# Patient Record
Sex: Female | Born: 1980 | Race: White | Hispanic: No | Marital: Single | Smoking: Current every day smoker
Health system: Southern US, Community
[De-identification: ages and names within clinical notes are randomized; demographics above are authoritative.]

## PROBLEM LIST (undated history)

## (undated) ENCOUNTER — Ambulatory Visit

## (undated) ENCOUNTER — Encounter
Attending: Student in an Organized Health Care Education/Training Program | Primary: Student in an Organized Health Care Education/Training Program

## (undated) ENCOUNTER — Encounter

## (undated) ENCOUNTER — Encounter: Attending: Internal Medicine | Primary: Internal Medicine

## (undated) ENCOUNTER — Telehealth

## (undated) ENCOUNTER — Telehealth
Attending: Student in an Organized Health Care Education/Training Program | Primary: Student in an Organized Health Care Education/Training Program

## (undated) ENCOUNTER — Encounter: Attending: School | Primary: School

## (undated) ENCOUNTER — Encounter: Attending: Hematology & Oncology | Primary: Hematology & Oncology

## (undated) ENCOUNTER — Ambulatory Visit: Payer: BLUE CROSS/BLUE SHIELD

## (undated) ENCOUNTER — Ambulatory Visit: Payer: Medicaid (Managed Care) | Attending: Hematology & Oncology | Primary: Hematology & Oncology

## (undated) ENCOUNTER — Ambulatory Visit: Payer: MEDICAID | Attending: Clinical | Primary: Clinical

## (undated) ENCOUNTER — Telehealth: Attending: Hematology & Oncology | Primary: Hematology & Oncology

## (undated) ENCOUNTER — Ambulatory Visit: Payer: MEDICAID

## (undated) ENCOUNTER — Telehealth: Attending: Internal Medicine | Primary: Internal Medicine

## (undated) ENCOUNTER — Ambulatory Visit
Payer: Medicaid (Managed Care) | Attending: Student in an Organized Health Care Education/Training Program | Primary: Student in an Organized Health Care Education/Training Program

## (undated) ENCOUNTER — Ambulatory Visit: Payer: BLUE CROSS/BLUE SHIELD | Attending: School | Primary: School

## (undated) ENCOUNTER — Telehealth: Attending: Clinical | Primary: Clinical

## (undated) ENCOUNTER — Ambulatory Visit: Attending: Psychiatry | Primary: Psychiatry

## (undated) ENCOUNTER — Telehealth: Attending: Social Worker | Primary: Social Worker

## (undated) ENCOUNTER — Encounter: Attending: Gastroenterology | Primary: Gastroenterology

## (undated) ENCOUNTER — Ambulatory Visit: Payer: Worker's Compensation

## (undated) ENCOUNTER — Encounter: Payer: Medicaid (Managed Care) | Attending: School | Primary: School

## (undated) ENCOUNTER — Encounter: Attending: Family | Primary: Family

## (undated) ENCOUNTER — Inpatient Hospital Stay: Payer: Medicaid (Managed Care)

## (undated) ENCOUNTER — Encounter: Attending: Physician Assistant | Primary: Physician Assistant

## (undated) ENCOUNTER — Ambulatory Visit: Attending: Pharmacist | Primary: Pharmacist

## (undated) ENCOUNTER — Encounter: Attending: Dermatology | Primary: Dermatology

## (undated) ENCOUNTER — Encounter: Attending: Registered" | Primary: Registered"

## (undated) ENCOUNTER — Ambulatory Visit: Payer: Medicaid (Managed Care) | Attending: Internal Medicine | Primary: Internal Medicine

## (undated) ENCOUNTER — Encounter: Attending: Surgery | Primary: Surgery

## (undated) ENCOUNTER — Ambulatory Visit: Payer: Medicaid (Managed Care) | Attending: School | Primary: School

## (undated) ENCOUNTER — Encounter: Attending: Registered Nurse | Primary: Registered Nurse

## (undated) ENCOUNTER — Ambulatory Visit: Payer: MEDICAID | Attending: Internal Medicine | Primary: Internal Medicine

## (undated) ENCOUNTER — Ambulatory Visit: Payer: Medicaid (Managed Care)

## (undated) ENCOUNTER — Ambulatory Visit: Payer: Medicaid (Managed Care) | Attending: Nurse Practitioner | Primary: Nurse Practitioner

## (undated) ENCOUNTER — Ambulatory Visit: Attending: Internal Medicine | Primary: Internal Medicine

## (undated) ENCOUNTER — Telehealth: Attending: Mental Health | Primary: Mental Health

## (undated) ENCOUNTER — Encounter: Attending: Orthopaedic Trauma | Primary: Orthopaedic Trauma

## (undated) ENCOUNTER — Ambulatory Visit
Payer: MEDICAID | Attending: Student in an Organized Health Care Education/Training Program | Primary: Student in an Organized Health Care Education/Training Program

## (undated) ENCOUNTER — Telehealth: Attending: Family | Primary: Family

## (undated) ENCOUNTER — Encounter: Attending: Clinical | Primary: Clinical

## (undated) ENCOUNTER — Encounter: Attending: Anesthesiology | Primary: Anesthesiology

## (undated) ENCOUNTER — Encounter: Attending: Nurse Practitioner | Primary: Nurse Practitioner

## (undated) ENCOUNTER — Telehealth: Attending: School | Primary: School

## (undated) ENCOUNTER — Telehealth: Attending: Nurse Practitioner | Primary: Nurse Practitioner

## (undated) ENCOUNTER — Telehealth: Attending: Marriage & Family Therapist | Primary: Marriage & Family Therapist

## (undated) ENCOUNTER — Ambulatory Visit
Payer: BLUE CROSS/BLUE SHIELD | Attending: Student in an Organized Health Care Education/Training Program | Primary: Student in an Organized Health Care Education/Training Program

## (undated) ENCOUNTER — Ambulatory Visit: Payer: BLUE CROSS/BLUE SHIELD | Attending: Clinical | Primary: Clinical

## (undated) ENCOUNTER — Encounter: Attending: Professional | Primary: Professional

## (undated) ENCOUNTER — Encounter: Attending: Rheumatology | Primary: Rheumatology

## (undated) ENCOUNTER — Ambulatory Visit: Payer: BLUE CROSS/BLUE SHIELD | Attending: Hematology & Oncology | Primary: Hematology & Oncology

## (undated) ENCOUNTER — Encounter: Attending: Marriage & Family Therapist | Primary: Marriage & Family Therapist

## (undated) ENCOUNTER — Inpatient Hospital Stay

## (undated) ENCOUNTER — Telehealth
Payer: BLUE CROSS/BLUE SHIELD | Attending: Student in an Organized Health Care Education/Training Program | Primary: Student in an Organized Health Care Education/Training Program

## (undated) ENCOUNTER — Ambulatory Visit: Payer: BLUE CROSS/BLUE SHIELD | Attending: Psychologist | Primary: Psychologist

## (undated) ENCOUNTER — Ambulatory Visit: Payer: BLUE CROSS/BLUE SHIELD | Attending: Professional | Primary: Professional

## (undated) ENCOUNTER — Ambulatory Visit: Payer: BLUE CROSS/BLUE SHIELD | Attending: Physician Assistant | Primary: Physician Assistant

## (undated) ENCOUNTER — Ambulatory Visit
Attending: Student in an Organized Health Care Education/Training Program | Primary: Student in an Organized Health Care Education/Training Program

## (undated) ENCOUNTER — Ambulatory Visit: Attending: School | Primary: School

## (undated) ENCOUNTER — Telehealth: Attending: Physician Assistant | Primary: Physician Assistant

## (undated) ENCOUNTER — Inpatient Hospital Stay: Payer: Worker's Compensation

## (undated) ENCOUNTER — Ambulatory Visit: Payer: MEDICAID | Attending: Surgery | Primary: Surgery

## (undated) DIAGNOSIS — L259 Unspecified contact dermatitis, unspecified cause: Secondary | ICD-10-CM

## (undated) DIAGNOSIS — F988 Other specified behavioral and emotional disorders with onset usually occurring in childhood and adolescence: Secondary | ICD-10-CM

## (undated) DIAGNOSIS — J189 Pneumonia, unspecified organism: Secondary | ICD-10-CM

## (undated) DIAGNOSIS — M797 Fibromyalgia: Secondary | ICD-10-CM

## (undated) DIAGNOSIS — K5190 Ulcerative colitis, unspecified, without complications: Secondary | ICD-10-CM

## (undated) DIAGNOSIS — F411 Generalized anxiety disorder: Secondary | ICD-10-CM

## (undated) DIAGNOSIS — G8929 Other chronic pain: Secondary | ICD-10-CM

## (undated) DIAGNOSIS — F909 Attention-deficit hyperactivity disorder, unspecified type: Secondary | ICD-10-CM

## (undated) DIAGNOSIS — I10 Essential (primary) hypertension: Secondary | ICD-10-CM

## (undated) DIAGNOSIS — M76899 Other specified enthesopathies of unspecified lower limb, excluding foot: Secondary | ICD-10-CM

## (undated) DIAGNOSIS — E519 Thiamine deficiency, unspecified: Secondary | ICD-10-CM

## (undated) DIAGNOSIS — D649 Anemia, unspecified: Secondary | ICD-10-CM

## (undated) DIAGNOSIS — G43009 Migraine without aura, not intractable, without status migrainosus: Secondary | ICD-10-CM

## (undated) DIAGNOSIS — M542 Cervicalgia: Secondary | ICD-10-CM

## (undated) DIAGNOSIS — H6690 Otitis media, unspecified, unspecified ear: Secondary | ICD-10-CM

## (undated) DIAGNOSIS — M1990 Unspecified osteoarthritis, unspecified site: Secondary | ICD-10-CM

## (undated) DIAGNOSIS — F17200 Nicotine dependence, unspecified, uncomplicated: Secondary | ICD-10-CM

## (undated) DIAGNOSIS — Z8719 Personal history of other diseases of the digestive system: Secondary | ICD-10-CM

## (undated) DIAGNOSIS — R109 Unspecified abdominal pain: Secondary | ICD-10-CM

## (undated) DIAGNOSIS — R51 Headache: Secondary | ICD-10-CM

## (undated) DIAGNOSIS — J40 Bronchitis, not specified as acute or chronic: Secondary | ICD-10-CM

## (undated) DIAGNOSIS — L719 Rosacea, unspecified: Secondary | ICD-10-CM

## (undated) DIAGNOSIS — E538 Deficiency of other specified B group vitamins: Secondary | ICD-10-CM

## (undated) DIAGNOSIS — L03119 Cellulitis of unspecified part of limb: Secondary | ICD-10-CM

## (undated) DIAGNOSIS — K219 Gastro-esophageal reflux disease without esophagitis: Secondary | ICD-10-CM

## (undated) DIAGNOSIS — L02419 Cutaneous abscess of limb, unspecified: Secondary | ICD-10-CM

## (undated) DIAGNOSIS — R062 Wheezing: Secondary | ICD-10-CM

## (undated) DIAGNOSIS — J309 Allergic rhinitis, unspecified: Secondary | ICD-10-CM

## (undated) DIAGNOSIS — R5383 Other fatigue: Secondary | ICD-10-CM

## (undated) DIAGNOSIS — M549 Dorsalgia, unspecified: Secondary | ICD-10-CM

## (undated) DIAGNOSIS — K5000 Crohn's disease of small intestine without complications: Secondary | ICD-10-CM

## (undated) HISTORY — DX: Ulcerative colitis, unspecified, without complications: K51.90

## (undated) HISTORY — DX: Other specified enthesopathies of unspecified lower limb, excluding foot: M76.899

## (undated) HISTORY — DX: Generalized anxiety disorder: F41.1

## (undated) HISTORY — DX: Deficiency of other specified B group vitamins: E53.8

## (undated) HISTORY — DX: Other fatigue: R53.83

## (undated) HISTORY — DX: Cutaneous abscess of limb, unspecified: L02.419

## (undated) HISTORY — DX: Unspecified contact dermatitis, unspecified cause: L25.9

## (undated) HISTORY — DX: Gastro-esophageal reflux disease without esophagitis: K21.9

## (undated) HISTORY — DX: Nicotine dependence, unspecified, uncomplicated: F17.200

## (undated) HISTORY — DX: Cervicalgia: M54.2

## (undated) HISTORY — DX: Unspecified abdominal pain: R10.9

## (undated) HISTORY — DX: Rosacea, unspecified: L71.9

## (undated) HISTORY — DX: Cellulitis of unspecified part of limb: L03.119

## (undated) HISTORY — DX: Dorsalgia, unspecified: M54.9

## (undated) HISTORY — DX: Wheezing: R06.2

## (undated) HISTORY — DX: Thiamine deficiency, unspecified: E51.9

## (undated) HISTORY — DX: Other chronic pain: G89.29

## (undated) HISTORY — DX: Essential (primary) hypertension: I10

## (undated) HISTORY — DX: Other specified behavioral and emotional disorders with onset usually occurring in childhood and adolescence: F98.8

## (undated) HISTORY — DX: Otitis media, unspecified, unspecified ear: H66.90

## (undated) HISTORY — DX: Headache: R51

## (undated) HISTORY — DX: Migraine without aura, not intractable, without status migrainosus: G43.009

## (undated) HISTORY — DX: Attention-deficit hyperactivity disorder, unspecified type: F90.9

## (undated) HISTORY — DX: Allergic rhinitis, unspecified: J30.9

## (undated) HISTORY — DX: Crohn's disease of small intestine without complications: K50.00

## (undated) MED ORDER — ASPIRIN-ACETAMINOPHEN-CAFFEINE 250 MG-250 MG-65 MG TABLET: Freq: Four times a day (QID) | ORAL | 0 days | PRN

## (undated) MED ORDER — MAGNESIUM OXIDE 400 MG (241.3 MG MAGNESIUM) TABLET: ORAL | 0.00000 days

## (undated) MED ORDER — BACLOFEN 20 MG TABLET: Freq: Three times a day (TID) | ORAL | 0.00000 days

## (undated) MED ORDER — TETRAHYDROZOLINE 0.05 %-ZINC 0.25 % EYE DROPS: Freq: Once | 0.00000 days | PRN

## (undated) MED ORDER — PRAMIPEXOLE 1 MG TABLET: Freq: Every evening | ORAL | 0 days

## (undated) MED ORDER — LEVETIRACETAM 500 MG TABLET: Freq: Two times a day (BID) | ORAL | 0 days

## (undated) MED ORDER — DEXTROAMPHETAMINE-AMPHETAMINE 20 MG TABLET: 20 mg | tablet | Freq: Two times a day (BID) | 0 refills | 30 days | Status: CN

## (undated) MED ORDER — CYCLOBENZAPRINE 10 MG TABLET: Freq: Three times a day (TID) | ORAL | 0.00000 days | PRN

## (undated) MED ORDER — OXYCODONE 5 MG CAPSULE: Freq: Three times a day (TID) | ORAL | 0 days | PRN

---
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
## 2000-11-21 ENCOUNTER — Encounter: Admission: RE | Admit: 2000-11-21 | Discharge: 2000-11-21 | Payer: Self-pay | Admitting: Specialist

## 2000-11-21 ENCOUNTER — Encounter: Payer: Self-pay | Admitting: Specialist

## 2000-11-23 ENCOUNTER — Encounter: Admission: RE | Admit: 2000-11-23 | Discharge: 2000-11-23 | Payer: Self-pay | Admitting: Specialist

## 2000-11-23 ENCOUNTER — Encounter: Payer: Self-pay | Admitting: Specialist

## 2001-03-02 ENCOUNTER — Encounter: Admission: RE | Admit: 2001-03-02 | Discharge: 2001-05-31 | Payer: Self-pay | Admitting: Family Medicine

## 2001-08-22 ENCOUNTER — Encounter (INDEPENDENT_AMBULATORY_CARE_PROVIDER_SITE_OTHER): Payer: Self-pay | Admitting: Specialist

## 2001-08-22 ENCOUNTER — Encounter: Payer: Self-pay | Admitting: Gastroenterology

## 2001-08-22 ENCOUNTER — Ambulatory Visit (HOSPITAL_COMMUNITY): Admission: RE | Admit: 2001-08-22 | Discharge: 2001-08-22 | Payer: Self-pay | Admitting: Gastroenterology

## 2002-08-08 HISTORY — PX: AUGMENTATION MAMMAPLASTY: SUR837

## 2003-06-09 ENCOUNTER — Other Ambulatory Visit: Admission: RE | Admit: 2003-06-09 | Discharge: 2003-06-09 | Payer: Self-pay | Admitting: Obstetrics and Gynecology

## 2005-07-18 ENCOUNTER — Emergency Department (HOSPITAL_COMMUNITY): Admission: EM | Admit: 2005-07-18 | Discharge: 2005-07-18 | Payer: Self-pay | Admitting: Emergency Medicine

## 2008-01-30 ENCOUNTER — Emergency Department (HOSPITAL_COMMUNITY): Admission: EM | Admit: 2008-01-30 | Discharge: 2008-01-31 | Payer: Self-pay | Admitting: Emergency Medicine

## 2008-01-31 ENCOUNTER — Emergency Department (HOSPITAL_COMMUNITY): Admission: EM | Admit: 2008-01-31 | Discharge: 2008-01-31 | Payer: Self-pay | Admitting: Emergency Medicine

## 2008-01-31 IMAGING — CT CT HEAD W/O CM
1 series · 16 of 30 positions shown, 20 images · non-contrast
Comparison: None

CLINICAL DATA: Left facial numbness

CT HEAD WITHOUT CONTRAST
TECHNIQUE: Contiguous axial images were obtained from the base of
the skull through the vertex without contrast.

[Series 2: headseq 4.8 h37s · axial · 0.45mm/px · z∈[+140,+300]mm · 16 of 36 slices shown, 20 images]
[im 2/36  brain]
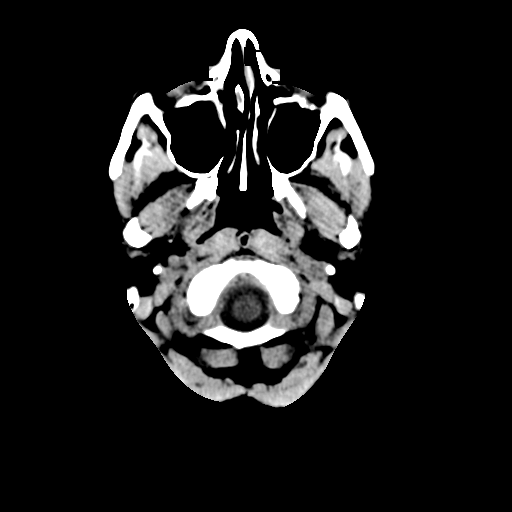
[im 2/36  bone]
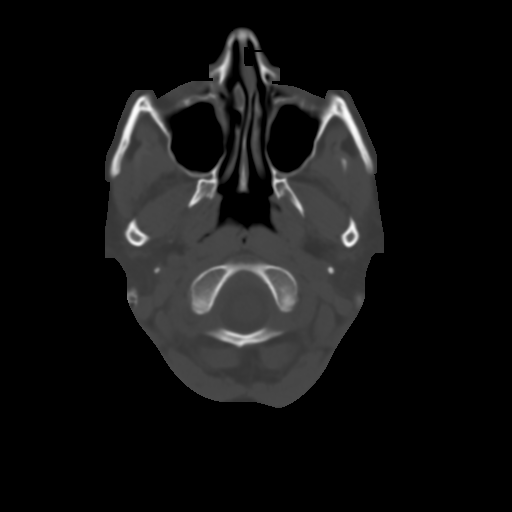
[im 4/36  brain]
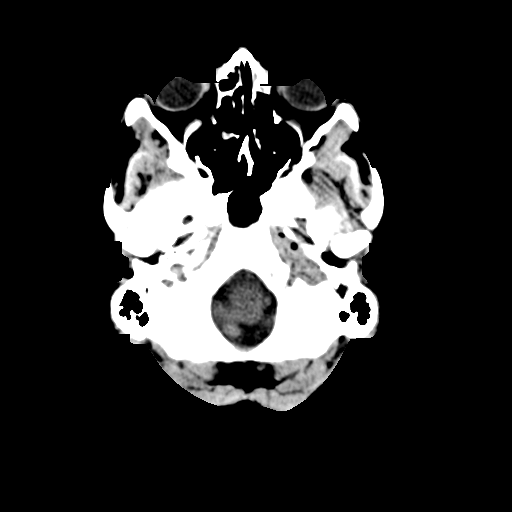
[im 7/36  brain]
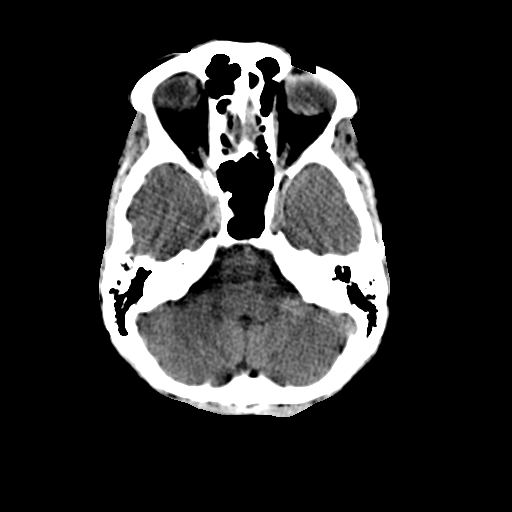
[im 9/36  brain]
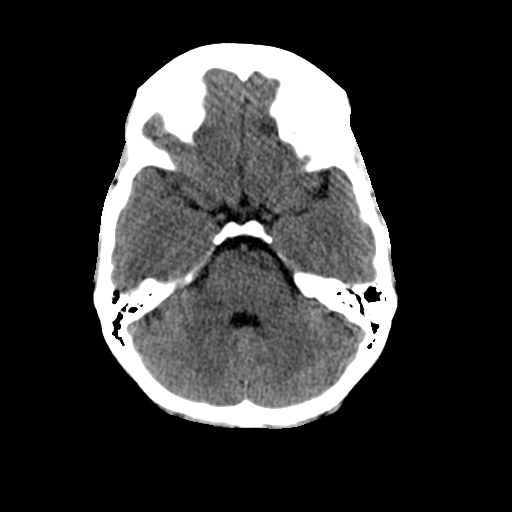
[im 10/36  brain]
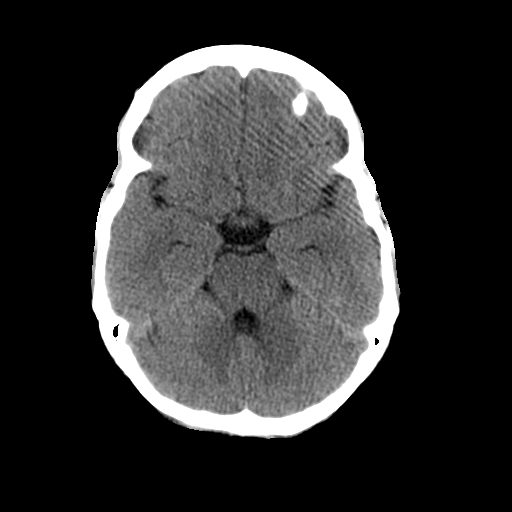
[im 10/36  bone]
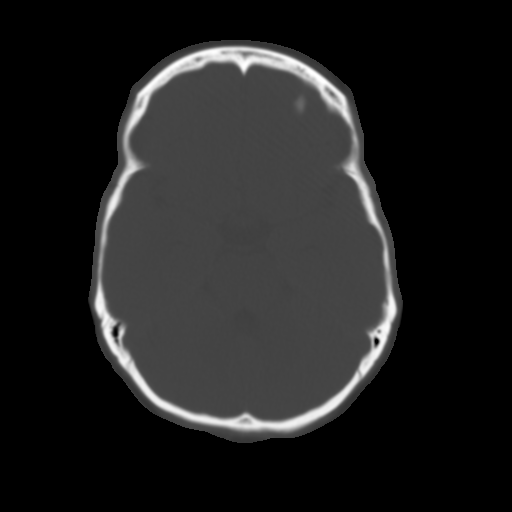
[im 13/36  brain]
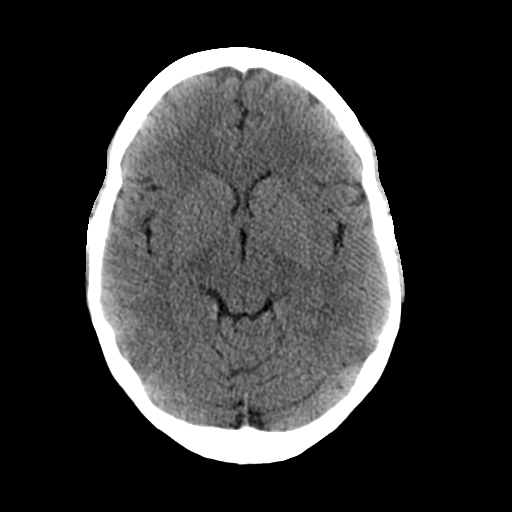
[im 15/36  brain]
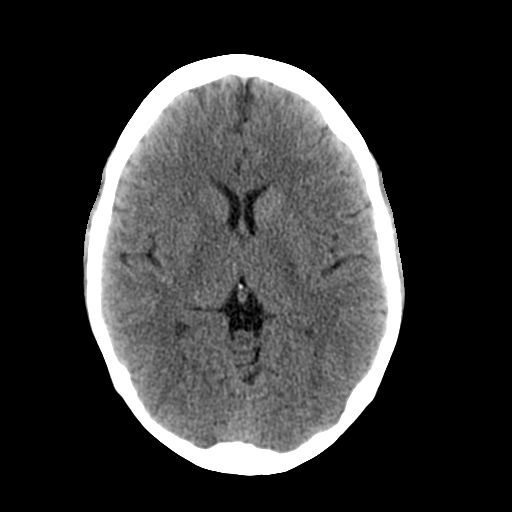
[im 17/36  brain]
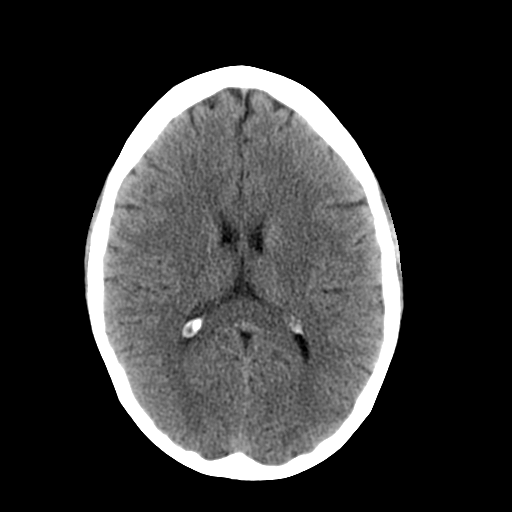
[im 19/36  brain]
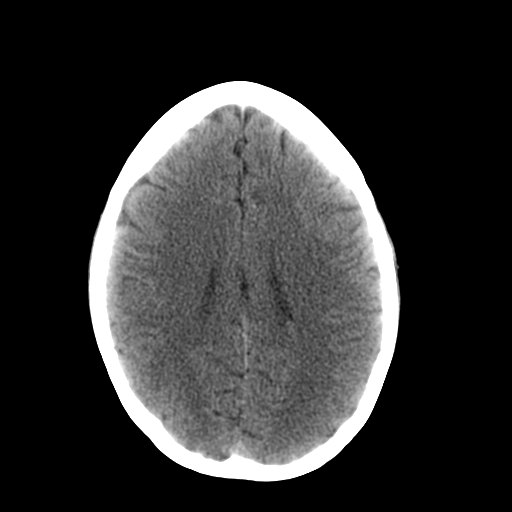
[im 19/36  bone]
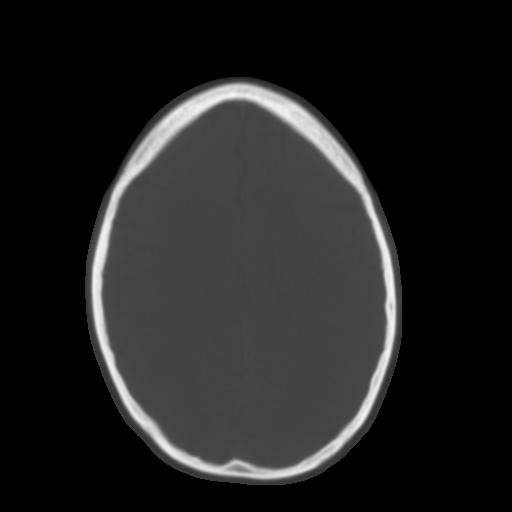
[im 21/36  brain]
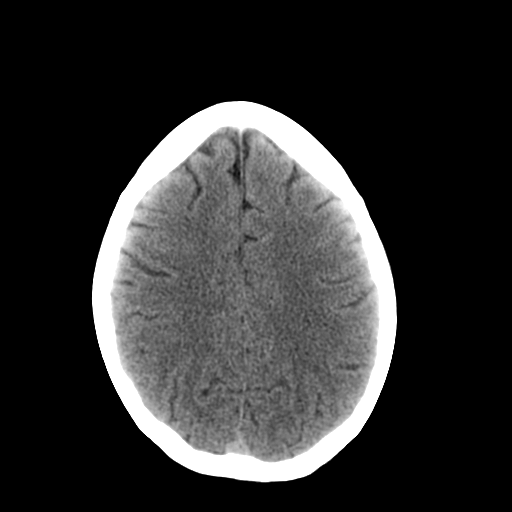
[im 23/36  brain]
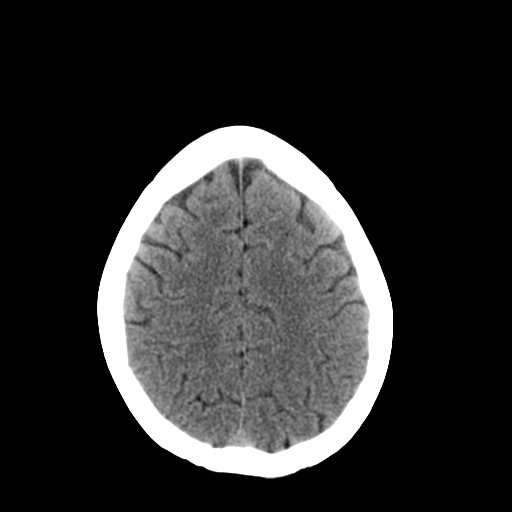
[im 26/36  brain]
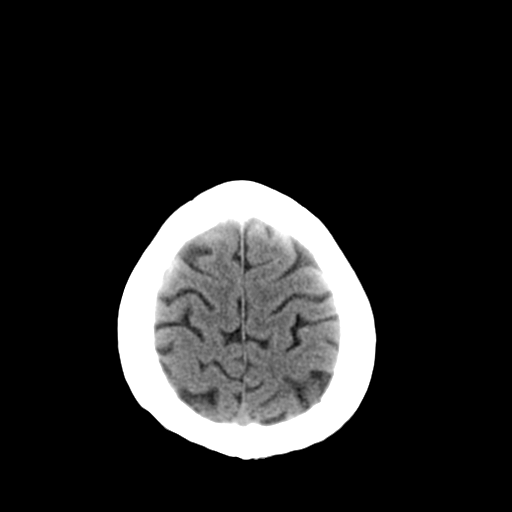
[im 27/36  brain]
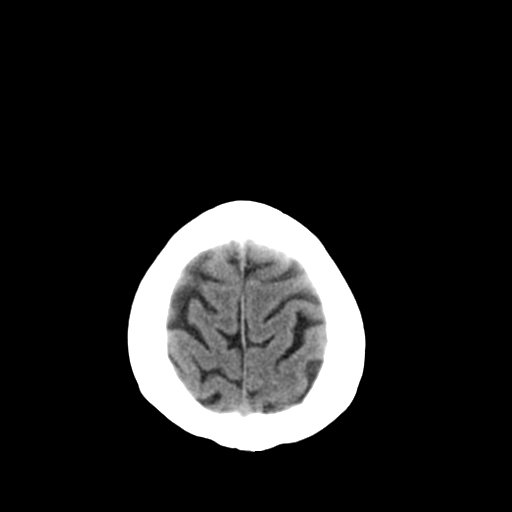
[im 27/36  bone]
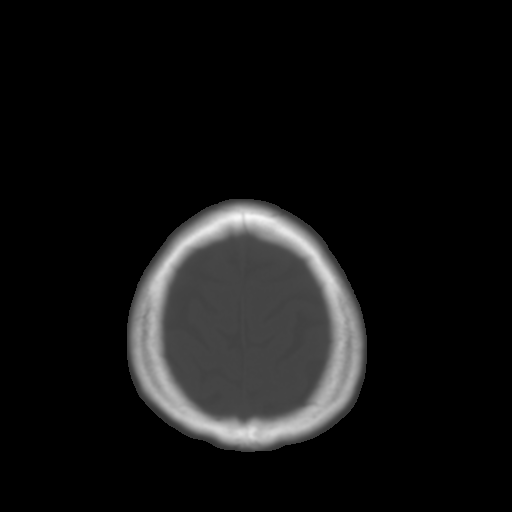
[im 29/36  brain]
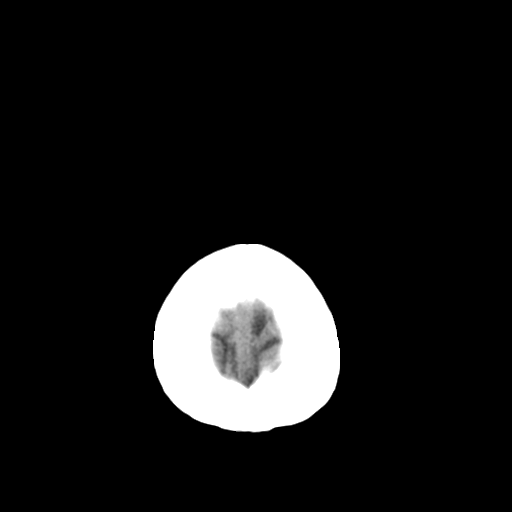
[im 32/36  brain]
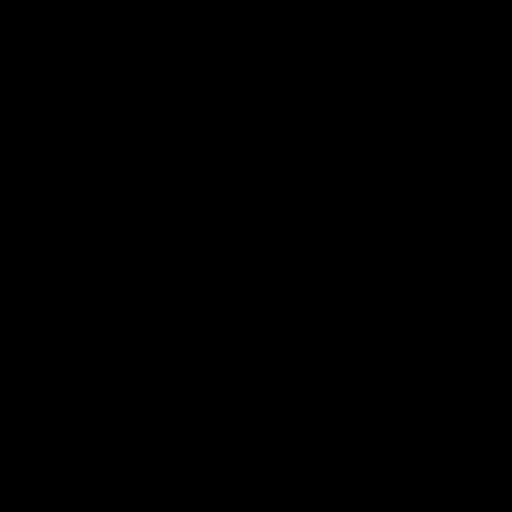
[im 34/36  brain]
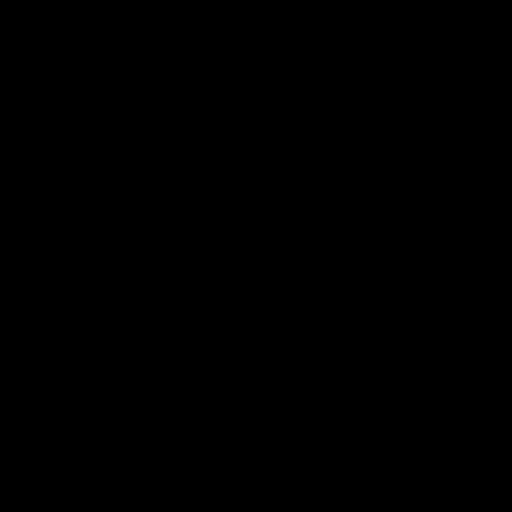

[16 of 30 positions shown; findings below may reference images not displayed]

FINDINGS: The ventricles are in the midline without mass effect or
shift.  They are normal in size and configuration.  No extra-axial
fluid collections are seen.  No acute intracranial findings and no
mass lesions.  The brainstem and cerebellum are unremarkable.

The bony calvarium is intact.  The visualized paranasal sinuses and
mastoid air cells are grossly clear.  Minimal ethmoid disease.  The
globes are intact.
IMPRESSION: 1.  No acute intracranial findings and no mass lesions.
2.  Intact bony calvarium.

## 2008-11-07 ENCOUNTER — Inpatient Hospital Stay (HOSPITAL_COMMUNITY): Admission: AD | Admit: 2008-11-07 | Discharge: 2008-11-09 | Payer: Self-pay | Admitting: Obstetrics & Gynecology

## 2009-01-06 HISTORY — PX: ENDOMETRIAL ABLATION: SHX621

## 2009-01-18 ENCOUNTER — Emergency Department (HOSPITAL_COMMUNITY): Admission: EM | Admit: 2009-01-18 | Discharge: 2009-01-18 | Payer: Self-pay | Admitting: Family Medicine

## 2009-02-03 ENCOUNTER — Ambulatory Visit (HOSPITAL_COMMUNITY): Admission: RE | Admit: 2009-02-03 | Discharge: 2009-02-03 | Payer: Self-pay | Admitting: Obstetrics and Gynecology

## 2009-03-19 ENCOUNTER — Emergency Department (HOSPITAL_COMMUNITY): Admission: EM | Admit: 2009-03-19 | Discharge: 2009-03-19 | Payer: Self-pay | Admitting: Emergency Medicine

## 2009-03-19 IMAGING — CT CT ABDOMEN W/ CM
2 of 4 series · 17 of 46 positions shown, 19 images · IV contrast (water/omni  & 80 ml omni 300)
Comparison: None

CT ABDOMEN

CLINICAL DATA: Mid abdominal pain, nausea, vomiting, diarrhea for
approximately 4 months

CT ABDOMEN AND PELVIS WITH CONTRAST
TECHNIQUE: Multidetector CT imaging of the abdomen and pelvis was
performed using the standard protocol following bolus
administration of intravenous contrast.
Contrast: 80 ml [73]

[Series 2: routine abdomen · axial · 0.71mm/px · z∈[-445,-15]mm · 14 of 96 slices shown, 16 images]
[im 5/96  soft-tissue]
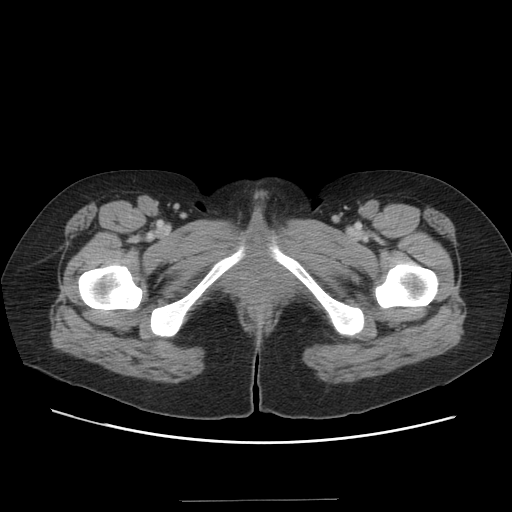
[im 5/96  bone]
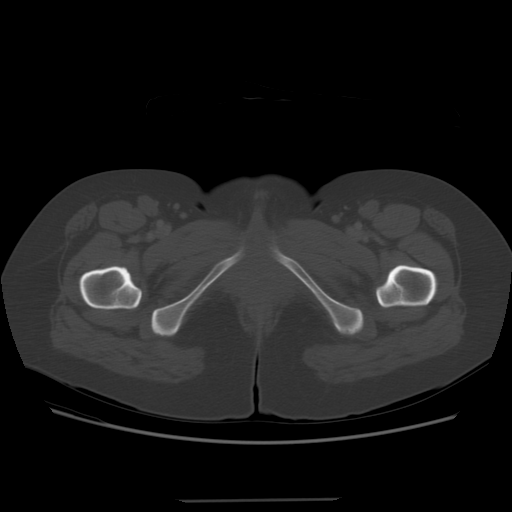
[im 13/96  soft-tissue]
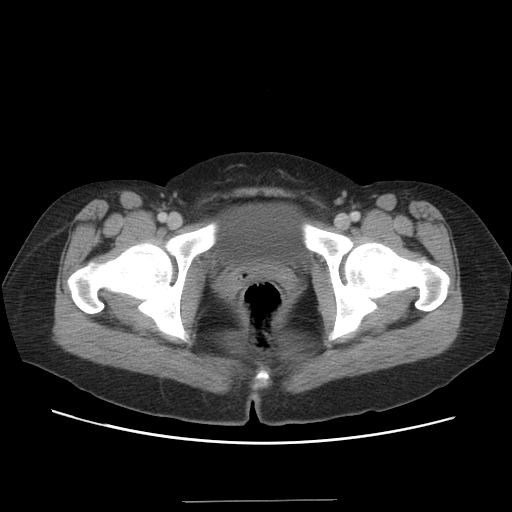
[im 18/96  soft-tissue]
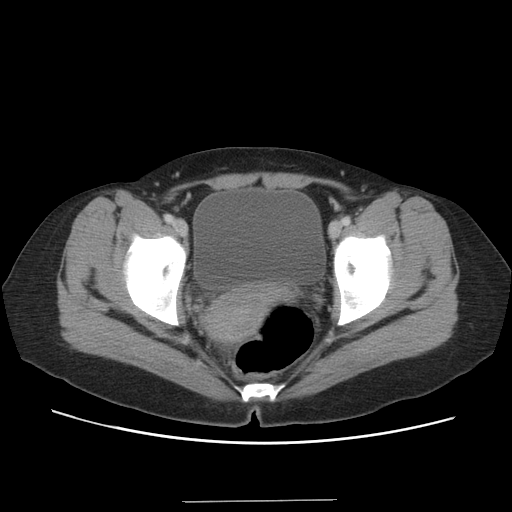
[im 26/96  soft-tissue]
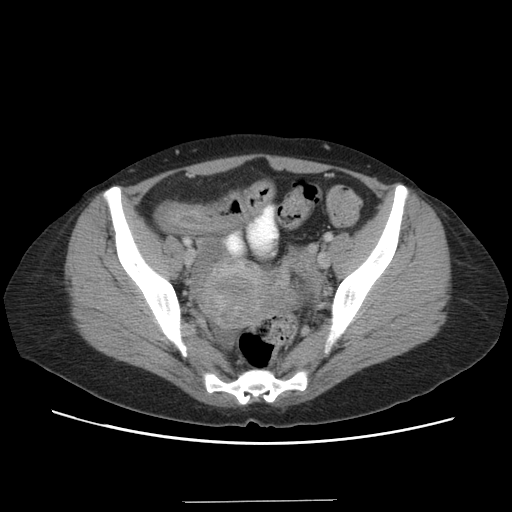
[im 31/96  soft-tissue]
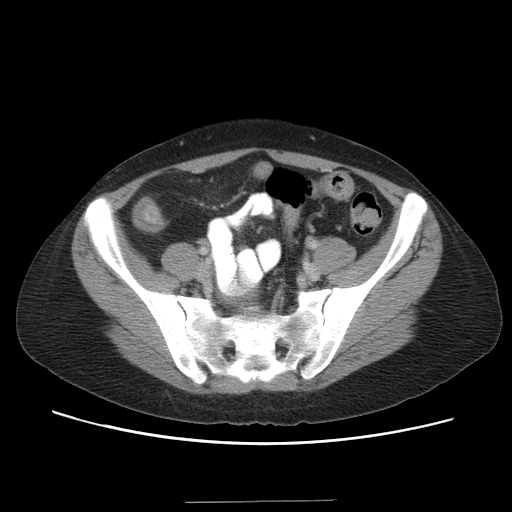
[im 39/96  soft-tissue]
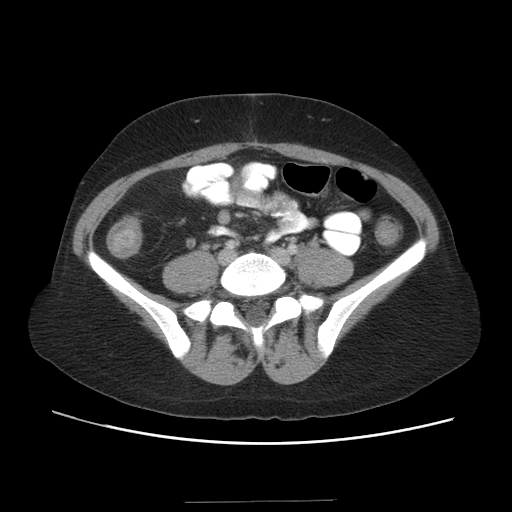
[im 44/96  soft-tissue]
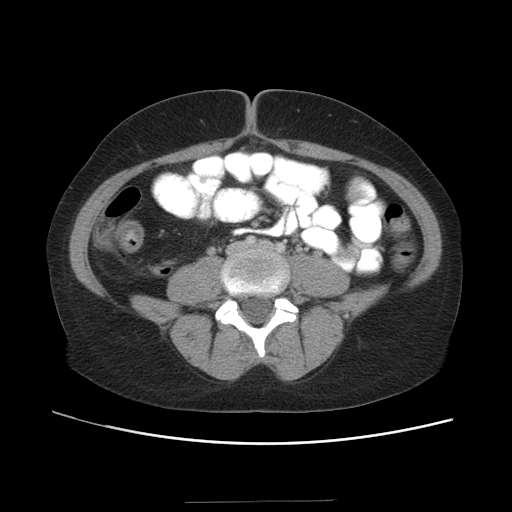
[im 52/96  soft-tissue]
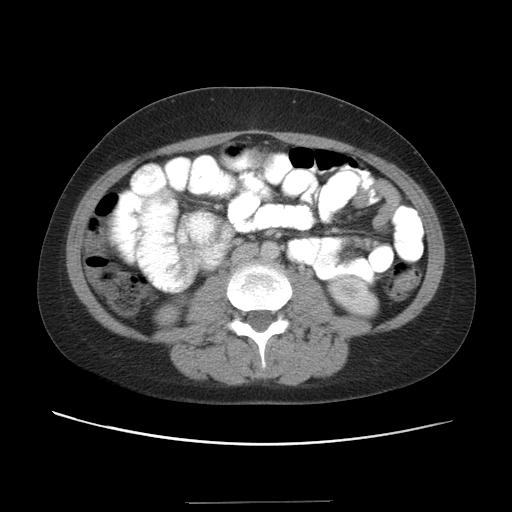
[im 57/96  soft-tissue]
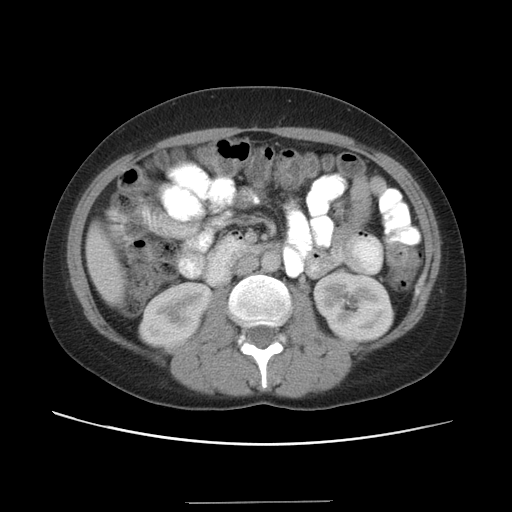
[im 57/96  bone]
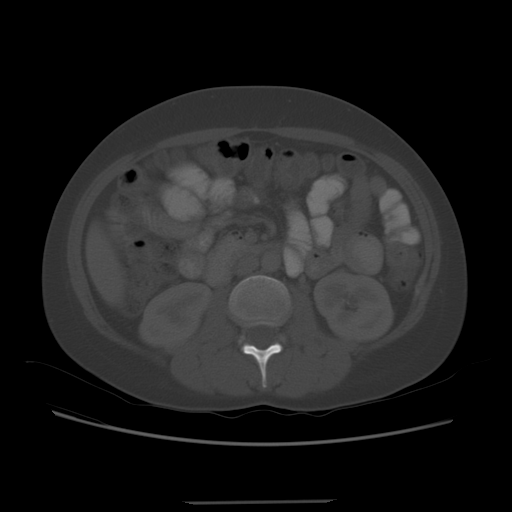
[im 65/96  soft-tissue]
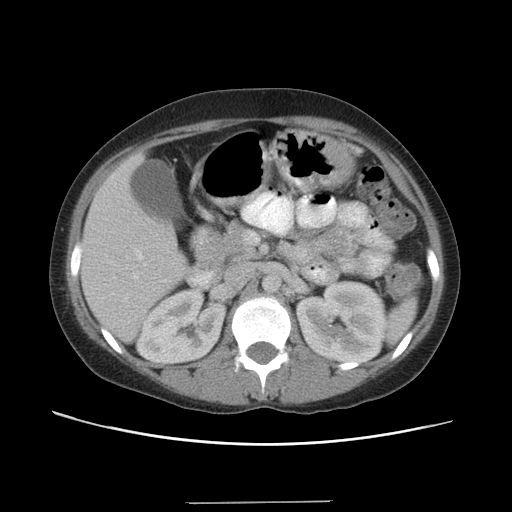
[im 70/96  soft-tissue]
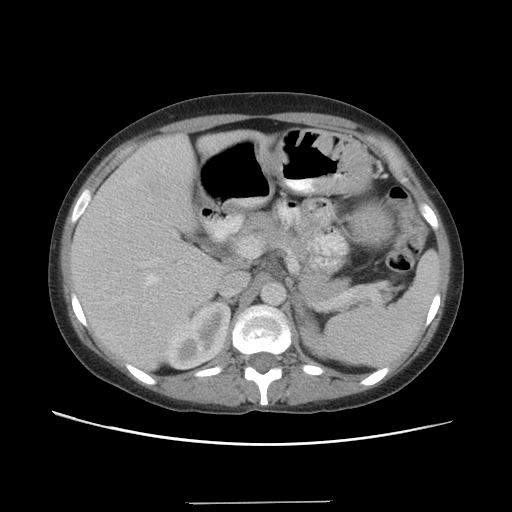
[im 78/96  soft-tissue]
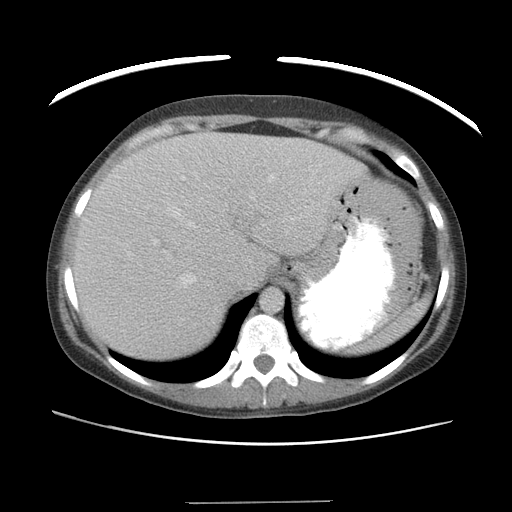
[im 83/96  soft-tissue]
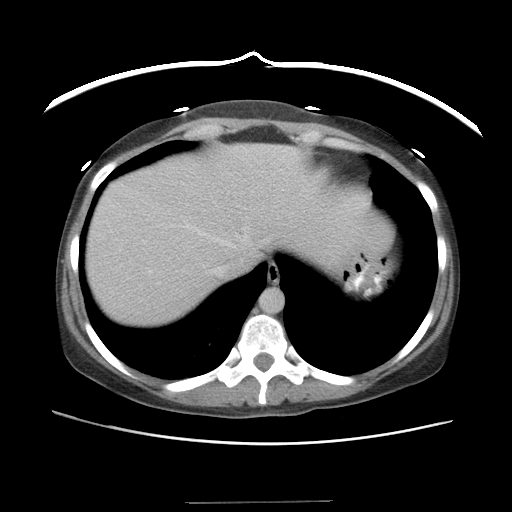
[im 91/96  soft-tissue]
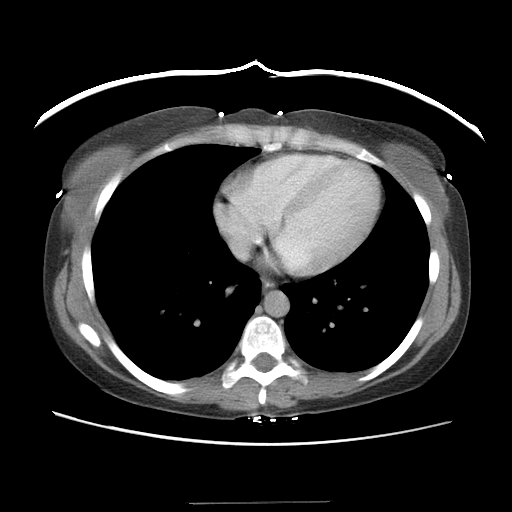

[Series 401: cor · coronal · 0.95mm/px · 3 of 81 slices shown]
[im 27/81  soft-tissue]
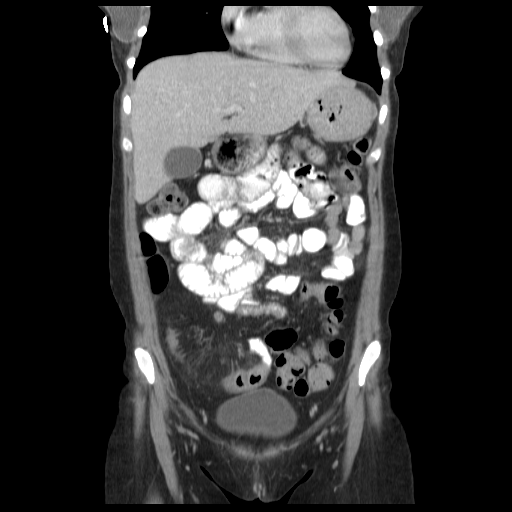
[im 36/81  soft-tissue]
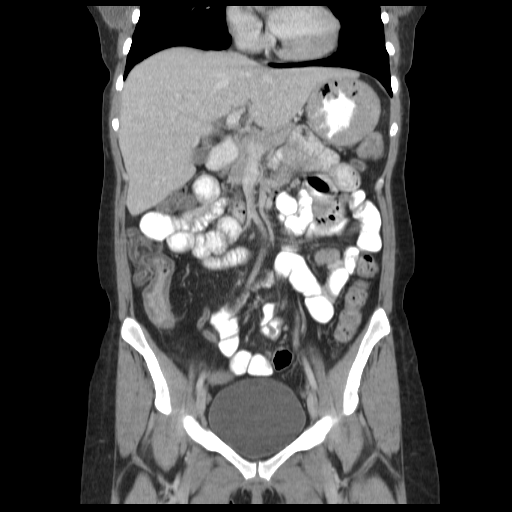
[im 45/81  soft-tissue]
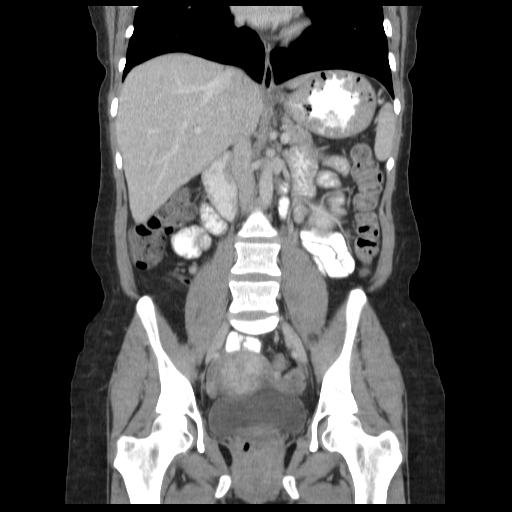

[17 of 46 positions shown; findings below may reference images not displayed]

FINDINGS: The lung bases are clear.  Bilateral breast implants are
noted.  The liver enhances with no focal abnormality and no ductal
dilatation is seen.  No calcified gallstones are noted.  The
pancreas is normal in size and the pancreatic duct is not dilated.
The adrenal glands and spleen appear normal.  The stomach is not
well distended.  There are small bilateral renal calculi present
but no hydronephrosis is seen.  The proximal ureters are normal in
caliber.  The abdominal aorta is normal in caliber.  No adenopathy
is seen.
IMPRESSION: Small bilateral nonobstructing renal calculi.  No hydronephrosis.

CT PELVIS
FINDINGS: There is mucosal thickening of the distal and terminal
ileum most consistent with inflammatory bowel disease such as
Crohn's disease.  No abscess is seen.  There are a few prominent
nodes adjacent to the distal and terminal ileum.  The appendix is
well seen and appears normal.  The urinary bladder is unremarkable.
There are bilateral ovarian cysts left greater than right with a
small amount of free fluid in the cul-de-sac.  The uterus is
somewhat inhomogeneous in enhancement which may indicate the
presence of small fibroids.
IMPRESSION: 1.  Mucosal edema of the distal and terminal ileum most consistent
with changes of inflammatory bowel disease, such as Crohn's
disease.  No abscess is seen.
2.  Ovarian cysts, left larger than right with a small amount of
free fluid in cul-de-sac.
3.  Inhomogeneous enhancement of the uterus may indicate small
fibroids.

## 2009-03-20 ENCOUNTER — Telehealth: Payer: Self-pay | Admitting: Gastroenterology

## 2009-03-24 ENCOUNTER — Ambulatory Visit: Payer: Self-pay | Admitting: Gastroenterology

## 2009-03-24 ENCOUNTER — Telehealth (INDEPENDENT_AMBULATORY_CARE_PROVIDER_SITE_OTHER): Payer: Self-pay | Admitting: *Deleted

## 2009-03-24 DIAGNOSIS — I10 Essential (primary) hypertension: Secondary | ICD-10-CM | POA: Insufficient documentation

## 2009-03-24 DIAGNOSIS — F411 Generalized anxiety disorder: Secondary | ICD-10-CM | POA: Insufficient documentation

## 2009-03-24 DIAGNOSIS — R51 Headache: Secondary | ICD-10-CM | POA: Insufficient documentation

## 2009-03-24 DIAGNOSIS — R109 Unspecified abdominal pain: Secondary | ICD-10-CM

## 2009-03-24 DIAGNOSIS — R519 Headache, unspecified: Secondary | ICD-10-CM | POA: Insufficient documentation

## 2009-03-24 DIAGNOSIS — K219 Gastro-esophageal reflux disease without esophagitis: Secondary | ICD-10-CM

## 2009-03-24 HISTORY — DX: Generalized anxiety disorder: F41.1

## 2009-03-24 HISTORY — DX: Essential (primary) hypertension: I10

## 2009-03-24 HISTORY — DX: Unspecified abdominal pain: R10.9

## 2009-03-24 HISTORY — DX: Headache: R51

## 2009-03-24 HISTORY — DX: Gastro-esophageal reflux disease without esophagitis: K21.9

## 2009-04-01 ENCOUNTER — Encounter: Payer: Self-pay | Admitting: Gastroenterology

## 2009-04-01 ENCOUNTER — Ambulatory Visit: Payer: Self-pay | Admitting: Gastroenterology

## 2009-04-01 LAB — HM COLONOSCOPY

## 2009-04-06 ENCOUNTER — Encounter: Payer: Self-pay | Admitting: Gastroenterology

## 2009-04-07 ENCOUNTER — Ambulatory Visit: Payer: Self-pay | Admitting: Gastroenterology

## 2009-04-07 ENCOUNTER — Telehealth: Payer: Self-pay | Admitting: Gastroenterology

## 2009-04-07 LAB — CONVERTED CEMR LAB
ALT: 13 units/L (ref 0–35)
AST: 15 units/L (ref 0–37)
Albumin: 4.3 g/dL (ref 3.5–5.2)
Alkaline Phosphatase: 59 units/L (ref 39–117)
BUN: 6 mg/dL (ref 6–23)
Basophils Absolute: 0 10*3/uL (ref 0.0–0.1)
Basophils Relative: 0.1 % (ref 0.0–3.0)
Bilirubin, Direct: 0.2 mg/dL (ref 0.0–0.3)
CO2: 30 meq/L (ref 19–32)
Calcium: 9.4 mg/dL (ref 8.4–10.5)
Chloride: 105 meq/L (ref 96–112)
Creatinine, Ser: 0.5 mg/dL (ref 0.4–1.2)
Eosinophils Absolute: 0.3 10*3/uL (ref 0.0–0.7)
Eosinophils Relative: 1.7 % (ref 0.0–5.0)
Ferritin: 34.7 ng/mL (ref 10.0–291.0)
Folate: 10.9 ng/mL
GFR calc non Af Amer: 156.51 mL/min (ref >60)
Glucose, Bld: 107 mg/dL — ABNORMAL HIGH (ref 70–99)
HCT: 43.6 % (ref 36.0–46.0)
Hemoglobin: 15.5 g/dL — ABNORMAL HIGH (ref 12.0–15.0)
Iron: 59 ug/dL (ref 42–145)
Lymphocytes Relative: 13.1 % (ref 12.0–46.0)
Lymphs Abs: 2 10*3/uL (ref 0.7–4.0)
MCHC: 35.5 g/dL (ref 30.0–36.0)
MCV: 94.1 fL (ref 78.0–100.0)
Monocytes Absolute: 0.4 10*3/uL (ref 0.1–1.0)
Monocytes Relative: 2.3 % — ABNORMAL LOW (ref 3.0–12.0)
Neutro Abs: 12.9 10*3/uL — ABNORMAL HIGH (ref 1.4–7.7)
Neutrophils Relative %: 82.8 % — ABNORMAL HIGH (ref 43.0–77.0)
Platelets: 306 10*3/uL (ref 150.0–400.0)
Potassium: 2.8 meq/L — CL (ref 3.5–5.1)
RBC: 4.64 M/uL (ref 3.87–5.11)
RDW: 12.2 % (ref 11.5–14.6)
Saturation Ratios: 17.6 % — ABNORMAL LOW (ref 20.0–50.0)
Sed Rate: 9 mm/hr (ref 0–22)
Sodium: 143 meq/L (ref 135–145)
TSH: 1.97 microintl units/mL (ref 0.35–5.50)
Total Bilirubin: 1.2 mg/dL (ref 0.3–1.2)
Total Protein: 6.9 g/dL (ref 6.0–8.3)
Transferrin: 239.8 mg/dL (ref 212.0–360.0)
Vitamin B-12: 264 pg/mL (ref 211–911)
WBC: 15.6 10*3/uL — ABNORMAL HIGH (ref 4.5–10.5)

## 2009-04-07 IMAGING — CR DG ABDOMEN ACUTE W/ 1V CHEST
3 series · 3 of 3 positions shown · non-contrast
Comparison: none

CLINICAL DATA: Rule out TB and Crohn's disease.  Cough.  Smoker.
Diarrhea.

ACUTE ABDOMEN SERIES (ABDOMEN 2 VIEW & CHEST 1 VIEW)

[view not recorded (1 of 3)]
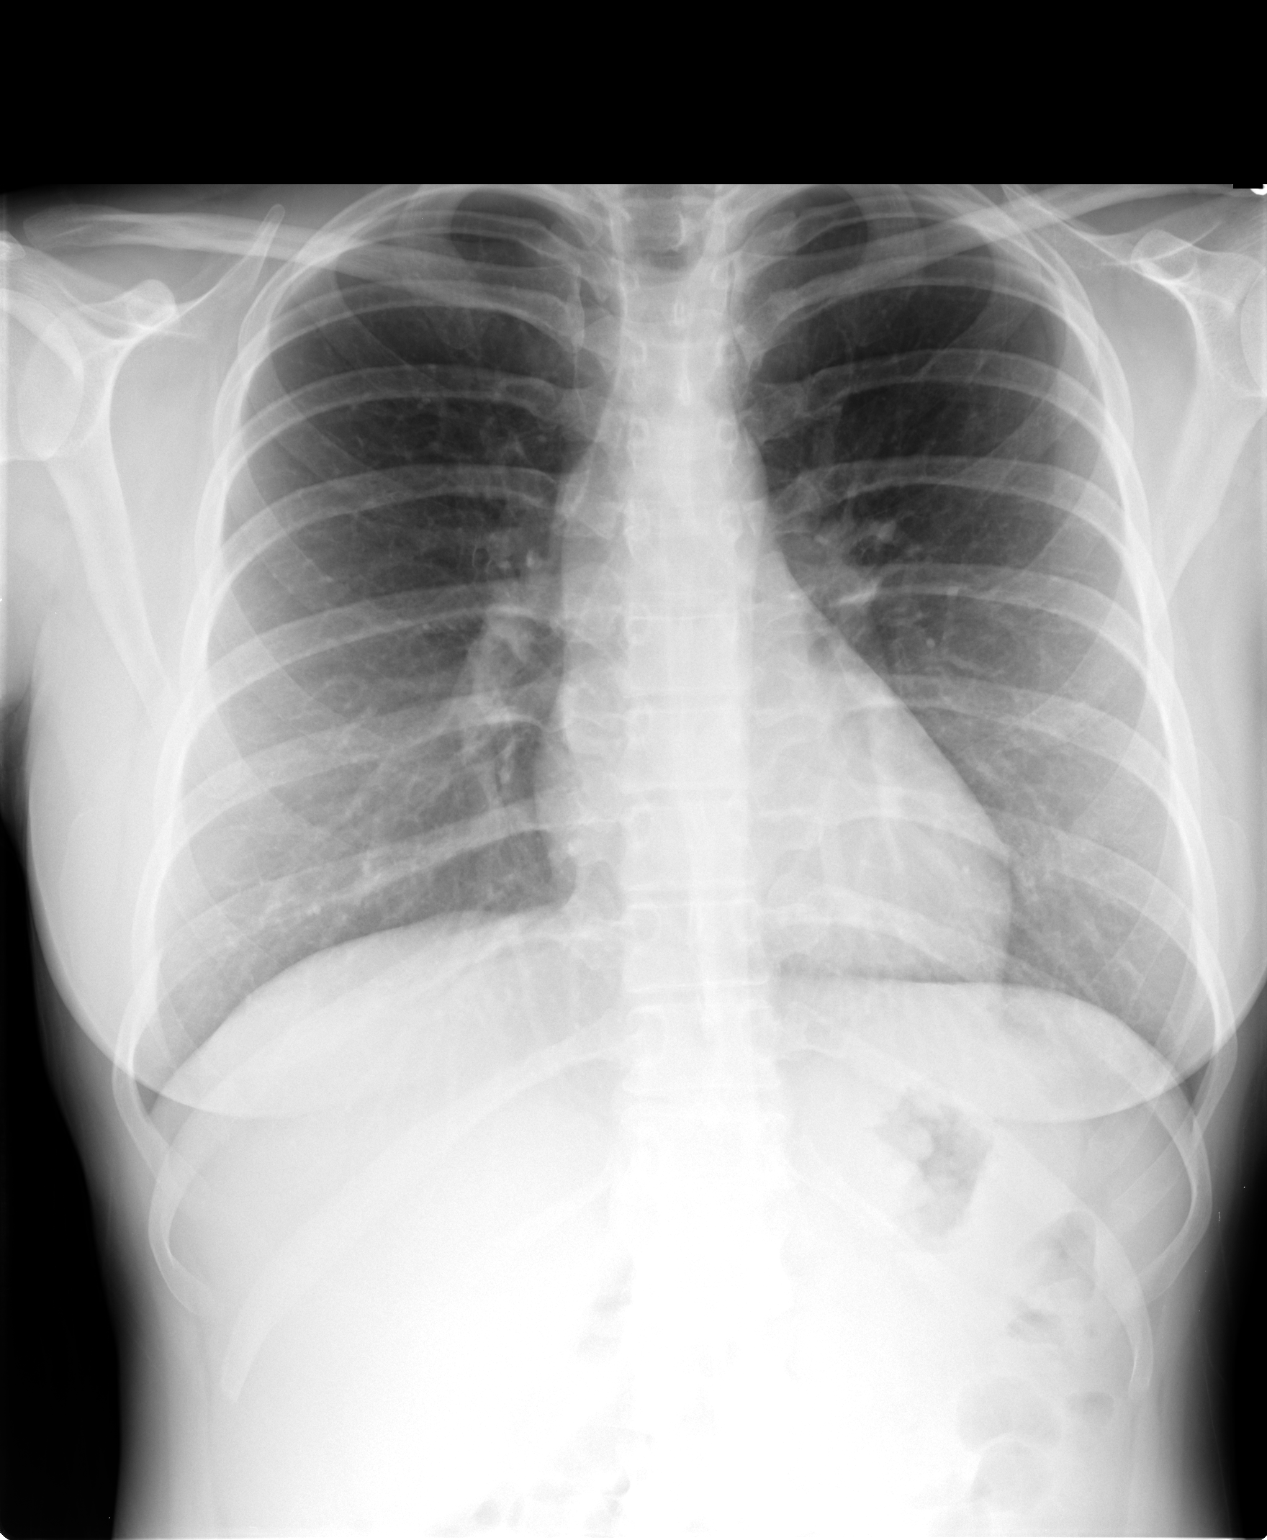

[view not recorded (2 of 3)]
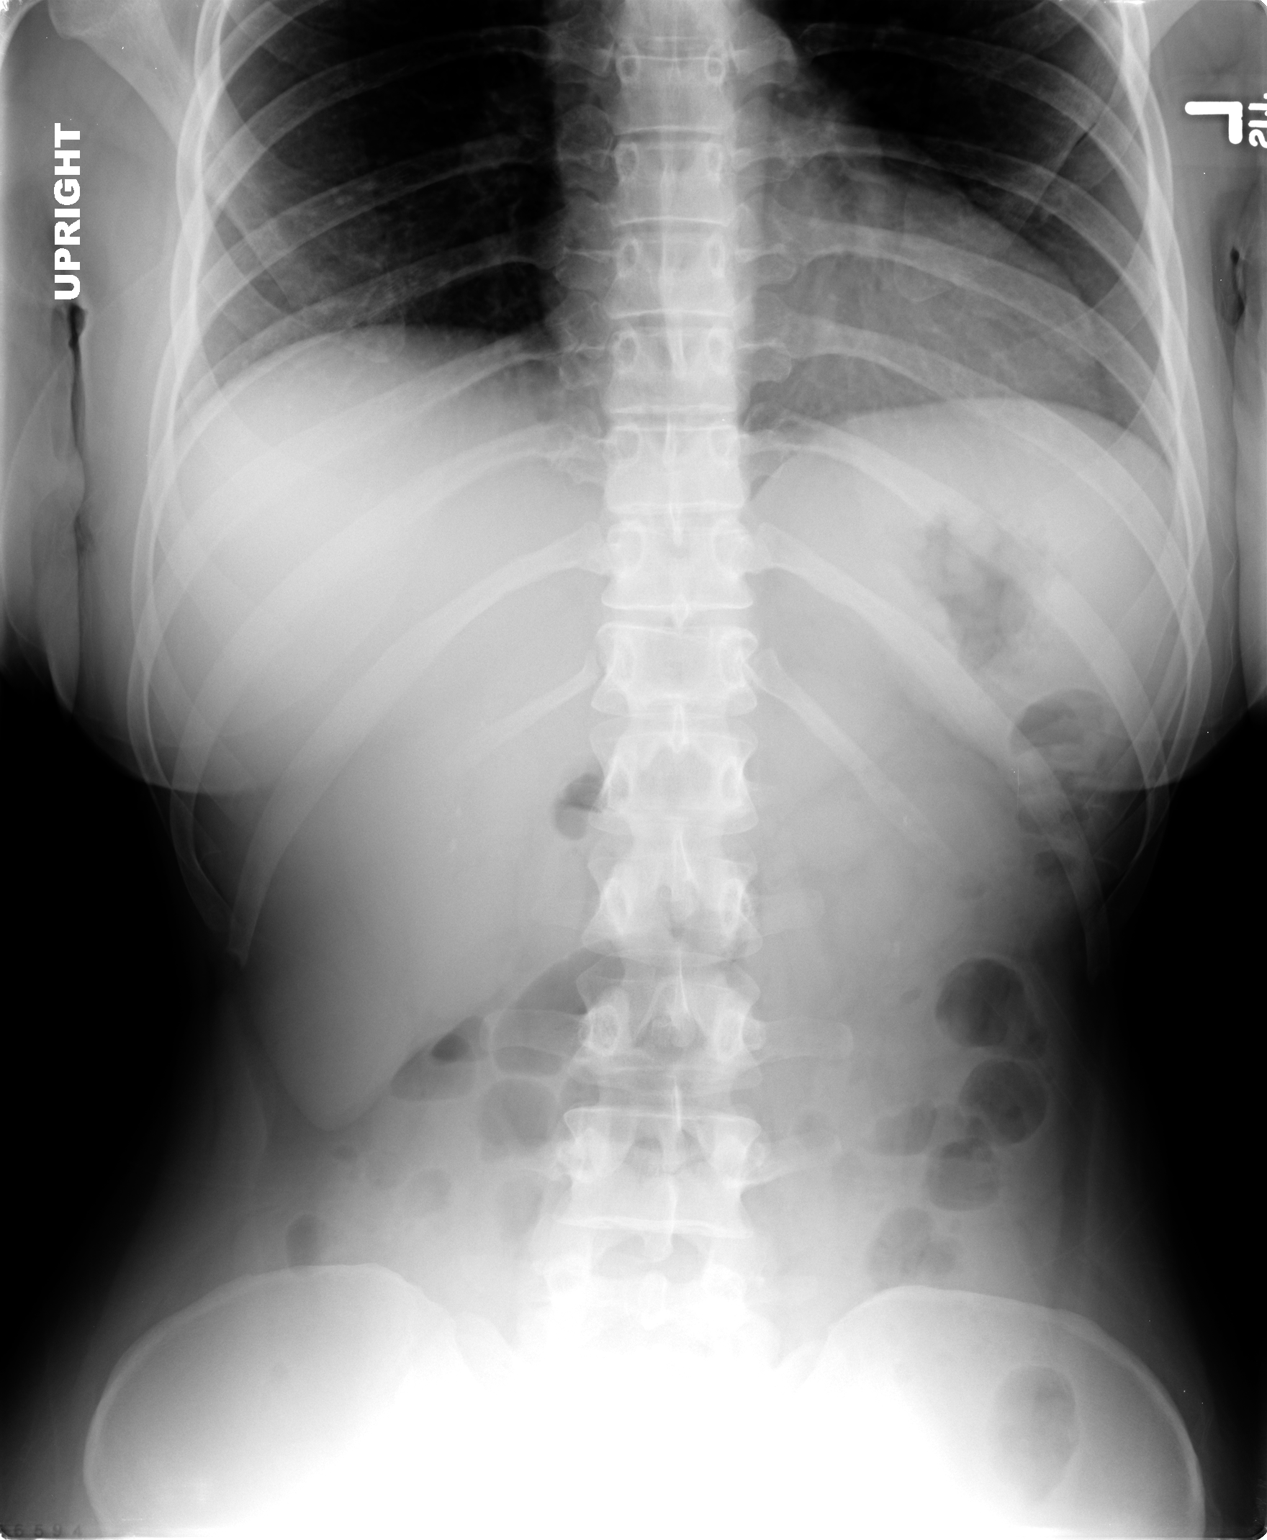

[view not recorded (3 of 3)]
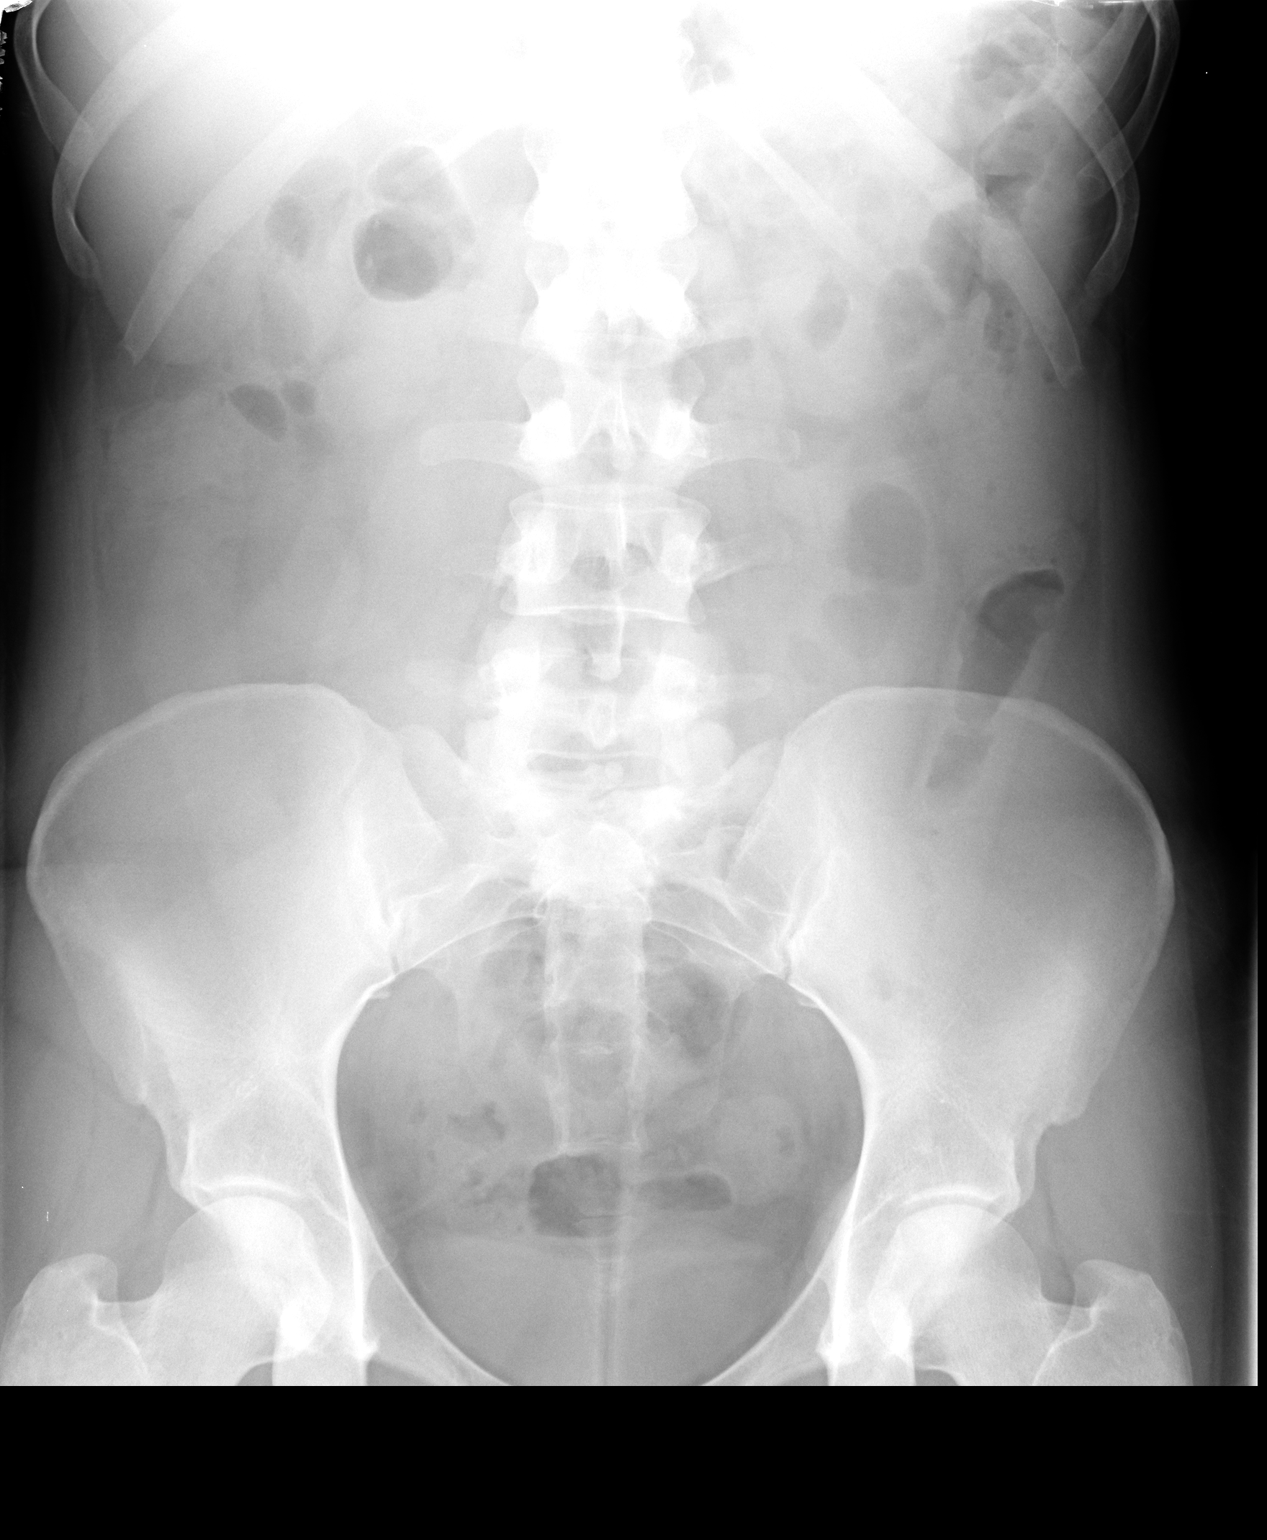

[3 of 3 positions shown; findings below may reference images not displayed]

FINDINGS: No evidence of active chest disease.  Normal
cardiomediastinal silhouette.  No hilar enlargement.  Two very
small renal calcifications bilaterally. Bowel gas pattern
unremarkable.  No free air.
IMPRESSION: No acute chest or abdominal findings.

## 2009-04-15 LAB — CONVERTED CEMR LAB: Magnesium: 1.3 mg/dL — ABNORMAL LOW (ref 1.5–2.5)

## 2009-04-28 ENCOUNTER — Ambulatory Visit: Payer: Self-pay | Admitting: Gastroenterology

## 2009-04-28 DIAGNOSIS — E538 Deficiency of other specified B group vitamins: Secondary | ICD-10-CM

## 2009-04-28 HISTORY — DX: Deficiency of other specified B group vitamins: E53.8

## 2009-04-28 LAB — CONVERTED CEMR LAB
Magnesium: 0.5 mg/dL — CL (ref 1.5–2.5)
Potassium: 2.3 meq/L — CL (ref 3.5–5.1)

## 2009-05-05 ENCOUNTER — Ambulatory Visit: Payer: Self-pay | Admitting: Gastroenterology

## 2009-05-05 LAB — CONVERTED CEMR LAB
Magnesium: 0.6 mg/dL — CL (ref 1.5–2.5)
Potassium: 2.3 meq/L — CL (ref 3.5–5.1)

## 2009-05-13 ENCOUNTER — Ambulatory Visit: Payer: Self-pay | Admitting: Gastroenterology

## 2009-05-13 ENCOUNTER — Telehealth: Payer: Self-pay | Admitting: Gastroenterology

## 2009-05-13 ENCOUNTER — Encounter: Payer: Self-pay | Admitting: Gastroenterology

## 2009-05-13 LAB — CONVERTED CEMR LAB
Magnesium: 1.2 mg/dL — ABNORMAL LOW (ref 1.5–2.5)
Potassium: 2.9 meq/L — ABNORMAL LOW (ref 3.5–5.1)

## 2009-05-15 ENCOUNTER — Telehealth: Payer: Self-pay | Admitting: Gastroenterology

## 2009-05-18 ENCOUNTER — Telehealth: Payer: Self-pay | Admitting: Gastroenterology

## 2009-05-19 ENCOUNTER — Ambulatory Visit: Payer: Self-pay | Admitting: Gastroenterology

## 2009-05-19 DIAGNOSIS — K5000 Crohn's disease of small intestine without complications: Secondary | ICD-10-CM | POA: Insufficient documentation

## 2009-05-19 HISTORY — DX: Crohn's disease of small intestine without complications: K50.00

## 2009-05-20 ENCOUNTER — Encounter: Payer: Self-pay | Admitting: Physician Assistant

## 2009-05-20 LAB — CONVERTED CEMR LAB
Basophils Absolute: 0 10*3/uL (ref 0.0–0.1)
Basophils Relative: 0.2 % (ref 0.0–3.0)
CRP, High Sensitivity: 5.3 — ABNORMAL HIGH (ref 0.00–5.00)
Eosinophils Absolute: 0 10*3/uL (ref 0.0–0.7)
Eosinophils Relative: 0.2 % (ref 0.0–5.0)
HCT: 46.8 % — ABNORMAL HIGH (ref 36.0–46.0)
Hemoglobin: 15.9 g/dL — ABNORMAL HIGH (ref 12.0–15.0)
Lymphocytes Relative: 16.2 % (ref 12.0–46.0)
Lymphs Abs: 3 10*3/uL (ref 0.7–4.0)
MCHC: 33.9 g/dL (ref 30.0–36.0)
MCV: 99 fL (ref 78.0–100.0)
Magnesium: 1.4 mg/dL — ABNORMAL LOW (ref 1.5–2.5)
Monocytes Absolute: 1 10*3/uL (ref 0.1–1.0)
Monocytes Relative: 5.3 % (ref 3.0–12.0)
Neutro Abs: 14.3 10*3/uL — ABNORMAL HIGH (ref 1.4–7.7)
Neutrophils Relative %: 78.1 % — ABNORMAL HIGH (ref 43.0–77.0)
Platelets: 391 10*3/uL (ref 150.0–400.0)
Potassium: 3.1 meq/L — ABNORMAL LOW (ref 3.5–5.1)
RBC: 4.73 M/uL (ref 3.87–5.11)
RDW: 14.8 % — ABNORMAL HIGH (ref 11.5–14.6)
Sed Rate: 6 mm/hr (ref 0–22)
WBC: 18.3 10*3/uL (ref 4.5–10.5)

## 2009-05-22 ENCOUNTER — Telehealth: Payer: Self-pay | Admitting: Gastroenterology

## 2009-05-22 ENCOUNTER — Telehealth (INDEPENDENT_AMBULATORY_CARE_PROVIDER_SITE_OTHER): Payer: Self-pay | Admitting: *Deleted

## 2009-05-28 ENCOUNTER — Ambulatory Visit: Payer: Self-pay | Admitting: Gastroenterology

## 2009-06-02 ENCOUNTER — Telehealth (INDEPENDENT_AMBULATORY_CARE_PROVIDER_SITE_OTHER): Payer: Self-pay | Admitting: *Deleted

## 2009-06-10 ENCOUNTER — Telehealth: Payer: Self-pay | Admitting: Gastroenterology

## 2009-06-15 ENCOUNTER — Telehealth: Payer: Self-pay | Admitting: Physician Assistant

## 2009-06-17 ENCOUNTER — Telehealth (INDEPENDENT_AMBULATORY_CARE_PROVIDER_SITE_OTHER): Payer: Self-pay | Admitting: *Deleted

## 2009-06-18 ENCOUNTER — Ambulatory Visit: Payer: Self-pay | Admitting: Gastroenterology

## 2009-06-18 ENCOUNTER — Telehealth: Payer: Self-pay | Admitting: Physician Assistant

## 2009-06-18 LAB — CONVERTED CEMR LAB
BUN: 8 mg/dL (ref 6–23)
Basophils Absolute: 0 10*3/uL (ref 0.0–0.1)
Basophils Relative: 0.2 % (ref 0.0–3.0)
CO2: 24 meq/L (ref 19–32)
Calcium: 9.2 mg/dL (ref 8.4–10.5)
Chloride: 104 meq/L (ref 96–112)
Creatinine, Ser: 0.6 mg/dL (ref 0.4–1.2)
Eosinophils Absolute: 0 10*3/uL (ref 0.0–0.7)
Eosinophils Relative: 0.2 % (ref 0.0–5.0)
GFR calc non Af Amer: 126.64 mL/min (ref >60)
Glucose, Bld: 84 mg/dL (ref 70–99)
HCT: 44.2 % (ref 36.0–46.0)
Hemoglobin: 15.3 g/dL — ABNORMAL HIGH (ref 12.0–15.0)
Lymphocytes Relative: 11.1 % — ABNORMAL LOW (ref 12.0–46.0)
Lymphs Abs: 1.9 10*3/uL (ref 0.7–4.0)
MCHC: 34.6 g/dL (ref 30.0–36.0)
MCV: 101 fL — ABNORMAL HIGH (ref 78.0–100.0)
Monocytes Absolute: 0.1 10*3/uL (ref 0.1–1.0)
Monocytes Relative: 0.8 % — ABNORMAL LOW (ref 3.0–12.0)
Neutro Abs: 14.7 10*3/uL — ABNORMAL HIGH (ref 1.4–7.7)
Neutrophils Relative %: 87.7 % — ABNORMAL HIGH (ref 43.0–77.0)
Platelets: 325 10*3/uL (ref 150.0–400.0)
Potassium: 3.7 meq/L (ref 3.5–5.1)
RBC: 4.38 M/uL (ref 3.87–5.11)
RDW: 13.8 % (ref 11.5–14.6)
Sodium: 138 meq/L (ref 135–145)
WBC: 16.7 10*3/uL — ABNORMAL HIGH (ref 4.5–10.5)

## 2009-06-24 ENCOUNTER — Telehealth: Payer: Self-pay | Admitting: Gastroenterology

## 2009-07-03 ENCOUNTER — Telehealth: Payer: Self-pay | Admitting: Gastroenterology

## 2009-07-08 ENCOUNTER — Telehealth: Payer: Self-pay | Admitting: Gastroenterology

## 2009-07-16 ENCOUNTER — Telehealth (INDEPENDENT_AMBULATORY_CARE_PROVIDER_SITE_OTHER): Payer: Self-pay | Admitting: *Deleted

## 2009-07-22 ENCOUNTER — Telehealth: Payer: Self-pay | Admitting: Gastroenterology

## 2009-07-24 ENCOUNTER — Telehealth: Payer: Self-pay | Admitting: Gastroenterology

## 2009-07-28 ENCOUNTER — Ambulatory Visit: Payer: Self-pay | Admitting: Gastroenterology

## 2009-08-20 ENCOUNTER — Telehealth: Payer: Self-pay | Admitting: Gastroenterology

## 2009-08-24 ENCOUNTER — Ambulatory Visit: Payer: Self-pay | Admitting: Internal Medicine

## 2009-08-24 ENCOUNTER — Telehealth: Payer: Self-pay | Admitting: Physician Assistant

## 2009-08-26 ENCOUNTER — Telehealth: Payer: Self-pay | Admitting: Gastroenterology

## 2009-08-26 ENCOUNTER — Emergency Department (HOSPITAL_COMMUNITY): Admission: EM | Admit: 2009-08-26 | Discharge: 2009-08-26 | Payer: Self-pay | Admitting: Emergency Medicine

## 2009-08-26 LAB — CONVERTED CEMR LAB
BUN: 6 mg/dL (ref 6–23)
Basophils Absolute: 0 10*3/uL (ref 0.0–0.1)
Basophils Relative: 0.4 % (ref 0.0–3.0)
CO2: 22 meq/L (ref 19–32)
CRP, High Sensitivity: 0.2 (ref 0.00–5.00)
Calcium: 8.8 mg/dL (ref 8.4–10.5)
Chloride: 107 meq/L (ref 96–112)
Creatinine, Ser: 0.7 mg/dL (ref 0.4–1.2)
Eosinophils Absolute: 0.2 10*3/uL (ref 0.0–0.7)
Eosinophils Relative: 2.2 % (ref 0.0–5.0)
GFR calc non Af Amer: 105.86 mL/min (ref >60)
Glucose, Bld: 75 mg/dL (ref 70–99)
HCT: 43.1 % (ref 36.0–46.0)
Hemoglobin: 14.7 g/dL (ref 12.0–15.0)
Lymphocytes Relative: 36.8 % (ref 12.0–46.0)
Lymphs Abs: 3.3 10*3/uL (ref 0.7–4.0)
MCHC: 34.2 g/dL (ref 30.0–36.0)
MCV: 102.6 fL — ABNORMAL HIGH (ref 78.0–100.0)
Magnesium: 1.7 mg/dL (ref 1.5–2.5)
Monocytes Absolute: 0.6 10*3/uL (ref 0.1–1.0)
Monocytes Relative: 6.5 % (ref 3.0–12.0)
Neutro Abs: 4.8 10*3/uL (ref 1.4–7.7)
Neutrophils Relative %: 54.1 % (ref 43.0–77.0)
Platelets: 279 10*3/uL (ref 150.0–400.0)
Potassium: 4.3 meq/L (ref 3.5–5.1)
RBC: 4.2 M/uL (ref 3.87–5.11)
RDW: 12.6 % (ref 11.5–14.6)
Sodium: 140 meq/L (ref 135–145)
WBC: 8.9 10*3/uL (ref 4.5–10.5)

## 2009-09-11 ENCOUNTER — Ambulatory Visit: Payer: Self-pay | Admitting: Gastroenterology

## 2009-09-11 LAB — CONVERTED CEMR LAB
Ferritin: 17.8 ng/mL (ref 10.0–291.0)
Folate: 8.9 ng/mL
Iron: 114 ug/dL (ref 42–145)
Saturation Ratios: 28.5 % (ref 20.0–50.0)
T3, Free: 2.1 pg/mL — ABNORMAL LOW (ref 2.3–4.2)
T4, Total: 6.1 ug/dL (ref 5.0–12.5)
TSH: 2.2 microintl units/mL (ref 0.35–5.50)
Transferrin: 285.7 mg/dL (ref 212.0–360.0)
Vitamin B-12: >1500 pg/mL — ABNORMAL HIGH (ref 211–911)

## 2009-09-21 ENCOUNTER — Ambulatory Visit: Payer: Self-pay | Admitting: Gastroenterology

## 2009-09-21 DIAGNOSIS — E519 Thiamine deficiency, unspecified: Secondary | ICD-10-CM | POA: Insufficient documentation

## 2009-09-21 HISTORY — DX: Thiamine deficiency, unspecified: E51.9

## 2009-09-21 LAB — CONVERTED CEMR LAB: Vit D, 25-Hydroxy: 16 ng/mL — ABNORMAL LOW (ref 30–89)

## 2009-09-22 ENCOUNTER — Ambulatory Visit: Payer: Self-pay | Admitting: Gastroenterology

## 2009-09-23 ENCOUNTER — Ambulatory Visit: Payer: Self-pay | Admitting: Gastroenterology

## 2009-09-24 ENCOUNTER — Telehealth: Payer: Self-pay | Admitting: Gastroenterology

## 2009-09-24 ENCOUNTER — Ambulatory Visit: Payer: Self-pay | Admitting: Gastroenterology

## 2009-09-25 ENCOUNTER — Ambulatory Visit: Payer: Self-pay | Admitting: Gastroenterology

## 2009-09-28 ENCOUNTER — Telehealth: Payer: Self-pay | Admitting: Gastroenterology

## 2009-10-05 ENCOUNTER — Telehealth: Payer: Self-pay | Admitting: Gastroenterology

## 2009-10-08 ENCOUNTER — Ambulatory Visit: Payer: Self-pay | Admitting: Cardiology

## 2009-10-08 IMAGING — CT CT ABD-PELV W/ CM
2 of 4 series · 17 of 46 positions shown, 19 images · IV contrast (Omnipaque 300)
Comparison: CT [DATE].

CLINICAL DATA: Abdominal pain.  History of Crohn disease.

CT ABDOMEN AND PELVIS WITH CONTRAST
TECHNIQUE: Multidetector CT imaging of the abdomen and pelvis was
performed following the standard protocol during bolus
administration of intravenous contrast.
Contrast: 100 ml [I6] IV.  Oral contrast media.

[Series 2: abd/ pel · axial · 0.76mm/px · z∈[-412,-2]mm · 14 of 90 slices shown, 16 images]
[im 4/90  soft-tissue]
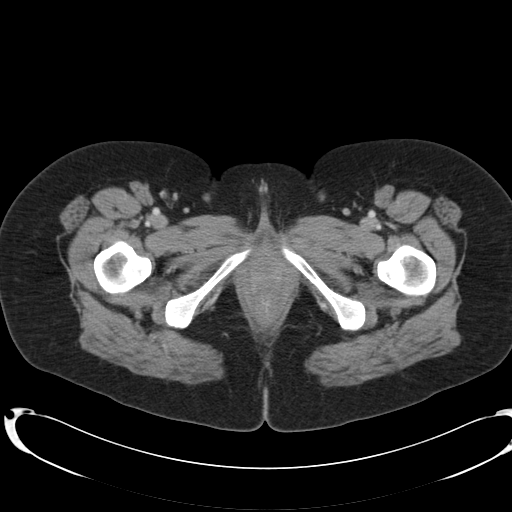
[im 4/90  bone]
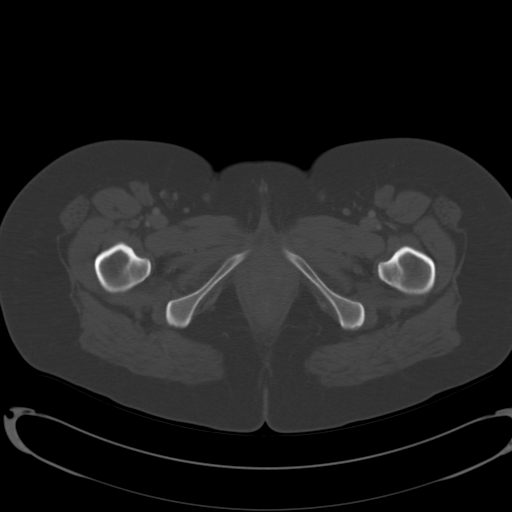
[im 12/90  soft-tissue]
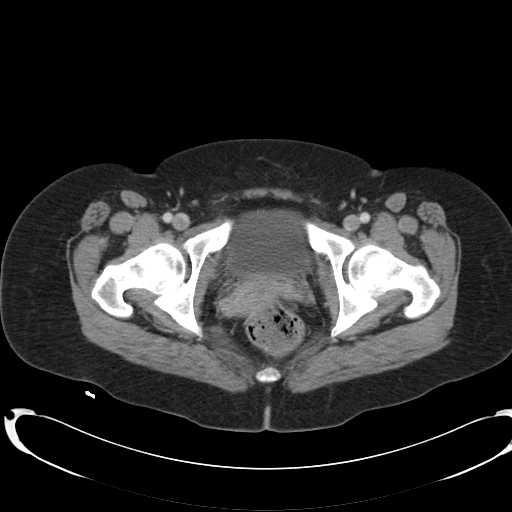
[im 19/90  soft-tissue]
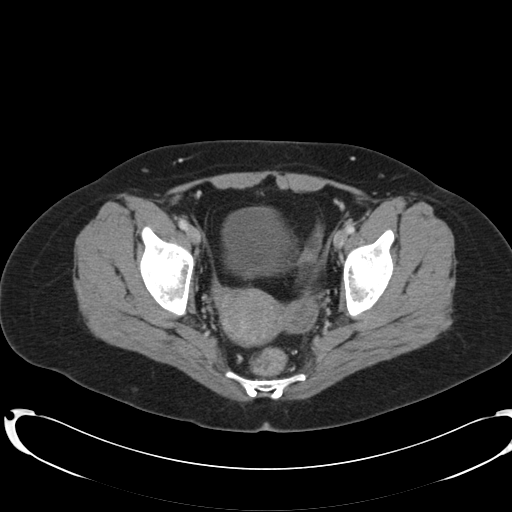
[im 23/90  soft-tissue]
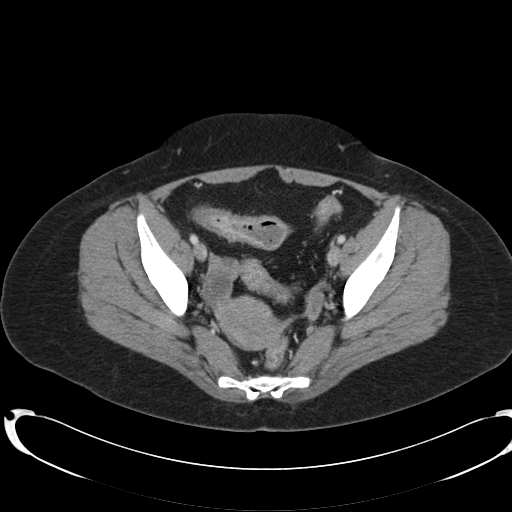
[im 30/90  soft-tissue]
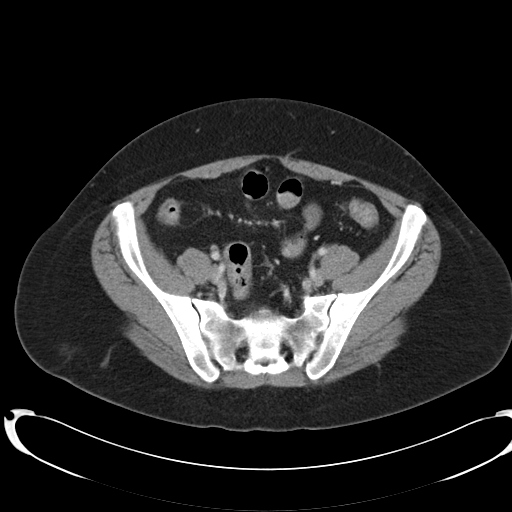
[im 38/90  soft-tissue]
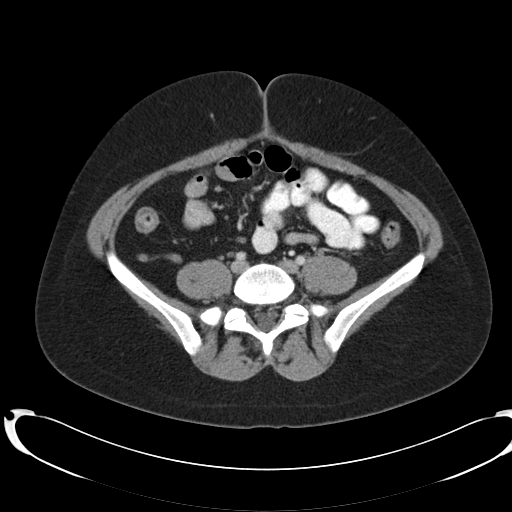
[im 41/90  soft-tissue]
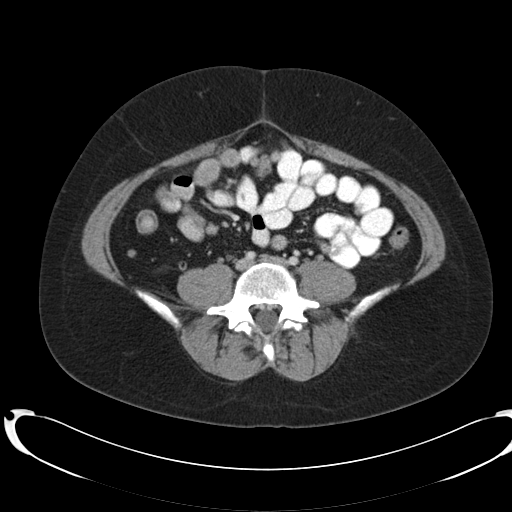
[im 49/90  soft-tissue]
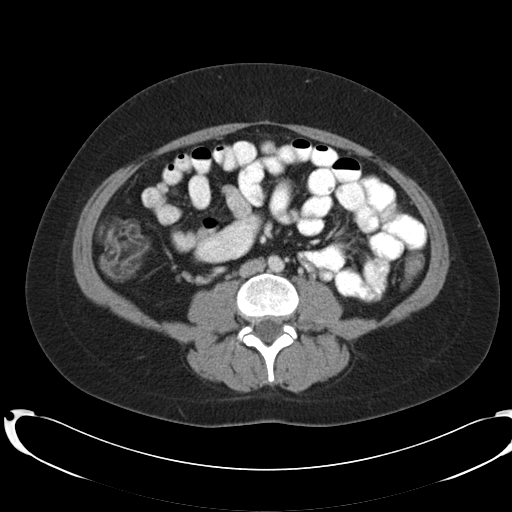
[im 52/90  soft-tissue]
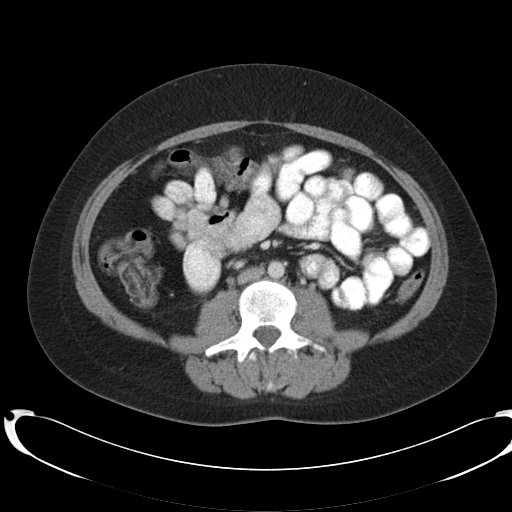
[im 52/90  bone]
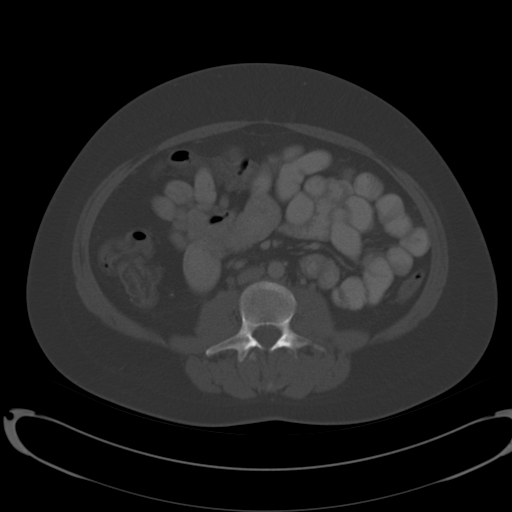
[im 60/90  soft-tissue]
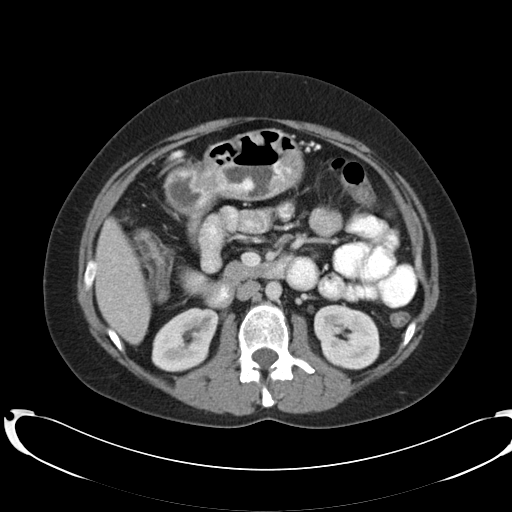
[im 67/90  soft-tissue]
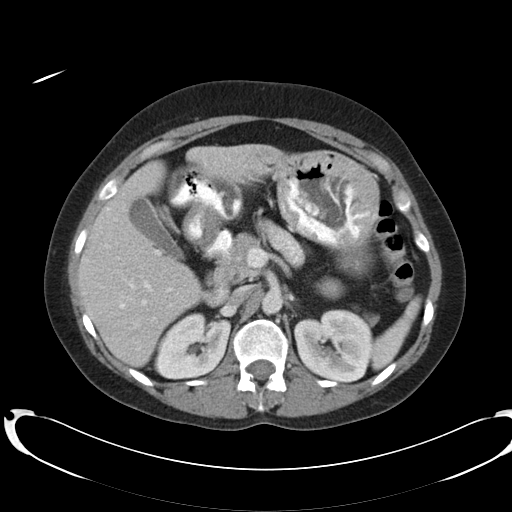
[im 71/90  soft-tissue]
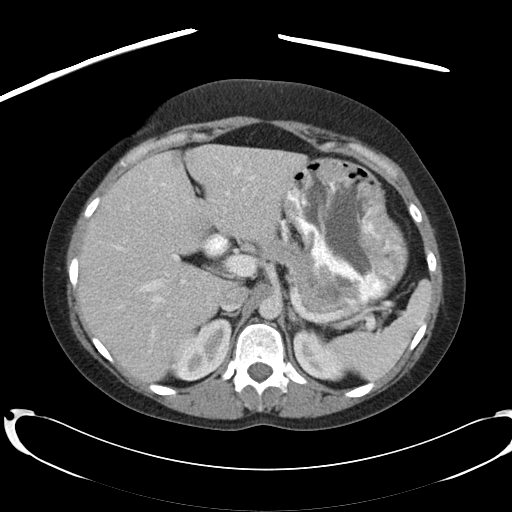
[im 78/90  soft-tissue]
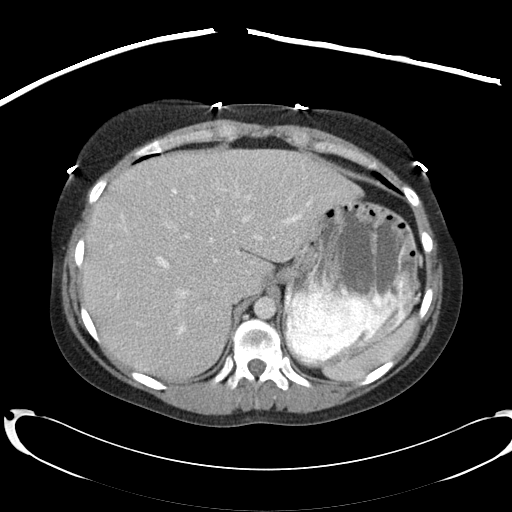
[im 86/90  soft-tissue]
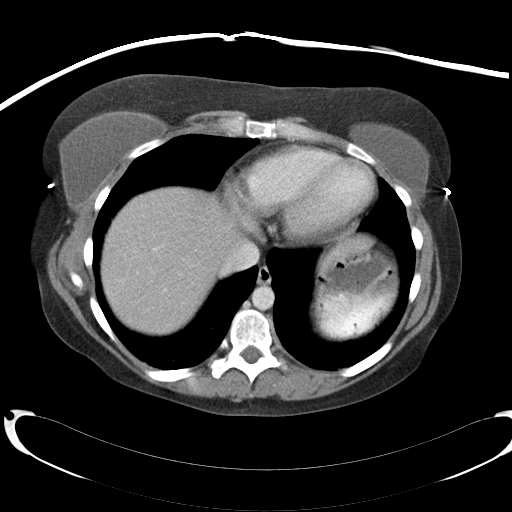

[Series 602: <mpr range> · coronal · 0.89mm/px · 3 of 130 slices shown]
[im 44/130  soft-tissue]
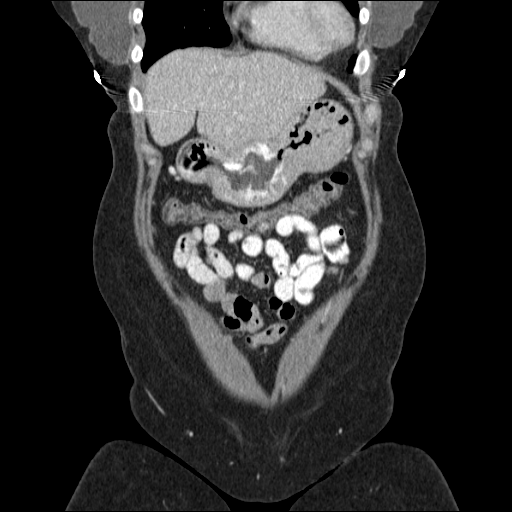
[im 58/130  soft-tissue]
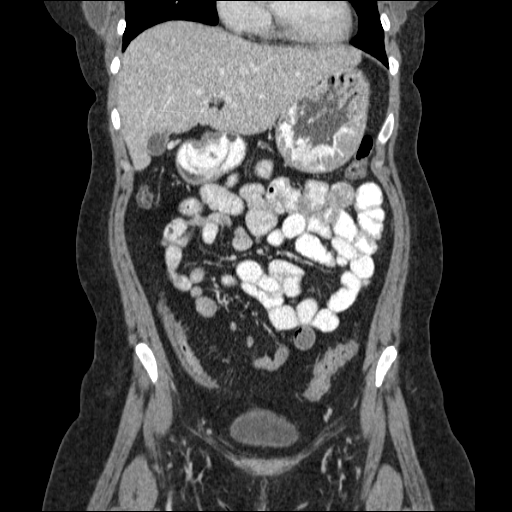
[im 72/130  soft-tissue]
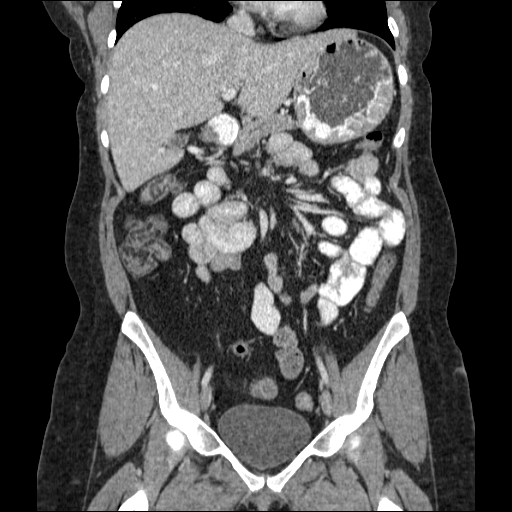

[17 of 46 positions shown; findings below may reference images not displayed]

FINDINGS: There are bilateral single 3 mm renal calculi.  No
hydronephrosis.  Bilateral breast prostheses.  The liver, spleen,
pancreas, and adrenal glands appear unremarkable.  No free fluid.
No abscess.  Bilateral ovarian follicles.  Uterus appears
unremarkable.

There is fatty infiltration of the wall of the colon mainly the
cecum and ascending colon, most likely a result of previous
colitis.  No findings to suggest acute colitis.  Wall thickening of
the distal ileum and terminal ileum with prominent surrounding
vascular pattern/vasa recta/CT comb sign of the distal ileum
suggesting active inflammation. Moderate suspicion for a stricture
of the distal ileum (image 68). No bowel obstruction.
IMPRESSION: Fatty infiltration of the wall of the colon consistent with remote
colitis.  Negative for active colitis.  Distal ileitis with
findings worrisome for active disease involving the distal ileum
just proximal to the terminal ileum.

## 2009-10-20 ENCOUNTER — Telehealth: Payer: Self-pay | Admitting: Gastroenterology

## 2009-10-23 ENCOUNTER — Emergency Department (HOSPITAL_COMMUNITY): Admission: EM | Admit: 2009-10-23 | Discharge: 2009-10-23 | Payer: Self-pay | Admitting: Family Medicine

## 2009-10-28 ENCOUNTER — Telehealth: Payer: Self-pay | Admitting: Gastroenterology

## 2009-10-29 ENCOUNTER — Ambulatory Visit: Payer: Self-pay | Admitting: Gastroenterology

## 2009-10-29 ENCOUNTER — Ambulatory Visit: Payer: Self-pay | Admitting: Internal Medicine

## 2009-11-03 ENCOUNTER — Telehealth: Payer: Self-pay | Admitting: Gastroenterology

## 2009-11-04 LAB — CONVERTED CEMR LAB
Basophils Absolute: 0 10*3/uL (ref 0.0–0.1)
Basophils Relative: 0.3 % (ref 0.0–3.0)
CRP, High Sensitivity: 0.5 (ref 0.00–5.00)
Eosinophils Absolute: 0.2 10*3/uL (ref 0.0–0.7)
Eosinophils Relative: 2.2 % (ref 0.0–5.0)
HCT: 40.7 % (ref 36.0–46.0)
Hemoglobin: 13.6 g/dL (ref 12.0–15.0)
Lymphocytes Relative: 33.3 % (ref 12.0–46.0)
Lymphs Abs: 3.2 10*3/uL (ref 0.7–4.0)
MCHC: 33.4 g/dL (ref 30.0–36.0)
MCV: 99.3 fL (ref 78.0–100.0)
Monocytes Absolute: 0.6 10*3/uL (ref 0.1–1.0)
Monocytes Relative: 6.3 % (ref 3.0–12.0)
Neutro Abs: 5.7 10*3/uL (ref 1.4–7.7)
Neutrophils Relative %: 57.9 % (ref 43.0–77.0)
Platelets: 266 10*3/uL (ref 150.0–400.0)
RBC: 4.1 M/uL (ref 3.87–5.11)
RDW: 12.3 % (ref 11.5–14.6)
WBC: 9.7 10*3/uL (ref 4.5–10.5)

## 2009-11-11 ENCOUNTER — Telehealth: Payer: Self-pay | Admitting: Physician Assistant

## 2009-11-13 ENCOUNTER — Telehealth: Payer: Self-pay | Admitting: Physician Assistant

## 2009-11-20 ENCOUNTER — Telehealth: Payer: Self-pay | Admitting: Physician Assistant

## 2009-11-23 ENCOUNTER — Telehealth: Payer: Self-pay | Admitting: Gastroenterology

## 2009-12-02 ENCOUNTER — Ambulatory Visit: Payer: Self-pay | Admitting: Internal Medicine

## 2009-12-02 ENCOUNTER — Ambulatory Visit: Payer: Self-pay | Admitting: Gastroenterology

## 2009-12-02 DIAGNOSIS — M542 Cervicalgia: Secondary | ICD-10-CM | POA: Insufficient documentation

## 2009-12-02 DIAGNOSIS — F909 Attention-deficit hyperactivity disorder, unspecified type: Secondary | ICD-10-CM

## 2009-12-02 DIAGNOSIS — F17200 Nicotine dependence, unspecified, uncomplicated: Secondary | ICD-10-CM | POA: Insufficient documentation

## 2009-12-02 DIAGNOSIS — L719 Rosacea, unspecified: Secondary | ICD-10-CM | POA: Insufficient documentation

## 2009-12-02 DIAGNOSIS — M549 Dorsalgia, unspecified: Secondary | ICD-10-CM | POA: Insufficient documentation

## 2009-12-02 HISTORY — DX: Attention-deficit hyperactivity disorder, unspecified type: F90.9

## 2009-12-02 HISTORY — DX: Nicotine dependence, unspecified, uncomplicated: F17.200

## 2009-12-02 HISTORY — DX: Rosacea, unspecified: L71.9

## 2009-12-02 HISTORY — DX: Cervicalgia: M54.2

## 2009-12-17 ENCOUNTER — Telehealth: Payer: Self-pay | Admitting: Gastroenterology

## 2009-12-17 ENCOUNTER — Telehealth: Payer: Self-pay | Admitting: Internal Medicine

## 2009-12-17 ENCOUNTER — Emergency Department (HOSPITAL_COMMUNITY): Admission: EM | Admit: 2009-12-17 | Discharge: 2009-12-17 | Payer: Self-pay | Admitting: Emergency Medicine

## 2009-12-17 IMAGING — CT CT HEAD W/O CM
1 series · 16 of 30 positions shown, 20 images · non-contrast
Comparison: [DATE]

CLINICAL DATA: Left facial numbness.  Slurred speech.  Left arm
weakness.  Blurred vision.

CT HEAD WITHOUT CONTRAST
TECHNIQUE: Contiguous axial images were obtained from the base of
the skull through the vertex without contrast.

[Series 2: headseq 4.8 h45s · axial · 0.43mm/px · z∈[+1109,+1238]mm · 16 of 30 slices shown, 20 images]
[im 2/30  brain]
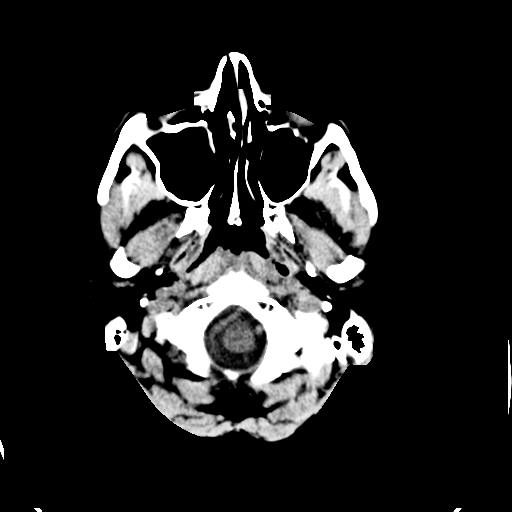
[im 2/30  bone]
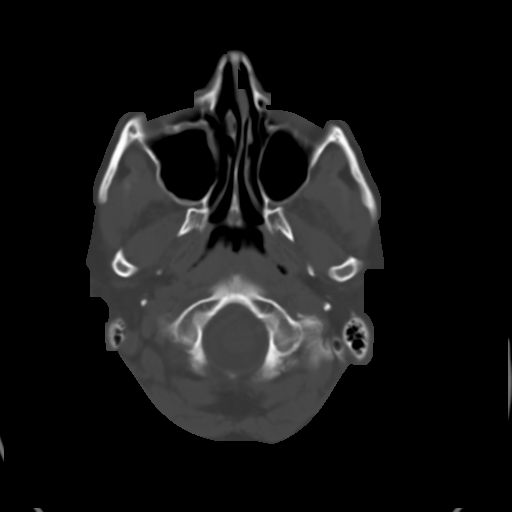
[im 4/30  brain]
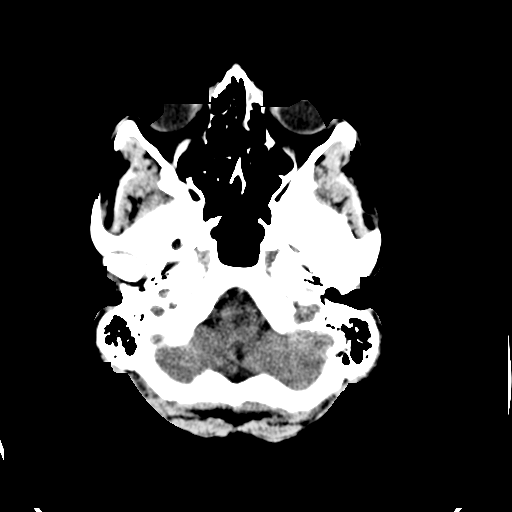
[im 6/30  brain]
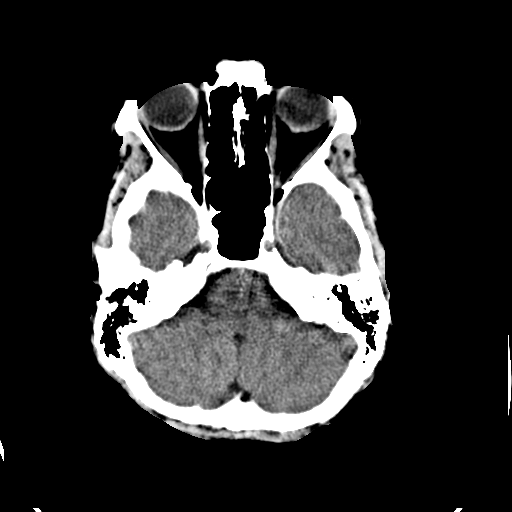
[im 8/30  brain]
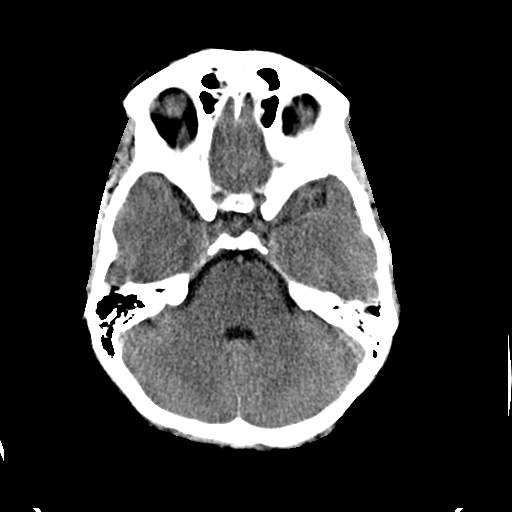
[im 9/30  brain]
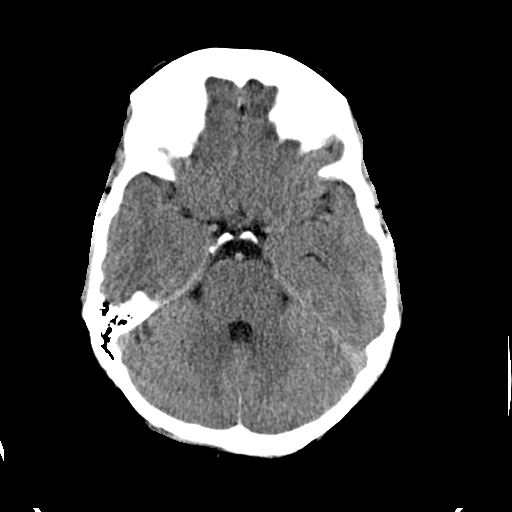
[im 9/30  bone]
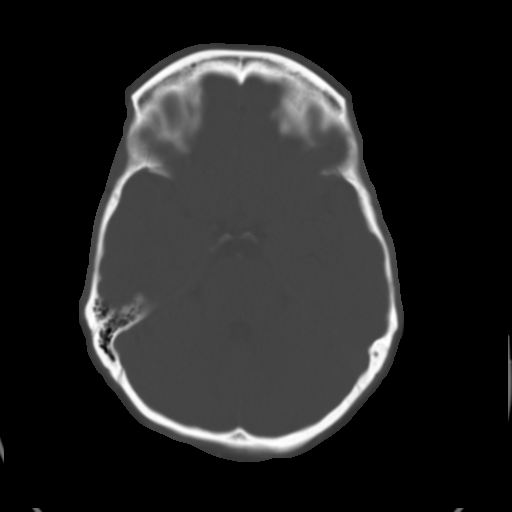
[im 11/30  brain]
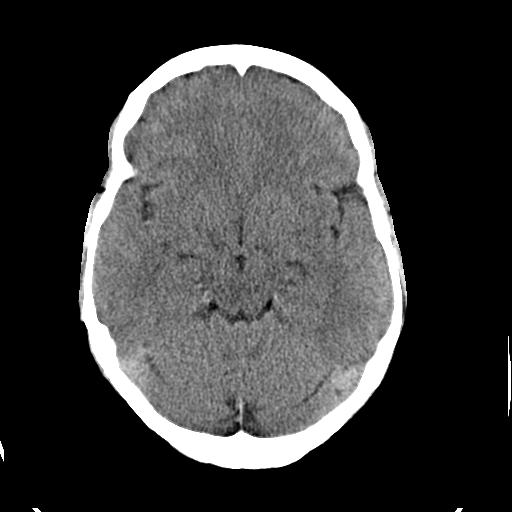
[im 13/30  brain]
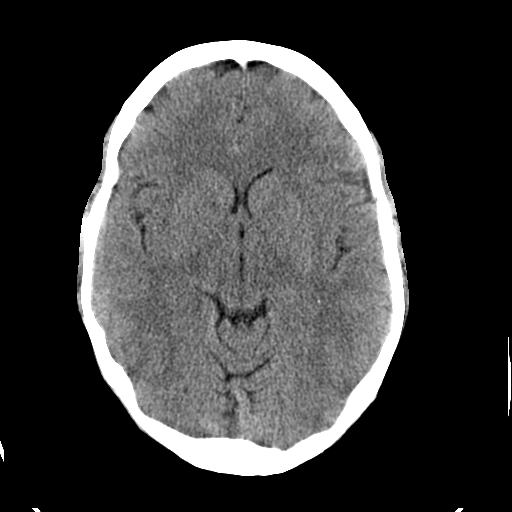
[im 15/30  brain]
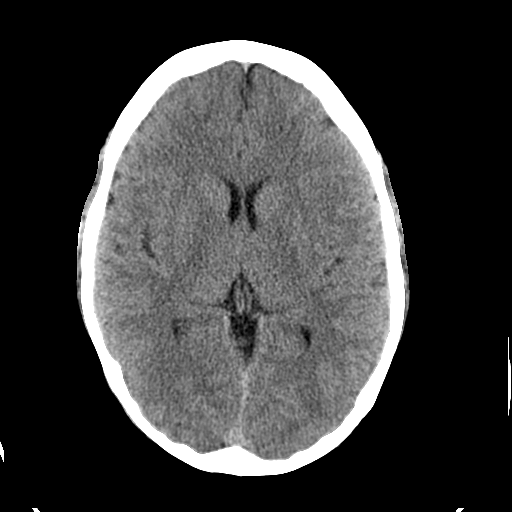
[im 16/30  brain]
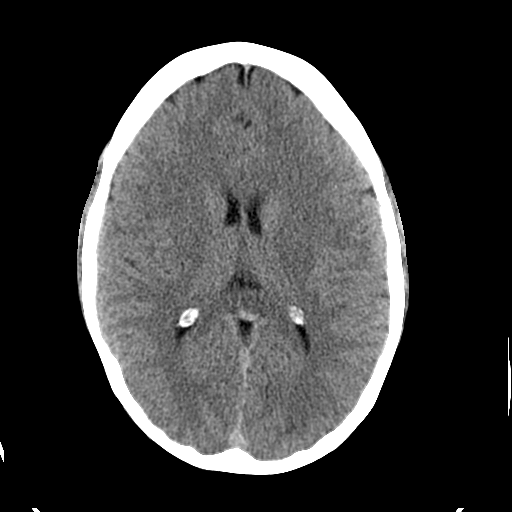
[im 16/30  bone]
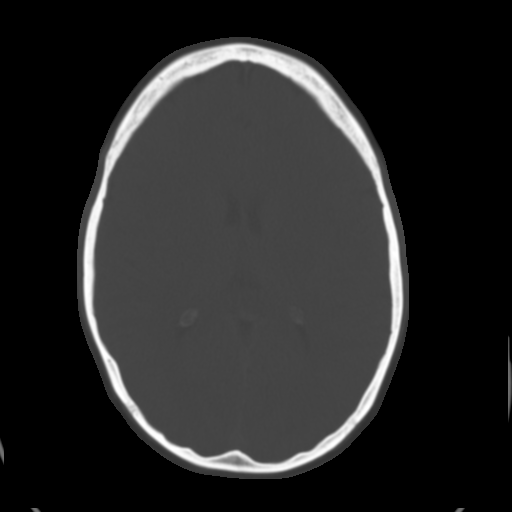
[im 18/30  brain]
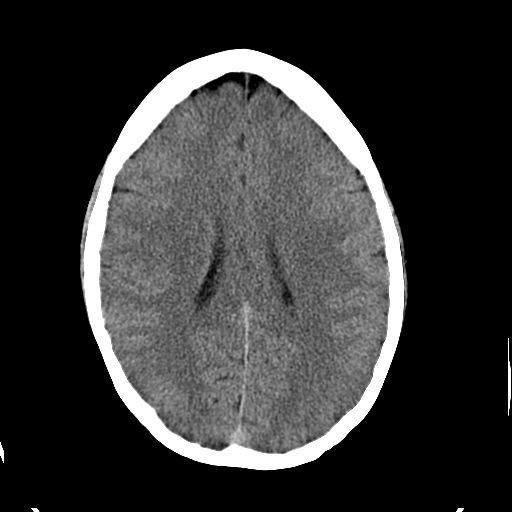
[im 20/30  brain]
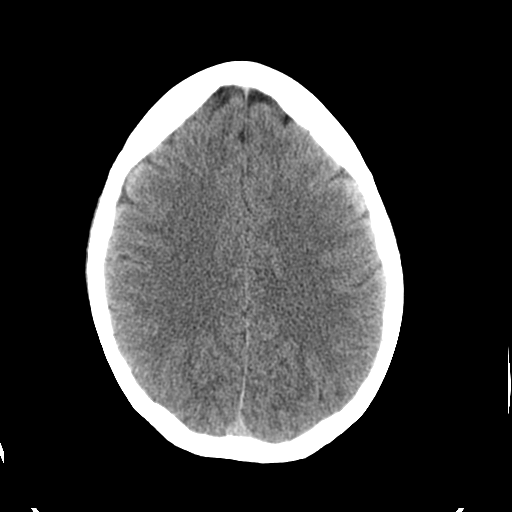
[im 22/30  brain]
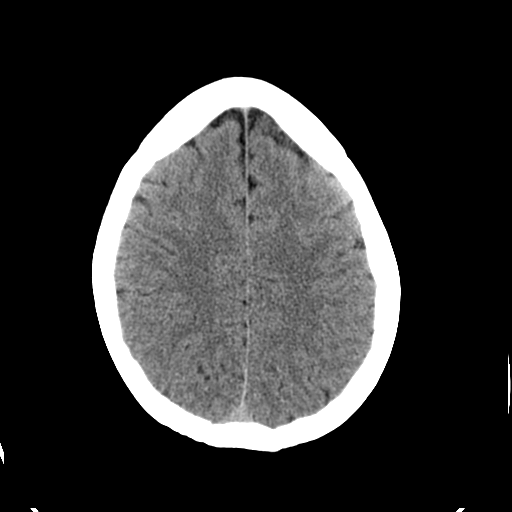
[im 23/30  brain]
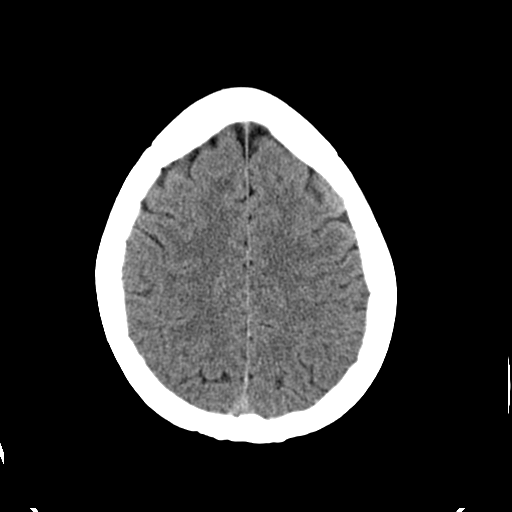
[im 23/30  bone]
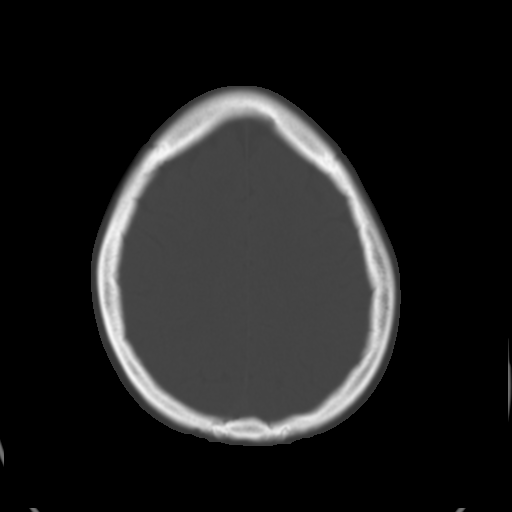
[im 25/30  brain]
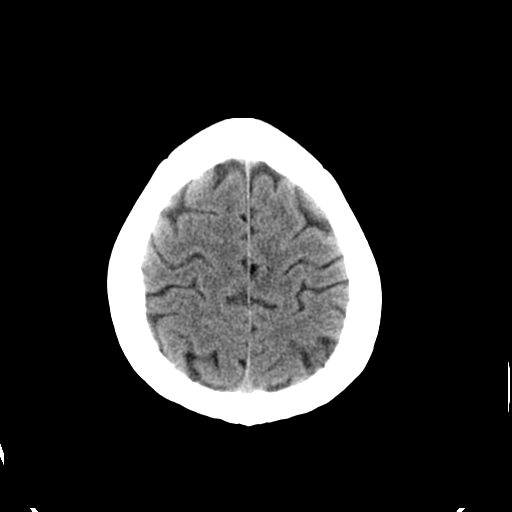
[im 27/30  brain]
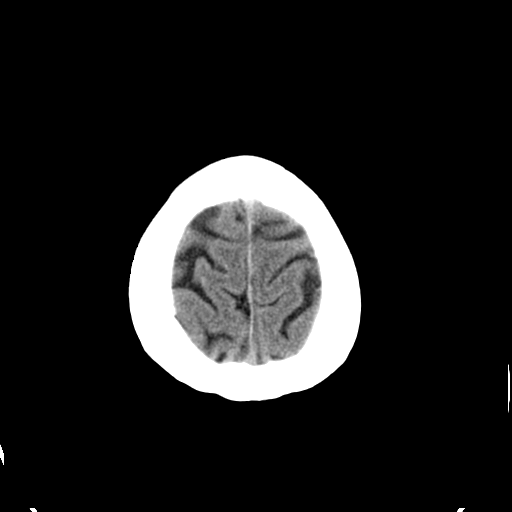
[im 29/30  brain]
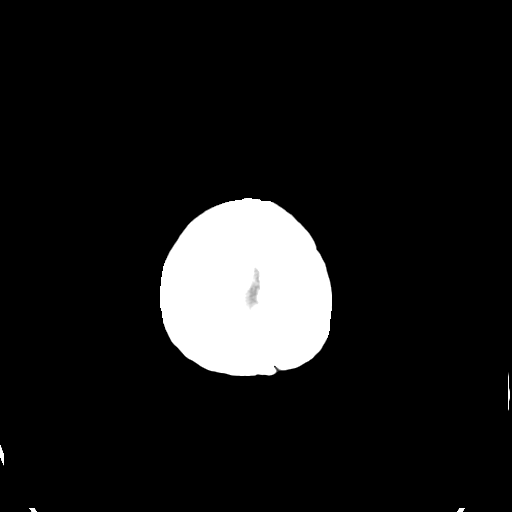

[16 of 30 positions shown; findings below may reference images not displayed]

FINDINGS: The brain has a normal appearance without evidence of
malformation, atrophy, old or acute infarction, mass lesion,
hemorrhage, hydrocephalus or extra-axial collection.  The calvarium
is unremarkable.  No significant sinus disease.
IMPRESSION: Normal head CT

## 2009-12-23 ENCOUNTER — Telehealth (INDEPENDENT_AMBULATORY_CARE_PROVIDER_SITE_OTHER): Payer: Self-pay | Admitting: *Deleted

## 2009-12-29 ENCOUNTER — Telehealth: Payer: Self-pay | Admitting: Gastroenterology

## 2009-12-30 ENCOUNTER — Telehealth: Payer: Self-pay | Admitting: Gastroenterology

## 2010-01-07 ENCOUNTER — Telehealth: Payer: Self-pay | Admitting: Internal Medicine

## 2010-01-08 ENCOUNTER — Ambulatory Visit: Payer: Self-pay | Admitting: Gastroenterology

## 2010-01-11 ENCOUNTER — Telehealth: Payer: Self-pay | Admitting: Gastroenterology

## 2010-01-19 ENCOUNTER — Telehealth: Payer: Self-pay | Admitting: Gastroenterology

## 2010-01-22 ENCOUNTER — Telehealth: Payer: Self-pay | Admitting: Gastroenterology

## 2010-02-05 ENCOUNTER — Ambulatory Visit: Payer: Self-pay | Admitting: Internal Medicine

## 2010-02-05 ENCOUNTER — Telehealth: Payer: Self-pay | Admitting: Gastroenterology

## 2010-02-05 ENCOUNTER — Ambulatory Visit: Payer: Self-pay | Admitting: Gastroenterology

## 2010-02-05 DIAGNOSIS — M76899 Other specified enthesopathies of unspecified lower limb, excluding foot: Secondary | ICD-10-CM

## 2010-02-05 DIAGNOSIS — G43009 Migraine without aura, not intractable, without status migrainosus: Secondary | ICD-10-CM

## 2010-02-05 DIAGNOSIS — L02419 Cutaneous abscess of limb, unspecified: Secondary | ICD-10-CM

## 2010-02-05 HISTORY — DX: Cellulitis of unspecified part of limb: L02.419

## 2010-02-05 HISTORY — DX: Migraine without aura, not intractable, without status migrainosus: G43.009

## 2010-02-05 HISTORY — DX: Other specified enthesopathies of unspecified lower limb, excluding foot: M76.899

## 2010-02-08 ENCOUNTER — Ambulatory Visit: Payer: Self-pay | Admitting: Surgery

## 2010-02-08 ENCOUNTER — Encounter (INDEPENDENT_AMBULATORY_CARE_PROVIDER_SITE_OTHER): Payer: Self-pay | Admitting: Internal Medicine

## 2010-02-08 ENCOUNTER — Inpatient Hospital Stay (HOSPITAL_COMMUNITY): Admission: EM | Admit: 2010-02-08 | Discharge: 2010-02-10 | Payer: Self-pay | Admitting: Emergency Medicine

## 2010-02-08 IMAGING — CR DG ABDOMEN ACUTE W/ 1V CHEST
3 series · 3 of 3 positions shown · non-contrast
Comparison: [DATE]

CLINICAL DATA: Vomiting.  Crohn's disease.

ACUTE ABDOMEN SERIES (ABDOMEN 2 VIEW & CHEST 1 VIEW)

[w abdomen decub]
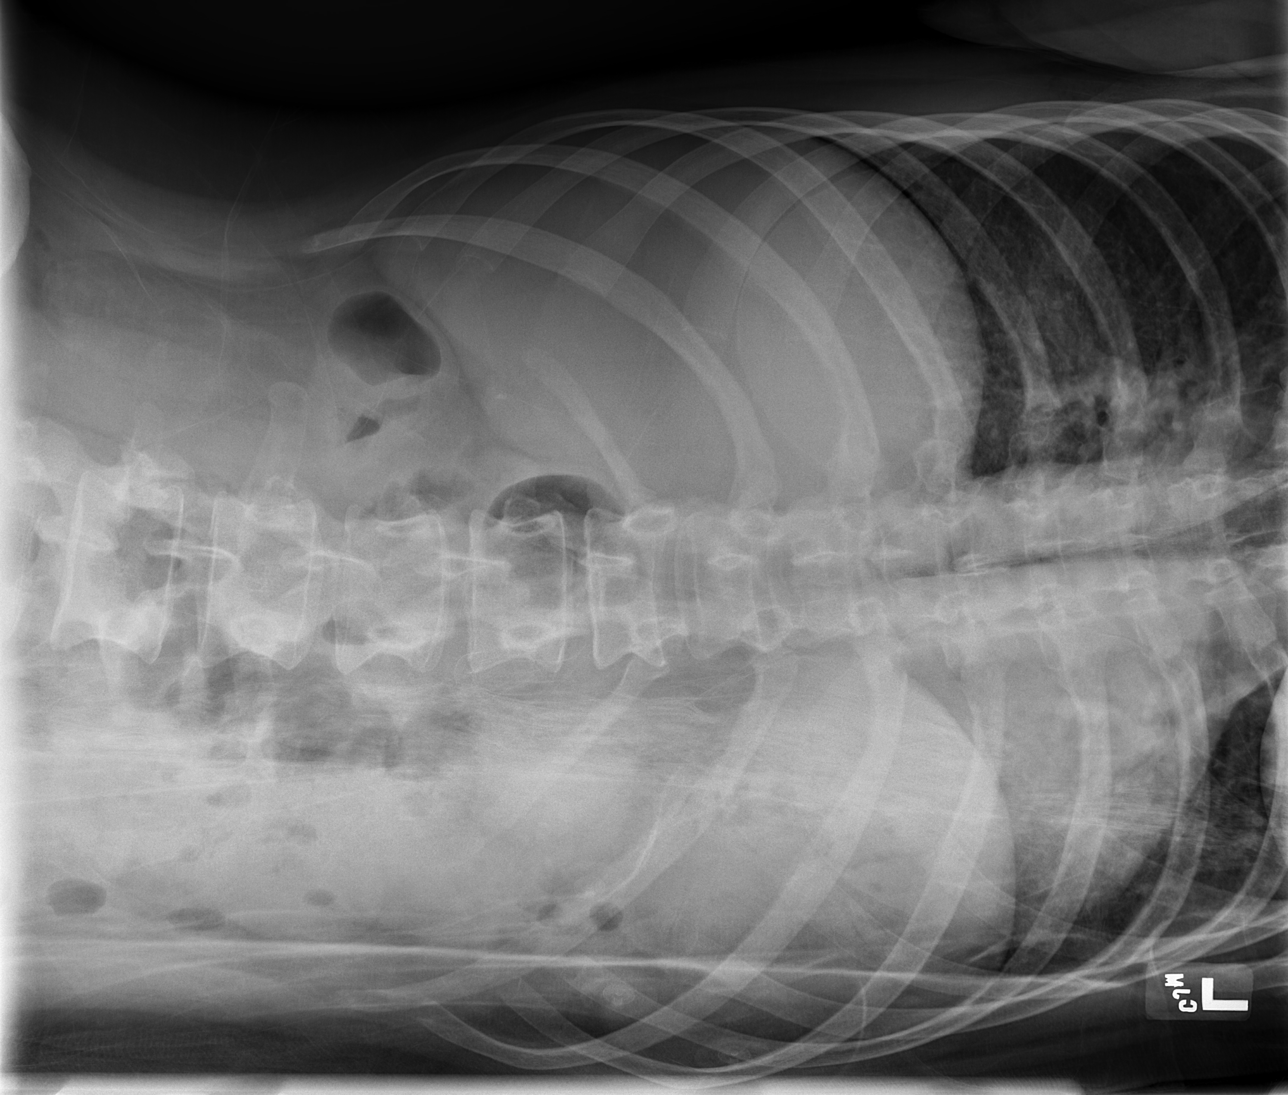

[t abdomen supine]
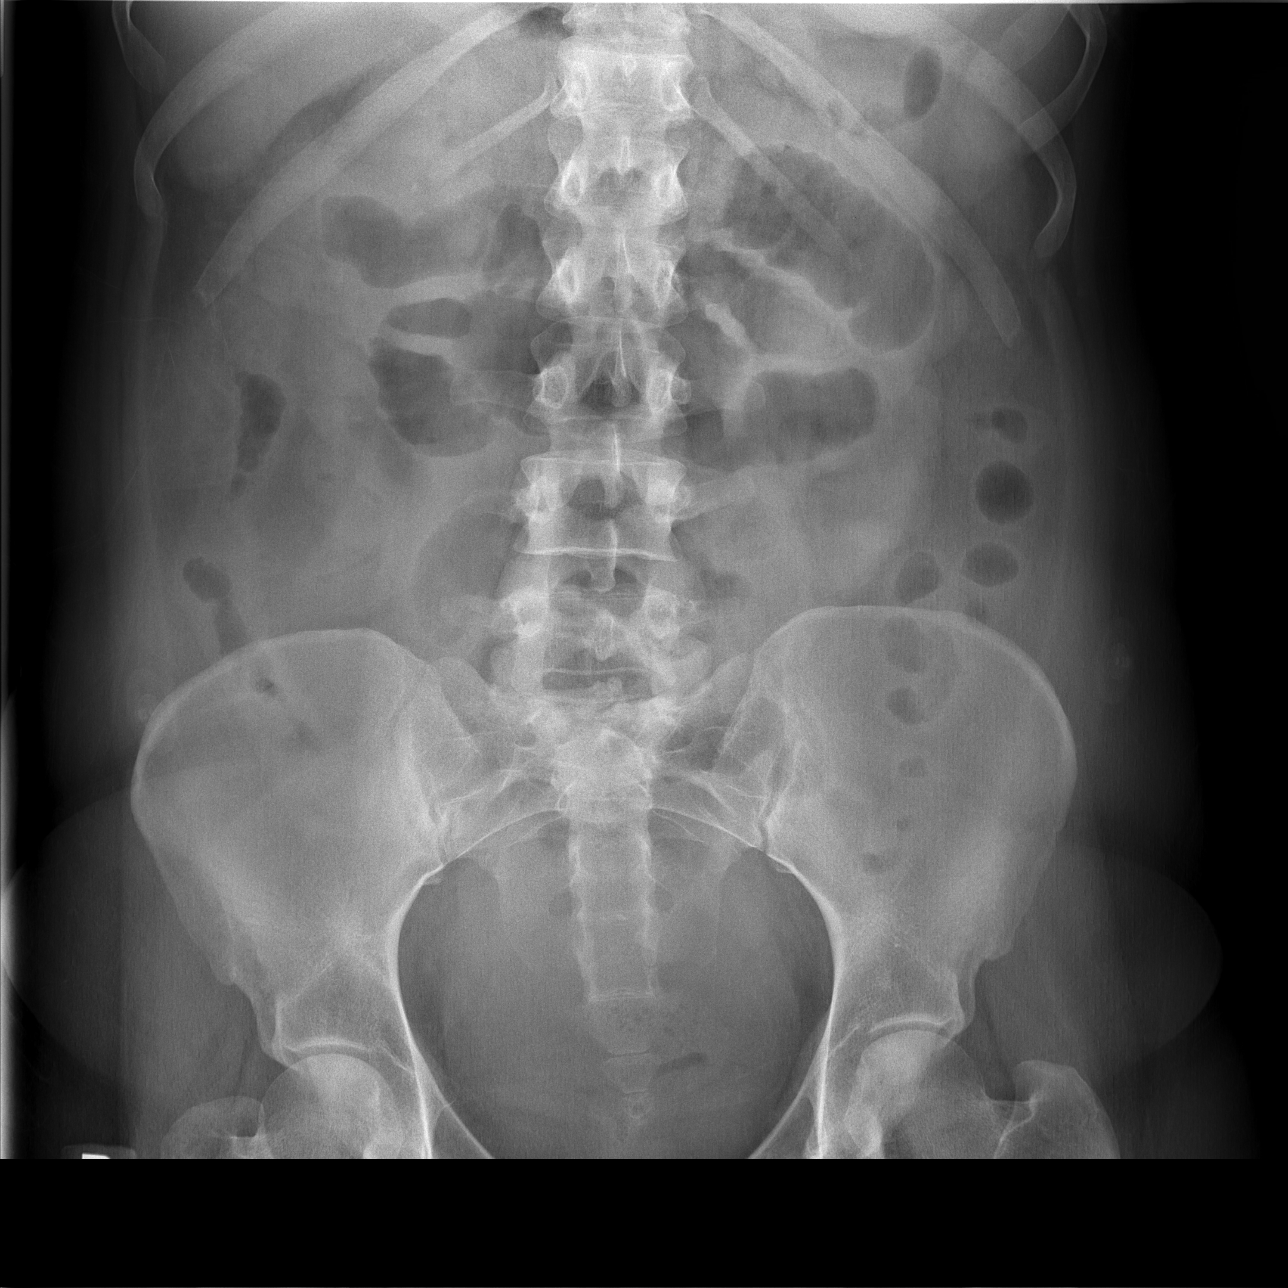

[view not recorded]
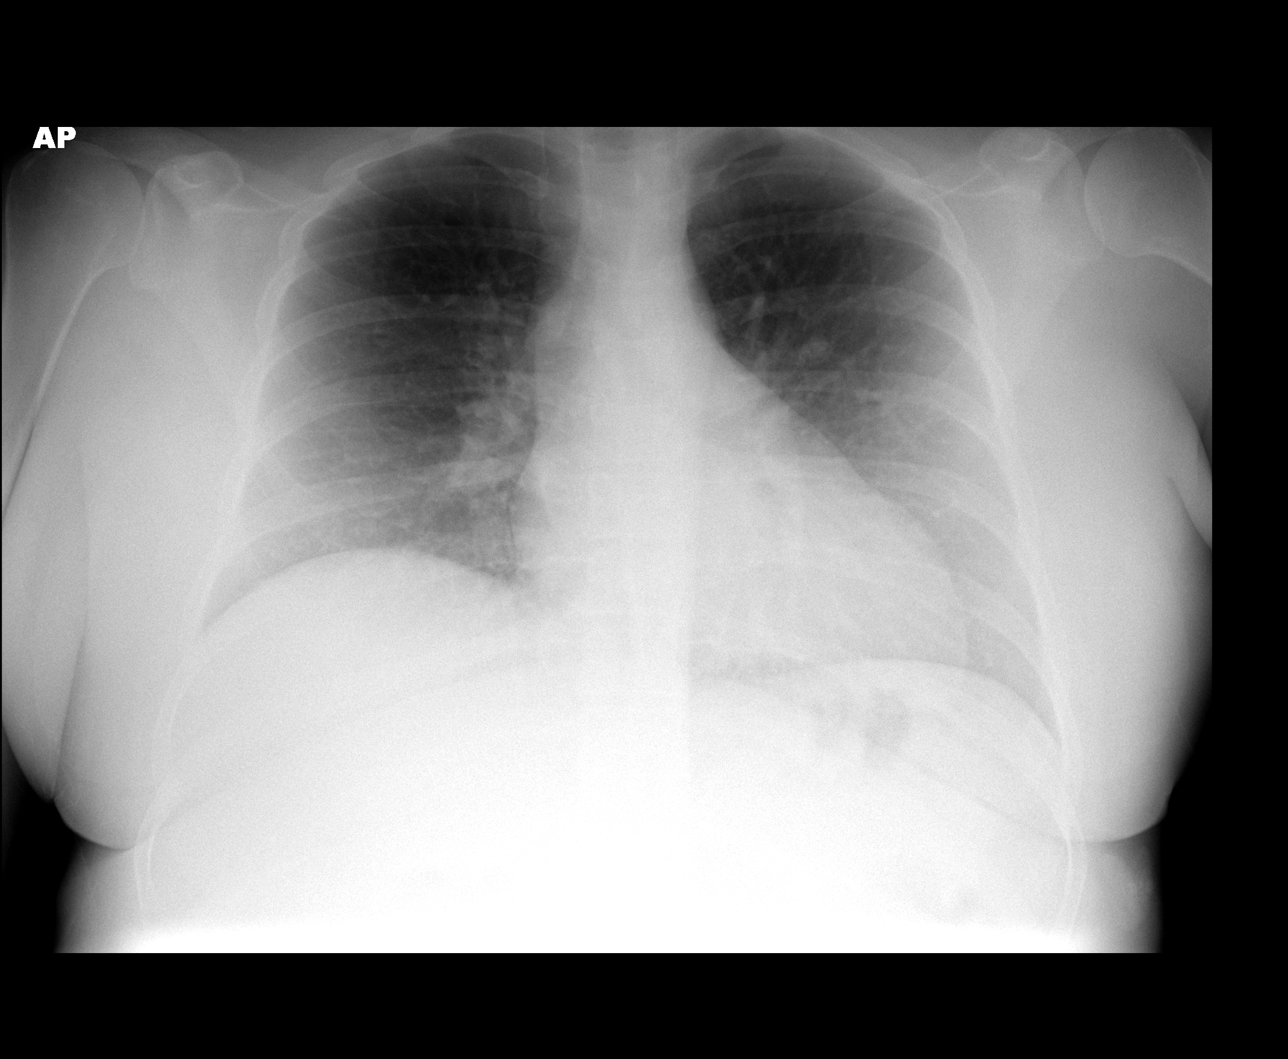

[3 of 3 positions shown; findings below may reference images not displayed]

FINDINGS: One-view chest shows normal heart and mediastinal
shadows.  Lungs are clear.  No effusions.  No free air seen under
the diaphragm.

Two-view abdominal films do not show any free air.  There is no
evidence of ileus or obstruction.  Small bowel gas pattern is very
slightly prominent, but not definitely pathologic.  No abnormal
calcifications or bony findings.
IMPRESSION: Slightly prominent small intestine bowel gas, but not definitely
pathologic.  No free air.

## 2010-02-08 IMAGING — CR DG TIBIA/FIBULA 2V*R*
4 series · 4 of 4 positions shown · non-contrast
Comparison: None.

CLINICAL DATA: Right ankle redness and swelling

RIGHT TIBIA AND FIBULA - 2 VIEW

[t tib/fib ap right (1 of 2)]
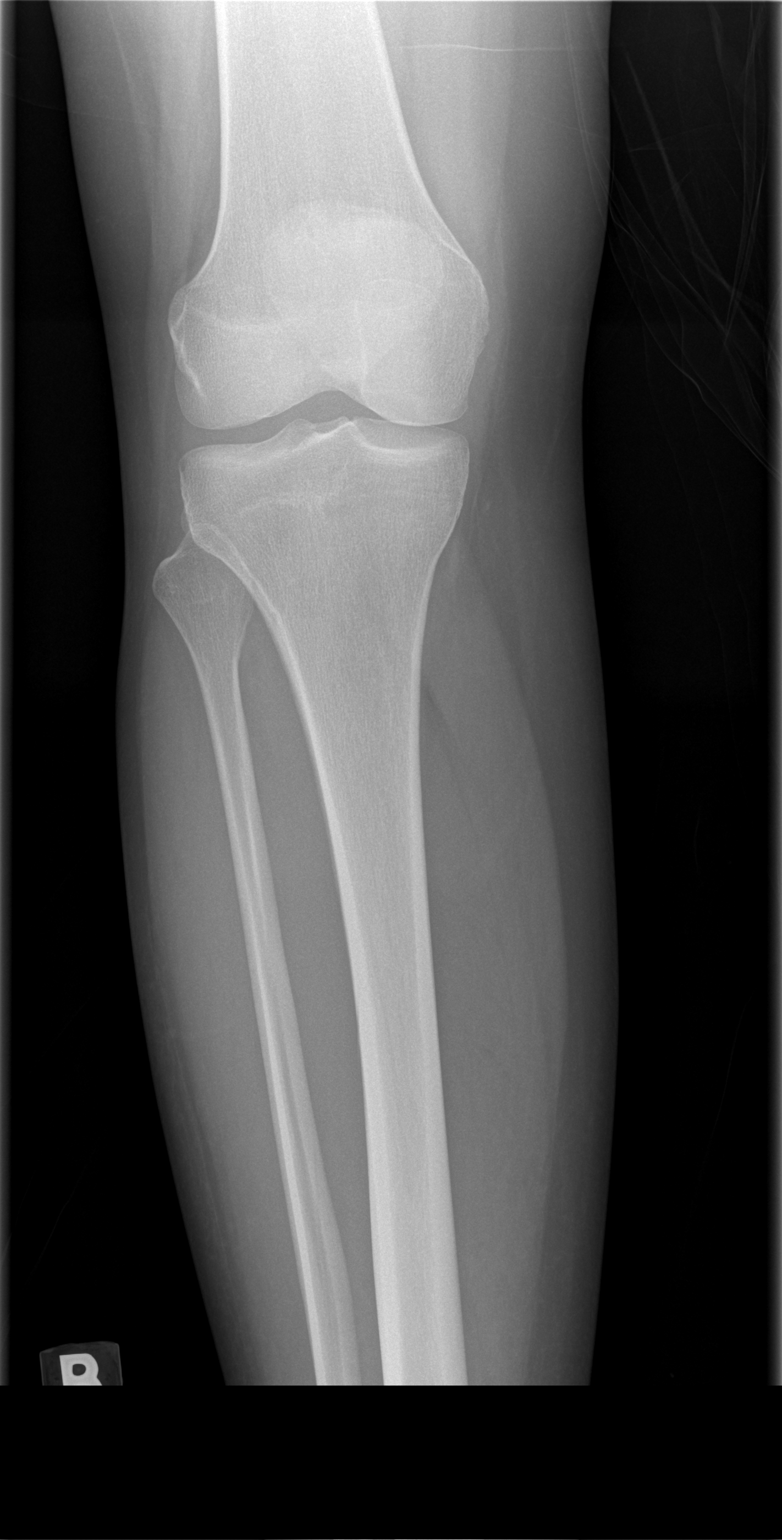

[t tib/fib ap right (2 of 2)]
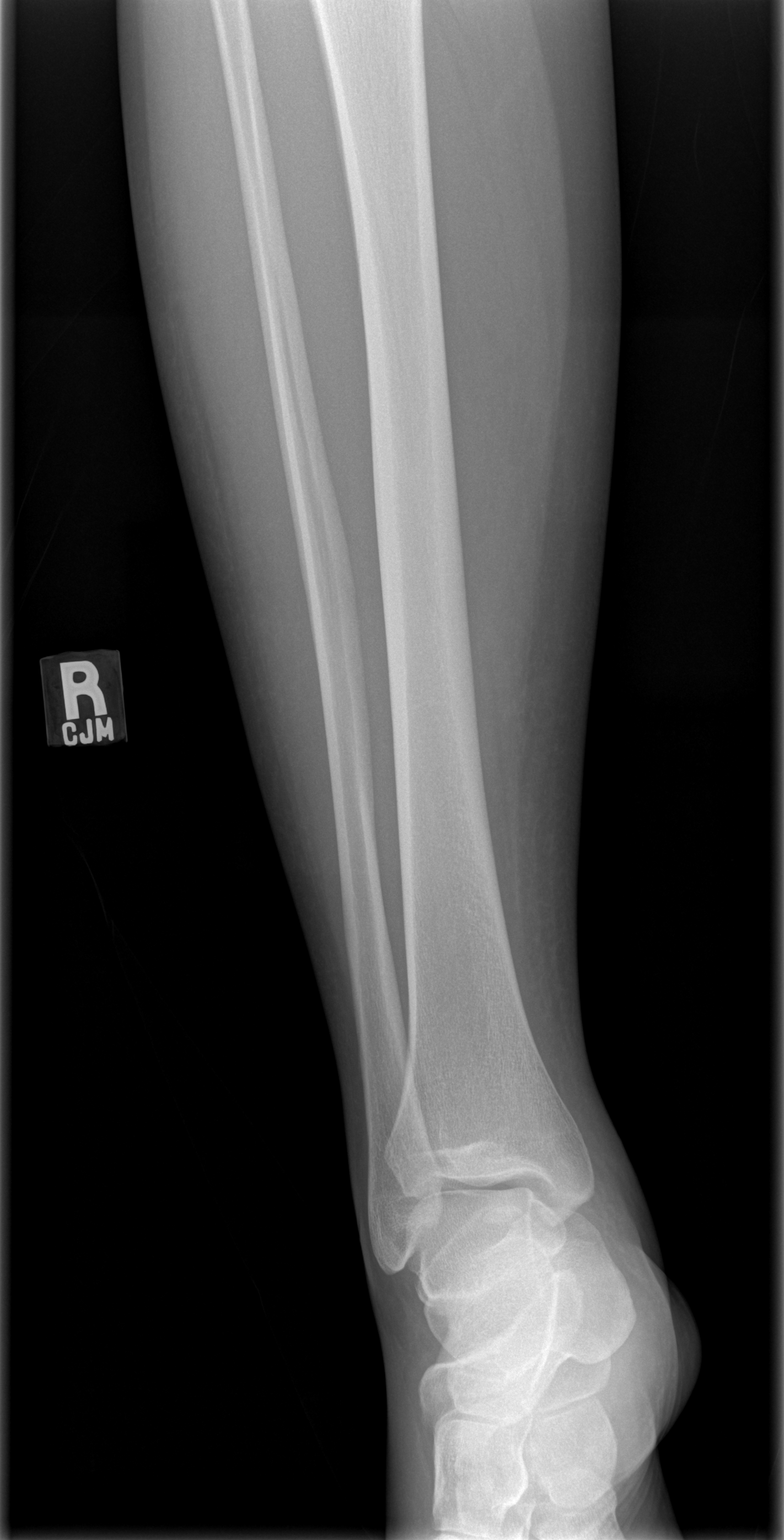

[t tib/fib lat right (1 of 2)]
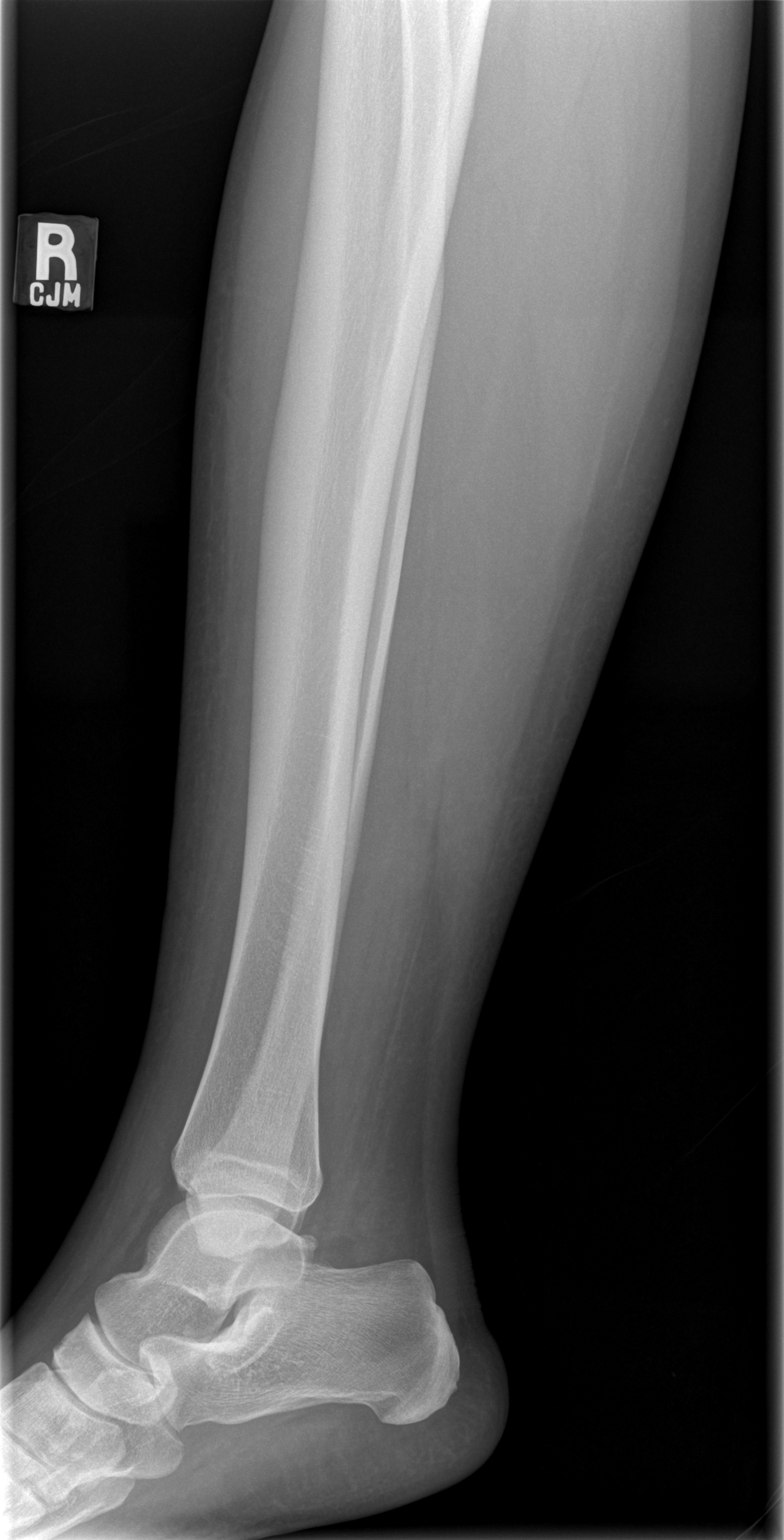

[t tib/fib lat right (2 of 2)]
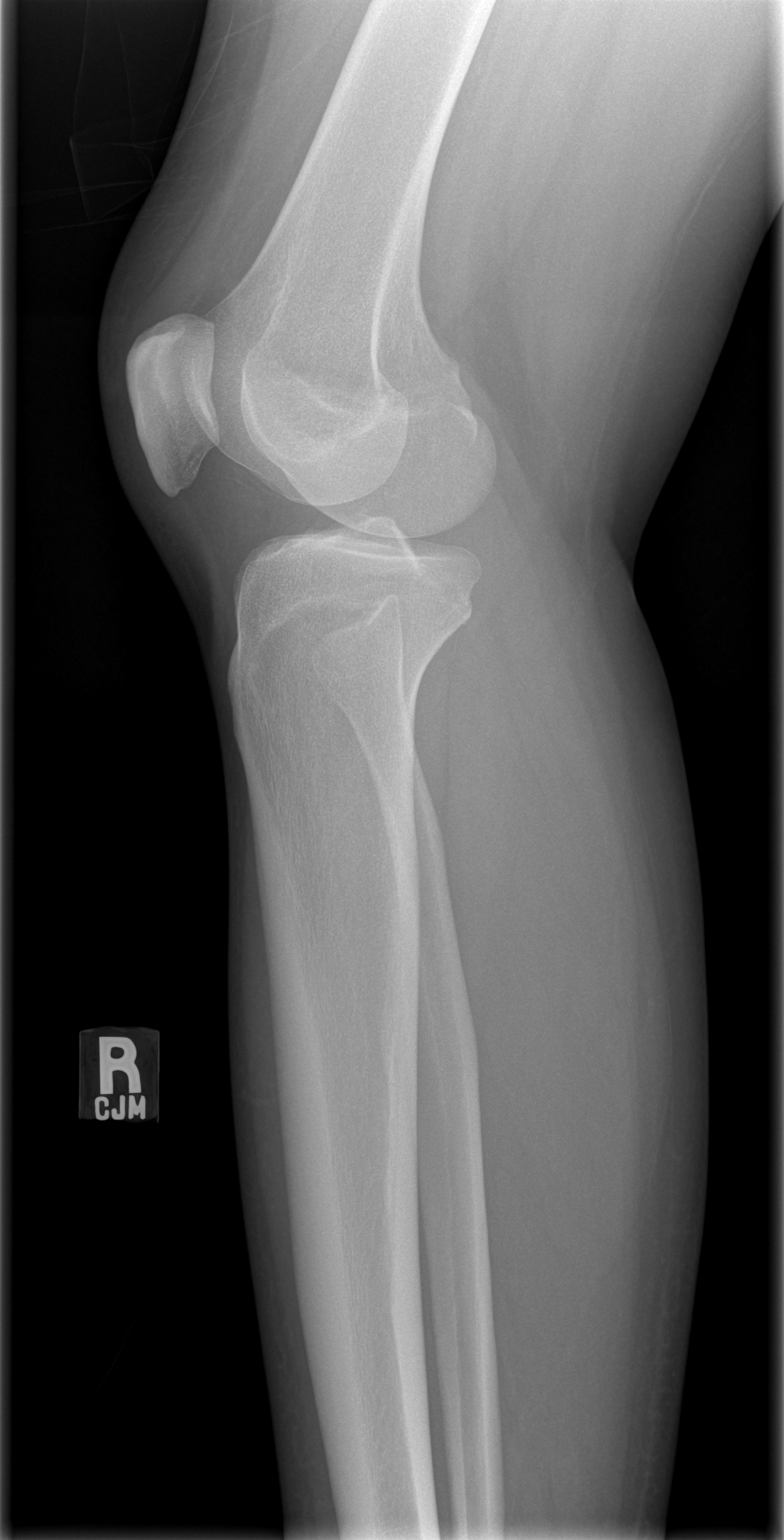

[4 of 4 positions shown; findings below may reference images not displayed]

FINDINGS: No fracture identified.  No radiopaque foreign body.  No
soft tissue abnormality.
IMPRESSION: Normal exam.

## 2010-02-09 IMAGING — CR DG CHEST 1V PORT
1 series · 1 of 1 positions shown · non-contrast
Comparison: None

CLINICAL DATA: PICC line placement

PORTABLE CHEST - 1 VIEW

[view not recorded]
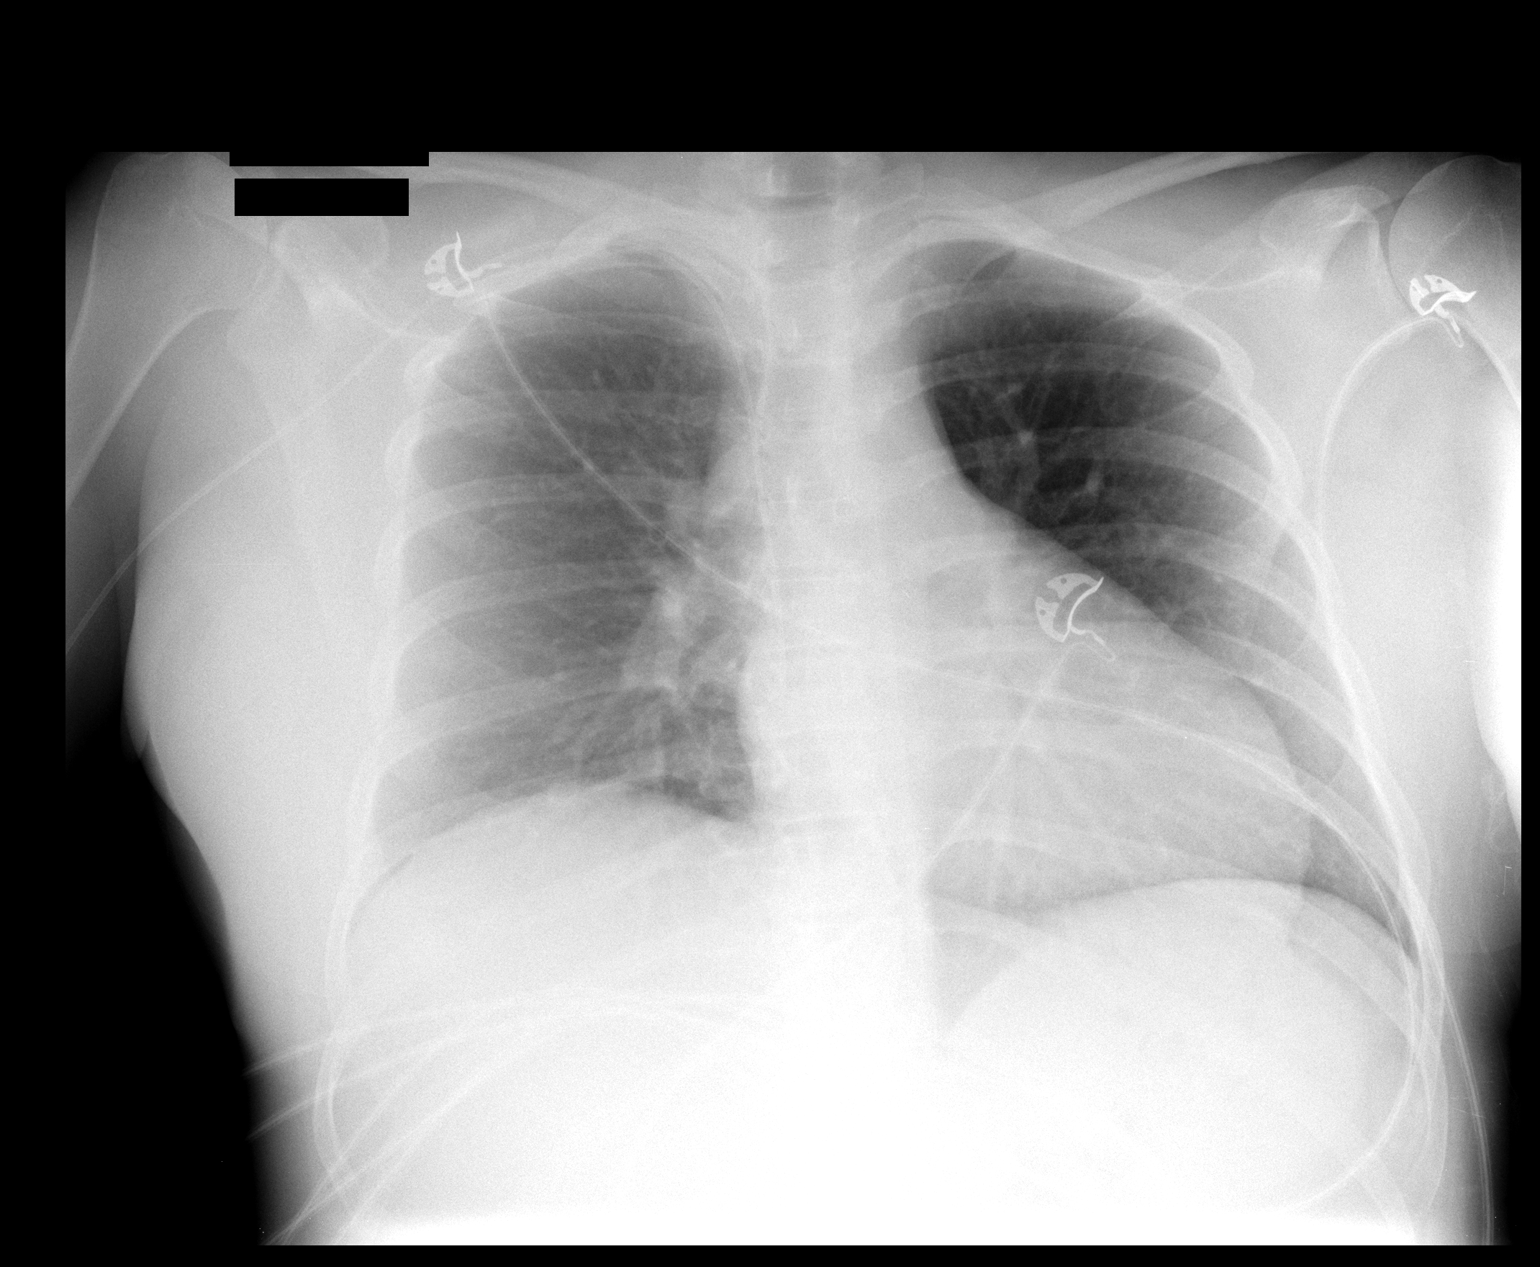

[1 of 1 positions shown; findings below may reference images not displayed]

FINDINGS: Heart size is normal.

No pleural effusions or pulmonary edema.

There is no airspace consolidation identified.

There is a right arm PICC line with tip in the cavoatrial junction.
IMPRESSION: 1.  The right arm PICC line is in satisfactory position with tip in
the cavoatrial junction.

## 2010-02-10 ENCOUNTER — Telehealth: Payer: Self-pay | Admitting: Gastroenterology

## 2010-02-10 ENCOUNTER — Telehealth: Payer: Self-pay | Admitting: Internal Medicine

## 2010-02-11 ENCOUNTER — Telehealth: Payer: Self-pay | Admitting: Internal Medicine

## 2010-02-11 ENCOUNTER — Encounter: Payer: Self-pay | Admitting: Internal Medicine

## 2010-02-12 ENCOUNTER — Emergency Department (HOSPITAL_COMMUNITY)
Admission: EM | Admit: 2010-02-12 | Discharge: 2010-02-12 | Payer: Self-pay | Source: Home / Self Care | Admitting: Family Medicine

## 2010-02-12 ENCOUNTER — Telehealth: Payer: Self-pay | Admitting: Internal Medicine

## 2010-02-12 ENCOUNTER — Encounter: Payer: Self-pay | Admitting: Internal Medicine

## 2010-02-14 ENCOUNTER — Emergency Department (HOSPITAL_COMMUNITY)
Admission: EM | Admit: 2010-02-14 | Discharge: 2010-02-14 | Payer: Self-pay | Source: Home / Self Care | Admitting: Family Medicine

## 2010-02-15 ENCOUNTER — Telehealth: Payer: Self-pay | Admitting: Internal Medicine

## 2010-02-16 ENCOUNTER — Ambulatory Visit: Payer: Self-pay | Admitting: Internal Medicine

## 2010-02-16 ENCOUNTER — Ambulatory Visit: Payer: Self-pay | Admitting: Gastroenterology

## 2010-02-23 ENCOUNTER — Telehealth: Payer: Self-pay | Admitting: Gastroenterology

## 2010-02-24 ENCOUNTER — Encounter (HOSPITAL_BASED_OUTPATIENT_CLINIC_OR_DEPARTMENT_OTHER): Admission: RE | Admit: 2010-02-24 | Discharge: 2010-04-30 | Payer: Self-pay | Admitting: General Surgery

## 2010-02-24 ENCOUNTER — Telehealth (INDEPENDENT_AMBULATORY_CARE_PROVIDER_SITE_OTHER): Payer: Self-pay | Admitting: *Deleted

## 2010-02-25 ENCOUNTER — Telehealth: Payer: Self-pay | Admitting: Gastroenterology

## 2010-03-08 ENCOUNTER — Encounter: Payer: Self-pay | Admitting: Gastroenterology

## 2010-03-15 ENCOUNTER — Ambulatory Visit: Payer: Self-pay | Admitting: Gastroenterology

## 2010-03-22 ENCOUNTER — Telehealth: Payer: Self-pay | Admitting: Internal Medicine

## 2010-03-25 ENCOUNTER — Telehealth: Payer: Self-pay | Admitting: Gastroenterology

## 2010-03-30 ENCOUNTER — Telehealth: Payer: Self-pay | Admitting: Gastroenterology

## 2010-04-08 ENCOUNTER — Telehealth: Payer: Self-pay | Admitting: Gastroenterology

## 2010-04-13 ENCOUNTER — Ambulatory Visit: Payer: Self-pay | Admitting: Internal Medicine

## 2010-04-20 ENCOUNTER — Telehealth: Payer: Self-pay | Admitting: Gastroenterology

## 2010-04-22 ENCOUNTER — Telehealth: Payer: Self-pay | Admitting: Gastroenterology

## 2010-04-23 ENCOUNTER — Telehealth: Payer: Self-pay | Admitting: Internal Medicine

## 2010-04-27 ENCOUNTER — Telehealth: Payer: Self-pay | Admitting: Gastroenterology

## 2010-04-27 ENCOUNTER — Telehealth: Payer: Self-pay | Admitting: Internal Medicine

## 2010-04-29 ENCOUNTER — Telehealth: Payer: Self-pay | Admitting: Gastroenterology

## 2010-04-29 ENCOUNTER — Encounter: Payer: Self-pay | Admitting: Gastroenterology

## 2010-05-03 ENCOUNTER — Telehealth: Payer: Self-pay | Admitting: Gastroenterology

## 2010-05-20 ENCOUNTER — Ambulatory Visit: Payer: Self-pay | Admitting: Internal Medicine

## 2010-05-20 DIAGNOSIS — L259 Unspecified contact dermatitis, unspecified cause: Secondary | ICD-10-CM | POA: Insufficient documentation

## 2010-05-20 HISTORY — DX: Unspecified contact dermatitis, unspecified cause: L25.9

## 2010-05-24 ENCOUNTER — Telehealth: Payer: Self-pay | Admitting: Gastroenterology

## 2010-05-28 ENCOUNTER — Telehealth: Payer: Self-pay | Admitting: Internal Medicine

## 2010-06-04 ENCOUNTER — Telehealth: Payer: Self-pay | Admitting: Gastroenterology

## 2010-06-18 ENCOUNTER — Telehealth: Payer: Self-pay | Admitting: Internal Medicine

## 2010-06-24 ENCOUNTER — Telehealth: Payer: Self-pay | Admitting: Internal Medicine

## 2010-06-25 ENCOUNTER — Telehealth: Payer: Self-pay | Admitting: Internal Medicine

## 2010-06-29 ENCOUNTER — Telehealth: Payer: Self-pay | Admitting: Gastroenterology

## 2010-07-20 ENCOUNTER — Ambulatory Visit: Payer: Self-pay | Admitting: Gastroenterology

## 2010-07-23 ENCOUNTER — Telehealth: Payer: Self-pay | Admitting: Internal Medicine

## 2010-07-30 ENCOUNTER — Ambulatory Visit: Payer: Self-pay | Admitting: Internal Medicine

## 2010-08-12 ENCOUNTER — Encounter (INDEPENDENT_AMBULATORY_CARE_PROVIDER_SITE_OTHER): Payer: Self-pay | Admitting: *Deleted

## 2010-08-12 DIAGNOSIS — H6690 Otitis media, unspecified, unspecified ear: Secondary | ICD-10-CM

## 2010-08-12 DIAGNOSIS — R062 Wheezing: Secondary | ICD-10-CM | POA: Insufficient documentation

## 2010-08-12 HISTORY — DX: Wheezing: R06.2

## 2010-08-12 HISTORY — DX: Otitis media, unspecified, unspecified ear: H66.90

## 2010-08-13 ENCOUNTER — Telehealth: Payer: Self-pay | Admitting: Internal Medicine

## 2010-08-23 ENCOUNTER — Telehealth: Payer: Self-pay | Admitting: Internal Medicine

## 2010-08-24 ENCOUNTER — Telehealth: Payer: Self-pay | Admitting: Internal Medicine

## 2010-08-30 ENCOUNTER — Telehealth: Payer: Self-pay | Admitting: Internal Medicine

## 2010-09-02 ENCOUNTER — Telehealth (INDEPENDENT_AMBULATORY_CARE_PROVIDER_SITE_OTHER): Payer: Self-pay | Admitting: *Deleted

## 2010-09-07 NOTE — Assessment & Plan Note (Signed)
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  1 mL given. Manufactured by [employer]. Pt will return on 09/24/09 for next injection.      Patient tolerated injection without complications    Given by: Francee Piccolo CMA [name]) (September 23, 2009 3:45 PM)  Orders Added: 1)  EMR miscellaneous medications [EMRORAL]

## 2010-09-07 NOTE — Progress Notes (Signed)
Summary: ABX?   Phone Note Call from Patient Call back at [facility] Phone [phone] Call back at Work Phone [phone]   Summary of Call: Pt continues to have pain and swelling in ankle and wants to know if she needs antibiotics? Pt recently seen by Dr Ronnald Ramp and was hospitalized.  Initial call taken by: Charlsie Quest, [place],  February 11, 2010 2:29 PM  Follow-up for Phone Call        I cannot tell just from this;  if having fever, especially with increased pain , swelling and redness she should consider OV to evaluate Follow-up by: Biagio Borg MD,  February 11, 2010 2:43 PM  Additional Follow-up for Phone Call Additional follow up Details #1::        Per pt, it is impossible to miss work. No fever. Left hospital AMA and was on IV antibiotics at that time. Advised UC for eval if she could not get here for eval. Pt says she will consider and call office back.  Additional Follow-up by: Charlsie Quest, CMA,  February 11, 2010 3:20 PM

## 2010-09-07 NOTE — Progress Notes (Signed)
Summary: Refill Clonazepam  Phone Note From Pharmacy   Summary of Call: Refill requested for Clonazepam 0.5 mg #60 from [facility] [address] Initial call taken by: Ashok Cordia RN,  August 26, 2009 12:08 PM    Prescriptions: KLONOPIN 0.5 MG TABS (CLONAZEPAM) 1 by mouth q 12 hours as needed anxiety/panic attack  #60 x 0   Entered by:   Ashok Cordia RN   Authorized by:   Mardella Layman MD [facility]   Signed by:   Ashok Cordia RN on 08/26/2009   Method used:   Printed then faxed to ...       [facility]  [address] [id number]       [address]       [place], [place]  [address]       Ph: [phone]       Fax: [phone]   RxID:   [id number]

## 2010-09-07 NOTE — Progress Notes (Signed)
Summary: ABX  Phone Note Call from Patient Call back at Home Phone [phone]   Caller: Patient Summary of Call: Pt called stating that her and her child has colds and she missed work today because of that. Pt cannot take anymore time off work to make appt and providers are fulltoday. Pt is requesting Rx for ABX to pharmacy, please advise Initial call taken by: Crissie Sickles, [place],  June 24, 2010 2:03 PM  Follow-up for Phone Call        ok this time only   done per emr Follow-up by: Biagio Borg MD,  June 24, 2010 4:52 PM  Additional Follow-up for Phone Call Additional follow up Details #1::        Pt informed  Additional Follow-up by: Charlsie Quest, CMA,  June 24, 2010 6:28 PM    New/Updated Medications: AZITHROMYCIN 250 MG TABS (AZITHROMYCIN) 2po qd for 1 day, then 1po qd for 4days, then stop Prescriptions: AZITHROMYCIN 250 MG TABS (AZITHROMYCIN) 2po qd for 1 day, then 1po qd for 4days, then stop  #6 x 1   Entered and Authorized by:   Biagio Borg MD   Signed by:   Biagio Borg MD on 06/24/2010   Method used:   [facility]ron[facility]        [facility] [place] [phone]retail[facility]       [address]       [place], [place]  [address]       Ph: [phone]       Fax: [phone]   RxID:   [id number]

## 2010-09-07 NOTE — Progress Notes (Signed)
Summary: refill request  Phone Note Call from Patient Call back at [phone]   Caller: Patient Call For: Dr. Sharlett Iles Reason for Call: Refill Medication Summary of Call: pt would like a refill of Percocet...  NOTE new address to mail the rx to:  [place]  [id number] Initial call taken by: Lucien Mons,  April 27, 2010 11:16 AM  Follow-up for Phone Call        patietn got Percocet #30 on 03/31/2010, do you want to refill rx? Follow-up by: Bernita Buffy CMA Deborra Medina),  April 27, 2010 11:31 AM  Additional Follow-up for Phone Call Additional follow up Details #1::        yes Additional Follow-up by: Sable Feil MD Trula Slade 12:18 PM    Additional Follow-up for Phone Call Additional follow up Details #2::    rx mailed to patients address in phone note Follow-up by: Bernita Buffy CMA Deborra Medina),  April 27, 2010 12:38 PM  Prescriptions: PERCOCET 5-325 MG TABS (OXYCODONE-ACETAMINOPHEN) Take 1 tab evey 6-8 hours as needed  #30 x 0   Entered by:   Bernita Buffy CMA ([place])   Authorized by:   Sable Feil MD [facility]   Signed by:   Bernita Buffy CMA ([place]) on 04/27/2010   Method used:   Print then Mail to Patient   RxID:   [id number]

## 2010-09-07 NOTE — Progress Notes (Signed)
Summary: Oxycode refill  Phone Note Call from Patient Call back at [facility] Phone [phone]   Call For: Dr Jarold Motto Reason for Call: Refill Medication Summary of Call: Oxycode refill Initial call taken by: Leanor Kail [facility],  Dec 17, 2009 12:43 PM  Follow-up for Phone Call        Pt notified.  Rx left at front desk for pt to pick up. Follow-up by: Ashok Cordia RN,  Dec 17, 2009 1:07 PM    Prescriptions: PERCOCET 5-325 MG TABS (OXYCODONE-ACETAMINOPHEN) Take 1 tab evey 6-8 hours as needed  #20 x 0   Entered by:   Ashok Cordia RN   Authorized by:   Mardella Layman MD [facility]   Signed by:   Ashok Cordia RN on 12/17/2009   Method used:   Print then Give to Patient   RxID:   [id number]

## 2010-09-07 NOTE — Progress Notes (Signed)
Summary: Med Question  Phone Note Call from Patient Call back at [phone] ext [phone]   Caller: Patient Call For: Dr Sharlett Iles Reason for Call: Talk to Nurse Details for Reason: Prescription Summary of Call: Pt wants to know if she can be prescribed Librax or Amitiza; If either medication would be beneficial for her symptoms Initial call taken by: Cora Daniels,  November 03, 2009 12:39 PM  Follow-up for Phone Call        LM for pt to call. Butch Penny Surface RN  November 04, 2009 8:54 AM  Pt asking if Librax would be helpful for her to take to cut down on some of the diarrhea she is having after meals.  (Co worker told her that she  takes this and it is helpful)  If librax is not appropriate for pt is there something else she can use before she eats to help woth diarrhea? Follow-up by: Alberteen Spindle RN,  November 04, 2009 9:02 AM  Additional Follow-up for Phone Call Additional follow up Details #1::        lomotil  q 8h as needed is .Marland Kitchenok Additional Follow-up by: Sable Feil MD Marval Regal,  November 04, 2009 9:08 AM    Additional Follow-up for Phone Call Additional follow up Details #2::    Lm for pt that Rx will be sent in and to call if has questions. Alberteen Spindle RN  November 04, 2009 9:29 AM  Rs faxed to pharmacy. Follow-up by: Alberteen Spindle RN,  November 04, 2009 12:02 PM  New/Updated Medications: LOMOTIL 2.5-0.025 MG  TABS (DIPHENOXYLATE-ATROPINE) 1 q 8 hrs as needed dairrhea Prescriptions: LOMOTIL 2.5-0.025 MG  TABS (DIPHENOXYLATE-ATROPINE) 1 q 8 hrs as needed dairrhea  #60 x 3   Entered by:   Alberteen Spindle RN   Authorized by:   Sable Feil MD [facility]   Signed by:   Alberteen Spindle RN on 11/04/2009   Method used:   Printed then faxed to ...       [facility] [place] [id number]       [address]       [place], [place]  [address]       Ph: [phone]       Fax: [phone]   RxID:   [id number]

## 2010-09-07 NOTE — Progress Notes (Signed)
Summary: Face is swollen  Phone Note Call from Patient Call back at Home Phone [phone]   Call For: Dr Jarold Motto Reason for Call: Talk to Nurse Summary of Call: Can she do for swelling in her face? She is taking steroids. Initial call taken by: Leanor Kail [facility],  May 22, 2009 10:37 AM  Follow-up for Phone Call        Talked with pt.  currently taking 30 mg prednisone. Instructed pt to limit salt intake.  Symptoms should lessen as dose of prednisone is decreased.  Will soon be starting on Humira. (trying to get pt assistance) Follow-up by: Ashok Cordia RN,  May 22, 2009 11:41 AM

## 2010-09-07 NOTE — Assessment & Plan Note (Signed)
Summary: 3-WEEK F/U APPT...LSW.   History of Present Illness Visit Type: Follow-up Visit Primary GI MD: Sheryn Bison MD FACP FAGA Primary Provider: Oliver Barre, MD  Requesting Provider: n/a Chief Complaint: F/u for Crohn's, and B12 Def. Pt denies any GI complaints  History of Present Illness:   Adraine denies gastrointestinal complaints at this time and has not been on Humira in 8 weeks. She Was Seen at the [facility] wound clinic by Dr. Cheryll Cockayne and had some debridement of her right anterior tibial wound with appropriate dressing. She has kept her wound covered with a bandage, and denies pain in her leg or drainage or fever, chills et Karie Soda. She did not keep her appointment for followup today.  She is on 15 medications listed and reviewed her record at this time. She is having regular bowel movements without abdominal pain or rectal bleeding at this time.   GI Review of Systems      Denies abdominal pain, acid reflux, belching, bloating, chest pain, dysphagia with liquids, dysphagia with solids, heartburn, loss of appetite, nausea, vomiting, vomiting blood, weight loss, and  weight gain.        Denies anal fissure, black tarry stools, change in bowel habit, constipation, diarrhea, diverticulosis, fecal incontinence, heme positive stool, hemorrhoids, irritable bowel syndrome, jaundice, light color stool, liver problems, rectal bleeding, and  rectal pain.    Current Medications (verified): 1)  Nexium 40 Mg Cpdr (Esomeprazole Magnesium) .... Take One By Mouth Two Times A Day 2)  Lisinopril 10 Mg Tabs (Lisinopril) .Marland Kitchen.. 1 By Mouth Qd 3)  Klonopin 0.5 Mg Tabs (Clonazepam) .Marland Kitchen.. 1 By Mouth Q 12 Hours As Needed Anxiety/panic Attack 4)  Humira 40 Mg/0.8ml Kit (Adalimumab) .... Every 2 Weeks(On Hold) 5)  Thiamine Hcl 100 Mg/ml Soln (Thiamine Hcl) .Marland Kitchen.. 100 Mg Im Daily X 5 Days, Then 100 Mg Monthly 6)  Cyanocobalamin 1000 Mcg/ml Soln (Cyanocobalamin) .... As Directed 7)  Caltrate 600 1500 Mg  Tabs (Calcium Carbonate) .... One Tablet By Mouth Two Times A Day 8)  Percocet 5-325 Mg Tabs (Oxycodone-Acetaminophen) .... Take 1 Tab Evey 6-8 Hours As Needed 9)  Lomotil 2.5-0.025 Mg  Tabs (Diphenoxylate-Atropine) .Marland Kitchen.. 1 Q 8 Hrs As Needed Dairrhea 10)  Flexeril 10 Mg Tabs (Cyclobenzaprine Hcl) .... Take 1 Three Times A Day As Needed 11)  Promethazine Hcl 12.5 Mg Tabs (Promethazine Hcl) .... Take 1 Q 4 Hours As Needed 12)  Celebrex 200 Mg Caps (Celecoxib) .Marland Kitchen.. 1po Two Times A Day As Needed 13)  Adderall 10 Mg Tabs (Amphetamine-Dextroamphetamine) .Marland Kitchen.. 1 By Mouth Two Times A Day - To Fill February 15, 2010 14)  Klor-Con M10 10 Meq Cr-Tabs (Potassium Chloride Crys Cr) .Marland Kitchen.. 1 Tab By Mouth Four Times A Day 15)  Sumatriptan Succinate 100 Mg Tabs (Sumatriptan Succinate) .Marland Kitchen.. 1 By Mouth Every Other Day As Needed 16)  Slow-Mag 71.5-119 Mg Tbec (Magnesium Cl-Calcium Carbonate) .... 2 Tabs Daily  Allergies (verified): 1)  ! Penicillin 2)  ! Doxycycline  Past History:  Past medical, surgical, family and social histories (including risk factors) reviewed for relevance to current acute and chronic problems.  Past Medical History: Reviewed history from 12/02/2009 and no changes required. B12 DEFICIENCY  CROHN'S DISEASE (ICD-555.9)/SMALL BOWEL GERD ANXIETY CHRONIC HEADACHES ADD hypertension    MD rooster: gi - [facility] - [facility], prev [facility]  Past Surgical History: Reviewed history from 12/02/2009 and no changes required. Endometrial Ablation (01/2009) Breast Implants-2004  Family History: Reviewed history  from 04/28/2009 and no changes required. Family History of Breast Cancer: Mother Family History of Esophageal Cancer: Maternal Grandfather Family History of Stomach Cancer: Maternal Grandfather Family History of Clotting disorder: Mother No Family History of IBD No FH of Colon Cancer:  Social History: Reviewed history from 12/02/2009 and no changes  required. Occupation: Research scientist (physical sciences) at [employer] Patient currently smokes. -5 cig. daily Alcohol Use - yes-rare Daily Caffeine Use-1 cups daily Illicit Drug Use - no Patient does not get regular exercise.  lives with financee and baby son (born 11/2008)  Review of Systems       The patient complains of allergy/sinus, anxiety-new, fatigue, headaches-new, and sleeping problems.  The patient denies anemia, arthritis/joint pain, back pain, blood in urine, breast changes/lumps, change in vision, confusion, cough, coughing up blood, depression-new, fainting, fever, hearing problems, heart murmur, heart rhythm changes, itching, menstrual pain, muscle pains/cramps, night sweats, nosebleeds, pregnancy symptoms, shortness of breath, skin rash, sore throat, swelling of feet/legs, swollen lymph glands, thirst - excessive , urination - excessive , urination changes/pain, urine leakage, vision changes, and voice change.    Vital Signs:  Patient profile:   30 year old female Height:      69 inches Weight:      150 pounds BMI:     22.23 BSA:     1.83 Pulse rate:   98 / minute Pulse rhythm:   regular BP sitting:   128 / 76  (left arm) Cuff size:   regular  Vitals Entered By: Ok Anis CMA (March 15, 2010 4:23 PM)  Physical Exam  General:  Well developed, well nourished, no acute distress.healthy appearing.   Head:  Normocephalic and atraumatic. Eyes:  PERRLA, no icterus.exam deferred to patient's ophthalmologist.   Abdomen:  Soft, nontender and nondistended. No masses, hepatosplenomegaly or hernias noted. Normal bowel sounds. Msk:  The wound on her right leg appears smaller than previous exam but has no elliptical shape and approximately 1-1/2 to 2 centimeters in length an approximately 3/4 of a centimeter wide. There is no drainage or purulence, evidence of cellulitis or thrombophlebitis. Neurologic:  Alert and  oriented x4;  grossly normal neurologically. Psych:  Alert and cooperative.  Normal mood and affect.   Impression & Recommendations:  Problem # 1:  CELLULITIS AND ABSCESS OF LEG EXCEPT FOOT (ICD-682.6) Assessment Improved Followup at wound clinic suggested. Her wound appears to be healing appropriately and does not appear to be infected. She needs to restart her Humira every 2 weeks before she has a flare of her Crohn's disease, and we will go ahead and proceed as tolerated. She is on multiple other medications that have been reviewed and will be continued also. I have urged followup with the wound clinic if possible. She is hampered in her medical care with job restrictions per her employer.  Problem # 2:  CROHN'S DISEASE-SMALL INTESTINE (ICD-555.0) Assessment: Improved resume Humira 40 mg every other week as tolerated.  Problem # 3:  HYPERTENSION (ICD-401.9) Assessment: Improved blood pressure today Norma 128/76 and she is to continue lisinopril 10 mg a day with potassium replacement and magnesium replacement.  Other Orders: Vit B12 1000 mcg (Z6109)  Patient Instructions: 1)  Restart Humira every 2 weeks. 2)  Follow up with the Wound Clinic. 3)  The medication list was reviewed and reconciled.  All changed / newly prescribed medications were explained.  A complete medication list was provided to the patient / caregiver. 4)  Copy sent to : Dr. Molly Maduro  Sevier at the wound clinic and Dr. Oliver Barre. 5)  Please schedule a follow-up appointment in 2 months.  6)  Please continue current medications.    Medication Administration  Injection # 1:    Medication: Vit B12 1000 mcg    Diagnosis: B12 DEFICIENCY (ICD-266.2)    Route: IM    Site: L deltoid    Exp Date: 04/13    Lot #: [id number]    Mfr: AAp Pharm    Patient tolerated injection without complications    Given by: Ashok Cordia RN (March 15, 2010 5:05 PM)  Injection # 2:    Medication: Thiamine    Diagnosis: VITAMIN B1 DEFICIENCY (ICD-265.1)    Route: IM    Site: RUOQ gluteus    Exp Date: 11/12     Lot #: [id number]    Mfr: AAP pharm    Comments: 100 mg    Patient tolerated injection without complications    Given by: Ashok Cordia RN (March 15, 2010 5:07 PM)  Orders Added: 1)  Vit B12 1000 mcg [J3420]

## 2010-09-07 NOTE — Assessment & Plan Note (Signed)
Summary: #2 thiamine injection.  (daily inj x 5)  See Lupita Leash  Nurse Visit   Medication Administration  Injection # 1:    Medication: EMR miscellaneous medications    Diagnosis: VITAMIN B1 DEFICIENCY (ICD-265.1)    Route: IM    Site: R thigh    Exp Date: 10/12    Lot #: [id number]    Comments: Thiamine 200mg /mL-1.0 ml given.  Pt states injection burns less in thigh.  Pt will request injection in thigh from now on.  Pt will return on 2/16 for next injection.    Patient tolerated injection without complications    Given by: Francee Piccolo CMA [name]) (September 22, 2009 2:52 PM)  Orders Added: 1)  EMR miscellaneous medications [EMRORAL]

## 2010-09-07 NOTE — Procedures (Signed)
Summary: Egd and Pathology   EGD  Procedure date:  08/22/2001  Findings:      Location: [facility]   Patient Name: [name], [name]. MRN: [id number] Procedure Procedures: Panendoscopy (EGD) CPT: 43235.    with biopsy(s)/brushing(s). CPT: D1846139.  Personnel: Endoscopist: Vania Rea. Jarold Motto, MD.  Exam Location: Exam performed in Endoscopy Suite.  Patient Consent: Procedure, Alternatives, Risks and Benefits discussed, consent obtained,  Indications Symptoms: Early satiety. Nausea. Vomiting. Reflux symptoms  History  Pre-Exam Physical: Performed Aug 22, 2001  Cardio-pulmonary exam, Abdominal exam, Extremity exam, Mental status exam WNL.  Exam Exam Info: Maximum depth of insertion Duodenum, intended Duodenum. Patient position: on left side. Duration of exam: 15 minutes. Vocal cords visualized. Gastric retroflexion performed. Images taken. ASA Classification: I. Tolerance: excellent.  Sedation Meds: Cetacaine Spray 2 sprays Fentanyl 150 mcg. Versed 10 mg.  Monitoring: BP and pulse monitoring done. Oximetry used. Supplemental O2 given  Fluoroscopy: Fluoroscopy was not used.  Instrument(s): GIF 160. Serial [id number].   Findings - ESOPHAGEAL INFLAMMATION: as a result of reflux. Severity is moderate, erosions present.  Length of inflammation: 3 cm. [place] Classification: Grade B. ICD9: Esophagitis, Reflux: 530.11.  - DIAGNOSTIC TEST: from Body. RUT done, results pending  - DIAGNOSTIC TEST: Biopsies taken. from Duodenal Apex to Duodenal 2nd Portion. Reason: R/O sprue.   Assessment  Diagnoses: 530.11: Esophagitis, Reflux.   Comments: Possible eating disorder should be considered here. Events  Unplanned Intervention: No unplanned interventions were required.  Plans Medication(s): PPI: Pantoprazole/Protonix 40 mg BID, starting Aug 22, 2001 for indefinitely.   Patient Education: Patient given standard instructions for: Reflux.   Disposition: After procedure patient sent to recovery.  Comments: F/U Dr. Rondel Jumbo  This report was created from the original endoscopy report, which was reviewed and signed by the above listed endoscopist.   Appended Document: [facility] EGD 08/22/2001 SP Surgical Pathology - STATUS: Final             By: Threasa Beards  ,        Perform Date: 15Jan03 09:33  Ordered By: Juanetta Beets        Ordered Date:  Facility: [facility]                              Department: CPATH  Service Report Text  [facility]   [address] [place], [place] [address]   [phone]    REPORT OF SURGICAL PATHOLOGY    Case #: [id number]   Patient Name: [name], [name]   PID: [id number]   Pathologist: Havery Moros, MD   DOB/Age August 28, 1980 (Age: 30) Gender: F   Date Taken: 08/22/2001   Date Received: 08/22/2001    FINAL DIAGNOSIS    ***MICROSCOPIC EXAMINATION AND DIAGNOSIS***    SMALL BOWEL BIOPSY: BENIGN SMALL BOWEL MUCOSA. NO VILLOUS   ATROPHY, INFLAMMATION OR OTHER ABNORMALITIES PRESENT.    jn   Date Reported: 08/23/2001 Havery Moros, MD   *** Electronically Signed Out By [facility] ***    Clinical information   R/O sprue. (tmc)    specimen(s) obtained   Small bowel, biopsy    Gross Description   Received in formalin are tan, soft tissue fragments that are   submitted in toto. Number: 2   Size: 0.2 and 0.3 cm. One cassette. (JBM:caf 08/22/01)    cf/

## 2010-09-07 NOTE — Progress Notes (Signed)
Summary: change vx appt  Phone Note Call from Patient Call back at Home Phone [phone]   Caller: Patient Call For: Dr. Jarold Motto Reason for Call: Talk to Nurse Summary of Call: would like to know if she can change her B12 and B1 appt without altering her vx schedule Initial call taken by: Vallarie Mare,  October 20, 2009 9:44 AM  Follow-up for Phone Call        Pt would like to change appt for injections from Friday to Thursday. this has been done. Follow-up by: Ashok Cordia RN,  October 20, 2009 10:34 AM

## 2010-09-07 NOTE — Procedures (Signed)
Summary: Colonoscopy   Colonoscopy  Procedure date:  04/01/2009  Findings:      Location:  [facility].   COLONOSCOPY PROCEDURE REPORT  PATIENT:  [name], [name]  MR#:  [id number] BIRTHDATE:   April 08, 1981, 27 yrs. old   GENDER:   female  ENDOSCOPIST:   Vania Rea. Jarold Motto, MD, [facility] Referred by:   PROCEDURE DATE:  04/01/2009 PROCEDURE:  Colonoscopy with biopsy ASA CLASS:   Class II INDICATIONS: abnormal CT of abdomen, abdominal pain, unexplained diarrhea   MEDICATIONS:    Fentanyl 125 mg IV, Versed 14 mg IV, Benadryl 50 mg IV  DESCRIPTION OF PROCEDURE:   After the risks benefits and alternatives of the procedure were thoroughly explained, informed consent was obtained.  Digital rectal exam was performed and revealed no abnormalities.   The LB [id number] endoscope was introduced through the anus and advanced to the terminal ileum which was intubated for a short distance, VERY,VERY HIGH DRUG TOLERANCE.LIMITED SEDATION POSSIBLE WITHOUT PROFOPOL.  The quality of the prep was excellent, using MoviPrep.  The instrument was then slowly withdrawn as the colon was fully examined. <<PROCEDUREIMAGES>>      <<OLD IMAGES>>  FINDINGS:  There were inflammatory changes in the ileum consistent wih Crohn's disease. EROSIONS AND SOME STRICTURING NOTED.BX. DONE.   Retroflexed views in the rectum revealed no abnormalities.    The scope was then withdrawn from the patient and the procedure completed.  COMPLICATIONS:   None  ENDOSCOPIC IMPRESSION:  1) Ileitis - Crohn's RECOMMENDATIONS:  1) await biopsy results  TREATMENT HERE COMPOUNDED BY HIGH DRUG TOLERANCE AND POSSIBLE UNDERLYING PSYCH PROBLEMS,I DOUBT SHE WILL TOLERATE PREDNSONE PER SIDE EFFECTS.WILL HAVE HER RETURN TO DISCUSS REMICADE RX.WILL NEED CXR AND PPD FIFST.  REPEAT EXAM:   No   _______________________________ Vania Rea. Jarold Motto, MD, [facility]  CC:       REPORT OF SURGICAL PATHOLOGY   Case #:  [id number] Patient Name: [name]. Office Chart Number:  N/A [id number] MRN: [id number] Pathologist: H. 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  Sections show chronic minimally active ileitis characterized by significant crypt architectural distortion, focal expansion of the lamina propria by lymphocytes, plasma cells, and pyloric gland metaplasia.  No granuloma is seen.  The overall findings are most consistent with Crohn' s disease.  No dysplasia is seen.  Clinical and endoscopic correlation is highly recommended.  (HC:kv 04-03-09)   kv Date Reported:  04/03/2009     H. Hollice Espy, MD *** Electronically Signed Out By [facility] ***   Clinical information Watery diarrhea, weight loss, rule out Crohn' s (mj)   specimen(s) obtained Ileum, biopsy   Gross Description Received in formalin are tan, soft tissue fragments that are submitted in toto.   Number:  four. Size:0.3 cm  (SP:mw 04/02/09)    mw/     Signed by Mardella Layman MD FACG,FAGA on 04/06/2009 at 12:48 PM  ________________________________________________________________________ NOTED   Signed by Mardella Layman MD FACG,FAGA on 04/06/2009 at 12:48 PM  ________________________________________________________________________  April 06, 2009 MRN: [id number]    [facility] [address] [place], [place]  [address]    Dear Ms. [name],  I am pleased to inform you that the biopsies taken during your recent colonoscopy did not show any evidence of cancer upon pathologic examination.  Additional information/recommendations:  __No further  action is needed at this time.  Please follow-up with      your primary care physician for your other healthcare needs.  __Please call [phone] to schedule a return visit to review      your condition.  _XX_Continue with the treatment plan as outlined on the day of your      exam.  __You should have a repeat colonoscopy examination for this problem           in _ years.  Please call us if you are having persistent problems or have questions about your condition that have not been fully answered at this time.  Sincerely,  Mardella Layman MD [facility]   This letter has been electronically signed by your physician.   This report was created from the original endoscopy report, which was reviewed and signed by the above listed endoscopist.

## 2010-09-07 NOTE — Assessment & Plan Note (Signed)
Summary: Thiamine 100 mg daily , #1 of 5/dfs    265.1  Nurse Visit   Allergies: 1)  ! 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  Pt states that the injection burned when administered.    Patient tolerated injection without complications    Given by: PChristian MateCMA (Deborra Medina (September 21, 2009 3:58 PM)  Orders Added: 1)  EMR miscellaneous medications [EMRORAL]

## 2010-09-07 NOTE — Assessment & Plan Note (Signed)
Summary: F/U  Crohn's flare, saw Amy Esterwood PA-C   History of Present Illness Visit Type: follow up  Primary GI MD: Sheryn Bison MD FACP FAGA Primary Provider: n/a Requesting Provider: n/a Chief Complaint: F/u for crohn's flare.  Pt c/o fatigue  History of Present Illness:   [name] continues on Humira 40 mg every 2 weeks and is doing much better symptomatically. She had a recent flare which may be stress related, and she was seen by a physician's assistant and had normal labs including CRP. She has chronic pain syndrome and is taking Percocet And Tramadol along with Klonopin 0.5 mg twice a day for panic attacks. She recently placed on Bentyl 10 mg t.i.d. for presumed bowel spasms.  She continues to do well and denies diarrhea or systemic complaints. Repeat labs showed correction of her previous severe hypokalemia associated with her low magnesium levels associated with her chronic diarrhea of unknown inflammatory bowel disease. Her main complaint today is of chronic fatigue despite B12 replacement and multivitamins. She does have a history of penicillin allergy. She denies current skin rashes, joint pains, or mouth sores, upper GI complaints as long as she takes daily Nexium, and no hepatobiliary complaints, or systemic complaints such as fever or chills. She is on birth control injections and does not apparently have menstrual periods.   GI Review of Systems      Denies abdominal pain, acid reflux, belching, bloating, chest pain, dysphagia with liquids, dysphagia with solids, heartburn, loss of appetite, nausea, vomiting, vomiting blood, weight loss, and  weight gain.        Denies anal fissure, black tarry stools, change in bowel habit, constipation, diarrhea, diverticulosis, fecal incontinence, heme positive stool, hemorrhoids, irritable bowel syndrome, jaundice, light color stool, liver problems, rectal bleeding, and  rectal pain.    Current Medications (verified): 1)  Nexium 40  Mg Cpdr (Esomeprazole Magnesium) .... Take One By Mouth Two Times A Day 2)  Slow-Mag 535 (64 Mg) Mg Cr-Tabs (Magnesium Chloride) .... Take Two Tabs By Mouth Once Daily 3)  Tramadol Hcl 50 Mg Tabs (Tramadol Hcl) .Marland Kitchen.. 1 Q 6 Hrs As Needed Pain 4)  Potassium Chloride 40 Meq/67ml (20%) Liqd (Potassium Chloride) .... Take 40 Meq Daily in [place] Juice 5)  Lisinopril 10 Mg Tabs (Lisinopril) .Marland Kitchen.. 1 By Mouth Qd 6)  Klonopin 0.5 Mg Tabs (Clonazepam) .Marland Kitchen.. 1 By Mouth Q 12 Hours As Needed Anxiety/panic Attack 7)  Humira 40 Mg/0.82ml Kit (Adalimumab) .... Every 2 Weeks 8)  Bentyl 10 Mg Caps (Dicyclomine Hcl) .... Take 1 Tab 3 Times Daily Before Meals As Needed For Cramping 9)  Percocet 5-325 Mg Tabs (Oxycodone-Acetaminophen) .... Take 1 Tab Every 4-6 Hours As Needed For Pain  Allergies (verified): 1)  ! Penicillin  Past History:  Past medical, surgical, family and social histories (including risk factors) reviewed for relevance to current acute and chronic problems.  Past Medical History: Reviewed history from 08/24/2009 and no changes required. B12 DEFICIENCY (ICD-266.2) CROHN'S DISEASE (ICD-555.9)/SMALL BOWEL GERD ANXIETY CHRONIC HEADACHES Hx of HYPOKALEMIA, HX OF (ICD-V12.2)  Past Surgical History: Reviewed history from 05/28/2009 and no changes required. Endometrial Ablation Breast Implants-2004  Family History: Reviewed history from 04/28/2009 and no changes required. Family History of Breast Cancer: Mother Family History of Esophageal Cancer: Maternal Grandfather Family History of Stomach Cancer: Maternal Grandfather Family History of Clotting disorder: Mother No Family History of IBD No FH of Colon Cancer:  Social History: Reviewed history from 03/24/2009 and no changes required. Occupation: Unemployed  Patient currently smokes. -5 cig. daily Alcohol Use - yes-rare Daily Caffeine Use-1 cups daily Illicit Drug Use - no Patient does not get regular exercise.   Review of  Systems       The patient complains of fatigue.  The patient denies allergy/sinus, anemia, anxiety-new, arthritis/joint pain, back pain, blood in urine, breast changes/lumps, change in vision, confusion, cough, coughing up blood, depression-new, fainting, fever, headaches-new, hearing problems, heart murmur, heart rhythm changes, itching, menstrual pain, muscle pains/cramps, night sweats, nosebleeds, pregnancy symptoms, shortness of breath, skin rash, sleeping problems, sore throat, swelling of feet/legs, swollen lymph glands, thirst - excessive , urination - excessive , urination changes/pain, urine leakage, vision changes, and voice change.    Vital Signs:  Patient profile:   30 year old female Height:      69 inches Weight:      157 pounds BMI:     23.27 BSA:     1.87 Pulse rate:   72 / minute Pulse rhythm:   regular BP sitting:   120 / 76  (left arm) Cuff size:   regular  Vitals Entered By: Ok Anis CMA (September 11, 2009 10:40 AM) 11  Physical Exam  General:  Well developed, well nourished, no acute distress.She has gained a large amount of weight and has mild obesity and her facial area. Head:  Normocephalic and atraumatic. Eyes:  PERRLA, no icterus. Lungs:  Clear throughout to auscultation. Heart:  Regular rate and rhythm; no murmurs, rubs,  or bruits. Abdomen:  Soft, nontender and nondistended. No masses, hepatosplenomegaly or hernias noted. Normal bowel sounds. Extremities:  No clubbing, cyanosis, edema or deformities noted. Neurologic:  Alert and  oriented x4;  grossly normal neurologically. Inguinal Nodes:  No significant inguinal adenopathy. Psych:  Alert and cooperative. Normal mood and affect.   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 It is unclear to me why she continues with such pain that she  needs narcotics, she probably will need referral to a pain clinic for control. I'm also concerned about her chronic use of benzodiazepines although she does seem to have legitimate panic attacks. In any case, she seems to be doing well clinically and his not having diarrhea, malabsorption, electrolyte problems. We will repeat laboratory investigation of her chronic fatigue and increase her vitamin B12 injections. She had been advised to continue multivitamin oral supplementation. I discontinued her Bentyl we'll continue other meds. Followup in 6 weeks has been scheduled.  Problem # 2:  ABDOMINAL PAIN-MULTIPLE SITES (ICD-789.09) Assessment: Comment Only Consider CT enteroscopy.  Problem # 3:  B12 DEFICIENCY (ICD-266.2) Assessment: Improved  Repeat labs.  Orders: Vit B12 1000 mcg (J3420)  Problem # 4:  HYPERTENSION (ICD-401.9) Assessment: Improved Blood Pressure and we will continue the senna pill 10 mg a day.  Problem # 5:  ANXIETY (ICD-300.00) Assessment: Improved Consider psychological counseling with Dr. Dellia Cloud.  Other Orders: TLB-B12, Serum-Total ONLY (57846-N62) TLB-Ferritin (82728-FER) TLB-Folic Acid (Folate) (82746-FOL) TLB-IBC Pnl (Iron/FE;Transferrin) (83550-IBC) TLB-TSH (Thyroid Stimulating Hormone) (84443-TSH) TLB-T3, Free (Triiodothyronine) (84481-T3FREE) TLB-T4 (Thyrox), Total [phone]) T-Vitamin D (25-Hydroxy) [phone]) T- * Misc. Laboratory test [phone]) T- * Misc. Laboratory test [phone])  Patient Instructions: 1)  Please schedule a follow-up appointment in 4 to 6 weeks.  2)  Labs Pending 3)  Please continue current medications.Stop dicyclomine and decrease narcotic use to p.r.n.  4)  The medication list was  reviewed and reconciled.  All changed / newly prescribed medications were explained.  A complete medication list was provided to the patient / caregiver.   Medication Administration  Injection # 1:    Medication: Vit B12 1000 mcg    Diagnosis: B12  DEFICIENCY (ICD-266.2)    Route: IM    Site: L deltoid    Exp Date: 112012    Lot #: [id number]    Mfr: American Regent    Comments: Monthly B12 injection     Patient tolerated injection without complications    Given by: Ok Anis CMA (September 11, 2009 11:42 AM)  Orders Added: 1)  TLB-B12, Serum-Total ONLY [16109-U04] 2)  TLB-Ferritin [54098-JXB] 3)  TLB-Folic Acid (Folate) [82746-FOL] 4)  TLB-IBC Pnl (Iron/FE;Transferrin) [83550-IBC] 5)  TLB-TSH (Thyroid Stimulating Hormone) [84443-TSH] 6)  TLB-T3, Free (Triiodothyronine) [14782-N5AOZH] 7)  TLB-T4 (Thyrox), Total [08657-Q4] 8)  T-Vitamin D (25-Hydroxy) [69629-52841] 9)  T- * Misc. Laboratory test [phone] 10)  T- * Misc. Laboratory test [99999] 11)  Vit B12 1000 mcg [J3420]

## 2010-09-07 NOTE — Progress Notes (Signed)
Summary: Refill  Phone Note From Pharmacy   Caller: [facility]  [address] [phone]* Summary of Call: Refill requested on Clonazepam 0.5 mg #60  one 1 12 hrs as needed. Initial call taken by: Alberteen Spindle RN,  November 23, 2009 2:33 PM    Prescriptions: KLONOPIN 0.5 MG TABS (CLONAZEPAM) 1 by mouth q 12 hours as needed anxiety/panic attack  #60 x 0   Entered by:   Alberteen Spindle RN   Authorized by:   Sable Feil MD [facility]   Signed by:   Alberteen Spindle RN on 11/23/2009   Method used:   Printed then faxed to ...       [id number]       [address]       [place], [place]  [address]       Ph: [phone]       Fax: [phone]   RxID:   [id number]

## 2010-09-07 NOTE — Progress Notes (Signed)
Summary: ? about magesium  Phone Note Call from Patient Call back at Home Phone [phone]   Caller: Patient Call For: Sharlett Iles Summary of Call: pt needs to know if she needs to restart magnesium. labs are in echart she had them drawn while inpt. she is getting ready to leave the hospital against medical advice Initial call taken by: Bernita Buffy CMA Deborra Medina),  February 10, 2010 3:10 PM  Follow-up for Phone Call        Pt states she left hospital AMA yesterday.  She had to return to work today or she would be fired from her job.  Her ankle is still red, swollen and painful.  She asks if she needs to be on antiobiotic.  She does not know what she was taking.  Pt states K+ was very low.  She is taking K+ tabs two times a day.  Pt was tokd that her MG+ was normal so she asks if she should cont taking MG+ tabs?  Will get records from hospital when they are available. Follow-up by: Alberteen Spindle RN,  February 11, 2010 8:35 AM  Additional Follow-up for Phone Call Additional follow up Details #1::        1 care.... Additional Follow-up by: Sable Feil MD [facility]  February 11, 2010 10:13 AM    Additional Follow-up for Phone Call Additional follow up Details #2::    Pt informed.  Pt encouraged to keep appt with Dr. Sharlett Iles. Follow-up by: Alberteen Spindle RN,  February 11, 2010 11:47 AM

## 2010-09-07 NOTE — Assessment & Plan Note (Signed)
Summary: Recheck, dfs   History of Present Illness Visit Type: Follow-up Visit Primary GI MD: Sheryn Bison MD FACP FAGA Primary Provider: Newt Lukes MD Requesting Provider: n/a Chief Complaint: Recheck; b12 anemia & Humira History of Present Illness:   30 year old Caucasian female with ileocecal Crohn's disease who has had recent cellulitis of her right leg requiring hospitalization and also probable reaction to doxycycline. She subsequently has had drainage of an abscess in her right anterior shin and is on Septra medication. Time of her hospitalization she had severe hypokalemia and low magnesium levels. She is chronically on Humira injections every 2 weeks but held her injection this past week. Other problems have included migraine headaches, hypertension, chronic pain syndrome, attentional deficit disorder, and endometriosis.  She currently denies GI complaints but is using regular Imodium for diarrhea. She's had no rectal bleeding, upper abdominal pain, or hepatobiliary complaints. She feels that her most recent episode of nausea and vomiting was a reaction to doxycycline. She had CT scan of the abdomen in March which showed ileocolonic Crohn's disease confirmed by previous colonoscopy. Attempts to refer this patient to [facility] for tertiary care consult had been unsuccessful because of her lack of insurance.  Her leg pain is improved but she still has drainage from her wound right leg. She has no other skin lesions or arthritis complaints, fever, chills, or systemic problems at this time. In fact, she asked to begin doing fairly well before her recent fall and leg trauma.   GI Review of Systems      Denies abdominal pain, acid reflux, belching, bloating, chest pain, dysphagia with liquids, dysphagia with solids, heartburn, loss of appetite, nausea, vomiting, vomiting blood, weight loss, and  weight gain.        Denies anal fissure, black tarry stools, change in bowel  habit, constipation, diarrhea, diverticulosis, fecal incontinence, heme positive stool, hemorrhoids, irritable bowel syndrome, jaundice, light color stool, liver problems, rectal bleeding, and  rectal pain.    Current Medications (verified): 1)  Nexium 40 Mg Cpdr (Esomeprazole Magnesium) .... Take One By Mouth Two Times A Day 2)  Lisinopril 10 Mg Tabs (Lisinopril) .Marland Kitchen.. 1 By Mouth Qd 3)  Klonopin 0.5 Mg Tabs (Clonazepam) .Marland Kitchen.. 1 By Mouth Q 12 Hours As Needed Anxiety/panic Attack 4)  Humira 40 Mg/0.4ml Kit (Adalimumab) .... Every 2 Weeks 5)  Thiamine Hcl 100 Mg/ml Soln (Thiamine Hcl) .Marland Kitchen.. 100 Mg Im Daily X 5 Days, Then 100 Mg Monthly 6)  Cyanocobalamin 1000 Mcg/ml Soln (Cyanocobalamin) .... As Directed 7)  Caltrate 600 1500 Mg Tabs (Calcium Carbonate) .... One Tablet By Mouth Two Times A Day 8)  Percocet 5-325 Mg Tabs (Oxycodone-Acetaminophen) .... Take 1 Tab Evey 6-8 Hours As Needed 9)  Lomotil 2.5-0.025 Mg  Tabs (Diphenoxylate-Atropine) .Marland Kitchen.. 1 Q 8 Hrs As Needed Dairrhea 10)  Flexeril 10 Mg Tabs (Cyclobenzaprine Hcl) .... Take 1 Three Times A Day As Needed 11)  Promethazine Hcl 12.5 Mg Tabs (Promethazine Hcl) .... Take 1 Q 4 Hours As Needed 12)  Celebrex 200 Mg Caps (Celecoxib) .Marland Kitchen.. 1po Two Times A Day As Needed 13)  Adderall 10 Mg Tabs (Amphetamine-Dextroamphetamine) .Marland Kitchen.. 1 By Mouth Two Times A Day - To Fill February 15, 2010 14)  Klor-Con M10 10 Meq Cr-Tabs (Potassium Chloride Crys Cr) .... 2 Tabs By Mouth Once Daily 15)  Septra Ds 800-160 Mg Tabs (Sulfamethoxazole-Trimethoprim) .Marland Kitchen.. 1po Two Times A Day 16)  Sumatriptan Succinate 100 Mg Tabs (Sumatriptan Succinate) .Marland Kitchen.. 1 By Mouth Every Other  Day As Needed  Allergies: 1)  ! Penicillin 2)  ! Doxycycline  Past History:  Past medical, surgical, family and social histories (including risk factors) reviewed for relevance to current acute and chronic problems.  Past Medical History: Reviewed history from 12/02/2009 and no changes required. B12  DEFICIENCY  CROHN'S DISEASE (ICD-555.9)/SMALL BOWEL GERD ANXIETY CHRONIC HEADACHES ADD hypertension    MD rooster: gi - [facility], prev [facility]  Past Surgical History: Reviewed history from 12/02/2009 and no changes required. Endometrial Ablation (01/2009) Breast Implants-2004  Family History: Reviewed history from 04/28/2009 and no changes required. Family History of Breast Cancer: Mother Family History of Esophageal Cancer: Maternal Grandfather Family History of Stomach Cancer: Maternal Grandfather Family History of Clotting disorder: Mother No Family History of IBD No FH of Colon Cancer:  Social History: Reviewed history from 12/02/2009 and no changes required. Occupation: Research scientist (physical sciences) at [employer] Patient currently smokes. -5 cig. daily Alcohol Use - yes-rare Daily Caffeine Use-1 cups daily Illicit Drug Use - no Patient does not get regular exercise.  lives with financee and baby son (born 11/2008)  Review of Systems       The patient complains of allergy/sinus, anemia, anxiety-new, arthritis/joint pain, back pain, headaches-new, muscle pains/cramps, night sweats, sleeping problems, and swelling of feet/legs.  The patient denies blood in urine, breast changes/lumps, change in vision, confusion, cough, coughing up blood, depression-new, fainting, fatigue, fever, hearing problems, heart murmur, heart rhythm changes, itching, menstrual pain, nosebleeds, pregnancy symptoms, shortness of breath, skin rash, sore throat, swollen lymph glands, thirst - excessive , urination - excessive , urination changes/pain, urine leakage, vision changes, and voice change.    Vital Signs:  Patient profile:   30 year old female Height:      69 inches Weight:      147.38 pounds BMI:     21.84 Pulse rate:   104 / minute Pulse rhythm:   regular BP sitting:   128 / 90  (left arm) Cuff size:   regular  Vitals Entered By: June McMurray CMA  [name]) (February 16, 2010 3:52 PM)  Physical Exam  General:  Well developed, well nourished, no acute distress.healthy appearing.   Head:  Normocephalic and atraumatic. Eyes:  PERRLA, no icterus.exam deferred to patient's ophthalmologist.   Lungs:  Clear throughout to auscultation. Heart:  Regular rate and rhythm; no murmurs, rubs,  or bruits. Abdomen:  Soft, nontender and nondistended. No masses, hepatosplenomegaly or hernias noted. Normal bowel sounds. Msk:  Her Right anterior lower leg area has some erythema, edema, and a rather deep incision with some purulence. I cannot appreciate a definite thrombophlebitis, no evidence of arthritis in other joints. Pulses:  Normal pulses noted. Neurologic:  Alert and  oriented x4;  grossly normal neurologically. Psych:  Alert and cooperative. Normal mood and affect.   Impression & Recommendations:  Problem # 1:  CELLULITIS AND ABSCESS OF LEG EXCEPT FOOT (ICD-682.6) Assessment Improved I have referred her to [facility] [place] [facility] for further evaluation and treatment. I'm concerned that she may have an opportunistic infection in her right leg related to her chronic Humira injections. For now she is to continue her Septra and warm compresses locally.Review of hospital discharge as the patient was treated with a brief course of Avelox and clindamycin for 2 days from July 4 and 5 before she left AMA.  Problem # 2:  CROHN'S DISEASE-SMALL INTESTINE (ICD-555.0) Assessment: Improved Hold Humira injections for  another 2 weeks pending resolution hopefully of her cellulitis.Actually her Crohn disease is been doing extremely well my clinical standpoint. Her problems with continued electrolyte abnormalities is somewhat disturbing however.I have increased her potassium to 40 mg a day and restarted Slow-Mag capsules twice a day. Unfortunately, her [employer] plan apparently will not pay any amount for care concerning her previously  diagnosed Crohn's disease. This makes management of her care very difficult. I will see her back in several weeks' time and re\re advise her as to whether resume her Humira. We'll repeat her electrolytes and magnesium level before Visit.  Problem # 3:  VITAMIN B1 DEFICIENCY (ICD-265.1) Assessment: Improved Continue B12 replacement and other vitamins which have been found to be deficient on previous exams.  Problem # 4:  SMOKER (ICD-305.1) Assessment: Unchanged  Problem # 5:  ADHD (ICD-314.01) Assessment: Comment Only apparently she is on Ritalin therapy.  Problem # 6:  BACK PAIN, CHRONIC (ICD-724.5) Assessment: Improved p.r.n. analgesia as needed.Reviewing her record, I do not think she is abusing narcotics or other analgesics.  Patient Instructions: 1)  Increase Potassium to 4 tabs daily. 2)  Increase Magnesium to 2 tabs daily. 3)  You are scheduled for an [facility] on July 20 at 2:00. 4)  Please schedule a follow-up appointment in 3 weeks.  5)  The medication list was reviewed and reconciled.  All changed / newly prescribed medications were explained.  A complete medication list was provided to the patient / caregiver. Prescriptions: KLOR-CON M10 10 MEQ CR-TABS (POTASSIUM CHLORIDE CRYS CR) 2 tabs by mouth bid  #120 x 6   Entered by:   Ashok Cordia RN   Authorized by:   Mardella Layman MD [facility]   Signed by:   Ashok Cordia RN on 02/16/2010   Method used:   [facility]  [address] [phone]* (retail)       [address]       [place], [place]  [address]       Ph: [phone]       Fax: [phone]   RxID:   [id number] SLOW-MAG 71.5-119 MG TBEC (MAGNESIUM CL-CALCIUM CARBONATE) 2 tabs daily  #60 x 6   Entered by:   Ashok Cordia RN   Authorized by:   Mardella Layman MD [facility]   Signed by:   Ashok Cordia RN on 02/16/2010   Method used:   [facility]        [facility]  [address] [phone]* (retail)       [address]       [place], [place]  [address]       Ph: [phone]       Fax: [phone]   RxID:   [id number] KLOR-CON M10 10 MEQ CR-TABS (POTASSIUM CHLORIDE CRYS CR) 2 tabs by mouth bid  #120 x 6   Entered by:   Ashok Cordia RN   Authorized by:   Mardella Layman MD [facility]   Signed by:   Ashok Cordia RN on 02/16/2010   Method used:   [facility]        [facility]  [address] [id number]       [address] [facility]       [place], [place]  [id number]       Ph: [phone]       Fax: [phone]   RxID:   [id number]   Appended Document: Recheck, dfs    Clinical Lists Changes  Orders: Added new Referral order of Wound Care Center Referral (Wound Care) - Signed

## 2010-09-07 NOTE — Progress Notes (Signed)
Summary: Triage  Phone Note Call from Patient Call back at Home Phone [phone]   Caller: Patient Call For: Dr. Sharlett Iles Reason for Call: Talk to Nurse Summary of Call: wants to know if she can take her Humira this week Initial call taken by: Webb Laws,  February 25, 2010 11:11 AM  Follow-up for Phone Call        depends on leg wound ststus Follow-up by: Sable Feil MD Marval Regal,  February 25, 2010 11:29 AM     Appended Document: Triage Talked with pt.  States wound looks about the same.  Pt will hold humira and reoprt back in one week.  Pt has appt at wound clinic 03/08/10.

## 2010-09-07 NOTE — Progress Notes (Signed)
Summary: Prednisone  Phone Note Outgoing Call   Summary of Call: Pt has taken second dose of humira.  Doing well. She has been on 15 mg of prednisone for 2 weeks now.  Asking if she can decrease this again.  has OV in 2 weeks with Dr. Jarold Motto. Initial call taken by: Ashok Cordia RN,  July 03, 2009 11:35 AM    Additional Follow-up for Phone Call Additional follow up Details #2::    TAPER  5MG /DAY EVERY 2 WEEKS... Follow-up by: Mardella Layman MD FACG,  July 06, 2009 9:25 AM  Additional Follow-up for Phone Call Additional follow up Details #3:: Details for Additional Follow-up Action Taken: Pt notified.   Additional Follow-up by: Ashok Cordia RN,  July 06, 2009 9:28 AM

## 2010-09-07 NOTE — Progress Notes (Signed)
Summary: ? re meds  Phone Note Call from Patient Call back at Home Phone [phone]   Caller: Patient Call For: Dr Bertram Gala Reason for Call: Talk to Nurse Summary of Call: Patient has questions regarding meds. Initial call taken by: Tawni Levy,  April 08, 2010 3:25 PM  Follow-up for Phone Call        Pt stumped her little toe on her right foot. Skin was torn.  Now toe is red swollen and draining "pus".  Pt is due to take Humira tomorrow but she is not sure she should take it.  Also asks if we can call in Rx for an antioboitic. Follow-up by: Ashok Cordia RN,  April 08, 2010 4:17 PM  Additional Follow-up for Phone Call Additional follow up Details #1::        Tell her we need primary care helping with her care.Marland KitchenMarland KitchenI cannot do it ALL.hold humira and see i care for ongoing leg infections... Additional Follow-up by: Mardella Layman MD [facility],  April 09, 2010 9:54 AM    Additional Follow-up for Phone Call Additional follow up Details #2::    LM for pt to call.  Ashok Cordia RN  April 09, 2010 12:35 PM  Pt notified.   Follow-up by: Ashok Cordia RN,  April 09, 2010 3:51 PM

## 2010-09-07 NOTE — Progress Notes (Signed)
Summary: Triage  Phone Note Call from Patient   Caller: Patient Call For: Dr. Jarold Motto Reason for Call: Talk to Nurse Summary of Call: Pt. had an appt. scheduled for this morning and has a flare up and had to reschedule until 09-03-09. Pt. wants you to return her call.  Initial call taken by: Karna Christmas,  August 20, 2009 9:26 AM  Follow-up for Phone Call        Pt states she started yesterday with lower abd pain and cramping. States feels like crohns pain.  Last dose of Humira was 08/14/09.  Pt called for refill of Darvocet and was told it is no longer available.  Req replacement.  Pt rescheduled OV for 09/03/09.   Will send Rx for Ultram per Dr. Jarold Motto.  Pt will needsOV to be evaluated. Follow-up by: Ashok Cordia RN,  August 20, 2009 12:52 PM  Additional Follow-up for Phone Call Additional follow up Details #1::        OV sch with Amy Esterwood.  Pt req appt next week. Additional Follow-up by: Ashok Cordia RN,  August 20, 2009 1:54 PM    New/Updated Medications: TRAMADOL HCL 50 MG TABS (TRAMADOL HCL) 1 q 6 hrs as needed pain Prescriptions: TRAMADOL HCL 50 MG TABS (TRAMADOL HCL) 1 q 6 hrs as needed pain  #40 x 2   Entered by:   Ashok Cordia RN   Authorized by:   Mardella Layman MD [facility]   Signed by:   Ashok Cordia RN on 08/20/2009   Method used:   [facility]  [address] [phone][facility] (retail[facility]       [address]       [place], [place]  [address]       Ph: [phone]       Fax: [phone]   RxID:   [id number]

## 2010-09-07 NOTE — Progress Notes (Signed)
Summary: Adderall/VAL pt  Phone Note Call from Patient Call back at Work Phone [phone]   Caller: Patient Summary of Call: pt called requesting refill of Adderall Initial call taken by: Crissie Sickles, [place],  January 07, 2010 9:02 AM  Follow-up for Phone Call        Pt informed, Rx in cabinet for pt pick up Follow-up by: Crissie Sickles, CMA,  January 07, 2010 1:16 PM    New/Updated Medications: ADDERALL 10 MG TABS (AMPHETAMINE-DEXTROAMPHETAMINE) 1 by mouth two times a day - to fill January 07, 2010 Prescriptions: ADDERALL 10 MG TABS (AMPHETAMINE-DEXTROAMPHETAMINE) 1 by mouth two times a day - to fill January 07, 2010  #60 x 0   Entered and Authorized by:   Biagio Borg MD   Signed by:   Biagio Borg MD on 01/07/2010   Method used:   Print then Give to Patient   RxID:   [id number]  done hardcopy to LIM side B - dahlia Biagio Borg MD  January 07, 2010 1:07 PM

## 2010-09-07 NOTE — Progress Notes (Signed)
Summary: Medication  Phone Note Call from Patient Call back at Home Phone [phone]   Caller: Patient Call For: Dr. Sharlett Iles Reason for Call: Talk to Nurse Summary of Call: Asking to speak directly to [place]- Her therapist wants her to switch from her Klonopin and would like to discuss Initial call taken by: Webb Laws,  April 22, 2010 2:42 PM  Follow-up for Phone Call        Dr. Tanda Rockers phone number is [phone] ext [phone] her therapist, would like to know since Dr. Sharlett Iles rxs her Klonopin will he change her to something eles. The patient and Dr. Jerline Pain do not think that the Klonopin is working she is having alot of anxiety still. Dr. Jerline Pain can not rx drugs.  Follow-up by: Bernita Buffy CMA Deborra Medina),  April 22, 2010 2:49 PM  Additional Follow-up for Phone Call Additional follow up Details #1::        chage to effexor xr 75 mg day and taper off klonopin uver 2 weeks... Additional Follow-up by: Sable Feil MD Marval Regal,  April 23, 2010 8:26 AM     Appended Document: Medication Pt aware to taper off Klonopin, take one a day for one week then one every other day the second week. I have sent Effexor.    Clinical Lists Changes  Medications: Changed medication from KLONOPIN 0.5 MG TABS (CLONAZEPAM) 1 by mouth q 12 hours as needed anxiety/panic attack to KLONOPIN 0.5 MG TABS (CLONAZEPAM) 1 by mouth q 12 hours as needed anxiety/panic attack (Taper off) Added new medication of EFFEXOR XR 75 MG XR24H-CAP (VENLAFAXINE HCL) take one by mouth once daily - Signed Rx of EFFEXOR XR 75 MG XR24H-CAP (VENLAFAXINE HCL) take one by mouth once daily;  #30 x 3;  Signed;  Entered by: Bernita Buffy CMA (AAMA);  Authorized by: Sable Feil MD [facility];  Method used: [facility]  [address] [id number]*, [address], [place], [place], [place]  [address], Ph: [phone], Fax: [phone]    Prescriptions: EFFEXOR XR 75 MG XR24H-CAP (VENLAFAXINE HCL) take one by  mouth once daily  #30 x 3   Entered by:   Bernita Buffy CMA ([place])   Authorized by:   Sable Feil MD [facility]   Signed by:   Bernita Buffy CMA ([place]) on 04/23/2010   Method used:   [facility]        [facility] [place] [phone][facility] (retail)       [address]       [place], [place]  [address]       Ph: [phone]       Fax: [phone]   RxID:   [id number]

## 2010-09-07 NOTE — Progress Notes (Signed)
Summary: needs rx  Phone Note Call from Patient Call back at Home Phone [phone]   Caller: Patient Call For: Bennie Chirico Reason for Call: Talk to Nurse Summary of Call: Patient wants to let Pam know that she needs more antibiotic Initial call taken by: Tawni Levy,  November 13, 2009 10:24 AM  Follow-up for Phone Call        I spoke to the pt and tried to get her in with Dr. Jarold Motto tomorrow but she can't miss work.  I also tried to get her in on 4-7.  She is still having very  loose stools 4-5 daily.  She is taking the Lomotil daily. She is done with the Flagyl that Kaho Selle PA-c had prescribed.  Some umbilical area and below the navel discomfort. The pt also has a cold at this time, upper respiratory congestion.  Follow-up by: Joselyn Glassman,  November 16, 2009 9:28 AM  Additional Follow-up for Phone Call Additional follow up Details #1::        Pt c/o head and chest congestion.  Cough.  On Humira.  Asks if she needs antiobiotic? Additional Follow-up by: Ashok Cordia RN,  November 16, 2009 3:02 PM    Additional Follow-up for Phone Call Additional follow up Details #2::    NOT INDICATED Follow-up by: Mardella Layman MD [facility],  November 16, 2009 3:11 PM   Appended Document: needs rx Talked with pt.  States she is feeling some better.  Advised pt to report back if fever develops or symptoms worsen.

## 2010-09-07 NOTE — Progress Notes (Signed)
  Phone Note From Pharmacy   [facility] [id number] Summary of Call: Refill requested on Clonazepam. Initial call taken by: Alberteen Spindle RN,  March 25, 2010 1:11 PM  Follow-up for Phone Call        Refill faxed. Follow-up by: Alberteen Spindle RN,  March 25, 2010 1:12 PM    Prescriptions: KLONOPIN 0.5 MG TABS (CLONAZEPAM) 1 by mouth q 12 hours as needed anxiety/panic attack  #60 x 0   Entered by:   Alberteen Spindle RN   Authorized by:   Sable Feil MD [facility]   Signed by:   Alberteen Spindle RN on 03/25/2010   Method used:   Printed then faxed to ...       [facility] [place] [id number] (retail)       [address]       [place], [place]  [address]       Ph: [phone]       Fax: [phone]   RxID:   [id number]

## 2010-09-07 NOTE — Progress Notes (Signed)
Summary: Refill  Phone Note From Pharmacy   [facility] [place] [id number] Summary of Call: Refill requested on Promethazine Initial call taken by: Alberteen Spindle RN,  Dec 29, 2009 3:40 PM    Prescriptions: PROMETHAZINE HCL 12.5 MG TABS (PROMETHAZINE HCL) take 1 q 4 hours as needed  #30 x 0   Entered by:   Alberteen Spindle RN   Authorized by:   Sable Feil MD [facility]   Signed by:   Alberteen Spindle RN on 12/29/2009   Method used:   [facility] to        [facility] [place] [phone][facility] (retail)       [address]       [place], [place]  [address]       Ph: [phone]       Fax: [phone]   RxID:   [id number]

## 2010-09-07 NOTE — Progress Notes (Signed)
Summary: R/S appt. and discuss Humira  Phone Note Call from Patient Call back at [facility] Phone [phone]   Caller: Patient Call For: Dr. Jarold Motto Summary of Call: pt. had to r/s her appt. for today until 07-20-10 and has stopped taking her Humira...would like to discuss Initial call taken by: Karna Christmas,  June 29, 2010 11:41 AM  Follow-up for Phone Call        ok..Dr Jarold Motto this is the second same day resch she also resch her last office visit last week. She stopped her Humira due to getting continued cold when she is taking. She is having some abdominal pain and diarrhea the first appt that she thinks she can come to is 07/20/2010 Follow-up by: Harlow Mares CMA [name]),  June 29, 2010 12:04 PM  Additional Follow-up for Phone Call Additional follow up Details #1::        ok Additional Follow-up by: Mardella Layman MD FACG,  June 29, 2010 12:21 PM    Additional Follow-up for Phone Call Additional follow up Details #2::    pt aware keep her appt on 07/20/2010 Follow-up by: Harlow Mares CMA (AAMA),  June 29, 2010 3:15 PM

## 2010-09-07 NOTE — Progress Notes (Signed)
Summary: Humira ?'s  Phone Note Call from Patient Call back at Home Phone [phone]   Caller: Patient Call For: Dr. Jarold Motto Reason for Call: Talk to Nurse Summary of Call: would like to confirm Humira instructions Initial call taken by: Vallarie Mare,  June 04, 2010 12:54 PM  Follow-up for Phone Call        Patient called and wants to know if it is ok to resume her Humira.  She no longer is having any problems with her feet.  She is having some abdominal pain and wanted to know if she can start it back.  She does have a follow up appointment with Dr Jarold Motto on 06/22/10.  Dr Jarold Motto please advise if ok to resume Humira and at what dose. Follow-up by: Darcey Nora RN, CGRN,  June 04, 2010 1:19 PM  Additional Follow-up for Phone Call Additional follow up Details #1::        yes Additional Follow-up by: Mardella Layman MD Clementeen Graham,  June 04, 2010 1:35 PM    Additional Follow-up for Phone Call Additional follow up Details #2::    Patient advised to resume Humira at 40 mg q 2 weeks.  She is reminded to keep her appointment with Dr Jarold Motto for 06/22/10 Follow-up by: Darcey Nora RN, CGRN,  June 04, 2010 1:41 PM  New/Updated Medications: HUMIRA 40 MG/0.8ML KIT (ADALIMUMAB) SQ EVERY 2 WEEKS

## 2010-09-07 NOTE — Progress Notes (Signed)
Summary: Needs clarifications  Phone Note From Pharmacy   Caller: [phone] option 1 Patient assistance program Call For: DR Jarold Motto  Summary of Call: Needs some clarifications on order. Initial call taken by: Leanor Kail [facility],  June 10, 2009 12:41 PM  Follow-up for Phone Call        Called [employer] patient assistance, talked with Jesusita Oka.   Follow-up by: Ashok Cordia RN,  June 11, 2009 10:38 AM

## 2010-09-07 NOTE — Progress Notes (Signed)
Summary: sooner appt demand  Phone Note Call from Patient Call back at Home Phone [phone]   Caller: Patient Call For: Dr. Jarold Motto Reason for Call: Talk to Nurse Summary of Call: pt wants to move her appt with Dr. Jarold Motto up... told pt nothing is available, pt asked for [facility] or Amy... told pt these are reserved for emergency cases... pt said she wanted to speak to Lupita Leash b/c she would want pt to come in sooner Initial call taken by: Vallarie Mare,  July 24, 2009 10:00 AM  Follow-up for Phone Call        Left message for pt to call.   Lupita Leash Surface RN  July 24, 2009 2:59 PM  Left message for pt to call.  Ashok Cordia RN  July 27, 2009 9:49 AM  Additional Follow-up for Phone Call Additional follow up Details #1::        talked with pt.  SHe wants to resch appt for B12 inj and for OV.  Both appts rescheduled per pt's request. Additional Follow-up by: Ashok Cordia RN,  July 27, 2009 4:46 PM

## 2010-09-07 NOTE — Progress Notes (Signed)
Summary: bloated abd  Phone Note Call from Patient Call back at Home Phone [phone]   Caller: Patient Call For: Dr. Jarold Motto Reason for Call: Talk to Nurse Summary of Call: pt says she is experiencing a severely bloated abd for past month and wants to know what she can do Initial call taken by: Vallarie Mare,  October 28, 2009 8:03 AM  Follow-up for Phone Call        Pt states she is having alot of pain and bloating after eating.  Vomits at times.  States she is miserable some days.  Diarrhea is better.  Dr. Jarold Motto had wanted pt to go th [facility] for 2nd opinion but pt has just started a new job and had hoped to wait a while before taking off. Pt asks if  there is some type of medication that would help symptoms?  is OV needed? Follow-up by: Ashok Cordia RN,  October 28, 2009 12:59 PM  Additional Follow-up for Phone Call Additional follow up Details #1::        see amy please Additional Follow-up by: Mardella Layman MD FACG,  October 28, 2009 2:11 PM    Additional Follow-up for Phone Call Additional follow up Details #2::    Pt will see Gasper Sells PA tomorrow pm.  Follow-up by: Ashok Cordia RN,  October 28, 2009 2:29 PM

## 2010-09-07 NOTE — Progress Notes (Signed)
Summary: BP med  Phone Note Outgoing Call   Summary of Call: Pt here today for Humira instruction.  Pt started on BP med at last OV.  BP today 150/108.  Pt states she has not missed any of the BP pills.  Pt has BP cuff at home but she has not taken BP since starting on med.  Pt instructed to decrease prednisone to 15 mg once daily.  Retrun appt sch with Dr. Jarold Motto on 07/16/09.  Labs are drawn today.  Any change needed in BP med?   Initial call taken by: Ashok Cordia RN,  June 18, 2009 11:47 AM  Follow-up for Phone Call        YES,INCREASE LISINOPRIL TO 10MG  ONCE DAILY,ALSO MAKE SURE PT AWARE LISINOPRIL CONTRA INDICATED IN PREGNANCY.SHE SHOULD BE MAKING SERIOUS EFFORT NOT TO GET PREGNANT WITH THIS AND HER CROHNS BEING POORLY CONTROLLED Follow-up by: Peterson Ao,  June 18, 2009 3:19 PM     Appended Document: BP med Pt notified.   Clinical Lists Changes  Medications: Changed medication from LISINOPRIL 5 MG TABS (LISINOPRIL) 1 by mouth qd to LISINOPRIL 10 MG TABS (LISINOPRIL) 1 by mouth qd - Signed Rx of LISINOPRIL 10 MG TABS (LISINOPRIL) 1 by mouth qd;  #30 x 3;  Signed;  Entered by: Ashok Cordia RN;  Authorized by: Mardella Layman MD [facility];  Method used: [facility] [address] [phone]*, [address] [place], [place], [place]  [address], Ph: [phone], Fax: [phone]    Prescriptions: LISINOPRIL 10 MG TABS (LISINOPRIL) 1 by mouth qd  #30 x 3   Entered by:   Ashok Cordia RN   Authorized by:   Mardella Layman MD [facility]   Signed by:   Ashok Cordia RN on 06/18/2009   Method used:   Electronically to        [facility]  [address] [phone]* (retail)       [address]       [place], [place]  [address]       Ph: [phone]       Fax: [phone]   RxID:   [id number]    Appended Document: BP med Pt notified.  Discussed  birth control with pt.  She is currently taking depo-provera injections.

## 2010-09-07 NOTE — Progress Notes (Signed)
Summary: Thinks she had a mini stroke  Phone Note Call from Patient Call back at Home Phone [phone]   Call For: DR Jarold Motto Reason for Call: Talk to Nurse Summary of Call: Issues with her crohns and believes maybe she had a mini stroke Initial call taken by: Leanor Kail [facility],  Dec 17, 2009 8:52 AM  Follow-up for Phone Call        Pt states she has not been able to eat.  Vomiting. Dairrhea.   Under alot of stress.  Pt has appt at [facility] in June,  Found out that she may qualify for medicaid and if so she would like to move appt up at [facility] if possible.  On Humira, nexium, lomotil.  Any other suggestions?   Pt also states thas she has a spell last pm where she could not speak for a few minutes and her hands were drawing.  Insstructed pt to call PCP re these symptoms.  Follow-up by: Ashok Cordia RN,  Dec 17, 2009 9:21 AM  Additional Follow-up for Phone Call Additional follow up Details #1::        i agree..Need chart ASAP/STAT to move up her appt.if possible... Additional Follow-up by: Mardella Layman MD FACG,  Dec 18, 2009 8:31 AM    Additional Follow-up for Phone Call Additional follow up Details #2::    Talked with pt.  She was seen in ER as suggested by PCP yesterday.  Exam negative.  She has been referred to a neurologist.  Pt found out that she does not have any medicaid coverage.  Will not have insurance until June.  wants to try to wait until then of appt at [facility].  Pt will call back and report symptoms next week. Follow-up by: Ashok Cordia RN,  Dec 18, 2009 12:10 PM

## 2010-09-07 NOTE — Progress Notes (Signed)
Summary: Med. refill  Phone Note Call from Patient Call back at Home Phone [phone]   Caller: Patient Call For: Dr. Sharlett Iles Reason for Call: Talk to Nurse Summary of Call: Refill on phenergan....she lost the last prescription Initial call taken by: Webb Laws,  January 19, 2010 10:00 AM  Follow-up for Phone Call        Rx sent.  Follow up appt made.   Pt notified. Follow-up by: Alberteen Spindle RN,  January 19, 2010 10:41 AM    Prescriptions: PROMETHAZINE HCL 12.5 MG TABS (PROMETHAZINE HCL) take 1 q 4 hours as needed  #30 x 1   Entered by:   Alberteen Spindle RN   Authorized by:   Sable Feil MD [facility]   Signed by:   Alberteen Spindle RN on 01/19/2010   Method used:   [facility]ronically to        [facility]  [address] [place] [phone]* (retail)       [address]       [place], [place]  [address]       Ph: [phone]       Fax: [phone]   RxID:   [id number]

## 2010-09-07 NOTE — Assessment & Plan Note (Signed)
Summary: Monthly B12/dfs  Nurse Visit   Allergies: 1)  ! Penicillin  Medication Administration  Injection # 1:    Medication: Vit B12 1000 mcg    Diagnosis: B12 DEFICIENCY (ICD-266.2)    Route: IM    Site: R deltoid    Exp Date: 05/09/2011    Lot #: [id number]    Mfr: [employer] Regent    Patient tolerated injection without complications    Given by: Harlow Mares CMA (AAMA) (July 28, 2009 1:39 PM)

## 2010-09-07 NOTE — Assessment & Plan Note (Signed)
Summary: weekley b12/...em  Nurse Visit   Allergies: 1)  ! Penicillin  Medication Administration  Injection # 1:    Medication: Vit B12 1000 mcg    Diagnosis: B12 DEFICIENCY (ICD-266.2)    Route: IM    Site: R deltoid    Exp Date: 01/2011    Lot #: [id number]    Mfr: [employer] Regent    Comments: pt wants Nascobal sent to pharmacy.  Gave pt information on the Nascobal    Patient tolerated injection without complications    Given by: Merri Ray CMA (AAMA) (May 13, 2009 1:54 PM)  Orders Added: 1)  Vit B12 1000 mcg [J3420]

## 2010-09-07 NOTE — Assessment & Plan Note (Signed)
Summary: B12-B1 per donna--ch.  Nurse Visit   Allergies: 1)  ! Penicillin  Medication Administration  Injection # 1:    Medication: Vit B12 1000 mcg    Diagnosis: VITAMIN B1 DEFICIENCY (ICD-265.1)    Route: IM    Site: L deltoid    Exp Date: 11/2011    Lot #: [id number]    Mfr: APP Pharm    Patient tolerated injection without complications    Given by: Harlow Mares CMA (AAMA) (January 08, 2010 4:10 PM)  Injection # 2:    Medication: Thiamine    Diagnosis: VITAMIN B1 DEFICIENCY (ICD-265.1)    Route: IM    Site: L thigh    Exp Date: 06/2011    Lot #: [id number]    Mfr: APP Pharm    Comments: Gave 100MG     Patient tolerated injection without complications    Given by: Harlow Mares CMA (AAMA) (January 08, 2010 4:11 PM)  Orders Added: 1)  Vit B12 1000 mcg [J3420]

## 2010-09-07 NOTE — Progress Notes (Signed)
Summary: Adderall  Phone Note Call from Patient Call back at Home Phone [phone]   Caller: Patient Summary of Call: pt left vm stating all pharmacies are out of Adderall.  She is requesting Rx for Adderall XR because she is out. Initial call taken by: Jonathon Resides, [facility]),  May 28, 2010 3:33 PM  Follow-up for Phone Call        done hardcopy to LIM side B - dahlia  Follow-up by: Biagio Borg MD,  May 28, 2010 4:21 PM  Additional Follow-up for Phone Call Additional follow up Details #1::        rx upfront in cabinet for pt to pickup Additional Follow-up by: Rebeca Alert MA,  May 28, 2010 4:31 PM    New/Updated Medications: ADDERALL XR 20 MG XR24H-CAP (AMPHETAMINE-DEXTROAMPHETAMINE) 1po once daily Prescriptions: ADDERALL XR 20 MG XR24H-CAP (AMPHETAMINE-DEXTROAMPHETAMINE) 1po once daily  #30 x 0   Entered and Authorized by:   Biagio Borg MD   Signed by:   Biagio Borg MD on 05/28/2010   Method used:   Print then Give to Patient   RxID:   [id number]

## 2010-09-07 NOTE — Progress Notes (Signed)
Summary: Medication  Phone Note Call from Patient   Caller: Patient Call For: Dr. Sharlett Iles Reason for Call: Talk to Nurse Summary of Call: Needs a script for slow mag mailed to her Initial call taken by: Webb Laws,  May 24, 2010 8:23 AM  Follow-up for Phone Call        patient scheduled for office visit on 06/22/2010, refill given until then.  Follow-up by: Bernita Buffy CMA Deborra Medina),  May 24, 2010 8:32 AM    Prescriptions: SLOW-MAG 71.5-119 MG TBEC (MAGNESIUM CL-CALCIUM CARBONATE) 2 tabs daily  #60 x 3   Entered by:   Bernita Buffy CMA ([place])   Authorized by:   Sable Feil MD [facility]   Signed by:   Bernita Buffy CMA ([place]) on 05/24/2010   Method used:   Print then Mail to Patient   RxID:   [id number]

## 2010-09-07 NOTE — Progress Notes (Signed)
Summary: Refill  Phone Note From Pharmacy   [facility]  [address] [id number] Summary of Call: Refill requested on Clonazepam 0.5 mg.  Last filled 11-23-09. Initial call taken by: Ashok Cordia RN,  Dec 23, 2009 11:43 AM  Follow-up for Phone Call        Rx faxed to pharmacy. Follow-up by: Ashok Cordia RN,  Dec 23, 2009 11:46 AM    Prescriptions: KLONOPIN 0.5 MG TABS (CLONAZEPAM) 1 by mouth q 12 hours as needed anxiety/panic attack  #60 x 0   Entered by:   Ashok Cordia RN   Authorized by:   Mardella Layman MD [facility]   Signed by:   Ashok Cordia RN on 12/23/2009   Method used:   Printed then faxed to ...       [facility]  [address] [id number] (retail)       [address]       [place], [place]  [address]       Ph: [phone]       Fax: [phone]   RxID:   [id number]

## 2010-09-07 NOTE — Progress Notes (Signed)
Summary: pt requesting refill  Phone Note Call from Patient Call back at Home Phone [phone]   Caller: Patient Summary of Call: pt requests refill for adderall Initial call taken by: Crissie Sickles, [place],  April 27, 2010 10:20 AM  Follow-up for Phone Call        spoke to pt.  she wiill come by the office and pick up prescription/ or her mother will pick up with proper id.  RX in cabinet up front. Follow-up by: Crissie Sickles, [place],  April 27, 2010 11:23 AM    New/Updated Medications: ADDERALL 10 MG TABS (AMPHETAMINE-DEXTROAMPHETAMINE) 1 by mouth two times a day - to fill sept 20, 2011 Prescriptions: ADDERALL 10 MG TABS (AMPHETAMINE-DEXTROAMPHETAMINE) 1 by mouth two times a day - to fill sept 20, 2011  #60 x 0   Entered and Authorized by:   Biagio Borg MD   Signed by:   Biagio Borg MD on 04/27/2010   Method used:   Print then Give to Patient   RxID:   [id number]  done hardcopy to LIM side B - dahlia Biagio Borg MD  April 27, 2010 11:17 AM

## 2010-09-07 NOTE — Progress Notes (Signed)
Summary: rx refill  Phone Note Call from Patient Call back at Home Phone [phone]   Caller: Patient Call For: Dr. Jarold Motto Reason for Call: Talk to Nurse Summary of Call: would like a rx for Percocett... would like it mailed to her Initial call taken by: Vallarie Mare,  March 30, 2010 4:51 PM  Follow-up for Phone Call        OK to refill?  She has not had rx since February 05, 2010. Follow-up by: Ashok Cordia RN,  March 31, 2010 9:55 AM  Additional Follow-up for Phone Call Additional follow up Details #1::        tok Additional Follow-up by: Mardella Layman MD Clementeen Graham,  March 31, 2010 10:21 AM    Additional Follow-up for Phone Call Additional follow up Details #2::    Rx printed.  Mailed to pt.  Pt notified. Follow-up by: Ashok Cordia RN,  March 31, 2010 11:40 AM  Prescriptions: PERCOCET 5-325 MG TABS (OXYCODONE-ACETAMINOPHEN) Take 1 tab evey 6-8 hours as needed  #30 x 0   Entered by:   Ashok Cordia RN   Authorized by:   Mardella Layman MD [facility]   Signed by:   Ashok Cordia RN on 03/31/2010   Method used:   Print then Mail to Patient   RxID:   [id number]

## 2010-09-07 NOTE — Progress Notes (Signed)
Summary: refill  Phone Note Refill Request Message from:  Patient  Refills Requested: Medication #1:  PERCOCET 5-325 MG TABS Take 1 tab two times a day as needed   Dosage confirmed as above?Dosage Confirmed pt would like to pick up refill when she comes on 11/15 to visit GI. please advise.  Initial call taken by: Alysia Penna,  June 18, 2010 11:35 AM  Follow-up for Phone Call        done hardcopy to LIM side B - dahlia  Follow-up by: Corwin Levins MD,  June 18, 2010 11:48 AM  Additional Follow-up for Phone Call Additional follow up Details #1::        Pt informed, Rx in cabinet for pt pick up Additional Follow-up by: Alysia Penna,  June 18, 2010 12:00 PM    New/Updated Medications: PERCOCET 5-325 MG TABS (OXYCODONE-ACETAMINOPHEN) Take 1 tab two times a day as needed - to fill Jun 22, 2010 Prescriptions: PERCOCET 5-325 MG TABS (OXYCODONE-ACETAMINOPHEN) Take 1 tab two times a day as needed - to fill Jun 22, 2010  #60 x 0   Entered and Authorized by:   Corwin Levins MD   Signed by:   Corwin Levins MD on 06/18/2010   Method used:   Print then Give to Patient   RxID:   [id number]

## 2010-09-07 NOTE — Progress Notes (Signed)
Summary: Humira Injection  Phone Note Call from Patient Call back at Home Phone [phone]   Caller: Patient Reason for Call: Talk to Nurse Summary of Call: Pt is in hospital ?MRSA/Staph or allergic reaction to antibiotics. Pt wants to know if she should have Humira injection this Friday. Initial call taken by: Abelino Derrick CMA Deborra Medina),  February 10, 2010 9:40 AM  Follow-up for Phone Call        Pt twisted ankle last week and it swelled.  Went to PCP and was admitted to hosp with possible cellulitis.  Pt is to be discharged today.  Pt states Dr. Judi Cong instructed her to hold her humira this week.  Wanted to make sure this was OK to do.  Pt has OV with Dr. Sharlett Iles on 02/16/10. Follow-up by: Alberteen Spindle RN,  February 10, 2010 10:06 AM  Additional Follow-up for Phone Call Additional follow up Details #1::        HOLD TILL SHE SEES ME OR GI AT [facility]... Additional Follow-up by: Sable Feil MD Marval Regal,  February 10, 2010 10:19 AM    Additional Follow-up for Phone Call Additional follow up Details #2::    Pt notified. Follow-up by: Alberteen Spindle RN,  February 10, 2010 10:42 AM

## 2010-09-07 NOTE — Assessment & Plan Note (Signed)
Summary: See phone note/dfs   History of Present Illness Visit Type: Follow-up Visit Primary GI MD: Verl Blalock MD FACP FAGA Primary Provider: n/a Requesting Provider: n/a Chief Complaint: generalized abd pain, and severe bloating  History of Present Illness:   30 YO FEMALE KNOWN TO DR. PATTERSON WITH CROHNS ILEITIS. SHE HAS BEEN ON HUMIRA  SINCE DECEMBER 2010, AND HAD BEEN DOING WELL. SHE HAS DEVELOPED ABDOMINAL DISCOMFORT, BLOATING,, AND DISTENTION .PROGRESSIVE SXS POST PRANDIALLY OVER THE PAST MONTH OR SO. STOOLS ABOUT THE SAME WITH 4-5 LOOSE STOOLS PER DAY.  SHE SAYS SHE USUALLY FEELS BEST IN THE MORNING AND GETS MORE UNCOMFORTABLE AND BLOATED DURING THE DAY.SHE IS ADAMANTLY AGAINST PREDNISONE. RECENT CT ABDOMEN ON 10/08/09 SHOWS ACTIVE ILEITIS -DISTAL,NO OBSTRUCTION,MODERATE SUSPICION FOR A STRICTURE OF DISTAL ILEUM.   GI Review of Systems    Reports abdominal pain, bloating, and  nausea.     Location of  Abdominal pain: lower abdomen.    Denies acid reflux, belching, chest pain, dysphagia with liquids, dysphagia with solids, heartburn, loss of appetite, vomiting blood, and  weight loss.      Reports diarrhea.     Denies anal fissure, black tarry stools, change in bowel habit, constipation, diverticulosis, fecal incontinence, heme positive stool, hemorrhoids, irritable bowel syndrome, jaundice, light color stool, liver problems, rectal bleeding, and  rectal pain.    Current Medications (verified): 1)  Nexium 40 Mg Cpdr (Esomeprazole Magnesium) .... Take One By Mouth Two Times A Day 2)  Lisinopril 10 Mg Tabs (Lisinopril) .Marland Kitchen.. 1 By Mouth Qd 3)  Klonopin 0.5 Mg Tabs (Clonazepam) .Marland Kitchen.. 1 By Mouth Q 12 Hours As Needed Anxiety/panic Attack 4)  Humira 40 Mg/0.42m Kit (Adalimumab) .... Every 2 Weeks 5)  Thiamine Hcl 100 Mg/ml Soln (Thiamine Hcl) ..Marland Kitchen. 100 Mg Im Daily X 5 Days, Then 100 Mg Monthly 6)  Nucynta 50 Mg Tabs (Tapentadol Hcl) ..Marland Kitchen. 1 Q 4-6 Hrs As Needed Pain 7)  Cyanocobalamin 1000  Mcg/ml Soln (Cyanocobalamin) .... As Directed 8)  Caltrate 600 1500 Mg Tabs (Calcium Carbonate) .... One Tablet By Mouth Two Times A Day  Allergies (verified): 1)  ! 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 Endometrial Ablation Breast Implants-2004  Family History: Reviewed history from 04/28/2009 and no changes required. Family History of Breast Cancer: Mother Family History of Esophageal Cancer: Maternal Grandfather Family History of Stomach Cancer: Maternal Grandfather Family History of Clotting disorder: Mother No Family History of IBD No FH of Colon Cancer:  Social History: Reviewed history from 03/24/2009 and no changes required. Occupation: JUST STARTED A NEW JOB Patient currently smokes. -5 cig. daily Alcohol Use - yes-rare Daily Caffeine Use-1 cups daily Illicit Drug Use - no Patient does not get regular exercise.   Review of Systems  The patient denies allergy/sinus, anemia, anxiety-new, arthritis/joint pain, back pain, blood in urine, breast changes/lumps, change in vision, confusion, cough, coughing up blood, depression-new, fainting, fatigue, fever, headaches-new, hearing problems, heart murmur, heart rhythm changes, itching, menstrual pain, muscle pains/cramps, night sweats, nosebleeds, pregnancy symptoms, shortness of breath, skin rash, sleeping problems, sore throat, swelling of feet/legs, swollen lymph glands, thirst - excessive , urination - excessive , urination changes/pain, urine leakage, vision changes, and voice change.         ROS OTHERWISE AS IN HPI  Vital Signs:  Patient  profile:   30 year old female Height:      69 inches Weight:      162 pounds BMI:     24.01 BSA:     1.89 Pulse rate:   76 / minute Pulse  rhythm:   regular BP sitting:   122 / 80  (left arm) Cuff size:   regular  Vitals Entered By: Hope Pigeon CMA (October 29, 2009 2:19 PM)  Physical Exam  General:  Well developed, well nourished, no acute distress. Head:  Normocephalic and atraumatic. Eyes:  PERRLA, no icterus. Lungs:  Clear throughout to auscultation. Heart:  Regular rate and rhythm; no murmurs, rubs,  or bruits. Abdomen:  SOFT, OBESE, TENDER, RMQ/RLQ, NO GUARDING, NO REBOUND, BS+ Rectal:  NOT DONE Extremities:  No clubbing, cyanosis, edema or deformities noted. Neurologic:  Alert and  oriented x4;  grossly normal neurologically. Psych:  Alert and cooperative. Normal mood and affect.   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  CONTINUE HUMIRA 40 MG Q 2 WEEKS ADD TRIAL OF LOW RESIDUE,LOW GAS DIET FLAGYL 250 3 X DAILY X 10 DAYS -?BACT OVERGROWTH ADD ALIGN ONE DAILY PROCEED WITH APPT AT [facility] FOR SECOND OPINION REGARDING MANAGEMENT Orders: TLB-CBC Platelet - w/Differential ([id number])  Problem # 2:  VITAMIN B1 DEFICIENCY (ICD-265.1) Assessment: Comment Only ON REPLACEMENT-INJECTION TODAY  Problem # 3:  HYPERTENSION (ICD-401.9) Assessment: Comment Only  Problem # 4:  GERD (ICD-530.81) Assessment: Unchanged  Other Orders: TLB-CRP-High Sensitivity (C-Reactive Protein) ([id number])  Patient Instructions: 1)  The PA wants you to take a probiotic Align. You can get this at [employer], [place], [place] or any pharmacy. Coupon provided. 2)  We sent a perscription for Flagyl to your pharmacy. 3)  We have a perscription for Percocet we faxed to the pharmacy. 4)  Low Gas Diet and Low Fiber diet brochure given. 5)  Made appt for you with Dr. Sherrye Payor, MD for 11-25-09. Appt card given. Prescriptions: PERCOCET 5-325 MG TABS (OXYCODONE-ACETAMINOPHEN)  Take 1 tab evey 6-8 hours as needed  #20 x 0   Entered by:   Marisue Humble NCMA   Authorized by:   Alfredia Ferguson PA-c   Signed by:   Marisue Humble NCMA on 10/29/2009   Method used:   Printed then faxed [facility] ...       [id number]       [address]       [place], [place]  [address]       Ph: [phone]       Fax: [phone]   RxID:   [id number] METRONIDAZOLE 250 MG TABS (METRONIDAZOLE) Take 1 tab 3 times x 10 days  #30 x 0   Entered by:   Marisue Humble NCMA   Authorized by:   Alfredia Ferguson PA-c   Signed by:   Marisue Humble NCMA on 10/29/2009   Method used:   Electronically to        [facility] [phone]* (retail)       [address]       [place], [place]  [address]       Ph: [phone]       Fax: [phone]   RxID:   [id number]   Appended Document: See  phone note/dfs    Clinical Lists Changes  Orders: Added new Test order of [facility]) - Signed

## 2010-09-07 NOTE — Assessment & Plan Note (Signed)
Summary: 2nd of 3, Please schedule next week/dfs  Nurse Visit  Medication Administration  Injection # 1:    Medication: Vit B12 1000 mcg    Diagnosis: B12 DEFICIENCY (ICD-266.2)    Route: IM    Site: L deltoid    Exp Date: 12/2010    Lot #: [id number]    Mfr: [employer]    Comments: Pt will return on 10/5 for next injection.    Patient tolerated injection without complications    Given by: Francee Piccolo CMA (AAMA)  Orders Added: 1)  Vit B12 1000 mcg [J3420]

## 2010-09-07 NOTE — Assessment & Plan Note (Signed)
Summary: follow up from ER..? DX crohns/lk   History of Present Illness Visit Type: Initial Visit Primary GI MD: Sheryn Bison MD FACP FAGA Primary Provider: N/A Chief Complaint: Patient here for f/u after ER visit. She complains of 4 months constant generalized abdominal pain as well as diarrhea. She has notices some blood when she uses the bathroom but states that it is a small amount. Patient also c/o nausea, vomiting and loss of appetitie. She denies any fever or upper GI symptoms. History of Present Illness:   [name] was last seen in 2003 when she had an EGD for nausea and vomiting. She was found to have reflux esophagitis. For last two years she has been having intermittent episodes diffuse abdominal pain, nausea, vomiting and diarrhea. Aug. 12th visit to ER - WBC 12.3, Hgb 14.7, normal MCV. K+ 2.7, urine pregnancy test negative. Normal renal and liver function studies. CTscan abdomen and pelvis showed mucosal thickening of distal and terminal ileum suggesting IBD.  Patient was given Rx. for Cipro and Flagyl and is on day #5 of treatment. Her symptoms usually flare every couple of months but they have become more constant since birth of son five months ago.  Has up to 10 loose BMs a day, more if she eats. No significant hematochiezia but sometimes sees bright red blood with wiping. Meals exacerbate her diffuse abdominal pain. Lying down helps the pain. No significant weight loss. No recent fevers. Gives history of arthritis of back. No other joint pain but complains of recent bilateral lower extremity leg pain. No skin rashes. In between these all these episodes the patient feels relatively well with only mild abdominal pain, mild nausea and firm stools.   Hx. of GERD since 2003. Nexium once daily helps. Complains of solid food dysphagia, mailnly breads and meats, doesn't know about rice because she doesn't eat it.    GI Review of Systems    Reports abdominal pain, acid reflux, dysphagia with  solids, heartburn, nausea, and  vomiting.     Location of  Abdominal pain: diffuse.    Denies belching, bloating, chest pain, dysphagia with liquids, loss of appetite, vomiting blood, weight loss, and  weight gain.      Reports diarrhea and  rectal bleeding.      Current Medications (verified): 1)  Nexium 40 Mg Cpdr (Esomeprazole Magnesium) .... Take 1 Tablet By Mouth Once A Day 2)  Klor-Con M20 20 Meq Cr-Tabs (Potassium Chloride Crys Cr) .... Take 1 Tablet By Mouth Once A Day 3)  Cipro 500 Mg Tabs (Ciprofloxacin Hcl) .... Take 1 Tablet Two Times A Day X 10 Days 4)  Flagyl 500 Mg Tabs (Metronidazole) .... Takes 1 Tablet By Mouth Once Daily X 10 Days. 5)  Vicodin 5-500 Mg Tabs (Hydrocodone-Acetaminophen) .... Take 1-2 Tablets By Mouth Every 6 Hours As Needed For Pain  Allergies (verified): 1)  ! Penicillin  Past History:  Past Medical History: Current Problems:  HYPERTENSION (ICD-401.9) GERD (ICD-530.81) HEADACHE, CHRONIC (ICD-784.0) ANXIETY (ICD-300.00) HYPOKALEMIA  Past Surgical History: Endometrial Ablation Breast Implants-2004  Family History: Family History of Breast Cancer: Mother Family History of Esophageal Cancer: Maternal Grandfather Family History of Stomach Cancer: Maternal Grandfather Family History of Clotting disorder: Mother No Family History of IBD  Social History: Occupation: Unemployed Patient currently smokes. -5 cig. daily Alcohol Use - yes-rare Daily Caffeine Use-1 cups daily Illicit Drug Use - no Patient does not get regular exercise.  Smoking Status:  current Drug Use:  no Does Patient Exercise:  no  Review of Systems       The patient complains of arthritis/joint pain, back pain, fatigue, muscle pains/cramps, sleeping problems, and thirst - excessive.         Complains of fatigue, chronic back pain, chronic anxiety, migraines headaches. All other ROS reviewed and negative other than where noted in HPI.  Vital Signs:  Patient profile:    30 year old female Height:      63 inches Weight:      130.25 pounds BMI:     23.16 BSA:     1.61 Pulse rate:   84 / minute Pulse rhythm:   regular BP sitting:   132 / 86  (left arm)  Vitals Entered By: Lowry Ram NCMA (March 24, 2009 2:11 PM)  Physical Exam  General:  Well developed, well nourished, no acute distress. Head:  Normocephalic and atraumatic. Eyes:  Conjunctiva pink, no icterus.  Mouth:  No oral lesions. Tongue moist.  Neck:  Supple; no masses. Lungs:  Clear throughout to auscultation. Heart:  Regular rate and rhythm; no murmurs, rubs,  or bruits. Abdomen:  Soft, nondistended. Diffusely tender on exam but more so in the epigastrium and RLQ. No peritoneal signs. No masses felt.  Msk:  Symmetrical with no gross deformities. Normal posture. Extremities:  No palmar erythema, no edema.  Neurologic:  Alert and  oriented x4;  grossly normal neurologically. Skin:  Intact without significant lesions or rashes. Cervical Nodes:  No significant cervical adenopathy. Psych:  Alert and cooperative. Normal mood and affect.   Impression & Recommendations:  Problem # 1:  ABDOMINAL PAIN, UNSPECIFIED SITE (ICD-789.00) Assessment Deteriorated  Two year history of intermittent diffuse abdominal pain, nausea, vomiting and diarrhea in setting of recent CT scan of abd / pelvis showing mucosal thickening of the distal and terminal ileum (see report below). Doubt infectious given chronicity of symptoms. Rule out Crohn's. The patient will be scheduled for a colonoscopy with biopsies (if indicated).  The risks and benefits of the procedure, as well as alternatives were discussed with the patient and she agrees to proceed. In meantime, low-residue diet, Phenergan for nausea and Percocet for pain. Note, patient five months post-partum. She is NOT breastfeeding. Discussed need to stop smoking, especially if diagnosed with Crohn's.  Orders: Colon/Endo (Colon/Endo)  Problem # 2:  Hx of  HYPOKALEMIA, HX OF (ICD-V12.2) Assessment: Comment Only See #1.  Hgb. 2.7 in ER 03/19/09. Now on potassium supplements.   Problem # 3:  NONSPECIFIC ABN FINDING RAD & OTH EXAM GI TRACT (ICD-793.4) Assessment: Comment Only See #1.   Problem # 4:  DYSPHAGIA (LKG-401.02) Assessment: Deteriorated Solid food dysphagia. Frequently occurs with bread, meats. History of reflux esophagitis, on daily Nexium with good control of reflux symptoms. Rule out stricture. The patient will be scheduled for an EGD with biopsies/ esophageal dilation ( if indicated).  The risks and benefits of the procedure, as well as alternatives were discussed with the patient and she agrees to proceed. EGD will be done same day as colonoscopy. Continue PPI.  Small bites of food with lots of liquids to avoid impaction.   Patient Instructions: 1)  We have given you a signed prescription for Percocet to take to your pharmacy. 2)  We sent the prescription for the colonoscopy prep and Phenergan to your pharmacy [facility]. 3)  We scheduled the Colonoscopy and Endoscopy with Dr. Jarold Motto for 04-01-09 at 3:30 PM.  4)  Colonoscopy, Endoscopy and conscous sedation brochures provided. 5)  The  medication list was reviewed and reconciled.  All changed / newly prescribed medications were explained.  A complete medication list was provided to the patient / caregiver. Prescriptions: PROMETHAZINE HCL 12.5 MG TABS (PROMETHAZINE HCL) Take 1 tqab every 4 hours as needed for nausea  #30 x 1   Entered by:   Lowry Ram NCMA   Authorized by:   Willette Cluster NP   Signed by:   Lowry Ram NCMA on 03/24/2009   Method used:   [facility]        [facility] [phone][facility] ([facility]ail)       [address]       [place], [place]  [address]       Ph: [phone]       Fax: [phone]   RxID:   [id number] PERCOCET 5-325 MG TABS (OXYCODONE-ACETAMINOPHEN) Take 1 tab every 6 hours as needed for pain  #30 x 0   Entered  by:   Lowry Ram NCMA   Authorized by:   Willette Cluster NP   Signed by:   Lowry Ram NCMA on 03/24/2009   Method used:   Printed then faxed to ...       [id number]       [address]       [place], [place]  [address]       Ph: [phone]       Fax: [phone]   RxID:   [id number] MOVIPREP 100 GM  SOLR (PEG-KCL-NACL-NASULF-NA ASC-C) As per prep instructions.  #1 x 0   Entered by:   Lowry Ram NCMA   Authorized by:   Willette Cluster NP   Signed by:   Lowry Ram NCMA on 03/24/2009   Method used:   [facility]        [facility] [address] [phone]* (retail)       [address]       [place], [place]  [address]       Ph: [phone]       Fax: [phone]   RxID:   [id number]    CT Abdomen/Pelvis  Procedure date:  03/19/2009  Findings:      CT ABDOMEN    Findings:  The lung bases are clear.  Bilateral breast implants are   noted.  The liver enhances with no focal abnormality and no ductal   dilatation is seen.  No calcified gallstones are noted.  The   pancreas is normal in size and the pancreatic duct is not dilated.   The adrenal glands and spleen appear normal.  The stomach is not   well distended.  There are small bilateral renal calculi present   but no hydronephrosis is seen.  The proximal ureters are normal in   caliber.  The abdominal aorta is normal in caliber.  No adenopathy   is seen.    IMPRESSION:    Small bilateral nonobstructing renal calculi.  No hydronephrosis.    CT PELVIS    Findings:  There is mucosal thickening of the distal and terminal   ileum most consistent with inflammatory bowel disease such as   Crohn's disease.  No abscess is seen.  There are a few prominent   nodes adjacent to the distal and terminal ileum.  The appendix is   well seen  and appears normal.  The urinary bladder is unremarkable.   There are bilateral ovarian cysts left greater than right  with a   small amount of free fluid in the cul-de-sac.  The uterus is   somewhat inhomogeneous in enhancement which may indicate the   presence of small fibroids.    IMPRESSION:    1.  Mucosal edema of the distal and terminal ileum most consistent   with changes of inflammatory bowel disease, such as Crohn's   disease.  No abscess is seen.   2.  Ovarian cysts, left larger than right with a small amount of   free fluid in cul-de-sac.   3.  Inhomogeneous enhancement of the uterus may indicate small   fibroids.    Read By:  Juline Patch,  M.D.

## 2010-09-07 NOTE — Progress Notes (Signed)
Summary: Medication refill  Phone Note Call from Patient Call back at Home Phone [phone]   Caller: Patient Call For: Dr. Sharlett Iles Reason for Call: Refill Medication Summary of Call: Pt needs a refill on her Percocet. The Tramadol is "not working" Initial call taken by: Webb Laws,  September 24, 2009 12:15 PM  Follow-up for Phone Call        Try new pain rx .Marland Kitchen Follow-up by: Sable Feil MD Ethel Rana 8:52 AM  Additional Follow-up for Phone Call Additional follow up Details #1::        talked with pt.  She states she is having abd pain.  No certain area.  Hurts all over abd.  Some days rt side hurts, and some days it's the left side.  Should she go ahead with CT scan or OV? Additional Follow-up by: Alberteen Spindle RN,  September 25, 2009 2:15 PM    Additional Follow-up for Phone Call Additional follow up Details #2::    CT SCAN Follow-up by: Sable Feil MD Marval Regal,  September 25, 2009 3:00 PM  Additional Follow-up for Phone Call Additional follow up Details #3:: Details for Additional Follow-up Action Taken: CT scan ordered.  Nucynta 50 mg sent to pharmacy.  Pt notified. Additional Follow-up by: Alberteen Spindle RN,  September 25, 2009 3:38 PM  New/Updated Medications: NUCYNTA 50 MG TABS (TAPENTADOL HCL) 1 q 4-6 hrs as needed pain Prescriptions: NUCYNTA 50 MG TABS (TAPENTADOL HCL) 1 q 4-6 hrs as needed pain  #20 x 0   Entered by:   Alberteen Spindle RN   Authorized by:   Sable Feil MD [facility]   Signed by:   Alberteen Spindle RN on 09/25/2009   Method used:   Printed then faxed to ...       [id number]       [address]       [place], [place]  [address]       Ph: [phone]       Fax: [phone]   RxID:   [id number]

## 2010-09-07 NOTE — Progress Notes (Signed)
Summary: Potassium  Phone Note Call from Patient Call back at [facility] Phone [phone]   Caller: Patient Call For: Dr. Sharlett Iles Reason for Call: Refill Medication Summary of Call: Potassium [facility]ill needed.... [facility] [address] Initial call taken by: Lucien Mons,  Dec 30, 2009 8:52 AM  Follow-up for Phone Call        Talked with pt, needs refill on Klor-conm 10 meq, two times a day.    New/Updated Medications: KLOR-CON M10 10 MEQ CR-TABS (POTASSIUM CHLORIDE CRYS CR) 2 tabs by mouth once daily Prescriptions: KLOR-CON M10 10 MEQ CR-TABS (POTASSIUM CHLORIDE CRYS CR) 2 tabs by mouth once daily  #60 x 3   Entered by:   Alberteen Spindle RN   Authorized by:   Sable Feil MD [facility]   Signed by:   Alberteen Spindle RN on 12/30/2009   Method used:   [facility]        [facility] [phone]* (retail)       [address]       [place], [place]  [address]       Ph: [phone]       Fax: [phone]   RxID:   [id number]

## 2010-09-07 NOTE — Assessment & Plan Note (Signed)
Summary: ? crohns flare up/dfs   History of Present Illness Visit Type: Follow-up Visit Primary GI MD: Verl Blalock MD FACP [place] Primary Provider: N/A Requesting Provider: n/a Chief Complaint: CROHNS FLARE History of Present Illness:   30 YO FEMALE WITH CROHNS  ILEITIS WHO  WAS INITIATED ON HUMIRA IN NOVEMBER 2010. SHE HAS DONE WELL WITH MINIMAL SXS UNTIL ABOUT 4 DAYS AGO. SHE DEVELOPED CRAMPY ABDOMINAL PAIN,MID ABDOMEN AND DIARRHEA WITH 4-5 BMS/DAY. NO FEVER,CHILLS. NO MELENA OR HEME. NO APPETITE FOR 2 DAYS BUT BETTER NOW. SHE WAS CALLED IN ULTRAM FOR PAIN WHICH SHE SAYS DOES NOT HELP. NO RECENT ABX, NO FAMILY ILL ETC. SHE DOES ADMIT TO BEING UNDER INCREASED STRESS OVER THE PAST FEW WEEKS(FAMILY/FINANCIAL). HAS DIFFICULTY SLEEPING CHRONICALLY. C/O ONGOING FATIGUE.   GI Review of Systems    Reports abdominal pain.     Location of  Abdominal pain: generalized.    Denies acid reflux, belching, bloating, chest pain, dysphagia with liquids, dysphagia with solids, heartburn, loss of appetite, nausea, vomiting, vomiting blood, and  weight loss.      Reports change in bowel habits and  diarrhea.     Denies anal fissure, black tarry stools, diverticulosis, fecal incontinence, heme positive stool, hemorrhoids, irritable bowel syndrome, jaundice, light color stool, liver problems, rectal bleeding, and  rectal pain.    Current Medications (verified): 1)  Nexium 40 Mg Cpdr (Esomeprazole Magnesium) .... Take One By Mouth Two Times A Day 2)  Slow-Mag 535 (64 Mg) Mg Cr-Tabs (Magnesium Chloride) .... Take Two Tabs By Mouth Once Daily 3)  Tramadol Hcl 50 Mg Tabs (Tramadol Hcl) .Marland Kitchen.. 1 Q 6 Hrs As Needed Pain 4)  Potassium Chloride 40 Meq/47m (20%) Liqd (Potassium Chloride) .... Take 40 Meq Daily in [employer]ia[employer]ice 5)  Lisinopril 10 Mg Tabs (Lisinopril) ..Marland Kitchen. 1 By Mouth Qd 6)  Klonopin 0.5 Mg Tabs (Clonazepam) ..Marland Kitchen. 1 By Mouth Q 12 Hours As Needed Anxiety/panic Attack 7)  Humira 40 Mg/0.866mKit (Adalimumab)  .... Every 2 Weeks  Allergies (verified): 1)  ! Penicillin  Past History:  Past Medical History: B12 DEFICIENCY (ICD-266.2) CROHN'S DISEASE (ICD-555.9)/SMALL BOWEL GERD ANXIETY CHRONIC HEADACHES Hx of HYPOKALEMIA, HX OF (ICD-V12.2)  Past Surgical History: Reviewed history from 05/28/2009 and no changes required. Endometrial Ablation Breast Implants-2004  Family History: Reviewed history from 04/28/2009 and no changes required. Family History of Breast Cancer: Mother Family History of Esophageal Cancer: Maternal Grandfather Family History of Stomach Cancer: Maternal Grandfather Family History of Clotting disorder: Mother No Family History of IBD No FH of Colon Cancer:  Social History: Reviewed history from 03/24/2009 and no changes required. Occupation: Unemployed Patient currently smokes. -5 cig. daily Alcohol Use - yes-rare Daily Caffeine Use-1 cups daily Illicit Drug Use - no Patient does not get regular exercise.   Review of Systems       The patient complains of fatigue and sleeping problems.  The patient denies allergy/sinus, anemia, anxiety-new, arthritis/joint pain, back pain, blood in urine, breast changes/lumps, change in vision, confusion, cough, coughing up blood, depression-new, fainting, fever, headaches-new, hearing problems, heart murmur, heart rhythm changes, itching, menstrual pain, muscle pains/cramps, night sweats, nosebleeds, pregnancy symptoms, shortness of breath, skin rash, sore throat, swelling of feet/legs, swollen lymph glands, thirst - excessive, urination - excessive, urination changes/pain, urine leakage, vision changes, and voice change.         ROS OTHERWISE AS IN HPI  Vital Signs:  Patient profile:   [age]ear old female Height:  69 inches Weight:      156 pounds BMI:     23.12 Pulse rate:   72 / minute Pulse rhythm:   regular BP sitting:   118 / 70  (left arm)  Vitals Entered By: Christian Mate CMA Deborra Medina) (August 24, 2009 11:03  AM)  Physical Exam  General:  Well developed, well nourished, no acute distress. Head:  Normocephalic and atraumatic. Eyes:  PERRLA, no icterus. Lungs:  Clear throughout to auscultation. Heart:  Regular rate and rhythm; no murmurs, rubs,  or bruits. Abdomen:  SOFT, MILD TENDERNESS RLQ, NO MASS OR HSM,BS+ Rectal:  NOT DONE Extremities:  No clubbing, cyanosis, edema or deformities noted. Neurologic:  Alert and  oriented x4;  grossly normal neurologically. Psych:  Alert and cooperative. Normal mood and affect.   Impression & Recommendations:  Problem # 1:  CROHN'S DISEASE-SMALL INTESTINE (ICD-555.0) Assessment Deteriorated 30 YO FEMALE WITH CROHNS ILEITIS.ON HUMIRA WITH EXACERBATION OF GENERAILZED  CRAMPY ABDOMINAL PAIN,LOOSES STOOLS X 4 DAYS. R/O CROHNS EXACERBATION,R/O VIRAL GASTROENTERITIS SUPERIMPOSED.  LABS AS BELOW-HAS HX OF HYPOKALEMIA, HYPOMAGNESEMIA CONTINUE HUMIRA,NEXT DOSE 1/21 D/C ULTRAM PERCOCET 5/325 Q 6 HOURS AS NEEDED FOR PAIN TRIAL OF ROBINUL FORTE 2 MG  TWICE DAILY AS NEEDED FOR CRAMPING PT TO CALL BACK IN ONE WEEK IF NO IMPROVEMENT MAY NEED STEROID COURSE FOLLOW UP DR PATTERSON IN 2 WEEKS. Orders: TLB-BMP (Basic Metabolic Panel-BMET) (37628-BTDVVOH) TLB-CBC Platelet - w/Differential (85025-CBCD) TLB-Magnesium (Mg) (83735-MG) TLB-CRP-High Sensitivity (C-Reactive Protein) (86140-FCRP)  Problem # 2:  B12 DEFICIENCY (ICD-266.2) Assessment: Comment Only ON REPLACEMENT  Problem # 3:  GERD (ICD-530.81) Assessment: Unchanged  Patient Instructions: 1)  Please go to the lab, basement level. 2)  We sent a perscription to your pharmacy for Robinul Foret for cramping. 3)  We have given you a perscription to take to your pharmacy for Percocet. 4)  Stop the Tramadol if using Percocet. 5)  We made you an appointment to followup with Dr. Sharlett Iles for 09-11-09 at 10:30Am. 6)  The medication list was reviewed and reconciled.  All changed / newly prescribed medications were  explained.  A complete medication list was provided to the patient / caregiver. Prescriptions: PERCOCET 5-325 MG TABS (OXYCODONE-ACETAMINOPHEN) Take 1 tab every 4-6 hours as needed for pain  #40 x 0   Entered by:   Marisue Humble NCMA   Authorized by:   Alfredia Ferguson PA-c   Signed by:   Marisue Humble NCMA on 08/24/2009   Method used:   Printed then faxed to ...       [facility]  [address] [id number]       [address]       [place], [place]  [address]       Ph: [phone]       Fax: [phone]   RxID:   [id number] ROBINUL-FORTE 2 MG TABS (GLYCOPYRROLATE) Take 1 tab twice daily as needed for cramping  #60 x 1   Entered by:   Marisue Humble NCMA   Authorized by:   Alfredia Ferguson PA-c   Signed by:   Marisue Humble NCMA on 08/24/2009   Method used:   [facility]        [phone][facility] (retail)       [address]       [place], [place]  [address]       Ph:  [phone]       Fax: [phone]   [place]:   [phone]
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
## 2010-09-07 NOTE — Progress Notes (Signed)
Summary: blood pressure meds  Phone Note Call from Patient Call back at Home Phone [phone]   Caller: Patient Call For: Dr. Jarold Motto Reason for Call: Talk to Nurse Summary of Call: pt says her blood pressure meds were never called into her pharmacy... [facility] in [place] off [address] Initial call taken by: Vallarie Mare,  June 24, 2009 2:58 PM  Follow-up for Phone Call        Will send Rx now.  [facility]  Pt notified. Follow-up by: Ashok Cordia RN,  June 24, 2009 3:08 PM    Prescriptions: LISINOPRIL 10 MG TABS (LISINOPRIL) 1 by mouth qd  #30 x 6   Entered by:   Ashok Cordia RN   Authorized by:   Mardella Layman MD [facility]   Signed by:   Ashok Cordia RN on 06/24/2009   Method used:   Electronically to        [facility] [phone]* (retail)       [address]       [place], [place]  [address]       Ph: [phone]       Fax: [phone]   RxID:   [id number]

## 2010-09-07 NOTE — Medication Information (Signed)
Summary: Prior [employer] for Celebrex[employer]  Prior [employer] for Celebrex[employer]   Imported By: Sherian Rein 02/15/2010 13:46:40  _____________________________________________________________________  External Attachment:    Type:   Image     Comment:   External Document

## 2010-09-07 NOTE — Progress Notes (Signed)
Summary: refills  Phone Note Call from Patient Call back at Home Phone [phone]   Caller: Patient Call For: Sharlett Iles Reason for Call: Refill Medication, Talk to Nurse Summary of Call: Patient wants refills for her Percocet to pick up today. Initial call taken by: Ronalee Red,  February 05, 2010 11:43 AM  Follow-up for Phone Call        Dr Sharlett Iles are you willing to fill Percocet? Follow-up by: Barb Merino RN, CGRN,  February 05, 2010 11:56 AM  Additional Follow-up for Phone Call Additional follow up Details #1::        yes Additional Follow-up by: Sable Feil MD [facility],  February 05, 2010 12:54 PM    Additional Follow-up for Phone Call Additional follow up Details #2::    patient aware that rx is at the front desk Barb Merino RN, CGRN  February 05, 2010 1:35 PM  She did not see [facility], she has a follow up with you on 02/16/10 Barb Merino RN, [facility]  February 05, 2010 1:40 PM Follow-up by: Barb Merino RN, CGRN,  February 05, 2010 1:40 PM  Prescriptions: PERCOCET 5-325 MG TABS (OXYCODONE-ACETAMINOPHEN) Take 1 tab evey 6-8 hours as needed  #20 x 0   Entered by:   Barb Merino RN, CGRN   Authorized by:   Sable Feil MD [facility]   Signed by:   Barb Merino RN, CGRN on 02/05/2010   Method used:   Print then Give to Patient   RxID:   [id number]

## 2010-09-07 NOTE — Progress Notes (Signed)
Summary: Klonopin  Phone Note Call from Patient Call back at [facility] Phone [phone]   Caller: Patient Call For: Dr. Jarold Motto Reason for Call: Talk to Nurse Summary of Call: has run out of Klonipin and so doesnt have enough to tapper off dosage... wants to know if she has misunderstood her directions Initial call taken by: Vallarie Mare,  April 29, 2010 10:30 AM  Follow-up for Phone Call        went over with the patietn again how to taper she was advised to start taper for 2 weeks on 9/15 and she never tapered. She is not taking the effexor either. I have sent 10 tabs for her to do the taper with and advised her again to start the efferox. Follow-up by: Harlow Mares CMA [name]),  April 29, 2010 10:41 AM    New/Updated Medications: KLONOPIN 0.5 MG TABS (CLONAZEPAM) Decrease to one a day for one week and then one tablet every other day for a week. Then STOP and take effexor. Prescriptions: KLONOPIN 0.5 MG TABS (CLONAZEPAM) Decrease to one a day for one week and then one tablet every other day for a week. Then STOP and take effexor.  #10 x 0   Entered by:   Harlow Mares CMA (AAMA)   Authorized by:   Mardella Layman MD [facility]   Signed by:   Harlow Mares CMA (AAMA) on 04/29/2010   Method used:   Printed then faxed to ...       [facility]  [address] [id number]       [address]       [place], [place]  [address]       Ph: [phone]       Fax: [phone]   RxID:   [id number]

## 2010-09-07 NOTE — Progress Notes (Signed)
Summary: adderal  Phone Note From Pharmacy   [facility]er: [phone] Call For: Dr. Jenny Reichmann  Request: Speak with Provider Summary of Call: Pharmacy call stating have rx for adderral. On rx md put not to fill until 06/26/10. Pharmacist states according to there computer rx is due 06/25/10. Want to know if they can fill today. Pt is there. Pls advise Initial call taken by: Tomma Lightning RMA,  June 25, 2010 2:09 PM  Follow-up for Phone Call        ok to fill today Follow-up by: Biagio Borg MD,  June 25, 2010 4:23 PM  Additional Follow-up for Phone Call Additional follow up Details #1::        [facility] with Threasa Beards ok to fill per md Additional Follow-up by: Tomma Lightning RMA,  June 25, 2010 4:32 PM

## 2010-09-07 NOTE — Progress Notes (Signed)
Summary: Humira refill  Phone Note Call from Patient Call back at Home Phone [phone]   Call For: DR Glorya Bartley Reason for Call: Talk to Nurse Summary of Call: [phone] need Humira refill. Was supposed to take one Friday but is completely out. Initial call taken by: Leanor Kail [facility],  September 28, 2009 12:08 PM  Follow-up for Phone Call        Attempted to call number given. "System is experiencing technical difficulties"   Ashok Cordia RN  September 28, 2009 12:13 PM  Spoke with [facility] Pt asisstance.  Pt must call for refills since med is shipped to her home.  They have talked with pt and med is being shipped.  Pt should recieve it on Friday 10/01/09.  Follow-up by: Ashok Cordia RN,  September 28, 2009 4:31 PM

## 2010-09-07 NOTE — Progress Notes (Signed)
Summary: Med refill  Phone Note Call from Patient Call back at Home Phone [phone]   Caller: Patient Call For: Mike Gip Reason for Call: Refill Medication Summary of Call: needs refill of Percocet Initial call taken by: Karna Christmas,  November 20, 2009 3:41 PM    Prescriptions: PERCOCET 5-325 MG TABS (OXYCODONE-ACETAMINOPHEN) Take 1 tab evey 6-8 hours as needed  #20 x 0   Entered by:   Lowry Ram NCMA   Authorized by:   Sammuel Cooper PA-c   Signed by:   Lowry Ram NCMA on 11/20/2009   Method used:   Print then Give to Patient   RxID:   [id number]

## 2010-09-07 NOTE — Progress Notes (Signed)
Summary: REfill  Phone Note From Pharmacy   [facility]  [address] [facility] Summary of Call: Refill req on Clonazepam. Initial call taken by: Ashok Cordia RN,  February 24, 2010 11:45 AM    Prescriptions: KLONOPIN 0.5 MG TABS (CLONAZEPAM) 1 by mouth q 12 hours as needed anxiety/panic attack  #60 x 0   Entered by:   Ashok Cordia RN   Authorized by:   Mardella Layman MD [facility]   Signed by:   Ashok Cordia RN on 02/24/2010   Method used:   Printed then faxed to ...       [facility]  [address] [id number]       [address]       [place], [place]  [address]       Ph: [phone]       Fax: [phone]   RxID:   [id number]

## 2010-09-07 NOTE — Progress Notes (Signed)
Summary: Adderall  Phone Note Call from Patient Call back at Home Phone [phone] Call back at Work Phone [phone]   Caller: Patient Summary of Call: Pt called requesting refill of Adderall Initial call taken by: Margaret Pyle, CMA,  March 22, 2010 1:38 PM  Follow-up for Phone Call        done hardcopy to LIM side B - dahlia  Follow-up by: Corwin Levins MD,  March 22, 2010 5:00 PM  Additional Follow-up for Phone Call Additional follow up Details #1::        Pt informed Rx in cabinet for pt pick up Additional Follow-up by: Margaret Pyle, CMA,  March 23, 2010 8:07 AM    New/Updated Medications: ADDERALL 10 MG TABS (AMPHETAMINE-DEXTROAMPHETAMINE) 1 by mouth two times a day - to fill Mar 22, 2010 Prescriptions: ADDERALL 10 MG TABS (AMPHETAMINE-DEXTROAMPHETAMINE) 1 by mouth two times a day - to fill Mar 22, 2010  #60 x 0   Entered and Authorized by:   Corwin Levins MD   Signed by:   Corwin Levins MD on 03/22/2010   Method used:   Print then Give to Patient   RxID:   [id number]

## 2010-09-07 NOTE — Assessment & Plan Note (Signed)
Summary: B1 INJECTION..#4 OR 5/265.1/SP  Nurse Visit   Allergies: 1)  ! Penicillin  Medication Administration  Injection # 1:    Medication: EMR miscellaneous medications    Diagnosis: VITAMIN B1 DEFICIENCY (ICD-265.1)    Route: IM    Site: R thigh    Exp Date: 05/09/2011    Lot #: [id number]    Mfr: APP PHARM    Patient tolerated injection without complications    Given by: Harlow Mares CMA (AAMA) (September 24, 2009 3:14 PM)  Orders Added: 1)  EMR miscellaneous medications [EMRORAL]

## 2010-09-07 NOTE — Assessment & Plan Note (Signed)
Summary: MONTHLY B12...AS.  Nurse Visit   Allergies: 1)  ! Penicillin  Medication Administration  Injection # 1:    Medication: Vit B12 1000 mcg    Diagnosis: B12 DEFICIENCY (ICD-266.2)    Route: IM    Site: R deltoid    Exp Date: 06/2011    Lot #: [id number]    Mfr: [employer]    Patient tolerated injection without complications    Given by: Hope Pigeon CMA (October 29, 2009 3:23 PM)  Injection # 2:    Medication: EMR miscellaneous medications/ Thiamine HCL 265m/ 253mVit B1    Diagnosis: VITAMIN B1 DEFICIENCY (ICD-265.1)    Route: IM    Site: R thigh    Exp Date: 05/2011    Lot #: [id number]  Mfr: APP Pharm    Comments: Gave 46m28m   Patient tolerated injection without complications    Given by: KelHope PigeonA (October 29, 2009 3:27 PM)  Orders Added: 1)  Vit B12 1000 mcg [[id number]]

## 2010-09-07 NOTE — Progress Notes (Signed)
Summary: Stroke?  Phone Note Call from Patient Call back at Home Phone [phone]   Caller: Patient Summary of Call: pt called stating that she believes she had a mini-stroke (per pt she was told that she has had these before). Pt says her arms were drawn up, LT-side faced droop and could not speak for 2-3 hrs. I asked pt if anyone was home and if so were EMS called. pt said that her husband did not call because this is not unusual for her. She said that after episode she took ASA and went to sleep. OV or ER? please advise Initial call taken by: Crissie Sickles, [place],  Dec 17, 2009 9:38 AM  Follow-up for Phone Call        mini stroke can be sign of bigger stroke tocome so she should go to [facility] ER for eval (has stroke team at [facility]) - these episodes may also be complicated migraines but she should still be seen at hosp where she can have an MRI and other approp w/u done quickly - thanks Follow-up by: Rowe Clack MD,  Dec 17, 2009 9:54 AM  Additional Follow-up for Phone Call Additional follow up Details #1::        pt informed and will go to [facility] ER Additional Follow-up by: Crissie Sickles, [place],  Dec 17, 2009 9:58 AM

## 2010-09-07 NOTE — Progress Notes (Signed)
Summary: Inf Ankle  Phone Note Call from Patient Call back at Work Phone [phone]   Caller: Patient Summary of Call: Pt called requesting appt with JWJ. Pt was advised that there were no appt available today with any MD. Pt states that after OV 07/01 she took ABX and pain meds as advised but it caused diarrhea and vomiting. Pt was eventually admitted to hospital for dehydration and infection (ankle). Pt states she was on IV ABX for three days but had to leave AMA because she had to go to work. Pt was not told what ABX she was one b/c she left AMA. Pt now states that ankle is worse now  than it was initially and this is why she needed appt. I advised pt to go to ER not UC because she may have a serious infection. I advised pt that it would be better for her to possibly miss a few days this weekend while in hospital rather than missed several day over a longer period of time. Pt expresses understanding and agreed to go to ER. Initial call taken by: Margaret Pyle, CMA,  February 12, 2010 4:04 PM  Follow-up for Phone Call        noted Follow-up by: Corwin Levins MD,  February 12, 2010 4:17 PM

## 2010-09-07 NOTE — Progress Notes (Signed)
Summary: meds too expensive  Phone Note Call from Patient Call back at Home Phone [phone]   Caller: Patient Call For: Vista Sawatzky Reason for Call: Talk to Nurse Summary of Call: patient states that the cramping meds (ROBINUL-FORTE 2 MG TABS ) given to her today is too expensive wants something cheaper called in  Initial call taken by: Tawni Levy,  August 24, 2009 1:59 PM    New/Updated Medications: BENTYL 10 MG CAPS (DICYCLOMINE HCL) Take 1 tab 3 times daily before meals as needed for cramping Prescriptions: BENTYL 10 MG CAPS (DICYCLOMINE HCL) Take 1 tab 3 times daily before meals as needed for cramping  #90 x 1   Entered by:   Lowry Ram NCMA   Authorized by:   Sammuel Cooper PA-c   Signed by:   Lowry Ram NCMA on 08/25/2009   Method used:   [facility]        [facility]  [address] [phone][facility] ([facility]ail)       [address]       [place], [place]  [address]       Ph: [phone]       Fax: [phone]   RxID:   [id number]

## 2010-09-07 NOTE — Progress Notes (Signed)
Summary: PA CELEBREX  Phone Note Call from Patient Call back at [facility] Phone [phone]   Summary of Call: Patient left vm that celebrex needs PA and she would like a call back w/status of this.  Initial call taken by: Lamar Sprinkles, CMA,  February 10, 2010 12:43 PM  Follow-up for Phone Call        Spoke with patient and confirmed that she has BCBS not Medicaid. Patient is aware that I will complete BCBS form.   Has the patient tried and failed 2 prescription strendth NSAIDS? And is common migraine the only dx to be used? Please advise. Follow-up by: Lucious Groves,  February 10, 2010 4:07 PM  Additional Follow-up for Phone Call Additional follow up Details #1::        pt has tried OTC alleve - 2 once daily , as well as indocin rx strength, both unable to tolerate due to risk of bleeding with crohn's, as well as prednisone use  Additional Follow-up by: Corwin Levins MD,  February 10, 2010 8:00 PM    Additional Follow-up for Phone Call Additional follow up Details #2::    Paperwork signed by MD and faxed to PA dept. Margaret Pyle, CMA  February 12, 2010 10:23 AM

## 2010-09-07 NOTE — Miscellaneous (Signed)
Summary: reprint klonopin  Clinical Lists Changes  Medications: Rx of KLONOPIN 0.5 MG TABS (CLONAZEPAM) Decrease to one a day for one week and then one tablet every other day for a week. Then STOP and take effexor.;  #10 x 0;  Signed;  Entered by: Harlow Mares CMA (AAMA);  Authorized by: Mardella Layman MD [facility];  Method used: Reprint    Prescriptions: KLONOPIN 0.5 MG TABS (CLONAZEPAM) Decrease to one a day for one week and then one tablet every other day for a week. Then STOP and take effexor.  #10 x 0   Entered by:   Harlow Mares CMA (AAMA)   Authorized by:   Mardella Layman MD [facility]   Signed by:   Harlow Mares CMA (AAMA) on 04/29/2010   Method used:   Reprint   RxID:   [id number]

## 2010-09-07 NOTE — Progress Notes (Signed)
Summary: result  Phone Note Call from Patient Call back at Home Phone [phone]   Caller: Patient Call For: [name] Reason for Call: Talk to Nurse, Lab or Test Results Summary of Call: lab result Initial call taken by: Tawni Levy,  May 15, 2009 3:10 PM  Follow-up for Phone Call        Patient  notified of lab resulst.  She reports she is unable to afford nascobal, she will call back in 1 month to schedule monthly b12.  Patient  also reports that she is still having pain, no improvemnt since increasing entocort back to 3/d .  Patient  is offered an appointment to come in and see Dr Jarold Motto to discuss possible Humira per last phone note.  She is not able to come she will come in and see Mike Gip PA tomorrow at 10:00 Follow-up by: Darcey Nora RN, CGRN,  May 18, 2009 8:48 AM

## 2010-09-07 NOTE — Letter (Signed)
Summary: [place] By: Rise Patience 03/19/2010 15:26:28  _____________________________________________________________________  External Attachment:    Type:   Image     Comment:   External Document

## 2010-09-07 NOTE — Progress Notes (Signed)
Summary: Refill  Phone Note From Pharmacy   [facility]  [address] [facility] Summary of Call: Refill requested for Clonazepam 0.5mg  Initial call taken by: Ashok Cordia RN,  January 22, 2010 2:57 PM    Prescriptions: KLONOPIN 0.5 MG TABS (CLONAZEPAM) 1 by mouth q 12 hours as needed anxiety/panic attack  #60 x 0   Entered by:   Ashok Cordia RN   Authorized by:   Mardella Layman MD [facility]   Signed by:   Ashok Cordia RN on 01/22/2010   Method used:   Printed then faxed to ...       [facility]  [address] [facility]       [address]       [place], [place]  [address]       Ph: [phone]       Fax: [phone]   RxID:   [id number]

## 2010-09-07 NOTE — Progress Notes (Signed)
Summary: Adderall  Phone Note Call from Patient Call back at Home Phone [phone]   Caller: Patient .[phone] Summary of Call: Pt called requesting refills of Adderall Initial call taken by: Margaret Pyle, CMA,  February 15, 2010 11:08 AM  Follow-up for Phone Call        this is now dr. Raphael Gibney pt - thanks Follow-up by: Newt Lukes MD,  February 15, 2010 1:23 PM  Additional Follow-up for Phone Call Additional follow up Details #1::        Pt informed Rx with JWJ CMA for appt tomorrow Additional Follow-up by: Margaret Pyle, CMA,  February 15, 2010 1:49 PM    New/Updated Medications: ADDERALL 10 MG TABS (AMPHETAMINE-DEXTROAMPHETAMINE) 1 by mouth two times a day - to fill February 15, 2010 Prescriptions: ADDERALL 10 MG TABS (AMPHETAMINE-DEXTROAMPHETAMINE) 1 by mouth two times a day - to fill February 15, 2010  #60 x 0   Entered by:   Corwin Levins MD   Authorized by:   Newt Lukes MD   Signed by:   Corwin Levins MD on 02/15/2010   Method used:   Print then Give to Patient   RxID:   [id number]  done hardcopy to LIM side B - dahlia Corwin Levins MD  February 15, 2010 1:31 PM

## 2010-09-07 NOTE — Progress Notes (Signed)
Summary: Triage  Phone Note Call from Patient Call back at Home Phone [phone]   Caller: Patient Call For: Dr. Sharlett Iles Reason for Call: Talk to Nurse Summary of Call: Pt wants to know if she can take her Humira shot? Initial call taken by: Webb Laws,  October 05, 2009 10:48 AM  Follow-up for Phone Call        Talkked with pt.  She is over 2 weeks since having Humira inj.  Pt instructed OK to take med now as long as no signs of infection.  Follow-up by: Alberteen Spindle RN,  October 05, 2009 11:51 AM

## 2010-09-07 NOTE — Progress Notes (Signed)
Summary: Surgeon in [facility]  Phone Note Call from Patient Call back at [facility] Phone [phone]   Call For: Dr Sharlett Iles Summary of Call: Can you find her a surgeon in [facility]. Her boss is giving her a hard time to be off work. Initial call taken by: Irwin Brakeman [facility],  February 23, 2010 8:40 AM  Follow-up for Phone Call        Pt asks if she can be referred to a wound care ctr in [facility].  She works in [employer] and it would be more conveinent for her to go for appt closer to her office.   Follow-up by: Alberteen Spindle RN,  February 23, 2010 8:47 AM  Additional Follow-up for Phone Call Additional follow up Details #1::        LM at [facility] to call re a referral.  [phone].  [place]. Butch Penny Surface RN  February 23, 2010 8:54 AM  Referral forms from [employer] to be faxed.  After completed and sent back,  pt will be contacted with appt.  Butch Penny Surface RN  February 23, 2010 9:15 AM  Forms completed and faxed.   Insurance information provided by patient. Additional Follow-up by: Alberteen Spindle RN,  February 23, 2010 1:43 PM    Additional Follow-up for Phone Call Additional follow up Details #2::    Pt states she has been contacted by the [facility] and will be sch an appt. Follow-up by: Alberteen Spindle RN,  February 24, 2010 2:49 PM

## 2010-09-07 NOTE — Progress Notes (Signed)
Summary: refill request  Phone Note Call from Patient Call back at Home Phone [phone]   Caller: Patient Call For: Dr. Jarold Motto Reason for Call: Refill Medication Summary of Call: would like a [facility] SlowMag [facility] [address] Initial call taken by: Vallarie Mare,  April 20, 2010 4:45 PM  Follow-up for Phone Call        rx sent, patient aware Follow-up by: Harlow Mares CMA [name]),  April 20, 2010 5:02 PM    Prescriptions: SLOW-MAG 71.5-119 MG TBEC (MAGNESIUM CL-CALCIUM CARBONATE) 2 tabs daily  #60 x 6   Entered by:   Harlow Mares CMA (AAMA)   Authorized by:   Mardella Layman MD [facility]   Signed by:   Harlow Mares CMA (AAMA) on 04/20/2010   Method used:   [facility]ron[facility]        [facility]  [address] [phone]* (retail)       [address]       [place], [place]  [address]       Ph: [phone]       Fax: [phone]   RxID:   [id number]

## 2010-09-07 NOTE — Progress Notes (Signed)
Summary: TRIAGE  Phone Note Call from Patient Call back at Home Phone [phone]   Caller: Patient Call For: Maximilien Hayashi  Reason for Call: Talk to Nurse Summary of Call: was seen in the hosp and was told she must see a GI Dr within a week due to Crohns (gi hx with Dr Jarold Motto on 2003) Initial call taken by: Guadlupe Spanish [facility],  March 20, 2009 11:25 AM  Follow-up for Phone Call        sever abd pain  seen in ER on 03-19-2009 and needs seen by GI within a week. ? crohns DX. Patient has no insurance and was given the number to patient assistance so she can get help but needs to be seen before she gets an answer if she is approved or not. I advised her I will call her back when I hear from [employer] [place], site supervisor about doing a one time bill.  Follow-up by: Harlow Mares CMA [name]),  March 20, 2009 12:11 PM  Additional Follow-up for Phone Call Additional follow up Details #1::        ok per shell. Appt made for 03-25-2009 chart req to pam. notes from ER on pams desk. patient aware of appt.  Additional Follow-up by: Harlow Mares CMA (AAMA),  March 20, 2009 1:40 PM

## 2010-09-07 NOTE — Progress Notes (Signed)
Summary: Stil not feeling better  Phone Note Call from Patient Call back at Home Phone [phone] Call back at try work# first [phone]   Call For: Nicoletta Ba, PA Reason for Call: Talk to Nurse Summary of Call: Still not feeling better. Was told to call back with update. Initial call taken by: Irwin Brakeman [facility],  November 11, 2009 2:23 PM  Follow-up for Phone Call        Pt has finished the Flagyl. She said she is still have 2-3 BM daily that is diarrhea.  She is taking the Lomotil for the diarrhea.  She hasn't seen much improvement since she saw Amy.  I offered her an appt with Dr. Sharlett Iles for tomorrow 11-12-09 at 8:45 AM but she declined. She said she has already miss some work and she has a new job.  I urged her to continue the Align and the Lomotil and to call me Friday. Follow-up by: Sharol Roussel,  November 11, 2009 4:22 PM

## 2010-09-07 NOTE — Progress Notes (Signed)
Summary: Darvocet refill  Phone Note From Pharmacy   Summary of Call: Refill requested for Darvocet.  #20 one q 4-6 hrs as needed.  Last filled on 05/22/09. Initial call taken by: Ashok Cordia RN,  June 02, 2009 11:17 AM    Prescriptions: DARVOCET-N 100 100-650 MG TABS (PROPOXYPHENE N-APAP) 1 by mouth q 4-6 hours as needed pain  #30 x 0   Entered by:   Ashok Cordia RN   Authorized by:   Mardella Layman MD [facility]   Signed by:   Ashok Cordia RN on 06/02/2009   Method used:   Printed then faxed to ...       [facility]  [address] [facility]       [address]       [place], [place]  [address]       Ph: [phone]       Fax: [phone]   RxID:   [id number]

## 2010-09-07 NOTE — Assessment & Plan Note (Signed)
Summary: Begin Humira/see Donna/555.9/dfs  Nurse Visit   Allergies: 1)  ! Penicillin  Medication Administration  Injection # 1:    Medication: Vit B12 1000 mcg    Diagnosis: B12 DEFICIENCY (ICD-266.2)    Route: IM    Site: R deltoid    Exp Date: 8/12    Lot #: [id number]    Mfr: American Regent    Patient tolerated injection without complications    Given by: Ashok Cordia RN (June 18, 2009 11:36 AM)  Orders Added: 1)  TLB-CBC Platelet - w/Differential [85025-CBCD] 2)  TLB-BMP (Basic Metabolic Panel-BMET) [80048-METABOL] 3)  Vit B12 1000 mcg [J3420]  Pt watched and correctly demonstrated Humira administration.  Tolerated injections without any problems. Pt instructed to notify md of any signs of infection.  Pt to report back in 2 weeks.     Appended Document: Begin Humira/see Donna/555.9/dfs    Clinical Lists Changes  Medications: Added new medication of PREDNISONE 5 MG TABS (PREDNISONE) Take as directed. - Signed Rx of PREDNISONE 5 MG TABS (PREDNISONE) Take as directed.;  #100 x 1;  Signed;  Entered by: Ashok Cordia RN;  Authorized by: Mardella Layman MD [facility];  Method used: Electronically to [facility] [phone]*, [address] [place], [place]  [address], Ph: [phone], Fax: [phone]    Prescriptions: PREDNISONE 5 MG TABS (PREDNISONE) Take as directed.  #100 x 1   Entered by:   Ashok Cordia RN   Authorized by:   Mardella Layman MD [facility]   Signed by:   Ashok Cordia RN on 06/18/2009   Method used:   Electronically to        [facility] [phone]* (retail)       [address]       [place], [place]  [address]       Ph: [phone]       Fax: [phone]   RxID:   [id number]

## 2010-09-07 NOTE — Progress Notes (Signed)
Summary: Needs Percoet prescription  Phone Note Call from Patient Call back at Home Phone [phone]   Call For: Dr Jarold Motto Summary of Call: Needs a new percoset prescription mailed to her-she will not be in town this week.  Initial call taken by: Leanor Kail [facility],  January 11, 2010 8:17 AM  Follow-up for Phone Call        Rx printed and sent to Dr. Jarold Motto to sign Ashok Cordia RN  January 11, 2010 11:43 AM  rx signed and mailed.  Pt notified.   Pt has appt at [facility] 01/21/10. Follow-up by: Ashok Cordia RN,  January 11, 2010 1:40 PM    Prescriptions: PERCOCET 5-325 MG TABS (OXYCODONE-ACETAMINOPHEN) Take 1 tab evey 6-8 hours as needed  #20 x 0   Entered by:   Ashok Cordia RN   Authorized by:   Mardella Layman MD [facility]   Signed by:   Ashok Cordia RN on 01/11/2010   Method used:   Print then Mail to Patient   RxID:   [id number]

## 2010-09-07 NOTE — Progress Notes (Signed)
Summary: Humira injections  Phone Note Outgoing Call Call back at [facility] Phone [phone]   Call placed by: Harlow Mares CMA [name]),  May 03, 2010 4:03 PM Call placed to: Patient Summary of Call: patient stopped her Humira on 03/29/2010 due to a broken toe she says that her toe is healing but very slow. We need to fill out her patient assistance forms again. so she questions if she should order more and restart or stay off the Humira still. Initial call taken by: Harlow Mares CMA [name]),  May 03, 2010 4:08 PM  Follow-up for Phone Call        SHE NEEDS TO HAVE REGULAR APPTS. WITH PA.Marland KitchenMarland KitchenWE CANNOT CARE FOR OVER THE PHONE....DRP Follow-up by: Mardella Layman MD [facility],  May 03, 2010 4:21 PM  Additional Follow-up for Phone Call Additional follow up Details #1::        Left a message on patients machine to call back, and I will schedule her to see the PA, since Dr. Jarold Motto is off for 2 weeks.  Additional Follow-up by: Harlow Mares CMA [name]),  May 04, 2010 3:08 PM

## 2010-09-07 NOTE — Progress Notes (Signed)
Summary: [name] ?'s  Phone Note Call from Patient Call back at [facility] Phone [phone]   Caller: Patient Call For: Dr. Jarold Motto Reason for Call: Talk to Nurse Summary of Call: pt has questions about [facility] Initial call taken by: Vallarie Mare,  July 08, 2009 1:34 PM  Follow-up for Phone Call        Pt states that for the past 2 weeks she has had a burning in her throat.  Feel like there is a knot in her throat.  Having difficulty swallowing meats.  has to be careful with what she eats.  Milk  causes burning. Pt has decreased prednisone  to 10 mg per day.  She has finished Flagyl.  She is on Nexium two times a day.  Denies heartburn. She has also noticed that finger nails and toenails are breaking.   Follow-up by: Ashok Cordia RN,  July 08, 2009 2:27 PM  Additional Follow-up for Phone Call Additional follow up Details #1::        MYCOSTATIN MW  2TSP by mouth QID FOR 2 WEEKS..IF NOT BETTER WILL NEED OV... Additional Follow-up by: Mardella Layman MD [facility],  July 08, 2009 2:32 PM    Additional Follow-up for Phone Call Additional follow up Details #2::    Pt notiified.   Follow-up by: Ashok Cordia RN,  July 08, 2009 2:54 PM  New/Updated Medications: NYSTATIN 100000 UNIT/ML SUSP (NYSTATIN) 2 tsp qid x 2 weeks Prescriptions: NYSTATIN 100000 UNIT/ML SUSP (NYSTATIN) 2 tsp qid x 2 weeks  #1 pt x 1   Entered by:   Ashok Cordia RN   Authorized by:   Mardella Layman MD [facility]   Signed by:   Ashok Cordia RN on 07/08/2009   Method used:   Electronically to        [facility] [phone]* (retail)       [address]       [place], [place]  [address]       Ph: [phone]       Fax: [phone]   RxID:   [id number]

## 2010-09-07 NOTE — Assessment & Plan Note (Signed)
Summary: MONTHLY B12 SHOT & B1 SHOT...LSW.  Nurse Visit   Allergies: 1)  ! Penicillin  Medication Administration  Injection # 1:    Medication: EMR miscellaneous medications    Diagnosis: VITAMIN B1 DEFICIENCY (ICD-265.1)    Route: IM    Site: LUOQ gluteus    Exp Date: 05/2011    Lot #: [id number]    Comments: pt to schedule next monthly B1 and B12 at the front desk.  Pt states the Hip area was much less painflu than the thigh or the deltoid    Patient tolerated injection without complications    Given by: Christian Mate CMA Deborra Medina) (December 02, 2009 4:35 PM)  Injection # 3:    Medication: Vit B12 1000 mcg    Diagnosis: B12 DEFICIENCY (ICD-266.2)    Route: IM    Site: L deltoid    Exp Date: 09/2011    Lot #: [id number]    Mfr: American Regent    Comments: pt to schedule next monthly B1 and B12 at front desk    Patient tolerated injection without complications    Given by: Christian Mate CMA Deborra Medina) (December 02, 2009 4:36 PM)  Orders Added: 1)  Vit B12 1000 mcg [J3420] 2)  Vit B12 1000 mcg [L9767]
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
## 2010-09-07 NOTE — Assessment & Plan Note (Signed)
Summary: B1 AND B12 SHOTS...LSW.  Nurse Visit   Allergies: 1)  ! Penicillin  Medication Administration  Injection # 1:    Medication: Vit B12 1000 mcg    Diagnosis: B12 DEFICIENCY (ICD-266.2)    Route: IM    Site: L deltoid    Exp Date: 09/2011    Lot #: [id number]    Mfr: American Regent    Patient tolerated injection without complications    Given by: Genella Mech CMA ([place]) (February 05, 2010 4:26 PM)  Injection # 2:    Medication: thiamine    Diagnosis: VITAMIN B1 DEFICIENCY (ICD-265.1)    Route: IM    Site: RUOQ gluteus    Exp Date: 06/2011    Lot #: [id number]    Mfr: [place]    Patient tolerated injection without complications    Given by: Genella Mech CMA ([place]) (February 05, 2010 4:30 PM)  Orders Added: 1)  Vit B12 1000 mcg [Z5015]

## 2010-09-07 NOTE — Assessment & Plan Note (Signed)
Summary: B1 injection 5/5...265.1//lk  Nurse Visit   Medication Administration  Injection # 1:    Medication: EMR miscellaneous medications    Diagnosis: VITAMIN B1 DEFICIENCY (ICD-265.1)    Route: IM    Site: L thigh    Exp Date: 10/12    Lot #: [id number]    Comments: Thiamine (B1 injection) 2mg /51mL--1 mL given.  Manufactured by [employer].  Pt will return on 3/18 for next injection.  Pt to receive B12 at the same time.    Patient tolerated injection without complications    Given by: Francee Piccolo CMA [name]) (September 25, 2009 3:53 PM)  Orders Added: 1)  EMR miscellaneous medications [EMRORAL]

## 2010-09-07 NOTE — Progress Notes (Signed)
Summary: follow up from labs  Phone Note Outgoing Call   Call placed by: Harlow Mares CMA [name]),  April 07, 2009 3:05 PM Summary of Call: called patient to advise her to increase her K+ to two times a day... she was wondering if Dr. Jarold Motto had a chance to look at the chest xray and consider giving her antibiotics? Initial call taken by: Harlow Mares CMA [name]),  April 07, 2009 3:06 PM  Follow-up for Phone Call        CXR is normal Follow-up by: Mardella Layman MD [facility],  April 07, 2009 4:41 PM  Additional Follow-up for Phone Call Additional follow up Details #1::        see labs. Additional Follow-up by: Harlow Mares CMA ([facility]),  April 07, 2009 5:00 PM     Appended Document: follow up from labs she was placed on Slow-Mag tabs two times a day and  Z-pac per her symptoms.on8/31/10   drp

## 2010-09-07 NOTE — Progress Notes (Signed)
Summary: med change  Phone Note Call from Patient Call back at Home Phone [phone]   Caller: Patient Reason for Call: Talk to Doctor Summary of Call: Pt left vm requesting md to change klonopin to xanax. Klonopin is not helping with her sxs. Initial call taken by: Orlan Leavens RMA,  April 23, 2010 9:58 AM  Follow-up for Phone Call        Called pt back no ansew [facility] will need f/u appt with md before he came change rx.  Follow-up by: Orlan Leavens RMA,  April 23, 2010 11:39 AM

## 2010-09-07 NOTE — Assessment & Plan Note (Signed)
Summary: ANKLE INJURY/NWS   Vital Signs:  Patient profile:   30 year old female Height:      69 inches (175.26 cm) Weight:      150.25 pounds (68.30 kg) BMI:     22.27 O2 Sat:      97 % on Room air Temp:     97.7 degrees F (36.50 degrees C) oral Pulse rate:   104 / minute BP sitting:   132 / 78  Vitals Entered By: Rebeca Alert MA (February 05, 2010 3:36 PM)  O2 Flow:  Room air CC: pt c/o right ankle pain after fall/aj Comments pt states she is no longer taking robaxin indomethacin orusing  metronidazole gel   Primary Care Provider:  Rowe Clack MD  CC:  pt c/o right ankle pain after fall/aj.  History of Present Illness: here with several issues after an incident 4 dyas ago where she was trying to ascend outside wooden porch stairs and the stair broke stepping with the right leg;  Suffered marked abrasion to the mid ant shin with immediate pain that improved by the next day, but in the last 2 days unfortunately has developed increased low grade fever, pain, redness, swelling from the abrasion distally to involve the leg/ankle and partial dorsal foot  anteriorly only.  No ankle or other foot pain or swelling , but also has a marked localized separate pain, redness, swelling to the right ant knee at the distal patella level.  Also the jarring of the accident seems to have cause incresaed lower back pain acute on recurrent chronic; with pain across the lower back, worse on the right, mild to mod, onset at time of accident, persistent but not getting worse; not assoc with LE pain/weak/numb, no bowel or bladder changes, falls or other injury.  Worse to get up from sitting.  Current meds not adequate for symptoms.  Overall good compliance with meds.  Due to get Humira this friday.  Imitrex used to work ok for migraine -  no headache today, but has had increased freq headaches in the past month, typical for her migraine without assoc aura or known trigger.  No recent abd pain or increased GI  symptoms or bowel freq or blood .  She currently is not concerned about cost of meds, as she is part of a [place] assist program covering all cost, but this will only last until sept 2011  Problems Prior to Update: 1)  Back Pain  (ICD-724.5) 2)  Bursitis, Right Knee  (ICD-726.60) 3)  Cellulitis, Leg, Right  (ICD-682.6) 4)  Back Pain, Chronic  (ICD-724.5) 5)  Cervicalgia  (ICD-723.1) 6)  Adhd  (ICD-314.01) 7)  Acne Rosacea  (ICD-695.3) 8)  Crohn's Disease-small Intestine  (ICD-555.0) 9)  Hypertension  (ICD-401.9) 10)  Anxiety  (ICD-300.00) 11)  Gerd  (ICD-530.81) 12)  Headache, Chronic  (ICD-784.0) 13)  Smoker  (ICD-305.1) 14)  Vitamin B1 Deficiency  (ICD-265.1) 15)  B12 Deficiency  (ICD-266.2) 16)  Abdominal Pain, Unspecified Site  (ICD-789.00)  Medications Prior to Update: 1)  Nexium 40 Mg Cpdr (Esomeprazole Magnesium) .... Take One By Mouth Two Times A Day 2)  Lisinopril 10 Mg Tabs (Lisinopril) .Marland Kitchen.. 1 By Mouth Qd 3)  Klonopin 0.5 Mg Tabs (Clonazepam) .Marland Kitchen.. 1 By Mouth Q 12 Hours As Needed Anxiety/panic Attack 4)  Humira 40 Mg/0.29m Kit (Adalimumab) .... Every 2 Weeks 5)  Thiamine Hcl 100 Mg/ml Soln (Thiamine Hcl) ..Marland Kitchen. 100 Mg Im Daily X 5 Days, Then  100 Mg Monthly 6)  Cyanocobalamin 1000 Mcg/ml Soln (Cyanocobalamin) .... As Directed 7)  Caltrate 600 1500 Mg Tabs (Calcium Carbonate) .... One Tablet By Mouth Two Times A Day 8)  Percocet 5-325 Mg Tabs (Oxycodone-Acetaminophen) .... Take 1 Tab Evey 6-8 Hours As Needed 9)  Lomotil 2.5-0.025 Mg  Tabs (Diphenoxylate-Atropine) .Marland Kitchen.. 1 Q 8 Hrs As Needed Dairrhea 10)  Flexeril 10 Mg Tabs (Cyclobenzaprine Hcl) .... Take 1 Three Times A Day As Needed 11)  Promethazine Hcl 12.5 Mg Tabs (Promethazine Hcl) .... Take 1 Q 4 Hours As Needed 12)  Robaxin-750 750 Mg Tabs (Methocarbamol) .Marland Kitchen.. 1 By Mouth Three Times A Day As Needed For Muscle Spasm Pain 13)  Indomethacin 25 Mg Caps (Indomethacin) .Marland Kitchen.. 1 By Mouth Two Times A Day As Needed For Pain 14)   Metronidazole 0.75 % Gel (Metronidazole) .... Apply At Bedtime 15)  Adderall 10 Mg Tabs (Amphetamine-Dextroamphetamine) .Marland Kitchen.. 1 By Mouth Two Times A Day - To Fill January 07, 2010 16)  Klor-Con M10 10 Meq Cr-Tabs (Potassium Chloride Crys Cr) .... 2 Tabs By Mouth Once Daily  Current Medications (verified): 1)  Nexium 40 Mg Cpdr (Esomeprazole Magnesium) .... Take One By Mouth Two Times A Day 2)  Lisinopril 10 Mg Tabs (Lisinopril) .Marland Kitchen.. 1 By Mouth Qd 3)  Klonopin 0.5 Mg Tabs (Clonazepam) .Marland Kitchen.. 1 By Mouth Q 12 Hours As Needed Anxiety/panic Attack 4)  Humira 40 Mg/0.37m Kit (Adalimumab) .... Every 2 Weeks 5)  Thiamine Hcl 100 Mg/ml Soln (Thiamine Hcl) ..Marland Kitchen. 100 Mg Im Daily X 5 Days, Then 100 Mg Monthly 6)  Cyanocobalamin 1000 Mcg/ml Soln (Cyanocobalamin) .... As Directed 7)  Caltrate 600 1500 Mg Tabs (Calcium Carbonate) .... One Tablet By Mouth Two Times A Day 8)  Percocet 5-325 Mg Tabs (Oxycodone-Acetaminophen) .... Take 1 Tab Evey 6-8 Hours As Needed 9)  Lomotil 2.5-0.025 Mg  Tabs (Diphenoxylate-Atropine) ..Marland Kitchen. 1 Q 8 Hrs As Needed Dairrhea 10)  Flexeril 10 Mg Tabs (Cyclobenzaprine Hcl) .... Take 1 Three Times A Day As Needed 11)  Promethazine Hcl 12.5 Mg Tabs (Promethazine Hcl) .... Take 1 Q 4 Hours As Needed 12)  Celebrex 200 Mg Caps (Celecoxib) ..Marland Kitchen. 1po Two Times A Day As Needed 13)  Adderall 10 Mg Tabs (Amphetamine-Dextroamphetamine) ..Marland Kitchen. 1 By Mouth Two Times A Day - To Fill January 07, 2010 14)  Klor-Con M10 10 Meq Cr-Tabs (Potassium Chloride Crys Cr) .... 2 Tabs By Mouth Once Daily 15)  Doxycycline Hyclate 100 Mg Caps (Doxycycline Hyclate) ..Marland Kitchen. 1po Two Times A Day 16)  Sumatriptan Succinate 100 Mg Tabs (Sumatriptan Succinate) ..Marland Kitchen. 1 By Mouth Every Other Day As Needed  Allergies (verified): 1)  ! 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 -5 cig. daily Alcohol Use - yes-rare Daily Caffeine Use-1 cups daily Illicit Drug Use - no Patient does not get regular exercise.  lives with financee and baby son (born 11/2008)  Risk Factors: Exercise: no (12/02/2009)  Risk Factors: Smoking Status: current (12/02/2009)  Review of Systems       all otherwise negative per pt -    Physical Exam  General:  alert and well-developed  to mild overwt; mild ill  Head:  normocephalic and atraumatic.   Eyes:  vision grossly intact, pupils equal, and pupils round.   Ears:  R ear normal and L ear normal.   Nose:  no external deformity and no nasal discharge.   Mouth:  no gingival abnormalities and pharynx pink and moist.   Neck:  supple and no masses.   Lungs:  normal respiratory effort and normal breath sounds.   Heart:  normal rate and regular rhythm.   Abdomen:  soft, non-tender, and normal bowel sounds.   Msk:  no joint tenderness and no joint swelling.  , spine nontender though there is mild tender without swelling or rash to the right paravertebral and upper right buttock area approx l5 level;  also noted right ant knee with mild bursa type swelling and tender at the distal patella area without drainage or fluctuance as well -  knee o/w FROM, non tender and no effusion Extremities:  no edema, no erythema except as below Neurologic:  strength normal in all extremities and gait normal.   Skin:  right LE with 1.5 cm superfical ant mid tibial abrasion;  with mild to mod erythema, tender and swelling from the abrasion to the mid dorsal foot involving the ant aspect of leg only;  no abscess , fluctuance or drainage noted.;  foot o/w neurovasc intact;  no red  streaks or right groin LA Psych:  moderately anxious.     Impression & Recommendations:  Problem # 1:  CELLULITIS, LEG, RIGHT (ICD-682.6)  Her updated medication list for this problem includes:    Doxycycline Hyclate 100 Mg Caps (Doxycycline hyclate) .Marland Kitchen... 1po two times a day treat as above, f/u any worsening signs or symptoms or increased , or increased red/swelling/tender past the "line" I drew on the leg to outline the area of most significant infection  - will need to go to ER if worsens on outpt by mouth antibx  Problem # 2:  BURSITIS, RIGHT KNEE (ICD-726.60) d/w pt;  tx for pain, should improve without specific tx  Problem # 3:  BACK PAIN (ICD-724.5)  The following medications were removed from the medication list:    Robaxin-750 750 Mg Tabs (Methocarbamol) .Marland Kitchen... 1 by mouth three times a day as needed for muscle spasm pain Her updated medication list for this problem includes:    Percocet 5-325 Mg Tabs (Oxycodone-acetaminophen) .Marland Kitchen... Take 1 tab evey 6-8 hours as needed    Flexeril 10 Mg Tabs (Cyclobenzaprine hcl) .Marland Kitchen... Take 1 three times a day as needed    Celebrex 200 Mg Caps (Celecoxib) .Marland Kitchen... 1po two times a day as needed treat as above, f/u any worsening signs or symptoms   Problem # 4:  CROHN'S DISEASE-SMALL INTESTINE (ICD-555.0) advised pt to hold humira dose this wk, and resume next wk (friday) as long as cellulitis successfully treated  Problem # 5:  COMMON MIGRAINE (ICD-346.10)  Her updated medication list for this problem includes:    Celebrex 200 Mg Caps (Celecoxib) .Marland Kitchen... 1po two times a day as needed    Sumatriptan Succinate 100 Mg Tabs (Sumatriptan succinate) .Marland Kitchen... 1 by mouth every other day as needed treat as above, f/u any worsening signs or symptoms   Complete Medication List: 1)  Nexium 40 Mg Cpdr (Esomeprazole magnesium) .... Take one by mouth two times a day 2)  Lisinopril 10 Mg Tabs (Lisinopril) .Marland Kitchen.. 1 by mouth qd 3)  Klonopin 0.5 Mg Tabs (Clonazepam)  .Marland Kitchen.. 1 by mouth q  12 hours as needed anxiety/panic attack 4)  Humira 40 Mg/0.79m Kit (Adalimumab) .... Every 2 weeks 5)  Thiamine Hcl 100 Mg/ml Soln (Thiamine hcl) ..Marland Kitchen. 100 mg im daily x 5 days, then 100 mg monthly 6)  Cyanocobalamin 1000 Mcg/ml Soln (Cyanocobalamin) .... As directed 7)  Caltrate 600 1500 Mg Tabs (Calcium carbonate) .... One tablet by mouth two times a day 8)  Percocet 5-325 Mg Tabs (Oxycodone-acetaminophen) .... Take 1 tab evey 6-8 hours as needed 9)  Lomotil 2.5-0.025 Mg Tabs (Diphenoxylate-atropine) ..Marland Kitchen. 1 q 8 hrs as needed dairrhea 10)  Flexeril 10 Mg Tabs (Cyclobenzaprine hcl) .... Take 1 three times a day as needed 11)  Promethazine Hcl 12.5 Mg Tabs (Promethazine hcl) .... Take 1 q 4 hours as needed 12)  Celebrex 200 Mg Caps (Celecoxib) ..Marland Kitchen. 1po two times a day as needed 13)  Adderall 10 Mg Tabs (Amphetamine-dextroamphetamine) ..Marland Kitchen. 1 by mouth two times a day - to fill January 07, 2010 14)  Klor-con M10 10 Meq Cr-tabs (Potassium chloride crys cr) .... 2 tabs by mouth once daily 15)  Doxycycline Hyclate 100 Mg Caps (Doxycycline hyclate) ..Marland Kitchen. 1po two times a day 16)  Sumatriptan Succinate 100 Mg Tabs (Sumatriptan succinate) ..Marland Kitchen. 1 by mouth every other day as needed  Patient Instructions: 1)  Please take all new medications as prescribed - the antibiotic, celebrex for pain,  and generic imitrex as well for migraine 2)  Continue all previous medications as before this visit, except stop the robaxin, and indomethacin  3)  Please hold on taking the Humira this wk - ok to take next friday instead 4)  Please schedule an appointment with your primary doctor as needed; and go to ER if the right leg does not appear to be improving in the next 1-2 days Prescriptions: FLEXERIL 10 MG TABS (CYCLOBENZAPRINE HCL) take 1 three times a day as needed  #60 x 1   Entered and Authorized by:   JBiagio BorgMD   Signed by:   JBiagio BorgMD on 02/05/2010   Method used:   Print then Give to Patient    RxID:   [id number] SUCCINATE 100 MG TABS (SUMATRIPTAN SUCCINATE) 1 by mouth every other day as needed  #9 x 2   Entered and Authorized by:   JBiagio BorgMD   Signed by:   JBiagio BorgMD on 02/05/2010   Method used:   Print then Give to Patient   RxID:   [id number] 200 MG CAPS (CELECOXIB) 1po two times a day as needed  #60 x 2   Entered and Authorized by:   JBiagio BorgMD   Signed by:   JBiagio BorgMD on 02/05/2010   Method used:   Print then Give to Patient   RxID:   [id number] 5-325 MG TABS (OXYCODONE-ACETAMINOPHEN) Take 1 tab evey 6-8 hours as needed  #30 x 0   Entered and Authorized by:   JBiagio BorgMD   Signed by:   JBiagio BorgMD on 02/05/2010   Method used:   Print then Give to Patient   RxID::   [id number] HYCLATE 100 MG CAPS (DOXYCYCLINE HYCLATE) 1po two times a day  #20 x 0   Entered and Authorized by:   JBiagio BorgMD   Signed by:   JBiagio BorgMD on 02/05/2010   Method used:   Print then Give to Patient   RxID:  [id number]  

## 2010-09-07 NOTE — Assessment & Plan Note (Signed)
Summary: f/u leg per pt/transfer from [facility] to [name]/ok'd/cd   Vital Signs:  Patient profile:   30 year old female Height:      64 inches Weight:      147.50 pounds BMI:     25.41 O2 Sat:      99 % on Room air Temp:     98.1 degrees F oral Pulse rate:   106 / minute BP sitting:   132 / 100  (left arm) Cuff size:   regular  Vitals Entered By: [name] CMA [name]) (February 16, 2010 3:10 PM)  O2 Flow:  Room air CC: Right leg absess/RE   Primary Care Provider:  Newt Lukes MD  CC:  Right leg absess/RE.  History of Present Illness: here after seen july 1 , tx with doxycycline but unfort could not take due to n/v;  was hospd july 4 for 2 days with IV antibx x 2;  left AMA;  went to urgent care july 8  after became worse with abscess - did I&D of small abscess  - tx with septra.   woulnd Culture neg per pt report.   Overall much improved today with only small skin defect, minimal swelling and erythema surrounding; still holding on taking the humira for now. No fever, red streaks, worsening red, swelling/tender and no significant worsening drainage.  Pt denies CP, sob, doe, wheezing, orthopnea, pnd, worsening LE edema, palps, dizziness or syncope   Problems Prior to Update: 1)  Cellulitis and Abscess of Leg Except Foot  (ICD-682.6) 2)  Common Migraine  (ICD-346.10) 3)  Back Pain  (ICD-724.5) 4)  Bursitis, Right Knee  (ICD-726.60) 5)  Cellulitis, Leg, Right  (ICD-682.6) 6)  Back Pain, Chronic  (ICD-724.5) 7)  Cervicalgia  (ICD-723.1) 8)  Adhd  (ICD-314.01) 9)  Acne Rosacea  (ICD-695.3) 10)  Crohn's Disease-small Intestine  (ICD-555.0) 11)  Hypertension  (ICD-401.9) 12)  Anxiety  (ICD-300.00) 13)  Gerd  (ICD-530.81) 14)  Headache, Chronic  (ICD-784.0) 15)  Smoker  (ICD-305.1) 16)  Vitamin B1 Deficiency  (ICD-265.1) 17)  B12 Deficiency  (ICD-266.2) 18)  Abdominal Pain, Unspecified Site  (ICD-789.00)  Medications Prior to Update: 1)  Nexium 40 Mg Cpdr (Esomeprazole  Magnesium) .... Take One By Mouth Two Times A Day 2)  Lisinopril 10 Mg Tabs (Lisinopril) .Marland Kitchen.. 1 By Mouth Qd 3)  Klonopin 0.5 Mg Tabs (Clonazepam) .Marland Kitchen.. 1 By Mouth Q 12 Hours As Needed Anxiety/panic Attack 4)  Humira 40 Mg/0.96ml Kit (Adalimumab) .... Every 2 Weeks 5)  Thiamine Hcl 100 Mg/ml Soln (Thiamine Hcl) .Marland Kitchen.. 100 Mg Im Daily X 5 Days, Then 100 Mg Monthly 6)  Cyanocobalamin 1000 Mcg/ml Soln (Cyanocobalamin) .... As Directed 7)  Caltrate 600 1500 Mg Tabs (Calcium Carbonate) .... One Tablet By Mouth Two Times A Day 8)  Percocet 5-325 Mg Tabs (Oxycodone-Acetaminophen) .... Take 1 Tab Evey 6-8 Hours As Needed 9)  Lomotil 2.5-0.025 Mg  Tabs (Diphenoxylate-Atropine) .Marland Kitchen.. 1 Q 8 Hrs As Needed Dairrhea 10)  Flexeril 10 Mg Tabs (Cyclobenzaprine Hcl) .... Take 1 Three Times A Day As Needed 11)  Promethazine Hcl 12.5 Mg Tabs (Promethazine Hcl) .... Take 1 Q 4 Hours As Needed 12)  Celebrex 200 Mg Caps (Celecoxib) .Marland Kitchen.. 1po Two Times A Day As Needed 13)  Adderall 10 Mg Tabs (Amphetamine-Dextroamphetamine) .Marland Kitchen.. 1 By Mouth Two Times A Day - To Fill February 15, 2010 14)  Klor-Con M10 10 Meq Cr-Tabs (Potassium Chloride Crys Cr) .... 2 Tabs By Mouth Once  Daily 15)  Doxycycline Hyclate 100 Mg Caps (Doxycycline Hyclate) .Marland Kitchen.. 1po Two Times A Day 16)  Sumatriptan Succinate 100 Mg Tabs (Sumatriptan Succinate) .Marland Kitchen.. 1 By Mouth Every Other Day As Needed  Current Medications (verified): 1)  Nexium 40 Mg Cpdr (Esomeprazole Magnesium) .... Take One By Mouth Two Times A Day 2)  Lisinopril 10 Mg Tabs (Lisinopril) .Marland Kitchen.. 1 By Mouth Qd 3)  Klonopin 0.5 Mg Tabs (Clonazepam) .Marland Kitchen.. 1 By Mouth Q 12 Hours As Needed Anxiety/panic Attack 4)  Humira 40 Mg/0.2ml Kit (Adalimumab) .... Every 2 Weeks 5)  Thiamine Hcl 100 Mg/ml Soln (Thiamine Hcl) .Marland Kitchen.. 100 Mg Im Daily X 5 Days, Then 100 Mg Monthly 6)  Cyanocobalamin 1000 Mcg/ml Soln (Cyanocobalamin) .... As Directed 7)  Caltrate 600 1500 Mg Tabs (Calcium Carbonate) .... One Tablet By Mouth  Two Times A Day 8)  Percocet 5-325 Mg Tabs (Oxycodone-Acetaminophen) .... Take 1 Tab Evey 6-8 Hours As Needed 9)  Lomotil 2.5-0.025 Mg  Tabs (Diphenoxylate-Atropine) .Marland Kitchen.. 1 Q 8 Hrs As Needed Dairrhea 10)  Flexeril 10 Mg Tabs (Cyclobenzaprine Hcl) .... Take 1 Three Times A Day As Needed 11)  Promethazine Hcl 12.5 Mg Tabs (Promethazine Hcl) .... Take 1 Q 4 Hours As Needed 12)  Celebrex 200 Mg Caps (Celecoxib) .Marland Kitchen.. 1po Two Times A Day As Needed 13)  Adderall 10 Mg Tabs (Amphetamine-Dextroamphetamine) .Marland Kitchen.. 1 By Mouth Two Times A Day - To Fill February 15, 2010 14)  Klor-Con M10 10 Meq Cr-Tabs (Potassium Chloride Crys Cr) .... 2 Tabs By Mouth Once Daily 15)  Septra Ds 800-160 Mg Tabs (Sulfamethoxazole-Trimethoprim) .Marland Kitchen.. 1po Two Times A Day 16)  Sumatriptan Succinate 100 Mg Tabs (Sumatriptan Succinate) .Marland Kitchen.. 1 By Mouth Every Other Day As Needed  Allergies (verified): 1)  ! Penicillin 2)  ! 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 -5 cig. daily Alcohol Use - yes-rare Daily Caffeine Use-1 cups daily Illicit Drug Use - no Patient does not get regular exercise.  lives with financee and baby son (born 11/2008)  Risk Factors: Exercise: no (12/02/2009)  Risk Factors: Smoking Status: current (12/02/2009)  Review of Systems       all otherwise negative per pt -    Physical Exam  General:  alert and overweight-appearing.  , not ill appearing Head:  normocephalic and atraumatic.   Eyes:  vision grossly intact, pupils equal, and pupils round.     Ears:  R ear normal and L ear normal.   Nose:  no external deformity and no nasal discharge.   Mouth:  no gingival abnormalities and pharynx pink and moist.   Neck:  supple and no masses.   Lungs:  normal respiratory effort and normal breath sounds.   Heart:  normal rate and regular rhythm.   Extremities:  no edema, no erythema  Skin:  mid ant RLE pretibial  with 1/2 cm oval shallow 5 mm wound with no signficant drainage, and 1 cm surrounding mild erythema, tender, swelling without fluctuance  right knee no redness, swelling, tender Psych:  not anxious appearing and not depressed appearing.     Impression & Recommendations:  Problem # 1:  CELLULITIS AND ABSCESS OF LEG EXCEPT FOOT (ICD-682.6)  Her updated medication list for this problem includes:    Septra Ds 800-160 Mg Tabs (Sulfamethoxazole-trimethoprim) .Marland Kitchen... 1po two times a day stable to improved; Continue all previous medications as before this visit , ok for Humira later this wk as long as cont's to improve  Problem # 2:  CROHN'S DISEASE-SMALL INTESTINE (ICD-555.0) ok for humira this wk as above - cont same meds  Problem # 3:  BURSITIS, RIGHT KNEE (ICD-726.60) resolved - ok to follow  Problem # 4:  ADHD (ICD-314.01) stable overall by hx and exam, ok to continue meds/tx as is   Complete Medication List: 1)  Nexium 40 Mg Cpdr (Esomeprazole magnesium) .... Take one by mouth two times a day 2)  Lisinopril 10 Mg Tabs (Lisinopril) .Marland Kitchen.. 1 by mouth qd 3)  Klonopin 0.5 Mg Tabs (Clonazepam) .Marland Kitchen.. 1 by mouth q 12 hours as needed anxiety/panic attack 4)  Humira 40 Mg/0.56ml Kit (Adalimumab) .... Every 2 weeks 5)  Thiamine Hcl 100 Mg/ml Soln (Thiamine hcl) .Marland Kitchen.. 100 mg im daily x 5 days, then 100 mg monthly 6)  Cyanocobalamin 1000 Mcg/ml Soln (Cyanocobalamin) .... As directed 7)  Caltrate 600 1500 Mg Tabs (Calcium carbonate) .... One tablet by mouth two times a day 8)  Percocet 5-325 Mg Tabs (Oxycodone-acetaminophen) .... Take 1 tab  evey 6-8 hours as needed 9)  Lomotil 2.5-0.025 Mg Tabs (Diphenoxylate-atropine) .Marland Kitchen.. 1 q 8 hrs as needed dairrhea 10)  Flexeril 10 Mg Tabs (Cyclobenzaprine hcl) .... Take 1 three times a day as needed 11)  Promethazine Hcl 12.5 Mg Tabs (Promethazine hcl) .... Take 1 q 4 hours as needed 12)  Celebrex 200 Mg Caps (Celecoxib) .Marland Kitchen.. 1po two times a day as needed 13)  Adderall 10 Mg Tabs (Amphetamine-dextroamphetamine) .Marland Kitchen.. 1 by mouth two times a day - to fill February 15, 2010 14)  Klor-con M10 10 Meq Cr-tabs (Potassium chloride crys cr) .... 2 tabs by mouth bid 15)  Septra Ds 800-160 Mg Tabs (Sulfamethoxazole-trimethoprim) .Marland Kitchen.. 1po two times a day 16)  Sumatriptan Succinate 100 Mg Tabs (Sumatriptan succinate) .Marland Kitchen.. 1 by mouth every other day as needed 17)  Slow-mag 71.5-119 Mg Tbec (Magnesium cl-calcium carbonate) .... 2 tabs daily  Patient Instructions: 1)  Continue all previous medications as before this visit 2)  Please schedule a follow-up appointment as needed.
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
## 2010-09-09 NOTE — Progress Notes (Signed)
Summary: Percocet  Phone Note Call from Patient   Caller: Patient [phone] Summary of Call: Pt called requesting refills of Percocet Initial call taken by: Margaret Pyle, CMA,  July 23, 2010 10:19 AM  Follow-up for Phone Call        done hardcopy to LIM side B - dahlia  Follow-up by: Corwin Levins MD,  July 23, 2010 12:06 PM  Additional Follow-up for Phone Call Additional follow up Details #1::        Pt advised that req Rx is available for pick up. Rx in cabinet for pick up Additional Follow-up by: Margaret Pyle, CMA,  July 23, 2010 1:21 PM    New/Updated Medications: PERCOCET 5-325 MG TABS (OXYCODONE-ACETAMINOPHEN) Take 1 tab two times a day as needed - to fill Jul 23, 2010 Prescriptions: PERCOCET 5-325 MG TABS (OXYCODONE-ACETAMINOPHEN) Take 1 tab two times a day as needed - to fill Jul 23, 2010  #60 x 0   Entered and Authorized by:   Corwin Levins MD   Signed by:   Corwin Levins MD on 07/23/2010   Method used:   Print then Give to Patient   RxID:   [id number]

## 2010-09-09 NOTE — Letter (Signed)
Summary: [facility]  [address] [place], [place] [address]   Phone: [phone]  Fax: [phone]    Today's Date: August 12, 2010  Name of Patient: [name]  The above named patient had a medical visit today at: 8 am.  Please take this into consideration when reviewing the time away from work/school.    Special Instructions:  [ X ] None  [  ] To be off the remainder of today, returning to the normal work / school schedule tomorrow.  [  ] To be off until the next scheduled appointment on ______________________.  [  ] Other ________________________________________________________________ ________________________________________________________________________   Sincerely yours,   Margaret Pyle, CMA

## 2010-09-09 NOTE — Progress Notes (Signed)
Summary: medication refill  Phone Note Refill Request Message from:  Fax from Pharmacy on August 30, 2010 11:26 AM  Refills Requested: Medication #1:  ALPRAZOLAM 0.5 MG TABS 1po two times a day as needed   Dosage confirmed as above?Dosage Confirmed   Last Refilled: 05/20/2010   Notes: [facility] [address] Initial call taken by: Zella Ball Ewing CMA [name]),  August 30, 2010 11:26 AM    New/Updated Medications: ALPRAZOLAM 0.5 MG TABS (ALPRAZOLAM) 1po two times a day as needed Prescriptions: ALPRAZOLAM 0.5 MG TABS (ALPRAZOLAM) 1po two times a day as needed  #60 x 2   Entered and Authorized by:   Corwin Levins MD   Signed by:   Corwin Levins MD on 08/30/2010   Method used:   Print then Give to Patient   RxID:   [id number]  Rx faxed to pharmacy Margaret Pyle, CMA  August 30, 2010 1:20 PM

## 2010-09-09 NOTE — Progress Notes (Signed)
  Phone Note Refill Request Message from:  Fax from Pharmacy on July 23, 2010 10:34 AM  Refills Requested: Medication #1:  FLEXERIL 10 MG TABS take 1 three times a day as needed   Dosage confirmed as above?Dosage Confirmed   Last Refilled: 02/05/2010   [facility]  Medication #2:  SUMATRIPTAN SUCCINATE 100 MG TABS 1 by mouth every other day as needed   Dosage confirmed as above?Dosage Confirmed   Last Refilled: 02/05/2010   [facility] Initial call taken by: Robin Ewing CMA (AAMA),  July 23, 2010 10:35 AM    Prescriptions: SUMATRIPTAN SUCCINATE 100 MG TABS (SUMATRIPTAN SUCCINATE) 1 by mouth every other day as needed  #9 x 2   Entered by:   Sharon Seller CMA ([place])   Authorized by:   Biagio Borg MD   Signed by:   Sharon Seller CMA ([place]) on 07/23/2010   Method used:   [facility]ed [facility] ...       [facility] [address] [id number]       [address], [place]  [address]       Ph: [phone]       Fax: [phone]   RxID:   [id number] FLEXERIL 10 MG TABS (CYCLOBENZAPRINE HCL) take 1 three times a day as needed  #60 x 1   Entered by:   Sharon Seller CMA ([place])   Authorized by:   Biagio Borg MD   Signed by:   Sharon Seller CMA ([place]) on 07/23/2010   Method used:   F[facility]ed to ...       [facility] [id number] ([facility]       [address]       [place], [place]  [address]       Ph: [phone]       Fax: [phone]   RxID:   [id number]
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
## 2010-09-09 NOTE — Progress Notes (Signed)
Summary: Percocet/JWJ pt  Phone Note Call from Patient   Caller: Patient [phone] Summary of Call: Pt called requesting refill of Percocet Initial call taken by: Margaret Pyle, CMA,  August 23, 2010 12:51 PM  Follow-up for Phone Call        ok for 30d supply for JWJ's pt Follow-up by: Newt Lukes MD,  August 23, 2010 2:17 PM  Additional Follow-up for Phone Call Additional follow up Details #1::        Pt informed of Rx, pt is also requesting ear drops for OM she saw JWJ for 01/05. Pt says she is not having much pain but her hearing has decreased.  Please advise.  Additional Follow-up by: Margaret Pyle, CMA,  August 23, 2010 3:02 PM    Additional Follow-up for Phone Call Additional follow up Details #2::    i do not see any ear drop rx - ok to try cipro hc - need ROV with jwj if not improved, sooner if worse Newt Lukes MD  August 23, 2010 3:16 PM   Pt informed. Margaret Pyle, CMA  August 23, 2010 3:23 PM   New/Updated Medications: PERCOCET 5-325 MG TABS (OXYCODONE-ACETAMINOPHEN) Take 1 tab two times a day as needed CIPRO HC 0.2-1 % SUSP (CIPROFLOXACIN-HYDROCORTISONE) 3 drops to affected ear two times a day x 5 days Prescriptions: CIPRO HC 0.2-1 % SUSP (CIPROFLOXACIN-HYDROCORTISONE) 3 drops to affected ear two times a day x 5 days  #1 x 0   Entered and Authorized by:   Newt Lukes MD   Signed by:   Newt Lukes MD on 08/23/2010   Method used:   [facility]        [facility] [address] [phone]       [address]       [place], [place]  [address]       Ph: [phone]       Fax: [phone]   RxID:   [id number] PERCOCET 5-325 MG TABS (OXYCODONE-ACETAMINOPHEN) Take 1 tab two times a day as needed  #60 x 0   Entered and Authorized by:   Newt Lukes MD   Signed by:   Newt Lukes MD on 08/23/2010   Method used:   Print then Give to Patient   RxID:    [id number]

## 2010-09-09 NOTE — Progress Notes (Signed)
  Phone Note Call from Patient Call back at Home Phone [phone]   Caller: [phone] Call For: Biagio Borg MD Summary of Call: Pt seen yesteday and prescribed antibiotic, she does not have insurance and the cost is over $300. Can a cheaper one or sample be substitued? Please advise. Please let pt know. Initial call taken by: Denice Paradise,  August 13, 2010 12:19 PM  Follow-up for Phone Call        left message on machine for pt to return my call. Crissie Sickles, CMA  August 13, 2010 1:38 PM   Additional Follow-up for Phone Call Additional follow up Details #1::        Called pt. informed [facility]. Additional Follow-up by: Robin Ewing CMA ([place]),  August 13, 2010 1:44 PM    New/Updated Medications: CEPHALEXIN 500 MG CAPS (CEPHALEXIN) 1 by mouth three times a day Prescriptions: CEPHALEXIN 500 MG CAPS (CEPHALEXIN) 1 by mouth three times a day  #30 x 0   Entered and Authorized by:   Biagio Borg MD   Signed by:   Biagio Borg MD on 08/13/2010   Method used:   Print then Give to Patient   RxID:   [id number]  done hardcopy to LIM side B - dahlia  Biagio Borg MD  August 13, 2010 12:56 PM

## 2010-09-10 NOTE — Medication Information (Signed)
Summary: Approved PPA Celebrex/[employer]  Approved PPA Celebrex/[employer]   Imported By: Lennie Odor 02/17/2010 17:14:15  _____________________________________________________________________  External Attachment:    Type:   Image     Comment:   External Document

## 2010-09-15 NOTE — Progress Notes (Signed)
Summary: PA-Cortisporin  Phone Note From Pharmacy   Summary of Call: PA-Cortisporin, called [facility] @ [phone], awaiting form. [name]  September 02, 2010 11:42 AM Form to Dr Felicity Coyer to complete. [name]  September 02, 2010 3:11 PM   Follow-up for Phone Call        per last phone note, i thought this med was covered - cancel PA and change to oflox drops Follow-up by: Newt Lukes MD,  September 06, 2010 8:27 AM    New/Updated Medications: OCUFLOX 0.3 % SOLN (OFLOXACIN) 10 drops to affecetd ear once daily x 7 days Prescriptions: OCUFLOX 0.3 % SOLN (OFLOXACIN) 10 drops to affecetd ear once daily x 7 days  #1 x 0   Entered and Authorized by:   Newt Lukes MD   Signed by:   Newt Lukes MD on 09/06/2010   Method used:   [facility]        [facility] [address] [phone]* (retail)       [address]       [place], [place]  [address]       Ph: [phone]       Fax: [phone]   RxID:   [id number]

## 2010-09-28 ENCOUNTER — Telehealth: Payer: Self-pay | Admitting: Internal Medicine

## 2010-10-05 ENCOUNTER — Telehealth: Payer: Self-pay | Admitting: Internal Medicine

## 2010-10-05 NOTE — Progress Notes (Signed)
Summary: Percocet  Phone Note Call from Patient   Caller: Patient [phone] Summary of Call: Pt called requesting refill of Percocet Initial call taken by: Margaret Pyle, CMA,  September 28, 2010 1:48 PM  Follow-up for Phone Call        done hardcopy to LIM side B - dahlia  \par Follow-up by: Corwin Levins MD,  September 28, 2010 2:17 PM  Additional Follow-up for Phone Call Additional follow up Details #1::        Pt advised via VM, Rx in cabinet for pt pick up Additional Follow-up by: Margaret Pyle, CMA,  September 28, 2010 3:26 PM    New/Updated Medications: PERCOCET 5-325 MG TABS (OXYCODONE-ACETAMINOPHEN) Take 1 tab two times a day as needed - to fill Sep 27, 2010 Prescriptions: PERCOCET 5-325 MG TABS (OXYCODONE-ACETAMINOPHEN) Take 1 tab two times a day as needed - to fill Sep 27, 2010  #60 x 0   Entered and Authorized by:   Corwin Levins MD   Signed by:   Corwin Levins MD on 09/28/2010   Method used:   Print then Give to Patient   RxID:   [id number]

## 2010-10-14 NOTE — Progress Notes (Signed)
  Phone Note Refill Request Message from:  Fax from Pharmacy on October 05, 2010 9:37 AM  Refills Requested: Medication #1:  FLEXERIL 10 MG TABS take 1 three times a day as needed   Dosage confirmed as above?Dosage Confirmed   Last Refilled: 07/23/2010   Notes: [facility] [phone] Initial call taken by: Shirlean Mylar Ewing CMA Deborra Medina),  October 05, 2010 9:38 AM    Prescriptions: FLEXERIL 10 MG TABS (CYCLOBENZAPRINE HCL) take 1 three times a day as needed  #90 x 1   Entered by:   Sharon Seller CMA ([place])   Authorized by:   Biagio Borg MD   Signed by:   Sharon Seller CMA ([place]) on 10/05/2010   Method used:   Faxed to ...       [id number]       [address]       [place], [place]  [address]       Ph: [phone]       Fax: [phone]   RxID:   [id number]

## 2010-10-21 ENCOUNTER — Inpatient Hospital Stay (HOSPITAL_COMMUNITY)
Admission: EM | Admit: 2010-10-21 | Discharge: 2010-10-26 | DRG: 386 | Disposition: A | Discharge status: Home / Self Care | Payer: Medicaid Other | Attending: Internal Medicine | Admitting: Internal Medicine

## 2010-10-21 ENCOUNTER — Emergency Department (HOSPITAL_COMMUNITY): Payer: Medicaid Other

## 2010-10-21 DIAGNOSIS — K5000 Crohn's disease of small intestine without complications: Principal | ICD-10-CM | POA: Diagnosis present

## 2010-10-21 DIAGNOSIS — T380X5A Adverse effect of glucocorticoids and synthetic analogues, initial encounter: Secondary | ICD-10-CM | POA: Diagnosis present

## 2010-10-21 DIAGNOSIS — F17200 Nicotine dependence, unspecified, uncomplicated: Secondary | ICD-10-CM | POA: Diagnosis present

## 2010-10-21 DIAGNOSIS — F411 Generalized anxiety disorder: Secondary | ICD-10-CM | POA: Diagnosis present

## 2010-10-21 DIAGNOSIS — E876 Hypokalemia: Secondary | ICD-10-CM | POA: Diagnosis present

## 2010-10-21 DIAGNOSIS — F988 Other specified behavioral and emotional disorders with onset usually occurring in childhood and adolescence: Secondary | ICD-10-CM | POA: Diagnosis present

## 2010-10-21 DIAGNOSIS — R112 Nausea with vomiting, unspecified: Secondary | ICD-10-CM | POA: Diagnosis present

## 2010-10-21 DIAGNOSIS — K909 Intestinal malabsorption, unspecified: Secondary | ICD-10-CM | POA: Diagnosis present

## 2010-10-21 DIAGNOSIS — R7309 Other abnormal glucose: Secondary | ICD-10-CM | POA: Diagnosis present

## 2010-10-21 DIAGNOSIS — R197 Diarrhea, unspecified: Secondary | ICD-10-CM | POA: Diagnosis present

## 2010-10-21 LAB — DIFFERENTIAL
Basophils Absolute: 0 10*3/uL (ref 0.0–0.1)
Basophils Relative: 0 % (ref 0–1)
Eosinophils Absolute: 0 10*3/uL (ref 0.0–0.7)
Eosinophils Relative: 0 % (ref 0–5)
Lymphocytes Relative: 5 % — ABNORMAL LOW (ref 12–46)
Lymphs Abs: 0.7 10*3/uL (ref 0.7–4.0)
Monocytes Absolute: 0.6 10*3/uL (ref 0.1–1.0)
Monocytes Relative: 5 % (ref 3–12)
Neutro Abs: 11.8 10*3/uL — ABNORMAL HIGH (ref 1.7–7.7)
Neutrophils Relative %: 90 % — ABNORMAL HIGH (ref 43–77)

## 2010-10-21 LAB — CBC
HCT: 40.4 % (ref 36.0–46.0)
Hemoglobin: 14.7 g/dL (ref 12.0–15.0)
MCH: 35.2 pg — ABNORMAL HIGH (ref 26.0–34.0)
MCHC: 36.4 g/dL — ABNORMAL HIGH (ref 30.0–36.0)
MCV: 96.7 fL (ref 78.0–100.0)
Platelets: 217 10*3/uL (ref 150–400)
RBC: 4.18 MIL/uL (ref 3.87–5.11)
RDW: 13.2 % (ref 11.5–15.5)
WBC: 13.1 10*3/uL — ABNORMAL HIGH (ref 4.0–10.5)

## 2010-10-22 ENCOUNTER — Encounter (HOSPITAL_COMMUNITY): Payer: Self-pay

## 2010-10-22 ENCOUNTER — Emergency Department (HOSPITAL_COMMUNITY): Payer: Medicaid Other

## 2010-10-22 LAB — COMPREHENSIVE METABOLIC PANEL
ALT: 13 U/L (ref 0–35)
ALT: 16 U/L (ref 0–35)
ALT: 17 U/L (ref 0–35)
AST: 26 U/L (ref 0–37)
AST: 31 U/L (ref 0–37)
AST: 46 U/L — ABNORMAL HIGH (ref 0–37)
Albumin: 2.7 g/dL — ABNORMAL LOW (ref 3.5–5.2)
Albumin: 2.8 g/dL — ABNORMAL LOW (ref 3.5–5.2)
Albumin: 2.9 g/dL — ABNORMAL LOW (ref 3.5–5.2)
Alkaline Phosphatase: 71 U/L (ref 39–117)
Alkaline Phosphatase: 73 U/L (ref 39–117)
Alkaline Phosphatase: 76 U/L (ref 39–117)
BUN: 2 mg/dL — ABNORMAL LOW (ref 6–23)
BUN: 4 mg/dL — ABNORMAL LOW (ref 6–23)
BUN: 8 mg/dL (ref 6–23)
CO2: 20 mEq/L (ref 19–32)
CO2: 21 mEq/L (ref 19–32)
CO2: 22 mEq/L (ref 19–32)
Calcium: 5.4 mg/dL — CL (ref 8.4–10.5)
Calcium: 5.8 mg/dL — CL (ref 8.4–10.5)
Calcium: 6.6 mg/dL — ABNORMAL LOW (ref 8.4–10.5)
Chloride: 107 mEq/L (ref 96–112)
Chloride: 108 mEq/L (ref 96–112)
Chloride: 109 mEq/L (ref 96–112)
Creatinine, Ser: 0.6 mg/dL (ref 0.4–1.2)
Creatinine, Ser: 0.68 mg/dL (ref 0.4–1.2)
Creatinine, Ser: 0.81 mg/dL (ref 0.4–1.2)
GFR calc Af Amer: >60 mL/min (ref >60)
GFR calc Af Amer: >60 mL/min (ref >60)
GFR calc Af Amer: >60 mL/min (ref >60)
GFR calc non Af Amer: >60 mL/min (ref >60)
GFR calc non Af Amer: >60 mL/min (ref >60)
GFR calc non Af Amer: >60 mL/min (ref >60)
Glucose, Bld: 110 mg/dL — ABNORMAL HIGH (ref 70–99)
Glucose, Bld: 122 mg/dL — ABNORMAL HIGH (ref 70–99)
Glucose, Bld: 131 mg/dL — ABNORMAL HIGH (ref 70–99)
Potassium: 2.2 mEq/L — CL (ref 3.5–5.1)
Potassium: 2.3 mEq/L — CL (ref 3.5–5.1)
Potassium: 2.5 mEq/L — CL (ref 3.5–5.1)
Sodium: 138 mEq/L (ref 135–145)
Sodium: 139 mEq/L (ref 135–145)
Sodium: 140 mEq/L (ref 135–145)
Total Bilirubin: 0.5 mg/dL (ref 0.3–1.2)
Total Bilirubin: 0.6 mg/dL (ref 0.3–1.2)
Total Bilirubin: 0.9 mg/dL (ref 0.3–1.2)
Total Protein: 4.9 g/dL — ABNORMAL LOW (ref 6.0–8.3)
Total Protein: 5.5 g/dL — ABNORMAL LOW (ref 6.0–8.3)
Total Protein: 5.5 g/dL — ABNORMAL LOW (ref 6.0–8.3)

## 2010-10-22 LAB — BASIC METABOLIC PANEL
BUN: 1 mg/dL — ABNORMAL LOW (ref 6–23)
CO2: 25 mEq/L (ref 19–32)
Calcium: 6.6 mg/dL — ABNORMAL LOW (ref 8.4–10.5)
Chloride: 109 mEq/L (ref 96–112)
Creatinine, Ser: 0.65 mg/dL (ref 0.4–1.2)
GFR calc Af Amer: >60 mL/min (ref >60)
GFR calc non Af Amer: >60 mL/min (ref >60)
Glucose, Bld: 112 mg/dL — ABNORMAL HIGH (ref 70–99)
Potassium: 3.6 mEq/L (ref 3.5–5.1)
Sodium: 139 mEq/L (ref 135–145)

## 2010-10-22 LAB — CBC
HCT: 39 % (ref 36.0–46.0)
Hemoglobin: 13.5 g/dL (ref 12.0–15.0)
MCH: 33.8 pg (ref 26.0–34.0)
MCHC: 34.6 g/dL (ref 30.0–36.0)
MCV: 97.7 fL (ref 78.0–100.0)
Platelets: 212 10*3/uL (ref 150–400)
RBC: 3.99 MIL/uL (ref 3.87–5.11)
RDW: 13.4 % (ref 11.5–15.5)
WBC: 11.4 10*3/uL — ABNORMAL HIGH (ref 4.0–10.5)

## 2010-10-22 LAB — URINALYSIS, ROUTINE W REFLEX MICROSCOPIC
Glucose, UA: NEGATIVE mg/dL
Leukocytes, UA: NEGATIVE
Nitrite: NEGATIVE
Protein, ur: 30 mg/dL — AB
Specific Gravity, Urine: 1.018 (ref 1.005–1.030)
Urobilinogen, UA: 0.2 mg/dL (ref 0.0–1.0)
pH: 6.5 (ref 5.0–8.0)

## 2010-10-22 LAB — TSH: TSH: 1.476 u[IU]/mL (ref 0.350–4.500)

## 2010-10-22 LAB — SEDIMENTATION RATE: Sed Rate: 0 mm/hr (ref 0–22)

## 2010-10-22 LAB — URINE MICROSCOPIC-ADD ON

## 2010-10-22 LAB — MAGNESIUM
Magnesium: 0.1 mg/dL — CL (ref 1.5–2.5)
Magnesium: 2.5 mg/dL (ref 1.5–2.5)

## 2010-10-22 LAB — LIPASE, BLOOD: Lipase: 17 U/L (ref 11–59)

## 2010-10-22 LAB — CLOSTRIDIUM DIFFICILE BY PCR: Toxigenic C. Difficile by PCR: NEGATIVE

## 2010-10-22 LAB — PHOSPHORUS: Phosphorus: 4.2 mg/dL (ref 2.3–4.6)

## 2010-10-22 LAB — VITAMIN D 25 HYDROXY (VIT D DEFICIENCY, FRACTURES): Vit D, 25-Hydroxy: 26 ng/mL — ABNORMAL LOW (ref 30–89)

## 2010-10-22 LAB — LACTIC ACID, PLASMA: Lactic Acid, Venous: 1.1 mmol/L (ref 0.5–2.2)

## 2010-10-22 LAB — POCT PREGNANCY, URINE: Preg Test, Ur: NEGATIVE

## 2010-10-22 IMAGING — CT CT ABD-PELV W/ CM
2 of 4 series · 16 of 46 positions shown, 18 images · IV contrast (omnipaque)
Comparison: CT of the abdomen and pelvis performed [DATE]

CLINICAL DATA: Abdominal pain and cramping; nausea, vomiting and
diarrhea.  History of Crohn's disease.  Leukocytosis.

CT ABDOMEN AND PELVIS WITH CONTRAST
TECHNIQUE: Multidetector CT imaging of the abdomen and pelvis was
performed following the standard protocol during bolus
administration of intravenous contrast.
Contrast: 100 mL of Omnipaque 300 IV contrast

[Series 2: rtn a/p with · axial · 0.67mm/px · z∈[+869,+1294]mm · 13 of 93 slices shown, 15 images]
[im 4/93  soft-tissue]
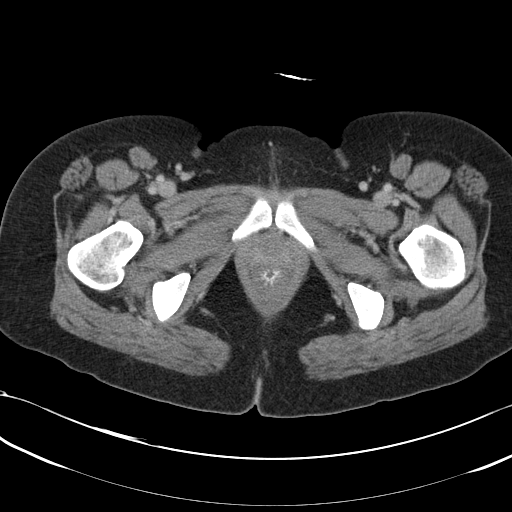
[im 4/93  bone]
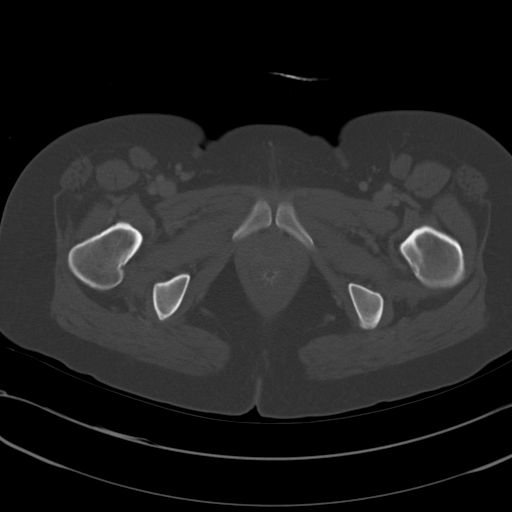
[im 11/93  soft-tissue]
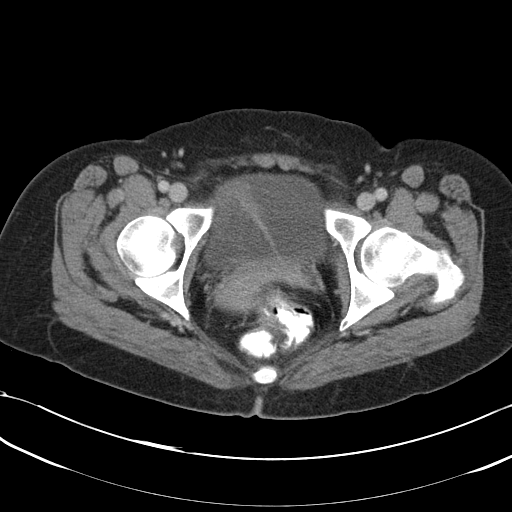
[im 18/93  soft-tissue]
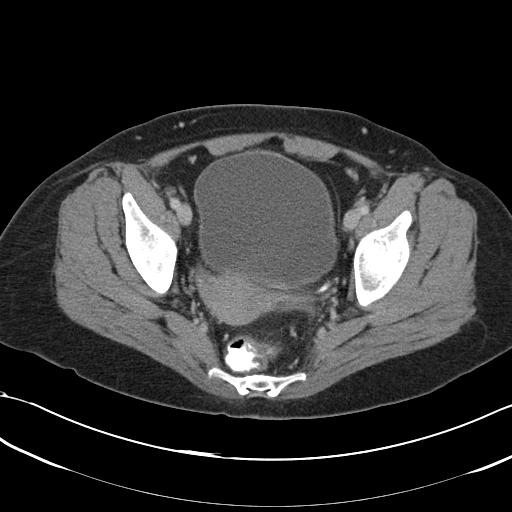
[im 25/93  soft-tissue]
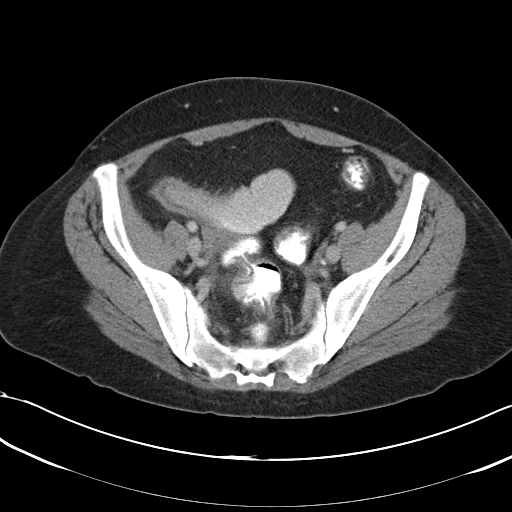
[im 32/93  soft-tissue]
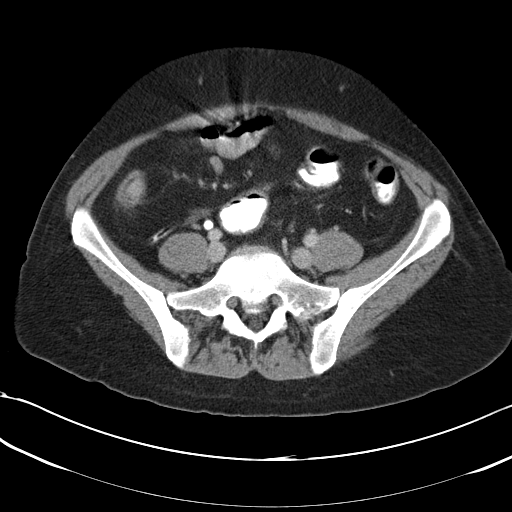
[im 39/93  soft-tissue]
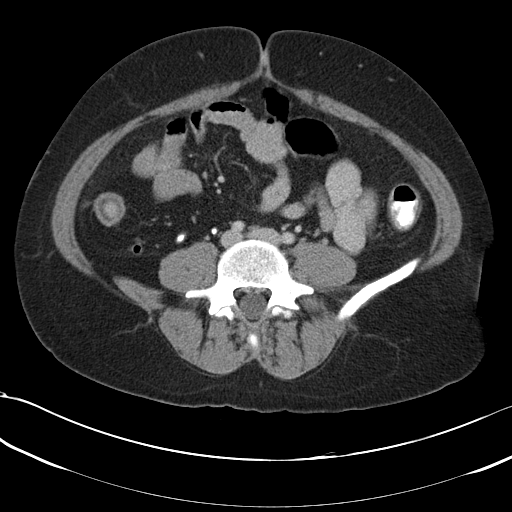
[im 47/93  soft-tissue]
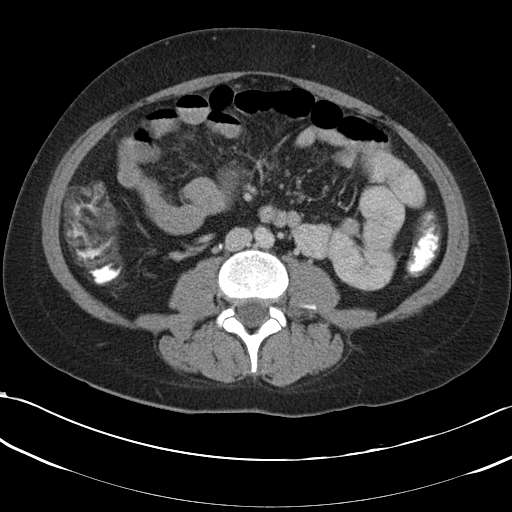
[im 54/93  soft-tissue]
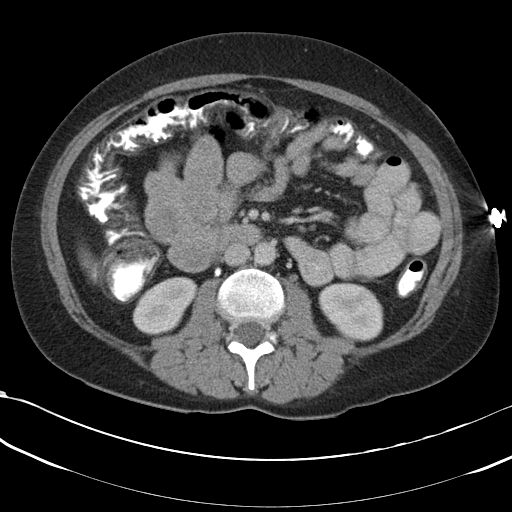
[im 61/93  soft-tissue]
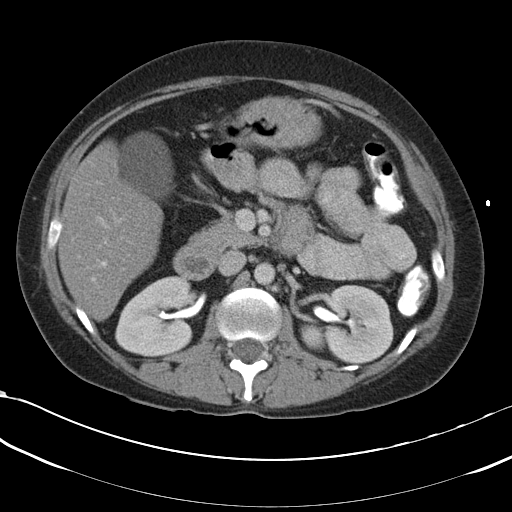
[im 61/93  bone]
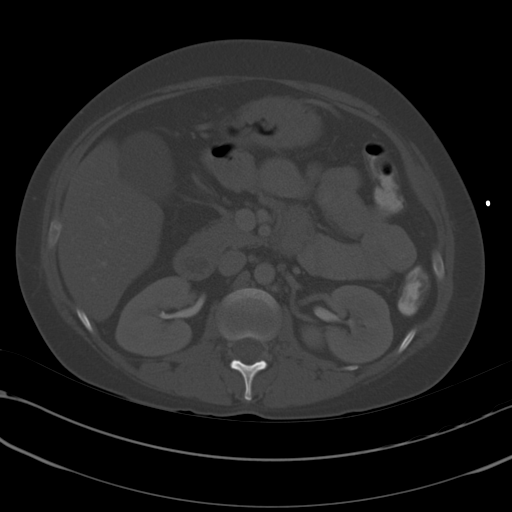
[im 68/93  soft-tissue]
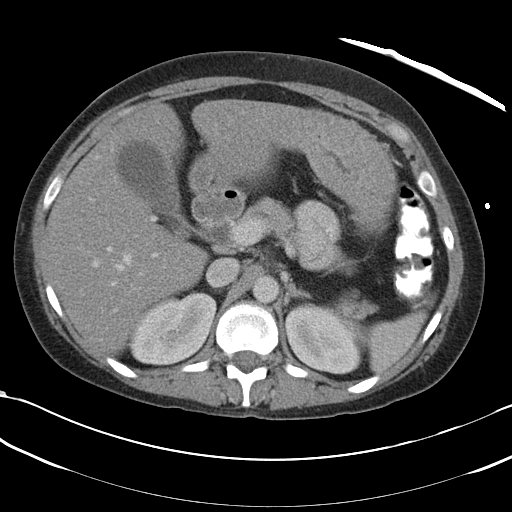
[im 75/93  soft-tissue]
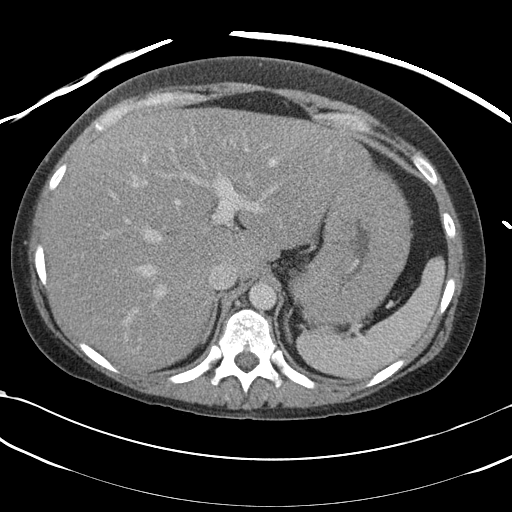
[im 82/93  soft-tissue]
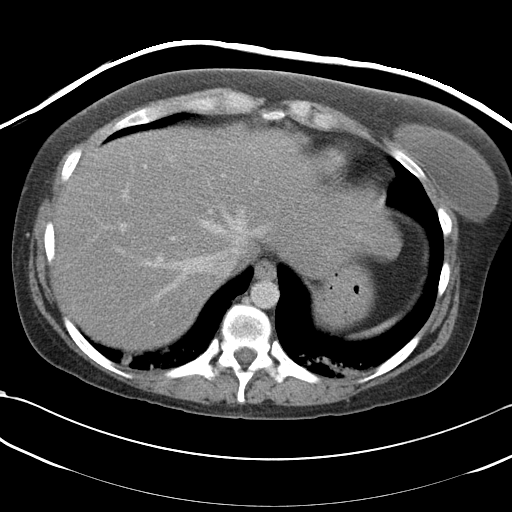
[im 89/93  soft-tissue]
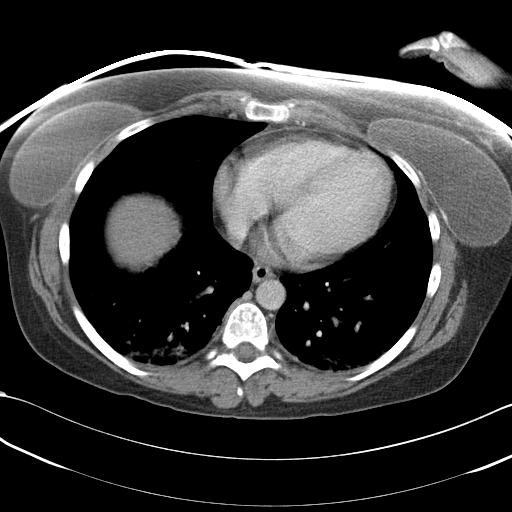

[Series 602: <mpr thick range> · coronal · 0.91mm/px · 3 of 72 slices shown]
[im 24/72  soft-tissue]
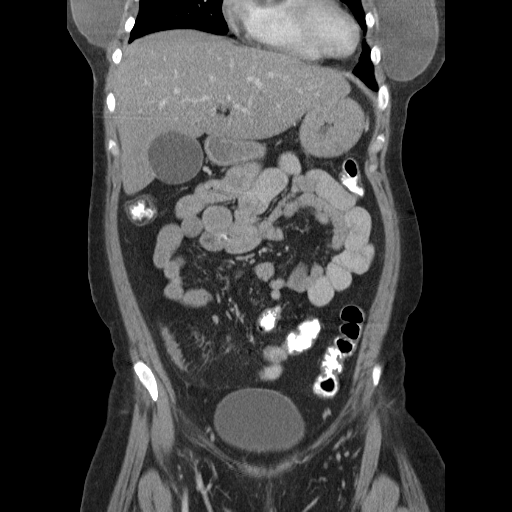
[im 32/72  soft-tissue]
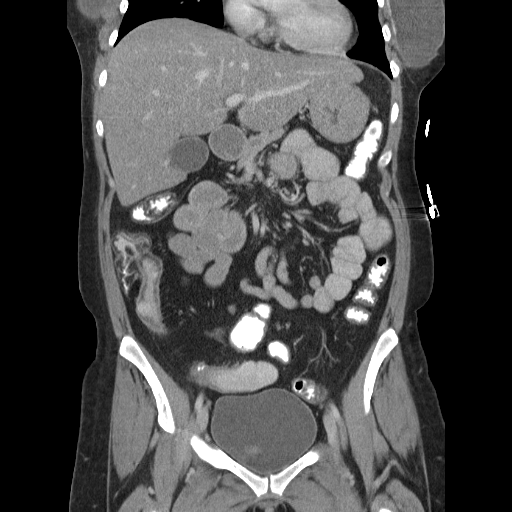
[im 40/72  soft-tissue]
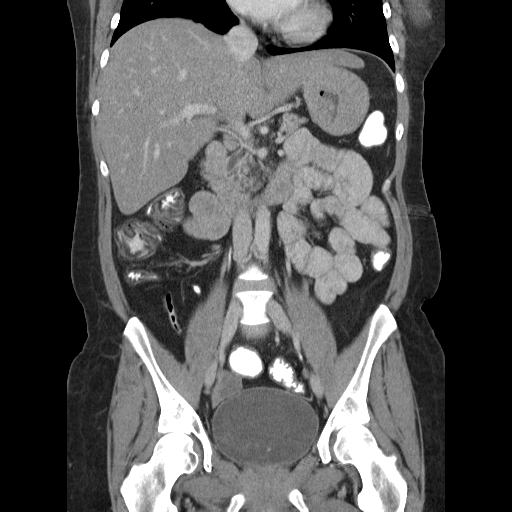

[16 of 46 positions shown; findings below may reference images not displayed]

FINDINGS: Bibasilar atelectasis is noted.  Bilateral saline breast
implants are grossly unremarkable in appearance.

The liver and spleen are unremarkable in appearance.  A small
amount of fatty infiltration is noted adjacent to the falciform
ligament.  The gallbladder is within normal limits.  The pancreas
and adrenal glands are unremarkable.

Contrast is noted within the renal calyces, reflecting an earlier
contrast bolus. Minimal scarring is noted at the lower poles of
both kidneys; the kidneys are otherwise unremarkable in appearance.
Evaluation for stones is suboptimal given contrast within the renal
calyces.  There is no evidence of hydronephrosis.  No significant
perinephric stranding is seen.

There is thickening of the wall of the terminal ileum, involving a
16 cm segment of terminal ileum, with surrounding soft tissue
stranding and mildly decreased luminal diameter.  This is
compatible with the patient's history of Crohn's disease.  No
significant free fluid is seen. The remainder of the small bowel is
unremarkable in appearance.  The stomach is grossly unremarkable.
No acute vascular abnormalities are seen.

The appendix is normal in caliber and contains air and contrast,
without evidence for appendicitis.  There is diffuse fatty
infiltration within the wall of the colon, particularly prominent
at the cecum, likely reflecting underlying chronic inflammation.

The bladder is grossly unremarkable in appearance; a left-sided
ureteral jet is incidentally noted.  The uterus is unremarkable in
appearance; the ovaries are relatively symmetric.  No suspicious
adnexal masses are seen.  No inguinal lymphadenopathy is seen.

No acute osseous abnormalities are identified.  There is mild
chronic loss of height at vertebral body T12.
IMPRESSION: 1.  Wall thickening involving a 16 cm segment of terminal ileum,
with surrounding soft tissue stranding and mildly decreased luminal
diameter, compatible with the patient's history of Crohn's disease.
No significant free fluid seen.
2.  Diffuse fatty infiltration within the wall of the colon,
particularly at the cecum, likely reflects chronic underlying
inflammation.
3.  Bibasilar atelectasis noted.
4.  Minimal scarring at the lower poles of both kidneys.
5.  Mild chronic loss of height of vertebral body T12.

## 2010-10-22 IMAGING — CR DG ABDOMEN ACUTE W/ 1V CHEST
1 series · 1 of 1 positions shown · non-contrast
Comparison: [DATE]

CLINICAL DATA: Abdominal pain vomiting and diarrhea.  History of
Crohn disease.

ACUTE ABDOMEN SERIES (ABDOMEN 2 VIEW & CHEST 1 VIEW)

[t chest supine]
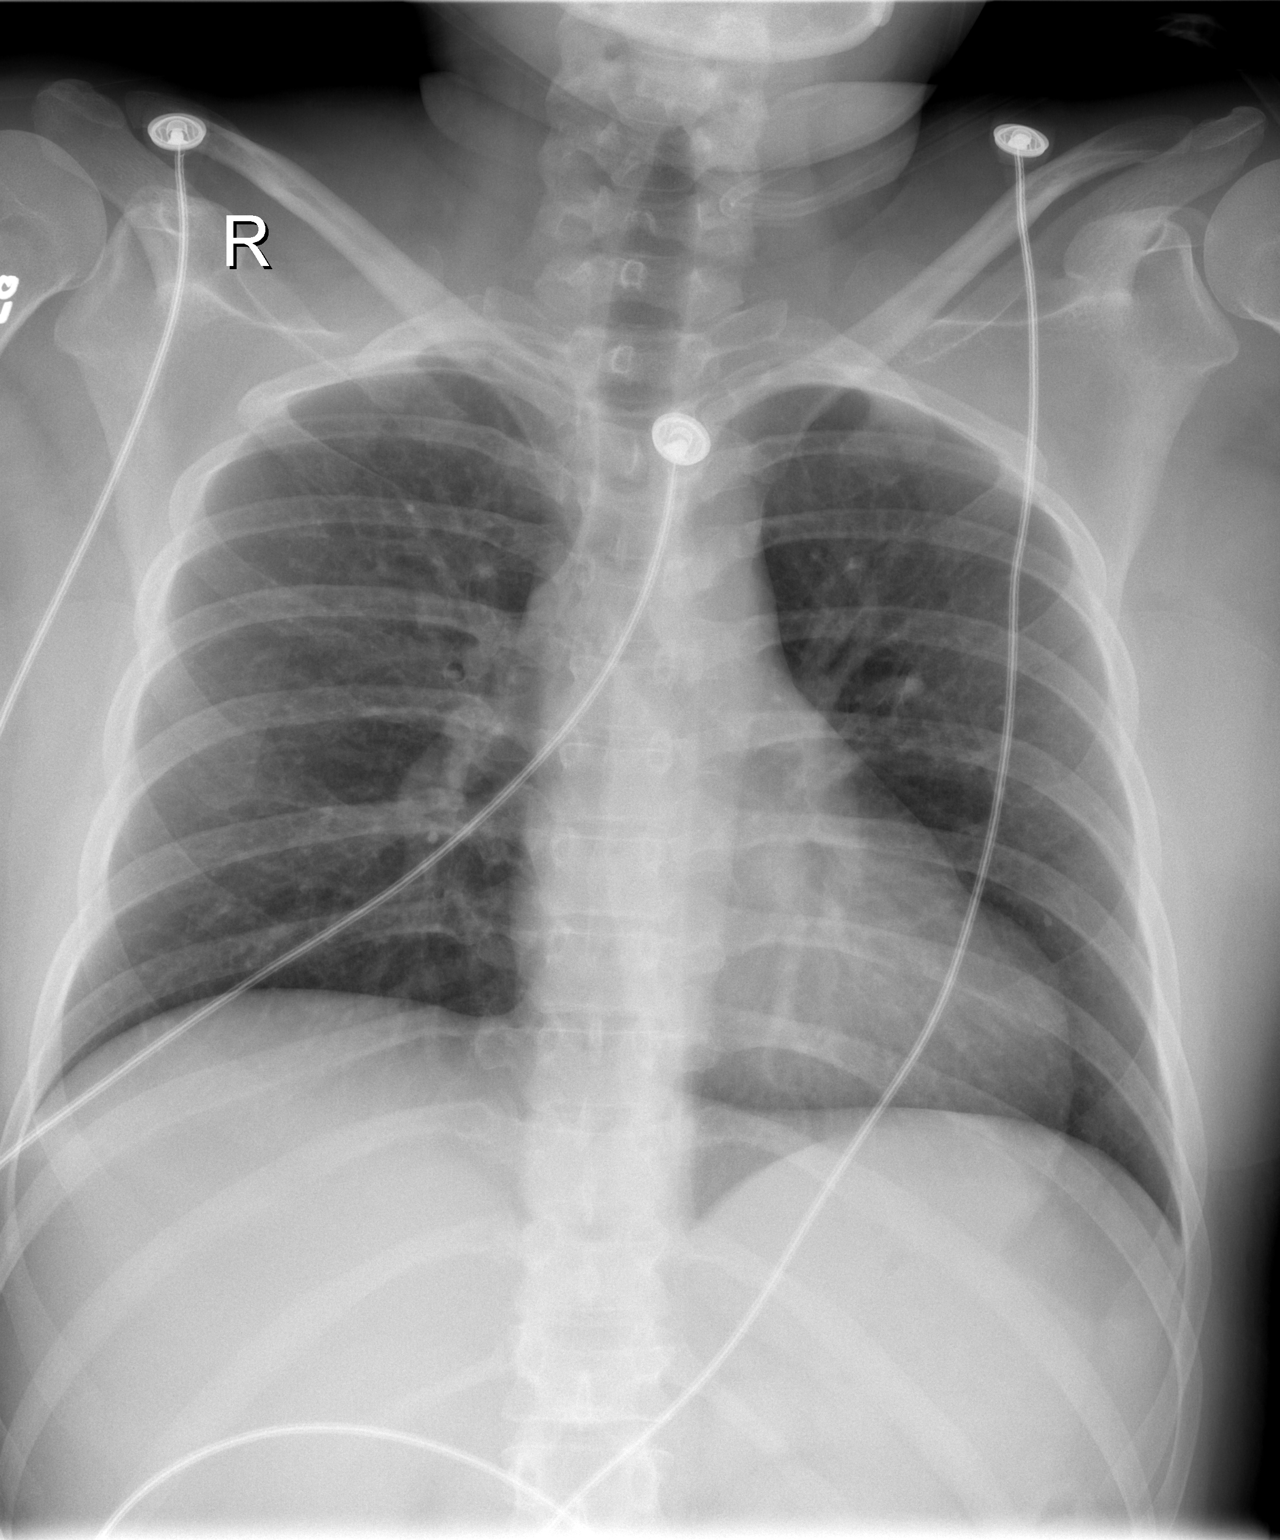

[1 of 1 positions shown; findings below may reference images not displayed]

FINDINGS: Chest view demonstrates normal heart size and pulmonary
vascularity.  No focal airspace disease or consolidation.  No
blunting of costophrenic angles.

Abdominal views demonstrate no small or large bowel distension.
Curvilinear lucency in the right lower quadrant appears stable
since prior study.  Small right upper quadrant calcification could
represent a gallstone.  No free air.
IMPRESSION: No evidence of active pulmonary disease.  Nonobstructive bowel gas
pattern.  No change since previous study.

## 2010-10-22 IMAGING — CR DG ABDOMEN ACUTE W/ 1V CHEST
3 series · 3 of 3 positions shown · non-contrast
Comparison: [DATE]

CLINICAL DATA: Abdominal pain vomiting and diarrhea.  History of
Crohn disease.

ACUTE ABDOMEN SERIES (ABDOMEN 2 VIEW & CHEST 1 VIEW)

[w abdomen decub * (1 of 2)]
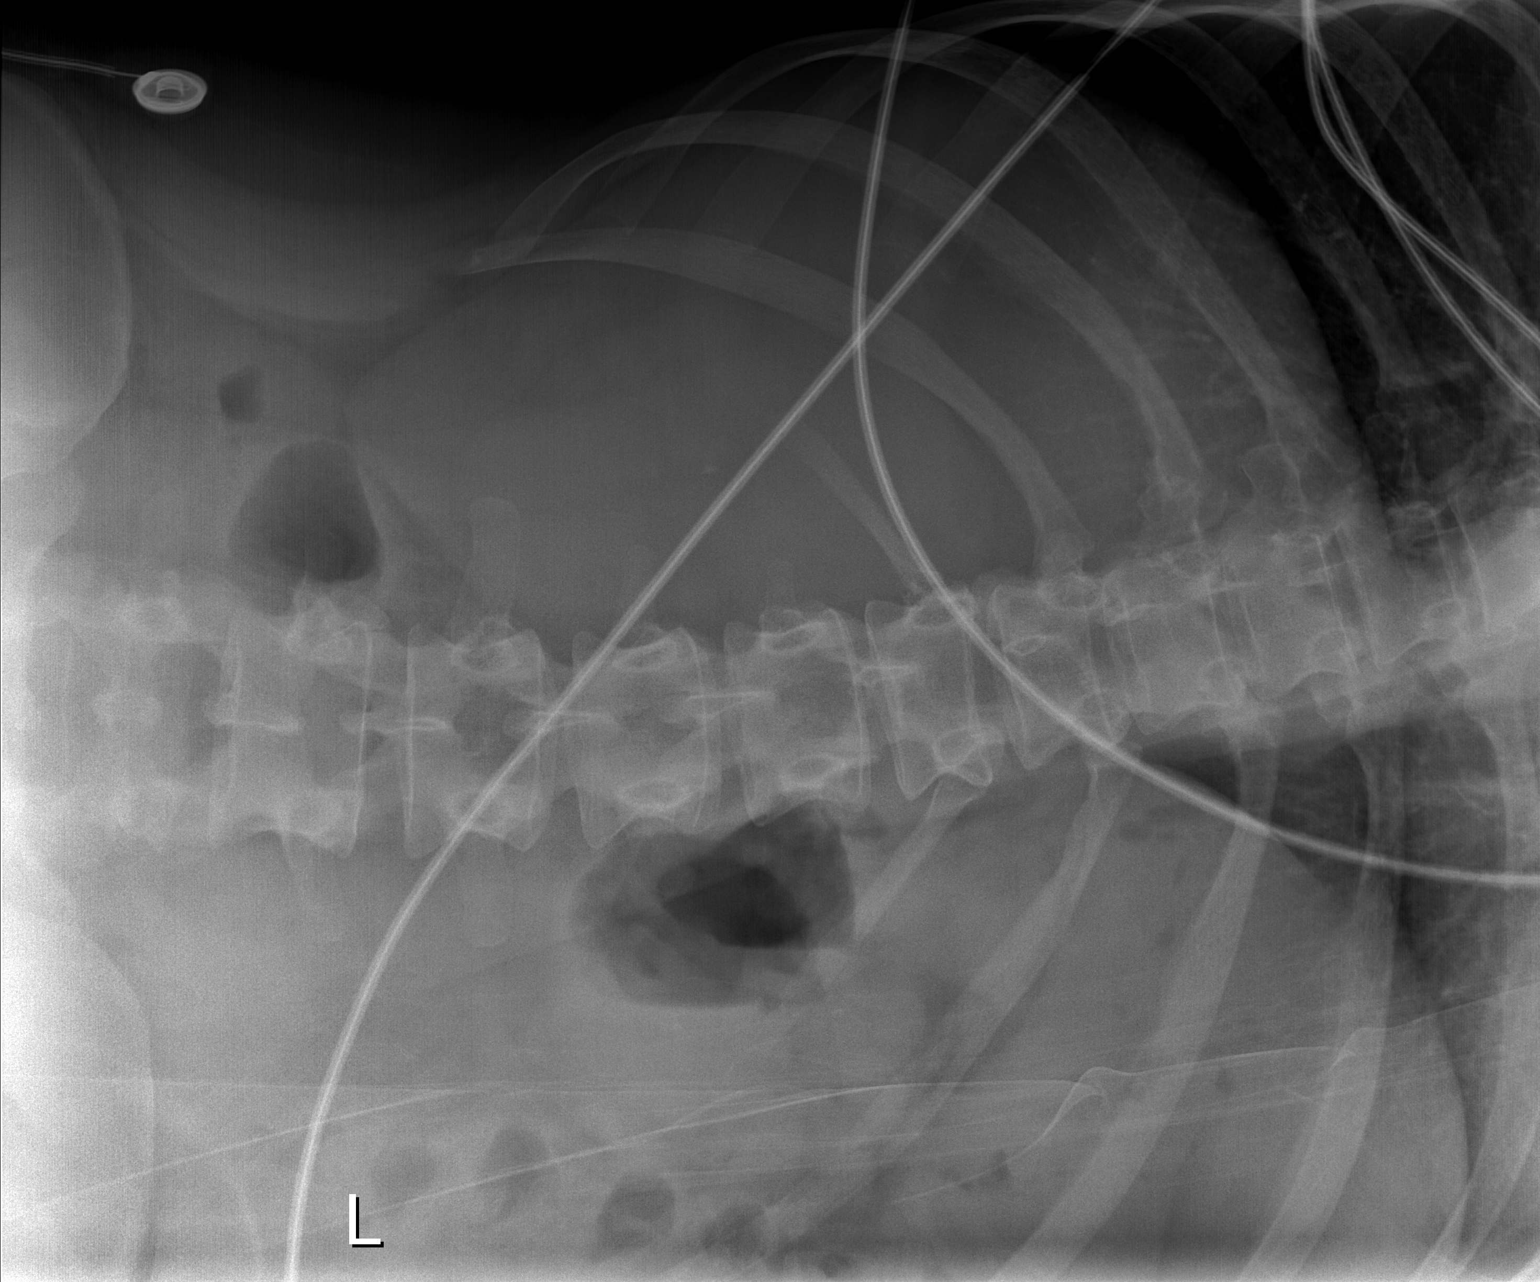

[w abdomen decub * (2 of 2)]
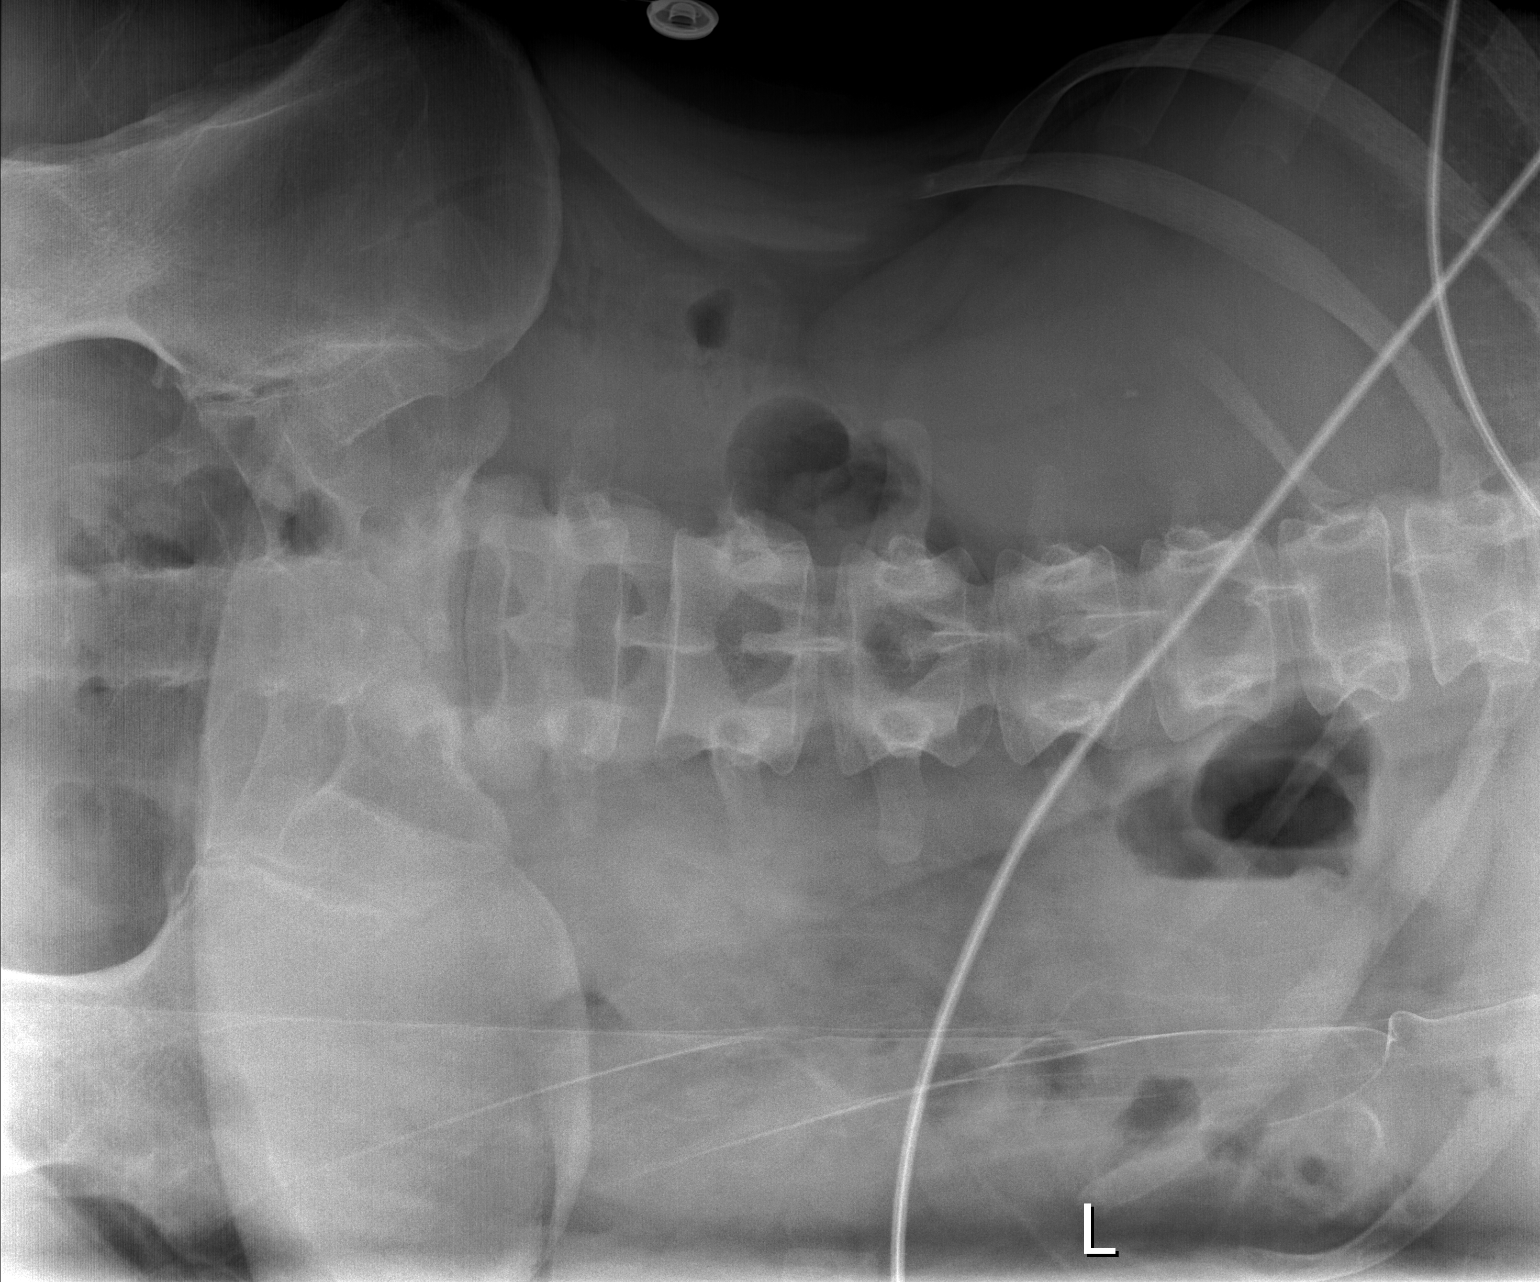

[t abdomen supine]
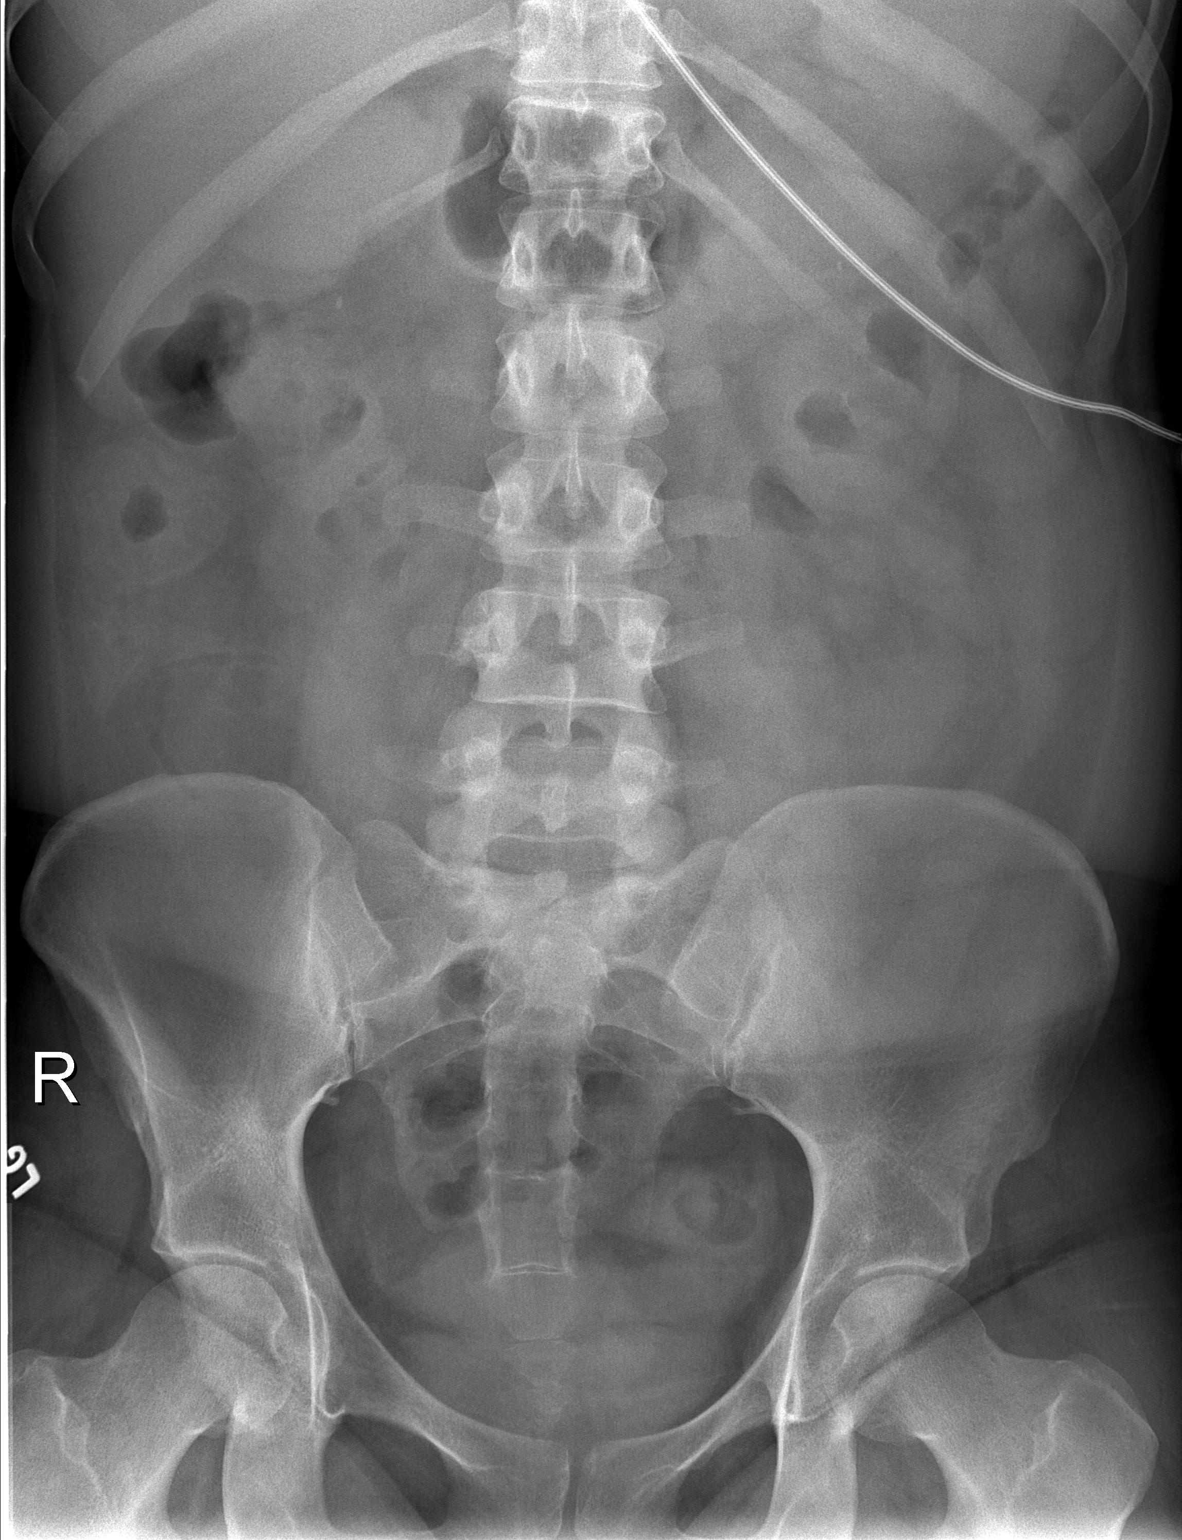

[3 of 3 positions shown; findings below may reference images not displayed]

FINDINGS: Chest view demonstrates normal heart size and pulmonary
vascularity.  No focal airspace disease or consolidation.  No
blunting of costophrenic angles.

Abdominal views demonstrate no small or large bowel distension.
Curvilinear lucency in the right lower quadrant appears stable
since prior study.  Small right upper quadrant calcification could
represent a gallstone.  No free air.
IMPRESSION: No evidence of active pulmonary disease.  Nonobstructive bowel gas
pattern.  No change since previous study.

## 2010-10-22 MED ORDER — IOHEXOL 300 MG/ML  SOLN
100.0000 mL | Freq: Once | INTRAMUSCULAR | Status: AC | PRN
  Administered 2010-10-22: 100 mL via INTRAVENOUS

## 2010-10-23 LAB — CBC
HCT: 39.3 % (ref 36.0–46.0)
Hemoglobin: 13.7 g/dL (ref 12.0–15.0)
MCH: 35.1 pg — ABNORMAL HIGH (ref 26.0–34.0)
MCHC: 34.9 g/dL (ref 30.0–36.0)
MCV: 100.8 fL — ABNORMAL HIGH (ref 78.0–100.0)
Platelets: 192 10*3/uL (ref 150–400)
RBC: 3.9 MIL/uL (ref 3.87–5.11)
RDW: 13.5 % (ref 11.5–15.5)
WBC: 8.5 10*3/uL (ref 4.0–10.5)

## 2010-10-23 LAB — CARDIAC PANEL(CRET KIN+CKTOT+MB+TROPI)
CK, MB: 2.4 ng/mL (ref 0.3–4.0)
CK, MB: 2.2 ng/mL (ref 0.3–4.0)
CK, MB: 2.6 ng/mL (ref 0.3–4.0)
Relative Index: 1.6 (ref 0.0–2.5)
Relative Index: 1.8 (ref 0.0–2.5)
Relative Index: 1.9 (ref 0.0–2.5)
Total CK: 148 U/L (ref 7–177)
Total CK: 123 U/L (ref 7–177)
Total CK: 134 U/L (ref 7–177)
Troponin I: 0.01 ng/mL (ref 0.00–0.06)
Troponin I: 0.01 ng/mL (ref 0.00–0.06)
Troponin I: 0.01 ng/mL (ref 0.00–0.06)

## 2010-10-23 LAB — COMPREHENSIVE METABOLIC PANEL
ALT: 15 U/L (ref 0–35)
AST: 31 U/L (ref 0–37)
Albumin: 2.8 g/dL — ABNORMAL LOW (ref 3.5–5.2)
Alkaline Phosphatase: 86 U/L (ref 39–117)
BUN: 2 mg/dL — ABNORMAL LOW (ref 6–23)
CO2: 21 mEq/L (ref 19–32)
Calcium: 6.4 mg/dL — CL (ref 8.4–10.5)
Chloride: 106 mEq/L (ref 96–112)
Creatinine, Ser: 0.54 mg/dL (ref 0.4–1.2)
GFR calc Af Amer: >60 mL/min (ref >60)
GFR calc non Af Amer: >60 mL/min (ref >60)
Glucose, Bld: 126 mg/dL — ABNORMAL HIGH (ref 70–99)
Potassium: 3.7 mEq/L (ref 3.5–5.1)
Sodium: 135 mEq/L (ref 135–145)
Total Bilirubin: 0.5 mg/dL (ref 0.3–1.2)
Total Protein: 5.2 g/dL — ABNORMAL LOW (ref 6.0–8.3)

## 2010-10-23 LAB — CALCIUM: Calcium: 6.8 mg/dL — ABNORMAL LOW (ref 8.4–10.5)

## 2010-10-24 LAB — CBC
HCT: 33.8 % — ABNORMAL LOW (ref 36.0–46.0)
HCT: 43.7 % (ref 36.0–46.0)
HCT: 48 % — ABNORMAL HIGH (ref 36.0–46.0)
Hemoglobin: 11.6 g/dL — ABNORMAL LOW (ref 12.0–15.0)
Hemoglobin: 15 g/dL (ref 12.0–15.0)
Hemoglobin: 16.4 g/dL — ABNORMAL HIGH (ref 12.0–15.0)
MCH: 33.3 pg (ref 26.0–34.0)
MCH: 33.5 pg (ref 26.0–34.0)
MCH: 33.6 pg (ref 26.0–34.0)
MCHC: 34.1 g/dL (ref 30.0–36.0)
MCHC: 34.3 g/dL (ref 30.0–36.0)
MCHC: 34.4 g/dL (ref 30.0–36.0)
MCV: 97.6 fL (ref 78.0–100.0)
MCV: 97.9 fL (ref 78.0–100.0)
MCV: 98.1 fL (ref 78.0–100.0)
Platelets: 220 10*3/uL (ref 150–400)
Platelets: 245 10*3/uL (ref 150–400)
Platelets: 333 10*3/uL (ref 150–400)
RBC: 3.45 MIL/uL — ABNORMAL LOW (ref 3.87–5.11)
RBC: 4.47 MIL/uL (ref 3.87–5.11)
RBC: 4.91 MIL/uL (ref 3.87–5.11)
RDW: 14.6 % (ref 11.5–15.5)
RDW: 14.8 % (ref 11.5–15.5)
RDW: 14.9 % (ref 11.5–15.5)
WBC: 12.1 10*3/uL — ABNORMAL HIGH (ref 4.0–10.5)
WBC: 13.3 10*3/uL — ABNORMAL HIGH (ref 4.0–10.5)
WBC: 14.5 10*3/uL — ABNORMAL HIGH (ref 4.0–10.5)

## 2010-10-24 LAB — DIFFERENTIAL
Basophils Absolute: 0 10*3/uL (ref 0.0–0.1)
Basophils Absolute: 0.1 10*3/uL (ref 0.0–0.1)
Basophils Relative: 0 % (ref 0–1)
Basophils Relative: 1 % (ref 0–1)
Eosinophils Absolute: 0.1 10*3/uL (ref 0.0–0.7)
Eosinophils Absolute: 0.2 10*3/uL (ref 0.0–0.7)
Eosinophils Relative: 0 % (ref 0–5)
Eosinophils Relative: 1 % (ref 0–5)
Lymphocytes Relative: 16 % (ref 12–46)
Lymphocytes Relative: 39 % (ref 12–46)
Lymphs Abs: 2.3 10*3/uL (ref 0.7–4.0)
Lymphs Abs: 5.1 10*3/uL — ABNORMAL HIGH (ref 0.7–4.0)
Monocytes Absolute: 0.5 10*3/uL (ref 0.1–1.0)
Monocytes Absolute: 1 10*3/uL (ref 0.1–1.0)
Monocytes Relative: 3 % (ref 3–12)
Monocytes Relative: 8 % (ref 3–12)
Neutro Abs: 11.7 10*3/uL — ABNORMAL HIGH (ref 1.7–7.7)
Neutro Abs: 6.9 10*3/uL (ref 1.7–7.7)
Neutrophils Relative %: 52 % (ref 43–77)
Neutrophils Relative %: 81 % — ABNORMAL HIGH (ref 43–77)

## 2010-10-24 LAB — CULTURE, ROUTINE-ABSCESS: Culture: NO GROWTH

## 2010-10-24 LAB — COMPREHENSIVE METABOLIC PANEL
ALT: 13 U/L (ref 0–35)
AST: 24 U/L (ref 0–37)
Albumin: 2.8 g/dL — ABNORMAL LOW (ref 3.5–5.2)
Alkaline Phosphatase: 81 U/L (ref 39–117)
BUN: <1 mg/dL — ABNORMAL LOW (ref 6–23)
CO2: 22 mEq/L (ref 19–32)
Calcium: 6.1 mg/dL — CL (ref 8.4–10.5)
Chloride: 110 mEq/L (ref 96–112)
Creatinine, Ser: 0.48 mg/dL (ref 0.4–1.2)
GFR calc Af Amer: >60 mL/min (ref >60)
GFR calc non Af Amer: >60 mL/min (ref >60)
Glucose, Bld: 114 mg/dL — ABNORMAL HIGH (ref 70–99)
Potassium: 2.4 mEq/L — CL (ref 3.5–5.1)
Sodium: 143 mEq/L (ref 135–145)
Total Bilirubin: 0.6 mg/dL (ref 0.3–1.2)
Total Protein: 5.8 g/dL — ABNORMAL LOW (ref 6.0–8.3)

## 2010-10-24 LAB — CLOSTRIDIUM DIFFICILE EIA
C difficile Toxins A+B, EIA: NEGATIVE
C difficile Toxins A+B, EIA: NEGATIVE

## 2010-10-24 LAB — CULTURE, BLOOD (ROUTINE X 2)
Culture: NO GROWTH
Culture: NO GROWTH

## 2010-10-24 LAB — BASIC METABOLIC PANEL
BUN: 2 mg/dL — ABNORMAL LOW (ref 6–23)
BUN: <1 mg/dL — ABNORMAL LOW (ref 6–23)
BUN: <1 mg/dL — ABNORMAL LOW (ref 6–23)
CO2: 21 mEq/L (ref 19–32)
CO2: 22 mEq/L (ref 19–32)
CO2: 25 mEq/L (ref 19–32)
Calcium: 5.6 mg/dL — CL (ref 8.4–10.5)
Calcium: 5.9 mg/dL — CL (ref 8.4–10.5)
Calcium: 6.2 mg/dL — CL (ref 8.4–10.5)
Chloride: 108 mEq/L (ref 96–112)
Chloride: 108 mEq/L (ref 96–112)
Chloride: 113 mEq/L — ABNORMAL HIGH (ref 96–112)
Creatinine, Ser: 0.48 mg/dL (ref 0.4–1.2)
Creatinine, Ser: 0.48 mg/dL (ref 0.4–1.2)
Creatinine, Ser: 0.58 mg/dL (ref 0.4–1.2)
GFR calc Af Amer: >60 mL/min (ref >60)
GFR calc Af Amer: >60 mL/min (ref >60)
GFR calc Af Amer: >60 mL/min (ref >60)
GFR calc non Af Amer: >60 mL/min (ref >60)
GFR calc non Af Amer: >60 mL/min (ref >60)
GFR calc non Af Amer: >60 mL/min (ref >60)
Glucose, Bld: 116 mg/dL — ABNORMAL HIGH (ref 70–99)
Glucose, Bld: 130 mg/dL — ABNORMAL HIGH (ref 70–99)
Glucose, Bld: 137 mg/dL — ABNORMAL HIGH (ref 70–99)
Potassium: 2.1 mEq/L — CL (ref 3.5–5.1)
Potassium: 2.2 mEq/L — CL (ref 3.5–5.1)
Potassium: <2 mEq/L — CL (ref 3.5–5.1)
Sodium: 140 mEq/L (ref 135–145)
Sodium: 142 mEq/L (ref 135–145)
Sodium: 144 mEq/L (ref 135–145)

## 2010-10-24 LAB — MAGNESIUM
Magnesium: 1.2 mg/dL — ABNORMAL LOW (ref 1.5–2.5)
Magnesium: 1.9 mg/dL (ref 1.5–2.5)

## 2010-10-24 LAB — URINALYSIS, ROUTINE W REFLEX MICROSCOPIC
Bilirubin Urine: NEGATIVE
Glucose, UA: NEGATIVE mg/dL
Hgb urine dipstick: NEGATIVE
Ketones, ur: 15 mg/dL — AB
Nitrite: NEGATIVE
Protein, ur: NEGATIVE mg/dL
Specific Gravity, Urine: 1.008 (ref 1.005–1.030)
Urobilinogen, UA: 1 mg/dL (ref 0.0–1.0)
pH: 7 (ref 5.0–8.0)

## 2010-10-24 LAB — CORTISOL: Cortisol, Plasma: 17.2 ug/dL

## 2010-10-24 LAB — POTASSIUM: Potassium: 2.2 mEq/L — CL (ref 3.5–5.1)

## 2010-10-24 LAB — POCT PREGNANCY, URINE: Preg Test, Ur: NEGATIVE

## 2010-10-25 ENCOUNTER — Telehealth: Payer: Self-pay | Admitting: Internal Medicine

## 2010-10-25 DIAGNOSIS — K5080 Crohn's disease of both small and large intestine without complications: Secondary | ICD-10-CM

## 2010-10-25 DIAGNOSIS — R933 Abnormal findings on diagnostic imaging of other parts of digestive tract: Secondary | ICD-10-CM

## 2010-10-25 DIAGNOSIS — R1031 Right lower quadrant pain: Secondary | ICD-10-CM

## 2010-10-25 LAB — CBC
HCT: 39.2 % (ref 36.0–46.0)
Hemoglobin: 13.2 g/dL (ref 12.0–15.0)
MCH: 34 pg (ref 26.0–34.0)
MCHC: 33.7 g/dL (ref 30.0–36.0)
MCV: 101 fL — ABNORMAL HIGH (ref 78.0–100.0)
Platelets: 280 10*3/uL (ref 150–400)
RBC: 3.88 MIL/uL (ref 3.87–5.11)
RDW: 13 % (ref 11.5–15.5)
WBC: 17.3 10*3/uL — ABNORMAL HIGH (ref 4.0–10.5)

## 2010-10-25 LAB — HEMOGLOBIN A1C
Hgb A1c MFr Bld: 5.7 % — ABNORMAL HIGH (ref <5.7)
Mean Plasma Glucose: 117 mg/dL — ABNORMAL HIGH (ref <117)

## 2010-10-25 LAB — DIFFERENTIAL
Basophils Absolute: 0 10*3/uL (ref 0.0–0.1)
Basophils Relative: 0 % (ref 0–1)
Eosinophils Absolute: 0 10*3/uL (ref 0.0–0.7)
Eosinophils Relative: 0 % (ref 0–5)
Lymphocytes Relative: 5 % — ABNORMAL LOW (ref 12–46)
Lymphs Abs: 0.8 10*3/uL (ref 0.7–4.0)
Monocytes Absolute: 0.4 10*3/uL (ref 0.1–1.0)
Monocytes Relative: 2 % — ABNORMAL LOW (ref 3–12)
Neutro Abs: 16.1 10*3/uL — ABNORMAL HIGH (ref 1.7–7.7)
Neutrophils Relative %: 93 % — ABNORMAL HIGH (ref 43–77)

## 2010-10-25 LAB — GLIADIN ANTIBODIES, SERUM
Gliadin IgA: 8.5 U/mL (ref <20)
Gliadin IgG: 2.2 U/mL (ref <20)

## 2010-10-25 LAB — BASIC METABOLIC PANEL
BUN: 6 mg/dL (ref 6–23)
CO2: 25 mEq/L (ref 19–32)
Calcium: 7.5 mg/dL — ABNORMAL LOW (ref 8.4–10.5)
Chloride: 104 mEq/L (ref 96–112)
Creatinine, Ser: 0.64 mg/dL (ref 0.4–1.2)
GFR calc Af Amer: >60 mL/min (ref >60)
GFR calc non Af Amer: >60 mL/min (ref >60)
Glucose, Bld: 240 mg/dL — ABNORMAL HIGH (ref 70–99)
Potassium: 4 mEq/L (ref 3.5–5.1)
Sodium: 136 mEq/L (ref 135–145)

## 2010-10-25 LAB — PTH, INTACT AND CALCIUM
Calcium, Total (PTH): 5.9 mg/dL — CL (ref 8.4–10.5)
PTH: 45.7 pg/mL (ref 14.0–72.0)

## 2010-10-25 LAB — GLUCOSE, CAPILLARY: Glucose-Capillary: 180 mg/dL — ABNORMAL HIGH (ref 70–99)

## 2010-10-25 LAB — TISSUE TRANSGLUTAMINASE, IGA: Tissue Transglutaminase Ab, IgA: 5.1 U/mL (ref <20)

## 2010-10-26 ENCOUNTER — Telehealth: Payer: Self-pay | Admitting: Gastroenterology

## 2010-10-26 DIAGNOSIS — R933 Abnormal findings on diagnostic imaging of other parts of digestive tract: Secondary | ICD-10-CM

## 2010-10-26 DIAGNOSIS — R1031 Right lower quadrant pain: Secondary | ICD-10-CM

## 2010-10-26 DIAGNOSIS — K5080 Crohn's disease of both small and large intestine without complications: Secondary | ICD-10-CM

## 2010-10-26 LAB — URINALYSIS, ROUTINE W REFLEX MICROSCOPIC
Bilirubin Urine: NEGATIVE
Glucose, UA: NEGATIVE mg/dL
Hgb urine dipstick: NEGATIVE
Ketones, ur: NEGATIVE mg/dL
Nitrite: NEGATIVE
Protein, ur: NEGATIVE mg/dL
Specific Gravity, Urine: 1.013 (ref 1.005–1.030)
Urobilinogen, UA: 1 mg/dL (ref 0.0–1.0)
pH: 7 (ref 5.0–8.0)

## 2010-10-26 LAB — COMPREHENSIVE METABOLIC PANEL
ALT: 20 U/L (ref 0–35)
AST: 22 U/L (ref 0–37)
Albumin: 2.9 g/dL — ABNORMAL LOW (ref 3.5–5.2)
Alkaline Phosphatase: 90 U/L (ref 39–117)
BUN: 7 mg/dL (ref 6–23)
CO2: 26 mEq/L (ref 19–32)
Calcium: 8.7 mg/dL (ref 8.4–10.5)
Chloride: 105 mEq/L (ref 96–112)
Creatinine, Ser: 0.63 mg/dL (ref 0.4–1.2)
GFR calc Af Amer: >60 mL/min (ref >60)
GFR calc non Af Amer: >60 mL/min (ref >60)
Glucose, Bld: 175 mg/dL — ABNORMAL HIGH (ref 70–99)
Potassium: 3.4 mEq/L — ABNORMAL LOW (ref 3.5–5.1)
Sodium: 138 mEq/L (ref 135–145)
Total Bilirubin: 0.3 mg/dL (ref 0.3–1.2)
Total Protein: 5.6 g/dL — ABNORMAL LOW (ref 6.0–8.3)

## 2010-10-26 LAB — DIFFERENTIAL
Basophils Absolute: 0.2 10*3/uL — ABNORMAL HIGH (ref 0.0–0.1)
Basophils Relative: 2 % — ABNORMAL HIGH (ref 0–1)
Eosinophils Absolute: 0.3 10*3/uL (ref 0.0–0.7)
Eosinophils Relative: 3 % (ref 0–5)
Lymphocytes Relative: 31 % (ref 12–46)
Lymphs Abs: 3.6 10*3/uL (ref 0.7–4.0)
Monocytes Absolute: 0.8 10*3/uL (ref 0.1–1.0)
Monocytes Relative: 7 % (ref 3–12)
Neutro Abs: 6.6 10*3/uL (ref 1.7–7.7)
Neutrophils Relative %: 58 % (ref 43–77)

## 2010-10-26 LAB — CBC
HCT: 39.2 % (ref 36.0–46.0)
HCT: 42.8 % (ref 36.0–46.0)
Hemoglobin: 12.9 g/dL (ref 12.0–15.0)
Hemoglobin: 14.6 g/dL (ref 12.0–15.0)
MCH: 33.5 pg (ref 26.0–34.0)
MCHC: 32.9 g/dL (ref 30.0–36.0)
MCHC: 34 g/dL (ref 30.0–36.0)
MCV: 101.8 fL — ABNORMAL HIGH (ref 78.0–100.0)
MCV: 96.6 fL (ref 78.0–100.0)
Platelets: 304 10*3/uL (ref 150–400)
Platelets: 270 10*3/uL (ref 150–400)
RBC: 3.85 MIL/uL — ABNORMAL LOW (ref 3.87–5.11)
RBC: 4.43 MIL/uL (ref 3.87–5.11)
RDW: 13 % (ref 11.5–15.5)
RDW: 14.2 % (ref 11.5–15.5)
WBC: 24.5 10*3/uL — ABNORMAL HIGH (ref 4.0–10.5)
WBC: 11.5 10*3/uL — ABNORMAL HIGH (ref 4.0–10.5)

## 2010-10-26 LAB — BASIC METABOLIC PANEL
BUN: 3 mg/dL — ABNORMAL LOW (ref 6–23)
CO2: 21 mEq/L (ref 19–32)
Calcium: 6.9 mg/dL — ABNORMAL LOW (ref 8.4–10.5)
Chloride: 103 mEq/L (ref 96–112)
Creatinine, Ser: 0.68 mg/dL (ref 0.4–1.2)
GFR calc Af Amer: >60 mL/min (ref >60)
GFR calc non Af Amer: >60 mL/min (ref >60)
Glucose, Bld: 98 mg/dL (ref 70–99)
Potassium: 2.3 mEq/L — CL (ref 3.5–5.1)
Sodium: 139 mEq/L (ref 135–145)

## 2010-10-26 LAB — GLUCOSE, CAPILLARY
Glucose-Capillary: 145 mg/dL — ABNORMAL HIGH (ref 70–99)
Glucose-Capillary: 115 mg/dL — ABNORMAL HIGH (ref 70–99)

## 2010-10-26 LAB — VITAMIN D 1,25 DIHYDROXY
Vitamin D 1, 25 (OH)2 Total: 39 pg/mL (ref 18–72)
Vitamin D2 1, 25 (OH)2: <8 pg/mL
Vitamin D3 1, 25 (OH)2: 39 pg/mL

## 2010-10-26 LAB — POCT PREGNANCY, URINE: Preg Test, Ur: NEGATIVE

## 2010-10-26 NOTE — Telephone Encounter (Signed)
Aram Beecham working to see if there is a patient assistance but the patient has been noncompliant with her office visits. She has multiple same days cancels and no shows.

## 2010-10-26 NOTE — Telephone Encounter (Signed)
Received a call this am from Willette Cluster, NP from the hospital; Pt admitted for Crohn's flare. Gunnar Fusi wanted to know if we had samples of Entocort and if the drug company has an assistance program. Gunnar Fusi stated Entocort seems to be working for the pt at this point and pt is being discharged today. Gunnar Fusi also would like to see the pt in 2-3 weeks and she may be able to order a cheaper med at that point. [phone] Informed Paula we don't have any samples or discount vouchers.  Entocort has gone generic, but I called [employer] and 60 tabs are >$1000. I called the [employer], Jacqualyn Posey [phone] who referred [place] to Jannette Fogo, another rep, [phone]. She will have someone call me. Entocort was bought by [employer] from [employer] a couple of months ago.

## 2010-10-27 ENCOUNTER — Encounter: Payer: Self-pay | Admitting: Gastroenterology

## 2010-10-27 ENCOUNTER — Telehealth: Payer: Self-pay | Admitting: Gastroenterology

## 2010-10-27 NOTE — Telephone Encounter (Signed)
error 

## 2010-10-28 NOTE — Telephone Encounter (Signed)
LMOM for pt that Willette Cluster, NP had informed me she would need an appt in a couple of weeks. Paula's schedule is not out yet and I will call her when I can schedule.

## 2010-10-29 NOTE — Discharge Summary (Signed)
[name], [name]              ACCOUNT NO.:  [id number]  MEDICAL RECORD NO.:  [id number]           PATIENT TYPE:  I  LOCATION:  1007                         FACILITY:  [facility]  PHYSICIAN:  Annita Brod, M.D.DATE OF BIRTH:  1980-12-04  DATE OF ADMISSION:  10/21/2010 DATE OF DISCHARGE:  10/26/2010                              DISCHARGE SUMMARY   PRIMARY CARE PHYSICIAN:  Biagio Borg, MD  CONSULTANTS:  Milus Banister, MD, [facility].  DISCHARGE DIAGNOSES: 1. Crohn disease present on admission with secondary acute flare-up     and ileitis also present on admission. 2. Electrolyte abnormalities, specifically hypocalcemia,     hypomagnesemia and hypokalemia secondary to malabsorption, brought     on by Crohn disease also present on admission. 3. History of anxiety disorder. 4. History of tobacco abuse, both present on admission. 5. Steroid-induced hyperglycemia with confirmation the patient is not     diabetic. 6. History of attention deficit disorder. 7. History of migraines, stable during this admission.  DISCHARGE MEDICATIONS:  Discharge medications for this patient are as follows: 1. Budesonide 3 mg 3 tablets p.o. daily, that is total of 9 mg p.o.     daily. 2. Cipro 500 p.o. b.i.d. for 4 days. 3. Flagyl 250 p.o. t.i.d. for 4 days. 4. Prilosec over-the-counter p.o. daily. 5. Os-Cal D 600/400 2 tablets p.o. b.i.d., this is an increase from     previous.  She was on this only once a day. 6. Adderall 10 mg p.o. b.i.d. 7. Flexeril 10 mg p.o. q.h.s. 8. K-Dur 40 mEq p.o. daily. 9. Lisinopril 10 p.o. daily. 10.Percocet 5/325 one tablet p.o. q.8 h. p.r.n. 11.Phenergan 12.5 p.o. q.4 h. p.r.n. 12.Slow-Mag 1 tablet p.o. b.i.d. 13.Sumatriptan 100 p.o. q.h.s. p.r.n. for migraines. 14.Xanax 0.5 p.o. b.i.d. p.r.n. for anxiety. 15.The patient previously was on Nexium.  This is being switched over     to over-the-counter Prilosec which would be cheaper for the      patient.  DISCHARGE DIET:  The patient is on low-sodium diet.  ACTIVITY:  Slowly increase.  DISPOSITION:  Disposition is improved.  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  She presented on day of admission October 21, 2010 with complaints of nausea, vomiting, diarrhea, and generalized abdominal pain.  There was a question initially of whether or not her diarrhea was from gastroenteritis versus Crohn disease flare-up.  She was noted to have some electrolyte abnormalities including calcium of 5.4, potassium of 2.2 and magnesium of 0.1.  She received IV replacement of these electrolytes.  A PTH level was sent off which came back on March 19 as normal and these were all, sounds more consistent with malabsorption secondary to her underlying Crohn disease.  The patient has been followed by Dr. Sharlett Iles in the past of [facility].  There was some question as to whether the patient was able to take her medications for them versus a compliance issue. Nevertheless, she continued to have problems with abdominal pain, requiring initially p.o. steroids  which were increased to IV steroids. By the early morning of March 19, her diarrhea had started to ease off; however her abdominal pain persisted, so [facility] was consulted for additional assistance.  Dr. Ardis Hughs saw the patient on behalf of Dr. Sharlett Iles and specifically recommended the following: 1. The patient certainly needs to stop smoking which can lead to flare-     ups of her Crohn's disease.  The patient is pretty adamant about     not restarting prednisone but was okay with starting Entocort.  Dr.     Ardis Hughs then transferred the patient's IV steroids to Entocort.  The     patient was against starting IV infusion therapy such as Humira or     Remicade and for now, the plan will be to treat  the pain.  Stop     smoking, start Entocort and then the long-term plan is that the     goal be to get her off steroids and start her on an immuno-     modulating  drug as an outpatient.  By day of discharge, the     patient was doing well and she was noted have a trend up in her     white blood cell count but she was afebrile and no shift.  This was     felt to be more likely related to her steroids.  She was feeling     better.  She had a relatively solid bowel movement at this point     and her abdominal pain was mild and was not as severe as it was a     day prior.  She has felt to be comfortable for discharge and GI     followed up and gave their blessing for this as well.  The plan     will be for the patient to be discharged on 4 more days of p.o.     antibiotics which were started on day of admission to cover for any     type of infectious process to continue her on 9 mg of Entocort     until she follows up with [facility] in the next 2 to 3 weeks as     well as continue medications for pain.  Given the adjustments in     her medications because the patient is having some financial     assistance issues, she is going to receive 2 sets of prescriptions,     one for getting some medications for 3-day supply in-house and then     long-term for the next 30 days.  Plan will be again to start her on     AZA or 6-mercaptopurine as an outpatient but GI will dictate this     course.  Other medical issues are stable during this     hospitalization.  She was counseled on smoking for her tobacco     abuse.  She is being discharged to home and she will follow up with     Dr. Benay Pillow in the next 1 to 2 weeks and Dr. Sharlett Iles, GI, in     the next 2 to 3 weeks.     Annita Brod, M.D.     SKK/MEDQ  D:  10/26/2010  T:  10/26/2010  Job:  [id number]  cc:   Milus Banister, MD [address] [place], [place] [address]  Loralee Pacas. Sharlett Iles, MD, FACG, FACP, FAGA [address]  [place] [address]  Biagio Borg, MD [address] [address]  Electronically Signed by Gevena Barre M.D. on 10/29/2010 09:54:24 AM

## 2010-10-30 ENCOUNTER — Other Ambulatory Visit: Payer: Self-pay | Admitting: Gastroenterology

## 2010-10-30 LAB — RETICULIN ANTIBODIES, IGA W TITER: Reticulin Ab, IgA: NEGATIVE

## 2010-11-01 ENCOUNTER — Other Ambulatory Visit: Payer: Self-pay | Admitting: Gastroenterology

## 2010-11-02 ENCOUNTER — Other Ambulatory Visit: Payer: Self-pay | Admitting: Gastroenterology

## 2010-11-04 NOTE — Progress Notes (Signed)
Summary: Percocet  Phone Note Refill Request Message from:  Patient on October 25, 2010 8:49 AM  Refills Requested: Medication #1:  PERCOCET 5-325 MG TABS Take 1 tab two times a day as needed - to fill feb 20   Dosage confirmed as above?Dosage Confirmed   Supply Requested: 1 month  Method Requested: Pick up at Office Initial call taken by: Margaret Pyle, CMA,  October 25, 2010 8:49 AM  Follow-up for Phone Call        done hardcopy to LIM side B - dahlia  Follow-up by: Corwin Levins MD,  October 25, 2010 12:45 PM  Additional Follow-up for Phone Call Additional follow up Details #1::        Pt advised, Rx in cabinet for pt pick up Additional Follow-up by: Margaret Pyle, CMA,  October 25, 2010 1:06 PM    New/Updated Medications: PERCOCET 5-325 MG TABS (OXYCODONE-ACETAMINOPHEN) Take 1 tab two times a day as needed - to fill Oct 25, 2010 Prescriptions: PERCOCET 5-325 MG TABS (OXYCODONE-ACETAMINOPHEN) Take 1 tab two times a day as needed - to fill Oct 25, 2010  #60 x 0   Entered and Authorized by:   Corwin Levins MD   Signed by:   Corwin Levins MD on 10/25/2010   Method used:   Print then Give to Patient   RxID:   [id number]

## 2010-11-05 NOTE — Telephone Encounter (Signed)
Willette Cluster , NP is looking for a new GI MD to take pt.

## 2010-11-08 ENCOUNTER — Encounter: Payer: Self-pay | Admitting: *Deleted

## 2010-11-13 LAB — DIFFERENTIAL
Basophils Absolute: 0.2 10*3/uL — ABNORMAL HIGH (ref 0.0–0.1)
Basophils Relative: 1 % (ref 0–1)
Eosinophils Absolute: 0.3 10*3/uL (ref 0.0–0.7)
Eosinophils Relative: 3 % (ref 0–5)
Lymphocytes Relative: 26 % (ref 12–46)
Lymphs Abs: 3.2 10*3/uL (ref 0.7–4.0)
Monocytes Absolute: 0.7 10*3/uL (ref 0.1–1.0)
Monocytes Relative: 6 % (ref 3–12)
Neutro Abs: 8 10*3/uL — ABNORMAL HIGH (ref 1.7–7.7)
Neutrophils Relative %: 65 % (ref 43–77)

## 2010-11-13 LAB — URINALYSIS, ROUTINE W REFLEX MICROSCOPIC
Bilirubin Urine: NEGATIVE
Glucose, UA: NEGATIVE mg/dL
Hgb urine dipstick: NEGATIVE
Ketones, ur: NEGATIVE mg/dL
Nitrite: NEGATIVE
Protein, ur: NEGATIVE mg/dL
Specific Gravity, Urine: 1.01 (ref 1.005–1.030)
Urobilinogen, UA: 0.2 mg/dL (ref 0.0–1.0)
pH: 7 (ref 5.0–8.0)

## 2010-11-13 LAB — COMPREHENSIVE METABOLIC PANEL
ALT: 11 U/L (ref 0–35)
AST: 21 U/L (ref 0–37)
Albumin: 3.8 g/dL (ref 3.5–5.2)
Alkaline Phosphatase: 68 U/L (ref 39–117)
BUN: 3 mg/dL — ABNORMAL LOW (ref 6–23)
CO2: 26 mEq/L (ref 19–32)
Calcium: 8.1 mg/dL — ABNORMAL LOW (ref 8.4–10.5)
Chloride: 103 mEq/L (ref 96–112)
Creatinine, Ser: 0.58 mg/dL (ref 0.4–1.2)
GFR calc Af Amer: >60 mL/min (ref >60)
GFR calc non Af Amer: >60 mL/min (ref >60)
Glucose, Bld: 151 mg/dL — ABNORMAL HIGH (ref 70–99)
Potassium: 2.5 mEq/L — CL (ref 3.5–5.1)
Sodium: 137 mEq/L (ref 135–145)
Total Bilirubin: 0.6 mg/dL (ref 0.3–1.2)
Total Protein: 6.2 g/dL (ref 6.0–8.3)

## 2010-11-13 LAB — CBC
HCT: 42.4 % (ref 36.0–46.0)
Hemoglobin: 14.7 g/dL (ref 12.0–15.0)
MCHC: 34.6 g/dL (ref 30.0–36.0)
MCV: 93.7 fL (ref 78.0–100.0)
Platelets: 427 10*3/uL — ABNORMAL HIGH (ref 150–400)
RBC: 4.53 MIL/uL (ref 3.87–5.11)
RDW: 12.8 % (ref 11.5–15.5)
WBC: 12.3 10*3/uL — ABNORMAL HIGH (ref 4.0–10.5)

## 2010-11-13 LAB — WET PREP, GENITAL
Clue Cells Wet Prep HPF POC: NONE SEEN
Trich, Wet Prep: NONE SEEN
Yeast Wet Prep HPF POC: NONE SEEN

## 2010-11-13 LAB — URINE MICROSCOPIC-ADD ON

## 2010-11-13 LAB — GC/CHLAMYDIA PROBE AMP, GENITAL
Chlamydia, DNA Probe: NEGATIVE
GC Probe Amp, Genital: NEGATIVE

## 2010-11-13 LAB — POCT PREGNANCY, URINE: Preg Test, Ur: NEGATIVE

## 2010-11-13 LAB — LIPASE, BLOOD: Lipase: 16 U/L (ref 11–59)

## 2010-11-13 LAB — H. PYLORI ANTIBODY, IGG: H Pylori IgG: <0.4 {ISR}

## 2010-11-15 LAB — CBC
HCT: 47.1 % — ABNORMAL HIGH (ref 36.0–46.0)
Hemoglobin: 16.1 g/dL — ABNORMAL HIGH (ref 12.0–15.0)
MCHC: 34.1 g/dL (ref 30.0–36.0)
MCV: 96.1 fL (ref 78.0–100.0)
Platelets: 338 10*3/uL (ref 150–400)
RBC: 4.9 MIL/uL (ref 3.87–5.11)
RDW: 13.1 % (ref 11.5–15.5)
WBC: 11.6 10*3/uL — ABNORMAL HIGH (ref 4.0–10.5)

## 2010-11-15 LAB — POCT URINALYSIS DIP (DEVICE)
Bilirubin Urine: NEGATIVE
Glucose, UA: NEGATIVE mg/dL
Hgb urine dipstick: NEGATIVE
Nitrite: NEGATIVE
Protein, ur: 100 mg/dL — AB
Specific Gravity, Urine: >=1.03 (ref 1.005–1.030)
Urobilinogen, UA: 0.2 mg/dL (ref 0.0–1.0)
pH: 6 (ref 5.0–8.0)

## 2010-11-15 LAB — PREGNANCY, URINE: Preg Test, Ur: NEGATIVE

## 2010-11-15 LAB — POCT PREGNANCY, URINE: Preg Test, Ur: NEGATIVE

## 2010-11-17 ENCOUNTER — Telehealth: Payer: Self-pay | Admitting: Gastroenterology

## 2010-11-17 LAB — COMPREHENSIVE METABOLIC PANEL
ALT: 19 U/L (ref 0–35)
AST: 19 U/L (ref 0–37)
Albumin: 2.8 g/dL — ABNORMAL LOW (ref 3.5–5.2)
Alkaline Phosphatase: 173 U/L — ABNORMAL HIGH (ref 39–117)
BUN: 8 mg/dL (ref 6–23)
CO2: 19 mEq/L (ref 19–32)
Calcium: 9.1 mg/dL (ref 8.4–10.5)
Chloride: 105 mEq/L (ref 96–112)
Creatinine, Ser: 0.53 mg/dL (ref 0.4–1.2)
GFR calc Af Amer: >60 mL/min (ref >60)
GFR calc non Af Amer: >60 mL/min (ref >60)
Glucose, Bld: 103 mg/dL — ABNORMAL HIGH (ref 70–99)
Potassium: 3.3 mEq/L — ABNORMAL LOW (ref 3.5–5.1)
Sodium: 136 mEq/L (ref 135–145)
Total Bilirubin: 0.5 mg/dL (ref 0.3–1.2)
Total Protein: 6.2 g/dL (ref 6.0–8.3)

## 2010-11-17 LAB — LACTATE DEHYDROGENASE: LDH: 129 U/L (ref 94–250)

## 2010-11-17 LAB — CBC
HCT: 35.1 % — ABNORMAL LOW (ref 36.0–46.0)
HCT: 40.7 % (ref 36.0–46.0)
Hemoglobin: 11.8 g/dL — ABNORMAL LOW (ref 12.0–15.0)
Hemoglobin: 13.7 g/dL (ref 12.0–15.0)
MCHC: 33.7 g/dL (ref 30.0–36.0)
MCHC: 33.7 g/dL (ref 30.0–36.0)
MCV: 95.4 fL (ref 78.0–100.0)
MCV: 95.4 fL (ref 78.0–100.0)
Platelets: 258 10*3/uL (ref 150–400)
Platelets: 290 10*3/uL (ref 150–400)
RBC: 3.68 MIL/uL — ABNORMAL LOW (ref 3.87–5.11)
RBC: 4.27 MIL/uL (ref 3.87–5.11)
RDW: 13.6 % (ref 11.5–15.5)
RDW: 13.8 % (ref 11.5–15.5)
WBC: 19.7 10*3/uL — ABNORMAL HIGH (ref 4.0–10.5)
WBC: 21.6 10*3/uL — ABNORMAL HIGH (ref 4.0–10.5)

## 2010-11-17 LAB — URIC ACID: Uric Acid, Serum: 5.1 mg/dL (ref 2.4–7.0)

## 2010-11-17 LAB — RPR: RPR Ser Ql: NONREACTIVE

## 2010-11-18 ENCOUNTER — Other Ambulatory Visit: Payer: Self-pay | Admitting: Gastroenterology

## 2010-11-18 NOTE — H&P (Signed)
[name], [name] NO.:  [id number]  MEDICAL RECORD NO.:  [id number]           PATIENT TYPE:  E  LOCATION:  WLED                         FACILITY:  [facility]  PHYSICIAN:  Arlyss Repress, MD        DATE OF BIRTH:  [birth year]  DATE OF ADMISSION:  10/21/2010 DATE OF DISCHARGE:                             HISTORY & PHYSICAL   CHIEF COMPLAINT:  Abdominal pain.  HISTORY OF PRESENT ILLNESS:  A 30 year old female with a history of Crohn disease apparently presents with complaints of nausea, vomiting, diarrhea, abdominal pain since Wednesday.  She describes the pain is intermittent in all over and cannot ascribe a quality to the pain.  The patient denies any bright red blood per rectum or black stool.  She was having multiple bowel movements per day and these are watery stools. The patient notes that her normal number of bowel movements is usually 2 per day.  She is well in excess of this.  The patient thought initiallythat she had caught the flu from her young 35-year-old child as well as her mother.  The patient is not certain that this point in time whether her symptoms are still consistent with viral syndrome.  That is why she presented to the ED.  She denies any fever, chills.  The patient was evaluated in the ED for abdominal pain and her liver tests were within normal limits, however, calcium was noted to be 5.4.  Her magnesium was noted to be 0.1.  The patient's potassium was also noted to be low at 2.2.  The patient will be admitted for workup of nausea, vomiting, diarrhea, abdominal pain as well as hypokalemia, hypocalcemia, and hypomagnesemia.  The patient's abdominal x-ray showed no evidence of obstructive bowel gas pattern, no change since prior study.  PAST MEDICAL HISTORY: 1. Hypertension. 2. ADD (attention deficit disorder). 3. Anxiety. 4. Chronic neck and back pain. 5. Crohn disease with chronic diarrhea. 6. GERD. 7. Endometriosis. 8. Migraine  headaches. 9. History of hypokalemia, hypomagnesemia, and hypocalcemia. 10.Right lower extremity cellulitis. 11.Otitis media. 12.Acne rosacea. 13.Vitamin B12 deficiency.  PAST SURGICAL HISTORY:  Laparoscopic treatment of endometriosis.  SOCIAL HISTORY:  The patient smokes about one half-pack per day.  She occasionally drinks alcohol.  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  She is a single mom with a 45-year-old boy.  FAMILY HISTORY:  Mother is alive, but has a history of breast cancer in remission.  She is alive at 61.  Her grandfather had lung cancer and another grandfather had some type of stomach flexion.  ALLERGIES:  PENICILLIN, DOXYCYCLINE.  MEDICATIONS: 1. Lisinopril 10 mg p.o. daily. 2. Humira previously. 3. Thiamine. 4. Vitamin B12. 5. Caltrate 1 p.o. b.i.d. 6. Percocet 5/325 one p.o. b.i.d. p.r.n. pain. 7. Flexeril 10 mg p.o. t.i.d. p.r.n. 8. Promethazine 12.5 mg p.o. q.4 h. p.r.n. 9. Celebrex one p.o. b.i.d. p.r.n. 10.Adderall XR 20 mg p.o. daily. 11.Potassium chloride 10 mEq p.o. q.i.d. 12.Sumatriptan succinate 100 mg p.o. every other day p.r.n. 13.Slow-Mag 2 p.o. daily. 14.Xanax 0.5 mg p.o. b.i.d. 15.Cymbalta 60  mg p.o. daily. 16.Clobetasol 0.05% cream b.i.d. p.r.n.  REVIEW OF SYSTEMS:  Negative for all 10 organ systems, except for pertinent positives stated above.  PHYSICAL EXAMINATION:  VITAL SIGNS:  Temperature afebrile, pulse 85, blood pressure 153/113, pulse oximetry is 100% on room air. HEENT:  Anicteric.  Positive rosacea.  Mucous membranes moist. NECK:  No JVD. HEART:  Regular rate and rhythm.  S1 and S2.  No murmurs, gallops, or rubs. LUNGS:  Clear to auscultation bilaterally. ABDOMEN:  Soft, nontender, nondistended.  Positive bowel sounds. EXTREMITIES:  No cyanosis, clubbing, or edema. NEUROLOGIC:  Nonfocal.  Cranial nerves II through XII intact.  Reflexes 2+ symmetric, diffuse with downgoing toes  bilaterally.  Motor strength 5/5 in all 4 extremities, pinprick intact.  LABORATORY DATA:  Sodium 139, potassium 2.2 (low), BUN 8, creatinine 0.68, AST 26, ALT 16, albumin 2.9, calcium 5.4, lipase 17, magnesium 0.1.  Urinalysis negative.  Lactic acid 1.1.  Abdominal x-ray no evidence of active pulmonary disease, nonobstructive bowel gas pattern.  ASSESSMENT AND PLAN: 1. Nausea, vomiting, diarrhea:  Possibly secondary to gastroenteritis     versus Crohn exacerbation:  Check a sed rate.  Check CT scan of the     abdomen, pelvis.  Check stools for fecal leukocytes, culture C     difficile.  Treat with Zofran and Phenergan. 2. Abdominal pain:  Check a CT scan of the abdomen and pelvis as     stated above. 3. Hypocalcemia:  Replete magnesium.  Check a vitamin D25 - OH, check     vitamin D1, 25 - OH, check PTH, check magnesium, check phosphorus,     check TSH.  Check a celiac panel in light of diarrhea and B12     deficiency.  The patient is repleted with calcium gluconate in the     ED. 4. Hypomagnesemia:  50 mEq of magnesium IV x1.  Start magnesium oxide     400 mg p.o. daily. 5. Hypokalemia.  The patient received 60 mEq of KCl in the ED and will     receive further supplementation. 6. Deep vein thrombosis prophylaxis.  SCDs and TEDs.     Arlyss Repress, MD     JYK/MEDQ  D:  10/22/2010  T:  10/22/2010  Job:  [id number]  cc:   Biagio Borg, MD [address] [place] [address]  Dr. Reatha Armour, M.D. Fax: [phone]  Electronically Signed by Jani Gravel MD on 11/18/2010 01:48:40 AM

## 2010-11-22 ENCOUNTER — Telehealth: Payer: Self-pay | Admitting: Gastroenterology

## 2010-11-22 NOTE — Telephone Encounter (Signed)
Patient called to confirm she was discharged. Advised she can go [facility] or find another GI in town.

## 2010-11-25 ENCOUNTER — Telehealth: Payer: Self-pay | Admitting: *Deleted

## 2010-11-25 MED ORDER — OXYCODONE-ACETAMINOPHEN 5-325 MG PO TABS: 1.0000 | ORAL_TABLET | Freq: Two times a day (BID) | ORAL | Status: AC | PRN

## 2010-11-25 MED ORDER — AMPHETAMINE-DEXTROAMPHETAMINE 10 MG PO TABS: 20.0000 mg | ORAL_TABLET | Freq: Every day | ORAL | Status: DC

## 2010-11-25 NOTE — Telephone Encounter (Signed)
Patient requesting refills of oxycodone and adderall.

## 2010-11-25 NOTE — Telephone Encounter (Signed)
Done hardcopy to dahlia/LIM B  

## 2010-11-25 NOTE — Telephone Encounter (Signed)
Pt informed, Rx in cabinet for pt pick up  

## 2010-11-30 ENCOUNTER — Telehealth: Payer: Self-pay | Admitting: Gastroenterology

## 2010-11-30 ENCOUNTER — Other Ambulatory Visit: Payer: Self-pay

## 2010-11-30 DIAGNOSIS — K5000 Crohn's disease of small intestine without complications: Secondary | ICD-10-CM

## 2010-11-30 MED ORDER — ALPRAZOLAM 0.5 MG PO TABS: 0.5000 mg | ORAL_TABLET | Freq: Three times a day (TID) | ORAL | Status: DC | PRN

## 2010-11-30 NOTE — Telephone Encounter (Signed)
[facility] requesting refill on Alprazolam 0.5 mg last refill 08/30/10 #60 with 2 refills and last OV 08/12/10.

## 2010-11-30 NOTE — Telephone Encounter (Signed)
Yes  I will refer to different GI

## 2010-11-30 NOTE — Telephone Encounter (Signed)
Please inform pt;  I referred to new GI as she has been dismissed per [employer]

## 2010-12-01 NOTE — Telephone Encounter (Signed)
Dr Jonny Ruiz, are you agreeing to continue seeing pt or to dismiss as well?

## 2010-12-01 NOTE — Telephone Encounter (Signed)
Rx faxed to pharmacy  

## 2010-12-01 NOTE — Telephone Encounter (Signed)
Already answered yesterday  OK to cont as pt

## 2010-12-02 NOTE — Telephone Encounter (Signed)
Pt advised, told to expect a call from [facility] with appt info

## 2010-12-06 ENCOUNTER — Encounter: Payer: Self-pay | Admitting: Internal Medicine

## 2010-12-06 DIAGNOSIS — Z0000 Encounter for general adult medical examination without abnormal findings: Secondary | ICD-10-CM | POA: Insufficient documentation

## 2010-12-08 ENCOUNTER — Ambulatory Visit: Payer: Medicaid Other | Admitting: Internal Medicine

## 2010-12-10 ENCOUNTER — Ambulatory Visit: Payer: Medicaid Other | Admitting: Internal Medicine

## 2010-12-13 ENCOUNTER — Encounter: Payer: Self-pay | Admitting: Internal Medicine

## 2010-12-13 ENCOUNTER — Ambulatory Visit (INDEPENDENT_AMBULATORY_CARE_PROVIDER_SITE_OTHER): Payer: Self-pay | Admitting: Internal Medicine

## 2010-12-13 VITALS — BP 164/104 | HR 128 | Temp 99.2°F | Ht 62.0 in | Wt 144.2 lb

## 2010-12-13 DIAGNOSIS — F909 Attention-deficit hyperactivity disorder, unspecified type: Secondary | ICD-10-CM

## 2010-12-13 DIAGNOSIS — I10 Essential (primary) hypertension: Secondary | ICD-10-CM

## 2010-12-13 DIAGNOSIS — E538 Deficiency of other specified B group vitamins: Secondary | ICD-10-CM | POA: Insufficient documentation

## 2010-12-13 DIAGNOSIS — F411 Generalized anxiety disorder: Secondary | ICD-10-CM

## 2010-12-13 DIAGNOSIS — K5000 Crohn's disease of small intestine without complications: Secondary | ICD-10-CM

## 2010-12-13 MED ORDER — IBUPROFEN 800 MG PO TABS: 800.0000 mg | ORAL_TABLET | Freq: Three times a day (TID) | ORAL | Status: DC | PRN

## 2010-12-13 MED ORDER — AMPHETAMINE-DEXTROAMPHETAMINE 10 MG PO TABS: 20.0000 mg | ORAL_TABLET | Freq: Every day | ORAL | Status: DC

## 2010-12-13 MED ORDER — OXYCODONE-ACETAMINOPHEN 5-325 MG PO TABS: 1.0000 | ORAL_TABLET | ORAL | Status: DC | PRN

## 2010-12-13 MED ORDER — SUMATRIPTAN SUCCINATE 100 MG PO TABS: 100.0000 mg | ORAL_TABLET | Freq: Two times a day (BID) | ORAL | Status: DC | PRN

## 2010-12-13 MED ORDER — AMLODIPINE BESYLATE 5 MG PO TABS: 5.0000 mg | ORAL_TABLET | Freq: Every day | ORAL | Status: DC

## 2010-12-13 MED ORDER — PROMETHAZINE HCL 12.5 MG PO TABS: 25.0000 mg | ORAL_TABLET | Freq: Four times a day (QID) | ORAL | Status: DC | PRN

## 2010-12-13 MED ORDER — ALPRAZOLAM 1 MG PO TABS: 1.0000 mg | ORAL_TABLET | Freq: Three times a day (TID) | ORAL | Status: DC | PRN

## 2010-12-13 MED ORDER — IBUPROFEN 800 MG PO TABS: 800.0000 mg | ORAL_TABLET | Freq: Three times a day (TID) | ORAL | Status: AC | PRN

## 2010-12-13 MED ORDER — CYANOCOBALAMIN 1000 MCG/ML IJ SOLN
1000.0000 ug | Freq: Once | INTRAMUSCULAR | Status: AC
  Administered 2010-12-13: 1000 ug via INTRAMUSCULAR

## 2010-12-13 NOTE — Progress Notes (Signed)
Subjective:    Patient ID: [name], female    DOB: May 28, 1981, 30 y.o.   MRN: [id number]  HPI  Here to f/u; c/o chronic pain abdominal;  Was hospd march 2012,stopped the humira last august;  Then saw Dr Christella Hartigan while hospd and had a falling out with Dr Jarold Motto, and no longer being seen there. Needs new GI.  Currently no vomiting, but nausea pills not working as well as in the past - only takes prn.  Has occasional diarrhea, last episode 4 days ago, no blood. Not eating swell but overall wt about the same.  Not sleeping , and has worsening depressive symptoms, but no suicidal ideation, or panic, though has ongoing anxiety, also increased recently due to mult social stressors-  Lost job, going through custody fight and father in law died recently. Has used exercise in the past for depression, but has not been able to recently - gyms too expensive.  Trying not to take SSRI - tried several yrs ago and did not help.  Has tried prozac, cymbalta, and lexapro but could not tolerate.   Has had counseling, not recent, not sure if current ins will pay - has [place] access but also a charity care program as well to be approved in 2 to 3 wks.  Had 2 panic attack this am / Past Medical History  Diagnosis Date  . VITAMIN B1 DEFICIENCY 09/21/2009  . B12 DEFICIENCY 04/28/2009  . ANXIETY 03/24/2009  . SMOKER 12/02/2009  . ADHD 12/02/2009  . COMMON MIGRAINE 02/05/2010  . HYPERTENSION 03/24/2009  . GERD 03/24/2009  . [facility] INTESTINE 05/19/2009  . Cellulitis and abscess of leg, except foot 02/05/2010  . ECZEMA 05/20/2010  . 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 Cervicalgia 12/02/2009  . Unspecified backache 12/02/2009  . BURSITIS, RIGHT KNEE 02/05/2010  . HEADACHE, CHRONIC 03/24/2009  . Abdominal pain, unspecified site 03/24/2009  . OTITIS MEDIA, LEFT 08/12/2010  . Wheezing 08/12/2010   Past Surgical History  Procedure Date  . Endometrial ablation 01/2009  . Breast enhancement surgery 2004    reports that she  has been smoking Cigarettes.  She does not have any smokeless tobacco history on file. She reports that she drinks alcohol. She reports that she does not use illicit drugs. family history includes Cancer in her maternal grandfather, maternal grandmother, and mother and Clotting disorder in her mother. Allergies  Allergen Reactions  . Doxycycline   . Penicillins     REACTION: "whelts"   Current Outpatient Prescriptions on File Prior to Visit  Medication Sig Dispense Refill  . ALPRAZolam (XANAX) 0.5 MG tablet Take 1 tablet (0.5 mg total) by mouth 3 (three) times daily as needed for sleep or anxiety.  60 tablet  2  . amphetamine-dextroamphetamine (ADDERALL, 10MG ,) 10 MG tablet Take 2 tablets (20 mg total) by mouth daily.  60 tablet  0  . Calcium Carbonate (CALTRATE 600) 1500 MG TABS Take by mouth 2 (two) times daily.        . cyclobenzaprine (FLEXERIL) 10 MG tablet Take 10 mg by mouth 3 (three) times daily as needed.        Marland Kitchen esomeprazole (NEXIUM) 40 MG capsule Take 40 mg by mouth 2 (two) times daily at 10 AM and 5 PM.        . magnesium chloride (SLOW-MAG) 64 MG TBEC Take by mouth 2 (two) times daily.        . potassium chloride (KLOR-CON) 10 MEQ CR tablet Take 10 mEq by  mouth QID.        Marland Kitchen promethazine (PHENERGAN) 12.5 MG tablet Take every four hours as needed       . SUMAtriptan (IMITREX) 100 MG tablet 1 by mouth every other day as needed       . cyanocobalamin (,VITAMIN B-12,) 1000 MCG/ML injection Inject 1,000 mcg into the muscle as directed.        Marland Kitchen lisinopril (PRINIVIL,ZESTRIL) 10 MG tablet Take 10 mg by mouth daily.        Marland Kitchen DISCONTD: Clobetasol Prop Emollient Base (CLOBETASOL PROPIONATE E) 0.05 % emollient cream Apply topically 2 (two) times daily.        Marland Kitchen DISCONTD: methylPREDNISolone (MEDROL DOSEPACK) 4 MG tablet Take by mouth. follow package directions        Review of Systems Review of Systems  Constitutional: Negative for diaphoresis and unexpected weight change.  HENT:  Negative for drooling and tinnitus.   Eyes: Negative for photophobia and visual disturbance.  Respiratory: Negative for choking and stridor.   Gastrointestinal: Negative for vomiting and blood in stool.  Genitourinary: Negative for hematuria and decreased urine volume.  Musculoskeletal: Negative for gait problem.  Skin: Negative for color change and wound.  Neurological: Negative for tremors and numbness.  Psychiatric/Behavioral: Negative for decreased concentration. The patient is not hyperactive.       Objective:   Physical Exam BP 164/104  Pulse 128  Temp(Src) 99.2 F (37.3 C) (Oral)  Ht 5\' 2"  (1.575 m)  Wt 144 lb 4 oz (65.431 kg)  BMI 26.38 kg/m2  SpO2 96% Physical Exam  VS noted, somewhat dissheveled, obese Constitutional: Pt appears well-developed and well-nourished.  HENT: Head: Normocephalic.  Right Ear: External ear normal.  Left Ear: External ear normal.  Eyes: Conjunctivae and EOM are normal. Pupils are equal, round, and reactive to light.  Neck: Normal range of motion. Neck supple.  Cardiovascular: Normal rate and regular rhythm.   Pulmonary/Chest: Effort normal and breath sounds normal.  Abd:  Soft, mild tender right mid abd,  non-distended, + BS Neurological: Pt is alert. No cranial nerve deficit.  Skin: Skin is warm. No erythema.  Psychiatric: Pt behavior is normal. Thought content normal.  3+ nervous       Assessment & Plan:

## 2010-12-13 NOTE — Patient Instructions (Addendum)
Take all new medications as prescribed - the phenergan 25 mg, xanax 1 mg, and amlodipine 5 mg Continue all other medications as before, except stop the phenergan 12.5mg , xanax .5 mg, and lisinopril You had the B12 shot today You will be contacted regarding the referral for: GI - [facility] Please return in 1 month to re-check Blood Pressure

## 2010-12-19 NOTE — Assessment & Plan Note (Signed)
stable overall by hx and exam, most recent lab reviewed with pt, and pt to continue medical treatment as before, to refer to GI  - [facility]

## 2010-12-19 NOTE — Assessment & Plan Note (Signed)
high overall by hx and exam, most recent lab reviewed with pt, and pt to continue medical treatment as before as pt declines any change in tx today  BP Readings from Last 3 Encounters:  12/13/10 164/104  08/12/10 164/98  05/20/10 114/84

## 2010-12-19 NOTE — Assessment & Plan Note (Signed)
Mod to severe, treatment as per emr, declines SSRI, consider psychiatry referral

## 2010-12-19 NOTE — Assessment & Plan Note (Signed)
stable overall by hx and exam, most recent lab reviewed with pt, and pt to continue medical treatment as before  Lab Results  Component Value Date   WBC 24.5* 10/26/2010   HGB 12.9 10/26/2010   HCT 39.2 10/26/2010   PLT 304 10/26/2010   ALT 20 10/26/2010   AST 22 10/26/2010   NA 138 10/26/2010   K 3.4* 10/26/2010   CL 105 10/26/2010   CREATININE 0.63 10/26/2010   BUN 7 10/26/2010   CO2 26 10/26/2010   TSH 1.476 10/22/2010   HGBA1C  Value: 5.7 (NOTE)                                                                       According to the ADA Clinical Practice Recommendations for 2011, when HbA1c is used as a screening test:   >=6.5%   Diagnostic of Diabetes Mellitus           (if abnormal result  is confirmed)  5.7-6.4%   Increased risk of developing Diabetes Mellitus  References:Diagnosis and Classification of Diabetes Mellitus,Diabetes OHFG,9021,11(BZMCE 1):S62-S69 and Standards of Medical Care in         Diabetes - 2011,Diabetes Care,2011,34  (Suppl 1):S11-S61.* 10/25/2010

## 2010-12-21 NOTE — Op Note (Signed)
NAMEELIANE, [name]              ACCOUNT NO.:  [id number]   MEDICAL RECORD NO.:  [id number]          PATIENT TYPE:  AMB   LOCATION:  [place]                           FACILITY:  [facility]   PHYSICIAN:  Freda Munro, M.D.    DATE OF BIRTH:  [birth year]   DATE OF PROCEDURE:  02/03/2009  DATE OF DISCHARGE:  02/03/2009                               OPERATIVE REPORT   PREOPERATIVE DIAGNOSIS:  Pelvic pain.   POSTOPERATIVE DIAGNOSIS:  Pelvic pain with minimal endometriosis.   SURGEON:  Freda Munro, MD   ANESTHESIA:  General endotracheal.   ANTIBIOTICS:  Ancef 1 g.   DRAINS:  Red rubber catheter bladder.   SPECIMENS:  None.   COMPLICATIONS:  None.   ESTIMATED BLOOD LOSS:  Minimal.   PROCEDURE:  The patient was taken to the operating room and placed in  dorsal supine position and general anesthetic was administered without  difficulty.  She was then placed in the dorsal lithotomy position.  She  was prepped and draped in the usual fashion for this procedure and her  bladder was drained with red rubber catheter.  Hulka tenaculum applied  to the anterior cervical lip.  The umbilicus was injected with 0.25%  Marcaine.  Vertical skin incision was made.  The fascia was grasped,  entered sharply.  Parietal peritoneum was entered bluntly.  A 0 Vicryl  suture was placed in a pursestring fashion as on cannula, was placed  into the abdominal cavity and 3 L of carbon dioxide was insufflated.  The patient was then placed in Trendelenburg.  A 5-mm port was placed in  the suprapubic region under direct visualization.  Exam revealed a  normal gallbladder and liver.  The bowel appeared to be normal.  The  pelvis had normal-appearing fallopian tubes and ovaries bilaterally.  There was a small corpus luteum cyst on the right ovary.  The bladder  area appeared to be normal.  In the posterior cul-de-sac, the patient  had multiple clear vesicles on the peritoneal surface with increased  vascularization very  suspicious for endometriosis.  All of these areas  were cauterized with the bipolar device.  Most of the areas above  mentioned were over both uterosacral ligaments.  The lateral sidewalls  of the pelvis appeared to be normal.  There were no adhesions in the  pelvis, ovaries were free and mobile.  At this point, the procedure was  concluded.  The pneumoperitoneum was  released.  The instruments were removed.  The fascia was closed with 0  Monocryl suture, the skin with 4-0 Vicryl suture, and Dermabond.  The  patient tolerated the procedure well.  She was extubated and taken to  recovery room in stable condition.  Instrument and lap counts correct  x1.           ______________________________  Freda Munro, M.D.     MA/MEDQ  D:  02/03/2009  T:  02/04/2009  Job:  [id number]

## 2010-12-27 ENCOUNTER — Other Ambulatory Visit: Payer: Self-pay

## 2010-12-27 MED ORDER — OXYCODONE-ACETAMINOPHEN 5-325 MG PO TABS: 1.0000 | ORAL_TABLET | Freq: Two times a day (BID) | ORAL | Status: DC | PRN

## 2010-12-27 NOTE — Telephone Encounter (Signed)
Pt called requesting refill stating she was not prescribed a full month at last ov 05/07, please advise.

## 2010-12-27 NOTE — Telephone Encounter (Signed)
No answer, no VM, pt no longer works at number provided and cell number disconnected. Rx in cabinet for pt pick up.

## 2010-12-29 ENCOUNTER — Ambulatory Visit: Payer: Self-pay | Admitting: Internal Medicine

## 2010-12-30 ENCOUNTER — Ambulatory Visit: Payer: Self-pay | Admitting: Internal Medicine

## 2010-12-30 DIAGNOSIS — Z0289 Encounter for other administrative examinations: Secondary | ICD-10-CM

## 2011-01-19 ENCOUNTER — Ambulatory Visit: Payer: Self-pay | Admitting: Internal Medicine

## 2011-01-19 DIAGNOSIS — Z0289 Encounter for other administrative examinations: Secondary | ICD-10-CM

## 2011-01-20 ENCOUNTER — Ambulatory Visit: Payer: Self-pay | Admitting: Internal Medicine

## 2011-01-21 ENCOUNTER — Telehealth: Payer: Self-pay

## 2011-01-21 ENCOUNTER — Ambulatory Visit (INDEPENDENT_AMBULATORY_CARE_PROVIDER_SITE_OTHER): Payer: Self-pay | Admitting: Internal Medicine

## 2011-01-21 ENCOUNTER — Encounter: Payer: Self-pay | Admitting: Internal Medicine

## 2011-01-21 VITALS — BP 132/90 | HR 136 | Temp 99.7°F | Ht 62.0 in | Wt 139.0 lb

## 2011-01-21 DIAGNOSIS — M549 Dorsalgia, unspecified: Secondary | ICD-10-CM

## 2011-01-21 DIAGNOSIS — E538 Deficiency of other specified B group vitamins: Secondary | ICD-10-CM

## 2011-01-21 DIAGNOSIS — K5000 Crohn's disease of small intestine without complications: Secondary | ICD-10-CM

## 2011-01-21 DIAGNOSIS — J309 Allergic rhinitis, unspecified: Secondary | ICD-10-CM | POA: Insufficient documentation

## 2011-01-21 DIAGNOSIS — G8929 Other chronic pain: Secondary | ICD-10-CM | POA: Insufficient documentation

## 2011-01-21 HISTORY — DX: Allergic rhinitis, unspecified: J30.9

## 2011-01-21 HISTORY — DX: Dorsalgia, unspecified: M54.9

## 2011-01-21 MED ORDER — PEMIROLAST POTASSIUM 0.1 % OP SOLN: 1.0000 [drp] | Freq: Two times a day (BID) | OPHTHALMIC | Status: DC | PRN

## 2011-01-21 MED ORDER — CELECOXIB 200 MG PO CAPS: 200.0000 mg | ORAL_CAPSULE | Freq: Two times a day (BID) | ORAL | Status: DC

## 2011-01-21 MED ORDER — FEXOFENADINE HCL 180 MG PO TABS: 180.0000 mg | ORAL_TABLET | Freq: Every day | ORAL | Status: DC

## 2011-01-21 MED ORDER — AZELASTINE HCL 0.05 % OP SOLN: 1.0000 [drp] | Freq: Two times a day (BID) | OPHTHALMIC | Status: DC

## 2011-01-21 MED ORDER — CYANOCOBALAMIN 1000 MCG/ML IJ SOLN
1000.0000 ug | Freq: Once | INTRAMUSCULAR | Status: AC
  Administered 2011-01-21: 1000 ug via INTRAMUSCULAR

## 2011-01-21 MED ORDER — MAGNESIUM CHLORIDE 64 MG PO TBEC: 1.0000 | DELAYED_RELEASE_TABLET | Freq: Two times a day (BID) | ORAL | Status: DC

## 2011-01-21 MED ORDER — ESOMEPRAZOLE MAGNESIUM 40 MG PO CPDR: 40.0000 mg | DELAYED_RELEASE_CAPSULE | Freq: Two times a day (BID) | ORAL | Status: DC

## 2011-01-21 MED ORDER — PRENATAL RX 60-1 MG PO TABS: 1.0000 | ORAL_TABLET | Freq: Every day | ORAL | Status: DC

## 2011-01-21 MED ORDER — OXYCODONE-ACETAMINOPHEN 7.5-500 MG PO TABS: 1.0000 | ORAL_TABLET | Freq: Two times a day (BID) | ORAL | Status: DC | PRN

## 2011-01-21 NOTE — Assessment & Plan Note (Signed)
With eye symptoms as well - for tx,  to f/u any worsening symptoms or concerns

## 2011-01-21 NOTE — Patient Instructions (Signed)
Take all new medications as prescribed - the PNV (vitamin), percocet, and celebrex Continue all other medications as before You had the B12 shot today The refills you requested were done Please return in 6 months, or sooner if needed Please see our [facility] (or call) at your convenience to see about the GI referral

## 2011-01-21 NOTE — Assessment & Plan Note (Signed)
Still not seen per f/u by new GI - will try refer again

## 2011-01-21 NOTE — Assessment & Plan Note (Signed)
Ongoing with known lumbar disc dz, for med refill today,  to f/u any worsening symptoms or concerns

## 2011-01-21 NOTE — Telephone Encounter (Signed)
Changed to alamast

## 2011-01-21 NOTE — Assessment & Plan Note (Signed)
For routine shot today,  to f/u any worsening symptoms or concerns

## 2011-01-21 NOTE — Telephone Encounter (Signed)
Azelastine was denied by [facility]. Patient should try either Alamast, Alocril or Alomide. They are recommending patient to try at least 2 meds. And fail before prescribing Azelastine.

## 2011-01-25 ENCOUNTER — Telehealth: Payer: Self-pay

## 2011-01-25 MED ORDER — AZITHROMYCIN 250 MG PO TABS: ORAL_TABLET | ORAL | Status: AC

## 2011-01-25 NOTE — Telephone Encounter (Signed)
Pt called stating she has been experiencing ST, ear pain and sinus pressure. Pt is requesting Rx for ABX, please advise.

## 2011-01-25 NOTE — Telephone Encounter (Signed)
Pt advised of Rx/pharmacy 

## 2011-01-25 NOTE — Telephone Encounter (Signed)
On second thought I realize she was just here; ok for zpack - done per emr

## 2011-01-25 NOTE — Telephone Encounter (Signed)
Very sorry, but this is Surveyor, minerals, to prescribe antibx without evaluation

## 2011-01-30 ENCOUNTER — Encounter: Payer: Self-pay | Admitting: Internal Medicine

## 2011-01-30 NOTE — Progress Notes (Signed)
Subjective:    Patient ID: [name], female    DOB: May 28, 1981, 30 y.o.   MRN: [id number]  HPI  Does have several wks ongoing nasal allergy symptoms with clear congestion, itch and sneeze, without fever, pain, ST, cough or wheezing. Abd GI complaints stable, but will need new GI. Due for B12 today. Pt continues to have recurring LBP without change in severity, bowel or bladder change, fever, wt loss,  worsening LE pain/numbness/weakness, gait change or falls. Needs med refills today Past Medical History  Diagnosis Date  . VITAMIN B1 DEFICIENCY 09/21/2009  . B12 DEFICIENCY 04/28/2009  . ANXIETY 03/24/2009  . SMOKER 12/02/2009  . ADHD 12/02/2009  . COMMON MIGRAINE 02/05/2010  . HYPERTENSION 03/24/2009  . GERD 03/24/2009  . [facility] INTESTINE 05/19/2009  . Cellulitis and abscess of leg, except foot 02/05/2010  . ECZEMA 05/20/2010  . 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 Cervicalgia 12/02/2009  . Unspecified backache 12/02/2009  . BURSITIS, RIGHT KNEE 02/05/2010  . HEADACHE, CHRONIC 03/24/2009  . Abdominal pain, unspecified site 03/24/2009  . OTITIS MEDIA, LEFT 08/12/2010  . Wheezing 08/12/2010  . Allergic rhinitis, cause unspecified 01/21/2011  . Chronic back pain 01/21/2011   Past Surgical History  Procedure Date  . Endometrial ablation 01/2009  . Breast enhancement surgery 2004    reports that she has been smoking Cigarettes.  She does not have any smokeless tobacco history on file. She reports that she drinks alcohol. She reports that she does not use illicit drugs. family history includes Cancer in her maternal grandfather, maternal grandmother, and mother and Clotting disorder in her mother. Allergies  Allergen Reactions  . Doxycycline   . Penicillins     REACTION: "whelts"   Current Outpatient Prescriptions on File Prior to Visit  Medication Sig Dispense Refill  . ALPRAZolam (XANAX) 1 MG tablet Take 1 tablet (1 mg total) by mouth 3 (three) times daily as needed.  90 tablet  2  .  amLODipine (NORVASC) 5 MG tablet Take 1 tablet (5 mg total) by mouth daily.  30 tablet  11  . amphetamine-dextroamphetamine (ADDERALL, 10MG,) 10 MG tablet Take 2 tablets (20 mg total) by mouth daily. To fill February 22, 2011  60 tablet  0  . Calcium Carbonate (CALTRATE 600) 1500 MG TABS Take by mouth 2 (two) times daily.        . cyanocobalamin (,VITAMIN B-12,) 1000 MCG/ML injection Inject 1,000 mcg into the muscle as directed.        . cyclobenzaprine (FLEXERIL) 10 MG tablet Take 10 mg by mouth 3 (three) times daily as needed.        . potassium chloride (KLOR-CON) 10 MEQ CR tablet Take 10 mEq by mouth QID.        Marland Kitchen promethazine (PHENERGAN) 12.5 MG tablet Take 2 tablets (25 mg total) by mouth every 6 (six) hours as needed for nausea.  60 tablet  2  . SUMAtriptan (IMITREX) 100 MG tablet Take 1 tablet (100 mg total) by mouth 2 (two) times daily as needed for migraine. 1 by mouth every other day as needed  9 tablet  5   Review of Systems Review of Systems  Constitutional: Negative for diaphoresis and unexpected weight change.  HENT: Negative for drooling and tinnitus.   Eyes: Negative for photophobia and visual disturbance.  Respiratory: Negative for choking and stridor.   Gastrointestinal: Negative for vomiting and blood in stool.      Objective:   Physical Exam BP 132/90  Pulse 136  Temp(Src) 99.7 F (37.6 C) (Oral)  Ht 5' 2"  (1.575 m)  Wt 139 lb (63.05 kg)  BMI 25.42 kg/m2  SpO2 97%  LMP 01/19/2011 Physical Exam  VS noted Constitutional: Pt appears well-developed and well-nourished.  HENT: Head: Normocephalic.  Right Ear: External ear normal.  Left Ear: External ear normal.  Eyes: Conjunctivae and EOM are normal. Pupils are equal, round, and reactive to light.  Neck: Normal range of motion. Neck supple.  Cardiovascular: Normal rate and regular rhythm.   Pulmonary/Chest: Effort normal and breath sounds normal.  Abd:  Soft, NT, non-distended, + BS Neurological: Pt is alert. No  cranial nerve deficit.  Skin: Skin is warm. No erythema.  Psychiatric: Pt behavior is normal. Thought content normal. 1-2+ nervous Spine nontender, no paravertebral spasm today       Assessment & Plan:

## 2011-02-04 ENCOUNTER — Telehealth: Payer: Self-pay

## 2011-02-04 DIAGNOSIS — M549 Dorsalgia, unspecified: Secondary | ICD-10-CM

## 2011-02-04 NOTE — Telephone Encounter (Signed)
Pt called requesting referral to Ortho

## 2011-02-04 NOTE — Telephone Encounter (Signed)
Ok for referral, but please try to coordinate with pt; if she has no insurance she may wish to be seen at [facility] in [place] as local ortho's may not accept

## 2011-02-16 ENCOUNTER — Other Ambulatory Visit: Payer: Self-pay

## 2011-02-16 NOTE — Telephone Encounter (Signed)
Please run a 6 mo controlled subst query

## 2011-02-17 MED ORDER — OXYCODONE-ACETAMINOPHEN 7.5-500 MG PO TABS: 1.0000 | ORAL_TABLET | Freq: Two times a day (BID) | ORAL | Status: AC | PRN

## 2011-02-17 NOTE — Telephone Encounter (Signed)
Substance report generated and placed on MD's desk for review

## 2011-02-17 NOTE — Telephone Encounter (Signed)
Pt informed, Rx in cabinet for pt pick up  

## 2011-03-11 ENCOUNTER — Other Ambulatory Visit: Payer: Self-pay

## 2011-03-11 MED ORDER — ALPRAZOLAM 1 MG PO TABS: 1.0000 mg | ORAL_TABLET | Freq: Three times a day (TID) | ORAL | Status: DC | PRN

## 2011-03-11 NOTE — Telephone Encounter (Signed)
Faxed hardcopy of prescription to [facility]

## 2011-03-15 ENCOUNTER — Other Ambulatory Visit: Payer: Self-pay

## 2011-03-15 MED ORDER — OXYCODONE-ACETAMINOPHEN 5-325 MG PO TABS: 1.0000 | ORAL_TABLET | Freq: Two times a day (BID) | ORAL | Status: DC | PRN

## 2011-03-15 MED ORDER — AMPHETAMINE-DEXTROAMPHETAMINE 10 MG PO TABS: 20.0000 mg | ORAL_TABLET | Freq: Every day | ORAL | Status: DC

## 2011-03-16 ENCOUNTER — Telehealth: Payer: Self-pay

## 2011-03-16 NOTE — Telephone Encounter (Signed)
Pharmacy said was not astelin, but Alamast that it out of stock and needs alternative

## 2011-03-16 NOTE — Telephone Encounter (Signed)
Since astelin is not on her med list, she may have gotten this med from another provider -   Consider contacting that provider

## 2011-03-16 NOTE — Telephone Encounter (Signed)
Pharmacy requesting alternative to Azelastine 0.05% as they cannot get this medication.

## 2011-03-16 NOTE — Telephone Encounter (Signed)
Pt informed, Rx in cabinet for pt pick up  

## 2011-03-16 NOTE — Telephone Encounter (Signed)
Called pharmacist informed of MD's instructions. They informed will contact the patient.

## 2011-03-16 NOTE — Telephone Encounter (Signed)
OK for pt to try OTC Zaditor instead, as this is similar

## 2011-03-19 ENCOUNTER — Other Ambulatory Visit: Payer: Self-pay | Admitting: Internal Medicine

## 2011-03-21 ENCOUNTER — Other Ambulatory Visit: Payer: Self-pay | Admitting: Internal Medicine

## 2011-03-21 ENCOUNTER — Telehealth: Payer: Self-pay

## 2011-03-21 MED ORDER — OXYCODONE-ACETAMINOPHEN 7.5-500 MG PO TABS: 1.0000 | ORAL_TABLET | Freq: Every day | ORAL | Status: DC | PRN

## 2011-03-21 MED ORDER — OXYCODONE-ACETAMINOPHEN 7.5-500 MG PO TABS: 1.0000 | ORAL_TABLET | Freq: Two times a day (BID) | ORAL | Status: DC | PRN

## 2011-03-21 NOTE — Telephone Encounter (Signed)
Patient came to office to pickup rx for Oxycodone 5-325. Informed incorrect dosage. Please advise.

## 2011-03-21 NOTE — Telephone Encounter (Signed)
Patient informed to pickup prescription at front desk. 

## 2011-03-21 NOTE — Telephone Encounter (Signed)
Query printed and placed on MD's desk for review.

## 2011-03-21 NOTE — Telephone Encounter (Signed)
[place] for refill  To d/c celebrex as well

## 2011-03-21 NOTE — Telephone Encounter (Signed)
Not sure what to say, since this strength was the same given in march  Please do [place] controlled subst query

## 2011-03-21 NOTE — Telephone Encounter (Signed)
rx re-done

## 2011-04-05 ENCOUNTER — Other Ambulatory Visit: Payer: Self-pay | Admitting: Gastroenterology

## 2011-04-06 ENCOUNTER — Other Ambulatory Visit: Payer: Self-pay | Admitting: Internal Medicine

## 2011-04-15 ENCOUNTER — Other Ambulatory Visit: Payer: Self-pay

## 2011-04-15 MED ORDER — OXYCODONE-ACETAMINOPHEN 7.5-500 MG PO TABS: 1.0000 | ORAL_TABLET | Freq: Two times a day (BID) | ORAL | Status: DC | PRN

## 2011-04-15 MED ORDER — AMPHETAMINE-DEXTROAMPHETAMINE 10 MG PO TABS: 20.0000 mg | ORAL_TABLET | Freq: Every day | ORAL | Status: DC

## 2011-04-15 NOTE — Telephone Encounter (Signed)
Done hardcopy to dahlia/LIM B  

## 2011-04-15 NOTE — Telephone Encounter (Signed)
Patient is requesting refills on her percocet and adderall. Please call patient when ready for pickup. Call back numbers are [phone] and  (cell [phone]).

## 2011-04-18 NOTE — Telephone Encounter (Signed)
Called patient left detailed message prescriptions requested from Friday Sept.7, 2012 are ready for pickup at front desk.

## 2011-04-18 NOTE — Telephone Encounter (Signed)
Called patient left message prescriptions requested are ready for pickup at front desk.

## 2011-04-20 ENCOUNTER — Telehealth: Payer: Self-pay

## 2011-04-20 NOTE — Telephone Encounter (Signed)
Called the pharmacy informed of MD's instructions.

## 2011-04-20 NOTE — Telephone Encounter (Signed)
Christy pharmacist with [facility] stated the patients adderall is not due to be filled unitl April 27, 2011 based on their last fill date. Patient is there to pickup, can she get early?? Call back number [phone]

## 2011-04-20 NOTE — Telephone Encounter (Signed)
Sorry no, pt should only refill meds on dates deterimined

## 2011-05-05 LAB — POCT I-STAT, CHEM 8
BUN: <3 — ABNORMAL LOW
Calcium, Ion: 0.79 — ABNORMAL LOW
Chloride: 100
Creatinine, Ser: 0.7
Glucose, Bld: 120 — ABNORMAL HIGH
HCT: 40
Hemoglobin: 13.6
Potassium: 2.6 — CL
Sodium: 135
TCO2: 24

## 2011-05-05 LAB — DIFFERENTIAL
Basophils Absolute: 0
Basophils Relative: 0
Eosinophils Absolute: 0.3
Eosinophils Relative: 5
Lymphocytes Relative: 34
Lymphs Abs: 2.2
Monocytes Absolute: 0.5
Monocytes Relative: 8
Neutro Abs: 3.3
Neutrophils Relative %: 52

## 2011-05-05 LAB — RAPID URINE DRUG SCREEN, HOSP PERFORMED
Amphetamines: NOT DETECTED
Barbiturates: NOT DETECTED
Benzodiazepines: POSITIVE — AB
Cocaine: NOT DETECTED
Opiates: NOT DETECTED
Tetrahydrocannabinol: NOT DETECTED

## 2011-05-05 LAB — WOUND CULTURE: Culture: NO GROWTH

## 2011-05-05 LAB — COMPREHENSIVE METABOLIC PANEL
ALT: 20
AST: 28
Albumin: 2.9 — ABNORMAL LOW
Alkaline Phosphatase: 114
BUN: 1 — ABNORMAL LOW
CO2: 29
Calcium: 6.9 — ABNORMAL LOW
Chloride: 104
Creatinine, Ser: 0.63
GFR calc Af Amer: >60
GFR calc non Af Amer: >60
Glucose, Bld: 100 — ABNORMAL HIGH
Potassium: 2.2 — CL
Sodium: 144
Total Bilirubin: 0.3
Total Protein: 5.6 — ABNORMAL LOW

## 2011-05-05 LAB — CBC
HCT: 35.6 — ABNORMAL LOW
Hemoglobin: 11.9 — ABNORMAL LOW
MCHC: 33.6
MCV: 85.6
Platelets: 302
RBC: 4.16
RDW: 23.4 — ABNORMAL HIGH
WBC: 6.3

## 2011-05-05 LAB — URINALYSIS, ROUTINE W REFLEX MICROSCOPIC
Bilirubin Urine: NEGATIVE
Glucose, UA: NEGATIVE
Ketones, ur: NEGATIVE
Nitrite: NEGATIVE
Protein, ur: NEGATIVE
Specific Gravity, Urine: <1.005 — ABNORMAL LOW
Urobilinogen, UA: 0.2
pH: 6

## 2011-05-05 LAB — PREGNANCY, URINE: Preg Test, Ur: NEGATIVE

## 2011-05-05 LAB — MAGNESIUM
Magnesium: 1 — ABNORMAL LOW
Magnesium: 1 — ABNORMAL LOW

## 2011-05-05 LAB — ETHANOL: Alcohol, Ethyl (B): 223 — ABNORMAL HIGH

## 2011-05-05 LAB — URINE MICROSCOPIC-ADD ON

## 2011-05-16 ENCOUNTER — Other Ambulatory Visit: Payer: Self-pay

## 2011-05-16 MED ORDER — OXYCODONE-ACETAMINOPHEN 7.5-500 MG PO TABS: 1.0000 | ORAL_TABLET | Freq: Two times a day (BID) | ORAL | Status: DC | PRN

## 2011-05-16 NOTE — Telephone Encounter (Signed)
Called the patient informed prescription requested is ready for pickup at the front desk.

## 2011-05-16 NOTE — Telephone Encounter (Signed)
Done hardcopy to robin  

## 2011-05-16 NOTE — Telephone Encounter (Signed)
Patient is requesting a refill on Oxycodone. Call when ready for pickup [phone] or [phone]

## 2011-06-13 ENCOUNTER — Other Ambulatory Visit: Payer: Self-pay

## 2011-06-13 NOTE — Telephone Encounter (Signed)
Pharmacy called to request refill on Xanax, last refill was 05/09/2011 #90. [facility] [address] please advise

## 2011-06-14 MED ORDER — ALPRAZOLAM 1 MG PO TABS: 1.0000 mg | ORAL_TABLET | Freq: Three times a day (TID) | ORAL | Status: DC | PRN

## 2011-06-14 NOTE — Telephone Encounter (Signed)
Faxed hardcopy to pharmacy. 

## 2011-06-14 NOTE — Telephone Encounter (Signed)
Done hardcopy to robin  

## 2011-06-16 ENCOUNTER — Other Ambulatory Visit: Payer: Self-pay

## 2011-06-16 MED ORDER — OXYCODONE-ACETAMINOPHEN 7.5-500 MG PO TABS: 1.0000 | ORAL_TABLET | Freq: Two times a day (BID) | ORAL | Status: DC | PRN

## 2011-06-16 MED ORDER — AMPHETAMINE-DEXTROAMPHETAMINE 10 MG PO TABS: 20.0000 mg | ORAL_TABLET | Freq: Every day | ORAL | Status: DC

## 2011-06-16 NOTE — Telephone Encounter (Signed)
Done hardcopy to robin  

## 2011-06-16 NOTE — Telephone Encounter (Signed)
Patient is requesting a refill on her adderall and percocet. Call back number when ready is [phone] and home number is [phone]

## 2011-06-17 ENCOUNTER — Other Ambulatory Visit: Payer: Self-pay

## 2011-06-17 MED ORDER — CYCLOBENZAPRINE HCL 10 MG PO TABS: 10.0000 mg | ORAL_TABLET | Freq: Three times a day (TID) | ORAL | Status: DC | PRN

## 2011-06-17 NOTE — Telephone Encounter (Signed)
Called the patient left message that prescriptions requested are ready for pickup at front desk. 

## 2011-07-12 ENCOUNTER — Other Ambulatory Visit: Payer: Self-pay

## 2011-07-12 MED ORDER — OXYCODONE-ACETAMINOPHEN 7.5-500 MG PO TABS: 1.0000 | ORAL_TABLET | Freq: Two times a day (BID) | ORAL | Status: DC | PRN

## 2011-07-12 NOTE — Telephone Encounter (Signed)
Done hardcopy to robin  

## 2011-07-13 NOTE — Telephone Encounter (Signed)
Called the patient to inform prescription requested is ready for pickup, left message to call back

## 2011-07-13 NOTE — Telephone Encounter (Signed)
Called the patient informed prescription is ready for pickup at front desk.

## 2011-07-13 NOTE — Telephone Encounter (Signed)
Called the patient left message to call back 

## 2011-07-14 ENCOUNTER — Ambulatory Visit: Payer: Self-pay | Admitting: Internal Medicine

## 2011-08-03 ENCOUNTER — Telehealth: Payer: Self-pay

## 2011-08-03 NOTE — Telephone Encounter (Signed)
Patient called to request antibiotic informed has not been seen since June 2012 and would need OV due to office policy. The patient agreed and did schedule OV with [facility].

## 2011-08-04 ENCOUNTER — Ambulatory Visit: Payer: Self-pay | Admitting: Internal Medicine

## 2011-08-04 ENCOUNTER — Encounter: Payer: Self-pay | Admitting: Internal Medicine

## 2011-08-04 ENCOUNTER — Ambulatory Visit (INDEPENDENT_AMBULATORY_CARE_PROVIDER_SITE_OTHER): Payer: Self-pay | Admitting: Internal Medicine

## 2011-08-04 VITALS — BP 130/90 | HR 109 | Temp 98.8°F | Ht 62.0 in | Wt 128.5 lb

## 2011-08-04 DIAGNOSIS — IMO0002 Reserved for concepts with insufficient information to code with codable children: Secondary | ICD-10-CM

## 2011-08-04 DIAGNOSIS — M542 Cervicalgia: Secondary | ICD-10-CM

## 2011-08-04 DIAGNOSIS — M5416 Radiculopathy, lumbar region: Secondary | ICD-10-CM | POA: Insufficient documentation

## 2011-08-04 DIAGNOSIS — N809 Endometriosis, unspecified: Secondary | ICD-10-CM | POA: Insufficient documentation

## 2011-08-04 DIAGNOSIS — E538 Deficiency of other specified B group vitamins: Secondary | ICD-10-CM

## 2011-08-04 DIAGNOSIS — F909 Attention-deficit hyperactivity disorder, unspecified type: Secondary | ICD-10-CM

## 2011-08-04 DIAGNOSIS — Z23 Encounter for immunization: Secondary | ICD-10-CM

## 2011-08-04 DIAGNOSIS — H6690 Otitis media, unspecified, unspecified ear: Secondary | ICD-10-CM

## 2011-08-04 DIAGNOSIS — H6693 Otitis media, unspecified, bilateral: Secondary | ICD-10-CM

## 2011-08-04 DIAGNOSIS — G8929 Other chronic pain: Secondary | ICD-10-CM | POA: Insufficient documentation

## 2011-08-04 MED ORDER — CYANOCOBALAMIN 1000 MCG/ML IJ SOLN
1000.0000 ug | Freq: Once | INTRAMUSCULAR | Status: AC
  Administered 2011-08-04: 1000 ug via INTRAMUSCULAR

## 2011-08-04 MED ORDER — SULFAMETHOXAZOLE-TRIMETHOPRIM 800-160 MG PO TABS: 1.0000 | ORAL_TABLET | Freq: Two times a day (BID) | ORAL | Status: AC

## 2011-08-04 MED ORDER — METHYLPHENIDATE HCL 10 MG PO TABS: ORAL_TABLET | ORAL | Status: DC

## 2011-08-04 MED ORDER — OXYCODONE-ACETAMINOPHEN 10-325 MG PO TABS: 1.0000 | ORAL_TABLET | Freq: Two times a day (BID) | ORAL | Status: DC | PRN

## 2011-08-04 NOTE — Assessment & Plan Note (Signed)
Severe, with hx of MRSA in the past, for septra course asd,  to f/u any worsening symptoms or concerns

## 2011-08-04 NOTE — Assessment & Plan Note (Signed)
Ok to change to ritalin 10 tid, which I believe is likely less expensive

## 2011-08-04 NOTE — Assessment & Plan Note (Signed)
For b12 IM today, to f/u any worsening symptoms or concerns  

## 2011-08-04 NOTE — Progress Notes (Signed)
Subjective:    Patient ID: [name], female    DOB: [birth year], 30 y.o.   MRN: [id number]  HPI  Here to f/u; overall doing ok, but has no drug plan, but thinks she is covered today by [facility] care for billings otherwise;  adderall too expensive and requests alternative, with ongoing difficulty with low concentration, task completion, and is single mother.  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 Pt denies new neurological symptoms such as new headache, or facial or extremity weakness or numbness   Pt denies polydipsia, polyuria.  Due for B12 shot today, as well as flu shot.  Also with chronic LBP, but 2-3 mo worsening bilat pain and numbness, without weakness, gait change, fever, trauma, or fall.  Asks for MRI as she is now covered as above, and pain control as well.   Past Medical History  Diagnosis Date  . VITAMIN B1 DEFICIENCY 09/21/2009  . B12 DEFICIENCY 04/28/2009  . ANXIETY 03/24/2009  . SMOKER 12/02/2009  . ADHD 12/02/2009  . COMMON MIGRAINE 02/05/2010  . HYPERTENSION 03/24/2009  . GERD 03/24/2009  . [facility] INTESTINE 05/19/2009  . Cellulitis and abscess of leg, except foot 02/05/2010  . ECZEMA 05/20/2010  . 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 Cervicalgia 12/02/2009  . Unspecified backache 12/02/2009  . BURSITIS, RIGHT KNEE 02/05/2010  . HEADACHE, CHRONIC 03/24/2009  . Abdominal pain, unspecified site 03/24/2009  . OTITIS MEDIA, LEFT 08/12/2010  . Wheezing 08/12/2010  . Allergic rhinitis, cause unspecified 01/21/2011  . Chronic back pain 01/21/2011   Past Surgical History  Procedure Date  . Endometrial ablation 01/2009  . Breast enhancement surgery 2004    reports that she has  been smoking Cigarettes.  She does not have any smokeless tobacco history on file. She reports that she drinks alcohol. She reports that she does not use illicit drugs. family history includes Cancer in her maternal grandfather, maternal grandmother, and mother and Clotting disorder in her mother. Allergies  Allergen Reactions  . Penicillins     REACTION: "whelts"   Current Outpatient Prescriptions on File Prior to Visit  Medication Sig Dispense Refill  . ALPRAZolam (XANAX) 1 MG tablet Take 1 tablet (1 mg total) by mouth 3 (three) times daily as needed.  90 tablet  2  . amLODipine (NORVASC) 5 MG tablet Take 1 tablet (5 mg total) by mouth daily.  30 tablet  11  . azelastine (OPTIVAR) 0.05 % ophthalmic solution Place 1 drop into the right eye 2 (two) times daily.  6 mL  1  . Calcium Carbonate (CALTRATE 600) 1500 MG TABS Take by mouth 2 (two) times daily.        . cyanocobalamin (,VITAMIN B-12,) 1000 MCG/ML injection Inject 1,000 mcg into the muscle as directed.        . cyclobenzaprine (FLEXERIL) 10 MG tablet Take 1 tablet (10 mg total) by mouth 3 (three) times daily as needed.  60 tablet  6  . esomeprazole (NEXIUM) 40 MG capsule Take 1 capsule (40 mg total) by mouth 2 (two) times daily at 10 AM and 5 PM.  90 capsule  3  . fexofenadine (ALLEGRA) 180 MG tablet Take 1 tablet (180 mg  total) by mouth daily.  30 tablet  2  . ibuprofen (ADVIL,MOTRIN) 800 MG tablet TAKE 1 TABLET BY MOUTH EVERY 8 HOURS AS NEEDED FOR PAIN  90 tablet  1  . KLOR-CON M10 10 MEQ tablet TAKE 2 TABLETS BY MOUTH TWICE A DAY  120 tablet  11  . magnesium chloride (SLOW-MAG) 64 MG TBEC Take 1 tablet (64 mg total) by mouth 2 (two) times daily.  180 tablet  3  . Pemirolast Potassium (ALAMAST) 0.1 % SOLN Apply 1 drop to eye 2 (two) times daily as needed.  1 Bottle  3  . potassium chloride (KLOR-CON) 10 MEQ CR tablet Take 10 mEq by mouth QID.        Marland Kitchen Prenatal Vit-Fe Fumarate-FA (PRENATAL MULTIVITAMIN) 60-1 MG tablet Take 1 tablet by  mouth daily.  90 tablet  3  . promethazine (PHENERGAN) 12.5 MG tablet Take 2 tablets (25 mg total) by mouth every 6 (six) hours as needed for nausea.  60 tablet  2  . SUMAtriptan (IMITREX) 100 MG tablet Take 1 tablet (100 mg total) by mouth 2 (two) times daily as needed for migraine. 1 by mouth every other day as needed  9 tablet  5   No current facility-administered medications on file prior to visit.   Review of Systems Review of Systems  Constitutional: Negative for diaphoresis and unexpected weight change.  HENT: Negative for drooling and tinnitus.   Eyes: Negative for photophobia and visual disturbance.  Respiratory: Negative for choking and stridor.   Gastrointestinal: Negative for vomiting and blood in stool.  Genitourinary: Negative for hematuria and decreased urine volume.  Musculoskeletal: Negative for gait problem.  Skin: Negative for color change and wound.  Neurological: Negative for tremors and numbness.  Psychiatric/Behavioral: Negative for decreased concentration. The patient is not hyperactive.       Objective:   Physical Exam BP 130/90  Pulse 109  Temp(Src) 98.8 F (37.1 C) (Oral)  Ht 5\' 2"  (1.575 m)  Wt 128 lb 8 oz (58.287 kg)  BMI 23.50 kg/m2  SpO2 99% Physical Exam  VS noted, mild ill Constitutional: Pt appears well-developed and well-nourished.  HENT: Head: Normocephalic.  Right Ear: External ear normal.  Left Ear: External ear normal.  Bilat tm's severe erythema.  Sinus nontender bilat.  Pharynx mod erythema Eyes: Conjunctivae and EOM are normal. Pupils are equal, round, and reactive to light.  Neck: Normal range of motion. Neck supple.  Cardiovascular: Normal rate and regular rhythm.   Pulmonary/Chest: Effort normal and breath sounds normal.  Abd:  Soft, NT, non-distended, + BS Neurological: Pt is alert. No cranial nerve deficit. LE intact motor/sens/dtr  Skin: Skin is warm. No erythema.  Psychiatric: Pt behavior is normal. Thought content normal.    Spine nontender, gait intact    Assessment & Plan:

## 2011-08-04 NOTE — Assessment & Plan Note (Signed)
With acute on chronic pain, for pain control, but also MRI as per pt request, consider surgical evaluation

## 2011-08-04 NOTE — Patient Instructions (Addendum)
You had the B12 shot today, and the flu shot Ok to stop the adderall, and the oxycodone 7.5's Take all new medications as prescribed - the ritalin, and the antibiotic, and the oxycodone 10/325 mg You will be contacted regarding the referral for: MRI for the lower back

## 2011-08-10 ENCOUNTER — Inpatient Hospital Stay: Admission: RE | Admit: 2011-08-10 | Payer: Self-pay | Source: Ambulatory Visit

## 2011-09-01 ENCOUNTER — Telehealth: Payer: Self-pay | Admitting: Internal Medicine

## 2011-09-01 DIAGNOSIS — F909 Attention-deficit hyperactivity disorder, unspecified type: Secondary | ICD-10-CM

## 2011-09-01 NOTE — Telephone Encounter (Signed)
The patient called and is requesting a refill of oxycodone 10mg  and ridilin 10mg    Thanks!!!

## 2011-09-02 MED ORDER — OXYCODONE-ACETAMINOPHEN 10-325 MG PO TABS: 1.0000 | ORAL_TABLET | Freq: Two times a day (BID) | ORAL | Status: AC | PRN

## 2011-09-02 MED ORDER — METHYLPHENIDATE HCL 10 MG PO TABS: ORAL_TABLET | ORAL | Status: DC

## 2011-09-02 NOTE — Telephone Encounter (Signed)
i printed 

## 2011-09-05 NOTE — Telephone Encounter (Signed)
Pt informed, rx placed upfront ready for pickup.

## 2011-09-05 NOTE — Telephone Encounter (Signed)
Left message for pt to callback office.  

## 2011-09-07 ENCOUNTER — Other Ambulatory Visit: Payer: Self-pay

## 2011-09-07 MED ORDER — ALPRAZOLAM 1 MG PO TABS: 1.0000 mg | ORAL_TABLET | Freq: Three times a day (TID) | ORAL | Status: DC | PRN

## 2011-09-07 NOTE — Telephone Encounter (Signed)
Faxed hardcopy to pharmacy. 

## 2011-09-07 NOTE — Telephone Encounter (Signed)
Done hardcopy to robin  

## 2011-09-14 ENCOUNTER — Telehealth: Payer: Self-pay

## 2011-09-14 DIAGNOSIS — M5416 Radiculopathy, lumbar region: Secondary | ICD-10-CM

## 2011-09-14 NOTE — Telephone Encounter (Signed)
Based on previous exam, I can order the lumbar MRI only -   Done per emr

## 2011-09-14 NOTE — Telephone Encounter (Signed)
The patient called and would like a referral to have MRI of back and neck. She stated she is getting her medicaid back and the MRI was discussed at her last OV. Please advise call back number is [phone] or [phone]

## 2011-09-15 NOTE — Telephone Encounter (Signed)
Called patient left message of information on both phone numbers

## 2011-09-18 ENCOUNTER — Inpatient Hospital Stay: Admission: RE | Admit: 2011-09-18 | Payer: Self-pay | Source: Ambulatory Visit

## 2011-09-26 ENCOUNTER — Other Ambulatory Visit: Payer: Self-pay | Admitting: Internal Medicine

## 2011-09-26 MED ORDER — OXYCODONE-ACETAMINOPHEN 10-325 MG PO TABS: ORAL_TABLET | ORAL | Status: DC

## 2011-09-26 NOTE — Telephone Encounter (Signed)
Done hardcopy to robin  plesae let pt know this is last rx i can do, without MRI or surgury evaluation

## 2011-09-26 NOTE — Telephone Encounter (Signed)
Pt is hoping to get a refill of oxycodone 61m. She states she usually picks up the rx.  Thanks!

## 2011-09-27 NOTE — Telephone Encounter (Signed)
Called patient informed prescription ready for pickup. Also informed of MD's instructions regarding further refills. The patient stated she was going to make appointment with Dr. Jonny Ruiz when she picked up prescription as she is getting her medicaid re- instated.

## 2011-09-27 NOTE — Telephone Encounter (Signed)
Called left message to call back 

## 2011-09-30 ENCOUNTER — Encounter: Payer: Self-pay | Admitting: Internal Medicine

## 2011-09-30 ENCOUNTER — Other Ambulatory Visit (INDEPENDENT_AMBULATORY_CARE_PROVIDER_SITE_OTHER): Payer: Self-pay

## 2011-09-30 ENCOUNTER — Ambulatory Visit (INDEPENDENT_AMBULATORY_CARE_PROVIDER_SITE_OTHER): Payer: Self-pay | Admitting: Internal Medicine

## 2011-09-30 VITALS — BP 152/110 | HR 83 | Temp 98.0°F | Ht 61.0 in | Wt 129.1 lb

## 2011-09-30 DIAGNOSIS — M5416 Radiculopathy, lumbar region: Secondary | ICD-10-CM

## 2011-09-30 DIAGNOSIS — G8929 Other chronic pain: Secondary | ICD-10-CM

## 2011-09-30 DIAGNOSIS — M549 Dorsalgia, unspecified: Secondary | ICD-10-CM

## 2011-09-30 DIAGNOSIS — M25519 Pain in unspecified shoulder: Secondary | ICD-10-CM

## 2011-09-30 DIAGNOSIS — M25511 Pain in right shoulder: Secondary | ICD-10-CM

## 2011-09-30 DIAGNOSIS — M5412 Radiculopathy, cervical region: Secondary | ICD-10-CM | POA: Insufficient documentation

## 2011-09-30 DIAGNOSIS — E538 Deficiency of other specified B group vitamins: Secondary | ICD-10-CM

## 2011-09-30 DIAGNOSIS — M2550 Pain in unspecified joint: Secondary | ICD-10-CM

## 2011-09-30 DIAGNOSIS — I10 Essential (primary) hypertension: Secondary | ICD-10-CM

## 2011-09-30 DIAGNOSIS — IMO0002 Reserved for concepts with insufficient information to code with codable children: Secondary | ICD-10-CM

## 2011-09-30 LAB — SEDIMENTATION RATE: Sed Rate: 4 mm/hr (ref 0–22)

## 2011-09-30 MED ORDER — ESOMEPRAZOLE MAGNESIUM 40 MG PO CPDR: 40.0000 mg | DELAYED_RELEASE_CAPSULE | Freq: Two times a day (BID) | ORAL | Status: DC

## 2011-09-30 MED ORDER — CYCLOBENZAPRINE HCL 10 MG PO TABS: 10.0000 mg | ORAL_TABLET | Freq: Three times a day (TID) | ORAL | Status: DC | PRN

## 2011-09-30 MED ORDER — KETOROLAC TROMETHAMINE 30 MG/ML IJ SOLN
30.0000 mg | Freq: Once | INTRAMUSCULAR | Status: AC
  Administered 2011-09-30: 30 mg via INTRAMUSCULAR

## 2011-09-30 MED ORDER — ONDANSETRON HCL 4 MG PO TABS: 4.0000 mg | ORAL_TABLET | Freq: Three times a day (TID) | ORAL | Status: DC | PRN

## 2011-09-30 MED ORDER — AMLODIPINE BESYLATE 10 MG PO TABS: 10.0000 mg | ORAL_TABLET | Freq: Every day | ORAL | Status: DC

## 2011-09-30 MED ORDER — PREGABALIN 50 MG PO CAPS: 50.0000 mg | ORAL_CAPSULE | Freq: Three times a day (TID) | ORAL | Status: DC

## 2011-09-30 MED ORDER — CYANOCOBALAMIN 1000 MCG/ML IJ SOLN
1000.0000 ug | Freq: Once | INTRAMUSCULAR | Status: AC
  Administered 2011-09-30: 1000 ug via INTRAMUSCULAR

## 2011-09-30 MED ORDER — AMPHETAMINE-DEXTROAMPHETAMINE 20 MG PO TABS: 20.0000 mg | ORAL_TABLET | Freq: Two times a day (BID) | ORAL | Status: DC

## 2011-09-30 MED ORDER — IBUPROFEN 800 MG PO TABS: 800.0000 mg | ORAL_TABLET | Freq: Three times a day (TID) | ORAL | Status: DC | PRN

## 2011-09-30 NOTE — Patient Instructions (Addendum)
Take all new medications as prescribed - the lyrica (starting with samples) If this helps, please call so that Hoyle Sauer in the office can work on Patient Assist program application Continue all other medications as before, including the adderal and pain medications, except change the phenergan to zofran generic as needed for nausea (sent to the pharmacy since it would not print), and increase the amlodipine to 10 mg for blood pressure You had the b12 shot today You had the toradol 30 mg shot today for pain Your refills were done today as requested You will be contacted regarding the referral for: MRI for the neck and lumbar MRI, pending your medicaid application You are given the letter today for the medicaid application When you have the medicaid, please call so the process for the MRI , as well as orthopedic referral can be done Please go to LAB in the [facility] for the blood and/or urine tests to be done today Please call the phone number [phone] (the [place]) for results of testing in 2-3 days;  When calling, simply dial the number, and when prompted enter the MRN number above (the Medical Record Number) and the # key, then the message should start.

## 2011-10-01 ENCOUNTER — Encounter: Payer: Self-pay | Admitting: Internal Medicine

## 2011-10-01 DIAGNOSIS — M25512 Pain in left shoulder: Secondary | ICD-10-CM | POA: Insufficient documentation

## 2011-10-01 DIAGNOSIS — M25511 Pain in right shoulder: Secondary | ICD-10-CM | POA: Insufficient documentation

## 2011-10-01 LAB — RHEUMATOID FACTOR: Rhuematoid fact SerPl-aCnc: <10 IU/mL (ref <=14)

## 2011-10-01 NOTE — Assessment & Plan Note (Signed)
Ok for b12 IM today

## 2011-10-01 NOTE — Assessment & Plan Note (Signed)
elev today I think reactive due to pain, toradol IM given, .Continue all other medications as before, cont to monitor BP and call for persistent elevation

## 2011-10-01 NOTE — Assessment & Plan Note (Signed)
Also with mult fibrositis like tender areas to upper and lower back, ? FMS;  Trial lyrica given 50 mg tid, which would help spinal neuritic pain as well as she has not found neurontin helpful in the past, and rx though I did warn her without medicaid she would likely find the cost without drug plan prohibitive

## 2011-10-01 NOTE — Assessment & Plan Note (Signed)
A signficant issue for her, imaging deferred today, but ideally would cont nsaid tx and refer orthopedic, suspect bilat rot cuff, might benefit from cortisone injection

## 2011-10-01 NOTE — Progress Notes (Signed)
Subjective:    Patient ID: [name], female    DOB: 1981-07-17, 31 y.o.   MRN: [id number]  HPI Here to f/u; brings her young child with her during exam;  Has had some difficutly with eval and tx of her back pain due to lack of insurance and cost concerns, with 2 MRI lumbar deferred per pt.  Most recently has been told she would qualify if has $5100 in anticipated medical bills and needs letter for this today.  Does c/o ongoing pain several types it seems - Pt continues to have recurring lumbar LBP without change in severity but radicular as before, but without bowel or bladder change, fever, wt loss,  worsening LE pain/numbness/weakness, gait change or falls.  Also with worsening c-spine pain in the past 2 wks with radicular pain and numbness to hands bilat, now mod per pt.  Also seems to have diffuse back trigger points present as well today.  Needs B12 shot, asks for toradol as helped last visit, med refills, change of phenergan which does not seem to work well for her nausea, and mentions bilat shoulder pain with decreased ROM with pain on abduction and forward elevation right > left for several weeks as well.  Asks also for rheum labs to consider possibility of inflammatory arthritis.  She thinks she has [facility] today, but in fact does not even though she states she does not receive bills;  Per EMR she currently owe $1071 and has been turned over to collections which I inform her today, but still asks for labs/tx as above.  States BP at home and elsewhere has been < 140/90.  Pt denies chest pain, increased sob or doe, wheezing, orthopnea, PND, increased LE swelling, palpitations, dizziness or syncope.  Pt denies polydipsia, polyuria  Past Medical History  Diagnosis Date  . VITAMIN B1 DEFICIENCY 09/21/2009  . B12 DEFICIENCY 04/28/2009  . ANXIETY 03/24/2009  . SMOKER 12/02/2009  . ADHD 12/02/2009  . COMMON MIGRAINE 02/05/2010  . HYPERTENSION 03/24/2009  . GERD 03/24/2009  . [facility]  [facility] INTESTINE 05/19/2009  . Cellulitis and abscess of leg, except foot 02/05/2010  . ECZEMA 05/20/2010  . 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 Cervicalgia 12/02/2009  . Unspecified backache 12/02/2009  . BURSITIS, RIGHT KNEE 02/05/2010  . HEADACHE, CHRONIC 03/24/2009  . Abdominal pain, unspecified site 03/24/2009  . OTITIS MEDIA, LEFT 08/12/2010  . Wheezing 08/12/2010  . Allergic rhinitis, cause unspecified 01/21/2011  . Chronic back pain 01/21/2011  . Chronic neck pain 08/04/2011  . Endometriosis 08/04/2011   Past Surgical History  Procedure Date  . Endometrial ablation 01/2009  . Breast enhancement surgery 2004    reports that she has been smoking Cigarettes.  She does not have any smokeless tobacco history on file. She reports that she drinks alcohol. She reports that she does not use illicit drugs. family history includes Cancer in her maternal grandfather, maternal grandmother, and mother and Clotting disorder in her mother. Allergies  Allergen Reactions  . Penicillins     REACTION: "whelts"   Current Outpatient Prescriptions on File Prior to Visit  Medication Sig Dispense Refill  . ALPRAZolam (XANAX) 1 MG tablet Take 1 tablet (1 mg total) by mouth 3 (three) times daily as needed. To fill Sep 11, 2011  90 tablet  2  . azelastine (OPTIVAR) 0.05 % ophthalmic solution Place 1 drop into the right eye 2 (two) times daily.  6 mL  1  . Calcium Carbonate (CALTRATE 600) 1500 MG  TABS Take by mouth 2 (two) times daily.        . cyanocobalamin (,VITAMIN B-12,) 1000 MCG/ML injection Inject 1,000 mcg into the muscle as directed.        Marland Kitchen KLOR-CON M10 10 MEQ tablet TAKE 2 TABLETS BY MOUTH TWICE A DAY  120 tablet  11  . magnesium chloride (SLOW-MAG) 64 MG TBEC Take 1 tablet (64 mg total) by mouth 2 (two) times daily.  180 tablet  3  . oxyCODONE-acetaminophen (PERCOCET) 10-325 MG per tablet 1 tab by mouth twice per day as needed for pain  60 tablet  0  . SUMAtriptan (IMITREX) 100 MG tablet Take 1 tablet (100  mg total) by mouth 2 (two) times daily as needed for migraine. 1 by mouth every other day as needed  9 tablet  5  . fexofenadine (ALLEGRA) 180 MG tablet Take 1 tablet (180 mg total) by mouth daily.  30 tablet  2  . Pemirolast Potassium (ALAMAST) 0.1 % SOLN Apply 1 drop to eye 2 (two) times daily as needed.  1 Bottle  3  . potassium chloride (KLOR-CON) 10 MEQ CR tablet Take 10 mEq by mouth QID.        Marland Kitchen Prenatal Vit-Fe Fumarate-FA (PRENATAL MULTIVITAMIN) 60-1 MG tablet Take 1 tablet by mouth daily.  90 tablet  3   No current facility-administered medications on file prior to visit.   Review of Systems Review of Systems  Constitutional: Negative for diaphoresis and unexpected weight change.  HENT: Negative for drooling and tinnitus.   Eyes: Negative for photophobia and visual disturbance.  Respiratory: Negative for choking and stridor.   Gastrointestinal: Negative for vomiting and blood in stool.  Genitourinary: Negative for hematuria and decreased urine volume.  Musculoskeletal: Negative for gait problem.  Skin: Negative for color change and wound.  Neurological: Negative for tremors and numbness.    Objective:   Physical Exam BP 152/110  Pulse 83  Temp(Src) 98 F (36.7 C) (Oral)  Ht 5' 1"  (1.549 m)  Wt 129 lb 2 oz (58.571 kg)  BMI 24.40 kg/m2  SpO2 99% Physical Exam  VS noted, not ill appearing Constitutional: Pt appears well-developed and well-nourished.  HENT: Head: Normocephalic.  Right Ear: External ear normal.  Left Ear: External ear normal.  Eyes: Conjunctivae and EOM are normal. Pupils are equal, round, and reactive to light.  Neck: Normal range of motion. Neck supple.  Cardiovascular: Normal rate and regular rhythm.   Pulmonary/Chest: Effort normal and breath sounds normal.  Abd:  Soft, NT, non-distended, + BS Neurological: Pt is alert. No cranial nerve deficit.  UE and LE motor/sens/dtr intact, gait ok  Skin: Skin is warm. No erythema. No rash Spine nontender;  Back  with mult probable trigger point tender areas Bilat shoulder NT but pain and decr ROM noted to abou 100 degr bilat on forward elev and abduction Psychiatric: Pt behavior is normal. Thought content normal. 1-2+ nervous    Assessment & Plan:

## 2011-10-01 NOTE — Assessment & Plan Note (Signed)
Ok for rheum labs per pt request, though I think less likely to have inflammatory arthritis today

## 2011-10-01 NOTE — Assessment & Plan Note (Signed)
Ok for cervical MRI, letter given to include cost of MRI, oral med tx, referral to ortho which would ideally include eval of spine and bilat shoudlers

## 2011-10-01 NOTE — Assessment & Plan Note (Signed)
OK for lumbar MRI, letter given as documented, as she is correct that total cost of her eval and tx for complaints today would likey exceed $5100 and she may qualify for medicaid on this basis

## 2011-10-03 LAB — ANA: Anti Nuclear Antibody(ANA): NEGATIVE

## 2011-10-07 ENCOUNTER — Other Ambulatory Visit: Payer: Self-pay

## 2011-10-24 ENCOUNTER — Telehealth: Payer: Self-pay

## 2011-10-24 NOTE — Telephone Encounter (Signed)
I cannot continue to provide further pain medications without proper evaluation for need;    Please note if she is approved for [place] Access Medicaid she will need to see her assigned provider as we do not take this insurance  Another option is to apply to be seen at [facility]  Note; pt not due for med as prescribed until mar 20

## 2011-10-24 NOTE — Telephone Encounter (Signed)
Patient called to inform she needs refill on pain medication. She stated that things are still pending on the charity care and she will go to [facility] office tomorrow and should have all completed there tomorrow.  She then can schedule the MRI. Please advise on refill. Call back number is [phone] or [phone]

## 2011-10-25 NOTE — Telephone Encounter (Signed)
Pt has been informed.

## 2011-10-28 ENCOUNTER — Telehealth: Payer: Self-pay

## 2011-10-28 ENCOUNTER — Other Ambulatory Visit: Payer: Self-pay | Admitting: Internal Medicine

## 2011-10-28 DIAGNOSIS — M5416 Radiculopathy, lumbar region: Secondary | ICD-10-CM

## 2011-10-28 NOTE — Telephone Encounter (Signed)
Will need re-order MRI due to previous order expired

## 2011-10-28 NOTE — Telephone Encounter (Signed)
[facility] Pharmacist called to inform received prescription refills, one for Adderall 20 mg BID written by Dr. Jonny Ruiz and a second prescription for Ritalin 10 mg TID written by Dr. Everardo All.  Informed MD and JWJ informed pharmacist to fill only the Adderall and to D/C the Ritalin, pharmacist did so.

## 2011-11-01 ENCOUNTER — Ambulatory Visit (HOSPITAL_COMMUNITY): Admission: RE | Admit: 2011-11-01 | Payer: Self-pay | Source: Ambulatory Visit

## 2011-11-01 ENCOUNTER — Encounter (HOSPITAL_COMMUNITY): Payer: Self-pay | Admitting: *Deleted

## 2011-11-01 ENCOUNTER — Observation Stay (HOSPITAL_COMMUNITY)
Admission: EM | Admit: 2011-11-01 | Discharge: 2011-11-02 | DRG: 392 | Disposition: A | Discharge status: Home / Self Care | Payer: Medicaid Other | Attending: Internal Medicine | Admitting: Internal Medicine

## 2011-11-01 ENCOUNTER — Telehealth: Payer: Self-pay | Admitting: Internal Medicine

## 2011-11-01 DIAGNOSIS — J309 Allergic rhinitis, unspecified: Secondary | ICD-10-CM | POA: Diagnosis present

## 2011-11-01 DIAGNOSIS — R112 Nausea with vomiting, unspecified: Secondary | ICD-10-CM | POA: Diagnosis present

## 2011-11-01 DIAGNOSIS — N809 Endometriosis, unspecified: Secondary | ICD-10-CM | POA: Diagnosis present

## 2011-11-01 DIAGNOSIS — K219 Gastro-esophageal reflux disease without esophagitis: Secondary | ICD-10-CM | POA: Diagnosis present

## 2011-11-01 DIAGNOSIS — F1120 Opioid dependence, uncomplicated: Secondary | ICD-10-CM | POA: Diagnosis present

## 2011-11-01 DIAGNOSIS — K5000 Crohn's disease of small intestine without complications: Secondary | ICD-10-CM | POA: Diagnosis present

## 2011-11-01 DIAGNOSIS — Z880 Allergy status to penicillin: Secondary | ICD-10-CM

## 2011-11-01 DIAGNOSIS — K5090 Crohn's disease, unspecified, without complications: Secondary | ICD-10-CM

## 2011-11-01 DIAGNOSIS — E538 Deficiency of other specified B group vitamins: Secondary | ICD-10-CM | POA: Diagnosis present

## 2011-11-01 DIAGNOSIS — F411 Generalized anxiety disorder: Secondary | ICD-10-CM | POA: Diagnosis present

## 2011-11-01 DIAGNOSIS — R1084 Generalized abdominal pain: Principal | ICD-10-CM | POA: Diagnosis present

## 2011-11-01 DIAGNOSIS — F909 Attention-deficit hyperactivity disorder, unspecified type: Secondary | ICD-10-CM | POA: Diagnosis present

## 2011-11-01 DIAGNOSIS — I10 Essential (primary) hypertension: Secondary | ICD-10-CM | POA: Diagnosis present

## 2011-11-01 DIAGNOSIS — F1920 Other psychoactive substance dependence, uncomplicated: Secondary | ICD-10-CM | POA: Diagnosis present

## 2011-11-01 DIAGNOSIS — G8929 Other chronic pain: Secondary | ICD-10-CM | POA: Diagnosis present

## 2011-11-01 DIAGNOSIS — M5410 Radiculopathy, site unspecified: Secondary | ICD-10-CM | POA: Diagnosis present

## 2011-11-01 DIAGNOSIS — E876 Hypokalemia: Secondary | ICD-10-CM | POA: Diagnosis present

## 2011-11-01 DIAGNOSIS — F17200 Nicotine dependence, unspecified, uncomplicated: Secondary | ICD-10-CM | POA: Diagnosis present

## 2011-11-01 DIAGNOSIS — L259 Unspecified contact dermatitis, unspecified cause: Secondary | ICD-10-CM | POA: Diagnosis present

## 2011-11-01 DIAGNOSIS — Z79899 Other long term (current) drug therapy: Secondary | ICD-10-CM

## 2011-11-01 DIAGNOSIS — M5412 Radiculopathy, cervical region: Secondary | ICD-10-CM

## 2011-11-01 DIAGNOSIS — IMO0002 Reserved for concepts with insufficient information to code with codable children: Secondary | ICD-10-CM | POA: Diagnosis present

## 2011-11-01 LAB — COMPREHENSIVE METABOLIC PANEL
ALT: 19 U/L (ref 0–35)
AST: 25 U/L (ref 0–37)
Albumin: 3.2 g/dL — ABNORMAL LOW (ref 3.5–5.2)
Alkaline Phosphatase: 96 U/L (ref 39–117)
BUN: 3 mg/dL — ABNORMAL LOW (ref 6–23)
CO2: 26 mEq/L (ref 19–32)
Calcium: 9.2 mg/dL (ref 8.4–10.5)
Chloride: 104 mEq/L (ref 96–112)
Creatinine, Ser: 0.57 mg/dL (ref 0.50–1.10)
GFR calc Af Amer: >90 mL/min (ref >90)
GFR calc non Af Amer: >90 mL/min (ref >90)
Glucose, Bld: 82 mg/dL (ref 70–99)
Potassium: 2.5 mEq/L — CL (ref 3.5–5.1)
Sodium: 141 mEq/L (ref 135–145)
Total Bilirubin: 0.3 mg/dL (ref 0.3–1.2)
Total Protein: 6.5 g/dL (ref 6.0–8.3)

## 2011-11-01 LAB — CBC
HCT: 43.1 % (ref 36.0–46.0)
Hemoglobin: 15.2 g/dL — ABNORMAL HIGH (ref 12.0–15.0)
MCH: 33.5 pg (ref 26.0–34.0)
MCHC: 35.3 g/dL (ref 30.0–36.0)
MCV: 94.9 fL (ref 78.0–100.0)
Platelets: 314 10*3/uL (ref 150–400)
RBC: 4.54 MIL/uL (ref 3.87–5.11)
RDW: 13.9 % (ref 11.5–15.5)
WBC: 12.6 10*3/uL — ABNORMAL HIGH (ref 4.0–10.5)

## 2011-11-01 LAB — OCCULT BLOOD, POC DEVICE: Fecal Occult Bld: NEGATIVE

## 2011-11-01 LAB — DIFFERENTIAL
Basophils Absolute: 0.1 10*3/uL (ref 0.0–0.1)
Basophils Relative: 1 % (ref 0–1)
Eosinophils Absolute: 0.1 10*3/uL (ref 0.0–0.7)
Eosinophils Relative: 1 % (ref 0–5)
Lymphocytes Relative: 27 % (ref 12–46)
Lymphs Abs: 3.4 10*3/uL (ref 0.7–4.0)
Monocytes Absolute: 0.6 10*3/uL (ref 0.1–1.0)
Monocytes Relative: 5 % (ref 3–12)
Neutro Abs: 8.4 10*3/uL — ABNORMAL HIGH (ref 1.7–7.7)
Neutrophils Relative %: 67 % (ref 43–77)

## 2011-11-01 LAB — POCT PREGNANCY, URINE: Preg Test, Ur: NEGATIVE

## 2011-11-01 LAB — MAGNESIUM: Magnesium: 1.8 mg/dL (ref 1.5–2.5)

## 2011-11-01 MED ORDER — METHYLPREDNISOLONE SODIUM SUCC 40 MG IJ SOLR
40.0000 mg | Freq: Two times a day (BID) | INTRAMUSCULAR | Status: DC
  Administered 2011-11-01: 40 mg via INTRAVENOUS
  Filled 2011-11-01: qty 1

## 2011-11-01 MED ORDER — OXYCODONE-ACETAMINOPHEN 5-325 MG PO TABS
1.0000 | ORAL_TABLET | ORAL | Status: DC | PRN
  Administered 2011-11-01: 1 via ORAL
  Filled 2011-11-01: qty 1

## 2011-11-01 MED ORDER — OXYCODONE-ACETAMINOPHEN 5-325 MG PO TABS
1.0000 | ORAL_TABLET | Freq: Once | ORAL | Status: AC
  Administered 2011-11-01: 1 via ORAL
  Filled 2011-11-01: qty 1

## 2011-11-01 MED ORDER — SODIUM CHLORIDE 0.9 % IV BOLUS (SEPSIS)
1000.0000 mL | Freq: Once | INTRAVENOUS | Status: AC
  Administered 2011-11-01: 1000 mL via INTRAVENOUS

## 2011-11-01 MED ORDER — HYDROCODONE-ACETAMINOPHEN 5-325 MG PO TABS
1.0000 | ORAL_TABLET | Freq: Once | ORAL | Status: AC
  Administered 2011-11-01: 1 via ORAL
  Filled 2011-11-01: qty 1

## 2011-11-01 MED ORDER — POTASSIUM CHLORIDE 10 MEQ/100ML IV SOLN
10.0000 meq | INTRAVENOUS | Status: AC
  Administered 2011-11-01 (×2): 10 meq via INTRAVENOUS
  Filled 2011-11-01 (×2): qty 100

## 2011-11-01 MED ORDER — OXYCODONE-ACETAMINOPHEN 5-325 MG PO TABS: 1.0000 | ORAL_TABLET | ORAL | Status: DC | PRN

## 2011-11-01 MED ORDER — AMPHETAMINE-DEXTROAMPHETAMINE 20 MG PO TABS
20.0000 mg | ORAL_TABLET | Freq: Two times a day (BID) | ORAL | Status: DC
  Administered 2011-11-01: 20 mg via ORAL
  Filled 2011-11-01: qty 1

## 2011-11-01 MED ORDER — POTASSIUM CHLORIDE CRYS ER 20 MEQ PO TBCR
40.0000 meq | EXTENDED_RELEASE_TABLET | ORAL | Status: AC
  Administered 2011-11-01 – 2011-11-02 (×2): 40 meq via ORAL
  Filled 2011-11-01 (×2): qty 2

## 2011-11-01 MED ORDER — ALPRAZOLAM 0.5 MG PO TABS
1.0000 mg | ORAL_TABLET | Freq: Three times a day (TID) | ORAL | Status: DC | PRN
  Administered 2011-11-02: 1 mg via ORAL
  Filled 2011-11-01: qty 2

## 2011-11-01 MED ORDER — PANTOPRAZOLE SODIUM 40 MG PO TBEC
40.0000 mg | DELAYED_RELEASE_TABLET | Freq: Once | ORAL | Status: AC
  Administered 2011-11-01: 40 mg via ORAL
  Filled 2011-11-01: qty 1

## 2011-11-01 NOTE — ED Notes (Signed)
Patient c/o N/V/D, lower back and abdominal pain x 48-72 hrs approx. Accompanied with bloody stool since yesterday morning. Also, c/o some lightheadedness and pain that shoots down her legs causing them to feel numb at times x "months". Patient reports hx of Chrohn's/UC.

## 2011-11-01 NOTE — ED Notes (Signed)
MD at bedside. 

## 2011-11-01 NOTE — ED Provider Notes (Signed)
History     CSN: [id number]  Arrival date & time 11/01/11  1701   First MD Initiated Contact with Patient 11/01/11 1914      Chief Complaint  Patient presents with  . Diarrhea  . Melena  . Abdominal Pain    (Consider location/radiation/quality/duration/timing/severity/associated sxs/prior treatment) HPI The patient is a 31 yo woman, history of Crohn's, anxiety, and chronic back pain, presenting with abdominal pain and bloody bowel movements.  The patient notes a 2-day history of crampy lower abdominal pain, as well as an increase in frequency of bowel movements from her baseline of 8 BM/day to 16 BM/day, with up to 1 tablespoon of blood in each BM.  The patient notes her symptoms are similar to her typical crohn's symptoms.  She notes associated fatigue and lightheadedness.  She notes chills but no objective fevers, no chest pain, SOB, vomiting, dysuria, or vaginal discharge.  The patient has a history of Crohn's since 2010, initially treated with prednisone for about 1 year (stopped due to side effects of swelling and weight gain), then treated with [place] for 1 year (stopped in 2011, reportedly due to MRSA infection), discharged by her GI physician due to non-compliance with therapy, currently with no primary GI physician.    Past Medical History  Diagnosis Date  . VITAMIN B1 DEFICIENCY 09/21/2009  . B12 DEFICIENCY 04/28/2009  . ANXIETY 03/24/2009  . SMOKER 12/02/2009  . ADHD 12/02/2009  . COMMON MIGRAINE 02/05/2010  . HYPERTENSION 03/24/2009  . GERD 03/24/2009  . CROHN'S [facility] INTESTINE 05/19/2009  . Cellulitis and abscess of leg, except foot 02/05/2010  . ECZEMA 05/20/2010  . 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 Cervicalgia 12/02/2009  . Unspecified backache 12/02/2009  . BURSITIS, RIGHT KNEE 02/05/2010  . HEADACHE, CHRONIC 03/24/2009  . Abdominal pain, unspecified site 03/24/2009  . OTITIS MEDIA, LEFT 08/12/2010  . Wheezing 08/12/2010  . Allergic rhinitis, cause unspecified 01/21/2011  .  Chronic back pain 01/21/2011  . Chronic neck pain 08/04/2011  . Endometriosis 08/04/2011    Past Surgical History  Procedure Date  . Endometrial ablation 01/2009  . Breast enhancement surgery 2004    Family History  Problem Relation Age of Onset  . Cancer Mother     breast  . Clotting disorder Mother   . Cancer Maternal Grandmother     Stomach Cancer  . Cancer Maternal Grandfather     Esophageal Cancer    History  Substance Use Topics  . Smoking status: Current Everyday Smoker -- 0.5 packs/day    Types: Cigarettes  . Smokeless tobacco: Not on file   Comment: 5 daily  . Alcohol Use: Yes     rare    OB History    Grav Para Term Preterm Abortions TAB SAB Ect Mult Living                  Review of Systems General: no changes in weight, changes in appetite Skin: no rash HEENT: no blurry vision, hearing changes, sore throat Pulm: no dyspnea, coughing, wheezing CV: no chest pain, palpitations, shortness of breath Abd: see HPI GU: no dysuria, hematuria, polyuria Ext: no arthralgias, myalgias Neuro: no weakness, numbness, or tingling  Allergies  Penicillins  Home Medications   Current Outpatient Rx  Name Route Sig Dispense Refill  . ALPRAZOLAM 1 MG PO TABS Oral Take 1 mg by mouth 3 (three) times daily as needed. For anxiety.    . AMPHETAMINE-DEXTROAMPHETAMINE 20 MG PO TABS Oral Take 20 mg by mouth  2 (two) times daily.    . AZELASTINE HCL 0.05 % OP SOLN Both Eyes Place 1 drop into both eyes 2 (two) times daily.    Marland Kitchen CALCIUM CARBONATE 600 MG PO TABS Oral Take 600 mg by mouth 2 (two) times daily with a meal.    . CYANOCOBALAMIN 1000 MCG/ML IJ SOLN Intramuscular Inject 1,000 mcg into the muscle every 30 (thirty) days.     . CYCLOBENZAPRINE HCL 10 MG PO TABS Oral Take 1 tablet (10 mg total) by mouth 3 (three) times daily as needed. 60 tablet 6  . ESOMEPRAZOLE MAGNESIUM 40 MG PO CPDR Oral Take 1 capsule (40 mg total) by mouth 2 (two) times daily at 10 AM and 5 PM. 180  capsule 3  . IBUPROFEN 800 MG PO TABS Oral Take 1 tablet (800 mg total) by mouth every 8 (eight) hours as needed for pain. 90 tablet 1  . KLOR-CON M10 10 MEQ PO TBCR  TAKE 2 TABLETS BY MOUTH TWICE A DAY 120 tablet 11  . MAGNESIUM CHLORIDE 64 MG PO TBEC Oral Take 1 tablet (64 mg total) by mouth 2 (two) times daily. 180 tablet 3  . PROMETHAZINE HCL 12.5 MG PO TABS Oral Take 12.5 mg by mouth every 6 (six) hours as needed. For nausea.    . SUMATRIPTAN SUCCINATE 100 MG PO TABS Oral Take 1 tablet (100 mg total) by mouth 2 (two) times daily as needed for migraine. 1 by mouth every other day as needed 9 tablet 5    BP 150/122  Pulse 113  Temp(Src) 98.2 F (36.8 C) (Oral)  Resp 20  Wt 121 lb (54.885 kg)  SpO2 100%  LMP 10/20/2011  Physical Exam PEX General: alert, cooperative, appears mildly anxious HEENT: pupils equal round and reactive to light, vision grossly intact, oropharynx clear and non-erythematous  Neck: supple, no lymphadenopathy Lungs: clear to ascultation bilaterally, normal work of respiration, no wheezes, rales, ronchi Heart: regular rate and rhythm, no murmurs, gallops, or rubs Abdomen: soft, ttp of LLQ and RLQ, non-distended, hyperactive bowel sounds, no guarding, no rebound tenderness Extremities: no cyanosis, clubbing, or edema Neurologic: alert & oriented X3, cranial nerves II-XII intact, strength grossly intact, sensation intact to light touch  ED Course  Procedures (including critical care time)  Labs Reviewed  CBC - Abnormal; Notable for the following:    WBC 12.6 (*)    Hemoglobin 15.2 (*)    All other components within normal limits  DIFFERENTIAL - Abnormal; Notable for the following:    Neutro Abs 8.4 (*)    All other components within normal limits  COMPREHENSIVE METABOLIC PANEL  SEDIMENTATION RATE  OCCULT BLOOD X 1 CARD TO LAB, STOOL   No results found.   No diagnosis found.    MDM  The patient is a 31 yo woman, with a 3-year history of Crohn's,  presenting with symptoms suggestive of crohn's flare  # Acute Crohn's - the patient presents with increased frequency of BM's, blood in BM, and lower abd pain, consistent with prior crohn's flare.  Patient currently taking no outpatient medications to manage crohn's. -cbc, bmet, esr -urine pregnancy test  # Dispo - likely d/c home with prednisone; if labs concerning may admit  Signed, Elnora Morrison, PGY1 11/01/2011, 8:42 PM  Addendum 10:15 pm - labs show hypokalemia, likely secondary to GI losses.  As patient has current hypokalemia, and will likely continue to lose potassium due to diarrhea, will admit to start steroid treatment and replete K.  Hester Mates, MD 11/01/11 2217

## 2011-11-01 NOTE — Telephone Encounter (Signed)
Message copied by Biagio Borg on Tue Nov 01, 2011  5:31 PM ------      Message from: Sharon Seller B      Created: Tue Nov 01, 2011  1:28 PM       The patient called to inform the MRI was schedule for lumbar only and needs to have neck also.

## 2011-11-01 NOTE — ED Notes (Signed)
Pt states "I had a GI doctor but he dropped me, I have abdominal pain, blood in my stool, this episode has been going on about a month"

## 2011-11-01 NOTE — ED Notes (Signed)
Dr. Owens Shark to bedside.

## 2011-11-01 NOTE — Telephone Encounter (Signed)
It appears this occurred b/c the original order from feb 22 expired  Ok to re-order

## 2011-11-02 ENCOUNTER — Encounter (HOSPITAL_COMMUNITY): Payer: Self-pay

## 2011-11-02 ENCOUNTER — Ambulatory Visit (HOSPITAL_COMMUNITY): Admission: RE | Admit: 2011-11-02 | Payer: Medicaid Other | Source: Ambulatory Visit

## 2011-11-02 DIAGNOSIS — R1084 Generalized abdominal pain: Principal | ICD-10-CM | POA: Diagnosis present

## 2011-11-02 DIAGNOSIS — M792 Neuralgia and neuritis, unspecified: Secondary | ICD-10-CM | POA: Insufficient documentation

## 2011-11-02 DIAGNOSIS — M5410 Radiculopathy, site unspecified: Secondary | ICD-10-CM | POA: Diagnosis present

## 2011-11-02 DIAGNOSIS — F1120 Opioid dependence, uncomplicated: Secondary | ICD-10-CM | POA: Diagnosis present

## 2011-11-02 DIAGNOSIS — E876 Hypokalemia: Secondary | ICD-10-CM | POA: Diagnosis present

## 2011-11-02 LAB — RAPID URINE DRUG SCREEN, HOSP PERFORMED
Amphetamines: NOT DETECTED
Barbiturates: NOT DETECTED
Benzodiazepines: NOT DETECTED
Cocaine: NOT DETECTED
Opiates: NOT DETECTED
Tetrahydrocannabinol: NOT DETECTED

## 2011-11-02 LAB — COMPREHENSIVE METABOLIC PANEL
ALT: 17 U/L (ref 0–35)
AST: 24 U/L (ref 0–37)
Albumin: 2.6 g/dL — ABNORMAL LOW (ref 3.5–5.2)
Alkaline Phosphatase: 88 U/L (ref 39–117)
BUN: <3 mg/dL — ABNORMAL LOW (ref 6–23)
CO2: 18 mEq/L — ABNORMAL LOW (ref 19–32)
Calcium: 8.1 mg/dL — ABNORMAL LOW (ref 8.4–10.5)
Chloride: 109 mEq/L (ref 96–112)
Creatinine, Ser: 0.53 mg/dL (ref 0.50–1.10)
GFR calc Af Amer: >90 mL/min (ref >90)
GFR calc non Af Amer: >90 mL/min (ref >90)
Glucose, Bld: 171 mg/dL — ABNORMAL HIGH (ref 70–99)
Potassium: 3.6 mEq/L (ref 3.5–5.1)
Sodium: 139 mEq/L (ref 135–145)
Total Bilirubin: 0.2 mg/dL — ABNORMAL LOW (ref 0.3–1.2)
Total Protein: 5.3 g/dL — ABNORMAL LOW (ref 6.0–8.3)

## 2011-11-02 LAB — CBC
HCT: 39.4 % (ref 36.0–46.0)
Hemoglobin: 13.4 g/dL (ref 12.0–15.0)
MCH: 33 pg (ref 26.0–34.0)
MCHC: 34 g/dL (ref 30.0–36.0)
MCV: 97 fL (ref 78.0–100.0)
Platelets: 314 10*3/uL (ref 150–400)
RBC: 4.06 MIL/uL (ref 3.87–5.11)
RDW: 14.2 % (ref 11.5–15.5)
WBC: 10.2 10*3/uL (ref 4.0–10.5)

## 2011-11-02 LAB — SEDIMENTATION RATE: Sed Rate: 1 mm/hr (ref 0–22)

## 2011-11-02 MED ORDER — SODIUM CHLORIDE 0.9 % IV SOLN
INTRAVENOUS | Status: AC
Start: 2011-11-02 — End: 2011-11-02

## 2011-11-02 MED ORDER — OXYCODONE HCL 5 MG PO TABS
5.0000 mg | ORAL_TABLET | Freq: Two times a day (BID) | ORAL | Status: DC
  Administered 2011-11-02 (×3): 5 mg via ORAL
  Filled 2011-11-02 (×2): qty 1

## 2011-11-02 MED ORDER — PANTOPRAZOLE SODIUM 40 MG PO TBEC
40.0000 mg | DELAYED_RELEASE_TABLET | Freq: Two times a day (BID) | ORAL | Status: DC
  Administered 2011-11-02 (×2): 40 mg via ORAL
  Filled 2011-11-02 (×3): qty 1

## 2011-11-02 MED ORDER — METHYLPREDNISOLONE SODIUM SUCC 40 MG IJ SOLR
40.0000 mg | Freq: Two times a day (BID) | INTRAMUSCULAR | Status: DC
  Filled 2011-11-02 (×2): qty 1

## 2011-11-02 MED ORDER — MORPHINE SULFATE 2 MG/ML IJ SOLN: 2.0000 mg | INTRAMUSCULAR | Status: DC | PRN

## 2011-11-02 MED ORDER — PNEUMOCOCCAL VAC POLYVALENT 25 MCG/0.5ML IJ INJ
0.5000 mL | INJECTION | INTRAMUSCULAR | Status: AC
  Administered 2011-11-02: 0.5 mL via INTRAMUSCULAR
  Filled 2011-11-02 (×2): qty 0.5

## 2011-11-02 MED ORDER — ONDANSETRON HCL 4 MG PO TABS: 4.0000 mg | ORAL_TABLET | Freq: Four times a day (QID) | ORAL | Status: DC | PRN

## 2011-11-02 MED ORDER — MORPHINE SULFATE 4 MG/ML IJ SOLN
4.0000 mg | Freq: Once | INTRAMUSCULAR | Status: AC
  Administered 2011-11-02: 4 mg via INTRAVENOUS
  Filled 2011-11-02: qty 1

## 2011-11-02 MED ORDER — ONDANSETRON HCL 4 MG/2ML IJ SOLN: 4.0000 mg | Freq: Four times a day (QID) | INTRAMUSCULAR | Status: DC | PRN

## 2011-11-02 MED ORDER — OXYCODONE-ACETAMINOPHEN 5-325 MG PO TABS
1.0000 | ORAL_TABLET | Freq: Two times a day (BID) | ORAL | Status: DC
  Administered 2011-11-02 (×3): 1 via ORAL
  Filled 2011-11-02 (×2): qty 1

## 2011-11-02 MED ORDER — PANTOPRAZOLE SODIUM 40 MG PO TBEC: 40.0000 mg | DELAYED_RELEASE_TABLET | Freq: Every day | ORAL | Status: DC

## 2011-11-02 MED ORDER — ALPRAZOLAM 1 MG PO TABS
1.0000 mg | ORAL_TABLET | Freq: Once | ORAL | Status: AC
  Administered 2011-11-02: 1 mg via ORAL
  Filled 2011-11-02: qty 1

## 2011-11-02 MED ORDER — CYCLOBENZAPRINE HCL 10 MG PO TABS
10.0000 mg | ORAL_TABLET | Freq: Three times a day (TID) | ORAL | Status: DC | PRN
  Administered 2011-11-02 (×2): 10 mg via ORAL
  Filled 2011-11-02 (×3): qty 1

## 2011-11-02 MED ORDER — OXYCODONE-ACETAMINOPHEN 10-325 MG PO TABS: 1.0000 | ORAL_TABLET | Freq: Two times a day (BID) | ORAL | Status: DC

## 2011-11-02 MED ORDER — AMPHETAMINE-DEXTROAMPHETAMINE 20 MG PO TABS
20.0000 mg | ORAL_TABLET | Freq: Two times a day (BID) | ORAL | Status: DC
  Administered 2011-11-02: 20 mg via ORAL
  Filled 2011-11-02: qty 1

## 2011-11-02 MED ORDER — ALUM & MAG HYDROXIDE-SIMETH 200-200-20 MG/5ML PO SUSP: 30.0000 mL | Freq: Four times a day (QID) | ORAL | Status: DC | PRN

## 2011-11-02 MED ORDER — ALPRAZOLAM 1 MG PO TABS: 1.0000 mg | ORAL_TABLET | Freq: Three times a day (TID) | ORAL | Status: DC | PRN

## 2011-11-02 NOTE — H&P (Addendum)
PCP:   Cathlean Cower, MD, MD   Chief Complaint:  Abdominal pain nausea vomiting diarrhea  HPI: 31 year old female with a history of Crohn's and chronic narcotic dependency and anxiety who presents to the emergency department with several days of worsening nausea vomiting and diarrhea. She has a history of Crohn's disease and has not seen a GI physician for over a 1 year. She normally has over 5 loose bowel movements a day and says that she vomits all the time but today she had over 20 episodes of diarrhea and several episodes of vomiting which is unusual for her. She also has been having some lower right and left quadrant abdominal crampiness. She denies any overt bleeding. She denies running any fevers at home. She has been fired from Dr. Buel Ream office at [employer] over a year ago for noncompliance however she denies being noncompliant. At that time she tried to call [facility] but she says they refused to see her because she was dismissed from Dr. Buel Ream office at the [facility]. She says that she was also referred to [facility] by Dr. Sharlett Iles however she did not make that appointment. She was a patient of Dr. Sharlett Iles for approximately 10 years. She was previously on Humira with Dr. Sharlett Iles which she did have a significant improvement in her Crohn's disease at that time but then had to stop it because of an infection she had gotten in her leg MRSA. Her PCP is Dr. Jenny Reichmann whom she says will not treat her Crohn's disease but does prescribe her Percocet. She has not recently been on any prednisone according to her report.  Review of Systems:  Otherwise negative  Past Medical History: Past Medical History  Diagnosis Date  . VITAMIN B1 DEFICIENCY 09/21/2009  . B12 DEFICIENCY 04/28/2009  . ANXIETY 03/24/2009  . SMOKER 12/02/2009  . ADHD 12/02/2009  . COMMON MIGRAINE 02/05/2010  . HYPERTENSION 03/24/2009  . GERD 03/24/2009  . CROHN'S [facility] INTESTINE 05/19/2009  . Cellulitis and abscess of leg,  except foot 02/05/2010  . ECZEMA 05/20/2010  . 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 Cervicalgia 12/02/2009  . Unspecified backache 12/02/2009  . BURSITIS, RIGHT KNEE 02/05/2010  . HEADACHE, CHRONIC 03/24/2009  . Abdominal pain, unspecified site 03/24/2009  . OTITIS MEDIA, LEFT 08/12/2010  . Wheezing 08/12/2010  . Allergic rhinitis, cause unspecified 01/21/2011  . Chronic back pain 01/21/2011  . Chronic neck pain 08/04/2011  . Endometriosis 08/04/2011   Past Surgical History  Procedure Date  . Endometrial ablation 01/2009  . Breast enhancement surgery 2004    Medications: Prior to Admission medications   Medication Sig Start Date End Date Taking? Authorizing Provider  ALPRAZolam Duanne Moron) 1 MG tablet Take 1 mg by mouth 3 (three) times daily as needed. For anxiety.   Yes Historical Provider, MD  amphetamine-dextroamphetamine (ADDERALL) 20 MG tablet Take 20 mg by mouth 2 (two) times daily.   Yes Historical Provider, MD  azelastine (OPTIVAR) 0.05 % ophthalmic solution Place 1 drop into both eyes 2 (two) times daily.   Yes Historical Provider, MD  calcium carbonate (OS-CAL) 600 MG TABS Take 600 mg by mouth 2 (two) times daily with a meal.   Yes Historical Provider, MD  cyanocobalamin (,VITAMIN B-12,) 1000 MCG/ML injection Inject 1,000 mcg into the muscle every 30 (thirty) days.    Yes Historical Provider, MD  cyclobenzaprine (FLEXERIL) 10 MG tablet Take 1 tablet (10 mg total) by mouth 3 (three) times daily as needed. 09/30/11  Yes Biagio Borg, MD  esomeprazole (NEXIUM) 40 MG capsule Take 1 capsule (40 mg total) by mouth 2 (two) times daily at 10 AM and 5 PM. 09/30/11  Yes Biagio Borg, MD  ibuprofen (ADVIL,MOTRIN) 800 MG tablet Take 1 tablet (800 mg total) by mouth every 8 (eight) hours as needed for pain. 09/30/11  Yes Biagio Borg, MD  KLOR-CON M10 10 MEQ tablet TAKE 2 TABLETS BY MOUTH TWICE A DAY 04/06/11  Yes Biagio Borg, MD  magnesium chloride (SLOW-MAG) 64 MG TBEC Take 1 tablet (64 mg total) by mouth 2 (two)  times daily. 01/21/11  Yes Biagio Borg, MD  oxyCODONE-acetaminophen (PERCOCET) 10-325 MG per tablet Take 1 tablet by mouth 2 (two) times daily.   Yes Historical Provider, MD  promethazine (PHENERGAN) 12.5 MG tablet Take 12.5 mg by mouth every 6 (six) hours as needed. For nausea.   Yes Historical Provider, MD  SUMAtriptan (IMITREX) 100 MG tablet Take 1 tablet (100 mg total) by mouth 2 (two) times daily as needed for migraine. 1 by mouth every other day as needed 12/13/10  Yes Biagio Borg, MD    Allergies:   Allergies  Allergen Reactions  . Penicillins     REACTION: "whelts"    Social History:  reports that she has been smoking Cigarettes.  She has been smoking about .5 packs per day. She does not have any smokeless tobacco history on file. She reports that she drinks alcohol. She reports that she does not use illicit drugs.  Family History: Family History  Problem Relation Age of Onset  . Cancer Mother     breast  . Clotting disorder Mother   . Cancer Maternal Grandmother     Stomach Cancer  . Cancer Maternal Grandfather     Esophageal Cancer    Physical Exam: Filed Vitals:   11/01/11 1901 11/01/11 1902 11/01/11 2333  BP: 150/122  125/93  Pulse: 113  88  Temp: 98.2 F (36.8 C)  98 F (36.7 C)  TempSrc: Oral  Oral  Resp: 20  16  Weight:  54.885 kg (121 lb)   SpO2: 100%  99%   BP 125/93  Pulse 88  Temp(Src) 98 F (36.7 C) (Oral)  Resp 16  Wt 54.885 kg (121 lb)  SpO2 99%  LMP 10/20/2011 General appearance: alert, cooperative and no distress Lungs: clear to auscultation bilaterally Heart: regular rate and rhythm, S1, S2 normal, no murmur, click, rub or gallop Abdomen: soft, non-tender; bowel sounds normal; no masses,  no organomegaly Extremities: extremities normal, atraumatic, no cyanosis or edema Pulses: 2+ and symmetric Skin: Skin color, texture, turgor normal. No rashes or lesions Neurologic: Grossly normal    Labs on Admission:   [facility] 11/01/11 1930    NA 141  K 2.5*  CL 104  CO2 26  GLUCOSE 82  BUN 3*  CREATININE 0.57  CALCIUM 9.2  MG 1.8  PHOS --    Basename 11/01/11 1930  AST 25  ALT 19  ALKPHOS 96  BILITOT 0.3  PROT 6.5  ALBUMIN 3.2*    Basename 11/01/11 1930  WBC 12.6*  NEUTROABS 8.4*  HGB 15.2*  HCT 43.1  MCV 94.9  PLT 314    Radiological Exams on Admission: No results found.  Assessment/Plan Present on Admission:  31 year old female with acute on chronic abdominal pain, nausea, vomiting, diarrhea with a history of Crohn's disease  .Chronic narcotic dependence .ANXIETY .CROHN'S DISEASE-SMALL INTESTINE .Abdominal pain, generalized .Hypokalemia  We'll treat patient for acute Crohn's exacerbation  with Solu-Medrol. She may need GI consultation however this will need to be situated out as to which GI group to see the patient as she's been dismissed Dr. Buel Ream office. There is a letter of dismissal from Dr. Sharlett Iles in April 2012 for noncompliance which the patient adamantly denies. Also there is a phone note from Dr. Gwynn Burly office on 10/24/2011 for denial of refill of her chronic pain medications. Her last Percocet was filled on February 22 and her pill bottle is currently empty. However patient was not forthcoming about Dr. Gwynn Burly stopping her chronic pain med refills. We'll provide her with some IV fluids replace her potassium placed on a clear diet this time. Wean her steroids as she tolerates. Have a high suspicion for drug-seeking behavior. Her abdominal pain and current symptoms may be due to narcotic withdrawal.   Patient seen after midnight   Dorma Altman [phone] 11/02/2011, 1:27 AM  I'm sorry patient seen before midnight.

## 2011-11-02 NOTE — ED Provider Notes (Signed)
Asked to see pt by rn because persistent pain, but pt also says she wants to go to car.  Reviewed labs, K improved.  She says she always has abd pain. pe mild ttp diffusely with no peritoneal signs.  Will give more mso4 . Plan is still to admit.   Barbara Cower, MD 11/02/11 [phone]

## 2011-11-02 NOTE — ED Notes (Signed)
Patient sleeping

## 2011-11-02 NOTE — ED Notes (Signed)
Patient complains of left knee swelling due to fibromyalgia and arthritis, examined both knees and no swelling visible, will continue to monitor

## 2011-11-02 NOTE — ED Notes (Signed)
All orthostatic vitals were done between 0600 am and 0612, vitals charted at the wrong time for 0634, were completed at 0612 am.

## 2011-11-02 NOTE — ED Notes (Signed)
Report called to [facility], patient transported via wheelchair

## 2011-11-02 NOTE — Discharge Summary (Signed)
Physician Discharge Summary  Patient ID: [name] MRN: [id number] DOB/AGE: October 29, 1980 31 y.o.  Admit date: 11/01/2011 Discharge date: 11/02/2011  Primary Care Physician:  Cathlean Cower, MD, MD   Discharge Diagnoses:    Present on Admission:  .Chronic narcotic dependence .ANXIETY .CROHN'S DISEASE-SMALL INTESTINE .Abdominal pain, generalized .Hypokalemia .Radiculopathy  Discharge Medications:  Medication List  As of 11/02/2011  5:55 PM   TAKE these medications         ALPRAZolam 1 MG tablet   Commonly known as: XANAX   Take 1 tablet (1 mg total) by mouth 3 (three) times daily as needed (Take 2 mg 30 minutes prior to MRI, do not take if you do not have a driver.). For anxiety.      amphetamine-dextroamphetamine 20 MG tablet   Commonly known as: ADDERALL   Take 20 mg by mouth 2 (two) times daily.      azelastine 0.05 % ophthalmic solution   Commonly known as: OPTIVAR   Place 1 drop into both eyes 2 (two) times daily.      calcium carbonate 600 MG Tabs   Commonly known as: OS-CAL   Take 600 mg by mouth 2 (two) times daily with a meal.      cyanocobalamin 1000 MCG/ML injection   Commonly known as: (VITAMIN B-12)   Inject 1,000 mcg into the muscle every 30 (thirty) days.      cyclobenzaprine 10 MG tablet   Commonly known as: FLEXERIL   Take 1 tablet (10 mg total) by mouth 3 (three) times daily as needed.      esomeprazole 40 MG capsule   Commonly known as: NEXIUM   Take 1 capsule (40 mg total) by mouth 2 (two) times daily at 10 AM and 5 PM.      ibuprofen 800 MG tablet   Commonly known as: ADVIL,MOTRIN   Take 1 tablet (800 mg total) by mouth every 8 (eight) hours as needed for pain.      KLOR-CON M10 10 MEQ tablet   Generic drug: potassium chloride   TAKE 2 TABLETS BY MOUTH TWICE A DAY      magnesium chloride 64 MG Tbec   Commonly known as: SLOW-MAG   Take 1 tablet (64 mg total) by mouth 2 (two) times daily.      oxyCODONE-acetaminophen 10-325 MG per  tablet   Commonly known as: PERCOCET   Take 1 tablet by mouth 2 (two) times daily.      promethazine 12.5 MG tablet   Commonly known as: PHENERGAN   Take 12.5 mg by mouth every 6 (six) hours as needed. For nausea.      SUMAtriptan 100 MG tablet   Commonly known as: IMITREX   Take 1 tablet (100 mg total) by mouth 2 (two) times daily as needed for migraine. 1 by mouth every other day as needed             Disposition and Follow-up: The patient is being discharged home. She is instructed to followup with Dr. Jenny Reichmann for her MRI results in one week.  Significant Diagnostic Studies:  No results found.  Discharge Laboratory Values: Basic Metabolic Panel:  Lab 33/54/56 0520 11/01/11 1930  NA 139 141  K 3.6 2.5*  CL 109 104  CO2 18* 26  GLUCOSE 171* 82  BUN <3* 3*  CREATININE 0.53 0.57  CALCIUM 8.1* 9.2  MG -- 1.8  PHOS -- --   GFR Estimated Creatinine Clearance: 77.6 ml/min (by C-G formula  based on Cr of 0.53). Liver Function Tests:  Lab 11/02/11 0520 11/01/11 1930  AST 24 25  ALT 17 19  ALKPHOS 88 96  BILITOT 0.2* 0.3  PROT 5.3* 6.5  ALBUMIN 2.6* 3.2*    CBC:  Lab 11/02/11 0520 11/01/11 1930  WBC 10.2 12.6*  NEUTROABS -- 8.4*  HGB 13.4 15.2*  HCT 39.4 43.1  MCV 97.0 94.9  PLT 314 314     Brief H and P: For complete details please refer to admission H and P, but in brief, [name] is a 31 year old female with a past medical history of Crohn's disease and chronic narcotics use who presented to the hospital with nausea, vomiting, and diarrhea in the setting of not being able to get her Percocet refilled by her primary care physician. She was not immediately forthcoming about this. She apparently, has been fired by her GI physicians for noncompliance the past.   Physical Exam at Discharge: BP 131/94  Pulse 78  Temp(Src) 98.2 F (36.8 C) (Oral)  Resp 16  Ht 5' 1"  (1.549 m)  Wt 54.885 kg (121 lb)  BMI 22.86 kg/m2  SpO2 98%  LMP 10/20/2011 Gen:   NAD Cardiovascular:  RRR, No M/R/G Respiratory: Lungs CTAB Gastrointestinal: Abdomen soft, NT/ND with normal active bowel sounds. Extremities: No C/E/C   Hospital Course:  Principal Problem:  *Abdominal pain, generalized / nausea and vomiting / diarrhea  The patient was admitted and put on IV Solu-Medrol. She states that she has taken oral steroids in the past but that these make her sick and that she can't tolerate them well and provide care for her 65 month old child and therefore is reluctant to go on them. The patient's nausea and vomiting resolved with IV fluids and antinausea medications overnight. Her diet was successfully advanced with no return of symptoms other than abdominal pain. The patient was given a prescription for 21 tablets of oxycodone 10/325 with instructions to taper these over time until she is able to followup with Dr. Jenny Reichmann. Further narcotics should only be prescribed by one prescriber, as discussed with Dr. Jenny Reichmann, who will review the patient's MRI findings to determine if further narcotic therapy is indicated. Active Problems:  ANXIETY  We'll give Xanax, 1 mg, 30 minutes prior to MRI scan. (Given prescription for 2(1) mg tablets) CROHN'S DISEASE-SMALL INTESTINE  Patient asks that I not call in a gastroenterologist and wants to be discharged home. She is willing to stay for the MRI scans to be done, but does not want any further treatment despite encouragement for a GI evaluation and initiation of Crohn's specific treatment.  Chronic narcotic dependence  Patient adamantly denies opiate dependency and states that she had run out of her narcotic medicines a long time ago and therefore her presenting symptoms could not be attributable to narcotics withdrawal.  Hypokalemia  Repleted  Radiculopathy The patient refused to have her MRI scans done inpatient. She has an appointment scheduled to have them done as an outpatient tomorrow, and states she will keep this appointment time  and followup with Dr. Jenny Reichmann for her test results.    Recommendations for hospital follow-up: 1. Followup MRI scan results  Diet:  Regular, as tolerated  Activity:  No restrictions, increase activity slowly  Condition at Discharge:   Stable  Time spent on Discharge:  35 minutes  Signed: Dr. Margreta Journey Leopoldo Mazzie Pager [phone] 11/02/2011, 5:55 PM

## 2011-11-02 NOTE — Progress Notes (Signed)
PROGRESS NOTE  [name] ZDG:[id number] DOB: [birth year] DOA: 11/01/2011 PCP: Cathlean Cower, MD, MD  Brief narrative: [name] is a 31 year old female with a past medical history of Crohn's disease and chronic narcotics use who presented to the hospital with nausea, vomiting, and diarrhea in the setting of not being able to get her Percocet refilled by her primary care physician. She was not immediately forthcoming about this. She apparently, has been fired by her GI physicians for noncompliance the past. I did speak with Dr. Jenny Reichmann who confirms that he is reluctant to continue prescribing narcotics without having a specific treatment plan in place. He requests that the patient be evaluated for her chronic back pain with radiculopathy while in the hospital. Patient is willing to have these done tonight, but wants to be discharged if they are unable to perform these, and have them done as an outpatient.  Assessment/Plan: Principal Problem:  *Abdominal pain, generalized / nausea and vomiting / diarrhea The patient was admitted and put on IV Solu-Medrol. She states that she has taken oral steroids in the past but that these make her sick and that she can't tolerate them well and provide care for her 90 month old child and therefore is reluctant to go on them. The patient's nausea and vomiting resolved with IV fluids and antinausea medications overnight. Her diet was successfully advanced with no return of symptoms other than abdominal pain. Active Problems:  ANXIETY We'll give Xanax, 1 mg, 30 minutes prior to MRI scan.  CROHN'S [facility] INTESTINE Patient asks that I not call in a gastroenterologist and wants to be discharged home. She is willing to stay for the MRI scans to be done, but does not want any further treatment despite encouragement for a GI evaluation and initiation of Crohn's specific treatment.  Chronic narcotic dependence Patient adamantly denies opiate dependency and states that  she had run out of her narcotic medicines a long time ago and therefore her presenting symptoms could not be attributable to narcotics withdrawal.  Hypokalemia Repleted   Code Status: Full Family Communication: Grieger,Stacey Mother [phone] Disposition Plan: Home, when stable  Consultants:  Telephone consultation made with the patient's primary care physician, Dr. Cathlean Cower  Procedures:  None  Antibiotics:  None   Subjective  [name] is tearful and anxious. Complains of abdominal pain but no further nausea or vomiting. She denies any blood in the stools. Diet advanced and tolerated without any difficulty. Patient adamantly denies being addicted to pain medications and wants to leave the hospital. She declines further evaluation by gastroenterology or treatment with steroids at this time.   Objective    Interim History: Stable overnight.   Objective: Filed Vitals:   11/02/11 0615 11/02/11 0634 11/02/11 0803 11/02/11 1454  BP:  126/94 122/84 131/94  Pulse:  96 86 78  Temp: 98 F (36.7 C)  98.4 F (36.9 C) 98.2 F (36.8 C)  TempSrc:   Oral Oral  Resp:   18 16  Height:    5' 1"  (1.549 m)  Weight:    54.885 kg (121 lb)  SpO2: 99%  100% 98%   No intake or output data in the 24 hours ending 11/02/11 1627  Exam: Gen:  Anxious, tearful Cardiovascular:  RRR, No M/R/G Respiratory: Lungs CTAB Gastrointestinal: Abdomen soft, NT/ND with normal active bowel sounds. Extremities: No C/E/C    Data Reviewed: Basic Metabolic Panel:  Lab 95/18/84 0520 11/01/11 1930  NA 139 141  K 3.6 2.5*  CL  109 104  CO2 18* 26  GLUCOSE 171* 82  BUN <3* 3*  CREATININE 0.53 0.57  CALCIUM 8.1* 9.2  MG -- 1.8  PHOS -- --   GFR Estimated Creatinine Clearance: 77.6 ml/min (by C-G formula based on Cr of 0.53). Liver Function Tests:  Lab 11/02/11 0520 11/01/11 1930  AST 24 25  ALT 17 19  ALKPHOS 88 96  BILITOT 0.2* 0.3  PROT 5.3* 6.5  ALBUMIN 2.6* 3.2*    CBC:  Lab  11/02/11 0520 11/01/11 1930  WBC 10.2 12.6*  NEUTROABS -- 8.4*  HGB 13.4 15.2*  HCT 39.4 43.1  MCV 97.0 94.9  PLT 314 314    Studies: No results found.  Scheduled Meds:   . amphetamine-dextroamphetamine  20 mg Oral BID  . HYDROcodone-acetaminophen  1 tablet Oral Once  . methylPREDNISolone (SOLU-MEDROL) injection  40 mg Intravenous Q12H  .  morphine injection  4 mg Intravenous Once  . oxyCODONE-acetaminophen  1 tablet Oral BID   And  . oxyCODONE  5 mg Oral BID  . oxyCODONE-acetaminophen  1 tablet Oral Once  . pantoprazole  40 mg Oral Once  . pantoprazole  40 mg Oral BID AC  . pneumococcal 23 valent vaccine  0.5 mL Intramuscular Tomorrow-1000  . potassium chloride  10 mEq Intravenous Q1 Hr x 2  . potassium chloride  40 mEq Oral Q4H  . sodium chloride  1,000 mL Intravenous Once  . DISCONTD: amphetamine-dextroamphetamine  20 mg Oral BID WC  . DISCONTD: methylPREDNISolone (SOLU-MEDROL) injection  40 mg Intravenous Q12H  . DISCONTD: oxyCODONE-acetaminophen  1 tablet Oral BID  . DISCONTD: pantoprazole  40 mg Oral Daily   Continuous Infusions:   . sodium chloride 125 mL/hr at 11/02/11 0127      LOS: 1 day   Jacquelynn Cree, MD Pager [phone]  11/02/2011, 4:27 PM

## 2011-11-02 NOTE — ED Notes (Signed)
Attempted to call report to [facility] on [place], nurse will return call after her lunch

## 2011-11-02 NOTE — Progress Notes (Signed)
Pt. Arrived at [facility] at 2 p.m. At 6 p.m., against doctor's orders, she asked to leave. The doctor spoke with her by phone. Her IV was removed. A brief assessment was done, and she left for home by her own volition. Franchot Erichsen 6:20 PM

## 2011-11-02 NOTE — Discharge Instructions (Signed)
Crohn's Disease Crohn's disease is a long-term (chronic) soreness and redness (inflammation) of the intestines (bowel). It can affect any portion of the digestive tract, from the mouth to the anus. It can also cause problems outside the digestive tract. Crohn's disease is closely related to a disease called ulcerative colitis (together, these two diseases are called inflammatory bowel disease).  CAUSES  The cause of Crohn's disease is not known. One Link Snuffer is that, in an easily affected (susceptible) person, the immune system is triggered to attack the body's own digestive tissue. Crohn's disease runs in families. It seems to be more common in certain geographic areas and amongst certain races. There are no clear-cut dietary causes.  SYMPTOMS  Crohn's disease can cause many different symptoms since it can affect many different parts of the body. Symptoms include:  Fatigue.   Weight loss.   Chronic diarrhea, sometime bloody.   Abdominal pain and cramps.   Fever.   Ulcers or canker sores in the mouth or rectum.   Anemia (low red blood cells).   Arthritis, skin problems, and eye problems may occur.  Complications of Crohn's disease can include:  Series of holes (perforation) of the bowel.   Portions of the intestines sticking to each other (adhesions).   Obstruction of the bowel.   Fistula formation, typically in the rectal area but also in other areas. A fistula is an opening between the bowels and the outside, or between the bowels and another organ.   A painful crack in the mucous membrane of the anus (rectal fissure).  DIAGNOSIS  Your caregiver may suspect Crohn's disease based on your symptoms and an exam. Blood tests may confirm that there is a problem. You may be asked to submit a stool specimen for examination. X-rays and CT scans may be necessary. Ultimately, the diagnosis is usually made after a procedure that uses a flexible tube that is inserted via your mouth or your anus.  This is done under sedation and is called either an upper endoscopy or colonoscopy. With these tests, the specialist can take tiny tissue samples and remove them from the inside of the bowel (biopsy). Examination of this biopsy tissue under a microscope can reveal Crohn's disease as the cause of your symptoms. Due to the many different forms that Crohn's disease can take, symptoms may be present for several years before a diagnosis is made. HOME CARE INSTRUCTIONS   There is no cure for Crohn's disease. The best treatment is frequent checkups with your caregiver.   Symptoms such as diarrhea can be controlled with medications. Avoid foods that have a laxative effect such as fresh fruit, vegetables and dairy products. During flare ups, you can rest your bowel by refraining from solid foods. Drink clear liquids frequently during the day (electrolyte or re-hydrating fluids are best. Your caregiver can help you with suggestions). Drink often to prevent loss of body fluids (dehydration). When diarrhea has cleared, eat small meals and more frequently. Avoid food additives and stimulants such as caffeine (coffee, tea, or chocolate). Enzyme supplements may help if you develop intolerance to a sugar in dairy products (lactose). Ask your caregiver or dietitian about specific dietary instructions.   Try to maintain a positive attitude. Learn relaxation techniques such as self hypnosis, mental imaging, and muscle relaxation.   If possible, avoid stresses which can aggravate your condition.   Exercise regularly.   Follow your diet.   Always get plenty of rest.  SEEK MEDICAL CARE IF:   Your symptoms  fail to improve after a week or two of new treatment.   You experience continued weight loss.   You have ongoing crampy digestion or loose bowels.   You develop a new skin rash, skin sores, or eye problems.  SEEK IMMEDIATE MEDICAL CARE IF:   You have worsening of your symptoms or develop new symptoms.   You  have a fever.   You develop bloody diarrhea.   You develop severe abdominal pain.  MAKE SURE YOU:   Understand these instructions.   Will watch your condition.   Will get help right away if you are not doing well or get worse.  Document Released: 05/04/2005 Document Revised: 07/14/2011 Document Reviewed: 04/02/2007 [facility] Patient Information 2012 [place].

## 2011-11-03 NOTE — ED Provider Notes (Signed)
I saw and evaluated the patient, reviewed the resident's note and I agree with the findings and plan.   .Face to face Exam:  General:  Awake HEENT:  Atraumatic Resp:  Normal effort Abd:  Nondistended Neuro:No focal weakness Lymph: No adenopathy I saw and evaluated the patient, reviewed the resident's note and I agree with the findings and plan.   .Face to face Exam:  General:  Awake HEENT:  Atraumatic Resp:  Normal effort Abd:  Nondistended Neuro:No focal weakness Lymph: No adenopathy   Dot Lanes, MD 11/03/11 [phone]

## 2011-11-08 ENCOUNTER — Telehealth: Payer: Self-pay

## 2011-11-08 NOTE — Telephone Encounter (Signed)
To PCC's -

## 2011-11-08 NOTE — Telephone Encounter (Signed)
[place] [facility] called stating pt is scheduled for lumbar and cervical MRI Thursday 04/04 but because she has Medicaid pre-approval is needed.

## 2011-11-10 ENCOUNTER — Other Ambulatory Visit: Payer: Medicaid Other

## 2011-11-10 ENCOUNTER — Inpatient Hospital Stay: Admission: RE | Admit: 2011-11-10 | Payer: Medicaid Other | Source: Ambulatory Visit

## 2011-11-29 ENCOUNTER — Other Ambulatory Visit: Payer: Self-pay

## 2011-11-29 MED ORDER — AMPHETAMINE-DEXTROAMPHETAMINE 20 MG PO TABS: 20.0000 mg | ORAL_TABLET | Freq: Two times a day (BID) | ORAL | Status: DC

## 2011-11-29 NOTE — Telephone Encounter (Signed)
Called the patient informed prescription requested is ready for pickup at the front desk. Informed of MD's response on MRI.

## 2011-11-29 NOTE — Telephone Encounter (Signed)
Patient requesting refill on adderall.  Also would like to know what to do about MRI?

## 2011-11-29 NOTE — Telephone Encounter (Signed)
Ok for adderall  I have no further suggestions on the MRI

## 2011-12-07 ENCOUNTER — Other Ambulatory Visit: Payer: Self-pay

## 2011-12-07 MED ORDER — ALPRAZOLAM 1 MG PO TABS: 1.0000 mg | ORAL_TABLET | Freq: Three times a day (TID) | ORAL | Status: DC | PRN

## 2011-12-07 NOTE — Telephone Encounter (Signed)
Faxed hardcopy to pharmacy. 

## 2011-12-07 NOTE — Telephone Encounter (Signed)
Done hardcopy to robin  

## 2011-12-09 ENCOUNTER — Ambulatory Visit: Payer: Medicaid Other | Admitting: Internal Medicine

## 2011-12-12 ENCOUNTER — Ambulatory Visit: Payer: Medicaid Other | Admitting: Internal Medicine

## 2011-12-28 ENCOUNTER — Telehealth: Payer: Self-pay

## 2011-12-28 NOTE — Telephone Encounter (Signed)
Called [place] [employer] at [phone] to get PA approval for Nexium 40 mg BID.  Medication has been approved from 12/28/2011 through 12/07/2012.  Called [facility] [address] [facility] informed of approval and called the patient left detailed message on numbers in chart that medication has been approved.

## 2012-01-10 ENCOUNTER — Telehealth: Payer: Self-pay | Admitting: *Deleted

## 2012-01-10 NOTE — Telephone Encounter (Signed)
MD sign fax PA back waiting on approval status... 01/10/12@12 :06pm/LMB

## 2012-01-10 NOTE — Telephone Encounter (Signed)
Received PA place on md desk for signature... 01/10/12@11 :53am/LMB

## 2012-01-10 NOTE — Telephone Encounter (Signed)
Received fax pt needing Pa on celebrex. Notified insurance comp faxing over Pa form to be completed... 01/10/12@11 :00am/LMB

## 2012-01-11 NOTE — Telephone Encounter (Signed)
Received Pa back med ha been approve. Notified pharmacy spoke with Alyse Low gave status,,,, 01/11/12@9 :43am/LMB

## 2012-01-17 ENCOUNTER — Other Ambulatory Visit: Payer: Self-pay

## 2012-01-17 MED ORDER — AMPHETAMINE-DEXTROAMPHETAMINE 20 MG PO TABS: 20.0000 mg | ORAL_TABLET | Freq: Two times a day (BID) | ORAL | Status: DC

## 2012-01-17 NOTE — Telephone Encounter (Signed)
Done hardcopy to robin  

## 2012-01-18 NOTE — Telephone Encounter (Signed)
Called the patient left a detailed message that prescription requested is ready for pickup at the front desk.

## 2012-03-01 ENCOUNTER — Other Ambulatory Visit: Payer: Self-pay | Admitting: Internal Medicine

## 2012-03-01 ENCOUNTER — Other Ambulatory Visit: Payer: Self-pay

## 2012-03-01 MED ORDER — AMPHETAMINE-DEXTROAMPHETAMINE 20 MG PO TABS: 20.0000 mg | ORAL_TABLET | Freq: Two times a day (BID) | ORAL | Status: DC

## 2012-03-01 MED ORDER — ALPRAZOLAM 1 MG PO TABS: 1.0000 mg | ORAL_TABLET | Freq: Three times a day (TID) | ORAL | Status: DC | PRN

## 2012-03-01 NOTE — Telephone Encounter (Signed)
Called the patient left detailed message that prescriptions requested are ready for pickup at the front desk. 

## 2012-03-01 NOTE — Telephone Encounter (Signed)
Done hardcopy to robin  

## 2012-03-28 ENCOUNTER — Ambulatory Visit (INDEPENDENT_AMBULATORY_CARE_PROVIDER_SITE_OTHER): Payer: Self-pay | Admitting: Internal Medicine

## 2012-03-28 ENCOUNTER — Encounter: Payer: Self-pay | Admitting: Internal Medicine

## 2012-03-28 ENCOUNTER — Ambulatory Visit (INDEPENDENT_AMBULATORY_CARE_PROVIDER_SITE_OTHER)
Admission: RE | Admit: 2012-03-28 | Discharge: 2012-03-28 | Disposition: A | Discharge status: Home / Self Care | Payer: Self-pay | Source: Ambulatory Visit | Attending: Internal Medicine | Admitting: Internal Medicine

## 2012-03-28 ENCOUNTER — Telehealth: Payer: Self-pay

## 2012-03-28 VITALS — BP 130/100 | HR 104 | Temp 98.2°F | Ht 63.0 in | Wt 125.1 lb

## 2012-03-28 DIAGNOSIS — F1920 Other psychoactive substance dependence, uncomplicated: Secondary | ICD-10-CM

## 2012-03-28 DIAGNOSIS — M5416 Radiculopathy, lumbar region: Secondary | ICD-10-CM

## 2012-03-28 DIAGNOSIS — H538 Other visual disturbances: Secondary | ICD-10-CM | POA: Insufficient documentation

## 2012-03-28 DIAGNOSIS — M546 Pain in thoracic spine: Secondary | ICD-10-CM

## 2012-03-28 DIAGNOSIS — IMO0002 Reserved for concepts with insufficient information to code with codable children: Secondary | ICD-10-CM

## 2012-03-28 DIAGNOSIS — G8929 Other chronic pain: Secondary | ICD-10-CM

## 2012-03-28 DIAGNOSIS — F909 Attention-deficit hyperactivity disorder, unspecified type: Secondary | ICD-10-CM

## 2012-03-28 DIAGNOSIS — F1120 Opioid dependence, uncomplicated: Secondary | ICD-10-CM

## 2012-03-28 DIAGNOSIS — M549 Dorsalgia, unspecified: Secondary | ICD-10-CM

## 2012-03-28 DIAGNOSIS — J309 Allergic rhinitis, unspecified: Secondary | ICD-10-CM

## 2012-03-28 DIAGNOSIS — H539 Unspecified visual disturbance: Secondary | ICD-10-CM | POA: Insufficient documentation

## 2012-03-28 DIAGNOSIS — M5412 Radiculopathy, cervical region: Secondary | ICD-10-CM

## 2012-03-28 IMAGING — CR DG THORACIC SPINE 2V
3 series · 3 of 3 positions shown · non-contrast
Comparison: [DATE].  CT [DATE].

CLINICAL DATA: History of injury from fall.  Ongoing pain in the
mid thoracic region.

THORACIC SPINE - 2 VIEW

[view not recorded (1 of 3)]
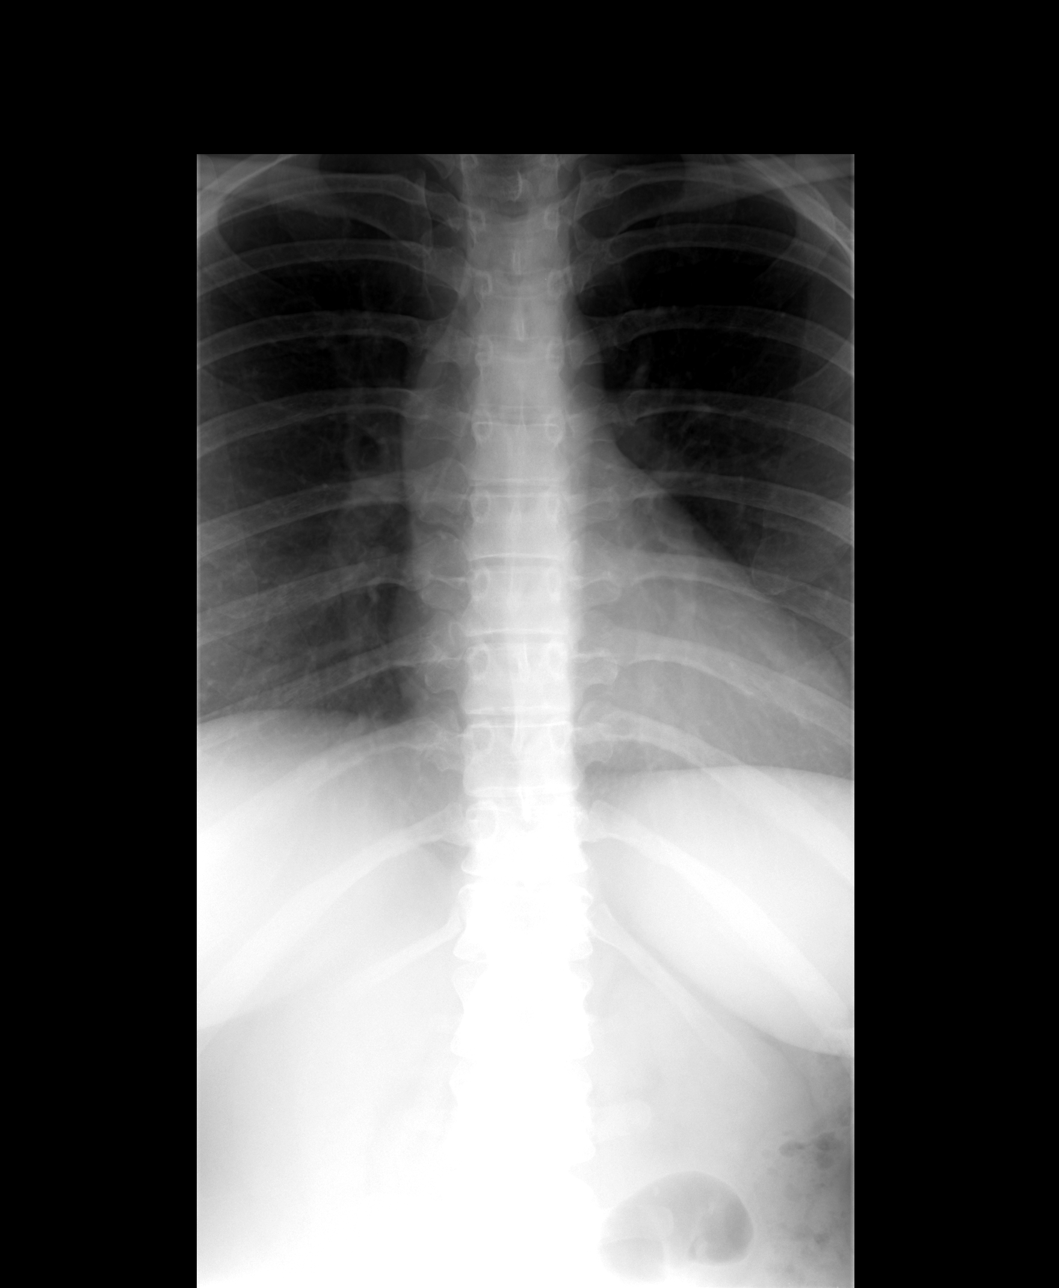

[view not recorded (2 of 3)]
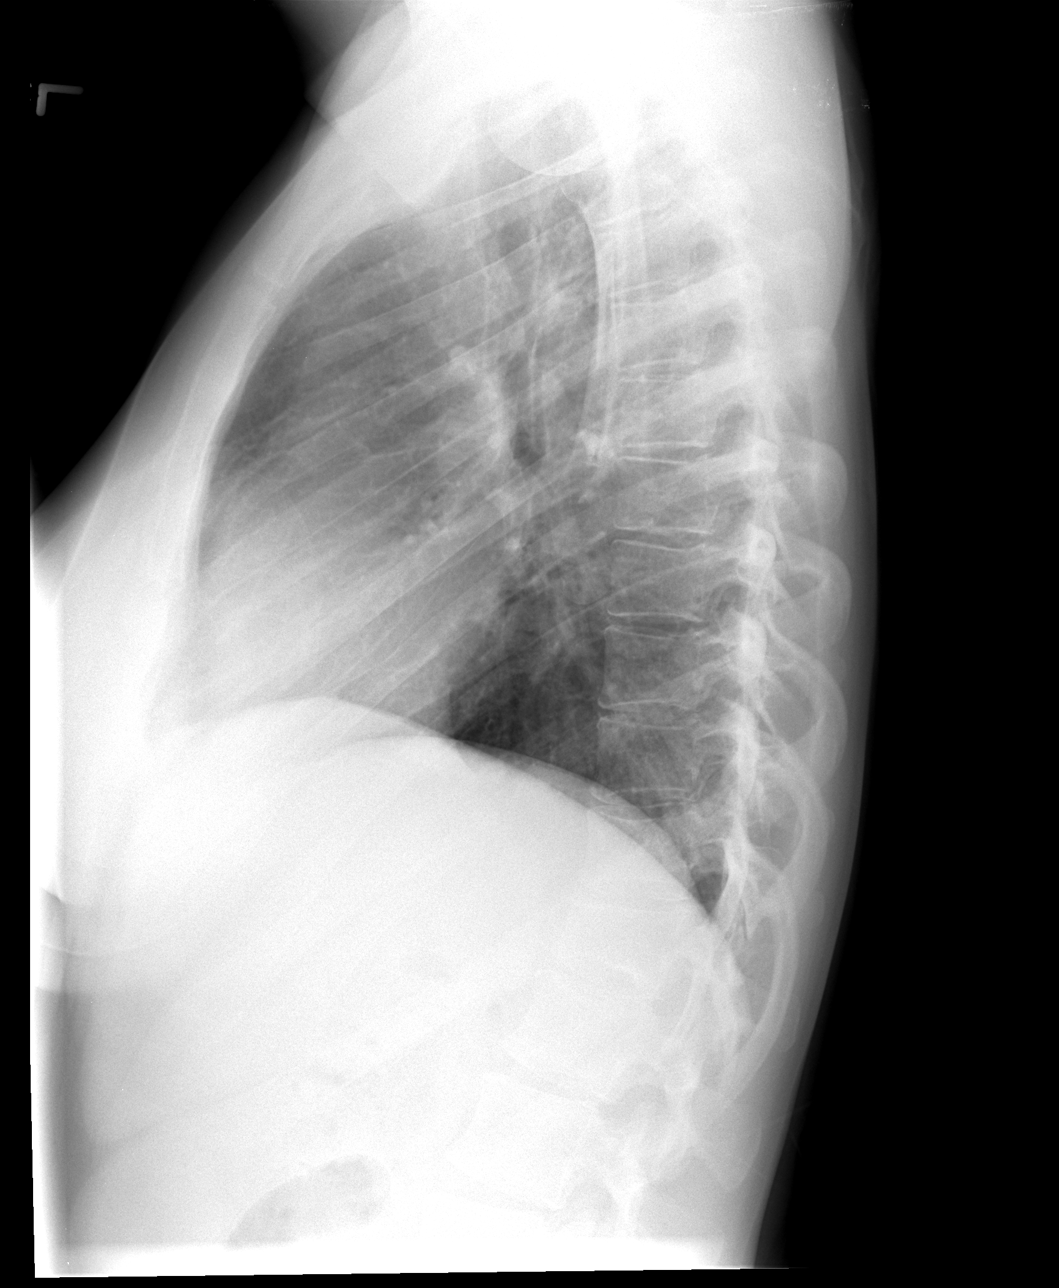

[view not recorded (3 of 3)]
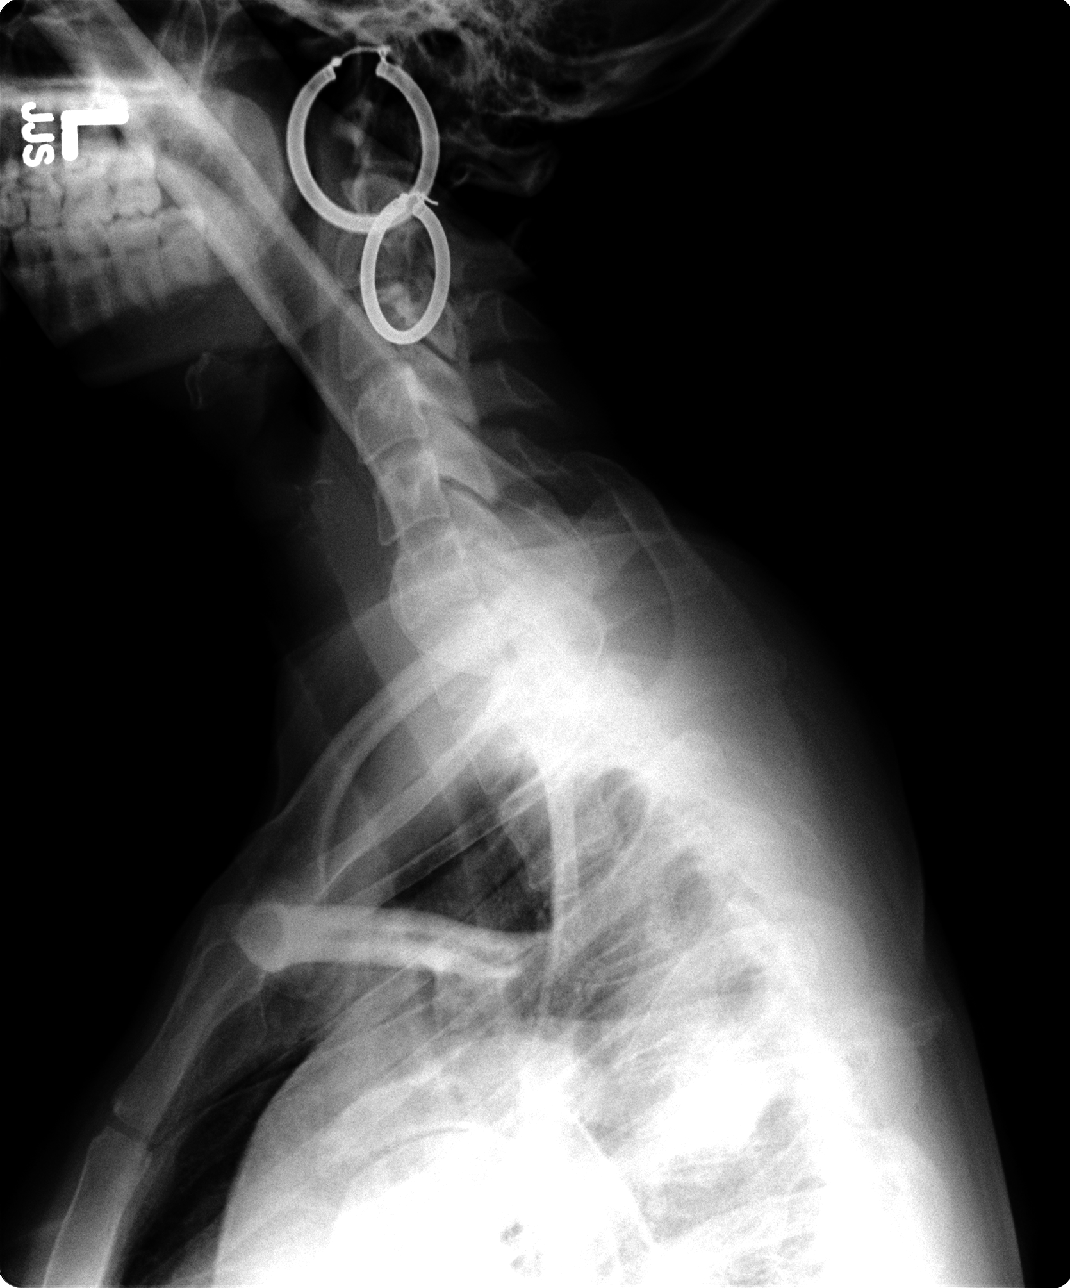

[3 of 3 positions shown; findings below may reference images not displayed]

FINDINGS: Vertebral column is well-aligned.  No subluxation or
dislocation is evident.  There is slight loss of the anterior
height of the T12 vertebral body which is chronic..  No other
evidence of fracture is seen.  No significant spondylosis is
evident.  No bony destruction is seen.
IMPRESSION: Slight chronic loss of anterior height of T12 vertebral body..  No
acute abnormality is evident.

## 2012-03-28 MED ORDER — KETOROLAC TROMETHAMINE 30 MG/ML IJ SOLN
30.0000 mg | Freq: Once | INTRAMUSCULAR | Status: AC
  Administered 2012-03-28: 30 mg via INTRAMUSCULAR

## 2012-03-28 MED ORDER — AMPHETAMINE-DEXTROAMPHETAMINE 20 MG PO TABS: 20.0000 mg | ORAL_TABLET | Freq: Two times a day (BID) | ORAL | Status: DC

## 2012-03-28 MED ORDER — FEXOFENADINE HCL 180 MG PO TABS: 180.0000 mg | ORAL_TABLET | Freq: Every day | ORAL | Status: DC

## 2012-03-28 MED ORDER — AZELASTINE HCL 0.05 % OP SOLN: 1.0000 [drp] | Freq: Two times a day (BID) | OPHTHALMIC | Status: DC

## 2012-03-28 MED ORDER — ESOMEPRAZOLE MAGNESIUM 40 MG PO CPDR: 40.0000 mg | DELAYED_RELEASE_CAPSULE | Freq: Every day | ORAL | Status: DC

## 2012-03-28 MED ORDER — ONDANSETRON HCL 4 MG PO TABS: 4.0000 mg | ORAL_TABLET | Freq: Three times a day (TID) | ORAL | Status: DC | PRN

## 2012-03-28 MED ORDER — CYCLOBENZAPRINE HCL 10 MG PO TABS: 10.0000 mg | ORAL_TABLET | Freq: Three times a day (TID) | ORAL | Status: DC | PRN

## 2012-03-28 MED ORDER — MONTELUKAST SODIUM 10 MG PO TABS: 10.0000 mg | ORAL_TABLET | Freq: Every day | ORAL | Status: DC

## 2012-03-28 MED ORDER — OXYCODONE-ACETAMINOPHEN 10-325 MG PO TABS: 1.0000 | ORAL_TABLET | Freq: Three times a day (TID) | ORAL | Status: DC | PRN

## 2012-03-28 MED ORDER — ESOMEPRAZOLE MAGNESIUM 40 MG PO CPDR: 40.0000 mg | DELAYED_RELEASE_CAPSULE | Freq: Two times a day (BID) | ORAL | Status: DC

## 2012-03-28 NOTE — Telephone Encounter (Signed)
The patient did not received letter at [facility] today for MRI.  Letter can be mailed to her home and/or faxed to [facility] at [phone] please advise

## 2012-03-28 NOTE — Patient Instructions (Addendum)
You had the toradol shot today OK to stop the ibuprofen Take all new medications as prescribed - the oxycodone at three times per day as needed for pain, and the allegra Continue all other medications as before, including the Optivar You will be contacted regarding the referral for: [facility] under the [facility] will be contacted regarding the referral for: MRI for the neck and lower back, and the Opthomology Please go to XRAY in the Basement for the x-ray test  - the thoracic spine xray to look for fracture Your refills were done as requested today, except the xanax is too soon (given #90 with 2 refills July 25) You are given the letter today as requested Please return in 6 months, or sooner if needed

## 2012-03-28 NOTE — Telephone Encounter (Signed)
i did give the letter, but done again to robin

## 2012-03-29 NOTE — Telephone Encounter (Signed)
Faxed letter to [phone] per pt. Request and mailed a copy of the letter to the patient.  Called the patient informed.

## 2012-04-01 ENCOUNTER — Encounter: Payer: Self-pay | Admitting: Internal Medicine

## 2012-04-01 NOTE — Assessment & Plan Note (Signed)
Will try once more to order her MRI, [facility]

## 2012-04-01 NOTE — Assessment & Plan Note (Signed)
Also for t-spine films today

## 2012-04-01 NOTE — Assessment & Plan Note (Signed)
For med refill today 

## 2012-04-01 NOTE — Assessment & Plan Note (Signed)
Will try once more to order her MRI, [facility] 

## 2012-04-01 NOTE — Assessment & Plan Note (Signed)
For toradol IM today, refer to [facility]

## 2012-04-01 NOTE — Assessment & Plan Note (Signed)
For [place] [facility] referral as [facility] , [facility]

## 2012-04-01 NOTE — Progress Notes (Signed)
Subjective:    Patient ID: [name], female    DOB: Nov 20, 1980, 31 y.o.   MRN: [id number]  HPI  Here with persistent c/o neck, mid back and lower back pain as before, now states [facility] and is re-applying to medicaid since this has now lapsed, still has not been able to get imaging done, even after ongoing complaints as per last visit, pain meds, letter written to medicaid  - states was not able to be done due to being out of town/scheduling issue.   Wants everything re-done today, including order for complete MRI spine, c/o bitterly pain to shoulders, neck, upper and mid back, lower back. Needs mult med refilled.  Does have several wks ongoing nasal allergy symptoms with clear congestion, itch and sneeze, without fever, pain, ST, cough or wheezing, but eye itching as well.  Has blurry vision for several yrs as well, asks for optho exam Past Medical History  Diagnosis Date  . VITAMIN B1 DEFICIENCY 09/21/2009  . B12 DEFICIENCY 04/28/2009  . ANXIETY 03/24/2009  . SMOKER 12/02/2009  . ADHD 12/02/2009  . COMMON MIGRAINE 02/05/2010  . HYPERTENSION 03/24/2009  . GERD 03/24/2009  . CROHN'S [facility] INTESTINE 05/19/2009  . Cellulitis and abscess of leg, except foot 02/05/2010  . ECZEMA 05/20/2010  . 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 Cervicalgia 12/02/2009  . Unspecified backache 12/02/2009  . BURSITIS, RIGHT KNEE 02/05/2010  . HEADACHE, CHRONIC 03/24/2009  . Abdominal pain, unspecified site 03/24/2009  . OTITIS MEDIA, LEFT 08/12/2010  . Wheezing 08/12/2010  . Allergic rhinitis, cause unspecified 01/21/2011  . Chronic back pain 01/21/2011  . Chronic neck pain 08/04/2011  . Endometriosis 08/04/2011   Past Surgical History  Procedure Date  . Endometrial ablation 01/2009  . Breast enhancement surgery 2004    reports that she has been smoking Cigarettes.  She has been smoking about .5 packs per day. She has never used smokeless tobacco. She reports that she drinks alcohol. She reports that she  does not use illicit drugs. family history includes Cancer in her maternal grandfather, maternal grandmother, and mother and Clotting disorder in her mother. Allergies  Allergen Reactions  . Penicillins     REACTION: "whelts"   Current Outpatient Prescriptions on File Prior to Visit  Medication Sig Dispense Refill  . ALPRAZolam (XANAX) 1 MG tablet Take 1 tablet (1 mg total) by mouth 3 (three) times daily as needed. For anxiety.  90 tablet  2  . amphetamine-dextroamphetamine (ADDERALL) 20 MG tablet Take 1 tablet (20 mg total) by mouth 2 (two) times daily.  60 tablet  0  . calcium carbonate (OS-CAL) 600 MG TABS Take 600 mg by mouth 2 (two) times daily with a meal.      . cyanocobalamin (,VITAMIN B-12,) 1000 MCG/ML injection Inject 1,000 mcg into the muscle every 30 (thirty) days.       Marland Kitchen esomeprazole (NEXIUM) 40 MG capsule Take 1 capsule (40 mg total) by mouth daily before breakfast.  90 capsule  3  . KLOR-CON M10 10 MEQ tablet TAKE 2 TABLETS BY MOUTH TWICE A DAY  120 tablet  11  . magnesium chloride (SLOW-MAG) 64 MG TBEC Take 1 tablet (64 mg total) by mouth 2 (two) times daily.  180 tablet  3  . promethazine (PHENERGAN) 12.5 MG tablet Take 12.5 mg by mouth every 6 (six) hours as needed. For nausea.      . SUMAtriptan (IMITREX) 100 MG tablet Take 1 tablet (100 mg total) by  mouth 2 (two) times daily as needed for migraine. 1 by mouth every other day as needed  9 tablet  5  . fexofenadine (ALLEGRA) 180 MG tablet Take 1 tablet (180 mg total) by mouth daily.  30 tablet  11  . montelukast (SINGULAIR) 10 MG tablet Take 1 tablet (10 mg total) by mouth at bedtime.  30 tablet  11  . DISCONTD: amLODipine (NORVASC) 10 MG tablet Take 1 tablet (10 mg total) by mouth daily.  90 tablet  3  . DISCONTD: potassium chloride (KLOR-CON) 10 MEQ CR tablet Take 10 mEq by mouth QID.        Marland Kitchen DISCONTD: pregabalin (LYRICA) 50 MG capsule Take 1 capsule (50 mg total) by mouth 3 (three) times daily.  90 capsule  5   Review  of Systems Constitutional: Negative for diaphoresis and unexpected weight change.  HENT: Negative for drooling and tinnitus.   Eyes: Negative for photophobia and visual disturbance.  Respiratory: Negative for choking and stridor.   Gastrointestinal: Negative for vomiting and blood in stool.  Genitourinary: Negative for hematuria and decreased urine volume.  Musculoskeletal: Negative for gait problem.  Skin: Negative for color change and wound.       Objective:   Physical Exam BP 130/100  Pulse 104  Temp 98.2 F (36.8 C) (Oral)  Ht 5' 3"  (1.6 m)  Wt 125 lb 2 oz (56.756 kg)  BMI 22.16 kg/m2  SpO2 98% Physical Exam  VS noted, not ill appearing Constitutional: Pt appears well-developed and well-nourished.  HENT: Head: Normocephalic.  Right Ear: External ear normal.  Left Ear: External ear normal.  Eyes: Conjunctivae and EOM are normal. Pupils are equal, round, and reactive to light.  Neck: Normal range of motion. Neck supple.  Cardiovascular: Normal rate and regular rhythm.   Pulmonary/Chest: Effort normal and breath sounds normal.  Abd:  Soft, NT, non-distended, + BS Neurological: Pt is alert. No cranial nerve deficit. Motor/gait intact MSK:  Diffuse tender to paravertebral neck/thoracic/lumbar  Skin: Skin is warm. No erythema.  Psychiatric: 2+ nervous, mild dysphoric    Assessment & Plan:

## 2012-04-01 NOTE — Assessment & Plan Note (Signed)
To add singulari, Continue all other medications as before

## 2012-04-02 ENCOUNTER — Other Ambulatory Visit: Payer: Self-pay

## 2012-04-02 ENCOUNTER — Telehealth: Payer: Self-pay

## 2012-04-02 DIAGNOSIS — M5412 Radiculopathy, cervical region: Secondary | ICD-10-CM

## 2012-04-02 DIAGNOSIS — M5416 Radiculopathy, lumbar region: Secondary | ICD-10-CM

## 2012-04-02 MED ORDER — ESOMEPRAZOLE MAGNESIUM 20 MG PO CPDR: 20.0000 mg | DELAYED_RELEASE_CAPSULE | Freq: Every day | ORAL | Status: DC

## 2012-04-02 NOTE — Telephone Encounter (Signed)
Patient has not heard about her MRI referral

## 2012-04-02 NOTE — Telephone Encounter (Signed)
Dr Jenny Reichmann this pt was scheduled in April for MRI which she cancelled said she wanted to wait for medicaid  Card. New referral need to be put in that one has been closed.

## 2012-04-02 NOTE — Addendum Note (Signed)
Addended by: Biagio Borg on: 04/02/2012 02:36 PM   Modules accepted: Orders

## 2012-04-02 NOTE — Telephone Encounter (Signed)
done

## 2012-04-02 NOTE — Telephone Encounter (Signed)
The patient is requesting another eye drop sent in as recent one prescribed too expensive.  Also patient needs hardcopy of nexium 20 mg BID (Hardcopy) to find less expensive cost.

## 2012-04-02 NOTE — Telephone Encounter (Signed)
Ok for [employer]  OK to try OTC zaditor as this is very similar to the optivar  I will forward also to [facility] for help with the MRI

## 2012-04-02 NOTE — Telephone Encounter (Signed)
Called the patient informed hardcopy of nexium ready for pickup. INformed of Md instructions on eye drops and MRI appt.

## 2012-04-02 NOTE — Telephone Encounter (Signed)
Too soon - this was included on her pt instructions at last visit:  Your refills were done as requested today, except the xanax is too soon (given #90 with 2 refills July 25)

## 2012-04-03 ENCOUNTER — Telehealth: Payer: Self-pay

## 2012-04-03 MED ORDER — ALPRAZOLAM 1 MG PO TABS: 1.0000 mg | ORAL_TABLET | Freq: Three times a day (TID) | ORAL | Status: DC | PRN

## 2012-04-03 NOTE — Telephone Encounter (Signed)
Reviewed [place] controlled substance registry  The July 25 refill was based on the original rx written may 1  Apparently, she has lost the rx written july 25 for #90 with 2 refills  This is disconcerting that a new rx would be requested and a refill based on a prior rx is filled on the same day  Will need to follow closely, not clear if abuse or diversion is present

## 2012-04-03 NOTE — Telephone Encounter (Signed)
Called left message to call back 

## 2012-04-03 NOTE — Telephone Encounter (Signed)
Fax from pharmacy requesting refill on Ibuprofen 800 mg not currently on medication list.  Please advise

## 2012-04-03 NOTE — Telephone Encounter (Signed)
Called the patient informed of MD instructions on refill.  The patient stated she got her last refill on July 25 and the pharmacy states there are no refills left on the current prescription.  Please advise.

## 2012-04-04 MED ORDER — IBUPROFEN 800 MG PO TABS: 800.0000 mg | ORAL_TABLET | Freq: Three times a day (TID) | ORAL | Status: AC | PRN

## 2012-04-04 NOTE — Addendum Note (Signed)
Addended by: Corwin Levins on: 04/04/2012 12:13 PM   Modules accepted: Orders

## 2012-04-04 NOTE — Telephone Encounter (Signed)
[facility] [address] Per pt. Request.  Patient notified rx has been filled.

## 2012-04-04 NOTE — Telephone Encounter (Signed)
Done erx 

## 2012-04-04 NOTE — Telephone Encounter (Signed)
Called left message to call back 

## 2012-04-12 ENCOUNTER — Telehealth: Payer: Self-pay | Admitting: Internal Medicine

## 2012-04-12 NOTE — Telephone Encounter (Signed)
Patient is scheduled for MRI [facility] for this Saturday and she would like to have an MRI of her neck ordered on the same day as well, says she had spoke with Shirlean Mylar about this previously and thought the order was already placed, please advise  thanks

## 2012-04-12 NOTE — Telephone Encounter (Signed)
Yes, the order was placed  There is no guarantee this can be done on the same day  I will forward to [facility]

## 2012-04-13 NOTE — Telephone Encounter (Signed)
LMOVM for Pt to call back.

## 2012-04-14 ENCOUNTER — Inpatient Hospital Stay (HOSPITAL_COMMUNITY): Admission: RE | Admit: 2012-04-14 | Payer: Self-pay | Source: Ambulatory Visit

## 2012-04-24 ENCOUNTER — Telehealth: Payer: Self-pay | Admitting: Internal Medicine

## 2012-04-24 MED ORDER — OXYCODONE-ACETAMINOPHEN 10-325 MG PO TABS
1.0000 | ORAL_TABLET | Freq: Three times a day (TID) | ORAL | Status: DC | PRN
Start: 2012-04-24 — End: 2013-03-08

## 2012-04-24 NOTE — Telephone Encounter (Signed)
Unfortunately, she has canceled her MRI mult times,   OK  For pain med  She will be dismissed from practice for noncompliance with recommended evalution and treatment

## 2012-04-24 NOTE — Telephone Encounter (Signed)
Patient called informing she canceled her Lumbar MRI as was also to get an MRI of the neck.  Please adivse to reschedule Lumber and Neck MRI.  Please advise on both and refill of percocet she mentioned as well, see previous note

## 2012-04-24 NOTE — Telephone Encounter (Signed)
Pt is requesting to pick up RX for generic Percocet.  It is due to refill around 9/21.  She is going out of town on [place]. And would like to have it with her to refill this weekend.

## 2012-04-24 NOTE — Telephone Encounter (Signed)
Pt informed rx ready for pickup, rx placed upfront.

## 2012-04-25 ENCOUNTER — Telehealth: Payer: Self-pay | Admitting: Internal Medicine

## 2012-04-25 NOTE — Telephone Encounter (Signed)
Dismissal Letter sent by Certified Mail [phone]  Dismissal Letter returned Unclaimed 05/21/2012  Dismissal Letter sent by 1st Class Mail to [address], [employer] on 05/22/2012

## 2012-05-16 ENCOUNTER — Encounter: Payer: Self-pay | Admitting: Internal Medicine

## 2012-05-16 NOTE — Telephone Encounter (Signed)
ERROR

## 2012-05-24 ENCOUNTER — Telehealth: Payer: Self-pay

## 2012-05-24 NOTE — Telephone Encounter (Signed)
No further refills from me;  Pt was dismissed effective sept 17

## 2012-05-24 NOTE — Telephone Encounter (Signed)
Patient called today requesting refill on her Adderall and oxycodone.  She informed she was aware she has been dismissed.  Please advise on refills

## 2012-07-18 ENCOUNTER — Telehealth: Payer: Self-pay | Admitting: Internal Medicine

## 2012-07-19 NOTE — Telephone Encounter (Signed)
Pt is aware per Dr. Jenny Reichmann that her new doctor will need to take care of all refill and medication requests.

## 2013-03-08 ENCOUNTER — Ambulatory Visit (INDEPENDENT_AMBULATORY_CARE_PROVIDER_SITE_OTHER): Payer: Self-pay | Admitting: Family Medicine

## 2013-03-08 ENCOUNTER — Encounter: Payer: Self-pay | Admitting: Family Medicine

## 2013-03-08 VITALS — BP 146/110 | HR 98 | Temp 99.5°F | Resp 16 | Ht 61.0 in | Wt 116.0 lb

## 2013-03-08 DIAGNOSIS — G8929 Other chronic pain: Secondary | ICD-10-CM

## 2013-03-08 DIAGNOSIS — M545 Low back pain, unspecified: Secondary | ICD-10-CM

## 2013-03-08 DIAGNOSIS — M5416 Radiculopathy, lumbar region: Secondary | ICD-10-CM

## 2013-03-08 DIAGNOSIS — M542 Cervicalgia: Secondary | ICD-10-CM

## 2013-03-08 DIAGNOSIS — R000 Tachycardia, unspecified: Secondary | ICD-10-CM

## 2013-03-08 DIAGNOSIS — IMO0002 Reserved for concepts with insufficient information to code with codable children: Secondary | ICD-10-CM

## 2013-03-08 NOTE — Progress Notes (Signed)
Subjective:    Patient ID: [name], female    DOB: [birth year], 32 y.o.   MRN: [id number]  HPI Patient was last seen in this clinic in 2009. She returns today to establish care. She was dismissed by Dr. Jonny Ruiz in 07/2012 due to her failure to get the MRI to evaluate the pain in her back. She has also been dismissed by [facility] gastroenterology who was managing her ulcerative colitis. She is very reticent to give me the reasons why she was dismissed.  She reports pain in her neck, thoracic spine, and lumbar spine. She reports burning and stinging pains radiating into both arms and both legs. She reports weakness in the legs.  She is also complaining of anxiety and requesting a refill on her Xanax and she also has ADHD per her report and is requesting a refill on her Adderall.    A [facility] drug registry search shows no refills control substances in the last 6 months.  Her pharmacy denies any refills in the last 6 months. She denies any polysubstance abuse or alcohol abuse. Past Medical History  Diagnosis Date  . VITAMIN B1 DEFICIENCY 09/21/2009  . B12 DEFICIENCY 04/28/2009  . ANXIETY 03/24/2009  . SMOKER 12/02/2009  . ADHD 12/02/2009  . COMMON MIGRAINE 02/05/2010  . HYPERTENSION 03/24/2009  . GERD 03/24/2009  . CROHN'S [name] 05/19/2009  . Cellulitis and abscess of leg, except foot 02/05/2010  . ECZEMA 05/20/2010  . 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 Cervicalgia 12/02/2009  . Unspecified backache 12/02/2009  . BURSITIS, RIGHT KNEE 02/05/2010  . HEADACHE, CHRONIC 03/24/2009  . Abdominal pain, unspecified site 03/24/2009  . OTITIS MEDIA, LEFT 08/12/2010  . Wheezing 08/12/2010  . Allergic rhinitis, cause unspecified 01/21/2011  . Chronic back pain 01/21/2011  . Chronic neck pain 08/04/2011  . Endometriosis 08/04/2011   Current Outpatient Prescriptions on File Prior to Visit  Medication Sig Dispense Refill  . ALPRAZolam (XANAX) 1 MG tablet Take 1 tablet (1 mg total) by mouth 3 (three)  times daily as needed. For anxiety.  90 tablet  2  . amphetamine-dextroamphetamine (ADDERALL) 20 MG tablet Take 1 tablet (20 mg total) by mouth 2 (two) times daily.  60 tablet  0  . cyclobenzaprine (FLEXERIL) 10 MG tablet Take 1 tablet (10 mg total) by mouth 3 (three) times daily as needed.  60 tablet  6  . esomeprazole (NEXIUM) 20 MG capsule Take 1 capsule (20 mg total) by mouth daily before breakfast.  180 capsule  3  . fexofenadine (ALLEGRA) 180 MG tablet Take 1 tablet (180 mg total) by mouth daily.  30 tablet  11  . montelukast (SINGULAIR) 10 MG tablet Take 1 tablet (10 mg total) by mouth at bedtime.  30 tablet  11  . SUMAtriptan (IMITREX) 100 MG tablet Take 1 tablet (100 mg total) by mouth 2 (two) times daily as needed for migraine. 1 by mouth every other day as needed  9 tablet  5  . calcium carbonate (OS-CAL) 600 MG TABS Take 600 mg by mouth 2 (two) times daily with a meal.      . cyanocobalamin (,VITAMIN B-12,) 1000 MCG/ML injection Inject 1,000 mcg into the muscle every 30 (thirty) days.       . magnesium chloride (SLOW-MAG) 64 MG TBEC Take 1 tablet (64 mg total) by mouth 2 (two) times daily.  180 tablet  3  . [DISCONTINUED] amLODipine (NORVASC) 10 MG tablet Take 1 tablet (10 mg total) by mouth daily.  90 tablet  3  . [DISCONTINUED] potassium chloride (KLOR-CON) 10 MEQ CR tablet Take 10 mEq by mouth QID.        . [DISCONTINUED] pregabalin (LYRICA) 50 MG capsule Take 1 capsule (50 mg total) by mouth 3 (three) times daily.  90 capsule  5   No current facility-administered medications on file prior to visit.   Allergies  Allergen Reactions  . Penicillins     REACTION: "whelts"   History   Social History  . Marital Status: Single    Spouse Name: N/A    Number of Children: 1  . Years of Education: N/A   Occupational History  . social security disability at [employer]    Social History Main Topics  . Smoking status: Current Every Day Smoker -- 0.50 packs/day    Types: Cigarettes  .  Smokeless tobacco: Never Used     Comment: 5 daily  . Alcohol Use: Yes     Comment: rare  . Drug Use: No  . Sexually Active: No   Other Topics Concern  . Not on file   Social History Narrative   Daily caffeine 1 cup   Patient does not get regular exercise   Lives with Gregor Hams and baby son born 11/2010   Family History  Problem Relation Age of Onset  . Cancer Mother     breast  . Clotting disorder Mother   . Cancer Maternal Grandmother     Stomach Cancer  . Cancer Maternal Grandfather     Esophageal Cancer     Review of Systems  All other systems reviewed and are negative.       Objective:   Physical Exam  Vitals reviewed. Constitutional: She is oriented to person, place, and time. She appears well-developed and well-nourished.  Neck: Neck supple. No thyromegaly present.  Cardiovascular: Regular rhythm, normal heart sounds and intact distal pulses.  Tachycardia present.  Exam reveals no gallop and no friction rub.   No murmur heard. Pulmonary/Chest: Effort normal and breath sounds normal. No respiratory distress. She has no wheezes. She has no rales. She exhibits no tenderness.  Abdominal: Soft. Bowel sounds are normal. She exhibits no distension and no mass. There is tenderness. There is rebound and guarding.  Musculoskeletal:       Lumbar back: She exhibits decreased range of motion and tenderness. She exhibits no edema, no deformity and no spasm.  Lymphadenopathy:    She has no cervical adenopathy.  Neurological: She is alert and oriented to person, place, and time. She has normal reflexes. She displays normal reflexes. No cranial nerve deficit. She exhibits normal muscle tone. Coordination normal.   muscle strength is 5 over 5 equal and symmetric in both legs. She has normal reflexes in both legs at the knee and at the ankle. She has a negative straight-leg raise. There is no evidence of lumbar radiculopathy on exam        Assessment & Plan:  1. Chronic neck  pain - MR Cervical Spine Wo Contrast; Future  2. Low back pain - MR Lumbar Spine Wo Contrast; Future  3. Radiculopathy of lumbar region - MR Lumbar Spine Wo Contrast; Future  I explained to the patient that I was very concerned by the medicine she was taking.  She was on multiple addictive medications including narcotics, benzodiazepines, and stimulants. I refused to refill any of this medication. My concern is that she has been dismissed from 2 medical offices without a reasonable explanation. I am concerned  about substance abuse. In good faith I will begin a medical workup to try to determine the cause of her pain.  Given the fact she has radicular symptoms in both arms and legs, her pain does not fit a classic physiologic profile. I will obtain an MRI of the cervical spine as well as the lumbar spine and I will not refill any pain medications nor provide any medications at all until I see the results of the MRI. She has previously canceled these MRIs on multiple occasions. I have a difficult time understanding how someone in such severe pain would not want to proceed with the imaging test that could shed light on its cause.  Therefore, I will wait results of MRI before determining her course of care. Current for any treatment of her anxiety disorder and her ADHD until I determine the cause of her back pain. I requested blood work today given her elevated blood pressure as well as her tachycardia. She refused. She states that she will come back to get the blood work that she needed to go she was in a hurry.

## 2013-03-30 ENCOUNTER — Other Ambulatory Visit: Payer: Self-pay | Admitting: Internal Medicine

## 2013-08-08 HISTORY — PX: STOMACH SURGERY: SHX791

## 2013-08-08 HISTORY — PX: RECTAL PROLAPSE REPAIR: SHX759

## 2013-08-08 HISTORY — PX: COLON SURGERY: SHX602

## 2013-10-09 ENCOUNTER — Inpatient Hospital Stay (HOSPITAL_COMMUNITY)
Admission: EM | Admit: 2013-10-09 | Discharge: 2013-10-11 | DRG: 386 | Disposition: A | Discharge status: Home / Self Care | Payer: Medicaid Other | Attending: Internal Medicine | Admitting: Internal Medicine

## 2013-10-09 ENCOUNTER — Encounter (HOSPITAL_COMMUNITY): Payer: Self-pay | Admitting: Radiology

## 2013-10-09 ENCOUNTER — Emergency Department (HOSPITAL_COMMUNITY): Payer: Medicaid Other

## 2013-10-09 DIAGNOSIS — M2550 Pain in unspecified joint: Secondary | ICD-10-CM

## 2013-10-09 DIAGNOSIS — F411 Generalized anxiety disorder: Secondary | ICD-10-CM

## 2013-10-09 DIAGNOSIS — Z0000 Encounter for general adult medical examination without abnormal findings: Secondary | ICD-10-CM

## 2013-10-09 DIAGNOSIS — M5416 Radiculopathy, lumbar region: Secondary | ICD-10-CM

## 2013-10-09 DIAGNOSIS — F1120 Opioid dependence, uncomplicated: Secondary | ICD-10-CM

## 2013-10-09 DIAGNOSIS — G8929 Other chronic pain: Secondary | ICD-10-CM | POA: Diagnosis present

## 2013-10-09 DIAGNOSIS — Z7982 Long term (current) use of aspirin: Secondary | ICD-10-CM

## 2013-10-09 DIAGNOSIS — K219 Gastro-esophageal reflux disease without esophagitis: Secondary | ICD-10-CM

## 2013-10-09 DIAGNOSIS — R51 Headache: Secondary | ICD-10-CM

## 2013-10-09 DIAGNOSIS — I10 Essential (primary) hypertension: Secondary | ICD-10-CM | POA: Diagnosis present

## 2013-10-09 DIAGNOSIS — K5000 Crohn's disease of small intestine without complications: Principal | ICD-10-CM | POA: Diagnosis present

## 2013-10-09 DIAGNOSIS — Z79899 Other long term (current) drug therapy: Secondary | ICD-10-CM

## 2013-10-09 DIAGNOSIS — R1084 Generalized abdominal pain: Secondary | ICD-10-CM

## 2013-10-09 DIAGNOSIS — M25511 Pain in right shoulder: Secondary | ICD-10-CM

## 2013-10-09 DIAGNOSIS — L719 Rosacea, unspecified: Secondary | ICD-10-CM

## 2013-10-09 DIAGNOSIS — H538 Other visual disturbances: Secondary | ICD-10-CM

## 2013-10-09 DIAGNOSIS — K623 Rectal prolapse: Secondary | ICD-10-CM | POA: Diagnosis present

## 2013-10-09 DIAGNOSIS — E538 Deficiency of other specified B group vitamins: Secondary | ICD-10-CM

## 2013-10-09 DIAGNOSIS — E876 Hypokalemia: Secondary | ICD-10-CM | POA: Diagnosis present

## 2013-10-09 DIAGNOSIS — L259 Unspecified contact dermatitis, unspecified cause: Secondary | ICD-10-CM

## 2013-10-09 DIAGNOSIS — K5010 Crohn's disease of large intestine without complications: Secondary | ICD-10-CM

## 2013-10-09 DIAGNOSIS — M5412 Radiculopathy, cervical region: Secondary | ICD-10-CM

## 2013-10-09 DIAGNOSIS — M25512 Pain in left shoulder: Secondary | ICD-10-CM

## 2013-10-09 DIAGNOSIS — F17200 Nicotine dependence, unspecified, uncomplicated: Secondary | ICD-10-CM | POA: Diagnosis present

## 2013-10-09 DIAGNOSIS — K625 Hemorrhage of anus and rectum: Secondary | ICD-10-CM | POA: Diagnosis present

## 2013-10-09 DIAGNOSIS — M542 Cervicalgia: Secondary | ICD-10-CM

## 2013-10-09 DIAGNOSIS — M546 Pain in thoracic spine: Secondary | ICD-10-CM

## 2013-10-09 DIAGNOSIS — R109 Unspecified abdominal pain: Secondary | ICD-10-CM

## 2013-10-09 DIAGNOSIS — J309 Allergic rhinitis, unspecified: Secondary | ICD-10-CM

## 2013-10-09 DIAGNOSIS — M5410 Radiculopathy, site unspecified: Secondary | ICD-10-CM

## 2013-10-09 DIAGNOSIS — Z23 Encounter for immunization: Secondary | ICD-10-CM

## 2013-10-09 DIAGNOSIS — M549 Dorsalgia, unspecified: Secondary | ICD-10-CM | POA: Diagnosis present

## 2013-10-09 DIAGNOSIS — F909 Attention-deficit hyperactivity disorder, unspecified type: Secondary | ICD-10-CM | POA: Diagnosis present

## 2013-10-09 DIAGNOSIS — G43009 Migraine without aura, not intractable, without status migrainosus: Secondary | ICD-10-CM

## 2013-10-09 HISTORY — DX: Fibromyalgia: M79.7

## 2013-10-09 HISTORY — DX: Unspecified osteoarthritis, unspecified site: M19.90

## 2013-10-09 LAB — COMPREHENSIVE METABOLIC PANEL
ALT: 6 U/L (ref 0–35)
AST: 13 U/L (ref 0–37)
Albumin: 3 g/dL — ABNORMAL LOW (ref 3.5–5.2)
Alkaline Phosphatase: 83 U/L (ref 39–117)
BUN: 9 mg/dL (ref 6–23)
CO2: 22 mEq/L (ref 19–32)
Calcium: 8.2 mg/dL — ABNORMAL LOW (ref 8.4–10.5)
Chloride: 98 mEq/L (ref 96–112)
Creatinine, Ser: 0.87 mg/dL (ref 0.50–1.10)
GFR calc Af Amer: >90 mL/min (ref >90)
GFR calc non Af Amer: 87 mL/min — ABNORMAL LOW (ref >90)
Glucose, Bld: 186 mg/dL — ABNORMAL HIGH (ref 70–99)
Potassium: 2.8 mEq/L — CL (ref 3.7–5.3)
Sodium: 139 mEq/L (ref 137–147)
Total Bilirubin: 0.4 mg/dL (ref 0.3–1.2)
Total Protein: 6.3 g/dL (ref 6.0–8.3)

## 2013-10-09 LAB — CBC
HCT: 42.7 % (ref 36.0–46.0)
Hemoglobin: 15.2 g/dL — ABNORMAL HIGH (ref 12.0–15.0)
MCH: 31.9 pg (ref 26.0–34.0)
MCHC: 35.6 g/dL (ref 30.0–36.0)
MCV: 89.7 fL (ref 78.0–100.0)
Platelets: 422 10*3/uL — ABNORMAL HIGH (ref 150–400)
RBC: 4.76 MIL/uL (ref 3.87–5.11)
RDW: 15.5 % (ref 11.5–15.5)
WBC: 12.4 10*3/uL — ABNORMAL HIGH (ref 4.0–10.5)

## 2013-10-09 LAB — ABO/RH: ABO/RH(D): O POS

## 2013-10-09 LAB — TYPE AND SCREEN
ABO/RH(D): O POS
Antibody Screen: NEGATIVE

## 2013-10-09 LAB — POC URINE PREG, ED: Preg Test, Ur: NEGATIVE

## 2013-10-09 IMAGING — CT CT ABD-PELV W/ CM
2 of 4 series · 16 of 46 positions shown, 18 images · IV contrast (APPLIED)
Comparison: CT abdomen and pelvis [DATE].

CLINICAL DATA: The progressive rectal bleeding. History of Crohn's
disease and ulcerative colitis.

EXAM:
CT ABDOMEN AND PELVIS WITH CONTRAST
TECHNIQUE: Multidetector CT imaging of the abdomen and pelvis was performed
using the standard protocol following bolus administration of
intravenous contrast.
CONTRAST:  100mL OMNIPAQUE IOHEXOL 300 MG/ML  SOLN

[Series 2: abd/ pelvis 5.0 i30f 1 · axial · 0.64mm/px · z∈[+834,+1264]mm · 13 of 94 slices shown, 15 images]
[im 4/94  soft-tissue]
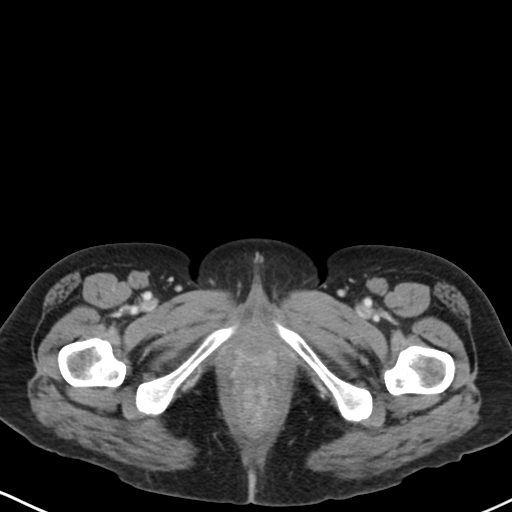
[im 4/94  bone]
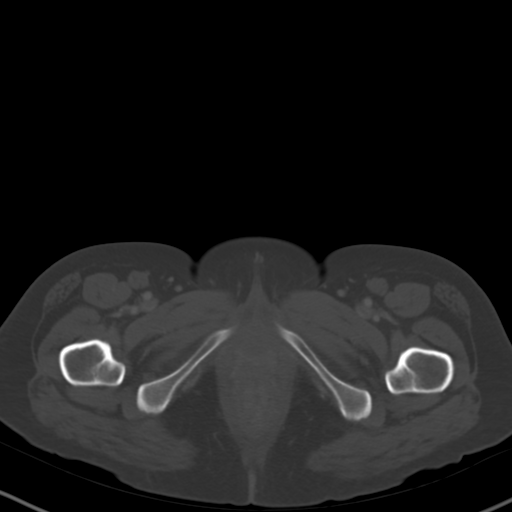
[im 12/94  soft-tissue]
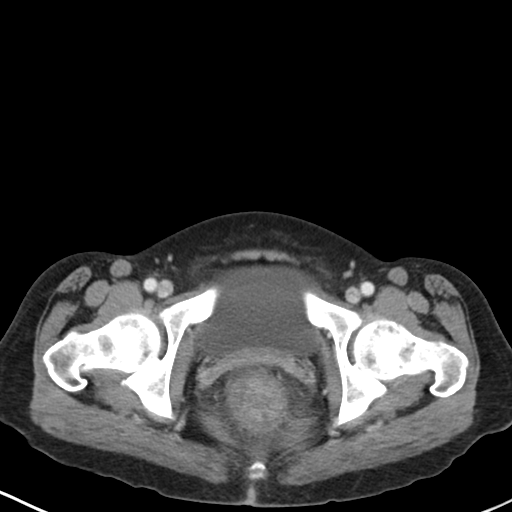
[im 20/94  soft-tissue]
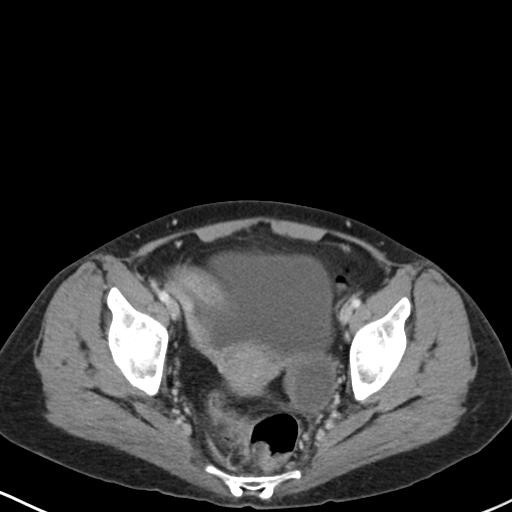
[im 28/94  soft-tissue]
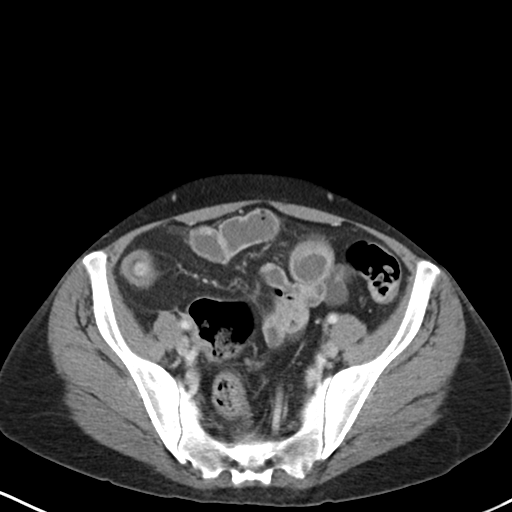
[im 32/94  soft-tissue]
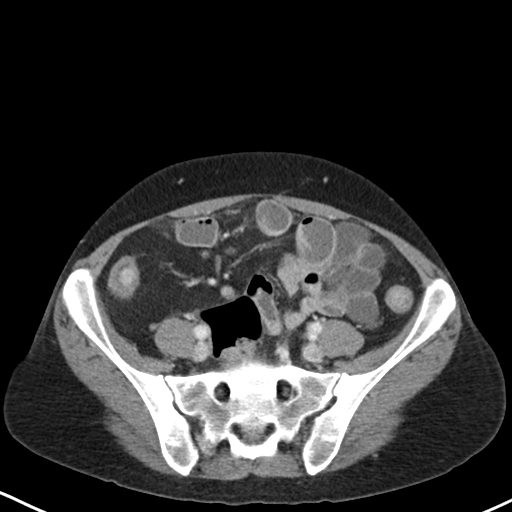
[im 39/94  soft-tissue]
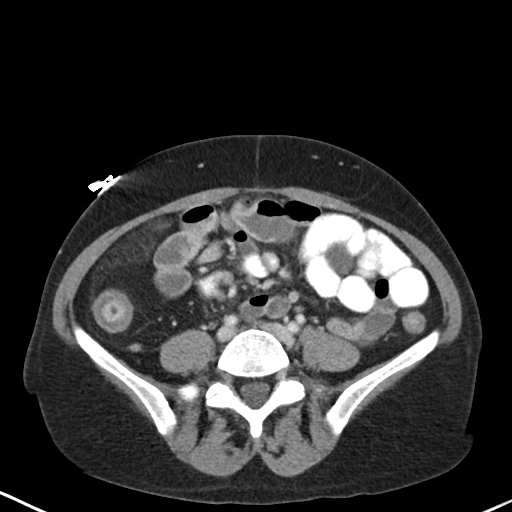
[im 47/94  soft-tissue]
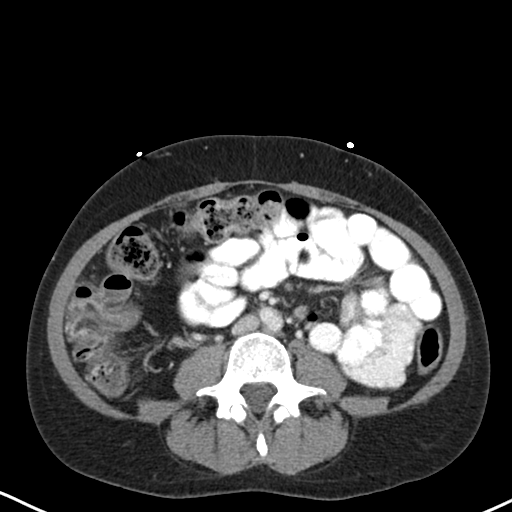
[im 55/94  soft-tissue]
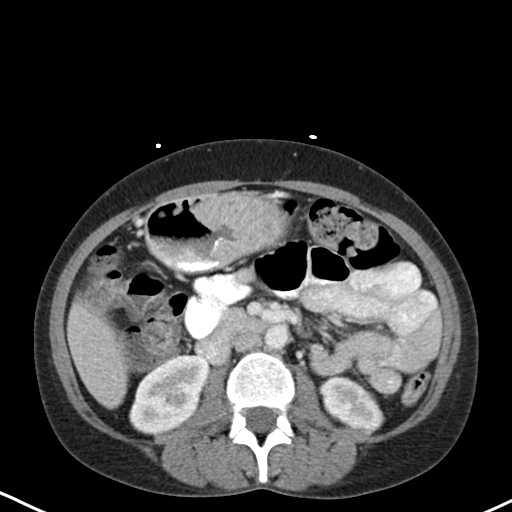
[im 63/94  soft-tissue]
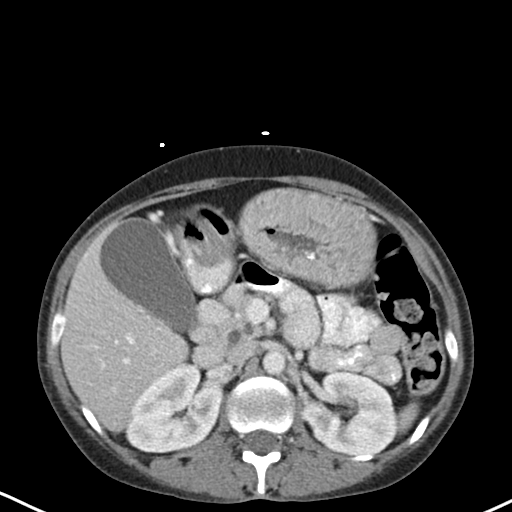
[im 63/94  bone]
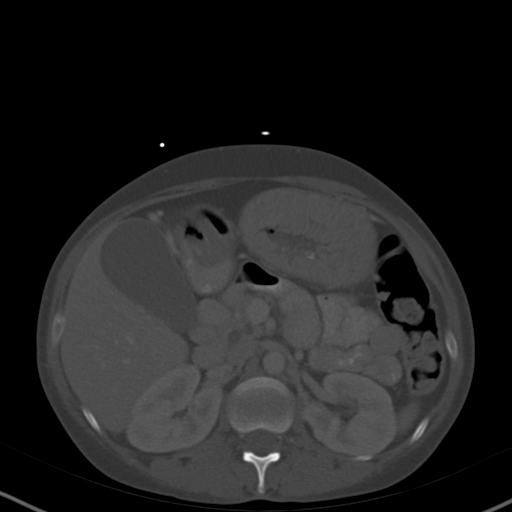
[im 66/94  soft-tissue]
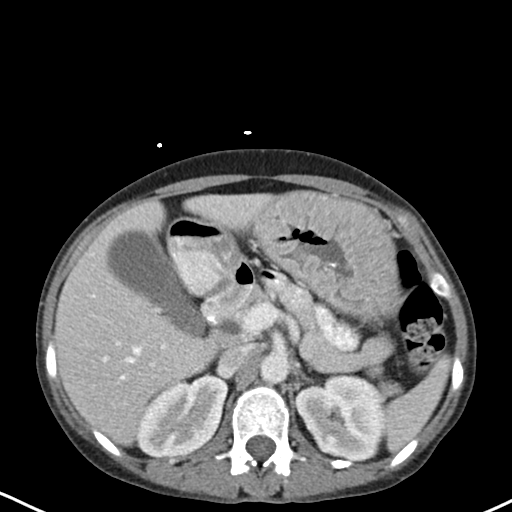
[im 74/94  soft-tissue]
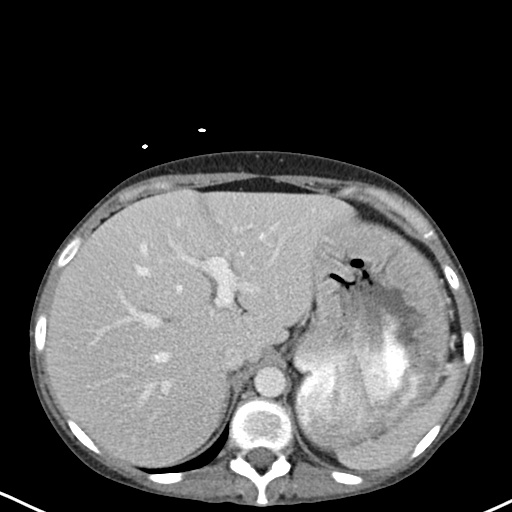
[im 82/94  soft-tissue]
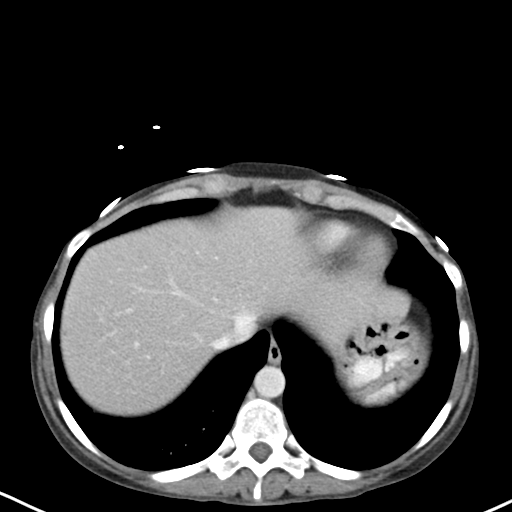
[im 90/94  soft-tissue]
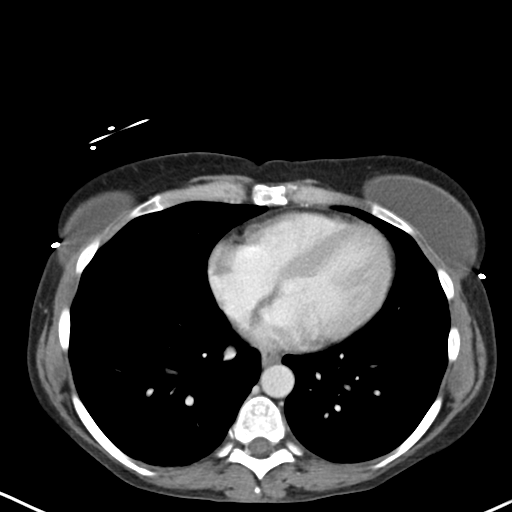

[Series 5: coronal soft tissue · coronal · 0.70mm/px · 3 of 68 slices shown]
[im 23/68  soft-tissue]
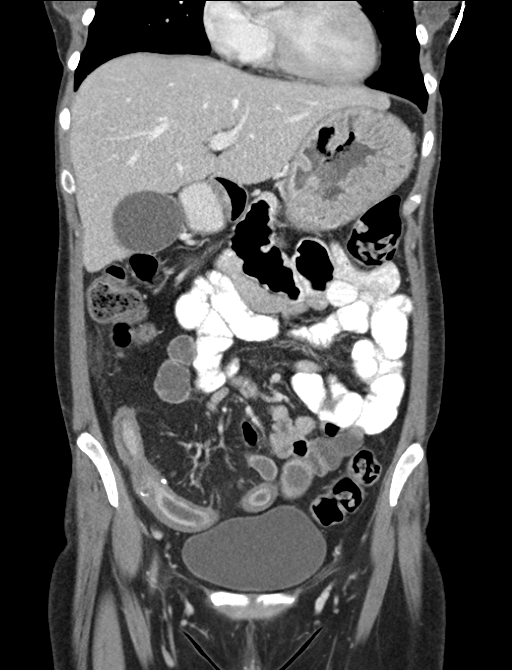
[im 30/68  soft-tissue]
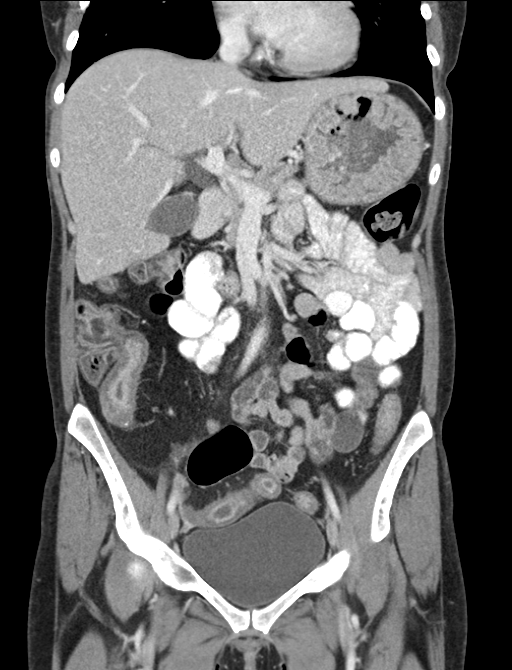
[im 38/68  soft-tissue]
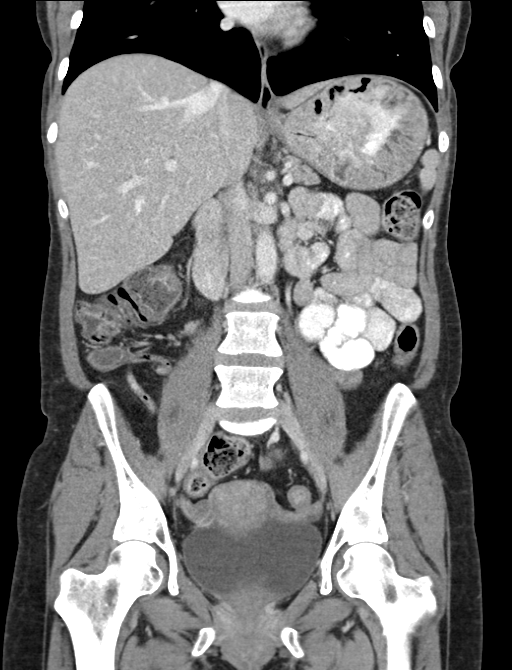

[16 of 46 positions shown; findings below may reference images not displayed]

FINDINGS: Bilateral breast implants are present. The heart size is normal. The
lungs are clear.

The liver and spleen are within normal limits. The stomach,
duodenum, and pancreas are within normal limits. The common bile
duct is dilated, measuring 8.5 mm at pancreatic head. No obstructing
lesion is present. The gallbladder is within normal limits. The
adrenal glands and kidneys are normal bilaterally. The ureters are
unremarkable.

Marked mucosal thickening and narrowing is present within the
terminal ileum. The anastomosis is intact. There is no obstruction.
Mild edematous changes are present at the rectum. The sigmoid colon
is within normal limits. The descending, transverse, and distal
ascending colon is normal. There is some inflammatory change about
the cecum. The appendix is visualized and normal. The more proximal
small bowel is unremarkable.

The bone windows are unremarkable.
IMPRESSION: 1. Extensive mucosal thickening within the terminal ileum without
evidence for obstruction. This is compatible with terminal ileitis
and consistent with the given diagnosis of Crohn's disease.
2. Mild inflammatory changes about the cecum. The remainder of the
colon is unremarkable.
3. The inflammatory changes within the rectum.
4. Chronic dilation of the common bile duct without evidence for an
obstructing lesion.

## 2013-10-09 MED ORDER — MORPHINE SULFATE 2 MG/ML IJ SOLN: 1.0000 mg | INTRAMUSCULAR | Status: DC | PRN

## 2013-10-09 MED ORDER — POTASSIUM CHLORIDE CRYS ER 20 MEQ PO TBCR
40.0000 meq | EXTENDED_RELEASE_TABLET | Freq: Once | ORAL | Status: AC
  Administered 2013-10-09: 40 meq via ORAL
  Filled 2013-10-09: qty 2

## 2013-10-09 MED ORDER — ETOMIDATE 2 MG/ML IV SOLN
INTRAVENOUS | Status: AC
  Filled 2013-10-09: qty 10

## 2013-10-09 MED ORDER — ONDANSETRON HCL 4 MG PO TABS: 4.0000 mg | ORAL_TABLET | Freq: Four times a day (QID) | ORAL | Status: DC | PRN

## 2013-10-09 MED ORDER — POTASSIUM CHLORIDE 10 MEQ/100ML IV SOLN
10.0000 meq | INTRAVENOUS | Status: AC
  Administered 2013-10-10 (×3): 10 meq via INTRAVENOUS
  Filled 2013-10-09 (×3): qty 100

## 2013-10-09 MED ORDER — SODIUM CHLORIDE 0.9 % IJ SOLN
3.0000 mL | Freq: Two times a day (BID) | INTRAMUSCULAR | Status: DC
  Administered 2013-10-10 – 2013-10-11 (×4): 3 mL via INTRAVENOUS

## 2013-10-09 MED ORDER — METHYLPREDNISOLONE SODIUM SUCC 125 MG IJ SOLR
125.0000 mg | Freq: Once | INTRAMUSCULAR | Status: AC
  Administered 2013-10-09: 125 mg via INTRAVENOUS
  Filled 2013-10-09: qty 2

## 2013-10-09 MED ORDER — ACETAMINOPHEN 650 MG RE SUPP: 650.0000 mg | Freq: Four times a day (QID) | RECTAL | Status: DC | PRN

## 2013-10-09 MED ORDER — ETOMIDATE 2 MG/ML IV SOLN: 0.2000 mg/kg | Freq: Once | INTRAVENOUS | Status: AC

## 2013-10-09 MED ORDER — HYDROMORPHONE HCL PF 1 MG/ML IJ SOLN
1.0000 mg | Freq: Once | INTRAMUSCULAR | Status: AC
  Administered 2013-10-09: 1 mg via INTRAVENOUS
  Filled 2013-10-09: qty 1

## 2013-10-09 MED ORDER — FENTANYL CITRATE 0.05 MG/ML IJ SOLN
50.0000 ug | Freq: Once | INTRAMUSCULAR | Status: AC
  Administered 2013-10-09: 50 ug via INTRAVENOUS

## 2013-10-09 MED ORDER — IOHEXOL 300 MG/ML  SOLN
100.0000 mL | Freq: Once | INTRAMUSCULAR | Status: AC | PRN
  Administered 2013-10-09: 100 mL via INTRAVENOUS

## 2013-10-09 MED ORDER — POTASSIUM CHLORIDE IN NACL 20-0.9 MEQ/L-% IV SOLN
INTRAVENOUS | Status: AC
  Administered 2013-10-10 (×2): via INTRAVENOUS
  Filled 2013-10-09 (×3): qty 1000

## 2013-10-09 MED ORDER — FENTANYL CITRATE 0.05 MG/ML IJ SOLN
INTRAMUSCULAR | Status: AC
  Filled 2013-10-09: qty 2

## 2013-10-09 MED ORDER — IOHEXOL 300 MG/ML  SOLN
25.0000 mL | Freq: Once | INTRAMUSCULAR | Status: AC | PRN
  Administered 2013-10-09: 25 mL via ORAL

## 2013-10-09 MED ORDER — INFLUENZA VAC SPLIT QUAD 0.5 ML IM SUSP: 0.5000 mL | INTRAMUSCULAR | Status: DC

## 2013-10-09 MED ORDER — ETOMIDATE 2 MG/ML IV SOLN
INTRAVENOUS | Status: AC | PRN
  Administered 2013-10-09: 11 mg via INTRAVENOUS

## 2013-10-09 MED ORDER — MORPHINE SULFATE 4 MG/ML IJ SOLN
6.0000 mg | Freq: Once | INTRAMUSCULAR | Status: AC
  Administered 2013-10-09: 6 mg via INTRAVENOUS
  Filled 2013-10-09: qty 2

## 2013-10-09 MED ORDER — SODIUM CHLORIDE 0.9 % IV SOLN
INTRAVENOUS | Status: AC | PRN
  Administered 2013-10-09: 100 mL/h via INTRAVENOUS

## 2013-10-09 MED ORDER — ONDANSETRON HCL 4 MG/2ML IJ SOLN: 4.0000 mg | Freq: Four times a day (QID) | INTRAMUSCULAR | Status: DC | PRN

## 2013-10-09 MED ORDER — ACETAMINOPHEN 325 MG PO TABS: 650.0000 mg | ORAL_TABLET | Freq: Four times a day (QID) | ORAL | Status: DC | PRN

## 2013-10-09 NOTE — Sedation Documentation (Signed)
Vital signs stable. 

## 2013-10-09 NOTE — ED Provider Notes (Signed)
CSN: [id number]     Arrival date & time 10/09/13  1631 History   First MD Initiated Contact with Patient 10/09/13 1747     Chief Complaint  Patient presents with  . Rectal Bleeding      The history is provided by the patient.   Agent presents the emergency department with lower abdominal discomfort nausea without vomiting over the past several days.  She has a history of Crohn's disease.  She also reports that 2-3 days ago she didn't have a bowel movement and a large mass came out of her rectum and has been present since then.  She states it's bleeding and she is concerned it could be a hemorrhoid.  She's never had this happen before.  It appears to be a rectal prolapse the bedside.  No prior history of rectal prolapse.  She states her pain seems to be worsening at this time.   Past Medical History  Diagnosis Date  . VITAMIN B1 DEFICIENCY 09/21/2009  . B12 DEFICIENCY 04/28/2009  . ANXIETY 03/24/2009  . SMOKER 12/02/2009  . ADHD 12/02/2009  . COMMON MIGRAINE 02/05/2010  . HYPERTENSION 03/24/2009  . GERD 03/24/2009  . CROHN'S [facility] INTESTINE 05/19/2009  . Cellulitis and abscess of leg, except foot 02/05/2010  . ECZEMA 05/20/2010  . 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 Cervicalgia 12/02/2009  . Unspecified backache 12/02/2009  . BURSITIS, RIGHT KNEE 02/05/2010  . HEADACHE, CHRONIC 03/24/2009  . Abdominal pain, unspecified site 03/24/2009  . OTITIS MEDIA, LEFT 08/12/2010  . Wheezing 08/12/2010  . Allergic rhinitis, cause unspecified 01/21/2011  . Chronic back pain 01/21/2011  . Chronic neck pain 08/04/2011  . Endometriosis 08/04/2011   Past Surgical History  Procedure Laterality Date  . Endometrial ablation  01/2009  . Breast enhancement surgery  2004   Family History  Problem Relation Age of Onset  . Cancer Mother     breast  . Clotting disorder Mother   . Cancer Maternal Grandmother     Stomach Cancer  . Cancer Maternal Grandfather     Esophageal Cancer   History  Substance Use Topics  .  Smoking status: Current Every Day Smoker -- 0.50 packs/day    Types: Cigarettes  . Smokeless tobacco: Never Used     Comment: 5 daily  . Alcohol Use: Yes     Comment: rare   OB History   Grav Para Term Preterm Abortions TAB SAB Ect Mult Living                 Review of Systems  All other systems reviewed and are negative.      Allergies  Penicillins and Doxycycline  Home Medications   Current Outpatient Rx  Name  Route  Sig  Dispense  Refill  . acetaminophen (TYLENOL) 500 MG tablet   Oral   Take 1,000 mg by mouth every 6 (six) hours as needed for headache.         Marland Kitchen aspirin 325 MG tablet   Oral   Take 650 mg by mouth 2 (two) times daily as needed for headache.         . cyclobenzaprine (FLEXERIL) 10 MG tablet   Oral   Take 10 mg by mouth 3 (three) times daily.         . diphenhydrAMINE (SOMINEX) 25 MG tablet   Oral   Take 25 mg by mouth at bedtime as needed for allergies or sleep.         Marland Kitchen esomeprazole ([place])  40 MG capsule   Oral   Take 40 mg by mouth 2 (two) times daily before a meal.         . fexofenadine (ALLEGRA) 180 MG tablet   Oral   Take 1 tablet (180 mg total) by mouth daily.   30 tablet   11   . fexofenadine (ALLEGRA) 180 MG tablet   Oral   Take 180 mg by mouth daily as needed for allergies or rhinitis.         Marland Kitchen ibuprofen (ADVIL,MOTRIN) 200 MG tablet   Oral   Take 800 mg by mouth daily as needed for moderate pain.         . montelukast (SINGULAIR) 10 MG tablet   Oral   Take 10 mg by mouth daily as needed (for shortness of breath).         . pseudoephedrine (SUDAFED) 120 MG 12 hr tablet   Oral   Take 120 mg by mouth daily as needed for congestion.          BP 148/116  Pulse 96  Temp(Src) 98.3 F (36.8 C) (Oral)  Resp 16  Ht 5' 3"  (1.6 m)  Wt 117 lb (53.071 kg)  BMI 20.73 kg/m2  SpO2 100%  LMP 10/10/2011 Physical Exam  Nursing note and vitals reviewed. Constitutional: She is oriented to person, place, and  time. She appears well-developed and well-nourished. No distress.  Uncomfortable appearing  HENT:  Head: Normocephalic and atraumatic.  Eyes: EOM are normal.  Neck: Normal range of motion.  Cardiovascular: Normal rate, regular rhythm and normal heart sounds.   Pulmonary/Chest: Effort normal and breath sounds normal.  Abdominal: Soft. She exhibits no distension.  Lower generalized lower abdominal tenderness without guarding or rebound.  No peritonitis.  Genitourinary:  Large rectal mass consistent with a rectal prolapse.  No active bleeding at this time.  Musculoskeletal: Normal range of motion.  Neurological: She is alert and oriented to person, place, and time.  Skin: Skin is warm and dry.  Psychiatric: She has a normal mood and affect. Judgment normal.    ED Course  Procedures (including critical care time)  Procedural sedation Performed by: Hoy Morn Consent: Verbal consent obtained. Risks and benefits: risks, benefits and alternatives were discussed Required items: required blood products, implants, devices, and special equipment available Patient identity confirmed: arm band and provided demographic data Time out: Immediately prior to procedure a "time out" was called to verify the correct patient, procedure, equipment, support staff and site/side marked as required. Sedation type: moderate (conscious) sedation NPO time confirmed and considedered Sedatives: ETOMIDATE Physician Time at Bedside: 15 Vitals: Vital signs were monitored during sedation. Cardiac Monitor, pulse oximeter Patient tolerance: Patient tolerated the procedure well with no immediate complications. Comments: Pt with uneventful recovered. Returned to pre-procedural sedation baseline   Reduction of Rectal Prolapse Date/Time: 01/06/2013  Performed by: Hoy Morn Authorized by: Hoy Morn Consent: Verbal consent obtained. Risks and benefits: risks, benefits and alternatives were  discussed Consent given by: patient and mother Required items: required blood products, implants, devices, and special equipment available Time out: Immediately prior to procedure a "time out" was called to verify the correct patient, procedure, equipment, support staff and site/side marked as required. Patient sedated: etomidate (see note) Vitals: Vital signs were monitored during sedation. Patient tolerance: Patient tolerated the procedure well with no immediate complications.  rectal prolapse reduced with constant pressure without any significant difficulty.  Rectal exam after reduction demonstrates no significant abnormalities.  No gross blood.     Labs Review Labs Reviewed  CBC - Abnormal; Notable for the following:    WBC 12.4 (*)    Hemoglobin 15.2 (*)    Platelets 422 (*)    All other components within normal limits  COMPREHENSIVE METABOLIC PANEL - Abnormal; Notable for the following:    Potassium 2.8 (*)    Glucose, Bld 186 (*)    Calcium 8.2 (*)    Albumin 3.0 (*)    GFR calc non Af Amer 87 (*)    All other components within normal limits  POC URINE PREG, ED  TYPE AND SCREEN  ABO/RH   Imaging Review Ct Abdomen Pelvis W Contrast  10/09/2013   CLINICAL DATA:  The progressive rectal bleeding. History of Crohn's disease and ulcerative colitis.  EXAM: CT ABDOMEN AND PELVIS WITH CONTRAST  TECHNIQUE: Multidetector CT imaging of the abdomen and pelvis was performed using the standard protocol following bolus administration of intravenous contrast.  CONTRAST:  126m OMNIPAQUE IOHEXOL 300 MG/ML  SOLN  COMPARISON:  CT abdomen and pelvis 10/22/2010.  FINDINGS: Bilateral breast implants are present. The heart size is normal. The lungs are clear.  The liver and spleen are within normal limits. The stomach, duodenum, and pancreas are within normal limits. The common bile duct is dilated, measuring 8.5 mm at pancreatic head. No obstructing lesion is present. The gallbladder is within normal  limits. The adrenal glands and kidneys are normal bilaterally. The ureters are unremarkable.  Marked mucosal thickening and narrowing is present within the terminal ileum. The anastomosis is intact. There is no obstruction. Mild edematous changes are present at the rectum. The sigmoid colon is within normal limits. The descending, transverse, and distal ascending colon is normal. There is some inflammatory change about the cecum. The appendix is visualized and normal. The more proximal small bowel is unremarkable.  The bone windows are unremarkable.  IMPRESSION: 1. Extensive mucosal thickening within the terminal ileum without evidence for obstruction. This is compatible with terminal ileitis and consistent with the given diagnosis of Crohn's disease. 2. Mild inflammatory changes about the cecum. The remainder of the colon is unremarkable. 3. The inflammatory changes within the rectum. 4. Chronic dilation of the common bile duct without evidence for an obstructing lesion.   Electronically Signed   By: CLawrence SantiagoM.D.   On: 10/09/2013 21:21  I personally reviewed the imaging tests through PACS system I reviewed available ER/hospitalization records through the EMR    EKG Interpretation None      MDM   Final diagnoses:  Abdominal pain  Rectal prolapse  Hypokalemia  CROHN'S DISEASE-SMALL INTESTINE    Rectal prolapse reduced without a significant difficulty.  Sedation was required however.  Patient tolerated the procedure well.  Patient continued to have abdominal pain throughout her ER stay.  CT scan demonstrates what appears to be acute Crohn's flare.  Patient will be started on steroids and admitted to the hospital.    KHoy Morn MD 10/09/13 2209

## 2013-10-09 NOTE — H&P (Signed)
[facility] History and Physical  [name] TKZ:[id number] DOB: 23-Jan-1981 DOA: 10/09/2013  Referring physician: ER physician. PCP: Odette Fraction, MD   Chief Complaint: Abdominal pain and rectal prolapse.  HPI: [name] is a 33 y.o. female with known history of Crohn's disease and chronic back pain presents to the ER because of abdominal pain and rectal prolapse. In the ER patient had CT abdomen and pelvis which shows features consistent with terminal ileitis and mild cecal inflammation and is also rectal inflammation. Patient's rectal prolapse was reduced by the ER physician. Patient states that since last 3 days patient has been having rectal bleeding and was in usual abdominal pain across the abdomen particularly in the lower quadrants. Patient also has been having some nausea. In the ER patient was found to be hypokalemic. Patient has been admitted for further management. Patient denies any chest pain or shortness of breath or any focal deficits. In the ER patient was given oral potassium and one dose of IV Solu-Medrol for Crohn's colitis.                Review of Systems: As presented in the history of presenting illness, rest negative.  Past Medical History  Diagnosis Date  . VITAMIN B1 DEFICIENCY 09/21/2009  . B12 DEFICIENCY 04/28/2009  . ANXIETY 03/24/2009  . SMOKER 12/02/2009  . ADHD 12/02/2009  . COMMON MIGRAINE 02/05/2010  . HYPERTENSION 03/24/2009  . GERD 03/24/2009  . [facility] INTESTINE 05/19/2009  . Cellulitis and abscess of leg, except foot 02/05/2010  . ECZEMA 05/20/2010  . 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 Cervicalgia 12/02/2009  . Unspecified backache 12/02/2009  . BURSITIS, RIGHT KNEE 02/05/2010  . HEADACHE, CHRONIC 03/24/2009  . Abdominal pain, unspecified site 03/24/2009  . OTITIS MEDIA, LEFT 08/12/2010  . Wheezing 08/12/2010  . Allergic rhinitis, cause unspecified 01/21/2011  . Chronic back pain 01/21/2011  . Chronic neck pain 08/04/2011  .  Endometriosis 08/04/2011  . Osteoarthritis   . Fibromyalgia    Past Surgical History  Procedure Laterality Date  . Endometrial ablation  01/2009  . Breast enhancement surgery  2004   Social History:  reports that she has been smoking Cigarettes.  She has been smoking about 0.50 packs per day. She has never used smokeless tobacco. She reports that she drinks alcohol. She reports that she does not use illicit drugs. Where does patient live home.  Can patient participate in ADLs? Yes.   Allergies  Allergen Reactions  . Penicillins Hives  . Doxycycline Other (See Comments)    unknown    Family History:  Family History  Problem Relation Age of Onset  . Cancer Mother     breast  . Clotting disorder Mother   . Cancer Maternal Grandmother     Stomach Cancer  . Cancer Maternal Grandfather     Esophageal Cancer      Prior to Admission medications   Medication Sig Start Date End Date Taking? Authorizing Provider  acetaminophen (TYLENOL) 500 MG tablet Take 1,000 mg by mouth every 6 (six) hours as needed for headache.   Yes Historical Provider, MD  aspirin 325 MG tablet Take 650 mg by mouth 2 (two) times daily as needed for headache.   Yes Historical Provider, MD  cyclobenzaprine (FLEXERIL) 10 MG tablet Take 10 mg by mouth 3 (three) times daily.   Yes Historical Provider, MD  diphenhydrAMINE (SOMINEX) 25 MG tablet Take 25 mg by mouth at bedtime as needed for allergies or sleep.  Yes Historical Provider, MD  esomeprazole (NEXIUM) 40 MG capsule Take 40 mg by mouth 2 (two) times daily before a meal.   Yes Historical Provider, MD  fexofenadine (ALLEGRA) 180 MG tablet Take 1 tablet (180 mg total) by mouth daily. 03/28/12 10/09/13 Yes Biagio Borg, MD  fexofenadine (ALLEGRA) 180 MG tablet Take 180 mg by mouth daily as needed for allergies or rhinitis.   Yes Historical Provider, MD  ibuprofen (ADVIL,MOTRIN) 200 MG tablet Take 800 mg by mouth daily as needed for moderate pain.   Yes Historical  Provider, MD  montelukast (SINGULAIR) 10 MG tablet Take 10 mg by mouth daily as needed (for shortness of breath).   Yes Historical Provider, MD  pseudoephedrine (SUDAFED) 120 MG 12 hr tablet Take 120 mg by mouth daily as needed for congestion.   Yes Historical Provider, MD    Physical Exam: Filed Vitals:   10/09/13 2130 10/09/13 2140 10/09/13 2313 10/09/13 2318  BP: 148/116 148/116  169/112  Pulse: 88 96  99  Temp:    97.2 F (36.2 C)  TempSrc:   Oral Oral  Resp: 14 16  19   Height:   5' 3"  (1.6 m)   Weight:   54.25 kg (119 lb 9.6 oz)   SpO2: 98% 100%  97%     General:   well-developed well-nourished.  Eyes:  anicteric no pallor.  ENT:  no discharge from the ears eyes nose mouth.   Neck:  no mass felt.  Cardiovascular:  S1-S2 heard.  Respiratory: No rhonchi or crepitations.   Abdomen:  soft mild tenderness in the lower quadrants. No guarding or rigidity.   Skin:  no rash.  Musculoskeletal:  No edema.  Psychiatric: Appears normal.  Neurologic:  alert awake oriented to time place and person. Moves all extremities.   Labs on Admission:  Basic Metabolic Panel:  Recent Labs Lab 10/09/13 1648  NA 139  K 2.8*  CL 98  CO2 22  GLUCOSE 186*  BUN 9  CREATININE 0.87  CALCIUM 8.2*   Liver Function Tests:  Recent Labs Lab 10/09/13 1648  AST 13  ALT 6  ALKPHOS 83  BILITOT 0.4  PROT 6.3  ALBUMIN 3.0*   No results found for this basename: LIPASE, AMYLASE,  in the last 168 hours No results found for this basename: AMMONIA,  in the last 168 hours CBC:  Recent Labs Lab 10/09/13 1648  WBC 12.4*  HGB 15.2*  HCT 42.7  MCV 89.7  PLT 422*   Cardiac Enzymes: No results found for this basename: CKTOTAL, CKMB, CKMBINDEX, TROPONINI,  in the last 168 hours  BNP (last 3 results) No results found for this basename: PROBNP,  in the last 8760 hours CBG: No results found for this basename: GLUCAP,  in the last 168 hours  Radiological Exams on Admission: Ct  Abdomen Pelvis W Contrast  10/09/2013   CLINICAL DATA:  The progressive rectal bleeding. History of Crohn's disease and ulcerative colitis.  EXAM: CT ABDOMEN AND PELVIS WITH CONTRAST  TECHNIQUE: Multidetector CT imaging of the abdomen and pelvis was performed using the standard protocol following bolus administration of intravenous contrast.  CONTRAST:  144m OMNIPAQUE IOHEXOL 300 MG/ML  SOLN  COMPARISON:  CT abdomen and pelvis 10/22/2010.  FINDINGS: Bilateral breast implants are present. The heart size is normal. The lungs are clear.  The liver and spleen are within normal limits. The stomach, duodenum, and pancreas are within normal limits. The common bile duct is dilated, measuring 8.5  mm at pancreatic head. No obstructing lesion is present. The gallbladder is within normal limits. The adrenal glands and kidneys are normal bilaterally. The ureters are unremarkable.  Marked mucosal thickening and narrowing is present within the terminal ileum. The anastomosis is intact. There is no obstruction. Mild edematous changes are present at the rectum. The sigmoid colon is within normal limits. The descending, transverse, and distal ascending colon is normal. There is some inflammatory change about the cecum. The appendix is visualized and normal. The more proximal small bowel is unremarkable.  The bone windows are unremarkable.  IMPRESSION: 1. Extensive mucosal thickening within the terminal ileum without evidence for obstruction. This is compatible with terminal ileitis and consistent with the given diagnosis of Crohn's disease. 2. Mild inflammatory changes about the cecum. The remainder of the colon is unremarkable. 3. The inflammatory changes within the rectum. 4. Chronic dilation of the common bile duct without evidence for an obstructing lesion.   Electronically Signed   By: Lawrence Santiago M.D.   On: 10/09/2013 21:21     Assessment/Plan Principal Problem:   Abdominal pain Active Problems:   CROHN'S  DISEASE-SMALL INTESTINE   Hypokalemia   Rectal prolapse   Crohn's colitis   1. Crohn's colitis - patient has received one dose of IV Solu-Medrol. May discuss with gastroenterologist in a.m. for further recommendations. Continue with gentle hydration and pain relief medications. Patient states that previously she used to be on Humira which had given her significant relief but was eventually discontinued due to infections. 2. Severe hypokalemia - replace and recheck. 3. Rectal prolapse - presently has been reduced. 4. Rectal bleeding - probably from Crohn's colitis. Closely follow CBC. 5. Tobacco abuse - tobacco cessation counseling requested. 6. Elevated blood pressure - patient states that her blood pressure only increases during pain. Closely observe. I have placed patient on when necessary IV hydralazine for systolic blood pressure more than 160. 7. Hyperglycemia - probably from steroids. Check hemoglobin A1c.    Code Status:  full code.   Family Communication:  none.   Disposition Plan:  admit to inpatient.     KAKRAKANDY,ARSHAD N. [facility] Pager [phone].  If 7PM-7AM, please contact night-coverage [email or web address] Password [email or web address] 10/10/2013, 3:50 AM

## 2013-10-09 NOTE — Sedation Documentation (Signed)
Medication dose calculated and verified for 

## 2013-10-09 NOTE — Sedation Documentation (Signed)
REctal prolapse reduced- procedure took 30 seconds. Dr. Venora Maples remains at bedside

## 2013-10-09 NOTE — ED Notes (Signed)
Pt reports rectal bleeding since Sunday that is progressively worsening. Pt reports using appx 6 pads/ hour. AO x4. Denies lightheadedness.  Hx: Crohn's and ulcerative colitis

## 2013-10-10 ENCOUNTER — Encounter (HOSPITAL_COMMUNITY): Payer: Self-pay | Admitting: *Deleted

## 2013-10-10 DIAGNOSIS — E538 Deficiency of other specified B group vitamins: Secondary | ICD-10-CM

## 2013-10-10 LAB — COMPREHENSIVE METABOLIC PANEL
ALT: 7 U/L (ref 0–35)
AST: 14 U/L (ref 0–37)
Albumin: 3 g/dL — ABNORMAL LOW (ref 3.5–5.2)
Alkaline Phosphatase: 103 U/L (ref 39–117)
BUN: 9 mg/dL (ref 6–23)
CO2: 19 mEq/L (ref 19–32)
Calcium: 7.8 mg/dL — ABNORMAL LOW (ref 8.4–10.5)
Chloride: 99 mEq/L (ref 96–112)
Creatinine, Ser: 0.83 mg/dL (ref 0.50–1.10)
GFR calc Af Amer: >90 mL/min (ref >90)
GFR calc non Af Amer: >90 mL/min (ref >90)
Glucose, Bld: 189 mg/dL — ABNORMAL HIGH (ref 70–99)
Potassium: 3 mEq/L — ABNORMAL LOW (ref 3.7–5.3)
Sodium: 138 mEq/L (ref 137–147)
Total Bilirubin: <0.2 mg/dL — ABNORMAL LOW (ref 0.3–1.2)
Total Protein: 6.4 g/dL (ref 6.0–8.3)

## 2013-10-10 LAB — RAPID URINE DRUG SCREEN, HOSP PERFORMED
Amphetamines: NOT DETECTED
Barbiturates: NOT DETECTED
Benzodiazepines: NOT DETECTED
Cocaine: NOT DETECTED
Opiates: POSITIVE — AB
Tetrahydrocannabinol: NOT DETECTED

## 2013-10-10 LAB — HEMOGLOBIN A1C
Hgb A1c MFr Bld: 5.4 % (ref <5.7)
Mean Plasma Glucose: 108 mg/dL (ref <117)

## 2013-10-10 LAB — CBC WITH DIFFERENTIAL/PLATELET
Basophils Absolute: 0 10*3/uL (ref 0.0–0.1)
Basophils Relative: 0 % (ref 0–1)
Eosinophils Absolute: 0 10*3/uL (ref 0.0–0.7)
Eosinophils Relative: 0 % (ref 0–5)
HCT: 40.2 % (ref 36.0–46.0)
Hemoglobin: 14.1 g/dL (ref 12.0–15.0)
Lymphocytes Relative: 1 % — ABNORMAL LOW (ref 12–46)
Lymphs Abs: 0.3 10*3/uL — ABNORMAL LOW (ref 0.7–4.0)
MCH: 32 pg (ref 26.0–34.0)
MCHC: 35.1 g/dL (ref 30.0–36.0)
MCV: 91.2 fL (ref 78.0–100.0)
Monocytes Absolute: 0 10*3/uL — ABNORMAL LOW (ref 0.1–1.0)
Monocytes Relative: 0 % — ABNORMAL LOW (ref 3–12)
Neutro Abs: 26.1 10*3/uL — ABNORMAL HIGH (ref 1.7–7.7)
Neutrophils Relative %: 99 % — ABNORMAL HIGH (ref 43–77)
Platelets: 393 10*3/uL (ref 150–400)
RBC: 4.41 MIL/uL (ref 3.87–5.11)
RDW: 15.8 % — ABNORMAL HIGH (ref 11.5–15.5)
WBC: 26.4 10*3/uL — ABNORMAL HIGH (ref 4.0–10.5)

## 2013-10-10 LAB — MRSA PCR SCREENING: MRSA by PCR: NEGATIVE

## 2013-10-10 MED ORDER — PANTOPRAZOLE SODIUM 40 MG PO TBEC
40.0000 mg | DELAYED_RELEASE_TABLET | Freq: Two times a day (BID) | ORAL | Status: DC
  Administered 2013-10-10: 40 mg via ORAL
  Filled 2013-10-10: qty 1

## 2013-10-10 MED ORDER — INFLUENZA VAC SPLIT QUAD 0.5 ML IM SUSP
0.5000 mL | INTRAMUSCULAR | Status: AC
  Administered 2013-10-10: 0.5 mL via INTRAMUSCULAR
  Filled 2013-10-10: qty 0.5

## 2013-10-10 MED ORDER — OXYCODONE HCL 5 MG PO TABS
5.0000 mg | ORAL_TABLET | ORAL | Status: DC | PRN
  Administered 2013-10-10 – 2013-10-11 (×8): 5 mg via ORAL
  Filled 2013-10-10 (×8): qty 1

## 2013-10-10 MED ORDER — SODIUM CHLORIDE 0.9 % IV SOLN
1000.0000 mg | Freq: Two times a day (BID) | INTRAVENOUS | Status: DC
  Administered 2013-10-10 (×2): 1000 mg via INTRAVENOUS
  Filled 2013-10-10 (×3): qty 8

## 2013-10-10 MED ORDER — CYANOCOBALAMIN 1000 MCG/ML IJ SOLN
1000.0000 ug | Freq: Every day | INTRAMUSCULAR | Status: DC
  Administered 2013-10-10 – 2013-10-11 (×2): 1000 ug via INTRAMUSCULAR
  Filled 2013-10-10 (×2): qty 1

## 2013-10-10 MED ORDER — MONTELUKAST SODIUM 10 MG PO TABS
10.0000 mg | ORAL_TABLET | Freq: Every evening | ORAL | Status: DC | PRN
  Administered 2013-10-11: 10 mg via ORAL
  Filled 2013-10-10: qty 1

## 2013-10-10 MED ORDER — ALPRAZOLAM 0.5 MG PO TABS
1.0000 mg | ORAL_TABLET | Freq: Three times a day (TID) | ORAL | Status: DC | PRN
  Administered 2013-10-10: 1 mg via ORAL
  Filled 2013-10-10: qty 2

## 2013-10-10 MED ORDER — PANTOPRAZOLE SODIUM 40 MG PO TBEC
40.0000 mg | DELAYED_RELEASE_TABLET | Freq: Every day | ORAL | Status: DC
  Administered 2013-10-10: 40 mg via ORAL
  Filled 2013-10-10: qty 1

## 2013-10-10 MED ORDER — OXYCODONE-ACETAMINOPHEN 5-325 MG PO TABS
1.0000 | ORAL_TABLET | ORAL | Status: DC | PRN
  Administered 2013-10-10 – 2013-10-11 (×5): 1 via ORAL
  Filled 2013-10-10 (×5): qty 1

## 2013-10-10 MED ORDER — ALPRAZOLAM 0.5 MG PO TABS
0.5000 mg | ORAL_TABLET | Freq: Three times a day (TID) | ORAL | Status: DC | PRN
  Administered 2013-10-10 – 2013-10-11 (×3): 0.5 mg via ORAL
  Filled 2013-10-10 (×3): qty 1

## 2013-10-10 MED ORDER — HYDROCORTISONE 2.5 % RE CREA
TOPICAL_CREAM | Freq: Two times a day (BID) | RECTAL | Status: DC
  Administered 2013-10-10: 1 via RECTAL
  Administered 2013-10-11: 10:00:00 via RECTAL
  Filled 2013-10-10: qty 28.35

## 2013-10-10 MED ORDER — MORPHINE SULFATE 2 MG/ML IJ SOLN
2.0000 mg | INTRAMUSCULAR | Status: DC | PRN
  Administered 2013-10-10 (×4): 2 mg via INTRAVENOUS
  Filled 2013-10-10 (×4): qty 1

## 2013-10-10 MED ORDER — POTASSIUM CHLORIDE CRYS ER 20 MEQ PO TBCR
40.0000 meq | EXTENDED_RELEASE_TABLET | Freq: Two times a day (BID) | ORAL | Status: DC
Start: 2013-10-10 — End: 2013-10-10
  Administered 2013-10-10: 40 meq via ORAL
  Filled 2013-10-10: qty 2

## 2013-10-10 MED ORDER — HYDRALAZINE HCL 20 MG/ML IJ SOLN
10.0000 mg | INTRAMUSCULAR | Status: DC | PRN
  Administered 2013-10-10: 10 mg via INTRAVENOUS
  Filled 2013-10-10: qty 1

## 2013-10-10 MED ORDER — POTASSIUM CHLORIDE 20 MEQ/15ML (10%) PO LIQD
40.0000 meq | Freq: Two times a day (BID) | ORAL | Status: DC
  Administered 2013-10-10 – 2013-10-11 (×3): 40 meq via ORAL
  Filled 2013-10-10 (×4): qty 30

## 2013-10-10 MED ORDER — ZOLPIDEM TARTRATE 5 MG PO TABS
5.0000 mg | ORAL_TABLET | Freq: Once | ORAL | Status: AC
  Administered 2013-10-11: 5 mg via ORAL
  Filled 2013-10-10: qty 1

## 2013-10-10 MED ORDER — HYDROCODONE-ACETAMINOPHEN 7.5-325 MG PO TABS
1.0000 | ORAL_TABLET | Freq: Four times a day (QID) | ORAL | Status: DC | PRN
  Administered 2013-10-10: 1 via ORAL
  Filled 2013-10-10: qty 1

## 2013-10-10 NOTE — Care Management Note (Addendum)
  Page 1 of 1   10/10/2013     1:06:13 PM   CARE MANAGEMENT NOTE 10/10/2013  Patient:  [name], [name]   Account Number:  [id number]  Date Initiated:  10/10/2013  Documentation initiated by:  Lihanna Biever  Subjective/Objective Assessment:   Adm with Abd pain, bloody stools, Crohns     Action/Plan:   CM to follow for disposition needs   Anticipated DC Date:  10/10/2013   Anticipated DC Plan:  HOME/SELF CARE         Choice offered to / List presented to:             Status of service:  In process, will continue to follow Medicare Important Message given?   (If response is "NO", the following Medicare IM given date fields will be blank) Date Medicare IM given:   Date Additional Medicare IM given:    Discharge Disposition:    Per UR Regulation:  Reviewed for med. necessity/level of care/duration of stay  If discussed at [place] of Stay Meetings, dates discussed:    Comments:  10/10/2013 Plan for the day:  Iv fluids 100/h, IV Morphine Disposition Plan:  pending [place], BSN, M[facility], CCM 10/10/2013

## 2013-10-10 NOTE — Progress Notes (Signed)
UR completed Yazmin Locher K. Evalyne Cortopassi, RN, BSN, [place], CCM  10/10/2013 1:06 PM

## 2013-10-10 NOTE — Progress Notes (Signed)
[facility] PROGRESS NOTE Interim History: 33 y.o. female with known history of Crohn's disease and chronic back pain presents to the ER because of abdominal pain and rectal prolapse. In the ER patient had CT abdomen and pelvis which shows features consistent with terminal ileitis and mild cecal inflammation and is also rectal inflammation. Patient's rectal prolapse was reduced by the ER physician. Patient states that since last 3 days patient has been having rectal bleeding and was in usual abdominal pain across the abdomen particularly in the lower quadrants. Patient also has been having some nausea. In the ER patient was found to be hypokalemic. Patient has been admitted for further management  Assessment/Plan: Crohn's colitis/*Abdominal pain: - IV SOLUMEDROL. Abdominal pain & diarrhea improves. - will need GI follow up. - Avoid IV narcotics.  Severe hypokalemia: - Replace and recheck. Rectal prolapse  - Presently has been reduced.   Rectal bleeding: - probably from Crohn's colitis. Closely follow CBC.   Tobacco abuse:  - Tobacco cessation counseling requested.   Elevated blood pressure: - Patient states that her blood pressure only increases during pain. Closely observe.    Code Status: full code.  Family Communication: none.  Disposition Plan: admit to inpatient.     Consultants:  none  Procedures:  none  Antibiotics:  none (indicate start date, and stop date if known)  HPI/Subjective: Abdominal pain improved. Cont to have BRBPR  Objective: Filed Vitals:   10/10/13 0441 10/10/13 0502 10/10/13 1058 10/10/13 1100  BP: 149/90  147/101 143/102  Pulse: 107  96 94  Temp: 97.7 F (36.5 C)  98 F (36.7 C)   TempSrc: Oral  Oral   Resp: 18  18   Height:      Weight:  55.929 kg (123 lb 4.8 oz)    SpO2: 98%  100%     Intake/Output Summary (Last 24 hours) at 10/10/13 1211 Last data filed at 10/10/13 1104  Gross per 24 hour  Intake      0 ml  Output     901 ml  Net   -901 ml   Filed Weights   10/09/13 1644 10/09/13 2313 10/10/13 0502  Weight: 53.071 kg (117 lb) 54.25 kg (119 lb 9.6 oz) 55.929 kg (123 lb 4.8 oz)    Exam:  General: Alert, awake, oriented x3, in no acute distress.  HEENT: No bruits, no goiter.  Heart: Regular rate and rhythm, without murmurs, rubs, gallops.  Lungs: Good air movement,clear to auscultation. Abdomen: Soft, nontender, nondistended, positive bowel sounds.     Data Reviewed: Basic Metabolic Panel:  Recent Labs Lab 10/09/13 1648 10/10/13 0500  NA 139 138  K 2.8* 3.0*  CL 98 99  CO2 22 19  GLUCOSE 186* 189*  BUN 9 9  CREATININE 0.87 0.83  CALCIUM 8.2* 7.8*   Liver Function Tests:  Recent Labs Lab 10/09/13 1648 10/10/13 0500  AST 13 14  ALT 6 7  ALKPHOS 83 103  BILITOT 0.4 <0.2*  PROT 6.3 6.4  ALBUMIN 3.0* 3.0*   No results found for this basename: LIPASE, AMYLASE,  in the last 168 hours No results found for this basename: AMMONIA,  in the last 168 hours CBC:  Recent Labs Lab 10/09/13 1648 10/10/13 0500  WBC 12.4* 26.4*  NEUTROABS  --  26.1*  HGB 15.2* 14.1  HCT 42.7 40.2  MCV 89.7 91.2  PLT 422* 393   Cardiac Enzymes: No results found for this basename: CKTOTAL, CKMB, CKMBINDEX, TROPONINI,  in  the last 168 hours BNP (last 3 results) No results found for this basename: PROBNP,  in the last 8760 hours CBG: No results found for this basename: GLUCAP,  in the last 168 hours  Recent Results (from the past 240 hour(s))  MRSA PCR SCREENING     Status: None   Collection Time    10/10/13  2:26 AM      Result Value Ref Range Status   MRSA by PCR NEGATIVE  NEGATIVE Final   Comment:            The GeneXpert MRSA Assay (FDA     approved for NASAL specimens     only), is one component of a     comprehensive MRSA colonization     surveillance program. It is not     intended to diagnose MRSA     infection nor to guide or     monitor treatment for     MRSA infections.      Studies: Ct Abdomen Pelvis W Contrast  10/09/2013   CLINICAL DATA:  The progressive rectal bleeding. History of Crohn's disease and ulcerative colitis.  EXAM: CT ABDOMEN AND PELVIS WITH CONTRAST  TECHNIQUE: Multidetector CT imaging of the abdomen and pelvis was performed using the standard protocol following bolus administration of intravenous contrast.  CONTRAST:  113m OMNIPAQUE IOHEXOL 300 MG/ML  SOLN  COMPARISON:  CT abdomen and pelvis 10/22/2010.  FINDINGS: Bilateral breast implants are present. The heart size is normal. The lungs are clear.  The liver and spleen are within normal limits. The stomach, duodenum, and pancreas are within normal limits. The common bile duct is dilated, measuring 8.5 mm at pancreatic head. No obstructing lesion is present. The gallbladder is within normal limits. The adrenal glands and kidneys are normal bilaterally. The ureters are unremarkable.  Marked mucosal thickening and narrowing is present within the terminal ileum. The anastomosis is intact. There is no obstruction. Mild edematous changes are present at the rectum. The sigmoid colon is within normal limits. The descending, transverse, and distal ascending colon is normal. There is some inflammatory change about the cecum. The appendix is visualized and normal. The more proximal small bowel is unremarkable.  The bone windows are unremarkable.  IMPRESSION: 1. Extensive mucosal thickening within the terminal ileum without evidence for obstruction. This is compatible with terminal ileitis and consistent with the given diagnosis of Crohn's disease. 2. Mild inflammatory changes about the cecum. The remainder of the colon is unremarkable. 3. The inflammatory changes within the rectum. 4. Chronic dilation of the common bile duct without evidence for an obstructing lesion.   Electronically Signed   By: CLawrence SantiagoM.D.   On: 10/09/2013 21:21    Scheduled Meds: . pantoprazole  40 mg Oral Daily  . potassium chloride  40  mEq Oral BID  . sodium chloride  3 mL Intravenous Q12H   Continuous Infusions: . 0.9 % NaCl with KCl 20 mEq / L 100 mL/hr at 10/10/13 1[place]antine Markeith Jue  [facility] Pager [phone]  If 8PM-8AM, please contact night-coverage at [email or web address], password [email or web address]3/12/2013, 12:11 PM  LOS: 1 day

## 2013-10-10 NOTE — Progress Notes (Signed)
Pt. Was upset b/c she wanted to walk down to the Caf[facility]ia with her Mom and son but was advised against it by myself and charge nurse b/c we wouldn't be able to monitor her on the Telemetry from there. Pt. Became tearful and also refused her shower at that time that she had me get an order from the doctor for. Pt. Also called to [employer] to order at least 3 trays for dinner and kept getting replacements stating things were left off of her tray. Pt. Also complained that the Hydrocodone MD ordered for pain made her face turn red so MD changed medication to Percocet which pt. Takes at home. I checked pt.'s vitals and pt.'s vitals were stable except her B/P. Since it was so late in my shift oncoming nurse stated he'd notify the MD on call b/c he had to contact him about something else as well.I made oncoming nurse aware of all of the above information.

## 2013-10-10 NOTE — Progress Notes (Signed)
Pt reports bloody stool x1. Pt flushed the toilet before contacting RN. RN educated patient to save next BM before flushing. Will continue to monitor.

## 2013-10-11 DIAGNOSIS — R1084 Generalized abdominal pain: Secondary | ICD-10-CM

## 2013-10-11 LAB — GLUCOSE, CAPILLARY
Glucose-Capillary: 197 mg/dL — ABNORMAL HIGH (ref 70–99)
Glucose-Capillary: 141 mg/dL — ABNORMAL HIGH (ref 70–99)

## 2013-10-11 MED ORDER — ALPRAZOLAM 0.5 MG PO TABS: 0.5000 mg | ORAL_TABLET | Freq: Two times a day (BID) | ORAL | Status: DC | PRN

## 2013-10-11 MED ORDER — PREDNISOLONE 5 MG PO TABS: 40.0000 mg | ORAL_TABLET | Freq: Every day | ORAL | Status: DC

## 2013-10-11 MED ORDER — PANTOPRAZOLE SODIUM 40 MG PO TBEC
40.0000 mg | DELAYED_RELEASE_TABLET | Freq: Two times a day (BID) | ORAL | Status: DC
  Administered 2013-10-11: 40 mg via ORAL
  Filled 2013-10-11 (×2): qty 1

## 2013-10-11 MED ORDER — PREDNISONE 20 MG PO TABS: 40.0000 mg | ORAL_TABLET | Freq: Every day | ORAL | Status: DC

## 2013-10-11 MED ORDER — OXYCODONE-ACETAMINOPHEN 5-325 MG PO TABS: 1.0000 | ORAL_TABLET | ORAL | Status: DC | PRN

## 2013-10-11 NOTE — Progress Notes (Addendum)
Monitor discontinued, cleaned and placed in appropriate cubby for patient being discharged. CCMD notified.

## 2013-10-11 NOTE — Progress Notes (Signed)
Discharge instructions discussed with patient. Prescriptions for Percocet and Prednisolone given to patient. Patient instructed to f/u with Dr. Dennard Schaumann on 3/. Patient states she refuses to see this certain MD. Nicholas Lose, [place] tried scheduling appts with other doctor's office however due to patient history with doctors office, would not accept. Patient states she will not go due to copay of $100 and outstanding bills from Dr. Samella Parr office. Highly recommended patient to see MD so referral for GI can be made since [facility] will not accept patient at this time. Dr. Venetia Constable made aware.

## 2013-10-11 NOTE — Progress Notes (Signed)
Pt appeared very upset because she was not able to get pain medication after being discharged. Pt refused to be wheeled out by wheelchair, she ambulated off the unit pushing utility cart and she also stated she was currently bleeding and in pain. She stated she promised pain medicine prior to discharge and someone will hear about this. Patient did not inform anyone of any bleeding prior to being discharge.

## 2013-10-11 NOTE — Discharge Summary (Signed)
Physician Discharge Summary  [name] MHD:[id number] DOB: Dec 27, 1980 DOA: 10/09/2013  PCP: Odette Fraction, MD  Admit date: 10/09/2013 Discharge date: 10/11/2013  Time spent: 35 minutes  Recommendations for Outpatient Follow-up:  1. Follow up with GI in 4 week for Chron's.  Discharge Diagnoses:  Principal Problem:   Abdominal pain Active Problems:   Crohn's colitis   CROHN'S DISEASE-SMALL INTESTINE   Hypokalemia   Rectal prolapse   Discharge Condition: stable  Diet recommendation: regular  Filed Weights   10/09/13 2313 10/10/13 0502 10/11/13 0707  Weight: 54.25 kg (119 lb 9.6 oz) 55.929 kg (123 lb 4.8 oz) 58.695 kg (129 lb 6.4 oz)    History of present illness:  33 y.o. female with known history of Crohn's disease and chronic back pain presents to the ER because of abdominal pain and rectal prolapse. In the ER patient had CT abdomen and pelvis which shows features consistent with terminal ileitis and mild cecal inflammation and is also rectal inflammation. Patient's rectal prolapse was reduced by the ER physician. Patient states that since last 3 days patient has been having rectal bleeding and was in usual abdominal pain across the abdomen particularly in the lower quadrants. Patient also has been having some nausea. In the ER patient was found to be hypokalemic. Patient has been admitted for further management. Patient denies any chest pain or shortness of breath or any focal deficits.   Hospital Course:  Crohn's colitis/Abdominal pain:  - IV SOLUMEDROL. Abdominal pain & diarrhea improves.  - Once tolerating diet diarrhea and abdominal pain improved change to orals. - Will need GI follow up.   Severe hypokalemia:  - Replace and recheck.  Rectal bleeding:  - probably from Crohn's colitis. Closely follow CBC.  - Hbg stable.  Elevated blood pressure:  - Patient states that her blood pressure only increases during pain. Closely observe -  resolved.  Procedures:  CT abd and pelvis  Consultations:  none  Discharge Exam: Filed Vitals:   10/11/13 0901  BP: 165/106  Pulse: 89  Temp:   Resp:     General: A&O x Cardiovascular: RRR Respiratory: good air movement CTA B/L  Discharge Instructions      Discharge Orders   Future Orders Complete By Expires   Diet - low sodium heart healthy  As directed    Increase activity slowly  As directed        Medication List    STOP taking these medications       acetaminophen 500 MG tablet  Commonly known as:  TYLENOL     aspirin 325 MG tablet      TAKE these medications       cyclobenzaprine 10 MG tablet  Commonly known as:  FLEXERIL  Take 10 mg by mouth 3 (three) times daily.     diphenhydrAMINE 25 MG tablet  Commonly known as:  SOMINEX  Take 25 mg by mouth at bedtime as needed for allergies or sleep.     esomeprazole 40 MG capsule  Commonly known as:  NEXIUM  Take 40 mg by mouth 2 (two) times daily before a meal.     fexofenadine 180 MG tablet  Commonly known as:  ALLEGRA  Take 180 mg by mouth daily as needed for allergies or rhinitis.     fexofenadine 180 MG tablet  Commonly known as:  ALLEGRA  Take 1 tablet (180 mg total) by mouth daily.     ibuprofen 200 MG tablet  Commonly known as:  ADVIL,MOTRIN  Take 800 mg by mouth daily as needed for moderate pain.     montelukast 10 MG tablet  Commonly known as:  SINGULAIR  Take 10 mg by mouth daily as needed (for shortness of breath).     oxyCODONE-acetaminophen 5-325 MG per tablet  Commonly known as:  PERCOCET/ROXICET  Take 1 tablet by mouth every 4 (four) hours as needed for severe pain.     prednisoLONE 5 MG Tabs tablet  Take 8 tablets (40 mg total) by mouth daily.     pseudoephedrine 120 MG 12 hr tablet  Commonly known as:  SUDAFED  Take 120 mg by mouth daily as needed for congestion.       Allergies  Allergen Reactions  . Penicillins Hives  . Doxycycline Other (See Comments)     unknown   Follow-up Information   Follow up with [facility].   Contact information:   [address] [place] [address] [phone]       The results of significant diagnostics from this hospitalization (including imaging, microbiology, ancillary and laboratory) are listed below for reference.    Significant Diagnostic Studies: Ct Abdomen Pelvis W Contrast  10/09/2013   CLINICAL DATA:  The progressive rectal bleeding. History of Crohn's disease and ulcerative colitis.  EXAM: CT ABDOMEN AND PELVIS WITH CONTRAST  TECHNIQUE: Multidetector CT imaging of the abdomen and pelvis was performed using the standard protocol following bolus administration of intravenous contrast.  CONTRAST:  124m OMNIPAQUE IOHEXOL 300 MG/ML  SOLN  COMPARISON:  CT abdomen and pelvis 10/22/2010.  FINDINGS: Bilateral breast implants are present. The heart size is normal. The lungs are clear.  The liver and spleen are within normal limits. The stomach, duodenum, and pancreas are within normal limits. The common bile duct is dilated, measuring 8.5 mm at pancreatic head. No obstructing lesion is present. The gallbladder is within normal limits. The adrenal glands and kidneys are normal bilaterally. The ureters are unremarkable.  Marked mucosal thickening and narrowing is present within the terminal ileum. The anastomosis is intact. There is no obstruction. Mild edematous changes are present at the rectum. The sigmoid colon is within normal limits. The descending, transverse, and distal ascending colon is normal. There is some inflammatory change about the cecum. The appendix is visualized and normal. The more proximal small bowel is unremarkable.  The bone windows are unremarkable.  IMPRESSION: 1. Extensive mucosal thickening within the terminal ileum without evidence for obstruction. This is compatible with terminal ileitis and consistent with the given diagnosis of Crohn's disease. 2. Mild inflammatory  changes about the cecum. The remainder of the colon is unremarkable. 3. The inflammatory changes within the rectum. 4. Chronic dilation of the common bile duct without evidence for an obstructing lesion.   Electronically Signed   By: CLawrence SantiagoM.D.   On: 10/09/2013 21:21    Microbiology: Recent Results (from the past 240 hour(s))  MRSA PCR SCREENING     Status: None   Collection Time    10/10/13  2:26 AM      Result Value Ref Range Status   MRSA by PCR NEGATIVE  NEGATIVE Final   Comment:            The GeneXpert MRSA Assay (FDA     approved for NASAL specimens     only), is one component of a     comprehensive MRSA colonization     surveillance program. It is not     intended  to diagnose MRSA     infection nor to guide or     monitor treatment for     MRSA infections.     Labs: Basic Metabolic Panel:  Recent Labs Lab 10/09/13 1648 10/10/13 0500  NA 139 138  K 2.8* 3.0*  CL 98 99  CO2 22 19  GLUCOSE 186* 189*  BUN 9 9  CREATININE 0.87 0.83  CALCIUM 8.2* 7.8*   Liver Function Tests:  Recent Labs Lab 10/09/13 1648 10/10/13 0500  AST 13 14  ALT 6 7  ALKPHOS 83 103  BILITOT 0.4 <0.2*  PROT 6.3 6.4  ALBUMIN 3.0* 3.0*   No results found for this basename: LIPASE, AMYLASE,  in the last 168 hours No results found for this basename: AMMONIA,  in the last 168 hours CBC:  Recent Labs Lab 10/09/13 1648 10/10/13 0500  WBC 12.4* 26.4*  NEUTROABS  --  26.1*  HGB 15.2* 14.1  HCT 42.7 40.2  MCV 89.7 91.2  PLT 422* 393   Cardiac Enzymes: No results found for this basename: CKTOTAL, CKMB, CKMBINDEX, TROPONINI,  in the last 168 hours BNP: BNP (last 3 results) No results found for this basename: PROBNP,  in the last 8760 hours CBG:  Recent Labs Lab 10/11/13 0737 10/11/13 1121  GLUCAP 197* 141*       Signed:  Charlynne Cousins  [facility] 10/11/2013, 11:37 AM

## 2013-10-25 ENCOUNTER — Inpatient Hospital Stay (HOSPITAL_COMMUNITY)
Admission: EM | Admit: 2013-10-25 | Discharge: 2013-10-30 | DRG: 394 | Disposition: A | Discharge status: Home / Self Care | Payer: Medicaid Other | Attending: Internal Medicine | Admitting: Internal Medicine

## 2013-10-25 ENCOUNTER — Inpatient Hospital Stay: Payer: Self-pay | Admitting: Family Medicine

## 2013-10-25 ENCOUNTER — Encounter (HOSPITAL_COMMUNITY): Payer: Self-pay | Admitting: Emergency Medicine

## 2013-10-25 DIAGNOSIS — K5010 Crohn's disease of large intestine without complications: Secondary | ICD-10-CM

## 2013-10-25 DIAGNOSIS — M542 Cervicalgia: Secondary | ICD-10-CM | POA: Diagnosis present

## 2013-10-25 DIAGNOSIS — E876 Hypokalemia: Secondary | ICD-10-CM

## 2013-10-25 DIAGNOSIS — Z803 Family history of malignant neoplasm of breast: Secondary | ICD-10-CM

## 2013-10-25 DIAGNOSIS — Z23 Encounter for immunization: Secondary | ICD-10-CM

## 2013-10-25 DIAGNOSIS — Z91199 Patient's noncompliance with other medical treatment and regimen due to unspecified reason: Secondary | ICD-10-CM

## 2013-10-25 DIAGNOSIS — K5000 Crohn's disease of small intestine without complications: Secondary | ICD-10-CM | POA: Diagnosis present

## 2013-10-25 DIAGNOSIS — E538 Deficiency of other specified B group vitamins: Secondary | ICD-10-CM | POA: Diagnosis present

## 2013-10-25 DIAGNOSIS — F17200 Nicotine dependence, unspecified, uncomplicated: Secondary | ICD-10-CM | POA: Diagnosis present

## 2013-10-25 DIAGNOSIS — K623 Rectal prolapse: Principal | ICD-10-CM

## 2013-10-25 DIAGNOSIS — Z832 Family history of diseases of the blood and blood-forming organs and certain disorders involving the immune mechanism: Secondary | ICD-10-CM

## 2013-10-25 DIAGNOSIS — E875 Hyperkalemia: Secondary | ICD-10-CM | POA: Diagnosis not present

## 2013-10-25 DIAGNOSIS — Z800 Family history of malignant neoplasm of digestive organs: Secondary | ICD-10-CM

## 2013-10-25 DIAGNOSIS — F411 Generalized anxiety disorder: Secondary | ICD-10-CM

## 2013-10-25 DIAGNOSIS — R1084 Generalized abdominal pain: Secondary | ICD-10-CM

## 2013-10-25 DIAGNOSIS — M1990 Unspecified osteoarthritis, unspecified site: Secondary | ICD-10-CM | POA: Diagnosis present

## 2013-10-25 DIAGNOSIS — M549 Dorsalgia, unspecified: Secondary | ICD-10-CM | POA: Diagnosis present

## 2013-10-25 DIAGNOSIS — K219 Gastro-esophageal reflux disease without esophagitis: Secondary | ICD-10-CM

## 2013-10-25 DIAGNOSIS — Z881 Allergy status to other antibiotic agents status: Secondary | ICD-10-CM

## 2013-10-25 DIAGNOSIS — F909 Attention-deficit hyperactivity disorder, unspecified type: Secondary | ICD-10-CM | POA: Diagnosis present

## 2013-10-25 DIAGNOSIS — I10 Essential (primary) hypertension: Secondary | ICD-10-CM | POA: Diagnosis present

## 2013-10-25 DIAGNOSIS — L259 Unspecified contact dermatitis, unspecified cause: Secondary | ICD-10-CM | POA: Diagnosis present

## 2013-10-25 DIAGNOSIS — G8929 Other chronic pain: Secondary | ICD-10-CM | POA: Diagnosis present

## 2013-10-25 DIAGNOSIS — Z9119 Patient's noncompliance with other medical treatment and regimen: Secondary | ICD-10-CM

## 2013-10-25 DIAGNOSIS — K5080 Crohn's disease of both small and large intestine without complications: Secondary | ICD-10-CM

## 2013-10-25 DIAGNOSIS — R109 Unspecified abdominal pain: Secondary | ICD-10-CM

## 2013-10-25 DIAGNOSIS — Z79899 Other long term (current) drug therapy: Secondary | ICD-10-CM

## 2013-10-25 DIAGNOSIS — Z880 Allergy status to penicillin: Secondary | ICD-10-CM

## 2013-10-25 HISTORY — DX: Dorsalgia, unspecified: M54.9

## 2013-10-25 LAB — COMPREHENSIVE METABOLIC PANEL
ALT: 17 U/L (ref 0–35)
AST: 22 U/L (ref 0–37)
Albumin: 2.7 g/dL — ABNORMAL LOW (ref 3.5–5.2)
Alkaline Phosphatase: 102 U/L (ref 39–117)
BUN: 5 mg/dL — ABNORMAL LOW (ref 6–23)
CO2: 23 mEq/L (ref 19–32)
Calcium: 7.7 mg/dL — ABNORMAL LOW (ref 8.4–10.5)
Chloride: 104 mEq/L (ref 96–112)
Creatinine, Ser: 0.63 mg/dL (ref 0.50–1.10)
GFR calc Af Amer: >90 mL/min (ref >90)
GFR calc non Af Amer: >90 mL/min (ref >90)
Glucose, Bld: 75 mg/dL (ref 70–99)
Potassium: 2.6 mEq/L — CL (ref 3.7–5.3)
Sodium: 144 mEq/L (ref 137–147)
Total Bilirubin: 0.2 mg/dL — ABNORMAL LOW (ref 0.3–1.2)
Total Protein: 6 g/dL (ref 6.0–8.3)

## 2013-10-25 LAB — CBC WITH DIFFERENTIAL/PLATELET
Basophils Absolute: 0.1 10*3/uL (ref 0.0–0.1)
Basophils Relative: 1 % (ref 0–1)
Eosinophils Absolute: 0 10*3/uL (ref 0.0–0.7)
Eosinophils Relative: 0 % (ref 0–5)
HCT: 39.1 % (ref 36.0–46.0)
Hemoglobin: 14 g/dL (ref 12.0–15.0)
Lymphocytes Relative: 18 % (ref 12–46)
Lymphs Abs: 2 10*3/uL (ref 0.7–4.0)
MCH: 32.3 pg (ref 26.0–34.0)
MCHC: 35.8 g/dL (ref 30.0–36.0)
MCV: 90.1 fL (ref 78.0–100.0)
Monocytes Absolute: 1 10*3/uL (ref 0.1–1.0)
Monocytes Relative: 9 % (ref 3–12)
Neutro Abs: 8 10*3/uL — ABNORMAL HIGH (ref 1.7–7.7)
Neutrophils Relative %: 72 % (ref 43–77)
Platelets: 586 10*3/uL — ABNORMAL HIGH (ref 150–400)
RBC: 4.34 MIL/uL (ref 3.87–5.11)
RDW: 15.9 % — ABNORMAL HIGH (ref 11.5–15.5)
WBC: 11.1 10*3/uL — ABNORMAL HIGH (ref 4.0–10.5)

## 2013-10-25 LAB — LIPASE, BLOOD: Lipase: 13 U/L (ref 11–59)

## 2013-10-25 MED ORDER — POTASSIUM CHLORIDE 10 MEQ/100ML IV SOLN
10.0000 meq | Freq: Once | INTRAVENOUS | Status: AC
  Administered 2013-10-25: 10 meq via INTRAVENOUS
  Filled 2013-10-25: qty 100

## 2013-10-25 MED ORDER — HYDROMORPHONE HCL PF 1 MG/ML IJ SOLN
1.0000 mg | Freq: Once | INTRAMUSCULAR | Status: AC
  Administered 2013-10-25: 1 mg via INTRAVENOUS
  Filled 2013-10-25: qty 1

## 2013-10-25 MED ORDER — POTASSIUM CHLORIDE CRYS ER 20 MEQ PO TBCR
40.0000 meq | EXTENDED_RELEASE_TABLET | Freq: Once | ORAL | Status: AC
  Administered 2013-10-25: 40 meq via ORAL
  Filled 2013-10-25: qty 2

## 2013-10-25 MED ORDER — ONDANSETRON HCL 4 MG/2ML IJ SOLN: 4.0000 mg | Freq: Once | INTRAMUSCULAR | Status: DC

## 2013-10-25 MED ORDER — ONDANSETRON HCL 4 MG/2ML IJ SOLN
4.0000 mg | Freq: Once | INTRAMUSCULAR | Status: AC
  Administered 2013-10-25: 4 mg via INTRAVENOUS
  Filled 2013-10-25: qty 2

## 2013-10-25 MED ORDER — SENNOSIDES-DOCUSATE SODIUM 8.6-50 MG PO TABS: 1.0000 | ORAL_TABLET | Freq: Two times a day (BID) | ORAL | Status: DC

## 2013-10-25 MED ORDER — MORPHINE SULFATE 4 MG/ML IJ SOLN
4.0000 mg | Freq: Once | INTRAMUSCULAR | Status: AC
  Administered 2013-10-26: 4 mg via INTRAVENOUS
  Filled 2013-10-25: qty 1

## 2013-10-25 MED ORDER — POLYETHYLENE GLYCOL 3350 17 G PO PACK: 34.0000 g | PACK | Freq: Every day | ORAL | Status: DC

## 2013-10-25 MED ORDER — LORAZEPAM 1 MG PO TABS
1.0000 mg | ORAL_TABLET | Freq: Once | ORAL | Status: AC
  Administered 2013-10-26: 1 mg via SUBLINGUAL
  Filled 2013-10-25: qty 1

## 2013-10-25 MED ORDER — ETOMIDATE 2 MG/ML IV SOLN
10.0000 mg | Freq: Once | INTRAVENOUS | Status: AC
  Administered 2013-10-25: 10 mg via INTRAVENOUS
  Filled 2013-10-25: qty 10

## 2013-10-25 MED ORDER — HYDROMORPHONE HCL PF 1 MG/ML IJ SOLN
0.5000 mg | Freq: Once | INTRAMUSCULAR | Status: AC
  Administered 2013-10-25: 0.5 mg via INTRAVENOUS
  Filled 2013-10-25: qty 1

## 2013-10-25 NOTE — ED Notes (Signed)
Granulated sugar applied to rectal prolapse.  Pt tolerated well.

## 2013-10-25 NOTE — ED Notes (Signed)
Pt incontinent of small stool.  Pt cleaned and repositioned in bed for comfort.

## 2013-10-25 NOTE — ED Notes (Signed)
The pt has crohns disease and has had a rectal prolapse 2 weeks ago.  She is here for pain today.  lmp  2 years ago

## 2013-10-25 NOTE — ED Notes (Signed)
CRITICAL VALUE ALERT  Critical value received:  Potassium 2.6  Date of notification:  10/25/2013  Time of notification:  6270  Critical value read back: Yes   Nurse who received alert:  Elyn Peers   MD notified (1st page):  [place]

## 2013-10-25 NOTE — ED Notes (Signed)
Incontinent of stool

## 2013-10-25 NOTE — ED Notes (Signed)
The pt has not seen a surgeon she was referred by an edp but never followed up.[name]

## 2013-10-25 NOTE — ED Notes (Addendum)
Pt presents with a possible rectal prolapse, pt seen here a week ago for the same, states the EDP pushed it back in and instructed her to follow up with a surgeon. Pt states she has not followed up at this time with a surgeon. Pt also states she has been feeling weak and falling several times in the last week. Pt also reports nausea, vomiting, diarrhea, abd pain, and rectal pain. Pt c/o posterior neck pain due to falling on her neck. Pt states she does not know what she landed on but also punctured her rectal prolapse.

## 2013-10-25 NOTE — ED Provider Notes (Signed)
CSN: [id number]     Arrival date & time 10/25/13  1650 History   First MD Initiated Contact with Patient 10/25/13 1750     Chief Complaint  Patient presents with  . rectal prolapse    HPI 33 year old female with a history of Crohn's disease presents with rectal prolapse.  Of not, she was seen here for the same about 2-3 weeks ago.    She feels like her rectum prolapsed yesterday. Since that time she's had severe pain in her anal region. Pain is sore and cramping in nature. It is worsening. It is aggravated by movement. Relieved by nothing.  She's not currently taking any medications for Crohn's disease.  There are last ED visit, reduction of the rectal prolapse was performed and she was ultimately admitted for pain control and for hypokalemia management. She's to be dehydrated and was felt to be having a Crohn's flare. She was given steroids. In the interim she has not yet followed up with surgery. She's not been on stool softeners.   Past Medical History  Diagnosis Date  . VITAMIN B1 DEFICIENCY 09/21/2009  . B12 DEFICIENCY 04/28/2009  . ANXIETY 03/24/2009  . SMOKER 12/02/2009  . ADHD 12/02/2009  . COMMON MIGRAINE 02/05/2010  . HYPERTENSION 03/24/2009  . GERD 03/24/2009  . CROHN'S [facility] INTESTINE 05/19/2009  . Cellulitis and abscess of leg, except foot 02/05/2010  . ECZEMA 05/20/2010  . 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 Cervicalgia 12/02/2009  . Unspecified backache 12/02/2009  . BURSITIS, RIGHT KNEE 02/05/2010  . HEADACHE, CHRONIC 03/24/2009  . Abdominal pain, unspecified site 03/24/2009  . OTITIS MEDIA, LEFT 08/12/2010  . Wheezing 08/12/2010  . Allergic rhinitis, cause unspecified 01/21/2011  . Chronic back pain 01/21/2011  . Chronic neck pain 08/04/2011  . Endometriosis 08/04/2011  . Osteoarthritis   . Fibromyalgia    Past Surgical History  Procedure Laterality Date  . Endometrial ablation  01/2009  . Breast enhancement surgery  2004   Family History  Problem Relation Age of Onset  .  Cancer Mother     breast  . Clotting disorder Mother   . Cancer Maternal Grandmother     Stomach Cancer  . Cancer Maternal Grandfather     Esophageal Cancer   History  Substance Use Topics  . Smoking status: Current Every Day Smoker -- 0.50 packs/day    Types: Cigarettes  . Smokeless tobacco: Never Used     Comment: 5 daily  . Alcohol Use: Yes     Comment: rare   OB History   Grav Para Term Preterm Abortions TAB SAB Ect Mult Living                 Review of Systems  Constitutional: Negative for fever, chills and diaphoresis.  HENT: Negative for congestion and rhinorrhea.   Respiratory: Negative for cough, shortness of breath and wheezing.   Cardiovascular: Negative for chest pain and leg swelling.  Gastrointestinal: Positive for abdominal pain. Negative for nausea, vomiting and diarrhea.  Genitourinary: Negative for dysuria, urgency, frequency, flank pain, vaginal bleeding, vaginal discharge and difficulty urinating.  Musculoskeletal: Negative for neck pain and neck stiffness.  Skin: Negative for rash.  Neurological: Negative for weakness, numbness and headaches.  All other systems reviewed and are negative.      Allergies  Penicillins and Doxycycline  Home Medications   Current Outpatient Rx  Name  Route  Sig  Dispense  Refill  . cyclobenzaprine (FLEXERIL) 10 MG tablet   Oral   Take  10 mg by mouth 3 (three) times daily as needed for muscle spasms.          . diphenhydrAMINE (SOMINEX) 25 MG tablet   Oral   Take 25 mg by mouth at bedtime as needed for allergies or sleep.         Marland Kitchen esomeprazole (NEXIUM) 40 MG capsule   Oral   Take 40 mg by mouth 2 (two) times daily before a meal.         . fexofenadine (ALLEGRA) 180 MG tablet   Oral   Take 180 mg by mouth daily as needed for allergies or rhinitis.         Marland Kitchen ibuprofen (ADVIL,MOTRIN) 200 MG tablet   Oral   Take 800 mg by mouth daily as needed for moderate pain.         . montelukast (SINGULAIR)  10 MG tablet   Oral   Take 10 mg by mouth daily as needed (for shortness of breath).         Marland Kitchen oxyCODONE-acetaminophen (PERCOCET/ROXICET) 5-325 MG per tablet   Oral   Take 1 tablet by mouth every 4 (four) hours as needed for severe pain.   15 tablet   0   . EXPIRED: fexofenadine (ALLEGRA) 180 MG tablet   Oral   Take 1 tablet (180 mg total) by mouth daily.   30 tablet   11   . polyethylene glycol (MIRALAX / GLYCOLAX) packet   Oral   Take 34 g by mouth daily.   14 each   0   . potassium chloride 20 MEQ/15ML (10%) SOLN   Oral   Take 15 mLs (20 mEq total) by mouth 2 (two) times daily.   900 mL   0   . senna-docusate (SENOKOT-S) 8.6-50 MG per tablet   Oral   Take 1 tablet by mouth 2 (two) times daily.   60 tablet   0    BP 185/121  Pulse 78  Temp(Src) 98.1 F (36.7 C) (Oral)  Resp 11  Wt 115 lb (52.164 kg)  SpO2 99%  LMP 10/10/2011 Physical Exam  Nursing note and vitals reviewed. Constitutional: She is oriented to person, place, and time. She appears well-developed and well-nourished. She appears distressed (appears in pain).  HENT:  Head: Normocephalic and atraumatic.  Mouth/Throat: Oropharynx is clear and moist.  Eyes: Conjunctivae and EOM are normal. Pupils are equal, round, and reactive to light. No scleral icterus.  Neck: Normal range of motion. Neck supple.  Cardiovascular: Normal rate, regular rhythm, normal heart sounds and intact distal pulses.  Exam reveals no gallop and no friction rub.   No murmur heard. Pulmonary/Chest: Effort normal and breath sounds normal. No respiratory distress. She has no wheezes. She has no rales.  Abdominal: Soft. Bowel sounds are normal. She exhibits no distension and no mass. There is no tenderness. There is no rebound and no guarding.  Genitourinary:  Rectal prolapse is noted, mucus is dark pink and moist with no crepitus. She has a large hemorrhoid as well. It is nonthrombosed.  Musculoskeletal: She exhibits no edema.   Neurological: She is alert and oriented to person, place, and time. No cranial nerve deficit. She exhibits normal muscle tone. Coordination normal.  Skin: Skin is dry. No rash noted. She is not diaphoretic.  Psychiatric: She has a normal mood and affect.    ED Course  Procedural sedation Date/Time: 10/26/2013 11:38 PM Performed by: Ernestina Patches, E Authorized by: Ernestina Patches, E Consent: Verbal  consent obtained. written consent obtained. Risks and benefits: risks, benefits and alternatives were discussed Consent given by: patient Patient understanding: patient states understanding of the procedure being performed Patient consent: the patient's understanding of the procedure matches consent given Procedure consent: procedure consent matches procedure scheduled Relevant documents: relevant documents present and verified Test results: test results available and properly labeled Site marked: the operative site was marked Imaging studies: imaging studies available Required items: required blood products, implants, devices, and special equipment available Patient identity confirmed: verbally with patient Time out: Immediately prior to procedure a "time out" was called to verify the correct patient, procedure, equipment, support staff and site/side marked as required. Patient sedated: yes Sedation type: moderate (conscious) sedation Sedatives: etomidate Sedation start date/time: 10/26/2013 11:28 PM Sedation end date/time: 10/26/2013 11:46 PM Vitals: Vital signs were monitored during sedation. Patient tolerance: Patient tolerated the procedure well with no immediate complications.   (including critical care time) Labs Review Labs Reviewed  CBC WITH DIFFERENTIAL - Abnormal; Notable for the following:    WBC 11.1 (*)    RDW 15.9 (*)    Platelets 586 (*)    Neutro Abs 8.0 (*)    All other components within normal limits  COMPREHENSIVE METABOLIC PANEL - Abnormal; Notable for the  following:    Potassium 2.6 (*)    BUN 5 (*)    Calcium 7.7 (*)    Albumin 2.7 (*)    Total Bilirubin 0.2 (*)    All other components within normal limits  LIPASE, BLOOD  URINALYSIS, ROUTINE W REFLEX MICROSCOPIC   Imaging Review No results found.   EKG Interpretation None      MDM   33 year old female with Crohn's disease presents with rectal prolapse. Procedural sedation was performed with etomidate and the prolapse was successfully reduced. However, after the procedure the patient continues to complain of severe pain and a sensation that her records about the prolapse again. She states that she can now walk.  Her labs are pertinent for white blood cell count 11.1, potassium 2.6, remainder of CBC, metabolic panel, lipase, urinalysis are normal.  Her pain was reamers apart with Dilaudid and morphine. She was given IV fluids. She's given by mouth and IV potassium chloride for placement.  Due to ongoing pain and symptom of impending prolapse, we'll plan to admit to internal medicine for symptom control, monitoring, possible surgical consult.  Final diagnoses:  Rectal prolapse  Crohn's colitis     Wendall Papa, MD 10/27/13 0140  Medical screening examination/treatment/procedure(s) were conducted as a shared visit with resident-physician practitioner(s) and myself.  I personally evaluated the patient during the encounter.  Pt is a 33 y.o. female with pmhx as above presenting with recurrent rectal prolapse.  Granulated sugar placed on prolapsed rectum prior to reduction under procedural sedation.  Pt tolerate procedure well, but continued to have significant pain/tenesmus and was unable to ambulate due to symptoms and was admitted to [facility].   Neta Ehlers, MD 10/27/13 1225

## 2013-10-26 LAB — CBC
HCT: 35.4 % — ABNORMAL LOW (ref 36.0–46.0)
Hemoglobin: 12.2 g/dL (ref 12.0–15.0)
MCH: 30.9 pg (ref 26.0–34.0)
MCHC: 34.5 g/dL (ref 30.0–36.0)
MCV: 89.6 fL (ref 78.0–100.0)
Platelets: 530 10*3/uL — ABNORMAL HIGH (ref 150–400)
RBC: 3.95 MIL/uL (ref 3.87–5.11)
RDW: 15.9 % — ABNORMAL HIGH (ref 11.5–15.5)
WBC: 13.6 10*3/uL — ABNORMAL HIGH (ref 4.0–10.5)

## 2013-10-26 LAB — BASIC METABOLIC PANEL
BUN: 5 mg/dL — ABNORMAL LOW (ref 6–23)
CO2: 20 mEq/L (ref 19–32)
Calcium: 6.3 mg/dL — CL (ref 8.4–10.5)
Chloride: 108 mEq/L (ref 96–112)
Creatinine, Ser: 0.6 mg/dL (ref 0.50–1.10)
GFR calc Af Amer: >90 mL/min (ref >90)
GFR calc non Af Amer: >90 mL/min (ref >90)
Glucose, Bld: 106 mg/dL — ABNORMAL HIGH (ref 70–99)
Potassium: 3 mEq/L — ABNORMAL LOW (ref 3.7–5.3)
Sodium: 143 mEq/L (ref 137–147)

## 2013-10-26 LAB — CREATININE, SERUM
Creatinine, Ser: 0.6 mg/dL (ref 0.50–1.10)
GFR calc Af Amer: >90 mL/min (ref >90)
GFR calc non Af Amer: >90 mL/min (ref >90)

## 2013-10-26 LAB — MAGNESIUM
Magnesium: 0.6 mg/dL — CL (ref 1.5–2.5)
Magnesium: 2.2 mg/dL (ref 1.5–2.5)

## 2013-10-26 LAB — POTASSIUM
Potassium: 2.8 mEq/L — CL (ref 3.7–5.3)
Potassium: 3.4 mEq/L — ABNORMAL LOW (ref 3.7–5.3)

## 2013-10-26 LAB — CLOSTRIDIUM DIFFICILE BY PCR: Toxigenic C. Difficile by PCR: NEGATIVE

## 2013-10-26 LAB — C-REACTIVE PROTEIN: CRP: 0.5 mg/dL — ABNORMAL LOW (ref <0.60)

## 2013-10-26 LAB — SEDIMENTATION RATE: Sed Rate: 9 mm/hr (ref 0–22)

## 2013-10-26 MED ORDER — ALBUTEROL SULFATE (2.5 MG/3ML) 0.083% IN NEBU
2.5000 mg | INHALATION_SOLUTION | RESPIRATORY_TRACT | Status: DC | PRN
Start: 2013-10-26 — End: 2013-10-30

## 2013-10-26 MED ORDER — CIPROFLOXACIN IN D5W 400 MG/200ML IV SOLN
400.0000 mg | Freq: Two times a day (BID) | INTRAVENOUS | Status: DC
  Filled 2013-10-26 (×2): qty 200

## 2013-10-26 MED ORDER — ACETAMINOPHEN 325 MG PO TABS
650.0000 mg | ORAL_TABLET | Freq: Four times a day (QID) | ORAL | Status: DC | PRN
  Administered 2013-10-26 – 2013-10-27 (×2): 650 mg via ORAL
  Filled 2013-10-26 (×2): qty 2

## 2013-10-26 MED ORDER — DOCUSATE SODIUM 100 MG PO CAPS
100.0000 mg | ORAL_CAPSULE | Freq: Once | ORAL | Status: DC
  Filled 2013-10-26: qty 1

## 2013-10-26 MED ORDER — ALPRAZOLAM 0.5 MG PO TABS
1.0000 mg | ORAL_TABLET | Freq: Three times a day (TID) | ORAL | Status: AC | PRN
  Administered 2013-10-26 – 2013-10-27 (×3): 1 mg via ORAL
  Filled 2013-10-26 (×3): qty 2

## 2013-10-26 MED ORDER — HYDROMORPHONE HCL PF 1 MG/ML IJ SOLN
1.0000 mg | INTRAMUSCULAR | Status: DC | PRN
  Administered 2013-10-26 – 2013-10-29 (×21): 1 mg via INTRAVENOUS
  Filled 2013-10-26 (×23): qty 1

## 2013-10-26 MED ORDER — MAGNESIUM SULFATE 4000MG/100ML IJ SOLN
4.0000 g | Freq: Once | INTRAMUSCULAR | Status: AC
  Administered 2013-10-26: 4 g via INTRAVENOUS
  Filled 2013-10-26: qty 100

## 2013-10-26 MED ORDER — SODIUM CHLORIDE 0.9 % IV SOLN
1.0000 g | Freq: Once | INTRAVENOUS | Status: AC
  Administered 2013-10-26: 1 g via INTRAVENOUS
  Filled 2013-10-26: qty 10

## 2013-10-26 MED ORDER — ALUM & MAG HYDROXIDE-SIMETH 200-200-20 MG/5ML PO SUSP: 30.0000 mL | Freq: Four times a day (QID) | ORAL | Status: DC | PRN

## 2013-10-26 MED ORDER — ONDANSETRON HCL 4 MG PO TABS
4.0000 mg | ORAL_TABLET | Freq: Four times a day (QID) | ORAL | Status: DC | PRN
Start: 2013-10-26 — End: 2013-10-27

## 2013-10-26 MED ORDER — POLYETHYLENE GLYCOL 3350 17 G PO PACK: 17.0000 g | PACK | Freq: Every day | ORAL | Status: DC

## 2013-10-26 MED ORDER — GUAIFENESIN-DM 100-10 MG/5ML PO SYRP
5.0000 mL | ORAL_SOLUTION | ORAL | Status: DC | PRN
Start: 2013-10-26 — End: 2013-10-30
  Filled 2013-10-26: qty 5

## 2013-10-26 MED ORDER — METHYLPREDNISOLONE SODIUM SUCC 125 MG IJ SOLR
60.0000 mg | Freq: Two times a day (BID) | INTRAMUSCULAR | Status: DC
  Administered 2013-10-26: 60 mg via INTRAVENOUS
  Filled 2013-10-26 (×2): qty 0.96

## 2013-10-26 MED ORDER — ZOLPIDEM TARTRATE 5 MG PO TABS
5.0000 mg | ORAL_TABLET | Freq: Every evening | ORAL | Status: DC | PRN
  Administered 2013-10-26 – 2013-10-29 (×5): 5 mg via ORAL
  Filled 2013-10-26 (×5): qty 1

## 2013-10-26 MED ORDER — METHYLPREDNISOLONE SODIUM SUCC 125 MG IJ SOLR
60.0000 mg | INTRAMUSCULAR | Status: DC
  Filled 2013-10-26: qty 0.96
  Filled 2013-10-26: qty 2

## 2013-10-26 MED ORDER — POTASSIUM CHLORIDE 20 MEQ/15ML (10%) PO SOLN: 20.0000 meq | Freq: Two times a day (BID) | ORAL | Status: DC

## 2013-10-26 MED ORDER — HYDROMORPHONE HCL PF 1 MG/ML IJ SOLN
0.5000 mg | Freq: Once | INTRAMUSCULAR | Status: AC
  Administered 2013-10-26: 0.5 mg via INTRAVENOUS
  Filled 2013-10-26: qty 1

## 2013-10-26 MED ORDER — HEPARIN SODIUM (PORCINE) 5000 UNIT/ML IJ SOLN
5000.0000 [IU] | Freq: Three times a day (TID) | INTRAMUSCULAR | Status: DC
  Administered 2013-10-26 – 2013-10-30 (×13): 5000 [IU] via SUBCUTANEOUS
  Filled 2013-10-26 (×15): qty 1

## 2013-10-26 MED ORDER — OXYCODONE-ACETAMINOPHEN 5-325 MG PO TABS
1.0000 | ORAL_TABLET | ORAL | Status: DC | PRN
Start: 2013-10-26 — End: 2013-10-27
  Administered 2013-10-26 – 2013-10-27 (×3): 1 via ORAL
  Filled 2013-10-26 (×3): qty 1

## 2013-10-26 MED ORDER — CIPROFLOXACIN IN D5W 400 MG/200ML IV SOLN
400.0000 mg | Freq: Two times a day (BID) | INTRAVENOUS | Status: DC
  Administered 2013-10-26 – 2013-10-28 (×6): 400 mg via INTRAVENOUS
  Filled 2013-10-26 (×7): qty 200

## 2013-10-26 MED ORDER — POTASSIUM CHLORIDE IN NACL 40-0.9 MEQ/L-% IV SOLN
INTRAVENOUS | Status: DC
  Administered 2013-10-26: 02:00:00 via INTRAVENOUS
  Filled 2013-10-26 (×5): qty 1000

## 2013-10-26 MED ORDER — ACETAMINOPHEN 650 MG RE SUPP: 650.0000 mg | Freq: Four times a day (QID) | RECTAL | Status: DC | PRN

## 2013-10-26 MED ORDER — METRONIDAZOLE IN NACL 5-0.79 MG/ML-% IV SOLN
500.0000 mg | Freq: Three times a day (TID) | INTRAVENOUS | Status: DC
  Administered 2013-10-26 – 2013-10-28 (×6): 500 mg via INTRAVENOUS
  Filled 2013-10-26 (×9): qty 100

## 2013-10-26 MED ORDER — POTASSIUM CHLORIDE CRYS ER 20 MEQ PO TBCR
40.0000 meq | EXTENDED_RELEASE_TABLET | ORAL | Status: AC
  Administered 2013-10-26 (×2): 40 meq via ORAL
  Filled 2013-10-26 (×2): qty 2

## 2013-10-26 MED ORDER — METHYLPREDNISOLONE SODIUM SUCC 125 MG IJ SOLR
60.0000 mg | Freq: Two times a day (BID) | INTRAMUSCULAR | Status: DC
  Filled 2013-10-26: qty 0.96

## 2013-10-26 MED ORDER — ONDANSETRON HCL 4 MG/2ML IJ SOLN: 4.0000 mg | Freq: Four times a day (QID) | INTRAMUSCULAR | Status: DC | PRN

## 2013-10-26 NOTE — Progress Notes (Signed)
Per MD, observed patient taking potassium PO.  Pt tolerated well.

## 2013-10-26 NOTE — Progress Notes (Signed)
CRITICAL VALUE ALERT  Critical value received:  Calcium 6.3mg /dL, Magnesium 0.6 mg/dL  Date of notification:  10/26/2013   Time of notification:  0255   Critical value read back:yes  Nurse who received alert:  Renald Haithcock [place] Amirah Goerke, RN   MD notified (1st page):  Janyce Ellinger A. Donnal Debar, NP  Time of first page:  0315  MD notified (2nd page):  Time of second page:  Responding MD:  Kayren Eaves. Donnal Debar, NP  Time MD responded:  [phone]

## 2013-10-26 NOTE — Progress Notes (Signed)
PO potassium is not available in pyxis A & B, notified pharmacy for STAT medication.

## 2013-10-26 NOTE — H&P (Addendum)
PATIENT DETAILS Name: [name] Age: 33 y.o. Sex: female Date of Birth: 1980/09/27 Admit Date: 10/25/2013 WCB:[id number] TOM, MD   CHIEF COMPLAINT:  Rectal prolapse Diarrhea- 2-3 weeks Abdominal pain 2-3 weeks  HPI: [name] is a 33 y.o. female with a Past Medical History of Crohn's disease, chronic back pain who presents to the ED today with the above noted complaint. Per patient, she was discharged on 3/6 after being admitted for almost a similar issue. Since discharge she has not seen her primary care practitioner or a gastroenterologist. She presented today to the emergency room because of pain in the anal region, and it again felt like a rectum had prolapsed yesterday. She also gives a history of having profuse loose watery stools. She claims she is almost incontinent. She complains that all of these symptoms ongoing since last discharge. She also claims that she continues to have diffuse abdominal pain. She denies any vomiting. Denies any fever. She was evaluated in the emergency room, and found to have a rectal prolapse, the emergency room physician sedated the patient with etomidate and successfully reduced the rectal prolapse. Even after reduction of the rectal prolapse, patient continued to have abdominal pain, as is I was asked to admit this patient for further evaluation and treatment. Patient denies any fever, headache, chest pain, shortness of breath. There is no history of nausea or vomiting.   ALLERGIES:   Allergies  Allergen Reactions  . Penicillins Hives  . Doxycycline Other (See Comments)    unknown    PAST MEDICAL HISTORY: Past Medical History  Diagnosis Date  . VITAMIN B1 DEFICIENCY 09/21/2009  . B12 DEFICIENCY 04/28/2009  . ANXIETY 03/24/2009  . SMOKER 12/02/2009  . ADHD 12/02/2009  . COMMON MIGRAINE 02/05/2010  . HYPERTENSION 03/24/2009  . GERD 03/24/2009  . CR[facility]'S [facility] INTESTINE 05/19/2009  . Cellulitis and abscess of leg,  except foot 02/05/2010  . ECZEMA 05/20/2010  . 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 Cervicalgia 12/02/2009  . Unspecified backache 12/02/2009  . BURSITIS, RIGHT KNEE 02/05/2010  . HEADACHE, CHRONIC 03/24/2009  . Abdominal pain, unspecified site 03/24/2009  . OTITIS MEDIA, LEFT 08/12/2010  . Wheezing 08/12/2010  . Allergic rhinitis, cause unspecified 01/21/2011  . Chronic back pain 01/21/2011  . Chronic neck pain 08/04/2011  . Endometriosis 08/04/2011  . Osteoarthritis   . Fibromyalgia     PAST SURGICAL HISTORY: Past Surgical History  Procedure Laterality Date  . Endometrial ablation  01/2009  . Breast enhancement surgery  2004    MEDICATIONS AT HOME: Prior to Admission medications   Medication Sig Start Date End Date Taking? Authorizing Provider  cyclobenzaprine (FLEXERIL) 10 MG tablet Take 10 mg by mouth 3 (three) times daily as needed for muscle spasms.    Yes Historical Provider, MD  diphenhydrAMINE (SOMINEX) 25 MG tablet Take 25 mg by mouth at bedtime as needed for allergies or sleep.   Yes Historical Provider, MD  esomeprazole (NEXIUM) 40 MG capsule Take 40 mg by mouth 2 (two) times daily before a meal.   Yes Historical Provider, MD  fexofenadine (ALLEGRA) 180 MG tablet Take 180 mg by mouth daily as needed for allergies or rhinitis.   Yes Historical Provider, MD  ibuprofen (ADVIL,MOTRIN) 200 MG tablet Take 800 mg by mouth daily as needed for moderate pain.   Yes Historical Provider, MD  montelukast (SINGULAIR) 10 MG tablet Take 10 mg by mouth daily as needed (for shortness of breath).  Yes Historical Provider, MD  oxyCODONE-acetaminophen (PERCOCET/ROXICET) 5-325 MG per tablet Take 1 tablet by mouth every 4 (four) hours as needed for severe pain. 10/11/13  Yes Charlynne Cousins, MD  fexofenadine (ALLEGRA) 180 MG tablet Take 1 tablet (180 mg total) by mouth daily. 03/28/12 10/09/13  Biagio Borg, MD  polyethylene glycol [facility] Floria Raveling) packet Take 34 g by mouth daily. 10/25/13   Wendall Papa, MD    potassium chloride 20 MEQ/15ML (10%) SOLN Take 15 mLs (20 mEq total) by mouth 2 (two) times daily. 10/26/13   Wendall Papa, MD  senna-docusate (SENOKOT-S) 8.6-50 MG per tablet Take 1 tablet by mouth 2 (two) times daily. 10/25/13   Wendall Papa, MD    FAMILY HISTORY: Family History  Problem Relation Age of Onset  . Cancer Mother     breast  . Clotting disorder Mother   . Cancer Maternal Grandmother     Stomach Cancer  . Cancer Maternal Grandfather     Esophageal Cancer    SOCIAL HISTORY:  reports that she has been smoking Cigarettes.  She has been smoking about 0.50 packs per day. She has never used smokeless tobacco. She reports that she drinks alcohol. She reports that she does not use illicit drugs.  REVIEW OF SYSTEMS:  Constitutional:   No  weight loss, night sweats,  Fevers, chills, fatigue.  HEENT:    No headaches, Difficulty swallowing,Tooth/dental problems,Sore throat,  No sneezing, itching, ear ache, nasal congestion, post nasal drip,   Cardio-vascular: No chest pain,  Orthopnea, PND, swelling in lower extremities, anasarca,   dizziness, palpitations  GI:  No heartburn, indigestion,, nausea, vomiting  Resp: No shortness of breath with exertion or at rest.  No excess mucus, no productive cough, No non-productive cough,  No coughing up of blood.No change in color of mucus.No wheezing.No chest wall deformity  Skin:  no rash or lesions.  GU:  no dysuria, change in color of urine, no urgency or frequency.  No flank pain.  Musculoskeletal: No joint pain or swelling.  No decreased range of motion.  No back pain.  Psych: No change in mood or affect. No depression or anxiety.  No memory loss.   PHYSICAL EXAM: Blood pressure 191/124, pulse 81, temperature 98.5 F (36.9 C), temperature source Oral, resp. rate 20, weight 52.164 kg (115 lb), last menstrual period 10/10/2011, SpO2 99.00%.  General appearance :Awake, alert, not in any distress. Speech Clear. Not toxic  Looking HEENT: Atraumatic and Normocephalic, pupils equally reactive to light and accomodation Neck: supple, no JVD. No cervical lymphadenopathy.  Chest:Good air entry bilaterally, no added sounds  CVS: S1 S2 regular, no murmurs.  Abdomen: Bowel sounds present, diffusely tender but very soft. Nondistended.Mild swelling and erythema seen around the anal verge-after reduction of the rectal prolapse in the ED Extremities: B/L Lower Ext shows no edema, both legs are warm to touch Neurology:  Non focal Skin:No Rash Wounds:N/A  LABS ON ADMISSION:   Recent Labs  10/25/13 1704  NA 144  K 2.6*  CL 104  CO2 23  GLUCOSE 75  BUN 5*  CREATININE 0.63  CALCIUM 7.7*    Recent Labs  10/25/13 1704  AST 22  ALT 17  ALKPHOS 102  BILITOT 0.2*  PROT 6.0  ALBUMIN 2.7*    Recent Labs  10/25/13 1704  LIPASE 13    Recent Labs  10/25/13 1704  WBC 11.1*  NEUTROABS 8.0*  HGB 14.0  HCT 39.1  MCV 90.1  PLT 586*  No results found for this basename: CKTOTAL, CKMB, CKMBINDEX, TROPONINI,  in the last 72 hours No results found for this basename: DDIMER,  in the last 72 hours No components found with this basename: POCBNP,    RADIOLOGIC STUDIES ON ADMISSION: No results found.  ASSESSMENT AND PLAN: Present on Admission:  . Rectal prolapse -? Secondary to chronic diarrhea.  - Since this is his second episode, she would likely need a surgical consultation was she is inpatient. Will defer this to be done by the rounding physician in the morning.   Marland Kitchen CROHN'S DISEASE flare  - Patient complains of persistent abdominal pain and diarrhea. She is also hypokalemic.  - Would admit, keep n.p.o., start Solu-Medrol and empiric Cipro and Flagyl.  - Will check stool C. difficile PCR. We'll also check a ESR and CRP. If abdominal pain and diarrhea persists, she may need repeat imaging. - She will also likely need a gastroenterology consultation at some point, given this is her second presentation in  one month.   . Abdominal pain, generalized - Likely secondary to above, abdomen appears soft. Recently had a CT scan of the abdomen that did show extensive mucosal thickening within the terminal ileum which was compatible with terminal ileitis.  - As noted above, she will be placed on steroids, empiric antibiotics. If pain persists, she may need another imaging or gastroenterology consultation. We'll continue to follow clinical course.  Marland Kitchen Hypokalemia - Likely secondary to GI loss. Replete and recheck in am  Further plan will depend as patient's clinical course evolves and further radiologic and laboratory data become available. Patient will be monitored closely.  Above noted plan was discussed with patient/family, they were in agreement.   DVT Prophylaxis: Prophylactic Heparin  Code Status: Full Code  Total time spent for admission equals 45 minutes.  [facility] Pager [phone]  If 7PM-7AM, please contact night-coverage [email or web address] Password [facility] 10/26/2013, 12:58 AM

## 2013-10-26 NOTE — Progress Notes (Signed)
Utilization Review Completed.[name] T3/21/2015  

## 2013-10-26 NOTE — ED Notes (Signed)
Admitting MD at BS.  

## 2013-10-26 NOTE — Progress Notes (Signed)
Patient Demographics  [name], is a 33 y.o. female, DOB - 08-Nov-1980, MBT:[id number]  Admit date - 10/25/2013   Admitting Physician Evalee Mutton Kristeen Mans, MD  Outpatient Primary MD for the patient is Odette Fraction, MD  LOS - 1   Chief Complaint  Patient presents with  . rectal prolapse         Assessment & Plan    . Rectal prolapse  -? Secondary to chronic diarrhea.  - Currently reduced in stable - Called general surgeon Dr. Donne Hazel for rectal prolapse she recommended that he has nothing to offer in the inpatient setting and patient should follow with Dr. Reeves Forth in our office in one to 2 day post DC      . CROHN'S DISEASE flare abdominal pain and diarrhea. Recent CT scan as below - He already looks better and in no discomfort, continue IV Cipro Flagyl and IV steroids. Recent CT scan noted. -He has improved we'll monitor. If better we'll place on oral Cipro Flagyl and oral steroids and discharged home in the next few days. -Continue IV fluids, she was found eating a sandwich hidden under her bedsheet yesterday night, for now we'll place her on clear liquids. She was counseled by the nursing staff not to advance diet until told to do so.   -She continues to demand IV narcotics despite appearing comfortable. -She has been fired by local GI clinic, Called GI Dr. Fuller Plan - who suggested that he will not see the patient here as she has been fired from his office, he recommends patient followup in the outpatient setting with some other GI physician, currently nothing to offer an inpatient setting.     . Hypokalemia and hypomagnesemia - Likely secondary to GI loss. Replete and recheck       Code Status: Full  Family Communication: Husband  Disposition Plan:  Home   Procedures CT abdomen pelvis   Consults    Called general surgeon Dr. Donne Hazel for rectal prolapse she recommended that he has nothing to offer in the inpatient setting and patient should follow with Dr. Reeves Forth in our office in one to 2 day post DC.   Called GI Dr. Fuller Plan - who suggested that he will not see the patient here as she has been fired from his office, he recommends patient followup in the outpatient setting with some other GI physician, currently nothing to offer an inpatient setting.    Medications  Scheduled Meds: . ciprofloxacin  400 mg Intravenous BID  . heparin  5,000 Units Subcutaneous 3 times per day  . [START ON 10/27/2013] methylPREDNISolone (SOLU-MEDROL) injection  60 mg Intravenous Q24H  . metronidazole  500 mg Intravenous Q8H   Continuous Infusions: . 0.9 % NaCl with KCl 40 mEq / L 125 mL/hr at 10/26/13 0207   PRN Meds:.acetaminophen, acetaminophen, albuterol, alum & mag hydroxide-simeth, guaiFENesin-dextromethorphan, HYDROmorphone (DILAUDID) injection, ondansetron (ZOFRAN) IV, ondansetron, zolpidem  DVT Prophylaxis    Heparin    Lab Results  Component Value Date   PLT 530* 10/26/2013    Antibiotics    Anti-infectives   Start     Dose/Rate Route Frequency Ordered Stop   10/26/13 0200  metroNIDAZOLE (FLAGYL) IVPB 500 mg     500 mg  100 mL/hr over 60 Minutes Intravenous Every 8 hours 10/26/13 0115     10/26/13 0130  ciprofloxacin (CIPRO) IVPB 400 mg  Status:  Discontinued     400 mg 200 mL/hr over 60 Minutes Intravenous Every 12 hours 10/26/13 0115 10/26/13 0125   10/26/13 0130  ciprofloxacin (CIPRO) IVPB 400 mg     400 mg 200 mL/hr over 60 Minutes Intravenous 2 times daily 10/26/13 0125            Subjective:   [name] today has, No headache, No chest pain, in no discomfort but complains of mild generalized abdominal pain - No Nausea, No new weakness tingling or numbness, No Cough - SOB. She wants to eat  food.  Objective:   Filed Vitals:   10/26/13 0000 10/26/13 0041 10/26/13 0132 10/26/13 0520  BP: 170/126 191/124 163/116 127/80  Pulse: 80 81 83 80  Temp:  98.5 F (36.9 C) 98.3 F (36.8 C) 98 F (36.7 C)  TempSrc:  Oral Oral Oral  Resp: 15 20 20 20   Height:   5' 2"  (1.575 m)   Weight:   52.164 kg (115 lb)   SpO2: 99% 99% 99% 98%    Wt Readings from Last 3 Encounters:  10/26/13 52.164 kg (115 lb)  10/11/13 58.695 kg (129 lb 6.4 oz)  03/08/13 52.617 kg (116 lb)     Intake/Output Summary (Last 24 hours) at 10/26/13 1105 Last data filed at 10/26/13 0703  Gross per 24 hour  Intake 1126.67 ml  Output      0 ml  Net 1126.67 ml     Physical Exam  Awake Alert, Oriented X 3, No new F.N deficits, Normal affect [place].AT,PERRAL Supple Neck,No JVD, No cervical lymphadenopathy appriciated.  Symmetrical Chest wall movement, Good air movement bilaterally, CTAB RRR,No Gallops,Rubs or new Murmurs, No Parasternal Heave +ve B.Sounds, Abd Soft, Non tender, No organomegaly appriciated, No rebound - guarding or rigidity. External hemorrhoids but no rectal prolapse No Cyanosis, Clubbing or edema, No new Rash or bruise      Data Review   Micro Results Recent Results (from the past 240 hour(s))  CLOSTRIDIUM DIFFICILE BY PCR     Status: None   Collection Time    10/26/13  2:13 AM      Result Value Ref Range Status   C difficile by pcr NEGATIVE  NEGATIVE Final    Radiology Reports  Ct Abdomen Pelvis W Contrast  10/09/2013   CLINICAL DATA:  The progressive rectal bleeding. History of Crohn's disease and ulcerative colitis.  EXAM: CT ABDOMEN AND PELVIS WITH CONTRAST  TECHNIQUE: Multidetector CT imaging of the abdomen and pelvis was performed using the standard protocol following bolus administration of intravenous contrast.  CONTRAST:  18m OMNIPAQUE IOHEXOL 300 MG/ML  SOLN  COMPARISON:  CT abdomen and pelvis 10/22/2010.  FINDINGS: Bilateral breast implants are present. The heart size is  normal. The lungs are clear.  The liver and spleen are within normal limits. The stomach, duodenum, and pancreas are within normal limits. The common bile duct is dilated, measuring 8.5 mm at pancreatic head. No obstructing lesion is present. The gallbladder is within normal limits. The adrenal glands and kidneys are normal bilaterally. The ureters are unremarkable.  Marked mucosal thickening and narrowing is present within the terminal ileum. The anastomosis is intact. There is no obstruction. Mild edematous changes are present at the rectum. The sigmoid colon is within normal limits. The descending, transverse, and distal ascending colon is normal. There  is some inflammatory change about the cecum. The appendix is visualized and normal. The more proximal small bowel is unremarkable.  The bone windows are unremarkable.  IMPRESSION: 1. Extensive mucosal thickening within the terminal ileum without evidence for obstruction. This is compatible with terminal ileitis and consistent with the given diagnosis of Crohn's disease. 2. Mild inflammatory changes about the cecum. The remainder of the colon is unremarkable. 3. The inflammatory changes within the rectum. 4. Chronic dilation of the common bile duct without evidence for an obstructing lesion.   Electronically Signed   By: Lawrence Santiago M.D.   On: 10/09/2013 21:21    CBC  Recent Labs Lab 10/25/13 1704 10/26/13 0147  WBC 11.1* 13.6*  HGB 14.0 12.2  HCT 39.1 35.4*  PLT 586* 530*  MCV 90.1 89.6  MCH 32.3 30.9  MCHC 35.8 34.5  RDW 15.9* 15.9*  LYMPHSABS 2.0  --   MONOABS 1.0  --   EOSABS 0.0  --   BASOSABS 0.1  --     Chemistries   Recent Labs Lab 10/25/13 1704 10/26/13 0147  NA 144 143  K 2.6* 3.0*  CL 104 108  CO2 23 20  GLUCOSE 75 106*  BUN 5* 5*  CREATININE 0.63 0.60  0.60  CALCIUM 7.7* 6.3*  MG  --  0.6*  AST 22  --   ALT 17  --   ALKPHOS 102  --   BILITOT 0.2*  --     ------------------------------------------------------------------------------------------------------------------ estimated creatinine clearance is 79.8 ml/min (by C-G formula based on Cr of 0.6). ------------------------------------------------------------------------------------------------------------------ No results found for this basename: HGBA1C,  in the last 72 hours ------------------------------------------------------------------------------------------------------------------ No results found for this basename: CHOL, HDL, LDLCALC, TRIG, CHOLHDL, LDLDIRECT,  in the last 72 hours ------------------------------------------------------------------------------------------------------------------ No results found for this basename: TSH, T4TOTAL, FREET3, T3FREE, THYROIDAB,  in the last 72 hours ------------------------------------------------------------------------------------------------------------------ No results found for this basename: VITAMINB12, FOLATE, FERRITIN, TIBC, IRON, RETICCTPCT,  in the last 72 hours  Coagulation profile No results found for this basename: INR, PROTIME,  in the last 168 hours  No results found for this basename: DDIMER,  in the last 72 hours  Cardiac Enzymes No results found for this basename: CK, CKMB, TROPONINI, MYOGLOBIN,  in the last 168 hours ------------------------------------------------------------------------------------------------------------------ No components found with this basename: POCBNP,      Time Spent in minutes   35   SINGH,PRASHANT K M.D on 10/26/2013 at 11:05 AM  Between 7am to 7pm - Pager - [phone]  After 7pm go to [email or web address] - password [email or web address]  And look for the night coverage person covering for me after hours  [facility] Office  [phone]

## 2013-10-26 NOTE — Progress Notes (Signed)
Patient arrived to [facility] at 01:15 and Dr. Sloan Leiter assessed patient at the bedside. Patient made aware of NPO status although patient continues to inquire about ice chips. Patient educated on the reasons for the NPO status and that a surgeon will consulted in the AM regarding patient's rectal prolapse, per Dr. Sloan Leiter. Patient's husband leaves the unit and arrives back to the unit with food. Patient reminded of NPO status and importance of not eating or drinking. Patient suspected to have sandwich in bed when nurse walks in, patient abruptly covers the sandwich up with the covers. Nurse once again reminded patient and boyfriend of NPO status, both agree. I notified Walden Field, NP of [facility] about incident with the patient eating and drinking. Nursing will continue to monitor.

## 2013-10-26 NOTE — Progress Notes (Signed)
Patient has c/o IV site burning, no noted redness or edema.  Another IV site maybe needed, patient has refused so far, will attempt again and MD to be notified.

## 2013-10-27 ENCOUNTER — Observation Stay (HOSPITAL_COMMUNITY): Payer: Medicaid Other

## 2013-10-27 DIAGNOSIS — R1084 Generalized abdominal pain: Secondary | ICD-10-CM

## 2013-10-27 DIAGNOSIS — K5080 Crohn's disease of both small and large intestine without complications: Secondary | ICD-10-CM

## 2013-10-27 DIAGNOSIS — K623 Rectal prolapse: Principal | ICD-10-CM

## 2013-10-27 LAB — BASIC METABOLIC PANEL
BUN: 6 mg/dL (ref 6–23)
CO2: 22 mEq/L (ref 19–32)
Calcium: 7.4 mg/dL — ABNORMAL LOW (ref 8.4–10.5)
Chloride: 108 mEq/L (ref 96–112)
Creatinine, Ser: 0.65 mg/dL (ref 0.50–1.10)
GFR calc Af Amer: >90 mL/min (ref >90)
GFR calc non Af Amer: >90 mL/min (ref >90)
Glucose, Bld: 96 mg/dL (ref 70–99)
Potassium: 3.7 mEq/L (ref 3.7–5.3)
Sodium: 142 mEq/L (ref 137–147)

## 2013-10-27 LAB — MAGNESIUM: Magnesium: 1.6 mg/dL (ref 1.5–2.5)

## 2013-10-27 IMAGING — CR DG ABDOMEN 2V
2 series · 2 of 2 positions shown · non-contrast
Comparison: CT abdomen and pelvis [DATE]

CLINICAL DATA: Abdominal pain, history rectal prolapse, Crohn's
disease, endometriosis, smoking, hypertension

EXAM:
ABDOMEN - 2 VIEW

[w abdomen upright]
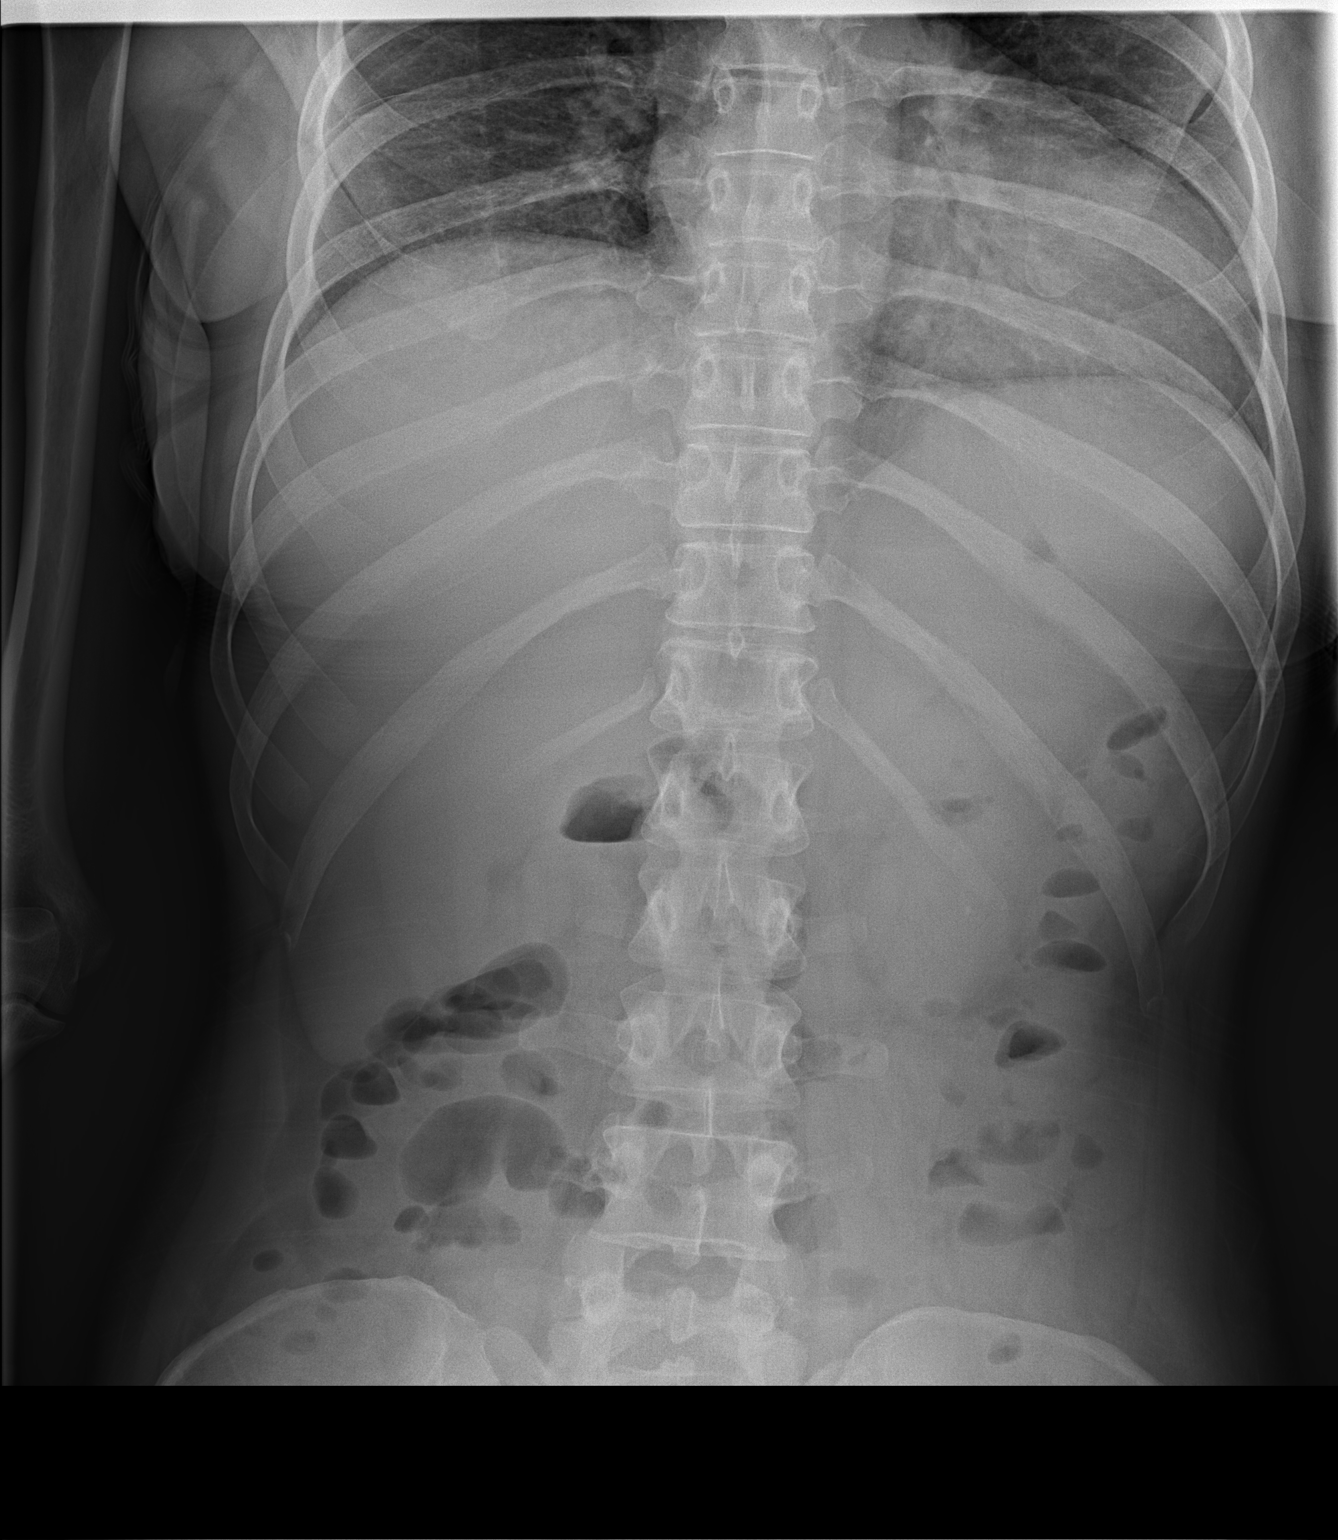

[t abdomen supine]
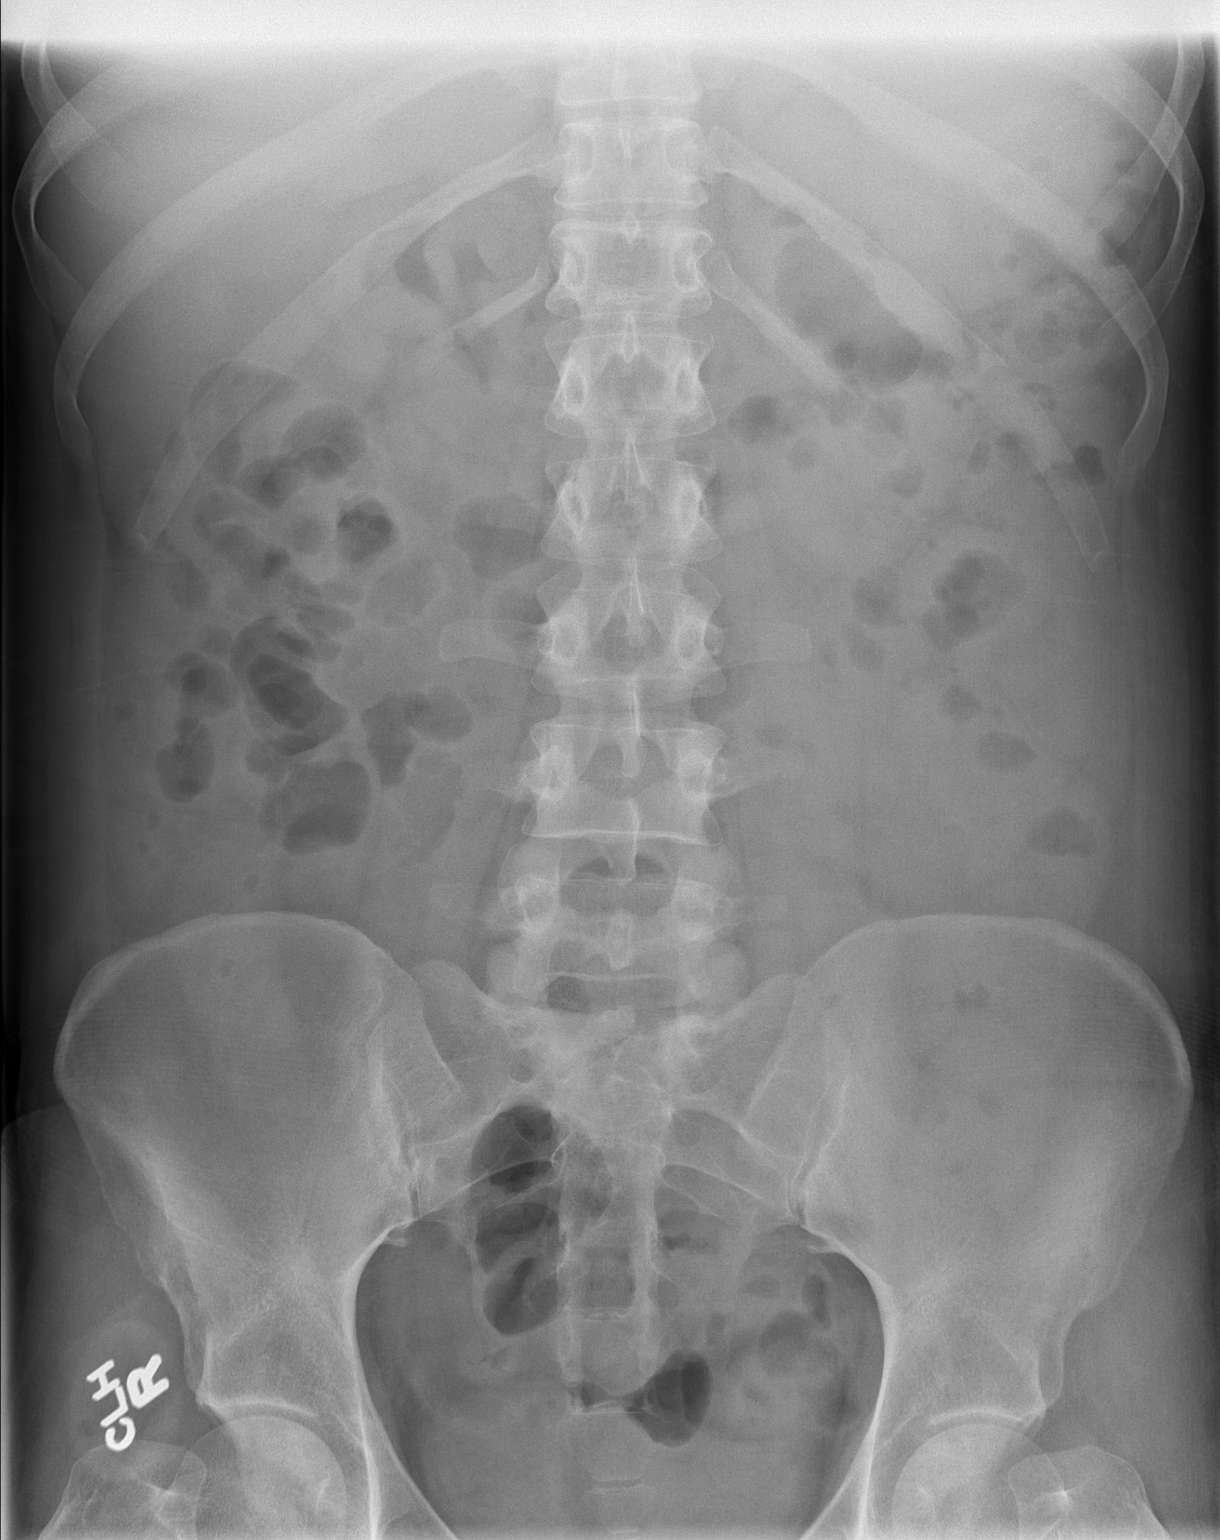

[2 of 2 positions shown; findings below may reference images not displayed]

FINDINGS: Normal bowel gas pattern.

No bowel dilatation, bowel wall thickening or free intraperitoneal
air.

Bowel wall thickening seen at the ilium on the previous exam is not
definitely visualized on current radiographs.

Bones unremarkable.
IMPRESSION: Normal bowel gas pattern.

## 2013-10-27 MED ORDER — MAGNESIUM SULFATE IN D5W 10-5 MG/ML-% IV SOLN
1.0000 g | Freq: Once | INTRAVENOUS | Status: AC
  Administered 2013-10-27: 1 g via INTRAVENOUS
  Filled 2013-10-27: qty 100

## 2013-10-27 MED ORDER — POTASSIUM CHLORIDE IN NACL 20-0.9 MEQ/L-% IV SOLN
INTRAVENOUS | Status: DC
  Administered 2013-10-27: 09:00:00 via INTRAVENOUS
  Filled 2013-10-27 (×4): qty 1000

## 2013-10-27 MED ORDER — PANTOPRAZOLE SODIUM 40 MG PO TBEC
40.0000 mg | DELAYED_RELEASE_TABLET | Freq: Every day | ORAL | Status: DC
Start: 2013-10-27 — End: 2013-10-30
  Administered 2013-10-27 – 2013-10-30 (×4): 40 mg via ORAL
  Filled 2013-10-27 (×4): qty 1

## 2013-10-27 MED ORDER — METHYLPREDNISOLONE SODIUM SUCC 40 MG IJ SOLR
40.0000 mg | Freq: Every day | INTRAMUSCULAR | Status: DC
  Administered 2013-10-27 – 2013-10-29 (×3): 40 mg via INTRAVENOUS
  Filled 2013-10-27 (×3): qty 1

## 2013-10-27 MED ORDER — OXYCODONE HCL 5 MG PO TABS
10.0000 mg | ORAL_TABLET | ORAL | Status: DC | PRN
  Administered 2013-10-27 – 2013-10-30 (×18): 10 mg via ORAL
  Filled 2013-10-27 (×18): qty 2

## 2013-10-27 MED ORDER — POTASSIUM CHLORIDE CRYS ER 20 MEQ PO TBCR
40.0000 meq | EXTENDED_RELEASE_TABLET | Freq: Once | ORAL | Status: AC
  Administered 2013-10-27: 40 meq via ORAL
  Filled 2013-10-27: qty 2

## 2013-10-27 MED ORDER — PNEUMOCOCCAL VAC POLYVALENT 25 MCG/0.5ML IJ INJ
0.5000 mL | INJECTION | INTRAMUSCULAR | Status: AC
  Administered 2013-10-30: 0.5 mL via INTRAMUSCULAR
  Filled 2013-10-27: qty 0.5

## 2013-10-27 NOTE — Progress Notes (Signed)
Pt had a sandwich on her bedside table last night. Stated that she had not eaten the sandwich, however when questioned this morning following severe abdominal pain, she admitted that she had eaten half of the sandwich. Informed pt that MD had made her NPO and that she needed to follow these instructions in order to allow her GI tract to rest. All food and drink removed from pt's room.

## 2013-10-27 NOTE — Progress Notes (Signed)
Patient Demographics  [name], is a 33 y.o. female, DOB - 1981/03/06, LJQ:[id number]  Admit date - 10/25/2013   Admitting Physician Evalee Mutton Kristeen Mans, MD  Outpatient Primary MD for the patient is Odette Fraction, MD  LOS - 2   Chief Complaint  Patient presents with  . rectal prolapse         Assessment & Plan    . Rectal prolapse  -? Secondary to chronic diarrhea.  - Currently reduced in stable - Called general surgeon Dr. Donne Hazel for rectal prolapse she recommended that he has nothing to offer in the inpatient setting and patient should follow with Dr. Reeves Forth in our office in one to 2 day post DC      . CROHN'S DISEASE flare abdominal pain and diarrhea. Recent CT scan as below - He already looks better and in no discomfort, continue IV Cipro Flagyl and IV steroids. Recent CT scan noted. -He has improved we'll monitor. If better we'll place on oral Cipro Flagyl and oral steroids and discharged home in the next few days. -Continue IV fluids, she was found eating a sandwich hidden under her bedsheet yesterday night, for now we'll place her on clear liquids. She was counseled by the nursing staff not to advance diet until told to do so.   -She continues to demand IV narcotics despite appearing comfortable. -She has been fired by local GI clinic, Called GI Dr. Fuller Plan -wii see her today.     . Hypokalemia and hypomagnesemia - Likely secondary to GI loss. Replete and recheck       Code Status: Full  Family Communication: Husband  Disposition Plan: Home   Procedures CT abdomen pelvis   Consults    Called general surgeon Dr. Donne Hazel for rectal prolapse she recommended that he has nothing to offer in the inpatient setting and patient should follow with Dr.  Reeves Forth in our office in one to 2 day post DC.   Called GI Dr. Fuller Plan    Medications  Scheduled Meds: . ciprofloxacin  400 mg Intravenous BID  . heparin  5,000 Units Subcutaneous 3 times per day  . methylPREDNISolone (SOLU-MEDROL) injection  60 mg Intravenous Q24H  . metronidazole  500 mg Intravenous Q8H  . pantoprazole  40 mg Oral Daily  . [START ON 10/28/2013] pneumococcal 23 valent vaccine  0.5 mL Intramuscular Tomorrow-1000   Continuous Infusions: . 0.9 % NaCl with KCl 20 mEq / L 75 mL/hr at 10/27/13 0832   PRN Meds:.albuterol, ALPRAZolam, alum & mag hydroxide-simeth, guaiFENesin-dextromethorphan, HYDROmorphone (DILAUDID) injection, ondansetron (ZOFRAN) IV, oxyCODONE, zolpidem  DVT Prophylaxis    Heparin    Lab Results  Component Value Date   PLT 530* 10/26/2013    Antibiotics    Anti-infectives   Start     Dose/Rate Route Frequency Ordered Stop   10/26/13 0200  metroNIDAZOLE (FLAGYL) IVPB 500 mg     500 mg 100 mL/hr over 60 Minutes Intravenous Every 8 hours 10/26/13 0115     10/26/13 0130  ciprofloxacin (CIPRO) IVPB 400 mg  Status:  Discontinued     400 mg 200 mL/hr over 60 Minutes Intravenous Every 12 hours 10/26/13 0115 10/26/13 0125   10/26/13 0130  ciprofloxacin (CIPRO) IVPB 400 mg  400 mg 200 mL/hr over 60 Minutes Intravenous 2 times daily 10/26/13 0125            Subjective:   Marjory Sneddon today has, No headache, No chest pain, in no discomfort but complains of mild generalized abdominal pain - No Nausea, No new weakness tingling or numbness, No Cough - SOB. She wants to eat food.  Objective:   Filed Vitals:   10/26/13 0520 10/26/13 1221 10/26/13 2100 10/27/13 0635  BP: 127/80 129/86 150/90 147/99  Pulse: 80 79 67 75  Temp: 98 F (36.7 C) 98.4 F (36.9 C) 97.8 F (36.6 C) 97.6 F (36.4 C)  TempSrc: Oral Oral  Oral  Resp: 20 20 18 16   Height:      Weight:      SpO2: 98% 98% 96% 100%    Wt Readings from Last 3 Encounters:    10/26/13 52.164 kg (115 lb)  10/11/13 58.695 kg (129 lb 6.4 oz)  03/08/13 52.617 kg (116 lb)     Intake/Output Summary (Last 24 hours) at 10/27/13 0933 Last data filed at 10/27/13 0700  Gross per 24 hour  Intake   2050 ml  Output      0 ml  Net   2050 ml     Physical Exam  Awake Alert, Oriented X 3, No new F.N deficits, Normal affect [place].AT,PERRAL Supple Neck,No JVD, No cervical lymphadenopathy appriciated.  Symmetrical Chest wall movement, Good air movement bilaterally, CTAB RRR,No Gallops,Rubs or new Murmurs, No Parasternal Heave +ve B.Sounds, Abd Soft, Non tender, No organomegaly appriciated, No rebound - guarding or rigidity. External hemorrhoids but no rectal prolapse No Cyanosis, Clubbing or edema, No new Rash or bruise      Data Review   Micro Results Recent Results (from the past 240 hour(s))  CLOSTRIDIUM DIFFICILE BY PCR     Status: None   Collection Time    10/26/13  2:13 AM      Result Value Ref Range Status   C difficile by pcr NEGATIVE  NEGATIVE Final    Radiology Reports  Ct Abdomen Pelvis W Contrast  10/09/2013   CLINICAL DATA:  The progressive rectal bleeding. History of Crohn's disease and ulcerative colitis.  EXAM: CT ABDOMEN AND PELVIS WITH CONTRAST  TECHNIQUE: Multidetector CT imaging of the abdomen and pelvis was performed using the standard protocol following bolus administration of intravenous contrast.  CONTRAST:  190m OMNIPAQUE IOHEXOL 300 MG/ML  SOLN  COMPARISON:  CT abdomen and pelvis 10/22/2010.  FINDINGS: Bilateral breast implants are present. The heart size is normal. The lungs are clear.  The liver and spleen are within normal limits. The stomach, duodenum, and pancreas are within normal limits. The common bile duct is dilated, measuring 8.5 mm at pancreatic head. No obstructing lesion is present. The gallbladder is within normal limits. The adrenal glands and kidneys are normal bilaterally. The ureters are unremarkable.  Marked mucosal  thickening and narrowing is present within the terminal ileum. The anastomosis is intact. There is no obstruction. Mild edematous changes are present at the rectum. The sigmoid colon is within normal limits. The descending, transverse, and distal ascending colon is normal. There is some inflammatory change about the cecum. The appendix is visualized and normal. The more proximal small bowel is unremarkable.  The bone windows are unremarkable.  IMPRESSION: 1. Extensive mucosal thickening within the terminal ileum without evidence for obstruction. This is compatible with terminal ileitis and consistent with the given diagnosis of Crohn's disease. 2. Mild inflammatory  changes about the cecum. The remainder of the colon is unremarkable. 3. The inflammatory changes within the rectum. 4. Chronic dilation of the common bile duct without evidence for an obstructing lesion.   Electronically Signed   By: Lawrence Santiago M.D.   On: 10/09/2013 21:21    CBC  Recent Labs Lab 10/25/13 1704 10/26/13 0147  WBC 11.1* 13.6*  HGB 14.0 12.2  HCT 39.1 35.4*  PLT 586* 530*  MCV 90.1 89.6  MCH 32.3 30.9  MCHC 35.8 34.5  RDW 15.9* 15.9*  LYMPHSABS 2.0  --   MONOABS 1.0  --   EOSABS 0.0  --   BASOSABS 0.1  --     Chemistries   Recent Labs Lab 10/25/13 1704 10/26/13 0147 10/26/13 1130 10/26/13 2208 10/27/13 0500  NA 144 143  --   --  142  K 2.6* 3.0* 2.8* 3.4* 3.7  CL 104 108  --   --  108  CO2 23 20  --   --  22  GLUCOSE 75 106*  --   --  96  BUN 5* 5*  --   --  6  CREATININE 0.63 0.60  0.60  --   --  0.65  CALCIUM 7.7* 6.3*  --   --  7.4*  MG  --  0.6* 2.2  --  1.6  AST 22  --   --   --   --   ALT 17  --   --   --   --   ALKPHOS 102  --   --   --   --   BILITOT 0.2*  --   --   --   --    ------------------------------------------------------------------------------------------------------------------ estimated creatinine clearance is 79.8 ml/min (by C-G formula based on Cr of  0.65). ------------------------------------------------------------------------------------------------------------------ No results found for this basename: HGBA1C,  in the last 72 hours ------------------------------------------------------------------------------------------------------------------ No results found for this basename: CHOL, HDL, LDLCALC, TRIG, CHOLHDL, LDLDIRECT,  in the last 72 hours ------------------------------------------------------------------------------------------------------------------ No results found for this basename: TSH, T4TOTAL, FREET3, T3FREE, THYROIDAB,  in the last 72 hours ------------------------------------------------------------------------------------------------------------------ No results found for this basename: VITAMINB12, FOLATE, FERRITIN, TIBC, IRON, RETICCTPCT,  in the last 72 hours  Coagulation profile No results found for this basename: INR, PROTIME,  in the last 168 hours  No results found for this basename: DDIMER,  in the last 72 hours  Cardiac Enzymes No results found for this basename: CK, CKMB, TROPONINI, MYOGLOBIN,  in the last 168 hours ------------------------------------------------------------------------------------------------------------------ No components found with this basename: POCBNP,      Time Spent in minutes   35   Verle Brillhart K M.D on 10/27/2013 at 9:33 AM  Between 7am to 7pm - Pager - [phone]  After 7pm go to [email or web address] - password [email or web address]  And look for the night coverage person covering for me after hours  [facility] Office  [phone]

## 2013-10-27 NOTE — Consult Note (Signed)
Referring Provider: No ref. provider found Primary Care Physician:  Odette Fraction, MD Primary Gastroenterologist:  Althia Forts, discharged from [facility] in 2012  Reason for Consultation:  Crohns disease with diarrhea and abdominal pain  HPI: [name] is a 33 y.o. female with Crohn's disease and chronic back pain who presents to the ED on 3/20 with complaints of abdominal pain and diarrhea along with rectal prolapse. Per patient, she was discharged on 3/6 after being admitted for almost a similar issue. Since discharge she has not seen her primary care practitioner or a gastroenterologist.  She was discharged from [facility] in 2012 and has not established care with another physician since that time.  She gives a history of having profuse loose watery stools. She claims she is almost incontinent.  Also has chronic abdominal pain.  Says that the diarrhea and abdominal pain have been an ongoing issue, but somewhat worse recently.  She says that she has frequent vomiting at home. Denies any fever.  Has blood in stools on occasion but more frequently now due to rectal prolapse.  She was evaluated in the emergency room on this occasion and found to have a rectal prolapse, the emergency room physician sedated the patient with etomidate and successfully reduced the rectal prolapse. Even after reduction of the rectal prolapse, patient continued to have abdominal pain and she was admitted for further evaluation and treatment.  CT scan was performed and revealed the following:  IMPRESSION:  1. Extensive mucosal thickening within the terminal ileum without  evidence for obstruction. This is compatible with terminal ileitis  and consistent with the given diagnosis of Crohn's disease.  2. Mild inflammatory changes about the cecum. The remainder of the colon is unremarkable.  3. The inflammatory changes within the rectum.  4. Chronic dilation of the common bile duct without evidence for an   obstructing lesion.  Surgery was called rectal her rectal prolapse, but stated that there was no inpatient issue to be addressed and that she should follow-up with Dr. Marcello Moores as an outpatient.  She has already been placed on IV methylprednisolone at 60 mg BID as well IV cipro 400 mg BID and IV flagyl 500 mg TID.    Her last colonoscopy was in 03/2009 by Dr. Sharlett Iles at which time she was found to have terminal ileitis.  Initially she had hypokalemia at 2.8, which has improved.  Sed rated and CRP are normal.  Cdiff is negative.   Past Medical History  Diagnosis Date  . VITAMIN B1 DEFICIENCY 09/21/2009  . B12 DEFICIENCY 04/28/2009  . ANXIETY 03/24/2009  . SMOKER 12/02/2009  . ADHD 12/02/2009  . COMMON MIGRAINE 02/05/2010  . HYPERTENSION 03/24/2009  . GERD 03/24/2009  . CROHN'S [facility] INTESTINE 05/19/2009  . Cellulitis and abscess of leg, except foot 02/05/2010  . ECZEMA 05/20/2010  . 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 Cervicalgia 12/02/2009  . Unspecified backache 12/02/2009  . BURSITIS, RIGHT KNEE 02/05/2010  . HEADACHE, CHRONIC 03/24/2009  . Abdominal pain, unspecified site 03/24/2009  . OTITIS MEDIA, LEFT 08/12/2010  . Wheezing 08/12/2010  . Allergic rhinitis, cause unspecified 01/21/2011  . Chronic back pain 01/21/2011  . Chronic neck pain 08/04/2011  . Endometriosis 08/04/2011  . Osteoarthritis   . Fibromyalgia     Past Surgical History  Procedure Laterality Date  . Endometrial ablation  01/2009  . Breast enhancement surgery  2004    Prior to Admission medications   Medication Sig Start Date End Date Taking? Authorizing Provider  cyclobenzaprine (FLEXERIL) 10 MG tablet Take 10 mg by mouth 3 (three) times daily as needed for muscle spasms.    Yes Historical Provider, MD  diphenhydrAMINE (SOMINEX) 25 MG tablet Take 25 mg by mouth at bedtime as needed for allergies or sleep.   Yes Historical Provider, MD  esomeprazole (NEXIUM) 40 MG capsule Take 40 mg by mouth 2 (two) times daily before a  meal.   Yes Historical Provider, MD  fexofenadine (ALLEGRA) 180 MG tablet Take 180 mg by mouth daily as needed for allergies or rhinitis.   Yes Historical Provider, MD  ibuprofen (ADVIL,MOTRIN) 200 MG tablet Take 800 mg by mouth daily as needed for moderate pain.   Yes Historical Provider, MD  montelukast (SINGULAIR) 10 MG tablet Take 10 mg by mouth daily as needed (for shortness of breath).   Yes Historical Provider, MD  oxyCODONE-acetaminophen (PERCOCET/ROXICET) 5-325 MG per tablet Take 1 tablet by mouth every 4 (four) hours as needed for severe pain. 10/11/13  Yes Charlynne Cousins, MD  fexofenadine (ALLEGRA) 180 MG tablet Take 1 tablet (180 mg total) by mouth daily. 03/28/12 10/09/13  Biagio Borg, MD  polyethylene glycol [facility] / Floria Raveling) packet Take 34 g by mouth daily. 10/25/13   Wendall Papa, MD  potassium chloride 20 MEQ/15ML (10%) SOLN Take 15 mLs (20 mEq total) by mouth 2 (two) times daily. 10/26/13   Wendall Papa, MD  senna-docusate (SENOKOT-S) 8.6-50 MG per tablet Take 1 tablet by mouth 2 (two) times daily. 10/25/13   Wendall Papa, MD    Current Facility-Administered Medications  Medication Dose Route Frequency Provider Last Rate Last Dose  . 0.9 % NaCl with KCl 20 mEq/ L  infusion   Intravenous Continuous Thurnell Lose, MD 75 mL/hr at 10/27/13 4098    . albuterol (PROVENTIL) (2.5 MG/3ML) 0.083% nebulizer solution 2.5 mg  2.5 mg Nebulization Q2H PRN Jonetta Osgood, MD      . ALPRAZolam Duanne Moron) tablet 1 mg  1 mg Oral TID PRN Dianne Dun, NP   1 mg at 10/27/13 1191  . alum & mag hydroxide-simeth (MAALOX/MYLANTA) 200-200-20 MG/5ML suspension 30 mL  30 mL Oral Q6H PRN Jonetta Osgood, MD      . ciprofloxacin (CIPRO) IVPB 400 mg  400 mg Intravenous BID Jonetta Osgood, MD   400 mg at 10/26/13 2128  . guaiFENesin-dextromethorphan (ROBITUSSIN DM) 100-10 MG/5ML syrup 5 mL  5 mL Oral Q4H PRN Jonetta Osgood, MD      . heparin injection 5,000 Units  5,000 Units  Subcutaneous 3 times per day Jonetta Osgood, MD   5,000 Units at 10/27/13 0600  . HYDROmorphone (DILAUDID) injection 1 mg  1 mg Intravenous Q3H PRN Jonetta Osgood, MD   1 mg at 10/27/13 0809  . magnesium sulfate IVPB 1 g 100 mL  1 g Intravenous Once Thurnell Lose, MD      . methylPREDNISolone sodium succinate (SOLU-MEDROL) 125 mg/2 mL injection 60 mg  60 mg Intravenous Q24H Thurnell Lose, MD      . metroNIDAZOLE (FLAGYL) IVPB 500 mg  500 mg Intravenous Q8H Jonetta Osgood, MD   500 mg at 10/27/13 0148  . ondansetron (ZOFRAN) injection 4 mg  4 mg Intravenous Q6H PRN Shanker Kristeen Mans, MD      . oxyCODONE (Oxy IR/ROXICODONE) immediate release tablet 10 mg  10 mg Oral Q4H PRN Thurnell Lose, MD   10 mg at 10/27/13 0841  . pantoprazole (PROTONIX)  EC tablet 40 mg  40 mg Oral Daily Thurnell Lose, MD      . Derrill Memo ON 10/28/2013] pneumococcal 23 valent vaccine (PNU-IMMUNE) injection 0.5 mL  0.5 mL Intramuscular Tomorrow-1000 Thurnell Lose, MD      . zolpidem (AMBIEN) tablet 5 mg  5 mg Oral QHS PRN Jonetta Osgood, MD   5 mg at 10/27/13 0008    Allergies as of 10/25/2013 - Review Complete 10/25/2013  Allergen Reaction Noted  . Penicillins Hives 03/24/2009  . Doxycycline Other (See Comments)     Family History  Problem Relation Age of Onset  . Cancer Mother     breast  . Clotting disorder Mother   . Cancer Maternal Grandmother     Stomach Cancer  . Cancer Maternal Grandfather     Esophageal Cancer    History   Social History  . Marital Status: Single    Spouse Name: N/A    Number of Children: 1  . Years of Education: N/A   Occupational History  . social security disability at [employer] Topics  . Smoking status: Current Every Day Smoker -- 0.50 packs/day    Types: Cigarettes  . Smokeless tobacco: Never Used     Comment: 5 daily  . Alcohol Use: Yes     Comment: rare  . Drug Use: No  . Sexual Activity: No   Other Topics Concern  . Not  on file   Social History Narrative   Daily caffeine 1 cup   Patient does not get regular exercise   Lives with Fuller Mandril and baby son born 11/2010    Review of Systems: Ten point ROS is O/W negative except as mentioned in HPI.  Physical Exam: Vital signs in last 24 hours: Temp:  [97.6 F (36.4 C)-98.4 F (36.9 C)] 97.6 F (36.4 C) (03/22 0635) Pulse Rate:  [67-79] 75 (03/22 0635) Resp:  [16-20] 16 (03/22 0635) BP: (129-150)/(86-99) 147/99 mmHg (03/22 0635) SpO2:  [96 %-100 %] 100 % (03/22 0635) Last BM Date: 10/26/13  General:  Alert, Well-developed, well-nourished, pleasant and cooperative in NAD Head:  Normocephalic and atraumatic. Eyes:  Sclera clear, no icterus.  Conjunctiva pink. Ears:  Normal auditory acuity. Mouth:  No deformity or lesions.   Lungs:  Clear throughout to auscultation.  No wheezes, crackles, or rhonchi.  Heart:  Regular rate and rhythm; no murmurs, clicks, rubs,  or gallops. Abdomen:  Soft, Non-distended.  BS present.  Diffuse lower abdominal TTP without R/R/G.   Rectal:  Deferred  Msk:  Symmetrical without gross deformities. Pulses:  Normal pulses noted. Extremities:  Without clubbing or edema. Neurologic:  Alert and  oriented x4;  grossly normal neurologically. Skin:  Intact without significant lesions or rashes. Psych:  Alert and cooperative. Normal mood and affect.  Intake/Output from previous day: 03/21 0701 - 03/22 0700 In: 2175 [P.O.:800; I.V.:1375] Out: -   Lab Results:  Recent Labs  10/25/13 1704 10/26/13 0147  WBC 11.1* 13.6*  HGB 14.0 12.2  HCT 39.1 35.4*  PLT 586* 530*   BMET  Recent Labs  10/25/13 1704 10/26/13 0147 10/26/13 1130 10/26/13 2208 10/27/13 0500  NA 144 143  --   --  142  K 2.6* 3.0* 2.8* 3.4* 3.7  CL 104 108  --   --  108  CO2 23 20  --   --  22  GLUCOSE 75 106*  --   --  96  BUN 5* 5*  --   --  6  CREATININE 0.63 0.60  0.60  --   --  0.65  CALCIUM 7.7* 6.3*  --   --  7.4*   LFT  Recent Labs   10/25/13 1704  PROT 6.0  ALBUMIN 2.7*  AST 22  ALT 17  ALKPHOS 102  BILITOT 0.2*   IMPRESSION:  -Crohn's colitis and ileitis:  Seen on previous colonoscopies and recent CT scan. -Rectal prolapse:  Outpatient follow-up per surgery.  PLAN: -Continue IV steroids, however, solumedrol 40 mg daily should be sufficient and we will change the medication to that dose. -Continue IV cipro and flagyl for now. -Will need establish outpatient care promptly with another gastroenterology group. -Ok for clear liquids. -When she is discharged she will need to be placed on Entocort as she does not tolerate prednisone and refuses to take it.   ZEHR, JESSICA D.  10/27/2013, 9:52 AM  Pager number [phone]    Attending physician's note   I have taken a history, examined the patient and reviewed the chart. I agree with the Advanced Practitioner's note, impression and recommendations. History of Crohn's ileocolitis with recent flare and new rectal prolapse. She has not had ongoing GI follow up since being discharged from [place] [facility] in 11/2010.  Recommend Solumedrol IV 40 mg daily and clear liquids for now. OK with Cipro and Flagyl for now but this does not appear to be infectious so antibiotics can be stopped in 1-2 days. Entocort (or equivalent) 9 mg daily at discharge for Crohn's mgmt. Hospitalists please arrange follow up with another local gastroenterologist to promptly establish ongoing GI care upon discharge. OP follow up with Gen Surgery for rectal prolapse.  Ladene Artist, MD [facility]

## 2013-10-27 NOTE — Progress Notes (Signed)
Pt very emotional and complaining of on-going rectal and abdominal pain. On call hospitalist notified and percocet and xanax ordered. Provided some relief, but pt complains of pain each time she has a BM.Stool is brown and soft and blood was present.  Pt requesting percocet two tabs for her pain.

## 2013-10-27 NOTE — Progress Notes (Signed)
Patient c/o feeling weak. Patient having multiple BMs today x4. Patient asked to advanced her diet. Order given to advance diet to soft diet by MD.   Patient stated that "she ate a salad last night and believes that is why she had a set back today". Patient's mother stated that when the patient was told that she can have something "light", she thought it would be okay to eat the salad. Will continue to monitor

## 2013-10-28 ENCOUNTER — Encounter (HOSPITAL_COMMUNITY): Payer: Self-pay | Admitting: Student

## 2013-10-28 DIAGNOSIS — K623 Rectal prolapse: Secondary | ICD-10-CM

## 2013-10-28 LAB — CBC
HCT: 36.4 % (ref 36.0–46.0)
Hemoglobin: 12.4 g/dL (ref 12.0–15.0)
MCH: 31.4 pg (ref 26.0–34.0)
MCHC: 34.1 g/dL (ref 30.0–36.0)
MCV: 92.2 fL (ref 78.0–100.0)
Platelets: 614 10*3/uL — ABNORMAL HIGH (ref 150–400)
RBC: 3.95 MIL/uL (ref 3.87–5.11)
RDW: 16.2 % — ABNORMAL HIGH (ref 11.5–15.5)
WBC: 13.4 10*3/uL — ABNORMAL HIGH (ref 4.0–10.5)

## 2013-10-28 LAB — BASIC METABOLIC PANEL
BUN: 6 mg/dL (ref 6–23)
CO2: 23 mEq/L (ref 19–32)
Calcium: 7.9 mg/dL — ABNORMAL LOW (ref 8.4–10.5)
Chloride: 108 mEq/L (ref 96–112)
Creatinine, Ser: 0.53 mg/dL (ref 0.50–1.10)
GFR calc Af Amer: >90 mL/min (ref >90)
GFR calc non Af Amer: >90 mL/min (ref >90)
Glucose, Bld: 99 mg/dL (ref 70–99)
Potassium: 5.6 mEq/L — ABNORMAL HIGH (ref 3.7–5.3)
Sodium: 142 mEq/L (ref 137–147)

## 2013-10-28 LAB — MAGNESIUM: Magnesium: 1.6 mg/dL (ref 1.5–2.5)

## 2013-10-28 LAB — POTASSIUM: Potassium: 4.7 mEq/L (ref 3.7–5.3)

## 2013-10-28 MED ORDER — HYDROMORPHONE HCL PF 1 MG/ML IJ SOLN
2.0000 mg | Freq: Once | INTRAMUSCULAR | Status: AC
  Administered 2013-10-28: 2 mg via INTRAVENOUS
  Filled 2013-10-28: qty 2

## 2013-10-28 MED ORDER — ALPRAZOLAM 0.5 MG PO TABS
1.0000 mg | ORAL_TABLET | Freq: Three times a day (TID) | ORAL | Status: AC | PRN
  Administered 2013-10-28 (×3): 1 mg via ORAL
  Filled 2013-10-28 (×3): qty 2

## 2013-10-28 NOTE — Progress Notes (Signed)
Talked to Dr. Marcello Moores a few moments ago and she has further evaluated her medical chart.  She is now recommending surgical specialty follow up at [facility] given her Crohn's disease.  Dr. Marcello Moores doesn't feel she has anything surgical to offer her at this point.  I will cancel her appointment on Wednesday.

## 2013-10-28 NOTE — Progress Notes (Addendum)
PROGRESS NOTE  [name] GQQ:[id number] DOB: 1981/06/01 DOA: 10/25/2013 PCP: Odette Fraction, MD  Assessment/Plan: Rectal prolapse  - Secondary to chronic diarrhea.  - reduced on admission, recurred later this morning - surgery to see patient today again [facility] DISEASE flare abdominal pain and diarrhea. Recent CT scan as below  - In mild discomfort and crying this morning.  - GI following, continue IV steroids for now, d/c antibiotics.  - eating better, d/c IVF.  - She has been fired by local GI clinic, but wishes to continue care with [facility] Hypokalemia and hypomagnesemia  - Likely secondary to GI loss.  - K 5.6, d/c K in fluids, recheck this afternoon.   Diet: soft  Fluids: none DVT Prophylaxis: heparin  Code Status: Full Family Communication: d/w patient  Disposition Plan: home when ready   Consultants:  GI  Surgery  Procedures:  none   Antibiotics Ciprofloxacin 3/21 >> Metronidazole 3/21 >>  HPI/Subjective: - complains of abdominal pain, still about the same as on presentation  Objective: Filed Vitals:   10/27/13 1449 10/27/13 2343 10/28/13 0601 10/28/13 0602  BP: 135/96 143/94 160/100 160/100  Pulse:  87 74   Temp:  98.6 F (37 C) 97.9 F (36.6 C)   TempSrc:  Oral    Resp:  16 16   Height:      Weight:      SpO2:  97% 97%     Intake/Output Summary (Last 24 hours) at 10/28/13 1046 Last data filed at 10/28/13 0703  Gross per 24 hour  Intake   2245 ml  Output      0 ml  Net   2245 ml   Filed Weights   10/25/13 1655 10/26/13 0132  Weight: 52.164 kg (115 lb) 52.164 kg (115 lb)   Exam:  General:  NAD  Cardiovascular: regular rate and rhythm, without MRG  Respiratory: good air movement, clear to auscultation throughout, no wheezing, ronchi or rales  Abdomen: soft, mild diffuse tenderness, positive BS  MSK: no peripheral edema  Neuro: non focal  Data Reviewed: Basic Metabolic Panel:  Recent Labs Lab 10/25/13 1704  10/26/13 0147 10/26/13 1130 10/26/13 2208 10/27/13 0500 10/28/13 0540  NA 144 143  --   --  142 142  K 2.6* 3.0* 2.8* 3.4* 3.7 5.6*  CL 104 108  --   --  108 108  CO2 23 20  --   --  22 23  GLUCOSE 75 106*  --   --  96 99  BUN 5* 5*  --   --  6 6  CREATININE 0.63 0.60  0.60  --   --  0.65 0.53  CALCIUM 7.7* 6.3*  --   --  7.4* 7.9*  MG  --  0.6* 2.2  --  1.6 1.6   Liver Function Tests:  Recent Labs Lab 10/25/13 1704  AST 22  ALT 17  ALKPHOS 102  BILITOT 0.2*  PROT 6.0  ALBUMIN 2.7*    Recent Labs Lab 10/25/13 1704  LIPASE 13   CBC:  Recent Labs Lab 10/25/13 1704 10/26/13 0147 10/28/13 0540  WBC 11.1* 13.6* 13.4*  NEUTROABS 8.0*  --   --   HGB 14.0 12.2 12.4  HCT 39.1 35.4* 36.4  MCV 90.1 89.6 92.2  PLT 586* 530* 614*    Recent Results (from the past 240 hour(s))  CLOSTRIDIUM DIFFICILE BY PCR     Status: None   Collection Time    10/26/13  2:13 AM      Result Value Ref Range Status   C difficile by pcr NEGATIVE  NEGATIVE Final     Studies: Dg Abd 2 Views  10/27/2013   CLINICAL DATA:  Abdominal pain, history rectal prolapse, Crohn's disease, endometriosis, smoking, hypertension  EXAM: ABDOMEN - 2 VIEW  COMPARISON:  CT abdomen and pelvis 10/09/2013  FINDINGS: Normal bowel gas pattern.  No bowel dilatation, bowel wall thickening or free intraperitoneal air.  Bowel wall thickening seen at the ilium on the previous exam is not definitely visualized on current radiographs.  Bones unremarkable.  IMPRESSION: Normal bowel gas pattern.   Electronically Signed   By: Lavonia Dana M.D.   On: 10/27/2013 11:05    Scheduled Meds: . heparin  5,000 Units Subcutaneous 3 times per day  . methylPREDNISolone (SOLU-MEDROL) injection  40 mg Intravenous Daily  . pantoprazole  40 mg Oral Daily  . pneumococcal 23 valent vaccine  0.5 mL Intramuscular Tomorrow-1000   Continuous Infusions: . 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 Any transcriptional errors are unintentional.   Marzetta Board, MD [facility] Pager [phone]. If 7 PM - 7 AM, please contact night-coverage at [email or web address], password [facility] 10/28/2013, 10:46 AM  LOS: 3 days

## 2013-10-28 NOTE — Progress Notes (Signed)
Georganna Skeans, MD, MPH, FACS Trauma: [phone] General Surgery: [phone]

## 2013-10-28 NOTE — Progress Notes (Signed)
Daily Rounding Note  10/28/2013, 9:53 AM  LOS: 3 days   SUBJECTIVE:       On soft diet. Tolerating this.  Just had soft, semiformed stool with some blood.  Her rectal prolapse recurred with this and is very painful.   OBJECTIVE:         Vital signs in last 24 hours:    Temp:  [97.5 F (36.4 C)-98.6 F (37 C)] 97.9 F (36.6 C) (03/23 0601) Pulse Rate:  [74-87] 74 (03/23 0601) Resp:  [16-18] 16 (03/23 0601) BP: (135-160)/(94-100) 160/100 mmHg (03/23 0602) SpO2:  [97 %-98 %] 97 % (03/23 0601) Last BM Date: 10/28/13 General: tearful, uncomfortable.  Lying very still on her side.    Heart: rrrr Chest: clear bil Abdomen: soft, BS hypoactive.  Minor, non-focal tenderness. Rectum with dusky and bloody prolapse about the size of a lemon  Extremities: no CCE Neuro/Psych:  Tearful, oriented x 3.   Intake/Output from previous day: Nov 12, 2022 0701 - 03/23 0700 In: 2005 [P.O.:720; I.V.:785; IV Piggyback:500] Out: -   Intake/Output this shift: Total I/O In: 240 [P.O.:240] Out: -   Lab Results:  Recent Labs  10/25/13 1704 10/26/13 0147 10/28/13 0540  WBC 11.1* 13.6* 13.4*  HGB 14.0 12.2 12.4  HCT 39.1 35.4* 36.4  PLT 586* 530* 614*   BMET  Recent Labs  10/26/13 0147  10/26/13 2208 11/11/2013 0500 10/28/13 0540  NA 143  --   --  142 142  K 3.0*  < > 3.4* 3.7 5.6*  CL 108  --   --  108 108  CO2 20  --   --  22 23  GLUCOSE 106*  --   --  96 99  BUN 5*  --   --  6 6  CREATININE 0.60  0.60  --   --  0.65 0.53  CALCIUM 6.3*  --   --  7.4* 7.9*  < > = values in this interval not displayed. LFT  Recent Labs  10/25/13 1704  PROT 6.0  ALBUMIN 2.7*  AST 22  ALT 17  ALKPHOS 102  BILITOT 0.2*   Studies/Results: Dg Abd 2 Views  11-11-13   CLINICAL DATA:  Abdominal pain, history rectal prolapse, Crohn's disease, endometriosis, smoking, hypertension  EXAM: ABDOMEN - 2 VIEW  COMPARISON:  CT abdomen and pelvis  10/09/2013  FINDINGS: Normal bowel gas pattern.  No bowel dilatation, bowel wall thickening or free intraperitoneal air.  Bowel wall thickening seen at the ilium on the previous exam is not definitely visualized on current radiographs.  Bones unremarkable.  IMPRESSION: Normal bowel gas pattern.   Electronically Signed   By: Lavonia Dana M.D.   On: 11/11/2013 11:05   Scheduled Meds: . ciprofloxacin  400 mg Intravenous BID  . heparin  5,000 Units Subcutaneous 3 times per day  . methylPREDNISolone (SOLU-MEDROL) injection  40 mg Intravenous Daily  . metronidazole  500 mg Intravenous Q8H  . pantoprazole  40 mg Oral Daily  . pneumococcal 23 valent vaccine  0.5 mL Intramuscular Tomorrow-1000   Continuous Infusions: . 0.9 % NaCl with KCl 20 mEq / L 75 mL/hr at 10/28/13 0200   PRN Meds:.albuterol, ALPRAZolam, alum & mag hydroxide-simeth, guaiFENesin-dextromethorphan, HYDROmorphone (DILAUDID) injection, ondansetron (ZOFRAN) IV, oxyCODONE, zolpidem   ASSESMENT:   *  Flare of known Crohns with ileocolitis on CT scan. .  On IV Solumedrol, Cipro and Flagyl. S diff negative.  *  Rectal prolapse.  Reduced under sedation in ED on 3/20.  Dr Marcello Moores of surgery planning outpt followup.  It has recurred this AM and is causing pain.   Suspect it is the cause of rectal bleeding.  *  Chronic abdominal and back pain.  *  Hyperkalemia, following oral and IV replacement for hypokalemia.  *  Hypocalcemia. When corrected for low albumin, level is normal.    PLAN   *  Budesonide/entocort 9 mg daily at discharge. IV solumedrol for now *  I called surgery to resee pt today  *  Will discontinue to abx.      [name]  10/28/2013, 9:53 AM Pager: [phone]   GI Attending  I have also seen and assessed the patient and agree with the above note. She was under impression that she could re-enter [facility] but that is not our plan - she has been advised multiple times she will need to find another  gastroenterologist. Will relay to her again tomorrow after double checking on this. Rectal prolapse reduced again.  Gatha Mayer, MD, [facility] [phone] (pager) 10/28/2013 4:41 PM

## 2013-10-28 NOTE — Consult Note (Signed)
Rectal prolapse reduced manually after IV dilaudid. No significant bleeding. Case D/W Dr. Marcello Moores from our practice, a colorectal surgery specialist. She will see her in the office. In light of her history, she may need referral to [facility]. Plan D/W patient. Patient examined and I agree with the assessment and plan  Georganna Skeans, MD, MPH, FACS Trauma: [phone] General Surgery: [phone]  10/28/2013 1:35 PM

## 2013-10-28 NOTE — Discharge Instructions (Signed)
BRING YOUR INSURANCE PAPERWORK TO YOUR APPOINTMENT WITH DR. Marcello Moores ([place]) ON 10/30/13 AT 11:20AM, ARRIVE 30 MINUTES PRIOR TO YOUR APPOINTMENT TO FILL OUT PAPERWORK.  You should take Miralax and Colace to keep your stool soft to prevent further rectal prolapse. You should try to avoid pain medication other than tylenol and motrin, as this can cause constipation and cause recurrence of your rectal prolapse.

## 2013-10-28 NOTE — Consult Note (Signed)
LY WASS 25-Aug-1980  [id number].    Requesting MD: Wardell Heath Chief Complaint/Reason for Consult: Rectal Prolapse HPI:  [name] is a 33 y.o. female with a medical hx that includes Crohn's, recurrent rectal prolapse, and chronic abdominal pain. She presented to [facility] on 10/25/13 with complaints of recurrence after a similar episode from which she was discharged on 10/11/13 from the ED. Pt states the incident leading up to 10/11/13 was her first prolapse, and occurred after a formed BM at home.  She states she has had hemorrhoids before but this felt much larger prompting her visit to the ER. She reports intermittent blood in her stool since that first episode.  She denies fever or chills, and complains of generalized abdominal pain. She states her BMs range from watery to formed with her Crohns, but rarely have blood in them.  The prolapse has been reduced twice with sedation after granulated sugar did not work.  This morning just before 11:00am she re-prolapsed while having a BM.  She denies any abdominal surgeries apart from endometrial ablation and endometriosis surgery which she believes to have been laparoscopic.  She states she currently has no PCP or GI MD, and cites that as her reason for no follow up between these several incidents.  She had previously seen [facility] for her crohn's but was "fired from [employer]".  She has never seen a Psychologist, sport and exercise for this problem.  ROS: All systems reviewed and otherwise negative except for as above  Family History  Problem Relation Age of Onset  . Cancer Mother     breast  . Clotting disorder Mother   . Cancer Maternal Grandmother     Stomach Cancer  . Cancer Maternal Grandfather     Esophageal Cancer    Past Medical History  Diagnosis Date  . VITAMIN B1 DEFICIENCY 09/21/2009  . B12 DEFICIENCY 04/28/2009  . ANXIETY 03/24/2009  . SMOKER 12/02/2009  . ADHD 12/02/2009  . COMMON MIGRAINE 02/05/2010  . HYPERTENSION 03/24/2009  . GERD  03/24/2009  . [facility] INTESTINE 05/19/2009  . Cellulitis and abscess of leg, except foot 02/05/2010  . ECZEMA 05/20/2010  . 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 Cervicalgia 12/02/2009  . Unspecified backache 12/02/2009  . BURSITIS, RIGHT KNEE 02/05/2010  . HEADACHE, CHRONIC 03/24/2009  . Abdominal pain, unspecified site 03/24/2009  . OTITIS MEDIA, LEFT 08/12/2010  . Wheezing 08/12/2010  . Allergic rhinitis, cause unspecified 01/21/2011  . Chronic back pain 01/21/2011  . Chronic neck pain 08/04/2011  . Endometriosis 08/04/2011  . Osteoarthritis   . Fibromyalgia     Past Surgical History  Procedure Laterality Date  . Endometrial ablation  01/2009    Thinks laproscopic with possible transvaginal  . Breast enhancement surgery  2004    Social History:  reports that she has been smoking Cigarettes.  She has a 7 pack-year smoking history. She has never used smokeless tobacco. She reports that she drinks alcohol. She reports that she does not use illicit drugs.  Allergies:  Allergies  Allergen Reactions  . Penicillins Hives  . Doxycycline Other (See Comments)    unknown    Medications Prior to Admission  Medication Sig Dispense Refill  . cyclobenzaprine (FLEXERIL) 10 MG tablet Take 10 mg by mouth 3 (three) times daily as needed for muscle spasms.       . diphenhydrAMINE (SOMINEX) 25 MG tablet Take 25 mg by mouth at bedtime as needed for allergies or sleep.      Marland Kitchen  esomeprazole (NEXIUM) 40 MG capsule Take 40 mg by mouth 2 (two) times daily before a meal.      . fexofenadine (ALLEGRA) 180 MG tablet Take 180 mg by mouth daily as needed for allergies or rhinitis.      Marland Kitchen ibuprofen (ADVIL,MOTRIN) 200 MG tablet Take 800 mg by mouth daily as needed for moderate pain.      . montelukast (SINGULAIR) 10 MG tablet Take 10 mg by mouth daily as needed (for shortness of breath).      Marland Kitchen oxyCODONE-acetaminophen (PERCOCET/ROXICET) 5-325 MG per tablet Take 1 tablet by mouth every 4 (four) hours as needed for  severe pain.  15 tablet  0  . fexofenadine (ALLEGRA) 180 MG tablet Take 1 tablet (180 mg total) by mouth daily.  30 tablet  11    Blood pressure 160/100, pulse 74, temperature 97.9 F (36.6 C), temperature source Oral, resp. rate 16, height _0  (1.575 m), weight 115 lb (52.164 kg), last menstrual period 10/10/2011, SpO2 97.00%. Physical Exam: General: WD/WN white female who is laying in bed in left lateral position in discomfort HEENT: head is normocephalic, atraumatic.  Sclera are noninjected.  Ears and nose without any masses or lesions.  Mouth is pink and moist Heart: regular, rate, and rhythm.  No obvious murmurs, gallops, or rubs noted. Lungs: CTAB, no wheezes, rhonchi, or rales noted.  Respiratory effort nonlabored Abd: soft, diffusely tender but mild.  Non distended, Many BS. No abdominal scars noted.  No guarding or rebound. Ecchymosis to LLQ 2/2 heparin shots. Rectum:  Dark red prolapse with sanguinous discharge onto diaper. Approx the size of a tennis ball.       Results for orders placed during the hospital encounter of 10/25/13 (from the past 48 hour(s))  POTASSIUM     Status: Abnormal   Collection Time    10/26/13 10:08 PM      Result Value Ref Range   Potassium 3.4 (*) 3.7 - 5.3 mEq/L   Comment: DELTA CHECK NOTED  MAGNESIUM     Status: None   Collection Time    10/27/13  5:00 AM      Result Value Ref Range   Magnesium 1.6  1.5 - 2.5 mg/dL  BASIC METABOLIC PANEL     Status: Abnormal   Collection Time    10/27/13  5:00 AM      Result Value Ref Range   Sodium 142  137 - 147 mEq/L   Potassium 3.7  3.7 - 5.3 mEq/L   Chloride 108  96 - 112 mEq/L   CO2 22  19 - 32 mEq/L   Glucose, Bld 96  70 - 99 mg/dL   BUN 6  6 - 23 mg/dL   Creatinine, Ser 0.65  0.50 - 1.10 mg/dL   Calcium 7.4 (*) 8.4 - 10.5 mg/dL   GFR calc non Af Amer >90  >90 mL/min   GFR calc Af Amer >90  >90 mL/min   Comment: (NOTE)     The eGFR has been calculated using the CKD EPI equation.     This  calculation has not been validated in all clinical situations.     eGFR's persistently <90 mL/min signify possible Chronic Kidney     Disease.  MAGNESIUM     Status: None   Collection Time    10/28/13  5:40 AM      Result Value Ref Range   Magnesium 1.6  1.5 - 2.5 mg/dL  BASIC METABOLIC PANEL  Status: Abnormal   Collection Time    10/28/13  5:40 AM      Result Value Ref Range   Sodium 142  137 - 147 mEq/L   Potassium 5.6 (*) 3.7 - 5.3 mEq/L   Comment: HEMOLYSIS AT THIS LEVEL MAY AFFECT RESULT     DELTA CHECK NOTED   Chloride 108  96 - 112 mEq/L   CO2 23  19 - 32 mEq/L   Glucose, Bld 99  70 - 99 mg/dL   BUN 6  6 - 23 mg/dL   Creatinine, Ser 0.53  0.50 - 1.10 mg/dL   Calcium 7.9 (*) 8.4 - 10.5 mg/dL   GFR calc non Af Amer >90  >90 mL/min   GFR calc Af Amer >90  >90 mL/min   Comment: (NOTE)     The eGFR has been calculated using the CKD EPI equation.     This calculation has not been validated in all clinical situations.     eGFR's persistently <90 mL/min signify possible Chronic Kidney     Disease.  CBC     Status: Abnormal   Collection Time    10/28/13  5:40 AM      Result Value Ref Range   WBC 13.4 (*) 4.0 - 10.5 K/uL   RBC 3.95  3.87 - 5.11 MIL/uL   Hemoglobin 12.4  12.0 - 15.0 g/dL   HCT 36.4  36.0 - 46.0 %   MCV 92.2  78.0 - 100.0 fL   MCH 31.4  26.0 - 34.0 pg   MCHC 34.1  30.0 - 36.0 g/dL   RDW 16.2 (*) 11.5 - 15.5 %   Platelets 614 (*) 150 - 400 K/uL   Dg Abd 2 Views  10/27/2013   CLINICAL DATA:  Abdominal pain, history rectal prolapse, Crohn's disease, endometriosis, smoking, hypertension  EXAM: ABDOMEN - 2 VIEW  COMPARISON:  CT abdomen and pelvis 10/09/2013  FINDINGS: Normal bowel gas pattern.  No bowel dilatation, bowel wall thickening or free intraperitoneal air.  Bowel wall thickening seen at the ilium on the previous exam is not definitely visualized on current radiographs.  Bones unremarkable.  IMPRESSION: Normal bowel gas pattern.   Electronically Signed    By: Lavonia Dana M.D.   On: 10/27/2013 11:05     Assessment/Plan Rectal prolapse Crohns disease acute flare:  Managed by GI Numerous comorbidities: Managed by medicine  1.  No urgent surgical need. Recommend outpatient follow up 2. D/w Dr. Grandville Silos ref possible reduction under sedation today 3. NPO until timeline for reduction is established 4. Urged pt to follow up with PCP/GI once d/c, for better control of Crohns prior to any surgery 5. Discussed with Dr. Marcello Moores who will agree to see in the office, but will likely refer to [facility] for further evaluation and management. 6.  Heparin for VTE prophylaxis.  Barrington Ellison, PA-S [facility] Surgery 10/28/2013, 11:43 AM Pager: [phone]

## 2013-10-29 ENCOUNTER — Encounter (HOSPITAL_COMMUNITY): Payer: Self-pay | Admitting: Physician Assistant

## 2013-10-29 ENCOUNTER — Inpatient Hospital Stay (HOSPITAL_COMMUNITY): Payer: Medicaid Other

## 2013-10-29 DIAGNOSIS — F411 Generalized anxiety disorder: Secondary | ICD-10-CM

## 2013-10-29 LAB — CBC
HCT: 35.3 % — ABNORMAL LOW (ref 36.0–46.0)
Hemoglobin: 11.7 g/dL — ABNORMAL LOW (ref 12.0–15.0)
MCH: 30.7 pg (ref 26.0–34.0)
MCHC: 33.1 g/dL (ref 30.0–36.0)
MCV: 92.7 fL (ref 78.0–100.0)
Platelets: 523 10*3/uL — ABNORMAL HIGH (ref 150–400)
RBC: 3.81 MIL/uL — ABNORMAL LOW (ref 3.87–5.11)
RDW: 16.1 % — ABNORMAL HIGH (ref 11.5–15.5)
WBC: 13.1 10*3/uL — ABNORMAL HIGH (ref 4.0–10.5)

## 2013-10-29 LAB — BASIC METABOLIC PANEL
BUN: 12 mg/dL (ref 6–23)
CO2: 25 mEq/L (ref 19–32)
Calcium: 8.6 mg/dL (ref 8.4–10.5)
Chloride: 103 mEq/L (ref 96–112)
Creatinine, Ser: 0.56 mg/dL (ref 0.50–1.10)
GFR calc Af Amer: >90 mL/min (ref >90)
GFR calc non Af Amer: >90 mL/min (ref >90)
Glucose, Bld: 104 mg/dL — ABNORMAL HIGH (ref 70–99)
Potassium: 3.3 mEq/L — ABNORMAL LOW (ref 3.7–5.3)
Sodium: 141 mEq/L (ref 137–147)

## 2013-10-29 LAB — MAGNESIUM: Magnesium: 1.3 mg/dL — ABNORMAL LOW (ref 1.5–2.5)

## 2013-10-29 IMAGING — CR DG CHEST 1V PORT
1 series · 1 of 1 positions shown · non-contrast
Comparison: Chest x-ray of [DATE].

CLINICAL DATA: Chronic left-sided chest discomfort

EXAM:
PORTABLE CHEST - 1 VIEW

[AP]
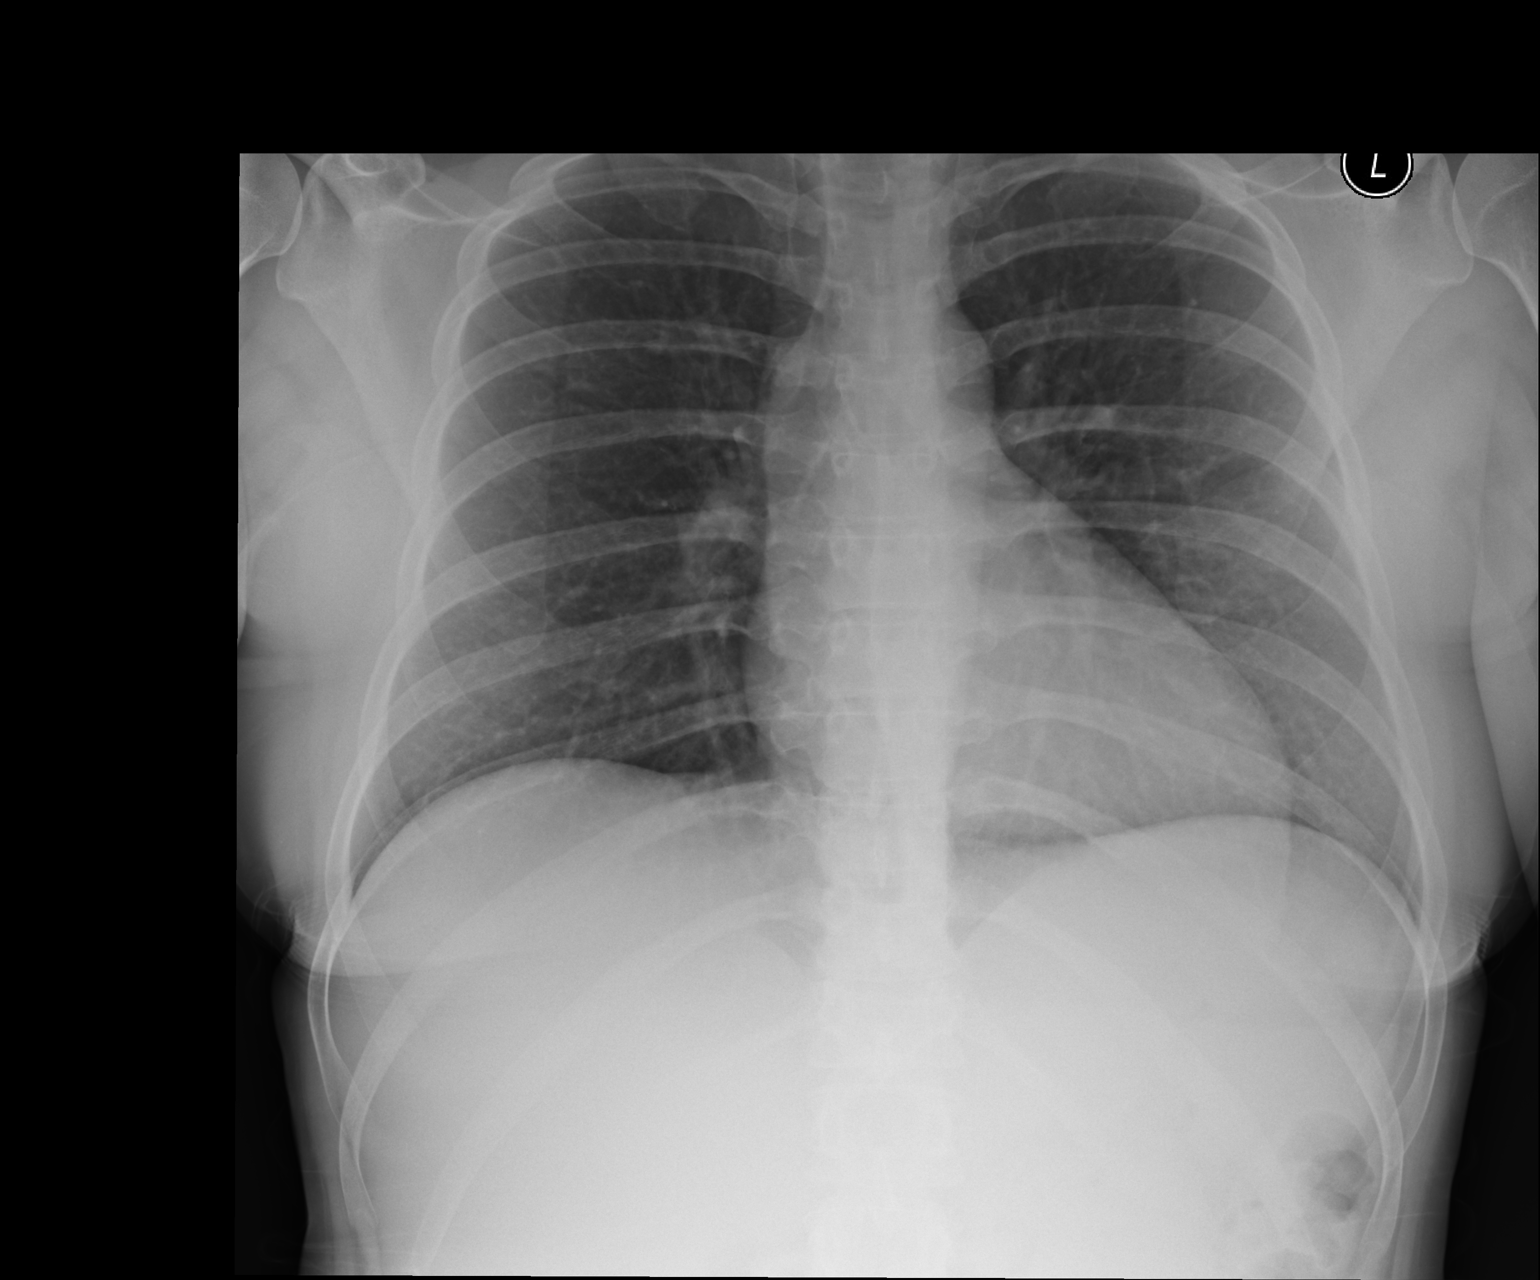

[1 of 1 positions shown; findings below may reference images not displayed]

FINDINGS: The lungs are adequately inflated and clear. The cardiopericardial
silhouette is normal in size. The pulmonary vascularity is not
engorged. The mediastinum is normal in width. There is no pleural
effusion. The trachea is midline. The observed portions of the bony
thorax appear normal.
IMPRESSION: There is no evidence of active cardiopulmonary disease.

## 2013-10-29 MED ORDER — ALPRAZOLAM 0.5 MG PO TABS
1.0000 mg | ORAL_TABLET | Freq: Three times a day (TID) | ORAL | Status: DC | PRN
  Administered 2013-10-29 – 2013-10-30 (×3): 1 mg via ORAL
  Filled 2013-10-29 (×3): qty 2

## 2013-10-29 MED ORDER — MORPHINE SULFATE 2 MG/ML IJ SOLN
2.0000 mg | INTRAMUSCULAR | Status: DC | PRN
  Administered 2013-10-29 – 2013-10-30 (×5): 2 mg via INTRAVENOUS
  Filled 2013-10-29 (×5): qty 1

## 2013-10-29 MED ORDER — HYDROMORPHONE HCL PF 1 MG/ML IJ SOLN
1.0000 mg | Freq: Once | INTRAMUSCULAR | Status: AC
  Administered 2013-10-29: 1 mg via INTRAVENOUS
  Filled 2013-10-29: qty 1

## 2013-10-29 MED ORDER — BUDESONIDE 3 MG PO CP24
9.0000 mg | ORAL_CAPSULE | Freq: Every day | ORAL | Status: DC
  Administered 2013-10-29 – 2013-10-30 (×2): 9 mg via ORAL
  Filled 2013-10-29 (×2): qty 3

## 2013-10-29 MED ORDER — HYDROMORPHONE HCL PF 1 MG/ML IJ SOLN
0.5000 mg | Freq: Once | INTRAMUSCULAR | Status: AC
  Administered 2013-10-30: 0.5 mg via INTRAVENOUS
  Filled 2013-10-29: qty 1

## 2013-10-29 MED ORDER — MAGNESIUM SULFATE IN D5W 10-5 MG/ML-% IV SOLN
1.0000 g | Freq: Once | INTRAVENOUS | Status: AC
  Administered 2013-10-29: 1 g via INTRAVENOUS
  Filled 2013-10-29: qty 100

## 2013-10-29 MED ORDER — CYCLOBENZAPRINE HCL 10 MG PO TABS
5.0000 mg | ORAL_TABLET | Freq: Three times a day (TID) | ORAL | Status: DC | PRN
  Administered 2013-10-29 – 2013-10-30 (×3): 5 mg via ORAL
  Filled 2013-10-29 (×3): qty 1

## 2013-10-29 MED ORDER — HYDRALAZINE HCL 20 MG/ML IJ SOLN
5.0000 mg | INTRAMUSCULAR | Status: DC | PRN
  Administered 2013-10-29 – 2013-10-30 (×4): 5 mg via INTRAVENOUS
  Filled 2013-10-29 (×4): qty 1

## 2013-10-29 NOTE — Progress Notes (Signed)
          Daily Rounding Note  10/29/2013, 9:38 AM  LOS: 4 days   SUBJECTIVE:       Multifocal pain today. In back, neck, abdomen, rectum.   Says she fell at home PTA and since then her neck has hurt, radiating pain into thoracic spine where she has had loss of height at T12 per 03/2012 x ray. Some numbness in fingers, legs.   Stools are loose, some blood, about 3 or 4 today.  Tolerating solids, little nausea. Abdominal pain not severe.   OBJECTIVE:         Vital signs in last 24 hours:    Temp:  [98.3 F (36.8 C)-98.7 F (37.1 C)] 98.6 F (37 C) (03/24 0538) Pulse Rate:  [79-91] 88 (03/24 0538) Resp:  [16-18] 18 (03/24 0538) BP: (138-177)/(78-113) 138/78 mmHg (03/24 0538) SpO2:  [98 %-99 %] 99 % (03/24 0538) Last BM Date: 10/28/13 General: looks well, comfortable   Heart: RRR Chest: clear bil. Abdomen: soft, non-focal tenderness is mild.  Active BS.  Not distended  Extremities: no CCE Neuro/Psych:  Moves all 4s.   Intake/Output from previous day: 03/23 0701 - 03/24 0700 In: 720 [P.O.:720] Out: -   Intake/Output this shift:    Lab Results:  Recent Labs  10/28/13 0540 10/29/13 0632  WBC 13.4* 13.1*  HGB 12.4 11.7*  HCT 36.4 35.3*  PLT 614* 523*   BMET  Recent Labs  10/27/13 0500 10/28/13 0540 10/28/13 1630 10/29/13 0632  NA 142 142  --  141  K 3.7 5.6* 4.7 3.3*  CL 108 108  --  103  CO2 22 23  --  25  GLUCOSE 96 99  --  104*  BUN 6 6  --  12  CREATININE 0.65 0.53  --  0.56  CALCIUM 7.4* 7.9*  --  8.6     ASSESMENT:   * Flare of known Crohns with ileocolitis on CT scan 10/10/12. .  On IV Solumedrol,C diff negative, abx discontinued on 3/23  * Rectal prolapse. Reduced under sedation in ED on 3/20 and again on 3/23. Dr Marcello Moores of surgery now says pt should seek surgical care at [facility], given her Crohns disease.   Suspect prolapse is the major cause of rectal bleeding and not the Crohn's.  * Chronic  abdominal and back pain. Now with neck pain radiating down spine.   * Hyperkalemia, following oral and IV replacement for hypokalemia.  * Hypocalcemia. When corrected for low albumin, level is normal.      PLAN   *  Wonder if surgeons will arrange for referral to their peers at [facility]?  Pt ought to be provided at least with a contact # there if not a formal referral.  *  Changed her to budesonide, stopped the Solumedrol. .  *  Work up of spine pain per hospitalist.  *  Will sign off.  Pt aware that she needs to find a non [facility] MD for future GI needs.      Azucena Freed  10/29/2013, 9:38 AM Pager: [phone]  I agree w/ Ms. Glyn Ade - Crohn's not primary issue at this time. She was non-compliant with f/u and was discharged from our practice (outpatient).  Gatha Mayer, MD, [facility] [phone] (pager) 10/29/2013 5:58 PM

## 2013-10-29 NOTE — Progress Notes (Signed)
PROGRESS NOTE  [name] [id number] DOB: 1981-01-02 DOA: 10/25/2013 PCP: Odette Fraction, MD  Assessment/Plan: Rectal prolapse  - Secondary to chronic diarrhea.  - reduced x2 on this admission -Surgery not recommending further interventions at this point. Recommending referral to [facility] DISEASE flare abdominal pain and diarrhea. Recent CT scan as below  - Improving  - GI following, IV solu-medrol stopped, started budesomide  - eating better, d/c IVF.  - She has been fired by [facility] -Will need follow up with non-[facility] Hypokalemia and hypomagnesemia  - Likely secondary to GI loss.  - K 5.6, d/c K in fluids, recheck this afternoon.   Diet: soft  Fluids: none DVT Prophylaxis: heparin  Code Status: Full Family Communication: d/w patient  Disposition Plan: home when ready   Consultants:  GI  Surgery  Procedures:  none   Antibiotics Ciprofloxacin 3/21 >> Metronidazole 3/21 >>  HPI/Subjective: - Patient complaining of ongoing generalized abdominal pain, however complaints seem to outweigh exam findings. She had been sleeping prior to my arrival. Did not seem to be in acute distress during my encounter.   Objective: Filed Vitals:   10/29/13 0242 10/29/13 0244 10/29/13 0538 10/29/13 1500  BP: 159/103 150/97 138/78 163/106  Pulse:   88 84  Temp:   98.6 F (37 C) 98.2 F (36.8 C)  TempSrc:   Oral   Resp:   18 20  Height:      Weight:      SpO2:   99% 99%   No intake or output data in the 24 hours ending 10/29/13 1720 Filed Weights   10/25/13 1655 10/26/13 0132  Weight: 52.164 kg (115 lb) 52.164 kg (115 lb)   Exam:  General:  NAD  Cardiovascular: regular rate and rhythm, without MRG  Respiratory: good air movement, clear to auscultation throughout, no wheezing, ronchi or rales  Abdomen: soft, mild diffuse tenderness, positive BS  MSK: no peripheral edema  Neuro: non focal  Data Reviewed: Basic Metabolic Panel:  Recent  Labs Lab 10/25/13 1704 10/26/13 0147 10/26/13 1130 10/26/13 2208 10/27/13 0500 10/28/13 0540 10/28/13 1630 10/29/13 0632  NA 144 143  --   --  142 142  --  141  K 2.6* 3.0* 2.8* 3.4* 3.7 5.6* 4.7 3.3*  CL 104 108  --   --  108 108  --  103  CO2 23 20  --   --  22 23  --  25  GLUCOSE 75 106*  --   --  96 99  --  104*  BUN 5* 5*  --   --  6 6  --  12  CREATININE 0.63 0.60  0.60  --   --  0.65 0.53  --  0.56  CALCIUM 7.7* 6.3*  --   --  7.4* 7.9*  --  8.6  MG  --  0.6* 2.2  --  1.6 1.6  --  1.3*   Liver Function Tests:  Recent Labs Lab 10/25/13 1704  AST 22  ALT 17  ALKPHOS 102  BILITOT 0.2*  PROT 6.0  ALBUMIN 2.7*    Recent Labs Lab 10/25/13 1704  LIPASE 13   CBC:  Recent Labs Lab 10/25/13 1704 10/26/13 0147 10/28/13 0540 10/29/13 0632  WBC 11.1* 13.6* 13.4* 13.1*  NEUTROABS 8.0*  --   --   --   HGB 14.0 12.2 12.4 11.7*  HCT 39.1 35.4* 36.4 35.3*  MCV 90.1 89.6 92.2 92.7  PLT 586*  530* 614* 523*    Recent Results (from the past 240 hour(s))  CLOSTRIDIUM DIFFICILE BY PCR     Status: None   Collection Time    10/26/13  2:13 AM      Result Value Ref Range Status   C difficile by pcr NEGATIVE  NEGATIVE Final     Studies: No results found.  Scheduled Meds: . budesonide  9 mg Oral Daily  . heparin  5,000 Units Subcutaneous 3 times per day  . pantoprazole  40 mg Oral Daily  . 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 Any transcriptional errors are unintentional.   Marzetta Board, MD [facility] Pager [phone]. If 7 PM - 7 AM, please contact night-coverage at [email or web address], password  [facility] 10/29/2013, 5:20 PM  LOS: 4 days

## 2013-10-29 NOTE — Progress Notes (Signed)
Pt stated she was experiencing pain on a level of 10 of 10 on the pain scale in her back, neck, rectum and belly. Vital signs were BP 177/113, HR 76, RR 19 at 0130. Pt states that she has been experiencing hypertension for a while. Called provider on call about pt request for dilaudid and pts hypertension. Provider ordered dilaudid one time dose and hydralazine q4h prn. Pt BP at 0245 was 150/97 and at 0540 was 138/78. Pt states that she feels more relaxed. Will continue to monitor.

## 2013-10-30 ENCOUNTER — Ambulatory Visit (INDEPENDENT_AMBULATORY_CARE_PROVIDER_SITE_OTHER): Payer: Self-pay | Admitting: General Surgery

## 2013-10-30 DIAGNOSIS — K219 Gastro-esophageal reflux disease without esophagitis: Secondary | ICD-10-CM

## 2013-10-30 LAB — BASIC METABOLIC PANEL
BUN: 11 mg/dL (ref 6–23)
CO2: 27 mEq/L (ref 19–32)
Calcium: 8.8 mg/dL (ref 8.4–10.5)
Chloride: 98 mEq/L (ref 96–112)
Creatinine, Ser: 0.59 mg/dL (ref 0.50–1.10)
GFR calc Af Amer: >90 mL/min (ref >90)
GFR calc non Af Amer: >90 mL/min (ref >90)
Glucose, Bld: 135 mg/dL — ABNORMAL HIGH (ref 70–99)
Potassium: 3.6 mEq/L — ABNORMAL LOW (ref 3.7–5.3)
Sodium: 140 mEq/L (ref 137–147)

## 2013-10-30 LAB — CBC
HCT: 38.7 % (ref 36.0–46.0)
Hemoglobin: 13.1 g/dL (ref 12.0–15.0)
MCH: 31 pg (ref 26.0–34.0)
MCHC: 33.9 g/dL (ref 30.0–36.0)
MCV: 91.7 fL (ref 78.0–100.0)
Platelets: 550 10*3/uL — ABNORMAL HIGH (ref 150–400)
RBC: 4.22 MIL/uL (ref 3.87–5.11)
RDW: 16 % — ABNORMAL HIGH (ref 11.5–15.5)
WBC: 15.7 10*3/uL — ABNORMAL HIGH (ref 4.0–10.5)

## 2013-10-30 LAB — TROPONIN I: Troponin I: <0.3 ng/mL (ref <0.30)

## 2013-10-30 LAB — MAGNESIUM: Magnesium: 1.5 mg/dL (ref 1.5–2.5)

## 2013-10-30 MED ORDER — MORPHINE SULFATE 2 MG/ML IJ SOLN
1.0000 mg | INTRAMUSCULAR | Status: DC | PRN
  Administered 2013-10-30 (×2): 1 mg via INTRAVENOUS
  Filled 2013-10-30 (×2): qty 1

## 2013-10-30 MED ORDER — BUDESONIDE 3 MG PO CP24: 9.0000 mg | ORAL_CAPSULE | Freq: Every day | ORAL | Status: DC

## 2013-10-30 MED ORDER — ALPRAZOLAM 1 MG PO TABS: 1.0000 mg | ORAL_TABLET | Freq: Three times a day (TID) | ORAL | Status: DC | PRN

## 2013-10-30 MED ORDER — POTASSIUM CHLORIDE 20 MEQ/15ML (10%) PO SOLN: 20.0000 meq | Freq: Two times a day (BID) | ORAL | Status: DC

## 2013-10-30 MED ORDER — OXYCODONE HCL 10 MG PO TABS
10.0000 mg | ORAL_TABLET | ORAL | Status: DC | PRN
Start: 2013-10-30 — End: 2013-11-05

## 2013-10-30 NOTE — Discharge Summary (Signed)
Physician Discharge Summary  [name] HWE:[id number] DOB: [birth year] DOA: 10/25/2013  PCP: Odette Fraction, MD  Admit date: 10/25/2013 Discharge date: 10/30/2013  Time spent: 35 minutes  Recommendations for Outpatient Follow-up:  1. Patient set up to followup with Dr Harlon Ditty, [facility] Department of Surgery, appointment set for 11/08/2013 at 10 AM.  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 female with a Past Medical History of Crohn's disease, chronic back pain who presents to the ED today with the above noted complaint. Per patient, she was discharged on 3/6 after being admitted for almost a similar issue. Since discharge she has not seen her primary care practitioner or a gastroenterologist. She presented today to the emergency room because of pain in the anal region, and it again felt like a rectum had prolapsed yesterday. She also gives a history of having profuse loose watery stools. She claims she is almost incontinent. She complains that all of these symptoms ongoing since last discharge. She also claims that she continues to have diffuse abdominal pain. She denies any vomiting. Denies any fever.  She was evaluated in the emergency room, and found to have a rectal prolapse, the emergency room physician sedated the patient with etomidate and successfully reduced the rectal prolapse. Even after reduction of the rectal prolapse, patient continued to have abdominal pain, as is I was asked to admit this patient for further evaluation and treatment.  Patient denies any fever, headache, chest  pain, shortness of breath. There is no history of nausea or vomiting.  Hospital Course:  Patient is a pleasant 33 year old female with a history of Crohn's disease, medication nonadherence, history of recurrent rectal prolapse, was admitted to the medicine service on 10/26/2013 presenting with pain at the anal region, having rectal prolapse. Patient also complained of diffuse abdominal discomfort. Last CT scan of abdomen and pelvis was performed on 10/09/2013 which showed extensive mucosal thickening within the terminal ileum without evidence for obstruction. Compatible with terminal ileitis and consistent with Crohn's disease. During this hospitalization she was seen and evaluated by Dr. Grandville Silos of colorectal surgery, as she underwent manual reduction twice during this hospitalization. With regard to history of Crohn's disease GI was consulted as she was initially started on IV Solu-Medrol, transition budesonide by mouth daily on 10/29/2013. General surgery recommending patient followup with colorectal surgery specialist at [facility], recommending Dr. Harlon Ditty. Prior to discharge she was set up with an appointment with Dr. Launa Flight scheduled for 11/08/2013 at 10 AM. With regard to GI followup, [facility] practice requesting that she followup with another gastroenterologist. I spoke with [facility] who requested that patient call office directly. This information was given to patient prior to discharge. She was discharged to her home in stable condition on 10/30/2013.  Procedures:  Manual reduction of rectal prolapse  Consultations:  Gastroenterology  Surgery  Discharge Exam: Filed Vitals:   10/30/13 0742  BP: 145/87  Pulse: 85  Temp:   Resp:     General: She is in no acute distress, sleeping prior to my entering the room. Cardiovascular: Regular rate and rhythm normal  S1-S2 Respiratory: Clear to auscultation bilaterally Abdomen: Mild generalized tenderness to palpation across  abdomen  Discharge Instructions  Discharge Orders   Future Orders Complete By Expires   Call MD for:  difficulty breathing, headache or visual disturbances  As directed    Call MD for:  extreme fatigue  As directed    Call MD for:  persistant dizziness or light-headedness  As directed    Call MD for:  persistant nausea and vomiting  As directed    Call MD for:  severe uncontrolled pain  As directed    Call MD for:  temperature >100.4  As directed    Diet - low sodium heart healthy  As directed    Discharge instructions  As directed    Comments:     1) Please follow up with Dr Harlon Ditty of Colorectal Surgery at the surgery clinic at [facility] located on the first floor of the main hospital, on November 08, 2013 at 10 am, tel: [phone]  2) Call [facility] at [phone] to set up follow up appointment   Increase activity slowly  As directed        Medication List    STOP taking these medications       ibuprofen 200 MG tablet  Commonly known as:  ADVIL,MOTRIN     oxyCODONE-acetaminophen 5-325 MG per tablet  Commonly known as:  PERCOCET/ROXICET      TAKE these medications       ALPRAZolam 1 MG tablet  Commonly known as:  XANAX  Take 1 tablet (1 mg total) by mouth 3 (three) times daily as needed for anxiety.     budesonide 3 MG 24 hr capsule  Commonly known as:  ENTOCORT EC  Take 3 capsules (9 mg total) by mouth daily.     cyclobenzaprine 10 MG tablet  Commonly known as:  FLEXERIL  Take 10 mg by mouth 3 (three) times daily as needed for muscle spasms.     diphenhydrAMINE 25 MG tablet  Commonly known as:  SOMINEX  Take 25 mg by mouth at bedtime as needed for allergies or sleep.     esomeprazole 40 MG capsule  Commonly known as:  NEXIUM  Take 40 mg by mouth 2 (two) times daily before a meal.     fexofenadine 180 MG tablet  Commonly known as:  ALLEGRA  Take 180 mg by mouth daily as needed for allergies or rhinitis.     fexofenadine 180 MG tablet  Commonly  known as:  ALLEGRA  Take 1 tablet (180 mg total) by mouth daily.     montelukast 10 MG tablet  Commonly known as:  SINGULAIR  Take 10 mg by mouth daily as needed (for shortness of breath).     Oxycodone HCl 10 MG Tabs  Take 1 tablet (10 mg total) by mouth every 4 (four) hours as needed for severe pain.     polyethylene glycol packet  Commonly known as:  MIRALAX / GLYCOLAX  Take 34 g by mouth daily.     potassium chloride 20 MEQ/15ML (10%) Soln  Take 15 mLs (20 mEq total) by mouth 2 (two) times daily.     senna-docusate 8.6-50 MG per tablet  Commonly known as:  Senokot-S  Take 1 tablet by mouth 2 (two) times daily.       Allergies  Allergen Reactions  . Penicillins Hives  . Doxycycline Other (See Comments)    unknown       Follow-up  Information   Follow up with Odette Fraction, MD.   Specialty:  Family Medicine   Contact information:   [address] [place] [address] [phone]       Please follow up. (YOU WILL NEED TO GET A REFERRAL FROM YOUR PCP TO A GENERAL SURGEON AT [facility] FOR FURTHER EVALUATION/MANAGEMENT/TREATMENT OF YOUR RECTAL PROLAPSE)       Follow up with Harlon Ditty, MD On 11/08/2013.   Specialty:  Surgery   Contact information:   [id number] [place] [address] [phone]        The results of significant diagnostics from this hospitalization (including imaging, microbiology, ancillary and laboratory) are listed below for reference.    Significant Diagnostic Studies: Ct Abdomen Pelvis W Contrast  10/09/2013   CLINICAL DATA:  The progressive rectal bleeding. History of Crohn's disease and ulcerative colitis.  EXAM: CT ABDOMEN AND PELVIS WITH CONTRAST  TECHNIQUE: Multidetector CT imaging of the abdomen and pelvis was performed using the standard protocol following bolus administration of intravenous contrast.  CONTRAST:  191mL OMNIPAQUE IOHEXOL 300 MG/ML  SOLN  COMPARISON:  CT abdomen and  pelvis 10/22/2010.  FINDINGS: Bilateral breast implants are present. The heart size is normal. The lungs are clear.  The liver and spleen are within normal limits. The stomach, duodenum, and pancreas are within normal limits. The common bile duct is dilated, measuring 8.5 mm at pancreatic head. No obstructing lesion is present. The gallbladder is within normal limits. The adrenal glands and kidneys are normal bilaterally. The ureters are unremarkable.  Marked mucosal thickening and narrowing is present within the terminal ileum. The anastomosis is intact. There is no obstruction. Mild edematous changes are present at the rectum. The sigmoid colon is within normal limits. The descending, transverse, and distal ascending colon is normal. There is some inflammatory change about the cecum. The appendix is visualized and normal. The more proximal small bowel is unremarkable.  The bone windows are unremarkable.  IMPRESSION: 1. Extensive mucosal thickening within the terminal ileum without evidence for obstruction. This is compatible with terminal ileitis and consistent with the given diagnosis of Crohn's disease. 2. Mild inflammatory changes about the cecum. The remainder of the colon is unremarkable. 3. The inflammatory changes within the rectum. 4. Chronic dilation of the common bile duct without evidence for an obstructing lesion.   Electronically Signed   By: Lawrence Santiago M.D.   On: 10/09/2013 21:21   Dg Chest Port 1 View  10/30/2013   CLINICAL DATA:  Chronic left-sided chest discomfort  EXAM: PORTABLE CHEST - 1 VIEW  COMPARISON:  Chest x-ray of February 09, 2010.  FINDINGS: The lungs are adequately inflated and clear. The cardiopericardial silhouette is normal in size. The pulmonary vascularity is not engorged. The mediastinum is normal in width. There is no pleural effusion. The trachea is midline. The observed portions of the bony thorax appear normal.  IMPRESSION: There is no evidence of active cardiopulmonary  disease.   Electronically Signed   By: David  Martinique   On: 10/30/2013 08:12   Dg Abd 2 Views  10/27/2013   CLINICAL DATA:  Abdominal pain, history rectal prolapse, Crohn's disease, endometriosis, smoking, hypertension  EXAM: ABDOMEN - 2 VIEW  COMPARISON:  CT abdomen and pelvis 10/09/2013  FINDINGS: Normal bowel gas pattern.  No bowel dilatation, bowel wall thickening or free intraperitoneal air.  Bowel wall thickening seen at the ilium on the previous exam is not definitely visualized on  current radiographs.  Bones unremarkable.  IMPRESSION: Normal bowel gas pattern.   Electronically Signed   By: Lavonia Dana M.D.   On: 10/27/2013 11:05    Microbiology: Recent Results (from the past 240 hour(s))  CLOSTRIDIUM DIFFICILE BY PCR     Status: None   Collection Time    10/26/13  2:13 AM      Result Value Ref Range Status   C difficile by pcr NEGATIVE  NEGATIVE Final     Labs: Basic Metabolic Panel:  Recent Labs Lab 10/26/13 0147 10/26/13 1130  10/27/13 0500 10/28/13 0540 10/28/13 1630 10/29/13 0632 10/30/13 0023  NA 143  --   --  142 142  --  141 140  K 3.0* 2.8*  < > 3.7 5.6* 4.7 3.3* 3.6*  CL 108  --   --  108 108  --  103 98  CO2 20  --   --  22 23  --  25 27  GLUCOSE 106*  --   --  96 99  --  104* 135*  BUN 5*  --   --  6 6  --  12 11  CREATININE 0.60  0.60  --   --  0.65 0.53  --  0.56 0.59  CALCIUM 6.3*  --   --  7.4* 7.9*  --  8.6 8.8  MG 0.6* 2.2  --  1.6 1.6  --  1.3* 1.5  < > = values in this interval not displayed. Liver Function Tests:  Recent Labs Lab 10/25/13 1704  AST 22  ALT 17  ALKPHOS 102  BILITOT 0.2*  PROT 6.0  ALBUMIN 2.7*    Recent Labs Lab 10/25/13 1704  LIPASE 13   No results found for this basename: AMMONIA,  in the last 168 hours CBC:  Recent Labs Lab 10/25/13 1704 10/26/13 0147 10/28/13 0540 10/29/13 0632 10/30/13 0023  WBC 11.1* 13.6* 13.4* 13.1* 15.7*  NEUTROABS 8.0*  --   --   --   --   HGB 14.0 12.2 12.4 11.7* 13.1  HCT 39.1  35.4* 36.4 35.3* 38.7  MCV 90.1 89.6 92.2 92.7 91.7  PLT 586* 530* 614* 523* 550*   Cardiac Enzymes:  Recent Labs Lab 10/30/13 0023  TROPONINI <0.30   BNP: BNP (last 3 results) No results found for this basename: PROBNP,  in the last 8760 hours CBG: No results found for this basename: GLUCAP,  in the last 168 hours     Signed:  Kelvin Cellar  [facility] 10/30/2013, 10:30 AM

## 2013-10-30 NOTE — Progress Notes (Signed)
Patient d/c to home, IV removed, prescriptions given and instructions reviewed.

## 2013-10-30 NOTE — Progress Notes (Signed)
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 Upon reassessment patient stated that her pain was dull and radiated to her back and neck. Pt stated that pain increased with breathing and with movement such as laying back. She also stated that she has had this pain for a few years now and thought it could be from her Chrons disease. Pt stated she felt that her pain was getting worse. VS: BP 154/108, HR 103, RR 20 and O2 98. Ordered Hydralazine was given. Provider on call notified of chest pain. EKG and CXR ordered. Both done. EKG was redone per provider order. Pt also requested dilaudid because she felt that when she was getting it the previous night it helped more and felt that the morphine was not helping her pain. Provider ordered dilaudid 1 time dose. Pt states she feels better at this time. Will continue to monitor.

## 2013-11-05 ENCOUNTER — Ambulatory Visit (INDEPENDENT_AMBULATORY_CARE_PROVIDER_SITE_OTHER): Payer: Medicaid Other | Admitting: Family Medicine

## 2013-11-05 ENCOUNTER — Encounter: Payer: Self-pay | Admitting: Family Medicine

## 2013-11-05 ENCOUNTER — Other Ambulatory Visit: Payer: Self-pay | Admitting: Family Medicine

## 2013-11-05 VITALS — BP 150/120 | HR 110 | Temp 98.7°F | Resp 16 | Ht 61.0 in | Wt 118.0 lb

## 2013-11-05 DIAGNOSIS — R109 Unspecified abdominal pain: Secondary | ICD-10-CM

## 2013-11-05 DIAGNOSIS — Z09 Encounter for follow-up examination after completed treatment for conditions other than malignant neoplasm: Secondary | ICD-10-CM

## 2013-11-05 DIAGNOSIS — R000 Tachycardia, unspecified: Secondary | ICD-10-CM

## 2013-11-05 DIAGNOSIS — K5090 Crohn's disease, unspecified, without complications: Secondary | ICD-10-CM

## 2013-11-05 DIAGNOSIS — G8929 Other chronic pain: Secondary | ICD-10-CM

## 2013-11-05 LAB — CBC WITH DIFFERENTIAL/PLATELET
Basophils Absolute: 0.1 10*3/uL (ref 0.0–0.1)
Basophils Relative: 1 % (ref 0–1)
Eosinophils Absolute: 0 10*3/uL (ref 0.0–0.7)
Eosinophils Relative: 0 % (ref 0–5)
HCT: 44.3 % (ref 36.0–46.0)
Hemoglobin: 14.6 g/dL (ref 12.0–15.0)
Lymphocytes Relative: 31 % (ref 12–46)
Lymphs Abs: 3.8 10*3/uL (ref 0.7–4.0)
MCH: 30 pg (ref 26.0–34.0)
MCHC: 33 g/dL (ref 30.0–36.0)
MCV: 91 fL (ref 78.0–100.0)
Monocytes Absolute: 0.9 10*3/uL (ref 0.1–1.0)
Monocytes Relative: 7 % (ref 3–12)
Neutro Abs: 7.6 10*3/uL (ref 1.7–7.7)
Neutrophils Relative %: 61 % (ref 43–77)
Platelets: 695 10*3/uL — ABNORMAL HIGH (ref 150–400)
RBC: 4.87 MIL/uL (ref 3.87–5.11)
RDW: 17.5 % — ABNORMAL HIGH (ref 11.5–15.5)
WBC: 12.4 10*3/uL — ABNORMAL HIGH (ref 4.0–10.5)

## 2013-11-05 MED ORDER — OXYCODONE HCL 10 MG PO TABS: 10.0000 mg | ORAL_TABLET | ORAL | Status: DC | PRN

## 2013-11-05 MED ORDER — PANTOPRAZOLE SODIUM 40 MG PO TBEC: 40.0000 mg | DELAYED_RELEASE_TABLET | Freq: Two times a day (BID) | ORAL | Status: DC

## 2013-11-05 MED ORDER — ALPRAZOLAM 1 MG PO TABS: 1.0000 mg | ORAL_TABLET | Freq: Three times a day (TID) | ORAL | Status: DC | PRN

## 2013-11-05 MED ORDER — METOPROLOL SUCCINATE ER 100 MG PO TB24: 100.0000 mg | ORAL_TABLET | Freq: Every day | ORAL | Status: DC

## 2013-11-05 MED ORDER — ESOMEPRAZOLE MAGNESIUM 40 MG PO CPDR: 40.0000 mg | DELAYED_RELEASE_CAPSULE | Freq: Two times a day (BID) | ORAL | Status: DC

## 2013-11-05 NOTE — Progress Notes (Signed)
   Subjective:    Patient ID: [name], female    DOB: Apr 26, 1981, 33 y.o.   MRN: [id number]  HPI    Review of Systems     Objective:   Physical Exam        Assessment & Plan:  Patient was unable to void after 1 hour and 20 minutes.  Therefore, I made her return her prescriptions for xanax and oxycodone and they were destroyed prior to her leaving the clinic.

## 2013-11-05 NOTE — Progress Notes (Signed)
Subjective:    Patient ID: [name], female    DOB: 08/26/1980, 33 y.o.   MRN: [id number]  HPI Patient was recently admitted to the hospital.  I have included relevant portions of the discharge summary below for my reference:  Admit date: 10/25/2013  Discharge date: 10/30/2013  Time spent: 35 minutes  Recommendations for Outpatient Follow-up:  1. Patient set up to followup with Dr Harlon Ditty, [facility] Department of Surgery, appointment set for 11/08/2013 at 10 AM. 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 female with a Past Medical History of Crohn's disease, chronic back pain who presents to the ED today with the above noted complaint. Per patient, she was discharged on 3/6 after being admitted for almost a similar issue. Since discharge she has not seen her primary care practitioner or a gastroenterologist. She presented today to the emergency room because of pain in the anal region, and it again felt like a rectum had prolapsed yesterday. She also gives a history of having profuse loose watery stools. She claims she is almost incontinent. She complains that all of these symptoms ongoing since last discharge. She also claims that she continues to have diffuse abdominal pain. She denies any vomiting. Denies any fever.  She was evaluated in the emergency room, and found to have a rectal prolapse, the emergency room physician sedated the patient with etomidate and successfully reduced the rectal prolapse. Even after reduction of the rectal prolapse, patient continued to have abdominal pain, as is I was  asked to admit this patient for further evaluation and treatment.  Patient denies any fever, headache, chest pain, shortness of breath. There is no history of nausea or vomiting.  Hospital Course:  Patient is a pleasant 33 year old female with a history of Crohn's disease, medication nonadherence, history of recurrent rectal prolapse, was admitted to the medicine service on 10/26/2013 presenting with pain at the anal region, having rectal prolapse. Patient also complained of diffuse abdominal discomfort. Last CT scan of abdomen and pelvis was performed on 10/09/2013 which showed extensive mucosal thickening within the terminal ileum without evidence for obstruction. Compatible with terminal ileitis and consistent with Crohn's disease. During this hospitalization she was seen and evaluated by Dr. Grandville Silos of colorectal surgery, as she underwent manual reduction twice during this hospitalization. With regard to history of Crohn's disease GI was consulted as she was initially started on IV Solu-Medrol, transition budesonide by mouth daily on 10/29/2013. General surgery recommending patient followup with colorectal surgery specialist at [facility], recommending Dr. Harlon Ditty. Prior to discharge she was set up with an appointment with Dr. Launa Flight scheduled for 11/08/2013 at 10 AM. With regard to GI followup, [facility] requesting that she followup with another gastroenterologist. I spoke with [facility] who requested that patient call office directly. This information was given to patient prior to discharge. She was discharged to her home in stable condition on 10/30/2013.  Procedures:  Manual reduction of rectal prolapse Consultations:  Gastroenterology  Surgery Discharge Exam:  Filed Vitals:    10/30/13 0742   BP:  145/87  Pulse:  85   Temp:    Resp:    General: She is in no acute distress, sleeping prior to my entering the room.  Cardiovascular: Regular rate and rhythm normal S1-S2    Respiratory: Clear to auscultation bilaterally  Abdomen: Mild generalized tenderness to palpation across abdomen  Discharge Instructions  Discharge Orders    Future Orders  Complete By  Expires    Call MD for: difficulty breathing, headache or visual disturbances  As directed     Call MD for: extreme fatigue  As directed     Call MD for: persistant dizziness or light-headedness  As directed     Call MD for: persistant nausea and vomiting  As directed     Call MD for: severe uncontrolled pain  As directed     Call MD for: temperature >100.4  As directed     Diet - low sodium heart healthy  As directed     Discharge instructions  As directed     Comments:    1) Please follow up with Dr Harlon Ditty of Colorectal Surgery at the surgery clinic at [facility] located on the first floor of the main hospital, on November 08, 2013 at 10 am, tel: [phone]  2) Call [facility] at [phone] to set up follow up appointment    Increase activity slowly  As directed         Medication List     STOP taking these medications       ibuprofen 200 MG tablet    Commonly known as: ADVIL,MOTRIN    oxyCODONE-acetaminophen 5-325 MG per tablet    Commonly known as: PERCOCET/ROXICET     TAKE these medications       ALPRAZolam 1 MG tablet    Commonly known as: XANAX    Take 1 tablet (1 mg total) by mouth 3 (three) times daily as needed for anxiety.    budesonide 3 MG 24 hr capsule    Commonly known as: ENTOCORT EC    Take 3 capsules (9 mg total) by mouth daily.    cyclobenzaprine 10 MG tablet    Commonly known as: FLEXERIL    Take 10 mg by mouth 3 (three) times daily as needed for muscle spasms.    diphenhydrAMINE 25 MG tablet    Commonly known as: SOMINEX    Take 25 mg by mouth at bedtime as needed for allergies or sleep.    esomeprazole 40 MG capsule    Commonly known as: NEXIUM    Take 40 mg by mouth 2 (two) times daily before a meal.    fexofenadine 180 MG tablet    Commonly known as:  ALLEGRA    Take 180 mg by mouth daily as needed for allergies or rhinitis.    fexofenadine 180 MG tablet    Commonly known as: ALLEGRA    Take 1 tablet (180 mg total) by mouth daily.    montelukast 10 MG tablet    Commonly known as: SINGULAIR    Take 10 mg by mouth daily as needed (for shortness of breath).    Oxycodone HCl 10 MG Tabs    Take 1 tablet (10 mg total) by mouth every 4 (four) hours as needed for severe pain.    polyethylene glycol packet    Commonly known as: MIRALAX / GLYCOLAX    Take 34 g by mouth daily.    potassium chloride 20 MEQ/15ML (10%) Soln    Take 15  mLs (20 mEq total) by mouth 2 (two) times daily.    senna-docusate 8.6-50 MG per tablet    Commonly known as: Senokot-S    Take 1 tablet by mouth 2 (two) times daily.      I have not seen this patient since August 2014 and that was the only time I had seen her in several years.  I have been concerned about drug-seeking behavior and possible drug abuse in this patient in the past as I explained to her today.  Today she is complaining of severe lower abdominal pain.  She has been taking oxycodone 10 mg every 4 hours since discharge and she is now out of medication.  She is also using xanax 1 mg potid for anxiety. She denies rectal prolapse.  She denies diarrhea or hematochezia.  She is scheduled to see surgery at [facility] on 4/3 for her recurrent rectal prolapse.  She has been unable to make any follow up appointment with GI for her Crohns.  Currently she is on enterocort 9 mg/day.  Her blood pressure and heart rate is extremely high today which she attribute to pain, anxiety, and dehydration.  However, her BP and pulse were controlled priro to discharge in hospital.  She denies any stimulant abuse or cocaine abuse.   Past Medical History  Diagnosis Date  . VITAMIN B1 DEFICIENCY 09/21/2009  . B12 DEFICIENCY 04/28/2009  . ANXIETY 03/24/2009  . SMOKER 12/02/2009  . ADHD 12/02/2009  . COMMON MIGRAINE 02/05/2010  . HYPERTENSION  03/24/2009  . GERD 03/24/2009  . CROHN'S [facility] INTESTINE 05/19/2009  . Cellulitis and abscess of leg, except foot 02/05/2010  . ECZEMA 05/20/2010  . 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 Cervicalgia 12/02/2009  . BURSITIS, RIGHT KNEE 02/05/2010  . HEADACHE, CHRONIC 03/24/2009  . Abdominal pain, unspecified site 03/24/2009  . OTITIS MEDIA, LEFT 08/12/2010  . Wheezing 08/12/2010  . Allergic rhinitis, cause unspecified 01/21/2011  . Spine pain 01/21/2011    neck and thoracic spine  . Endometriosis 08/04/2011  . Osteoarthritis   . Fibromyalgia    Past Surgical History  Procedure Laterality Date  . Endometrial ablation  01/2009    Thinks laproscopic with possible transvaginal  . Breast enhancement surgery  2004   Current Outpatient Prescriptions on File Prior to Visit  Medication Sig Dispense Refill  . budesonide (ENTOCORT EC) 3 MG 24 hr capsule Take 3 capsules (9 mg total) by mouth daily.  30 capsule  0  . cyclobenzaprine (FLEXERIL) 10 MG tablet Take 10 mg by mouth 3 (three) times daily as needed for muscle spasms.       . fexofenadine (ALLEGRA) 180 MG tablet Take 1 tablet (180 mg total) by mouth daily.  30 tablet  11  . montelukast (SINGULAIR) 10 MG tablet Take 10 mg by mouth daily as needed (for shortness of breath).      . polyethylene glycol (MIRALAX / GLYCOLAX) packet Take 34 g by mouth daily.  14 each  0  . [DISCONTINUED] amLODipine (NORVASC) 10 MG tablet Take 1 tablet (10 mg total) by mouth daily.  90 tablet  3  . [DISCONTINUED] pregabalin (LYRICA) 50 MG capsule Take 1 capsule (50 mg total) by mouth 3 (three) times daily.  90 capsule  5   No current facility-administered medications on file prior to visit.   Allergies  Allergen Reactions  . Penicillins Hives  . Doxycycline Other (See Comments)    unknown   History   Social History  . Marital Status:  Single    Spouse Name: N/A    Number of Children: 1  . Years of Education: N/A   Occupational History  . social security disability at  [employer] Topics  . Smoking status: Current Every Day Smoker -- 0.50 packs/day for 14 years    Types: Cigarettes  . Smokeless tobacco: Never Used     Comment: 5 daily  . Alcohol Use: Yes     Comment: occasionally  . Drug Use: No  . Sexual Activity: No   Other Topics Concern  . Not on file   Social History Narrative   Daily caffeine 1 cup   Patient does not get regular exercise   Lives with Fuller Mandril and baby son born 11/2010       Review of Systems  Gastrointestinal: Positive for nausea, abdominal pain and rectal pain. Negative for vomiting, diarrhea, constipation and blood in stool.  All other systems reviewed and are negative.       Objective:   Physical Exam  Vitals reviewed. Constitutional: She is oriented to person, place, and time. She appears cachectic. She is active.  Non-toxic appearance. She has a sickly appearance. She does not appear ill. No distress.  HENT:  Right Ear: External ear normal.  Left Ear: External ear normal.  Nose: Nose normal.  Mouth/Throat: Oropharynx is clear and moist. No oropharyngeal exudate.  Eyes: Conjunctivae are normal. No scleral icterus.  Neck: Neck supple. No JVD present. No thyromegaly present.  Cardiovascular: Regular rhythm and normal heart sounds.  Tachycardia present.   Pulmonary/Chest: Effort normal and breath sounds normal. No respiratory distress. She has no wheezes. She has no rales. She exhibits no tenderness.  Abdominal: Soft. Bowel sounds are normal. She exhibits no distension and no mass. There is tenderness. There is no rebound and no guarding.  Lymphadenopathy:    She has no cervical adenopathy.  Neurological: She is alert and oriented to person, place, and time.  Skin: Skin is warm. No rash noted. No erythema. No pallor.          Assessment & Plan:  1. Chronic abdominal pain In good faith, I will treat this patient, but I have reservations about possible drug abuse.  I will obtain UDS.  I  will wean down on oxycodone to 10 mg pobid (60).  If UDS shows illicit drug use, no further controlled substances from this office.  I will consult GI for management of her Crohns. - CBC with Differential - COMPLETE METABOLIC PANEL WITH GFR - Prescript Monitor Profile(13) - Magnesium - Vitamin B12 - Ambulatory referral to Gastroenterology  2. Tachycardia In the past, she has had severe electrolyte disturbances, I will check cmp, mg, K and replete as necessary.  Begin Toprol Xl 25 mg poqday and recheck heart rate on Thursday.  Push gatorade for dehydartion.  I wil treat her pain with oxycodone 10 mg pobid. - metoprolol succinate (TOPROL-XL) 100 MG 24 hr tablet; Take 1 tablet (100 mg total) by mouth daily. Take with or immediately following a meal.  Dispense: 90 tablet; Refill: 3  3. Hospital discharge follow-up In future, I will wean down xanax use and start SSRI.  May benefit from psychiatrist consult.  4. Crohn disease  - Ambulatory referral to Gastroenterology

## 2013-11-05 NOTE — Addendum Note (Signed)
Addended by: Shary Decamp B on: 11/05/2013 03:40 PM   Modules accepted: Orders, Medications

## 2013-11-06 LAB — COMPLETE METABOLIC PANEL WITH GFR
ALT: 11 U/L (ref 0–35)
AST: 14 U/L (ref 0–37)
Albumin: 4 g/dL (ref 3.5–5.2)
Alkaline Phosphatase: 74 U/L (ref 39–117)
BUN: 11 mg/dL (ref 6–23)
CO2: 24 mEq/L (ref 19–32)
Calcium: 8.8 mg/dL (ref 8.4–10.5)
Chloride: 102 mEq/L (ref 96–112)
Creat: 0.87 mg/dL (ref 0.50–1.10)
GFR, Est African American: >89 mL/min
GFR, Est Non African American: 88 mL/min
Glucose, Bld: 86 mg/dL (ref 70–99)
Potassium: 4.5 mEq/L (ref 3.5–5.3)
Sodium: 140 mEq/L (ref 135–145)
Total Bilirubin: 0.5 mg/dL (ref 0.2–1.2)
Total Protein: 6.3 g/dL (ref 6.0–8.3)

## 2013-11-06 LAB — VITAMIN B12: Vitamin B-12: 323 pg/mL (ref 211–911)

## 2013-11-06 LAB — MAGNESIUM: Magnesium: 0.9 mg/dL — ABNORMAL LOW (ref 1.5–2.5)

## 2013-11-07 ENCOUNTER — Other Ambulatory Visit: Payer: Self-pay | Admitting: *Deleted

## 2013-11-07 DIAGNOSIS — R790 Abnormal level of blood mineral: Secondary | ICD-10-CM

## 2013-11-07 LAB — BENZODIAZEPINES (GC/LC/MS), URINE
Alprazolam (GC/LC/MS), ur confirm: 67 ng/mL — AB
Alprazolam metabolite (GC/LC/MS), ur confirm: 107 ng/mL — AB
Clonazepam metabolite (GC/LC/MS), ur confirm: NEGATIVE ng/mL
Diazepam (GC/LC/MS), ur confirm: NEGATIVE ng/mL
Estazolam (GC/LC/MS), ur confirm: NEGATIVE ng/mL
Flunitrazepam metabolite (GC/LC/MS), ur confirm: NEGATIVE ng/mL
Flurazepam metabolite (GC/LC/MS), ur confirm: NEGATIVE ng/mL
Lorazepam (GC/LC/MS), ur confirm: NEGATIVE ng/mL
Midazolam (GC/LC/MS), ur confirm: NEGATIVE ng/mL
Nordiazepam (GC/LC/MS), ur confirm: NEGATIVE ng/mL
Oxazepam (GC/LC/MS), ur confirm: NEGATIVE ng/mL
Temazepam (GC/LC/MS), ur confirm: NEGATIVE ng/mL
Triazolam metabolite (GC/LC/MS), ur confirm: NEGATIVE ng/mL

## 2013-11-07 LAB — AMPHETAMINES (GC/LC/MS), URINE
Amphetamine GC/MS Conf: 1439 ng/mL — AB
MDA GC/MS confirm: NEGATIVE ng/mL
MDEA GC/MS Conf: NEGATIVE ng/mL
MDMA GC/MS Conf: NEGATIVE ng/mL
Methamphetamine Quant, Ur: NEGATIVE ng/mL

## 2013-11-07 LAB — TRAMADOL, URINE
N-DESMETHYL-CIS-TRAMADOL: 2460 ng/mL — AB
Tramadol, Urine: 5561 ng/mL — AB

## 2013-11-07 MED ORDER — MAGNESIUM OXIDE 400 MG PO TABS: 400.0000 mg | ORAL_TABLET | Freq: Two times a day (BID) | ORAL | Status: DC

## 2013-11-07 NOTE — Telephone Encounter (Signed)
Per orders noted on labs, medication sent to pharmacy.   Call placed to patient and patient made aware.

## 2013-11-08 LAB — PRESCRIPTION MONITORING PROFILE (13 PANEL)
Barbiturate Screen, Urine: NEGATIVE ng/mL
Buprenorphine, Urine: NEGATIVE ng/mL
Cannabinoid Scrn, Ur: NEGATIVE ng/mL
Cocaine Metabolites: NEGATIVE ng/mL
Creatinine, Urine: 42.16 mg/dL (ref >20.0)
Fentanyl, Ur: NEGATIVE ng/mL
Meperidine, Ur: NEGATIVE ng/mL
Methadone Screen, Urine: NEGATIVE ng/mL
Nitrites, Initial: NEGATIVE ug/mL
Opiate Screen, Urine: NEGATIVE ng/mL
Oxycodone Screen, Ur: NEGATIVE ng/mL
Propoxyphene: NEGATIVE ng/mL
pH, Initial: 5.9 pH (ref 4.5–8.9)

## 2013-12-03 ENCOUNTER — Encounter: Payer: Self-pay | Admitting: *Deleted

## 2014-12-22 ENCOUNTER — Encounter: Payer: Self-pay | Admitting: Gastroenterology

## 2015-01-22 NOTE — Telephone Encounter (Signed)
Opened by mistake.

## 2015-10-11 IMAGING — US US ABDOMEN COMPLETE
1 series · 14 of 25 positions shown · non-contrast
Comparison: None.

CLINICAL DATA: Upper abdominal pain.

EXAM:
ABDOMEN ULTRASOUND COMPLETE

[Series 1: us abdomen complete · 0.19mm/px · 14 of 65 slices shown]
[im 1/65]
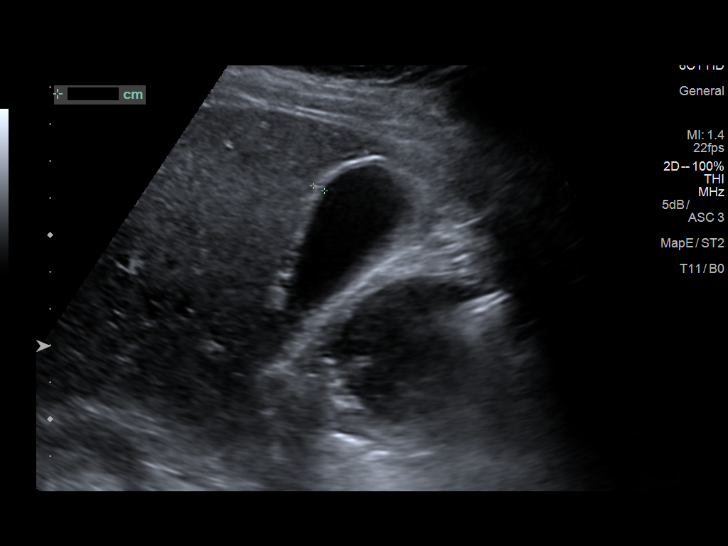
[im 6/65]
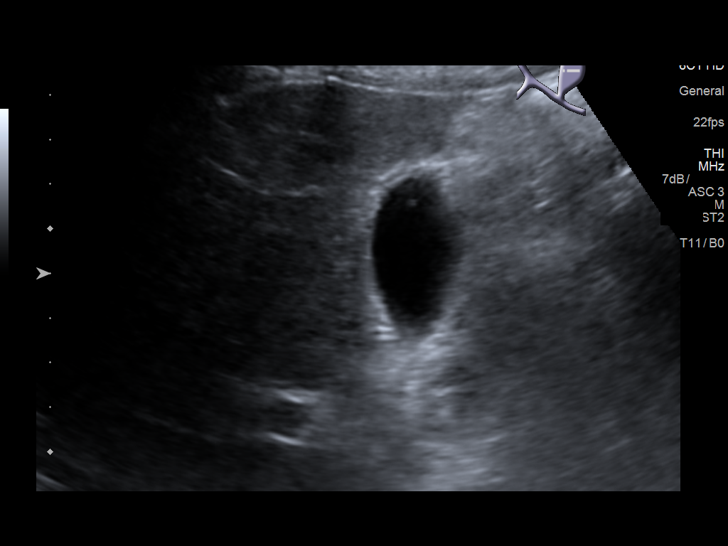
[im 11/65]
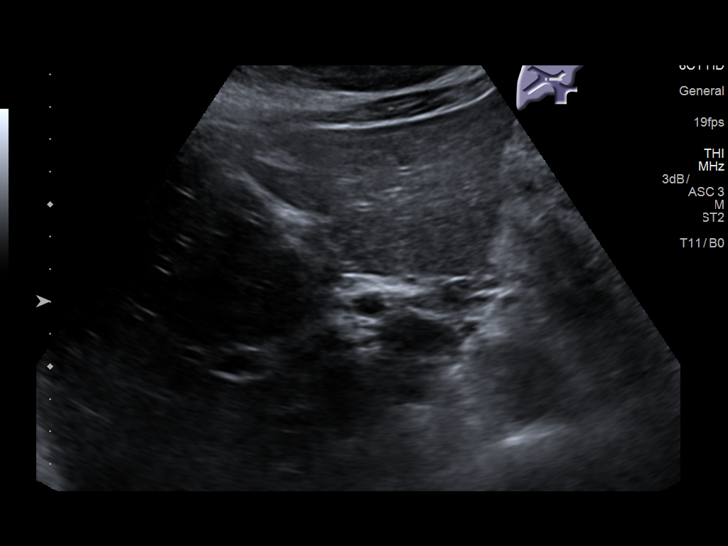
[im 17/65]
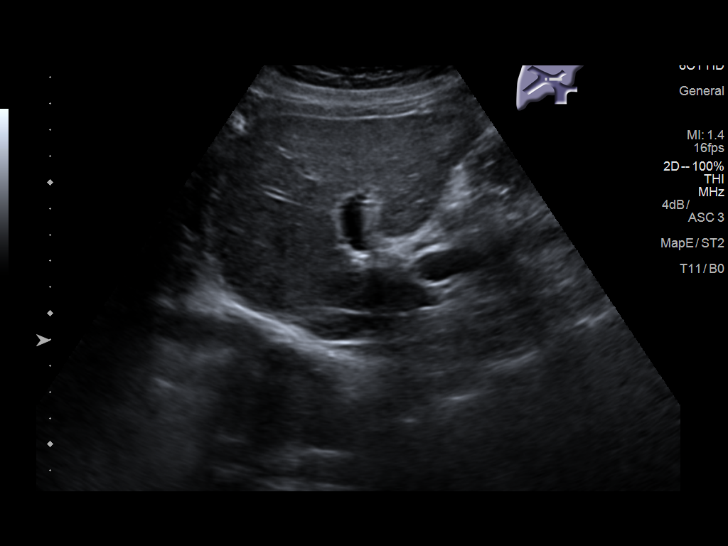
[im 22/65]
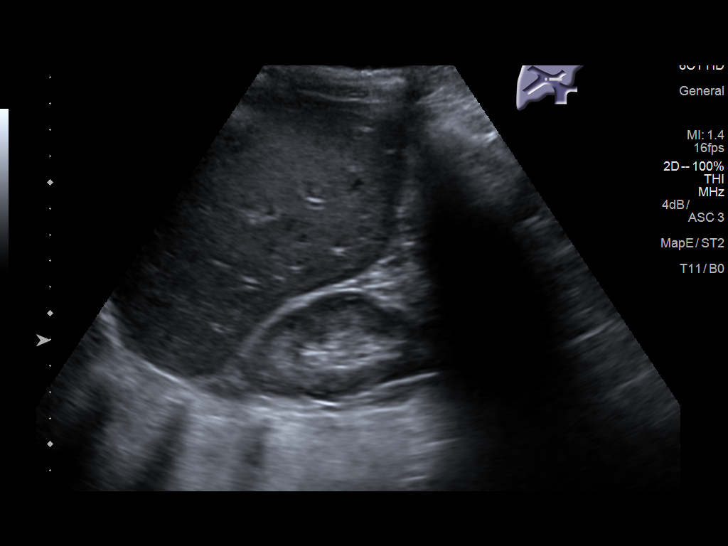
[im 25/65]
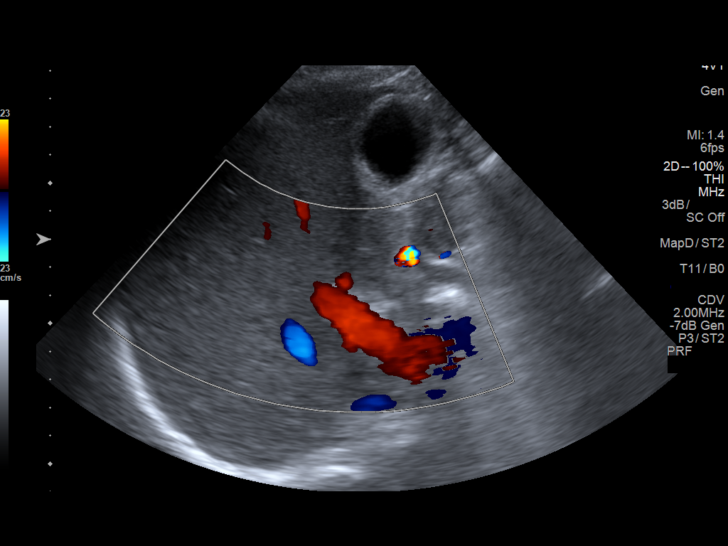
[im 30/65]
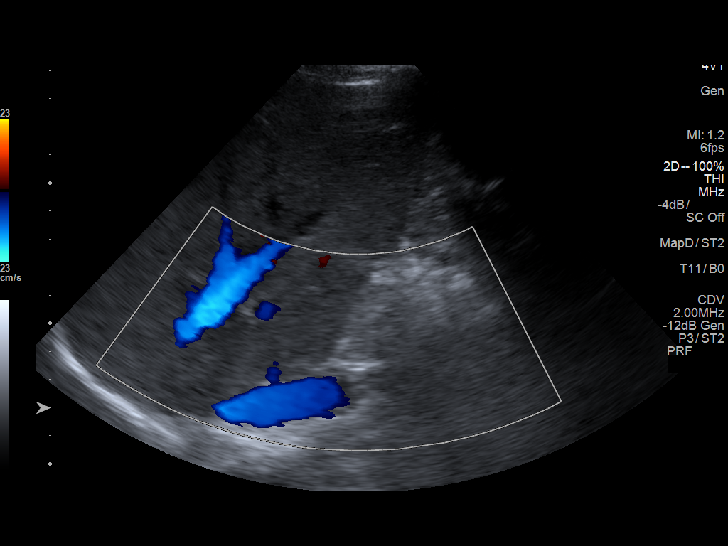
[im 35/65]
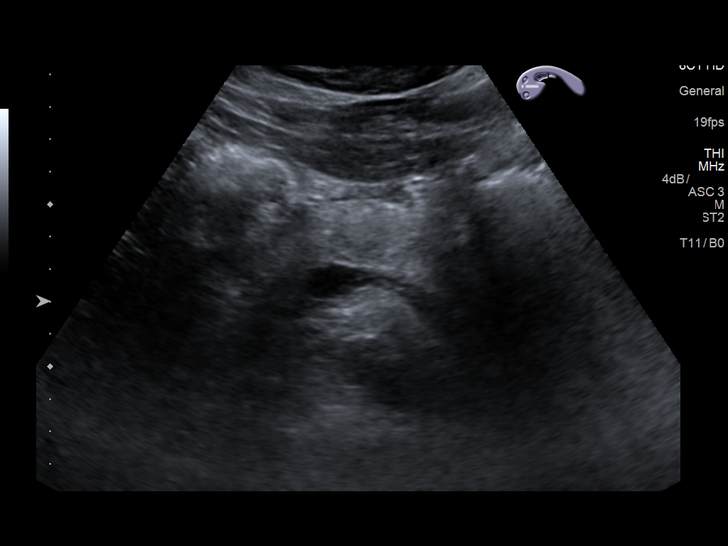
[im 41/65]
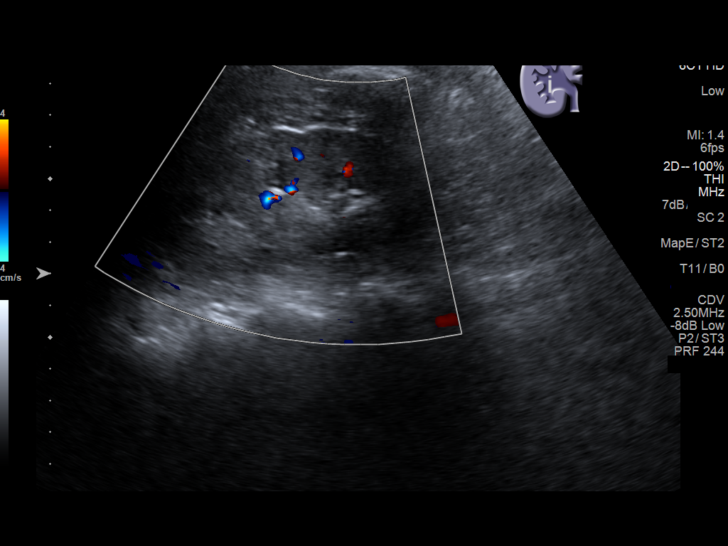
[im 43/65]
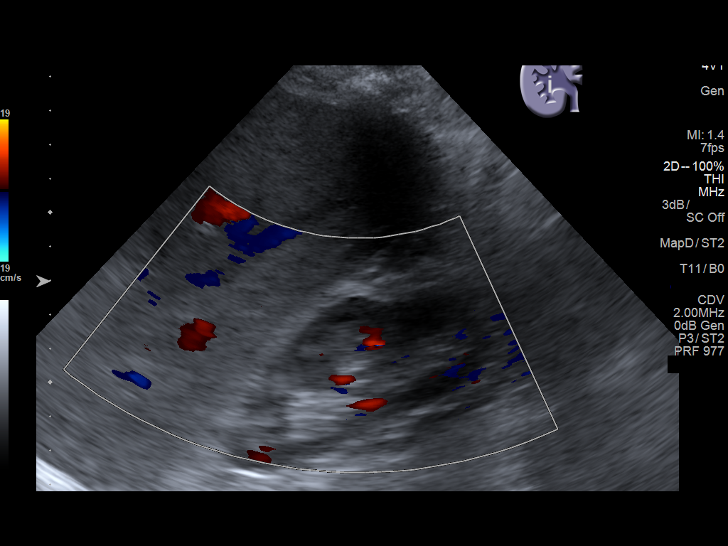
[im 49/65]
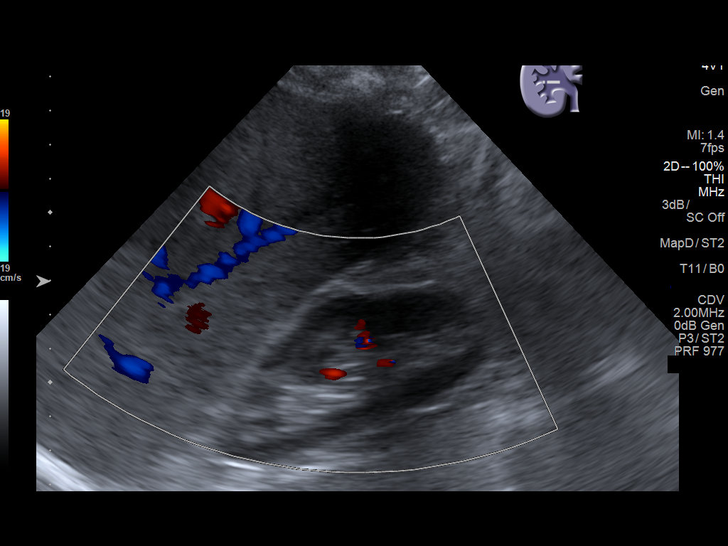
[im 54/65]
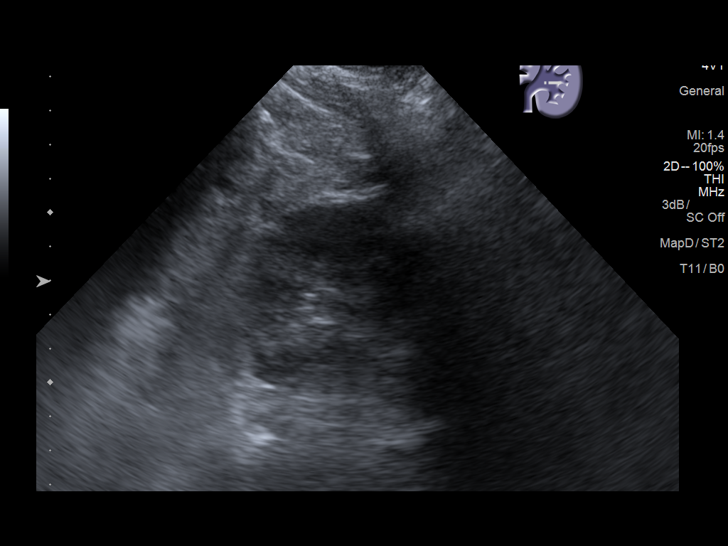
[im 59/65]
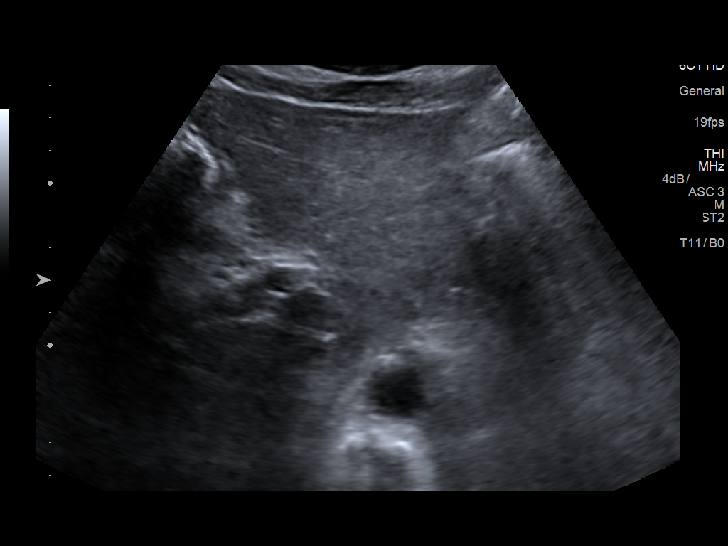
[im 65/65]
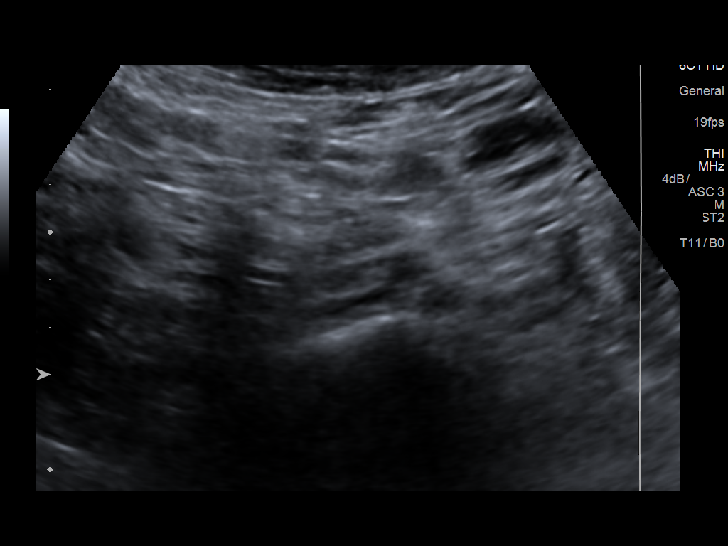

[14 of 25 positions shown; findings below may reference images not displayed]

FINDINGS: Gallbladder: No gallstones or wall thickening visualized. No
sonographic Murphy sign noted by sonographer.

Common bile duct: Diameter: 3.5 mm which is within normal limits.

Liver: No focal lesion identified. Within normal limits in
parenchymal echogenicity. Portal vein is patent on color Doppler
imaging with normal direction of blood flow towards the liver.

IVC: No abnormality visualized.

Pancreas: Visualized portion unremarkable.

Spleen: Size and appearance within normal limits.

Right Kidney: Length: 9 cm. Echogenicity within normal limits. No
mass or hydronephrosis visualized.

Left Kidney: Length: 8.5 cm. Echogenicity within normal limits. No
mass or hydronephrosis visualized.

Abdominal aorta: No aneurysm visualized.

Other findings: None.
IMPRESSION: No significant abnormality seen in the abdomen.

## 2016-04-18 ENCOUNTER — Ambulatory Visit (HOSPITAL_COMMUNITY)
Admission: EM | Admit: 2016-04-18 | Discharge: 2016-04-18 | Discharge status: Against Medical Advice | Payer: Medicaid Other | Attending: Family Medicine | Admitting: Family Medicine

## 2016-04-18 ENCOUNTER — Emergency Department (HOSPITAL_COMMUNITY): Payer: Medicaid Other

## 2016-04-18 ENCOUNTER — Encounter (HOSPITAL_COMMUNITY): Payer: Self-pay | Admitting: *Deleted

## 2016-04-18 ENCOUNTER — Emergency Department (HOSPITAL_COMMUNITY)
Admission: EM | Admit: 2016-04-18 | Discharge: 2016-04-18 | Disposition: A | Discharge status: Home / Self Care | Payer: Medicaid Other | Attending: Emergency Medicine | Admitting: Emergency Medicine

## 2016-04-18 ENCOUNTER — Ambulatory Visit (HOSPITAL_COMMUNITY): Payer: Medicaid Other

## 2016-04-18 ENCOUNTER — Encounter (HOSPITAL_COMMUNITY): Payer: Self-pay | Admitting: Emergency Medicine

## 2016-04-18 DIAGNOSIS — W540XXA Bitten by dog, initial encounter: Secondary | ICD-10-CM | POA: Insufficient documentation

## 2016-04-18 DIAGNOSIS — I10 Essential (primary) hypertension: Secondary | ICD-10-CM | POA: Diagnosis not present

## 2016-04-18 DIAGNOSIS — Y929 Unspecified place or not applicable: Secondary | ICD-10-CM | POA: Diagnosis not present

## 2016-04-18 DIAGNOSIS — F909 Attention-deficit hyperactivity disorder, unspecified type: Secondary | ICD-10-CM | POA: Insufficient documentation

## 2016-04-18 DIAGNOSIS — Y939 Activity, unspecified: Secondary | ICD-10-CM | POA: Diagnosis not present

## 2016-04-18 DIAGNOSIS — F17210 Nicotine dependence, cigarettes, uncomplicated: Secondary | ICD-10-CM | POA: Diagnosis not present

## 2016-04-18 DIAGNOSIS — Z79899 Other long term (current) drug therapy: Secondary | ICD-10-CM | POA: Diagnosis not present

## 2016-04-18 DIAGNOSIS — S91352A Open bite, left foot, initial encounter: Secondary | ICD-10-CM | POA: Insufficient documentation

## 2016-04-18 DIAGNOSIS — T148 Other injury of unspecified body region: Secondary | ICD-10-CM

## 2016-04-18 DIAGNOSIS — Z23 Encounter for immunization: Secondary | ICD-10-CM | POA: Insufficient documentation

## 2016-04-18 DIAGNOSIS — Z765 Malingerer [conscious simulation]: Secondary | ICD-10-CM

## 2016-04-18 DIAGNOSIS — Y999 Unspecified external cause status: Secondary | ICD-10-CM | POA: Diagnosis not present

## 2016-04-18 IMAGING — CR DG FOOT 2V*L*
2 series · 2 of 2 positions shown · non-contrast
Comparison: None.

CLINICAL DATA: Dog bite wound at the base of the left fourth toe.

EXAM:
LEFT FOOT - 2 VIEW

[foot ap]
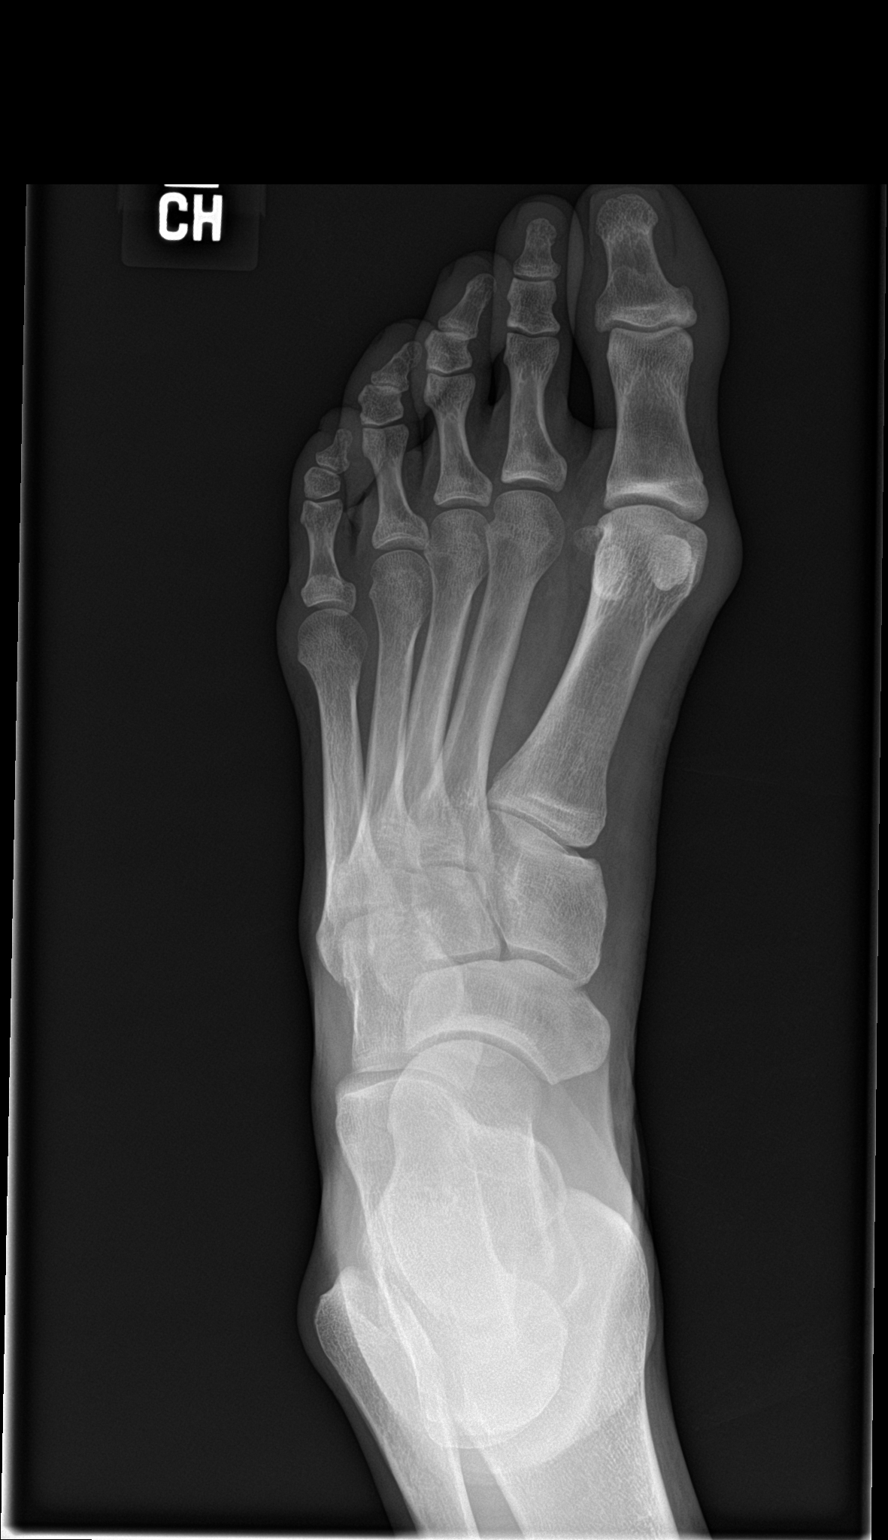

[foot lat]
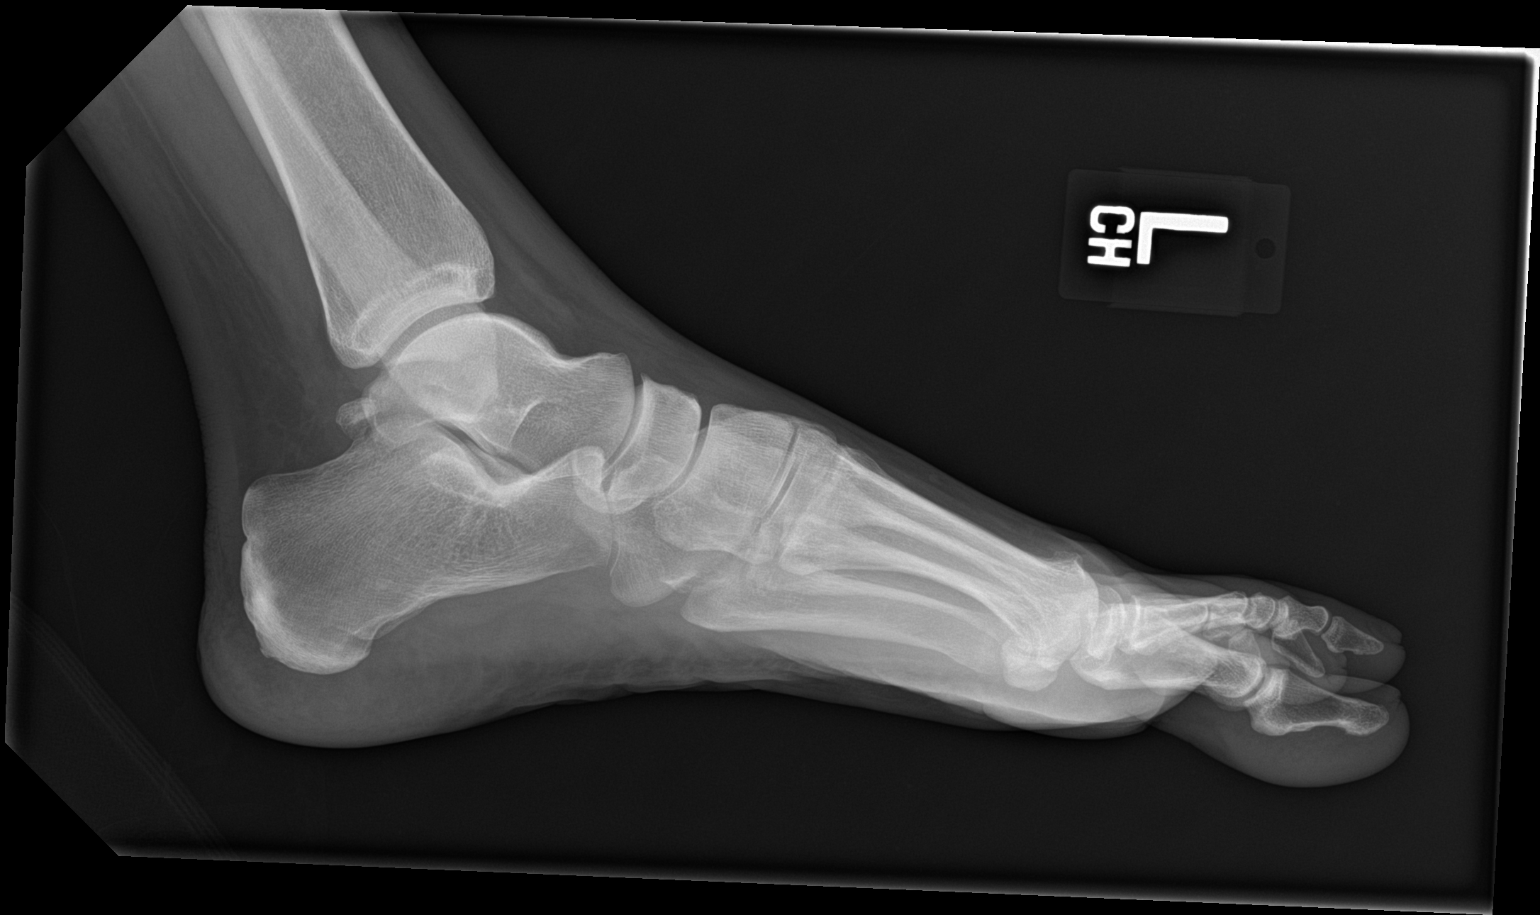

[2 of 2 positions shown; findings below may reference images not displayed]

FINDINGS: The examination is limited by the lack of an oblique view. No
fracture, dislocation or radiopaque foreign body seen.
IMPRESSION: Limited two-view examination demonstrating no visible abnormality.

## 2016-04-18 MED ORDER — OXYCODONE HCL 5 MG PO TABS
5.0000 mg | ORAL_TABLET | Freq: Once | ORAL | Status: AC
Start: 2016-04-18 — End: 2016-04-18
  Administered 2016-04-18: 5 mg via ORAL
  Filled 2016-04-18: qty 1

## 2016-04-18 MED ORDER — TETANUS-DIPHTH-ACELL PERTUSSIS 5-2.5-18.5 LF-MCG/0.5 IM SUSP
0.5000 mL | Freq: Once | INTRAMUSCULAR | Status: AC
  Administered 2016-04-18: 0.5 mL via INTRAMUSCULAR
  Filled 2016-04-18: qty 0.5

## 2016-04-18 MED ORDER — OXYCODONE HCL 5 MG PO TABS: 5.0000 mg | ORAL_TABLET | ORAL | 0 refills | Status: DC | PRN

## 2016-04-18 MED ORDER — LIDOCAINE HCL (PF) 1 % IJ SOLN
5.0000 mL | Freq: Once | INTRAMUSCULAR | Status: AC
  Administered 2016-04-18: 5 mL
  Filled 2016-04-18: qty 5

## 2016-04-18 MED ORDER — HYDROCODONE-ACETAMINOPHEN 5-325 MG PO TABS
1.0000 | ORAL_TABLET | Freq: Once | ORAL | Status: AC
Start: 2016-04-18 — End: 2016-04-18
  Administered 2016-04-18: 1 via ORAL

## 2016-04-18 MED ORDER — KETOROLAC TROMETHAMINE 60 MG/2ML IM SOLN
30.0000 mg | Freq: Once | INTRAMUSCULAR | Status: AC
  Administered 2016-04-18: 30 mg via INTRAMUSCULAR
  Filled 2016-04-18: qty 2

## 2016-04-18 MED ORDER — CIPROFLOXACIN HCL 500 MG PO TABS: 500.0000 mg | ORAL_TABLET | Freq: Two times a day (BID) | ORAL | 0 refills | Status: DC

## 2016-04-18 MED ORDER — IBUPROFEN 600 MG PO TABS
600.0000 mg | ORAL_TABLET | Freq: Once | ORAL | Status: AC
  Administered 2016-04-18: 600 mg via ORAL

## 2016-04-18 MED ORDER — IBUPROFEN 800 MG PO TABS
ORAL_TABLET | ORAL | Status: AC
  Filled 2016-04-18: qty 1

## 2016-04-18 MED ORDER — CLINDAMYCIN HCL 300 MG PO CAPS: 300.0000 mg | ORAL_CAPSULE | Freq: Four times a day (QID) | ORAL | 0 refills | Status: DC

## 2016-04-18 MED ORDER — HYDROCODONE-ACETAMINOPHEN 5-325 MG PO TABS
1.0000 | ORAL_TABLET | Freq: Once | ORAL | Status: DC
  Filled 2016-04-18: qty 1

## 2016-04-18 MED ORDER — OXYCODONE-ACETAMINOPHEN 5-325 MG PO TABS
1.0000 | ORAL_TABLET | Freq: Once | ORAL | Status: AC
  Administered 2016-04-18: 1 via ORAL
  Filled 2016-04-18: qty 1

## 2016-04-18 MED ORDER — CIPROFLOXACIN HCL 500 MG PO TABS
500.0000 mg | ORAL_TABLET | Freq: Once | ORAL | Status: AC
  Administered 2016-04-18: 500 mg via ORAL
  Filled 2016-04-18: qty 1

## 2016-04-18 MED ORDER — HYDROCODONE-ACETAMINOPHEN 5-325 MG PO TABS
ORAL_TABLET | ORAL | Status: AC
  Filled 2016-04-18: qty 1

## 2016-04-18 MED ORDER — CLINDAMYCIN HCL 150 MG PO CAPS
300.0000 mg | ORAL_CAPSULE | Freq: Once | ORAL | Status: AC
  Administered 2016-04-18: 300 mg via ORAL
  Filled 2016-04-18: qty 2

## 2016-04-18 NOTE — ED Provider Notes (Signed)
CSN: [id number]     Arrival date & time 04/18/16  1521 History   First MD Initiated Contact with Patient 04/18/16 1724     Chief Complaint  Patient presents with  . Animal Bite   (Consider location/radiation/quality/duration/timing/severity/associated sxs/prior Treatment) HPI [name] is a 35 y.o. female presenting to UC with c/o dog bite to Left foot that occurred earlier this afternoon by a full grown pit-bull owned by pt's friend who has accompanied her.  Rabies vaccines UTD.  Pt states her tetanus is UTD.  Bleeding controlled PTA with pressure bandage and elevation.  No pain medication taken PTA.  Pain is aching and sore, 10/10, worse with palpation and movement of toes.  No other injuries.      Past Medical History:  Diagnosis Date  . Abdominal pain, unspecified site 03/24/2009  . 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 ADHD 12/02/2009  . Allergic rhinitis, cause unspecified 01/21/2011  . ANXIETY 03/24/2009  . B12 DEFICIENCY 04/28/2009  . BURSITIS, RIGHT KNEE 02/05/2010  . Cellulitis and abscess of leg, except foot 02/05/2010  . Cervicalgia 12/02/2009  . COMMON MIGRAINE 02/05/2010  . CROHN'S [facility] INTESTINE 05/19/2009  . ECZEMA 05/20/2010  . Endometriosis 08/04/2011  . Fibromyalgia   . GERD 03/24/2009  . HEADACHE, CHRONIC 03/24/2009  . HYPERTENSION 03/24/2009  . Osteoarthritis   . OTITIS MEDIA, LEFT 08/12/2010  . SMOKER 12/02/2009  . Spine pain 01/21/2011   neck and thoracic spine  . VITAMIN B1 DEFICIENCY 09/21/2009  . Wheezing 08/12/2010   Past Surgical History:  Procedure Laterality Date  . BREAST ENHANCEMENT SURGERY  2004  . ENDOMETRIAL ABLATION  01/2009   Thinks laproscopic with possible transvaginal   Family History  Problem Relation Age of Onset  . Cancer Mother     breast  . Clotting disorder Mother   . Cancer Maternal Grandmother     Stomach Cancer  . Cancer Maternal Grandfather     Esophageal Cancer   Social History  Substance Use Topics  . Smoking status: Current  Every Day Smoker    Packs/day: 0.50    Years: 14.00    Types: Cigarettes  . Smokeless tobacco: Never Used     Comment: 5 daily  . Alcohol use Yes     Comment: occasionally   OB History    No data available     Review of Systems  Musculoskeletal: Positive for arthralgias, gait problem ( due to wound on Left foot), joint swelling and myalgias.  Skin: Positive for color change and wound. Negative for rash.  Neurological: Negative for weakness and numbness.    Allergies  Penicillins and Doxycycline  Home Medications   Prior to Admission medications   Medication Sig Start Date End Date Taking? Authorizing Provider  ALPRAZolam Duanne Moron) 1 MG tablet Take 1 tablet (1 mg total) by mouth 3 (three) times daily as needed for anxiety. 11/05/13   Susy Frizzle, MD  budesonide (ENTOCORT EC) 3 MG 24 hr capsule Take 3 capsules (9 mg total) by mouth daily. 10/30/13   Kelvin Cellar, MD  cyclobenzaprine (FLEXERIL) 10 MG tablet Take 10 mg by mouth 3 (three) times daily as needed for muscle spasms.     Historical Provider, MD  fexofenadine (ALLEGRA) 180 MG tablet Take 1 tablet (180 mg total) by mouth daily. 03/28/12 11/05/13  Biagio Borg, MD  magnesium oxide (MAG-OX) 400 MG tablet Take 1 tablet (400 mg total) by mouth 2 (two) times daily. 11/07/13   Susy Frizzle, MD  metoprolol succinate (TOPROL-XL) 100 MG 24 hr tablet Take 1 tablet (100 mg total) by mouth daily. Take with or immediately following a meal. 11/05/13   Susy Frizzle, MD  montelukast (SINGULAIR) 10 MG tablet Take 10 mg by mouth daily as needed (for shortness of breath).    Historical Provider, MD  Oxycodone HCl 10 MG TABS Take 1 tablet (10 mg total) by mouth every 4 (four) hours as needed. 11/05/13   Susy Frizzle, MD  pantoprazole (PROTONIX) 40 MG tablet Take 1 tablet (40 mg total) by mouth 2 (two) times daily. 11/05/13   Susy Frizzle, MD  polyethylene glycol [facility] Floria Raveling) packet Take 34 g by mouth daily. 10/25/13   Wendall Papa, MD   Meds Ordered and Administered this Visit   Medications  HYDROcodone-acetaminophen (NORCO/VICODIN) 5-325 MG per tablet 1 tablet (1 tablet Oral Given 04/18/16 1732)  ibuprofen (ADVIL,MOTRIN) tablet 600 mg (600 mg Oral Given 04/18/16 1733)    There were no vitals taken for this visit. No data found.   Physical Exam  Constitutional: She is oriented to person, place, and time. She appears well-developed and well-nourished.  HENT:  Head: Normocephalic and atraumatic.  Eyes: EOM are normal.  Neck: Normal range of motion.  Cardiovascular: Normal rate.   Pulmonary/Chest: Effort normal.  Musculoskeletal: She exhibits edema and tenderness.       Feet:  Left foot: mild edema to distal aspect of foot, 2 small puncture wounds (See skin exam). Limited ROM toes due to severe pain in 4th toe.  Neurological: She is alert and oriented to person, place, and time.  Skin: Skin is warm and dry. Capillary refill takes less than 2 seconds.  Left foot, dorsal aspect: 17mm puncture wound over base of 3rd metatarsal. No active bleeding. No foreign bodies visualized.    Fourth toe- 0.5cm puncture wound type laceration. (will wait on imaging and pain medication before further exploring wound)   Psychiatric: She has a normal mood and affect. Her behavior is normal.  Nursing note and vitals reviewed.   Urgent Care Course   Clinical Course    Procedures (including critical care time)  Labs Review Labs Reviewed - No data to display  Imaging Review No results found.    MDM  No diagnosis found.   Pt presenting to UC with 2 small puncture wounds to Left foot, more specifically Left 4th toe.  Due to severe pain while just taking bandage off, offered pain medication prior to imaging being performed.  Questioned pt if she had any allergies to medication, she stated "No" despite medical records showing PCN and Doxycycline.   Pt also asked if she wanted anything to eat or drink prior to pain  medication being given as it could cause stomach upset.  Pt stated "No, I'm fine."   Pt became unhappy when RN brought her hydrocodone-acetaminophen (5-325mg ) and ibuprofen 600mg .  Pt then stated she could not have any acetaminophen or ibuprofen due to hx of Crohns disease.  Neither medication listed in allergy list.  Pt questioned what dosage of hydrocodone she was being given, then stated "5mg  won't do anything."   Advised pt imaging would be performed to check for fracture, then additional evaluation and treatment of wound would be performed.  Pt left prior to full workup being complete as she was upset nothing more than Vicodin would be given until imaging had been performed on Left foot to check for fracture.  Pt has hx of chronic narcotic  dependence and small puncture wounds did not appear to warrant morphine or stronger pain medication at this time.     Noland Fordyce, PA-C 04/18/16 1823

## 2016-04-18 NOTE — Discharge Instructions (Signed)
Return for worsening symptoms.    

## 2016-04-18 NOTE — ED Notes (Signed)
Patient came from [place]. Scarlett, RN called and stated that patient was seen at [facility] and was to be discharged after given Vicodin and Ibuprofen. Pt became angry with the provider and left stating they would come down here. Pt was requesting Oxycodone.

## 2016-04-18 NOTE — ED Triage Notes (Signed)
Pt was bitten on L foot by pit bull around 1700.   Bleeding controlled.

## 2016-04-18 NOTE — ED Notes (Signed)
Pt. Refused  Vicodin and Ibuprofen, stated I have Crohn's disease and I can only take certain medication cause of the Crohn's disease.  Husband stated, we are leaving and going to ER. Medication returned. Called [facility] to inform them of the pt.  And the outcome of their visit.

## 2016-04-18 NOTE — ED Provider Notes (Signed)
[place] DEPT Provider Note   CSN: [id number] Arrival date & time: 04/18/16  1804     History   Chief Complaint Chief Complaint  Patient presents with  . Animal Bite    HPI [name] is a 35 y.o. female who presented to UC with c/o dog bite to Left foot that occurred earlier this afternoon by a full grown pit-bull owned by pt's friend.  Rabies vaccines UTD.  Pt states she thinks she may have had a tetanus shot a few years ago but is not sure so she would like to have one.  Bleeding controlled PTA with pressure bandage and elevation.  No pain medication taken PTA.  Pain is aching and sore, 10/10. Pain increases with movement.  No other injuries.     Animal Bite    Past Medical History:  Diagnosis Date  . Abdominal pain, unspecified site 03/24/2009  . 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 ADHD 12/02/2009  . Allergic rhinitis, cause unspecified 01/21/2011  . ANXIETY 03/24/2009  . B12 DEFICIENCY 04/28/2009  . BURSITIS, RIGHT KNEE 02/05/2010  . Cellulitis and abscess of leg, except foot 02/05/2010  . Cervicalgia 12/02/2009  . COMMON MIGRAINE 02/05/2010  . [facility] INTESTINE 05/19/2009  . ECZEMA 05/20/2010  . Endometriosis 08/04/2011  . Fibromyalgia   . GERD 03/24/2009  . HEADACHE, CHRONIC 03/24/2009  . HYPERTENSION 03/24/2009  . Osteoarthritis   . OTITIS MEDIA, LEFT 08/12/2010  . SMOKER 12/02/2009  . Spine pain 01/21/2011   neck and thoracic spine  . VITAMIN B1 DEFICIENCY 09/21/2009  . Wheezing 08/12/2010    Patient Active Problem List   Diagnosis Date Noted  . 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 Abdominal pain 10/09/2013  . Rectal prolapse 10/09/2013  . Blurred vision, bilateral 03/28/2012  . Thoracic spine pain 03/28/2012  . Chronic narcotic dependence ([place]) 11/02/2011  . Abdominal pain, generalized 11/02/2011  . Hypokalemia 11/02/2011  . Radiculopathy 11/02/2011  . Bilateral shoulder pain 10/01/2011  . Cervical  radiculopathy 09/30/2011  . Pain, joint, multiple sites 09/30/2011  . Lumbar radicular pain 08/04/2011  . Chronic neck pain 08/04/2011  . Endometriosis 08/04/2011  . Allergic rhinitis, cause unspecified 01/21/2011  . Chronic back pain 01/21/2011  . B12 deficiency 12/13/2010  . Preventative health care 12/06/2010  . ECZEMA 05/20/2010  . COMMON MIGRAINE 02/05/2010  . ADHD 12/02/2009  . 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 [facility] INTESTINE 05/19/2009  . ANXIETY 03/24/2009  . HYPERTENSION 03/24/2009  . GERD 03/24/2009  . HEADACHE, CHRONIC 03/24/2009    Past Surgical History:  Procedure Laterality Date  . BREAST ENHANCEMENT SURGERY  2004  . ENDOMETRIAL ABLATION  01/2009   Thinks laproscopic with possible transvaginal    OB History    No data available       Home Medications    Prior to Admission medications   Medication Sig Start Date End Date Taking? Authorizing Provider  ALPRAZolam Duanne Moron) 1 MG tablet Take 1 tablet (1 mg total) by mouth 3 (three) times daily as needed for anxiety. 11/05/13   Susy Frizzle, MD  budesonide (ENTOCORT EC) 3 MG 24 hr capsule Take 3 capsules (9 mg total) by mouth daily. 10/30/13   Kelvin Cellar, MD  ciprofloxacin (CIPRO) 500 MG tablet Take 1 tablet (500 mg total) by mouth every 12 (twelve) hours. 04/18/16   Hope Bunnie Pion, NP  clindamycin (CLEOCIN) 300 MG capsule Take 1 capsule (300 mg total) by mouth every 6 (six) hours.  04/18/16   Hope Bunnie Pion, NP  cyclobenzaprine (FLEXERIL) 10 MG tablet Take 10 mg by mouth 3 (three) times daily as needed for muscle spasms.     Historical Provider, MD  fexofenadine (ALLEGRA) 180 MG tablet Take 1 tablet (180 mg total) by mouth daily. 03/28/12 11/05/13  Biagio Borg, MD  magnesium oxide (MAG-OX) 400 MG tablet Take 1 tablet (400 mg total) by mouth 2 (two) times daily. 11/07/13   Susy Frizzle, MD  metoprolol succinate (TOPROL-XL) 100 MG 24 hr tablet Take 1 tablet (100 mg total) by mouth daily. Take with or  immediately following a meal. 11/05/13   Susy Frizzle, MD  montelukast (SINGULAIR) 10 MG tablet Take 10 mg by mouth daily as needed (for shortness of breath).    Historical Provider, MD  Oxycodone HCl 10 MG TABS Take 1 tablet (10 mg total) by mouth every 4 (four) hours as needed. 11/05/13   Susy Frizzle, MD  pantoprazole (PROTONIX) 40 MG tablet Take 1 tablet (40 mg total) by mouth 2 (two) times daily. 11/05/13   Susy Frizzle, MD  polyethylene glycol [facility] / Floria Raveling) packet Take 34 g by mouth daily. 10/25/13   Wendall Papa, MD    Family History Family History  Problem Relation Age of Onset  . Cancer Mother     breast  . Clotting disorder Mother   . Cancer Maternal Grandmother     Stomach Cancer  . Cancer Maternal Grandfather     Esophageal Cancer    Social History Social History  Substance Use Topics  . Smoking status: Current Every Day Smoker    Packs/day: 0.50    Years: 14.00    Types: Cigarettes  . Smokeless tobacco: Never Used     Comment: 5 daily  . Alcohol use Yes     Comment: occasionally     Allergies   Penicillins and Doxycycline   Review of Systems Review of Systems   Physical Exam Updated Vital Signs BP (!) 157/104 (BP Location: Left Arm)   Pulse 96   Temp 98.4 F (36.9 C) (Oral)   Resp 16   Ht 5' 6"  (1.676 m)   Wt 54.4 kg   SpO2 98%   BMI 19.37 kg/m   Physical Exam  Constitutional: She appears well-developed and well-nourished.  Eyes: EOM are normal.  Neck: Neck supple.  Pulmonary/Chest: Effort normal.  Abdominal: Soft. There is no tenderness.  Musculoskeletal: Normal range of motion.   Left foot: mild edema to distal aspect of foot, 2 small puncture wounds (See skin exam). Limited ROM toes due to severe pain in 4th toe.  Neurological: She is alert and oriented to person, place, and time.  Skin: Skin is warm and dry. Capillary refill takes less than 2 seconds.  Left foot, dorsal aspect: 53m puncture wound over base of 3rd  metatarsal. No active bleeding. No foreign bodies visualized.    Fourth toe- 0.5cm puncture wound type laceration. (will wait on imaging and pain medication before further exploring wound)   Psychiatric: She has a normal mood and affect. Her behavior is normal.  Nursing note and vitals reviewed.  Skin:  After wound cleaned and re assessed puncture wound noted base of 3th MT, laceration of the dorsum of the 4th toe at the base and extends into the web space. There is ecchymosis noted to the 4th and 5th toes.   Psychiatric: She has a normal mood and affect. Her behavior is normal.  Nursing note  and vitals reviewed.    ED Treatments / Results  Labs (all labs ordered are listed, but only abnormal results are displayed) Labs Reviewed - No data to display  Radiology Dg Foot 2 Views Left  Result Date: 04/18/2016 CLINICAL DATA:  Dog bite wound at the base of the left fourth toe. EXAM: LEFT FOOT - 2 VIEW COMPARISON:  None. FINDINGS: The examination is limited by the lack of an oblique view. No fracture, dislocation or radiopaque foreign body seen. IMPRESSION: Limited two-view examination demonstrating no visible abnormality. Electronically Signed   By: Claudie Revering M.D.   On: 04/18/2016 19:08    Procedures Procedures  Wounds soaked in peroxide NSS and antibacterial soap Lidocaine 1% 3 ccs Wounds irrigated with 1,000 ccs of NSS  Discussed with the patient no sutures due to increased chance of infection.  Non stick sterile dressing.  Buddy tape Post op shoe Pain management.   Medications Ordered in ED Medications  clindamycin (CLEOCIN) capsule 300 mg (300 mg Oral Given 04/18/16 1842)  ciprofloxacin (CIPRO) tablet 500 mg (500 mg Oral Given 04/18/16 1841)  Tdap (BOOSTRIX) injection 0.5 mL (0.5 mLs Intramuscular Given 04/18/16 1842)  oxyCODONE-acetaminophen (PERCOCET/ROXICET) 5-325 MG per tablet 1 tablet (1 tablet Oral Given 04/18/16 1848)     Initial Impression / Assessment and Plan / ED  Course  I have reviewed the triage vital signs and the nursing notes.  Pertinent  imaging results that were available during my care of the patient were reviewed by me and considered in my medical decision making (see chart for details).  Clinical Course   Pain management Wound care   Final Clinical Impressions(s) / ED Diagnoses  35 y.o. female with dog bites to the left foot stable for d/c without foreign body noted on x-rays, no fractures or dislocation. Patient to return for wound check in 2 days or sooner if symptoms worsen. Will start antibiotic and pain medication. Patient is allergic to penicillin so will use Clindamycin and Cipro.   Final diagnoses:  Dog bite    New Prescriptions New Prescriptions   CIPROFLOXACIN (CIPRO) 500 MG TABLET    Take 1 tablet (500 mg total) by mouth every 12 (twelve) hours.   CLINDAMYCIN (CLEOCIN) 300 MG CAPSULE    Take 1 capsule (300 mg total) by mouth every 6 (six) hours.     [place], NP 04/19/16 2250    Merrily Pew, MD 04/19/16 [id number]

## 2016-04-18 NOTE — ED Triage Notes (Signed)
Pt. Stated, pt got bitten by  pit bull on the foot. Pt. Very upset crying and husband upset. Bite on left foot at the toe area

## 2016-04-18 NOTE — ED Notes (Signed)
Pt needed to use the bathroom. Pt offered bed pan and bedside commode. Pt refused. Wheeled pt to the bathroom.

## 2016-12-06 DIAGNOSIS — Z8711 Personal history of peptic ulcer disease: Secondary | ICD-10-CM

## 2016-12-06 DIAGNOSIS — Z8719 Personal history of other diseases of the digestive system: Secondary | ICD-10-CM

## 2016-12-06 HISTORY — DX: Personal history of other diseases of the digestive system: Z87.19

## 2016-12-06 HISTORY — DX: Personal history of peptic ulcer disease: Z87.11

## 2017-08-13 ENCOUNTER — Other Ambulatory Visit: Payer: Self-pay

## 2017-08-13 ENCOUNTER — Encounter (HOSPITAL_COMMUNITY): Payer: Self-pay

## 2017-08-13 IMAGING — DX DG HAND 2V*L*
2 series · 2 of 2 positions shown · non-contrast
Comparison: None.

CLINICAL DATA: Infection in hand, patients entire hand is swollen,
red, and oozing.

EXAM:
LEFT HAND - 2 VIEW

[hand ap]
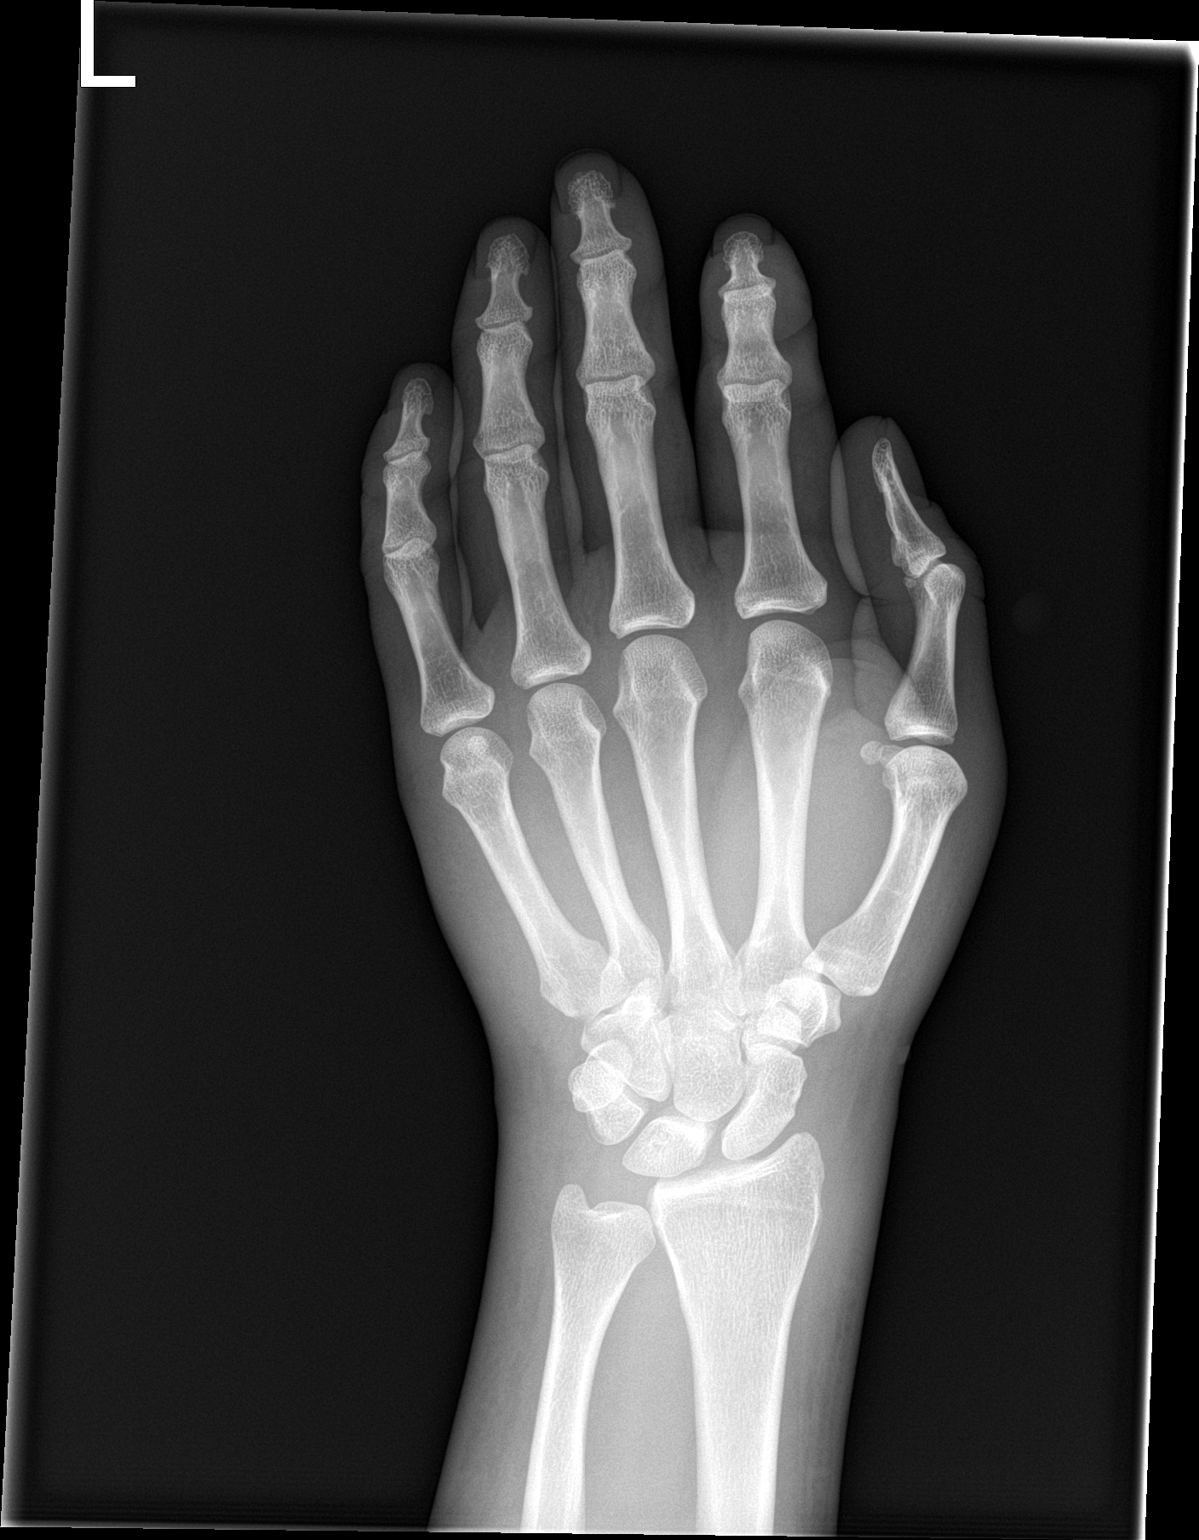

[hand lat]
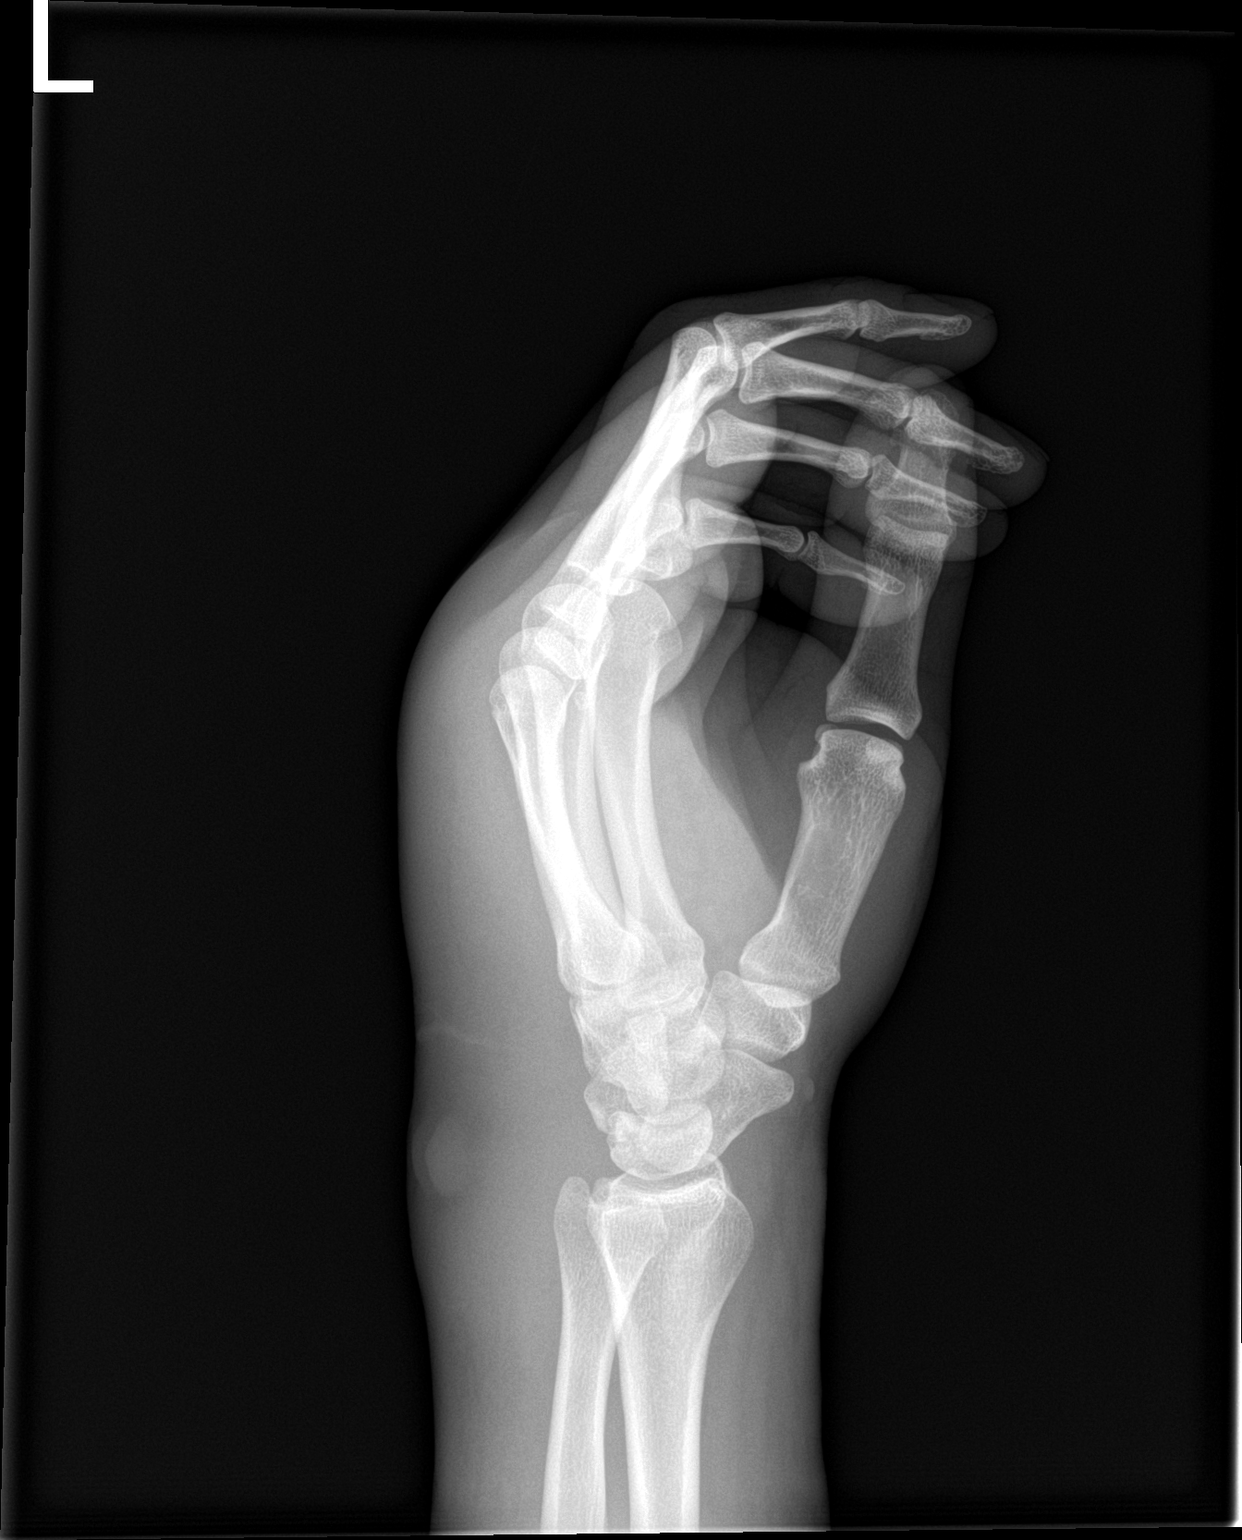

[2 of 2 positions shown; findings below may reference images not displayed]

FINDINGS: There is marked soft tissue swelling particularly along the dorsum
of the hand. No acute fracture or subluxation. No radiopaque foreign
body or soft tissue gas.
IMPRESSION: Significant soft tissue swelling without acute osseous injury.

## 2017-08-13 IMAGING — DX DG CHEST 1V PORT
1 series · 1 of 1 positions shown · non-contrast
Comparison: [DATE]

CLINICAL DATA: Pt found in a bathroom redness to lt hand with
swelling Code sepsis

EXAM:
PORTABLE CHEST 1 VIEW

[chest ap]
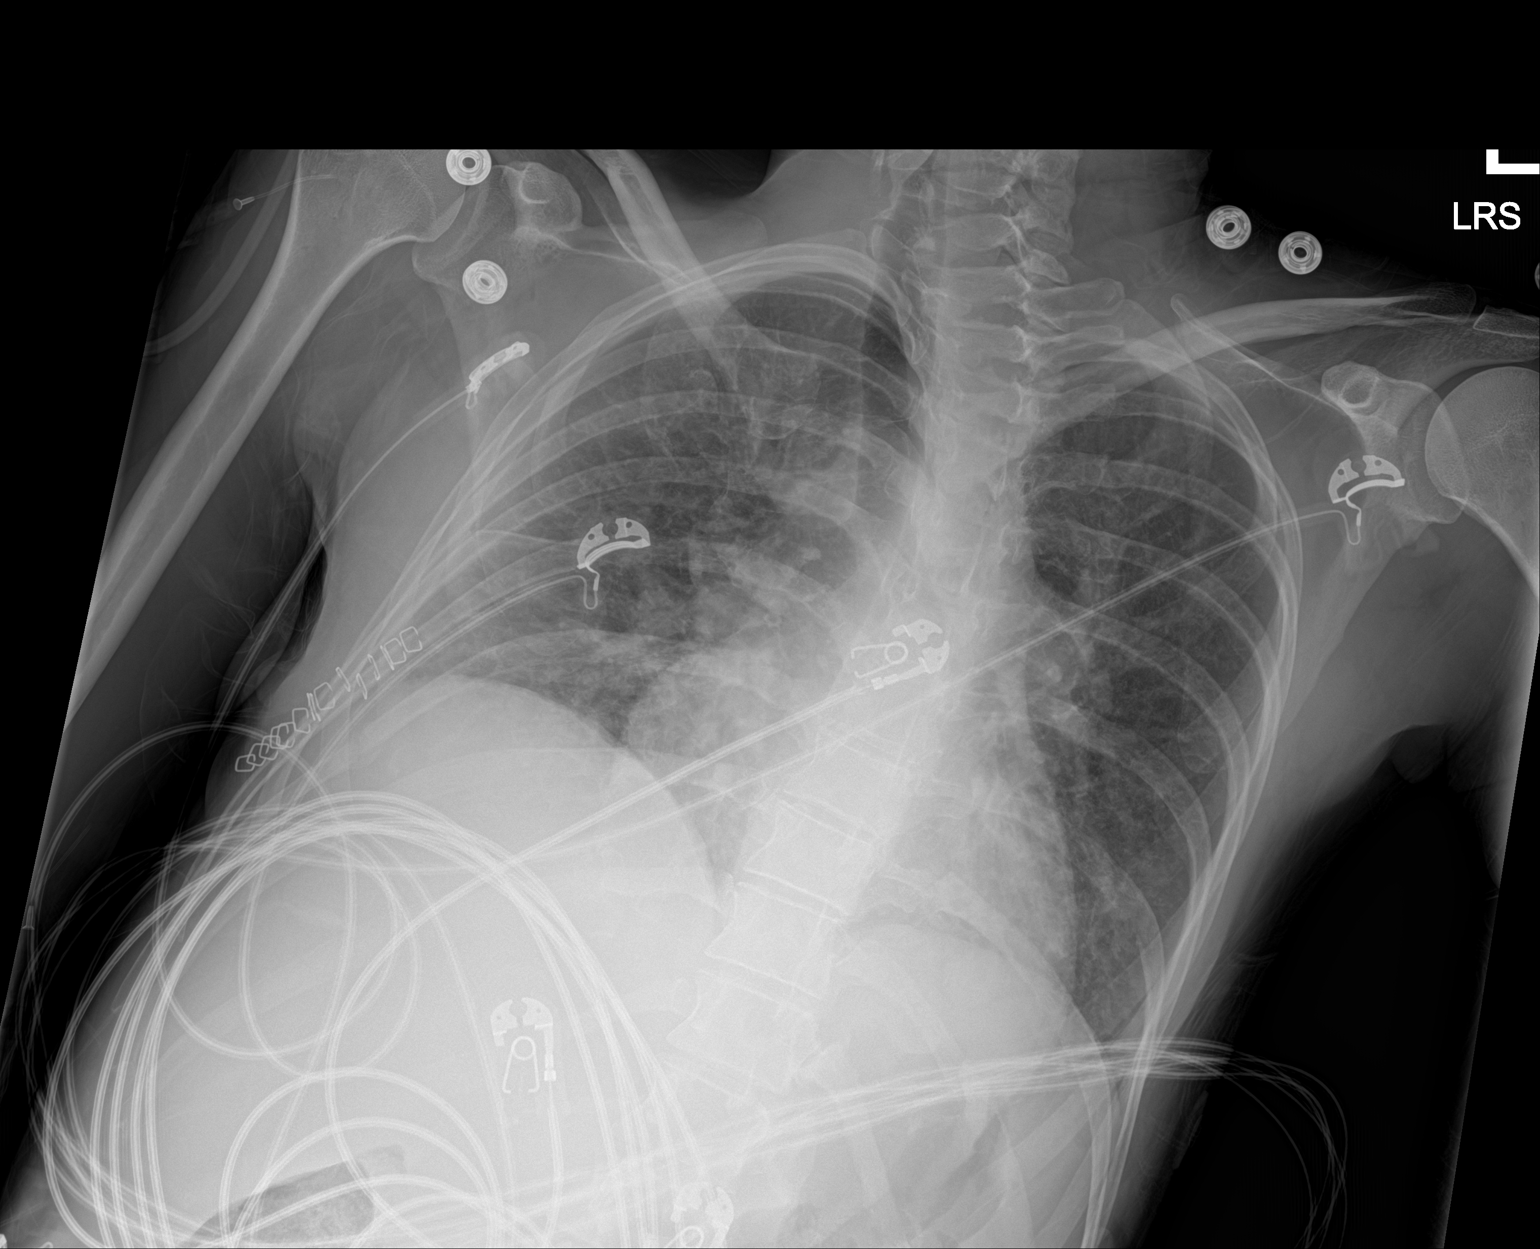

[1 of 1 positions shown; findings below may reference images not displayed]

FINDINGS: Heart size is mildly enlarged. There are patchy airspace filling
opacities bilaterally, most compatible with pulmonary edema although
multifocal pneumonia could have a similar appearance. There are no
pleural effusions. Surgical clips overlie the right lateral chest
wall.
IMPRESSION: Cardiomegaly and lung opacities favoring pulmonary edema a possibly
related to multifocal infection.

## 2017-08-14 SURGERY — Surgical Case
Anesthesia: *Unknown

## 2017-09-27 ENCOUNTER — Encounter (HOSPITAL_COMMUNITY): Payer: Self-pay | Admitting: Emergency Medicine

## 2017-09-27 ENCOUNTER — Emergency Department (HOSPITAL_COMMUNITY): Payer: Medicaid Other

## 2017-09-27 ENCOUNTER — Other Ambulatory Visit: Payer: Self-pay

## 2017-09-27 ENCOUNTER — Emergency Department (HOSPITAL_COMMUNITY)
Admission: EM | Admit: 2017-09-27 | Discharge: 2017-09-27 | Discharge status: Against Medical Advice | Payer: Medicaid Other | Attending: Emergency Medicine | Admitting: Emergency Medicine

## 2017-09-27 DIAGNOSIS — R109 Unspecified abdominal pain: Secondary | ICD-10-CM | POA: Diagnosis present

## 2017-09-27 DIAGNOSIS — Z5321 Procedure and treatment not carried out due to patient leaving prior to being seen by health care provider: Secondary | ICD-10-CM | POA: Diagnosis not present

## 2017-09-27 LAB — COMPREHENSIVE METABOLIC PANEL
ALT: 11 U/L — ABNORMAL LOW (ref 14–54)
AST: 15 U/L (ref 15–41)
Albumin: 3.3 g/dL — ABNORMAL LOW (ref 3.5–5.0)
Alkaline Phosphatase: 91 U/L (ref 38–126)
Anion gap: 10 (ref 5–15)
BUN: 12 mg/dL (ref 6–20)
CO2: 20 mmol/L — ABNORMAL LOW (ref 22–32)
Calcium: 8.1 mg/dL — ABNORMAL LOW (ref 8.9–10.3)
Chloride: 109 mmol/L (ref 101–111)
Creatinine, Ser: 0.92 mg/dL (ref 0.44–1.00)
GFR calc Af Amer: >60 mL/min (ref >60)
GFR calc non Af Amer: >60 mL/min (ref >60)
Glucose, Bld: 94 mg/dL (ref 65–99)
Potassium: 3.4 mmol/L — ABNORMAL LOW (ref 3.5–5.1)
Sodium: 139 mmol/L (ref 135–145)
Total Bilirubin: 0.8 mg/dL (ref 0.3–1.2)
Total Protein: 6 g/dL — ABNORMAL LOW (ref 6.5–8.1)

## 2017-09-27 LAB — URINALYSIS, ROUTINE W REFLEX MICROSCOPIC
Bilirubin Urine: NEGATIVE
Glucose, UA: NEGATIVE mg/dL
Hgb urine dipstick: NEGATIVE
Ketones, ur: NEGATIVE mg/dL
Leukocytes, UA: NEGATIVE
Nitrite: NEGATIVE
Protein, ur: NEGATIVE mg/dL
Specific Gravity, Urine: 1.016 (ref 1.005–1.030)
pH: 5 (ref 5.0–8.0)

## 2017-09-27 LAB — CBC
HCT: 40 % (ref 36.0–46.0)
Hemoglobin: 12.5 g/dL (ref 12.0–15.0)
MCH: 26.5 pg (ref 26.0–34.0)
MCHC: 31.3 g/dL (ref 30.0–36.0)
MCV: 84.7 fL (ref 78.0–100.0)
Platelets: 691 10*3/uL — ABNORMAL HIGH (ref 150–400)
RBC: 4.72 MIL/uL (ref 3.87–5.11)
RDW: 18.3 % — ABNORMAL HIGH (ref 11.5–15.5)
WBC: 20.8 10*3/uL — ABNORMAL HIGH (ref 4.0–10.5)

## 2017-09-27 LAB — LIPASE, BLOOD: Lipase: 23 U/L (ref 11–51)

## 2017-09-27 LAB — PREGNANCY, URINE: Preg Test, Ur: NEGATIVE

## 2017-09-27 MED ORDER — SODIUM CHLORIDE 0.9 % IV BOLUS (SEPSIS): 1000.0000 mL | Freq: Once | INTRAVENOUS | Status: DC

## 2017-09-27 MED ORDER — ONDANSETRON HCL 4 MG/2ML IJ SOLN: 4.0000 mg | Freq: Once | INTRAMUSCULAR | Status: DC

## 2017-09-27 MED ORDER — HYDROMORPHONE HCL 1 MG/ML IJ SOLN: 1.0000 mg | Freq: Once | INTRAMUSCULAR | Status: DC

## 2017-09-27 NOTE — ED Notes (Signed)
No answer in waiting area for treatment room.

## 2017-09-27 NOTE — ED Notes (Signed)
No answer in waiting area.

## 2017-09-27 NOTE — ED Triage Notes (Signed)
Pt presents to ED for assessment of right and left sided abdominal pain, denies changes in urination. C/o nausea and diarrhea.  Hx of Crohns.  Patient c/o ongoing symptoms x 1 week.    Dr. Ashok Cordia at bedside

## 2017-09-28 ENCOUNTER — Emergency Department (HOSPITAL_COMMUNITY)
Admission: EM | Admit: 2017-09-28 | Discharge: 2017-09-28 | Disposition: A | Discharge status: Home / Self Care | Payer: Medicaid Other | Attending: Emergency Medicine | Admitting: Emergency Medicine

## 2017-09-28 ENCOUNTER — Encounter (HOSPITAL_COMMUNITY): Payer: Self-pay

## 2017-09-28 ENCOUNTER — Emergency Department (HOSPITAL_COMMUNITY): Payer: Medicaid Other

## 2017-09-28 ENCOUNTER — Other Ambulatory Visit: Payer: Self-pay

## 2017-09-28 DIAGNOSIS — F419 Anxiety disorder, unspecified: Secondary | ICD-10-CM | POA: Insufficient documentation

## 2017-09-28 DIAGNOSIS — I10 Essential (primary) hypertension: Secondary | ICD-10-CM | POA: Insufficient documentation

## 2017-09-28 DIAGNOSIS — F909 Attention-deficit hyperactivity disorder, unspecified type: Secondary | ICD-10-CM | POA: Diagnosis not present

## 2017-09-28 DIAGNOSIS — R1084 Generalized abdominal pain: Secondary | ICD-10-CM | POA: Insufficient documentation

## 2017-09-28 DIAGNOSIS — F17210 Nicotine dependence, cigarettes, uncomplicated: Secondary | ICD-10-CM | POA: Insufficient documentation

## 2017-09-28 DIAGNOSIS — Z79899 Other long term (current) drug therapy: Secondary | ICD-10-CM | POA: Insufficient documentation

## 2017-09-28 IMAGING — CT CT ABD-PELV W/ CM
2 of 4 series · 16 of 46 positions shown, 18 images · IV contrast (APPLIED)
Comparison: [DATE]

CLINICAL DATA: Bilateral abdominal pain with nausea, vomiting and
diarrhea 7 days. History of Crohn's disease.

EXAM:
CT ABDOMEN AND PELVIS WITH CONTRAST
TECHNIQUE: Multidetector CT imaging of the abdomen and pelvis was performed
using the standard protocol following bolus administration of
intravenous contrast.
CONTRAST:  100mL [TA] IOPAMIDOL ([TA]) INJECTION 61%

[Series 3: abd/ pelvis 5.0 i30f 2 · axial · 0.72mm/px · z∈[+767,+1207]mm · 13 of 97 slices shown, 15 images]
[im 5/97  soft-tissue]
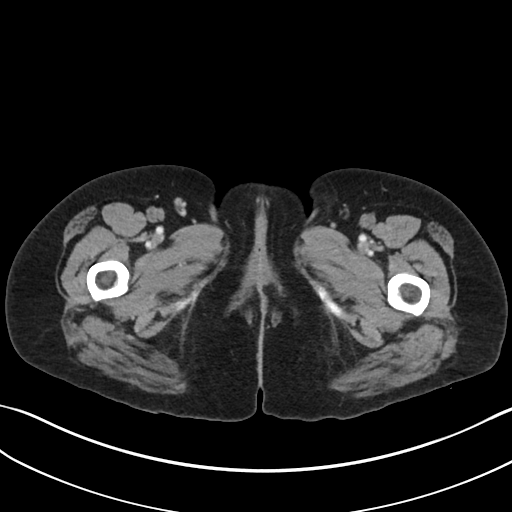
[im 5/97  bone]
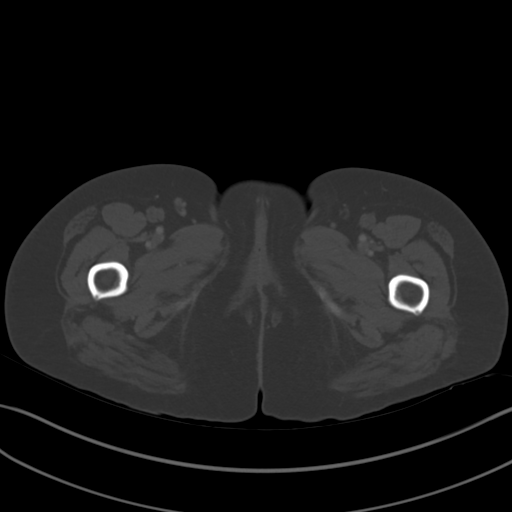
[im 13/97  soft-tissue]
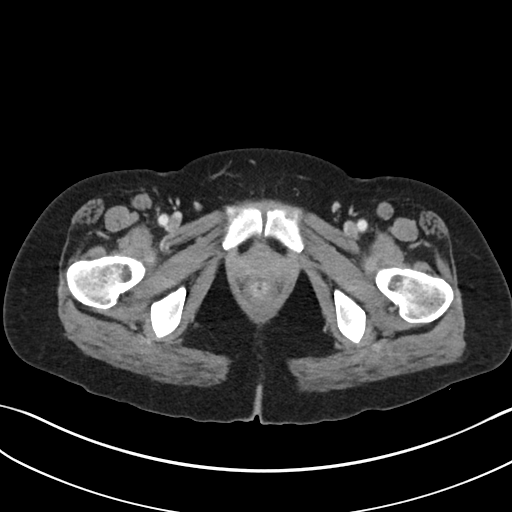
[im 21/97  soft-tissue]
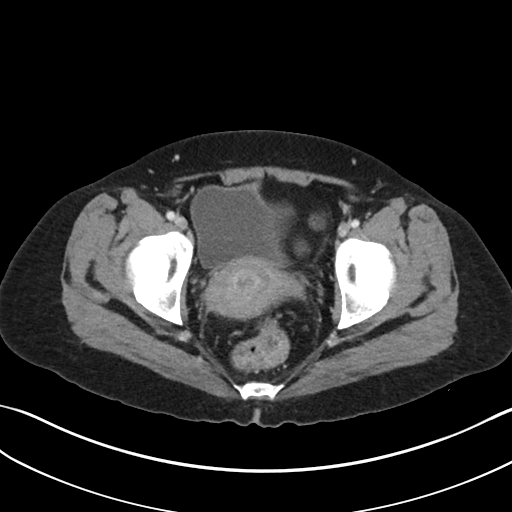
[im 29/97  soft-tissue]
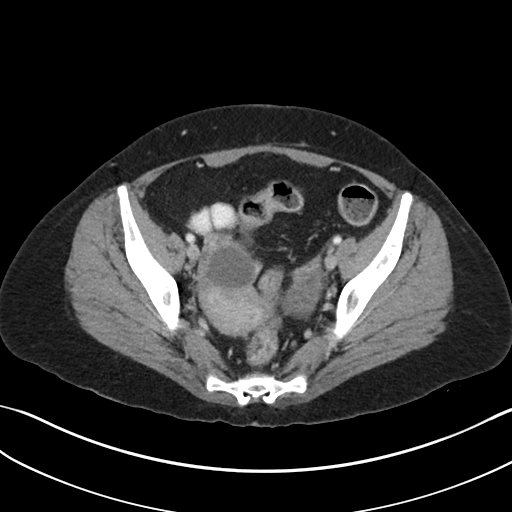
[im 33/97  soft-tissue]
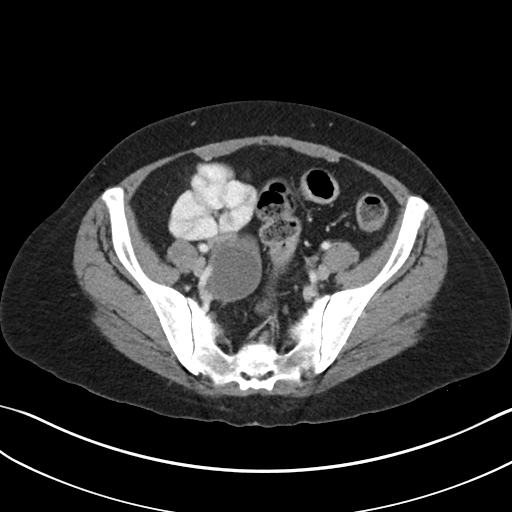
[im 41/97  soft-tissue]
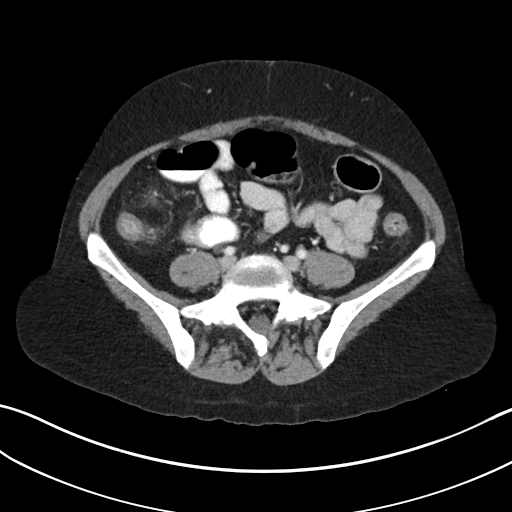
[im 49/97  soft-tissue]
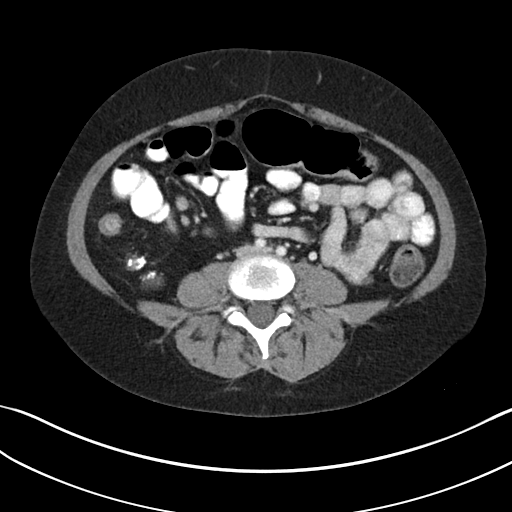
[im 57/97  soft-tissue]
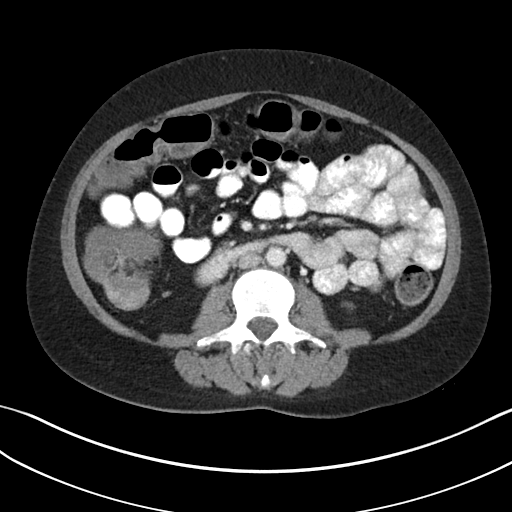
[im 65/97  soft-tissue]
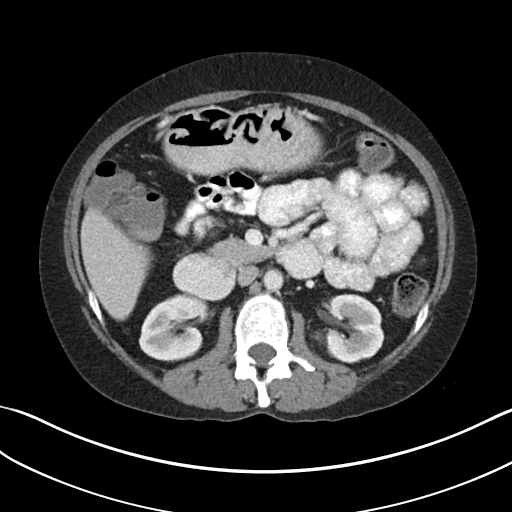
[im 65/97  bone]
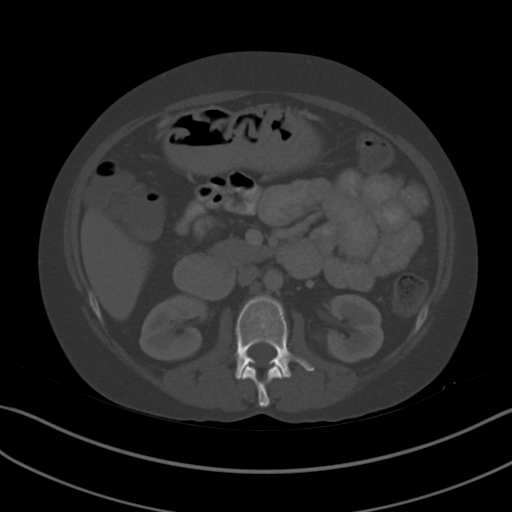
[im 69/97  soft-tissue]
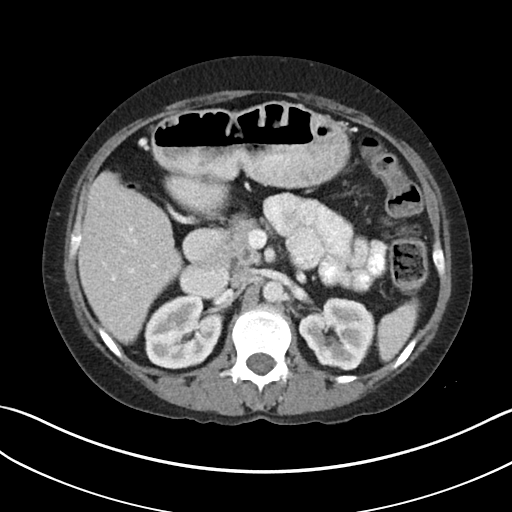
[im 77/97  soft-tissue]
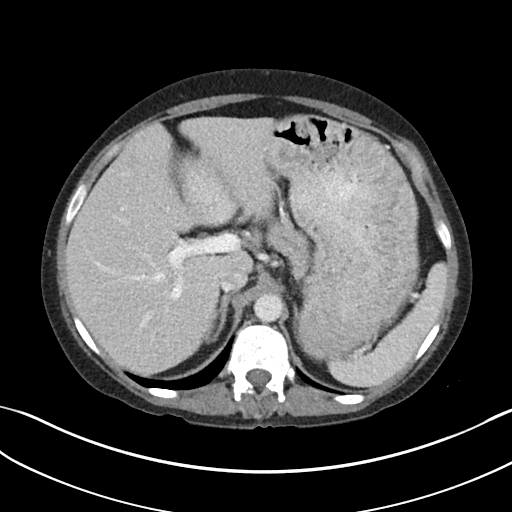
[im 85/97  soft-tissue]
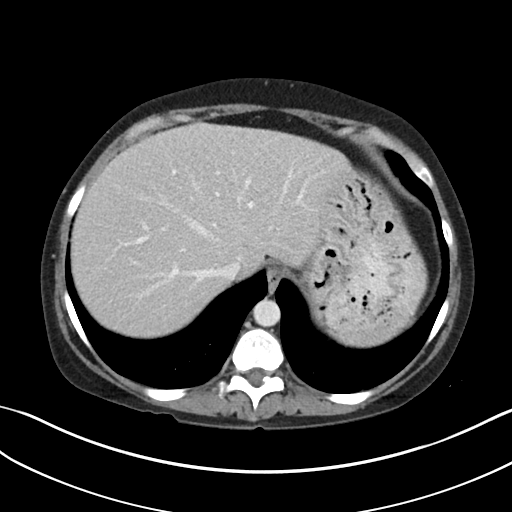
[im 93/97  soft-tissue]
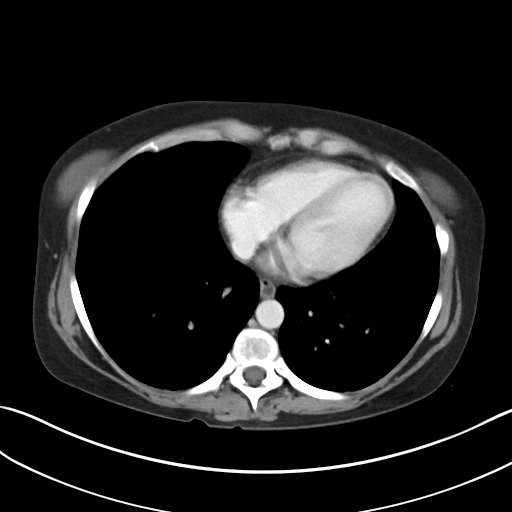

[Series 6: coronal soft tissue · coronal · 0.73mm/px · 3 of 98 slices shown]
[im 33/98  soft-tissue]
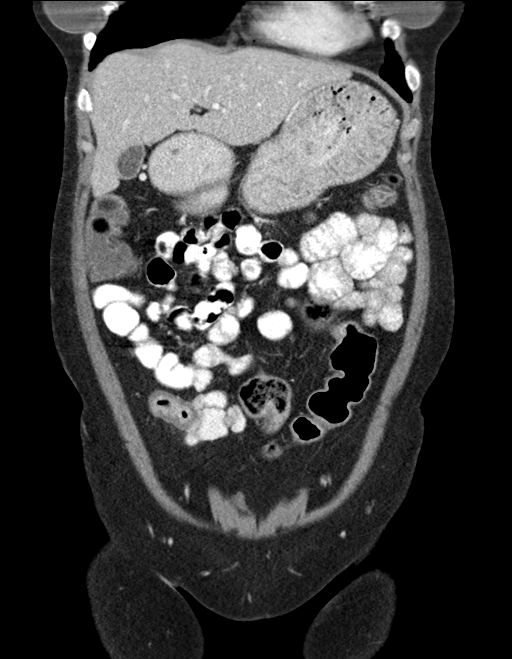
[im 44/98  soft-tissue]
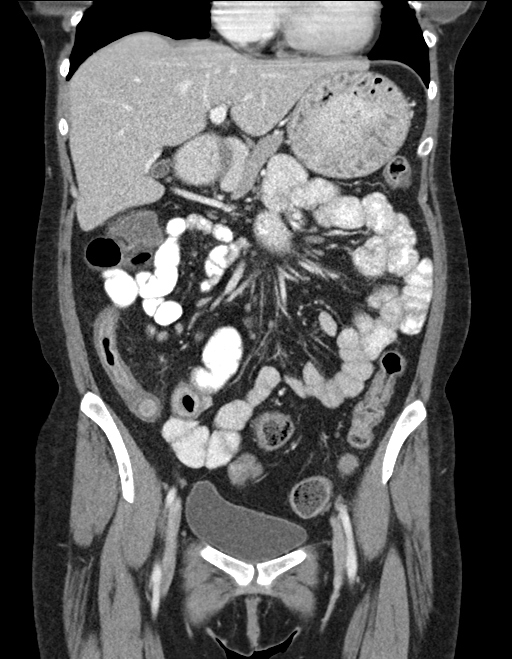
[im 54/98  soft-tissue]
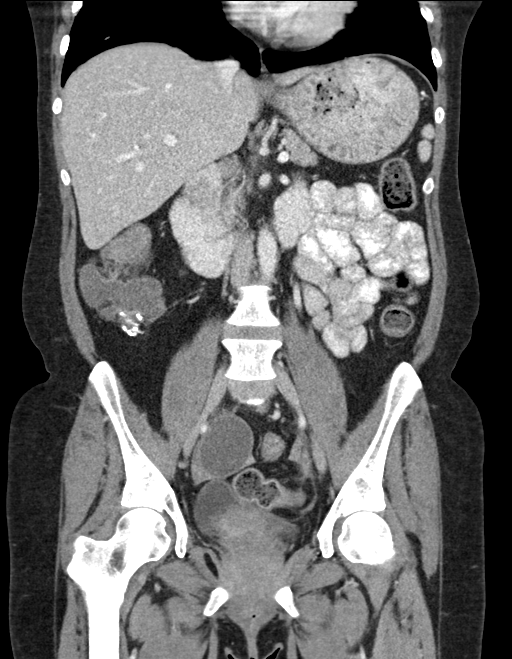

[16 of 46 positions shown; findings below may reference images not displayed]

FINDINGS: Lower chest: Normal lung bases.

Hepatobiliary: Normal.

Pancreas: Normal.

Spleen: Normal.

Adrenals/Urinary Tract: Adrenal glands are normal. Kidneys normal
size without hydronephrosis or nephrolithiasis. Ureters and bladder
are normal.

Stomach/Bowel: Mild gastric distension. There are surgical clips
over the cecum. Wall thickening of the terminal ileum without
significant adjacent inflammatory change or free fluid as findings
are compatible with known history of Crohn's disease. These findings
are slightly improved compared to the previous exam. Appendix not
visualized and likely has been resected. Colon is otherwise within
normal.

Vascular/Lymphatic: Vascular structures are within normal. There are
a few prominent right lower quadrant mesenteric lymph nodes without
significant change.

Reproductive: 3.9 cm right ovarian cyst and 3 cm left ovarian cyst.
Uterus is within normal.

Other: No significant free fluid or focal inflammatory change.

Musculoskeletal: Mild chronic anterior wedge compression deformity
of T12. Possible very early avascular necrosis of the femoral heads
unchanged.
IMPRESSION: Wall thickening of the terminal ileum with slight improvement
compared to the prior exam compatible with known history of Crohn's
disease. No adjacent inflammatory change or free fluid.

Bilateral ovarian cysts.

Postsurgical change over the cecum.

Stable chronic mild wedge compression fracture of T12.

Possible early bilateral avascular necrosis of the femoral heads
unchanged.

## 2017-09-28 MED ORDER — IOPAMIDOL (ISOVUE-300) INJECTION 61%
INTRAVENOUS | Status: AC
  Administered 2017-09-28: 100 mL
  Filled 2017-09-28: qty 100

## 2017-09-28 MED ORDER — ONDANSETRON HCL 4 MG/2ML IJ SOLN
4.0000 mg | Freq: Once | INTRAMUSCULAR | Status: AC
  Administered 2017-09-28: 4 mg via INTRAVENOUS
  Filled 2017-09-28: qty 2

## 2017-09-28 MED ORDER — ONDANSETRON 4 MG PO TBDP: 4.0000 mg | ORAL_TABLET | Freq: Three times a day (TID) | ORAL | 0 refills | Status: DC | PRN

## 2017-09-28 MED ORDER — OXYCODONE-ACETAMINOPHEN 5-325 MG PO TABS: 1.0000 | ORAL_TABLET | Freq: Four times a day (QID) | ORAL | 0 refills | Status: DC | PRN

## 2017-09-28 MED ORDER — IOPAMIDOL (ISOVUE-300) INJECTION 61%
INTRAVENOUS | Status: AC
  Filled 2017-09-28: qty 30

## 2017-09-28 MED ORDER — SODIUM CHLORIDE 0.9 % IV BOLUS (SEPSIS)
1000.0000 mL | Freq: Once | INTRAVENOUS | Status: AC
  Administered 2017-09-28: 1000 mL via INTRAVENOUS

## 2017-09-28 MED ORDER — MORPHINE SULFATE (PF) 4 MG/ML IV SOLN
4.0000 mg | Freq: Once | INTRAVENOUS | Status: AC
  Administered 2017-09-28: 4 mg via INTRAVENOUS
  Filled 2017-09-28: qty 1

## 2017-09-28 NOTE — ED Notes (Signed)
Pt drinking contrast. 

## 2017-09-28 NOTE — ED Triage Notes (Signed)
Pt endorses abd pain with diarrhea x 7 days. Pt was here yesterday but left due to wait time. Had [place] but did not get results. VSS. Afebrile.

## 2017-09-28 NOTE — ED Provider Notes (Addendum)
[place] EMERGENCY DEPARTMENT Provider Note   CSN: [id number] Arrival date & time: 09/28/17  1059     History   Chief Complaint Chief Complaint  Patient presents with  . Abdominal Pain    HPI [name] is a 37 y.o. female w PMHx Crohn disease, HTn, axiety, endometriosis, presenting to the ED with 1 week of generalized abdominal pain.  Patient states she has a history of Crohn's disease, and has chronic diarrhea as well as nausea.  She states pain feels different than a typical Crohn's flare, described as burning and throbbing.  She denies fever, chills, blood in her stool, urinary symptoms, pelvic complaints.  Has not taken any medications for pain or nausea at home.  Her gastroenterologist is at [facility], however she cannot recall the name.  She is not been evaluated in some time.  Reports past abdominal surgery of partial bowel resection. Patient checked in the ED yesterday, however left without being seen.  Labs were drawn with significant leukocytosis of 20.8, CMP and lipase are unremarkable.  UA and urine pregnant negative.  Abdominal ultrasound was also done prior to patient leaving, which is negative for acute pathology.   The history is provided by the patient.    Past Medical History:  Diagnosis Date  . Abdominal pain, unspecified site 03/24/2009  . 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 ADHD 12/02/2009  . Allergic rhinitis, cause unspecified 01/21/2011  . ANXIETY 03/24/2009  . B12 DEFICIENCY 04/28/2009  . BURSITIS, RIGHT KNEE 02/05/2010  . Cellulitis and abscess of leg, except foot 02/05/2010  . Cervicalgia 12/02/2009  . COMMON MIGRAINE 02/05/2010  . [facility] INTESTINE 05/19/2009  . ECZEMA 05/20/2010  . Endometriosis 08/04/2011  . Fibromyalgia   . GERD 03/24/2009  . HEADACHE, CHRONIC 03/24/2009  . HYPERTENSION 03/24/2009  . Osteoarthritis   . OTITIS MEDIA, LEFT 08/12/2010  . SMOKER 12/02/2009  . Spine pain 01/21/2011   neck and thoracic spine  . VITAMIN B1  DEFICIENCY 09/21/2009  . Wheezing 08/12/2010    Patient Active Problem List   Diagnosis Date Noted  . 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 Abdominal pain 10/09/2013  . Rectal prolapse 10/09/2013  . Blurred vision, bilateral 03/28/2012  . Thoracic spine pain 03/28/2012  . Chronic narcotic dependence ([place]) 11/02/2011  . Abdominal pain, generalized 11/02/2011  . Hypokalemia 11/02/2011  . Radiculopathy 11/02/2011  . Bilateral shoulder pain 10/01/2011  . Cervical radiculopathy 09/30/2011  . Pain, joint, multiple sites 09/30/2011  . Lumbar radicular pain 08/04/2011  . Chronic neck pain 08/04/2011  . Endometriosis 08/04/2011  . Allergic rhinitis, cause unspecified 01/21/2011  . Chronic back pain 01/21/2011  . B12 deficiency 12/13/2010  . Preventative health care 12/06/2010  . ECZEMA 05/20/2010  . COMMON MIGRAINE 02/05/2010  . ADHD 12/02/2009  . 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 [facility] INTESTINE 05/19/2009  . ANXIETY 03/24/2009  . HYPERTENSION 03/24/2009  . GERD 03/24/2009  . HEADACHE, CHRONIC 03/24/2009    Past Surgical History:  Procedure Laterality Date  . BREAST ENHANCEMENT SURGERY  2004  . ENDOMETRIAL ABLATION  01/2009   Thinks laproscopic with possible transvaginal    OB History    No data available       Home Medications    Prior to Admission medications   Medication Sig Start Date End Date Taking? Authorizing Provider  ALPRAZolam Duanne Moron) 1 MG tablet Take 1 tablet (1 mg total) by mouth 3 (three) times daily as needed for  anxiety. 11/05/13   Susy Frizzle, MD  budesonide (ENTOCORT EC) 3 MG 24 hr capsule Take 3 capsules (9 mg total) by mouth daily. 10/30/13   Kelvin Cellar, MD  ciprofloxacin (CIPRO) 500 MG tablet Take 1 tablet (500 mg total) by mouth every 12 (twelve) hours. 04/18/16   Ashley Murrain, NP  clindamycin (CLEOCIN) 300 MG capsule Take 1 capsule (300 mg total) by mouth every 6 (six) hours. 04/18/16    Ashley Murrain, NP  cyclobenzaprine (FLEXERIL) 10 MG tablet Take 10 mg by mouth 3 (three) times daily as needed for muscle spasms.     [provider]  fexofenadine (ALLEGRA) 180 MG tablet Take 1 tablet (180 mg total) by mouth daily. 03/28/12 11/05/13  Biagio Borg, MD  magnesium oxide (MAG-OX) 400 MG tablet Take 1 tablet (400 mg total) by mouth 2 (two) times daily. 11/07/13   Susy Frizzle, MD  metoprolol succinate (TOPROL-XL) 100 MG 24 hr tablet Take 1 tablet (100 mg total) by mouth daily. Take with or immediately following a meal. 11/05/13   Susy Frizzle, MD  montelukast (SINGULAIR) 10 MG tablet Take 10 mg by mouth daily as needed (for shortness of breath).    [provider]  ondansetron (ZOFRAN ODT) 4 MG disintegrating tablet Take 1 tablet (4 mg total) by mouth every 8 (eight) hours as needed for nausea or vomiting. 09/28/17   Robinson, [place] N, PA-C  oxyCODONE (ROXICODONE) 5 MG immediate release tablet Take 1 tablet (5 mg total) by mouth every 4 (four) hours as needed for severe pain. 04/18/16   Ashley Murrain, NP  oxyCODONE-acetaminophen (PERCOCET/ROXICET) 5-325 MG tablet Take 1 tablet by mouth every 6 (six) hours as needed for severe pain. 09/28/17   Robinson, [place] N, PA-C  pantoprazole (PROTONIX) 40 MG tablet Take 1 tablet (40 mg total) by mouth 2 (two) times daily. 11/05/13   Susy Frizzle, MD  polyethylene glycol (MIRALAX / Floria Raveling) packet Take 34 g by mouth daily. 10/25/13   Wendall Papa, MD  amLODipine (NORVASC) 10 MG tablet Take 1 tablet (10 mg total) by mouth daily. 09/30/11 11/01/11  Biagio Borg, MD  pregabalin (LYRICA) 50 MG capsule Take 1 capsule (50 mg total) by mouth 3 (three) times daily. 09/30/11 11/01/11  Biagio Borg, MD    Family History Family History  Problem Relation Age of Onset  . Cancer Mother        breast  . Clotting disorder Mother   . Cancer Maternal Grandmother        Stomach Cancer  . Cancer Maternal Grandfather        Esophageal  Cancer    Social History Social History   Tobacco Use  . Smoking status: Current Every Day Smoker    Packs/day: 0.50    Years: 14.00    Pack years: 7.00    Types: Cigarettes  . Smokeless tobacco: Never Used  . Tobacco comment: 5 daily  Substance Use Topics  . Alcohol use: Yes    Comment: occasionally  . Drug use: Yes    Types: IV     Allergies   Penicillins and Doxycycline   Review of Systems Review of Systems  Constitutional: Negative for chills and fever.  Gastrointestinal: Positive for abdominal pain, diarrhea, nausea and vomiting. Negative for blood in stool and constipation.  Genitourinary: Negative for dysuria, frequency, vaginal bleeding and vaginal discharge.  All other systems reviewed and are negative.    Physical Exam Updated  Vital Signs BP 123/85   Pulse 78   Temp 98.3 F (36.8 C) (Oral)   Resp 18   Ht 5' 3"  (1.6 m)   Wt 59 kg (130 lb)   LMP 09/21/2017 (Approximate)   SpO2 100%   BMI 23.03 kg/m   Physical Exam  Constitutional: She appears well-developed and well-nourished. No distress.  HENT:  Head: Normocephalic and atraumatic.  Mouth/Throat: Oropharynx is clear and moist.  Eyes: Conjunctivae are normal.  Cardiovascular: Normal rate, regular rhythm, normal heart sounds and intact distal pulses.  Pulmonary/Chest: Effort normal and breath sounds normal. No respiratory distress.  Abdominal: Soft. Normal appearance and bowel sounds are normal. She exhibits no distension and no mass. There is generalized tenderness. There is no rigidity, no rebound and no guarding.  Neurological: She is alert.  Skin: Skin is warm.  Psychiatric: She has a normal mood and affect. Her behavior is normal.  Nursing note and vitals reviewed.    ED Treatments / Results  Labs (all labs ordered are listed, but only abnormal results are displayed) Labs Reviewed - No data to display  EKG  EKG Interpretation None       Radiology US Abdomen Complete  Result  Date: 09/27/2017 CLINICAL DATA:  Upper abdominal pain. EXAM: ABDOMEN ULTRASOUND COMPLETE COMPARISON:  None. FINDINGS: Gallbladder: No gallstones or wall thickening visualized. No sonographic Murphy sign noted by sonographer. Common bile duct: Diameter: 3.5 mm which is within normal limits. Liver: No focal lesion identified. Within normal limits in parenchymal echogenicity. Portal vein is patent on color Doppler imaging with normal direction of blood flow towards the liver. IVC: No abnormality visualized. Pancreas: Visualized portion unremarkable. Spleen: Size and appearance within normal limits. Right Kidney: Length: 9 cm. Echogenicity within normal limits. No mass or hydronephrosis visualized. Left Kidney: Length: 8.5 cm. Echogenicity within normal limits. No mass or hydronephrosis visualized. Abdominal aorta: No aneurysm visualized. Other findings: None. IMPRESSION: No significant abnormality seen in the abdomen. Electronically Signed   By: Marijo Conception, M.D.   On: 09/27/2017 17:24   Ct Abdomen Pelvis W Contrast  Result Date: 09/28/2017 CLINICAL DATA:  Bilateral abdominal pain with nausea, vomiting and diarrhea 7 days. History of Crohn's disease. EXAM: CT ABDOMEN AND PELVIS WITH CONTRAST TECHNIQUE: Multidetector CT imaging of the abdomen and pelvis was performed using the standard protocol following bolus administration of intravenous contrast. CONTRAST:  148m ISOVUE-300 IOPAMIDOL (ISOVUE-300) INJECTION 61% COMPARISON:  10/09/2013 FINDINGS: Lower chest: Normal lung bases. Hepatobiliary: Normal. Pancreas: Normal. Spleen: Normal. Adrenals/Urinary Tract: Adrenal glands are normal. Kidneys normal size without hydronephrosis or nephrolithiasis. Ureters and bladder are normal. Stomach/Bowel: Mild gastric distension. There are surgical clips over the cecum. Wall thickening of the terminal ileum without significant adjacent inflammatory change or free fluid as findings are compatible with known history of Crohn's  disease. These findings are slightly improved compared to the previous exam. Appendix not visualized and likely has been resected. Colon is otherwise within normal. Vascular/Lymphatic: Vascular structures are within normal. There are a few prominent right lower quadrant mesenteric lymph nodes without significant change. Reproductive: 3.9 cm right ovarian cyst and 3 cm left ovarian cyst. Uterus is within normal. Other: No significant free fluid or focal inflammatory change. Musculoskeletal: Mild chronic anterior wedge compression deformity of T12. 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 IMPRESSION: Wall thickening of the terminal ileum with slight improvement compared to the prior exam compatible with known history of Crohn's disease. No adjacent inflammatory change or free  fluid. Bilateral ovarian cysts. Postsurgical change over the cecum. Stable chronic mild wedge compression fracture of T12. 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 Electronically Signed   By: Marin Olp M.D.   On: 09/28/2017 19:51    Procedures Procedures (including critical care time)  Medications Ordered in ED Medications  iopamidol (ISOVUE-300) 61 % injection (not administered)  sodium chloride 0.9 % bolus 1,000 mL (1,000 mLs Intravenous New Bag/Given 09/28/17 1709)  ondansetron (ZOFRAN) injection 4 mg (4 mg Intravenous Given 09/28/17 1710)  morphine 4 MG/ML injection 4 mg (4 mg Intravenous Given 09/28/17 1710)  iopamidol (ISOVUE-300) 61 % injection (100 mLs  Contrast Given 09/28/17 1931)     Initial Impression / Assessment and Plan / ED Course  I have reviewed the triage vital signs and the nursing notes.  Pertinent labs & imaging results that were available during my care of the patient were reviewed by me and considered in my medical decision making (see chart for details).     Pt w generalized abdominal pain. Patient is nontoxic, nonseptic appearing, in  no apparent distress.  Patient's pain and other symptoms adequately managed in emergency department.  Fluid bolus given.  Labs, imaging and vitals reviewed.  CT neg for acute abdominal pathology; discussed incidental findings of bilateral ovarian cysts. Patient does not meet the SIRS or Sepsis criteria.  On repeat exam patient does not have a surgical abdomen and there are no peritoneal signs.  No indication of appendicitis, bowel obstruction, bowel perforation, cholecystitis, diverticulitis, PID or ectopic pregnancy.  Patient discharged home with symptomatic treatment and given strict instructions for follow-up with their primary care physician.  Pt safe for discharge.  [place] Controlled Substance reporting System queried  Patient discussed with Dr. Eulis Foster.  Discussed results, findings, treatment and follow up. Patient advised of return precautions. Patient verbalized understanding and agreed with plan.   Final Clinical Impressions(s) / ED Diagnoses   Final diagnoses:  Generalized abdominal pain    ED Discharge Orders        Ordered    ondansetron (ZOFRAN ODT) 4 MG disintegrating tablet  Every 8 hours PRN     09/28/17 2114    oxyCODONE-acetaminophen (PERCOCET/ROXICET) 5-325 MG tablet  Every 6 hours PRN     09/28/17 2114       Robinson, [place] N, PA-C 09/28/17 2117    Robinson, [place] N, PA-C 09/28/17 2118    Daleen Bo, MD 09/29/17 1143

## 2017-09-28 NOTE — Discharge Instructions (Signed)
Schedule an appointment with your gynecologist to discuss your CT findings today of ovarian cysts. Your abdominal pains today could be from your endometriosis as well. You can take zofran as needed for nausea.

## 2017-09-28 NOTE — ED Notes (Signed)
Pt to CT at this time.

## 2017-09-28 NOTE — ED Notes (Signed)
Pt still in CT

## 2017-09-28 NOTE — ED Notes (Signed)
Patient transported to CT 

## 2017-12-06 ENCOUNTER — Inpatient Hospital Stay (HOSPITAL_COMMUNITY)
Admission: EM | Admit: 2017-12-06 | Discharge: 2017-12-20 | DRG: 870 | Disposition: A | Discharge status: Home Health | Payer: Medicaid Other | Attending: Internal Medicine | Admitting: Internal Medicine

## 2017-12-06 ENCOUNTER — Emergency Department (HOSPITAL_COMMUNITY): Payer: Medicaid Other

## 2017-12-06 ENCOUNTER — Encounter (HOSPITAL_COMMUNITY): Payer: Self-pay | Admitting: Emergency Medicine

## 2017-12-06 DIAGNOSIS — R109 Unspecified abdominal pain: Secondary | ICD-10-CM | POA: Diagnosis not present

## 2017-12-06 DIAGNOSIS — F1010 Alcohol abuse, uncomplicated: Secondary | ICD-10-CM

## 2017-12-06 DIAGNOSIS — N179 Acute kidney failure, unspecified: Secondary | ICD-10-CM

## 2017-12-06 DIAGNOSIS — D473 Essential (hemorrhagic) thrombocythemia: Secondary | ICD-10-CM

## 2017-12-06 DIAGNOSIS — Z803 Family history of malignant neoplasm of breast: Secondary | ICD-10-CM

## 2017-12-06 DIAGNOSIS — K5080 Crohn's disease of both small and large intestine without complications: Secondary | ICD-10-CM | POA: Diagnosis present

## 2017-12-06 DIAGNOSIS — E870 Hyperosmolality and hypernatremia: Secondary | ICD-10-CM | POA: Diagnosis not present

## 2017-12-06 DIAGNOSIS — R739 Hyperglycemia, unspecified: Secondary | ICD-10-CM | POA: Diagnosis present

## 2017-12-06 DIAGNOSIS — E538 Deficiency of other specified B group vitamins: Secondary | ICD-10-CM

## 2017-12-06 DIAGNOSIS — B9789 Other viral agents as the cause of diseases classified elsewhere: Secondary | ICD-10-CM | POA: Diagnosis present

## 2017-12-06 DIAGNOSIS — G6281 Critical illness polyneuropathy: Secondary | ICD-10-CM

## 2017-12-06 DIAGNOSIS — Y92009 Unspecified place in unspecified non-institutional (private) residence as the place of occurrence of the external cause: Secondary | ICD-10-CM

## 2017-12-06 DIAGNOSIS — H70899 Other mastoiditis and related conditions, unspecified ear: Secondary | ICD-10-CM | POA: Diagnosis present

## 2017-12-06 DIAGNOSIS — E876 Hypokalemia: Secondary | ICD-10-CM | POA: Diagnosis not present

## 2017-12-06 DIAGNOSIS — F909 Attention-deficit hyperactivity disorder, unspecified type: Secondary | ICD-10-CM

## 2017-12-06 DIAGNOSIS — G9341 Metabolic encephalopathy: Secondary | ICD-10-CM | POA: Diagnosis present

## 2017-12-06 DIAGNOSIS — Z9289 Personal history of other medical treatment: Secondary | ICD-10-CM

## 2017-12-06 DIAGNOSIS — F17210 Nicotine dependence, cigarettes, uncomplicated: Secondary | ICD-10-CM | POA: Diagnosis present

## 2017-12-06 DIAGNOSIS — F411 Generalized anxiety disorder: Secondary | ICD-10-CM | POA: Diagnosis present

## 2017-12-06 DIAGNOSIS — Z4659 Encounter for fitting and adjustment of other gastrointestinal appliance and device: Secondary | ICD-10-CM

## 2017-12-06 DIAGNOSIS — M479 Spondylosis, unspecified: Secondary | ICD-10-CM | POA: Diagnosis present

## 2017-12-06 DIAGNOSIS — I10 Essential (primary) hypertension: Secondary | ICD-10-CM | POA: Diagnosis not present

## 2017-12-06 DIAGNOSIS — D638 Anemia in other chronic diseases classified elsewhere: Secondary | ICD-10-CM | POA: Diagnosis present

## 2017-12-06 DIAGNOSIS — D62 Acute posthemorrhagic anemia: Secondary | ICD-10-CM

## 2017-12-06 DIAGNOSIS — Z0189 Encounter for other specified special examinations: Secondary | ICD-10-CM

## 2017-12-06 DIAGNOSIS — K50919 Crohn's disease, unspecified, with unspecified complications: Secondary | ICD-10-CM

## 2017-12-06 DIAGNOSIS — K5000 Crohn's disease of small intestine without complications: Secondary | ICD-10-CM | POA: Diagnosis present

## 2017-12-06 DIAGNOSIS — D649 Anemia, unspecified: Secondary | ICD-10-CM

## 2017-12-06 DIAGNOSIS — D72829 Elevated white blood cell count, unspecified: Secondary | ICD-10-CM

## 2017-12-06 DIAGNOSIS — R0602 Shortness of breath: Secondary | ICD-10-CM

## 2017-12-06 DIAGNOSIS — H6693 Otitis media, unspecified, bilateral: Secondary | ICD-10-CM | POA: Diagnosis present

## 2017-12-06 DIAGNOSIS — K5900 Constipation, unspecified: Secondary | ICD-10-CM

## 2017-12-06 DIAGNOSIS — Z452 Encounter for adjustment and management of vascular access device: Secondary | ICD-10-CM

## 2017-12-06 DIAGNOSIS — F10239 Alcohol dependence with withdrawal, unspecified: Secondary | ICD-10-CM | POA: Diagnosis not present

## 2017-12-06 DIAGNOSIS — J690 Pneumonitis due to inhalation of food and vomit: Secondary | ICD-10-CM | POA: Diagnosis present

## 2017-12-06 DIAGNOSIS — Z01818 Encounter for other preprocedural examination: Secondary | ICD-10-CM

## 2017-12-06 DIAGNOSIS — F1120 Opioid dependence, uncomplicated: Secondary | ICD-10-CM | POA: Diagnosis present

## 2017-12-06 DIAGNOSIS — J0140 Acute pansinusitis, unspecified: Secondary | ICD-10-CM | POA: Diagnosis present

## 2017-12-06 DIAGNOSIS — H9190 Unspecified hearing loss, unspecified ear: Secondary | ICD-10-CM | POA: Diagnosis present

## 2017-12-06 DIAGNOSIS — K529 Noninfective gastroenteritis and colitis, unspecified: Secondary | ICD-10-CM | POA: Diagnosis present

## 2017-12-06 DIAGNOSIS — J019 Acute sinusitis, unspecified: Secondary | ICD-10-CM | POA: Diagnosis present

## 2017-12-06 DIAGNOSIS — J40 Bronchitis, not specified as acute or chronic: Secondary | ICD-10-CM | POA: Diagnosis not present

## 2017-12-06 DIAGNOSIS — R000 Tachycardia, unspecified: Secondary | ICD-10-CM

## 2017-12-06 DIAGNOSIS — R1084 Generalized abdominal pain: Secondary | ICD-10-CM | POA: Diagnosis present

## 2017-12-06 DIAGNOSIS — E860 Dehydration: Secondary | ICD-10-CM | POA: Diagnosis present

## 2017-12-06 DIAGNOSIS — T503X6A Underdosing of electrolytic, caloric and water-balance agents, initial encounter: Secondary | ICD-10-CM | POA: Diagnosis present

## 2017-12-06 DIAGNOSIS — E872 Acidosis: Secondary | ICD-10-CM | POA: Diagnosis present

## 2017-12-06 DIAGNOSIS — J9601 Acute respiratory failure with hypoxia: Secondary | ICD-10-CM

## 2017-12-06 DIAGNOSIS — E874 Mixed disorder of acid-base balance: Secondary | ICD-10-CM | POA: Diagnosis present

## 2017-12-06 DIAGNOSIS — Z79899 Other long term (current) drug therapy: Secondary | ICD-10-CM

## 2017-12-06 DIAGNOSIS — J45909 Unspecified asthma, uncomplicated: Secondary | ICD-10-CM

## 2017-12-06 DIAGNOSIS — A419 Sepsis, unspecified organism: Principal | ICD-10-CM | POA: Diagnosis present

## 2017-12-06 DIAGNOSIS — K567 Ileus, unspecified: Secondary | ICD-10-CM

## 2017-12-06 DIAGNOSIS — Z880 Allergy status to penicillin: Secondary | ICD-10-CM

## 2017-12-06 DIAGNOSIS — K219 Gastro-esophageal reflux disease without esophagitis: Secondary | ICD-10-CM | POA: Diagnosis present

## 2017-12-06 DIAGNOSIS — D75839 Thrombocytosis, unspecified: Secondary | ICD-10-CM

## 2017-12-06 DIAGNOSIS — M797 Fibromyalgia: Secondary | ICD-10-CM

## 2017-12-06 DIAGNOSIS — F1910 Other psychoactive substance abuse, uncomplicated: Secondary | ICD-10-CM

## 2017-12-06 DIAGNOSIS — J069 Acute upper respiratory infection, unspecified: Secondary | ICD-10-CM

## 2017-12-06 DIAGNOSIS — Z720 Tobacco use: Secondary | ICD-10-CM

## 2017-12-06 DIAGNOSIS — Z91128 Patient's intentional underdosing of medication regimen for other reason: Secondary | ICD-10-CM

## 2017-12-06 DIAGNOSIS — T50996A Underdosing of other drugs, medicaments and biological substances, initial encounter: Secondary | ICD-10-CM | POA: Diagnosis present

## 2017-12-06 DIAGNOSIS — J80 Acute respiratory distress syndrome: Secondary | ICD-10-CM

## 2017-12-06 DIAGNOSIS — Z800 Family history of malignant neoplasm of digestive organs: Secondary | ICD-10-CM

## 2017-12-06 DIAGNOSIS — F419 Anxiety disorder, unspecified: Secondary | ICD-10-CM | POA: Diagnosis present

## 2017-12-06 DIAGNOSIS — R0682 Tachypnea, not elsewhere classified: Secondary | ICD-10-CM

## 2017-12-06 DIAGNOSIS — Z881 Allergy status to other antibiotic agents status: Secondary | ICD-10-CM

## 2017-12-06 HISTORY — DX: Bronchitis, not specified as acute or chronic: J40

## 2017-12-06 LAB — COMPREHENSIVE METABOLIC PANEL
ALT: 8 U/L — ABNORMAL LOW (ref 14–54)
AST: 14 U/L — ABNORMAL LOW (ref 15–41)
Albumin: 2.2 g/dL — ABNORMAL LOW (ref 3.5–5.0)
Alkaline Phosphatase: 155 U/L — ABNORMAL HIGH (ref 38–126)
Anion gap: 14 (ref 5–15)
BUN: 13 mg/dL (ref 6–20)
CO2: 14 mmol/L — ABNORMAL LOW (ref 22–32)
Calcium: 5 mg/dL — CL (ref 8.9–10.3)
Chloride: 112 mmol/L — ABNORMAL HIGH (ref 101–111)
Creatinine, Ser: 1.94 mg/dL — ABNORMAL HIGH (ref 0.44–1.00)
GFR calc Af Amer: 37 mL/min — ABNORMAL LOW (ref >60)
GFR calc non Af Amer: 32 mL/min — ABNORMAL LOW (ref >60)
Glucose, Bld: 108 mg/dL — ABNORMAL HIGH (ref 65–99)
Potassium: 2 mmol/L — CL (ref 3.5–5.1)
Sodium: 140 mmol/L (ref 135–145)
Total Bilirubin: 1 mg/dL (ref 0.3–1.2)
Total Protein: 5.8 g/dL — ABNORMAL LOW (ref 6.5–8.1)

## 2017-12-06 LAB — I-STAT CG4 LACTIC ACID, ED: Lactic Acid, Venous: 1.56 mmol/L (ref 0.5–1.9)

## 2017-12-06 LAB — CBC WITH DIFFERENTIAL/PLATELET
Basophils Absolute: 0 10*3/uL (ref 0.0–0.1)
Basophils Relative: 0 %
Eosinophils Absolute: 0 10*3/uL (ref 0.0–0.7)
Eosinophils Relative: 0 %
HCT: 33.4 % — ABNORMAL LOW (ref 36.0–46.0)
Hemoglobin: 10.9 g/dL — ABNORMAL LOW (ref 12.0–15.0)
Lymphocytes Relative: 8 %
Lymphs Abs: 2.9 10*3/uL (ref 0.7–4.0)
MCH: 25.1 pg — ABNORMAL LOW (ref 26.0–34.0)
MCHC: 32.6 g/dL (ref 30.0–36.0)
MCV: 76.8 fL — ABNORMAL LOW (ref 78.0–100.0)
Monocytes Absolute: 2.2 10*3/uL — ABNORMAL HIGH (ref 0.1–1.0)
Monocytes Relative: 6 %
Neutro Abs: 31.1 10*3/uL — ABNORMAL HIGH (ref 1.7–7.7)
Neutrophils Relative %: 86 %
Platelets: 491 10*3/uL — ABNORMAL HIGH (ref 150–400)
RBC: 4.35 MIL/uL (ref 3.87–5.11)
RDW: 17.5 % — ABNORMAL HIGH (ref 11.5–15.5)
WBC: 36.2 10*3/uL — ABNORMAL HIGH (ref 4.0–10.5)

## 2017-12-06 LAB — LIPASE, BLOOD: Lipase: 18 U/L (ref 11–51)

## 2017-12-06 LAB — INFLUENZA PANEL BY PCR (TYPE A & B)
Influenza A By PCR: NEGATIVE
Influenza B By PCR: NEGATIVE

## 2017-12-06 LAB — I-STAT BETA HCG BLOOD, ED (MC, WL, AP ONLY): I-stat hCG, quantitative: 46.3 m[IU]/mL — ABNORMAL HIGH (ref <5)

## 2017-12-06 LAB — MAGNESIUM: Magnesium: 0.4 mg/dL — CL (ref 1.7–2.4)

## 2017-12-06 LAB — HCG, QUANTITATIVE, PREGNANCY: hCG, Beta Chain, Quant, S: 3 m[IU]/mL (ref <5)

## 2017-12-06 IMAGING — CR DG CHEST 2V
2 series · 2 of 2 positions shown · non-contrast
Comparison: [DATE]

CLINICAL DATA: Cough, shortness of breath, chest pain

EXAM:
CHEST - 2 VIEW

[chest lat]
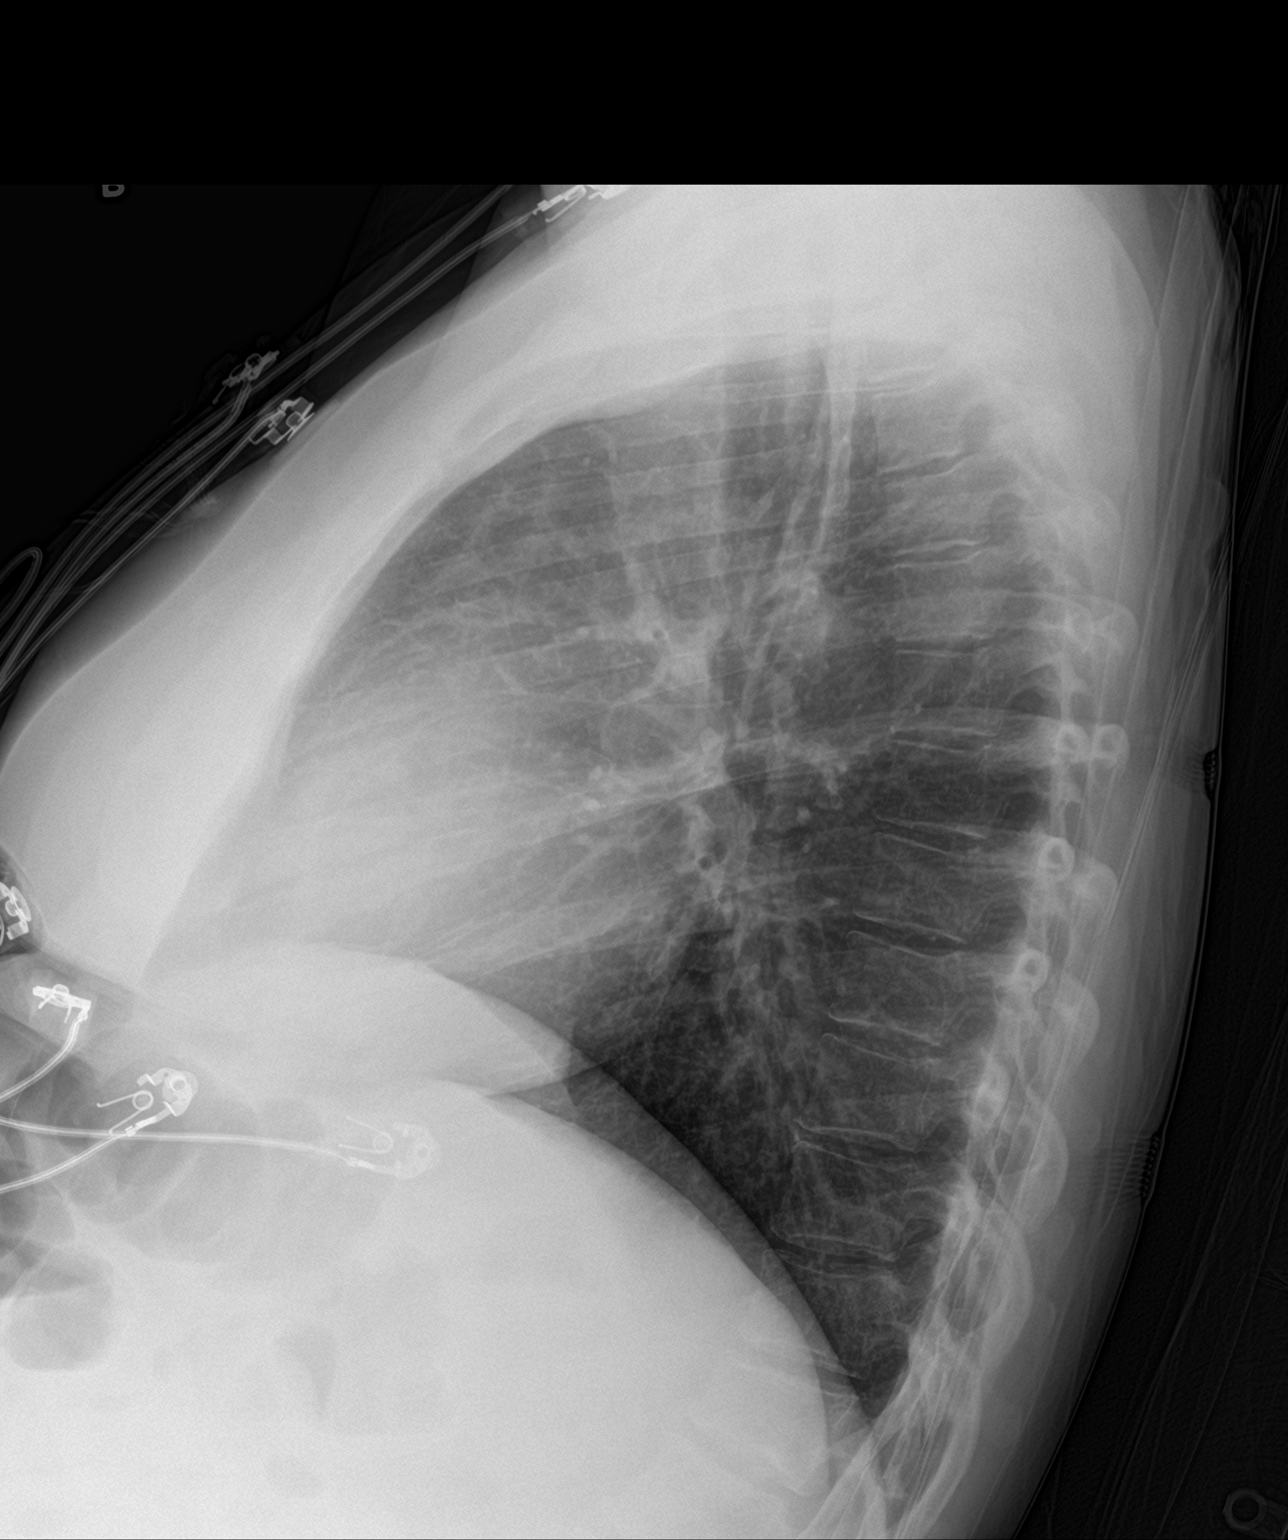

[chest ap]
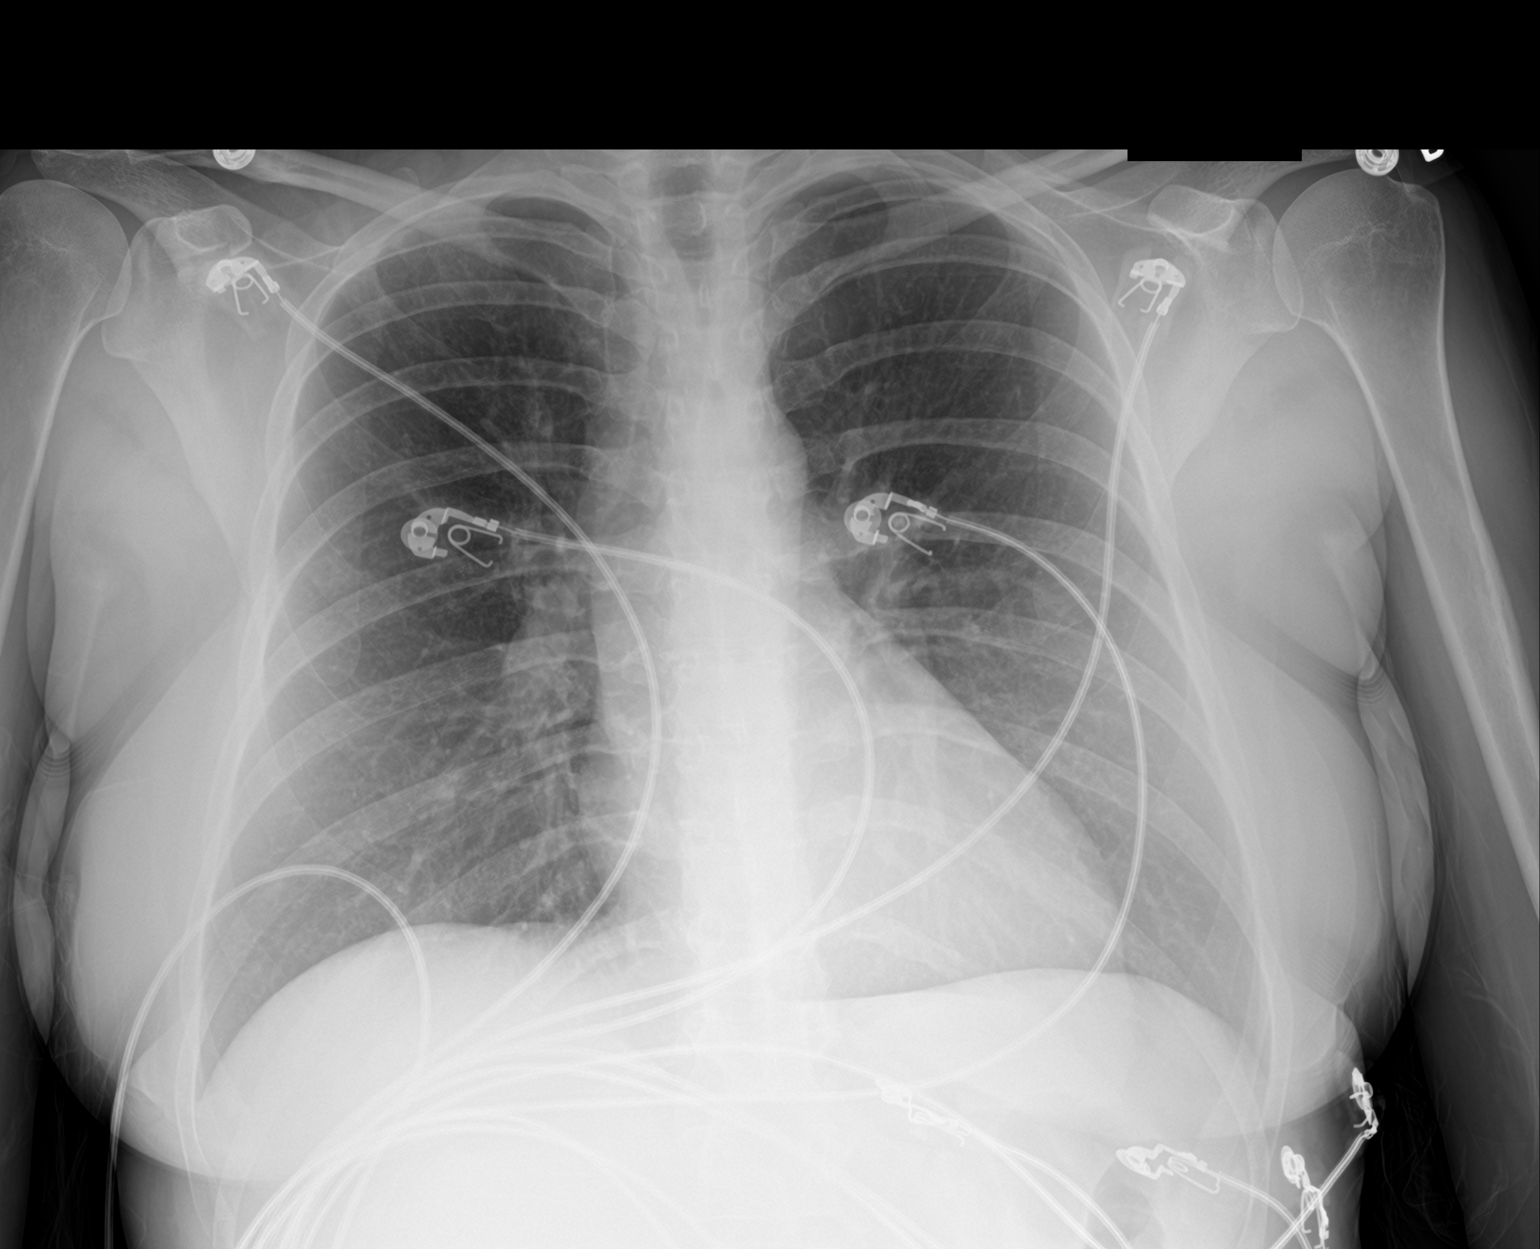

[2 of 2 positions shown; findings below may reference images not displayed]

FINDINGS: Lungs are clear.  No pleural effusion or pneumothorax.

The heart is normal in size.

Visualized osseous structures are within normal limits.
IMPRESSION: Normal chest radiographs.

## 2017-12-06 MED ORDER — IPRATROPIUM-ALBUTEROL 0.5-2.5 (3) MG/3ML IN SOLN
3.0000 mL | Freq: Once | RESPIRATORY_TRACT | Status: AC
  Administered 2017-12-06: 3 mL via RESPIRATORY_TRACT
  Filled 2017-12-06: qty 3

## 2017-12-06 MED ORDER — SODIUM CHLORIDE 0.9 % IV SOLN
INTRAVENOUS | Status: DC
  Administered 2017-12-07: 01:00:00 via INTRAVENOUS

## 2017-12-06 MED ORDER — SODIUM CHLORIDE 0.9 % IV SOLN
1.0000 g | Freq: Once | INTRAVENOUS | Status: AC
  Administered 2017-12-07: 1 g via INTRAVENOUS
  Filled 2017-12-06: qty 10

## 2017-12-06 MED ORDER — SODIUM CHLORIDE 0.9 % IV BOLUS
2000.0000 mL | Freq: Once | INTRAVENOUS | Status: AC
  Administered 2017-12-07: 2000 mL via INTRAVENOUS

## 2017-12-06 MED ORDER — IOPAMIDOL (ISOVUE-300) INJECTION 61%
INTRAVENOUS | Status: AC
  Filled 2017-12-06: qty 30

## 2017-12-06 MED ORDER — MORPHINE SULFATE (PF) 4 MG/ML IV SOLN
4.0000 mg | Freq: Once | INTRAVENOUS | Status: AC
  Administered 2017-12-06: 4 mg via INTRAVENOUS
  Filled 2017-12-06: qty 1

## 2017-12-06 MED ORDER — POTASSIUM CHLORIDE CRYS ER 20 MEQ PO TBCR
40.0000 meq | EXTENDED_RELEASE_TABLET | Freq: Once | ORAL | Status: AC
  Administered 2017-12-06: 40 meq via ORAL
  Filled 2017-12-06: qty 2

## 2017-12-06 MED ORDER — POTASSIUM CHLORIDE CRYS ER 20 MEQ PO TBCR
40.0000 meq | EXTENDED_RELEASE_TABLET | Freq: Once | ORAL | Status: AC
  Administered 2017-12-07: 40 meq via ORAL
  Filled 2017-12-06: qty 2

## 2017-12-06 MED ORDER — POTASSIUM CHLORIDE 10 MEQ/100ML IV SOLN
10.0000 meq | INTRAVENOUS | Status: AC
  Administered 2017-12-06 – 2017-12-07 (×3): 10 meq via INTRAVENOUS
  Filled 2017-12-06 (×3): qty 100

## 2017-12-06 MED ORDER — HYDROCOD POLST-CPM POLST ER 10-8 MG/5ML PO SUER
5.0000 mL | Freq: Once | ORAL | Status: AC
  Administered 2017-12-06: 5 mL via ORAL
  Filled 2017-12-06: qty 5

## 2017-12-06 MED ORDER — SODIUM CHLORIDE 0.9 % IV SOLN
1.0000 g | Freq: Once | INTRAVENOUS | Status: AC
  Administered 2017-12-06: 1 g via INTRAVENOUS
  Filled 2017-12-06: qty 10

## 2017-12-06 MED ORDER — MAGNESIUM SULFATE 2 GM/50ML IV SOLN
2.0000 g | Freq: Once | INTRAVENOUS | Status: AC
  Administered 2017-12-06: 2 g via INTRAVENOUS
  Filled 2017-12-06: qty 50

## 2017-12-06 MED ORDER — SODIUM CHLORIDE 0.9 % IV BOLUS
1000.0000 mL | Freq: Once | INTRAVENOUS | Status: AC
Start: 2017-12-06 — End: 2017-12-06
  Administered 2017-12-06: 1000 mL via INTRAVENOUS

## 2017-12-06 MED ORDER — ONDANSETRON HCL 4 MG/2ML IJ SOLN
4.0000 mg | Freq: Once | INTRAMUSCULAR | Status: AC
  Administered 2017-12-06: 4 mg via INTRAVENOUS
  Filled 2017-12-06: qty 2

## 2017-12-06 MED ORDER — IPRATROPIUM-ALBUTEROL 0.5-2.5 (3) MG/3ML IN SOLN
3.0000 mL | Freq: Once | RESPIRATORY_TRACT | Status: AC
  Administered 2017-12-07: 3 mL via RESPIRATORY_TRACT
  Filled 2017-12-06: qty 3

## 2017-12-06 MED ORDER — SODIUM CHLORIDE 0.9 % IV SOLN
500.0000 mg | Freq: Once | INTRAVENOUS | Status: AC
  Administered 2017-12-06: 500 mg via INTRAVENOUS
  Filled 2017-12-06: qty 500

## 2017-12-06 MED ORDER — SODIUM CHLORIDE 0.9 % IV BOLUS: 1000.0000 mL | Freq: Once | INTRAVENOUS | Status: DC

## 2017-12-06 NOTE — ED Notes (Signed)
PA Tatiana notified that patient has minute veins and unable to start another IV access , IV team consult ordered. Unable to collect blood cultures at this time .

## 2017-12-06 NOTE — ED Notes (Signed)
Patient transported to x-ray. ?

## 2017-12-06 NOTE — ED Triage Notes (Addendum)
Per [facility]: Patient to ED from home c/o low potassium and magnesium levels - states this happens from time to time and she gets tingling in her hands and face. Hx Chron's and has had diarrhea for the past few days - patient states she is also dealing with URI symptoms (cough, sore throat, runny nose) x 4 days and has not been able to eat/drink much for 3 days. Patient has weak, dry cough, but respirations are e/u. Pt also c/o generalized body aches since yesterday - denies fevers/chills. EMS VS: 142/90, HR 100 ST, RR 18, 98% RA, CBG 89. 22g. R wrist, given 250cc NS PTA.

## 2017-12-06 NOTE — ED Notes (Signed)
PA inserted 18g. angiocath at right upper arm and collected 1st blood culture .

## 2017-12-06 NOTE — ED Notes (Signed)
Pharmacist notified on pt.'s Calcium Gluconate order.  

## 2017-12-06 NOTE — H&P (Signed)
History and Physical    [name] IRC:[id number] DOB: 24-Sep-1980 DOA: 12/06/2017  Referring MD/NP/PA:   PCP: Patient, [name]   Patient coming from:  The patient is coming from home.  At baseline, pt is independent for most of ADL.  Chief Complaint: Cough, chest pain, hearing loss, ear pain, nausea, vomiting, diarrhea, tingling in hands and face  HPI: [name] is a 37 y.o. female with medical history significant of Crohn's disease, hypertension, otitis media, GERD, anxiety, migraine headache, fibromyalgia, endometriosis, tobacco abuse, IV drug use, who presents with cough, chest pain, hearing loss, ear pain, nausea, vomiting, diarrhea, tingling in hands and face.  Patient states that he has been having cough, runny nose, sore throat in the past 4 days.  She also has bilateral ear pain, ear draining and hearing loss. She has chest pain, which is located from the chest, constant, sharp, 8 out of 10 in severity, nonradiating, pleuritic, aggravated by coughing and deep breaths.  Patient does not have fever, but has chills.  She states that she is not taking medications for Crohn's disease, and has been having diarrhea, nausea, vomiting and abdominal pain.  She has mild diffuse abdominal pain.  She vomited more than 10 times yesterday.  She states that she has more than 10 watery diarrhea each day.  Denies symptoms of UTI or unilateral weakness.  ED Course: pt was found to have WBC 36.2, lactic acid of 1.56, lipase 18. Pregnancy test hCG 46.3, but the repeated serum beta hCG is 3.0.  Negative flu PCR, potassium 2.0, magnesium 0.4, calcium 5.0, acute renal injury with creatinine 1.94, temperature normal, tachycardia, tachypnea, oxygen saturation 98% on room air, chest x-ray negative for infiltration.  CT abdomen/pelvis is negative, did not show obstruction or wall thickening.  Patient is admitted to telemetry bed as inpatient.  Review of Systems:   General: no fevers, has chills, has  poor appetite, has fatigue HEENT: no blurry vision, has sore throat, running nose, hearing loss, ear pain Respiratory: has dyspnea, coughing, wheezing CV: has chest pain, no palpitations GI: no nausea, vomiting, abdominal pain, diarrhea, no constipation GU: no dysuria, burning on urination, increased urinary frequency, hematuria  Ext: no leg edema Neuro: no unilateral weakness, numbness, or tingling, no vision change or hearing loss Skin: no rash, no skin tear. MSK: No muscle spasm, no deformity, no limitation of range of movement in spin Heme: No easy bruising.  Travel history: No recent long distant travel.  Allergy:  Allergies  Allergen Reactions  . Penicillins Hives    Has patient had a PCN reaction causing immediate rash, facial/tongue/throat swelling, SOB or lightheadedness with hypotension: Yes Has patient had a PCN reaction causing severe rash involving mucus membranes or skin necrosis: Unk Has patient had a PCN reaction that required hospitalization: Yes Has patient had a PCN reaction occurring within the last 10 years: No If all of the above answers are "NO", then may proceed with Cephalosporin use.   Marland Kitchen Doxycycline Other (See Comments)    Patient cannot recall the reaction    Past Medical History:  Diagnosis Date  . Abdominal pain, unspecified site 03/24/2009  . 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 ADHD 12/02/2009  . Allergic rhinitis, cause unspecified 01/21/2011  . ANXIETY 03/24/2009  . B12 DEFICIENCY 04/28/2009  . BURSITIS, RIGHT KNEE 02/05/2010  . Cellulitis and abscess of leg, except foot 02/05/2010  . Cervicalgia 12/02/2009  . COMMON MIGRAINE 02/05/2010  . CR[facility]'S [facility] INTESTINE 05/19/2009  . ECZEMA  05/20/2010  . Endometriosis 08/04/2011  . Fibromyalgia   . GERD 03/24/2009  . HEADACHE, CHRONIC 03/24/2009  . HYPERTENSION 03/24/2009  . Osteoarthritis   . OTITIS MEDIA, LEFT 08/12/2010  . SMOKER 12/02/2009  . Spine pain 01/21/2011   neck and thoracic spine  . VITAMIN B1  DEFICIENCY 09/21/2009  . Wheezing 08/12/2010    Past Surgical History:  Procedure Laterality Date  . BREAST ENHANCEMENT SURGERY  2004  . ENDOMETRIAL ABLATION  01/2009   Thinks laproscopic with possible transvaginal    Social History:  reports that she has been smoking cigarettes.  She has a 7.00 pack-year smoking history. She has never used smokeless tobacco. She reports that she drinks alcohol. She reports that she has current or past drug history. Drug: IV.  Family History:  Family History  Problem Relation Age of Onset  . Cancer Mother        breast  . Clotting disorder Mother   . Cancer Maternal Grandmother        Stomach Cancer  . Cancer Maternal Grandfather        Esophageal Cancer     Prior to Admission medications   Medication Sig Start Date End Date Taking? Authorizing Provider  esomeprazole (NEXIUM) 40 MG capsule Take 40 mg by mouth 2 (two) times daily before a meal.   Yes [provider]  naproxen sodium (ALEVE) 220 MG tablet Take 220-440 mg by mouth 2 (two) times daily as needed (for pain or discomfort).   Yes [provider]  ALPRAZolam Duanne Moron) 1 MG tablet Take 1 tablet (1 mg total) by mouth 3 (three) times daily as needed for anxiety. Patient not taking: Reported on 12/06/2017 11/05/13   Susy Frizzle, MD  budesonide (ENTOCORT EC) 3 MG 24 hr capsule Take 3 capsules (9 mg total) by mouth daily. Patient not taking: Reported on 12/06/2017 10/30/13   Kelvin Cellar, MD  ciprofloxacin (CIPRO) 500 MG tablet Take 1 tablet (500 mg total) by mouth every 12 (twelve) hours. Patient not taking: Reported on 12/06/2017 04/18/16   Ashley Murrain, NP  clindamycin (CLEOCIN) 300 MG capsule Take 1 capsule (300 mg total) by mouth every 6 (six) hours. Patient not taking: Reported on 12/06/2017 04/18/16   Ashley Murrain, NP  fexofenadine (ALLEGRA) 180 MG tablet Take 1 tablet (180 mg total) by mouth daily. Patient not taking: Reported on 12/06/2017 03/28/12 12/06/17  Biagio Borg, MD    magnesium oxide (MAG-OX) 400 MG tablet Take 1 tablet (400 mg total) by mouth 2 (two) times daily. Patient not taking: Reported on 12/06/2017 11/07/13   Susy Frizzle, MD  metoprolol succinate (TOPROL-XL) 100 MG 24 hr tablet Take 1 tablet (100 mg total) by mouth daily. Take with or immediately following a meal. Patient not taking: Reported on 12/06/2017 11/05/13   Susy Frizzle, MD  ondansetron (ZOFRAN ODT) 4 MG disintegrating tablet Take 1 tablet (4 mg total) by mouth every 8 (eight) hours as needed for nausea or vomiting. Patient not taking: Reported on 12/06/2017 09/28/17   Robinson, [place] N, PA-C  oxyCODONE (ROXICODONE) 5 MG immediate release tablet Take 1 tablet (5 mg total) by mouth every 4 (four) hours as needed for severe pain. Patient not taking: Reported on 12/06/2017 04/18/16   Ashley Murrain, NP  oxyCODONE-acetaminophen (PERCOCET/ROXICET) 5-325 MG tablet Take 1 tablet by mouth every 6 (six) hours as needed for severe pain. Patient not taking: Reported on 12/06/2017 09/28/17   Robinson, [place] N, PA-C  pantoprazole (PROTONIX) 40 MG tablet Take 1 tablet (40 mg total) by mouth 2 (two) times daily. Patient not taking: Reported on 12/06/2017 11/05/13   Susy Frizzle, MD  polyethylene glycol [facility] / Floria Raveling) packet Take 34 g by mouth daily. Patient not taking: Reported on 12/06/2017 10/25/13   Wendall Papa, MD  amLODipine (NORVASC) 10 MG tablet Take 1 tablet (10 mg total) by mouth daily. 09/30/11 11/01/11  Biagio Borg, MD  pregabalin (LYRICA) 50 MG capsule Take 1 capsule (50 mg total) by mouth 3 (three) times daily. 09/30/11 11/01/11  Biagio Borg, MD    Physical Exam: Vitals:   12/06/17 2330 12/07/17 0000 12/07/17 0030 12/07/17 0402  BP: (!) 144/121 (!) 137/92 (!) 128/93   Pulse: (!) 107 (!) 107 (!) 117 (!) 113  Resp: (!) 23 19 16 14   Temp:      TempSrc:      SpO2: 99% 100% 100% 100%  Weight:      Height:       General: Not in acute distress HEENT:       Eyes: PERRL, EOMI, no  scleral icterus.       ENT: No discharge from the ears and nose, no pharynx injection, no tonsillar enlargement.        Neck: No JVD, no bruit, no mass felt. Heme: No neck lymph node enlargement. Cardiac: S1/S2, RRR, No murmurs, No gallops or rubs. Respiratory: has rhonchi bilaterally and mild wheezing bilaterally GI: Soft, nondistended, has diffused tenderness, no rebound pain, no organomegaly, BS present. GU: No hematuria Ext: No pitting leg edema bilaterally. 2+DP/PT pulse bilaterally. Musculoskeletal: No joint deformities, No joint redness or warmth, no limitation of ROM in spin. Skin: No rashes.  Neuro: Alert, oriented X3, cranial nerves II-XII grossly intact, moves all extremities normally Psych: Patient is not psychotic, no suicidal or hemocidal ideation.  Labs on Admission: I have personally reviewed following labs and imaging studies  CBC: Recent Labs  Lab 12/06/17 1957  WBC 36.2*  NEUTROABS 31.1*  HGB 10.9*  HCT 33.4*  MCV 76.8*  PLT 920*   Basic Metabolic Panel: Recent Labs  Lab 12/06/17 1957  NA 140  K 2.0*  CL 112*  CO2 14*  GLUCOSE 108*  BUN 13  CREATININE 1.94*  CALCIUM 5.0*  MG 0.4*   GFR: Estimated Creatinine Clearance: 32.5 mL/min (A) (by C-G formula based on SCr of 1.94 mg/dL (H)). Liver Function Tests: Recent Labs  Lab 12/06/17 1957  AST 14*  ALT 8*  ALKPHOS 155*  BILITOT 1.0  PROT 5.8*  ALBUMIN 2.2*   Recent Labs  Lab 12/06/17 1957  LIPASE 18   No results for input(s): AMMONIA in the last 168 hours. Coagulation Profile: No results for input(s): INR, PROTIME in the last 168 hours. Cardiac Enzymes: No results for input(s): CKTOTAL, CKMB, CKMBINDEX, TROPONINI in the last 168 hours. BNP (last 3 results) No results for input(s): PROBNP in the last 8760 hours. HbA1C: No results for input(s): HGBA1C in the last 72 hours. CBG: No results for input(s): GLUCAP in the last 168 hours. Lipid Profile: No results for input(s): CHOL, HDL,  LDLCALC, TRIG, CHOLHDL, LDLDIRECT in the last 72 hours. Thyroid Function Tests: No results for input(s): TSH, T4TOTAL, FREET4, T3FREE, THYROIDAB in the last 72 hours. Anemia Panel: No results for input(s): VITAMINB12, FOLATE, FERRITIN, TIBC, IRON, RETICCTPCT in the last 72 hours. Urine analysis:    Component Value Date/Time   COLORURINE YELLOW 09/27/2017 1540   APPEARANCEUR HAZY (  A) 09/27/2017 1540   LABSPEC 1.016 09/27/2017 1540   PHURINE 5.0 09/27/2017 1540   GLUCOSEU NEGATIVE 09/27/2017 1540   HGBUR NEGATIVE 09/27/2017 1540   BILIRUBINUR NEGATIVE 09/27/2017 1540   KETONESUR NEGATIVE 09/27/2017 1540   PROTEINUR NEGATIVE 09/27/2017 1540   UROBILINOGEN 0.2 10/22/2010 0038   NITRITE NEGATIVE 09/27/2017 1540   LEUKOCYTESUR NEGATIVE 09/27/2017 1540   Sepsis Labs: @LABRCNTIP (procalcitonin:4,lacticidven:4) )No results found for this or any previous visit (from the past 240 hour(s)).   Radiological Exams on Admission: Ct Abdomen Pelvis Wo Contrast  Result Date: 12/07/2017 CLINICAL DATA:  Inflammatory bowel disease. EXAM: CT ABDOMEN AND PELVIS WITHOUT CONTRAST TECHNIQUE: Multidetector CT imaging of the abdomen and pelvis was performed following the standard protocol without IV contrast. COMPARISON:  09/28/2017 FINDINGS: Lower chest: Lung bases are clear. Bilateral breast implants. Fluid in the distal esophagus may indicate reflux or dysmotility. Hepatobiliary: Gallbladder is somewhat distended without wall thickening or inflammatory changes. No stones are identified. No bile duct dilatation. No focal liver lesions. Pancreas: Unremarkable. No pancreatic ductal dilatation or surrounding inflammatory changes. Spleen: Normal in size without focal abnormality. Adrenals/Urinary Tract: Adrenal glands are unremarkable. Kidneys are normal, without renal calculi, focal lesion, or hydronephrosis. Bladder is unremarkable. Stomach/Bowel: Hyperdense ingested material within the stomach. Stomach, small bowel,  and colon are mostly decompressed. Postoperative changes in the right colon likely representing a partial right hemicolectomy and ileocolonic anastomosis. No definite bowel wall thickening although under distention limits evaluation. Vascular/Lymphatic: No significant vascular findings are present. No enlarged abdominal or pelvic lymph nodes. Reproductive: Uterus and bilateral adnexa are unremarkable. Other: No abdominal wall hernia or abnormality. No abdominopelvic ascites. Musculoskeletal: No acute or significant osseous findings. IMPRESSION: No acute process demonstrated in the abdomen or pelvis. No evidence of bowel obstruction or inflammation. No bowel wall thickening is identified but under distention limits evaluation. Mild gallbladder distention without inflammatory changes or stones, likely physiologic. Electronically Signed   By: Lucienne Capers M.D.   On: 12/07/2017 00:46   Dg Chest 2 View  Result Date: 12/06/2017 CLINICAL DATA:  Cough, shortness of breath, chest pain EXAM: CHEST - 2 VIEW COMPARISON:  08/13/2017 FINDINGS: Lungs are clear.  No pleural effusion or pneumothorax. The heart is normal in size. Visualized osseous structures are within normal limits. IMPRESSION: Normal chest radiographs. Electronically Signed   By: Julian Hy M.D.   On: 12/06/2017 20:07     EKG: Independently reviewed.  Sinus rhythm, QTC 486, anteroseptal infarction pattern, nonspecific T wave change.  Assessment/Plan Principal Problem:   Bronchitis Active Problems:   Anxiety state   Essential hypertension   GERD   CROHN'S DISEASE-SMALL INTESTINE   Abdominal pain, generalized   Hypokalemia   Hypocalcemia   Hypomagnesemia   Sepsis ([place])   AKI (acute kidney injury) ([place])   Bronchitis vs.undiagnosed COPD and sepsis: Patient respiratory symptoms are likely due to bronchitis versus undiagnosed COPD given history of smoking.  Chest x-ray negative for infiltration, but she has severe leukocytosis and  sepsis, early stage of pneumonia is also possible.  Patient meets criteria for sepsis with leukocytosis, tachycardia and tachypnea.  Lactic acid is normal.  Hemodynamically stable currently. Flu pcr negative  -will admit patient to telemetry bed  -Nebulizers: scheduled Atrovent and prn Xopenex Nebs -Solu-Medrol 60 mg IV tid -Z pak -Mucinex for cough  -Incentive spirometry -Urine S. pneumococcal antigen -Follow up blood culture x2, sputum culture, respiratory virus panel -Nasal cannula oxygen as needed to maintain O2 saturation 92% or greater -will get  Procalcitonin and trend lactic acid levels per sepsis protocol. -IVF: 2L of NS bolus in ED, followed by 100 cc/h   Ear pain and hearing loss: possibly due to otitis media -On antibiotics as above -May need to discuss with ENT  HTN:  -Continue home medications: Metoprolol -IV hydralazine prn  GERD: -Protonix  Crohn's dz:  Pt has worsening diarrhea, nausea vomiting and abdominal pain.  CT scan showed no obstruction or bowel wall thickening.  Lipase normal. -Follow-up C diff PCR, GI pathogen panel -check ESR and CRP -Patient is on Solu-Medrol which is for possible COPD, and should cover Crohn's dz flare up.  Anxiety state:  -continue prn xanax  Hypokalemia, Hypocalcemia, Hypomagnesemia: -repleted  AKI: Likely due to prerenal secondary to dehydration and continuation of NSAIDs - IVF as above - Follow up renal function by BMP - Check FeNa  - Hold Naproxen   DVT ppx: SQ Lovenox Code Status: Full code Family Communication: None at bed side.    Disposition Plan:  Anticipate discharge back to previous home environment Consults called:  none Admission status:  Inpatient/tele      Date of Service 12/07/2017    Ivor Costa [facility] Pager [phone]  If 7PM-7AM, please contact night-coverage [email or web address] Password [email or web address] 12/07/2017, 4:53 AM

## 2017-12-06 NOTE — ED Provider Notes (Addendum)
[facility] EMERGENCY DEPARTMENT Provider Note   CSN: [id number] Arrival date & time: 12/06/17  1819     History   Chief Complaint Chief Complaint  Patient presents with  . Tingling    HPI [name] is a 37 y.o. female.  HPI [name] is a 37 y.o. female with hx of crohn's disease, rectal prolapse, chronic diarrhea, fibromyalgia, anxiety, HTN, presents to emergency department complaining of flulike symptoms as well as tingling and numbness in bilateral hands and feet.  Patient states that her flu symptoms started approximately 5 days ago.  She is not sure if she has had a fever, however reports sinus pain and pressure, nasal congestion, cough with productive sputum, body aches and chills, abdominal pain, nausea, vomiting, bilateral ear pain and decreased hearing.  She has been taking over-the-counter medications which has not helped.  She states yesterday she developed tingling in bilateral hands and feet, and states her body feels crampy.  She states last time this happened her potassium magnesium levels were low.  She does have significant Crohn's disease, she has chronic diarrhea, currently not on any medications, followed by [facility].  Past Medical History:  Diagnosis Date  . Abdominal pain, unspecified site 03/24/2009  . 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 ADHD 12/02/2009  . Allergic rhinitis, cause unspecified 01/21/2011  . ANXIETY 03/24/2009  . B12 DEFICIENCY 04/28/2009  . BURSITIS, RIGHT KNEE 02/05/2010  . Cellulitis and abscess of leg, except foot 02/05/2010  . Cervicalgia 12/02/2009  . COMMON MIGRAINE 02/05/2010  . [facility] INTESTINE 05/19/2009  . ECZEMA 05/20/2010  . Endometriosis 08/04/2011  . Fibromyalgia   . GERD 03/24/2009  . HEADACHE, CHRONIC 03/24/2009  . HYPERTENSION 03/24/2009  . Osteoarthritis   . OTITIS MEDIA, LEFT 08/12/2010  . SMOKER 12/02/2009  . Spine pain 01/21/2011   neck and thoracic spine  . VITAMIN B1 DEFICIENCY 09/21/2009  .  Wheezing 08/12/2010    Patient Active Problem List   Diagnosis Date Noted  . 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 Abdominal pain 10/09/2013  . Rectal prolapse 10/09/2013  . Blurred vision, bilateral 03/28/2012  . Thoracic spine pain 03/28/2012  . Chronic narcotic dependence ([place]) 11/02/2011  . Abdominal pain, generalized 11/02/2011  . Hypokalemia 11/02/2011  . Radiculopathy 11/02/2011  . Bilateral shoulder pain 10/01/2011  . Cervical radiculopathy 09/30/2011  . Pain, joint, multiple sites 09/30/2011  . Lumbar radicular pain 08/04/2011  . Chronic neck pain 08/04/2011  . Endometriosis 08/04/2011  . Allergic rhinitis, cause unspecified 01/21/2011  . Chronic back pain 01/21/2011  . B12 deficiency 12/13/2010  . Preventative health care 12/06/2010  . ECZEMA 05/20/2010  . COMMON MIGRAINE 02/05/2010  . ADHD 12/02/2009  . 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 [facility] INTESTINE 05/19/2009  . ANXIETY 03/24/2009  . HYPERTENSION 03/24/2009  . GERD 03/24/2009  . HEADACHE, CHRONIC 03/24/2009    Past Surgical History:  Procedure Laterality Date  . BREAST ENHANCEMENT SURGERY  2004  . ENDOMETRIAL ABLATION  01/2009   Thinks laproscopic with possible transvaginal     OB History   None      Home Medications    Prior to Admission medications   Medication Sig Start Date End Date Taking? Authorizing Provider  ALPRAZolam Duanne Moron) 1 MG tablet Take 1 tablet (1 mg total) by mouth 3 (three) times daily as needed for anxiety. 11/05/13   Susy Frizzle, MD  budesonide (ENTOCORT EC) 3 MG 24 hr capsule Take  3 capsules (9 mg total) by mouth daily. 10/30/13   Kelvin Cellar, MD  ciprofloxacin (CIPRO) 500 MG tablet Take 1 tablet (500 mg total) by mouth every 12 (twelve) hours. 04/18/16   Ashley Murrain, NP  clindamycin (CLEOCIN) 300 MG capsule Take 1 capsule (300 mg total) by mouth every 6 (six) hours. 04/18/16   Ashley Murrain, NP  cyclobenzaprine  (FLEXERIL) 10 MG tablet Take 10 mg by mouth 3 (three) times daily as needed for muscle spasms.     [provider]  fexofenadine (ALLEGRA) 180 MG tablet Take 1 tablet (180 mg total) by mouth daily. 03/28/12 11/05/13  Biagio Borg, MD  magnesium oxide (MAG-OX) 400 MG tablet Take 1 tablet (400 mg total) by mouth 2 (two) times daily. 11/07/13   Susy Frizzle, MD  metoprolol succinate (TOPROL-XL) 100 MG 24 hr tablet Take 1 tablet (100 mg total) by mouth daily. Take with or immediately following a meal. 11/05/13   Susy Frizzle, MD  montelukast (SINGULAIR) 10 MG tablet Take 10 mg by mouth daily as needed (for shortness of breath).    [provider]  ondansetron (ZOFRAN ODT) 4 MG disintegrating tablet Take 1 tablet (4 mg total) by mouth every 8 (eight) hours as needed for nausea or vomiting. 09/28/17   Robinson, [place] N, PA-C  oxyCODONE (ROXICODONE) 5 MG immediate release tablet Take 1 tablet (5 mg total) by mouth every 4 (four) hours as needed for severe pain. 04/18/16   Ashley Murrain, NP  oxyCODONE-acetaminophen (PERCOCET/ROXICET) 5-325 MG tablet Take 1 tablet by mouth every 6 (six) hours as needed for severe pain. 09/28/17   Robinson, [place] N, PA-C  pantoprazole (PROTONIX) 40 MG tablet Take 1 tablet (40 mg total) by mouth 2 (two) times daily. 11/05/13   Susy Frizzle, MD  polyethylene glycol (MIRALAX / Floria Raveling) packet Take 34 g by mouth daily. 10/25/13   Wendall Papa, MD  amLODipine (NORVASC) 10 MG tablet Take 1 tablet (10 mg total) by mouth daily. 09/30/11 11/01/11  Biagio Borg, MD  pregabalin (LYRICA) 50 MG capsule Take 1 capsule (50 mg total) by mouth 3 (three) times daily. 09/30/11 11/01/11  Biagio Borg, MD    Family History Family History  Problem Relation Age of Onset  . Cancer Mother        breast  . Clotting disorder Mother   . Cancer Maternal Grandmother        Stomach Cancer  . Cancer Maternal Grandfather        Esophageal Cancer    Social History Social  History   Tobacco Use  . Smoking status: Current Every Day Smoker    Packs/day: 0.50    Years: 14.00    Pack years: 7.00    Types: Cigarettes  . Smokeless tobacco: Never Used  . Tobacco comment: 5 daily  Substance Use Topics  . Alcohol use: Yes    Comment: occasionally  . Drug use: Yes    Types: IV     Allergies   Penicillins and Doxycycline   Review of Systems Review of Systems  Constitutional: Negative for chills and fever.  HENT: Positive for congestion, ear pain, hearing loss, postnasal drip, rhinorrhea, sinus pressure and sinus pain. Negative for ear discharge and sore throat.   Respiratory: Positive for cough, shortness of breath and wheezing. Negative for chest tightness.   Cardiovascular: Negative for chest pain, palpitations and leg swelling.  Gastrointestinal: Positive for abdominal pain, diarrhea, nausea and vomiting.  Genitourinary: Negative for dysuria, flank pain, pelvic pain, vaginal bleeding, vaginal discharge and vaginal pain.  Musculoskeletal: Positive for myalgias. Negative for arthralgias, neck pain and neck stiffness.  Skin: Negative for rash.  Neurological: Positive for numbness and headaches. Negative for dizziness and weakness.  All other systems reviewed and are negative.    Physical Exam Updated Vital Signs BP (!) 140/101 (BP Location: Left Arm)   Pulse (!) 103   Temp 98.5 F (36.9 C) (Oral)   Resp 20   Ht 5' 2.5" (1.588 m)   Wt 59 kg (130 lb)   LMP 12/03/2017 (Exact Date)   SpO2 100%   BMI 23.40 kg/m   Physical Exam  Constitutional: She appears well-developed and well-nourished. No distress.  HENT:  Head: Normocephalic.  Rhinorrhea bilaterally, postnasal drainage.  Ear canals normal bilaterally.  TMs are bilaterally bulging, erythematous.  Oropharynx is normal.  Eyes: Conjunctivae are normal.  Neck: Normal range of motion. Neck supple.  Cardiovascular: Regular rhythm and normal heart sounds.  Tachycardic  Pulmonary/Chest: Effort  normal. No respiratory distress. She has wheezes. She has no rales.  Expiratory wheezes bilaterally  Abdominal: Soft. Bowel sounds are normal. She exhibits no distension. There is tenderness. There is no rebound.  Diffuse tenderness  Musculoskeletal: She exhibits no edema.  Neurological: She is alert.  Skin: Skin is warm and dry.  Psychiatric: She has a normal mood and affect. Her behavior is normal.  Nursing note and vitals reviewed.    ED Treatments / Results  Labs (all labs ordered are listed, but only abnormal results are displayed) Labs Reviewed  CBC WITH DIFFERENTIAL/PLATELET - Abnormal; Notable for the following components:      Result Value   WBC 36.2 (*)    Hemoglobin 10.9 (*)    HCT 33.4 (*)    MCV 76.8 (*)    MCH 25.1 (*)    RDW 17.5 (*)    Platelets 491 (*)    Neutro Abs 31.1 (*)    Monocytes Absolute 2.2 (*)    All other components within normal limits  COMPREHENSIVE METABOLIC PANEL - Abnormal; Notable for the following components:   Potassium 2.0 (*)    Chloride 112 (*)    CO2 14 (*)    Glucose, Bld 108 (*)    Creatinine, Ser 1.94 (*)    Calcium 5.0 (*)    Total Protein 5.8 (*)    Albumin 2.2 (*)    AST 14 (*)    ALT 8 (*)    Alkaline Phosphatase 155 (*)    GFR calc non Af Amer 32 (*)    GFR calc Af Amer 37 (*)    All other components within normal limits  MAGNESIUM - Abnormal; Notable for the following components:   Magnesium 0.4 (*)    All other components within normal limits  I-STAT BETA HCG BLOOD, ED ([facility], [facility], AP ONLY) - Abnormal; Notable for the following components:   I-stat hCG, quantitative 46.3 (*)    All other components within normal limits  CULTURE, BLOOD (ROUTINE X 2)  CULTURE, BLOOD (ROUTINE X 2)  LIPASE, BLOOD  INFLUENZA PANEL BY PCR (TYPE A & B)  HCG, QUANTITATIVE, PREGNANCY  I-STAT CG4 LACTIC ACID, ED  I-STAT CG4 LACTIC ACID, ED    EKG EKG Interpretation  Date/Time:  Wednesday Dec 06 2017 18:34:27 EDT Ventricular Rate:   99 PR Interval:    QRS Duration: 85 QT Interval:  378 QTC Calculation: 486 R Axis:   72  Text Interpretation:  Sinus rhythm Atrial premature complex Probable anteroseptal infarct, old Borderline repol abnrm, inferolateral leads When compared to prior, new t wave flattening in leads 2, 3, AVF, V5-V6.  No STEMI Confirmed by Antony Blackbird [id number]) on 12/06/2017 7:13:18 PM   Radiology Dg Chest 2 View  Result Date: 12/06/2017 CLINICAL DATA:  Cough, shortness of breath, chest pain EXAM: CHEST - 2 VIEW COMPARISON:  08/13/2017 FINDINGS: Lungs are clear.  No pleural effusion or pneumothorax. The heart is normal in size. Visualized osseous structures are within normal limits. IMPRESSION: Normal chest radiographs. Electronically Signed   By: Julian Hy M.D.   On: 12/06/2017 20:07    Procedures Procedures (including critical care time) CRITICAL CARE Performed by: Abrish Erny Total critical care time: 40 minutes Critical care time was exclusive of separately billable procedures and treating other patients. Critical care was necessary to treat or prevent imminent or life-threatening deterioration. Critical care was time spent personally by me on the following activities: development of treatment plan with patient and/or surrogate as well as nursing, discussions with consultants, evaluation of patient's response to treatment, examination of patient, obtaining history from patient or surrogate, ordering and performing treatments and interventions, ordering and review of laboratory studies, ordering and review of radiographic studies, pulse oximetry and re-evaluation of patient's condition.  Medications Ordered in ED Medications  potassium chloride 10 mEq in 100 mL IVPB (10 mEq Intravenous New Bag/Given 12/06/17 2340)  azithromycin (ZITHROMAX) 500 mg in sodium chloride 0.9 % 250 mL IVPB (500 mg Intravenous New Bag/Given 12/06/17 2358)  ipratropium-albuterol (DUONEB) 0.5-2.5 (3) MG/3ML nebulizer  solution 3 mL (has no administration in time range)  potassium chloride SA (K-DUR,KLOR-CON) CR tablet 40 mEq (has no administration in time range)  calcium gluconate 1 g in sodium chloride 0.9 % 100 mL IVPB (has no administration in time range)  sodium chloride 0.9 % bolus 2,000 mL (has no administration in time range)  0.9 %  sodium chloride infusion (has no administration in time range)  sodium chloride 0.9 % bolus 1,000 mL (0 mLs Intravenous Stopped 12/06/17 2033)  ipratropium-albuterol (DUONEB) 0.5-2.5 (3) MG/3ML nebulizer solution 3 mL (3 mLs Nebulization Given 12/06/17 1922)  ondansetron (ZOFRAN) injection 4 mg (4 mg Intravenous Given 12/06/17 1920)  morphine 4 MG/ML injection 4 mg (4 mg Intravenous Given 12/06/17 1920)  potassium chloride SA (K-DUR,KLOR-CON) CR tablet 40 mEq (40 mEq Oral Given 12/06/17 2220)  calcium gluconate 1 g in sodium chloride 0.9 % 100 mL IVPB (0 g Intravenous Stopped 12/06/17 2340)  magnesium sulfate IVPB 2 g 50 mL (0 g Intravenous Stopped 12/06/17 2309)  chlorpheniramine-HYDROcodone (TUSSIONEX) 10-8 MG/5ML suspension 5 mL (5 mLs Oral Given 12/06/17 2307)  cefTRIAXone (ROCEPHIN) 1 g in sodium chloride 0.9 % 100 mL IVPB (0 g Intravenous Stopped 12/06/17 2358)  morphine 4 MG/ML injection 4 mg (4 mg Intravenous Given 12/06/17 2316)     Initial Impression / Assessment and Plan / ED Course  I have reviewed the triage vital signs and the nursing notes.  Pertinent labs & imaging results that were available during my care of the patient were reviewed by me and considered in my medical decision making (see chart for details).     Patient with flulike symptoms.  She is afebrile, however mildly tachycardic, appears to be dry.  She has bilateral otitis media on the exam with sinus pressure, concerning for sinusitis.  She is coughing continuously.  Will give a breathing treatment.  Will give morphine and Zofran  for her pain and nausea.  Will check labs.  Chest x-ray ordered as well.  9:30  PM Patient's labs show normal lactic acid, however elevated WBC at 36.2.  Interestingly patient's white blood cell count was elevated when she was seen in February, 20.8.  This is attributed to her abdominal pain.  Low hemoglobin at 10.9, baseline hemoglobin is 12.5.  Patient's platelets are elevated, history of the same, with no diagnosis.  Question hematological disease versus leukemia?  Her main complaint at this time is upper respiratory symptoms sinusitis, cough, congestion.  I will cover her empirically with antibiotics for possible pneumonia.,  IV fluids still running.  We will add blood cultures.  Patient's potassium is 2, IV and p.o. potassium ordered.  Patient's magnesium is 0.4, IV magnesium ordered.  Patient's calcium is 5.  Calcium gluconate ordered.  Patient does have chronic diarrhea, question whether this could be causing her electrolyte disturbance.  She does have history of hypokalemia and hypomagnesemia in the past.  Her EKG shows no changes.  We will continue to keep on cardiac monitor.  Will need admission.  11:39 PM Patient continues to be mildly tachycardic, receiving IV fluids and electrolytes.  CT abdomen pelvis ordered for persistent pain and history of Crohn's disease.  Paged for admission.  Spoke with medicine, will admit.   Vitals:   12/06/17 2100 12/06/17 2115 12/06/17 2130 12/06/17 2230  BP: 112/89 126/80 (!) 122/106 (!) 138/100  Pulse: (!) 110 (!) 110 (!) 117 (!) 113  Resp: 17 (!) 22 (!) 25 12  Temp:      TempSrc:      SpO2: 100% 98% 100% 100%  Weight:      Height:         Final Clinical Impressions(s) / ED Diagnoses   Final diagnoses:  Hypokalemia  Hypocalcemia  Hypomagnesemia  Acute kidney injury ([place])  Upper respiratory tract infection, unspecified type  Leukocytosis, unspecified type  Anemia, unspecified type  Abdominal pain, unspecified abdominal location    ED Discharge Orders    None       Jeannett Senior, PA-C 12/07/17 0001      Tegeler, Gwenyth Allegra, MD 12/07/17 0215    Jeannett Senior, PA-C 12/31/17 4008    Tegeler, Gwenyth Allegra, MD 01/01/18 [phone]

## 2017-12-07 ENCOUNTER — Other Ambulatory Visit: Payer: Self-pay

## 2017-12-07 ENCOUNTER — Encounter (HOSPITAL_COMMUNITY): Payer: Self-pay | Admitting: General Practice

## 2017-12-07 ENCOUNTER — Emergency Department (HOSPITAL_COMMUNITY): Payer: Medicaid Other

## 2017-12-07 ENCOUNTER — Inpatient Hospital Stay (HOSPITAL_COMMUNITY): Payer: Medicaid Other

## 2017-12-07 DIAGNOSIS — Z9911 Dependence on respirator [ventilator] status: Secondary | ICD-10-CM | POA: Diagnosis not present

## 2017-12-07 DIAGNOSIS — R109 Unspecified abdominal pain: Secondary | ICD-10-CM | POA: Diagnosis not present

## 2017-12-07 DIAGNOSIS — F1110 Opioid abuse, uncomplicated: Secondary | ICD-10-CM | POA: Diagnosis not present

## 2017-12-07 DIAGNOSIS — E8770 Fluid overload, unspecified: Secondary | ICD-10-CM | POA: Diagnosis not present

## 2017-12-07 DIAGNOSIS — F411 Generalized anxiety disorder: Secondary | ICD-10-CM | POA: Diagnosis not present

## 2017-12-07 DIAGNOSIS — E872 Acidosis: Secondary | ICD-10-CM | POA: Diagnosis present

## 2017-12-07 DIAGNOSIS — M797 Fibromyalgia: Secondary | ICD-10-CM | POA: Diagnosis not present

## 2017-12-07 DIAGNOSIS — I10 Essential (primary) hypertension: Secondary | ICD-10-CM | POA: Diagnosis present

## 2017-12-07 DIAGNOSIS — E860 Dehydration: Secondary | ICD-10-CM | POA: Diagnosis present

## 2017-12-07 DIAGNOSIS — F909 Attention-deficit hyperactivity disorder, unspecified type: Secondary | ICD-10-CM | POA: Diagnosis not present

## 2017-12-07 DIAGNOSIS — D62 Acute posthemorrhagic anemia: Secondary | ICD-10-CM | POA: Diagnosis not present

## 2017-12-07 DIAGNOSIS — G6281 Critical illness polyneuropathy: Secondary | ICD-10-CM | POA: Diagnosis present

## 2017-12-07 DIAGNOSIS — Z720 Tobacco use: Secondary | ICD-10-CM | POA: Diagnosis not present

## 2017-12-07 DIAGNOSIS — J189 Pneumonia, unspecified organism: Secondary | ICD-10-CM | POA: Diagnosis not present

## 2017-12-07 DIAGNOSIS — F1010 Alcohol abuse, uncomplicated: Secondary | ICD-10-CM | POA: Diagnosis not present

## 2017-12-07 DIAGNOSIS — R451 Restlessness and agitation: Secondary | ICD-10-CM | POA: Diagnosis not present

## 2017-12-07 DIAGNOSIS — F1120 Opioid dependence, uncomplicated: Secondary | ICD-10-CM | POA: Diagnosis present

## 2017-12-07 DIAGNOSIS — E874 Mixed disorder of acid-base balance: Secondary | ICD-10-CM | POA: Diagnosis present

## 2017-12-07 DIAGNOSIS — J9601 Acute respiratory failure with hypoxia: Secondary | ICD-10-CM | POA: Diagnosis not present

## 2017-12-07 DIAGNOSIS — R509 Fever, unspecified: Secondary | ICD-10-CM | POA: Diagnosis not present

## 2017-12-07 DIAGNOSIS — E538 Deficiency of other specified B group vitamins: Secondary | ICD-10-CM | POA: Diagnosis not present

## 2017-12-07 DIAGNOSIS — D638 Anemia in other chronic diseases classified elsewhere: Secondary | ICD-10-CM | POA: Diagnosis present

## 2017-12-07 DIAGNOSIS — I361 Nonrheumatic tricuspid (valve) insufficiency: Secondary | ICD-10-CM | POA: Diagnosis not present

## 2017-12-07 DIAGNOSIS — K5080 Crohn's disease of both small and large intestine without complications: Secondary | ICD-10-CM | POA: Diagnosis present

## 2017-12-07 DIAGNOSIS — K529 Noninfective gastroenteritis and colitis, unspecified: Secondary | ICD-10-CM | POA: Diagnosis present

## 2017-12-07 DIAGNOSIS — J690 Pneumonitis due to inhalation of food and vomit: Secondary | ICD-10-CM | POA: Diagnosis present

## 2017-12-07 DIAGNOSIS — E876 Hypokalemia: Secondary | ICD-10-CM | POA: Diagnosis present

## 2017-12-07 DIAGNOSIS — N179 Acute kidney failure, unspecified: Secondary | ICD-10-CM | POA: Diagnosis present

## 2017-12-07 DIAGNOSIS — F10239 Alcohol dependence with withdrawal, unspecified: Secondary | ICD-10-CM | POA: Diagnosis not present

## 2017-12-07 DIAGNOSIS — R739 Hyperglycemia, unspecified: Secondary | ICD-10-CM | POA: Diagnosis present

## 2017-12-07 DIAGNOSIS — J329 Chronic sinusitis, unspecified: Secondary | ICD-10-CM | POA: Diagnosis not present

## 2017-12-07 DIAGNOSIS — B9789 Other viral agents as the cause of diseases classified elsewhere: Secondary | ICD-10-CM | POA: Diagnosis not present

## 2017-12-07 DIAGNOSIS — G9340 Encephalopathy, unspecified: Secondary | ICD-10-CM | POA: Diagnosis not present

## 2017-12-07 DIAGNOSIS — K922 Gastrointestinal hemorrhage, unspecified: Secondary | ICD-10-CM | POA: Diagnosis not present

## 2017-12-07 DIAGNOSIS — J019 Acute sinusitis, unspecified: Secondary | ICD-10-CM | POA: Diagnosis present

## 2017-12-07 DIAGNOSIS — G9341 Metabolic encephalopathy: Secondary | ICD-10-CM | POA: Diagnosis present

## 2017-12-07 DIAGNOSIS — K5090 Crohn's disease, unspecified, without complications: Secondary | ICD-10-CM | POA: Diagnosis not present

## 2017-12-07 DIAGNOSIS — R1084 Generalized abdominal pain: Secondary | ICD-10-CM | POA: Diagnosis not present

## 2017-12-07 DIAGNOSIS — Y92009 Unspecified place in unspecified non-institutional (private) residence as the place of occurrence of the external cause: Secondary | ICD-10-CM | POA: Diagnosis not present

## 2017-12-07 DIAGNOSIS — J0140 Acute pansinusitis, unspecified: Secondary | ICD-10-CM | POA: Diagnosis present

## 2017-12-07 DIAGNOSIS — J80 Acute respiratory distress syndrome: Secondary | ICD-10-CM | POA: Diagnosis not present

## 2017-12-07 DIAGNOSIS — H70899 Other mastoiditis and related conditions, unspecified ear: Secondary | ICD-10-CM | POA: Diagnosis present

## 2017-12-07 DIAGNOSIS — F17210 Nicotine dependence, cigarettes, uncomplicated: Secondary | ICD-10-CM | POA: Diagnosis not present

## 2017-12-07 DIAGNOSIS — J40 Bronchitis, not specified as acute or chronic: Secondary | ICD-10-CM | POA: Diagnosis not present

## 2017-12-07 DIAGNOSIS — D72829 Elevated white blood cell count, unspecified: Secondary | ICD-10-CM | POA: Diagnosis not present

## 2017-12-07 DIAGNOSIS — K219 Gastro-esophageal reflux disease without esophagitis: Secondary | ICD-10-CM | POA: Diagnosis not present

## 2017-12-07 DIAGNOSIS — E870 Hyperosmolality and hypernatremia: Secondary | ICD-10-CM | POA: Diagnosis not present

## 2017-12-07 DIAGNOSIS — F1910 Other psychoactive substance abuse, uncomplicated: Secondary | ICD-10-CM | POA: Diagnosis not present

## 2017-12-07 DIAGNOSIS — J0100 Acute maxillary sinusitis, unspecified: Secondary | ICD-10-CM | POA: Diagnosis not present

## 2017-12-07 DIAGNOSIS — H6693 Otitis media, unspecified, bilateral: Secondary | ICD-10-CM | POA: Diagnosis present

## 2017-12-07 DIAGNOSIS — N289 Disorder of kidney and ureter, unspecified: Secondary | ICD-10-CM | POA: Insufficient documentation

## 2017-12-07 DIAGNOSIS — A419 Sepsis, unspecified organism: Secondary | ICD-10-CM | POA: Diagnosis present

## 2017-12-07 LAB — BASIC METABOLIC PANEL
Anion gap: 14 (ref 5–15)
Anion gap: 10 (ref 5–15)
BUN: 12 mg/dL (ref 6–20)
BUN: 12 mg/dL (ref 6–20)
CO2: 10 mmol/L — ABNORMAL LOW (ref 22–32)
CO2: 12 mmol/L — ABNORMAL LOW (ref 22–32)
Calcium: 5.8 mg/dL — CL (ref 8.9–10.3)
Calcium: 8.1 mg/dL — ABNORMAL LOW (ref 8.9–10.3)
Chloride: 114 mmol/L — ABNORMAL HIGH (ref 101–111)
Chloride: 120 mmol/L — ABNORMAL HIGH (ref 101–111)
Creatinine, Ser: 1.89 mg/dL — ABNORMAL HIGH (ref 0.44–1.00)
Creatinine, Ser: 1.63 mg/dL — ABNORMAL HIGH (ref 0.44–1.00)
GFR calc Af Amer: 38 mL/min — ABNORMAL LOW (ref >60)
GFR calc Af Amer: 46 mL/min — ABNORMAL LOW (ref >60)
GFR calc non Af Amer: 33 mL/min — ABNORMAL LOW (ref >60)
GFR calc non Af Amer: 40 mL/min — ABNORMAL LOW (ref >60)
Glucose, Bld: 199 mg/dL — ABNORMAL HIGH (ref 65–99)
Glucose, Bld: 174 mg/dL — ABNORMAL HIGH (ref 65–99)
Potassium: 2.3 mmol/L — CL (ref 3.5–5.1)
Potassium: 2.8 mmol/L — ABNORMAL LOW (ref 3.5–5.1)
Sodium: 138 mmol/L (ref 135–145)
Sodium: 142 mmol/L (ref 135–145)

## 2017-12-07 LAB — COMPREHENSIVE METABOLIC PANEL
ALT: UNDETERMINED U/L (ref 14–54)
AST: 20 U/L (ref 15–41)
Albumin: 2.4 g/dL — ABNORMAL LOW (ref 3.5–5.0)
Alkaline Phosphatase: 168 U/L — ABNORMAL HIGH (ref 38–126)
Anion gap: 14 (ref 5–15)
BUN: 13 mg/dL (ref 6–20)
CO2: 10 mmol/L — ABNORMAL LOW (ref 22–32)
Calcium: 8.8 mg/dL — ABNORMAL LOW (ref 8.9–10.3)
Chloride: 119 mmol/L — ABNORMAL HIGH (ref 101–111)
Creatinine, Ser: 1.82 mg/dL — ABNORMAL HIGH (ref 0.44–1.00)
GFR calc Af Amer: 40 mL/min — ABNORMAL LOW (ref >60)
GFR calc non Af Amer: 35 mL/min — ABNORMAL LOW (ref >60)
Glucose, Bld: 102 mg/dL — ABNORMAL HIGH (ref 65–99)
Potassium: 2.4 mmol/L — CL (ref 3.5–5.1)
Sodium: 143 mmol/L (ref 135–145)
Total Bilirubin: UNDETERMINED mg/dL (ref 0.3–1.2)
Total Protein: 6.1 g/dL — ABNORMAL LOW (ref 6.5–8.1)

## 2017-12-07 LAB — PROCALCITONIN: Procalcitonin: 88.5 ng/mL

## 2017-12-07 LAB — MAGNESIUM
Magnesium: 2.2 mg/dL (ref 1.7–2.4)
Magnesium: 2 mg/dL (ref 1.7–2.4)

## 2017-12-07 LAB — HIV ANTIBODY (ROUTINE TESTING W REFLEX): HIV Screen 4th Generation wRfx: NONREACTIVE

## 2017-12-07 LAB — CREATININE, URINE, RANDOM: Creatinine, Urine: 21.66 mg/dL

## 2017-12-07 LAB — SODIUM, URINE, RANDOM: Sodium, Ur: 12 mmol/L

## 2017-12-07 LAB — LACTIC ACID, PLASMA
Lactic Acid, Venous: 2.7 mmol/L (ref 0.5–1.9)
Lactic Acid, Venous: 1.6 mmol/L (ref 0.5–1.9)

## 2017-12-07 LAB — C-REACTIVE PROTEIN: CRP: 38.3 mg/dL — ABNORMAL HIGH (ref <1.0)

## 2017-12-07 LAB — GROUP A STREP BY PCR: Group A Strep by PCR: NOT DETECTED

## 2017-12-07 LAB — STREP PNEUMONIAE URINARY ANTIGEN: Strep Pneumo Urinary Antigen: NEGATIVE

## 2017-12-07 LAB — SEDIMENTATION RATE: Sed Rate: 42 mm/hr — ABNORMAL HIGH (ref 0–22)

## 2017-12-07 IMAGING — CT CT ABD-PELV W/O CM
2 of 4 series · 16 of 46 positions shown, 18 images · non-contrast
Comparison: [DATE]

CLINICAL DATA: Inflammatory bowel disease.

EXAM:
CT ABDOMEN AND PELVIS WITHOUT CONTRAST
TECHNIQUE: Multidetector CT imaging of the abdomen and pelvis was performed
following the standard protocol without IV contrast.

[Series 3: a/p w/o 5mm · axial · non-contrast · 0.71mm/px · z∈[+770,+1200]mm · 13 of 94 slices shown, 15 images]
[im 4/94  soft-tissue]
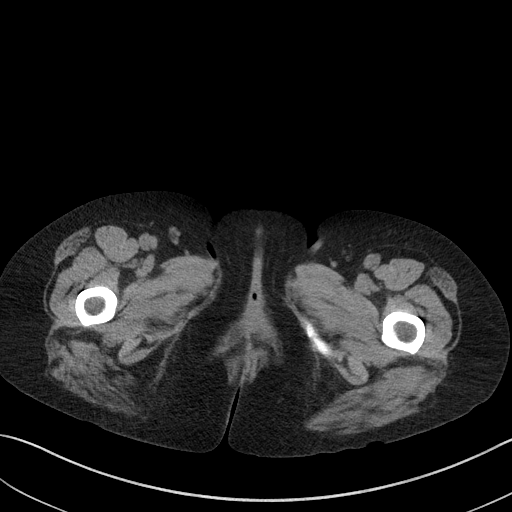
[im 4/94  bone]
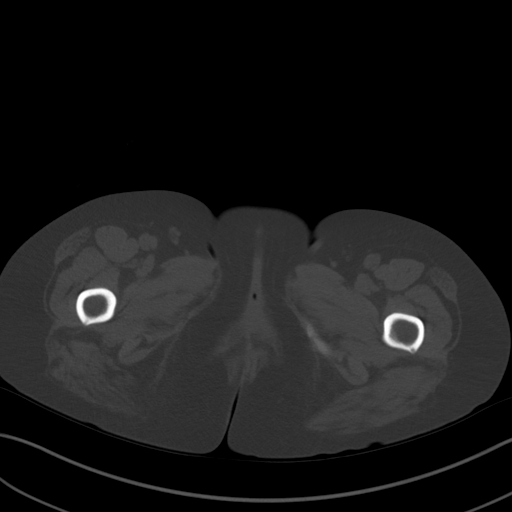
[im 12/94  soft-tissue]
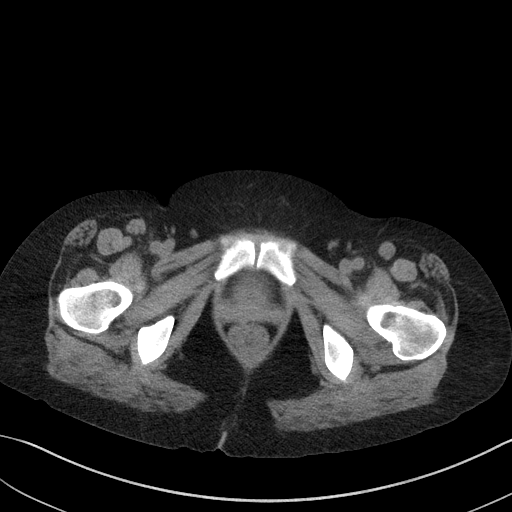
[im 19/94  soft-tissue]
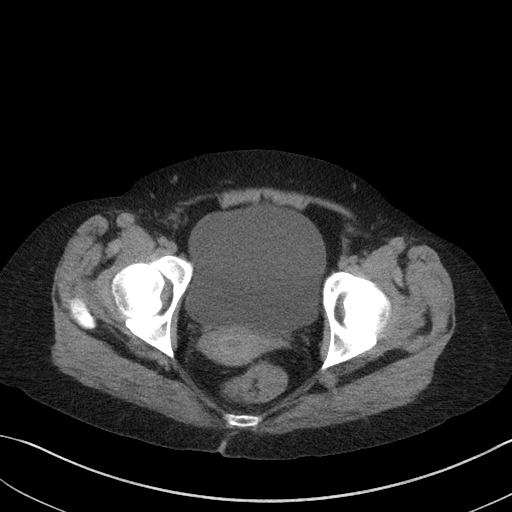
[im 27/94  soft-tissue]
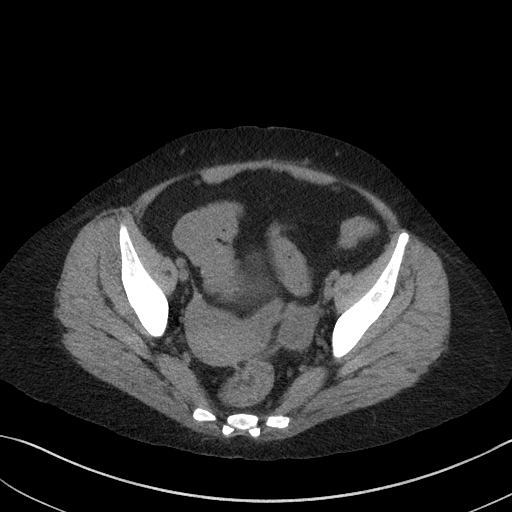
[im 34/94  soft-tissue]
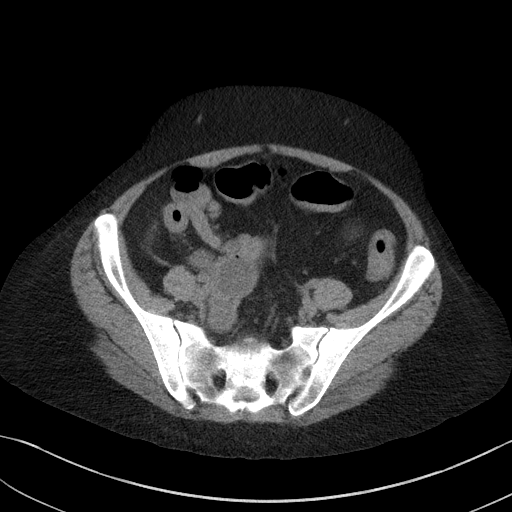
[im 41/94  soft-tissue]
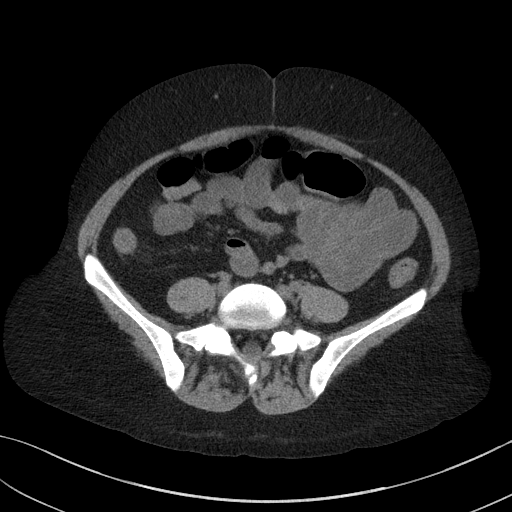
[im 49/94  soft-tissue]
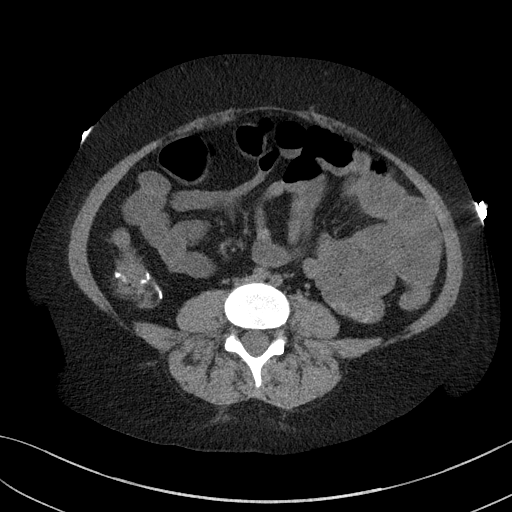
[im 53/94  soft-tissue]
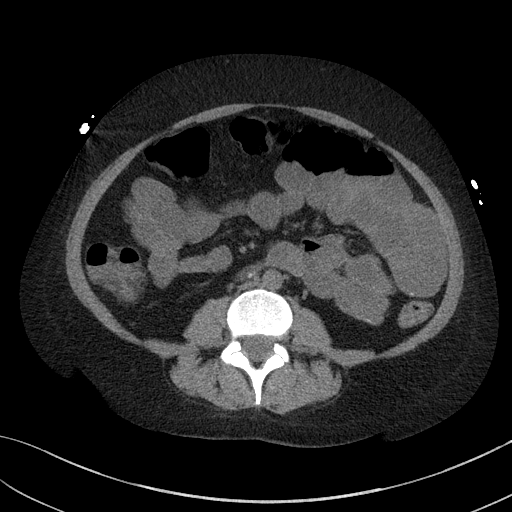
[im 60/94  soft-tissue]
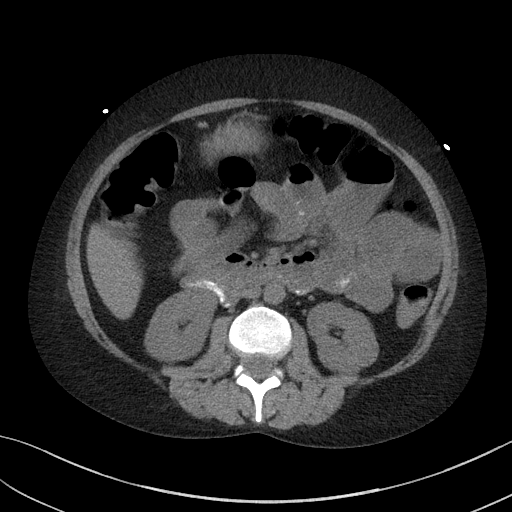
[im 60/94  bone]
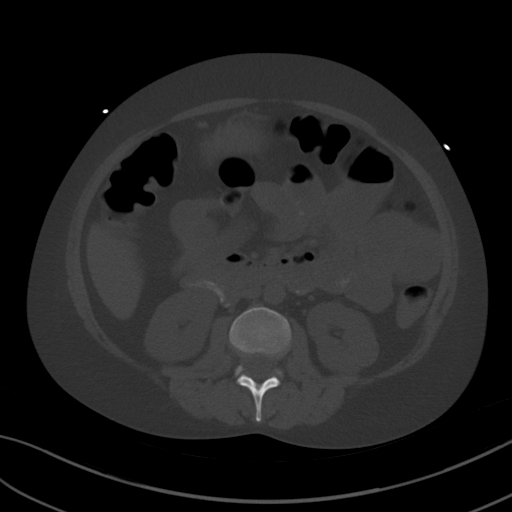
[im 67/94  soft-tissue]
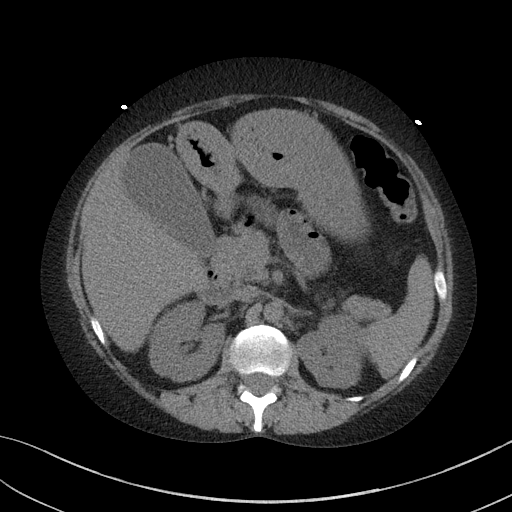
[im 75/94  soft-tissue]
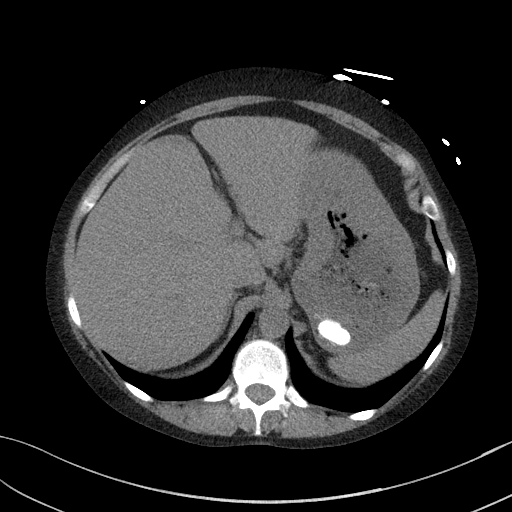
[im 82/94  soft-tissue]
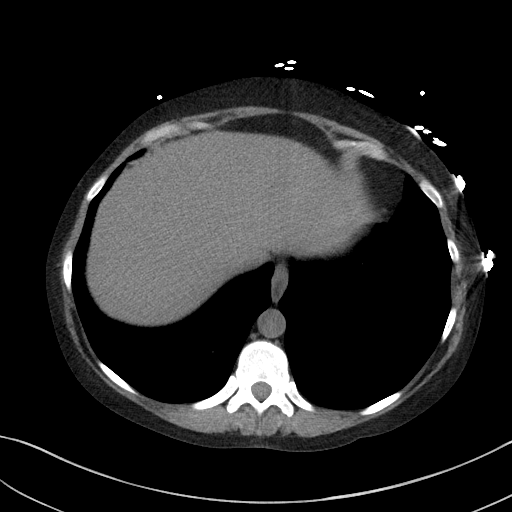
[im 90/94  soft-tissue]
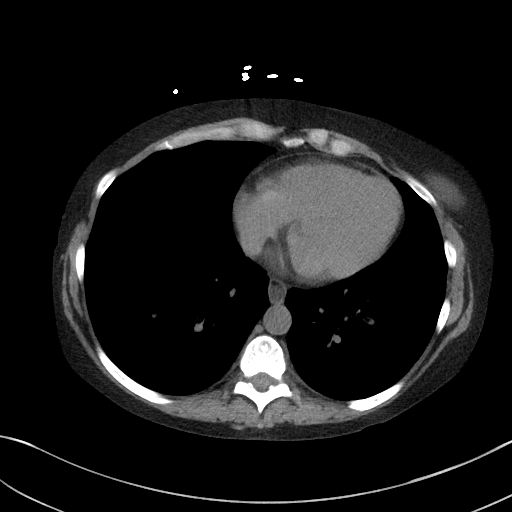

[Series 6: a/p w/o cor · coronal · non-contrast · 0.68mm/px · 3 of 141 slices shown]
[im 47/141  soft-tissue]
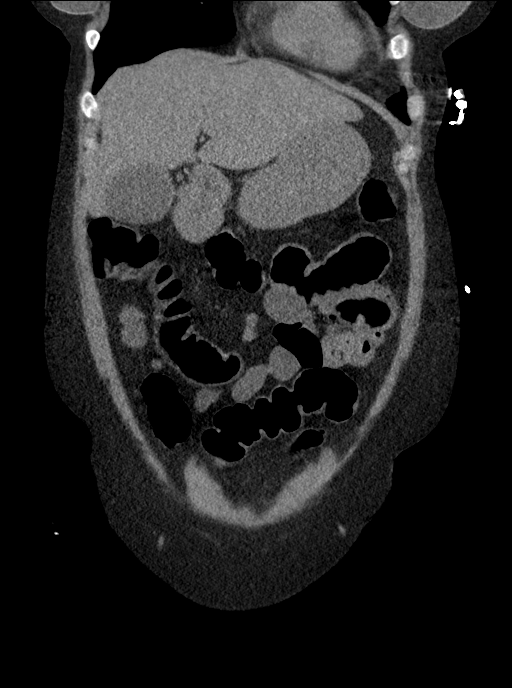
[im 63/141  soft-tissue]
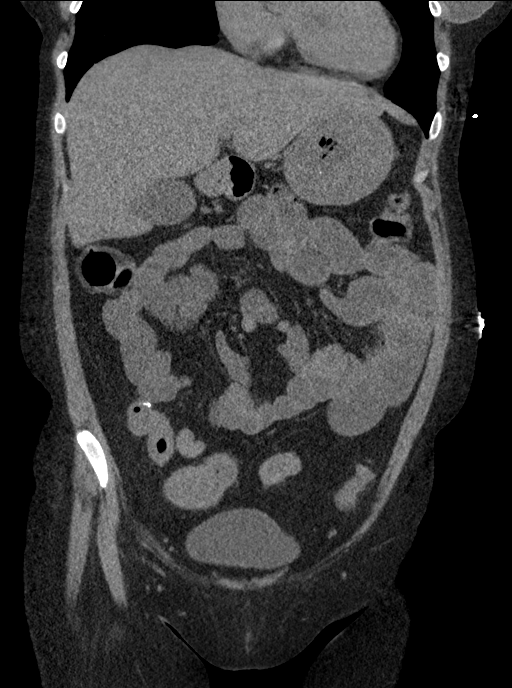
[im 78/141  soft-tissue]
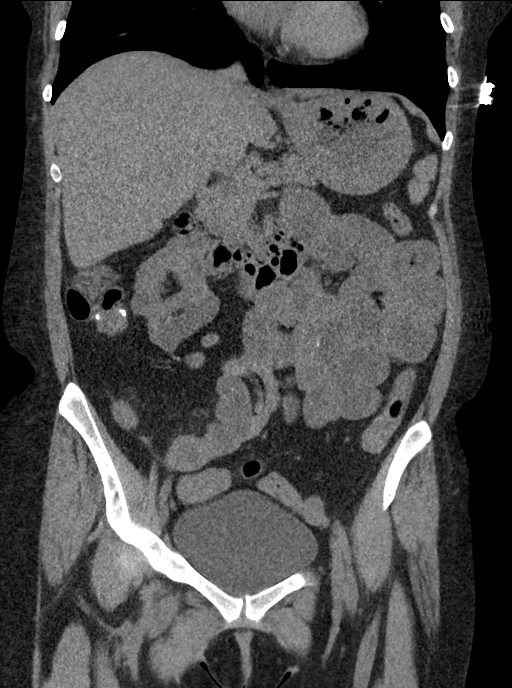

[16 of 46 positions shown; findings below may reference images not displayed]

FINDINGS: Lower chest: Lung bases are clear. Bilateral breast implants. Fluid
in the distal esophagus may indicate reflux or dysmotility.

Hepatobiliary: Gallbladder is somewhat distended without wall
thickening or inflammatory changes. No stones are identified. No
bile duct dilatation. No focal liver lesions.

Pancreas: Unremarkable. No pancreatic ductal dilatation or
surrounding inflammatory changes.

Spleen: Normal in size without focal abnormality.

Adrenals/Urinary Tract: Adrenal glands are unremarkable. Kidneys are
normal, without renal calculi, focal lesion, or hydronephrosis.
Bladder is unremarkable.

Stomach/Bowel: Hyperdense ingested material within the stomach.
Stomach, small bowel, and colon are mostly decompressed.
Postoperative changes in the right colon likely representing a
partial right hemicolectomy and ileocolonic anastomosis. No definite
bowel wall thickening although under distention limits evaluation.

Vascular/Lymphatic: No significant vascular findings are present. No
enlarged abdominal or pelvic lymph nodes.

Reproductive: Uterus and bilateral adnexa are unremarkable.

Other: No abdominal wall hernia or abnormality. No abdominopelvic
ascites.

Musculoskeletal: No acute or significant osseous findings.
IMPRESSION: No acute process demonstrated in the abdomen or pelvis. No evidence
of bowel obstruction or inflammation. No bowel wall thickening is
identified but under distention limits evaluation. Mild gallbladder
distention without inflammatory changes or stones, likely
physiologic.

## 2017-12-07 IMAGING — CT CT MAXILLOFACIAL W/O CM
3 series · 13 of 47 positions shown, 15 images · non-contrast
Comparison: [DATE] head CT

CLINICAL DATA: Bilateral nasal congestion with productive cough and
shortness of breath

EXAM:
CT MAXILLOFACIAL WITHOUT CONTRAST
TECHNIQUE: Multidetector CT imaging of the maxillofacial structures was
performed. Multiplanar CT image reconstructions were also generated.

[Series 3: sinus 2.0 h30s · axial · 0.37mm/px · z∈[-94,-20]mm · 7 of 46 slices shown, 9 images]
[im 5/46  brain]
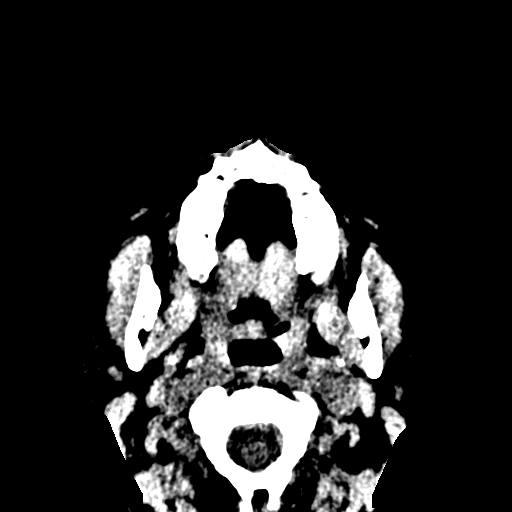
[im 5/46  bone]
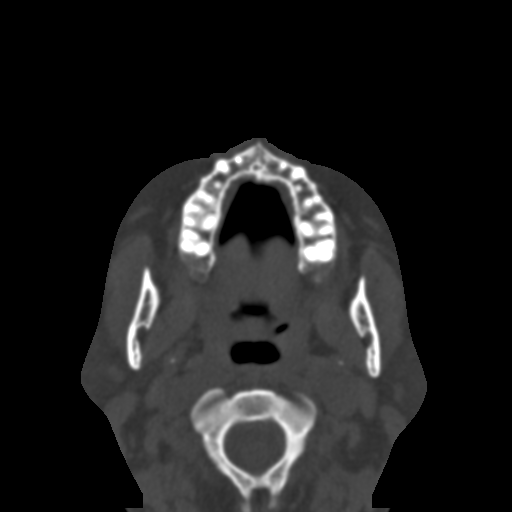
[im 11/46  bone]
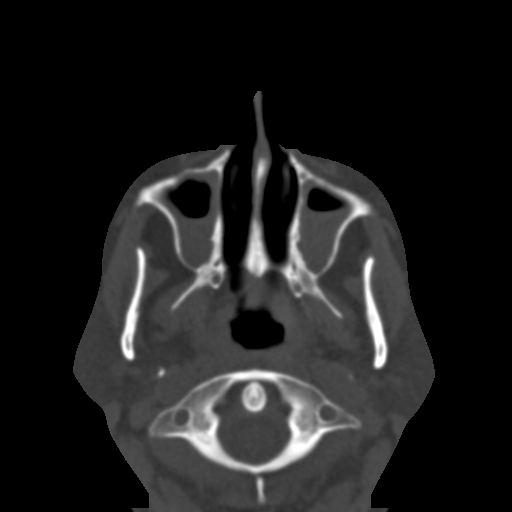
[im 18/46  bone]
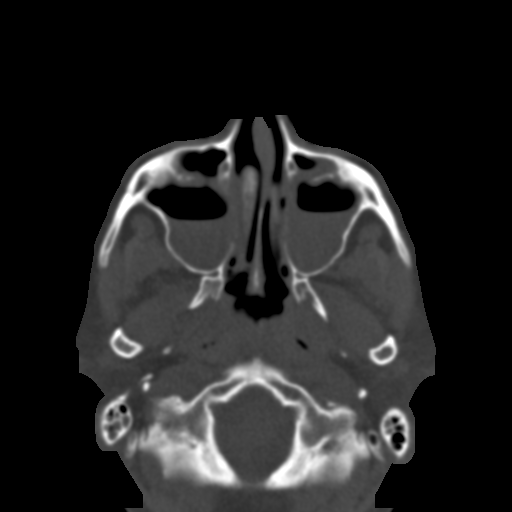
[im 24/46  bone]
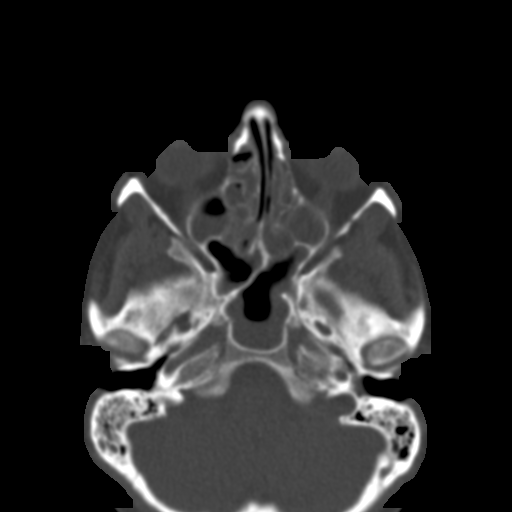
[im 30/46  brain]
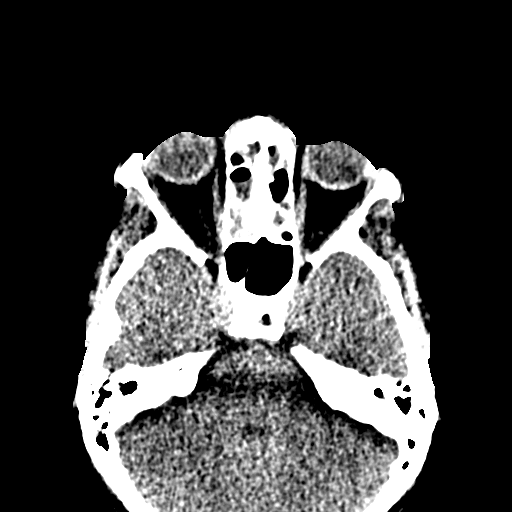
[im 30/46  bone]
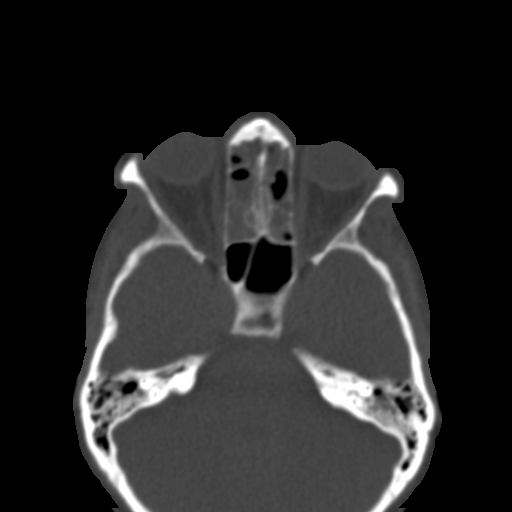
[im 36/46  bone]
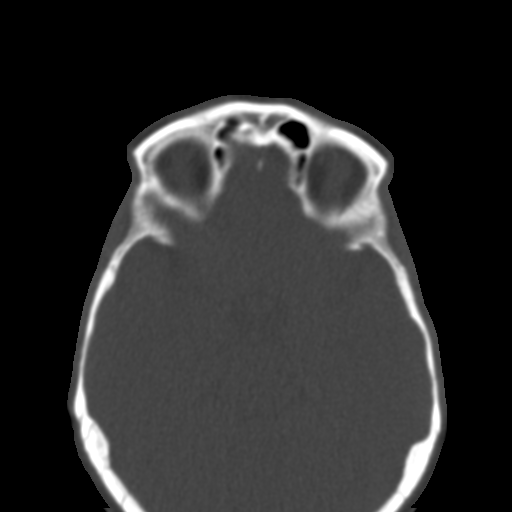
[im 42/46  bone]
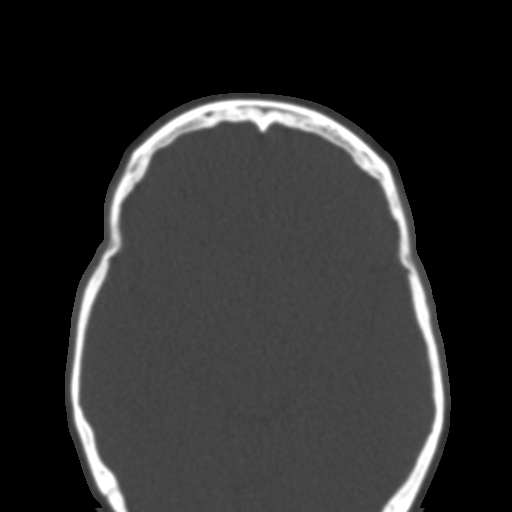

[Series 6: sinus 2.0 mpr cor · coronal · 0.18mm/px · 3 of 101 slices shown]
[im 34/101  bone]
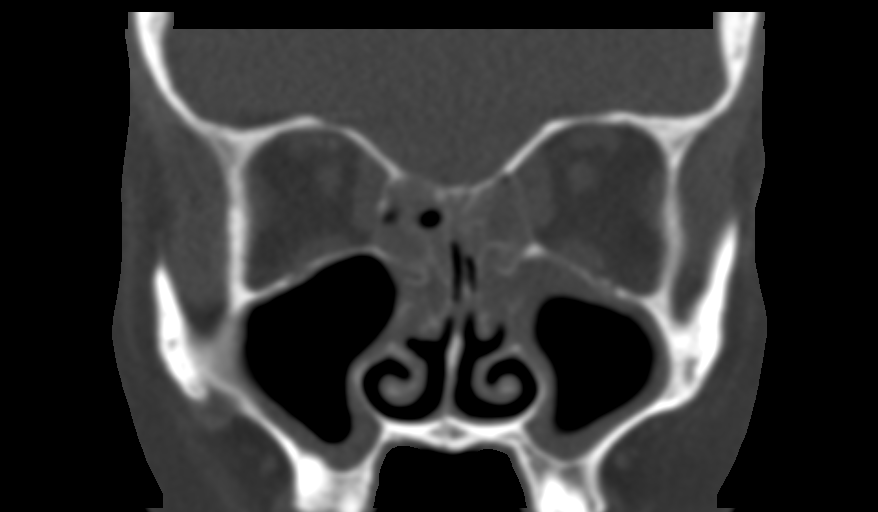
[im 45/101  bone]
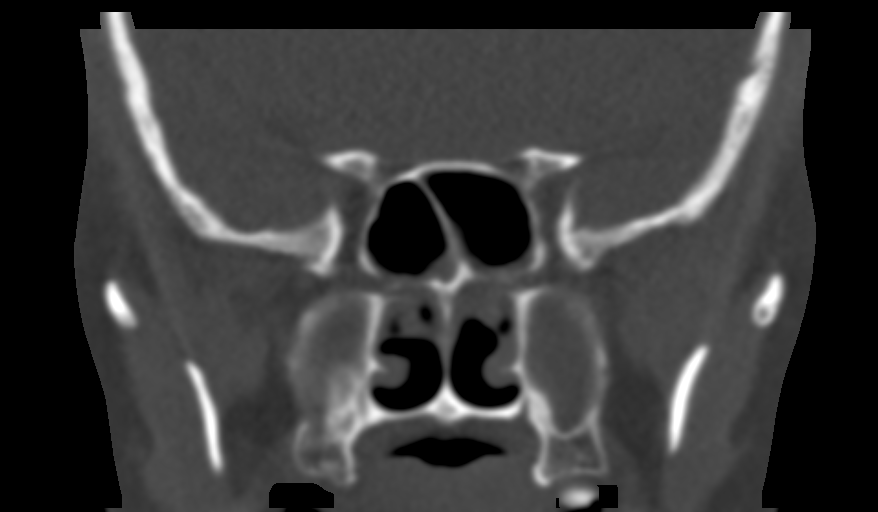
[im 56/101  bone]
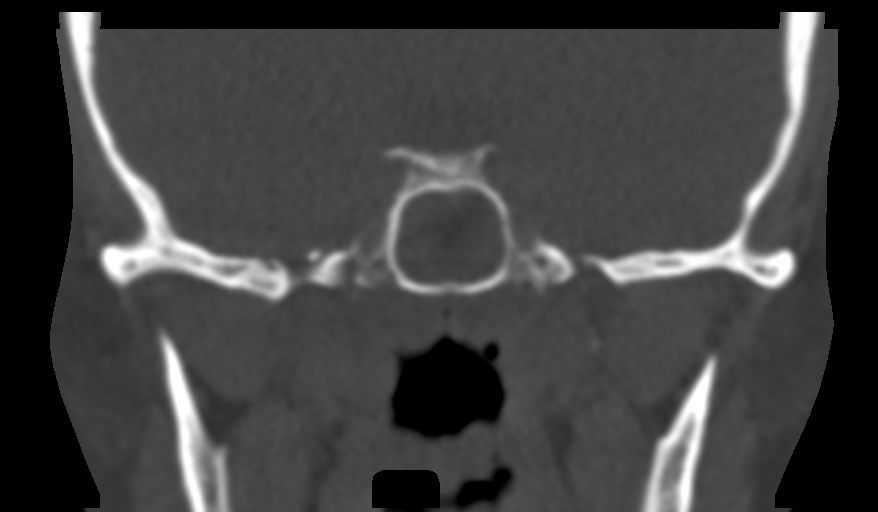

[Series 7: sinus 2.0 mpr sag · sagittal · 0.18mm/px · 3 of 72 slices shown]
[im 24/72  bone]
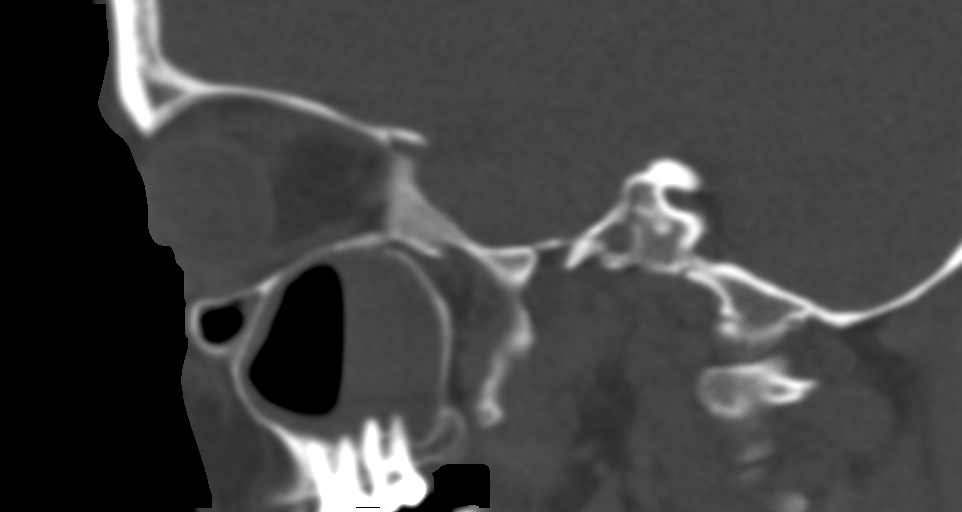
[im 36/72  bone]
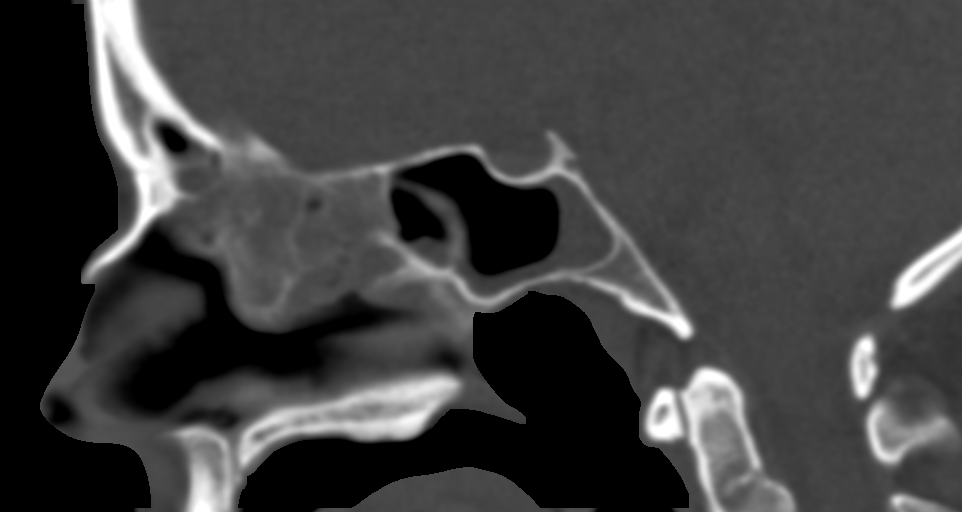
[im 48/72  bone]
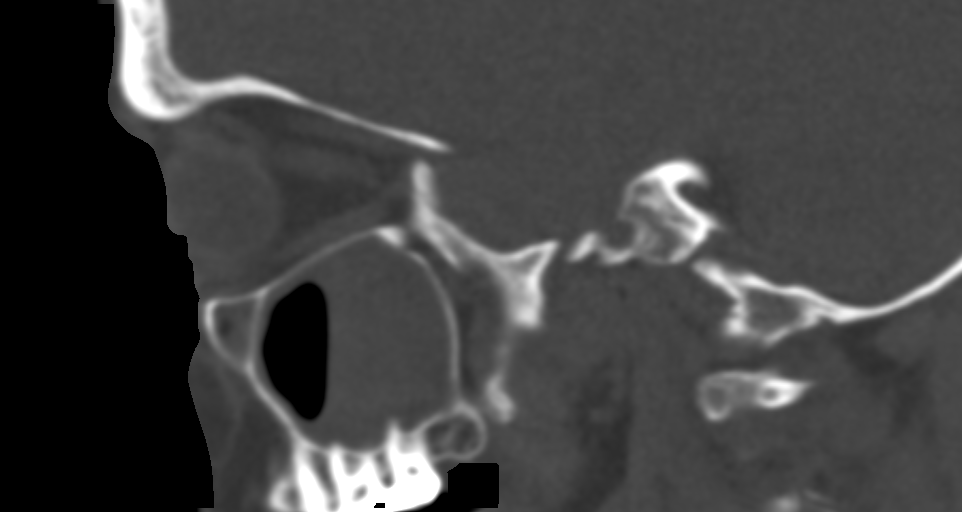

[13 of 47 positions shown; findings below may reference images not displayed]

FINDINGS: Osseous: Negative for fracture or destructive change.

Orbits: Negative. No traumatic or inflammatory finding.

Sinuses: Diffuse mucosal thickening with maxillary and sphenoid
sinus fluid levels. Both ostiomeatal units are opacified by mucosal
thickening. Bilateral mastoid subtotal opacification with negative
nasopharynx. There is left middle ear opacification. No erosive
changes.

Soft tissues: Negative

Limited intracranial: Negative
IMPRESSION: 1. Generalized acute sinusitis with fluid levels.
2. Bilateral mastoid and left middle ear opacification, possible
otomastoiditis.

## 2017-12-07 MED ORDER — MORPHINE SULFATE (PF) 4 MG/ML IV SOLN
2.0000 mg | INTRAVENOUS | Status: DC | PRN
  Administered 2017-12-07: 2 mg via INTRAVENOUS
  Filled 2017-12-07: qty 1

## 2017-12-07 MED ORDER — LEVALBUTEROL HCL 1.25 MG/0.5ML IN NEBU
1.2500 mg | INHALATION_SOLUTION | Freq: Four times a day (QID) | RESPIRATORY_TRACT | Status: DC
  Administered 2017-12-07 (×4): 1.25 mg via RESPIRATORY_TRACT
  Filled 2017-12-07 (×4): qty 0.5

## 2017-12-07 MED ORDER — ONDANSETRON HCL 4 MG/2ML IJ SOLN
4.0000 mg | Freq: Three times a day (TID) | INTRAMUSCULAR | Status: DC | PRN
  Administered 2017-12-08: 4 mg via INTRAVENOUS
  Filled 2017-12-07: qty 2

## 2017-12-07 MED ORDER — OXYCODONE-ACETAMINOPHEN 5-325 MG PO TABS
1.0000 | ORAL_TABLET | Freq: Four times a day (QID) | ORAL | Status: DC | PRN
  Administered 2017-12-07 (×3): 1 via ORAL
  Filled 2017-12-07 (×3): qty 1

## 2017-12-07 MED ORDER — POTASSIUM CHLORIDE CRYS ER 20 MEQ PO TBCR
40.0000 meq | EXTENDED_RELEASE_TABLET | Freq: Once | ORAL | Status: AC
  Administered 2017-12-07: 40 meq via ORAL
  Filled 2017-12-07: qty 2

## 2017-12-07 MED ORDER — ZOLPIDEM TARTRATE 5 MG PO TABS
5.0000 mg | ORAL_TABLET | Freq: Every evening | ORAL | Status: DC | PRN
  Administered 2017-12-08: 5 mg via ORAL
  Filled 2017-12-07: qty 1

## 2017-12-07 MED ORDER — POTASSIUM CHLORIDE CRYS ER 20 MEQ PO TBCR
40.0000 meq | EXTENDED_RELEASE_TABLET | ORAL | Status: AC
  Administered 2017-12-07 (×2): 40 meq via ORAL
  Filled 2017-12-07 (×2): qty 2

## 2017-12-07 MED ORDER — SODIUM CHLORIDE 0.9 % IV SOLN
1.0000 g | INTRAVENOUS | Status: DC
  Administered 2017-12-07: 1 g via INTRAVENOUS
  Filled 2017-12-07: qty 10

## 2017-12-07 MED ORDER — ACETAMINOPHEN 325 MG PO TABS
650.0000 mg | ORAL_TABLET | Freq: Four times a day (QID) | ORAL | Status: DC | PRN
  Administered 2017-12-09 – 2017-12-10 (×2): 650 mg via ORAL
  Filled 2017-12-07 (×3): qty 2

## 2017-12-07 MED ORDER — PANTOPRAZOLE SODIUM 40 MG PO TBEC
40.0000 mg | DELAYED_RELEASE_TABLET | Freq: Two times a day (BID) | ORAL | Status: DC
Start: 2017-12-07 — End: 2017-12-09
  Administered 2017-12-07 – 2017-12-09 (×5): 40 mg via ORAL
  Filled 2017-12-07 (×5): qty 1

## 2017-12-07 MED ORDER — NICOTINE 21 MG/24HR TD PT24
21.0000 mg | MEDICATED_PATCH | Freq: Every day | TRANSDERMAL | Status: DC
  Administered 2017-12-07: 21 mg via TRANSDERMAL
  Filled 2017-12-07 (×2): qty 1

## 2017-12-07 MED ORDER — POTASSIUM CHLORIDE 10 MEQ/100ML IV SOLN
10.0000 meq | INTRAVENOUS | Status: AC
  Administered 2017-12-07 (×4): 10 meq via INTRAVENOUS
  Filled 2017-12-07 (×4): qty 100

## 2017-12-07 MED ORDER — SODIUM CHLORIDE 0.9 % IV SOLN
2.0000 g | Freq: Once | INTRAVENOUS | Status: AC
  Administered 2017-12-07: 2 g via INTRAVENOUS
  Filled 2017-12-07: qty 20

## 2017-12-07 MED ORDER — SODIUM CHLORIDE 0.9 % IV SOLN
500.0000 mg | INTRAVENOUS | Status: DC
  Administered 2017-12-07: 500 mg via INTRAVENOUS
  Filled 2017-12-07: qty 500

## 2017-12-07 MED ORDER — IPRATROPIUM BROMIDE 0.02 % IN SOLN
0.5000 mg | RESPIRATORY_TRACT | Status: DC
  Administered 2017-12-07 (×2): 0.5 mg via RESPIRATORY_TRACT
  Filled 2017-12-07 (×2): qty 2.5

## 2017-12-07 MED ORDER — ENOXAPARIN SODIUM 40 MG/0.4ML ~~LOC~~ SOLN
40.0000 mg | SUBCUTANEOUS | Status: DC
  Administered 2017-12-07: 40 mg via SUBCUTANEOUS
  Filled 2017-12-07 (×2): qty 0.4

## 2017-12-07 MED ORDER — SODIUM CHLORIDE 0.9 % IV SOLN
INTRAVENOUS | Status: DC
  Administered 2017-12-07: 09:00:00 via INTRAVENOUS

## 2017-12-07 MED ORDER — DM-GUAIFENESIN ER 30-600 MG PO TB12
1.0000 | ORAL_TABLET | Freq: Two times a day (BID) | ORAL | Status: DC | PRN
  Administered 2017-12-07 (×2): 1 via ORAL
  Filled 2017-12-07 (×2): qty 1

## 2017-12-07 MED ORDER — WHITE PETROLATUM EX OINT
TOPICAL_OINTMENT | CUTANEOUS | Status: AC
  Administered 2017-12-07: 0.2
  Filled 2017-12-07: qty 28.35

## 2017-12-07 MED ORDER — IPRATROPIUM BROMIDE 0.02 % IN SOLN
0.5000 mg | Freq: Four times a day (QID) | RESPIRATORY_TRACT | Status: DC
  Administered 2017-12-07 (×2): 0.5 mg via RESPIRATORY_TRACT
  Filled 2017-12-07 (×2): qty 2.5

## 2017-12-07 MED ORDER — METOPROLOL SUCCINATE ER 100 MG PO TB24
100.0000 mg | ORAL_TABLET | Freq: Every day | ORAL | Status: DC
  Administered 2017-12-07: 100 mg via ORAL
  Filled 2017-12-07: qty 1

## 2017-12-07 MED ORDER — OXYCODONE-ACETAMINOPHEN 5-325 MG PO TABS
1.0000 | ORAL_TABLET | Freq: Once | ORAL | Status: AC
  Administered 2017-12-07: 1 via ORAL
  Filled 2017-12-07: qty 1

## 2017-12-07 MED ORDER — MAGNESIUM OXIDE 400 (241.3 MG) MG PO TABS
400.0000 mg | ORAL_TABLET | Freq: Two times a day (BID) | ORAL | Status: DC
  Administered 2017-12-07 – 2017-12-11 (×10): 400 mg via ORAL
  Filled 2017-12-07 (×12): qty 1

## 2017-12-07 MED ORDER — METOPROLOL SUCCINATE ER 100 MG PO TB24: 100.0000 mg | ORAL_TABLET | Freq: Every day | ORAL | Status: DC

## 2017-12-07 MED ORDER — METHYLPREDNISOLONE SODIUM SUCC 125 MG IJ SOLR
60.0000 mg | Freq: Three times a day (TID) | INTRAMUSCULAR | Status: DC
  Administered 2017-12-07 – 2017-12-08 (×4): 60 mg via INTRAVENOUS
  Filled 2017-12-07 (×4): qty 2

## 2017-12-07 MED ORDER — HYDRALAZINE HCL 20 MG/ML IJ SOLN: 5.0000 mg | INTRAMUSCULAR | Status: DC | PRN

## 2017-12-07 MED ORDER — MAGNESIUM SULFATE 2 GM/50ML IV SOLN
2.0000 g | Freq: Once | INTRAVENOUS | Status: AC
  Administered 2017-12-07: 2 g via INTRAVENOUS
  Filled 2017-12-07: qty 50

## 2017-12-07 MED ORDER — OXYCODONE-ACETAMINOPHEN 5-325 MG PO TABS
1.0000 | ORAL_TABLET | Freq: Four times a day (QID) | ORAL | Status: DC | PRN
  Administered 2017-12-07: 2 via ORAL
  Filled 2017-12-07: qty 2

## 2017-12-07 MED ORDER — ALPRAZOLAM 0.5 MG PO TABS
1.0000 mg | ORAL_TABLET | Freq: Three times a day (TID) | ORAL | Status: DC | PRN
  Administered 2017-12-07 – 2017-12-08 (×3): 1 mg via ORAL
  Filled 2017-12-07 (×3): qty 2

## 2017-12-07 NOTE — ED Notes (Signed)
RN found pt out of bed.  She appeared to be SOB, RN assisted back to bed, found pt heart rate of 116. Requested that pt call us when she needed to get out of bed, explained safety measures.  Will continue to monitor.

## 2017-12-07 NOTE — ED Notes (Signed)
Walked patient to the bathroom patient did well 

## 2017-12-07 NOTE — ED Notes (Signed)
6N called to follow-up Patient has received all AM meds and ready to be transported from [facility]

## 2017-12-07 NOTE — ED Notes (Signed)
RN requested that sec. Page provider

## 2017-12-07 NOTE — Progress Notes (Signed)
CRITICAL VALUE ALERT  Critical Value:  Potassium 2.4  Date & Time Notied:  12/07/2017 12 PM  Provider Notified: Dr. Teryl Lucy paged   Orders Received/Actions taken: Pt still getting her 4 run of Potassium, lab drawn early However, the Stat Latic Acid was not drawn - Phleb. Notified.

## 2017-12-07 NOTE — ED Notes (Signed)
Patient ambulated to the bathroom and back-Standby assist

## 2017-12-07 NOTE — ED Notes (Signed)
Dr. Tamala Julian call back, ordered potassium 40mg  po, 4 runs of IV potassium, 2g calicum Glucinate,

## 2017-12-07 NOTE — ED Notes (Signed)
Patient transported to CT scan . 

## 2017-12-07 NOTE — ED Notes (Signed)
Pt appears very anxious, slight tremors in her hands, inability to sit still.  Pt denied any recent drug or alcohol usage.

## 2017-12-07 NOTE — Progress Notes (Signed)
Pt told charge RN that the pain medication is not controlling her pain.  I paged Dr. Lonny Prude and advised her.

## 2017-12-07 NOTE — Progress Notes (Signed)
PROGRESS NOTE  [name] OBS:[id number] DOB: 1980/11/01 DOA: 12/06/2017 PCP: Patient, [name]  HPI  [name] is a 37 y.o. year old female with medical history significant for Crohn's disease, hypertension, otitis media, GERD, anxiety, migraine headache, fibromyalgia, endometriosis, tobacco abuse, IV drug use, who presented on 12/06/2017 with one week of sore throat, hearing loss, ear pain, nausea/vomiting, generalized malaise and was found to have hypocalcemia, hypomagnesemia and sepsis from likely bronchitis/COPD.  Interval History    ROS: sore throat, headache, sinus tenderness, generalized abdominal pain    Subjective Less tingling in face. Generalized abdominal pain. Chronic diarrhea  Assessment/Plan: Principal Problem:   Bronchitis Active Problems:   Anxiety state   Essential hypertension   GERD   CROHN'S DISEASE-SMALL INTESTINE   Abdominal pain, generalized   Hypokalemia   Hypocalcemia   Hypomagnesemia   Sepsis ([place])   AKI (acute kidney injury) ([place])   # Sepsis with likely respiratory source ( bronchitis or sinusitis?). Sepsis criteria HR 106, BP 122/106-144/100, and WBC of 36.2. CT abd w/no acute intrabdominal process. Normal CXR with cough in smoker concerning for COPD but no wheezing heard. Significant sinus tenderness and ENT symptoms likely sinusitis. Flu and S.pneumo negative - empiric Azithromycin, ceftriaxone, scheduled duonebs, IV Solumedrol, f/u blood cultures,  legionella, procalcitonin, RVP, sputum culture. Obtain CT sinus  #Nausea/Vomiting,  Unclear etiology likely related to presumed viral prodrome. Does have generalized abdominal pain, CT abdomen with no acute pathology. Lipase wnl. LFTs wnl.   #Symptomatic hypocalcemia, suspect secondary to hypomagnesemia which she states is a chronic problem related to her diarrhea from Crohn's.  Ca on admission 5. s/p IV Calcium gluconate. Likely related to hypomagnesemia-->PTH resistance-->hypocalcemia.  Check PTH, repeat Ca in afternoon.   #Hypokalemia and hypomagnesemia, likely from GI losses from chronic diarrhea and poor oral intake to replete. K 2--2.3 , Mg 0.4--> 2.2. Improving with combined IV and PO supplementation. Repeat BMP this afternoon. Denies Etoh or ingestion.  #AKI, suspect prerenal from GI losses. Give IVF, repeat BMP. Peak Cr 1.95, baseline 0.5-0.8. Obtain FeNa, Ua, monitor BMP. If no improvement renal ultrasound  #Lactic acidosis, unclear etiology. No abdominal pain. No noted hypotension. 1.5--2.7. Repeat in 3 hours after gentle fluid resuscitation  #Ear pain/hearing loss. Likely related to viral sinusitis based on constellation of URI symptoms. CT sinus.   #Elevated HCG. Initially 46.3 in ED but on repeat hcg quantitative 3   #Hypertension. Elevated diastolic pressure in the 476L. Continue metoprolol  #GERD, stable. Continue protonix  #Crohn's disease. No inflammation on CT abdomen. Gi symptoms likely viral ? Obtain GIP/C. diff pathogen if still having diarrhea   Code Status: full code   Family Communication: none at bedside   Disposition Plan: normalization of electrolytes, resolution of sepsis/infection   Consultants:  none  Procedures:  none   Antimicrobials:  Azithromycin: 5/1  Ceftriaxone: 5/1  Cultures:  Blood x 2 (5/1)  Telemetry: yes  DVT prophylaxis: Lovenox   Objective: Vitals:   12/07/17 0500 12/07/17 0530 12/07/17 0600 12/07/17 0724  BP: (!) 139/100 128/87    Pulse:   (!) 108 (!) 106  Resp: 14 17 16 19   Temp:      TempSrc:      SpO2: 100%  98% 100%  Weight:      Height:        Intake/Output Summary (Last 24 hours) at 12/07/2017 0731 Last data filed at 12/07/2017 0233 Gross per 24 hour  Intake 5300 ml  Output 0  ml  Net 5300 ml   Filed Weights   12/06/17 1837  Weight: 59 kg (130 lb)    Exam:  Constitutional: ill appearing female, in no distress but visibly uncomfortable Eyes: EOMI, anicteric, normal conjunctivae,  swollen upper eyelids ENMT: Oropharynx with moist mucous membranes, normal dentition, teleganectasia on cheeks, maxillary sinus tenderness, decreased hearing L>R( finger rub test), erythema around nasal opening Neck: FROM, no thyromegaly Cardiovascular: tachycardic, regular rate no MRGs, with no peripheral edema Respiratory: Normal respiratory effort on room air, clear breath sounds  Abdomen: Soft, mild tenderness in all quadrants, negative murphy's sign, no rebound tenderness or guarding Skin: No rash ulcers, or lesions. Without skin tenting  Neurologic: Grossly no focal neuro deficit. Psychiatric:Appropriate affect, and mood. Mental status AAOx3  Data Reviewed: CBC: Recent Labs  Lab 12/06/17 1957  WBC 36.2*  NEUTROABS 31.1*  HGB 10.9*  HCT 33.4*  MCV 76.8*  PLT 767*   Basic Metabolic Panel: Recent Labs  Lab 12/06/17 1957 12/07/17 0445  NA 140 138  K 2.0* 2.3*  CL 112* 114*  CO2 14* 10*  GLUCOSE 108* 199*  BUN 13 12  CREATININE 1.94* 1.89*  CALCIUM 5.0* 5.8*  MG 0.4* 2.2   GFR: Estimated Creatinine Clearance: 33.3 mL/min (A) (by C-G formula based on SCr of 1.89 mg/dL (H)). Liver Function Tests: Recent Labs  Lab 12/06/17 1957  AST 14*  ALT 8*  ALKPHOS 155*  BILITOT 1.0  PROT 5.8*  ALBUMIN 2.2*   Recent Labs  Lab 12/06/17 1957  LIPASE 18   No results for input(s): AMMONIA in the last 168 hours. Coagulation Profile: No results for input(s): INR, PROTIME in the last 168 hours. Cardiac Enzymes: No results for input(s): CKTOTAL, CKMB, CKMBINDEX, TROPONINI in the last 168 hours. BNP (last 3 results) No results for input(s): PROBNP in the last 8760 hours. HbA1C: No results for input(s): HGBA1C in the last 72 hours. CBG: No results for input(s): GLUCAP in the last 168 hours. Lipid Profile: No results for input(s): CHOL, HDL, LDLCALC, TRIG, CHOLHDL, LDLDIRECT in the last 72 hours. Thyroid Function Tests: No results for input(s): TSH, T4TOTAL, FREET4,  T3FREE, THYROIDAB in the last 72 hours. Anemia Panel: No results for input(s): VITAMINB12, FOLATE, FERRITIN, TIBC, IRON, RETICCTPCT in the last 72 hours. Urine analysis:    Component Value Date/Time   COLORURINE YELLOW 09/27/2017 1540   APPEARANCEUR HAZY (A) 09/27/2017 1540   LABSPEC 1.016 09/27/2017 1540   PHURINE 5.0 09/27/2017 1540   GLUCOSEU NEGATIVE 09/27/2017 1540   HGBUR NEGATIVE 09/27/2017 1540   BILIRUBINUR NEGATIVE 09/27/2017 1540   KETONESUR NEGATIVE 09/27/2017 1540   PROTEINUR NEGATIVE 09/27/2017 1540   UROBILINOGEN 0.2 10/22/2010 0038   NITRITE NEGATIVE 09/27/2017 1540   LEUKOCYTESUR NEGATIVE 09/27/2017 1540   Sepsis Labs: @LABRCNTIP (procalcitonin:4,lacticidven:4)  )No results found for this or any previous visit (from the past 240 hour(s)).    Studies: Ct Abdomen Pelvis Wo Contrast  Result Date: 12/07/2017 CLINICAL DATA:  Inflammatory bowel disease. EXAM: CT ABDOMEN AND PELVIS WITHOUT CONTRAST TECHNIQUE: Multidetector CT imaging of the abdomen and pelvis was performed following the standard protocol without IV contrast. COMPARISON:  09/28/2017 FINDINGS: Lower chest: Lung bases are clear. Bilateral breast implants. Fluid in the distal esophagus may indicate reflux or dysmotility. Hepatobiliary: Gallbladder is somewhat distended without wall thickening or inflammatory changes. No stones are identified. No bile duct dilatation. No focal liver lesions. Pancreas: Unremarkable. No pancreatic ductal dilatation or surrounding inflammatory changes. Spleen: Normal in size without focal abnormality.  Adrenals/Urinary Tract: Adrenal glands are unremarkable. Kidneys are normal, without renal calculi, focal lesion, or hydronephrosis. Bladder is unremarkable. Stomach/Bowel: Hyperdense ingested material within the stomach. Stomach, small bowel, and colon are mostly decompressed. Postoperative changes in the right colon likely representing a partial right hemicolectomy and ileocolonic  anastomosis. No definite bowel wall thickening although under distention limits evaluation. Vascular/Lymphatic: No significant vascular findings are present. No enlarged abdominal or pelvic lymph nodes. Reproductive: Uterus and bilateral adnexa are unremarkable. Other: No abdominal wall hernia or abnormality. No abdominopelvic ascites. Musculoskeletal: No acute or significant osseous findings. IMPRESSION: No acute process demonstrated in the abdomen or pelvis. No evidence of bowel obstruction or inflammation. No bowel wall thickening is identified but under distention limits evaluation. Mild gallbladder distention without inflammatory changes or stones, likely physiologic. Electronically Signed   By: Lucienne Capers M.D.   On: 12/07/2017 00:46   Dg Chest 2 View  Result Date: 12/06/2017 CLINICAL DATA:  Cough, shortness of breath, chest pain EXAM: CHEST - 2 VIEW COMPARISON:  08/13/2017 FINDINGS: Lungs are clear.  No pleural effusion or pneumothorax. The heart is normal in size. Visualized osseous structures are within normal limits. IMPRESSION: Normal chest radiographs. Electronically Signed   By: Julian Hy M.D.   On: 12/06/2017 20:07    Scheduled Meds: . enoxaparin (LOVENOX) injection  40 mg Subcutaneous Q24H  . ipratropium  0.5 mg Nebulization Q4H  . levalbuterol  1.25 mg Nebulization Q6H  . magnesium oxide  400 mg Oral BID  . methylPREDNISolone (SOLU-MEDROL) injection  60 mg Intravenous TID  . metoprolol succinate  100 mg Oral Daily  . nicotine  21 mg Transdermal Daily  . pantoprazole  40 mg Oral BID  . potassium chloride  40 mEq Oral Once    Continuous Infusions: . sodium chloride 100 mL/hr at 12/07/17 0044  . azithromycin    . calcium chloride    . cefTRIAXone (ROCEPHIN)  IV    . potassium chloride       LOS: 0 days     Desiree Hane, MD [facility] Pager [phone]  If 7PM-7AM, please contact night-coverage [email or web address] Password [facility] 12/07/2017, 7:31 AM

## 2017-12-07 NOTE — ED Notes (Signed)
  Dr. Blaine Hamper ( admittiing MD) notified that RN is unable to collect 2nd blood culture and admission blood tests despite several attempts due to poorly visible and minute veins .

## 2017-12-08 ENCOUNTER — Inpatient Hospital Stay (HOSPITAL_COMMUNITY): Payer: Medicaid Other

## 2017-12-08 DIAGNOSIS — J80 Acute respiratory distress syndrome: Secondary | ICD-10-CM

## 2017-12-08 DIAGNOSIS — N179 Acute kidney failure, unspecified: Secondary | ICD-10-CM

## 2017-12-08 DIAGNOSIS — A419 Sepsis, unspecified organism: Principal | ICD-10-CM

## 2017-12-08 DIAGNOSIS — R451 Restlessness and agitation: Secondary | ICD-10-CM

## 2017-12-08 DIAGNOSIS — J189 Pneumonia, unspecified organism: Secondary | ICD-10-CM

## 2017-12-08 DIAGNOSIS — G9340 Encephalopathy, unspecified: Secondary | ICD-10-CM

## 2017-12-08 DIAGNOSIS — J9601 Acute respiratory failure with hypoxia: Secondary | ICD-10-CM | POA: Diagnosis present

## 2017-12-08 LAB — RESPIRATORY PANEL BY PCR

## 2017-12-08 LAB — MAGNESIUM
Magnesium: 1.5 mg/dL — ABNORMAL LOW (ref 1.7–2.4)
Magnesium: 1.8 mg/dL (ref 1.7–2.4)
Magnesium: 2.1 mg/dL (ref 1.7–2.4)

## 2017-12-08 LAB — POCT I-STAT 3, ART BLOOD GAS (G3+)
Acid-base deficit: 20 mmol/L — ABNORMAL HIGH (ref 0.0–2.0)
Acid-base deficit: 12 mmol/L — ABNORMAL HIGH (ref 0.0–2.0)
Acid-base deficit: 12 mmol/L — ABNORMAL HIGH (ref 0.0–2.0)
Acid-base deficit: 9 mmol/L — ABNORMAL HIGH (ref 0.0–2.0)
Acid-base deficit: 4 mmol/L — ABNORMAL HIGH (ref 0.0–2.0)
Bicarbonate: 10.6 mmol/L — ABNORMAL LOW (ref 20.0–28.0)
Bicarbonate: 15.6 mmol/L — ABNORMAL LOW (ref 20.0–28.0)
Bicarbonate: 15.3 mmol/L — ABNORMAL LOW (ref 20.0–28.0)
Bicarbonate: 18.8 mmol/L — ABNORMAL LOW (ref 20.0–28.0)
Bicarbonate: 21.3 mmol/L (ref 20.0–28.0)
O2 Saturation: 84 %
O2 Saturation: 92 %
O2 Saturation: 87 %
O2 Saturation: 97 %
O2 Saturation: 97 %
Patient temperature: 97.4
Patient temperature: 97.4
Patient temperature: 97.4
Patient temperature: 98.6
Patient temperature: 97.1
TCO2: 12 mmol/L — ABNORMAL LOW (ref 22–32)
TCO2: 17 mmol/L — ABNORMAL LOW (ref 22–32)
TCO2: 16 mmol/L — ABNORMAL LOW (ref 22–32)
TCO2: 20 mmol/L — ABNORMAL LOW (ref 22–32)
TCO2: 22 mmol/L (ref 22–32)
pCO2 arterial: 40.8 mmHg (ref 32.0–48.0)
pCO2 arterial: 39 mmHg (ref 32.0–48.0)
pCO2 arterial: 39.4 mmHg (ref 32.0–48.0)
pCO2 arterial: 50 mmHg — ABNORMAL HIGH (ref 32.0–48.0)
pCO2 arterial: 35.9 mmHg (ref 32.0–48.0)
pH, Arterial: 7.017 — CL (ref 7.350–7.450)
pH, Arterial: 7.207 — ABNORMAL LOW (ref 7.350–7.450)
pH, Arterial: 7.193 — CL (ref 7.350–7.450)
pH, Arterial: 7.183 — CL (ref 7.350–7.450)
pH, Arterial: 7.377 (ref 7.350–7.450)
pO2, Arterial: 69 mmHg — ABNORMAL LOW (ref 83.0–108.0)
pO2, Arterial: 75 mmHg — ABNORMAL LOW (ref 83.0–108.0)
pO2, Arterial: 63 mmHg — ABNORMAL LOW (ref 83.0–108.0)
pO2, Arterial: 115 mmHg — ABNORMAL HIGH (ref 83.0–108.0)
pO2, Arterial: 92 mmHg (ref 83.0–108.0)

## 2017-12-08 LAB — BASIC METABOLIC PANEL
Anion gap: 8 (ref 5–15)
BUN: 11 mg/dL (ref 6–20)
CO2: 20 mmol/L — ABNORMAL LOW (ref 22–32)
Calcium: 6.6 mg/dL — ABNORMAL LOW (ref 8.9–10.3)
Chloride: 121 mmol/L — ABNORMAL HIGH (ref 101–111)
Creatinine, Ser: 1.3 mg/dL — ABNORMAL HIGH (ref 0.44–1.00)
GFR calc Af Amer: >60 mL/min (ref >60)
GFR calc non Af Amer: 52 mL/min — ABNORMAL LOW (ref >60)
Glucose, Bld: 144 mg/dL — ABNORMAL HIGH (ref 65–99)
Potassium: 3.7 mmol/L (ref 3.5–5.1)
Sodium: 149 mmol/L — ABNORMAL HIGH (ref 135–145)

## 2017-12-08 LAB — TRIGLYCERIDES: Triglycerides: 75 mg/dL (ref <150)

## 2017-12-08 LAB — BRAIN NATRIURETIC PEPTIDE: B Natriuretic Peptide: 736.2 pg/mL — ABNORMAL HIGH (ref 0.0–100.0)

## 2017-12-08 LAB — PHOSPHORUS
Phosphorus: 4.3 mg/dL (ref 2.5–4.6)
Phosphorus: 3.4 mg/dL (ref 2.5–4.6)
Phosphorus: 4.5 mg/dL (ref 2.5–4.6)

## 2017-12-08 LAB — TROPONIN I
Troponin I: 0.31 ng/mL (ref <0.03)
Troponin I: <0.03 ng/mL (ref <0.03)

## 2017-12-08 LAB — GLUCOSE, CAPILLARY
Glucose-Capillary: 151 mg/dL — ABNORMAL HIGH (ref 65–99)
Glucose-Capillary: 112 mg/dL — ABNORMAL HIGH (ref 65–99)
Glucose-Capillary: 137 mg/dL — ABNORMAL HIGH (ref 65–99)
Glucose-Capillary: 163 mg/dL — ABNORMAL HIGH (ref 65–99)
Glucose-Capillary: 147 mg/dL — ABNORMAL HIGH (ref 65–99)

## 2017-12-08 LAB — CBC WITH DIFFERENTIAL/PLATELET
Basophils Absolute: 0 10*3/uL (ref 0.0–0.1)
Basophils Relative: 0 %
Eosinophils Absolute: 0 10*3/uL (ref 0.0–0.7)
Eosinophils Relative: 0 %
HCT: 34.1 % — ABNORMAL LOW (ref 36.0–46.0)
Hemoglobin: 10.8 g/dL — ABNORMAL LOW (ref 12.0–15.0)
Lymphocytes Relative: 2 %
Lymphs Abs: 1.1 10*3/uL (ref 0.7–4.0)
MCH: 24.4 pg — ABNORMAL LOW (ref 26.0–34.0)
MCHC: 31.7 g/dL (ref 30.0–36.0)
MCV: 77.1 fL — ABNORMAL LOW (ref 78.0–100.0)
Monocytes Absolute: 1.1 10*3/uL — ABNORMAL HIGH (ref 0.1–1.0)
Monocytes Relative: 2 %
Neutro Abs: 51.8 10*3/uL — ABNORMAL HIGH (ref 1.7–7.7)
Neutrophils Relative %: 96 %
Platelets: 523 10*3/uL — ABNORMAL HIGH (ref 150–400)
RBC: 4.42 MIL/uL (ref 3.87–5.11)
RDW: 18.2 % — ABNORMAL HIGH (ref 11.5–15.5)
WBC Morphology: INCREASED
WBC: 54 10*3/uL (ref 4.0–10.5)

## 2017-12-08 LAB — URINALYSIS, ROUTINE W REFLEX MICROSCOPIC
Bacteria, UA: NONE SEEN
Bilirubin Urine: NEGATIVE
Glucose, UA: NEGATIVE mg/dL
Ketones, ur: NEGATIVE mg/dL
Leukocytes, UA: NEGATIVE
Nitrite: NEGATIVE
Protein, ur: 100 mg/dL — AB
Specific Gravity, Urine: 1.016 (ref 1.005–1.030)
pH: 6 (ref 5.0–8.0)

## 2017-12-08 LAB — RAPID URINE DRUG SCREEN, HOSP PERFORMED
Amphetamines: NOT DETECTED
Barbiturates: NOT DETECTED
Benzodiazepines: POSITIVE — AB
Cocaine: NOT DETECTED
Opiates: POSITIVE — AB
Tetrahydrocannabinol: NOT DETECTED

## 2017-12-08 LAB — COMPREHENSIVE METABOLIC PANEL
ALT: 12 U/L — ABNORMAL LOW (ref 14–54)
AST: 23 U/L (ref 15–41)
Albumin: 1.8 g/dL — ABNORMAL LOW (ref 3.5–5.0)
Alkaline Phosphatase: 218 U/L — ABNORMAL HIGH (ref 38–126)
Anion gap: 12 (ref 5–15)
BUN: 10 mg/dL (ref 6–20)
CO2: 11 mmol/L — ABNORMAL LOW (ref 22–32)
Calcium: 6.2 mg/dL — CL (ref 8.9–10.3)
Chloride: 123 mmol/L — ABNORMAL HIGH (ref 101–111)
Creatinine, Ser: 1.34 mg/dL — ABNORMAL HIGH (ref 0.44–1.00)
GFR calc Af Amer: 58 mL/min — ABNORMAL LOW (ref >60)
GFR calc non Af Amer: 50 mL/min — ABNORMAL LOW (ref >60)
Glucose, Bld: 151 mg/dL — ABNORMAL HIGH (ref 65–99)
Potassium: 2.4 mmol/L — CL (ref 3.5–5.1)
Sodium: 146 mmol/L — ABNORMAL HIGH (ref 135–145)
Total Bilirubin: 0.4 mg/dL (ref 0.3–1.2)
Total Protein: 5.1 g/dL — ABNORMAL LOW (ref 6.5–8.1)

## 2017-12-08 LAB — LEGIONELLA PNEUMOPHILA SEROGP 1 UR AG: L. pneumophila Serogp 1 Ur Ag: NEGATIVE

## 2017-12-08 LAB — PROCALCITONIN: Procalcitonin: 32.13 ng/mL

## 2017-12-08 LAB — PARATHYROID HORMONE, INTACT (NO CA): PTH: 42 pg/mL (ref 15–65)

## 2017-12-08 LAB — LACTIC ACID, PLASMA
Lactic Acid, Venous: 1.1 mmol/L (ref 0.5–1.9)
Lactic Acid, Venous: 0.6 mmol/L (ref 0.5–1.9)

## 2017-12-08 LAB — PROTIME-INR
INR: 1.35
Prothrombin Time: 16.5 seconds — ABNORMAL HIGH (ref 11.4–15.2)

## 2017-12-08 LAB — MRSA PCR SCREENING: MRSA by PCR: NEGATIVE

## 2017-12-08 IMAGING — CR DG CHEST 1V PORT
1 series · 1 of 1 positions shown · non-contrast
Comparison: Prior chest x-ray [DATE]

CLINICAL DATA: 36-year-old female status post intubation and
gastric tube placement

EXAM:
PORTABLE CHEST 1 VIEW

[AP]
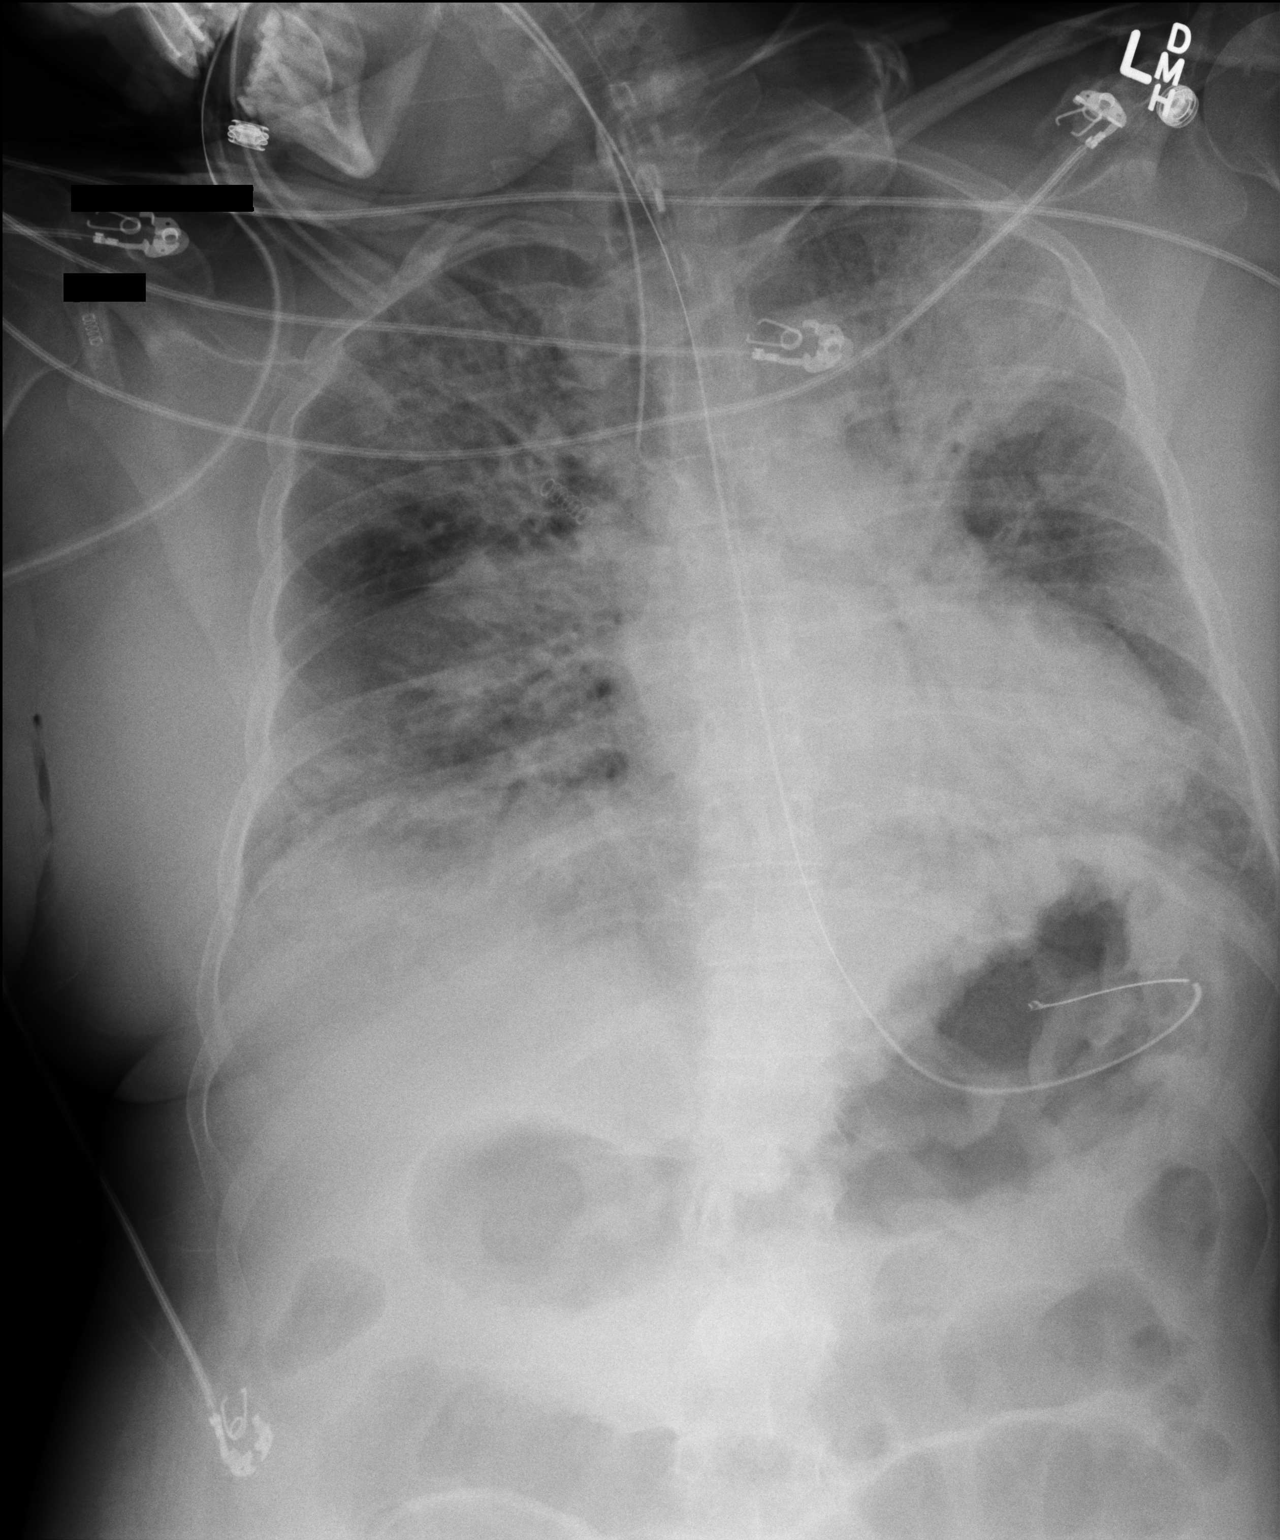

[1 of 1 positions shown; findings below may reference images not displayed]

FINDINGS: The patient is intubated. The tip of the endotracheal tube is at the
level of the carina and overrides the right mainstem bronchus.
Recommend withdrawing 3-4 cm for. The gastric tube is well
positioned and coiled in the gastric bubble. Extensive bilateral
interstitial and airspace opacities most confluent in the left upper
lobe. No pneumothorax. No definite pleural effusion. No acute
osseous abnormality. The visualized bowel gas pattern is
unremarkable.
IMPRESSION: 1. The tip of the endotracheal tube overrides the right mainstem
bronchus. Recommend withdrawing 3-4 cm for more optimal placement.
2. Relatively rapid interval progression of diffuse bilateral
interstitial and airspace opacities most confluent in the left upper
lobe. Differential considerations include large volume aspiration,
aggressive multifocal pneumonia, and less likely multifocal alveolar
hemorrhage or acute lung injury (ARDS, alveolitis).
3. Well-positioned gastric tube.

These results will be called to the ordering clinician or
representative by the Radiologist Assistant, and communication
documented in the PACS or zVision Dashboard.

## 2017-12-08 IMAGING — DX DG CHEST 1V PORT
1 series · 1 of 1 positions shown · non-contrast
Comparison: Portable chest of [DATE]

CLINICAL DATA: New central line placement

EXAM:
PORTABLE CHEST 1 VIEW

[chest]
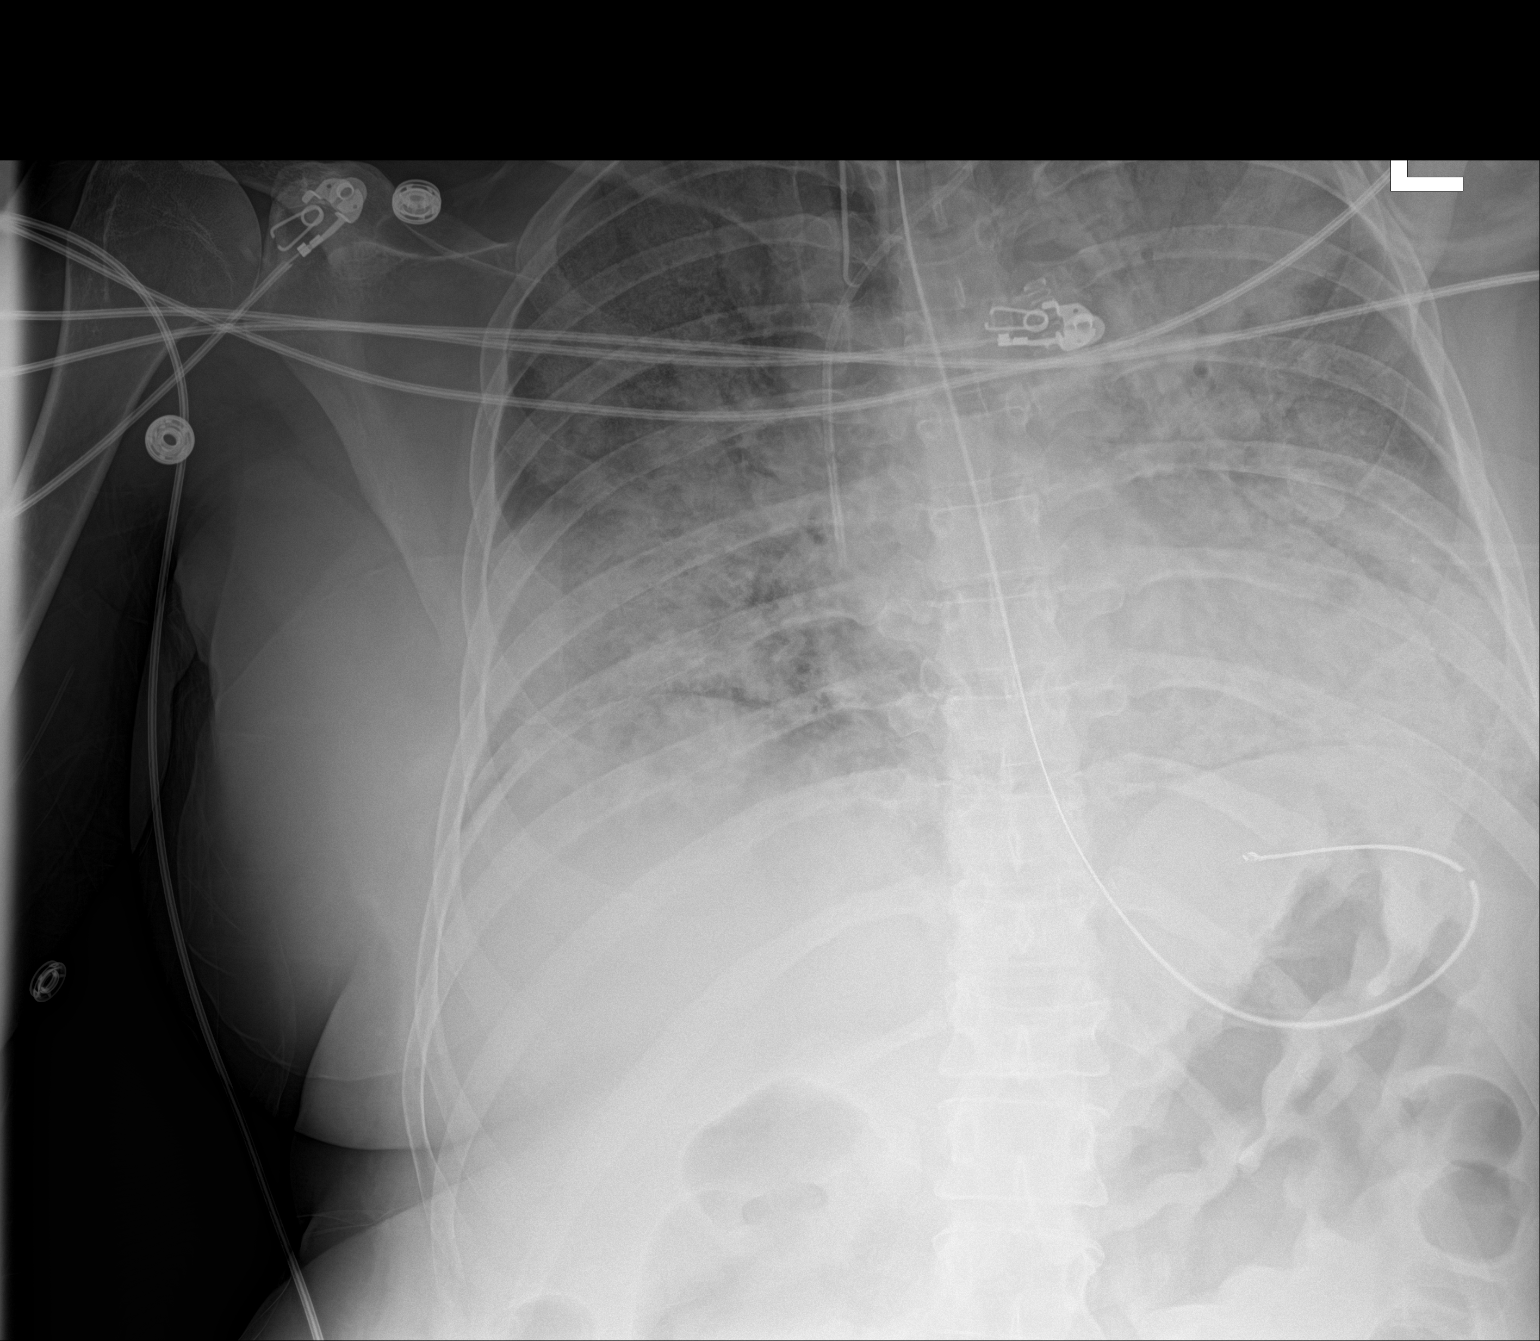

[1 of 1 positions shown; findings below may reference images not displayed]

FINDINGS: The tip of the endotracheal tube is approximately 2.1 cm above the
carina. A new left IJ central venous line has been inserted with the
tip overlying the expected SVC-RA junction. No pneumothorax is seen.
There has been interval worsening of diffuse airspace disease most
consistent with worsening of edema, although superimposed pneumonia
cannot be excluded. NG tube coils in the fundus of the stomach.
IMPRESSION: 1. New left central venous line tip overlies expected SVC-RA
junction.
2. Worsening of diffuse airspace disease. Probable worsening of
edema although superimposed pneumonia cannot be excluded.
3. Endotracheal tube tip 2.1 cm above the carina.

## 2017-12-08 MED ORDER — POTASSIUM CHLORIDE 20 MEQ/15ML (10%) PO SOLN
40.0000 meq | Freq: Once | ORAL | Status: AC
  Administered 2017-12-08: 40 meq via ORAL
  Filled 2017-12-08: qty 30

## 2017-12-08 MED ORDER — INSULIN ASPART 100 UNIT/ML ~~LOC~~ SOLN
1.0000 [IU] | SUBCUTANEOUS | Status: DC
  Administered 2017-12-08: 1 [IU] via SUBCUTANEOUS
  Administered 2017-12-08 (×2): 2 [IU] via SUBCUTANEOUS
  Administered 2017-12-09: 1 [IU] via SUBCUTANEOUS
  Administered 2017-12-09: 2 [IU] via SUBCUTANEOUS
  Administered 2017-12-09 – 2017-12-12 (×11): 1 [IU] via SUBCUTANEOUS
  Administered 2017-12-12: 2 [IU] via SUBCUTANEOUS
  Administered 2017-12-12 (×2): 1 [IU] via SUBCUTANEOUS
  Administered 2017-12-13: 2 [IU] via SUBCUTANEOUS
  Administered 2017-12-13: 1 [IU] via SUBCUTANEOUS
  Administered 2017-12-13 (×2): 2 [IU] via SUBCUTANEOUS
  Administered 2017-12-13 – 2017-12-14 (×4): 1 [IU] via SUBCUTANEOUS

## 2017-12-08 MED ORDER — VITAL HIGH PROTEIN PO LIQD
1000.0000 mL | ORAL | Status: DC
  Administered 2017-12-08 (×2): 1000 mL

## 2017-12-08 MED ORDER — CISATRACURIUM BOLUS VIA INFUSION
2.5000 mg | Freq: Once | INTRAVENOUS | Status: AC
  Administered 2017-12-08: 2.5 mg via INTRAVENOUS
  Filled 2017-12-08: qty 3

## 2017-12-08 MED ORDER — CHLORHEXIDINE GLUCONATE 0.12% ORAL RINSE (MEDLINE KIT)
15.0000 mL | Freq: Two times a day (BID) | OROMUCOSAL | Status: DC
  Administered 2017-12-08: 15 mL via OROMUCOSAL

## 2017-12-08 MED ORDER — LEVALBUTEROL HCL 1.25 MG/0.5ML IN NEBU
1.2500 mg | INHALATION_SOLUTION | Freq: Four times a day (QID) | RESPIRATORY_TRACT | Status: DC | PRN
  Filled 2017-12-08: qty 0.5

## 2017-12-08 MED ORDER — VANCOMYCIN HCL IN DEXTROSE 750-5 MG/150ML-% IV SOLN
750.0000 mg | Freq: Two times a day (BID) | INTRAVENOUS | Status: DC
  Administered 2017-12-08: 750 mg via INTRAVENOUS
  Filled 2017-12-08 (×2): qty 150

## 2017-12-08 MED ORDER — FENTANYL CITRATE (PF) 100 MCG/2ML IJ SOLN
50.0000 ug | Freq: Once | INTRAMUSCULAR | Status: AC
  Administered 2017-12-08: 50 ug via INTRAVENOUS
  Filled 2017-12-08: qty 2

## 2017-12-08 MED ORDER — FENTANYL CITRATE (PF) 100 MCG/2ML IJ SOLN: 100.0000 ug | Freq: Once | INTRAMUSCULAR | Status: DC | PRN

## 2017-12-08 MED ORDER — MIDAZOLAM HCL 2 MG/2ML IJ SOLN
2.0000 mg | INTRAMUSCULAR | Status: DC | PRN
  Administered 2017-12-08 (×2): 2 mg via INTRAVENOUS
  Filled 2017-12-08 (×3): qty 2

## 2017-12-08 MED ORDER — ONDANSETRON HCL 4 MG/2ML IJ SOLN
4.0000 mg | Freq: Four times a day (QID) | INTRAMUSCULAR | Status: DC | PRN
  Administered 2017-12-14 (×2): 4 mg via INTRAVENOUS
  Filled 2017-12-08 (×2): qty 2

## 2017-12-08 MED ORDER — PRO-STAT SUGAR FREE PO LIQD
30.0000 mL | Freq: Two times a day (BID) | ORAL | Status: DC
  Administered 2017-12-08: 30 mL
  Filled 2017-12-08 (×2): qty 30

## 2017-12-08 MED ORDER — FENTANYL CITRATE (PF) 100 MCG/2ML IJ SOLN
INTRAMUSCULAR | Status: AC
  Administered 2017-12-08: 07:00:00
  Filled 2017-12-08: qty 2

## 2017-12-08 MED ORDER — SODIUM CHLORIDE 0.9 % IV BOLUS
1000.0000 mL | Freq: Once | INTRAVENOUS | Status: AC
  Administered 2017-12-08: 1000 mL via INTRAVENOUS

## 2017-12-08 MED ORDER — FENTANYL BOLUS VIA INFUSION
50.0000 ug | INTRAVENOUS | Status: DC | PRN
  Administered 2017-12-08: 50 ug via INTRAVENOUS
  Filled 2017-12-08: qty 50

## 2017-12-08 MED ORDER — ARFORMOTEROL TARTRATE 15 MCG/2ML IN NEBU
15.0000 ug | INHALATION_SOLUTION | Freq: Two times a day (BID) | RESPIRATORY_TRACT | Status: DC
  Administered 2017-12-08 – 2017-12-14 (×14): 15 ug via RESPIRATORY_TRACT
  Filled 2017-12-08 (×15): qty 2

## 2017-12-08 MED ORDER — MAGNESIUM SULFATE 2 GM/50ML IV SOLN
2.0000 g | Freq: Once | INTRAVENOUS | Status: AC
  Administered 2017-12-08: 2 g via INTRAVENOUS
  Filled 2017-12-08: qty 50

## 2017-12-08 MED ORDER — SODIUM CHLORIDE 0.9% FLUSH: 10.0000 mL | INTRAVENOUS | Status: DC | PRN

## 2017-12-08 MED ORDER — BUDESONIDE 0.5 MG/2ML IN SUSP
0.5000 mg | Freq: Four times a day (QID) | RESPIRATORY_TRACT | Status: DC
  Administered 2017-12-08: 0.5 mg via RESPIRATORY_TRACT
  Filled 2017-12-08 (×2): qty 2

## 2017-12-08 MED ORDER — FENTANYL 2500MCG IN NS 250ML (10MCG/ML) PREMIX INFUSION
40.0000 ug/h | INTRAVENOUS | Status: DC
Start: 2017-12-08 — End: 2017-12-08

## 2017-12-08 MED ORDER — SODIUM CHLORIDE 0.9 % IV SOLN
1250.0000 mg | Freq: Once | INTRAVENOUS | Status: AC
  Administered 2017-12-08: 1250 mg via INTRAVENOUS
  Filled 2017-12-08: qty 1250

## 2017-12-08 MED ORDER — BUDESONIDE 0.5 MG/2ML IN SUSP
0.5000 mg | Freq: Two times a day (BID) | RESPIRATORY_TRACT | Status: DC
  Administered 2017-12-08 – 2017-12-19 (×21): 0.5 mg via RESPIRATORY_TRACT
  Filled 2017-12-08 (×22): qty 2

## 2017-12-08 MED ORDER — POTASSIUM CHLORIDE 10 MEQ/50ML IV SOLN
10.0000 meq | INTRAVENOUS | Status: AC
  Administered 2017-12-08 (×2): 10 meq via INTRAVENOUS
  Filled 2017-12-08 (×2): qty 50

## 2017-12-08 MED ORDER — MIDAZOLAM BOLUS VIA INFUSION
2.0000 mg | INTRAVENOUS | Status: DC | PRN
  Filled 2017-12-08: qty 2

## 2017-12-08 MED ORDER — SODIUM CHLORIDE 0.9 % IV SOLN
3.0000 ug/kg/min | INTRAVENOUS | Status: DC
  Administered 2017-12-08: 2 ug/kg/min via INTRAVENOUS
  Administered 2017-12-08: 3 ug/kg/min via INTRAVENOUS
  Filled 2017-12-08: qty 20

## 2017-12-08 MED ORDER — ORAL CARE MOUTH RINSE
15.0000 mL | OROMUCOSAL | Status: DC
  Administered 2017-12-08 – 2017-12-10 (×16): 15 mL via OROMUCOSAL

## 2017-12-08 MED ORDER — ORAL CARE MOUTH RINSE: 15.0000 mL | Freq: Four times a day (QID) | OROMUCOSAL | Status: DC

## 2017-12-08 MED ORDER — STERILE WATER FOR INJECTION IV SOLN
INTRAVENOUS | Status: DC
  Administered 2017-12-08 (×2): via INTRAVENOUS
  Filled 2017-12-08 (×3): qty 850

## 2017-12-08 MED ORDER — ROCURONIUM BROMIDE 50 MG/5ML IV SOLN
1.0000 mg/kg | Freq: Once | INTRAVENOUS | Status: AC
  Administered 2017-12-08: 57.6 mg via INTRAVENOUS
  Filled 2017-12-08 (×2): qty 5.76

## 2017-12-08 MED ORDER — FENTANYL BOLUS VIA INFUSION
50.0000 ug | INTRAVENOUS | Status: DC | PRN
  Filled 2017-12-08: qty 50

## 2017-12-08 MED ORDER — DOCUSATE SODIUM 50 MG/5ML PO LIQD: 100.0000 mg | Freq: Two times a day (BID) | ORAL | Status: DC | PRN

## 2017-12-08 MED ORDER — MIDAZOLAM HCL 2 MG/2ML IJ SOLN
2.0000 mg | Freq: Once | INTRAMUSCULAR | Status: AC
  Administered 2017-12-08: 2 mg via INTRAVENOUS

## 2017-12-08 MED ORDER — CHLORHEXIDINE GLUCONATE 0.12% ORAL RINSE (MEDLINE KIT): 15.0000 mL | Freq: Two times a day (BID) | OROMUCOSAL | Status: DC

## 2017-12-08 MED ORDER — PROPOFOL 1000 MG/100ML IV EMUL
0.0000 ug/kg/min | INTRAVENOUS | Status: DC
  Administered 2017-12-08: 30 ug/kg/min via INTRAVENOUS
  Administered 2017-12-08: 20 ug/kg/min via INTRAVENOUS
  Filled 2017-12-08 (×2): qty 100

## 2017-12-08 MED ORDER — CHLORHEXIDINE GLUCONATE 0.12% ORAL RINSE (MEDLINE KIT)
15.0000 mL | Freq: Two times a day (BID) | OROMUCOSAL | Status: DC
  Administered 2017-12-09 – 2017-12-10 (×2): 15 mL via OROMUCOSAL

## 2017-12-08 MED ORDER — FAMOTIDINE IN NACL 20-0.9 MG/50ML-% IV SOLN
20.0000 mg | INTRAVENOUS | Status: DC
  Administered 2017-12-08 – 2017-12-11 (×4): 20 mg via INTRAVENOUS
  Filled 2017-12-08 (×5): qty 50

## 2017-12-08 MED ORDER — MIDAZOLAM HCL 2 MG/2ML IJ SOLN
2.0000 mg | INTRAMUSCULAR | Status: DC | PRN
  Administered 2017-12-08 (×2): 2 mg via INTRAVENOUS

## 2017-12-08 MED ORDER — FENTANYL 2500MCG IN NS 250ML (10MCG/ML) PREMIX INFUSION
0.0000 ug/h | INTRAVENOUS | Status: DC
  Administered 2017-12-08: 400 ug/h via INTRAVENOUS
  Administered 2017-12-08: 100 ug/h via INTRAVENOUS
  Filled 2017-12-08: qty 250

## 2017-12-08 MED ORDER — SODIUM CHLORIDE 0.9 % IV SOLN
1.0000 g | Freq: Once | INTRAVENOUS | Status: AC
  Administered 2017-12-08: 1 g via INTRAVENOUS
  Filled 2017-12-08: qty 10

## 2017-12-08 MED ORDER — MIDAZOLAM HCL 2 MG/2ML IJ SOLN
2.0000 mg | Freq: Once | INTRAMUSCULAR | Status: DC | PRN
  Filled 2017-12-08: qty 2

## 2017-12-08 MED ORDER — CHLORHEXIDINE GLUCONATE CLOTH 2 % EX PADS
6.0000 | MEDICATED_PAD | Freq: Every day | CUTANEOUS | Status: DC
  Administered 2017-12-08 – 2017-12-18 (×11): 6 via TOPICAL

## 2017-12-08 MED ORDER — ORAL CARE MOUTH RINSE
15.0000 mL | OROMUCOSAL | Status: DC
  Administered 2017-12-08: 15 mL via OROMUCOSAL

## 2017-12-08 MED ORDER — VITAL HIGH PROTEIN PO LIQD: 1000.0000 mL | ORAL | Status: DC

## 2017-12-08 MED ORDER — SODIUM CHLORIDE 0.9 % IV SOLN
1.0000 g | Freq: Three times a day (TID) | INTRAVENOUS | Status: DC
  Administered 2017-12-08 – 2017-12-09 (×3): 1 g via INTRAVENOUS
  Filled 2017-12-08 (×5): qty 1

## 2017-12-08 MED ORDER — HEPARIN SODIUM (PORCINE) 5000 UNIT/ML IJ SOLN
5000.0000 [IU] | Freq: Three times a day (TID) | INTRAMUSCULAR | Status: DC
  Administered 2017-12-08 – 2017-12-20 (×35): 5000 [IU] via SUBCUTANEOUS
  Filled 2017-12-08 (×39): qty 1

## 2017-12-08 MED ORDER — SODIUM BICARBONATE 8.4 % IV SOLN
100.0000 meq | Freq: Once | INTRAVENOUS | Status: AC
  Administered 2017-12-08: 100 meq via INTRAVENOUS
  Filled 2017-12-08: qty 100

## 2017-12-08 MED ORDER — SODIUM CHLORIDE 0.9 % IV SOLN
2.0000 mg/h | INTRAVENOUS | Status: DC
  Administered 2017-12-08: 2 mg/h via INTRAVENOUS
  Administered 2017-12-08: 5 mg/h via INTRAVENOUS
  Filled 2017-12-08 (×3): qty 10

## 2017-12-08 MED ORDER — CHLORHEXIDINE GLUCONATE 0.12% ORAL RINSE (MEDLINE KIT)
15.0000 mL | Freq: Two times a day (BID) | OROMUCOSAL | Status: DC
  Administered 2017-12-08 – 2017-12-09 (×2): 15 mL via OROMUCOSAL

## 2017-12-08 MED ORDER — ARTIFICIAL TEARS OPHTHALMIC OINT
1.0000 "application " | TOPICAL_OINTMENT | Freq: Three times a day (TID) | OPHTHALMIC | Status: DC
  Administered 2017-12-08 – 2017-12-09 (×3): 1 via OPHTHALMIC
  Filled 2017-12-08 (×2): qty 3.5

## 2017-12-08 MED ORDER — FENTANYL 2500MCG IN NS 250ML (10MCG/ML) PREMIX INFUSION
100.0000 ug/h | INTRAVENOUS | Status: DC
  Administered 2017-12-08 – 2017-12-09 (×2): 300 ug/h via INTRAVENOUS
  Filled 2017-12-08 (×2): qty 250

## 2017-12-08 MED ORDER — SODIUM CHLORIDE 0.9% FLUSH
10.0000 mL | Freq: Two times a day (BID) | INTRAVENOUS | Status: DC
  Administered 2017-12-08: 40 mL
  Administered 2017-12-08 – 2017-12-13 (×11): 10 mL
  Administered 2017-12-14: 20 mL
  Administered 2017-12-14 – 2017-12-15 (×3): 10 mL
  Administered 2017-12-15: 20 mL
  Administered 2017-12-16: 10 mL
  Administered 2017-12-16: 20 mL
  Administered 2017-12-17 – 2017-12-19 (×4): 10 mL

## 2017-12-08 MED ORDER — SODIUM BICARBONATE 8.4 % IV SOLN
INTRAVENOUS | Status: AC
  Filled 2017-12-08: qty 100

## 2017-12-08 MED ORDER — MIDAZOLAM HCL 2 MG/2ML IJ SOLN
INTRAMUSCULAR | Status: AC
  Filled 2017-12-08: qty 2

## 2017-12-08 MED ORDER — POTASSIUM CHLORIDE 10 MEQ/50ML IV SOLN
10.0000 meq | INTRAVENOUS | Status: AC
  Administered 2017-12-08 (×6): 10 meq via INTRAVENOUS
  Filled 2017-12-08 (×6): qty 50

## 2017-12-08 MED ORDER — SODIUM CHLORIDE 0.9 % IV SOLN
250.0000 mL | INTRAVENOUS | Status: DC | PRN
  Administered 2017-12-08 – 2017-12-09 (×2): 250 mL via INTRAVENOUS

## 2017-12-08 MED ORDER — IPRATROPIUM BROMIDE 0.02 % IN SOLN
0.5000 mg | Freq: Four times a day (QID) | RESPIRATORY_TRACT | Status: DC | PRN
  Administered 2017-12-08: 0.5 mg via RESPIRATORY_TRACT
  Filled 2017-12-08: qty 2.5

## 2017-12-08 MED ORDER — FUROSEMIDE 10 MG/ML IJ SOLN
INTRAMUSCULAR | Status: AC
  Filled 2017-12-08: qty 2

## 2017-12-08 MED ORDER — ALBUTEROL SULFATE (2.5 MG/3ML) 0.083% IN NEBU
2.5000 mg | INHALATION_SOLUTION | RESPIRATORY_TRACT | Status: DC | PRN
  Administered 2017-12-09 – 2017-12-14 (×2): 2.5 mg via RESPIRATORY_TRACT
  Filled 2017-12-08 (×2): qty 3

## 2017-12-08 MED ORDER — FENTANYL CITRATE (PF) 100 MCG/2ML IJ SOLN
100.0000 ug | Freq: Once | INTRAMUSCULAR | Status: AC
  Administered 2017-12-08: 100 ug via INTRAVENOUS

## 2017-12-08 MED ORDER — FUROSEMIDE 10 MG/ML IJ SOLN
20.0000 mg | Freq: Once | INTRAMUSCULAR | Status: AC
  Administered 2017-12-08: 20 mg via INTRAVENOUS

## 2017-12-08 MED ORDER — FENTANYL 2500MCG IN NS 250ML (10MCG/ML) PREMIX INFUSION
25.0000 ug/h | INTRAVENOUS | Status: DC
  Administered 2017-12-08: 100 ug/h via INTRAVENOUS
  Filled 2017-12-08: qty 250

## 2017-12-08 MED FILL — Medication: Qty: 1 | Status: AC

## 2017-12-08 NOTE — Progress Notes (Signed)
Rocuronium won't scan pharmacy called. Will change for the future. Weyman Rodney, RN 12/08/2017 12:32 PM

## 2017-12-08 NOTE — Progress Notes (Addendum)
TOF twitches 0 since nimbex bolus. Multiple sites and TOF machines used.  However, pt began moving head ~1830. Nimbex restarted at 31mcg  1845 3rd machine obtained TOF @30 , 2 twitches

## 2017-12-08 NOTE — Progress Notes (Addendum)
Code Blue initiated for patient. Rapid Response asked for 146mcg Fentanyl IV and 2mg  Versed IV. Medications overrided from [employer] under [employer]. Medication override witnessed by Hosie Spangle, RN. Medications given to Puja, Rapid Response nurse, for administration. Jonni Sanger in pharmacy notified of override under [employer].  12/08/17 @ 0657 - Medications corrected in [place] to reflect that medications were pulled for the patient, Menge,[place], and not under [employer]. Medication correction witnessed by Hosie Spangle, RN.

## 2017-12-08 NOTE — Progress Notes (Signed)
Pharmacy Antibiotic Note  [name] is a 37 y.o. female admitted on 12/06/2017 with pneumonia.  Pharmacy has been consulted for vancomycin and meropenem dosing. Per MD, pulmonary infiltrates consistent with ARDS. WBC 54k. SCr 1.34 (BL ~ 0.9). CrCL ~ 60 mL/min.   Plan: -Vancomycin 1250 mg IV once, then vancomycin 750 mg IV Q 12 hours. Trough goal 15-20 mcg/mL -Meropenem 1 gm IV Q 8 hours -Monitor CBC, renal fx, cultures and clinical progress -VT at SS  Height: 5\' 2"  (157.5 cm) Weight: 126 lb 15.8 oz (57.6 kg) IBW/kg (Calculated) : 50.1  Temp (24hrs), Avg:97.8 F (36.6 C), Min:97.4 F (36.3 C), Max:98.4 F (36.9 C)  Recent Labs  Lab 12/06/17 1957 12/06/17 2055 12/07/17 0445 12/07/17 0449 12/07/17 1047 12/07/17 1218 12/07/17 1611  WBC 36.2*  --   --   --   --   --   --   CREATININE 1.94*  --  1.89*  --  1.82*  --  1.63*  LATICACIDVEN  --  1.56  --  2.7*  --  1.6  --     Estimated Creatinine Clearance: 37.7 mL/min (A) (by C-G formula based on SCr of 1.63 mg/dL (H)).    Allergies  Allergen Reactions  . Penicillins Hives    Has patient had a PCN reaction causing immediate rash, facial/tongue/throat swelling, SOB or lightheadedness with hypotension: Yes Has patient had a PCN reaction causing severe rash involving mucus membranes or skin necrosis: Unk Has patient had a PCN reaction that required hospitalization: Yes Has patient had a PCN reaction occurring within the last 10 years: No If all of the above answers are "NO", then may proceed with Cephalosporin use.   Marland Kitchen Doxycycline Other (See Comments)    Patient cannot recall the reaction    Antimicrobials this admission: Vanc 5/3 >>  Merrem 5/3 >>   Dose adjustments this admission: None   Microbiology results: 5/2 Resp panel > rhinovirus  5/2 GAS PCR neg  5/2 BCx2>>   Thank you for allowing pharmacy to be a part of this patient's care.  Albertina Parr, PharmD., BCPS Clinical Pharmacist Clinical phone for  12/08/17 until 3:30pm: [phone] If after 3:30pm, please call main pharmacy at: [phone]

## 2017-12-08 NOTE — Progress Notes (Signed)
ABG critical values given to Dr. Lake Bells.

## 2017-12-08 NOTE — Consult Note (Signed)
PULMONARY / CRITICAL CARE MEDICINE   Name: [name] MRN: [id number] DOB: 11-22-1980    ADMISSION DATE:  12/06/2017 CONSULTATION DATE:  5/3  REFERRING MD:  Dr. Lonny Prude  CHIEF COMPLAINT:  Dyspnea and delirium  HISTORY OF PRESENT ILLNESS:   37 year old female with PMH as below, which is significant for Crohn's, HTN, IV drug use, and hypertension. She presented to [facility] ED 5/1 with cough and chest pain. On arrival she was complaining of URI symptoms including sore throat x 4 days. She also developed ear draining and hearing loss. Chest pain was described as sharp constant 8/10. She also developed nausea/vomiting/excessive diarrhea, and admitted that she hasn't been taking her Crohn's medications. Laboratory evaluation was significant for WBC 36.2, K 2, Mag 0.4. She was admitted to the hospitalists team and treated for sepsis of unknown origin, thought to be related to bronchitis vs sinusitis with azithromycin and ceftriaxone. Also had significant electrolyte replacement. I get the sense that she was generally improving until the early morning hours of 5/3 when she developed hypoxia and delirium. There was a brief episode where she became unresponsive and a code was called, but as RN was initiating CPR, patient woke up but was very agitated and uncontrollable along with hypoxic eventually requiring intubation.   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), ADHD (12/02/2009), Allergic rhinitis, cause unspecified (01/21/2011), ANXIETY (03/24/2009), B12 DEFICIENCY (04/28/2009), Bronchitis (12/2017), BURSITIS, RIGHT KNEE (02/05/2010), Cellulitis and abscess of leg, except foot (02/05/2010), Cervicalgia (12/02/2009), COMMON MIGRAINE (02/05/2010), CROHN'S DISEASE-SMALL INTESTINE (05/19/2009), ECZEMA (05/20/2010), Endometriosis (08/04/2011), Fibromyalgia, GERD (03/24/2009), HEADACHE, CHRONIC (03/24/2009), HYPERTENSION  (03/24/2009), Osteoarthritis, OTITIS MEDIA, LEFT (08/12/2010), SMOKER (12/02/2009), Spine pain (01/21/2011), VITAMIN B1 DEFICIENCY (09/21/2009), and Wheezing (08/12/2010).  PAST SURGICAL HISTORY: She  has a past surgical history that includes Endometrial ablation (01/2009); Breast enhancement surgery (2004); and Rectal prolapse repair.  Allergies  Allergen Reactions  . Penicillins Hives    Has patient had a PCN reaction causing immediate rash, facial/tongue/throat swelling, SOB or lightheadedness with hypotension: Yes Has patient had a PCN reaction causing severe rash involving mucus membranes or skin necrosis: Unk Has patient had a PCN reaction that required hospitalization: Yes Has patient had a PCN reaction occurring within the last 10 years: No If all of the above answers are "NO", then may proceed with Cephalosporin use.   Marland Kitchen Doxycycline Other (See Comments)    Patient cannot recall the reaction    No current facility-administered medications on file prior to encounter.    Current Outpatient Medications on File Prior to Encounter  Medication Sig  . esomeprazole (NEXIUM) 40 MG capsule Take 40 mg by mouth 2 (two) times daily before a meal.  . naproxen sodium (ALEVE) 220 MG tablet Take 220-440 mg by mouth 2 (two) times daily as needed (for pain or discomfort).  . ALPRAZolam (XANAX) 1 MG tablet Take 1 tablet (1 mg total) by mouth 3 (three) times daily as needed for anxiety. (Patient not taking: Reported on 12/06/2017)  . budesonide (ENTOCORT EC) 3 MG 24 hr capsule Take 3 capsules (9 mg total) by mouth daily. (Patient not taking: Reported on 12/06/2017)  . ciprofloxacin (CIPRO) 500 MG tablet Take 1 tablet (500 mg total) by mouth every 12 (twelve) hours. (Patient not taking: Reported on 12/06/2017)  . clindamycin (CLEOCIN) 300 MG capsule Take 1 capsule (300 mg total) by mouth every 6 (six) hours. (Patient not taking: Reported  on 12/06/2017)  . fexofenadine (ALLEGRA) 180 MG tablet Take 1 tablet (180 mg  total) by mouth daily. (Patient not taking: Reported on 12/06/2017)  . magnesium oxide (MAG-OX) 400 MG tablet Take 1 tablet (400 mg total) by mouth 2 (two) times daily. (Patient not taking: Reported on 12/06/2017)  . metoprolol succinate (TOPROL-XL) 100 MG 24 hr tablet Take 1 tablet (100 mg total) by mouth daily. Take with or immediately following a meal. (Patient not taking: Reported on 12/06/2017)  . ondansetron (ZOFRAN ODT) 4 MG disintegrating tablet Take 1 tablet (4 mg total) by mouth every 8 (eight) hours as needed for nausea or vomiting. (Patient not taking: Reported on 12/06/2017)  . oxyCODONE (ROXICODONE) 5 MG immediate release tablet Take 1 tablet (5 mg total) by mouth every 4 (four) hours as needed for severe pain. (Patient not taking: Reported on 12/06/2017)  . oxyCODONE-acetaminophen (PERCOCET/ROXICET) 5-325 MG tablet Take 1 tablet by mouth every 6 (six) hours as needed for severe pain. (Patient not taking: Reported on 12/06/2017)  . pantoprazole (PROTONIX) 40 MG tablet Take 1 tablet (40 mg total) by mouth 2 (two) times daily. (Patient not taking: Reported on 12/06/2017)  . polyethylene glycol (MIRALAX / GLYCOLAX) packet Take 34 g by mouth daily. (Patient not taking: Reported on 12/06/2017)  . [DISCONTINUED] amLODipine (NORVASC) 10 MG tablet Take 1 tablet (10 mg total) by mouth daily.  . [DISCONTINUED] pregabalin (LYRICA) 50 MG capsule Take 1 capsule (50 mg total) by mouth 3 (three) times daily.    FAMILY HISTORY:  Her indicated that her mother is alive. She indicated that the status of her maternal grandmother is unknown. She indicated that the status of her maternal grandfather is unknown.   SOCIAL HISTORY: She  reports that she has been smoking cigarettes.  She has a 7.00 pack-year smoking history. She has never used smokeless tobacco. She reports that she drinks alcohol. She reports that she has current or past drug history. Drug: IV.  REVIEW OF SYSTEMS:   unable  SUBJECTIVE:  unable  VITAL  SIGNS: BP 111/78 (BP Location: Right Arm)   Pulse 96   Temp (!) 97.5 F (36.4 C)   Resp (!) 36   Ht 5' 2.5" (1.588 m)   Wt 59 kg (130 lb)   LMP 12/03/2017 (Exact Date)   SpO2 (!) 72%   BMI 23.40 kg/m   HEMODYNAMICS:    VENTILATOR SETTINGS:    INTAKE / OUTPUT: I/O last 3 completed shifts: In: 5300 [I.V.:1250; IV Piggyback:4050] Out: 0   PHYSICAL EXAMINATION: General:  Young adult female with significant agitation.  Neuro:  Very agitated and combative HEENT:  [place]/AT, PERRL, no JVD Cardiovascular:  RRR, no MRG Lungs:  Coarse bilateral Abdomen:  Soft, non-tender, non-distended. Musculoskeletal:  No acute deformity or ROM limitation Skin:  Grossly intact.   LABS:  BMET Recent Labs  Lab 12/07/17 0445 12/07/17 1047 12/07/17 1611  NA 138 143 142  K 2.3* 2.4* 2.8*  CL 114* 119* 120*  CO2 10* 10* 12*  BUN 12 13 12   CREATININE 1.89* 1.82* 1.63*  GLUCOSE 199* 102* 174*    Electrolytes Recent Labs  Lab 12/06/17 1957 12/07/17 0445 12/07/17 1047 12/07/17 1611  CALCIUM 5.0* 5.8* 8.8* 8.1*  MG 0.4* 2.2  --  2.0    CBC Recent Labs  Lab 12/06/17 1957  WBC 36.2*  HGB 10.9*  HCT 33.4*  PLT 491*    Coag's No results for input(s): APTT, INR in the last 168 hours.  Sepsis Markers Recent Labs  Lab 12/06/17 2055 12/07/17 0445 12/07/17 0449 12/07/17 1218  LATICACIDVEN 1.56  --  2.7* 1.6  PROCALCITON  --  88.50  --   --     ABG No results for input(s): PHART, PCO2ART, PO2ART in the last 168 hours.  Liver Enzymes Recent Labs  Lab 12/06/17 1957 12/07/17 1047  AST 14* 20  ALT 8* QUANTITY NOT SUFFICIENT, UNABLE TO PERFORM TEST  ALKPHOS 155* 168*  BILITOT 1.0 QUANTITY NOT SUFFICIENT, UNABLE TO PERFORM TEST  ALBUMIN 2.2* 2.4*    Cardiac Enzymes No results for input(s): TROPONINI, PROBNP in the last 168 hours.  Glucose No results for input(s): GLUCAP in the last 168 hours.  Imaging Ct Maxillofacial Wo Contrast  Result Date: 12/07/2017 CLINICAL  DATA:  Bilateral nasal congestion with productive cough and shortness of breath EXAM: CT MAXILLOFACIAL WITHOUT CONTRAST TECHNIQUE: Multidetector CT imaging of the maxillofacial structures was performed. Multiplanar CT image reconstructions were also generated. COMPARISON:  12/17/2009 head CT FINDINGS: Osseous: Negative for fracture or destructive change. Orbits: Negative. No traumatic or inflammatory finding. Sinuses: Diffuse mucosal thickening with maxillary and sphenoid sinus fluid levels. Both ostiomeatal units are opacified by mucosal thickening. Bilateral mastoid subtotal opacification with negative nasopharynx. There is left middle ear opacification. No erosive changes. Soft tissues: Negative Limited intracranial: Negative IMPRESSION: 1. Generalized acute sinusitis with fluid levels. 2. Bilateral mastoid and left middle ear opacification, possible otomastoiditis. Electronically Signed   By: Monte Fantasia M.D.   On: 12/07/2017 11:39     STUDIES:  CT facial 5/2 > Generalized acute sinusitis with fluid levels. Bilateral mastoid and left middle ear opacification, possible otomastoiditis. CT abdomen 5/2 > No acute process demonstrated in the abdomen or pelvis. No evidence of bowel obstruction or inflammation. No bowel wall thickening is identified but under distention limits evaluation. Mild gallbladder distention without inflammatory changes or stones, likely physiologic.  CULTURES: Blood 5/2 > RVP 5/1 > rhinovirus/enterovirus Tracheal aspirate 5/3 >   ANTIBIOTICS: Ceftriaxone 5/1 >5/3 Azithromycin 5/1> 5/3 Zosyn 5/3 >>> Vancomycin 5/3 >>>  SIGNIFICANT EVENTS: 5/1 admit 5/3 agitation and hypoxia. Intubated to ICU.   LINES/TUBES: ETT 5/3 >  DISCUSSION: 37 year old female presented with URI symptoms, chest pain, vomiting. Admitted and treated for sinusitis vs bronchitis. Condition worsened 5/3 early am with worsening delirium and hypoxia requiring intubation. CXR with diffuse  infiltrates.   ASSESSMENT / PLAN:  PULMONARY A: Acute hypoxemic respiratory failure unclear etiology. CXR clear on admission. Does not appear volume overloaded. Minimal ETT secretions.  Tobacco abuse disorder - no COPD diagnosis  P:   Full vent support Vent bundle Nebs CXR ABG  CARDIOVASCULAR A:  Hypotension post intubation. At risk septic shock.   P:  Telemetry monitoring IVF resuscitation Echocardiogram Trend troponin  RENAL A:   Acute renal failure Hypokalemia Hypomagnesemia Hypocalcemia NAG acidosis  P:   Repeat BMP, mag this AM   GASTROINTESTINAL A:   Crohn's disease: Flare vs viral illness  P:   Volume resuscitation NPO Pepcid  HEMATOLOGIC A:   Anemia  P:  Repeat CBC this AM SQ heparin   INFECTIOUS A:   Sepsis:  Bilateral pneumonia. CT facial demonstrates sinusitis +/- possible otomastoiditis.  Rhinovirus/Enterovirus  P:   Broaden ABX for HCAP coverage. Follow cultures Trend PCT, WBC, Fever curve Would consider LP, but now CXR has grown out pretty significant infiltrates.   ENDOCRINE A:   No acute issues  P:   Follow glucose on chemistry  NEUROLOGIC  A:   Acute metabolic encephalopathy Possible toxic encephalopathy due to sedating meds.  Hx IVDA P:   RASS goal: -1 to -2 Propofol infusion PRN versed   FAMILY  - Updates: Family coming in to hospital  - Inter-disciplinary family meet or Palliative Care meeting due by:  [place], AGACNP-BC [facility] Pulmonology/Critical Care Pager [phone] or [phone]  12/08/2017 7:42 AM

## 2017-12-08 NOTE — Progress Notes (Signed)
RT assisted with prone positioning pt.  Verified ETT remained secured at 21, pt head turned to the left.  RT will continue to monitor.

## 2017-12-08 NOTE — Procedures (Signed)
Central Venous Catheter Insertion Procedure Note [name] [id number] 1980/08/09  Procedure: Insertion of Central Venous Catheter Indications: Assessment of intravascular volume, Drug and/or fluid administration and Frequent blood sampling  Procedure Details Consent: Risks of procedure as well as the alternatives and risks of each were explained to the (patient/caregiver).  Consent for procedure obtained. Time Out: Verified patient identification, verified procedure, site/side was marked, verified correct patient position, special equipment/implants available, medications/allergies/relevent history reviewed, required imaging and test results available.  Performed  Maximum sterile technique was used including antiseptics, cap, gloves, gown, hand hygiene, mask and sheet. Skin prep: Chlorhexidine; local anesthetic administered A antimicrobial bonded/coated triple lumen catheter was placed in the left internal jugular vein using the Seldinger technique. Ultrasound guidance used.Yes.   Catheter placed to 20 cm. Blood aspirated via all 3 ports and then flushed x 3. Line sutured x 2 and dressing applied.  Evaluation Blood flow good Complications: No apparent complications Patient did tolerate procedure well. Chest X-ray ordered to verify placement.  CXR: pending.  [name] ACNP Maryanna Shape PCCM Pager [phone] till 1 pm If no answer page [phone] 12/08/2017, 8:31 AM

## 2017-12-08 NOTE — Procedures (Signed)
Endotracheal Intubation Procedure Note Indication for endotracheal intubation: impending respiratory failure Sedation: etomidate, fentanyl and midazolam Paralytic: succinylcholine Equipment: Macintosh 3 laryngoscope blade and 7.29m cuffed endotracheal tube; secured 23cm at the lip Cricoid Pressure: no Number of attempts: 1 ETT location confirmed by by auscultation, by CXR and ETCO2 monitor.

## 2017-12-08 NOTE — Progress Notes (Addendum)
Initial Nutrition Assessment  DOCUMENTATION CODES:   Not applicable  INTERVENTION:    Vital High Protein at 50 ml/h (1200 ml per day)   Provides 1200 kcal (1475 kcal total with Propofol), 105 gm protein, 1003 ml free water daily  NUTRITION DIAGNOSIS:   Inadequate oral intake related to inability to eat as evidenced by NPO status.  GOAL:   Patient will meet greater than or equal to 90% of their needs  MONITOR:   Vent status, TF tolerance, Labs, I & O's  REASON FOR ASSESSMENT:   Ventilator, Consult Enteral/tube feeding initiation and management  ASSESSMENT:   37 yo female with PMH of vitamin B1 deficiency, B12 deficiency, anxiety, asthma, IVDU, smoker, ADHD, HTN, GERD, Crohn's disease, fibromyalgia, and osteoarthritis who was admitted on 5/1 with PNA and sepsis. Required intubation on 5/3 due to agitation and hypoxia.   Spoke with MD; received MD Consult for TF initiation and management.  Spoke with Mom who reports there are many foods that patient does not tolerate (causes diarrhea) such as eggs, salads, corn, and red sauces. She eats these foods sometimes even though she knows she will have diarrhea. She tolerates milk, cheese, and white bread and pasta well. Some weight gain recently due to "swelling" with Crohn's flare.    Patient is currently intubated on ventilator support MV: 12.6 L/min Temp (24hrs), Avg:97.8 F (36.6 C), Min:97.4 F (36.3 C), Max:98.4 F (36.9 C)  Propofol: 10.4 ml/hr providing 275 kcal from lipids. RN suspects rate will need to be increased some today.  MAP 59 Labs reviewed. Sodium 146 (H), potassium 2.4 (L), magnesium 1.5 (L) Medications reviewed and include Mag-Ox and KCl.   NUTRITION - FOCUSED PHYSICAL EXAM:    Most Recent Value  Orbital Region  Unable to assess  Upper Arm Region  No depletion  Thoracic and Lumbar Region  No depletion  Buccal Region  Unable to assess  Temple Region  Mild depletion  Clavicle Bone Region  No  depletion  Clavicle and Acromion Bone Region  No depletion  Scapular Bone Region  Unable to assess  Dorsal Hand  No depletion  Patellar Region  Unable to assess  Anterior Thigh Region  Unable to assess  Posterior Calf Region  Unable to assess  Edema (RD Assessment)  Unable to assess  Hair  Unable to assess  Eyes  Unable to assess  Mouth  Unable to assess  Skin  Reviewed  Nails  Reviewed       Diet Order:   Diet Order           Diet NPO time specified  Diet effective now          EDUCATION NEEDS:   No education needs have been identified at this time  Skin:  Skin Assessment: Reviewed RN Assessment  Last BM:  5/2  Height:   Ht Readings from Last 1 Encounters:  12/08/17 5\' 2"  (1.575 m)    Weight:   Wt Readings from Last 1 Encounters:  12/08/17 126 lb 15.8 oz (57.6 kg)    Ideal Body Weight:  50 kg  BMI:  Body mass index is 23.23 kg/m.  Estimated Nutritional Needs:   Kcal:  1530  Protein:  85-100 gm  Fluid:  >/= 1.5 L    Molli Barrows, RD, LDN, [place] Pager [phone] After Hours Pager [phone]

## 2017-12-08 NOTE — Progress Notes (Signed)
[facility] PCCM  Oxygenation worse  Plan Prone positioning Cisatracurium  Roselie Awkward, MD [place] PCCM Pager: [phone] Cell: [phone] After 3pm or if no response, call [phone]

## 2017-12-08 NOTE — Progress Notes (Signed)
Pts belongings from 6N transferred down to 2M02 by NT Harinder Romas from 6N. Nurse made aware.

## 2017-12-08 NOTE — Progress Notes (Signed)
Pt put in prone position. Vitals stable. Will obtain ABG in one hour

## 2017-12-08 NOTE — Progress Notes (Addendum)
Stat consult to PCCM. 3yoF with Crohn's dz, Asthma, HTN, GERD, Fibromyalgia, and IVDU, admitted with sepsis of unclear etiology. This AM patient became more hypoxic, agitated, and altered, fighting and pulling off O2 mask, not oriented or cooperative with care. Never lost a pulse.  Required intubation then emergent transfer to ICU. Labs, ABG, and CXR are pending. Patient hemodynamically stable. Lungs coarse on auscultation. Abdomen soft. Patient was moving all extremities prior to intubation.

## 2017-12-08 NOTE — Procedures (Signed)
Arterial Catheter Insertion Procedure Note [name] [id number] 1981/06/19  Procedure: Insertion of Arterial Catheter  Indications: Blood pressure monitoring and Frequent blood sampling  Procedure Details Consent: Risks of procedure as well as the alternatives and risks of each were explained to the (patient/caregiver).  Consent for procedure obtained. Time Out: Verified patient identification, verified procedure, site/side was marked, verified correct patient position, special equipment/implants available, medications/allergies/relevent history reviewed, required imaging and test results available.  Performed  Maximum sterile technique was used including antiseptics, cap, gloves, gown, hand hygiene, mask and sheet. Skin prep: Chlorhexidine; local anesthetic administered 20 gauge catheter was inserted into right radial artery using the Seldinger technique. ULTRASOUND GUIDANCE USED: NO Evaluation Blood flow good; BP tracing good. Complications: No apparent complications.   [name] 12/08/2017

## 2017-12-08 NOTE — Progress Notes (Signed)
[facility] PCCM  Oxygenation worse on higher PEEP Some dyssynchrony Plan rocuronium x1 dose, watch O2 saturation closely If remains hypoxemic will adjust PEEP up and administer cisatracurium  Roselie Awkward, MD [place] PCCM Pager: [phone] Cell: [phone] After 3pm or if no response, call [phone]

## 2017-12-08 NOTE — Progress Notes (Signed)
Patient arrived intubated to 2MW from [phone]. Patient was given 2mg  of versed, 100 of fentanyl, 20 of etomidate, and 100 of succinylcholine. Will continue to monitor closely. Modena Morrow E, RN 12/08/2017 11:17 AM

## 2017-12-08 NOTE — Progress Notes (Signed)
RT assisted in repositioning the proned patient.  Head turned to the Right.  ETT remains secure and stable at 21 cm on on the right.  Pt tolerated well.

## 2017-12-08 NOTE — Progress Notes (Signed)
@  0540 Pt noted very confused pulled IV line out. Kept saying she can`t breathe. O2sats at 70% RA. Pt placed on  5 liters non compliant keeps taking it off. Become combative and verbally abusive. 0549 called Rapid response and respiratory therapist. On call MD made aware and MD came at bedside to eval pt. At [phone] pt passed out and code blue initiated. @0644  pt was intubated. @0700  Pt was transferred to 2M02. Report given to the charge nurse at bedside. Called Kashlyn Salinas mother no answer, left a message to call back ASAP.

## 2017-12-08 NOTE — Progress Notes (Signed)
[facility] PCCM  ABG shows improved oxygenation post prone positioning Still acidemic Plateau 31 cm H20 on my check  plan Increase RR to 30 Continue ARDS protocol Wean FiO2  Roselie Awkward, MD [place] PCCM Pager: [phone] Cell: [phone] After 3pm or if no response, call [phone]

## 2017-12-08 NOTE — Progress Notes (Signed)
[facility]  Brought to the ICU overnight from floor.  She was admitted on 5/1 with cough, chest pain, nausea, vomiting.  She has an extensive history including IV drug use, Crohn's disease.  Noted diarrhea on admission.    Past Medical History:  Diagnosis Date  . Abdominal pain, unspecified site 03/24/2009  . 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 ADHD 12/02/2009  . Allergic rhinitis, cause unspecified 01/21/2011  . ANXIETY 03/24/2009  . B12 DEFICIENCY 04/28/2009  . Bronchitis 12/2017  . BURSITIS, RIGHT KNEE 02/05/2010  . Cellulitis and abscess of leg, except foot 02/05/2010  . Cervicalgia 12/02/2009  . COMMON MIGRAINE 02/05/2010  . CROHN'S [facility] INTESTINE 05/19/2009  . ECZEMA 05/20/2010  . Endometriosis 08/04/2011  . Fibromyalgia   . GERD 03/24/2009  . HEADACHE, CHRONIC 03/24/2009  . HYPERTENSION 03/24/2009  . Osteoarthritis   . OTITIS MEDIA, LEFT 08/12/2010  . SMOKER 12/02/2009  . Spine pain 01/21/2011   neck and thoracic spine  . VITAMIN B1 DEFICIENCY 09/21/2009  . Wheezing 08/12/2010   Vitals:   12/08/17 0743 12/08/17 0749 12/08/17 0759 12/08/17 0800  BP:    104/71  Pulse:  99  94  Resp:  20  (!) 21  Temp:      TempSrc:      SpO2:  90%  92%  Weight:   126 lb 15.8 oz (57.6 kg)   Height: 5' 2"  (1.575 m)      Vent Mode: PRVC FiO2 (%):  [100 %] 100 % Set Rate:  [20 bmp] 20 bmp Vt Set:  [470 mL] 470 mL PEEP:  [5 cmH20] 5 cmH20    Intake/Output Summary (Last 24 hours) at 12/08/2017 1011 Last data filed at 12/08/2017 1000 Gross per 24 hour  Intake 3546.54 ml  Output -  Net 3546.54 ml    General:  In bed on vent HENT: NCAT ETT in place PULM: Crackles/rhonchi but no wheezing B, vent supported breathing CV: RRR, no mgr, 2+ pulses radial/DP GI: BS+, soft, nontender MSK: normal bulk and tone Derm: warm and well perfused Neuro: sedated on vent    CBC    Component Value Date/Time   WBC 36.2 (H) 12/06/2017 1957   RBC 4.35 12/06/2017 1957   HGB 10.9 (L) 12/06/2017 1957   HCT 33.4 (L)  12/06/2017 1957   PLT 491 (H) 12/06/2017 1957   MCV 76.8 (L) 12/06/2017 1957   MCH 25.1 (L) 12/06/2017 1957   MCHC 32.6 12/06/2017 1957   RDW 17.5 (H) 12/06/2017 1957   LYMPHSABS 2.9 12/06/2017 1957   MONOABS 2.2 (H) 12/06/2017 1957   EOSABS 0.0 12/06/2017 1957   BASOSABS 0.0 12/06/2017 1957    BMET    Component Value Date/Time   NA 142 12/07/2017 1611   K 2.8 (L) 12/07/2017 1611   CL 120 (H) 12/07/2017 1611   CO2 12 (L) 12/07/2017 1611   GLUCOSE 174 (H) 12/07/2017 1611   BUN 12 12/07/2017 1611   CREATININE 1.63 (H) 12/07/2017 1611   CREATININE 0.87 11/05/2013 1244   CALCIUM 8.1 (L) 12/07/2017 1611   CALCIUM (LL) 10/22/2010 0448    5.9 QNS FOR REPEAT CRITICAL RESULT CALLED TO, READ BACK BY AND VERIFIED WITH: MARTIN,M. RN AT 1235 ON 10/25/10 BY GILLESPIE,B.   GFRNONAA 40 (L) 12/07/2017 1611   GFRNONAA 88 11/05/2013 1244   GFRAA 46 (L) 12/07/2017 1611   GFRAA >89 11/05/2013 1244   Lactic acid is 1.1  CXR bilateral infiltrates worse LUL  Resp viral panel: rhinovirus  Impression/plan:  Acute respiratory failure with hypoxemia: Severe CAP from rhinovirus as CXR clear on admission Continue full vent support, sedation per PAD protocol, changed to pressure trigger, misses fewer breaths with this IV drug use: continue to monitor cultures  Asthma: not wheezing stop solumedrol continue bronchdilators, add brovana/pulmicort Nutrition: start tube feedings today Hypotension: presumably sedation related, will bolus 1 liter saline, but if no improvement start vasopressors, measure CVP Crohns: not on medications at home per mom, has chronic every day diarrhea Non-anion gap metabolic acidosis: due to diarrhea, not in shock, lactic acid normal, UOP normal, continue sodium bicarb infusion Severe hypokalemia (in presence of acidosis, impressive): aggressive repletion today, repeat BMET later today Acute encephalopathy due to severe CAP/hypoxemia: cancel head CT  Mother updated bedside by  me  My cc time 37 minutes  Roselie Awkward, MD [place] [facility] Pager: [phone] Cell: [phone] After 3pm or if no response, call [phone]

## 2017-12-08 NOTE — Progress Notes (Signed)
CRITICAL VALUE ALERT  Critical Value:  K:2.4, Ca: 6.2, Trop: 0.31, WBC: 54   Date & Time Notied: 12/08/17 @ 0940  Provider Notified: Dr. Lake Bells  @ [phone]  Orders Received/Actions taken: yes

## 2017-12-09 ENCOUNTER — Inpatient Hospital Stay (HOSPITAL_COMMUNITY): Payer: Medicaid Other

## 2017-12-09 ENCOUNTER — Encounter (HOSPITAL_COMMUNITY): Payer: Self-pay | Admitting: Pulmonary Disease

## 2017-12-09 DIAGNOSIS — J0100 Acute maxillary sinusitis, unspecified: Secondary | ICD-10-CM

## 2017-12-09 DIAGNOSIS — J40 Bronchitis, not specified as acute or chronic: Secondary | ICD-10-CM

## 2017-12-09 DIAGNOSIS — I361 Nonrheumatic tricuspid (valve) insufficiency: Secondary | ICD-10-CM

## 2017-12-09 LAB — ECHOCARDIOGRAM COMPLETE
Height: 62 in
Weight: 2141.11 oz

## 2017-12-09 LAB — POCT I-STAT 3, ART BLOOD GAS (G3+)
Acid-base deficit: 4 mmol/L — ABNORMAL HIGH (ref 0.0–2.0)
Bicarbonate: 21 mmol/L (ref 20.0–28.0)
O2 Saturation: 98 %
Patient temperature: 96.1
TCO2: 22 mmol/L (ref 22–32)
pCO2 arterial: 34.9 mmHg (ref 32.0–48.0)
pH, Arterial: 7.381 (ref 7.350–7.450)
pO2, Arterial: 94 mmHg (ref 83.0–108.0)

## 2017-12-09 LAB — BLOOD GAS, ARTERIAL
Acid-base deficit: 1.5 mmol/L (ref 0.0–2.0)
Bicarbonate: 22.5 mmol/L (ref 20.0–28.0)
Drawn by: 517021
FIO2: 0.6
MECHVT: 400 mL
O2 Saturation: 96.7 %
PEEP: 14 cmH2O
Patient temperature: 98.6
RATE: 30 resp/min
pCO2 arterial: 36.8 mmHg (ref 32.0–48.0)
pH, Arterial: 7.403 (ref 7.350–7.450)
pO2, Arterial: 92 mmHg (ref 83.0–108.0)

## 2017-12-09 LAB — GLUCOSE, CAPILLARY
Glucose-Capillary: 115 mg/dL — ABNORMAL HIGH (ref 65–99)
Glucose-Capillary: 123 mg/dL — ABNORMAL HIGH (ref 65–99)
Glucose-Capillary: 123 mg/dL — ABNORMAL HIGH (ref 65–99)
Glucose-Capillary: 127 mg/dL — ABNORMAL HIGH (ref 65–99)
Glucose-Capillary: 128 mg/dL — ABNORMAL HIGH (ref 65–99)
Glucose-Capillary: 155 mg/dL — ABNORMAL HIGH (ref 65–99)

## 2017-12-09 LAB — PHOSPHORUS
Phosphorus: 3.5 mg/dL (ref 2.5–4.6)
Phosphorus: 3.7 mg/dL (ref 2.5–4.6)

## 2017-12-09 LAB — BASIC METABOLIC PANEL
Anion gap: 8 (ref 5–15)
Anion gap: 8 (ref 5–15)
BUN: 13 mg/dL (ref 6–20)
BUN: 18 mg/dL (ref 6–20)
CO2: 23 mmol/L (ref 22–32)
CO2: 26 mmol/L (ref 22–32)
Calcium: 6.2 mg/dL — CL (ref 8.9–10.3)
Calcium: 6.3 mg/dL — CL (ref 8.9–10.3)
Chloride: 119 mmol/L — ABNORMAL HIGH (ref 101–111)
Chloride: 114 mmol/L — ABNORMAL HIGH (ref 101–111)
Creatinine, Ser: 1.32 mg/dL — ABNORMAL HIGH (ref 0.44–1.00)
Creatinine, Ser: 1.46 mg/dL — ABNORMAL HIGH (ref 0.44–1.00)
GFR calc Af Amer: 59 mL/min — ABNORMAL LOW (ref >60)
GFR calc Af Amer: 52 mL/min — ABNORMAL LOW (ref >60)
GFR calc non Af Amer: 51 mL/min — ABNORMAL LOW (ref >60)
GFR calc non Af Amer: 45 mL/min — ABNORMAL LOW (ref >60)
Glucose, Bld: 116 mg/dL — ABNORMAL HIGH (ref 65–99)
Glucose, Bld: 158 mg/dL — ABNORMAL HIGH (ref 65–99)
Potassium: 3 mmol/L — ABNORMAL LOW (ref 3.5–5.1)
Potassium: 3.6 mmol/L (ref 3.5–5.1)
Sodium: 150 mmol/L — ABNORMAL HIGH (ref 135–145)
Sodium: 148 mmol/L — ABNORMAL HIGH (ref 135–145)

## 2017-12-09 LAB — CBC
HCT: 25.9 % — ABNORMAL LOW (ref 36.0–46.0)
Hemoglobin: 8.3 g/dL — ABNORMAL LOW (ref 12.0–15.0)
MCH: 24.1 pg — ABNORMAL LOW (ref 26.0–34.0)
MCHC: 32 g/dL (ref 30.0–36.0)
MCV: 75.1 fL — ABNORMAL LOW (ref 78.0–100.0)
Platelets: 390 10*3/uL (ref 150–400)
RBC: 3.45 MIL/uL — ABNORMAL LOW (ref 3.87–5.11)
RDW: 17.8 % — ABNORMAL HIGH (ref 11.5–15.5)
WBC: 31.7 10*3/uL — ABNORMAL HIGH (ref 4.0–10.5)

## 2017-12-09 LAB — MAGNESIUM
Magnesium: 2.1 mg/dL (ref 1.7–2.4)
Magnesium: 2.1 mg/dL (ref 1.7–2.4)

## 2017-12-09 IMAGING — DX DG CHEST 1V PORT
1 series · 1 of 1 positions shown · non-contrast
Comparison: Chest x-ray from same day at 5 a.m.

CLINICAL DATA: ET tube placement.

EXAM:
PORTABLE CHEST 1 VIEW

[chest ap]
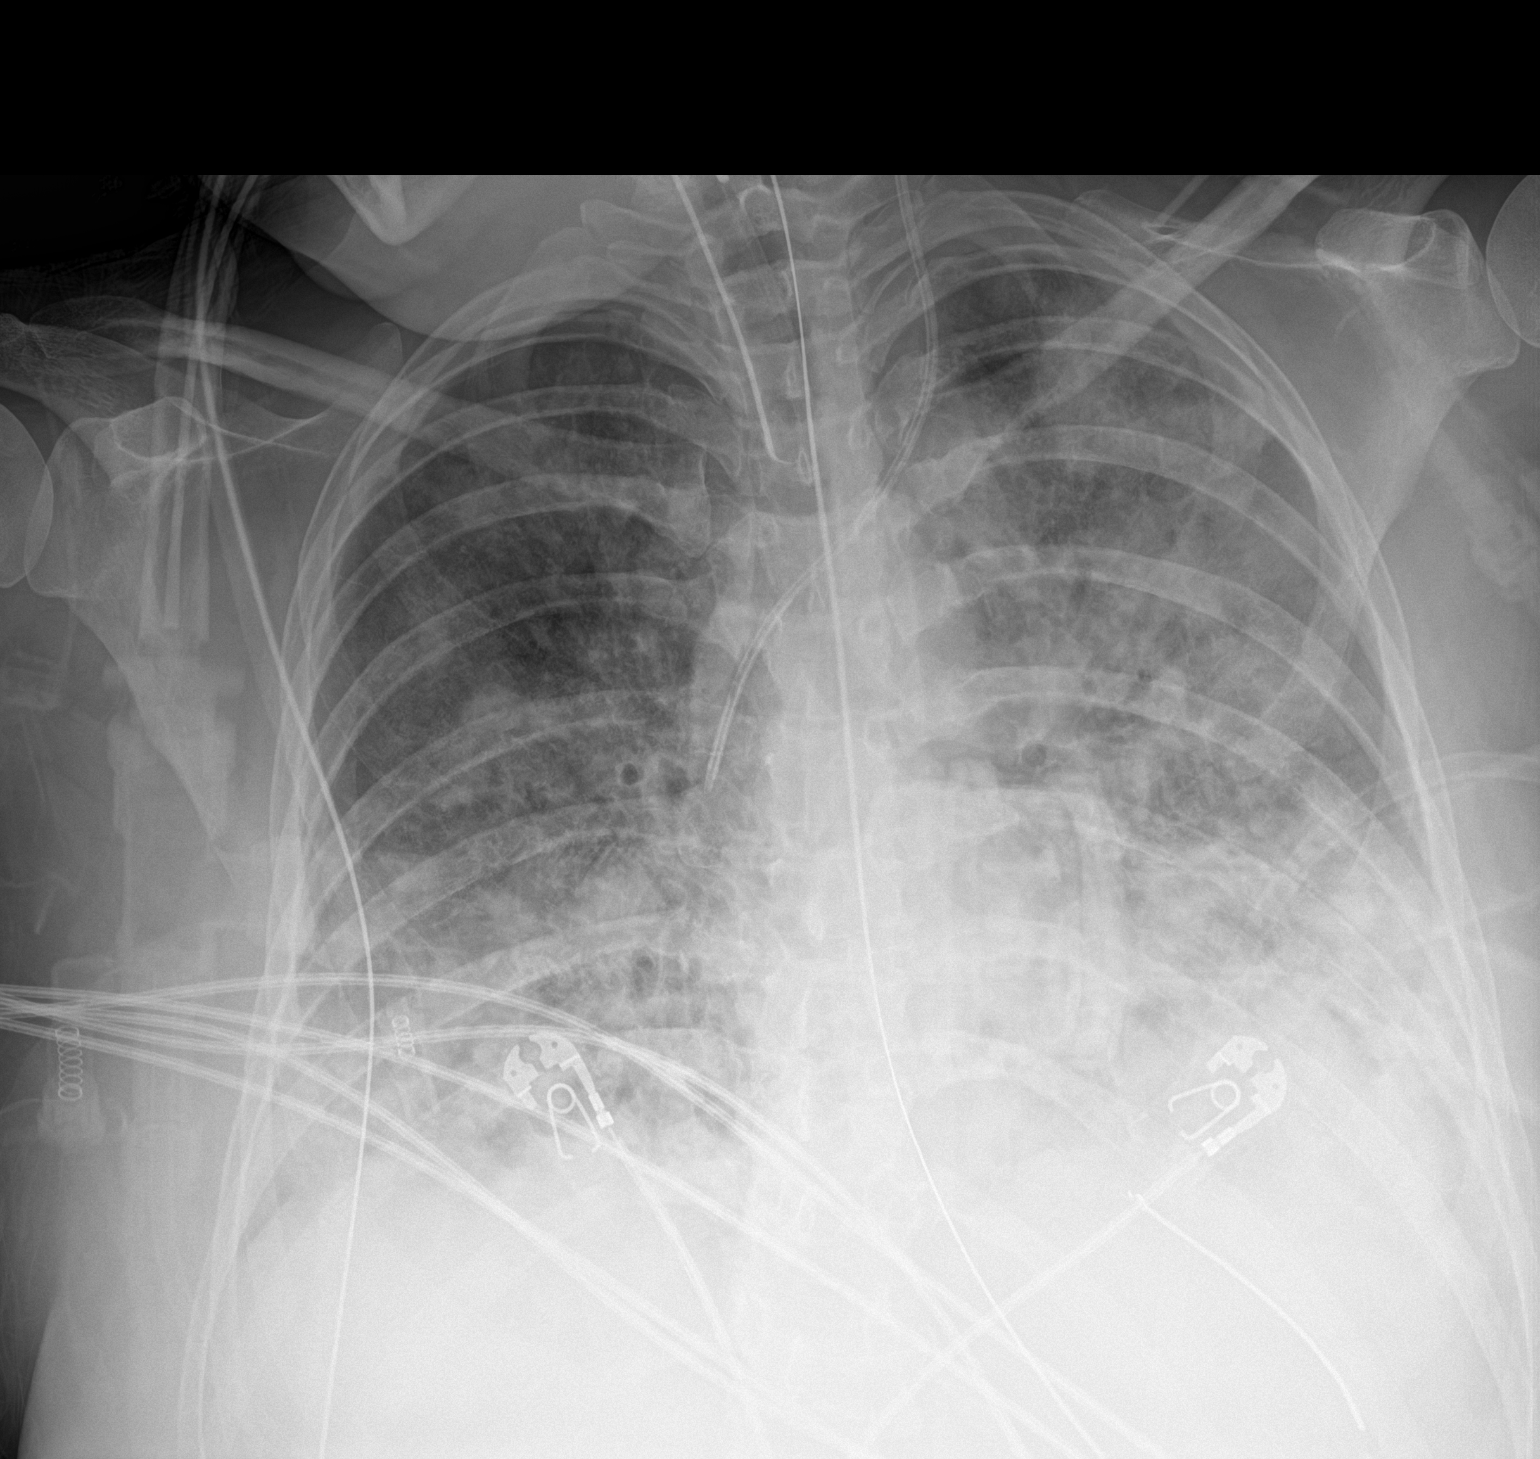

[1 of 1 positions shown; findings below may reference images not displayed]

FINDINGS: Interval advancement of the endotracheal tube with the tip now
cm above the carina. Unchanged left internal jugular central venous
catheter and enteric tube. Stable obscuration of the cardiac
silhouette. Diffuse, lower lobe predominant airspace disease
bilaterally, worse on the left, is similar to slightly improved. No
pneumothorax. No acute osseous abnormality.
IMPRESSION: 1. Diffuse bilateral airspace disease, worse on the left, is stable
to slightly improved, particularly in the left upper lobe.

## 2017-12-09 IMAGING — DX DG CHEST 1V PORT
1 series · 1 of 1 positions shown · non-contrast
Comparison: Chest x-ray [DATE].

CLINICAL DATA: 36-year-old female with history of acute respiratory
failure.

EXAM:
PORTABLE CHEST 1 VIEW

[chest ap]
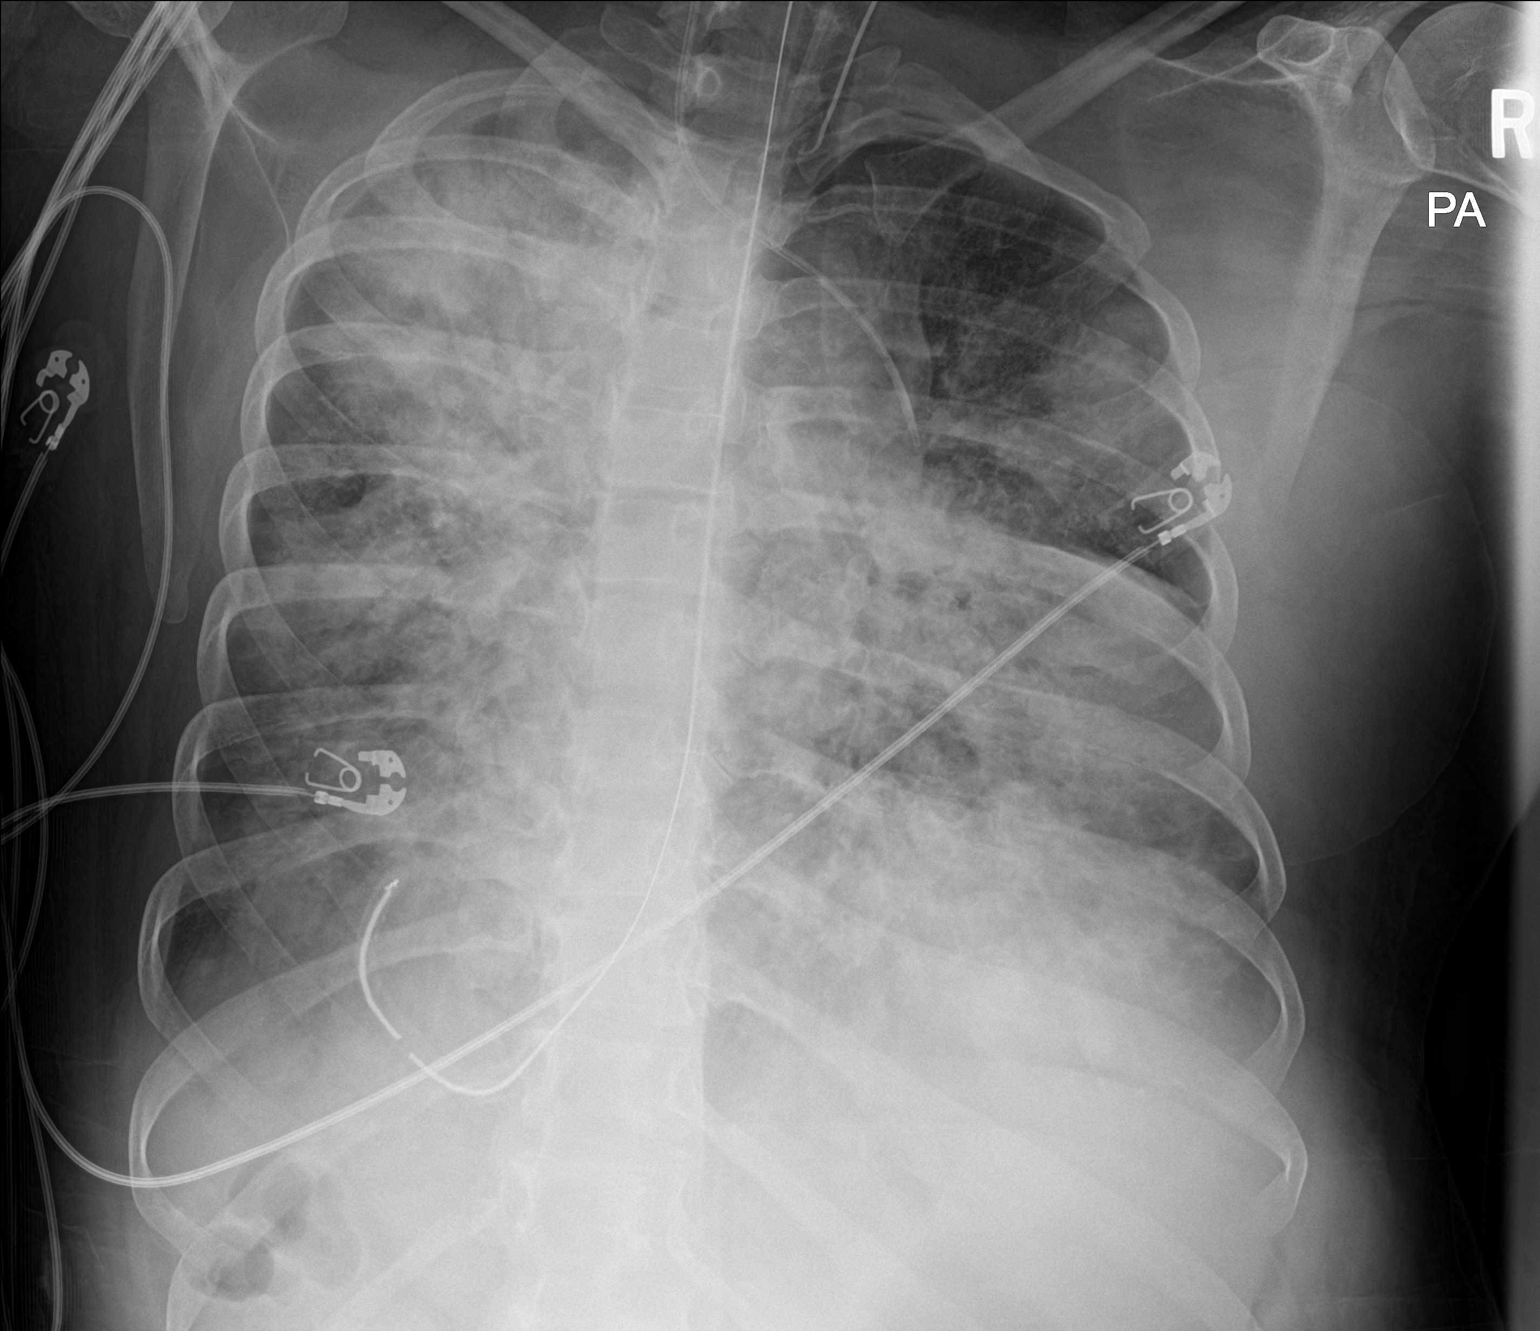

[1 of 1 positions shown; findings below may reference images not displayed]

FINDINGS: An endotracheal tube is in place with tip 9.3 cm above the carina.
There is a left-sided internal jugular central venous catheter with
tip terminating in the mid superior vena cava. Nasogastric tube
coiled in the proximal stomach. Widespread but patchy and
asymmetrically distributed interstitial and airspace disease
throughout the lungs bilaterally concerning for severe multilobar
pneumonia. Cardiac silhouette is obscured. Probable small right
pleural effusion.
IMPRESSION: 1. Support apparatus, as above.
2. The appearance of the lungs is most compatible with severe
multilobar pneumonia. Probable small right pleural effusion.

## 2017-12-09 MED ORDER — MIDAZOLAM BOLUS VIA INFUSION
1.0000 mg | INTRAVENOUS | Status: DC | PRN
  Administered 2017-12-09: 2 mg via INTRAVENOUS
  Filled 2017-12-09: qty 2

## 2017-12-09 MED ORDER — FREE WATER
200.0000 mL | Status: DC
  Administered 2017-12-09 – 2017-12-12 (×19): 200 mL

## 2017-12-09 MED ORDER — ARTIFICIAL TEARS OPHTHALMIC OINT
1.0000 "application " | TOPICAL_OINTMENT | Freq: Three times a day (TID) | OPHTHALMIC | Status: DC
  Administered 2017-12-09 – 2017-12-12 (×9): 1 via OPHTHALMIC

## 2017-12-09 MED ORDER — FENTANYL CITRATE (PF) 100 MCG/2ML IJ SOLN
100.0000 ug | Freq: Once | INTRAMUSCULAR | Status: AC | PRN
  Administered 2017-12-09: 100 ug via INTRAVENOUS

## 2017-12-09 MED ORDER — CALCIUM GLUCONATE 10 % IV SOLN
2.0000 g | Freq: Once | INTRAVENOUS | Status: AC
  Administered 2017-12-09: 2 g via INTRAVENOUS
  Filled 2017-12-09: qty 20

## 2017-12-09 MED ORDER — SODIUM CHLORIDE 0.9 % IV SOLN
2.0000 mg/h | INTRAVENOUS | Status: DC
  Administered 2017-12-09: 5 mg/h via INTRAVENOUS
  Administered 2017-12-10: 4 mg/h via INTRAVENOUS
  Administered 2017-12-10: 5 mg/h via INTRAVENOUS
  Administered 2017-12-10 – 2017-12-11 (×3): 10 mg/h via INTRAVENOUS
  Administered 2017-12-11: 5 mg/h via INTRAVENOUS
  Administered 2017-12-12 (×2): 10 mg/h via INTRAVENOUS
  Filled 2017-12-09 (×9): qty 10

## 2017-12-09 MED ORDER — MIDAZOLAM BOLUS VIA INFUSION
2.0000 mg | INTRAVENOUS | Status: DC | PRN
  Administered 2017-12-09 – 2017-12-10 (×3): 2 mg via INTRAVENOUS
  Filled 2017-12-09: qty 2

## 2017-12-09 MED ORDER — FENTANYL BOLUS VIA INFUSION
50.0000 ug | INTRAVENOUS | Status: DC | PRN
  Administered 2017-12-09 – 2017-12-12 (×4): 50 ug via INTRAVENOUS
  Filled 2017-12-09: qty 50

## 2017-12-09 MED ORDER — PANTOPRAZOLE SODIUM 40 MG PO PACK
40.0000 mg | PACK | Freq: Two times a day (BID) | ORAL | Status: DC
  Administered 2017-12-09 – 2017-12-10 (×3): 40 mg
  Filled 2017-12-09 (×4): qty 20

## 2017-12-09 MED ORDER — FENTANYL CITRATE (PF) 100 MCG/2ML IJ SOLN: 50.0000 ug | Freq: Once | INTRAMUSCULAR | Status: DC

## 2017-12-09 MED ORDER — SODIUM CHLORIDE 0.9 % IV SOLN
2.0000 g | Freq: Once | INTRAVENOUS | Status: AC
  Administered 2017-12-09: 2 g via INTRAVENOUS
  Filled 2017-12-09: qty 20

## 2017-12-09 MED ORDER — FENTANYL 2500MCG IN NS 250ML (10MCG/ML) PREMIX INFUSION: 25.0000 ug/h | INTRAVENOUS | Status: DC

## 2017-12-09 MED ORDER — POTASSIUM CHLORIDE 10 MEQ/100ML IV SOLN
10.0000 meq | INTRAVENOUS | Status: AC
  Administered 2017-12-09 – 2017-12-10 (×3): 10 meq via INTRAVENOUS
  Filled 2017-12-09 (×3): qty 100

## 2017-12-09 MED ORDER — THIAMINE HCL 100 MG/ML IJ SOLN
100.0000 mg | Freq: Every day | INTRAMUSCULAR | Status: DC
  Administered 2017-12-09 – 2017-12-10 (×2): 100 mg via INTRAVENOUS
  Filled 2017-12-09: qty 1
  Filled 2017-12-09: qty 2
  Filled 2017-12-09: qty 1

## 2017-12-09 MED ORDER — ROCURONIUM BROMIDE 50 MG/5ML IV SOLN
55.0000 mg | Freq: Once | INTRAVENOUS | Status: AC
  Administered 2017-12-09: 55 mg via INTRAVENOUS
  Filled 2017-12-09: qty 5.5

## 2017-12-09 MED ORDER — FENTANYL 2500MCG IN NS 250ML (10MCG/ML) PREMIX INFUSION: 100.0000 ug/h | INTRAVENOUS | Status: DC

## 2017-12-09 MED ORDER — CISATRACURIUM BOLUS VIA INFUSION
0.1000 mg/kg | Freq: Once | INTRAVENOUS | Status: AC
  Administered 2017-12-09: 6.1 mg via INTRAVENOUS
  Filled 2017-12-09: qty 7

## 2017-12-09 MED ORDER — POTASSIUM CHLORIDE 20 MEQ/15ML (10%) PO SOLN
40.0000 meq | ORAL | Status: AC
  Administered 2017-12-09 (×2): 40 meq
  Filled 2017-12-09 (×2): qty 30

## 2017-12-09 MED ORDER — FUROSEMIDE 10 MG/ML IJ SOLN
40.0000 mg | Freq: Four times a day (QID) | INTRAMUSCULAR | Status: AC
  Administered 2017-12-09 (×2): 40 mg via INTRAVENOUS
  Filled 2017-12-09 (×2): qty 4

## 2017-12-09 MED ORDER — ROCURONIUM BROMIDE 50 MG/5ML IV SOLN: 1.0000 mg/kg | Freq: Once | INTRAVENOUS | Status: DC

## 2017-12-09 MED ORDER — SODIUM CHLORIDE 0.9 % IV SOLN
3.0000 ug/kg/min | INTRAVENOUS | Status: DC
  Administered 2017-12-09: 3 ug/kg/min via INTRAVENOUS
  Filled 2017-12-09 (×2): qty 20

## 2017-12-09 MED ORDER — FENTANYL CITRATE (PF) 2500 MCG/50ML IJ SOLN
0.0000 ug/h | INTRAMUSCULAR | Status: DC
  Administered 2017-12-09: 400 ug/h via INTRAVENOUS
  Administered 2017-12-10: 200 ug/h via INTRAVENOUS
  Administered 2017-12-10 – 2017-12-11 (×2): 400 ug/h via INTRAVENOUS
  Administered 2017-12-12: 225 ug/h via INTRAVENOUS
  Administered 2017-12-13: 400 ug/h via INTRAVENOUS
  Administered 2017-12-13: 200 ug/h via INTRAVENOUS
  Administered 2017-12-13: 50 ug via INTRAVENOUS
  Administered 2017-12-13: 400 ug/h via INTRAVENOUS
  Filled 2017-12-09 (×9): qty 100

## 2017-12-09 MED ORDER — SODIUM CHLORIDE 0.9 % IV SOLN
0.0000 mg/h | INTRAVENOUS | Status: DC
  Administered 2017-12-09: 5 mg/h via INTRAVENOUS
  Filled 2017-12-09: qty 10

## 2017-12-09 MED ORDER — SODIUM CHLORIDE 0.9 % IV SOLN
1.0000 g | INTRAVENOUS | Status: DC
  Administered 2017-12-09 – 2017-12-11 (×3): 1 g via INTRAVENOUS
  Filled 2017-12-09 (×4): qty 10

## 2017-12-09 MED ORDER — MIDAZOLAM HCL 2 MG/2ML IJ SOLN
2.0000 mg | Freq: Once | INTRAMUSCULAR | Status: AC
  Administered 2017-12-09: 2 mg via INTRAVENOUS

## 2017-12-09 MED ORDER — MIDAZOLAM HCL 2 MG/2ML IJ SOLN: 2.0000 mg | Freq: Once | INTRAMUSCULAR | Status: DC | PRN

## 2017-12-09 MED ORDER — FOLIC ACID 1 MG PO TABS
1.0000 mg | ORAL_TABLET | Freq: Every day | ORAL | Status: DC
  Administered 2017-12-09 – 2017-12-14 (×6): 1 mg via ORAL
  Filled 2017-12-09 (×9): qty 1

## 2017-12-09 MED ORDER — FENTANYL CITRATE (PF) 100 MCG/2ML IJ SOLN: 100.0000 ug | Freq: Once | INTRAMUSCULAR | Status: DC

## 2017-12-09 MED ORDER — VITAL HIGH PROTEIN PO LIQD
1000.0000 mL | ORAL | Status: DC
  Administered 2017-12-09 – 2017-12-12 (×4): 1000 mL
  Filled 2017-12-09: qty 1000

## 2017-12-09 MED ORDER — FENTANYL BOLUS VIA INFUSION
50.0000 ug | INTRAVENOUS | Status: DC | PRN
  Administered 2017-12-09: 50 ug via INTRAVENOUS
  Filled 2017-12-09: qty 50

## 2017-12-09 NOTE — Progress Notes (Signed)
[place] Progress Note Patient Name: [name] DOB: 1981/07/19 MRN: [id number]   Date of Service  12/09/2017  HPI/Events of Note  Hypokalemia and hypocalcemia  eICU Interventions  Potassium and calcium replaced     Intervention Category Intermediate Interventions: Electrolyte abnormality - evaluation and management  Mehar Sagen 12/09/2017, 5:20 AM

## 2017-12-09 NOTE — Progress Notes (Signed)
PULMONARY / CRITICAL CARE MEDICINE   Name: [name] MRN: [id number] DOB: 03/02/1981    ADMISSION DATE:  12/06/2017 CONSULTATION DATE:  12/08/17  REFERRING MD:  Lonny Prude  CHIEF COMPLAINT:  Dyspnea  BRIEF HISTORY OF PRESENT ILLNESS:   37 y/o female with crohn's, EtOH abuse, chronic narcotic use admitted with cough, chest pain dyspnea.  Developed ARDS either from aspiration pneumonitis or rhinovirus.  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), ADHD (12/02/2009), Allergic rhinitis, cause unspecified (01/21/2011), ANXIETY (03/24/2009), B12 DEFICIENCY (04/28/2009), Bronchitis (12/2017), BURSITIS, RIGHT KNEE (02/05/2010), Cellulitis and abscess of leg, except foot (02/05/2010), Cervicalgia (12/02/2009), COMMON MIGRAINE (02/05/2010), CROHN'S DISEASE-SMALL INTESTINE (05/19/2009), ECZEMA (05/20/2010), Endometriosis (08/04/2011), Fibromyalgia, GERD (03/24/2009), HEADACHE, CHRONIC (03/24/2009), HYPERTENSION (03/24/2009), Osteoarthritis, OTITIS MEDIA, LEFT (08/12/2010), SMOKER (12/02/2009), Spine pain (01/21/2011), VITAMIN B1 DEFICIENCY (09/21/2009), and Wheezing (08/12/2010).  REVIEW OF SYSTEMS:   Cannot obtain as patient intubated  SUBJECTIVE:  Prone positioning UOP good Oxygenation improved Sodium bicarbonate drip held  VITAL SIGNS: BP 109/70   Pulse 99   Temp (!) 100.6 F (38.1 C) (Oral)   Resp (!) 30   Ht 5' 2"  (1.575 m)   Wt 133 lb 13.1 oz (60.7 kg)   LMP 12/03/2017 (Exact Date)   SpO2 98%   BMI 24.48 kg/m   HEMODYNAMICS: CVP:  [5 mmHg] 5 mmHg  VENTILATOR SETTINGS: Vent Mode: PRVC FiO2 (%):  [60 %-100 %] 60 % Set Rate:  [26 bmp-30 bmp] 30 bmp Vt Set:  [400 mL] 400 mL PEEP:  [12 cmH20-16 cmH20] 12 cmH20 Plateau Pressure:  [26 cmH20-33 cmH20] 26 cmH20  INTAKE / OUTPUT: I/O last 3 completed shifts: In: 9008.7 [P.O.:620; I.V.:4798.2; NG/GT:600.5; IV Piggyback:2990] Out: 2830 [Urine:2380; Emesis/NG  output:450]  PHYSICAL EXAMINATION:  General:  In bed on vent, prone position HENT: NCAT ETT in place PULM: Crackles B, vent supported breathing CV: pulses intact, cap refill within normal limits GI: prone, exam limited MSK: paralyzed chemically  Neuro: sedated, paralyzed on vent    LABS:  BMET Recent Labs  Lab 12/08/17 0747 12/08/17 1700 12/09/17 0412  NA 146* 149* 150*  K 2.4* 3.7 3.0*  CL 123* 121* 119*  CO2 11* 20* 23  BUN 10 11 13   CREATININE 1.34* 1.30* 1.32*  GLUCOSE 151* 144* 116*    Electrolytes Recent Labs  Lab 12/08/17 0747 12/08/17 1039 12/08/17 1700 12/09/17 0412  CALCIUM 6.2*  --  6.6* 6.2*  MG 1.5* 1.8 2.1 2.1  PHOS 4.3 3.4 4.5 3.5    CBC Recent Labs  Lab 12/06/17 1957 12/08/17 0747 12/09/17 0412  WBC 36.2* 54.0* 31.7*  HGB 10.9* 10.8* 8.3*  HCT 33.4* 34.1* 25.9*  PLT 491* 523* 390    Coag's Recent Labs  Lab 12/08/17 0747  INR 1.35    Sepsis Markers Recent Labs  Lab 12/07/17 0445  12/07/17 1218 12/08/17 0747 12/08/17 0748 12/08/17 0952  LATICACIDVEN  --    < > 1.6  --  1.1 0.6  PROCALCITON 88.50  --   --  32.13  --   --    < > = values in this interval not displayed.    ABG Recent Labs  Lab 12/08/17 2131 12/08/17 2359 12/09/17 0416  PHART 7.377 7.403 7.381  PCO2ART 35.9 36.8 34.9  PO2ART 92.0 92.0 94.0    Liver Enzymes Recent Labs  Lab 12/06/17 1957 12/07/17 1047 12/08/17 0747  AST 14* 20 23  ALT 8*  QUANTITY NOT SUFFICIENT, UNABLE TO PERFORM TEST 12*  ALKPHOS 155* 168* 218*  BILITOT 1.0 QUANTITY NOT SUFFICIENT, UNABLE TO PERFORM TEST 0.4  ALBUMIN 2.2* 2.4* 1.8*    Cardiac Enzymes Recent Labs  Lab 12/08/17 0747 12/08/17 1106  TROPONINI 0.31* <0.03    Glucose Recent Labs  Lab 12/08/17 1102 12/08/17 1619 12/08/17 1927 12/08/17 2324 12/09/17 0412 12/09/17 0729  GLUCAP 112* 137* 163* 147* 115* 123*    Imaging Dg Chest Port 1 View  Result Date: 12/08/2017 CLINICAL DATA:  New central line  placement EXAM: PORTABLE CHEST 1 VIEW COMPARISON:  Portable chest of 12/08/2016 FINDINGS: The tip of the endotracheal tube is approximately 2.1 cm above the carina. A new left IJ central venous line has been inserted with the tip overlying the expected SVC-RA junction. No pneumothorax is seen. There has been interval worsening of diffuse airspace disease most consistent with worsening of edema, although superimposed pneumonia cannot be excluded. NG tube coils in the fundus of the stomach. IMPRESSION: 1. New left central venous line tip overlies expected SVC-RA junction. 2. Worsening of diffuse airspace disease. Probable worsening of edema although superimposed pneumonia cannot be excluded. 3. Endotracheal tube tip 2.1 cm above the carina. Electronically Signed   By: Ivar Drape M.D.   On: 12/08/2017 09:08     STUDIES:  5/2 ct abd> no acute process, bowel looks OK, mild gallbladder distention 5/2 CT sinus> generalized acute sinusitis with fluid levels, possible otomastoiditis  CULTURES: 5/1 blood >  5/3 resp >  5/2 resp viral panel > rhinovirus 5/2 rapid strep throat swab > negative  ANTIBIOTICS: 5/1 ceftriaxone > 5/2 5/2 azithro > 5/2 5/3 vanc > 5/4 5/3 Mero > 5/4  5/4 ceftriaxone >   SIGNIFICANT EVENTS: 5/3 move to ICU, intubated, paralyzed, prone  LINES/TUBES: 5/3 ETT >  5/3 L IJ CVL >  5/3 R radial arterial line >   DISCUSSION: 37 y/o female with crohns, etoh abuse, chronic narcotic use (NO IVDU, chart incorrect) who has ARDS from either rhinovirus or aspiration pneumonitis.   ASSESSMENT / PLAN:  PULMONARY A: ARDS  P:   Turn to supine Hold nimbex Continue ARDS protocol > continue to keep P plateau < 30cm H20, goal paO2 55-65 Diurese aiming for CVP < 4  No wake up assessment Keep deeply sedated CXR port after supine  CARDIOVASCULAR A:  No acute issues P:  Tele Monitor hemodynamics  RENAL A:   Hypokalemia and hypocalcemia Hypernatremia P:   Monitor BMET and  UOP Replace electrolytes as needed KVO fluids Add free water to tube feeding 240m q4h  GASTROINTESTINAL A:   Crohns, history of abdominal surgery but per mother not on any chronic medications for this P:   Advance tube feeding Continue pepcid  HEMATOLOGIC A:   Anemia of chronic disease, no bleeding P:  Monitor for bleeding Transfuse if Hgb < 7gm/dL  INFECTIOUS A:   Severe CAP > from rhinovirus Aspiration pneumonitis possible Sinusitis/possible mastoiditis per CT scan P:   Stop broad spectrum antibiotics, change to ceftriaxone plan 7 days total antibiotics Monitor cultures  ENDOCRINE A:   Mild Hyperglycemia P:   SSI  NEUROLOGIC A:   Need for heavy sedation in setting of severe ARDS Narcotic dependence EtOH use P:   RASS goal: -3 to -4 Change from nimbex protocol (d/c paralytics) to PAD protocol with fentanyl/versed Thiamine/folate   FAMILY  - Updates: mother updated 5/3, not present 5/4 on rounds  - Inter-disciplinary family meet or Palliative  Care meeting due by:  day 7  My cc time 45 minutes  Roselie Awkward, MD [place] [facility] Pager: [phone] Cell: [phone] After 3pm or if no response, call [phone]   12/09/2017, 7:55 AM

## 2017-12-09 NOTE — Progress Notes (Signed)
RT Note- Patient taken prone to supine position without difficulty. Recruitment maneuver performed, continue to wean fio2 and PEEP as tolerated as to ARDS protocol.

## 2017-12-09 NOTE — Progress Notes (Signed)
Echocardiogram 2D Echocardiogram has been performed.  [name] 12/09/2017, 2:04 PM

## 2017-12-09 NOTE — Progress Notes (Signed)
eLink Physician-Brief Progress Note Patient Name: MARLENY FALLER DOB: [birth year] MRN: [id number]   Date of Service  12/09/2017  HPI/Events of Note  hypocalcemia  eICU Interventions  Calcium replaced     Intervention Category Intermediate Interventions: Electrolyte abnormality - evaluation and management  DETERDING,ELIZABETH 12/09/2017, 9:24 PM

## 2017-12-09 NOTE — Progress Notes (Addendum)
CRITICAL VALUE ALERT  Critical Value:  Calcium 6.2  Date & Time Notied:  12/09/2017 05:03  Provider Notified: [place] Received/Actions taken: Electrolytes replaced

## 2017-12-09 NOTE — Progress Notes (Signed)
Wasted 153ml of fentanyl in sink with mary beth. Modena Morrow E, RN 12/09/2017 10:59 AM

## 2017-12-09 NOTE — Progress Notes (Signed)
Pt temp 100.6 F orally. RN notified.

## 2017-12-09 NOTE — Progress Notes (Signed)
RT note-Patient sp02 76%, Fio2 increased 100% and PEEP +14, recruitment maneuver performed and patient is now again paralyzed.

## 2017-12-10 ENCOUNTER — Inpatient Hospital Stay (HOSPITAL_COMMUNITY): Payer: Medicaid Other

## 2017-12-10 DIAGNOSIS — R1084 Generalized abdominal pain: Secondary | ICD-10-CM

## 2017-12-10 LAB — CBC WITH DIFFERENTIAL/PLATELET
Basophils Absolute: 0 10*3/uL (ref 0.0–0.1)
Basophils Relative: 0 %
Eosinophils Absolute: 0 10*3/uL (ref 0.0–0.7)
Eosinophils Relative: 0 %
HCT: 26.1 % — ABNORMAL LOW (ref 36.0–46.0)
Hemoglobin: 8.1 g/dL — ABNORMAL LOW (ref 12.0–15.0)
Lymphocytes Relative: 14 %
Lymphs Abs: 3.5 10*3/uL (ref 0.7–4.0)
MCH: 23.9 pg — ABNORMAL LOW (ref 26.0–34.0)
MCHC: 31 g/dL (ref 30.0–36.0)
MCV: 77 fL — ABNORMAL LOW (ref 78.0–100.0)
Monocytes Absolute: 1.5 10*3/uL — ABNORMAL HIGH (ref 0.1–1.0)
Monocytes Relative: 6 %
Neutro Abs: 19.9 10*3/uL — ABNORMAL HIGH (ref 1.7–7.7)
Neutrophils Relative %: 80 %
Platelets: 381 10*3/uL (ref 150–400)
RBC: 3.39 MIL/uL — ABNORMAL LOW (ref 3.87–5.11)
RDW: 17.9 % — ABNORMAL HIGH (ref 11.5–15.5)
WBC: 24.9 10*3/uL — ABNORMAL HIGH (ref 4.0–10.5)

## 2017-12-10 LAB — GLUCOSE, CAPILLARY
Glucose-Capillary: 104 mg/dL — ABNORMAL HIGH (ref 65–99)
Glucose-Capillary: 118 mg/dL — ABNORMAL HIGH (ref 65–99)
Glucose-Capillary: 123 mg/dL — ABNORMAL HIGH (ref 65–99)
Glucose-Capillary: 135 mg/dL — ABNORMAL HIGH (ref 65–99)
Glucose-Capillary: 105 mg/dL — ABNORMAL HIGH (ref 65–99)
Glucose-Capillary: 118 mg/dL — ABNORMAL HIGH (ref 65–99)

## 2017-12-10 LAB — BASIC METABOLIC PANEL
Anion gap: 8 (ref 5–15)
BUN: 17 mg/dL (ref 6–20)
CO2: 28 mmol/L (ref 22–32)
Calcium: 6.9 mg/dL — ABNORMAL LOW (ref 8.9–10.3)
Chloride: 113 mmol/L — ABNORMAL HIGH (ref 101–111)
Creatinine, Ser: 1.33 mg/dL — ABNORMAL HIGH (ref 0.44–1.00)
GFR calc Af Amer: 59 mL/min — ABNORMAL LOW (ref >60)
GFR calc non Af Amer: 51 mL/min — ABNORMAL LOW (ref >60)
Glucose, Bld: 109 mg/dL — ABNORMAL HIGH (ref 65–99)
Potassium: 3.6 mmol/L (ref 3.5–5.1)
Sodium: 149 mmol/L — ABNORMAL HIGH (ref 135–145)

## 2017-12-10 LAB — CULTURE, RESPIRATORY

## 2017-12-10 LAB — TRIGLYCERIDES: Triglycerides: 119 mg/dL (ref <150)

## 2017-12-10 LAB — CULTURE, RESPIRATORY W GRAM STAIN: Culture: NO GROWTH

## 2017-12-10 IMAGING — DX DG ABD PORTABLE 1V
1 series · 1 of 1 positions shown · non-contrast
Comparison: No priors.

CLINICAL DATA: 36-year-old female with history of constipation.

EXAM:
PORTABLE ABDOMEN - 1 VIEW

[abdomen kub]
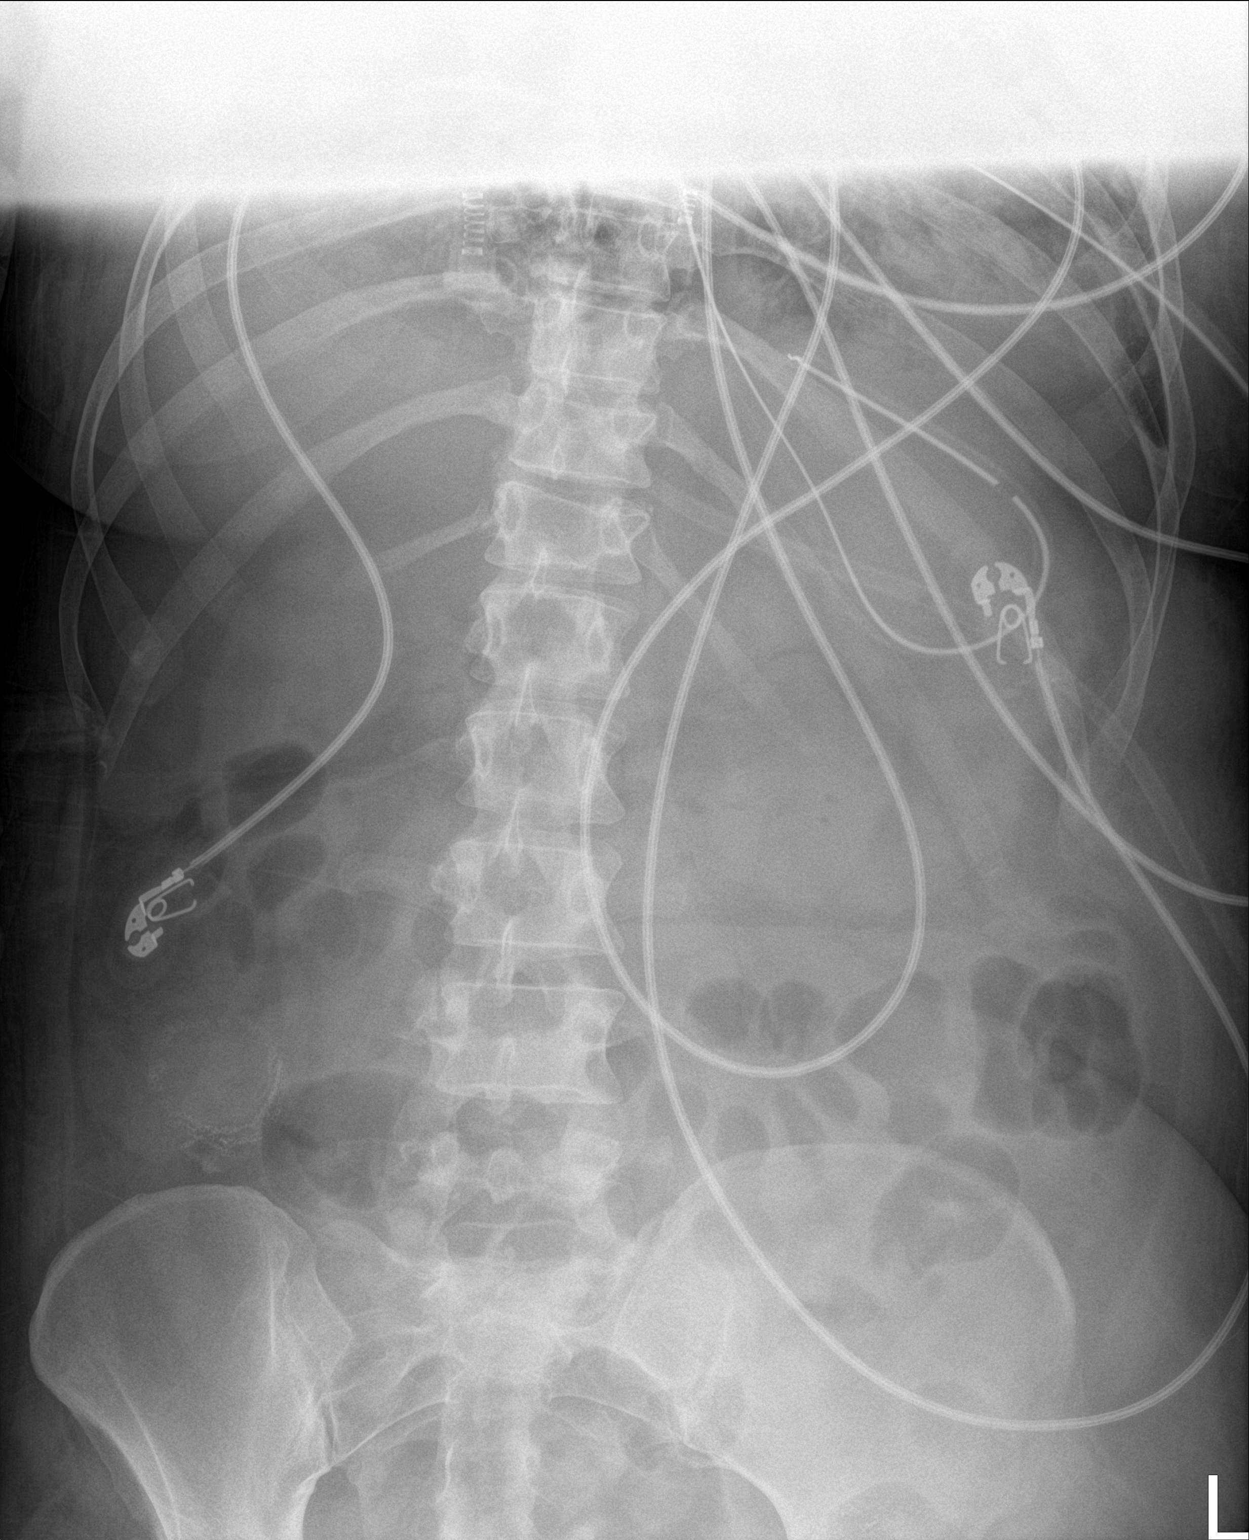

[1 of 1 positions shown; findings below may reference images not displayed]

FINDINGS: Nasogastric tube in position with tip in the proximal stomach.
Single view of the abdomen demonstrates some gas and stool
throughout the colon and rectum. No pathologic dilatation of small
bowel or colon. No gross pneumoperitoneum on this single supine
view. Suture line noted in the right lower quadrant. Overall, stool
burden does not appear excessive.
IMPRESSION: 1. Nonobstructive bowel gas pattern.
2. No pneumoperitoneum.
3. Stool burden does not appear excessive despite the reported
history of constipation.

## 2017-12-10 IMAGING — DX DG CHEST 1V PORT
1 series · 1 of 1 positions shown · non-contrast
Comparison: Chest x-ray [DATE].

CLINICAL DATA: 36-year-old female with history of acute respiratory
failure with hypoxemia.

EXAM:
PORTABLE CHEST 1 VIEW

[chest]
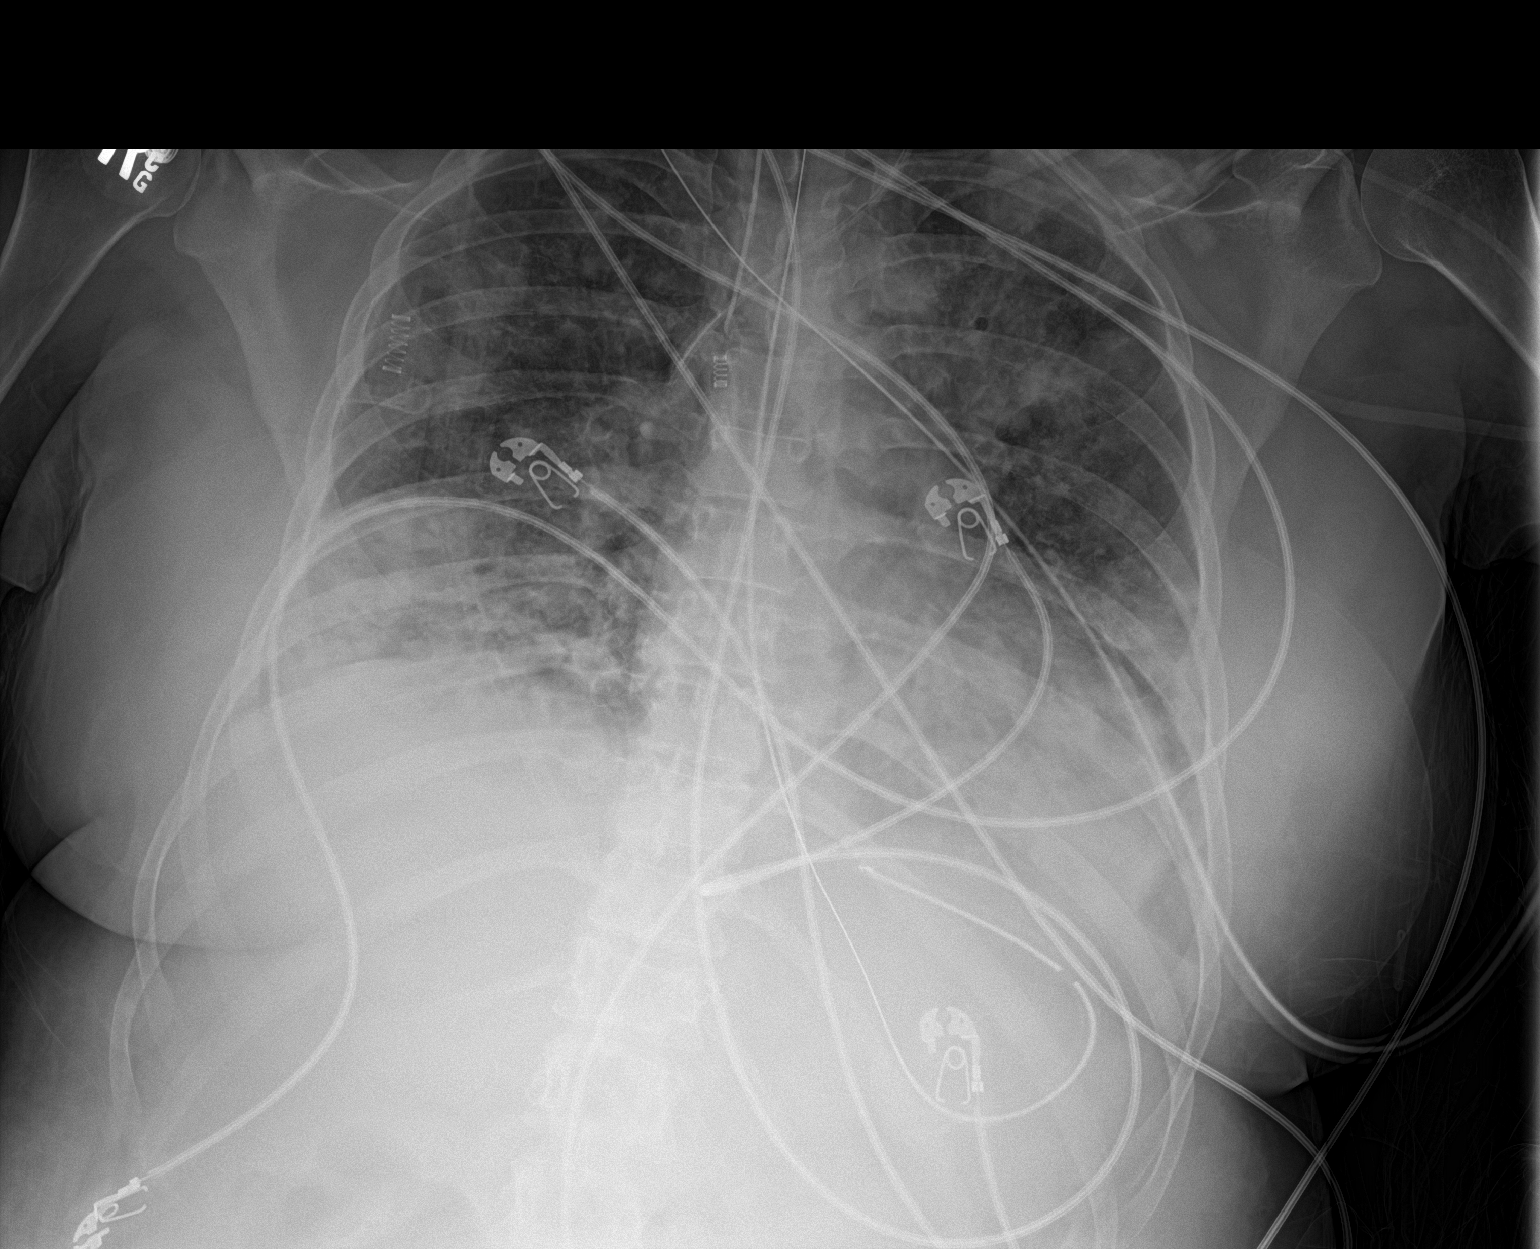

[1 of 1 positions shown; findings below may reference images not displayed]

FINDINGS: An endotracheal tube is in place with tip 2.2 cm above the carina.
There is a left-sided internal jugular central venous catheter with
tip terminating in the mid superior vena cava. Nasogastric tube
extends into the stomach with the tip coiled in the proximal
stomach. Lung volumes are low. Diffuse but patchy interstitial and
airspace disease is again noted asymmetrically distributed
throughout the lungs bilaterally, most confluent throughout the lung
bases and in the medial aspect of the left upper lobe. Slight
improved aeration in the left upper lobe compared with yesterday's
examination. Pulmonary vasculature is obscured. Heart size appears
normal. Upper mediastinal contours are largely obscured.
IMPRESSION: 1. Support apparatus, as above.
2. Patchy asymmetrically distributed interstitial and airspace
disease in the lungs bilaterally, similar to the prior study,
favored to reflect severe multilobar pneumonia. Overall, aeration
has slightly improved, particularly in the left upper lobe.

## 2017-12-10 MED ORDER — PROPOFOL 1000 MG/100ML IV EMUL
5.0000 ug/kg/min | INTRAVENOUS | Status: DC
  Administered 2017-12-10: 20 ug/kg/min via INTRAVENOUS
  Filled 2017-12-10: qty 100

## 2017-12-10 MED ORDER — ORAL CARE MOUTH RINSE
15.0000 mL | OROMUCOSAL | Status: DC
  Administered 2017-12-10 – 2017-12-13 (×33): 15 mL via OROMUCOSAL

## 2017-12-10 MED ORDER — SODIUM CHLORIDE 0.9 % IV SOLN
3.0000 ug/kg/min | INTRAVENOUS | Status: DC
  Administered 2017-12-10: 3 ug/kg/min via INTRAVENOUS
  Administered 2017-12-10: 4.5 ug/kg/min via INTRAVENOUS
  Administered 2017-12-11: 4 ug/kg/min via INTRAVENOUS
  Filled 2017-12-10 (×2): qty 20

## 2017-12-10 MED ORDER — FUROSEMIDE 10 MG/ML IJ SOLN
40.0000 mg | Freq: Four times a day (QID) | INTRAMUSCULAR | Status: AC
  Administered 2017-12-10 (×2): 40 mg via INTRAVENOUS
  Filled 2017-12-10 (×3): qty 4

## 2017-12-10 MED ORDER — CHLORHEXIDINE GLUCONATE 0.12% ORAL RINSE (MEDLINE KIT)
15.0000 mL | Freq: Two times a day (BID) | OROMUCOSAL | Status: DC
  Administered 2017-12-10 – 2017-12-18 (×14): 15 mL via OROMUCOSAL

## 2017-12-10 NOTE — Progress Notes (Signed)
Pt temp 102.4 F orally, RN notified.

## 2017-12-10 NOTE — Progress Notes (Signed)
PULMONARY / CRITICAL CARE MEDICINE   Name: [name] MRN: [id number] DOB: April 14, 1981    ADMISSION DATE:  12/06/2017 CONSULTATION DATE:  12/08/17  REFERRING MD:  Lonny Prude  CHIEF COMPLAINT:  Dyspnea  BRIEF HISTORY OF PRESENT ILLNESS:   37 y/o female with crohn's, EtOH abuse, chronic narcotic use admitted with cough, chest pain dyspnea.  Developed ARDS either from aspiration pneumonitis or rhinovirus.  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), ADHD (12/02/2009), Allergic rhinitis, cause unspecified (01/21/2011), ANXIETY (03/24/2009), B12 DEFICIENCY (04/28/2009), Bronchitis (12/2017), BURSITIS, RIGHT KNEE (02/05/2010), Cellulitis and abscess of leg, except foot (02/05/2010), Cervicalgia (12/02/2009), COMMON MIGRAINE (02/05/2010), CROHN'S DISEASE-SMALL INTESTINE (05/19/2009), ECZEMA (05/20/2010), Endometriosis (08/04/2011), Fibromyalgia, GERD (03/24/2009), HEADACHE, CHRONIC (03/24/2009), HYPERTENSION (03/24/2009), Osteoarthritis, OTITIS MEDIA, LEFT (08/12/2010), SMOKER (12/02/2009), Spine pain (01/21/2011), VITAMIN B1 DEFICIENCY (09/21/2009), and Wheezing (08/12/2010).  REVIEW OF SYSTEMS:   Cannot obtain as patient intubated  SUBJECTIVE:  Fever Diuresed well WBC down Minimal stool Multiple attempts to stop nimbex in last 24 hours complicated by severe vent dyssynchrony  VITAL SIGNS: BP 140/84 Comment: Simultaneous filing. User may not have seen previous data.  Pulse (!) 115   Temp (!) 102.4 F (39.1 C) (Oral)   Resp (!) 30   Ht 5' 2"  (1.575 m)   Wt 154 lb 5.2 oz (70 kg)   LMP 12/03/2017 (Exact Date)   SpO2 91%   BMI 28.23 kg/m   HEMODYNAMICS:    VENTILATOR SETTINGS: Vent Mode: PRVC FiO2 (%):  [50 %-70 %] 50 % Set Rate:  [30 bmp] 30 bmp Vt Set:  [400 mL] 400 mL PEEP:  [10 cmH20-14 cmH20] 10 cmH20 Plateau Pressure:  [21 cmH20-30 cmH20] 25 cmH20  INTAKE / OUTPUT: I/O last 3 completed shifts: In: 5807.2  [I.V.:1982.2; Other:20; NG/GT:2705; IV Piggyback:1100] Out: 3716 [Urine:5225]  PHYSICAL EXAMINATION:  General:  In bed on vent HENT: NCAT ETT in place PULM: CTA B, crackles/wheezes CV: RRR, no mgr GI: BS+, soft, nontender MSK: diminished bulk and tone;  Neuro: sedated, chemically paralyzed on vent   LABS:  BMET Recent Labs  Lab 12/09/17 0412 12/09/17 2042 12/10/17 0414  NA 150* 148* 149*  K 3.0* 3.6 3.6  CL 119* 114* 113*  CO2 23 26 28   BUN 13 18 17   CREATININE 1.32* 1.46* 1.33*  GLUCOSE 116* 158* 109*    Electrolytes Recent Labs  Lab 12/08/17 1700 12/09/17 0412 12/09/17 1740 12/09/17 2042 12/10/17 0414  CALCIUM 6.6* 6.2*  --  6.3* 6.9*  MG 2.1 2.1 2.1  --   --   PHOS 4.5 3.5 3.7  --   --     CBC Recent Labs  Lab 12/08/17 0747 12/09/17 0412 12/10/17 0414  WBC 54.0* 31.7* 24.9*  HGB 10.8* 8.3* 8.1*  HCT 34.1* 25.9* 26.1*  PLT 523* 390 381    Coag's Recent Labs  Lab 12/08/17 0747  INR 1.35    Sepsis Markers Recent Labs  Lab 12/07/17 0445  12/07/17 1218 12/08/17 0747 12/08/17 0748 12/08/17 0952  LATICACIDVEN  --    < > 1.6  --  1.1 0.6  PROCALCITON 88.50  --   --  32.13  --   --    < > = values in this interval not displayed.    ABG Recent Labs  Lab 12/08/17 2131 12/08/17 2359 12/09/17 0416  PHART 7.377 7.403 7.381  PCO2ART 35.9 36.8 34.9  PO2ART 92.0 92.0 94.0  Liver Enzymes Recent Labs  Lab 12/06/17 1957 12/07/17 1047 12/08/17 0747  AST 14* 20 23  ALT 8* QUANTITY NOT SUFFICIENT, UNABLE TO PERFORM TEST 12*  ALKPHOS 155* 168* 218*  BILITOT 1.0 QUANTITY NOT SUFFICIENT, UNABLE TO PERFORM TEST 0.4  ALBUMIN 2.2* 2.4* 1.8*    Cardiac Enzymes Recent Labs  Lab 12/08/17 0747 12/08/17 1106  TROPONINI 0.31* <0.03    Glucose Recent Labs  Lab 12/09/17 1139 12/09/17 1609 12/09/17 1935 12/09/17 2341 12/10/17 0418 12/10/17 0748  GLUCAP 128* 155* 123* 127* 104* 118*    Imaging Dg Chest Port 1 View  Result Date:  12/09/2017 CLINICAL DATA:  ET tube placement. EXAM: PORTABLE CHEST 1 VIEW COMPARISON:  Chest x-ray from same day at 5 a.m. FINDINGS: Interval advancement of the endotracheal tube with the tip now 4.3 cm above the carina. Unchanged left internal jugular central venous catheter and enteric tube. Stable obscuration of the cardiac silhouette. Diffuse, lower lobe predominant airspace disease bilaterally, worse on the left, is similar to slightly improved. No pneumothorax. No acute osseous abnormality. IMPRESSION: 1. Diffuse bilateral airspace disease, worse on the left, is stable to slightly improved, particularly in the left upper lobe. Electronically Signed   By: Titus Dubin M.D.   On: 12/09/2017 09:38     STUDIES:  5/2 ct abd> no acute process, bowel looks OK, mild gallbladder distention 5/2 CT sinus> generalized acute sinusitis with fluid levels, possible otomastoiditis 5/3 echo > LVEF 65-70, PA pressure 42 mm Hg  CULTURES: 5/1 blood >  5/3 blood >  5/3 resp >  5/2 resp viral panel > rhinovirus 5/2 rapid strep throat swab > negative  ANTIBIOTICS: 5/1 ceftriaxone > 5/2 5/2 azithro > 5/2 5/3 vanc > 5/4 5/3 Mero > 5/4  5/4 ceftriaxone >   SIGNIFICANT EVENTS: 5/3 move to ICU, intubated, paralyzed, prone  LINES/TUBES: 5/3 ETT >  5/3 L IJ CVL >  5/3 R radial arterial line >   DISCUSSION: 37 y/o female with crohns, etoh abuse, chronic narcotic use (NO IVDU, chart incorrect) who has ARDS from either rhinovirus or aspiration pneumonitis.   ASSESSMENT / PLAN:  PULMONARY A: ARDS > improving but still has severe acute lung disease requiring high vent suppot 5/5 Severe vent dissynchrony P:   Stop nimbex again today Keep deeply sedated Continue to wean PEEP/FiO2 per ARDS protocol, goal SaO2 > 88%, paO2 target 55-65 Diurese aiming for CVP < 4 No wake up assessment today CXR portable in AM  CARDIOVASCULAR A:  Mild sinus tachycardia per fever P:  Tele Monitor hemodynamics Ice  packs  RENAL A:   Hypokalemia and hypocalcemia Hypernatremia P:   Monitor BMET and UOP Replace electrolytes as needed Continue free water per feeding tube  GASTROINTESTINAL A:   Crohns, history of abdominal surgery but per mother not on any chronic medications for this Chronic diarrhea, now with no bowel movements P:   Continue tube feeding Continue pepcid Check KUB as typically has diarrhea  HEMATOLOGIC A:   Anemia of chronic disease, no bleeding P:  Monitor for bleeding  Transfuse if Hgb < 7gm/dL  INFECTIOUS A:   Severe CAP > from rhinovirus Aspiration pneumonitis possible Sinusitis/possible mastoiditis per CT scan Fever 5/5 due to the above, overall clinically improving despite this so doubt new infection P:   Continue ceftriaxone Monitor cultures   ENDOCRINE A:   Mild Hyperglycemia P:   SSI  NEUROLOGIC A:   Need for heavy sedation in setting of severe ARDS Narcotic dependence >  does not use IV drugs EtOH use Vent dyssynchrony when weaning Nimbex P:   RASS goal -5 today Stop nimbex, add propofol for vent dyssynchrony Thiamine/folate  FAMILY  - Updates: mother updated 5/3  - Inter-disciplinary family meet or Palliative Care meeting due by:  day 7  My cc time 32 minutes  Roselie Awkward, MD [place] [facility] Pager: [phone] Cell: [phone] After 3pm or if no response, call [phone]   12/10/2017, 7:54 AM

## 2017-12-10 NOTE — Progress Notes (Signed)
RN notified of pt temp of 100.9 f orally.

## 2017-12-11 ENCOUNTER — Inpatient Hospital Stay (HOSPITAL_COMMUNITY): Payer: Medicaid Other

## 2017-12-11 DIAGNOSIS — E876 Hypokalemia: Secondary | ICD-10-CM

## 2017-12-11 DIAGNOSIS — J9601 Acute respiratory failure with hypoxia: Secondary | ICD-10-CM

## 2017-12-11 DIAGNOSIS — E8770 Fluid overload, unspecified: Secondary | ICD-10-CM

## 2017-12-11 LAB — POCT I-STAT 3, ART BLOOD GAS (G3+)
Acid-Base Excess: 15 mmol/L — ABNORMAL HIGH (ref 0.0–2.0)
Bicarbonate: 39.2 mmol/L — ABNORMAL HIGH (ref 20.0–28.0)
O2 Saturation: 96 %
Patient temperature: 99.8
TCO2: 41 mmol/L — ABNORMAL HIGH (ref 22–32)
pCO2 arterial: 48.3 mmHg — ABNORMAL HIGH (ref 32.0–48.0)
pH, Arterial: 7.52 — ABNORMAL HIGH (ref 7.350–7.450)
pO2, Arterial: 78 mmHg — ABNORMAL LOW (ref 83.0–108.0)

## 2017-12-11 LAB — CBC WITH DIFFERENTIAL/PLATELET
Basophils Absolute: 0.2 10*3/uL — ABNORMAL HIGH (ref 0.0–0.1)
Basophils Relative: 1 %
Eosinophils Absolute: 0.2 10*3/uL (ref 0.0–0.7)
Eosinophils Relative: 1 %
HCT: 27.5 % — ABNORMAL LOW (ref 36.0–46.0)
Hemoglobin: 8.5 g/dL — ABNORMAL LOW (ref 12.0–15.0)
Lymphocytes Relative: 16 %
Lymphs Abs: 3.6 10*3/uL (ref 0.7–4.0)
MCH: 24.6 pg — ABNORMAL LOW (ref 26.0–34.0)
MCHC: 30.9 g/dL (ref 30.0–36.0)
MCV: 79.5 fL (ref 78.0–100.0)
Monocytes Absolute: 0.9 10*3/uL (ref 0.1–1.0)
Monocytes Relative: 4 %
Neutro Abs: 17.3 10*3/uL — ABNORMAL HIGH (ref 1.7–7.7)
Neutrophils Relative %: 78 %
Platelets: 356 10*3/uL (ref 150–400)
RBC: 3.46 MIL/uL — ABNORMAL LOW (ref 3.87–5.11)
RDW: 18.3 % — ABNORMAL HIGH (ref 11.5–15.5)
WBC: 22.2 10*3/uL — ABNORMAL HIGH (ref 4.0–10.5)

## 2017-12-11 LAB — GLUCOSE, CAPILLARY
Glucose-Capillary: 132 mg/dL — ABNORMAL HIGH (ref 65–99)
Glucose-Capillary: 106 mg/dL — ABNORMAL HIGH (ref 65–99)
Glucose-Capillary: 150 mg/dL — ABNORMAL HIGH (ref 65–99)
Glucose-Capillary: 135 mg/dL — ABNORMAL HIGH (ref 65–99)
Glucose-Capillary: 117 mg/dL — ABNORMAL HIGH (ref 65–99)
Glucose-Capillary: 132 mg/dL — ABNORMAL HIGH (ref 65–99)

## 2017-12-11 LAB — BASIC METABOLIC PANEL
Anion gap: 8 (ref 5–15)
Anion gap: 8 (ref 5–15)
BUN: 18 mg/dL (ref 6–20)
BUN: 15 mg/dL (ref 6–20)
CO2: 35 mmol/L — ABNORMAL HIGH (ref 22–32)
CO2: 35 mmol/L — ABNORMAL HIGH (ref 22–32)
Calcium: 6.3 mg/dL — CL (ref 8.9–10.3)
Calcium: 6.5 mg/dL — ABNORMAL LOW (ref 8.9–10.3)
Chloride: 103 mmol/L (ref 101–111)
Chloride: 103 mmol/L (ref 101–111)
Creatinine, Ser: 0.97 mg/dL (ref 0.44–1.00)
Creatinine, Ser: 0.93 mg/dL (ref 0.44–1.00)
GFR calc Af Amer: >60 mL/min (ref >60)
GFR calc Af Amer: >60 mL/min (ref >60)
GFR calc non Af Amer: >60 mL/min (ref >60)
GFR calc non Af Amer: >60 mL/min (ref >60)
Glucose, Bld: 131 mg/dL — ABNORMAL HIGH (ref 65–99)
Glucose, Bld: 131 mg/dL — ABNORMAL HIGH (ref 65–99)
Potassium: 2.6 mmol/L — CL (ref 3.5–5.1)
Potassium: 3.8 mmol/L (ref 3.5–5.1)
Sodium: 146 mmol/L — ABNORMAL HIGH (ref 135–145)
Sodium: 146 mmol/L — ABNORMAL HIGH (ref 135–145)

## 2017-12-11 IMAGING — DX DG CHEST 1V PORT
1 series · 1 of 1 positions shown · non-contrast
Comparison: [DATE]

CLINICAL DATA: Acute respiratory failure

EXAM:
PORTABLE CHEST 1 VIEW

[chest ap]
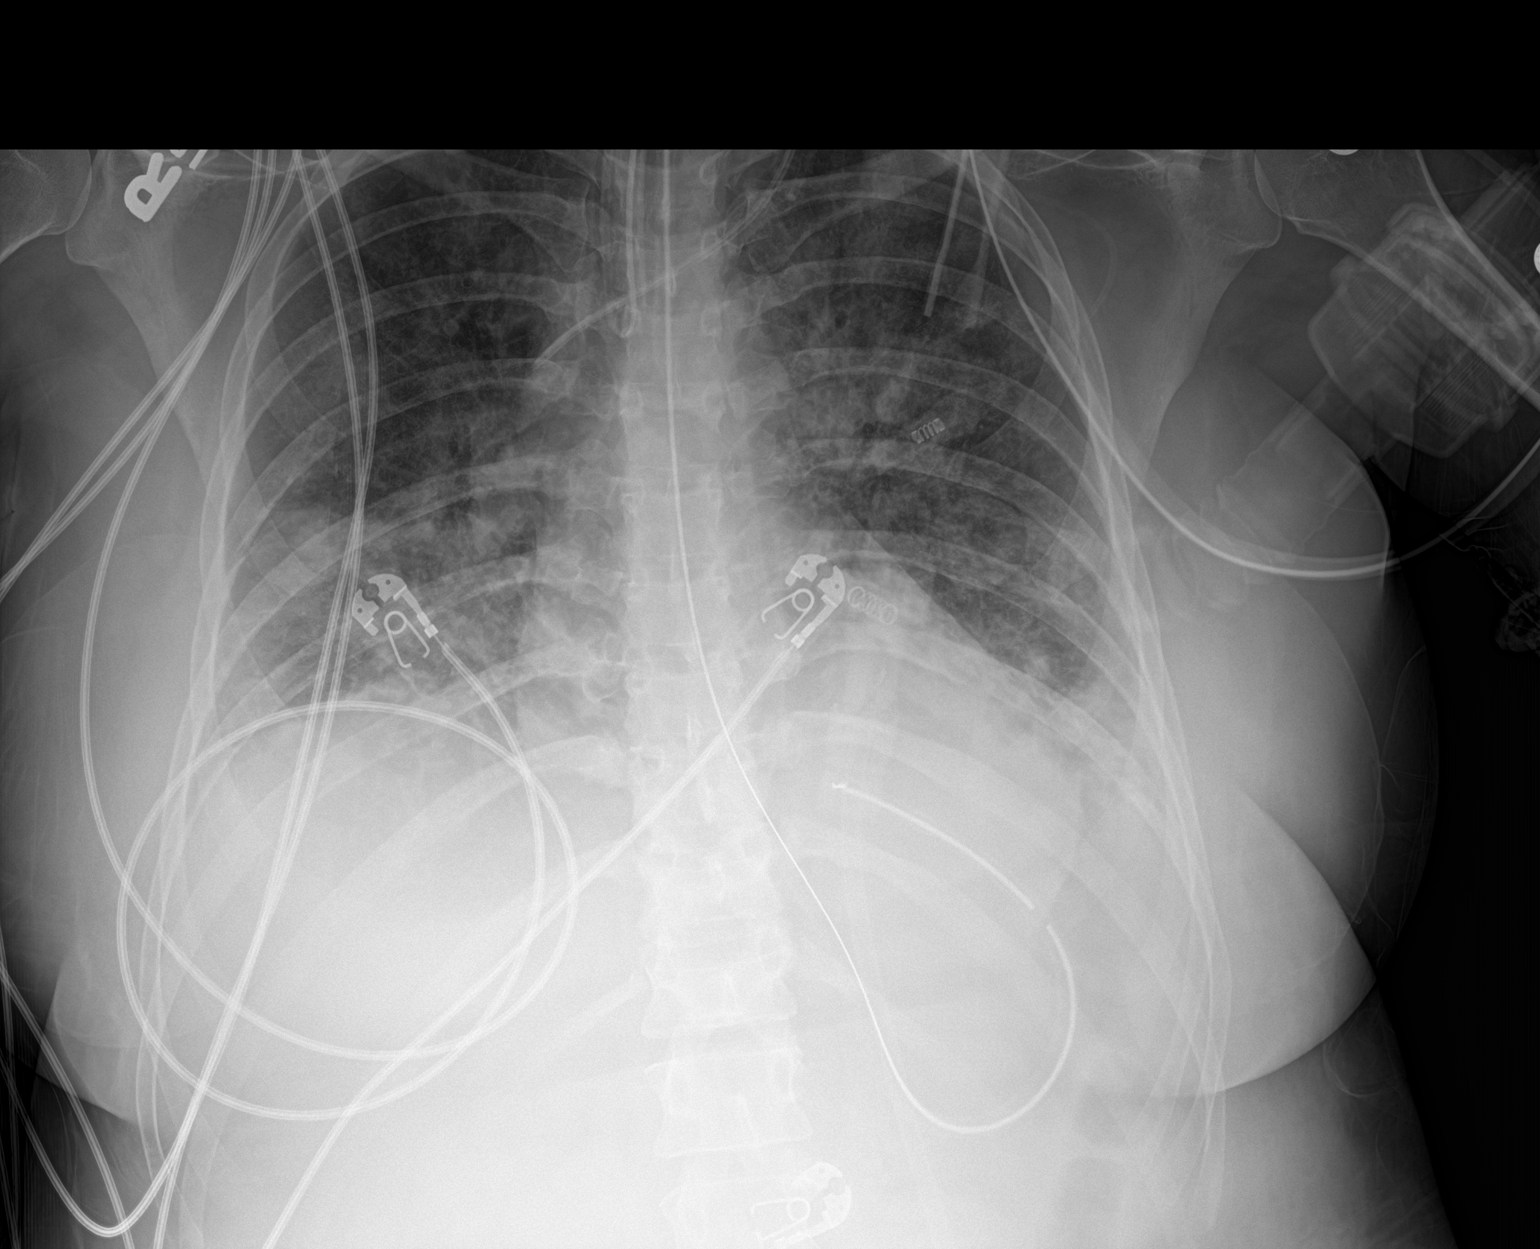

[1 of 1 positions shown; findings below may reference images not displayed]

FINDINGS: Cardiac shadow is stable. Endotracheal tube, nasogastric catheter
and left jugular central line are again seen and stable. Patchy
infiltrates are noted bilaterally relatively stable from the
previous exam. No bony abnormality is noted.
IMPRESSION: No change from the prior exam

## 2017-12-11 MED ORDER — NOREPINEPHRINE BITARTRATE 1 MG/ML IV SOLN
0.0000 ug/min | INTRAVENOUS | Status: DC
  Administered 2017-12-11: 2 ug/min via INTRAVENOUS
  Filled 2017-12-11 (×2): qty 16

## 2017-12-11 MED ORDER — VITAMIN B-1 100 MG PO TABS
100.0000 mg | ORAL_TABLET | Freq: Every day | ORAL | Status: DC
  Administered 2017-12-11 – 2017-12-14 (×4): 100 mg
  Filled 2017-12-11 (×7): qty 1

## 2017-12-11 MED ORDER — FUROSEMIDE 10 MG/ML IJ SOLN
40.0000 mg | Freq: Four times a day (QID) | INTRAMUSCULAR | Status: AC
  Administered 2017-12-11 (×2): 40 mg via INTRAVENOUS
  Filled 2017-12-11 (×3): qty 4

## 2017-12-11 MED ORDER — NOREPINEPHRINE BITARTRATE 1 MG/ML IV SOLN: 0.0000 ug/min | INTRAVENOUS | Status: DC

## 2017-12-11 MED ORDER — SODIUM CHLORIDE 0.9 % IV BOLUS
500.0000 mL | Freq: Once | INTRAVENOUS | Status: AC
  Administered 2017-12-11: 500 mL via INTRAVENOUS

## 2017-12-11 MED ORDER — FAMOTIDINE IN NACL 20-0.9 MG/50ML-% IV SOLN
20.0000 mg | Freq: Two times a day (BID) | INTRAVENOUS | Status: DC
  Administered 2017-12-11 – 2017-12-12 (×3): 20 mg via INTRAVENOUS
  Filled 2017-12-11 (×4): qty 50

## 2017-12-11 MED ORDER — POTASSIUM CHLORIDE 20 MEQ/15ML (10%) PO SOLN
40.0000 meq | Freq: Three times a day (TID) | ORAL | Status: AC
  Administered 2017-12-11 (×2): 40 meq
  Filled 2017-12-11 (×2): qty 30

## 2017-12-11 MED ORDER — DEXMEDETOMIDINE HCL IN NACL 400 MCG/100ML IV SOLN
0.4000 ug/kg/h | INTRAVENOUS | Status: DC
Start: 2017-12-11 — End: 2017-12-16
  Administered 2017-12-11: 0.5 ug/kg/h via INTRAVENOUS
  Administered 2017-12-11: 1 ug/kg/h via INTRAVENOUS
  Administered 2017-12-12: 1.5 ug/kg/h via INTRAVENOUS
  Administered 2017-12-12: 1 ug/kg/h via INTRAVENOUS
  Administered 2017-12-12: 1.5 ug/kg/h via INTRAVENOUS
  Administered 2017-12-12: 0.4 ug/kg/h via INTRAVENOUS
  Administered 2017-12-12: 1 ug/kg/h via INTRAVENOUS
  Administered 2017-12-13: 2.2 ug/kg/h via INTRAVENOUS
  Administered 2017-12-13: 1.4 ug/kg/h via INTRAVENOUS
  Administered 2017-12-13 (×3): 1.5 ug/kg/h via INTRAVENOUS
  Administered 2017-12-13: 1.7 ug/kg/h via INTRAVENOUS
  Administered 2017-12-13 – 2017-12-14 (×2): 1.5 ug/kg/h via INTRAVENOUS
  Administered 2017-12-14: 1.7 ug/kg/h via INTRAVENOUS
  Administered 2017-12-14: 1.5 ug/kg/h via INTRAVENOUS
  Administered 2017-12-15 (×2): 0.4 ug/kg/h via INTRAVENOUS
  Administered 2017-12-16: 0.6 ug/kg/h via INTRAVENOUS
  Filled 2017-12-11: qty 200
  Filled 2017-12-11 (×17): qty 100

## 2017-12-11 MED ORDER — DOCUSATE SODIUM 50 MG/5ML PO LIQD
100.0000 mg | Freq: Two times a day (BID) | ORAL | Status: DC
  Administered 2017-12-11 – 2017-12-14 (×7): 100 mg
  Filled 2017-12-11 (×7): qty 10

## 2017-12-11 MED ORDER — POTASSIUM CHLORIDE 10 MEQ/50ML IV SOLN
10.0000 meq | INTRAVENOUS | Status: AC
  Administered 2017-12-11 (×8): 10 meq via INTRAVENOUS
  Filled 2017-12-11 (×8): qty 50

## 2017-12-11 MED ORDER — MAGNESIUM HYDROXIDE 400 MG/5ML PO SUSP
15.0000 mL | Freq: Every day | ORAL | Status: DC
  Administered 2017-12-11: 15 mL
  Filled 2017-12-11 (×2): qty 30

## 2017-12-11 MED ORDER — DEXMEDETOMIDINE HCL IN NACL 200 MCG/50ML IV SOLN
0.4000 ug/kg/h | INTRAVENOUS | Status: DC
  Filled 2017-12-11: qty 50

## 2017-12-11 NOTE — Progress Notes (Signed)
[facility] ADULT ICU REPLACEMENT PROTOCOL FOR AM LAB REPLACEMENT ONLY  The patient does apply for the [facility] Adult ICU Electrolyte Replacment Protocol based on the criteria listed below:   1. Is GFR >/= 40 ml/min? Yes.    Patient's GFR today is >60 2. Is urine output >/= 0.5 ml/kg/hr for the last 6 hours? Yes.   Patient's UOP is 0.9 ml/kg/hr 3. Is BUN < 60 mg/dL? Yes.    Patient's BUN today is 18 4. Abnormal electrolyte(s): K 2.6 5. Ordered repletion with: protocol 6. If a panic level lab has been reported, has the CCM MD in charge been notified? No..   Physician:    Ronda Fairly A 12/11/2017 6:22 AM

## 2017-12-11 NOTE — Progress Notes (Signed)
RR decreased to 24 per MD per ABG results. Vitals are stable. RT will continue to monitor.

## 2017-12-11 NOTE — Progress Notes (Signed)
PULMONARY / CRITICAL CARE MEDICINE   Name: [name] MRN: [id number] DOB: May 17, 1981    ADMISSION DATE:  12/06/2017 CONSULTATION DATE:  12/08/17  REFERRING MD:  Lonny Prude  CHIEF COMPLAINT:  Dyspnea  BRIEF HISTORY OF PRESENT ILLNESS:   37 y/o female with crohn's, EtOH abuse, chronic narcotic use admitted with cough, chest pain dyspnea.  Developed ARDS either from aspiration pneumonitis or rhinovirus.  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), ADHD (12/02/2009), Allergic rhinitis, cause unspecified (01/21/2011), ANXIETY (03/24/2009), B12 DEFICIENCY (04/28/2009), Bronchitis (12/2017), BURSITIS, RIGHT KNEE (02/05/2010), Cellulitis and abscess of leg, except foot (02/05/2010), Cervicalgia (12/02/2009), COMMON MIGRAINE (02/05/2010), CROHN'S DISEASE-SMALL INTESTINE (05/19/2009), ECZEMA (05/20/2010), Endometriosis (08/04/2011), Fibromyalgia, GERD (03/24/2009), HEADACHE, CHRONIC (03/24/2009), HYPERTENSION (03/24/2009), Osteoarthritis, OTITIS MEDIA, LEFT (08/12/2010), SMOKER (12/02/2009), Spine pain (01/21/2011), VITAMIN B1 DEFICIENCY (09/21/2009), and Wheezing (08/12/2010).  REVIEW OF SYSTEMS:   Cannot obtain as patient intubated  SUBJECTIVE:  Diuresed well  No events Very dyssynchronous with the vent overnight  VITAL SIGNS: BP 96/67 (BP Location: Left Arm)   Pulse 93   Temp 99.8 F (37.7 C) (Oral)   Resp (!) 30   Ht 5' 2"  (1.575 m)   Wt 158 lb 15.2 oz (72.1 kg)   LMP 12/03/2017 (Exact Date)   SpO2 95%   BMI 29.07 kg/m   HEMODYNAMICS:    VENTILATOR SETTINGS: Vent Mode: PRVC FiO2 (%):  [40 %-50 %] 50 % Set Rate:  [30 bmp] 30 bmp Vt Set:  [400 mL] 400 mL PEEP:  [8 cmH20-10 cmH20] 8 cmH20 Plateau Pressure:  [24 cmH20-30 cmH20] 26 cmH20  INTAKE / OUTPUT: I/O last 3 completed shifts: In: 5061.6 [I.V.:1371.6; NG/GT:3070; IV Piggyback:620] Out: 8005 [Urine:8005]  PHYSICAL EXAMINATION:  General:  Sedated and  paralyzed on the vent, much older than stated age HENT: [place]/AT, pupils sluggish, no EOM and MMM PULM: Diffuse crackles bialterally CV: RRR, Nl S1/S2 and -M/R/G GI: Soft, NT, ND and +BS MSK: diminished bulk and tone;  Neuro: sedated, chemically paralyzed on vent  LABS:  BMET Recent Labs  Lab 12/09/17 2042 12/10/17 0414 12/11/17 0350  NA 148* 149* 146*  K 3.6 3.6 2.6*  CL 114* 113* 103  CO2 26 28 35*  BUN 18 17 18   CREATININE 1.46* 1.33* 0.97  GLUCOSE 158* 109* 131*   Electrolytes Recent Labs  Lab 12/08/17 1700 12/09/17 0412 12/09/17 1740 12/09/17 2042 12/10/17 0414 12/11/17 0350  CALCIUM 6.6* 6.2*  --  6.3* 6.9* 6.3*  MG 2.1 2.1 2.1  --   --   --   PHOS 4.5 3.5 3.7  --   --   --    CBC Recent Labs  Lab 12/09/17 0412 12/10/17 0414 12/11/17 0350  WBC 31.7* 24.9* 22.2*  HGB 8.3* 8.1* 8.5*  HCT 25.9* 26.1* 27.5*  PLT 390 381 356   Coag's Recent Labs  Lab 12/08/17 0747  INR 1.35   Sepsis Markers Recent Labs  Lab 12/07/17 0445  12/07/17 1218 12/08/17 0747 12/08/17 0748 12/08/17 0952  LATICACIDVEN  --    < > 1.6  --  1.1 0.6  PROCALCITON 88.50  --   --  32.13  --   --    < > = values in this interval not displayed.   ABG Recent Labs  Lab 12/08/17 2131 12/08/17 2359 12/09/17 0416  PHART 7.377 7.403 7.381  PCO2ART 35.9 36.8 34.9  PO2ART 92.0 92.0 94.0   Liver  Enzymes Recent Labs  Lab 12/06/17 1957 12/07/17 1047 12/08/17 0747  AST 14* 20 23  ALT 8* QUANTITY NOT SUFFICIENT, UNABLE TO PERFORM TEST 12*  ALKPHOS 155* 168* 218*  BILITOT 1.0 QUANTITY NOT SUFFICIENT, UNABLE TO PERFORM TEST 0.4  ALBUMIN 2.2* 2.4* 1.8*   Cardiac Enzymes Recent Labs  Lab 12/08/17 0747 12/08/17 1106  TROPONINI 0.31* <0.03   Glucose Recent Labs  Lab 12/10/17 1218 12/10/17 1524 12/10/17 1929 12/10/17 2311 12/11/17 0336 12/11/17 0718  GLUCAP 123* 135* 105* 118* 132* 106*   Imaging Dg Chest Port 1 View  Result Date: 12/11/2017 CLINICAL DATA:  Acute  respiratory failure EXAM: PORTABLE CHEST 1 VIEW COMPARISON:  12/10/2017 FINDINGS: Cardiac shadow is stable. Endotracheal tube, nasogastric catheter and left jugular central line are again seen and stable. Patchy infiltrates are noted bilaterally relatively stable from the previous exam. No bony abnormality is noted. IMPRESSION: No change from the prior exam Electronically Signed   By: Inez Catalina M.D.   On: 12/11/2017 07:49   Dg Abd Portable 1v  Result Date: 12/10/2017 CLINICAL DATA:  37 year old female with history of constipation. EXAM: PORTABLE ABDOMEN - 1 VIEW COMPARISON:  No priors. FINDINGS: Nasogastric tube in position with tip in the proximal stomach. Single view of the abdomen demonstrates some gas and stool throughout the colon and rectum. No pathologic dilatation of small bowel or colon. No gross pneumoperitoneum on this single supine view. Suture line noted in the right lower quadrant. Overall, stool burden does not appear excessive. IMPRESSION: 1. Nonobstructive bowel gas pattern. 2. No pneumoperitoneum. 3. Stool burden does not appear excessive despite the reported history of constipation. Electronically Signed   By: Vinnie Langton M.D.   On: 12/10/2017 10:27   STUDIES:  5/2 ct abd> no acute process, bowel looks OK, mild gallbladder distention 5/2 CT sinus> generalized acute sinusitis with fluid levels, possible otomastoiditis 5/3 echo > LVEF 65-70, PA pressure 42 mm Hg  CULTURES: 5/1 blood >  5/3 blood >  5/3 resp >  5/2 resp viral panel > rhinovirus 5/2 rapid strep throat swab > negative  ANTIBIOTICS: 5/1 ceftriaxone > 5/2 5/2 azithro > 5/2 5/3 vanc > 5/4 5/3 Mero > 5/4  5/4 ceftriaxone >   SIGNIFICANT EVENTS: 5/3 move to ICU, intubated, paralyzed, prone  LINES/TUBES: 5/3 ETT >  5/3 L IJ CVL >  5/3 R radial arterial line >   DISCUSSION: 37 y/o female with crohns, etoh abuse, chronic narcotic use (NO IVDU, chart incorrect) who has ARDS from either rhinovirus or  aspiration pneumonitis.  Oxygenation improving slowly  ASSESSMENT / PLAN:  PULMONARY A: ARDS > improving but still has severe acute lung disease requiring high vent suppot 5/5 Severe vent dissynchrony P:   Attempt to stop nimbex again today given that she is negative 3.5 liters overnight but will not remove from [facility] just yet Keep deeply sedated Add precedex Continue to wean PEEP/FiO2 per ARDS protocol, goal SaO2 > 88%, paO2 target 55-65 ABG now Diurese aiming for CVP < 4 No wake up assessment today CXR portable in AM  CARDIOVASCULAR A:  Mild sinus tachycardia per fever P:  Tele Monitor hemodynamics Ice packs Maintain a-line in place  RENAL A:   Hypokalemia and hypocalcemia Hypernatremia P:   Monitor BMET and UOP Replace electrolytes as needed Continue free water per feeding tube Free water 200 q4 Lasix 40 mg IV q6 x3 doses  GASTROINTESTINAL A:   Crohns, history of abdominal surgery but per mother not on  any chronic medications for this Chronic diarrhea, now with no bowel movements P:   Continue tube feeding Continue pepcid D/C protonix MOM and Colace ordered  HEMATOLOGIC A:   Anemia of chronic disease, no bleeding P:  Monitor for bleeding  Transfuse if Hgb < 7gm/dL  INFECTIOUS A:   Severe CAP > from rhinovirus Aspiration pneumonitis possible Sinusitis/possible mastoiditis per CT scan Fever 5/5 due to the above, overall clinically improving despite this so doubt new infection P:   Continue ceftriaxone Monitor cultures  ENDOCRINE A:   Mild Hyperglycemia P:   SSI  NEUROLOGIC A:   Need for heavy sedation in setting of severe ARDS Narcotic dependence > does not use IV drugs EtOH use Vent dyssynchrony when weaning Nimbex P:   RASS goal -5 today Attempt to stop nimbex today Precedex drip as ordered Thiamine/folate  FAMILY  - Updates: No family bedside 5/6.  - Inter-disciplinary family meet or Palliative Care meeting due by:  day 7  The  patient is critically ill with multiple organ systems failure and requires high complexity decision making for assessment and support, frequent evaluation and titration of therapies, application of advanced monitoring technologies and extensive interpretation of multiple databases.   Critical Care Time devoted to patient care services described in this note is  32  Minutes. This time reflects time of care of this signee Dr Jennet Maduro. This critical care time does not reflect procedure time, or teaching time or supervisory time of PA/NP/Med student/Med Resident etc but could involve care discussion time.  Rush Farmer, M.D. [facility] Pulmonary/Critical Care Medicine. Pager: [phone]. After hours pager: [phone].  12/11/2017, 8:30 AM

## 2017-12-11 NOTE — Progress Notes (Signed)
CRITICAL VALUE ALERT  Critical Value:  Potassium and Calcium   Date & Time Notied: 12/11/17 5:24   Provider Notified: Warren Lacy RN  Orders Received/Actions taken:   - Will notify MD about results. An order will be put in to replace electrolytes.

## 2017-12-12 ENCOUNTER — Inpatient Hospital Stay (HOSPITAL_COMMUNITY): Payer: Medicaid Other

## 2017-12-12 LAB — BASIC METABOLIC PANEL
Anion gap: 11 (ref 5–15)
BUN: 14 mg/dL (ref 6–20)
CO2: 35 mmol/L — ABNORMAL HIGH (ref 22–32)
Calcium: 7.2 mg/dL — ABNORMAL LOW (ref 8.9–10.3)
Chloride: 101 mmol/L (ref 101–111)
Creatinine, Ser: 0.97 mg/dL (ref 0.44–1.00)
GFR calc Af Amer: >60 mL/min (ref >60)
GFR calc non Af Amer: >60 mL/min (ref >60)
Glucose, Bld: 151 mg/dL — ABNORMAL HIGH (ref 65–99)
Potassium: 3.7 mmol/L (ref 3.5–5.1)
Sodium: 147 mmol/L — ABNORMAL HIGH (ref 135–145)

## 2017-12-12 LAB — URINALYSIS, ROUTINE W REFLEX MICROSCOPIC
Bacteria, UA: NONE SEEN
Bilirubin Urine: NEGATIVE
Glucose, UA: NEGATIVE mg/dL
Ketones, ur: NEGATIVE mg/dL
Leukocytes, UA: NEGATIVE
Nitrite: NEGATIVE
Protein, ur: 100 mg/dL — AB
Specific Gravity, Urine: 1.015 (ref 1.005–1.030)
pH: 8 (ref 5.0–8.0)

## 2017-12-12 LAB — CBC
HCT: 31.7 % — ABNORMAL LOW (ref 36.0–46.0)
Hemoglobin: 9.5 g/dL — ABNORMAL LOW (ref 12.0–15.0)
MCH: 24.4 pg — ABNORMAL LOW (ref 26.0–34.0)
MCHC: 30 g/dL (ref 30.0–36.0)
MCV: 81.3 fL (ref 78.0–100.0)
Platelets: 454 10*3/uL — ABNORMAL HIGH (ref 150–400)
RBC: 3.9 MIL/uL (ref 3.87–5.11)
RDW: 18.5 % — ABNORMAL HIGH (ref 11.5–15.5)
WBC: 21.6 10*3/uL — ABNORMAL HIGH (ref 4.0–10.5)

## 2017-12-12 LAB — GLUCOSE, CAPILLARY
Glucose-Capillary: 127 mg/dL — ABNORMAL HIGH (ref 65–99)
Glucose-Capillary: 136 mg/dL — ABNORMAL HIGH (ref 65–99)
Glucose-Capillary: 139 mg/dL — ABNORMAL HIGH (ref 65–99)
Glucose-Capillary: 145 mg/dL — ABNORMAL HIGH (ref 65–99)
Glucose-Capillary: 162 mg/dL — ABNORMAL HIGH (ref 65–99)
Glucose-Capillary: 164 mg/dL — ABNORMAL HIGH (ref 65–99)

## 2017-12-12 LAB — MAGNESIUM: Magnesium: 2.1 mg/dL (ref 1.7–2.4)

## 2017-12-12 LAB — CULTURE, BLOOD (ROUTINE X 2)
Culture: NO GROWTH
Culture: NO GROWTH
Special Requests: ADEQUATE

## 2017-12-12 LAB — POCT I-STAT 3, ART BLOOD GAS (G3+)
Acid-Base Excess: 15 mmol/L — ABNORMAL HIGH (ref 0.0–2.0)
Bicarbonate: 40 mmol/L — ABNORMAL HIGH (ref 20.0–28.0)
O2 Saturation: 97 %
Patient temperature: 100.3
TCO2: 42 mmol/L — ABNORMAL HIGH (ref 22–32)
pCO2 arterial: 54.6 mmHg — ABNORMAL HIGH (ref 32.0–48.0)
pH, Arterial: 7.476 — ABNORMAL HIGH (ref 7.350–7.450)
pO2, Arterial: 95 mmHg (ref 83.0–108.0)

## 2017-12-12 LAB — EXPECTORATED SPUTUM ASSESSMENT W GRAM STAIN, RFLX TO RESP C

## 2017-12-12 LAB — PHOSPHORUS: Phosphorus: 2.6 mg/dL (ref 2.5–4.6)

## 2017-12-12 IMAGING — DX DG CHEST 1V PORT
1 series · 1 of 1 positions shown · non-contrast
Comparison: [DATE] chest x-ray.

CLINICAL DATA: 36-year-old female intubated.  Subsequent encounter.

EXAM:
PORTABLE CHEST 1 VIEW

[chest ap]
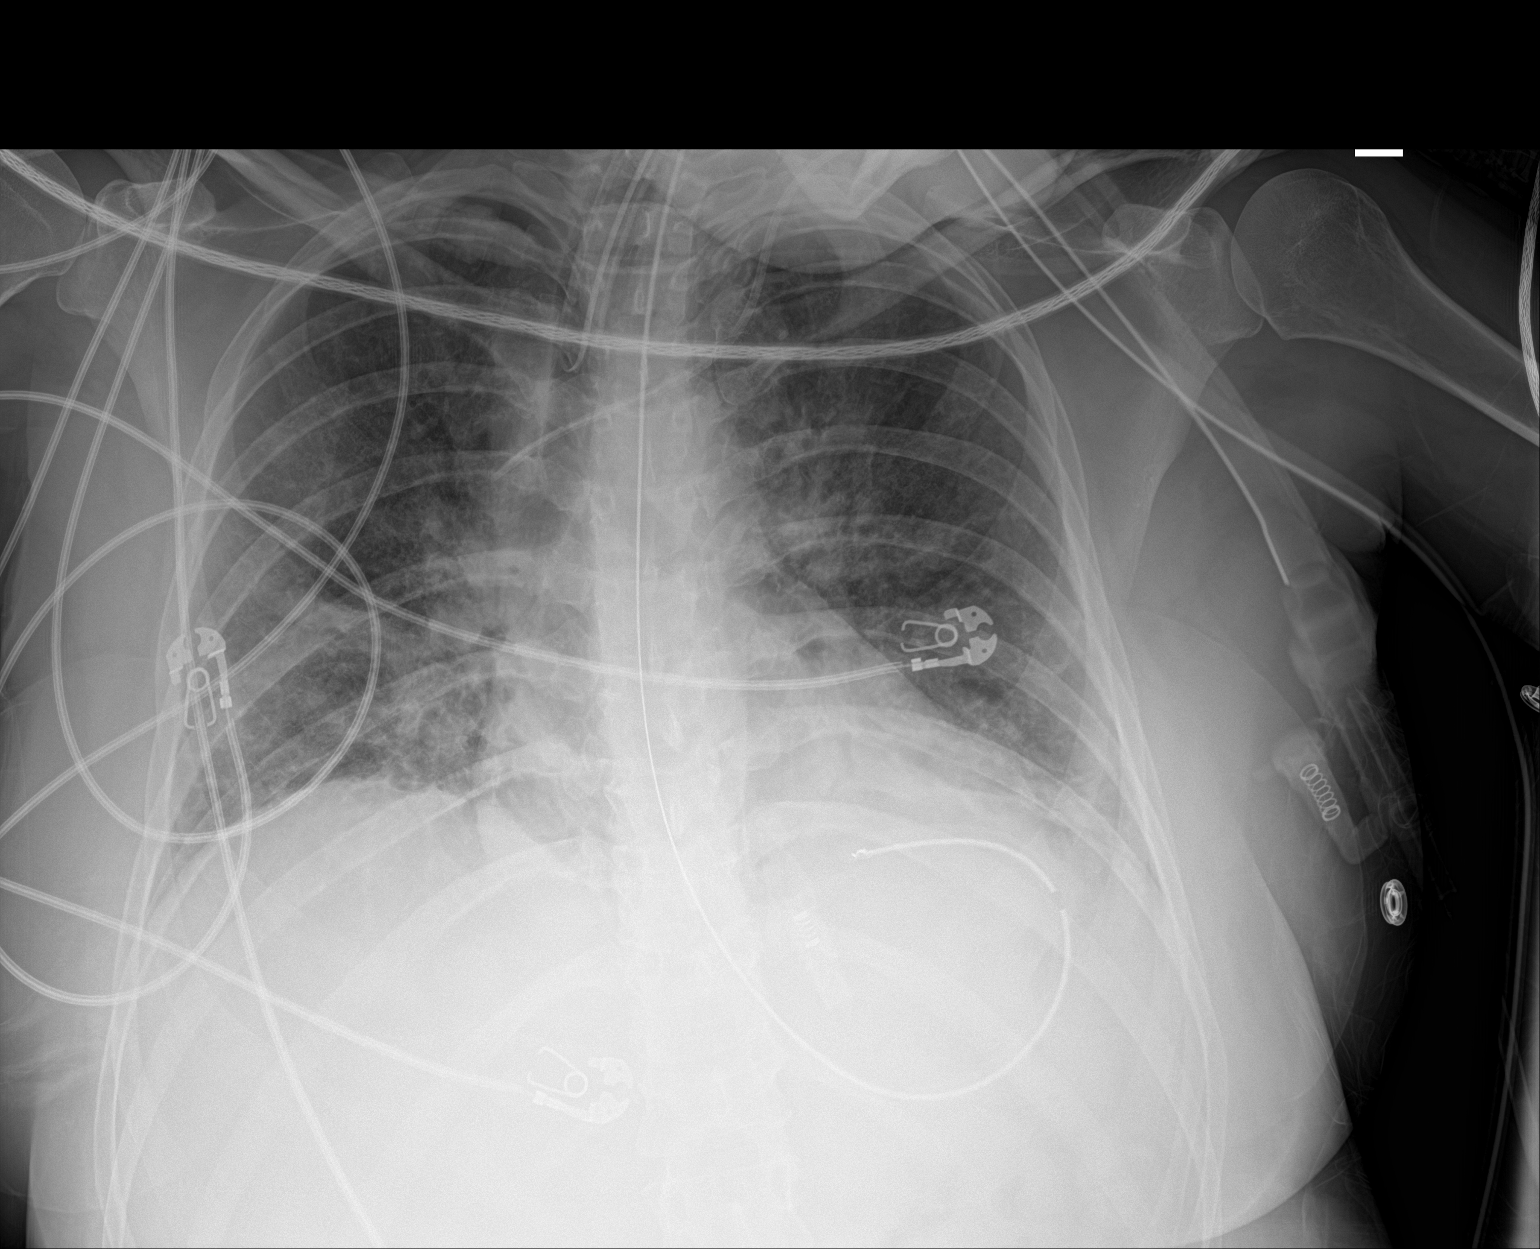

[1 of 1 positions shown; findings below may reference images not displayed]

FINDINGS: Endotracheal tube tip 3 cm above the carina.

Left central line tip proximal superior vena cava level and may
impress upon the lateral border of the superior vena cava.
Appearance unchanged.

Nasogastric tube tip and side hole curled within the gastric fundus.

Slightly asymmetric airspace disease similar to prior exam and may
represent pulmonary edema. Infectious process not excluded in proper
clinical setting. Left base subsegmental atelectasis.

No pneumothorax noted.

Heart size top-normal.
IMPRESSION: Endotracheal tube tip now 3 cm above the carina.

Left central line tip proximal superior vena cava level and may
impress upon the lateral border of the superior vena cava.
Appearance unchanged.

Similar appearance of asymmetric airspace disease possibly
representing pulmonary edema although infectious infiltrate not
excluded in the proper clinical setting.

## 2017-12-12 MED ORDER — FREE WATER
300.0000 mL | Freq: Four times a day (QID) | Status: DC
  Administered 2017-12-12 – 2017-12-14 (×9): 300 mL

## 2017-12-12 MED ORDER — VITAL AF 1.2 CAL PO LIQD
1000.0000 mL | ORAL | Status: DC
  Administered 2017-12-12 – 2017-12-13 (×2): 1000 mL

## 2017-12-12 MED ORDER — ACETAMINOPHEN 160 MG/5ML PO SOLN
650.0000 mg | Freq: Four times a day (QID) | ORAL | Status: DC | PRN
  Administered 2017-12-12 (×2): 650 mg via ORAL
  Filled 2017-12-12 (×2): qty 20.3

## 2017-12-12 MED ORDER — POTASSIUM CHLORIDE 20 MEQ/15ML (10%) PO SOLN
40.0000 meq | Freq: Three times a day (TID) | ORAL | Status: AC
Start: 2017-12-12 — End: 2017-12-12
  Administered 2017-12-12 (×2): 40 meq
  Filled 2017-12-12 (×2): qty 30

## 2017-12-12 MED ORDER — FUROSEMIDE 10 MG/ML IJ SOLN
40.0000 mg | Freq: Four times a day (QID) | INTRAMUSCULAR | Status: AC
Start: 2017-12-12 — End: 2017-12-12
  Administered 2017-12-12 (×3): 40 mg via INTRAVENOUS
  Filled 2017-12-12 (×3): qty 4

## 2017-12-12 MED ORDER — SODIUM CHLORIDE 0.9 % IV SOLN
1.0000 g | Freq: Two times a day (BID) | INTRAVENOUS | Status: DC
  Administered 2017-12-12 – 2017-12-14 (×5): 1 g via INTRAVENOUS
  Filled 2017-12-12 (×5): qty 1

## 2017-12-12 MED ORDER — VANCOMYCIN HCL IN DEXTROSE 750-5 MG/150ML-% IV SOLN
750.0000 mg | Freq: Two times a day (BID) | INTRAVENOUS | Status: DC
  Administered 2017-12-12 – 2017-12-14 (×3): 750 mg via INTRAVENOUS
  Filled 2017-12-12 (×4): qty 150

## 2017-12-12 MED ORDER — VANCOMYCIN HCL 10 G IV SOLR
1500.0000 mg | Freq: Once | INTRAVENOUS | Status: AC
  Administered 2017-12-12: 1500 mg via INTRAVENOUS
  Filled 2017-12-12: qty 1500

## 2017-12-12 NOTE — Care Management Note (Signed)
Case Management Note  Patient Details  Name: [name] MRN: [id number] Date of Birth: November 10, 1980  Subjective/Objective:    Pt admitted admitted with ARDS                Action/Plan:  PTA from home.  Pt is ventilated without family at bedside.  CM will continue to follow for discharge needs   Expected Discharge Date:                  Expected Discharge Plan:     In-House Referral:  Clinical Social Work  Discharge planning Services     Post Acute Care Choice:    Choice offered to:     DME Arranged:    DME Agency:     HH Arranged:    [place] Agency:     Status of Service:     If discussed at [employer], dates discussed:    Additional Comments:  Maryclare Labrador, RN 12/12/2017, 1:45 PM

## 2017-12-12 NOTE — Progress Notes (Signed)
40 versed IV wasted in sink by Silvio Clayman.

## 2017-12-12 NOTE — Progress Notes (Signed)
Pharmacy Antibiotic Note  [name] is a 36 y.o. female admitted on 12/06/2017 with rhinovirus who likely developed CAP / ARDS. Pt is still febrile, Tm/24h 102.9, WBC improving, down to 21. Planning to broaden antibiotics today due to patient still spiking fevers.  Azith 5/1>>5/3 CTX 5/1>>5/3; 5/4>>5/7 Merrem 5/3>>5/4; 5/7 >> Vanc 5/3>>5/4; 5/7 >>  Rapid strep: neg  5/2 BCx ngtd Sputum ng x 1d Resp panel: Rhinovirus pos  5/3 blood x 2 ngx1  Plan: -Discontinue ceftriaxone -Meropenem 1g/12h -Vancomycin 1500 mg IV x1 then 750 mg IV q12h -Monitor renal fx, VT as needed -F/u repeat blood cx   Height: 5\' 2"  (157.5 cm) Weight: 160 lb 7.9 oz (72.8 kg) IBW/kg (Calculated) : 50.1  Temp (24hrs), Avg:100.1 F (37.8 C), Min:98.8 F (37.1 C), Max:102.9 F (39.4 C)  Recent Labs  Lab 12/06/17 2055  12/07/17 0449  12/07/17 1218  12/08/17 0747 12/08/17 0748 12/08/17 0952  12/09/17 0412 12/09/17 2042 12/10/17 0414 12/11/17 0350 12/11/17 1928 12/12/17 0351  WBC  --   --   --   --   --   --  54.0*  --   --   --  31.7*  --  24.9* 22.2*  --  21.6*  CREATININE  --    < >  --    < >  --    < > 1.34*  --   --    < > 1.32* 1.46* 1.33* 0.97 0.93 0.97  LATICACIDVEN 1.56  --  2.7*  --  1.6  --   --  1.1 0.6  --   --   --   --   --   --   --    < > = values in this interval not displayed.    Estimated Creatinine Clearance: 74.9 mL/min (by C-G formula based on SCr of 0.97 mg/dL).    Allergies  Allergen Reactions  . Penicillins Hives    Has patient had a PCN reaction causing immediate rash, facial/tongue/throat swelling, SOB or lightheadedness with hypotension: Yes Has patient had a PCN reaction causing severe rash involving mucus membranes or skin necrosis: Unk Has patient had a PCN reaction that required hospitalization: Yes Has patient had a PCN reaction occurring within the last 10 years: No If all of the above answers are "NO", then may proceed with Cephalosporin use.   Marland Kitchen  Doxycycline Other (See Comments)    Patient cannot recall the reaction     Harvel Quale 12/12/2017 8:40 AM

## 2017-12-12 NOTE — Progress Notes (Signed)
Nutrition Follow-up  DOCUMENTATION CODES:   Not applicable  INTERVENTION:   Change TF to better meet re-estimated needs and since Propofol has been d/c:  Vital AF 1.2 at 55 ml/h (1320 ml per day)  Provides 1584 kcal, 99 gm protein, 1071 ml free water daily  NUTRITION DIAGNOSIS:   Inadequate oral intake related to inability to eat as evidenced by NPO status.  Ongoing  GOAL:   Patient will meet greater than or equal to 90% of their needs  Met with TF  MONITOR:   Vent status, TF tolerance, Labs, I & O's  ASSESSMENT:   36 yo female with PMH of vitamin B1 deficiency, B12 deficiency, anxiety, asthma, IVDU, smoker, ADHD, HTN, GERD, Crohn's disease, fibromyalgia, and osteoarthritis who was admitted on 5/1 with PNA and sepsis. Required intubation on 5/3 due to agitation and hypoxia.  Discussed patient with RN today. Propofol is off. Tolerating TF well.  Currently receiving Vital High Protein via OGT at 50 ml/h (1200 ml/day) to provide 1200 kcals, 105 gm protein, 1003 ml free water daily. Free water flushes 300 ml every 6 hours. Not quite meeting kcal needs now that Propofol is off. Patient remains intubated on ventilator support MV: 9.4 L/min Temp (24hrs), Avg:100.6 F (38.1 C), Min:98.8 F (37.1 C), Max:102.9 F (39.4 C)  Propofol: off Labs reviewed. Sodium 147 (H) CBG's: 145-136 Medications reviewed and include Colace, Lasix, KCl, folic acid, thiamine.  I/O + 5.6 L since admission, weight trending up  Diet Order:   Diet Order    None      EDUCATION NEEDS:   No education needs have been identified at this time  Skin:  Skin Assessment: Reviewed RN Assessment  Last BM:  5/6  Height:   Ht Readings from Last 1 Encounters:  12/08/17 5' 2" (1.575 m)    Weight:   Wt Readings from Last 1 Encounters:  12/12/17 160 lb 7.9 oz (72.8 kg)    Ideal Body Weight:  50 kg  BMI:  Body mass index is 29.35 kg/m.  Estimated Nutritional Needs:   Kcal:   1610  Protein:  85-100 gm  Fluid:  >/= 1.5 L    , RD, LDN, CNSC Pager [phone] After Hours Pager [phone]  

## 2017-12-12 NOTE — Progress Notes (Signed)
PULMONARY / CRITICAL CARE MEDICINE   Name: [name] MRN: [id number] DOB: Dec 16, 1980    ADMISSION DATE:  12/06/2017 CONSULTATION DATE:  12/08/17  REFERRING MD:  Lonny Prude  CHIEF COMPLAINT:  Dyspnea  BRIEF HISTORY OF PRESENT ILLNESS:   37 y/o female with crohn's, EtOH abuse, chronic narcotic use admitted with cough, chest pain dyspnea.  Developed ARDS either from aspiration pneumonitis or rhinovirus.  SUBJECTIVE:  Diuresed well, no events overnight  VITAL SIGNS: BP (!) 98/58   Pulse 87   Temp (!) 102.9 F (39.4 C) (Oral) Comment: Notified RN Keely  Resp (!) 26   Ht 5' 2"  (1.575 m)   Wt 160 lb 7.9 oz (72.8 kg)   LMP 12/03/2017 (Exact Date)   SpO2 96%   BMI 29.35 kg/m   HEMODYNAMICS: CVP:  [0 mmHg] 0 mmHg  VENTILATOR SETTINGS: Vent Mode: PRVC FiO2 (%):  [50 %] 50 % Set Rate:  [24 bmp] 24 bmp Vt Set:  [400 mL] 400 mL PEEP:  [8 cmH20] 8 cmH20 Plateau Pressure:  [14 cmH20-26 cmH20] 21 cmH20  INTAKE / OUTPUT: I/O last 3 completed shifts: In: 4887.6 [I.V.:1547.6; NG/GT:2790; IV Piggyback:550] Out: 7025 [Urine:7025]  PHYSICAL EXAMINATION:  General:  Sedated, not paralyzed, withdraws to pain but not following commands HENT: [place]/AT, PERRL, EOM-sluggish and MMM PULM: Diffuse crackles audible CV: RRR, Nl S1/S2 and -M/R/G GI: Soft, NT, ND and +BS MSK: diminished bulk and tone Neuro: Sedated, withdraws to pain but not command  LABS:  BMET Recent Labs  Lab 12/11/17 0350 12/11/17 1928 12/12/17 0351  NA 146* 146* 147*  K 2.6* 3.8 3.7  CL 103 103 101  CO2 35* 35* 35*  BUN 18 15 14   CREATININE 0.97 0.93 0.97  GLUCOSE 131* 131* 151*   Electrolytes Recent Labs  Lab 12/09/17 0412 12/09/17 1740  12/11/17 0350 12/11/17 1928 12/12/17 0351  CALCIUM 6.2*  --    < > 6.3* 6.5* 7.2*  MG 2.1 2.1  --   --   --  2.1  PHOS 3.5 3.7  --   --   --  2.6   < > = values in this interval not displayed.   CBC Recent Labs  Lab 12/10/17 0414 12/11/17 0350 12/12/17 0351   WBC 24.9* 22.2* 21.6*  HGB 8.1* 8.5* 9.5*  HCT 26.1* 27.5* 31.7*  PLT 381 356 454*   Coag's Recent Labs  Lab 12/08/17 0747  INR 1.35   Sepsis Markers Recent Labs  Lab 12/07/17 0445  12/07/17 1218 12/08/17 0747 12/08/17 0748 12/08/17 0952  LATICACIDVEN  --    < > 1.6  --  1.1 0.6  PROCALCITON 88.50  --   --  32.13  --   --    < > = values in this interval not displayed.   ABG Recent Labs  Lab 12/09/17 0416 12/11/17 0943 12/12/17 0230  PHART 7.381 7.520* 7.476*  PCO2ART 34.9 48.3* 54.6*  PO2ART 94.0 78.0* 95.0   Liver Enzymes Recent Labs  Lab 12/06/17 1957 12/07/17 1047 12/08/17 0747  AST 14* 20 23  ALT 8* QUANTITY NOT SUFFICIENT, UNABLE TO PERFORM TEST 12*  ALKPHOS 155* 168* 218*  BILITOT 1.0 QUANTITY NOT SUFFICIENT, UNABLE TO PERFORM TEST 0.4  ALBUMIN 2.2* 2.4* 1.8*   Cardiac Enzymes Recent Labs  Lab 12/08/17 0747 12/08/17 1106  TROPONINI 0.31* <0.03   Glucose Recent Labs  Lab 12/11/17 1118 12/11/17 1536 12/11/17 1931 12/11/17 2311 12/12/17 0330 12/12/17 0732  GLUCAP 150* 135* 117* 132*  139* 145*   Imaging No results found. STUDIES:  5/2 ct abd> no acute process, bowel looks OK, mild gallbladder distention 5/2 CT sinus> generalized acute sinusitis with fluid levels, possible otomastoiditis 5/3 echo > LVEF 65-70, PA pressure 42 mm Hg  CULTURES: 5/1 blood >NTD  5/3 blood > NTD 5/3 resp > NTD 5/7 Blood>>> 5/7 Urine>>> 5/7 Sputum>>> 5/2 resp viral panel > rhinovirus 5/2 rapid strep throat swab > negative  ANTIBIOTICS: 5/1 ceftriaxone > 5/2 5/2 azithro > 5/2 5/3 vanc > 5/4>>>5/7 5/3 Mero > 5/4 >>>5/7 5/4 ceftriaxone > 5/7  SIGNIFICANT EVENTS: 5/3 move to ICU, intubated, paralyzed, prone  LINES/TUBES: 5/3 ETT >  5/3 L IJ CVL >  5/3 R radial arterial line >   DISCUSSION: 38 y/o female with crohns, etoh abuse, chronic narcotic use (NO IVDU, chart incorrect) who has ARDS from either rhinovirus or aspiration pneumonitis.   Oxygenation improving slowly  ASSESSMENT / PLAN:  PULMONARY A: ARDS > improving but still has severe acute lung disease requiring high vent suppot 5/5 Severe vent dissynchrony P:   D/C nimbex of the MAR Continue precedex Continue to wean PEEP/FiO2 per ARDS protocol, goal SaO2 > 88%, paO2 target 55-65 Diurese aiming for CVP < 4 CXR portable in AM Titrate O2 for sat of 88-92%  CARDIOVASCULAR A:  Mild sinus tachycardia per fever P:  Tele Monitor hemodynamics Ice packs for temp control Maintain a-line in place  RENAL A:   Hypokalemia and hypocalcemia Hypernatremia P:   Monitor BMET and UOP Replace electrolytes as needed Increase free water 300 q4 Lasix 40 mg IV q6 x3 doses  GASTROINTESTINAL A:   Crohns, history of abdominal surgery but per mother not on any chronic medications for this Chronic diarrhea, now with no bowel movements P:   Continue tube feeding Continue pepcid D/C protonix Continue colace D/C MOM and [place]  HEMATOLOGIC A:   Anemia of chronic disease, no bleeding P:  Monitor for bleeding  Transfuse if Hgb < 7gm/dL  INFECTIOUS A:   Severe CAP > from rhinovirus Aspiration pneumonitis possible Sinusitis/possible mastoiditis per CT scan Fever 5/5 due to the above, overall clinically improving despite this so doubt new infection P:   D/C ceftriaxone Reculture today Merrem and vanc as ordered  ENDOCRINE A:   Mild Hyperglycemia P:   SSI CBGs  NEUROLOGIC A:   Need for heavy sedation in setting of severe ARDS Narcotic dependence > does not use IV drugs EtOH use Vent dyssynchrony when weaning Nimbex P:   RASS goal -5 today Precedex drip as ordered Thiamine/folate D/C versed Fentanyl drip  FAMILY  - Updates: No family bedside 5/6.  - Inter-disciplinary family meet or Palliative Care meeting due by:  day 7  The patient is critically ill with multiple organ systems failure and requires high complexity decision making for assessment  and support, frequent evaluation and titration of therapies, application of advanced monitoring technologies and extensive interpretation of multiple databases.   Critical Care Time devoted to patient care services described in this note is  36  Minutes. This time reflects time of care of this signee Dr Jennet Maduro. This critical care time does not reflect procedure time, or teaching time or supervisory time of PA/NP/Med student/Med Resident etc but could involve care discussion time.  Rush Farmer, M.D. [facility] Pulmonary/Critical Care Medicine. Pager: [phone]. After hours pager: [phone].  12/12/2017, 8:08 AM

## 2017-12-13 ENCOUNTER — Inpatient Hospital Stay (HOSPITAL_COMMUNITY): Payer: Medicaid Other

## 2017-12-13 DIAGNOSIS — B9789 Other viral agents as the cause of diseases classified elsewhere: Secondary | ICD-10-CM

## 2017-12-13 DIAGNOSIS — F1010 Alcohol abuse, uncomplicated: Secondary | ICD-10-CM

## 2017-12-13 DIAGNOSIS — J329 Chronic sinusitis, unspecified: Secondary | ICD-10-CM

## 2017-12-13 DIAGNOSIS — J690 Pneumonitis due to inhalation of food and vomit: Secondary | ICD-10-CM

## 2017-12-13 DIAGNOSIS — J9601 Acute respiratory failure with hypoxia: Secondary | ICD-10-CM

## 2017-12-13 DIAGNOSIS — Z9911 Dependence on respirator [ventilator] status: Secondary | ICD-10-CM

## 2017-12-13 DIAGNOSIS — K5090 Crohn's disease, unspecified, without complications: Secondary | ICD-10-CM

## 2017-12-13 DIAGNOSIS — K219 Gastro-esophageal reflux disease without esophagitis: Secondary | ICD-10-CM

## 2017-12-13 DIAGNOSIS — F17210 Nicotine dependence, cigarettes, uncomplicated: Secondary | ICD-10-CM

## 2017-12-13 DIAGNOSIS — R509 Fever, unspecified: Secondary | ICD-10-CM

## 2017-12-13 DIAGNOSIS — F1110 Opioid abuse, uncomplicated: Secondary | ICD-10-CM

## 2017-12-13 DIAGNOSIS — F411 Generalized anxiety disorder: Secondary | ICD-10-CM

## 2017-12-13 LAB — GLUCOSE, CAPILLARY
Glucose-Capillary: 165 mg/dL — ABNORMAL HIGH (ref 65–99)
Glucose-Capillary: 133 mg/dL — ABNORMAL HIGH (ref 65–99)
Glucose-Capillary: 160 mg/dL — ABNORMAL HIGH (ref 65–99)
Glucose-Capillary: 111 mg/dL — ABNORMAL HIGH (ref 65–99)
Glucose-Capillary: 139 mg/dL — ABNORMAL HIGH (ref 65–99)
Glucose-Capillary: 129 mg/dL — ABNORMAL HIGH (ref 65–99)

## 2017-12-13 LAB — CBC
HCT: 32.7 % — ABNORMAL LOW (ref 36.0–46.0)
Hemoglobin: 9.4 g/dL — ABNORMAL LOW (ref 12.0–15.0)
MCH: 23.6 pg — ABNORMAL LOW (ref 26.0–34.0)
MCHC: 28.7 g/dL — ABNORMAL LOW (ref 30.0–36.0)
MCV: 82.2 fL (ref 78.0–100.0)
Platelets: 530 10*3/uL — ABNORMAL HIGH (ref 150–400)
RBC: 3.98 MIL/uL (ref 3.87–5.11)
RDW: 18.5 % — ABNORMAL HIGH (ref 11.5–15.5)
WBC: 25.2 10*3/uL — ABNORMAL HIGH (ref 4.0–10.5)

## 2017-12-13 LAB — POCT I-STAT 3, ART BLOOD GAS (G3+)
Acid-Base Excess: 13 mmol/L — ABNORMAL HIGH (ref 0.0–2.0)
Bicarbonate: 38.6 mmol/L — ABNORMAL HIGH (ref 20.0–28.0)
O2 Saturation: 99 %
Patient temperature: 98.2
TCO2: 40 mmol/L — ABNORMAL HIGH (ref 22–32)
pCO2 arterial: 53.6 mmHg — ABNORMAL HIGH (ref 32.0–48.0)
pH, Arterial: 7.464 — ABNORMAL HIGH (ref 7.350–7.450)
pO2, Arterial: 114 mmHg — ABNORMAL HIGH (ref 83.0–108.0)

## 2017-12-13 LAB — PHOSPHORUS: Phosphorus: 5.1 mg/dL — ABNORMAL HIGH (ref 2.5–4.6)

## 2017-12-13 LAB — CULTURE, BLOOD (ROUTINE X 2)
Culture: NO GROWTH
Culture: NO GROWTH
Special Requests: ADEQUATE
Special Requests: ADEQUATE

## 2017-12-13 LAB — URINE CULTURE: Culture: NO GROWTH

## 2017-12-13 LAB — TRIGLYCERIDES: Triglycerides: 205 mg/dL — ABNORMAL HIGH (ref <150)

## 2017-12-13 LAB — MAGNESIUM: Magnesium: 2.2 mg/dL (ref 1.7–2.4)

## 2017-12-13 LAB — BASIC METABOLIC PANEL
Anion gap: 12 (ref 5–15)
BUN: 24 mg/dL — ABNORMAL HIGH (ref 6–20)
CO2: 34 mmol/L — ABNORMAL HIGH (ref 22–32)
Calcium: 8.4 mg/dL — ABNORMAL LOW (ref 8.9–10.3)
Chloride: 100 mmol/L — ABNORMAL LOW (ref 101–111)
Creatinine, Ser: 0.91 mg/dL (ref 0.44–1.00)
GFR calc Af Amer: >60 mL/min (ref >60)
GFR calc non Af Amer: >60 mL/min (ref >60)
Glucose, Bld: 153 mg/dL — ABNORMAL HIGH (ref 65–99)
Potassium: 3.6 mmol/L (ref 3.5–5.1)
Sodium: 146 mmol/L — ABNORMAL HIGH (ref 135–145)

## 2017-12-13 IMAGING — DX DG CHEST 1V PORT
1 series · 1 of 1 positions shown · non-contrast
Comparison: [DATE]

CLINICAL DATA: Shortness of breath

EXAM:
PORTABLE CHEST 1 VIEW

[chest ap]
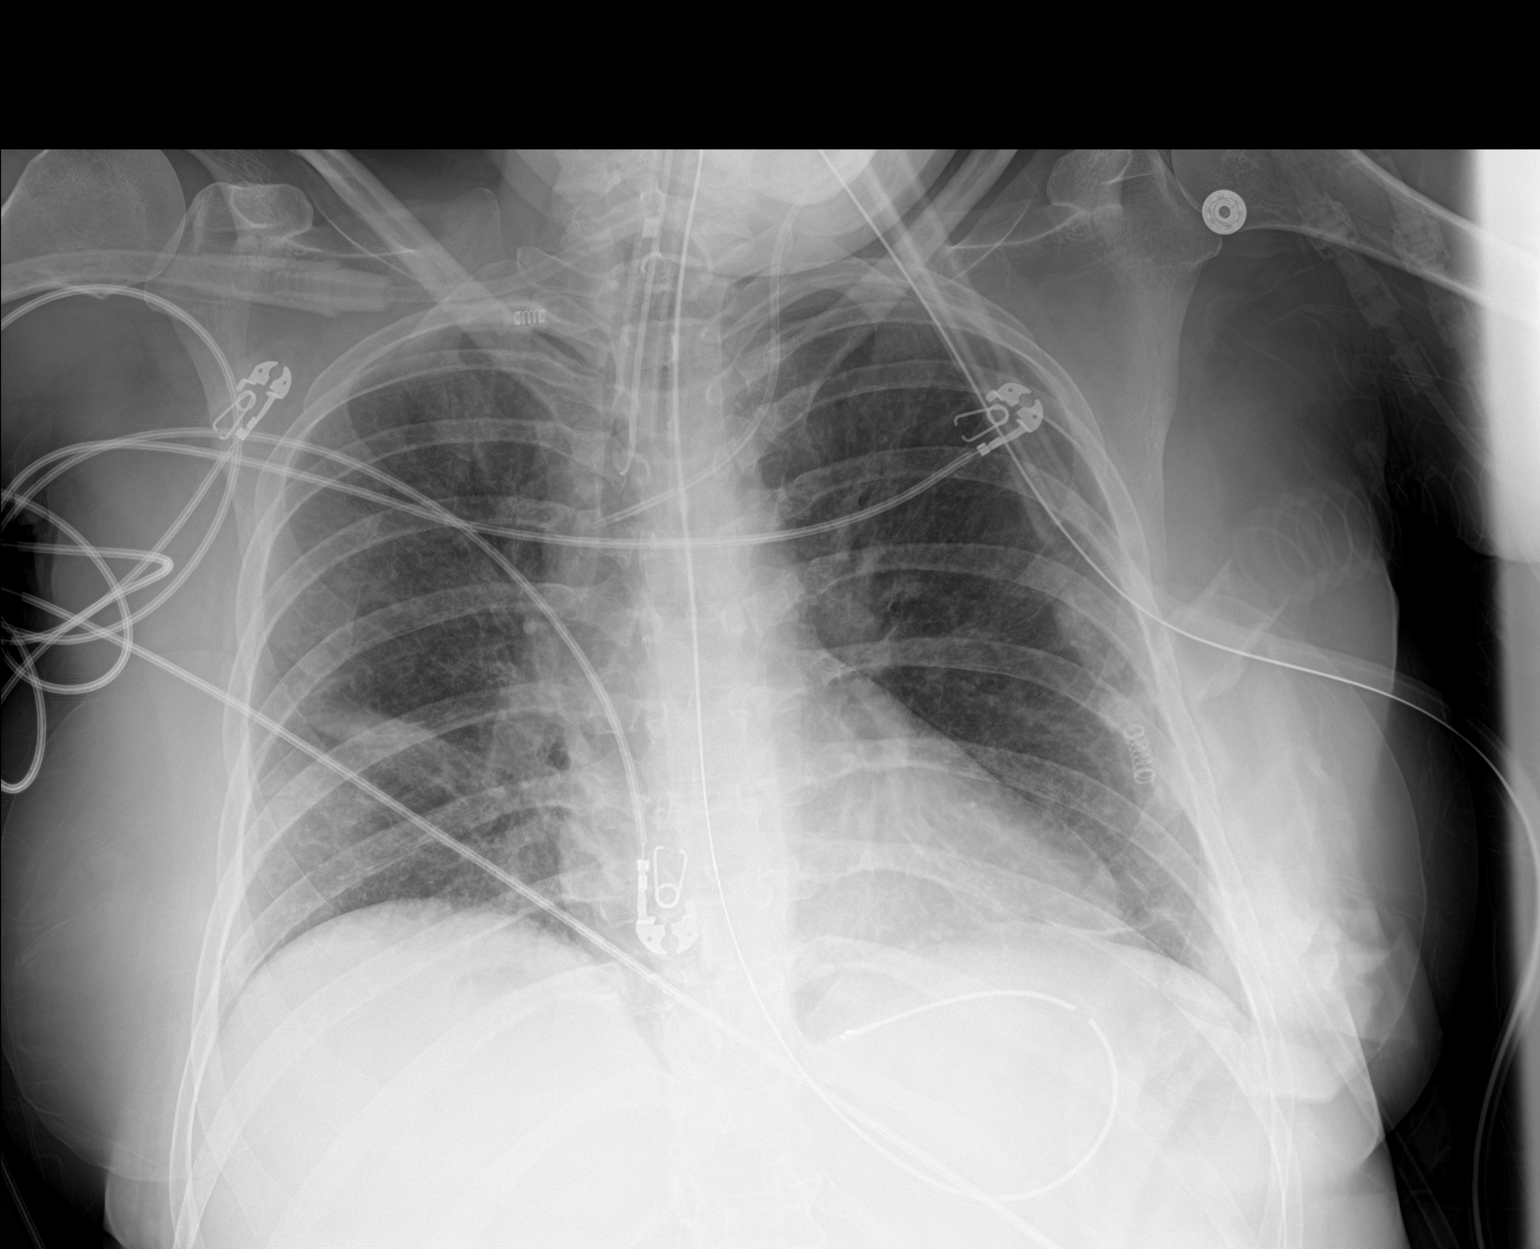

[1 of 1 positions shown; findings below may reference images not displayed]

FINDINGS: Cardiac shadow is stable. Endotracheal tube, left jugular central
line and nasogastric catheter are again seen and stable. The lungs
are well aerated bilaterally with improved aeration in the bases
bilaterally. Some residual atelectatic changes are noted in the
bases. No new focal abnormality is seen.
IMPRESSION: Improved aeration with some residual bibasilar atelectasis.

## 2017-12-13 IMAGING — DX DG CHEST 1V PORT
1 series · 1 of 1 positions shown · non-contrast
Comparison: [DATE] and prior radiographs

CLINICAL DATA: 36-year-old female-evaluate endotracheal tube
placement.

EXAM:
PORTABLE CHEST 1 VIEW

[chest ap]
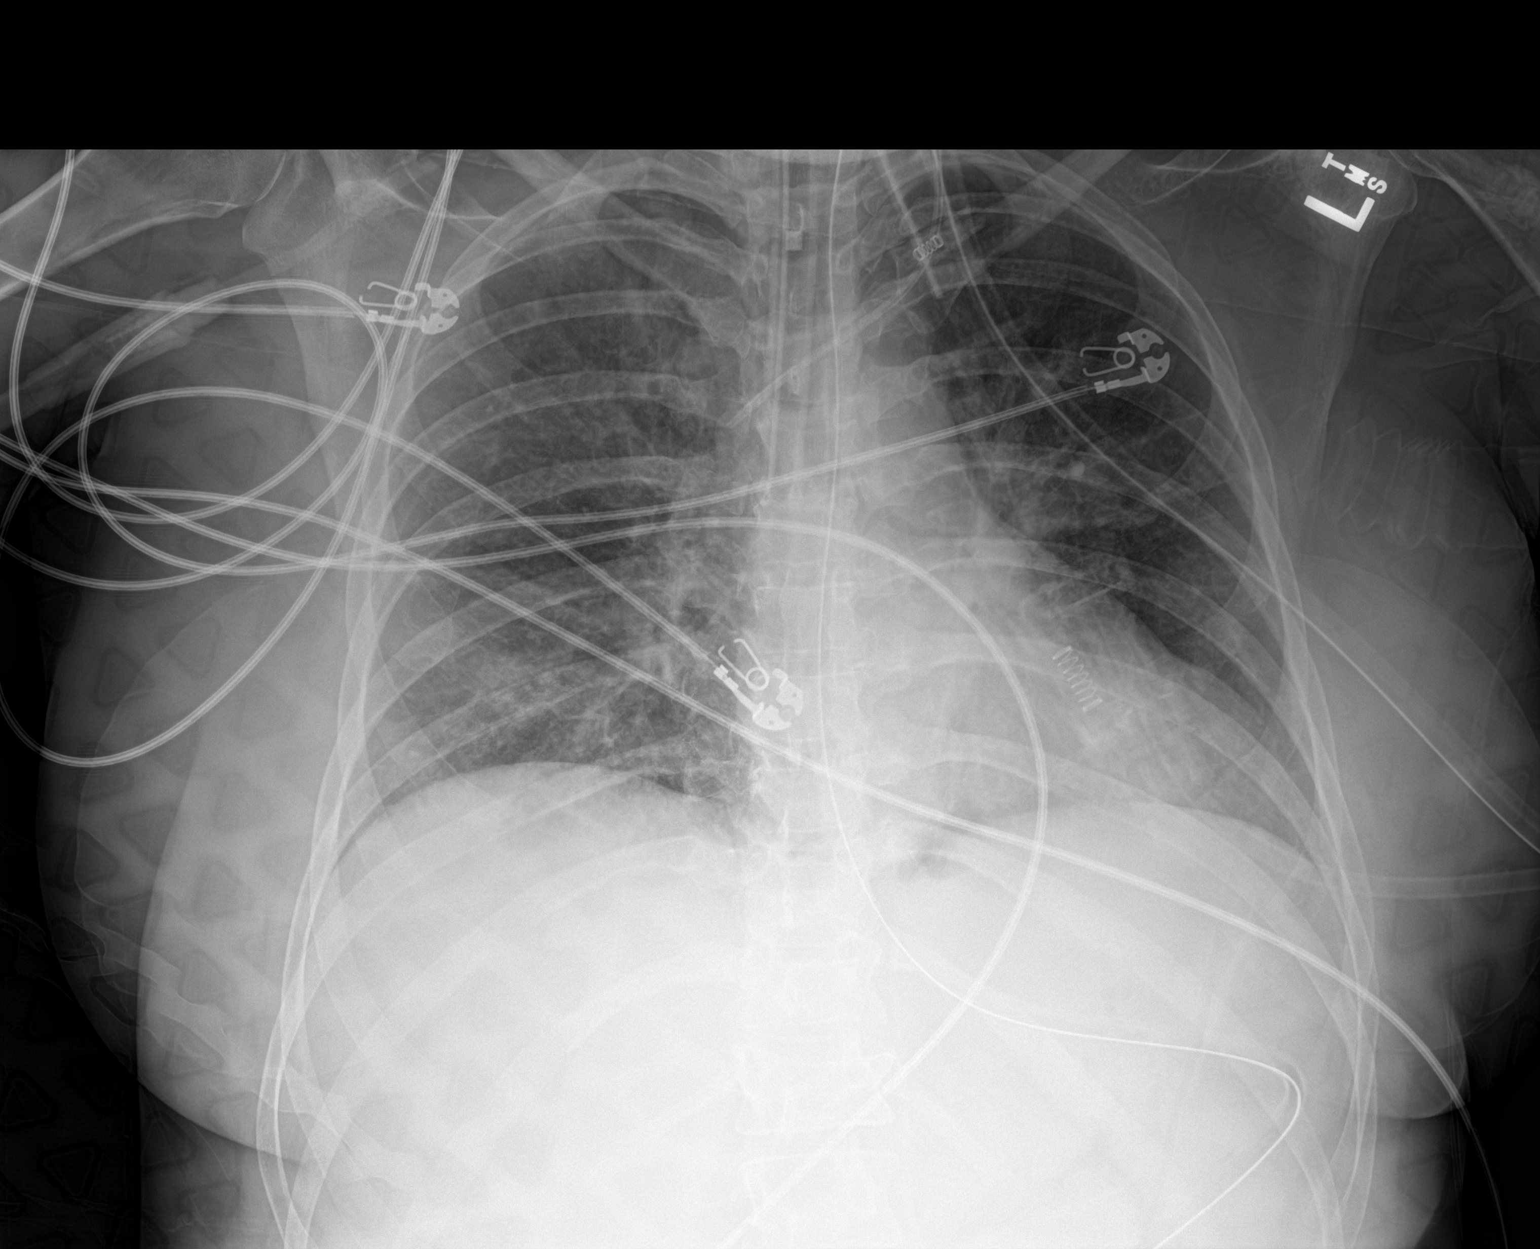

[1 of 1 positions shown; findings below may reference images not displayed]

FINDINGS: Cardiomediastinal silhouette is unchanged. Pulmonary vascular
congestion again noted.

Endotracheal tube is identified with tip extending into the RIGHT
mainstem bronchus-recommend 3 cm retraction.

NG tube enters stomach and LEFT IJ central venous catheter again
noted with tip overlying the UPPER SVC.

No other changes identified.

There is no evidence of pneumothorax.
IMPRESSION: 1. Endotracheal tube with tip extending into the RIGHT mainstem
bronchus-recommend 3 cm retraction. No other significant change.
BIJAN, nurse for this patient, was notified of these results on
[DATE] at [DATE] p.m..

## 2017-12-13 MED ORDER — ETOMIDATE 2 MG/ML IV SOLN
20.0000 mg | Freq: Once | INTRAVENOUS | Status: AC
  Administered 2017-12-13: 20 mg via INTRAVENOUS

## 2017-12-13 MED ORDER — ACETAZOLAMIDE SODIUM 500 MG IJ SOLR
250.0000 mg | Freq: Four times a day (QID) | INTRAMUSCULAR | Status: AC
  Administered 2017-12-13 (×3): 250 mg via INTRAVENOUS
  Filled 2017-12-13 (×3): qty 250

## 2017-12-13 MED ORDER — FAMOTIDINE 40 MG/5ML PO SUSR
20.0000 mg | Freq: Two times a day (BID) | ORAL | Status: DC
Start: 2017-12-13 — End: 2017-12-14
  Administered 2017-12-13 – 2017-12-14 (×3): 20 mg
  Filled 2017-12-13 (×4): qty 2.5

## 2017-12-13 MED ORDER — ORAL CARE MOUTH RINSE
15.0000 mL | Freq: Four times a day (QID) | OROMUCOSAL | Status: DC
  Administered 2017-12-13 – 2017-12-18 (×15): 15 mL via OROMUCOSAL

## 2017-12-13 MED ORDER — FENTANYL CITRATE (PF) 100 MCG/2ML IJ SOLN
INTRAMUSCULAR | Status: AC
  Administered 2017-12-13: 100 ug
  Filled 2017-12-13: qty 2

## 2017-12-13 MED ORDER — STERILE WATER FOR INJECTION IJ SOLN
INTRAMUSCULAR | Status: AC
  Administered 2017-12-13: 16:00:00
  Filled 2017-12-13: qty 10

## 2017-12-13 MED ORDER — PROPOFOL 1000 MG/100ML IV EMUL
5.0000 ug/kg/min | INTRAVENOUS | Status: DC
  Administered 2017-12-13: 20 ug/kg/min via INTRAVENOUS
  Administered 2017-12-14: 28 ug/kg/min via INTRAVENOUS
  Filled 2017-12-13 (×2): qty 100

## 2017-12-13 MED ORDER — MIDAZOLAM HCL 2 MG/2ML IJ SOLN
1.0000 mg | INTRAMUSCULAR | Status: DC | PRN
  Administered 2017-12-13: 2 mg via INTRAVENOUS

## 2017-12-13 MED ORDER — ROCURONIUM BROMIDE 50 MG/5ML IV SOLN
50.0000 mg | Freq: Once | INTRAVENOUS | Status: AC
  Administered 2017-12-13: 50 mg via INTRAVENOUS

## 2017-12-13 MED ORDER — POTASSIUM CHLORIDE 20 MEQ/15ML (10%) PO SOLN
40.0000 meq | Freq: Three times a day (TID) | ORAL | Status: AC
  Administered 2017-12-13 (×2): 40 meq
  Filled 2017-12-13 (×3): qty 30

## 2017-12-13 MED ORDER — MIDAZOLAM HCL 2 MG/2ML IJ SOLN
INTRAMUSCULAR | Status: AC
  Administered 2017-12-13: 2 mg
  Filled 2017-12-13: qty 2

## 2017-12-13 MED ORDER — MIDAZOLAM HCL 2 MG/2ML IJ SOLN
INTRAMUSCULAR | Status: AC
  Filled 2017-12-13: qty 2

## 2017-12-13 NOTE — Progress Notes (Signed)
Pt extremely agitated, versed turned up to max of 1.5.  Pt self extubated ~1120. RT at bedside, bag mask on, RR in low 20s, SpO2 '@100'$ %, all other vitals stable. Dr. Nelda Marseille notified. RSI kit and supplies ready if needed.

## 2017-12-13 NOTE — Procedures (Signed)
Intubation Procedure Note [name] [id number] November 24, 1980  Procedure: Intubation Indications: Respiratory insufficiency  Procedure Details Consent: Unable to obtain consent because of emergent medical necessity. Time Out: Verified patient identification, verified procedure, site/side was marked, verified correct patient position, special equipment/implants available, medications/allergies/relevent history reviewed, required imaging and test results available.  Performed  Maximum sterile technique was used including antiseptics, gloves, hand hygiene and mask.  MAC    Evaluation Hemodynamic Status: BP stable throughout; O2 sats: stable throughout Patient's Current Condition: stable Complications: No apparent complications Patient did tolerate procedure well. Chest X-ray ordered to verify placement.  CXR: pending.   Jennet Maduro 12/13/2017

## 2017-12-13 NOTE — Consult Note (Signed)
[facility]    Date of Admission:  12/06/2017   Total days of antibiotics: 7 azithro--> ceftriaxone -->           merrem/vanco.                Reason for Consult: fever, aspiration    Referring Provider: Yacoub   Assessment: Fever Aspiration Rhinovirus pna Sinusitis ETOH abuse, narcotic abuse  Plan: 1. Consider stopping vanco soon 2. Weaning trials underway 3. If fever persists, consider repeat CT of sinuses, chest 4. Check hepatitis panel (HIV -)  Comment- She is certainly at risk for aspiration as her original event. Broad coverage for strep, GNR and anaerobes would be important (unasyn).  Her lines (IJ and PIC) appear clean.  Could she be withdrawing (+opiates on adm)?  Thank you so much for this interesting consult,  Principal Problem:   Bronchitis Active Problems:   Anxiety state   Essential hypertension   GERD   CROHN'S DISEASE-SMALL INTESTINE   Abdominal pain, generalized   Hypokalemia   Hypocalcemia   Hypomagnesemia   Sepsis ([place])   AKI (acute kidney injury) ([place])   Acute sinusitis   Acute respiratory failure with hypoxia ([place])   . acetaZOLAMIDE  250 mg Intravenous Q6H  . arformoterol  15 mcg Nebulization BID  . budesonide (PULMICORT) nebulizer solution  0.5 mg Nebulization BID  . chlorhexidine gluconate (MEDLINE KIT)  15 mL Mouth Rinse BID  . Chlorhexidine Gluconate Cloth  6 each Topical Daily  . docusate  100 mg Per Tube BID  . famotidine  20 mg Per Tube BID  . folic acid  1 mg Oral Daily  . free water  300 mL Per Tube Q6H  . heparin  5,000 Units Subcutaneous Q8H  . insulin aspart  1-3 Units Subcutaneous Q4H  . mouth rinse  15 mL Mouth Rinse 10 times per day  . potassium chloride  40 mEq Per Tube TID  . sodium chloride flush  10-40 mL Intracatheter Q12H  . thiamine  100 mg Per Tube Daily    HPI: TYEISHA DINAN is a 37 y.o. female with hx of Crohn's (on no rx), GERD, narcotic abuse (no IVDA), ETOH abuse;  adm on 5-1 with 4 days of runny nose, ear pain and drainage, pleuritic CP and sore throat. She also c/o chills.  On adm had WBC 36.2 and Cr 1.94. Her CXR was read as (-).  She was dx as bronchitis and sepsis. Started on z-pack/ceftriaxone and solumedrol (for possible COPD and possible worsening of Crohn's with n/v, diarrhea).   By 5-3 she became altered and required emergent intubation. She was seen by CCM and felt to have ARDS, worsened LUL infiltrate. Etiology aspiration or rhinovirus (resp panel +).  Since transfer to ICU, she has been diuresing, she has remained intubated and sedated.  Over last 24h has had fever 102-103. Anbx broadened to merrem/vanco.   UA not c/i infexn BCx pending Resp Cx pending.   Review of Systems: Review of Systems  Unable to perform ROS: Intubated  last BM 5-6  Past Medical History:  Diagnosis Date  . Abdominal pain, unspecified site 03/24/2009  . 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 ADHD 12/02/2009  . Allergic rhinitis, cause unspecified 01/21/2011  . ANXIETY 03/24/2009  . B12 DEFICIENCY 04/28/2009  . Bronchitis 12/2017  . BURSITIS, RIGHT KNEE 02/05/2010  . Cellulitis and abscess of leg, except foot 02/05/2010  . Cervicalgia 12/02/2009  .  COMMON MIGRAINE 02/05/2010  . CROHN'S [facility] INTESTINE 05/19/2009  . ECZEMA 05/20/2010  . Endometriosis 08/04/2011  . Fibromyalgia   . GERD 03/24/2009  . HEADACHE, CHRONIC 03/24/2009  . HYPERTENSION 03/24/2009  . Osteoarthritis   . OTITIS MEDIA, LEFT 08/12/2010  . SMOKER 12/02/2009  . Spine pain 01/21/2011   neck and thoracic spine  . VITAMIN B1 DEFICIENCY 09/21/2009  . Wheezing 08/12/2010    Social History   Tobacco Use  . Smoking status: Current Every Day Smoker    Packs/day: 0.50    Years: 14.00    Pack years: 7.00    Types: Cigarettes  . Smokeless tobacco: Never Used  . Tobacco comment: 5 daily  Substance Use Topics  . Alcohol use: Yes    Comment: occasionally  . Drug use: Not Currently    Types: Other-see comments     Comment: oral narcotics, family says she does not use IV drugs    Family History  Problem Relation Age of Onset  . Cancer Mother        breast  . Clotting disorder Mother   . Cancer Maternal Grandmother        Stomach Cancer  . Cancer Maternal Grandfather        Esophageal Cancer     Medications:  Scheduled: . acetaZOLAMIDE  250 mg Intravenous Q6H  . arformoterol  15 mcg Nebulization BID  . budesonide (PULMICORT) nebulizer solution  0.5 mg Nebulization BID  . chlorhexidine gluconate (MEDLINE KIT)  15 mL Mouth Rinse BID  . Chlorhexidine Gluconate Cloth  6 each Topical Daily  . docusate  100 mg Per Tube BID  . famotidine  20 mg Per Tube BID  . folic acid  1 mg Oral Daily  . free water  300 mL Per Tube Q6H  . heparin  5,000 Units Subcutaneous Q8H  . insulin aspart  1-3 Units Subcutaneous Q4H  . mouth rinse  15 mL Mouth Rinse 10 times per day  . potassium chloride  40 mEq Per Tube TID  . sodium chloride flush  10-40 mL Intracatheter Q12H  . thiamine  100 mg Per Tube Daily    Abtx:  Anti-infectives (From admission, onward)   Start     Dose/Rate Route Frequency Ordered Stop   12/12/17 2300  vancomycin (VANCOCIN) IVPB 750 mg/150 ml premix     750 mg 150 mL/hr over 60 Minutes Intravenous Every 12 hours 12/12/17 0846     12/12/17 1100  meropenem (MERREM) 1 g in sodium chloride 0.9 % 100 mL IVPB     1 g 200 mL/hr over 30 Minutes Intravenous Every 12 hours 12/12/17 0840     12/12/17 1000  vancomycin (VANCOCIN) 1,500 mg in sodium chloride 0.9 % 500 mL IVPB     1,500 mg 250 mL/hr over 120 Minutes Intravenous  Once 12/12/17 0840 12/12/17 1249   12/09/17 0900  cefTRIAXone (ROCEPHIN) 1 g in sodium chloride 0.9 % 100 mL IVPB  Status:  Discontinued    Note to Pharmacy:  Please adjust dosing as appropriate for indication   1 g 200 mL/hr over 30 Minutes Intravenous Every 24 hours 12/09/17 0824 12/12/17 0840   12/08/17 2200  vancomycin (VANCOCIN) IVPB 750 mg/150 ml premix  Status:   Discontinued     750 mg 150 mL/hr over 60 Minutes Intravenous Every 12 hours 12/08/17 0940 12/09/17 0809   12/08/17 0900  meropenem (MERREM) 1 g in sodium chloride 0.9 % 100 mL IVPB  Status:  Discontinued  1 g 200 mL/hr over 30 Minutes Intravenous Every 8 hours 12/08/17 0812 12/09/17 0809   12/08/17 0815  vancomycin (VANCOCIN) 1,250 mg in sodium chloride 0.9 % 250 mL IVPB     1,250 mg 166.7 mL/hr over 90 Minutes Intravenous  Once 12/08/17 0812 12/08/17 1053   12/07/17 2200  cefTRIAXone (ROCEPHIN) 1 g in sodium chloride 0.9 % 100 mL IVPB  Status:  Discontinued     1 g 200 mL/hr over 30 Minutes Intravenous Every 24 hours 12/07/17 0148 12/08/17 0737   12/07/17 2200  azithromycin (ZITHROMAX) 500 mg in sodium chloride 0.9 % 250 mL IVPB  Status:  Discontinued     500 mg 250 mL/hr over 60 Minutes Intravenous Every 24 hours 12/07/17 0148 12/08/17 0740   12/06/17 2315  cefTRIAXone (ROCEPHIN) 1 g in sodium chloride 0.9 % 100 mL IVPB     1 g 200 mL/hr over 30 Minutes Intravenous  Once 12/06/17 2310 12/06/17 2358   12/06/17 2315  azithromycin (ZITHROMAX) 500 mg in sodium chloride 0.9 % 250 mL IVPB     500 mg 250 mL/hr over 60 Minutes Intravenous  Once 12/06/17 2310 12/07/17 0100        OBJECTIVE: Blood pressure 97/64, pulse 75, temperature 98.2 F (36.8 C), temperature source Rectal, resp. rate (!) 24, height 5' 2"  (1.575 m), weight 67 kg (147 lb 9.6 oz), last menstrual period 12/03/2017, SpO2 100 %.  Physical Exam  Constitutional: She appears well-developed. No distress.  HENT:  Right Ear: External ear normal.  Left Ear: External ear normal.  Eyes: Pupils are equal, round, and reactive to light. EOM are normal.  Neck: Normal range of motion.  L IJ dried blood at site  Cardiovascular: Normal rate, regular rhythm and normal heart sounds.  Pulmonary/Chest: Effort normal. She has rhonchi.  Abdominal: Soft. Bowel sounds are normal. She exhibits no distension.  Musculoskeletal: She  exhibits no edema.  Skin: No rash noted. She is not diaphoretic.    Lab Results Results for orders placed or performed during the hospital encounter of 12/06/17 (from the past 48 hour(s))  I-STAT 3, arterial blood gas (G3+)     Status: Abnormal   Collection Time: 12/11/17  9:43 AM  Result Value Ref Range   pH, Arterial 7.520 (H) 7.350 - 7.450   pCO2 arterial 48.3 (H) 32.0 - 48.0 mmHg   pO2, Arterial 78.0 (L) 83.0 - 108.0 mmHg   Bicarbonate 39.2 (H) 20.0 - 28.0 mmol/L   TCO2 41 (H) 22 - 32 mmol/L   O2 Saturation 96.0 %   Acid-Base Excess 15.0 (H) 0.0 - 2.0 mmol/L   Patient temperature 99.8 F    Collection site ARTERIAL LINE    Drawn by Operator    Sample type ARTERIAL   Glucose, capillary     Status: Abnormal   Collection Time: 12/11/17 11:18 AM  Result Value Ref Range   Glucose-Capillary 150 (H) 65 - 99 mg/dL   Comment 1 Capillary Specimen    Comment 2 Notify RN   Glucose, capillary     Status: Abnormal   Collection Time: 12/11/17  3:36 PM  Result Value Ref Range   Glucose-Capillary 135 (H) 65 - 99 mg/dL   Comment 1 Capillary Specimen    Comment 2 Notify RN   BMET today at 2000     Status: Abnormal   Collection Time: 12/11/17  7:28 PM  Result Value Ref Range   Sodium 146 (H) 135 - 145 mmol/L   Potassium  3.8 3.5 - 5.1 mmol/L   Chloride 103 101 - 111 mmol/L   CO2 35 (H) 22 - 32 mmol/L   Glucose, Bld 131 (H) 65 - 99 mg/dL   BUN 15 6 - 20 mg/dL   Creatinine, Ser 0.93 0.44 - 1.00 mg/dL   Calcium 6.5 (L) 8.9 - 10.3 mg/dL   GFR calc non Af Amer >60 >60 mL/min   GFR calc Af Amer >60 >60 mL/min    Comment: (NOTE) The eGFR has been calculated using the CKD EPI equation. This calculation has not been validated in all clinical situations. eGFR's persistently <60 mL/min signify possible Chronic Kidney Disease.    Anion gap 8 5 - 15    Comment: Performed at [facility] [address], [place], [place] [address]  Glucose, capillary     Status: Abnormal   Collection  Time: 12/11/17  7:31 PM  Result Value Ref Range   Glucose-Capillary 117 (H) 65 - 99 mg/dL   Comment 1 Capillary Specimen    Comment 2 Notify RN   Glucose, capillary     Status: Abnormal   Collection Time: 12/11/17 11:11 PM  Result Value Ref Range   Glucose-Capillary 132 (H) 65 - 99 mg/dL   Comment 1 Capillary Specimen   I-STAT 3, arterial blood gas (G3+)     Status: Abnormal   Collection Time: 12/12/17  2:30 AM  Result Value Ref Range   pH, Arterial 7.476 (H) 7.350 - 7.450   pCO2 arterial 54.6 (H) 32.0 - 48.0 mmHg   pO2, Arterial 95.0 83.0 - 108.0 mmHg   Bicarbonate 40.0 (H) 20.0 - 28.0 mmol/L   TCO2 42 (H) 22 - 32 mmol/L   O2 Saturation 97.0 %   Acid-Base Excess 15.0 (H) 0.0 - 2.0 mmol/L   Patient temperature 100.3 F    Collection site ARTERIAL LINE    Drawn by RT    Sample type ARTERIAL   Glucose, capillary     Status: Abnormal   Collection Time: 12/12/17  3:30 AM  Result Value Ref Range   Glucose-Capillary 139 (H) 65 - 99 mg/dL   Comment 1 Capillary Specimen   BMET in AM     Status: Abnormal   Collection Time: 12/12/17  3:51 AM  Result Value Ref Range   Sodium 147 (H) 135 - 145 mmol/L   Potassium 3.7 3.5 - 5.1 mmol/L   Chloride 101 101 - 111 mmol/L   CO2 35 (H) 22 - 32 mmol/L   Glucose, Bld 151 (H) 65 - 99 mg/dL   BUN 14 6 - 20 mg/dL   Creatinine, Ser 0.97 0.44 - 1.00 mg/dL   Calcium 7.2 (L) 8.9 - 10.3 mg/dL   GFR calc non Af Amer >60 >60 mL/min   GFR calc Af Amer >60 >60 mL/min    Comment: (NOTE) The eGFR has been calculated using the CKD EPI equation. This calculation has not been validated in all clinical situations. eGFR's persistently <60 mL/min signify possible Chronic Kidney Disease.    Anion gap 11 5 - 15    Comment: Performed at [facility] [address] [place] [address]  CBC     Status: Abnormal   Collection Time: 12/12/17  3:51 AM  Result Value Ref Range   WBC 21.6 (H) 4.0 - 10.5 K/uL   RBC 3.90 3.87 - 5.11 MIL/uL   Hemoglobin  9.5 (L) 12.0 - 15.0 g/dL   HCT 31.7 (L) 36.0 - 46.0 %  MCV 81.3 78.0 - 100.0 fL   MCH 24.4 (L) 26.0 - 34.0 pg   MCHC 30.0 30.0 - 36.0 g/dL   RDW 18.5 (H) 11.5 - 15.5 %   Platelets 454 (H) 150 - 400 K/uL    Comment: Performed at [facility] [address], [place], [place] [address]  Magnesium     Status: None   Collection Time: 12/12/17  3:51 AM  Result Value Ref Range   Magnesium 2.1 1.7 - 2.4 mg/dL    Comment: Performed at [facility] [address] [place], [place] [address]  Phosphorus     Status: None   Collection Time: 12/12/17  3:51 AM  Result Value Ref Range   Phosphorus 2.6 2.5 - 4.6 mg/dL    Comment: Performed at [facility] [address] [place], [place] [address]  Glucose, capillary     Status: Abnormal   Collection Time: 12/12/17  7:32 AM  Result Value Ref Range   Glucose-Capillary 145 (H) 65 - 99 mg/dL   Comment 1 Capillary Specimen   Culture, expectorated sputum-assessment     Status: None   Collection Time: 12/12/17  8:38 AM  Result Value Ref Range   Specimen Description SPUTUM    Special Requests NONE    Sputum evaluation      THIS SPECIMEN IS ACCEPTABLE FOR SPUTUM CULTURE Performed at [facility], [place] [address] [place], [place] [address]    Report Status 12/12/2017 FINAL   Urine Culture     Status: None   Collection Time: 12/12/17  8:38 AM  Result Value Ref Range   Specimen Description URINE, CATHETERIZED    Special Requests NONE    Culture      NO GROWTH Performed at [facility], [place] [address] [place], [place] [address]    Report Status 12/13/2017 FINAL   Culture, respiratory (NON-Expectorated)     Status: None (Preliminary result)   Collection Time: 12/12/17  8:38 AM  Result Value Ref Range   Specimen Description SPUTUM    Special Requests NONE Reflexed from [id number]    Gram Stain      MODERATE WBC PRESENT, PREDOMINANTLY PMN NO ORGANISMS SEEN    Culture      NO GROWTH < 24 HOURS Performed at [facility], [place] [address] [place], [place] [address]    Report Status PENDING   Urinalysis, Routine w reflex microscopic     Status: Abnormal   Collection Time: 12/12/17 11:03 AM  Result Value Ref Range   Color, Urine YELLOW YELLOW   APPearance CLEAR CLEAR   Specific Gravity, Urine 1.015 1.005 - 1.030   pH 8.0 5.0 - 8.0   Glucose, UA NEGATIVE NEGATIVE mg/dL   Hgb urine dipstick SMALL (A) NEGATIVE   Bilirubin Urine NEGATIVE NEGATIVE   Ketones, ur NEGATIVE NEGATIVE mg/dL   Protein, ur 100 (A) NEGATIVE mg/dL   Nitrite NEGATIVE NEGATIVE   Leukocytes, UA NEGATIVE NEGATIVE   RBC / HPF 0-5 0 - 5 RBC/hpf   WBC, UA 0-5 0 - 5 WBC/hpf   Bacteria, UA NONE SEEN NONE SEEN   Squamous Epithelial / LPF 0-5 0 - 5    Comment: Please note change in reference range.   Mucus PRESENT     Comment: Performed at [facility], [place] [address], [place], [place] [address]  Glucose, capillary     Status: Abnormal   Collection Time: 12/12/17 11:48  AM  Result Value Ref Range   Glucose-Capillary 136 (H) 65 - 99 mg/dL   Comment 1 Capillary Specimen   Glucose, capillary     Status: Abnormal   Collection Time: 12/12/17  4:12 PM  Result Value Ref Range   Glucose-Capillary 127 (H) 65 - 99 mg/dL   Comment 1 Notify RN   Glucose, capillary     Status: Abnormal   Collection Time: 12/12/17  8:15 PM  Result Value Ref Range   Glucose-Capillary 162 (H) 65 - 99 mg/dL   Comment 1 Notify RN   Glucose, capillary     Status: Abnormal   Collection Time: 12/12/17 11:47 PM  Result Value Ref Range   Glucose-Capillary 164 (H) 65 - 99 mg/dL   Comment 1 Notify RN   I-STAT 3, arterial blood gas (G3+)     Status: Abnormal   Collection Time: 12/13/17  2:49 AM  Result Value Ref Range   pH, Arterial 7.464 (H) 7.350 - 7.450   pCO2 arterial 53.6 (H) 32.0 - 48.0 mmHg   pO2, Arterial 114.0 (H) 83.0 - 108.0 mmHg   Bicarbonate 38.6 (H) 20.0 - 28.0 mmol/L   TCO2 40 (H) 22 - 32 mmol/L   O2 Saturation 99.0 %   Acid-Base  Excess 13.0 (H) 0.0 - 2.0 mmol/L   Patient temperature 98.2 F    Collection site RADIAL, ALLEN'S TEST ACCEPTABLE    Drawn by RT    Sample type ARTERIAL   Glucose, capillary     Status: Abnormal   Collection Time: 12/13/17  3:30 AM  Result Value Ref Range   Glucose-Capillary 165 (H) 65 - 99 mg/dL   Comment 1 Notify RN   CBC     Status: Abnormal (Preliminary result)   Collection Time: 12/13/17  5:11 AM  Result Value Ref Range   WBC 25.2 (H) 4.0 - 10.5 K/uL    Comment: REPEATED TO VERIFY   RBC 3.98 3.87 - 5.11 MIL/uL   Hemoglobin 9.4 (L) 12.0 - 15.0 g/dL    Comment: REPEATED TO VERIFY   HCT 32.7 (L) 36.0 - 46.0 %   MCV 82.2 78.0 - 100.0 fL   MCH 23.6 (L) 26.0 - 34.0 pg   MCHC 28.7 (L) 30.0 - 36.0 g/dL   RDW 18.5 (H) 11.5 - 15.5 %    Comment: Performed at [facility], [place] [address], [place], [place] [address]   Platelets PENDING 150 - 400 K/uL  Basic metabolic panel     Status: Abnormal   Collection Time: 12/13/17  5:11 AM  Result Value Ref Range   Sodium 146 (H) 135 - 145 mmol/L   Potassium 3.6 3.5 - 5.1 mmol/L   Chloride 100 (L) 101 - 111 mmol/L   CO2 34 (H) 22 - 32 mmol/L   Glucose, Bld 153 (H) 65 - 99 mg/dL   BUN 24 (H) 6 - 20 mg/dL   Creatinine, Ser 0.91 0.44 - 1.00 mg/dL   Calcium 8.4 (L) 8.9 - 10.3 mg/dL   GFR calc non Af Amer >60 >60 mL/min   GFR calc Af Amer >60 >60 mL/min    Comment: (NOTE) The eGFR has been calculated using the CKD EPI equation. This calculation has not been validated in all clinical situations. eGFR's persistently <60 mL/min signify possible Chronic Kidney Disease.    Anion gap 12 5 - 15    Comment: Performed at [facility] [address], [place], [place] [address]  Magnesium  Status: None   Collection Time: 12/13/17  5:11 AM  Result Value Ref Range   Magnesium 2.2 1.7 - 2.4 mg/dL    Comment: Performed at [facility] [address], [place], [place] [address]  Phosphorus     Status: Abnormal   Collection Time:  12/13/17  5:11 AM  Result Value Ref Range   Phosphorus 5.1 (H) 2.5 - 4.6 mg/dL    Comment: Performed at [facility] [address], [place], [place] [address]  Triglycerides     Status: Abnormal   Collection Time: 12/13/17  5:11 AM  Result Value Ref Range   Triglycerides 205 (H) <150 mg/dL    Comment: Performed at [facility] [address], [place], [place] [address]  Glucose, capillary     Status: Abnormal   Collection Time: 12/13/17  7:11 AM  Result Value Ref Range   Glucose-Capillary 133 (H) 65 - 99 mg/dL   Comment 1 Capillary Specimen    Comment 2 Notify RN       Component Value Date/Time   SDES SPUTUM 12/12/2017 0838   SDES URINE, CATHETERIZED 12/12/2017 0838   SDES SPUTUM 12/12/2017 0838   SPECREQUEST NONE 12/12/2017 0838   SPECREQUEST NONE 12/12/2017 0838   SPECREQUEST NONE Reflexed from [id number] 12/12/2017 0838   CULT  12/12/2017 0838    NO GROWTH Performed at [facility], [place] [address] [place], [place] [address]    CULT  12/12/2017 [phone]    NO GROWTH < 24 HOURS Performed at [facility], [place] [address] [place], [place] [address]    REPTSTATUS 12/12/2017 FINAL 12/12/2017 7408   REPTSTATUS 12/13/2017 FINAL 12/12/2017 0838   REPTSTATUS PENDING 12/12/2017 0838   Dg Chest Port 1 View  Result Date: 12/13/2017 CLINICAL DATA:  Shortness of breath EXAM: PORTABLE CHEST 1 VIEW COMPARISON:  12/12/2017 FINDINGS: Cardiac shadow is stable. Endotracheal tube, left jugular central line and nasogastric catheter are again seen and stable. The lungs are well aerated bilaterally with improved aeration in the bases bilaterally. Some residual atelectatic changes are noted in the bases. No new focal abnormality is seen. IMPRESSION: Improved aeration with some residual bibasilar atelectasis. Electronically Signed   By: Inez Catalina M.D.   On: 12/13/2017 09:00   Dg Chest Port 1 View  Result Date: 12/12/2017 CLINICAL DATA:  37 year old female intubated.  Subsequent  encounter. EXAM: PORTABLE CHEST 1 VIEW COMPARISON:  12/11/2017 chest x-ray. FINDINGS: Endotracheal tube tip 3 cm above the carina. Left central line tip proximal superior vena cava level and may impress upon the lateral border of the superior vena cava. Appearance unchanged. Nasogastric tube tip and side hole curled within the gastric fundus. Slightly asymmetric airspace disease similar to prior exam and may represent pulmonary edema. Infectious process not excluded in proper clinical setting. Left base subsegmental atelectasis. No pneumothorax noted. Heart size top-normal. IMPRESSION: Endotracheal tube tip now 3 cm above the carina. Left central line tip proximal superior vena cava level and may impress upon the lateral border of the superior vena cava. Appearance unchanged. Similar appearance of asymmetric airspace disease possibly representing pulmonary edema although infectious infiltrate not excluded in the proper clinical setting. Electronically Signed   By: Genia Del M.D.   On: 12/12/2017 09:19   Recent Results (from the past 240 hour(s))  Blood culture (routine x 2)     Status: None   Collection Time: 12/06/17 10:50 PM  Result Value Ref Range Status  Specimen Description BLOOD RIGHT ARM  Final   Special Requests   Final    BOTTLES DRAWN AEROBIC AND ANAEROBIC Blood Culture adequate volume   Culture   Final    NO GROWTH 5 DAYS Performed at [facility], [address], [place], [place] [address]    Report Status 12/12/2017 FINAL  Final  Blood culture (routine x 2)     Status: None   Collection Time: 12/07/17  2:41 AM  Result Value Ref Range Status   Specimen Description BLOOD LEFT ANTECUBITAL  Final   Special Requests   Final    IN BOTH AEROBIC AND ANAEROBIC BOTTLES Blood Culture adequate volume   Culture   Final    NO GROWTH 5 DAYS Performed at [facility], [place] [address], [place], [place] [address]    Report Status 12/12/2017 FINAL  Final  Respiratory Panel by PCR      Status: Abnormal   Collection Time: 12/07/17  9:07 PM  Result Value Ref Range Status   Adenovirus NOT DETECTED NOT DETECTED Final   Coronavirus 229E NOT DETECTED NOT DETECTED Final   Coronavirus HKU1 NOT DETECTED NOT DETECTED Final   Coronavirus NL63 NOT DETECTED NOT DETECTED Final   Coronavirus OC43 NOT DETECTED NOT DETECTED Final   Metapneumovirus NOT DETECTED NOT DETECTED Final   Rhinovirus / Enterovirus DETECTED (A) NOT DETECTED Final   Influenza A NOT DETECTED NOT DETECTED Final   Influenza B NOT DETECTED NOT DETECTED Final   Parainfluenza Virus 1 NOT DETECTED NOT DETECTED Final   Parainfluenza Virus 2 NOT DETECTED NOT DETECTED Final   Parainfluenza Virus 3 NOT DETECTED NOT DETECTED Final   Parainfluenza Virus 4 NOT DETECTED NOT DETECTED Final   Respiratory Syncytial Virus NOT DETECTED NOT DETECTED Final   Bordetella pertussis NOT DETECTED NOT DETECTED Final   Chlamydophila pneumoniae NOT DETECTED NOT DETECTED Final   Mycoplasma pneumoniae NOT DETECTED NOT DETECTED Final    Comment: Performed at [facility], [place] [address], [place], [place] [address]  Group A Strep by PCR     Status: None   Collection Time: 12/07/17  9:07 PM  Result Value Ref Range Status   Group A Strep by PCR NOT DETECTED NOT DETECTED Final    Comment: Performed at [facility], [place] [address] [place], [place] [address]  Culture, respiratory (tracheal aspirate)     Status: None   Collection Time: 12/08/17  7:25 AM  Result Value Ref Range Status   Specimen Description TRACHEAL ASPIRATE  Final   Special Requests NONE  Final   Gram Stain   Final    MODERATE WBC PRESENT, PREDOMINANTLY PMN NO ORGANISMS SEEN    Culture   Final    NO GROWTH 2 DAYS Performed at [facility], [place] [address], [place], [place] [address]    Report Status 12/10/2017 FINAL  Final  MRSA PCR Screening     Status: None   Collection Time: 12/08/17  7:41 AM  Result Value Ref Range Status   MRSA by PCR NEGATIVE  NEGATIVE Final    Comment:        The GeneXpert MRSA Assay (FDA approved for NASAL specimens only), is one component of a comprehensive MRSA colonization surveillance program. It is not intended to diagnose MRSA infection nor to guide or monitor treatment for MRSA infections. Performed at [facility], [place] [address], [place],  [address]   Culture, blood (routine x  2)     Status: None (Preliminary result)   Collection Time: 12/08/17  7:47 AM  Result Value Ref Range Status   Specimen Description BLOOD RIGHT MIDLINE  Final   Special Requests   Final    BOTTLES DRAWN AEROBIC ONLY Blood Culture adequate volume   Culture   Final    NO GROWTH 4 DAYS Performed at [facility], [address], [place], [place] [address]    Report Status PENDING  Incomplete  Culture, blood (routine x 2)     Status: None (Preliminary result)   Collection Time: 12/08/17  7:58 AM  Result Value Ref Range Status   Specimen Description BLOOD RIGHT HAND  Final   Special Requests   Final    BOTTLES DRAWN AEROBIC ONLY Blood Culture adequate volume   Culture   Final    NO GROWTH 4 DAYS Performed at [facility], [place] [address], [place], [place] [address]    Report Status PENDING  Incomplete  Culture, expectorated sputum-assessment     Status: None   Collection Time: 12/12/17  8:38 AM  Result Value Ref Range Status   Specimen Description SPUTUM  Final   Special Requests NONE  Final   Sputum evaluation   Final    THIS SPECIMEN IS ACCEPTABLE FOR SPUTUM CULTURE Performed at [facility], [place] [address], [place], [place] [address]    Report Status 12/12/2017 FINAL  Final  Urine Culture     Status: None   Collection Time: 12/12/17  8:38 AM  Result Value Ref Range Status   Specimen Description URINE, CATHETERIZED  Final   Special Requests NONE  Final   Culture   Final    NO GROWTH Performed at [facility], [place] [address] [place], [place] [address]    Report Status  12/13/2017 FINAL  Final  Culture, respiratory (NON-Expectorated)     Status: None (Preliminary result)   Collection Time: 12/12/17  8:38 AM  Result Value Ref Range Status   Specimen Description SPUTUM  Final   Special Requests NONE Reflexed from [id number]  Final   Gram Stain   Final    MODERATE WBC PRESENT, PREDOMINANTLY PMN NO ORGANISMS SEEN    Culture   Final    NO GROWTH < 24 HOURS Performed at [facility], [place] [address], [place], [place] [address]    Report Status PENDING  Incomplete    Microbiology: Recent Results (from the past 240 hour(s))  Blood culture (routine x 2)     Status: None   Collection Time: 12/06/17 10:50 PM  Result Value Ref Range Status   Specimen Description BLOOD RIGHT ARM  Final   Special Requests   Final    BOTTLES DRAWN AEROBIC AND ANAEROBIC Blood Culture adequate volume   Culture   Final    NO GROWTH 5 DAYS Performed at [facility], [address], [place], [place] [address]    Report Status 12/12/2017 FINAL  Final  Blood culture (routine x 2)     Status: None   Collection Time: 12/07/17  2:41 AM  Result Value Ref Range Status   Specimen Description BLOOD LEFT ANTECUBITAL  Final   Special Requests   Final    IN BOTH AEROBIC AND ANAEROBIC BOTTLES Blood Culture adequate volume   Culture   Final    NO GROWTH 5 DAYS Performed at [facility], [place] [address], [place], [place] [address]  Report Status 12/12/2017 FINAL  Final  Respiratory Panel by PCR     Status: Abnormal   Collection Time: 12/07/17  9:07 PM  Result Value Ref Range Status   Adenovirus NOT DETECTED NOT DETECTED Final   Coronavirus 229E NOT DETECTED NOT DETECTED Final   Coronavirus HKU1 NOT DETECTED NOT DETECTED Final   Coronavirus NL63 NOT DETECTED NOT DETECTED Final   Coronavirus OC43 NOT DETECTED NOT DETECTED Final   Metapneumovirus NOT DETECTED NOT DETECTED Final   Rhinovirus / Enterovirus DETECTED (A) NOT DETECTED Final   Influenza A NOT DETECTED NOT  DETECTED Final   Influenza B NOT DETECTED NOT DETECTED Final   Parainfluenza Virus 1 NOT DETECTED NOT DETECTED Final   Parainfluenza Virus 2 NOT DETECTED NOT DETECTED Final   Parainfluenza Virus 3 NOT DETECTED NOT DETECTED Final   Parainfluenza Virus 4 NOT DETECTED NOT DETECTED Final   Respiratory Syncytial Virus NOT DETECTED NOT DETECTED Final   Bordetella pertussis NOT DETECTED NOT DETECTED Final   Chlamydophila pneumoniae NOT DETECTED NOT DETECTED Final   Mycoplasma pneumoniae NOT DETECTED NOT DETECTED Final    Comment: Performed at [facility], [place] [address], [place], [place] [address]  Group A Strep by PCR     Status: None   Collection Time: 12/07/17  9:07 PM  Result Value Ref Range Status   Group A Strep by PCR NOT DETECTED NOT DETECTED Final    Comment: Performed at [facility], [place] [address], [place], [place] [address]  Culture, respiratory (tracheal aspirate)     Status: None   Collection Time: 12/08/17  7:25 AM  Result Value Ref Range Status   Specimen Description TRACHEAL ASPIRATE  Final   Special Requests NONE  Final   Gram Stain   Final    MODERATE WBC PRESENT, PREDOMINANTLY PMN NO ORGANISMS SEEN    Culture   Final    NO GROWTH 2 DAYS Performed at [facility], [place] [address] [place], [place] [address]    Report Status 12/10/2017 FINAL  Final  MRSA PCR Screening     Status: None   Collection Time: 12/08/17  7:41 AM  Result Value Ref Range Status   MRSA by PCR NEGATIVE NEGATIVE Final    Comment:        The GeneXpert MRSA Assay (FDA approved for NASAL specimens only), is one component of a comprehensive MRSA colonization surveillance program. It is not intended to diagnose MRSA infection nor to guide or monitor treatment for MRSA infections. Performed at [facility], [place] [address] [place], [place] [address]   Culture, blood (routine x 2)     Status: None (Preliminary result)   Collection Time: 12/08/17  7:47 AM  Result Value  Ref Range Status   Specimen Description BLOOD RIGHT MIDLINE  Final   Special Requests   Final    BOTTLES DRAWN AEROBIC ONLY Blood Culture adequate volume   Culture   Final    NO GROWTH 4 DAYS Performed at [facility], [place] [address], [place], [place] [address]    Report Status PENDING  Incomplete  Culture, blood (routine x 2)     Status: None (Preliminary result)   Collection Time: 12/08/17  7:58 AM  Result Value Ref Range Status   Specimen Description BLOOD RIGHT HAND  Final   Special Requests   Final    BOTTLES DRAWN AEROBIC ONLY Blood Culture adequate volume   Culture   Final  NO GROWTH 4 DAYS Performed at [facility], [place] [address], [place], [place] [address]    Report Status PENDING  Incomplete  Culture, expectorated sputum-assessment     Status: None   Collection Time: 12/12/17  8:38 AM  Result Value Ref Range Status   Specimen Description SPUTUM  Final   Special Requests NONE  Final   Sputum evaluation   Final    THIS SPECIMEN IS ACCEPTABLE FOR SPUTUM CULTURE Performed at [facility], [place] [address], [place], [place] [address]    Report Status 12/12/2017 FINAL  Final  Urine Culture     Status: None   Collection Time: 12/12/17  8:38 AM  Result Value Ref Range Status   Specimen Description URINE, CATHETERIZED  Final   Special Requests NONE  Final   Culture   Final    NO GROWTH Performed at [facility], [place] [address], [place], [place] [address]    Report Status 12/13/2017 FINAL  Final  Culture, respiratory (NON-Expectorated)     Status: None (Preliminary result)   Collection Time: 12/12/17  8:38 AM  Result Value Ref Range Status   Specimen Description SPUTUM  Final   Special Requests NONE Reflexed from [id number]  Final   Gram Stain   Final    MODERATE WBC PRESENT, PREDOMINANTLY PMN NO ORGANISMS SEEN    Culture   Final    NO GROWTH < 24 HOURS Performed at [facility], [place] [address] [place],  [address]    Report  Status PENDING  Incomplete    Radiographs and labs were personally reviewed by me.   Bobby Rumpf, MD [facility] [phone] 12/13/2017, 9:08 AM

## 2017-12-13 NOTE — Progress Notes (Signed)
Pts SBP has been in low to mid 80s w/ MAP in high 50s-60 for past few hours. Have titrated both precedex and fentanyl down w/ no change. CVP: 5. Per Dr. Nelda Marseille, continue to titrate sedation down for now.

## 2017-12-13 NOTE — Progress Notes (Signed)
Pt self extubated & was placed on a NRB. Pt's vitals are stable. Dr. Nelda Marseille has been notified. Sp02 100%.

## 2017-12-13 NOTE — Progress Notes (Signed)
PULMONARY / CRITICAL CARE MEDICINE   Name: [name] MRN: [id number] DOB: 26-Dec-1980    ADMISSION DATE:  12/06/2017 CONSULTATION DATE:  12/08/17  REFERRING MD:  Lonny Prude  CHIEF COMPLAINT:  Dyspnea  BRIEF HISTORY OF PRESENT ILLNESS:   37 y/o female with crohn's, EtOH abuse, chronic narcotic use admitted with cough, chest pain dyspnea.  Developed ARDS either from aspiration pneumonitis or rhinovirus.  SUBJECTIVE:  Very agitated overnight, required reinitiation of IV drip sedation  VITAL SIGNS: BP 107/73   Pulse 73   Temp 98.2 F (36.8 C) (Rectal)   Resp 19   Ht 5' 2"  (1.575 m)   Wt 147 lb 9.6 oz (67 kg)   LMP 12/03/2017 (Exact Date)   SpO2 100%   BMI 27.00 kg/m   HEMODYNAMICS:    VENTILATOR SETTINGS: Vent Mode: PRVC FiO2 (%):  [50 %] 50 % Set Rate:  [24 bmp] 24 bmp Vt Set:  [400 mL] 400 mL PEEP:  [8 cmH20] 8 cmH20 Plateau Pressure:  [15 cmH20-21 cmH20] 15 cmH20  INTAKE / OUTPUT: I/O last 3 completed shifts: In: 5614.7 [I.V.:1633.2; NG/GT:3281.5; IV Piggyback:700] Out: 3151 [Urine:6740]  PHYSICAL EXAMINATION:  General:  Sedated, not paralyzed, but arousable and following command HENT: [place]/AT, PERRL, EOM-I and MMM PULM: Coarse BS diffusely CV: RRR, Nl S1/S2 and -M/R/G GI: Soft, NT, ND and +BS MSK: Diminished bulk and tone Neuro: Sedate but arousable but moving all ext to command  LABS:  BMET Recent Labs  Lab 12/11/17 1928 12/12/17 0351 12/13/17 0511  NA 146* 147* 146*  K 3.8 3.7 3.6  CL 103 101 100*  CO2 35* 35* 34*  BUN 15 14 24*  CREATININE 0.93 0.97 0.91  GLUCOSE 131* 151* 153*   Electrolytes Recent Labs  Lab 12/09/17 1740  12/11/17 1928 12/12/17 0351 12/13/17 0511  CALCIUM  --    < > 6.5* 7.2* 8.4*  MG 2.1  --   --  2.1 2.2  PHOS 3.7  --   --  2.6 5.1*   < > = values in this interval not displayed.   CBC Recent Labs  Lab 12/11/17 0350 12/12/17 0351 12/13/17 0511  WBC 22.2* 21.6* 25.2*  HGB 8.5* 9.5* 9.4*  HCT 27.5* 31.7*  32.7*  PLT 356 454* PENDING   Coag's Recent Labs  Lab 12/08/17 0747  INR 1.35   Sepsis Markers Recent Labs  Lab 12/07/17 0445  12/07/17 1218 12/08/17 0747 12/08/17 0748 12/08/17 0952  LATICACIDVEN  --    < > 1.6  --  1.1 0.6  PROCALCITON 88.50  --   --  32.13  --   --    < > = values in this interval not displayed.   ABG Recent Labs  Lab 12/11/17 0943 12/12/17 0230 12/13/17 0249  PHART 7.520* 7.476* 7.464*  PCO2ART 48.3* 54.6* 53.6*  PO2ART 78.0* 95.0 114.0*   Liver Enzymes Recent Labs  Lab 12/06/17 1957 12/07/17 1047 12/08/17 0747  AST 14* 20 23  ALT 8* QUANTITY NOT SUFFICIENT, UNABLE TO PERFORM TEST 12*  ALKPHOS 155* 168* 218*  BILITOT 1.0 QUANTITY NOT SUFFICIENT, UNABLE TO PERFORM TEST 0.4  ALBUMIN 2.2* 2.4* 1.8*   Cardiac Enzymes Recent Labs  Lab 12/08/17 0747 12/08/17 1106  TROPONINI 0.31* <0.03   Glucose Recent Labs  Lab 12/12/17 1148 12/12/17 1612 12/12/17 2015 12/12/17 2347 12/13/17 0330 12/13/17 0711  GLUCAP 136* 127* 162* 164* 165* 133*   Imaging No results found. STUDIES:  5/2 ct abd> no acute  process, bowel looks OK, mild gallbladder distention 5/2 CT sinus> generalized acute sinusitis with fluid levels, possible otomastoiditis 5/3 echo > LVEF 65-70, PA pressure 42 mm Hg  CULTURES: 5/1 blood >NTD  5/3 blood > NTD 5/3 resp > NTD 5/7 Blood>>> 5/7 Urine>>> 5/7 Sputum>>> 5/2 resp viral panel > rhinovirus 5/2 rapid strep throat swab > negative  ANTIBIOTICS: 5/1 ceftriaxone > 5/2 5/2 azithro > 5/2 5/3 vanc > 5/4>>>5/7 5/3 Mero > 5/4 >>>5/7 5/4 ceftriaxone > 5/7  SIGNIFICANT EVENTS: 5/3 move to ICU, intubated, paralyzed, prone  LINES/TUBES: 5/3 ETT >  5/3 L IJ CVL >  5/3 R radial arterial line >   DISCUSSION: 37 y/o female with crohns, etoh abuse, chronic narcotic use (NO IVDU, chart incorrect) who has ARDS from either rhinovirus or aspiration pneumonitis.  Oxygenation improving slowly but agitation remains a major  barrier  ASSESSMENT / PLAN:  PULMONARY A: ARDS > improving but still has severe acute lung disease requiring high vent suppot 5/5 Severe vent dissynchrony P:   D/C nimbex of the MAR Begin PS trials Diurese aiming for CVP < 4, d/c lasix but start diamox address acid/base CXR portable in AM Titrate O2 for sat of 88-92%  CARDIOVASCULAR A:  Mild sinus tachycardia per fever P:  Tele Monitor hemodynamics Maintain a-line in place  RENAL A:   Hypokalemia and hypocalcemia Hypernatremia P:   Monitor BMET and UOP Replace electrolytes as needed Increase free water 300 q4 Acetazolamide 250 mg IV q6 x3 doses  GASTROINTESTINAL A:   Crohns, history of abdominal surgery but per mother not on any chronic medications for this Chronic diarrhea, now with no bowel movements P:   Continue tube feeding Continue pepcid D/C protonix Continue colace D/C MOM and [place]  HEMATOLOGIC A:   Anemia of chronic disease, no bleeding P:  Monitor for bleeding  Transfuse if Hgb < 7gm/dL CBC in AM  INFECTIOUS A:   Severe CAP > from rhinovirus Aspiration pneumonitis possible Sinusitis/possible mastoiditis per CT scan Fever 5/5 due to the above, overall clinically improving despite this so doubt new infection P:   D/C ceftriaxone Reculture today Merrem and vanc as ordered Will ask ID to evaluate, no longer sure about source of fever  ENDOCRINE A:   Mild Hyperglycemia P:   SSI CBGs  NEUROLOGIC A:   Need for heavy sedation in setting of severe ARDS Narcotic dependence > does not use IV drugs EtOH use Vent dyssynchrony when weaning Nimbex P:   Hold fentanyl drip, if not extubatable then may restart Precedex drip as ordered Thiamine/folate D/C versed  FAMILY  - Updates: No family bedside 5/8.  - Inter-disciplinary family meet or Palliative Care meeting due by:  day 7  The patient is critically ill with multiple organ systems failure and requires high complexity decision making  for assessment and support, frequent evaluation and titration of therapies, application of advanced monitoring technologies and extensive interpretation of multiple databases.   Critical Care Time devoted to patient care services described in this note is  38  Minutes. This time reflects time of care of this signee Dr Jennet Maduro. This critical care time does not reflect procedure time, or teaching time or supervisory time of PA/NP/Med student/Med Resident etc but could involve care discussion time.  Rush Farmer, M.D. [facility] Pulmonary/Critical Care Medicine. Pager: [phone]. After hours pager: [phone].  12/13/2017, 8:19 AM

## 2017-12-13 NOTE — Procedures (Signed)
OGT Placement By MD  OGT placed under direct laryngoscopy and verified by auscultation.  Rush Farmer, M.D. [facility] Pulmonary/Critical Care Medicine. Pager: [phone]. After hours pager: [phone].

## 2017-12-14 ENCOUNTER — Inpatient Hospital Stay (HOSPITAL_COMMUNITY): Payer: Medicaid Other

## 2017-12-14 DIAGNOSIS — F1910 Other psychoactive substance abuse, uncomplicated: Secondary | ICD-10-CM

## 2017-12-14 LAB — BASIC METABOLIC PANEL
Anion gap: 7 (ref 5–15)
BUN: 20 mg/dL (ref 6–20)
CO2: 26 mmol/L (ref 22–32)
Calcium: 8.6 mg/dL — ABNORMAL LOW (ref 8.9–10.3)
Chloride: 110 mmol/L (ref 101–111)
Creatinine, Ser: 0.72 mg/dL (ref 0.44–1.00)
GFR calc Af Amer: >60 mL/min (ref >60)
GFR calc non Af Amer: >60 mL/min (ref >60)
Glucose, Bld: 143 mg/dL — ABNORMAL HIGH (ref 65–99)
Potassium: 4.4 mmol/L (ref 3.5–5.1)
Sodium: 143 mmol/L (ref 135–145)

## 2017-12-14 LAB — POCT I-STAT 3, ART BLOOD GAS (G3+)
Acid-base deficit: 1 mmol/L (ref 0.0–2.0)
Bicarbonate: 25.2 mmol/L (ref 20.0–28.0)
O2 Saturation: 99 %
TCO2: 27 mmol/L (ref 22–32)
pCO2 arterial: 45.9 mmHg (ref 32.0–48.0)
pH, Arterial: 7.347 — ABNORMAL LOW (ref 7.350–7.450)
pO2, Arterial: 129 mmHg — ABNORMAL HIGH (ref 83.0–108.0)

## 2017-12-14 LAB — CBC
HCT: 28.9 % — ABNORMAL LOW (ref 36.0–46.0)
Hemoglobin: 8.2 g/dL — ABNORMAL LOW (ref 12.0–15.0)
MCH: 23.9 pg — ABNORMAL LOW (ref 26.0–34.0)
MCHC: 28.4 g/dL — ABNORMAL LOW (ref 30.0–36.0)
MCV: 84.3 fL (ref 78.0–100.0)
Platelets: 513 10*3/uL — ABNORMAL HIGH (ref 150–400)
RBC: 3.43 MIL/uL — ABNORMAL LOW (ref 3.87–5.11)
RDW: 18.8 % — ABNORMAL HIGH (ref 11.5–15.5)
WBC: 21.1 10*3/uL — ABNORMAL HIGH (ref 4.0–10.5)

## 2017-12-14 LAB — GLUCOSE, CAPILLARY
Glucose-Capillary: 140 mg/dL — ABNORMAL HIGH (ref 65–99)
Glucose-Capillary: 135 mg/dL — ABNORMAL HIGH (ref 65–99)
Glucose-Capillary: 103 mg/dL — ABNORMAL HIGH (ref 65–99)
Glucose-Capillary: 108 mg/dL — ABNORMAL HIGH (ref 65–99)

## 2017-12-14 LAB — HEPATITIS PANEL, ACUTE
HCV Ab: <0.1 s/co ratio (ref 0.0–0.9)
Hep A IgM: NEGATIVE
Hep B C IgM: NEGATIVE
Hepatitis B Surface Ag: NEGATIVE

## 2017-12-14 LAB — CULTURE, RESPIRATORY W GRAM STAIN: Culture: NO GROWTH

## 2017-12-14 LAB — PHOSPHORUS: Phosphorus: 4.1 mg/dL (ref 2.5–4.6)

## 2017-12-14 LAB — MAGNESIUM: Magnesium: 2.3 mg/dL (ref 1.7–2.4)

## 2017-12-14 LAB — CULTURE, RESPIRATORY

## 2017-12-14 IMAGING — DX DG ABD PORTABLE 1V
1 series · 1 of 1 positions shown · non-contrast
Comparison: [DATE]

CLINICAL DATA: NG tube placement

EXAM:
PORTABLE ABDOMEN - 1 VIEW

[abdomen kub]
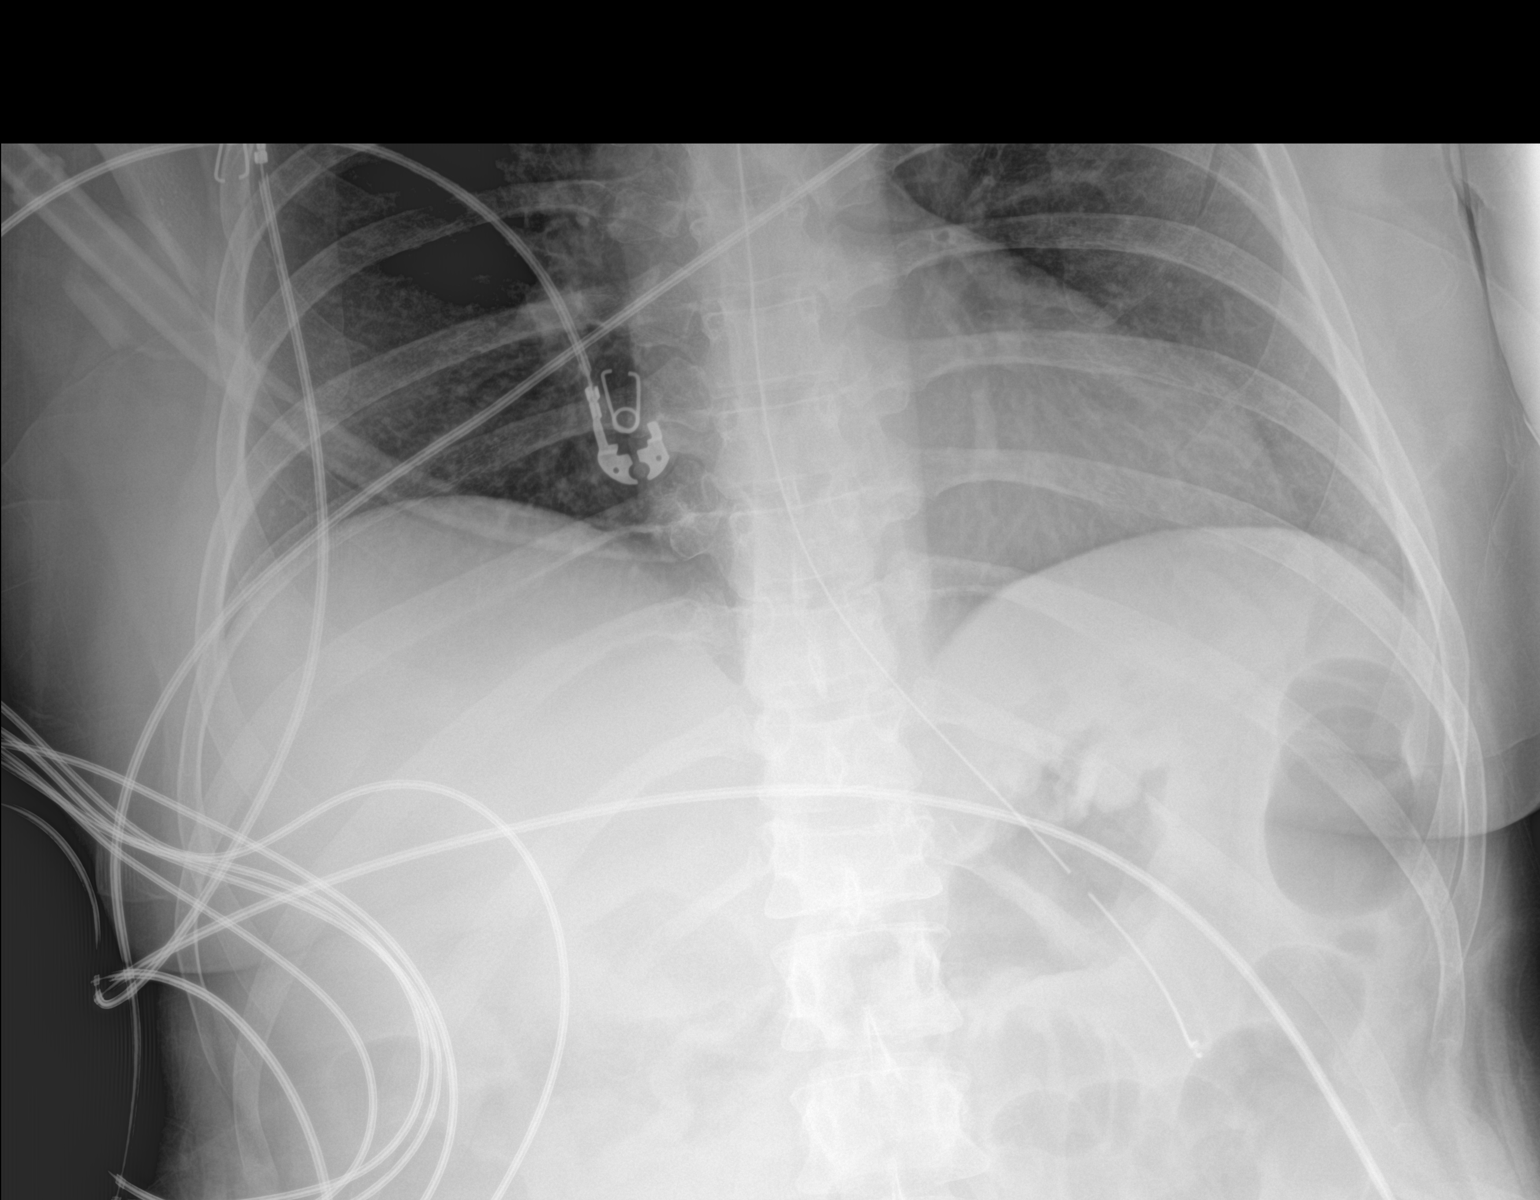

[1 of 1 positions shown; findings below may reference images not displayed]

FINDINGS: Enteric tube terminates in the proximal gastric body.

Lung bases are clear.
IMPRESSION: Enteric tube terminates in the proximal gastric body.

## 2017-12-14 IMAGING — DX DG CHEST 1V PORT
1 series · 1 of 1 positions shown · non-contrast
Comparison: [DATE]

CLINICAL DATA: Check endotracheal tube placement

EXAM:
PORTABLE CHEST 1 VIEW

[chest ap]
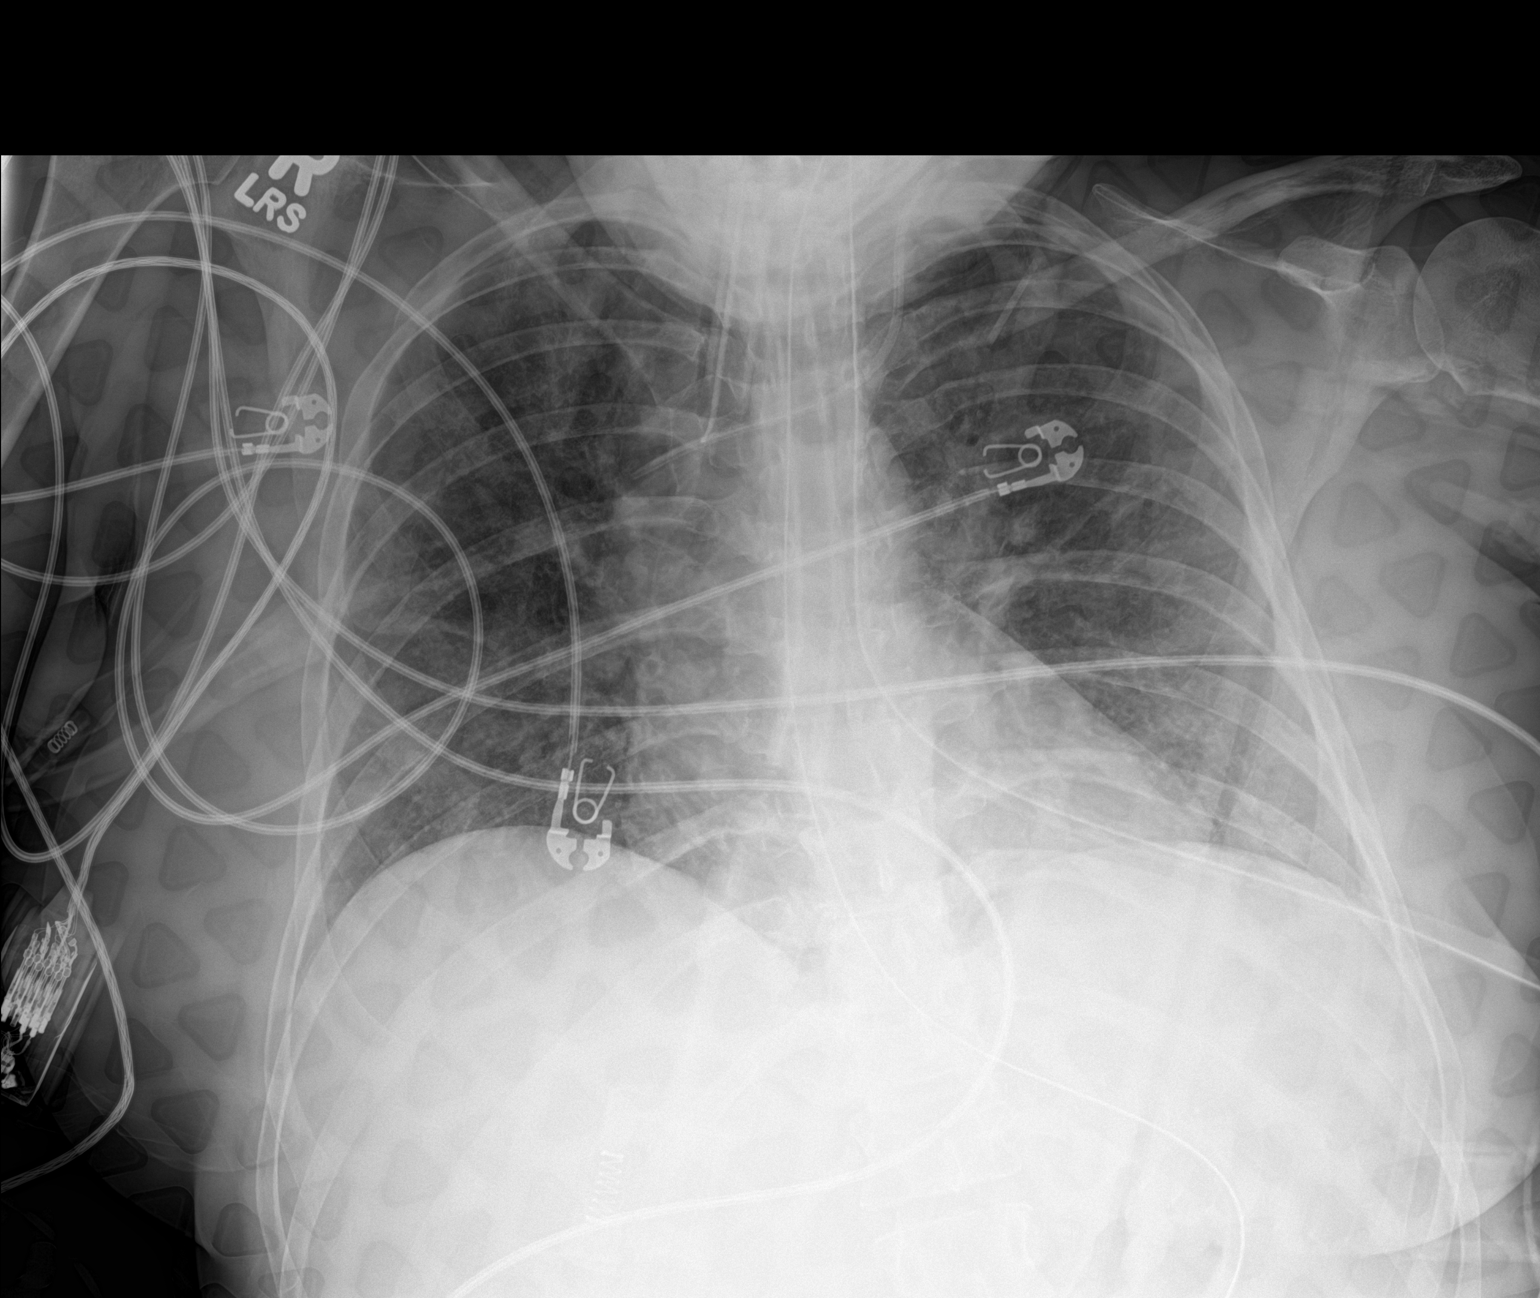

[1 of 1 positions shown; findings below may reference images not displayed]

FINDINGS: Cardiac shadow is stable. Endotracheal tube and nasogastric catheter
are noted in satisfactory position. Lungs are well aerated
bilaterally. Considerable overlying artifact is seen. The vascular
congestion seen previously has improved slightly in the interval
from the prior exam.
IMPRESSION: Mild improved vascular congestion. No new focal abnormality is
noted.

## 2017-12-14 MED ORDER — CEFTRIAXONE SODIUM 1 G IJ SOLR
1.0000 g | INTRAMUSCULAR | Status: AC
  Administered 2017-12-14 – 2017-12-19 (×6): 1 g via INTRAVENOUS
  Filled 2017-12-14 (×6): qty 10

## 2017-12-14 MED ORDER — METRONIDAZOLE IN NACL 5-0.79 MG/ML-% IV SOLN
500.0000 mg | Freq: Three times a day (TID) | INTRAVENOUS | Status: DC
  Filled 2017-12-14: qty 100

## 2017-12-14 MED ORDER — ALUM & MAG HYDROXIDE-SIMETH 200-200-20 MG/5ML PO SUSP
15.0000 mL | Freq: Four times a day (QID) | ORAL | Status: DC | PRN
  Filled 2017-12-14: qty 30

## 2017-12-14 MED ORDER — LORAZEPAM 2 MG/ML IJ SOLN
INTRAMUSCULAR | Status: AC
  Filled 2017-12-14: qty 1

## 2017-12-14 MED ORDER — FAMOTIDINE IN NACL 20-0.9 MG/50ML-% IV SOLN
20.0000 mg | Freq: Two times a day (BID) | INTRAVENOUS | Status: DC
  Administered 2017-12-14: 20 mg via INTRAVENOUS
  Filled 2017-12-14 (×2): qty 50

## 2017-12-14 MED ORDER — LORAZEPAM 2 MG/ML IJ SOLN
1.0000 mg | INTRAMUSCULAR | Status: DC | PRN
  Administered 2017-12-14 – 2017-12-18 (×4): 1 mg via INTRAVENOUS
  Filled 2017-12-14 (×3): qty 1

## 2017-12-14 NOTE — Progress Notes (Signed)
INFECTIOUS DISEASE PROGRESS NOTE  ID: [name] is a 37 y.o. female with  Principal Problem:   Bronchitis Active Problems:   Anxiety state   Essential hypertension   GERD   CROHN'S DISEASE-SMALL INTESTINE   Abdominal pain, generalized   Hypokalemia   Hypocalcemia   Hypomagnesemia   Sepsis ([place])   AKI (acute kidney injury) ([place])   Acute sinusitis   Acute respiratory failure with hypoxia (HCC)   Acute respiratory failure with hypoxemia (H[facility])  Subjective: Agitated, wants tube out.   Abtx:  Anti-infectives (From admission, onward)   Start     Dose/Rate Route Frequency Ordered Stop   12/14/17 1500  cefTRIAXone (ROCEPHIN) 1 g in sodium chloride 0.9 % 100 mL IVPB     1 g 200 mL/hr over 30 Minutes Intravenous Every 24 hours 12/14/17 0935     12/14/17 1500  metroNIDAZOLE (FLAGYL) IVPB 500 mg  Status:  Discontinued     500 mg 100 mL/hr over 60 Minutes Intravenous Every 8 hours 12/14/17 0935 12/14/17 0954   12/14/17 1500  metroNIDAZOLE (FLAGYL) IVPB 500 mg     500 mg 100 mL/hr over 60 Minutes Intravenous Every 8 hours 12/14/17 1010     12/12/17 2300  vancomycin (VANCOCIN) IVPB 750 mg/150 ml premix  Status:  Discontinued     750 mg 150 mL/hr over 60 Minutes Intravenous Every 12 hours 12/12/17 0846 12/14/17 0930   12/12/17 1100  meropenem (MERREM) 1 g in sodium chloride 0.9 % 100 mL IVPB  Status:  Discontinued     1 g 200 mL/hr over 30 Minutes Intravenous Every 12 hours 12/12/17 0840 12/14/17 0930   12/12/17 1000  vancomycin (VANCOCIN) 1,500 mg in sodium chloride 0.9 % 500 mL IVPB     1,500 mg 250 mL/hr over 120 Minutes Intravenous  Once 12/12/17 0840 12/12/17 1249   12/09/17 0900  cefTRIAXone (ROCEPHIN) 1 g in sodium chloride 0.9 % 100 mL IVPB  Status:  Discontinued    Note to Pharmacy:  Please adjust dosing as appropriate for indication   1 g 200 mL/hr over 30 Minutes Intravenous Every 24 hours 12/09/17 0824 12/12/17 0840   12/08/17 2200  vancomycin (VANCOCIN) IVPB 750  mg/150 ml premix  Status:  Discontinued     750 mg 150 mL/hr over 60 Minutes Intravenous Every 12 hours 12/08/17 0940 12/09/17 0809   12/08/17 0900  meropenem (MERREM) 1 g in sodium chloride 0.9 % 100 mL IVPB  Status:  Discontinued     1 g 200 mL/hr over 30 Minutes Intravenous Every 8 hours 12/08/17 0812 12/09/17 0809   12/08/17 0815  vancomycin (VANCOCIN) 1,250 mg in sodium chloride 0.9 % 250 mL IVPB     1,250 mg 166.7 mL/hr over 90 Minutes Intravenous  Once 12/08/17 0812 12/08/17 1053   12/07/17 2200  cefTRIAXone (ROCEPHIN) 1 g in sodium chloride 0.9 % 100 mL IVPB  Status:  Discontinued     1 g 200 mL/hr over 30 Minutes Intravenous Every 24 hours 12/07/17 0148 12/08/17 0737   12/07/17 2200  azithromycin (ZITHROMAX) 500 mg in sodium chloride 0.9 % 250 mL IVPB  Status:  Discontinued     500 mg 250 mL/hr over 60 Minutes Intravenous Every 24 hours 12/07/17 0148 12/08/17 0740   12/06/17 2315  cefTRIAXone (ROCEPHIN) 1 g in sodium chloride 0.9 % 100 mL IVPB     1 g 200 mL/hr over 30 Minutes Intravenous  Once 12/06/17 2310 12/06/17 2358   12/06/17  2315  azithromycin (ZITHROMAX) 500 mg in sodium chloride 0.9 % 250 mL IVPB     500 mg 250 mL/hr over 60 Minutes Intravenous  Once 12/06/17 2310 12/07/17 0100      Medications:  Scheduled: . arformoterol  15 mcg Nebulization BID  . budesonide (PULMICORT) nebulizer solution  0.5 mg Nebulization BID  . chlorhexidine gluconate (MEDLINE KIT)  15 mL Mouth Rinse BID  . Chlorhexidine Gluconate Cloth  6 each Topical Daily  . famotidine  20 mg Per Tube BID  . folic acid  1 mg Oral Daily  . free water  300 mL Per Tube Q6H  . heparin  5,000 Units Subcutaneous Q8H  . insulin aspart  1-3 Units Subcutaneous Q4H  . mouth rinse  15 mL Mouth Rinse QID  . sodium chloride flush  10-40 mL Intracatheter Q12H  . thiamine  100 mg Per Tube Daily    Objective: Vital signs in last 24 hours: Temp:  [98 F (36.7 C)-99.4 F (37.4 C)] 98.2 F (36.8 C) (05/09  0727) Pulse Rate:  [60-118] 73 (05/09 0900) Resp:  [12-32] 22 (05/09 0900) BP: (78-195)/(52-132) 92/70 (05/09 0900) SpO2:  [91 %-100 %] 100 % (05/09 0900) Arterial Line BP: (67-181)/(45-105) 118/68 (05/09 0900) FiO2 (%):  [40 %-50 %] 40 % (05/09 0718) Weight:  [66.9 kg (147 lb 7.8 oz)] 66.9 kg (147 lb 7.8 oz) (05/09 0345)   General appearance: alert and mild distress Resp: clear to auscultation bilaterally Cardio: regular rate and rhythm GI: normal findings: bowel sounds normal and soft, non-tender Extremities: edema none  Lab Results Recent Labs    12/13/17 0511 12/14/17 0335  WBC 25.2* 21.1*  HGB 9.4* 8.2*  HCT 32.7* 28.9*  NA 146* 143  K 3.6 4.4  CL 100* 110  CO2 34* 26  BUN 24* 20  CREATININE 0.91 0.72   Liver Panel No results for input(s): PROT, ALBUMIN, AST, ALT, ALKPHOS, BILITOT, BILIDIR, IBILI in the last 72 hours. Sedimentation Rate No results for input(s): ESRSEDRATE in the last 72 hours. C-Reactive Protein No results for input(s): CRP in the last 72 hours.  Microbiology: Recent Results (from the past 240 hour(s))  Blood culture (routine x 2)     Status: None   Collection Time: 12/06/17 10:50 PM  Result Value Ref Range Status   Specimen Description BLOOD RIGHT ARM  Final   Special Requests   Final    BOTTLES DRAWN AEROBIC AND ANAEROBIC Blood Culture adequate volume   Culture   Final    NO GROWTH 5 DAYS Performed at [facility], [address] [place], [place] [address]    Report Status 12/12/2017 FINAL  Final  Blood culture (routine x 2)     Status: None   Collection Time: 12/07/17  2:41 AM  Result Value Ref Range Status   Specimen Description BLOOD LEFT ANTECUBITAL  Final   Special Requests   Final    IN BOTH AEROBIC AND ANAEROBIC BOTTLES Blood Culture adequate volume   Culture   Final    NO GROWTH 5 DAYS Performed at [facility], [place] [address], [place], [place] [address]    Report Status 12/12/2017 FINAL  Final  Respiratory  Panel by PCR     Status: Abnormal   Collection Time: 12/07/17  9:07 PM  Result Value Ref Range Status   Adenovirus NOT DETECTED NOT DETECTED Final   Coronavirus 229E NOT DETECTED NOT DETECTED Final   Coronavirus HKU1 NOT DETECTED NOT  DETECTED Final   Coronavirus NL63 NOT DETECTED NOT DETECTED Final   Coronavirus OC43 NOT DETECTED NOT DETECTED Final   Metapneumovirus NOT DETECTED NOT DETECTED Final   Rhinovirus / Enterovirus DETECTED (A) NOT DETECTED Final   Influenza A NOT DETECTED NOT DETECTED Final   Influenza B NOT DETECTED NOT DETECTED Final   Parainfluenza Virus 1 NOT DETECTED NOT DETECTED Final   Parainfluenza Virus 2 NOT DETECTED NOT DETECTED Final   Parainfluenza Virus 3 NOT DETECTED NOT DETECTED Final   Parainfluenza Virus 4 NOT DETECTED NOT DETECTED Final   Respiratory Syncytial Virus NOT DETECTED NOT DETECTED Final   Bordetella pertussis NOT DETECTED NOT DETECTED Final   Chlamydophila pneumoniae NOT DETECTED NOT DETECTED Final   Mycoplasma pneumoniae NOT DETECTED NOT DETECTED Final    Comment: Performed at [facility], [place] [address] [place], [place] [address]  Group A Strep by PCR     Status: None   Collection Time: 12/07/17  9:07 PM  Result Value Ref Range Status   Group A Strep by PCR NOT DETECTED NOT DETECTED Final    Comment: Performed at [facility], [place] [address], [place], [place] [address]  Culture, respiratory (tracheal aspirate)     Status: None   Collection Time: 12/08/17  7:25 AM  Result Value Ref Range Status   Specimen Description TRACHEAL ASPIRATE  Final   Special Requests NONE  Final   Gram Stain   Final    MODERATE WBC PRESENT, PREDOMINANTLY PMN NO ORGANISMS SEEN    Culture   Final    NO GROWTH 2 DAYS Performed at [facility], [place] [address], [place], [place] [address]    Report Status 12/10/2017 FINAL  Final  MRSA PCR Screening     Status: None   Collection Time: 12/08/17  7:41 AM  Result Value Ref Range Status   MRSA by  PCR NEGATIVE NEGATIVE Final    Comment:        The GeneXpert MRSA Assay (FDA approved for NASAL specimens only), is one component of a comprehensive MRSA colonization surveillance program. It is not intended to diagnose MRSA infection nor to guide or monitor treatment for MRSA infections. Performed at [facility], [place] [address], [place], [place] [address]   Culture, blood (routine x 2)     Status: None   Collection Time: 12/08/17  7:47 AM  Result Value Ref Range Status   Specimen Description BLOOD RIGHT MIDLINE  Final   Special Requests   Final    BOTTLES DRAWN AEROBIC ONLY Blood Culture adequate volume   Culture   Final    NO GROWTH 5 DAYS Performed at [facility], [address] [place], [place] [address]    Report Status 12/13/2017 FINAL  Final  Culture, blood (routine x 2)     Status: None   Collection Time: 12/08/17  7:58 AM  Result Value Ref Range Status   Specimen Description BLOOD RIGHT HAND  Final   Special Requests   Final    BOTTLES DRAWN AEROBIC ONLY Blood Culture adequate volume   Culture   Final    NO GROWTH 5 DAYS Performed at [facility], [place] [address], [place],  [address]    Report Status 12/13/2017 FINAL  Final  Culture, expectorated sputum-assessment     Status: None   Collection Time: 12/12/17  8:38 AM  Result Value Ref Range Status   Specimen Description SPUTUM  Final   Special Requests NONE  Final   Sputum evaluation   Final    THIS SPECIMEN IS ACCEPTABLE FOR SPUTUM CULTURE Performed at [facility], [address] [place], [place] [address]    Report Status 12/12/2017 FINAL  Final  Urine Culture     Status: None   Collection Time: 12/12/17  8:38 AM  Result Value Ref Range Status   Specimen Description URINE, CATHETERIZED  Final   Special Requests NONE  Final   Culture   Final    NO GROWTH Performed at [facility], [place] [address], [place], [place] [address]    Report Status 12/13/2017 FINAL  Final   Culture, respiratory (NON-Expectorated)     Status: None (Preliminary result)   Collection Time: 12/12/17  8:38 AM  Result Value Ref Range Status   Specimen Description SPUTUM  Final   Special Requests NONE Reflexed from [id number]  Final   Gram Stain   Final    MODERATE WBC PRESENT, PREDOMINANTLY PMN NO ORGANISMS SEEN    Culture   Final    NO GROWTH 2 DAYS Performed at [facility], [place] [address], [place], [place] [address]    Report Status PENDING  Incomplete  Culture, blood (Routine X 2) w Reflex to ID Panel     Status: None (Preliminary result)   Collection Time: 12/12/17  9:09 AM  Result Value Ref Range Status   Specimen Description BLOOD LEFT ANTECUBITAL  Final   Special Requests   Final    BOTTLES DRAWN AEROBIC ONLY Blood Culture adequate volume   Culture   Final    NO GROWTH < 24 HOURS Performed at [facility], [place] [address], [place], [place] [address]    Report Status PENDING  Incomplete  Culture, blood (Routine X 2) w Reflex to ID Panel     Status: None (Preliminary result)   Collection Time: 12/12/17  9:10 AM  Result Value Ref Range Status   Specimen Description BLOOD BLOOD RIGHT FOREARM  Final   Special Requests   Final    BOTTLES DRAWN AEROBIC ONLY Blood Culture adequate volume   Culture   Final    NO GROWTH < 24 HOURS Performed at [facility], [place] [address], [place], [place] [address]    Report Status PENDING  Incomplete    Studies/Results: Dg Chest Port 1 View  Result Date: 12/13/2017 CLINICAL DATA:  37 year old female-evaluate endotracheal tube placement. EXAM: PORTABLE CHEST 1 VIEW COMPARISON:  12/13/2017 and prior radiographs FINDINGS: Cardiomediastinal silhouette is unchanged. Pulmonary vascular congestion again noted. Endotracheal tube is identified with tip extending into the RIGHT mainstem bronchus-recommend 3 cm retraction. NG tube enters stomach and LEFT IJ central venous catheter again noted with tip overlying the UPPER SVC. No  other changes identified. There is no evidence of pneumothorax. IMPRESSION: 1. Endotracheal tube with tip extending into the RIGHT mainstem bronchus-recommend 3 cm retraction. No other significant change. Apolonio Schneiders, nurse for this patient, was notified of these results on 12/13/2017 at 1:05 p.m. Electronically Signed   By: Margarette Canada M.D.   On: 12/13/2017 13:11   Dg Chest Port 1 View  Result Date: 12/13/2017 CLINICAL DATA:  Shortness of breath EXAM: PORTABLE CHEST 1 VIEW COMPARISON:  12/12/2017 FINDINGS: Cardiac shadow is stable. Endotracheal tube, left jugular central line and nasogastric catheter are again seen and stable. The lungs are well aerated bilaterally with improved aeration in the bases bilaterally. Some residual atelectatic  changes are noted in the bases. No new focal abnormality is seen. IMPRESSION: Improved aeration with some residual bibasilar atelectasis. Electronically Signed   By: Inez Catalina M.D.   On: 12/13/2017 09:00     Assessment/Plan: Aspiration PNA Narcotic abuse   Total days of antibiotics: 8 azithro--> ceftriaxone--> merrem/vanco --> ceftriaxone (5-9)  Her fever is better She is being extubated this AM Agree with ceftriaxone alone as she has already defervesced.  Will f/u in AM.            Bobby Rumpf MD, FACP Infectious Diseases (pager) [phone] [email or web address] 12/14/2017, 10:13 AM  LOS: 7 days

## 2017-12-14 NOTE — Progress Notes (Signed)
PULMONARY / CRITICAL CARE MEDICINE   Name: [name] MRN: [id number] DOB: [birth year]    ADMISSION DATE:  12/06/2017 CONSULTATION DATE:  12/08/17  REFERRING MD:  Lonny Prude  CHIEF COMPLAINT:  Dyspnea  BRIEF HISTORY OF PRESENT ILLNESS:   37 y/o female with crohn's, EtOH abuse, chronic narcotic use admitted with cough, chest pain dyspnea.  Developed ARDS either from aspiration pneumonitis or rhinovirus.  SUBJECTIVE:  No events overnight, no new complaints  VITAL SIGNS: BP 92/70   Pulse 73   Temp 98.2 F (36.8 C) (Rectal)   Resp (!) 22   Ht 5' 2"  (1.575 m)   Wt 147 lb 7.8 oz (66.9 kg)   LMP 12/03/2017 (Exact Date)   SpO2 100%   BMI 26.98 kg/m   HEMODYNAMICS: CVP:  [5 mmHg] 5 mmHg  VENTILATOR SETTINGS: Vent Mode: PSV;CPAP FiO2 (%):  [40 %-50 %] 40 % Set Rate:  [24 bmp] 24 bmp Vt Set:  [400 mL] 400 mL PEEP:  [5 cmH20] 5 cmH20 Pressure Support:  [5 cmH20] 5 cmH20 Plateau Pressure:  [13 cmH20-20 cmH20] 13 cmH20  INTAKE / OUTPUT: I/O last 3 completed shifts: In: 5569.3 [I.V.:1691; NG/GT:3128.3; IV Piggyback:750] Out: 3100 [Urine:3050; Emesis/NG output:50]  PHYSICAL EXAMINATION:  General:  Sedated but arousable and following commands HENT: [place]/AT, PERRL, EOM-I and MMM PULM: Coarse BS diffusely CV: RRR, Nl S1/S2 and -M/R/G GI: Soft, NT, ND and +BS MSK: Diminished bulk and tone Neuro: awake and interactive, moving all ext to commands  LABS:  BMET Recent Labs  Lab 12/12/17 0351 12/13/17 0511 12/14/17 0335  NA 147* 146* 143  K 3.7 3.6 4.4  CL 101 100* 110  CO2 35* 34* 26  BUN 14 24* 20  CREATININE 0.97 0.91 0.72  GLUCOSE 151* 153* 143*   Electrolytes Recent Labs  Lab 12/12/17 0351 12/13/17 0511 12/14/17 0335  CALCIUM 7.2* 8.4* 8.6*  MG 2.1 2.2 2.3  PHOS 2.6 5.1* 4.1   CBC Recent Labs  Lab 12/12/17 0351 12/13/17 0511 12/14/17 0335  WBC 21.6* 25.2* 21.1*  HGB 9.5* 9.4* 8.2*  HCT 31.7* 32.7* 28.9*  PLT 454* 530* 513*   Coag's Recent Labs   Lab 12/08/17 0747  INR 1.35   Sepsis Markers Recent Labs  Lab 12/07/17 1218 12/08/17 0747 12/08/17 0748 12/08/17 0952  LATICACIDVEN 1.6  --  1.1 0.6  PROCALCITON  --  32.13  --   --    ABG Recent Labs  Lab 12/12/17 0230 12/13/17 0249 12/14/17 0352  PHART 7.476* 7.464* 7.347*  PCO2ART 54.6* 53.6* 45.9  PO2ART 95.0 114.0* 129.0*   Liver Enzymes Recent Labs  Lab 12/07/17 1047 12/08/17 0747  AST 20 23  ALT QUANTITY NOT SUFFICIENT, UNABLE TO PERFORM TEST 12*  ALKPHOS 168* 218*  BILITOT QUANTITY NOT SUFFICIENT, UNABLE TO PERFORM TEST 0.4  ALBUMIN 2.4* 1.8*   Cardiac Enzymes Recent Labs  Lab 12/08/17 0747 12/08/17 1106  TROPONINI 0.31* <0.03   Glucose Recent Labs  Lab 12/13/17 1212 12/13/17 1525 12/13/17 1920 12/13/17 2320 12/14/17 0338 12/14/17 0723  GLUCAP 160* 111* 139* 129* 140* 135*   Imaging Dg Chest Port 1 View  Result Date: 12/13/2017 CLINICAL DATA:  37 year old female-evaluate endotracheal tube placement. EXAM: PORTABLE CHEST 1 VIEW COMPARISON:  12/13/2017 and prior radiographs FINDINGS: Cardiomediastinal silhouette is unchanged. Pulmonary vascular congestion again noted. Endotracheal tube is identified with tip extending into the RIGHT mainstem bronchus-recommend 3 cm retraction. NG tube enters stomach and LEFT IJ central venous catheter again noted with  tip overlying the UPPER SVC. No other changes identified. There is no evidence of pneumothorax. IMPRESSION: 1. Endotracheal tube with tip extending into the RIGHT mainstem bronchus-recommend 3 cm retraction. No other significant change. Apolonio Schneiders, nurse for this patient, was notified of these results on 12/13/2017 at 1:05 p.m. Electronically Signed   By: Margarette Canada M.D.   On: 12/13/2017 13:11   STUDIES:  5/2 ct abd> no acute process, bowel looks OK, mild gallbladder distention 5/2 CT sinus> generalized acute sinusitis with fluid levels, possible otomastoiditis 5/3 echo > LVEF 65-70, PA pressure 42 mm  Hg  CULTURES: 5/1 blood >NTD  5/3 blood > NTD 5/3 resp > NTD 5/7 Blood>>> 5/7 Urine>>> 5/7 Sputum>>> 5/2 resp viral panel > rhinovirus 5/2 rapid strep throat swab > negative  ANTIBIOTICS: 5/1 ceftriaxone > 5/2 5/2 azithro > 5/2 5/3 vanc > 5/4>>>5/7>>>5/9 5/3 Mero > 5/4 >>>5/7>>>5/9 5/4 ceftriaxone > 5/7>>>5/9>>>  SIGNIFICANT EVENTS: 5/3 move to ICU, intubated, paralyzed, prone  LINES/TUBES: 5/3 ETT >  5/3 L IJ CVL >  5/3 R radial arterial line >   DISCUSSION: 37 y/o female with crohns, etoh abuse, chronic narcotic use (NO IVDU, chart incorrect) who has ARDS from either rhinovirus or aspiration pneumonitis.  Oxygenation improving slowly but agitation remains a major barrier  ASSESSMENT / PLAN:  PULMONARY A: ARDS > improving but still has severe acute lung disease requiring high vent suppot 5/5 Severe vent dissynchrony P:   D/C paralytics SBT trials to extubate today CXR portable in AM Titrate O2 for sat of 88-92%  CARDIOVASCULAR A:  Mild sinus tachycardia per fever P:  Tele Monitor hemodynamics Maintain a-line in place  RENAL A:   Hypokalemia and hypocalcemia Hypernatremia P:   Monitor BMET and UOP Replace electrolytes as needed Increase free water 300 q4 Lasix 40 mg IV q6 x3 doses Zaroxolyn 10 mg PO x1  GASTROINTESTINAL A:   Crohns, history of abdominal surgery but per mother not on any chronic medications for this Chronic diarrhea, now with no bowel movements P:   D/C TF Continue pepcid D/C protonix  HEMATOLOGIC A:   Anemia of chronic disease, no bleeding P:  Monitor for bleeding  Transfuse if Hgb < 7gm/dL CBC in AM  INFECTIOUS A:   Severe CAP > from rhinovirus Aspiration pneumonitis possible Sinusitis/possible mastoiditis per CT scan Fever 5/5 due to the above, overall clinically improving despite this so doubt new infection P:   D/C ceftriaxone Reculture today D/C merrem and vanc Rocephin IV  Will ask ID to evaluate, no  longer sure about source of fever  ENDOCRINE A:   Mild Hyperglycemia P:   SSI CBGs  NEUROLOGIC A:   Need for heavy sedation in setting of severe ARDS Narcotic dependence > does not use IV drugs EtOH use Vent dyssynchrony when weaning Nimbex P:   D/C propofol and fentanyl drips Precedex drip in case of withdrawal  Thiamine/folate D/C versed  FAMILY  - Updates: No family bedside 5/9.  - Inter-disciplinary family meet or Palliative Care meeting due by:  day 7  The patient is critically ill with multiple organ systems failure and requires high complexity decision making for assessment and support, frequent evaluation and titration of therapies, application of advanced monitoring technologies and extensive interpretation of multiple databases.   Critical Care Time devoted to patient care services described in this note is  34  Minutes. This time reflects time of care of this signee Dr Jennet Maduro. This critical care time does not reflect  procedure time, or teaching time or supervisory time of PA/NP/Med student/Med Resident etc but could involve care discussion time.  Rush Farmer, M.D. [facility] Pulmonary/Critical Care Medicine. Pager: [phone]. After hours pager: [phone].  12/14/2017, 9:14 AM

## 2017-12-14 NOTE — Evaluation (Signed)
Physical Therapy Evaluation Patient Details Name: [name] MRN: [id number] DOB: [birth year] Today's Date: 12/14/2017   History of Present Illness  Pt is a 37 y.o. female admitted 12/06/17 with cough, chest pain, hearing loss, N/V, and face/hands tingling. Worked up for bronchitis vs. undiagnosed COPD and sepsis. Pt developed ARDS either from aspiration pneumonitis or rhinovirus; intubated 5/3 and self-extubated 5/8. PMH includes Crohn's disease, HTN, otitis media, anxiety, migraines, GERD, fibromyalgia, endometriosis, tobacco abuse, IV drug use.    Clinical Impression  Pt presents with an overall decrease in functional mobility secondary to above. Pt confused and poor historian; PTA, pt indep, living with son and mother. Pt with decreased awareness, attention, and ability to follow commands (potentially related to sedation/delerium/hypoxia?), also paranoid due to hallucinations while intubated. Required significant increased time for all mobility. Nausea/vomiting and lightheadedness upon sitting; VSS on 4L O2 [place]. Stood and took steps at EOB, requiring modA to prevent LOB; poor postural reactions. Follow-up recommendations currently "TBD" as cognition improves.    Follow Up Recommendations Supervision/Assistance - 24 hour(TBD)    Equipment Recommendations  (TBD)    Recommendations for Other Services       Precautions / Restrictions Precautions Precautions: Fall Restrictions Weight Bearing Restrictions: No      Mobility  Bed Mobility Overal bed mobility: Needs Assistance Bed Mobility: Supine to Sit;Sit to Supine;Rolling Rolling: Min guard   Supine to sit: Min guard Sit to supine: Min guard   General bed mobility comments: Min guard for safety, especially with lines/leads and to keep from rolling off bed, although not requiring physical assist. Increased time due to impaired problem solving and poor attention  Transfers Overall transfer level: Needs assistance Equipment  used: 1 person hand held assist Transfers: Sit to/from Stand Sit to Stand: Min assist;Mod assist         General transfer comment: HHA and minA to stand; poor sequencing and very delayed (absent?) postural reactions requiring modA to prevent LOB. Vommiting upon sitting up in bed; c/o lightheadedness, VSS stable  Ambulation/Gait Ambulation/Gait assistance: Mod assist Ambulation Distance (Feet): 3 Feet Assistive device: 1 person hand held assist Gait Pattern/deviations: Step-to pattern;Staggering right;Staggering left;Leaning posteriorly Gait velocity: Decreased   General Gait Details: Took steps towards [facility] with HHA and modA to prevent posterior/anterior LOB; pt with increased sway in all directions  Stairs            Wheelchair Mobility    Modified Rankin (Stroke Patients Only)       Balance Overall balance assessment: Needs assistance   Sitting balance-Leahy Scale: Fair       Standing balance-Leahy Scale: Poor                               Pertinent Vitals/Pain Pain Assessment: Faces Faces Pain Scale: Hurts a little bit Pain Location: Generalized Pain Descriptors / Indicators: Grimacing;Guarding Pain Intervention(s): Monitored during session    Home Living Family/patient expects to be discharged to:: Private residence Living Arrangements: Parent;Children Available Help at Discharge: Family;Available 24 hours/day Type of Home: House Home Access: Stairs to enter Entrance Stairs-Rails: None Entrance Stairs-Number of Steps: 2 Home Layout: One level Home Equipment: None Additional Comments: Mother supportive and in good health; available 24/7. Currently taking care of pt's son    Prior Function Level of Independence: Independent         Comments: Drives, does not work, has 56 y.o. son     Hand Dominance  Extremity/Trunk Assessment   Upper Extremity Assessment Upper Extremity Assessment: Generalized weakness;Difficult to assess  due to impaired cognition(ROM functionally WNL; strength impairments may be attributed to impaired cognition)    Lower Extremity Assessment Lower Extremity Assessment: Generalized weakness;Difficult to assess due to impaired cognition(ROM functionally WNL; strength impairments may be attributed to impaired cognition)    Cervical / Trunk Assessment Cervical / Trunk Assessment: Normal  Communication   Communication: Expressive difficulties  Cognition Arousal/Alertness: Awake/alert Behavior During Therapy: Flat affect Overall Cognitive Status: Impaired/Different from baseline Area of Impairment: Orientation;Attention;Following commands;Safety/judgement;Awareness;Problem solving                 Orientation Level: Disoriented to;Situation Current Attention Level: Selective   Following Commands: Follows one step commands with increased time;Follows multi-step commands inconsistently Safety/Judgement: Decreased awareness of safety;Decreased awareness of deficits   Problem Solving: Slow processing;Decreased initiation;Difficulty sequencing;Requires verbal cues General Comments: Per friend, pt's baseline cognition WFL. Attributing today's cognitive deficits secondary to delirium/sedation/(hypoxic brain injury??) - pt stating RNs attempting to strangle her to "teach her a lesson". Slow and inconsistently following commands. Poor insight into deficits. Very poor reaction time      General Comments General comments (skin integrity, edema, etc.): Friend present throughout session (pt responded well to having her present)    Exercises     Assessment/Plan    PT Assessment Patient needs continued PT services  PT Problem List Decreased strength;Decreased activity tolerance;Decreased balance;Decreased mobility;Decreased cognition;Decreased knowledge of use of DME;Decreased safety awareness       PT Treatment Interventions DME instruction;Gait training;Stair training;Functional mobility  training;Therapeutic activities;Therapeutic exercise;Balance training;Patient/family education;Neuromuscular re-education;Cognitive remediation    PT Goals (Current goals can be found in the Care Plan section)  Acute Rehab PT Goals Patient Stated Goal: Leave hospital ASAP PT Goal Formulation: With patient Time For Goal Achievement: 12/28/17 Potential to Achieve Goals: Fair    Frequency Min 3X/week   Barriers to discharge        Co-evaluation               AM-PAC PT "6 Clicks" Daily Activity  Outcome Measure Difficulty turning over in bed (including adjusting bedclothes, sheets and blankets)?: A Little Difficulty moving from lying on back to sitting on the side of the bed? : A Little Difficulty sitting down on and standing up from a chair with arms (e.g., wheelchair, bedside commode, etc,.)?: Unable Help needed moving to and from a bed to chair (including a wheelchair)?: A Little Help needed walking in hospital room?: A Little Help needed climbing 3-5 steps with a railing? : A Lot 6 Click Score: 15    End of Session Equipment Utilized During Treatment: Gait belt;Oxygen Activity Tolerance: Patient limited by fatigue Patient left: in bed;with call bell/phone within reach;with bed alarm set;with family/visitor present;with nursing/sitter in room Nurse Communication: Mobility status PT Visit Diagnosis: Other abnormalities of gait and mobility (R26.89);Difficulty in walking, not elsewhere classified (R26.2)    Time: 7353-2992 PT Time Calculation (min) (ACUTE ONLY): 54 min   Charges:   PT Evaluation $PT Eval Moderate Complexity: 1 Mod PT Treatments $Therapeutic Activity: 23-37 mins $Self Care/Home Management: 8-22   PT G Codes:       Mabeline Caras, PT, DPT Acute Rehab Services  Pager: [place] 12/14/2017, 5:57 PM

## 2017-12-14 NOTE — Procedures (Signed)
Extubation Procedure Note  Patient Details:   Name: [name] DOB: [birth year] MRN: [id number]   Airway Documentation:    Vent end date: 12/14/17 Vent end time: 1012   Evaluation  O2 sats: stable throughout Complications: No apparent complications Patient did tolerate procedure well. Bilateral Breath Sounds: Diminished(coarse)   Pt extubated per MD order.  Pt had + cuff leak. Placed on 4L [place] tolerating well. Pt has strong cough and is able to voice  Ciro Backer 12/14/2017, 10:15 AM

## 2017-12-15 DIAGNOSIS — K922 Gastrointestinal hemorrhage, unspecified: Secondary | ICD-10-CM

## 2017-12-15 LAB — CBC
HCT: 35.6 % — ABNORMAL LOW (ref 36.0–46.0)
Hemoglobin: 10.9 g/dL — ABNORMAL LOW (ref 12.0–15.0)
MCH: 24.8 pg — ABNORMAL LOW (ref 26.0–34.0)
MCHC: 30.6 g/dL (ref 30.0–36.0)
MCV: 80.9 fL (ref 78.0–100.0)
Platelets: 882 10*3/uL — ABNORMAL HIGH (ref 150–400)
RBC: 4.4 MIL/uL (ref 3.87–5.11)
RDW: 18.4 % — ABNORMAL HIGH (ref 11.5–15.5)
WBC: 33 10*3/uL — ABNORMAL HIGH (ref 4.0–10.5)

## 2017-12-15 LAB — BASIC METABOLIC PANEL
Anion gap: 18 — ABNORMAL HIGH (ref 5–15)
Anion gap: 11 (ref 5–15)
BUN: 22 mg/dL — ABNORMAL HIGH (ref 6–20)
BUN: 26 mg/dL — ABNORMAL HIGH (ref 6–20)
CO2: 31 mmol/L (ref 22–32)
CO2: 42 mmol/L — ABNORMAL HIGH (ref 22–32)
Calcium: 9.2 mg/dL (ref 8.9–10.3)
Calcium: 8.1 mg/dL — ABNORMAL LOW (ref 8.9–10.3)
Chloride: 97 mmol/L — ABNORMAL LOW (ref 101–111)
Chloride: 97 mmol/L — ABNORMAL LOW (ref 101–111)
Creatinine, Ser: 1.07 mg/dL — ABNORMAL HIGH (ref 0.44–1.00)
Creatinine, Ser: 1.26 mg/dL — ABNORMAL HIGH (ref 0.44–1.00)
GFR calc Af Amer: >60 mL/min (ref >60)
GFR calc Af Amer: >60 mL/min (ref >60)
GFR calc non Af Amer: >60 mL/min (ref >60)
GFR calc non Af Amer: 54 mL/min — ABNORMAL LOW (ref >60)
Glucose, Bld: 128 mg/dL — ABNORMAL HIGH (ref 65–99)
Glucose, Bld: 123 mg/dL — ABNORMAL HIGH (ref 65–99)
Potassium: 3.1 mmol/L — ABNORMAL LOW (ref 3.5–5.1)
Potassium: 2.8 mmol/L — ABNORMAL LOW (ref 3.5–5.1)
Sodium: 146 mmol/L — ABNORMAL HIGH (ref 135–145)
Sodium: 150 mmol/L — ABNORMAL HIGH (ref 135–145)

## 2017-12-15 LAB — CBC WITH DIFFERENTIAL/PLATELET
Band Neutrophils: 3 %
Basophils Absolute: 0 10*3/uL (ref 0.0–0.1)
Basophils Absolute: 0 10*3/uL (ref 0.0–0.1)
Basophils Absolute: 0 10*3/uL (ref 0.0–0.1)
Basophils Relative: 0 %
Basophils Relative: 0 %
Basophils Relative: 0 %
Blasts: 0 %
Eosinophils Absolute: 0 10*3/uL (ref 0.0–0.7)
Eosinophils Absolute: 0 10*3/uL (ref 0.0–0.7)
Eosinophils Absolute: 0 10*3/uL (ref 0.0–0.7)
Eosinophils Relative: 0 %
Eosinophils Relative: 0 %
Eosinophils Relative: 0 %
HCT: 38.7 % (ref 36.0–46.0)
HCT: 29.7 % — ABNORMAL LOW (ref 36.0–46.0)
HCT: 29.1 % — ABNORMAL LOW (ref 36.0–46.0)
Hemoglobin: 11.8 g/dL — ABNORMAL LOW (ref 12.0–15.0)
Hemoglobin: 8.4 g/dL — ABNORMAL LOW (ref 12.0–15.0)
Hemoglobin: 8.2 g/dL — ABNORMAL LOW (ref 12.0–15.0)
Lymphocytes Relative: 8 %
Lymphocytes Relative: 8 %
Lymphocytes Relative: 8 %
Lymphs Abs: 2.6 10*3/uL (ref 0.7–4.0)
Lymphs Abs: 2.5 10*3/uL (ref 0.7–4.0)
Lymphs Abs: 2.8 10*3/uL (ref 0.7–4.0)
MCH: 25.4 pg — ABNORMAL LOW (ref 26.0–34.0)
MCH: 24 pg — ABNORMAL LOW (ref 26.0–34.0)
MCH: 24 pg — ABNORMAL LOW (ref 26.0–34.0)
MCHC: 30.5 g/dL (ref 30.0–36.0)
MCHC: 28.3 g/dL — ABNORMAL LOW (ref 30.0–36.0)
MCHC: 28.2 g/dL — ABNORMAL LOW (ref 30.0–36.0)
MCV: 83.2 fL (ref 78.0–100.0)
MCV: 84.9 fL (ref 78.0–100.0)
MCV: 85.1 fL (ref 78.0–100.0)
Metamyelocytes Relative: 3 %
Monocytes Absolute: 2.3 10*3/uL — ABNORMAL HIGH (ref 0.1–1.0)
Monocytes Absolute: 2.5 10*3/uL — ABNORMAL HIGH (ref 0.1–1.0)
Monocytes Absolute: 3.1 10*3/uL — ABNORMAL HIGH (ref 0.1–1.0)
Monocytes Relative: 7 %
Monocytes Relative: 8 %
Monocytes Relative: 9 %
Myelocytes: 1 %
Neutro Abs: 28.2 10*3/uL — ABNORMAL HIGH (ref 1.7–7.7)
Neutro Abs: 26.7 10*3/uL — ABNORMAL HIGH (ref 1.7–7.7)
Neutro Abs: 28.7 10*3/uL — ABNORMAL HIGH (ref 1.7–7.7)
Neutrophils Relative %: 78 %
Neutrophils Relative %: 84 %
Neutrophils Relative %: 83 %
Other: 0 %
Platelets: 1104 10*3/uL (ref 150–400)
Platelets: 739 10*3/uL — ABNORMAL HIGH (ref 150–400)
Platelets: 727 10*3/uL — ABNORMAL HIGH (ref 150–400)
Promyelocytes Relative: 0 %
RBC: 4.65 MIL/uL (ref 3.87–5.11)
RBC: 3.5 MIL/uL — ABNORMAL LOW (ref 3.87–5.11)
RBC: 3.42 MIL/uL — ABNORMAL LOW (ref 3.87–5.11)
RDW: 18.9 % — ABNORMAL HIGH (ref 11.5–15.5)
RDW: 18.7 % — ABNORMAL HIGH (ref 11.5–15.5)
RDW: 19.1 % — ABNORMAL HIGH (ref 11.5–15.5)
Smear Review: INCREASED
WBC: 33.1 10*3/uL — ABNORMAL HIGH (ref 4.0–10.5)
WBC: 31.7 10*3/uL — ABNORMAL HIGH (ref 4.0–10.5)
WBC: 34.6 10*3/uL — ABNORMAL HIGH (ref 4.0–10.5)
nRBC: 0 /100 WBC

## 2017-12-15 LAB — LACTIC ACID, PLASMA: Lactic Acid, Venous: 2.3 mmol/L (ref 0.5–1.9)

## 2017-12-15 LAB — TYPE AND SCREEN
ABO/RH(D): O POS
Antibody Screen: NEGATIVE

## 2017-12-15 LAB — GLUCOSE, CAPILLARY: Glucose-Capillary: 122 mg/dL — ABNORMAL HIGH (ref 65–99)

## 2017-12-15 LAB — PROCALCITONIN: Procalcitonin: 1.01 ng/mL

## 2017-12-15 MED ORDER — FENTANYL CITRATE (PF) 100 MCG/2ML IJ SOLN
25.0000 ug | Freq: Once | INTRAMUSCULAR | Status: AC
  Administered 2017-12-15: 25 ug via INTRAVENOUS
  Filled 2017-12-15: qty 2

## 2017-12-15 MED ORDER — SODIUM CHLORIDE 0.9 % IV SOLN
8.0000 mg/h | INTRAVENOUS | Status: DC
  Administered 2017-12-15 – 2017-12-17 (×5): 8 mg/h via INTRAVENOUS
  Filled 2017-12-15: qty 40
  Filled 2017-12-15 (×3): qty 80
  Filled 2017-12-15 (×2): qty 40

## 2017-12-15 MED ORDER — SODIUM CHLORIDE 0.9 % IV SOLN
250.0000 mL | INTRAVENOUS | Status: DC | PRN
  Administered 2017-12-15: 250 mL via INTRAVENOUS

## 2017-12-15 MED ORDER — LACTATED RINGERS IV BOLUS
2000.0000 mL | Freq: Once | INTRAVENOUS | Status: AC
  Administered 2017-12-15: 2000 mL via INTRAVENOUS

## 2017-12-15 MED ORDER — PANTOPRAZOLE SODIUM 40 MG IV SOLR: 40.0000 mg | Freq: Two times a day (BID) | INTRAVENOUS | Status: DC

## 2017-12-15 MED ORDER — POTASSIUM CHLORIDE 20 MEQ/15ML (10%) PO SOLN
40.0000 meq | ORAL | Status: AC
  Administered 2017-12-15 – 2017-12-16 (×2): 40 meq
  Filled 2017-12-15 (×3): qty 30

## 2017-12-15 MED ORDER — DEXTROSE 5 % IV SOLN
INTRAVENOUS | Status: DC
  Administered 2017-12-15 – 2017-12-18 (×4): via INTRAVENOUS

## 2017-12-15 MED ORDER — SODIUM CHLORIDE 0.9 % IV BOLUS
500.0000 mL | Freq: Once | INTRAVENOUS | Status: AC
  Administered 2017-12-15: 500 mL via INTRAVENOUS

## 2017-12-15 MED ORDER — POTASSIUM CHLORIDE 10 MEQ/50ML IV SOLN
10.0000 meq | INTRAVENOUS | Status: AC
  Administered 2017-12-15 (×4): 10 meq via INTRAVENOUS
  Filled 2017-12-15 (×4): qty 50

## 2017-12-15 NOTE — Progress Notes (Signed)
PT Cancellation Note  Patient Details Name: [name] MRN: [id number] DOB: October 19, 1980   Cancelled Treatment:    Reason Eval/Treat Not Completed: Patient not medically ready. Per RN, pt not appropriate for PT today; pt with upper GIB and vomiting blood. Will follow-up for PT treatment.  Mabeline Caras, PT, DPT Acute Rehab Services  Pager: [place] 12/15/2017, 11:13 AM

## 2017-12-15 NOTE — Consult Note (Signed)
Referring Provider:  Dr. Johnnye Sima Primary Care Physician:  Patient, [name] Primary Gastroenterologist:  Althia Forts dismissed from [facility]  Reason for Consultation:  Possible upper GI bleed  HPI: [name] is a 37 y.o. female history of Crohn's disease previously followed by [facility], history of rectal prolapse followed by Dr. Marcello Moores,  admitted to the hospital on 12/07/2007 for further evaluation of cough, chest pain, nausea, vomiting and diarrhea.CT abdomen pelvis without contrast on admission was negative for acute changes with possible physiological mild gallbladder distention. Patient subsequently developed ARDS requiring intubation.patient was extubated yesterday. Following extubation, patient started having nausea and initially nonbloody vomiting.  NG tube was placed with initial nonbloody drainage. Subsequentlypatient started having coffee-ground colored drainage through NG. GI is consulted for further evaluation.  Patient currently somewhat confused. Brother at bedside. Not able to obtain history from patient. Discussed with nursing staff. No evidence of melena or bright red blood per rectum. Hemoglobin stable.  Past Medical History:  Diagnosis Date  . Abdominal pain, unspecified site 03/24/2009  . 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 ADHD 12/02/2009  . Allergic rhinitis, cause unspecified 01/21/2011  . ANXIETY 03/24/2009  . B12 DEFICIENCY 04/28/2009  . Bronchitis 12/2017  . BURSITIS, RIGHT KNEE 02/05/2010  . Cellulitis and abscess of leg, except foot 02/05/2010  . Cervicalgia 12/02/2009  . COMMON MIGRAINE 02/05/2010  . CROHN'S [facility] INTESTINE 05/19/2009  . ECZEMA 05/20/2010  . Endometriosis 08/04/2011  . Fibromyalgia   . GERD 03/24/2009  . HEADACHE, CHRONIC 03/24/2009  . HYPERTENSION 03/24/2009  . Osteoarthritis   . OTITIS MEDIA, LEFT 08/12/2010  . SMOKER 12/02/2009  . Spine pain 01/21/2011   neck and thoracic spine  . VITAMIN B1 DEFICIENCY 09/21/2009  . Wheezing 08/12/2010     Past Surgical History:  Procedure Laterality Date  . BREAST ENHANCEMENT SURGERY  2004  . ENDOMETRIAL ABLATION  01/2009   Thinks laproscopic with possible transvaginal  . RECTAL PROLAPSE REPAIR      Prior to Admission medications   Medication Sig Start Date End Date Taking? Authorizing Provider  esomeprazole (NEXIUM) 40 MG capsule Take 40 mg by mouth 2 (two) times daily before a meal.   Yes [provider]  naproxen sodium (ALEVE) 220 MG tablet Take 220-440 mg by mouth 2 (two) times daily as needed (for pain or discomfort).   Yes [provider]  ALPRAZolam Duanne Moron) 1 MG tablet Take 1 tablet (1 mg total) by mouth 3 (three) times daily as needed for anxiety. Patient not taking: Reported on 12/06/2017 11/05/13   Susy Frizzle, MD  budesonide (ENTOCORT EC) 3 MG 24 hr capsule Take 3 capsules (9 mg total) by mouth daily. Patient not taking: Reported on 12/06/2017 10/30/13   Kelvin Cellar, MD  ciprofloxacin (CIPRO) 500 MG tablet Take 1 tablet (500 mg total) by mouth every 12 (twelve) hours. Patient not taking: Reported on 12/06/2017 04/18/16   Ashley Murrain, NP  clindamycin (CLEOCIN) 300 MG capsule Take 1 capsule (300 mg total) by mouth every 6 (six) hours. Patient not taking: Reported on 12/06/2017 04/18/16   Ashley Murrain, NP  fexofenadine (ALLEGRA) 180 MG tablet Take 1 tablet (180 mg total) by mouth daily. Patient not taking: Reported on 12/06/2017 03/28/12 12/06/17  Biagio Borg, MD  magnesium oxide (MAG-OX) 400 MG tablet Take 1 tablet (400 mg total) by mouth 2 (two) times daily. Patient not taking: Reported on 12/06/2017 11/07/13   Susy Frizzle, MD  metoprolol succinate (TOPROL-XL) 100 MG  24 hr tablet Take 1 tablet (100 mg total) by mouth daily. Take with or immediately following a meal. Patient not taking: Reported on 12/06/2017 11/05/13   Susy Frizzle, MD  ondansetron (ZOFRAN ODT) 4 MG disintegrating tablet Take 1 tablet (4 mg total) by mouth every 8 (eight) hours as needed  for nausea or vomiting. Patient not taking: Reported on 12/06/2017 09/28/17   [facility], [place] N, PA-C  oxyCODONE (ROXICODONE) 5 MG immediate release tablet Take 1 tablet (5 mg total) by mouth every 4 (four) hours as needed for severe pain. Patient not taking: Reported on 12/06/2017 04/18/16   Ashley Murrain, NP  oxyCODONE-acetaminophen (PERCOCET/ROXICET) 5-325 MG tablet Take 1 tablet by mouth every 6 (six) hours as needed for severe pain. Patient not taking: Reported on 12/06/2017 09/28/17   [facility], [place] N, PA-C  pantoprazole (PROTONIX) 40 MG tablet Take 1 tablet (40 mg total) by mouth 2 (two) times daily. Patient not taking: Reported on 12/06/2017 11/05/13   Susy Frizzle, MD  polyethylene glycol [facility] / Floria Raveling) packet Take 34 g by mouth daily. Patient not taking: Reported on 12/06/2017 10/25/13   Wendall Papa, MD  amLODipine (NORVASC) 10 MG tablet Take 1 tablet (10 mg total) by mouth daily. 09/30/11 11/01/11  Biagio Borg, MD  pregabalin (LYRICA) 50 MG capsule Take 1 capsule (50 mg total) by mouth 3 (three) times daily. 09/30/11 11/01/11  Biagio Borg, MD    Scheduled Meds: . budesonide (PULMICORT) nebulizer solution  0.5 mg Nebulization BID  . chlorhexidine gluconate (MEDLINE KIT)  15 mL Mouth Rinse BID  . Chlorhexidine Gluconate Cloth  6 each Topical Daily  . folic acid  1 mg Oral Daily  . heparin  5,000 Units Subcutaneous Q8H  . mouth rinse  15 mL Mouth Rinse QID  . [START ON 12/18/2017] pantoprazole  40 mg Intravenous Q12H  . sodium chloride flush  10-40 mL Intracatheter Q12H  . thiamine  100 mg Per Tube Daily   Continuous Infusions: . sodium chloride 50 mL/hr at 12/15/17 0555  . cefTRIAXone (ROCEPHIN)  IV Stopped (12/14/17 1641)  . dexmedetomidine (PRECEDEX) IV infusion 0.4 mcg/kg/hr (12/15/17 0930)  . pantoprozole (PROTONIX) infusion 8 mg/hr (12/15/17 0929)   PRN Meds:.sodium chloride, acetaminophen (TYLENOL) oral liquid 160 mg/5 mL, albuterol, alum & mag hydroxide-simeth,  hydrALAZINE, LORazepam, ondansetron (ZOFRAN) IV, sodium chloride flush  Allergies as of 12/06/2017 - Review Complete 12/06/2017  Allergen Reaction Noted  . Penicillins Hives 03/24/2009  . Doxycycline Other (See Comments)     Family History  Problem Relation Age of Onset  . Cancer Mother        breast  . Clotting disorder Mother   . Cancer Maternal Grandmother        Stomach Cancer  . Cancer Maternal Grandfather        Esophageal Cancer    Social History   Socioeconomic History  . Marital status: Single    Spouse name: Not on file  . Number of children: 1  . Years of education: Not on file  . Highest education level: Not on file  Occupational History  . 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 Financial resource strain: Not on file  . Food insecurity:    Worry: Not on file    Inability: Not on file  . Transportation needs:    Medical: Not on file    Non-medical: Not on file  Tobacco Use  .  Smoking status: Current Every Day Smoker    Packs/day: 0.50    Years: 14.00    Pack years: 7.00    Types: Cigarettes  . Smokeless tobacco: Never Used  . Tobacco comment: 5 daily  Substance and Sexual Activity  . Alcohol use: Yes    Comment: occasionally  . Drug use: Not Currently    Types: Other-see comments    Comment: oral narcotics, family says she does not use IV drugs  . Sexual activity: Never  Lifestyle  . Physical activity:    Days per week: Not on file    Minutes per session: Not on file  . Stress: Not on file  Relationships  . Social connections:    Talks on phone: Not on file    Gets together: Not on file    Attends religious service: Not on file    Active member of club or organization: Not on file    Attends meetings of clubs or organizations: Not on file    Relationship status: Not on file  . Intimate partner violence:    Fear of current or ex partner: Not on file    Emotionally abused: Not on  file    Physically abused: Not on file    Forced sexual activity: Not on file  Other Topics Concern  . Not on file  Social History Narrative   Daily caffeine 1 cup   Patient does not get regular exercise   Lives with Fuller Mandril and baby son born 11/2010    Review of Systems: not able to obtain  Physical Exam: Vital signs: Vitals:   12/15/17 1200 12/15/17 1300  BP: (!) 96/57 107/76  Pulse: (!) 124 (!) 123  Resp: (!) 22 (!) 23  Temp:    SpO2: 97% 100%   Last BM Date: 12/15/17 General:   Ill-appearing patient. Not in acute distress. Not able to examine oral mucosa. Lungs. Few rhonchi but no respiratory distress at this time. Heart.Tachycardia.?? Murmur present Abdomen. Soft, generalized discomfort on palpation, nontender, bowel sounds present but hypoactive. No peritoneal signs. Neurologic. Confused. Alert but not oriented. Lower extremity. No significant edema.  GI:  Lab Results: Recent Labs    12/14/17 0335 12/15/17 0306 12/15/17 0933  WBC 21.1* 33.0* 33.1*  HGB 8.2* 10.9* 11.8*  HCT 28.9* 35.6* 38.7  PLT 513* 882* 1,104*   BMET Recent Labs    12/13/17 0511 12/14/17 0335 12/15/17 0306  NA 146* 143 146*  K 3.6 4.4 3.1*  CL 100* 110 97*  CO2 34* 26 31  GLUCOSE 153* 143* 128*  BUN 24* 20 22*  CREATININE 0.91 0.72 1.07*  CALCIUM 8.4* 8.6* 9.2   LFT No results for input(s): PROT, ALBUMIN, AST, ALT, ALKPHOS, BILITOT, BILIDIR, IBILI in the last 72 hours. PT/INR No results for input(s): LABPROT, INR in the last 72 hours.   Studies/Results: Dg Chest Port 1 View  Result Date: 12/14/2017 CLINICAL DATA:  Check endotracheal tube placement EXAM: PORTABLE CHEST 1 VIEW COMPARISON:  12/13/2017 FINDINGS: Cardiac shadow is stable. Endotracheal tube and nasogastric catheter are noted in satisfactory position. Lungs are well aerated bilaterally. Considerable overlying artifact is seen. The vascular congestion seen previously has improved slightly in the interval from the  prior exam. IMPRESSION: Mild improved vascular congestion. No new focal abnormality is noted. Electronically Signed   By: Inez Catalina M.D.   On: 12/14/2017 10:12   Dg Abd Portable 1v  Result Date: 12/14/2017 CLINICAL DATA:  NG tube placement EXAM: PORTABLE ABDOMEN -  1 VIEW COMPARISON:  12/10/2017 FINDINGS: Enteric tube terminates in the proximal gastric body. Lung bases are clear. IMPRESSION: Enteric tube terminates in the proximal gastric body. Electronically Signed   By: Julian Hy M.D.   On: 12/14/2017 23:52    Impression/Plan: - coffee-ground drainage in the NG suction. No melena. Hemoglobin stable. BUN trending down. - ARDS. Extubated yesterday. - history of Crohn's disease. Appears to have partial right hemicolectomy and ileocolonic anastomosis - mildly elevated alkaline phosphatase. No repeat LFTs since 12/08/2017. AST, ALT and total bilirubin normal. - significant leukocytosis. - elevated platelet counts. 1104. ?? Reactive.  Recommendations -------------------------- - Coffee-ground liquid in NG suction after episodes of nausea vomiting and after placement of NG tube. No evidence of melena. Hemoglobin stable. Recommended conservative management for now. - Consider EGD if there is drop In hemoglobin or evidence of melena. - Continue Protonix drip for now. - monitor  H&H. - GI will follow   LOS: 8 days   Otis Brace  MD, FACP 12/15/2017, 2:53 PM  Contact #  [phone]

## 2017-12-15 NOTE — Progress Notes (Signed)
CRITICAL VALUE ALERT  Critical Value:  Platelets 1104  Date & Time Notied:  12/15/2017 at 10:30   Provider Notified: Dr. Chase Caller  Orders Received/Actions taken: awaiting

## 2017-12-15 NOTE — Progress Notes (Signed)
CRITICAL VALUE ALERT  Critical Value:  LA 2.3  Date & Time Notied:  12/15/2017 at 1245  Provider Notified: Dr. Chase Caller  Orders Received/Actions taken: 2 L bolus LR    CVP 2 and BP 96/52- Dr. Chase Caller made aware

## 2017-12-15 NOTE — Plan of Care (Signed)

## 2017-12-15 NOTE — Progress Notes (Signed)
INFECTIOUS DISEASE PROGRESS NOTE  ID: [name] is a 37 y.o. female with  Principal Problem:   Bronchitis Active Problems:   Anxiety state   Essential hypertension   GERD   CROHN'S DISEASE-SMALL INTESTINE   Abdominal pain, generalized   Hypokalemia   Hypocalcemia   Hypomagnesemia   Sepsis ([place])   AKI (acute kidney injury) ([place])   Acute sinusitis   Acute respiratory failure with hypoxia (HCC)   Acute respiratory failure with hypoxemia (HCC)  Subjective: C/o congestion.   Abtx:  Anti-infectives (From admission, onward)   Start     Dose/Rate Route Frequency Ordered Stop   12/14/17 1500  cefTRIAXone (ROCEPHIN) 1 g in sodium chloride 0.9 % 100 mL IVPB     1 g 200 mL/hr over 30 Minutes Intravenous Every 24 hours 12/14/17 0935     12/14/17 1500  metroNIDAZOLE (FLAGYL) IVPB 500 mg  Status:  Discontinued     500 mg 100 mL/hr over 60 Minutes Intravenous Every 8 hours 12/14/17 0935 12/14/17 0954   12/14/17 1500  metroNIDAZOLE (FLAGYL) IVPB 500 mg  Status:  Discontinued     500 mg 100 mL/hr over 60 Minutes Intravenous Every 8 hours 12/14/17 1010 12/14/17 1018   12/12/17 2300  vancomycin (VANCOCIN) IVPB 750 mg/150 ml premix  Status:  Discontinued     750 mg 150 mL/hr over 60 Minutes Intravenous Every 12 hours 12/12/17 0846 12/14/17 0930   12/12/17 1100  meropenem (MERREM) 1 g in sodium chloride 0.9 % 100 mL IVPB  Status:  Discontinued     1 g 200 mL/hr over 30 Minutes Intravenous Every 12 hours 12/12/17 0840 12/14/17 0930   12/12/17 1000  vancomycin (VANCOCIN) 1,500 mg in sodium chloride 0.9 % 500 mL IVPB     1,500 mg 250 mL/hr over 120 Minutes Intravenous  Once 12/12/17 0840 12/12/17 1249   12/09/17 0900  cefTRIAXone (ROCEPHIN) 1 g in sodium chloride 0.9 % 100 mL IVPB  Status:  Discontinued    Note to Pharmacy:  Please adjust dosing as appropriate for indication   1 g 200 mL/hr over 30 Minutes Intravenous Every 24 hours 12/09/17 0824 12/12/17 0840   12/08/17 2200   vancomycin (VANCOCIN) IVPB 750 mg/150 ml premix  Status:  Discontinued     750 mg 150 mL/hr over 60 Minutes Intravenous Every 12 hours 12/08/17 0940 12/09/17 0809   12/08/17 0900  meropenem (MERREM) 1 g in sodium chloride 0.9 % 100 mL IVPB  Status:  Discontinued     1 g 200 mL/hr over 30 Minutes Intravenous Every 8 hours 12/08/17 0812 12/09/17 0809   12/08/17 0815  vancomycin (VANCOCIN) 1,250 mg in sodium chloride 0.9 % 250 mL IVPB     1,250 mg 166.7 mL/hr over 90 Minutes Intravenous  Once 12/08/17 0812 12/08/17 1053   12/07/17 2200  cefTRIAXone (ROCEPHIN) 1 g in sodium chloride 0.9 % 100 mL IVPB  Status:  Discontinued     1 g 200 mL/hr over 30 Minutes Intravenous Every 24 hours 12/07/17 0148 12/08/17 0737   12/07/17 2200  azithromycin (ZITHROMAX) 500 mg in sodium chloride 0.9 % 250 mL IVPB  Status:  Discontinued     500 mg 250 mL/hr over 60 Minutes Intravenous Every 24 hours 12/07/17 0148 12/08/17 0740   12/06/17 2315  cefTRIAXone (ROCEPHIN) 1 g in sodium chloride 0.9 % 100 mL IVPB     1 g 200 mL/hr over 30 Minutes Intravenous  Once 12/06/17 2310 12/06/17 2358  12/06/17 2315  azithromycin (ZITHROMAX) 500 mg in sodium chloride 0.9 % 250 mL IVPB     500 mg 250 mL/hr over 60 Minutes Intravenous  Once 12/06/17 2310 12/07/17 0100      Medications:  Scheduled: . budesonide (PULMICORT) nebulizer solution  0.5 mg Nebulization BID  . chlorhexidine gluconate (MEDLINE KIT)  15 mL Mouth Rinse BID  . Chlorhexidine Gluconate Cloth  6 each Topical Daily  . folic acid  1 mg Oral Daily  . heparin  5,000 Units Subcutaneous Q8H  . mouth rinse  15 mL Mouth Rinse QID  . [START ON 12/18/2017] pantoprazole  40 mg Intravenous Q12H  . sodium chloride flush  10-40 mL Intracatheter Q12H  . thiamine  100 mg Per Tube Daily    Objective: Vital signs in last 24 hours: Temp:  [98.3 F (36.8 C)-99.9 F (37.7 C)] 99.9 F (37.7 C) (05/10 0719) Pulse Rate:  [73-148] 148 (05/10 0800) Resp:  [12-34] 29 (05/10  1100) BP: (101-153)/(68-127) 106/71 (05/10 1100) SpO2:  [92 %-100 %] 99 % (05/10 0800) Arterial Line BP: (108)/(64-67) 108/67 (05/09 1300) Weight:  [61.5 kg (135 lb 9.3 oz)] 61.5 kg (135 lb 9.3 oz) (05/10 0300)   General appearance: alert and mild distress Resp: diminished breath sounds anterior - bilateral Cardio: tachycardia GI: normal findings: bowel sounds normal and mild tenderness, mild ditension.  Extremities: edema none  Lab Results Recent Labs    12/14/17 0335 12/15/17 0306 12/15/17 0933  WBC 21.1* 33.0* 33.1*  HGB 8.2* 10.9* 11.8*  HCT 28.9* 35.6* 38.7  NA 143 146*  --   K 4.4 3.1*  --   CL 110 97*  --   CO2 26 31  --   BUN 20 22*  --   CREATININE 0.72 1.07*  --    Liver Panel No results for input(s): PROT, ALBUMIN, AST, ALT, ALKPHOS, BILITOT, BILIDIR, IBILI in the last 72 hours. Sedimentation Rate No results for input(s): ESRSEDRATE in the last 72 hours. C-Reactive Protein No results for input(s): CRP in the last 72 hours.  Microbiology: Recent Results (from the past 240 hour(s))  Blood culture (routine x 2)     Status: None   Collection Time: 12/06/17 10:50 PM  Result Value Ref Range Status   Specimen Description BLOOD RIGHT ARM  Final   Special Requests   Final    BOTTLES DRAWN AEROBIC AND ANAEROBIC Blood Culture adequate volume   Culture   Final    NO GROWTH 5 DAYS Performed at [facility], [address], [place], [place] [address]    Report Status 12/12/2017 FINAL  Final  Blood culture (routine x 2)     Status: None   Collection Time: 12/07/17  2:41 AM  Result Value Ref Range Status   Specimen Description BLOOD LEFT ANTECUBITAL  Final   Special Requests   Final    IN BOTH AEROBIC AND ANAEROBIC BOTTLES Blood Culture adequate volume   Culture   Final    NO GROWTH 5 DAYS Performed at [facility], [place] [address] [place], [place] [address]    Report Status 12/12/2017 FINAL  Final  Respiratory Panel by PCR     Status: Abnormal    Collection Time: 12/07/17  9:07 PM  Result Value Ref Range Status   Adenovirus NOT DETECTED NOT DETECTED Final   Coronavirus 229E NOT DETECTED NOT DETECTED Final   Coronavirus HKU1 NOT DETECTED NOT DETECTED Final   Coronavirus NL63 NOT DETECTED NOT  DETECTED Final   Coronavirus OC43 NOT DETECTED NOT DETECTED Final   Metapneumovirus NOT DETECTED NOT DETECTED Final   Rhinovirus / Enterovirus DETECTED (A) NOT DETECTED Final   Influenza A NOT DETECTED NOT DETECTED Final   Influenza B NOT DETECTED NOT DETECTED Final   Parainfluenza Virus 1 NOT DETECTED NOT DETECTED Final   Parainfluenza Virus 2 NOT DETECTED NOT DETECTED Final   Parainfluenza Virus 3 NOT DETECTED NOT DETECTED Final   Parainfluenza Virus 4 NOT DETECTED NOT DETECTED Final   Respiratory Syncytial Virus NOT DETECTED NOT DETECTED Final   Bordetella pertussis NOT DETECTED NOT DETECTED Final   Chlamydophila pneumoniae NOT DETECTED NOT DETECTED Final   Mycoplasma pneumoniae NOT DETECTED NOT DETECTED Final    Comment: Performed at [facility], [place] [address] [place], [place] [address]  Group A Strep by PCR     Status: None   Collection Time: 12/07/17  9:07 PM  Result Value Ref Range Status   Group A Strep by PCR NOT DETECTED NOT DETECTED Final    Comment: Performed at [facility], [place] [address] [place],  [place] [address]  Culture, respiratory (tracheal aspirate)     Status: None   Collection Time: 12/08/17  7:25 AM  Result Value Ref Range Status   Specimen Description TRACHEAL ASPIRATE  Final   Special Requests NONE  Final   Gram Stain   Final    MODERATE WBC PRESENT, PREDOMINANTLY PMN NO ORGANISMS SEEN    Culture   Final    NO GROWTH 2 DAYS Performed at [facility], [place] [address] [place], [place] [address]    Report Status 12/10/2017 FINAL  Final  MRSA PCR Screening     Status: None   Collection Time: 12/08/17  7:41 AM  Result Value Ref Range Status   MRSA by PCR NEGATIVE NEGATIVE Final     Comment:        The GeneXpert MRSA Assay (FDA approved for NASAL specimens only), is one component of a comprehensive MRSA colonization surveillance program. It is not intended to diagnose MRSA infection nor to guide or monitor treatment for MRSA infections. Performed at [facility], [place] [address], [place], [place] [address]   Culture, blood (routine x 2)     Status: None   Collection Time: 12/08/17  7:47 AM  Result Value Ref Range Status   Specimen Description BLOOD RIGHT MIDLINE  Final   Special Requests   Final    BOTTLES DRAWN AEROBIC ONLY Blood Culture adequate volume   Culture   Final    NO GROWTH 5 DAYS Performed at [facility], [address], [place], [place] [address]    Report Status 12/13/2017 FINAL  Final  Culture, blood (routine x 2)     Status: None   Collection Time: 12/08/17  7:58 AM  Result Value Ref Range Status   Specimen Description BLOOD RIGHT HAND  Final   Special Requests   Final    BOTTLES DRAWN AEROBIC ONLY Blood Culture adequate volume   Culture   Final    NO GROWTH 5 DAYS Performed at [facility], [place] [address] [place], [place] [address]    Report Status 12/13/2017 FINAL  Final  Culture, expectorated sputum-assessment     Status: None   Collection Time: 12/12/17  8:38 AM  Result Value Ref Range Status   Specimen Description SPUTUM  Final   Special Requests NONE  Final  Sputum evaluation   Final    THIS SPECIMEN IS ACCEPTABLE FOR SPUTUM CULTURE Performed at [facility], [place] [address] [place], [place] [address]    Report Status 12/12/2017 FINAL  Final  Urine Culture     Status: None   Collection Time: 12/12/17  8:38 AM  Result Value Ref Range Status   Specimen Description URINE, CATHETERIZED  Final   Special Requests NONE  Final   Culture   Final    NO GROWTH Performed at [facility], [place] [address], [place], [place] [address]    Report Status 12/13/2017 FINAL  Final  Culture, respiratory  (NON-Expectorated)     Status: None   Collection Time: 12/12/17  8:38 AM  Result Value Ref Range Status   Specimen Description SPUTUM  Final   Special Requests NONE Reflexed from [id number]  Final   Gram Stain   Final    MODERATE WBC PRESENT, PREDOMINANTLY PMN NO ORGANISMS SEEN    Culture   Final    NO GROWTH 2 DAYS Performed at [facility], [place] [address], [place], [place] [address]    Report Status 12/14/2017 FINAL  Final  Culture, blood (Routine X 2) w Reflex to ID Panel     Status: None (Preliminary result)   Collection Time: 12/12/17  9:09 AM  Result Value Ref Range Status   Specimen Description BLOOD LEFT ANTECUBITAL  Final   Special Requests   Final    BOTTLES DRAWN AEROBIC ONLY Blood Culture adequate volume   Culture   Final    NO GROWTH 3 DAYS Performed at [facility], [place] [address] [place], [place] [address]    Report Status PENDING  Incomplete  Culture, blood (Routine X 2) w Reflex to ID Panel     Status: None (Preliminary result)   Collection Time: 12/12/17  9:10 AM  Result Value Ref Range Status   Specimen Description BLOOD BLOOD RIGHT FOREARM  Final   Special Requests   Final    BOTTLES DRAWN AEROBIC ONLY Blood Culture adequate volume   Culture   Final    NO GROWTH 3 DAYS Performed at [facility], [place] [address] [place], [place] [address]    Report Status PENDING  Incomplete    Studies/Results: Dg Chest Port 1 View  Result Date: 12/14/2017 CLINICAL DATA:  Check endotracheal tube placement EXAM: PORTABLE CHEST 1 VIEW COMPARISON:  12/13/2017 FINDINGS: Cardiac shadow is stable. Endotracheal tube and nasogastric catheter are noted in satisfactory position. Lungs are well aerated bilaterally. Considerable overlying artifact is seen. The vascular congestion seen previously has improved slightly in the interval from the prior exam. IMPRESSION: Mild improved vascular congestion. No new focal abnormality is noted. Electronically Signed   By: Inez Catalina M.D.   On: 12/14/2017 10:12   Dg Chest Port 1 View  Result Date: 12/13/2017 CLINICAL DATA:  37 year old female-evaluate endotracheal tube placement. EXAM: PORTABLE CHEST 1 VIEW COMPARISON:  12/13/2017 and prior radiographs FINDINGS: Cardiomediastinal silhouette is unchanged. Pulmonary vascular congestion again noted. Endotracheal tube is identified with tip extending into the RIGHT mainstem bronchus-recommend 3 cm retraction. NG tube enters stomach and LEFT IJ central venous catheter again noted with tip overlying the UPPER SVC. No other changes identified. There is no evidence of pneumothorax. IMPRESSION: 1. Endotracheal tube with tip extending into the RIGHT mainstem bronchus-recommend 3 cm retraction. No other significant change. Apolonio Schneiders, nurse for this patient, was notified of these results on  12/13/2017 at 1:05 p.m. Electronically Signed   By: Margarette Canada M.D.   On: 12/13/2017 13:11   Dg Abd Portable 1v  Result Date: 12/14/2017 CLINICAL DATA:  NG tube placement EXAM: PORTABLE ABDOMEN - 1 VIEW COMPARISON:  12/10/2017 FINDINGS: Enteric tube terminates in the proximal gastric body. Lung bases are clear. IMPRESSION: Enteric tube terminates in the proximal gastric body. Electronically Signed   By: Julian Hy M.D.   On: 12/14/2017 23:52     Assessment/Plan: Aspiration PNA Narcotic abuse GI bleed   Total days of antibiotics: 8 azithro-->ceftriaxone-->merrem/vanco --> ceftriaxone (5-9)  Would aim to continue ceftriaxone til 5-14 She appears to be improving.  Consider GI eval? Available as needed.          Bobby Rumpf MD, FACP Infectious Diseases (pager) [phone] [email or web address] 12/15/2017, 11:33 AM  LOS: 8 days

## 2017-12-15 NOTE — Progress Notes (Signed)
SLP Cancellation Note  Patient Details Name: KIMMORA RISENHOOVER MRN: [id number] DOB: 1981/06/30   Cancelled treatment:       Reason Eval/Treat Not Completed: Medical issues which prohibited therapy. Per RN pt with vomiting overnight requiring NGT. SLP will f/u for readiness.   Germain Osgood 12/15/2017, 8:12 AM  Germain Osgood, M.A. CCC-SLP [phone]

## 2017-12-15 NOTE — Progress Notes (Signed)
OT Cancellation Note  Patient Details Name: SHAQUIRA MOROZ MRN: [id number] DOB: 24-Oct-1980   Cancelled Treatment:    Reason Eval/Treat Not Completed: Medical issues which prohibited therapy. Per RN, pt not appropriate for OT today; pt with upper GIB and vomiting blood. Will follow-up for OT eval at later date.    Almon Register [phone] 12/15/2017, 11:26 AM

## 2017-12-15 NOTE — Progress Notes (Signed)
[facility] Progress Note Patient Name: [name] DOB: June 01, 1981 MRN: [id number]   Date of Service  12/15/2017  HPI/Events of Note  Hypernatremia and hypokalemia`  eICU Interventions  IVFs changed to D5W at 50 ml/hr Potassium replaced     Intervention Category Intermediate Interventions: Electrolyte abnormality - evaluation and management  DETERDING,ELIZABETH 12/15/2017, 9:15 PM

## 2017-12-15 NOTE — Progress Notes (Signed)
[facility] Progress Note Patient Name: [name] DOB: Nov 08, 1980 MRN: [id number]   Date of Service  12/15/2017  HPI/Events of Note  hypokalemia  eICU Interventions  Potassium replacement     Intervention Category Minor Interventions: Electrolytes abnormality - evaluation and management  Sharia Reeve 12/15/2017, 5:37 AM

## 2017-12-15 NOTE — Progress Notes (Signed)
[facility] ADULT ICU REPLACEMENT PROTOCOL FOR AM LAB REPLACEMENT ONLY  The patient does apply for the [facility] Adult ICU Electrolyte Replacment Protocol based on the criteria listed below:   1. Is GFR >/= 40 ml/min? Yes.    Patient's GFR today is >60 2. Is urine output >/= 0.5 ml/kg/hr for the last 6 hours? Yes.   Patient's UOP is 0.5 ml/kg/hr 3. Is BUN < 60 mg/dL? Yes.    Patient's BUN today is 22 4. Abnormal electrolyte(s):K3.1 5. Ordered repletion with: Per protocol 6. If a panic level lab has been reported, has the CCM MD in charge been notified? Yes.  .   Physician: A Panchal,MD  Vear Clock 12/15/2017 4:40 AM

## 2017-12-15 NOTE — Progress Notes (Addendum)
PULMONARY / CRITICAL CARE MEDICINE   Name: [name] MRN: [id number] DOB: 10-23-1980    ADMISSION DATE:  12/06/2017 CONSULTATION DATE:  12/08/17  REFERRING MD:  Lonny Prude  CHIEF COMPLAINT:  Dyspnea  BRIEF HISTORY OF PRESENT ILLNESS:   37 y/o female with crohn's, EtOH abuse, chronic narcotic use admitted with cough, chest pain dyspnea.  Developed ARDS either from aspiration pneumonitis or rhinovirus.     STUDIES:  5/2 ct abd> no acute process, bowel looks OK, mild gallbladder distention 5/2 CT sinus> generalized acute sinusitis with fluid levels, possible otomastoiditis 5/3 echo > LVEF 65-70, PA pressure 42 mm Hg  CULTURES: 5/1 blood >NTD  5/3 blood > NTD 5/3 resp > NTD 5/7 Blood>>> 5/7 Urine>>> 5/7 Sputum>>> 5/2 resp viral panel > rhinovirus 5/2 rapid strep throat swab > negative  ANTIBIOTICS: 5/1 ceftriaxone > 5/2 5/2 azithro > 5/2 5/3 vanc > 5/4>>>5/7>>>5/9 5/3 Mero > 5/4 >>>5/7>>>5/9 5/4 ceftriaxone > 5/7>>>5/9>>>  LINES/TUBES: 5/3 ETT >  5/3 L IJ CVL >  5/3 R radial arterial line >    SIGNIFICANT EVENTS: 5/3 move to ICU, intubated, paralyzed, prone 5/9 - No events overnight, no new complaints. EXTUBATED    SUBJECTIVE/OVERNIGHT/INTERVAL HX 12/15/17 -  On [place] but very tachycardic 160. Has agitation, confusion. Ongoing nausea and vomiting. => NG tube placed -> 4L Drainage now bloody this AM. Having some dirrhea.  Low grade fever +, WBC +. Has percocet at home  VITAL SIGNS: BP 110/76 (BP Location: Left Arm)   Pulse (!) 148   Temp 99.9 F (37.7 C) (Oral)   Resp (!) 24   Ht 5' 2"  (1.575 m)   Wt 61.5 kg (135 lb 9.3 oz)   LMP 12/03/2017 (Exact Date)   SpO2 99%   BMI 24.80 kg/m     General Appearance:    Looks criticall ill . Thin. Off vent  Head:    Normocephalic, without obvious abnormality, atraumatic  Eyes:    PERRL - yes, conjunctiva/corneas - clear      Ears:    Normal external ear canals, both ears  Nose:   NG tube - YES and had with blood in  NG tube   Throat:  ETT TUBE - no , OG tube - no  Neck:   Supple,  No enlargement/tenderness/nodules     Lungs:     Clear to auscultation bilaterally,   Chest wall:    No deformity  Heart:    S1 and S2 normal, no murmur, CVP - no.  Pressors - no  Abdomen:     Soft, no masses, no organomegaly  Genitalia:    Not done  Rectal:   not done  Extremities:   Extremities- intact     Skin:   Intact in exposed areas . Sacral area - no decub reproted by RN     Neurologic:   Sedation - none -> RASS - +1 . Moves all 4s - yes. CAM-ICU - positive for delirium . Orientation - partial only     PULMONARY Recent Labs  Lab 12/09/17 0416 12/11/17 0943 12/12/17 0230 12/13/17 0249 12/14/17 0352  PHART 7.381 7.520* 7.476* 7.464* 7.347*  PCO2ART 34.9 48.3* 54.6* 53.6* 45.9  PO2ART 94.0 78.0* 95.0 114.0* 129.0*  HCO3 21.0 39.2* 40.0* 38.6* 25.2  TCO2 22 41* 42* 40* 27  O2SAT 98.0 96.0 97.0 99.0 99.0    CBC Recent Labs  Lab 12/13/17 0511 12/14/17 0335 12/15/17 0306  HGB 9.4* 8.2* 10.9*  HCT 32.7* 28.9*  35.6*  WBC 25.2* 21.1* 33.0*  PLT 530* 513* 882*    COAGULATION No results for input(s): INR in the last 168 hours.  CARDIAC   Recent Labs  Lab 12/08/17 1106  TROPONINI <0.03   No results for input(s): PROBNP in the last 168 hours.   CHEMISTRY Recent Labs  Lab 12/09/17 0412 12/09/17 1740  12/11/17 1928 12/12/17 0351 12/13/17 0511 12/14/17 0335 12/15/17 0306  NA 150*  --    < > 146* 147* 146* 143 146*  K 3.0*  --    < > 3.8 3.7 3.6 4.4 3.1*  CL 119*  --    < > 103 101 100* 110 97*  CO2 23  --    < > 35* 35* 34* 26 31  GLUCOSE 116*  --    < > 131* 151* 153* 143* 128*  BUN 13  --    < > 15 14 24* 20 22*  CREATININE 1.32*  --    < > 0.93 0.97 0.91 0.72 1.07*  CALCIUM 6.2*  --    < > 6.5* 7.2* 8.4* 8.6* 9.2  MG 2.1 2.1  --   --  2.1 2.2 2.3  --   PHOS 3.5 3.7  --   --  2.6 5.1* 4.1  --    < > = values in this interval not displayed.   Estimated Creatinine Clearance: 62.8  mL/min (A) (by C-G formula based on SCr of 1.07 mg/dL (H)).   LIVER No results for input(s): AST, ALT, ALKPHOS, BILITOT, PROT, ALBUMIN, INR in the last 168 hours.   INFECTIOUS Recent Labs  Lab 12/08/17 0952  LATICACIDVEN 0.6     ENDOCRINE CBG (last 3)  Recent Labs    12/14/17 0723 12/14/17 1233 12/14/17 1929  GLUCAP 135* 103* 108*         IMAGING x48h  - image(s) personally visualized  -   highlighted in bold Dg Chest Port 1 View  Result Date: 12/14/2017 CLINICAL DATA:  Check endotracheal tube placement EXAM: PORTABLE CHEST 1 VIEW COMPARISON:  12/13/2017 FINDINGS: Cardiac shadow is stable. Endotracheal tube and nasogastric catheter are noted in satisfactory position. Lungs are well aerated bilaterally. Considerable overlying artifact is seen. The vascular congestion seen previously has improved slightly in the interval from the prior exam. IMPRESSION: Mild improved vascular congestion. No new focal abnormality is noted. Electronically Signed   By: Inez Catalina M.D.   On: 12/14/2017 10:12   Dg Chest Port 1 View  Result Date: 12/13/2017 CLINICAL DATA:  37 year old female-evaluate endotracheal tube placement. EXAM: PORTABLE CHEST 1 VIEW COMPARISON:  12/13/2017 and prior radiographs FINDINGS: Cardiomediastinal silhouette is unchanged. Pulmonary vascular congestion again noted. Endotracheal tube is identified with tip extending into the RIGHT mainstem bronchus-recommend 3 cm retraction. NG tube enters stomach and LEFT IJ central venous catheter again noted with tip overlying the UPPER SVC. No other changes identified. There is no evidence of pneumothorax. IMPRESSION: 1. Endotracheal tube with tip extending into the RIGHT mainstem bronchus-recommend 3 cm retraction. No other significant change. Apolonio Schneiders, nurse for this patient, was notified of these results on 12/13/2017 at 1:05 p.m. Electronically Signed   By: Margarette Canada M.D.   On: 12/13/2017 13:11   Dg Abd Portable 1v  Result Date:  12/14/2017 CLINICAL DATA:  NG tube placement EXAM: PORTABLE ABDOMEN - 1 VIEW COMPARISON:  12/10/2017 FINDINGS: Enteric tube terminates in the proximal gastric body. Lung bases are clear. IMPRESSION: Enteric tube terminates in the proximal gastric body.  Electronically Signed   By: Julian Hy M.D.   On: 12/14/2017 23:52      HEMODYNAMICS:    VENTILATOR SETTINGS:    INTAKE / OUTPUT: I/O last 3 completed shifts: In: 3822.2 [P.O.:120; I.V.:972.2; NG/GT:1730; IV Piggyback:1000] Out: 7989 [QJJHE:1740; Emesis/NG output:3452]  PHYSICAL EXAMINATION: DISCUSSION: 37 y/o female with crohns, etoh abuse, chronic narcotic use (NO IVDU, chart incorrect) who has ARDS from either rhinovirus or aspiration pneumonitis.  Oxygenation improving slowly but agitation remains a major barrier  ASSESSMENT / PLAN:  PULMONARY A: ARDS > improving but still has severe acute lung disease requiring high vent suppot 5/5 Severe vent dissynchrony  12/15/2017 - remains off vent. Agitation an issue  P:   Titrate O2 for sat of 88-92%  CARDIOVASCULAR A:   Severe Tachycardia 12/15/2017 - probably opioid withdrawal, ? GI bleeding. Agitation, ? Volume depletion ? A flutter (reported lopressor at hiome)   P:  Tele Monitor hemodynamics 2L bolus EKG stat TEst dose fentanyl 51mg x 1   RENAL A:   Mild AKI 12/15/2017  P:   2L fluid bolus and reasesss   GASTROINTESTINAL A:   Crohns, history of abdominal surgery but per mother not on any chronic medications for this Chronic diarrhea, now with no bowel movements  12/15/2017 - UGI blood returns on NG tube  P:   No TF Keep NPO Start protonix GTT GI consult depending on course   HEMATOLOGIC A:   Anemia  12/15/2017 - > new UGI bleed + , BP ok but HR 160    P:  - PRBC for hgb </= 6.9gm%    - exceptions are   -  if ACS susepcted/confirmed then transfuse for hgb </= 8.0gm%,  or    -  active bleeding with hemodynamic instability, then transfuse  regardless of hemoglobin value   At at all times try to transfuse 1 unit prbc as possible with exception of active hemorrhage    INFECTIOUS A:   Severe CAP > from rhinovirus Aspiration pneumonitis possible Sinusitis/possible mastoiditis per CT scan Fever 5/5 due to the above, overall clinically improving despite this so doubt new infection   P:   D/C ceftriaxone Reculture today D/C merrem and vanc Rocephin IV  REcheck Lactate and PCT  ENDOCRINE A:   Mild Hyperglycemia P:   SSI CBGs  NEUROLOGIC A:   Need for heavy sedation in setting of severe ARDS Opioid dependence > does not use IV drugs EtOH use  12/15/2017 - ongoing agitatied delirium   P:   Start precedex gtt Thiamine/folate D/C versed  FAMILY  - Updates: No family bedside 5/9. No family at bedisde 12/15/17  - Inter-disciplinary family meet or Palliative Care meeting due by:  day 7      The patient is critically ill with multiple organ systems failure and requires high complexity decision making for assessment and support, frequent evaluation and titration of therapies, application of advanced monitoring technologies and extensive interpretation of multiple databases.   Critical Care Time devoted to patient care services described in this note is  30  Minutes. This time reflects time of care of this signee Dr MBrand Males This critical care time does not reflect procedure time, or teaching time or supervisory time of PA/NP/Med student/Med Resident etc but could involve care discussion time    Dr. MBrand Males M.D., FGlen Rose Medical CenterC.P Pulmonary and Critical Care Medicine Staff Physician CSouth HoustonPulmonary and Critical Care Pager: [phone] If no answer or between  15:00h - 7:00h: call [phone]  [phone]  12/15/2017 8:58 AM

## 2017-12-16 ENCOUNTER — Inpatient Hospital Stay (HOSPITAL_COMMUNITY): Payer: Medicaid Other

## 2017-12-16 LAB — CBC WITH DIFFERENTIAL/PLATELET
Basophils Absolute: 0 10*3/uL (ref 0.0–0.1)
Basophils Absolute: 0 10*3/uL (ref 0.0–0.1)
Basophils Absolute: 0.3 10*3/uL — ABNORMAL HIGH (ref 0.0–0.1)
Basophils Absolute: 0.3 10*3/uL — ABNORMAL HIGH (ref 0.0–0.1)
Basophils Relative: 0 %
Basophils Relative: 0 %
Basophils Relative: 1 %
Basophils Relative: 1 %
Eosinophils Absolute: 0 10*3/uL (ref 0.0–0.7)
Eosinophils Absolute: 0 10*3/uL (ref 0.0–0.7)
Eosinophils Absolute: 0.3 10*3/uL (ref 0.0–0.7)
Eosinophils Absolute: 0.3 10*3/uL (ref 0.0–0.7)
Eosinophils Relative: 0 %
Eosinophils Relative: 0 %
Eosinophils Relative: 1 %
Eosinophils Relative: 1 %
HCT: 28.3 % — ABNORMAL LOW (ref 36.0–46.0)
HCT: 27.9 % — ABNORMAL LOW (ref 36.0–46.0)
HCT: 27.3 % — ABNORMAL LOW (ref 36.0–46.0)
HCT: 26.9 % — ABNORMAL LOW (ref 36.0–46.0)
Hemoglobin: 7.9 g/dL — ABNORMAL LOW (ref 12.0–15.0)
Hemoglobin: 7.8 g/dL — ABNORMAL LOW (ref 12.0–15.0)
Hemoglobin: 7.5 g/dL — ABNORMAL LOW (ref 12.0–15.0)
Hemoglobin: 7.4 g/dL — ABNORMAL LOW (ref 12.0–15.0)
Lymphocytes Relative: 8 %
Lymphocytes Relative: 11 %
Lymphocytes Relative: 14 %
Lymphocytes Relative: 15 %
Lymphs Abs: 2.7 10*3/uL (ref 0.7–4.0)
Lymphs Abs: 3.4 10*3/uL (ref 0.7–4.0)
Lymphs Abs: 4.2 10*3/uL — ABNORMAL HIGH (ref 0.7–4.0)
Lymphs Abs: 3.8 10*3/uL (ref 0.7–4.0)
MCH: 24 pg — ABNORMAL LOW (ref 26.0–34.0)
MCH: 24.1 pg — ABNORMAL LOW (ref 26.0–34.0)
MCH: 23.7 pg — ABNORMAL LOW (ref 26.0–34.0)
MCH: 23.9 pg — ABNORMAL LOW (ref 26.0–34.0)
MCHC: 27.9 g/dL — ABNORMAL LOW (ref 30.0–36.0)
MCHC: 28 g/dL — ABNORMAL LOW (ref 30.0–36.0)
MCHC: 27.5 g/dL — ABNORMAL LOW (ref 30.0–36.0)
MCHC: 27.5 g/dL — ABNORMAL LOW (ref 30.0–36.0)
MCV: 86 fL (ref 78.0–100.0)
MCV: 86.1 fL (ref 78.0–100.0)
MCV: 86.4 fL (ref 78.0–100.0)
MCV: 87.1 fL (ref 78.0–100.0)
Monocytes Absolute: 3 10*3/uL — ABNORMAL HIGH (ref 0.1–1.0)
Monocytes Absolute: 2.2 10*3/uL — ABNORMAL HIGH (ref 0.1–1.0)
Monocytes Absolute: 1.8 10*3/uL — ABNORMAL HIGH (ref 0.1–1.0)
Monocytes Absolute: 1.8 10*3/uL — ABNORMAL HIGH (ref 0.1–1.0)
Monocytes Relative: 9 %
Monocytes Relative: 7 %
Monocytes Relative: 6 %
Monocytes Relative: 7 %
Neutro Abs: 27.6 10*3/uL — ABNORMAL HIGH (ref 1.7–7.7)
Neutro Abs: 25.6 10*3/uL — ABNORMAL HIGH (ref 1.7–7.7)
Neutro Abs: 23.2 10*3/uL — ABNORMAL HIGH (ref 1.7–7.7)
Neutro Abs: 19.1 10*3/uL — ABNORMAL HIGH (ref 1.7–7.7)
Neutrophils Relative %: 83 %
Neutrophils Relative %: 82 %
Neutrophils Relative %: 78 %
Neutrophils Relative %: 76 %
Platelets: 728 10*3/uL — ABNORMAL HIGH (ref 150–400)
Platelets: 721 10*3/uL — ABNORMAL HIGH (ref 150–400)
Platelets: 716 10*3/uL — ABNORMAL HIGH (ref 150–400)
Platelets: 760 10*3/uL — ABNORMAL HIGH (ref 150–400)
RBC: 3.29 MIL/uL — ABNORMAL LOW (ref 3.87–5.11)
RBC: 3.24 MIL/uL — ABNORMAL LOW (ref 3.87–5.11)
RBC: 3.16 MIL/uL — ABNORMAL LOW (ref 3.87–5.11)
RBC: 3.09 MIL/uL — ABNORMAL LOW (ref 3.87–5.11)
RDW: 19.2 % — ABNORMAL HIGH (ref 11.5–15.5)
RDW: 19.2 % — ABNORMAL HIGH (ref 11.5–15.5)
RDW: 19.4 % — ABNORMAL HIGH (ref 11.5–15.5)
RDW: 19.5 % — ABNORMAL HIGH (ref 11.5–15.5)
WBC: 33.3 10*3/uL — ABNORMAL HIGH (ref 4.0–10.5)
WBC: 31.2 10*3/uL — ABNORMAL HIGH (ref 4.0–10.5)
WBC: 29.8 10*3/uL — ABNORMAL HIGH (ref 4.0–10.5)
WBC: 25.3 10*3/uL — ABNORMAL HIGH (ref 4.0–10.5)

## 2017-12-16 LAB — GLUCOSE, CAPILLARY: Glucose-Capillary: 94 mg/dL (ref 65–99)

## 2017-12-16 LAB — BASIC METABOLIC PANEL
Anion gap: 9 (ref 5–15)
Anion gap: 8 (ref 5–15)
Anion gap: 10 (ref 5–15)
BUN: 23 mg/dL — ABNORMAL HIGH (ref 6–20)
BUN: 20 mg/dL (ref 6–20)
BUN: 19 mg/dL (ref 6–20)
CO2: 39 mmol/L — ABNORMAL HIGH (ref 22–32)
CO2: 36 mmol/L — ABNORMAL HIGH (ref 22–32)
CO2: 34 mmol/L — ABNORMAL HIGH (ref 22–32)
Calcium: 8.2 mg/dL — ABNORMAL LOW (ref 8.9–10.3)
Calcium: 8.2 mg/dL — ABNORMAL LOW (ref 8.9–10.3)
Calcium: 8.2 mg/dL — ABNORMAL LOW (ref 8.9–10.3)
Chloride: 103 mmol/L (ref 101–111)
Chloride: 107 mmol/L (ref 101–111)
Chloride: 105 mmol/L (ref 101–111)
Creatinine, Ser: 1.16 mg/dL — ABNORMAL HIGH (ref 0.44–1.00)
Creatinine, Ser: 1.05 mg/dL — ABNORMAL HIGH (ref 0.44–1.00)
Creatinine, Ser: 1.04 mg/dL — ABNORMAL HIGH (ref 0.44–1.00)
GFR calc Af Amer: >60 mL/min (ref >60)
GFR calc Af Amer: >60 mL/min (ref >60)
GFR calc Af Amer: >60 mL/min (ref >60)
GFR calc non Af Amer: 60 mL/min — ABNORMAL LOW (ref >60)
GFR calc non Af Amer: >60 mL/min (ref >60)
GFR calc non Af Amer: >60 mL/min (ref >60)
Glucose, Bld: 140 mg/dL — ABNORMAL HIGH (ref 65–99)
Glucose, Bld: 125 mg/dL — ABNORMAL HIGH (ref 65–99)
Glucose, Bld: 123 mg/dL — ABNORMAL HIGH (ref 65–99)
Potassium: 3.6 mmol/L (ref 3.5–5.1)
Potassium: 3.6 mmol/L (ref 3.5–5.1)
Potassium: 3.6 mmol/L (ref 3.5–5.1)
Sodium: 151 mmol/L — ABNORMAL HIGH (ref 135–145)
Sodium: 151 mmol/L — ABNORMAL HIGH (ref 135–145)
Sodium: 149 mmol/L — ABNORMAL HIGH (ref 135–145)

## 2017-12-16 LAB — PHOSPHORUS: Phosphorus: 2.7 mg/dL (ref 2.5–4.6)

## 2017-12-16 LAB — HEPATIC FUNCTION PANEL
ALT: 46 U/L (ref 14–54)
AST: 20 U/L (ref 15–41)
Albumin: 2.1 g/dL — ABNORMAL LOW (ref 3.5–5.0)
Alkaline Phosphatase: 146 U/L — ABNORMAL HIGH (ref 38–126)
Bilirubin, Direct: <0.1 mg/dL — ABNORMAL LOW (ref 0.1–0.5)
Total Bilirubin: 0.4 mg/dL (ref 0.3–1.2)
Total Protein: 5.5 g/dL — ABNORMAL LOW (ref 6.5–8.1)

## 2017-12-16 LAB — PROCALCITONIN: Procalcitonin: 0.5 ng/mL

## 2017-12-16 LAB — MAGNESIUM: Magnesium: 2.5 mg/dL — ABNORMAL HIGH (ref 1.7–2.4)

## 2017-12-16 IMAGING — DX DG CHEST 1V PORT
1 series · 1 of 1 positions shown · non-contrast
Comparison: [DATE]

CLINICAL DATA: OURARI respiratory distress syndrome

EXAM:
PORTABLE CHEST 1 VIEW

[chest ap]
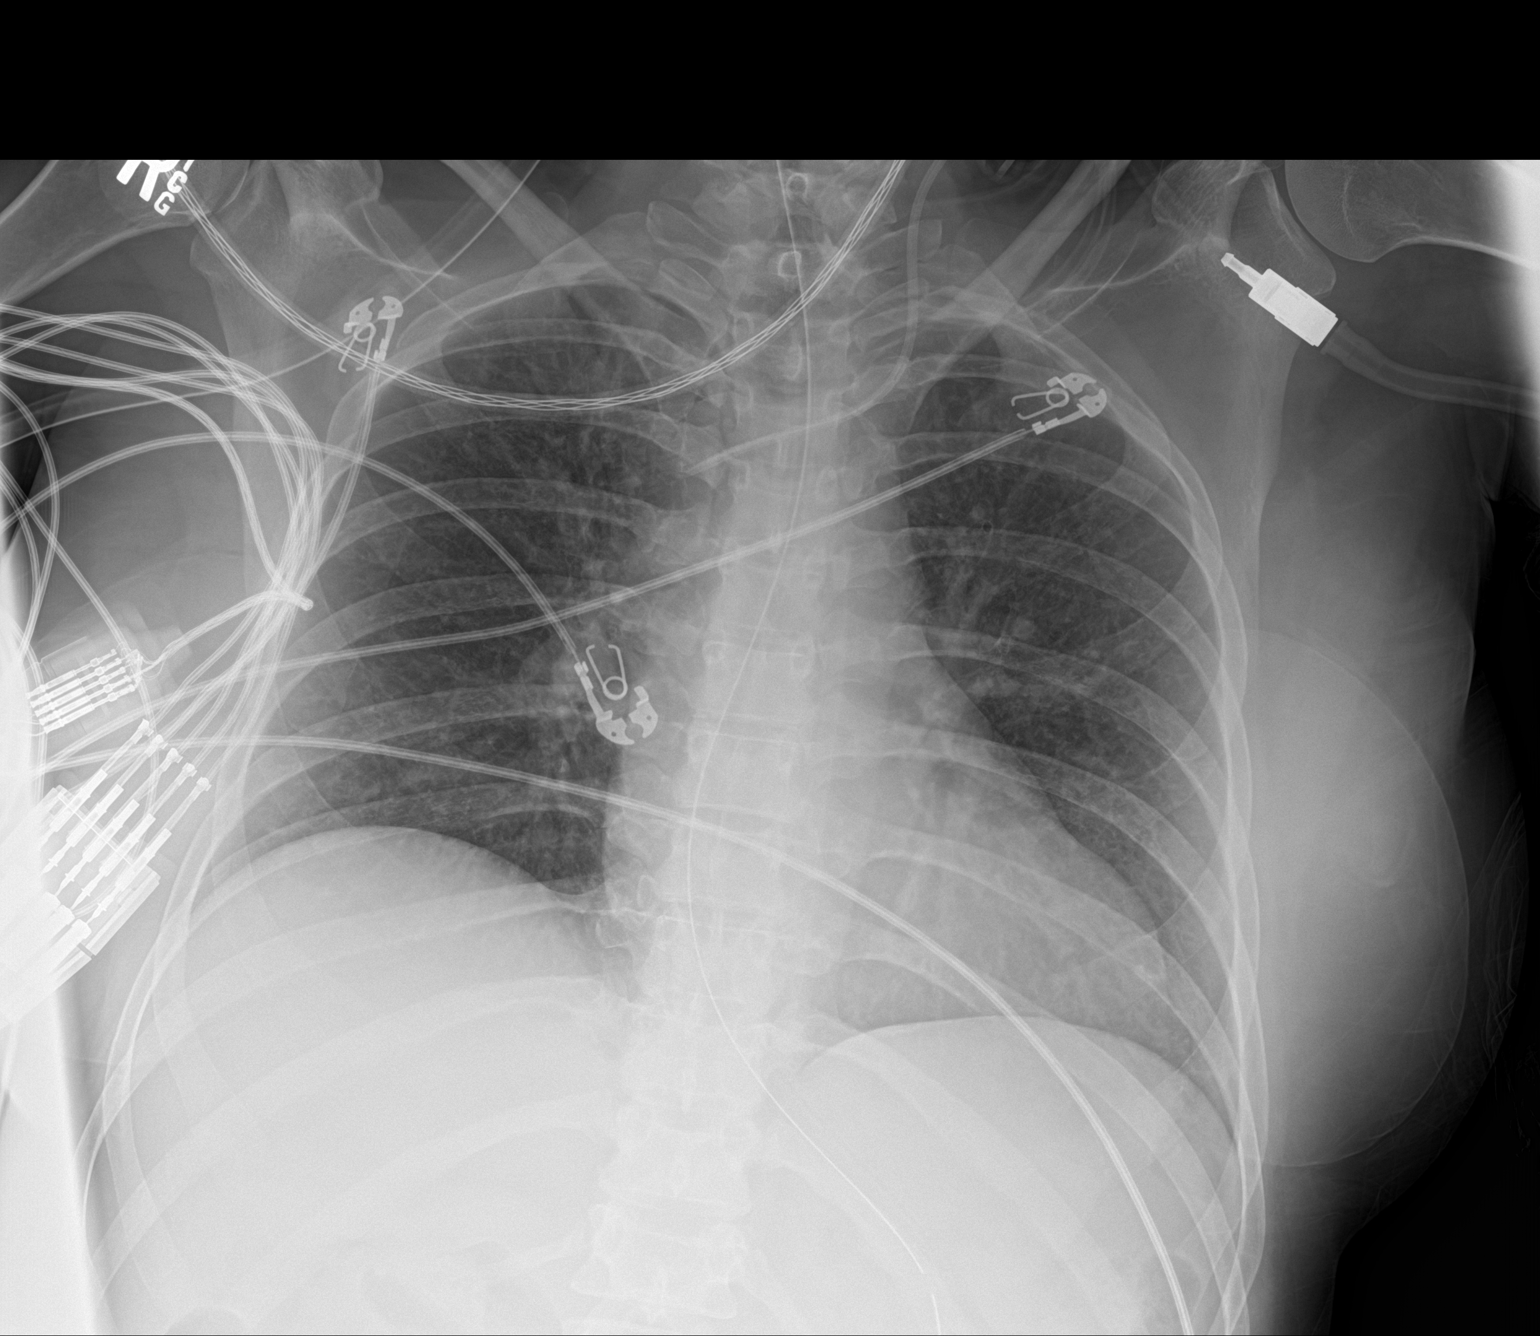

[1 of 1 positions shown; findings below may reference images not displayed]

FINDINGS: The heart size and mediastinal contours are within normal limits.
Left central venous line is identified distal tip in the superior
vena cava. A nasogastric tube is noted with distal tip not included
on film but is at least in the stomach. Both lungs are clear. The
visualized skeletal structures are unremarkable.
IMPRESSION: No active cardiopulmonary disease.

## 2017-12-16 MED ORDER — JEVITY 1.2 CAL PO LIQD
1000.0000 mL | ORAL | Status: DC
Start: 2017-12-16 — End: 2017-12-17
  Filled 2017-12-16 (×3): qty 1000

## 2017-12-16 MED ORDER — POTASSIUM CHLORIDE 10 MEQ/50ML IV SOLN
10.0000 meq | INTRAVENOUS | Status: AC
  Administered 2017-12-16 (×2): 10 meq via INTRAVENOUS
  Filled 2017-12-16 (×2): qty 50

## 2017-12-16 NOTE — Progress Notes (Signed)
[facility] ADULT ICU REPLACEMENT PROTOCOL FOR AM LAB REPLACEMENT ONLY  The patient does apply for the [facility] Adult ICU Electrolyte Replacment Protocol based on the criteria listed below:   1. Is GFR >/= 40 ml/min? Yes.    Patient's GFR today is >60 2. Is urine output >/= 0.5 ml/kg/hr for the last 6 hours? Yes.   Patient's UOP is .6 ml/kg/hr 3. Is BUN < 60 mg/dL? Yes.    Patient's BUN today is 20 4. Abnormal electrolyte(s): K- 3.6 5. Ordered repletion with: per protocol 6. If a panic level lab has been reported, has the CCM MD in charge been notified? Yes.  .   Physician:  Dr. Harlene Ramus, [place] 12/16/2017 4:59 AM

## 2017-12-16 NOTE — Progress Notes (Signed)
   PM rerounds  Off precedex Stable ENT does not think surgery indicated Vials stable Still needs restraints and some mild delirum +  Plan Move to sdu Will ask TR[facility] - Dr Thereasa Solo to e primary 12/17/17   Dr. Brand Males, M.D., [facility].C.P Pulmonary and Critical Care Medicine Staff Physician, [place] Director - Interstitial Lung Disease  Program  Pulmonary [place] at [place], [place], [address]  Pager: [phone], If no answer or between  15:00h - 7:00h: call [phone] Telephone: [phone]

## 2017-12-16 NOTE — Progress Notes (Signed)
Nutrition Follow-up  DOCUMENTATION CODES:  Not applicable  INTERVENTION:  Re Initiate TF via ngt with jevity 1.2 at goal rate of 55 ml/h (1320 ml per day) and to provide 1584 kcals, 73 gm protein, 1065 ml free water daily.  NUTRITION DIAGNOSIS:  Inadequate oral intake related to inability to eat as evidenced by NPO status.  GOAL:  Patient will meet greater than or equal to 90% of their needs  MONITOR:  Vent status, TF tolerance, Labs, I & O's  REASON FOR ASSESSMENT:  Consult Enteral/tube feeding initiation and management  ASSESSMENT:  37 yo female with PMH of vitamin B1 deficiency, B12 deficiency, anxiety, asthma, IVDU, smoker, ADHD, HTN, GERD, Crohn's disease, fibromyalgia, and osteoarthritis who was admitted on 5/1 with PNA and sepsis. Required intubation on 5/3 due to agitation and hypoxia.  RD reconsulted to restart TF. Per chart, TF has been off since 5/8 after patient self extubated. She then developed severe vomiting the night of 5/9,  requiring placement of NGT for suction which returned blood. No enteral intake x3 days.  Patient is still confused. Does not speak. Mother at bedside. Reports patient had BM this am.   No significant acute process at this time. She is remaining in ICU due to her delirium. Will switch to polymeric formula, fiber containing-she is not exhibiting any crohns related symptoms at this time.   Bedweight today, 60.5 kg, was slightly lower then current dry weight.   Labs: BH:419-379, WBC:31.2. Albumin 2.1, Mag 2.5, BG:123-140 Meds: Folate (not given x2d). On IVF/PPI infusion, precedex, D5  Recent Labs  Lab 12/13/17 0511 12/14/17 0335  12/15/17 2326 12/16/17 0353 12/16/17 0605  NA 146* 143   < > 151* 151* 149*  K 3.6 4.4   < > 3.6 3.6 3.6  CL 100* 110   < > 103 107 105  CO2 34* 26   < > 39* 36* 34*  BUN 24* 20   < > 23* 20 19  CREATININE 0.91 0.72   < > 1.16* 1.05* 1.04*  CALCIUM 8.4* 8.6*   < > 8.2* 8.2* 8.2*  MG 2.2 2.3  --   --  2.5*   --   PHOS 5.1* 4.1  --   --  2.7  --   GLUCOSE 153* 143*   < > 140* 125* 123*   < > = values in this interval not displayed.   Diet Order:   Diet Order           Diet NPO time specified Except for: Sips with Meds  Diet effective now         EDUCATION NEEDS:  No education needs have been identified at this time  Skin:  Skin Assessment: Reviewed RN Assessment  Last BM:  5/6  Height:  Ht Readings from Last 1 Encounters:  12/08/17 5' 2"  (1.575 m)   Weight:  Wt Readings from Last 1 Encounters:  12/16/17 133 lb 6.1 oz (60.5 kg)   Ideal Body Weight:  50 kg  BMI:  Body mass index is 24.4 kg/m.  Estimated Nutritional Needs:  Kcal:  1500-1700 kcals (25-28 kcal/kg bw) Protein:  73-85g Pro (1.2-1.4 g/kg bw) Fluid:  >2.1 L (35 ml/kg bw)  Burtis Junes RD, LDN, CNSC Clinical Nutrition Available Tues-Sat via Pager: [phone] 12/16/2017 11:13 AM

## 2017-12-16 NOTE — Progress Notes (Signed)
SLP Cancellation Note  Patient Details Name: [name] MRN: [id number] DOB: 14-Dec-1980   Cancelled treatment:       Reason Eval/Treat Not Completed: Medical issues which prohibited therapy. Bloody drainage continues via NG; will hold swallowing evaluation at this time. D/w RN. Will continue to follow.  Deneise Lever, [place], [place] Speech-Language Pathologist [place] 12/16/2017, 8:40 AM

## 2017-12-16 NOTE — Consult Note (Signed)
Reason for Consult: Sinusitis, ear infection Referring Physician: CCM  [name] is an 37 y.o. female.  HPI: 37 year old female with Crohn's disease and history of IV drug use developed URI symptoms a few days prior to admission including muffled hearing and ear pain.  She presented to the ER with chest pain and difficulty breathing and was vomiting and having diarrhea.  She is not currently taking medications for Crohn's disease.  She was admitted with an elevated WBC and signs of sepsis.  She was started on IV steroids, azithromycin, and ceftriaxone.  On hospital day 2, she became more hypoxic and confused and required intubation and was treated for ARDS.  Antibiotics were narrowed to ceftriaxone only and then merrem and vancomycin were added for a few days before she was reduced again to ceftriaxone.  Infectious disease was consulted.  She remained on the ventilator until 5/9.  A maxillofacial CT scan done on 5/2 demonstrated sinusitis and otomastoiditis.  Consult was requested.  Past Medical History:  Diagnosis Date  . Abdominal pain, unspecified site 03/24/2009  . 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 ADHD 12/02/2009  . Allergic rhinitis, cause unspecified 01/21/2011  . ANXIETY 03/24/2009  . B12 DEFICIENCY 04/28/2009  . Bronchitis 12/2017  . BURSITIS, RIGHT KNEE 02/05/2010  . Cellulitis and abscess of leg, except foot 02/05/2010  . Cervicalgia 12/02/2009  . COMMON MIGRAINE 02/05/2010  . [facility] INTESTINE 05/19/2009  . ECZEMA 05/20/2010  . Endometriosis 08/04/2011  . Fibromyalgia   . GERD 03/24/2009  . HEADACHE, CHRONIC 03/24/2009  . HYPERTENSION 03/24/2009  . Osteoarthritis   . OTITIS MEDIA, LEFT 08/12/2010  . SMOKER 12/02/2009  . Spine pain 01/21/2011   neck and thoracic spine  . VITAMIN B1 DEFICIENCY 09/21/2009  . Wheezing 08/12/2010    Past Surgical History:  Procedure Laterality Date  . BREAST ENHANCEMENT SURGERY  2004  . ENDOMETRIAL ABLATION  01/2009   Thinks laproscopic with  possible transvaginal  . RECTAL PROLAPSE REPAIR      Family History  Problem Relation Age of Onset  . Cancer Mother        breast  . Clotting disorder Mother   . Cancer Maternal Grandmother        Stomach Cancer  . Cancer Maternal Grandfather        Esophageal Cancer    Social History:  reports that she has been smoking cigarettes.  She has a 7.00 pack-year smoking history. She has never used smokeless tobacco. She reports that she drinks alcohol. She reports that she has current or past drug history. Drug: Other-see comments.  Allergies:  Allergies  Allergen Reactions  . Penicillins Hives    Has patient had a PCN reaction causing immediate rash, facial/tongue/throat swelling, SOB or lightheadedness with hypotension: Yes Has patient had a PCN reaction causing severe rash involving mucus membranes or skin necrosis: Unk Has patient had a PCN reaction that required hospitalization: Yes Has patient had a PCN reaction occurring within the last 10 years: No If all of the above answers are "NO", then may proceed with Cephalosporin use.   Marland Kitchen Doxycycline Other (See Comments)    Patient cannot recall the reaction    Medications: I have reviewed the patient's current medications.  Results for orders placed or performed during the hospital encounter of 12/06/17 (from the past 48 hour(s))  Glucose, capillary     Status: Abnormal   Collection Time: 12/14/17 12:33 PM  Result Value Ref Range   Glucose-Capillary 103 (H)  65 - 99 mg/dL   Comment 1 Capillary Specimen   Glucose, capillary     Status: Abnormal   Collection Time: 12/14/17  7:29 PM  Result Value Ref Range   Glucose-Capillary 108 (H) 65 - 99 mg/dL   Comment 1 Notify RN   CBC     Status: Abnormal   Collection Time: 12/15/17  3:06 AM  Result Value Ref Range   WBC 33.0 (H) 4.0 - 10.5 K/uL   RBC 4.40 3.87 - 5.11 MIL/uL   Hemoglobin 10.9 (L) 12.0 - 15.0 g/dL    Comment: REPEATED TO VERIFY DELTA CHECK NOTED    HCT 35.6 (L) 36.0  - 46.0 %   MCV 80.9 78.0 - 100.0 fL   MCH 24.8 (L) 26.0 - 34.0 pg   MCHC 30.6 30.0 - 36.0 g/dL   RDW 18.4 (H) 11.5 - 15.5 %   Platelets 882 (H) 150 - 400 K/uL    Comment: Performed at [facility], [place] [address] [place], [place] [address]  Basic metabolic panel     Status: Abnormal   Collection Time: 12/15/17  3:06 AM  Result Value Ref Range   Sodium 146 (H) 135 - 145 mmol/L   Potassium 3.1 (L) 3.5 - 5.1 mmol/L   Chloride 97 (L) 101 - 111 mmol/L   CO2 31 22 - 32 mmol/L   Glucose, Bld 128 (H) 65 - 99 mg/dL   BUN 22 (H) 6 - 20 mg/dL   Creatinine, Ser 1.07 (H) 0.44 - 1.00 mg/dL   Calcium 9.2 8.9 - 10.3 mg/dL   GFR calc non Af Amer >60 >60 mL/min   GFR calc Af Amer >60 >60 mL/min    Comment: (NOTE) The eGFR has been calculated using the CKD EPI equation. This calculation has not been validated in all clinical situations. eGFR's persistently <60 mL/min signify possible Chronic Kidney Disease.    Anion gap 18 (H) 5 - 15    Comment: Performed at [facility], [place] [address] [place], [place] [address]  Lactic acid, plasma     Status: Abnormal   Collection Time: 12/15/17  9:30 AM  Result Value Ref Range   Lactic Acid, Venous 2.3 (HH) 0.5 - 1.9 mmol/L    Comment: CRITICAL RESULT CALLED TO, READ BACK BY AND VERIFIED WITH: Mellody Memos RN @ 1226 ON 12/15/17 BY HTEMOCHE Performed at [facility], [place] [address] [place], [place] [address]   CBC with Differential/Platelet     Status: Abnormal   Collection Time: 12/15/17  9:33 AM  Result Value Ref Range   WBC 33.1 (H) 4.0 - 10.5 K/uL   RBC 4.65 3.87 - 5.11 MIL/uL   Hemoglobin 11.8 (L) 12.0 - 15.0 g/dL   HCT 38.7 36.0 - 46.0 %   MCV 83.2 78.0 - 100.0 fL   MCH 25.4 (L) 26.0 - 34.0 pg   MCHC 30.5 30.0 - 36.0 g/dL   RDW 18.9 (H) 11.5 - 15.5 %   Platelets 1,104 (HH) 150 - 400 K/uL    Comment: REPEATED TO VERIFY CRITICAL RESULT CALLED TO, READ BACK BY AND VERIFIED WITH: A MINER RN 1028 12/15/2017 BY A BENNETT     Neutrophils Relative % 78 %   Lymphocytes Relative 8 %   Monocytes Relative 7 %   Eosinophils Relative 0 %   Basophils Relative 0 %   Band Neutrophils 3 %   Metamyelocytes Relative 3 %   Myelocytes 1 %  Promyelocytes Relative 0 %   Blasts 0 %   nRBC 0 0 /100 WBC   Other 0 %   Neutro Abs 28.2 (H) 1.7 - 7.7 K/uL   Lymphs Abs 2.6 0.7 - 4.0 K/uL   Monocytes Absolute 2.3 (H) 0.1 - 1.0 K/uL   Eosinophils Absolute 0.0 0.0 - 0.7 K/uL   Basophils Absolute 0.0 0.0 - 0.1 K/uL   RBC Morphology POLYCHROMASIA PRESENT     Comment: TARGET CELLS   WBC Morphology MILD LEFT SHIFT (1-5% METAS, OCC MYELO, OCC BANDS)     Comment: VACUOLATED NEUTROPHILS Performed at [facility] [address], [place], [place] [address]   Procalcitonin - Baseline     Status: None   Collection Time: 12/15/17  9:33 AM  Result Value Ref Range   Procalcitonin 1.01 ng/mL    Comment:        Interpretation: PCT > 0.5 ng/mL and <= 2 ng/mL: Systemic infection (sepsis) is possible, but other conditions are known to elevate PCT as well. (NOTE)       Sepsis PCT Algorithm           Lower Respiratory Tract                                      Infection PCT Algorithm    ----------------------------     ----------------------------         PCT < 0.25 ng/mL                PCT < 0.10 ng/mL         Strongly encourage             Strongly discourage   discontinuation of antibiotics    initiation of antibiotics    ----------------------------     -----------------------------       PCT 0.25 - 0.50 ng/mL            PCT 0.10 - 0.25 ng/mL               OR       >80% decrease in PCT            Discourage initiation of                                            antibiotics      Encourage discontinuation           of antibiotics    ----------------------------     -----------------------------         PCT >= 0.50 ng/mL              PCT 0.26 - 0.50 ng/mL                AND       <80% decrease in PCT             Encourage  initiation of                                             antibiotics       Encourage continuation           of antibiotics    ----------------------------     -----------------------------  PCT >= 0.50 ng/mL                  PCT > 0.50 ng/mL               AND         increase in PCT                  Strongly encourage                                      initiation of antibiotics    Strongly encourage escalation           of antibiotics                                     -----------------------------                                           PCT <= 0.25 ng/mL                                                 OR                                        > 80% decrease in PCT                                     Discontinue / Do not initiate                                             antibiotics Performed at [facility], [address] [place], [place] [address]   Type and screen [place]     Status: None   Collection Time: 12/15/17  9:50 AM  Result Value Ref Range   ABO/RH(D) O POS    Antibody Screen NEG    Sample Expiration      12/18/2017 Performed at [facility], [place] [address], [place], [place] [address]   CBC with Differential/Platelet     Status: Abnormal   Collection Time: 12/15/17  3:11 PM  Result Value Ref Range   WBC 31.7 (H) 4.0 - 10.5 K/uL   RBC 3.50 (L) 3.87 - 5.11 MIL/uL   Hemoglobin 8.4 (L) 12.0 - 15.0 g/dL    Comment: REPEATED TO VERIFY   HCT 29.7 (L) 36.0 - 46.0 %   MCV 84.9 78.0 - 100.0 fL   MCH 24.0 (L) 26.0 - 34.0 pg   MCHC 28.3 (L) 30.0 - 36.0 g/dL   RDW 18.7 (H) 11.5 - 15.5 %   Platelets 739 (H) 150 - 400 K/uL   Neutrophils Relative % 84 %   Lymphocytes Relative 8 %   Monocytes Relative 8 %   Eosinophils Relative  0 %   Basophils Relative 0 %   Neutro Abs 26.7 (H) 1.7 - 7.7 K/uL   Lymphs Abs 2.5 0.7 - 4.0 K/uL   Monocytes Absolute 2.5 (H) 0.1 - 1.0 K/uL   Eosinophils Absolute 0.0 0.0 - 0.7 K/uL   Basophils  Absolute 0.0 0.0 - 0.1 K/uL   RBC Morphology TARGET CELLS    WBC Morphology ATYPICAL LYMPHOCYTES     Comment: Performed at [facility], [place] [address], [place], [place] [address]  Glucose, capillary     Status: Abnormal   Collection Time: 12/15/17  3:13 PM  Result Value Ref Range   Glucose-Capillary 122 (H) 65 - 99 mg/dL   Comment 1 Capillary Specimen   Basic metabolic panel     Status: Abnormal   Collection Time: 12/15/17  6:47 PM  Result Value Ref Range   Sodium 150 (H) 135 - 145 mmol/L   Potassium 2.8 (L) 3.5 - 5.1 mmol/L   Chloride 97 (L) 101 - 111 mmol/L   CO2 42 (H) 22 - 32 mmol/L   Glucose, Bld 123 (H) 65 - 99 mg/dL   BUN 26 (H) 6 - 20 mg/dL   Creatinine, Ser 1.26 (H) 0.44 - 1.00 mg/dL   Calcium 8.1 (L) 8.9 - 10.3 mg/dL   GFR calc non Af Amer 54 (L) >60 mL/min   GFR calc Af Amer >60 >60 mL/min    Comment: (NOTE) The eGFR has been calculated using the CKD EPI equation. This calculation has not been validated in all clinical situations. eGFR's persistently <60 mL/min signify possible Chronic Kidney Disease.    Anion gap 11 5 - 15    Comment: Performed at [facility] [address], [place], [place] [address]  CBC with Differential/Platelet     Status: Abnormal   Collection Time: 12/15/17  7:48 PM  Result Value Ref Range   WBC 34.6 (H) 4.0 - 10.5 K/uL   RBC 3.42 (L) 3.87 - 5.11 MIL/uL   Hemoglobin 8.2 (L) 12.0 - 15.0 g/dL   HCT 29.1 (L) 36.0 - 46.0 %   MCV 85.1 78.0 - 100.0 fL   MCH 24.0 (L) 26.0 - 34.0 pg   MCHC 28.2 (L) 30.0 - 36.0 g/dL   RDW 19.1 (H) 11.5 - 15.5 %   Platelets 727 (H) 150 - 400 K/uL   Neutrophils Relative % 83 %   Lymphocytes Relative 8 %   Monocytes Relative 9 %   Eosinophils Relative 0 %   Basophils Relative 0 %   Neutro Abs 28.7 (H) 1.7 - 7.7 K/uL   Lymphs Abs 2.8 0.7 - 4.0 K/uL   Monocytes Absolute 3.1 (H) 0.1 - 1.0 K/uL   Eosinophils Absolute 0.0 0.0 - 0.7 K/uL   Basophils Absolute 0.0 0.0 - 0.1 K/uL   RBC Morphology  POLYCHROMASIA PRESENT     Comment: PAPPENHEIMER BODIES STOMATOCYTES    Smear Review PLATELETS APPEAR INCREASED     Comment: Performed at [facility], [place] [address] [place], [place] [address]  Basic metabolic panel     Status: Abnormal   Collection Time: 12/15/17 11:26 PM  Result Value Ref Range   Sodium 151 (H) 135 - 145 mmol/L   Potassium 3.6 3.5 - 5.1 mmol/L   Chloride 103 101 - 111 mmol/L   CO2 39 (H) 22 - 32 mmol/L   Glucose, Bld 140 (H) 65 - 99 mg/dL   BUN 23 (H) 6 - 20 mg/dL  Creatinine, Ser 1.16 (H) 0.44 - 1.00 mg/dL   Calcium 8.2 (L) 8.9 - 10.3 mg/dL   GFR calc non Af Amer 60 (L) >60 mL/min   GFR calc Af Amer >60 >60 mL/min    Comment: (NOTE) The eGFR has been calculated using the CKD EPI equation. This calculation has not been validated in all clinical situations. eGFR's persistently <60 mL/min signify possible Chronic Kidney Disease.    Anion gap 9 5 - 15    Comment: Performed at [facility] [address], [place], [place] [address]  CBC with Differential/Platelet     Status: Abnormal   Collection Time: 12/16/17  2:24 AM  Result Value Ref Range   WBC 33.3 (H) 4.0 - 10.5 K/uL   RBC 3.29 (L) 3.87 - 5.11 MIL/uL   Hemoglobin 7.9 (L) 12.0 - 15.0 g/dL   HCT 28.3 (L) 36.0 - 46.0 %   MCV 86.0 78.0 - 100.0 fL   MCH 24.0 (L) 26.0 - 34.0 pg   MCHC 27.9 (L) 30.0 - 36.0 g/dL   RDW 19.2 (H) 11.5 - 15.5 %   Platelets 728 (H) 150 - 400 K/uL   Neutrophils Relative % 83 %   Lymphocytes Relative 8 %   Monocytes Relative 9 %   Eosinophils Relative 0 %   Basophils Relative 0 %   Neutro Abs 27.6 (H) 1.7 - 7.7 K/uL   Lymphs Abs 2.7 0.7 - 4.0 K/uL   Monocytes Absolute 3.0 (H) 0.1 - 1.0 K/uL   Eosinophils Absolute 0.0 0.0 - 0.7 K/uL   Basophils Absolute 0.0 0.0 - 0.1 K/uL   RBC Morphology POLYCHROMASIA PRESENT     Comment: STOMATOCYTES   WBC Morphology ATYPICAL LYMPHOCYTES     Comment: Performed at [facility], [place] [address], [place], [place] [address]   BMET in AM     Status: Abnormal   Collection Time: 12/16/17  3:53 AM  Result Value Ref Range   Sodium 151 (H) 135 - 145 mmol/L   Potassium 3.6 3.5 - 5.1 mmol/L   Chloride 107 101 - 111 mmol/L   CO2 36 (H) 22 - 32 mmol/L   Glucose, Bld 125 (H) 65 - 99 mg/dL   BUN 20 6 - 20 mg/dL   Creatinine, Ser 1.05 (H) 0.44 - 1.00 mg/dL   Calcium 8.2 (L) 8.9 - 10.3 mg/dL   GFR calc non Af Amer >60 >60 mL/min   GFR calc Af Amer >60 >60 mL/min    Comment: (NOTE) The eGFR has been calculated using the CKD EPI equation. This calculation has not been validated in all clinical situations. eGFR's persistently <60 mL/min signify possible Chronic Kidney Disease.    Anion gap 8 5 - 15    Comment: Performed at [facility] [address] [place], [place] [address]  Magnesium     Status: Abnormal   Collection Time: 12/16/17  3:53 AM  Result Value Ref Range   Magnesium 2.5 (H) 1.7 - 2.4 mg/dL    Comment: Performed at [facility] [address] [place], [place] [address]  Phosphorus     Status: None   Collection Time: 12/16/17  3:53 AM  Result Value Ref Range   Phosphorus 2.7 2.5 - 4.6 mg/dL    Comment: Performed at [facility] [address] [place], [place] [address]  CBC with Differential/Platelet     Status: Abnormal   Collection Time: 12/16/17  3:53 AM  Result Value Ref Range   WBC 31.2 (H) 4.0 - 10.5 K/uL   RBC 3.24 (L) 3.87 - 5.11 MIL/uL   Hemoglobin 7.8 (L) 12.0 - 15.0 g/dL   HCT 27.9 (L) 36.0 - 46.0 %   MCV 86.1 78.0 - 100.0 fL   MCH 24.1 (L) 26.0 - 34.0 pg   MCHC 28.0 (L) 30.0 - 36.0 g/dL   RDW 19.2 (H) 11.5 - 15.5 %   Platelets 721 (H) 150 - 400 K/uL   Neutrophils Relative % 82 %   Lymphocytes Relative 11 %   Monocytes Relative 7 %   Eosinophils Relative 0 %   Basophils Relative 0 %   Neutro Abs 25.6 (H) 1.7 - 7.7 K/uL   Lymphs Abs 3.4 0.7 - 4.0 K/uL   Monocytes Absolute 2.2 (H) 0.1 - 1.0 K/uL   Eosinophils Absolute 0.0 0.0 - 0.7 K/uL   Basophils Absolute  0.0 0.0 - 0.1 K/uL   RBC Morphology POLYCHROMASIA PRESENT     Comment: STOMATOCYTES   WBC Morphology ATYPICAL LYMPHOCYTES     Comment: Performed at [facility], [address], [place], [place] [address]  Procalcitonin     Status: None   Collection Time: 12/16/17  3:53 AM  Result Value Ref Range   Procalcitonin 0.50 ng/mL    Comment:        Interpretation: PCT (Procalcitonin) <= 0.5 ng/mL: Systemic infection (sepsis) is not likely. Local bacterial infection is possible. (NOTE)       Sepsis PCT Algorithm           Lower Respiratory Tract                                      Infection PCT Algorithm    ----------------------------     ----------------------------         PCT < 0.25 ng/mL                PCT < 0.10 ng/mL         Strongly encourage             Strongly discourage   discontinuation of antibiotics    initiation of antibiotics    ----------------------------     -----------------------------       PCT 0.25 - 0.50 ng/mL            PCT 0.10 - 0.25 ng/mL               OR       >80% decrease in PCT            Discourage initiation of                                            antibiotics      Encourage discontinuation           of antibiotics    ----------------------------     -----------------------------         PCT >= 0.50 ng/mL              PCT 0.26 - 0.50 ng/mL               AND        <80% decrease in PCT  Encourage initiation of                                             antibiotics       Encourage continuation           of antibiotics    ----------------------------     -----------------------------        PCT >= 0.50 ng/mL                  PCT > 0.50 ng/mL               AND         increase in PCT                  Strongly encourage                                      initiation of antibiotics    Strongly encourage escalation           of antibiotics                                     -----------------------------                                            PCT <= 0.25 ng/mL                                                 OR                                        > 80% decrease in PCT                                     Discontinue / Do not initiate                                             antibiotics Performed at [facility], [address] [place], [place] [address]   Hepatic function panel     Status: Abnormal   Collection Time: 12/16/17  3:53 AM  Result Value Ref Range   Total Protein 5.5 (L) 6.5 - 8.1 g/dL   Albumin 2.1 (L) 3.5 - 5.0 g/dL   AST 20 15 - 41 U/L   ALT 46 14 - 54 U/L   Alkaline Phosphatase 146 (H) 38 - 126 U/L   Total Bilirubin 0.4 0.3 - 1.2 mg/dL   Bilirubin, Direct <0.1 (L) 0.1 - 0.5 mg/dL   Indirect Bilirubin NOT CALCULATED 0.3 - 0.9 mg/dL    Comment: Performed at [facility] [address]  [place], [place] [address]  BMET in AM     Status: Abnormal   Collection Time: 12/16/17  6:05 AM  Result Value Ref Range   Sodium 149 (H) 135 - 145 mmol/L   Potassium 3.6 3.5 - 5.1 mmol/L   Chloride 105 101 - 111 mmol/L   CO2 34 (H) 22 - 32 mmol/L   Glucose, Bld 123 (H) 65 - 99 mg/dL   BUN 19 6 - 20 mg/dL   Creatinine, Ser 1.04 (H) 0.44 - 1.00 mg/dL   Calcium 8.2 (L) 8.9 - 10.3 mg/dL   GFR calc non Af Amer >60 >60 mL/min   GFR calc Af Amer >60 >60 mL/min    Comment: (NOTE) The eGFR has been calculated using the CKD EPI equation. This calculation has not been validated in all clinical situations. eGFR's persistently <60 mL/min signify possible Chronic Kidney Disease.    Anion gap 10 5 - 15    Comment: Performed at [facility] [address] [place], [place] [address]    Dg Chest Port 1 View  Result Date: 12/16/2017 CLINICAL DATA:  Thal respiratory distress syndrome EXAM: PORTABLE CHEST 1 VIEW COMPARISON:  Dec 14, 2017 FINDINGS: The heart size and mediastinal contours are within normal limits. Left central venous line is identified distal tip in the superior vena cava. A  nasogastric tube is noted with distal tip not included on film but is at least in the stomach. Both lungs are clear. The visualized skeletal structures are unremarkable. IMPRESSION: No active cardiopulmonary disease. Electronically Signed   By: Abelardo Diesel M.D.   On: 12/16/2017 07:29   Dg Abd Portable 1v  Result Date: 12/14/2017 CLINICAL DATA:  NG tube placement EXAM: PORTABLE ABDOMEN - 1 VIEW COMPARISON:  12/10/2017 FINDINGS: Enteric tube terminates in the proximal gastric body. Lung bases are clear. IMPRESSION: Enteric tube terminates in the proximal gastric body. Electronically Signed   By: Julian Hy M.D.   On: 12/14/2017 23:52    Review of Systems  Unable to perform ROS: Mental status change   Blood pressure 106/67, pulse 90, temperature 99.2 F (37.3 C), temperature source Oral, resp. rate 18, height _0  (1.575 m), weight 133 lb 13.1 oz (60.7 kg), last menstrual period 12/03/2017, SpO2 97 %. Physical Exam  Constitutional: She appears well-developed and well-nourished. No distress.  HENT:  Head: Normocephalic and atraumatic.  Nose: Nose normal.  Mouth/Throat: Oropharynx is clear and moist.  Bilateral serous middle ear effusion.  Eyes: Pupils are equal, round, and reactive to light. Conjunctivae and EOM are normal.  Neck: Normal range of motion. Neck supple.  Cardiovascular: Normal rate.  Respiratory: Effort normal.  Neurological: She is alert. No cranial nerve deficit.  Delirious.  Skin: Skin is warm and dry.  Psychiatric:  Delirious.    Assessment/Plan: Acute pansinusitis and otomastoiditis  I personally reviewed her maxillofacial CT from 5/2 demonstrating typical findings of acute pansinusitis with fairly mild mastoid and middle ear effusion.  At this point, her middle ear fluid is sterile-appearing.  Her current situation does not require surgical intervention.  Finish antibiotic course per ID.  I would be surprised if her degree of illness/sepsis was solely due to  her sinus and ear findings.  [name] 12/16/2017, 9:57 AM

## 2017-12-16 NOTE — Progress Notes (Signed)
PULMONARY / CRITICAL CARE MEDICINE   Name: [name] MRN: [id number] DOB: 12-May-1981    ADMISSION DATE:  12/06/2017 CONSULTATION DATE:  12/08/17  REFERRING MD:  Lonny Prude  CHIEF COMPLAINT:  Dyspnea  BRIEF HISTORY OF PRESENT ILLNESS:   37 y/o female with crohn's, EtOH abuse, chronic narcotic use admitted with cough, chest pain dyspnea.  Developed ARDS either from aspiration pneumonitis or rhinovirus.     STUDIES:  5/2 ct abd> no acute process, bowel looks OK, mild gallbladder distention 5/2 CT sinus> generalized acute sinusitis with fluid levels, possible otomastoiditis 5/3 echo > LVEF 65-70, PA pressure 42 mm Hg  CULTURES: 5/1 blood >NTD  5/3 blood > NTD 5/3 resp > NTD 5/7 Blood>>> 5/7 Urine>>> 5/7 Sputum>>> 5/2 resp viral panel > rhinovirus 5/2 rapid strep throat swab > negative  ANTIBIOTICS: 5/1 ceftriaxone > 5/2 5/2 azithro > 5/2 5/3 vanc > 5/4>>>5/7>>>5/9 5/3 Mero > 5/4 >>>5/7>>>5/9 5/4 ceftriaxone > 5/7>>>5/9>>>  LINES/TUBES: 5/3 ETT >  5/3 L IJ CVL >  5/3 R radial arterial line >    SIGNIFICANT EVENTS: 5/3 move to ICU, intubated, paralyzed, prone 5/9 - No events overnight, no new complaints. EXTUBATED 12/15/17 -  On [place] but very tachycardic 160. Has agitation, confusion. Ongoing nausea and vomiting. => NG tube placed -> 4L Drainage now bloody this AM. Having some dirrhea.  Low grade fever +, WBC +. Has percocet at home   SUBJECTIVE/OVERNIGHT/INTERVAL HX 12/16/17 - afebril.e. High WBC. Got fluids and precedex and 1 dose opioid (on home opioiud) for sinus tach and HR 89. Still on precedex gtt and protonix gttt. -> BP/HR stble. Not on pressors. NG drainage more clear now. Did not need PRBC.  Stil confused ad restless and 0.6 precedex gtt   VITAL SIGNS: BP 119/82 (BP Location: Left Arm)   Pulse 95   Temp 99.4 F (37.4 C) (Oral)   Resp 18   Ht 5' 2"  (1.575 m)   Wt 60.7 kg (133 lb 13.1 oz)   LMP 12/03/2017 (Exact Date)   SpO2 96%   BMI 24.48 kg/m     General Appearance:    Looks deconditioned and alert  Head:    Normocephalic, without obvious abnormality, atraumatic  Eyes:    PERRL - yes, conjunctiva/corneas - clear      Ears:    Normal external ear canals, both ears  Nose:   NG tube - yes - more clear returns  Throat:  ETT TUBE - no , OG tube - no  Neck:   Supple,  No enlargement/tenderness/nodules     Lungs:     Clear to auscultation bilaterally,  Chest wall:    No deformity  Heart:    S1 and S2 normal, no murmur, CVP - no.  Pressors - no  Abdomen:     Soft, no masses, no organomegaly  Genitalia:    She is lying in bed and stool around genitalia with foley in place  Rectal:   not done  Extremities:   Extremities- intact     Skin:   Intact in exposed areas . Sacral area - no decub reported     Neurologic:   Sedation - precedex gtt -> RASS - +1 . Moves all 4s - yes. CAM-ICU - positive for delirium . Orientation - partial only     PULMONARY Recent Labs  Lab 12/11/17 0943 12/12/17 0230 12/13/17 0249 12/14/17 0352  PHART 7.520* 7.476* 7.464* 7.347*  PCO2ART 48.3* 54.6* 53.6* 45.9  PO2ART 78.0*  95.0 114.0* 129.0*  HCO3 39.2* 40.0* 38.6* 25.2  TCO2 41* 42* 40* 27  O2SAT 96.0 97.0 99.0 99.0    CBC Recent Labs  Lab 12/15/17 1948 12/16/17 0224 12/16/17 0353  HGB 8.2* 7.9* 7.8*  HCT 29.1* 28.3* 27.9*  WBC 34.6* 33.3* 31.2*  PLT 727* 728* 721*    COAGULATION No results for input(s): INR in the last 168 hours.  CARDIAC   No results for input(s): TROPONINI in the last 168 hours. No results for input(s): PROBNP in the last 168 hours.   CHEMISTRY Recent Labs  Lab 12/09/17 1740  12/12/17 0351 12/13/17 0160 12/14/17 0335 12/15/17 0306 12/15/17 1847 12/15/17 2326 12/16/17 0353 12/16/17 0605  NA  --    < > 147* 146* 143 146* 150* 151* 151* 149*  K  --    < > 3.7 3.6 4.4 3.1* 2.8* 3.6 3.6 3.6  CL  --    < > 101 100* 110 97* 97* 103 107 105  CO2  --    < > 35* 34* 26 31 42* 39* 36* 34*  GLUCOSE  --    <  > 151* 153* 143* 128* 123* 140* 125* 123*  BUN  --    < > 14 24* 20 22* 26* 23* 20 19  CREATININE  --    < > 0.97 0.91 0.72 1.07* 1.26* 1.16* 1.05* 1.04*  CALCIUM  --    < > 7.2* 8.4* 8.6* 9.2 8.1* 8.2* 8.2* 8.2*  MG 2.1  --  2.1 2.2 2.3  --   --   --  2.5*  --   PHOS 3.7  --  2.6 5.1* 4.1  --   --   --  2.7  --    < > = values in this interval not displayed.   Estimated Creatinine Clearance: 64.1 mL/min (A) (by C-G formula based on SCr of 1.04 mg/dL (H)).   LIVER Recent Labs  Lab 12/16/17 0353  AST 20  ALT 46  ALKPHOS 146*  BILITOT 0.4  PROT 5.5*  ALBUMIN 2.1*     INFECTIOUS Recent Labs  Lab 12/15/17 0930 12/15/17 0933 12/16/17 0353  LATICACIDVEN 2.3*  --   --   PROCALCITON  --  1.01 0.50     ENDOCRINE CBG (last 3)  Recent Labs    12/14/17 1233 12/14/17 1929 12/15/17 1513  GLUCAP 103* 108* 122*         IMAGING x48h  - image(s) personally visualized  -   highlighted in bold Dg Chest Port 1 View  Result Date: 12/16/2017 CLINICAL DATA:  Thal respiratory distress syndrome EXAM: PORTABLE CHEST 1 VIEW COMPARISON:  Dec 14, 2017 FINDINGS: The heart size and mediastinal contours are within normal limits. Left central venous line is identified distal tip in the superior vena cava. A nasogastric tube is noted with distal tip not included on film but is at least in the stomach. Both lungs are clear. The visualized skeletal structures are unremarkable. IMPRESSION: No active cardiopulmonary disease. Electronically Signed   By: Abelardo Diesel M.D.   On: 12/16/2017 07:29   Dg Abd Portable 1v  Result Date: 12/14/2017 CLINICAL DATA:  NG tube placement EXAM: PORTABLE ABDOMEN - 1 VIEW COMPARISON:  12/10/2017 FINDINGS: Enteric tube terminates in the proximal gastric body. Lung bases are clear. IMPRESSION: Enteric tube terminates in the proximal gastric body. Electronically Signed   By: Julian Hy M.D.   On: 12/14/2017 23:52      HEMODYNAMICS: CVP:  [  2 mmHg-4 mmHg] 4  mmHg  VENTILATOR SETTINGS:    INTAKE / OUTPUT: I/O last 3 completed shifts: In: 7005.4 [P.O.:120; I.V.:1895.4; NG/GT:90; IV Piggyback:4900] Out: 8490 [Urine:1965; Emesis/NG output:6475; Stool:50]  PHYSICAL EXAMINATION: DISCUSSION: 37 y/o female with crohns, etoh abuse, chronic narcotic use (NO IVDU, chart incorrect) who has ARDS from either rhinovirus or aspiration pneumonitis.  Oxygenation improving slowly but agitation remains a major barrier  ASSESSMENT / PLAN:  PULMONARY A: ARDS > improving but still has severe acute lung disease requiring high vent suppot 5/5 Severe vent dissynchrony  12/16/2017 - remains of vent, protecting airway  P:   Pulse ox > 88% Pulm toliet when possible Get CXR PRN  CARDIOVASCULAR A:   Severe Tachycardia 12/15/2017 - probably opioid withdrawal, ? GI bleeding. Agitation, ? Volume depletion ? A flutter (reported lopressor at hiome)  Improved HR 89 on 12/16/17 after fluids and precedex   P:  Monitor    RENAL A:   AKI resolved 12/16/17 - after s/p 4L LR Bolus Mild hypernatremia - improving  P:   d5 water at 33m/h    GASTROINTESTINAL A:   Crohns, history of abdominal surgery but per mother not on any chronic medications for this Chronic diarrhea, now with no bowel movements  12/15/2017 - UGI blood returns on NG tube -> started protonix gtt 12/16/17 - improved returns. Hgb stable  P:   Start Tube feeds 12/16/17 Continue protonix gtt since 12/15/17 - aim to go to IV schedule from 12/17/17    HEMATOLOGIC A:   Anemia  12/15/2017 - > new UGI bleed + , HGb drift ok.  P:  - PRBC for hgb </= 6.9gm%    - exceptions are   -  if ACS susepcted/confirmed then transfuse for hgb </= 8.0gm%,  or    -  active bleeding with hemodynamic instability, then transfuse regardless of hemoglobin value   At at all times try to transfuse 1 unit prbc as possible with exception of active hemorrhage      INFECTIOUS Recent Labs  Lab 12/15/17 0933  12/16/17 0353  PROCALCITON 1.01 0.50    A:   Severe Viral CAP > from rhinovirus 12/07/17 Biatleral Mastoiditis, SEvere acute sinusitis and left middle ear opacification on CT 12/07/17 Aspiration pneumonitis possible 12/08/17 resulting in intubation   12/16/17 - afebrile since 12/13/17 but wBC count very high. ID recommends ceftriaxone through 12/19/17   P:   Ceftriaxonae IV - continue through 12/19/17 ENT consult for mastoiditis and sinusitis esp given persistent high wbc - d/w DR BRedmond Baseman ENDOCRINE A:   Mild Hyperglycemia P:   SSI CBGs  NEUROLOGIC A:   Need for heavy sedation in setting of severe ARDS Opioid dependence > does not use IV drugs EtOH use  12/16/2017 -ongoing significant delirium but significantly better in 24h after fluids and precedex. BUt still   P:   Continue precedex gtt Thiamine/folate   FAMILY  - Updates: No family bedside 5/9. No family at bedisde 12/15/17. No family 12/16/17  - Inter-disciplinary family meet or Palliative Care meeting due by:  day 7  DISPO STill needs ICU due to delirium    The patient is critically ill with multiple organ systems failure and requires high complexity decision making for assessment and support, frequent evaluation and titration of therapies, application of advanced monitoring technologies and extensive interpretation of multiple databases.   Critical Care Time devoted to patient care services described in this note is  30  Minutes. This time reflects  time of care of this signee Dr Brand Males. This critical care time does not reflect procedure time, or teaching time or supervisory time of PA/NP/Med student/Med Resident etc but could involve care discussion time    Dr. Brand Males, M.D., [facility].C.P Pulmonary and Critical Care Medicine Staff Physician [place] [facility] Pager: [phone], If no answer or between  15:00h - 7:00h: call [phone]  12/16/2017 8:07  AM

## 2017-12-16 NOTE — Progress Notes (Signed)
Subjective: Brown liquid per NGT.  Objective: Vital signs in last 24 hours: Temp:  [98 F (36.7 C)-99.5 F (37.5 C)] 99.2 F (37.3 C) (05/11 0800) Pulse Rate:  [90-124] 102 (05/11 1000) Resp:  [13-26] 14 (05/11 1000) BP: (96-127)/(53-90) 127/90 (05/11 1000) SpO2:  [93 %-100 %] 97 % (05/11 1000) Weight:  [60.7 kg (133 lb 13.1 oz)] 60.7 kg (133 lb 13.1 oz) (05/11 0348) Weight change: -0.8 kg (-1 lb 12.2 oz) Last BM Date: 12/16/17  PE: GEN:   Awake but confused; NGT in place with brown liquid ABD:  Soft, non-tender  Lab Results: CBC    Component Value Date/Time   WBC 31.2 (H) 12/16/2017 0353   RBC 3.24 (L) 12/16/2017 0353   HGB 7.8 (L) 12/16/2017 0353   HCT 27.9 (L) 12/16/2017 0353   PLT 721 (H) 12/16/2017 0353   MCV 86.1 12/16/2017 0353   MCH 24.1 (L) 12/16/2017 0353   MCHC 28.0 (L) 12/16/2017 0353   RDW 19.2 (H) 12/16/2017 0353   LYMPHSABS 3.4 12/16/2017 0353   MONOABS 2.2 (H) 12/16/2017 0353   EOSABS 0.0 12/16/2017 0353   BASOSABS 0.0 12/16/2017 0353   CMP     Component Value Date/Time   NA 149 (H) 12/16/2017 0605   K 3.6 12/16/2017 0605   CL 105 12/16/2017 0605   CO2 34 (H) 12/16/2017 0605   GLUCOSE 123 (H) 12/16/2017 0605   BUN 19 12/16/2017 0605   CREATININE 1.04 (H) 12/16/2017 0605   CREATININE 0.87 11/05/2013 1244   CALCIUM 8.2 (L) 12/16/2017 0605   CALCIUM (LL) 10/22/2010 0448    5.9 QNS FOR REPEAT CRITICAL RESULT CALLED TO, READ BACK BY AND VERIFIED WITH: MARTIN,M. RN AT 1235 ON 10/25/10 BY GILLESPIE,B.   PROT 5.5 (L) 12/16/2017 0353   ALBUMIN 2.1 (L) 12/16/2017 0353   AST 20 12/16/2017 0353   ALT 46 12/16/2017 0353   ALKPHOS 146 (H) 12/16/2017 0353   BILITOT 0.4 12/16/2017 0353   GFRNONAA >60 12/16/2017 0605   GFRNONAA 88 11/05/2013 1244   GFRAA >60 12/16/2017 0605   GFRAA >89 11/05/2013 1244   Assessment:  1.   Acute respiratory distress, required intubation, now extubated. 2.  Crohn's disease, on no recent treatment. 3.  Confusion, since  admission. 4.  Brown effluent per NGT; do not think this is overt frank GI bleeding.  Plan:  1.  Continue supportive care, including PPI. 2.  Would not pursue endoscopy in absence of overt bleeding, which patient does not currently have. 3.  Will sign-off; please call with questions; thank you for the consultation.   Landry Dyke 12/16/2017, 11:04 AM   Cell [phone] If no answer or after 5 PM call [phone]

## 2017-12-16 NOTE — Progress Notes (Signed)
Physical Therapy Treatment Patient Details Name: [name] MRN: [id number] DOB: Jun 14, 1981 Today's Date: 12/16/2017    History of Present Illness Pt is a 37 y.o. female admitted 12/06/17 with cough, chest pain, hearing loss, N/V, and face/hands tingling. Worked up for bronchitis vs. undiagnosed COPD and sepsis. Pt developed ARDS either from aspiration pneumonitis or rhinovirus; intubated 5/3 and self-extubated 5/8. PMH includes Crohn's disease, HTN, otitis media, anxiety, migraines, GERD, fibromyalgia, endometriosis, tobacco abuse, IV drug use.    PT Comments    Pt received in bed with bilat wrist restraints. Safety mitts also applied at beginning of session. Pt agreeable to sitting EOB. Min assist required for supine to sit. Pt tolerated sitting x 3 minutes. Required return to supine due to nausea. Pt unable to follow directions for exercises in bed.   NG tube remains in place with bloody drainage. Pt nonverbal throughout session, occasionally shaking her head yes/no to answer questions. Pt also with dilated pupils and fixed stare. RN reports pt hasn't slept in 2 days. Discharge recommendation updated to SNF due to pt's continued complicated med status. PT to continue per POC with plan for progressing ambulation when pt able to tolerate.   Follow Up Recommendations  Supervision/Assistance - 24 hour;SNF     Equipment Recommendations  Other (comment)(TBD)    Recommendations for Other Services       Precautions / Restrictions Precautions Precautions: Fall;Other (comment) Precaution Comments: wrist restraints and mitts    Mobility  Bed Mobility Overal bed mobility: Needs Assistance Bed Mobility: Supine to Sit;Sit to Supine;Rolling Rolling: Min guard   Supine to sit: Min assist;HOB elevated Sit to supine: Min assist;HOB elevated   General bed mobility comments: cues for sequencing. Assist to elevate trunk.  Transfers                 General transfer comment: unable  due to nausea  Ambulation/Gait                 Stairs             Wheelchair Mobility    Modified Rankin (Stroke Patients Only)       Balance Overall balance assessment: Needs assistance Sitting-balance support: Bilateral upper extremity supported;Feet unsupported Sitting balance-Leahy Scale: Fair Sitting balance - Comments: Pt sat EOB x 3 minutes with min guard assist. Required return to bed due to nausea.                                     Cognition Arousal/Alertness: Awake/alert Behavior During Therapy: Flat affect Overall Cognitive Status: Difficult to assess                                 General Comments: Pt nonverbal this session. Occassionally shaking head yes or no.       Exercises      General Comments        Pertinent Vitals/Pain Pain Assessment: Faces Faces Pain Scale: No hurt    Home Living                      Prior Function            PT Goals (current goals can now be found in the care plan section) Acute Rehab PT Goals Patient Stated Goal: not stated PT Goal Formulation: With patient  Time For Goal Achievement: 12/28/17 Potential to Achieve Goals: Fair Progress towards PT goals: Not progressing toward goals - comment(mobility limited by nausea)    Frequency    Min 3X/week      PT Plan Discharge plan needs to be updated    Co-evaluation              AM-PAC PT "6 Clicks" Daily Activity  Outcome Measure  Difficulty turning over in bed (including adjusting bedclothes, sheets and blankets)?: A Little Difficulty moving from lying on back to sitting on the side of the bed? : A Lot Difficulty sitting down on and standing up from a chair with arms (e.g., wheelchair, bedside commode, etc,.)?: Unable Help needed moving to and from a bed to chair (including a wheelchair)?: A Lot Help needed walking in hospital room?: A Lot Help needed climbing 3-5 steps with a railing? : A Lot 6  Click Score: 12    End of Session Equipment Utilized During Treatment: Oxygen Activity Tolerance: Treatment limited secondary to medical complications (Comment)(nausea) Patient left: in bed;with call bell/phone within reach;with restraints reapplied;with bed alarm set Nurse Communication: Mobility status PT Visit Diagnosis: Other abnormalities of gait and mobility (R26.89);Difficulty in walking, not elsewhere classified (R26.2)     Time: 1093-2355 PT Time Calculation (min) (ACUTE ONLY): 13 min  Charges:  $Therapeutic Activity: 8-22 mins                    G Codes:       [name], PT  Office # [phone] Pager [phone]    [name] 12/16/2017, 1:22 PM

## 2017-12-16 NOTE — Progress Notes (Signed)
TF held due to 466ml's of brown drainage via NG. RN will continue to monitor.

## 2017-12-17 LAB — CULTURE, BLOOD (ROUTINE X 2)
Culture: NO GROWTH
Culture: NO GROWTH
Special Requests: ADEQUATE
Special Requests: ADEQUATE

## 2017-12-17 LAB — CBC WITH DIFFERENTIAL/PLATELET
Basophils Absolute: 0.2 10*3/uL — ABNORMAL HIGH (ref 0.0–0.1)
Basophils Relative: 1 %
Eosinophils Absolute: 0.2 10*3/uL (ref 0.0–0.7)
Eosinophils Relative: 1 %
HCT: 27.4 % — ABNORMAL LOW (ref 36.0–46.0)
Hemoglobin: 7.6 g/dL — ABNORMAL LOW (ref 12.0–15.0)
Lymphocytes Relative: 16 %
Lymphs Abs: 3.7 10*3/uL (ref 0.7–4.0)
MCH: 24.1 pg — ABNORMAL LOW (ref 26.0–34.0)
MCHC: 27.7 g/dL — ABNORMAL LOW (ref 30.0–36.0)
MCV: 87 fL (ref 78.0–100.0)
Monocytes Absolute: 1.6 10*3/uL — ABNORMAL HIGH (ref 0.1–1.0)
Monocytes Relative: 7 %
Neutro Abs: 17.3 10*3/uL — ABNORMAL HIGH (ref 1.7–7.7)
Neutrophils Relative %: 75 %
Platelets: 789 10*3/uL — ABNORMAL HIGH (ref 150–400)
RBC: 3.15 MIL/uL — ABNORMAL LOW (ref 3.87–5.11)
RDW: 19.8 % — ABNORMAL HIGH (ref 11.5–15.5)
WBC: 23 10*3/uL — ABNORMAL HIGH (ref 4.0–10.5)

## 2017-12-17 LAB — MAGNESIUM: Magnesium: 2.2 mg/dL (ref 1.7–2.4)

## 2017-12-17 LAB — PHOSPHORUS: Phosphorus: 3.3 mg/dL (ref 2.5–4.6)

## 2017-12-17 MED ORDER — FOLIC ACID 5 MG/ML IJ SOLN
1.0000 mg | Freq: Every day | INTRAMUSCULAR | Status: DC
  Administered 2017-12-17 – 2017-12-18 (×2): 1 mg via INTRAVENOUS
  Filled 2017-12-17 (×4): qty 0.2

## 2017-12-17 MED ORDER — QUETIAPINE FUMARATE 50 MG PO TABS
50.0000 mg | ORAL_TABLET | Freq: Every day | ORAL | Status: DC
  Administered 2017-12-17: 50 mg via ORAL
  Filled 2017-12-17: qty 1

## 2017-12-17 MED ORDER — METOCLOPRAMIDE HCL 5 MG/ML IJ SOLN
5.0000 mg | Freq: Four times a day (QID) | INTRAMUSCULAR | Status: AC
  Administered 2017-12-17 – 2017-12-18 (×4): 5 mg via INTRAVENOUS
  Filled 2017-12-17 (×4): qty 1

## 2017-12-17 MED ORDER — THIAMINE HCL 100 MG/ML IJ SOLN
100.0000 mg | Freq: Every day | INTRAMUSCULAR | Status: DC
  Administered 2017-12-17 – 2017-12-18 (×2): 100 mg via INTRAVENOUS
  Filled 2017-12-17 (×3): qty 1

## 2017-12-17 MED ORDER — METOCLOPRAMIDE HCL 5 MG/ML IJ SOLN: 5.0000 mg | Freq: Four times a day (QID) | INTRAMUSCULAR | Status: DC

## 2017-12-17 MED ORDER — FAMOTIDINE IN NACL 20-0.9 MG/50ML-% IV SOLN
20.0000 mg | Freq: Two times a day (BID) | INTRAVENOUS | Status: DC
  Administered 2017-12-17 – 2017-12-18 (×4): 20 mg via INTRAVENOUS
  Filled 2017-12-17 (×5): qty 50

## 2017-12-17 NOTE — Progress Notes (Signed)
SLP Cancellation Note  Patient Details Name: MISCHELE DETTER MRN: [id number] DOB: [birth year]   Cancelled treatment:       Reason Eval/Treat Not Completed: Medical issues which prohibited therapy per MD.   Juan Quam Laurice 12/17/2017, 10:34 AM

## 2017-12-17 NOTE — Progress Notes (Signed)
Inpatient Rehabilitation  Prt OT request, patient was screened by Gunnar Fusi for appropriateness for an Inpatient Acute Rehab consult.  At this time we are recommending an Inpatient Rehab consult.  Please order if you are agreeable.    Carmelia Roller., CCC/SLP Admission Coordinator  [place]  Cell [phone]

## 2017-12-17 NOTE — Progress Notes (Addendum)
[place] TEAM 1 - Stepdown/ICU TEAM  [name]  UKG:[id number] DOB: 02-11-1981 DOA: 12/06/2017 PCP: Patient, [name]    Brief Narrative:  37 y/o female with Crohn's, EtOH abuse, and chronic narcotic use who was admitted with cough, chest pain, and dyspnea.  She developed ARDS either from aspiration pneumonitis or rhinovirus.  Significant Events: 5/1 admit  5/3 moved to ICU - intubated, paralyzed, prone 5/9 extubated  5/10 NG > 4L bloody drainage  Subjective: Patient is alert but confused.  She is mildly agitated.  She denies chest pain or shortness of breath.  She reports low-grade nausea and dyspepsia.  Assessment & Plan:  ARDS - Severe Rhinovirus Pneumonitis c/b aspiration  Extubated 5/9 - cont rocephin through 5/14 per ID suggestion - now on RA   Acute Metabolic Encephalopathy  Likely multifactorial (substance withdrawal, ICU psychosis, etc) - check B12, folate, ammonia, RPR - seroquel QHS for sleep but otherwise attempt to avoid sedatives   Possible GI bleeding GI has seen and does not feel she has a signif GIB - no further w/u planned at this time   Normocytic anemia  Hgb holding steady - monitor  Recent Labs  Lab 12/15/17 1948 12/16/17 0224 12/16/17 0353 12/16/17 1450 12/16/17 2110 12/17/17 0537  HGB 8.2* 7.9* 7.8* 7.5* 7.4* 7.6*    AKI resolved w/ volume resuscitation   Hypernatremia  Increase free water and follow   Crohns - Chronic diarrhea  Acute pansinusitis and otomastoiditis ENT has seen - cont rocephin - no further intervention indicated   Opioid dependence Reportedly does not use IV drugs  EtOH use  DVT prophylaxis: SQ heparin  Code Status: FULL CODE Family Communication: spoke w/ mother at bedside   Disposition Plan: SDU  Consultants:  PCCM ENT GI ID  Antimicrobials:  5/1 ceftriaxone > 5/2 5/2 azithro > 5/2 5/3 vanc > 5/4>>>5/7>>>5/9 5/3 Mero > 5/4 >>>5/7>>>5/9 5/4 ceftriaxone > 5/7>>>5/9>>>  Objective: Blood  pressure (!) 158/91, pulse 70, temperature 98.6 F (37 C), temperature source Oral, resp. rate 20, height 5' 2"  (1.575 m), weight 58.4 kg (128 lb 12 oz), last menstrual period 12/03/2017, SpO2 95 %.  Intake/Output Summary (Last 24 hours) at 12/17/2017 0946 Last data filed at 12/17/2017 0700 Gross per 24 hour  Intake 1845 ml  Output 1025 ml  Net 820 ml   Filed Weights   12/16/17 0348 12/16/17 1111 12/17/17 0500  Weight: 60.7 kg (133 lb 13.1 oz) 60.5 kg (133 lb 6.1 oz) 58.4 kg (128 lb 12 oz)    Examination: General: No acute respiratory distress - alert but confused  Lungs: Clear to auscultation bilaterally without wheezes or crackles Cardiovascular: Regular rate and rhythm without murmur gallop or rub normal S1 and S2 Abdomen: Nontender, nondistended, soft, bowel sounds positive, no rebound, no ascites, no appreciable mass Extremities: No significant cyanosis, clubbing, or edema bilateral lower extremities  CBC: Recent Labs  Lab 12/16/17 1450 12/16/17 2110 12/17/17 0537  WBC 29.8* 25.3* 23.0*  NEUTROABS 23.2* 19.1* 17.3*  HGB 7.5* 7.4* 7.6*  HCT 27.3* 26.9* 27.4*  MCV 86.4 87.1 87.0  PLT 716* 760* 762*   Basic Metabolic Panel: Recent Labs  Lab 12/14/17 0335  12/15/17 2326 12/16/17 0353 12/16/17 0605 12/17/17 0537  NA 143   < > 151* 151* 149*  --   K 4.4   < > 3.6 3.6 3.6  --   CL 110   < > 103 107 105  --   CO2 26   < >  39* 36* 34*  --   GLUCOSE 143*   < > 140* 125* 123*  --   BUN 20   < > 23* 20 19  --   CREATININE 0.72   < > 1.16* 1.05* 1.04*  --   CALCIUM 8.6*   < > 8.2* 8.2* 8.2*  --   MG 2.3  --   --  2.5*  --  2.2  PHOS 4.1  --   --  2.7  --  3.3   < > = values in this interval not displayed.   GFR: Estimated Creatinine Clearance: 59.1 mL/min (A) (by C-G formula based on SCr of 1.04 mg/dL (H)).  Liver Function Tests: Recent Labs  Lab 12/16/17 0353  AST 20  ALT 46  ALKPHOS 146*  BILITOT 0.4  PROT 5.5*  ALBUMIN 2.1*   HbA1C: Hgb A1c MFr Bld    Date/Time Value Ref Range Status  10/10/2013 05:00 AM 5.4 <5.7 % Final    Comment:    (NOTE)                                                                       According to the ADA Clinical Practice Recommendations for 2011, when HbA1c is used as a screening test:  >=6.5%   Diagnostic of Diabetes Mellitus           (if abnormal result is confirmed) 5.7-6.4%   Increased risk of developing Diabetes Mellitus References:Diagnosis and Classification of Diabetes Mellitus,Diabetes Care,2011,34(Suppl 1):S62-S69 and Standards of Medical Care in         Diabetes - 2011,Diabetes FEOF,1219,75 (Suppl 1):S11-S61.  10/25/2010 04:59 AM (H) <5.7 % Final   5.7 (NOTE)                                                                       According to the ADA Clinical Practice Recommendations for 2011, when HbA1c is used as a screening test:   >=6.5%   Diagnostic of Diabetes Mellitus           (if abnormal result  is confirmed)  5.7-6.4%   Increased risk of developing Diabetes Mellitus  References:Diagnosis and Classification of Diabetes Mellitus,Diabetes OITG,5498,26(EBRAX 1):S62-S69 and Standards of Medical Care in         Diabetes - 2011,Diabetes Care,2011,34  (Suppl 1):S11-S61.    CBG: Recent Labs  Lab 12/14/17 0723 12/14/17 1233 12/14/17 1929 12/15/17 1513 12/16/17 2343  GLUCAP 135* 103* 108* 122* 94    Recent Results (from the past 240 hour(s))  Respiratory Panel by PCR     Status: Abnormal   Collection Time: 12/07/17  9:07 PM  Result Value Ref Range Status   Adenovirus NOT DETECTED NOT DETECTED Final   Coronavirus 229E NOT DETECTED NOT DETECTED Final   Coronavirus HKU1 NOT DETECTED NOT DETECTED Final   Coronavirus NL63 NOT DETECTED NOT DETECTED Final   Coronavirus OC43 NOT DETECTED NOT DETECTED Final   Metapneumovirus NOT DETECTED NOT DETECTED Final  Rhinovirus / Enterovirus DETECTED (A) NOT DETECTED Final   Influenza A NOT DETECTED NOT DETECTED Final   Influenza B NOT DETECTED NOT  DETECTED Final   Parainfluenza Virus 1 NOT DETECTED NOT DETECTED Final   Parainfluenza Virus 2 NOT DETECTED NOT DETECTED Final   Parainfluenza Virus 3 NOT DETECTED NOT DETECTED Final   Parainfluenza Virus 4 NOT DETECTED NOT DETECTED Final   Respiratory Syncytial Virus NOT DETECTED NOT DETECTED Final   Bordetella pertussis NOT DETECTED NOT DETECTED Final   Chlamydophila pneumoniae NOT DETECTED NOT DETECTED Final   Mycoplasma pneumoniae NOT DETECTED NOT DETECTED Final    Comment: Performed at [facility], [place] [address], [place], [place] [address]  Group A Strep by PCR     Status: None   Collection Time: 12/07/17  9:07 PM  Result Value Ref Range Status   Group A Strep by PCR NOT DETECTED NOT DETECTED Final    Comment: Performed at [facility], [place] [address], [place], [place] [address]  Culture, respiratory (tracheal aspirate)     Status: None   Collection Time: 12/08/17  7:25 AM  Result Value Ref Range Status   Specimen Description TRACHEAL ASPIRATE  Final   Special Requests NONE  Final   Gram Stain   Final    MODERATE WBC PRESENT, PREDOMINANTLY PMN NO ORGANISMS SEEN    Culture   Final    NO GROWTH 2 DAYS Performed at [facility], [place] [address], [place], [place] [address]    Report Status 12/10/2017 FINAL  Final  MRSA PCR Screening     Status: None   Collection Time: 12/08/17  7:41 AM  Result Value Ref Range Status   MRSA by PCR NEGATIVE NEGATIVE Final    Comment:        The GeneXpert MRSA Assay (FDA approved for NASAL specimens only), is one component of a comprehensive MRSA colonization surveillance program. It is not intended to diagnose MRSA infection nor to guide or monitor treatment for MRSA infections. Performed at [facility], [place] [address], [place], [place] [address]   Culture, blood (routine x 2)     Status: None   Collection Time: 12/08/17  7:47 AM  Result Value Ref Range Status   Specimen Description BLOOD RIGHT MIDLINE  Final     Special Requests   Final    BOTTLES DRAWN AEROBIC ONLY Blood Culture adequate volume   Culture   Final    NO GROWTH 5 DAYS Performed at [facility], [address], [place], [place] [address]    Report Status 12/13/2017 FINAL  Final  Culture, blood (routine x 2)     Status: None   Collection Time: 12/08/17  7:58 AM  Result Value Ref Range Status   Specimen Description BLOOD RIGHT HAND  Final   Special Requests   Final    BOTTLES DRAWN AEROBIC ONLY Blood Culture adequate volume   Culture   Final    NO GROWTH 5 DAYS Performed at [facility], [place] [address], [place], [place] [address]    Report Status 12/13/2017 FINAL  Final  Culture, expectorated sputum-assessment     Status: None   Collection Time: 12/12/17  8:38 AM  Result Value Ref Range Status   Specimen Description SPUTUM  Final   Special Requests NONE  Final   Sputum evaluation   Final    THIS SPECIMEN IS ACCEPTABLE FOR SPUTUM CULTURE Performed at [facility]  [facility], [place] [address], [place], [place] [address]    Report Status 12/12/2017 FINAL  Final  Urine Culture     Status: None   Collection Time: 12/12/17  8:38 AM  Result Value Ref Range Status   Specimen Description URINE, CATHETERIZED  Final   Special Requests NONE  Final   Culture   Final    NO GROWTH Performed at [facility], [place] [address], [place], [place] [address]    Report Status 12/13/2017 FINAL  Final  Culture, respiratory (NON-Expectorated)     Status: None   Collection Time: 12/12/17  8:38 AM  Result Value Ref Range Status   Specimen Description SPUTUM  Final   Special Requests NONE Reflexed from [id number]  Final   Gram Stain   Final    MODERATE WBC PRESENT, PREDOMINANTLY PMN NO ORGANISMS SEEN    Culture   Final    NO GROWTH 2 DAYS Performed at [facility], [place] [address], [place], [place] [address]    Report Status 12/14/2017 FINAL  Final  Culture, blood (Routine X 2) w Reflex to ID Panel     Status: None  (Preliminary result)   Collection Time: 12/12/17  9:09 AM  Result Value Ref Range Status   Specimen Description BLOOD LEFT ANTECUBITAL  Final   Special Requests   Final    BOTTLES DRAWN AEROBIC ONLY Blood Culture adequate volume   Culture   Final    NO GROWTH 4 DAYS Performed at [facility], [place] [address], [place], [place] [address]    Report Status PENDING  Incomplete  Culture, blood (Routine X 2) w Reflex to ID Panel     Status: None (Preliminary result)   Collection Time: 12/12/17  9:10 AM  Result Value Ref Range Status   Specimen Description BLOOD BLOOD RIGHT FOREARM  Final   Special Requests   Final    BOTTLES DRAWN AEROBIC ONLY Blood Culture adequate volume   Culture   Final    NO GROWTH 4 DAYS Performed at [facility], [place] [address] [place],  [address]    Report Status PENDING  Incomplete     Scheduled Meds: . budesonide (PULMICORT) nebulizer solution  0.5 mg Nebulization BID  . chlorhexidine gluconate (MEDLINE KIT)  15 mL Mouth Rinse BID  . Chlorhexidine Gluconate Cloth  6 each Topical Daily  . folic acid  1 mg Oral Daily  . heparin  5,000 Units Subcutaneous Q8H  . mouth rinse  15 mL Mouth Rinse QID  . [START ON 12/18/2017] pantoprazole  40 mg Intravenous Q12H  . sodium chloride flush  10-40 mL Intracatheter Q12H  . thiamine  100 mg Per Tube Daily   Continuous Infusions: . cefTRIAXone (ROCEPHIN)  IV Stopped (12/16/17 1600)  . dextrose 50 mL/hr at 12/16/17 1802  . feeding supplement (JEVITY 1.2 CAL) Stopped (12/16/17 1742)  . pantoprozole (PROTONIX) infusion 8 mg/hr (12/17/17 0303)     LOS: 10 days   Cherene Altes, MD [facility] Office  [phone] Pager - Text Page per Amion as per below:  On-Call/Text Page:      [email or web address]      password [email or web address]  If 7PM-7AM, please contact night-coverage [email or web address] Password [facility] 12/17/2017, 9:46 AM

## 2017-12-17 NOTE — Evaluation (Signed)
Occupational Therapy Evaluation Patient Details Name: [name] MRN: [id number] DOB: October 24, 1980 Today's Date: 12/17/2017    History of Present Illness Pt is a 37 y.o. female admitted 12/06/17 with cough, chest pain, hearing loss, N/V, and face/hands tingling. Worked up for bronchitis vs. undiagnosed COPD and sepsis. Pt developed ARDS either from aspiration pneumonitis or rhinovirus; intubated 5/3 and self-extubated 5/8. Potential upper GI bleed noted 5/10. PMH includes Crohn's disease, HTN, otitis media, anxiety, migraines, GERD, fibromyalgia, endometriosis, tobacco abuse, IV drug use.    Clinical Impression   PTA, pt was independent with ADL and functional mobility per chart. She was able to whisper her name, birth date, and today's date during OT evaluation. She demonstrates decreased attention, requires increased time for processing, and has difficulty following multi-step commands. She was able to complete LB ADL with min assist and simulated toilet transfers with min assist +2 for safety today taking only a few steps. Pt would benefit from continued OT services while admitted to improve independence and safety with ADL and functional mobility. Pt was very independent PTA and would benefit from intensive CIR level therapies post-acute D/C to maximize return to PLOF. OT will continue to follow while admitted.     Follow Up Recommendations  CIR;Supervision/Assistance - 24 hour    Equipment Recommendations  Other (comment)(TBD)    Recommendations for Other Services Rehab consult     Precautions / Restrictions Precautions Precautions: Fall;Other (comment) Precaution Comments: wrist restraints and mitts Restrictions Weight Bearing Restrictions: No      Mobility Bed Mobility Overal bed mobility: Needs Assistance Bed Mobility: Supine to Sit;Sit to Supine;Rolling     Supine to sit: HOB elevated;Mod assist;+2 for physical assistance     General bed mobility comments: Mod assist  +2 to bring BLE toward EOB and to raise trunk from [facility]. Once seated at EOB able to sustain balance with min guard assist.   Transfers Overall transfer level: Needs assistance Equipment used: 2 person hand held assist Transfers: Sit to/from Stand Sit to Stand: Min assist;+2 safety/equipment         General transfer comment: Able to power up to standing and take steps from EOB toward recliner chair with min assist +2 for safety.     Balance Overall balance assessment: Needs assistance Sitting-balance support: Bilateral upper extremity supported;Feet unsupported Sitting balance-Leahy Scale: Fair Sitting balance - Comments: Able to sit at EOB    Standing balance support: No upper extremity supported;During functional activity Standing balance-Leahy Scale: Fair Standing balance comment: Able to statically stand without UE support but requires min assist to sustain balance while taking steps.                            ADL either performed or assessed with clinical judgement   ADL Overall ADL's : Needs assistance/impaired Eating/Feeding: NPO   Grooming: Set up;Bed level   Upper Body Bathing: Minimal assistance;Sitting   Lower Body Bathing: Minimal assistance;Sit to/from stand   Upper Body Dressing : Minimal assistance;Sitting   Lower Body Dressing: Minimal assistance;Sit to/from stand   Toilet Transfer: Minimal assistance;Regular Toilet;+2 for physical assistance(taking a few pivotal steps)   Toileting- Clothing Manipulation and Hygiene: Minimal assistance;Sit to/from stand       Functional mobility during ADLs: Minimal assistance;+2 for safety/equipment(taking a few steps only) General ADL Comments: Pt limited by decreased cognition and decreased activity tolerance for ADL. However, she was able to progress with ADL independence this session.  Vision   Additional Comments: Will continue to assess     Perception     Praxis      Pertinent Vitals/Pain  Pain Assessment: Faces Faces Pain Scale: Hurts a little bit Pain Location: Points to abdomen when asked if any pain Pain Descriptors / Indicators: Grimacing;Guarding Pain Intervention(s): Limited activity within patient's tolerance;Monitored during session;Repositioned     Hand Dominance     Extremity/Trunk Assessment Upper Extremity Assessment Upper Extremity Assessment: Generalized weakness   Lower Extremity Assessment Lower Extremity Assessment: Generalized weakness       Communication Communication Communication: Expressive difficulties   Cognition Arousal/Alertness: Awake/alert Behavior During Therapy: Flat affect Overall Cognitive Status: Difficult to assess Area of Impairment: Orientation;Attention;Following commands;Safety/judgement;Problem solving                 Orientation Level: Disoriented to;Situation Current Attention Level: Selective   Following Commands: Follows one step commands consistently;Follows multi-step commands with increased time Safety/Judgement: Decreased awareness of safety;Decreased awareness of deficits   Problem Solving: Slow processing;Decreased initiation;Difficulty sequencing;Requires verbal cues General Comments: Pt verbalizing quietly and able to whisper her name, DOB, that she is in the hospital and the date.    General Comments  VSS. RN present for beginning of session.     Exercises     Shoulder Instructions      Home Living Family/patient expects to be discharged to:: Private residence Living Arrangements: Parent;Children Available Help at Discharge: Family;Available 24 hours/day Type of Home: House Home Access: Stairs to enter [employer] of Steps: 2 Entrance Stairs-Rails: None Home Layout: One level     Bathroom Shower/Tub: Tub/shower unit         Home Equipment: None   Additional Comments: Mother supportive and in good health; available 24/7. Currently taking care of pt's son      Prior  Functioning/Environment Level of Independence: Independent        Comments: Drives, does not work, has 17 y.o. son        OT Problem List: Decreased strength;Decreased range of motion;Decreased activity tolerance;Impaired balance (sitting and/or standing);Decreased safety awareness;Decreased knowledge of use of DME or AE;Decreased knowledge of precautions;Pain      OT Treatment/Interventions: Self-care/ADL training;Therapeutic exercise;Energy conservation;DME and/or AE instruction;Therapeutic activities;Patient/family education;Balance training;Cognitive remediation/compensation;Visual/perceptual remediation/compensation    OT Goals(Current goals can be found in the care plan section) Acute Rehab OT Goals Patient Stated Goal: not stated OT Goal Formulation: With patient Time For Goal Achievement: 12/31/17 Potential to Achieve Goals: Good ADL Goals Pt Will Perform Grooming: with modified independence;sitting Pt Will Perform Lower Body Dressing: with modified independence;sit to/from stand Pt Will Transfer to Toilet: with modified independence;ambulating;regular height toilet Pt Will Perform Toileting - Clothing Manipulation and hygiene: with modified independence;sit to/from stand Pt/caregiver will Perform Home Exercise Program: Increased strength;Both right and left upper extremity;With written HEP provided;With Supervision Additional ADL Goal #1: Pt will demonstrate improved problem solving skills to complete novel ADL task with no more than 1 VC.  OT Frequency: Min 2X/week   Barriers to D/C:            Co-evaluation              AM-PAC PT "6 Clicks" Daily Activity     Outcome Measure Help from another person eating meals?: Total Help from another person taking care of personal grooming?: A Little Help from another person toileting, which includes using toliet, bedpan, or urinal?: A Lot Help from another person bathing (including washing, rinsing, drying)?: A Little Help  from another person to put on and taking off regular upper body clothing?: A Little Help from another person to put on and taking off regular lower body clothing?: A Little 6 Click Score: 15   End of Session Equipment Utilized During Treatment: Gait belt;Rolling walker Nurse Communication: Mobility status;Other (comment)(pt up in chair)  Activity Tolerance: Patient tolerated treatment well Patient left: in chair;with call bell/phone within reach;with chair alarm set  OT Visit Diagnosis: Other abnormalities of gait and mobility (R26.89);Other symptoms and signs involving cognitive function                Time: 9528-4132 OT Time Calculation (min): 22 min Charges:  OT General Charges $OT Visit: 1 Visit OT Evaluation $OT Eval Moderate Complexity: 1 Mod G-Codes:     Norman Herrlich, MS OTR/L  Pager: [place] A Dionic[place] 12/17/2017, 2:50 PM

## 2017-12-17 NOTE — Significant Event (Signed)
Rapid Response Event Note  Called at 625 about patient having low oxygen saturations and patient being agitated. When I arrived, patient was in respiratory distress, she was hypoxic and very agitated. It took multiple staff to help keep the patient while trying to give her 100% oxygen via 15L NRB. TRH MD on call came to the bedside and ordered CXR and ABG. Despite getting the oxygen, patient was still hypoxic and her agitated state persisted. While trying to obtain a chest xray, patient did become briefly unresponsive, a Code Blue was called for emergent airway.  Code Team arrived and PCCM did as well, patient underwent RSI and was transferred to 2M02  End Time 0735  - Late Entry -

## 2017-12-18 ENCOUNTER — Inpatient Hospital Stay (HOSPITAL_COMMUNITY): Payer: Medicaid Other

## 2017-12-18 DIAGNOSIS — D473 Essential (hemorrhagic) thrombocythemia: Secondary | ICD-10-CM

## 2017-12-18 DIAGNOSIS — R000 Tachycardia, unspecified: Secondary | ICD-10-CM

## 2017-12-18 DIAGNOSIS — F1910 Other psychoactive substance abuse, uncomplicated: Secondary | ICD-10-CM

## 2017-12-18 DIAGNOSIS — F1010 Alcohol abuse, uncomplicated: Secondary | ICD-10-CM

## 2017-12-18 DIAGNOSIS — D62 Acute posthemorrhagic anemia: Secondary | ICD-10-CM

## 2017-12-18 DIAGNOSIS — Z720 Tobacco use: Secondary | ICD-10-CM

## 2017-12-18 DIAGNOSIS — D72829 Elevated white blood cell count, unspecified: Secondary | ICD-10-CM

## 2017-12-18 DIAGNOSIS — I10 Essential (primary) hypertension: Secondary | ICD-10-CM

## 2017-12-18 DIAGNOSIS — E538 Deficiency of other specified B group vitamins: Secondary | ICD-10-CM

## 2017-12-18 DIAGNOSIS — R0682 Tachypnea, not elsewhere classified: Secondary | ICD-10-CM

## 2017-12-18 DIAGNOSIS — G6281 Critical illness polyneuropathy: Secondary | ICD-10-CM

## 2017-12-18 DIAGNOSIS — D75839 Thrombocytosis, unspecified: Secondary | ICD-10-CM

## 2017-12-18 DIAGNOSIS — J80 Acute respiratory distress syndrome: Secondary | ICD-10-CM

## 2017-12-18 DIAGNOSIS — J45909 Unspecified asthma, uncomplicated: Secondary | ICD-10-CM

## 2017-12-18 DIAGNOSIS — K50919 Crohn's disease, unspecified, with unspecified complications: Secondary | ICD-10-CM

## 2017-12-18 DIAGNOSIS — F909 Attention-deficit hyperactivity disorder, unspecified type: Secondary | ICD-10-CM

## 2017-12-18 DIAGNOSIS — M797 Fibromyalgia: Secondary | ICD-10-CM

## 2017-12-18 LAB — CBC
HCT: 31.3 % — ABNORMAL LOW (ref 36.0–46.0)
Hemoglobin: 8.9 g/dL — ABNORMAL LOW (ref 12.0–15.0)
MCH: 24.2 pg — ABNORMAL LOW (ref 26.0–34.0)
MCHC: 28.4 g/dL — ABNORMAL LOW (ref 30.0–36.0)
MCV: 85.1 fL (ref 78.0–100.0)
Platelets: 1028 10*3/uL (ref 150–400)
RBC: 3.68 MIL/uL — ABNORMAL LOW (ref 3.87–5.11)
RDW: 19.8 % — ABNORMAL HIGH (ref 11.5–15.5)
WBC: 21.6 10*3/uL — ABNORMAL HIGH (ref 4.0–10.5)

## 2017-12-18 LAB — IRON AND TIBC
Iron: 21 ug/dL — ABNORMAL LOW (ref 28–170)
Saturation Ratios: 6 % — ABNORMAL LOW (ref 10.4–31.8)
TIBC: 347 ug/dL (ref 250–450)
UIBC: 326 ug/dL

## 2017-12-18 LAB — COMPREHENSIVE METABOLIC PANEL
ALT: 32 U/L (ref 14–54)
AST: 17 U/L (ref 15–41)
Albumin: 2.9 g/dL — ABNORMAL LOW (ref 3.5–5.0)
Alkaline Phosphatase: 128 U/L — ABNORMAL HIGH (ref 38–126)
Anion gap: 17 — ABNORMAL HIGH (ref 5–15)
BUN: 10 mg/dL (ref 6–20)
CO2: 28 mmol/L (ref 22–32)
Calcium: 8.9 mg/dL (ref 8.9–10.3)
Chloride: 99 mmol/L — ABNORMAL LOW (ref 101–111)
Creatinine, Ser: 1.13 mg/dL — ABNORMAL HIGH (ref 0.44–1.00)
GFR calc Af Amer: >60 mL/min (ref >60)
GFR calc non Af Amer: >60 mL/min (ref >60)
Glucose, Bld: 130 mg/dL — ABNORMAL HIGH (ref 65–99)
Potassium: 2.3 mmol/L — CL (ref 3.5–5.1)
Sodium: 144 mmol/L (ref 135–145)
Total Bilirubin: 0.4 mg/dL (ref 0.3–1.2)
Total Protein: 6.4 g/dL — ABNORMAL LOW (ref 6.5–8.1)

## 2017-12-18 LAB — PATHOLOGIST SMEAR REVIEW

## 2017-12-18 LAB — VITAMIN B12: Vitamin B-12: 1081 pg/mL — ABNORMAL HIGH (ref 180–914)

## 2017-12-18 LAB — RETICULOCYTES
RBC.: 3.68 MIL/uL — ABNORMAL LOW (ref 3.87–5.11)
Retic Count, Absolute: 136.2 10*3/uL (ref 19.0–186.0)
Retic Ct Pct: 3.7 % — ABNORMAL HIGH (ref 0.4–3.1)

## 2017-12-18 LAB — FOLATE: Folate: 21.1 ng/mL (ref >5.9)

## 2017-12-18 LAB — FERRITIN: Ferritin: 29 ng/mL (ref 11–307)

## 2017-12-18 LAB — RPR: RPR Ser Ql: NONREACTIVE

## 2017-12-18 LAB — TSH: TSH: 7.189 u[IU]/mL — ABNORMAL HIGH (ref 0.350–4.500)

## 2017-12-18 LAB — AMMONIA: Ammonia: 20 umol/L (ref 9–35)

## 2017-12-18 IMAGING — DX DG ABD PORTABLE 1V
1 series · 1 of 1 positions shown · non-contrast
Comparison: [DATE]

CLINICAL DATA: Abdominal pain and ileus

EXAM:
PORTABLE ABDOMEN - 1 VIEW

[abdomen]
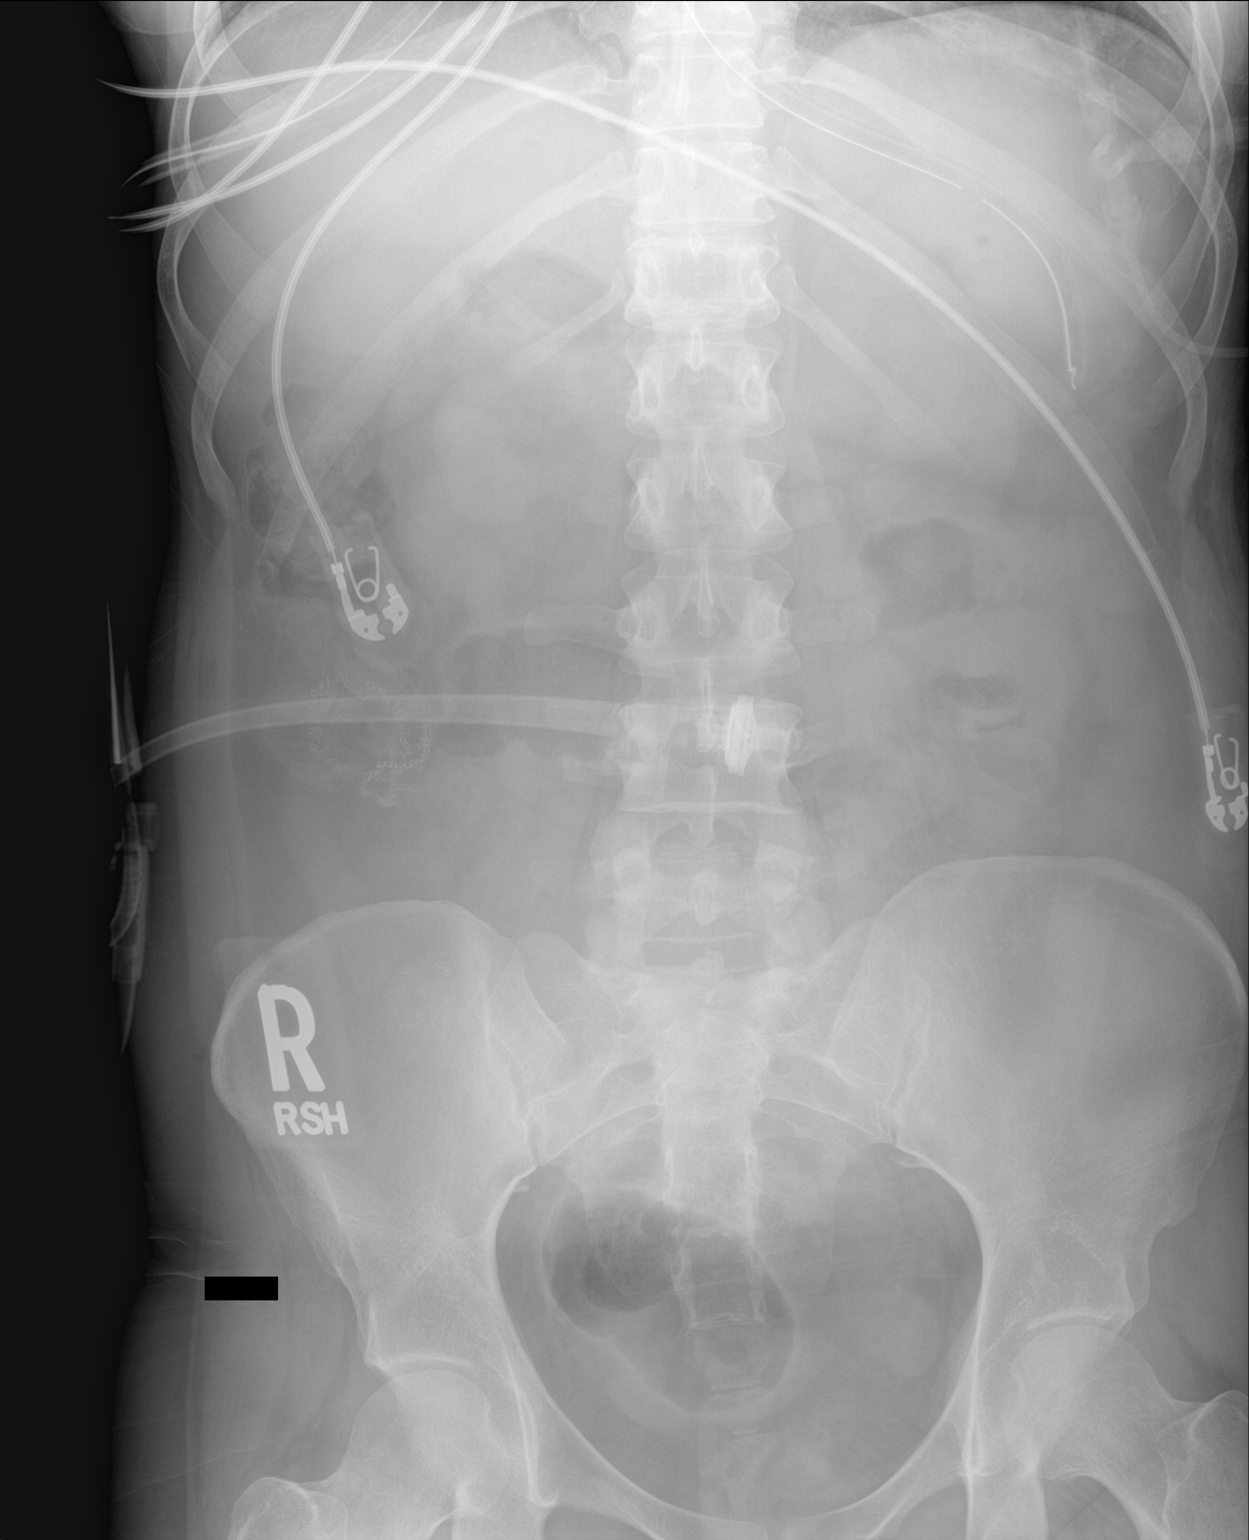

[1 of 1 positions shown; findings below may reference images not displayed]

FINDINGS: Scattered large and small bowel gas is noted. Nasogastric catheter
is noted within the stomach similar to that seen on the prior exam.
No bony abnormality is noted.
IMPRESSION: No acute abnormality seen.

## 2017-12-18 MED ORDER — HALOPERIDOL LACTATE 5 MG/ML IJ SOLN: 5.0000 mg | Freq: Once | INTRAMUSCULAR | Status: DC

## 2017-12-18 MED ORDER — POTASSIUM CHLORIDE 20 MEQ/15ML (10%) PO SOLN
40.0000 meq | Freq: Once | ORAL | Status: DC
  Filled 2017-12-18: qty 30

## 2017-12-18 MED ORDER — OXYCODONE-ACETAMINOPHEN 5-325 MG PO TABS
1.0000 | ORAL_TABLET | Freq: Four times a day (QID) | ORAL | Status: DC | PRN
  Administered 2017-12-18 – 2017-12-20 (×6): 1 via ORAL
  Filled 2017-12-18 (×6): qty 1

## 2017-12-18 MED ORDER — POTASSIUM CHLORIDE 10 MEQ/100ML IV SOLN
10.0000 meq | INTRAVENOUS | Status: AC
  Administered 2017-12-18 (×5): 10 meq via INTRAVENOUS
  Filled 2017-12-18 (×4): qty 100

## 2017-12-18 MED ORDER — POTASSIUM CHLORIDE 10 MEQ/100ML IV SOLN
10.0000 meq | INTRAVENOUS | Status: DC
  Administered 2017-12-18: 10 meq via INTRAVENOUS
  Filled 2017-12-18 (×2): qty 100

## 2017-12-18 MED ORDER — SODIUM CHLORIDE 0.9 % IV BOLUS
1000.0000 mL | Freq: Once | INTRAVENOUS | Status: AC
  Administered 2017-12-18: 1000 mL via INTRAVENOUS

## 2017-12-18 MED ORDER — KCL IN DEXTROSE-NACL 20-5-0.2 MEQ/L-%-% IV SOLN
INTRAVENOUS | Status: DC
  Administered 2017-12-18 – 2017-12-19 (×3): via INTRAVENOUS
  Filled 2017-12-18 (×4): qty 1000

## 2017-12-18 MED ORDER — METOPROLOL TARTRATE 5 MG/5ML IV SOLN
5.0000 mg | Freq: Four times a day (QID) | INTRAVENOUS | Status: DC
  Administered 2017-12-18 – 2017-12-19 (×3): 5 mg via INTRAVENOUS
  Filled 2017-12-18 (×5): qty 5

## 2017-12-18 MED ORDER — ALPRAZOLAM 0.5 MG PO TABS
1.0000 mg | ORAL_TABLET | Freq: Three times a day (TID) | ORAL | Status: DC | PRN
  Administered 2017-12-18: 1 mg via ORAL
  Filled 2017-12-18: qty 2

## 2017-12-18 MED ORDER — LORAZEPAM 2 MG/ML IJ SOLN: 0.5000 mg | Freq: Four times a day (QID) | INTRAMUSCULAR | Status: DC | PRN

## 2017-12-18 MED ORDER — ORAL CARE MOUTH RINSE
15.0000 mL | Freq: Two times a day (BID) | OROMUCOSAL | Status: DC
  Administered 2017-12-18 – 2017-12-19 (×2): 15 mL via OROMUCOSAL

## 2017-12-18 NOTE — Progress Notes (Signed)
[facility] TEAM 1 - Stepdown/ICU TEAM  [name]  YIF:[id number] DOB: 12/14/1980 DOA: 12/06/2017 PCP: Patient, [name]    Brief Narrative:  37 y/o female with Crohn's, EtOH abuse, and chronic narcotic use who was admitted with cough, chest pain, and dyspnea.  She developed ARDS and required intubation/ventilation.  Significant Events: 5/1 admit  5/3 moved to ICU - intubated, paralyzed, prone 5/9 extubated  5/10 NG > 4L bloody drainage  Subjective: The pt is much more calm and interactive this morning, though she remains somewhat confused.  Her NG produced ~3L of bile stained fluid over the last 24hrs.  She reports vague crampy abdom pain, but denies severe nausea.  She is begging to be allowed to eat.  She denies sob or chest pain.    Assessment & Plan:  ARDS - Severe Rhinovirus Pneumonitis c/b aspiration  Extubated 5/9 - cont rocephin through 5/14 per ID suggestion - now on RA   Acute Metabolic Encephalopathy  Likely multifactorial (substance withdrawal, ICU psychosis, etc) - B12 & folate normal, ammonia normal, RPR negative - seroquel QHS for sleep but otherwise attempt to avoid sedatives   Voluminous NG output GI has seen and does not feel she has a signif GIB - Hgb is indeed stable/climbing - she continues to put out a high volume of bile stained fluid in her NG - check KUB to r/o ileus - cont reglan to stimulate downward mobility - maximize electrolytes   Normocytic anemia - Fe deficiency Hgb holding steady - monitor - anemia panel suggests Fe deficiency - load w/ Fe during this admit   Recent Labs  Lab 12/16/17 0224 12/16/17 0353 12/16/17 1450 12/16/17 2110 12/17/17 0537 12/18/17 0530  HGB 7.9* 7.8* 7.5* 7.4* 7.6* 8.9*    AKI resolved w/ volume resuscitation   Severe Hypokalemia  Likely due to GI losses - Mg is normal   Hypernatremia  Resolved w/ increased free water   Crohns - Chronic diarrhea Stop reglan asap  Acute pansinusitis and  otomastoiditis ENT has seen - cont rocephin - no further intervention indicated   Opioid dependence Reportedly does not use IV drugs  EtOH use  Elevated TSH  Check FT4 and FT3  DVT prophylaxis: SQ heparin  Code Status: FULL CODE Family Communication: no family present at time of exam    Disposition Plan: SDU  Consultants:  PCCM ENT GI ID  Antimicrobials:  5/1 ceftriaxone > 5/2 5/2 azithro > 5/2 5/3 vanc > 5/4>>> 5/7>>>5/9 5/3 Mero > 5/4 >>> 5/7>>>5/9 5/4 ceftriaxone > 5/7>>>5/9>>>  Objective: Blood pressure (!) 122/94, pulse (!) 114, temperature 98.1 F (36.7 C), temperature source Oral, resp. rate (!) 30, height 5' 2" (1.575 m), weight 57.8 kg (127 lb 6.8 oz), last menstrual period 12/03/2017, SpO2 91 %.  Intake/Output Summary (Last 24 hours) at 12/18/2017 0833 Last data filed at 12/18/2017 0800 Gross per 24 hour  Intake 2012.91 ml  Output 3300 ml  Net -1287.09 ml   Filed Weights   12/16/17 1111 12/17/17 0500 12/18/17 0139  Weight: 60.5 kg (133 lb 6.1 oz) 58.4 kg (128 lb 12 oz) 57.8 kg (127 lb 6.8 oz)    Examination: General: No acute respiratory distress - alert and interactive  Lungs: Clear to auscultation bilaterally - no wheezing  Cardiovascular: tachycardic but regular - no M or rub  Abdomen: NT/ND, soft, bs+, no mass, no rebound - NG in place  Extremities: No C/C/E B LE   CBC: Recent Labs  Lab 12/16/17  1450 12/16/17 2110 12/17/17 0537 12/18/17 0530  WBC 29.8* 25.3* 23.0* 21.6*  NEUTROABS 23.2* 19.1* 17.3*  --   HGB 7.5* 7.4* 7.6* 8.9*  HCT 27.3* 26.9* 27.4* 31.3*  MCV 86.4 87.1 87.0 85.1  PLT 716* 760* 789* 7,711*   Basic Metabolic Panel: Recent Labs  Lab 12/14/17 0335  12/16/17 0353 12/16/17 0605 12/17/17 0537 12/18/17 0530  NA 143   < > 151* 149*  --  144  K 4.4   < > 3.6 3.6  --  2.3*  CL 110   < > 107 105  --  99*  CO2 26   < > 36* 34*  --  28  GLUCOSE 143*   < > 125* 123*  --  130*  BUN 20   < > 20 19  --  10  CREATININE 0.72    < > 1.05* 1.04*  --  1.13*  CALCIUM 8.6*   < > 8.2* 8.2*  --  8.9  MG 2.3  --  2.5*  --  2.2  --   PHOS 4.1  --  2.7  --  3.3  --    < > = values in this interval not displayed.   GFR: Estimated Creatinine Clearance: 54.4 mL/min (A) (by C-G formula based on SCr of 1.13 mg/dL (H)).  Liver Function Tests: Recent Labs  Lab 12/16/17 0353 12/18/17 0530  AST 20 17  ALT 46 32  ALKPHOS 146* 128*  BILITOT 0.4 0.4  PROT 5.5* 6.4*  ALBUMIN 2.1* 2.9*   HbA1C: Hgb A1c MFr Bld  Date/Time Value Ref Range Status  10/10/2013 05:00 AM 5.4 <5.7 % Final    Comment:    (NOTE)                                                                       According to the ADA Clinical Practice Recommendations for 2011, when HbA1c is used as a screening test:  >=6.5%   Diagnostic of Diabetes Mellitus           (if abnormal result is confirmed) 5.7-6.4%   Increased risk of developing Diabetes Mellitus References:Diagnosis and Classification of Diabetes Mellitus,Diabetes Care,2011,34(Suppl 1):S62-S69 and Standards of Medical Care in         Diabetes - 2011,Diabetes AFBX,0383,33 (Suppl 1):S11-S61.  10/25/2010 04:59 AM (H) <5.7 % Final   5.7 (NOTE)                                                                       According to the ADA Clinical Practice Recommendations for 2011, when HbA1c is used as a screening test:   >=6.5%   Diagnostic of Diabetes Mellitus           (if abnormal result  is confirmed)  5.7-6.4%   Increased risk of developing Diabetes Mellitus  References:Diagnosis and Classification of Diabetes Mellitus,Diabetes OVAN,1916,60(AYOKH 1):S62-S69 and Standards of Medical Care in         Diabetes - 2011,Diabetes  EHOZ,2248,25  (Suppl 1):S11-S61.    CBG: Recent Labs  Lab 12/14/17 0723 12/14/17 1233 12/14/17 1929 12/15/17 1513 12/16/17 2343  GLUCAP 135* 103* 108* 122* 94    Recent Results (from the past 240 hour(s))  Culture, expectorated sputum-assessment     Status: None    Collection Time: 12/12/17  8:38 AM  Result Value Ref Range Status   Specimen Description SPUTUM  Final   Special Requests NONE  Final   Sputum evaluation   Final    THIS SPECIMEN IS ACCEPTABLE FOR SPUTUM CULTURE Performed at [facility], [place] [address], [place], [place] [address]    Report Status 12/12/2017 FINAL  Final  Urine Culture     Status: None   Collection Time: 12/12/17  8:38 AM  Result Value Ref Range Status   Specimen Description URINE, CATHETERIZED  Final   Special Requests NONE  Final   Culture   Final    NO GROWTH Performed at [facility], [place] [address] [place], [place] [address]    Report Status 12/13/2017 FINAL  Final  Culture, respiratory (NON-Expectorated)     Status: None   Collection Time: 12/12/17  8:38 AM  Result Value Ref Range Status   Specimen Description SPUTUM  Final   Special Requests NONE Reflexed from [id number]  Final   Gram Stain   Final    MODERATE WBC PRESENT, PREDOMINANTLY PMN NO ORGANISMS SEEN    Culture   Final    NO GROWTH 2 DAYS Performed at [facility], [place] [address], [place], [place] [address]    Report Status 12/14/2017 FINAL  Final  Culture, blood (Routine X 2) w Reflex to ID Panel     Status: None   Collection Time: 12/12/17  9:09 AM  Result Value Ref Range Status   Specimen Description BLOOD LEFT ANTECUBITAL  Final   Special Requests   Final    BOTTLES DRAWN AEROBIC ONLY Blood Culture adequate volume   Culture   Final    NO GROWTH 5 DAYS Performed at [facility], [place] [address], [place], [place] [address]    Report Status 12/17/2017 FINAL  Final  Culture, blood (Routine X 2) w Reflex to ID Panel     Status: None   Collection Time: 12/12/17  9:10 AM  Result Value Ref Range Status   Specimen Description BLOOD BLOOD RIGHT FOREARM  Final   Special Requests   Final    BOTTLES DRAWN AEROBIC ONLY Blood Culture adequate volume   Culture   Final    NO GROWTH 5 DAYS Performed at [facility],  [place] [address], [place], [place] [address]    Report Status 12/17/2017 FINAL  Final     Scheduled Meds: . budesonide (PULMICORT) nebulizer solution  0.5 mg Nebulization BID  . chlorhexidine gluconate (MEDLINE KIT)  15 mL Mouth Rinse BID  . Chlorhexidine Gluconate Cloth  6 each Topical Daily  . folic acid  1 mg Intravenous Daily  . haloperidol lactate  5 mg Intravenous Once  . heparin  5,000 Units Subcutaneous Q8H  . mouth rinse  15 mL Mouth Rinse QID  . QUEtiapine  50 mg Oral QHS  . sodium chloride flush  10-40 mL Intracatheter Q12H  . thiamine injection  100 mg Intravenous Daily     LOS: 11 days   Cherene Altes, MD [facility] Office  [phone] Pager - Text Page per Shea Evans as per below:  On-Call/Text Page:      [email or web address]      password [email or web address]  If 7PM-7AM, please contact night-coverage [email or web address] Password [email or web address] 12/18/2017, 8:33 AM

## 2017-12-18 NOTE — Progress Notes (Signed)
CRITICAL VALUE ALERT  Critical Value: K 2.3   Date & Time Notied:  5/13 08:00  Provider Notified: Paged Mcclung, J.   Orders Received/Actions taken: Awaiting orders, will continue to monitor

## 2017-12-18 NOTE — Clinical Social Work Note (Signed)
Clinical Social Work Assessment  Patient Details  Name: [name] MRN: [id number] Date of Birth: Oct 10, 1980  Date of referral:  12/18/17               Reason for consult:  Facility Placement                Permission sought to share information with:  Other(none given ) Permission granted to share information::  No  Name::     none given   Agency::  none given   Relationship::  none given  Contact Information:  none given   Housing/Transportation Living arrangements for the past 2 months:  Single Family Home(with mother and son.) Source of Information:  Patient Patient Interpreter Needed:  None Criminal Activity/Legal Involvement Pertinent to Current Situation/Hospitalization:  No - Comment as needed Significant Relationships:  Dependent Children, Parents Lives with:  Parents Do you feel safe going back to the place where you live?  Yes Need for family participation in patient care:  Yes (Comment)  Care giving concerns:  CSW spoke with pt at bedside. At this time pt denies having any concerns regarding care. Pt was very hard to hear as pt spoke in a lower tone.    Social Worker assessment / plan:  CSW spoke with pt at bedside. During this time CSW was informed that pt is from home with mother and son. Pt expressed that mother and son are pt's primary  Supports and help pt if pt ever needs anything. CSW spoke with pt about rehab options and at this time pt is wanting to go [facility] with [facility]. Pt possible being looked at by CIR however CSW did speak with pt about SNF placement if needed. Pt did not give a "ye or no" response" when asked about SNF placement. Pt appeared to be unsure and was able to mouth "we will see what PT says".   During this assessment pt was in bed watching tv. Pt looked a little uncomfortable but denied any pain when RN asked about discomfort.   Employment status:  Other (Comment)(unknown at this time. ) Insurance information:  Medicaid In [place] PT  Recommendations:  [place] / Referral to community resources:  Skilled Nursing Facility(spoke with pt about SNF placement and pt wants to go home. )  Patient/Family's Response to care:  Pt appeared to be understanding and agreeable to recommendations from PT on needs once discharged from the hospital.  Patient/Family's Understanding of and Emotional Response to Diagnosis, Current Treatment, and Prognosis:  No further questions or concerns have been presented to CSW at this time. Pt's emotional response to care was appropriate for diagnosis given.   Emotional Assessment Appearance:  Appears stated age Attitude/Demeanor/Rapport:  Engaged Affect (typically observed):  Appropriate Orientation:  Oriented to Self, Oriented to Place, Oriented to  Time, Oriented to Situation Alcohol / Substance use:  Not Applicable Psych involvement (Current and /or in the community):  No (Comment)(not at this time. )  Discharge Needs  Concerns to be addressed:  Denies Needs/Concerns at this time Readmission within the last 30 days:  No Current discharge risk:  Dependent with Mobility Barriers to Discharge:  Continued Medical Work up   [employer], [place] 12/18/2017, 8:31 AM

## 2017-12-18 NOTE — Consult Note (Addendum)
Physical Medicine and Rehabilitation Consult   Reason for Consult:  Debility Referring Physician:  Dr. Thereasa Solo   HPI: [name] is a 37 y.o. female with history of ADHD, fibromyalgia, B12 deficiency, Crohn's disease, asthma, question of IV drug use, ETOH abuse  was admitted on 12/06/2017 with cough, chest pain, N/V/D and abdominal pain with respiratory distress. History taken from chart review. She was started on broad spectrum antibiotics due to sepsis. On 5/3, she required intubation due to ARDS due to aspiration or severe rhinovirus pneumonitis. ID consulted for input and recommended narrowing antibiotics as well as CT sinus for work up due to persistent fevers. She has had issues with agitation question due to withdrawal from opiates and  ETOH. She tolerated extubation on 5/9 and was found to have coffee ground liquids via NGT. Dr. Alessandra Bevels recommended conservative management with serial H/H and IV Protonix. EGD if H/H declines.   CT sinus showed acute sinusitis and otomastoiditis and Dr. Redmond Baseman consulted for input and did not feel that sepsis was solely due to sinus/ear findings. He recommended completing current antibiotic regimen and no surgical intervention needed. AKI improving and reactive leucocytosis resolving. She continues to have issues with delirium, dysphonia, cognitive deficits and debility. CIR recommended by rehab team.   Review of Systems  Constitutional: Positive for malaise/fatigue.  HENT: Negative for hearing loss and tinnitus.   Eyes: Negative for blurred vision.  Respiratory: Positive for cough, shortness of breath and wheezing.   Cardiovascular: Positive for chest pain. Negative for leg swelling.  Gastrointestinal: Positive for abdominal pain. Negative for nausea and vomiting.  Genitourinary: Negative for dysuria.  Musculoskeletal: Positive for back pain (was getting worse PTA) and myalgias.  Neurological: Positive for speech change. Negative for  dizziness and headaches.  All other systems reviewed and are negative.     Past Medical History:  Diagnosis Date  . Abdominal pain, unspecified site 03/24/2009  . 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 ADHD 12/02/2009  . Allergic rhinitis, cause unspecified 01/21/2011  . ANXIETY 03/24/2009  . B12 DEFICIENCY 04/28/2009  . Bronchitis 12/2017  . BURSITIS, RIGHT KNEE 02/05/2010  . Cellulitis and abscess of leg, except foot 02/05/2010  . Cervicalgia 12/02/2009  . COMMON MIGRAINE 02/05/2010  . CROHN'S [facility] INTESTINE 05/19/2009  . ECZEMA 05/20/2010  . Endometriosis 08/04/2011  . Fibromyalgia   . GERD 03/24/2009  . HEADACHE, CHRONIC 03/24/2009  . HYPERTENSION 03/24/2009  . Osteoarthritis   . OTITIS MEDIA, LEFT 08/12/2010  . SMOKER 12/02/2009  . Spine pain 01/21/2011   neck and thoracic spine  . VITAMIN B1 DEFICIENCY 09/21/2009  . Wheezing 08/12/2010    Past Surgical History:  Procedure Laterality Date  . BREAST ENHANCEMENT SURGERY  2004  . ENDOMETRIAL ABLATION  01/2009   Thinks laproscopic with possible transvaginal  . RECTAL PROLAPSE REPAIR      Family History  Problem Relation Age of Onset  . Cancer Mother        breast  . Clotting disorder Mother   . Cancer Maternal Grandmother        Stomach Cancer  . Cancer Maternal Grandfather        Esophageal Cancer    Social History:  Lives with son and mother. Mother works days and son in school at this time. She is disabled but independent? She reports that she has been smoking cigarettes--less than 1 PPD.  She has a 7.00 pack-year smoking history. She has never used smokeless tobacco. She reports that  she drinks alcohol--"not often but last in April".  Per reports that she has current or past drug history. Drug: Other-see comments.    Allergies  Allergen Reactions  . Penicillins Hives    Has patient had a PCN reaction causing immediate rash, facial/tongue/throat swelling, SOB or lightheadedness with hypotension: Yes Has patient had a PCN reaction  causing severe rash involving mucus membranes or skin necrosis: Unk Has patient had a PCN reaction that required hospitalization: Yes Has patient had a PCN reaction occurring within the last 10 years: No If all of the above answers are "NO", then may proceed with Cephalosporin use.   Marland Kitchen Doxycycline Other (See Comments)    Patient cannot recall the reaction    Medications Prior to Admission  Medication Sig Dispense Refill  . esomeprazole (NEXIUM) 40 MG capsule Take 40 mg by mouth 2 (two) times daily before a meal.    . naproxen sodium (ALEVE) 220 MG tablet Take 220-440 mg by mouth 2 (two) times daily as needed (for pain or discomfort).    . ALPRAZolam (XANAX) 1 MG tablet Take 1 tablet (1 mg total) by mouth 3 (three) times daily as needed for anxiety. (Patient not taking: Reported on 12/06/2017) 90 tablet 0  . budesonide (ENTOCORT EC) 3 MG 24 hr capsule Take 3 capsules (9 mg total) by mouth daily. (Patient not taking: Reported on 12/06/2017) 30 capsule 0  . ciprofloxacin (CIPRO) 500 MG tablet Take 1 tablet (500 mg total) by mouth every 12 (twelve) hours. (Patient not taking: Reported on 12/06/2017) 14 tablet 0  . clindamycin (CLEOCIN) 300 MG capsule Take 1 capsule (300 mg total) by mouth every 6 (six) hours. (Patient not taking: Reported on 12/06/2017) 28 capsule 0  . fexofenadine (ALLEGRA) 180 MG tablet Take 1 tablet (180 mg total) by mouth daily. (Patient not taking: Reported on 12/06/2017) 30 tablet 11  . magnesium oxide (MAG-OX) 400 MG tablet Take 1 tablet (400 mg total) by mouth 2 (two) times daily. (Patient not taking: Reported on 12/06/2017) 60 tablet 3  . metoprolol succinate (TOPROL-XL) 100 MG 24 hr tablet Take 1 tablet (100 mg total) by mouth daily. Take with or immediately following a meal. (Patient not taking: Reported on 12/06/2017) 90 tablet 3  . ondansetron (ZOFRAN ODT) 4 MG disintegrating tablet Take 1 tablet (4 mg total) by mouth every 8 (eight) hours as needed for nausea or vomiting. (Patient  not taking: Reported on 12/06/2017) 20 tablet 0  . oxyCODONE (ROXICODONE) 5 MG immediate release tablet Take 1 tablet (5 mg total) by mouth every 4 (four) hours as needed for severe pain. (Patient not taking: Reported on 12/06/2017) 10 tablet 0  . oxyCODONE-acetaminophen (PERCOCET/ROXICET) 5-325 MG tablet Take 1 tablet by mouth every 6 (six) hours as needed for severe pain. (Patient not taking: Reported on 12/06/2017) 8 tablet 0  . pantoprazole (PROTONIX) 40 MG tablet Take 1 tablet (40 mg total) by mouth 2 (two) times daily. (Patient not taking: Reported on 12/06/2017) 60 tablet 5  . polyethylene glycol (MIRALAX / GLYCOLAX) packet Take 34 g by mouth daily. (Patient not taking: Reported on 12/06/2017) 14 each 0    Home: Home Living Family/patient expects to be discharged to:: Private residence Living Arrangements: Parent, Children Available Help at Discharge: Family, Available 24 hours/day Type of Home: House Home Access: Stairs to enter [employer] of Steps: 2 Entrance Stairs-Rails: None Home Layout: One level Bathroom Shower/Tub: Tub/shower unit Home Equipment: None Additional Comments: Mother supportive and in good health;  available 24/7. Currently taking care of pt's son  Functional History: Prior Function Level of Independence: Independent Comments: Drives, does not work, has 11 y.o. son Functional Status:  Mobility: Bed Mobility Overal bed mobility: Needs Assistance Bed Mobility: Supine to Sit, Sit to Supine, Rolling Rolling: Min guard Supine to sit: HOB elevated, Mod assist, +2 for physical assistance Sit to supine: Min assist, HOB elevated General bed mobility comments: Mod assist +2 to bring BLE toward EOB and to raise trunk from [facility]. Once seated at EOB able to sustain balance with min guard assist.  Transfers Overall transfer level: Needs assistance Equipment used: 2 person hand held assist Transfers: Sit to/from Stand Sit to Stand: Min assist, +2  safety/equipment General transfer comment: Able to power up to standing and take steps from EOB toward recliner chair with min assist +2 for safety.  Ambulation/Gait Ambulation/Gait assistance: Mod assist Ambulation Distance (Feet): 3 Feet Assistive device: 1 person hand held assist Gait Pattern/deviations: Step-to pattern, Staggering right, Staggering left, Leaning posteriorly General Gait Details: Took steps towards [facility] with HHA and modA to prevent posterior/anterior LOB; pt with increased sway in all directions Gait velocity: Decreased    ADL: ADL Overall ADL's : Needs assistance/impaired Eating/Feeding: NPO Grooming: Set up, Bed level Upper Body Bathing: Minimal assistance, Sitting Lower Body Bathing: Minimal assistance, Sit to/from stand Upper Body Dressing : Minimal assistance, Sitting Lower Body Dressing: Minimal assistance, Sit to/from stand Toilet Transfer: Minimal assistance, Regular Toilet, +2 for physical assistance(taking a few pivotal steps) Toileting- Clothing Manipulation and Hygiene: Minimal assistance, Sit to/from stand Functional mobility during ADLs: Minimal assistance, +2 for safety/equipment(taking a few steps only) General ADL Comments: Pt limited by decreased cognition and decreased activity tolerance for ADL. However, she was able to progress with ADL independence this session.   Cognition: Cognition Overall Cognitive Status: Difficult to assess Orientation Level: Oriented to person, Disoriented to situation, Disoriented to time, Disoriented to place Cognition Arousal/Alertness: Awake/alert Behavior During Therapy: Flat affect Overall Cognitive Status: Difficult to assess Area of Impairment: Orientation, Attention, Following commands, Safety/judgement, Problem solving Orientation Level: Disoriented to, Situation Current Attention Level: Selective Following Commands: Follows one step commands consistently, Follows multi-step commands with increased  time Safety/Judgement: Decreased awareness of safety, Decreased awareness of deficits Problem Solving: Slow processing, Decreased initiation, Difficulty sequencing, Requires verbal cues General Comments: Pt verbalizing quietly and able to whisper her name, DOB, that she is in the hospital and the date.    Blood pressure (!) 122/94, pulse (!) 114, temperature 98.1 F (36.7 C), temperature source Oral, resp. rate (!) 30, height 5' 2"  (1.575 m), weight 57.8 kg (127 lb 6.8 oz), last menstrual period 12/03/2017, SpO2 91 %. Physical Exam  Nursing note and vitals reviewed. Constitutional: She appears well-developed and well-nourished.  NGT in place.   HENT:  Head: Normocephalic and atraumatic.  Eyes: EOM are normal. Right eye exhibits no discharge. Left eye exhibits no discharge.  Neck: Normal range of motion. Neck supple.  Cardiovascular: Regular rhythm.  + tachycardia  Respiratory: Effort normal and breath sounds normal.  Intermittent cough with gurgling sounds (posterior pharynx)   GI: Soft.  NGT in place with constant flow of brown liquids mixed with small particles. Abdomen with non-specific tenderness.   Musculoskeletal:  No edema or tenderness in extremities  Neurological: She is alert.  Dysphonia with wet, whispery voice.  Motor: 4 -/5 grossly throughout Sensation diminished to light touch in bilateral feet and fingers  Skin: Skin is warm and dry.  Psychiatric:  Her affect is blunt. Her speech is slurred. She is hyperactive.    Results for orders placed or performed during the hospital encounter of 12/06/17 (from the past 24 hour(s))  RPR     Status: None   Collection Time: 12/17/17 11:44 AM  Result Value Ref Range   RPR Ser Ql Non Reactive Non Reactive  CBC     Status: Abnormal   Collection Time: 12/18/17  5:30 AM  Result Value Ref Range   WBC 21.6 (H) 4.0 - 10.5 K/uL   RBC 3.68 (L) 3.87 - 5.11 MIL/uL   Hemoglobin 8.9 (L) 12.0 - 15.0 g/dL   HCT 31.3 (L) 36.0 - 46.0 %    MCV 85.1 78.0 - 100.0 fL   MCH 24.2 (L) 26.0 - 34.0 pg   MCHC 28.4 (L) 30.0 - 36.0 g/dL   RDW 19.8 (H) 11.5 - 15.5 %   Platelets 1,028 (HH) 150 - 400 K/uL  Comprehensive metabolic panel     Status: Abnormal   Collection Time: 12/18/17  5:30 AM  Result Value Ref Range   Sodium 144 135 - 145 mmol/L   Potassium 2.3 (LL) 3.5 - 5.1 mmol/L   Chloride 99 (L) 101 - 111 mmol/L   CO2 28 22 - 32 mmol/L   Glucose, Bld 130 (H) 65 - 99 mg/dL   BUN 10 6 - 20 mg/dL   Creatinine, Ser 1.13 (H) 0.44 - 1.00 mg/dL   Calcium 8.9 8.9 - 10.3 mg/dL   Total Protein 6.4 (L) 6.5 - 8.1 g/dL   Albumin 2.9 (L) 3.5 - 5.0 g/dL   AST 17 15 - 41 U/L   ALT 32 14 - 54 U/L   Alkaline Phosphatase 128 (H) 38 - 126 U/L   Total Bilirubin 0.4 0.3 - 1.2 mg/dL   GFR calc non Af Amer >60 >60 mL/min   GFR calc Af Amer >60 >60 mL/min   Anion gap 17 (H) 5 - 15  Vitamin B12     Status: Abnormal   Collection Time: 12/18/17  5:30 AM  Result Value Ref Range   Vitamin B-12 1,081 (H) 180 - 914 pg/mL  Folate     Status: None   Collection Time: 12/18/17  5:30 AM  Result Value Ref Range   Folate 21.1 >5.9 ng/mL  Iron and TIBC     Status: Abnormal   Collection Time: 12/18/17  5:30 AM  Result Value Ref Range   Iron 21 (L) 28 - 170 ug/dL   TIBC 347 250 - 450 ug/dL   Saturation Ratios 6 (L) 10.4 - 31.8 %   UIBC 326 ug/dL  Ferritin     Status: None   Collection Time: 12/18/17  5:30 AM  Result Value Ref Range   Ferritin 29 11 - 307 ng/mL  Reticulocytes     Status: Abnormal   Collection Time: 12/18/17  5:30 AM  Result Value Ref Range   Retic Ct Pct 3.7 (H) 0.4 - 3.1 %   RBC. 3.68 (L) 3.87 - 5.11 MIL/uL   Retic Count, Absolute 136.2 19.0 - 186.0 K/uL  Ammonia     Status: None   Collection Time: 12/18/17  5:30 AM  Result Value Ref Range   Ammonia 20 9 - 35 umol/L  TSH     Status: Abnormal   Collection Time: 12/18/17  5:30 AM  Result Value Ref Range   TSH 7.189 (H) 0.350 - 4.500 uIU/mL   No results  found.  Assessment/Plan: Diagnosis: CIP  Labs and images independently reviewed.  Records reviewed and summated above.  1. Does the need for close, 24 hr/day medical supervision in concert with the patient's rehab needs make it unreasonable for this patient to be served in a less intensive setting? Potentially  2. Co-Morbidities requiring supervision/potential complications: tobacco, ETOH abuse (counsel), leukocytosis (cont to monitor for signs and symptoms of infection, further workup if indicated), AKI (avoid nephrotoxic meds), ADHD (medications if necessary), fibromyalgia (Biofeedback training with therapies to help reduce reliance on opiate pain medications, monitor pain control during therapies, and sedation at rest and titrate to maximum efficacy to ensure participation and gains in therapies), B12 deficiency (supplementation), Crohn's disease, asthma (monitor respiratory rate and O2 sats with increased exertion), question of IV drug use, Tachycardia (monitor in accordance with pain and increasing activity), tachypnea (monitor RR and O2 Sats with increased physical exertion), hypokalemia- critical value (continue to monitor and replete as necessary), ABLA (transfuse if necessary to ensure appropriate perfusion for increased activity tolerance), thrombocytosis (continue to monitor, treated necessary) 3. Due to safety, disease management, medication administration and patient education, does the patient require 24 hr/day rehab nursing? Yes 4. Does the patient require coordinated care of a physician, rehab nurse, PT (1-2 hrs/day, 5 days/week), OT (1-2 hrs/day, 5 days/week) and SLP (1-2 hrs/day, 5 days/week) to address physical and functional deficits in the context of the above medical diagnosis(es)? Yes Addressing deficits in the following areas: balance, endurance, locomotion, strength, transferring, bowel/bladder control, toileting, cognition, speech and psychosocial support 5. Can the patient  actively participate in an intensive therapy program of at least 3 hrs of therapy per day at least 5 days per week? Potentially 6. The potential for patient to make measurable gains while on inpatient rehab is excellent and good 7. Anticipated functional outcomes upon discharge from inpatient rehab are supervision  with PT, supervision with OT, modified independent and supervision with SLP. 8. Estimated rehab length of stay to reach the above functional goals is: 7-10 days. 9. Anticipated D/C setting: Home 10. Anticipated post D/C treatments: HH therapy and Home excercise program 11. Overall Rehab/Functional Prognosis: excellent  RECOMMENDATIONS: This patient's condition is appropriate for continued rehabilitative care in the following setting: Recommend reevaluation by therapies. If patient continues to have functional deficits after medically stable, would recommend CIR. Patient has agreed to participate in recommended program. Potentially Note that insurance prior authorization may be required for reimbursement for recommended care.  Comment: Rehab Admissions Coordinator to follow up.   I have personally performed a face to face diagnostic evaluation, including, but not limited to relevant history and physical exam findings, of this patient and developed relevant assessment and plan.  Additionally, I have reviewed and concur with the physician assistant's documentation above.   Delice Lesch, MD, ABPMR Bary Leriche, PA-C 12/18/2017

## 2017-12-18 NOTE — Progress Notes (Signed)
Pts BP dropped to 80/59 (65 MAP) when trying to get up to chair with PT, Repeat BP 82/72 (78 MAP) Pt reports feeling dizzy, Oxygen sats dropping to 88-89% on room air, pt placed on 2L  Excess dark brown output from NG tube (2300 cc total since 7am this morning)  Mcclung, MD paged, orders received 1L bolus NS currently in process Maintenance fluid rate changed from 75 to 167ml/hr  Will continue to monitor patient

## 2017-12-18 NOTE — Progress Notes (Signed)
Physical Therapy Treatment Patient Details Name: [name] MRN: [id number] DOB: June 22, 1981 Today's Date: 12/18/2017    History of Present Illness Pt is a 37 y.o. female admitted 12/06/17 with cough, chest pain, hearing loss, N/V, and face/hands tingling. Worked up for bronchitis vs. undiagnosed COPD and sepsis. Pt developed ARDS either from aspiration pneumonitis or rhinovirus; intubated 5/3 and self-extubated 5/8. Potential upper GI bleed noted 5/10. PMH includes Crohn's disease, HTN, otitis media, anxiety, migraines, GERD, fibromyalgia, endometriosis, tobacco abuse, IV drug use.     PT Comments    Pt limited in her mobility by positional hypotension (See General Comments). Pt min A for supine to sit  and min guard for sit to supine. PT recommendations remain appropriate at this time. PT will continue to follow acutely.   Follow Up Recommendations  Supervision/Assistance - 24 hour;SNF     Equipment Recommendations  Other (comment)    Recommendations for Other Services       Precautions / Restrictions Precautions Precautions: Fall;Other (comment) Precaution Comments: wrist restraints and mitts Restrictions Weight Bearing Restrictions: No    Mobility  Bed Mobility Overal bed mobility: Needs Assistance Bed Mobility: Supine to Sit;Sit to Supine;Rolling     Supine to sit: HOB elevated;+2 for physical assistance;Min assist Sit to supine: Supervision   General bed mobility comments: minA for supine to sit for trunk to upright, pt with decrease in BP and min guard for returning to bed        Balance Overall balance assessment: Needs assistance Sitting-balance support: Bilateral upper extremity supported;Feet unsupported Sitting balance-Leahy Scale: Fair Sitting balance - Comments: Able to sit at EOB                                     Cognition Arousal/Alertness: Awake/alert Behavior During Therapy: Flat affect Overall Cognitive Status: Difficult to  assess Area of Impairment: Orientation;Attention;Following commands;Safety/judgement;Problem solving                 Orientation Level: Disoriented to;Situation Current Attention Level: Selective   Following Commands: Follows one step commands consistently;Follows multi-step commands with increased time Safety/Judgement: Decreased awareness of safety;Decreased awareness of deficits   Problem Solving: Slow processing;Decreased initiation;Difficulty sequencing;Requires verbal cues General Comments: Pt verbalizing quietly and able to whisper her name, DOB, that she is in the hospital and the date.          General Comments General comments (skin integrity, edema, etc.): supine BP 112/85 with sitting EoB BP 88/73 with dizziness laid back down and attempted sitting EoB again BP 80/59, returned to supine, BP 82/72, after 5 min supine BP 95/76       Pertinent Vitals/Pain Pain Assessment: Faces Faces Pain Scale: Hurts little more Pain Location: Points to abdomen when asked if any pain Pain Descriptors / Indicators: Grimacing;Guarding Pain Intervention(s): Limited activity within patient's tolerance;Monitored during session;Repositioned           PT Goals (current goals can now be found in the care plan section) Acute Rehab PT Goals Patient Stated Goal: not stated PT Goal Formulation: With patient Time For Goal Achievement: 12/28/17 Potential to Achieve Goals: Fair Progress towards PT goals: Not progressing toward goals - comment(limited by hypotension)    Frequency    Min 3X/week      PT Plan Current plan remains appropriate       AM-PAC PT "6 Clicks" Daily Activity  Outcome Measure  Difficulty  turning over in bed (including adjusting bedclothes, sheets and blankets)?: A Little Difficulty moving from lying on back to sitting on the side of the bed? : Unable Difficulty sitting down on and standing up from a chair with arms (e.g., wheelchair, bedside commode,  etc,.)?: Unable Help needed moving to and from a bed to chair (including a wheelchair)?: A Lot Help needed walking in hospital room?: Total Help needed climbing 3-5 steps with a railing? : Total 6 Click Score: 9    End of Session Equipment Utilized During Treatment: Oxygen Activity Tolerance: Treatment limited secondary to medical complications (Comment)(hypotensive) Patient left: in bed;with call bell/phone within reach;with bed alarm set;with nursing/sitter in room Nurse Communication: Mobility status;Other (comment)(hypotension) PT Visit Diagnosis: Other abnormalities of gait and mobility (R26.89);Difficulty in walking, not elsewhere classified (R26.2)     Time: 1353-1420 PT Time Calculation (min) (ACUTE ONLY): 27 min  Charges:  $Therapeutic Activity: 23-37 mins                    G Codes:       Dov Dill B. Migdalia Dk PT, DPT Acute Rehabilitation  [phone] Pager [phone]     [place] 12/18/2017, 4:05 PM

## 2017-12-19 LAB — CBC
HCT: 29.9 % — ABNORMAL LOW (ref 36.0–46.0)
Hemoglobin: 8.7 g/dL — ABNORMAL LOW (ref 12.0–15.0)
MCH: 24.5 pg — ABNORMAL LOW (ref 26.0–34.0)
MCHC: 29.1 g/dL — ABNORMAL LOW (ref 30.0–36.0)
MCV: 84.2 fL (ref 78.0–100.0)
Platelets: 991 10*3/uL (ref 150–400)
RBC: 3.55 MIL/uL — ABNORMAL LOW (ref 3.87–5.11)
RDW: 20.2 % — ABNORMAL HIGH (ref 11.5–15.5)
WBC: 21.6 10*3/uL — ABNORMAL HIGH (ref 4.0–10.5)

## 2017-12-19 LAB — COMPREHENSIVE METABOLIC PANEL
ALT: 27 U/L (ref 14–54)
AST: 16 U/L (ref 15–41)
Albumin: 2.8 g/dL — ABNORMAL LOW (ref 3.5–5.0)
Alkaline Phosphatase: 118 U/L (ref 38–126)
Anion gap: 14 (ref 5–15)
BUN: 13 mg/dL (ref 6–20)
CO2: 31 mmol/L (ref 22–32)
Calcium: 8.2 mg/dL — ABNORMAL LOW (ref 8.9–10.3)
Chloride: 92 mmol/L — ABNORMAL LOW (ref 101–111)
Creatinine, Ser: 1.26 mg/dL — ABNORMAL HIGH (ref 0.44–1.00)
GFR calc Af Amer: >60 mL/min (ref >60)
GFR calc non Af Amer: 54 mL/min — ABNORMAL LOW (ref >60)
Glucose, Bld: 119 mg/dL — ABNORMAL HIGH (ref 65–99)
Potassium: 2.8 mmol/L — ABNORMAL LOW (ref 3.5–5.1)
Sodium: 137 mmol/L (ref 135–145)
Total Bilirubin: 0.5 mg/dL (ref 0.3–1.2)
Total Protein: 5.9 g/dL — ABNORMAL LOW (ref 6.5–8.1)

## 2017-12-19 LAB — BASIC METABOLIC PANEL
Anion gap: 10 (ref 5–15)
BUN: 11 mg/dL (ref 6–20)
CO2: 26 mmol/L (ref 22–32)
Calcium: 8.8 mg/dL — ABNORMAL LOW (ref 8.9–10.3)
Chloride: 102 mmol/L (ref 101–111)
Creatinine, Ser: 1.15 mg/dL — ABNORMAL HIGH (ref 0.44–1.00)
GFR calc Af Amer: >60 mL/min (ref >60)
GFR calc non Af Amer: >60 mL/min (ref >60)
Glucose, Bld: 107 mg/dL — ABNORMAL HIGH (ref 65–99)
Potassium: 4.1 mmol/L (ref 3.5–5.1)
Sodium: 138 mmol/L (ref 135–145)

## 2017-12-19 LAB — GLUCOSE, CAPILLARY: Glucose-Capillary: 104 mg/dL — ABNORMAL HIGH (ref 65–99)

## 2017-12-19 LAB — T4, FREE: Free T4: 1.39 ng/dL (ref 0.82–1.77)

## 2017-12-19 LAB — MAGNESIUM: Magnesium: 1.4 mg/dL — ABNORMAL LOW (ref 1.7–2.4)

## 2017-12-19 MED ORDER — METOPROLOL SUCCINATE ER 100 MG PO TB24
100.0000 mg | ORAL_TABLET | Freq: Every day | ORAL | Status: DC
  Administered 2017-12-19 – 2017-12-20 (×2): 100 mg via ORAL
  Filled 2017-12-19 (×2): qty 1

## 2017-12-19 MED ORDER — VITAMIN B-1 100 MG PO TABS
100.0000 mg | ORAL_TABLET | Freq: Every day | ORAL | Status: DC
  Administered 2017-12-19 – 2017-12-20 (×2): 100 mg via ORAL
  Filled 2017-12-19 (×2): qty 1

## 2017-12-19 MED ORDER — ADULT MULTIVITAMIN W/MINERALS CH
1.0000 | ORAL_TABLET | Freq: Every day | ORAL | Status: DC
  Administered 2017-12-19 – 2017-12-20 (×2): 1 via ORAL
  Filled 2017-12-19 (×3): qty 1

## 2017-12-19 MED ORDER — FOLIC ACID 1 MG PO TABS
1.0000 mg | ORAL_TABLET | Freq: Every day | ORAL | Status: DC
  Administered 2017-12-19 – 2017-12-20 (×2): 1 mg via ORAL
  Filled 2017-12-19 (×3): qty 1

## 2017-12-19 MED ORDER — ALPRAZOLAM 0.5 MG PO TABS
0.5000 mg | ORAL_TABLET | Freq: Three times a day (TID) | ORAL | Status: DC | PRN
  Administered 2017-12-19 (×2): 0.5 mg via ORAL
  Filled 2017-12-19 (×3): qty 1

## 2017-12-19 MED ORDER — POTASSIUM CHLORIDE CRYS ER 20 MEQ PO TBCR
40.0000 meq | EXTENDED_RELEASE_TABLET | Freq: Once | ORAL | Status: AC
  Administered 2017-12-19: 40 meq via ORAL
  Filled 2017-12-19: qty 2

## 2017-12-19 MED ORDER — FAMOTIDINE 20 MG PO TABS: 20.0000 mg | ORAL_TABLET | Freq: Every day | ORAL | Status: DC

## 2017-12-19 MED ORDER — POTASSIUM CHLORIDE CRYS ER 20 MEQ PO TBCR
40.0000 meq | EXTENDED_RELEASE_TABLET | Freq: Once | ORAL | Status: AC
Start: 2017-12-19 — End: 2017-12-19
  Administered 2017-12-19: 40 meq via ORAL
  Filled 2017-12-19: qty 2

## 2017-12-19 MED ORDER — POTASSIUM CHLORIDE IN NACL 20-0.9 MEQ/L-% IV SOLN
INTRAVENOUS | Status: DC
  Administered 2017-12-19: 10:00:00 via INTRAVENOUS
  Filled 2017-12-19 (×3): qty 1000

## 2017-12-19 MED ORDER — PANTOPRAZOLE SODIUM 40 MG PO TBEC
40.0000 mg | DELAYED_RELEASE_TABLET | Freq: Two times a day (BID) | ORAL | Status: DC
  Administered 2017-12-19 – 2017-12-20 (×3): 40 mg via ORAL
  Filled 2017-12-19 (×3): qty 1

## 2017-12-19 MED ORDER — FAMOTIDINE IN NACL 20-0.9 MG/50ML-% IV SOLN: 20.0000 mg | INTRAVENOUS | Status: DC

## 2017-12-19 MED ORDER — ENSURE ENLIVE PO LIQD
237.0000 mL | Freq: Two times a day (BID) | ORAL | Status: DC
  Administered 2017-12-19 – 2017-12-20 (×2): 237 mL via ORAL

## 2017-12-19 MED ORDER — MAGNESIUM SULFATE 2 GM/50ML IV SOLN
2.0000 g | Freq: Once | INTRAVENOUS | Status: AC
  Administered 2017-12-19: 2 g via INTRAVENOUS
  Filled 2017-12-19: qty 50

## 2017-12-19 MED ORDER — TRAMADOL HCL 50 MG PO TABS
50.0000 mg | ORAL_TABLET | Freq: Once | ORAL | Status: AC
  Administered 2017-12-19: 50 mg via ORAL
  Filled 2017-12-19: qty 1

## 2017-12-19 MED ORDER — POTASSIUM CHLORIDE 10 MEQ/100ML IV SOLN
10.0000 meq | INTRAVENOUS | Status: AC
  Administered 2017-12-19 (×3): 10 meq via INTRAVENOUS
  Filled 2017-12-19 (×3): qty 100

## 2017-12-19 NOTE — Progress Notes (Signed)
Nutrition Follow-up  DOCUMENTATION CODES:   Not applicable  INTERVENTION:    Ensure Enlive po BID, each supplement provides 350 kcal and 20 grams of protein  Multivitamin daily  NUTRITION DIAGNOSIS:   Inadequate oral intake related to inability to eat as evidenced by NPO status.  ongoing  GOAL:   Patient will meet greater than or equal to 90% of their needs  Unmet  MONITOR:   Vent status, TF tolerance, Labs, I & O's  ASSESSMENT:   37 yo female with PMH of vitamin B1 deficiency, B12 deficiency, anxiety, asthma, IVDU, smoker, ADHD, HTN, GERD, Crohn's disease, fibromyalgia, and osteoarthritis who was admitted on 5/1 with PNA and sepsis. Required intubation on 5/3 due to agitation and hypoxia.  Discussed patient in ICU rounds and with RN today. Extubated 5/9. TF off. NGT removed 5/13. Has been tolerating clear liquids well. Diet advanced to regular this morning. Patient reports poor intake due to lethargy and decreased appetite. She has had Ensure in the past, "my mom makes me drink it." Prefers chocolate flavor.  Labs reviewed. Potassium 2.8 (L) Medications reviewed and include folic acid, KCl, thiamine.  Diet Order:   Diet Order           Diet regular Room service appropriate? Yes; Fluid consistency: Thin  Diet effective now          EDUCATION NEEDS:   No education needs have been identified at this time  Skin:  Skin Assessment: Reviewed RN Assessment  Last BM:  5/13  Height:   Ht Readings from Last 1 Encounters:  12/08/17 5' 2"  (1.575 m)    Weight:   Wt Readings from Last 1 Encounters:  12/19/17 130 lb 4.7 oz (59.1 kg)    Ideal Body Weight:  50 kg  BMI:  Body mass index is 23.83 kg/m.  Estimated Nutritional Needs:   Kcal:  1600-1800  Protein:  75-85 gm  Fluid:  1.8 L    Molli Barrows, RD, LDN, [place] Pager [phone] After Hours Pager [phone]

## 2017-12-19 NOTE — Progress Notes (Signed)
[place] TEAM 1 - Stepdown/ICU TEAM  [name]  WIO:[id number] DOB: 1980/08/26 DOA: 12/06/2017 PCP: Patient, [name]    Brief Narrative:  37 y/o female with Crohn's, EtOH abuse, and chronic narcotic use who was admitted with cough, chest pain, and dyspnea.  She developed ARDS and required intubation/ventilation.  Significant Events: 5/1 admit  5/3 moved to ICU - intubated, paralyzed, prone 5/9 extubated  5/10 NG > 4L bloody drainage  Subjective: The patient  removed her own NG tube 5/13.  We decided to go ahead and leave it out and see how she tolerated it.  Despite very high output from the NG prior to this she has done remarkably well without the tube in place.  She tolerated clear liquid diet and therefore this morning we are advancing her to a regular diet.  Assessment & Plan:  ARDS - Severe Rhinovirus Pneumonitis c/b aspiration  Extubated 5/9 - cont rocephin through 5/14 per ID suggestion - now on RA and stable   Acute Metabolic Encephalopathy  Likely multifactorial (substance withdrawal, ICU psychosis, etc) - B12 & folate normal, ammonia normal, RPR negative - seroquel QHS for sleep but otherwise attempt to avoid sedatives   Voluminous NG output GI has seen and does not feel she has a signif GIB - Hgb is indeed stable/climbing - she continued to put out a high volume of bile stained fluid in her NG right up until she pulled it out 5/13 - KUB w/o evidence of ileus - off reglan   Normocytic anemia - Fe deficiency Hgb holding steady - monitor - anemia panel suggests Fe deficiency - load w/ Fe during this admit   Recent Labs  Lab 12/16/17 0353 12/16/17 1450 12/16/17 2110 12/17/17 0537 12/18/17 0530 12/19/17 0501  HGB 7.8* 7.5* 7.4* 7.6* 8.9* 8.7*    AKI crt slightly increased today, likely due to transient hypotension yesterday - keep hydrated and f/u in AM    Severe Hypokalemia  Likely due to GI losses - cont to supplement   Hypomagnesemia Replace    Hypernatremia  Resolved w/ increased free water - change to isotonic IVF  Crohns - Chronic diarrhea The only thing she feels has helped her in the past is Humira, but she suffered w/ many side effects - she reports that her baseline is watery diarrhea daily   Acute pansinusitis and otomastoiditis ENT has seen - cont rocephin - no further intervention indicated   Opioid dependence Reportedly does not use IV drugs  EtOH use  Elevated TSH  FT4 normal - FT3 pending   DVT prophylaxis: SQ heparin  Code Status: FULL CODE Family Communication: no family present at time of exam    Disposition Plan: stable for tele bed - maximize lytes - ambulate - D/C home when lytes stable and pt ambulatory   Consultants:  PCCM ENT GI ID  Antimicrobials:  5/1 ceftriaxone > 5/2 5/2 azithro > 5/2 5/3 vanc > 5/4>>> 5/7>>>5/9 5/3 Mero > 5/4 >>> 5/7>>>5/9 5/4 ceftriaxone > 5/7>>>5/9>>>  Objective: Blood pressure 110/80, pulse 76, temperature 98.2 F (36.8 C), temperature source Oral, resp. rate (!) 33, height 5' 2"  (1.575 m), weight 59.1 kg (130 lb 4.7 oz), last menstrual period 12/03/2017, SpO2 94 %.  Intake/Output Summary (Last 24 hours) at 12/19/2017 0834 Last data filed at 12/19/2017 0800 Gross per 24 hour  Intake 2661.25 ml  Output 2825 ml  Net -163.75 ml   Filed Weights   12/17/17 0500 12/18/17 0139 12/19/17 0440  Weight: 58.4 kg (128 lb 12 oz) 57.8 kg (127 lb 6.8 oz) 59.1 kg (130 lb 4.7 oz)    Examination: General: No acute respiratory distress - A&O x4 Lungs: CTA B - no wheezing or crackles  Cardiovascular: RRR - no M or rub   Abdomen: NT/ND, soft, bs+, no mass, no rebound  Extremities: No C/C/E B LE   CBC: Recent Labs  Lab 12/16/17 1450 12/16/17 2110 12/17/17 0537 12/18/17 0530 12/19/17 0501  WBC 29.8* 25.3* 23.0* 21.6* 21.6*  NEUTROABS 23.2* 19.1* 17.3*  --   --   HGB 7.5* 7.4* 7.6* 8.9* 8.7*  HCT 27.3* 26.9* 27.4* 31.3* 29.9*  MCV 86.4 87.1 87.0 85.1 84.2  PLT  716* 760* 789* 1,028* 357*   Basic Metabolic Panel: Recent Labs  Lab 12/14/17 0335  12/16/17 0353 12/16/17 0605 12/17/17 0537 12/18/17 0530 12/19/17 0501  NA 143   < > 151* 149*  --  144 137  K 4.4   < > 3.6 3.6  --  2.3* 2.8*  CL 110   < > 107 105  --  99* 92*  CO2 26   < > 36* 34*  --  28 31  GLUCOSE 143*   < > 125* 123*  --  130* 119*  BUN 20   < > 20 19  --  10 13  CREATININE 0.72   < > 1.05* 1.04*  --  1.13* 1.26*  CALCIUM 8.6*   < > 8.2* 8.2*  --  8.9 8.2*  MG 2.3  --  2.5*  --  2.2  --  1.4*  PHOS 4.1  --  2.7  --  3.3  --   --    < > = values in this interval not displayed.   GFR: Estimated Creatinine Clearance: 48.8 mL/min (A) (by C-G formula based on SCr of 1.26 mg/dL (H)).  Liver Function Tests: Recent Labs  Lab 12/16/17 0353 12/18/17 0530 12/19/17 0501  AST 20 17 16   ALT 46 32 27  ALKPHOS 146* 128* 118  BILITOT 0.4 0.4 0.5  PROT 5.5* 6.4* 5.9*  ALBUMIN 2.1* 2.9* 2.8*   HbA1C: Hgb A1c MFr Bld  Date/Time Value Ref Range Status  10/10/2013 05:00 AM 5.4 <5.7 % Final    Comment:    (NOTE)                                                                       According to the ADA Clinical Practice Recommendations for 2011, when HbA1c is used as a screening test:  >=6.5%   Diagnostic of Diabetes Mellitus           (if abnormal result is confirmed) 5.7-6.4%   Increased risk of developing Diabetes Mellitus References:Diagnosis and Classification of Diabetes Mellitus,Diabetes IXBO,4784,12(KSKSH 1):S62-S69 and Standards of Medical Care in         Diabetes - 2011,Diabetes NGIT,1959,74 (Suppl 1):S11-S61.  10/25/2010 04:59 AM (H) <5.7 % Final   5.7 (NOTE)  According to the ADA Clinical Practice Recommendations for 2011, when HbA1c is used as a screening test:   >=6.5%   Diagnostic of Diabetes Mellitus           (if abnormal result  is confirmed)  5.7-6.4%   Increased risk of developing Diabetes  Mellitus  References:Diagnosis and Classification of Diabetes Mellitus,Diabetes TDVV,6160,73(XTGGY 1):S62-S69 and Standards of Medical Care in         Diabetes - 2011,Diabetes IRSW,5462,70  (Suppl 1):S11-S61.    CBG: Recent Labs  Lab 12/14/17 0723 12/14/17 1233 12/14/17 1929 12/15/17 1513 12/16/17 2343  GLUCAP 135* 103* 108* 122* 94    Recent Results (from the past 240 hour(s))  Culture, expectorated sputum-assessment     Status: None   Collection Time: 12/12/17  8:38 AM  Result Value Ref Range Status   Specimen Description SPUTUM  Final   Special Requests NONE  Final   Sputum evaluation   Final    THIS SPECIMEN IS ACCEPTABLE FOR SPUTUM CULTURE Performed at [facility], [place] [address], [place], [place] [address]    Report Status 12/12/2017 FINAL  Final  Urine Culture     Status: None   Collection Time: 12/12/17  8:38 AM  Result Value Ref Range Status   Specimen Description URINE, CATHETERIZED  Final   Special Requests NONE  Final   Culture   Final    NO GROWTH Performed at [facility], [place] [address] [place], [place] [address]    Report Status 12/13/2017 FINAL  Final  Culture, respiratory (NON-Expectorated)     Status: None   Collection Time: 12/12/17  8:38 AM  Result Value Ref Range Status   Specimen Description SPUTUM  Final   Special Requests NONE Reflexed from [id number]  Final   Gram Stain   Final    MODERATE WBC PRESENT, PREDOMINANTLY PMN NO ORGANISMS SEEN    Culture   Final    NO GROWTH 2 DAYS Performed at [facility], [place] [address] [place], [place] [address]    Report Status 12/14/2017 FINAL  Final  Culture, blood (Routine X 2) w Reflex to ID Panel     Status: None   Collection Time: 12/12/17  9:09 AM  Result Value Ref Range Status   Specimen Description BLOOD LEFT ANTECUBITAL  Final   Special Requests   Final    BOTTLES DRAWN AEROBIC ONLY Blood Culture adequate volume   Culture   Final    NO GROWTH 5 DAYS Performed at [facility], [place] [address] [place], [place] [address]    Report Status 12/17/2017 FINAL  Final  Culture, blood (Routine X 2) w Reflex to ID Panel     Status: None   Collection Time: 12/12/17  9:10 AM  Result Value Ref Range Status   Specimen Description BLOOD BLOOD RIGHT FOREARM  Final   Special Requests   Final    BOTTLES DRAWN AEROBIC ONLY Blood Culture adequate volume   Culture   Final    NO GROWTH 5 DAYS Performed at [facility], [place] [address], [place],  [address]    Report Status 12/17/2017 FINAL  Final     Scheduled Meds: . budesonide (PULMICORT) nebulizer solution  0.5 mg Nebulization BID  . folic acid  1 mg Intravenous Daily  . heparin  5,000 Units Subcutaneous Q8H  . mouth rinse  15 mL Mouth Rinse BID  . metoprolol tartrate  5 mg Intravenous  Q6H  . sodium chloride flush  10-40 mL Intracatheter Q12H  . thiamine injection  100 mg Intravenous Daily     LOS: 12 days   Cherene Altes, MD [facility] Office  [phone] Pager - Text Page per Shea Evans as per below:  On-Call/Text Page:      [email or web address]      password [email or web address]  If 7PM-7AM, please contact night-coverage [email or web address] Password [email or web address] 12/19/2017, 8:34 AM

## 2017-12-19 NOTE — Progress Notes (Signed)
Patient limited with mobility yesterday during therapy with her hypotension. Not yet at a level to consider an inpt rehab admission. I will follow up tomorrow. [phone]

## 2017-12-20 LAB — CBC
HCT: 30.5 % — ABNORMAL LOW (ref 36.0–46.0)
Hemoglobin: 8.4 g/dL — ABNORMAL LOW (ref 12.0–15.0)
MCH: 24.5 pg — ABNORMAL LOW (ref 26.0–34.0)
MCHC: 27.5 g/dL — ABNORMAL LOW (ref 30.0–36.0)
MCV: 88.9 fL (ref 78.0–100.0)
Platelets: 889 10*3/uL — ABNORMAL HIGH (ref 150–400)
RBC: 3.43 MIL/uL — ABNORMAL LOW (ref 3.87–5.11)
RDW: 20.4 % — ABNORMAL HIGH (ref 11.5–15.5)
WBC: 14.8 10*3/uL — ABNORMAL HIGH (ref 4.0–10.5)

## 2017-12-20 LAB — COMPREHENSIVE METABOLIC PANEL
ALT: 26 U/L (ref 14–54)
AST: 16 U/L (ref 15–41)
Albumin: 2.6 g/dL — ABNORMAL LOW (ref 3.5–5.0)
Alkaline Phosphatase: 103 U/L (ref 38–126)
Anion gap: 11 (ref 5–15)
BUN: 8 mg/dL (ref 6–20)
CO2: 19 mmol/L — ABNORMAL LOW (ref 22–32)
Calcium: 8.1 mg/dL — ABNORMAL LOW (ref 8.9–10.3)
Chloride: 109 mmol/L (ref 101–111)
Creatinine, Ser: 1.19 mg/dL — ABNORMAL HIGH (ref 0.44–1.00)
GFR calc Af Amer: >60 mL/min (ref >60)
GFR calc non Af Amer: 58 mL/min — ABNORMAL LOW (ref >60)
Glucose, Bld: 90 mg/dL (ref 65–99)
Potassium: 3.6 mmol/L (ref 3.5–5.1)
Sodium: 139 mmol/L (ref 135–145)
Total Bilirubin: 0.4 mg/dL (ref 0.3–1.2)
Total Protein: 5.5 g/dL — ABNORMAL LOW (ref 6.5–8.1)

## 2017-12-20 LAB — MAGNESIUM: Magnesium: 1.9 mg/dL (ref 1.7–2.4)

## 2017-12-20 LAB — T3, FREE: T3, Free: 2.9 pg/mL (ref 2.0–4.4)

## 2017-12-20 MED ORDER — POTASSIUM CHLORIDE CRYS ER 20 MEQ PO TBCR: 20.0000 meq | EXTENDED_RELEASE_TABLET | Freq: Every day | ORAL | 1 refills | Status: DC

## 2017-12-20 MED ORDER — ESOMEPRAZOLE MAGNESIUM 40 MG PO CPDR: 40.0000 mg | DELAYED_RELEASE_CAPSULE | Freq: Two times a day (BID) | ORAL | 1 refills | Status: DC

## 2017-12-20 MED ORDER — ALPRAZOLAM 0.5 MG PO TABS: 0.5000 mg | ORAL_TABLET | Freq: Three times a day (TID) | ORAL | 0 refills | Status: DC | PRN

## 2017-12-20 MED ORDER — METOPROLOL SUCCINATE ER 50 MG PO TB24: 50.0000 mg | ORAL_TABLET | Freq: Every day | ORAL | 1 refills | Status: DC

## 2017-12-20 MED ORDER — ADULT MULTIVITAMIN W/MINERALS CH: 1.0000 | ORAL_TABLET | Freq: Every day | ORAL | 1 refills | Status: DC

## 2017-12-20 MED ORDER — MAGNESIUM OXIDE 400 MG PO TABS: 400.0000 mg | ORAL_TABLET | Freq: Two times a day (BID) | ORAL | 1 refills | Status: DC

## 2017-12-20 MED ORDER — POTASSIUM CHLORIDE CRYS ER 20 MEQ PO TBCR
40.0000 meq | EXTENDED_RELEASE_TABLET | Freq: Once | ORAL | Status: AC
  Administered 2017-12-20: 40 meq via ORAL
  Filled 2017-12-20: qty 2

## 2017-12-20 NOTE — Discharge Summary (Signed)
Physician Discharge Summary  [name] HRC:[id number] DOB: 12/10/1980 DOA: 12/06/2017  PCP: Patient, [name]  Admit date: 12/06/2017 Discharge date: 12/20/2017  Admitted From: home Disposition:  Home (CIR was recommended however patient declined and chose to go home instead)  Recommendations for Outpatient Follow-up:  1. Follow up with PCP in 1-2 weeks.  Patient is looking to establish care 2. Follow-up with gastroenterology for Crohn's disease, patient is looking to establish care as she is lost to follow-up 3. Please obtain BMP/CBC in one week  Home Health: PT Equipment/Devices: none  Discharge Condition: stable CODE STATUS: Full code Diet recommendation: regular  HPI: Per Dr. Blaine Hamper, [name] is a 37 y.o. female with medical history significant of Crohn's disease, hypertension, otitis media, GERD, anxiety, migraine headache, fibromyalgia, endometriosis, tobacco abuse, IV drug use, who presents with cough, chest pain, hearing loss, ear pain, nausea, vomiting, diarrhea, tingling in hands and face. Patient states that he has been having cough, runny nose, sore throat in the past 4 days.  She also has bilateral ear pain, ear draining and hearing loss. She has chest pain, which is located from the chest, constant, sharp, 8 out of 10 in severity, nonradiating, pleuritic, aggravated by coughing and deep breaths.  Patient does not have fever, but has chills.  She states that she is not taking medications for Crohn's disease, and has been having diarrhea, nausea, vomiting and abdominal pain.  She has mild diffuse abdominal pain.  She vomited more than 10 times yesterday.  She states that she has more than 10 watery diarrhea each day.  Denies symptoms of UTI or unilateral weakness. ED Course: pt was found to have WBC 36.2, lactic acid of 1.56, lipase 18. Pregnancy test hCG 46.3, but the repeated serum beta hCG is 3.0.  Negative flu PCR, potassium 2.0, magnesium 0.4, calcium 5.0, acute  renal injury with creatinine 1.94, temperature normal, tachycardia, tachypnea, oxygen saturation 98% on room air, chest x-ray negative for infiltration.  CT abdomen/pelvis is negative, did not show obstruction or wall thickening.  Patient is admitted to telemetry bed as inpatient.   Hospital Course: ARDS - Patient admitted to the hospital with cough, shortness of breath, she was found to have severe rhinovirus pneumonitis complicated by aspiration, developed ARDS requiring ICU transfer and mechanical ventilation.  She was eventually extubated on 5/9, and eventually she was weaned off to room air and has remained stable with a back to baseline respiratory status.  On the day of discharge her breathing is back to normal, she is comfortable on room air. Acute encephalopathy -likely multifactorial in the setting of substance withdrawal (she uses alcohol), ICU psychosis.  Resolved, currently alert and oriented x4 Crohn's disease /chronic hypokalemia and hypomagnesemia-patient used to be followed at [facility] for gastroenterology, has not followed in a while.  She has a degree of chronic diarrhea and is on chronic potassium and magnesium supplementation.  She is noncompliant with this medications as she ran out and has not seeked to have refills.  She tells me her potassium stays in the 2 range when she is not getting her supplements.  Patient strongly determined to reestablish care with her gastroenterologist.  Her potassium as well as magnesium are now normalized, will give her potassium and magnesium prescriptions on discharge Acute pansinusitis and otomastoiditis -infectious disease as well as ENT evaluated patient.  They recommended ceftriaxone up until 5/14, she finished a course while hospitalized.  Currently has no complaints, no fevers, and  leukocytosis is improving Polysubstance abuse -reports smoking, determined to quit and will not smoke when she gets home.  Reports alcohol abuse and feels like this  was a wake-up call and does not plan to drink again.  She denies use of IV drugs Elevated TSH -with normal free T3 and T4, recommend repeat TSH in 3 to 4 weeks as an outpatient Hypernatremia -resolved with free water Acute kidney injury -likely in the setting of intermittent hypotension/ICU stay, her creatinine has been overall stable over the last 5 days, she is able to eat and drink well, her chronic diarrhea is back to baseline, was advised to have repeat blood work in her PCPs office within the next week or 2 Voluminous NG output -concerns for GI bleed, gastroenterology was consulted and did not feel that she has a significant GI bleed, hemoglobin has been stable and improving on its own.  She self discontinued her NG tube on 5/13, had no abdominal discomfort, nausea or vomiting and is tolerating a regular diet.   Discharge Diagnoses:  Principal Problem:   Bronchitis Active Problems:   Anxiety state   Essential hypertension   GERD   CROHN'S DISEASE-SMALL INTESTINE   Abdominal pain, generalized   Hypokalemia   Hypocalcemia   Hypomagnesemia   Sepsis ([place])   AKI (acute kidney injury) ([place])   Acute sinusitis   Acute respiratory failure with hypoxia (HCC)   Acute respiratory failure with hypoxemia (HCC)   ARDS (adult respiratory distress syndrome) (HCC)   Leukocytosis   Tobacco abuse   Alcohol abuse   Polysubstance abuse (HCC)   Attention deficit hyperactivity disorder (ADHD)   Vitamin B12 deficiency   Fibromyalgia   Crohn's disease with complication ([place])   Uncomplicated asthma   Tachycardia   Tachypnea   Acute blood loss anemia   Thrombocytosis (HCC)   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   Doxycycline Other (See Comments)   Patient cannot recall the reaction      Medication List    STOP taking these medications   ciprofloxacin 500 MG tablet Commonly known as:  CIPRO   clindamycin 300 MG capsule Commonly known as:  CLEOCIN   fexofenadine 180 MG tablet Commonly known as:  ALLEGRA   ondansetron 4 MG disintegrating tablet Commonly known as:  ZOFRAN ODT   oxyCODONE 5 MG immediate release tablet Commonly known as:  ROXICODONE   oxyCODONE-acetaminophen 5-325 MG tablet Commonly known as:  PERCOCET/ROXICET   pantoprazole 40 MG tablet Commonly known as:  PROTONIX   polyethylene glycol packet Commonly known as:  MIRALAX / GLYCOLAX     TAKE these medications   ALPRAZolam 0.5 MG tablet Commonly known as:  XANAX Take 1 tablet (0.5 mg total) by mouth 3 (three) times daily as needed for anxiety. What changed:    medication strength  how much to take   budesonide 3 MG 24 hr capsule Commonly known as:  ENTOCORT EC Take 3 capsules (9 mg total) by  mouth daily.   esomeprazole 40 MG capsule Commonly known as:  NEXIUM Take 1 capsule (40 mg total) by mouth 2 (two) times daily before a meal.   magnesium oxide 400 MG tablet Commonly known as:  MAG-OX Take 1 tablet (400 mg total) by mouth 2 (two) times daily.   metoprolol succinate 50 MG 24 hr tablet Commonly known as:  TOPROL-XL Take 1 tablet (50 mg total) by mouth daily. Take with or immediately following a meal. What changed:    medication strength  how much to take   multivitamin with minerals Tabs tablet Take 1 tablet by mouth daily. Start taking on:  12/21/2017   naproxen sodium 220 MG tablet Commonly known as:  ALEVE Take 220-440 mg by mouth 2 (two)  times daily as needed (for pain or discomfort).   potassium chloride SA 20 MEQ tablet Commonly known as:  K-DUR,KLOR-CON Take 1 tablet (20 mEq total) by mouth daily.      Follow-up Information    PCP in 1-2 weeks Follow up.        [facility] Follow up.   Why:  home health PT Contact information: [address] [place] [address] [phone]           Consultations:  PCCM  GI  ID  ENT  Procedures/Studies:  Ct Abdomen Pelvis Wo Contrast  Result Date: 12/07/2017 CLINICAL DATA:  Inflammatory bowel disease. EXAM: CT ABDOMEN AND PELVIS WITHOUT CONTRAST TECHNIQUE: Multidetector CT imaging of the abdomen and pelvis was performed following the standard protocol without IV contrast. COMPARISON:  09/28/2017 FINDINGS: Lower chest: Lung bases are clear. Bilateral breast implants. Fluid in the distal esophagus may indicate reflux or dysmotility. Hepatobiliary: Gallbladder is somewhat distended without wall thickening or inflammatory changes. No stones are identified. No bile duct dilatation. No focal liver lesions. Pancreas: Unremarkable. No pancreatic ductal dilatation or surrounding inflammatory changes. Spleen: Normal in size without focal abnormality. Adrenals/Urinary Tract: Adrenal glands are unremarkable. Kidneys are normal, without renal calculi, focal lesion, or hydronephrosis. Bladder is unremarkable. Stomach/Bowel: Hyperdense ingested material within the stomach. Stomach, small bowel, and colon are mostly decompressed. Postoperative changes in the right colon likely representing a partial right hemicolectomy and ileocolonic anastomosis. No definite bowel wall thickening although under distention limits evaluation. Vascular/Lymphatic: No significant vascular findings are present. No enlarged abdominal or pelvic lymph nodes. Reproductive: Uterus and bilateral adnexa are unremarkable. Other: No abdominal wall hernia or abnormality. No abdominopelvic  ascites. Musculoskeletal: No acute or significant osseous findings. IMPRESSION: No acute process demonstrated in the abdomen or pelvis. No evidence of bowel obstruction or inflammation. No bowel wall thickening is identified but under distention limits evaluation. Mild gallbladder distention without inflammatory changes or stones, likely physiologic. Electronically Signed   By: Lucienne Capers M.D.   On: 12/07/2017 00:46   Dg Chest 2 View  Result Date: 12/06/2017 CLINICAL DATA:  Cough, shortness of breath, chest pain EXAM: CHEST - 2 VIEW COMPARISON:  08/13/2017 FINDINGS: Lungs are clear.  No pleural effusion or pneumothorax. The heart is normal in size. Visualized osseous structures are within normal limits. IMPRESSION: Normal chest radiographs. Electronically Signed   By: Julian Hy M.D.   On: 12/06/2017 20:07   Dg Chest Port 1 View  Result Date: 12/16/2017 CLINICAL DATA:  Thal respiratory distress syndrome EXAM: PORTABLE CHEST 1 VIEW COMPARISON:  Dec 14, 2017 FINDINGS: The heart size and mediastinal contours are within normal limits. Left central venous line is identified distal tip  in the superior vena cava. A nasogastric tube is noted with distal tip not included on film but is at least in the stomach. Both lungs are clear. The visualized skeletal structures are unremarkable. IMPRESSION: No active cardiopulmonary disease. Electronically Signed   By: Abelardo Diesel M.D.   On: 12/16/2017 07:29   Dg Chest Port 1 View  Result Date: 12/14/2017 CLINICAL DATA:  Check endotracheal tube placement EXAM: PORTABLE CHEST 1 VIEW COMPARISON:  12/13/2017 FINDINGS: Cardiac shadow is stable. Endotracheal tube and nasogastric catheter are noted in satisfactory position. Lungs are well aerated bilaterally. Considerable overlying artifact is seen. The vascular congestion seen previously has improved slightly in the interval from the prior exam. IMPRESSION: Mild improved vascular congestion. No new focal abnormality  is noted. Electronically Signed   By: Inez Catalina M.D.   On: 12/14/2017 10:12   Dg Chest Port 1 View  Result Date: 12/13/2017 CLINICAL DATA:  37 year old female-evaluate endotracheal tube placement. EXAM: PORTABLE CHEST 1 VIEW COMPARISON:  12/13/2017 and prior radiographs FINDINGS: Cardiomediastinal silhouette is unchanged. Pulmonary vascular congestion again noted. Endotracheal tube is identified with tip extending into the RIGHT mainstem bronchus-recommend 3 cm retraction. NG tube enters stomach and LEFT IJ central venous catheter again noted with tip overlying the UPPER SVC. No other changes identified. There is no evidence of pneumothorax. IMPRESSION: 1. Endotracheal tube with tip extending into the RIGHT mainstem bronchus-recommend 3 cm retraction. No other significant change. Apolonio Schneiders, nurse for this patient, was notified of these results on 12/13/2017 at 1:05 p.m. Electronically Signed   By: Margarette Canada M.D.   On: 12/13/2017 13:11   Dg Chest Port 1 View  Result Date: 12/13/2017 CLINICAL DATA:  Shortness of breath EXAM: PORTABLE CHEST 1 VIEW COMPARISON:  12/12/2017 FINDINGS: Cardiac shadow is stable. Endotracheal tube, left jugular central line and nasogastric catheter are again seen and stable. The lungs are well aerated bilaterally with improved aeration in the bases bilaterally. Some residual atelectatic changes are noted in the bases. No new focal abnormality is seen. IMPRESSION: Improved aeration with some residual bibasilar atelectasis. Electronically Signed   By: Inez Catalina M.D.   On: 12/13/2017 09:00   Dg Chest Port 1 View  Result Date: 12/12/2017 CLINICAL DATA:  37 year old female intubated.  Subsequent encounter. EXAM: PORTABLE CHEST 1 VIEW COMPARISON:  12/11/2017 chest x-ray. FINDINGS: Endotracheal tube tip 3 cm above the carina. Left central line tip proximal superior vena cava level and may impress upon the lateral border of the superior vena cava. Appearance unchanged. Nasogastric tube  tip and side hole curled within the gastric fundus. Slightly asymmetric airspace disease similar to prior exam and may represent pulmonary edema. Infectious process not excluded in proper clinical setting. Left base subsegmental atelectasis. No pneumothorax noted. Heart size top-normal. IMPRESSION: Endotracheal tube tip now 3 cm above the carina. Left central line tip proximal superior vena cava level and may impress upon the lateral border of the superior vena cava. Appearance unchanged. Similar appearance of asymmetric airspace disease possibly representing pulmonary edema although infectious infiltrate not excluded in the proper clinical setting. Electronically Signed   By: Genia Del M.D.   On: 12/12/2017 09:19   Dg Chest Port 1 View  Result Date: 12/11/2017 CLINICAL DATA:  Acute respiratory failure EXAM: PORTABLE CHEST 1 VIEW COMPARISON:  12/10/2017 FINDINGS: Cardiac shadow is stable. Endotracheal tube, nasogastric catheter and left jugular central line are again seen and stable. Patchy infiltrates are noted bilaterally relatively stable from the previous exam. No bony abnormality  is noted. IMPRESSION: No change from the prior exam Electronically Signed   By: Inez Catalina M.D.   On: 12/11/2017 07:49   Dg Chest Port 1 View  Result Date: 12/10/2017 CLINICAL DATA:  37 year old female with history of acute respiratory failure with hypoxemia. EXAM: PORTABLE CHEST 1 VIEW COMPARISON:  Chest x-ray 12/09/2017. FINDINGS: An endotracheal tube is in place with tip 2.2 cm above the carina. There is a left-sided internal jugular central venous catheter with tip terminating in the mid superior vena cava. Nasogastric tube extends into the stomach with the tip coiled in the proximal stomach. Lung volumes are low. Diffuse but patchy interstitial and airspace disease is again noted asymmetrically distributed throughout the lungs bilaterally, most confluent throughout the lung bases and in the medial aspect of the left  upper lobe. Slight improved aeration in the left upper lobe compared with yesterday's examination. Pulmonary vasculature is obscured. Heart size appears normal. Upper mediastinal contours are largely obscured. IMPRESSION: 1. Support apparatus, as above. 2. Patchy asymmetrically distributed interstitial and airspace disease in the lungs bilaterally, similar to the prior study, favored to reflect severe multilobar pneumonia. Overall, aeration has slightly improved, particularly in the left upper lobe. Electronically Signed   By: Vinnie Langton M.D.   On: 12/10/2017 08:05   Dg Chest Port 1 View  Result Date: 12/09/2017 CLINICAL DATA:  ET tube placement. EXAM: PORTABLE CHEST 1 VIEW COMPARISON:  Chest x-ray from same day at 5 a.m. FINDINGS: Interval advancement of the endotracheal tube with the tip now 4.3 cm above the carina. Unchanged left internal jugular central venous catheter and enteric tube. Stable obscuration of the cardiac silhouette. Diffuse, lower lobe predominant airspace disease bilaterally, worse on the left, is similar to slightly improved. No pneumothorax. No acute osseous abnormality. IMPRESSION: 1. Diffuse bilateral airspace disease, worse on the left, is stable to slightly improved, particularly in the left upper lobe. Electronically Signed   By: Titus Dubin M.D.   On: 12/09/2017 09:38   Dg Chest Port 1 View  Result Date: 12/09/2017 CLINICAL DATA:  37 year old female with history of acute respiratory failure. EXAM: PORTABLE CHEST 1 VIEW COMPARISON:  Chest x-ray 12/08/2017. FINDINGS: An endotracheal tube is in place with tip 9.3 cm above the carina. There is a left-sided internal jugular central venous catheter with tip terminating in the mid superior vena cava. Nasogastric tube coiled in the proximal stomach. Widespread but patchy and asymmetrically distributed interstitial and airspace disease throughout the lungs bilaterally concerning for severe multilobar pneumonia. Cardiac silhouette  is obscured. Probable small right pleural effusion. IMPRESSION: 1. Support apparatus, as above. 2. The appearance of the lungs is most compatible with severe multilobar pneumonia. Probable small right pleural effusion. Electronically Signed   By: Vinnie Langton M.D.   On: 12/09/2017 08:31   Dg Chest Port 1 View  Result Date: 12/08/2017 CLINICAL DATA:  New central line placement EXAM: PORTABLE CHEST 1 VIEW COMPARISON:  Portable chest of 12/08/2016 FINDINGS: The tip of the endotracheal tube is approximately 2.1 cm above the carina. A new left IJ central venous line has been inserted with the tip overlying the expected SVC-RA junction. No pneumothorax is seen. There has been interval worsening of diffuse airspace disease most consistent with worsening of edema, although superimposed pneumonia cannot be excluded. NG tube coils in the fundus of the stomach. IMPRESSION: 1. New left central venous line tip overlies expected SVC-RA junction. 2. Worsening of diffuse airspace disease. Probable worsening of edema although superimposed pneumonia cannot  be excluded. 3. Endotracheal tube tip 2.1 cm above the carina. Electronically Signed   By: Ivar Drape M.D.   On: 12/08/2017 09:08   Dg Chest Port 1 View  Result Date: 12/08/2017 CLINICAL DATA:  36 year old female status post intubation and gastric tube placement EXAM: PORTABLE CHEST 1 VIEW COMPARISON:  Prior chest x-ray 12/06/2017 FINDINGS: The patient is intubated. The tip of the endotracheal tube is at the level of the carina and overrides the right mainstem bronchus. Recommend withdrawing 3-4 cm for. The gastric tube is well positioned and coiled in the gastric bubble. Extensive bilateral interstitial and airspace opacities most confluent in the left upper lobe. No pneumothorax. No definite pleural effusion. No acute osseous abnormality. The visualized bowel gas pattern is unremarkable. IMPRESSION: 1. The tip of the endotracheal tube overrides the right mainstem  bronchus. Recommend withdrawing 3-4 cm for more optimal placement. 2. Relatively rapid interval progression of diffuse bilateral interstitial and airspace opacities most confluent in the left upper lobe. Differential considerations include large volume aspiration, aggressive multifocal pneumonia, and less likely multifocal alveolar hemorrhage or acute lung injury (ARDS, alveolitis). 3. Well-positioned gastric tube. These results will be called to the ordering clinician or representative by the Radiologist Assistant, and communication documented in the PACS or zVision Dashboard. Electronically Signed   By: Jacqulynn Cadet M.D.   On: 12/08/2017 08:12   Dg Abd Portable 1v  Result Date: 12/18/2017 CLINICAL DATA:  Abdominal pain and ileus EXAM: PORTABLE ABDOMEN - 1 VIEW COMPARISON:  12/14/2017 FINDINGS: Scattered large and small bowel gas is noted. Nasogastric catheter is noted within the stomach similar to that seen on the prior exam. No bony abnormality is noted. IMPRESSION: No acute abnormality seen. Electronically Signed   By: Inez Catalina M.D.   On: 12/18/2017 09:19   Dg Abd Portable 1v  Result Date: 12/14/2017 CLINICAL DATA:  NG tube placement EXAM: PORTABLE ABDOMEN - 1 VIEW COMPARISON:  12/10/2017 FINDINGS: Enteric tube terminates in the proximal gastric body. Lung bases are clear. IMPRESSION: Enteric tube terminates in the proximal gastric body. Electronically Signed   By: Julian Hy M.D.   On: 12/14/2017 23:52   Dg Abd Portable 1v  Result Date: 12/10/2017 CLINICAL DATA:  37 year old female with history of constipation. EXAM: PORTABLE ABDOMEN - 1 VIEW COMPARISON:  No priors. FINDINGS: Nasogastric tube in position with tip in the proximal stomach. Single view of the abdomen demonstrates some gas and stool throughout the colon and rectum. No pathologic dilatation of small bowel or colon. No gross pneumoperitoneum on this single supine view. Suture line noted in the right lower quadrant. Overall,  stool burden does not appear excessive. IMPRESSION: 1. Nonobstructive bowel gas pattern. 2. No pneumoperitoneum. 3. Stool burden does not appear excessive despite the reported history of constipation. Electronically Signed   By: Vinnie Langton M.D.   On: 12/10/2017 10:27   Ct Maxillofacial Wo Contrast  Result Date: 12/07/2017 CLINICAL DATA:  Bilateral nasal congestion with productive cough and shortness of breath EXAM: CT MAXILLOFACIAL WITHOUT CONTRAST TECHNIQUE: Multidetector CT imaging of the maxillofacial structures was performed. Multiplanar CT image reconstructions were also generated. COMPARISON:  12/17/2009 head CT FINDINGS: Osseous: Negative for fracture or destructive change. Orbits: Negative. No traumatic or inflammatory finding. Sinuses: Diffuse mucosal thickening with maxillary and sphenoid sinus fluid levels. Both ostiomeatal units are opacified by mucosal thickening. Bilateral mastoid subtotal opacification with negative nasopharynx. There is left middle ear opacification. No erosive changes. Soft tissues: Negative Limited intracranial: Negative IMPRESSION: 1.  Generalized acute sinusitis with fluid levels. 2. Bilateral mastoid and left middle ear opacification, possible otomastoiditis. Electronically Signed   By: Monte Fantasia M.D.   On: 12/07/2017 11:39      Subjective: -Feeling much better, back to baseline, ambulating in the room and asking to go home.  Tolerating a regular diet, no nausea or vomiting.  Discharge Exam: Vitals:   12/20/17 0413 12/20/17 0417  BP: 116/78   Pulse: 80   Resp:    Temp:  98.6 F (37 C)  SpO2: 96%     General: Pt is alert, awake, not in acute distress Cardiovascular: RRR, S1/S2 +, no rubs, no gallops Respiratory: CTA bilaterally, no wheezing, no rhonchi Abdominal: Soft, NT, ND, bowel sounds + Extremities: no edema, no cyanosis    The results of significant diagnostics from this hospitalization (including imaging, microbiology, ancillary and  laboratory) are listed below for reference.     Microbiology: Recent Results (from the past 240 hour(s))  Culture, expectorated sputum-assessment     Status: None   Collection Time: 12/12/17  8:38 AM  Result Value Ref Range Status   Specimen Description SPUTUM  Final   Special Requests NONE  Final   Sputum evaluation   Final    THIS SPECIMEN IS ACCEPTABLE FOR SPUTUM CULTURE Performed at [facility], [address] [place], [place] [address]    Report Status 12/12/2017 FINAL  Final  Urine Culture     Status: None   Collection Time: 12/12/17  8:38 AM  Result Value Ref Range Status   Specimen Description URINE, CATHETERIZED  Final   Special Requests NONE  Final   Culture   Final    NO GROWTH Performed at [facility], [place] [address] [place], [place] [id number]    Report Status 12/13/2017 FINAL  Final  Culture, respiratory (NON-Expectorated)     Status: None   Collection Time: 12/12/17  8:38 AM  Result Value Ref Range Status   Specimen Description SPUTUM  Final   Special Requests NONE Reflexed from [id number]  Final   Gram Stain   Final    MODERATE WBC PRESENT, PREDOMINANTLY PMN NO ORGANISMS SEEN    Culture   Final    NO GROWTH 2 DAYS Performed at [facility], [place] [address], [place], [place] [address]    Report Status 12/14/2017 FINAL  Final  Culture, blood (Routine X 2) w Reflex to ID Panel     Status: None   Collection Time: 12/12/17  9:09 AM  Result Value Ref Range Status   Specimen Description BLOOD LEFT ANTECUBITAL  Final   Special Requests   Final    BOTTLES DRAWN AEROBIC ONLY Blood Culture adequate volume   Culture   Final    NO GROWTH 5 DAYS Performed at [facility], [place] [address], [place], [place] [address]    Report Status 12/17/2017 FINAL  Final  Culture, blood (Routine X 2) w Reflex to ID Panel     Status: None   Collection Time: 12/12/17  9:10 AM  Result Value Ref Range Status   Specimen Description BLOOD BLOOD RIGHT FOREARM   Final   Special Requests   Final    BOTTLES DRAWN AEROBIC ONLY Blood Culture adequate volume   Culture   Final    NO GROWTH 5 DAYS Performed at [facility], [place] [address] [place],  [address]    Report Status 12/17/2017 FINAL  Final  Labs: BNP (last 3 results) Recent Labs    12/08/17 0748  BNP 242.6*   Basic Metabolic Panel: Recent Labs  Lab 12/14/17 0335  12/16/17 0353 12/16/17 0605 12/17/17 0537 12/18/17 0530 12/19/17 0501 12/19/17 1842 12/20/17 0502  NA 143   < > 151* 149*  --  144 137 138 139  K 4.4   < > 3.6 3.6  --  2.3* 2.8* 4.1 3.6  CL 110   < > 107 105  --  99* 92* 102 109  CO2 26   < > 36* 34*  --  28 31 26  19*  GLUCOSE 143*   < > 125* 123*  --  130* 119* 107* 90  BUN 20   < > 20 19  --  10 13 11 8   CREATININE 0.72   < > 1.05* 1.04*  --  1.13* 1.26* 1.15* 1.19*  CALCIUM 8.6*   < > 8.2* 8.2*  --  8.9 8.2* 8.8* 8.1*  MG 2.3  --  2.5*  --  2.2  --  1.4*  --  1.9  PHOS 4.1  --  2.7  --  3.3  --   --   --   --    < > = values in this interval not displayed.   Liver Function Tests: Recent Labs  Lab 12/16/17 0353 12/18/17 0530 12/19/17 0501 12/20/17 0502  AST 20 17 16 16   ALT 46 32 27 26  ALKPHOS 146* 128* 118 103  BILITOT 0.4 0.4 0.5 0.4  PROT 5.5* 6.4* 5.9* 5.5*  ALBUMIN 2.1* 2.9* 2.8* 2.6*   No results for input(s): LIPASE, AMYLASE in the last 168 hours. Recent Labs  Lab 12/18/17 0530  AMMONIA 20   CBC: Recent Labs  Lab 12/16/17 0224 12/16/17 0353 12/16/17 1450 12/16/17 2110 12/17/17 0537 12/18/17 0530 12/19/17 0501 12/20/17 0502  WBC 33.3* 31.2* 29.8* 25.3* 23.0* 21.6* 21.6* 14.8*  NEUTROABS 27.6* 25.6* 23.2* 19.1* 17.3*  --   --   --   HGB 7.9* 7.8* 7.5* 7.4* 7.6* 8.9* 8.7* 8.4*  HCT 28.3* 27.9* 27.3* 26.9* 27.4* 31.3* 29.9* 30.5*  MCV 86.0 86.1 86.4 87.1 87.0 85.1 84.2 88.9  PLT 728* 721* 716* 760* 789* 1,028* 991* 889*   Cardiac Enzymes: No results for input(s): CKTOTAL, CKMB, CKMBINDEX, TROPONINI in the last  168 hours. BNP: Invalid input(s): POCBNP CBG: Recent Labs  Lab 12/14/17 1233 12/14/17 1929 12/15/17 1513 12/16/17 2343 12/19/17 0736  GLUCAP 103* 108* 122* 94 104*   D-Dimer No results for input(s): DDIMER in the last 72 hours. Hgb A1c No results for input(s): HGBA1C in the last 72 hours. Lipid Profile No results for input(s): CHOL, HDL, LDLCALC, TRIG, CHOLHDL, LDLDIRECT in the last 72 hours. Thyroid function studies Recent Labs    12/18/17 0530 12/19/17 0426  TSH 7.189*  --   T3FREE  --  2.9   Anemia work up Recent Labs    12/18/17 0530  VITAMINB12 1,081*  FOLATE 21.1  FERRITIN 29  TIBC 347  IRON 21*  RETICCTPCT 3.7*   Urinalysis    Component Value Date/Time   COLORURINE YELLOW 12/12/2017 1103   APPEARANCEUR CLEAR 12/12/2017 1103   LABSPEC 1.015 12/12/2017 1103   PHURINE 8.0 12/12/2017 1103   GLUCOSEU NEGATIVE 12/12/2017 1103   HGBUR SMALL (A) 12/12/2017 1103   BILIRUBINUR NEGATIVE 12/12/2017 1103   KETONESUR NEGATIVE 12/12/2017 1103   PROTEINUR 100 (A) 12/12/2017 1103   UROBILINOGEN 0.2 10/22/2010 0038   NITRITE  NEGATIVE 12/12/2017 1103   LEUKOCYTESUR NEGATIVE 12/12/2017 1103   Sepsis Labs Invalid input(s): PROCALCITONIN,  WBC,  LACTICIDVEN   Time coordinating discharge: 45 minutes  SIGNED:  Marzetta Board, MD  [facility] 12/20/2017, 2:50 PM Pager [phone]  If 7PM-7AM, please contact night-coverage [email or web address] Password [email or web address]

## 2017-12-20 NOTE — Care Management Note (Signed)
Case Management Note  Patient Details  Name: [name] MRN: [id number] Date of Birth: Dec 29, 1980  Subjective/Objective:                    Action/Plan:  PCP DR Odette Fraction   Confirmed face sheet information Expected Discharge Date:  12/20/17               Expected Discharge Plan:  [place]  In-House Referral:  Clinical Social Work  Discharge planning Services  CM Consult  Post Acute Care Choice:  Home Health Choice offered to:  Patient  DME Arranged:  N/A DME Agency:  NA  HH Arranged:  PT [place] Agency:  [facility]  Status of Service:  Completed, signed off  If discussed at [place] of Stay Meetings, dates discussed:    Additional Comments:  Marilu Favre, RN 12/20/2017, 10:00 AM

## 2017-12-20 NOTE — Progress Notes (Signed)
I received call from Dr. Cruzita Lederer that patient to be discharged home today. We will therefore sign off. [phone]

## 2017-12-20 NOTE — Progress Notes (Signed)
Occupational Therapy Treatment Patient Details Name: [name] MRN: [id number] DOB: Aug 27, 1980 Today's Date: 12/20/2017    History of present illness Pt is a 37 y.o. female admitted 12/06/17 with cough, chest pain, hearing loss, N/V, and face/hands tingling. Worked up for bronchitis vs. undiagnosed COPD and sepsis. Pt developed ARDS either from aspiration pneumonitis or rhinovirus; intubated 5/3 and self-extubated 5/8. Potential upper GI bleed noted 5/10. PMH includes Crohn's disease, HTN, otitis media, anxiety, migraines, GERD, fibromyalgia, endometriosis, tobacco abuse, IV drug use.    OT comments  Pt making progress with functional goals and is scheduled to d/c home this afternoon with family support  Follow Up Recommendations  Home health OT;Supervision - Intermittent    Equipment Recommendations  None recommended by OT    Recommendations for Other Services      Precautions / Restrictions Precautions Precautions: Fall Restrictions Weight Bearing Restrictions: No       Mobility Bed Mobility               General bed mobility comments: pt sitting EOB upon arrival  Transfers Overall transfer level: Needs assistance Equipment used: 1 person hand held assist Transfers: Sit to/from Stand Sit to Stand: Min guard              Balance Overall balance assessment: Needs assistance Sitting-balance support: Bilateral upper extremity supported;Feet unsupported Sitting balance-Leahy Scale: Good Sitting balance - Comments: Able to sit at EOB    Standing balance support: No upper extremity supported;During functional activity Standing balance-Leahy Scale: Fair                             ADL either performed or assessed with clinical judgement   ADL Overall ADL's : Needs assistance/impaired     Grooming: Set up;Supervision/safety;Standing;Wash/dry face;Wash/dry hands               Lower Body Dressing: Minimal assistance;Sit to/from stand    Toilet Transfer: Regular Toilet;Supervision/safety;Grab bars;Ambulation;Min guard   Toileting- Clothing Manipulation and Hygiene: Sit to/from stand;Supervision/safety       Functional mobility during ADLs: Min guard;Cueing for safety General ADL Comments: min verbal cues for problem solving and safety during ADLs     Vision Patient Visual Report: No change from baseline     Perception     Praxis      Cognition Arousal/Alertness: Awake/alert Behavior During Therapy: WFL for tasks assessed/performed Overall Cognitive Status: Impaired/Different from baseline Area of Impairment: Safety/judgement;Awareness;Problem solving                       Following Commands: Follows one step commands consistently;Follows multi-step commands with increased time Safety/Judgement: Decreased awareness of safety;Decreased awareness of deficits   Problem Solving: Slow processing;Decreased initiation;Difficulty sequencing;Requires verbal cues          Exercises     Shoulder Instructions       General Comments      Pertinent Vitals/ Pain       Pain Assessment: 0-10 Pain Score: 3  Pain Location: abdomen Pain Descriptors / Indicators: Sore Pain Intervention(s): Monitored during session;Repositioned  Home Living                                          Prior Functioning/Environment  Frequency  Min 2X/week        Progress Toward Goals  OT Goals(current goals can now be found in the care plan section)  Progress towards OT goals: Progressing toward goals     Plan Frequency remains appropriate    Co-evaluation                 AM-PAC PT "6 Clicks" Daily Activity     Outcome Measure   Help from another person eating meals?: Total Help from another person taking care of personal grooming?: A Little Help from another person toileting, which includes using toliet, bedpan, or urinal?: A Little Help from another person bathing  (including washing, rinsing, drying)?: A Little Help from another person to put on and taking off regular upper body clothing?: A Little Help from another person to put on and taking off regular lower body clothing?: A Little 6 Click Score: 16    End of Session Equipment Utilized During Treatment: Gait belt  OT Visit Diagnosis: Other abnormalities of gait and mobility (R26.89);Other symptoms and signs involving cognitive function   Activity Tolerance Patient tolerated treatment well   Patient Left with call bell/phone within reach;in bed(sitting EOB)   Nurse Communication      Functional Assessment Tool Used: AM-PAC 6 Clicks Daily Activity   Time: 1505-6979 OT Time Calculation (min): 33 min  Charges: OT G-codes **NOT FOR INPATIENT CLASS** Functional Assessment Tool Used: AM-PAC 6 Clicks Daily Activity OT General Charges $OT Visit: 1 Visit OT Treatments $Self Care/Home Management : 8-22 mins $Therapeutic Activity: 8-22 mins     Britt Bottom 12/20/2017, 2:19 PM

## 2017-12-22 ENCOUNTER — Emergency Department (HOSPITAL_COMMUNITY)
Admission: EM | Admit: 2017-12-22 | Discharge: 2017-12-22 | Disposition: A | Discharge status: Home / Self Care | Payer: Medicaid Other | Attending: Emergency Medicine | Admitting: Emergency Medicine

## 2017-12-22 ENCOUNTER — Other Ambulatory Visit: Payer: Self-pay

## 2017-12-22 ENCOUNTER — Encounter (HOSPITAL_COMMUNITY): Payer: Self-pay | Admitting: *Deleted

## 2017-12-22 DIAGNOSIS — Z5321 Procedure and treatment not carried out due to patient leaving prior to being seen by health care provider: Secondary | ICD-10-CM | POA: Diagnosis not present

## 2017-12-22 DIAGNOSIS — R109 Unspecified abdominal pain: Secondary | ICD-10-CM | POA: Insufficient documentation

## 2017-12-22 LAB — BASIC METABOLIC PANEL
Anion gap: 15 (ref 5–15)
BUN: 8 mg/dL (ref 6–20)
CO2: 22 mmol/L (ref 22–32)
Calcium: 9.5 mg/dL (ref 8.9–10.3)
Chloride: 103 mmol/L (ref 101–111)
Creatinine, Ser: 1.46 mg/dL — ABNORMAL HIGH (ref 0.44–1.00)
GFR calc Af Amer: 52 mL/min — ABNORMAL LOW (ref >60)
GFR calc non Af Amer: 45 mL/min — ABNORMAL LOW (ref >60)
Glucose, Bld: 135 mg/dL — ABNORMAL HIGH (ref 65–99)
Potassium: 3.1 mmol/L — ABNORMAL LOW (ref 3.5–5.1)
Sodium: 140 mmol/L (ref 135–145)

## 2017-12-22 LAB — CBC
HCT: 33.8 % — ABNORMAL LOW (ref 36.0–46.0)
Hemoglobin: 9.9 g/dL — ABNORMAL LOW (ref 12.0–15.0)
MCH: 24.1 pg — ABNORMAL LOW (ref 26.0–34.0)
MCHC: 29.3 g/dL — ABNORMAL LOW (ref 30.0–36.0)
MCV: 82.4 fL (ref 78.0–100.0)
Platelets: 1478 10*3/uL (ref 150–400)
RBC: 4.1 MIL/uL (ref 3.87–5.11)
RDW: 20 % — ABNORMAL HIGH (ref 11.5–15.5)
WBC: 23.7 10*3/uL — ABNORMAL HIGH (ref 4.0–10.5)

## 2017-12-22 NOTE — ED Triage Notes (Signed)
Ems gave her 4mg  zofran and fentanel 139mcg iv

## 2017-12-22 NOTE — ED Notes (Signed)
cbg 132 by gems

## 2017-12-22 NOTE — ED Triage Notes (Signed)
The pt is c/o abd pain with nausea and vomiting  She arrived by gems from home iv per ems   She was seen here yesterday for the same    Nausea vomiting and diarrhea  lmp unknown

## 2017-12-22 NOTE — ED Notes (Signed)
Patient states she is going to leave, pt encouraged to stay and be seen by provider, pt advised she is not going to stay, IV removed by EDT [facility].

## 2017-12-23 ENCOUNTER — Ambulatory Visit: Admit: 2017-12-23 | Discharge: 2017-12-27 | Disposition: A | Discharge status: Home / Self Care | Payer: MEDICAID

## 2017-12-23 ENCOUNTER — Encounter
Admit: 2017-12-23 | Discharge: 2017-12-27 | Disposition: A | Discharge status: Home / Self Care | Payer: MEDICAID | Attending: Nurse Practitioner

## 2017-12-23 DIAGNOSIS — D72829 Elevated white blood cell count, unspecified: Principal | ICD-10-CM

## 2017-12-24 DIAGNOSIS — D72829 Elevated white blood cell count, unspecified: Principal | ICD-10-CM

## 2017-12-25 DIAGNOSIS — D72829 Elevated white blood cell count, unspecified: Principal | ICD-10-CM

## 2017-12-26 DIAGNOSIS — D72829 Elevated white blood cell count, unspecified: Principal | ICD-10-CM

## 2017-12-26 DIAGNOSIS — F329 Major depressive disorder, single episode, unspecified: Secondary | ICD-10-CM | POA: Insufficient documentation

## 2017-12-27 DIAGNOSIS — K2210 Ulcer of esophagus without bleeding: Secondary | ICD-10-CM | POA: Insufficient documentation

## 2017-12-27 MED ORDER — PANTOPRAZOLE 2 MG/ML ORAL SUSPENSION
Freq: Two times a day (BID) | ORAL | 2 refills | 0.00000 days | Status: CP
Start: 2017-12-27 — End: 2018-01-19

## 2017-12-27 MED ORDER — ALUMINUM-MAG HYDROXIDE-SIMETHICONE 200 MG-200 MG-20 MG/5 ML ORAL SUSP
0 refills | 0 days
Start: 2017-12-27 — End: 2018-08-30

## 2017-12-27 MED ORDER — OXYCODONE 10 MG TABLET
ORAL_TABLET | ORAL | 0 refills | 0.00000 days | Status: CP | PRN
Start: 2017-12-27 — End: 2018-01-19

## 2017-12-27 MED ORDER — LIDOCAINE HCL 2 % MUCOSAL SOLUTION
OROMUCOSAL | 0 refills | 0.00000 days | Status: CP | PRN
Start: 2017-12-27 — End: 2018-01-19

## 2017-12-27 MED ORDER — LORAZEPAM 0.5 MG TABLET: tablet | 0 refills | 0 days

## 2017-12-27 MED ORDER — LORAZEPAM 0.5 MG TABLET
ORAL_TABLET | Freq: Two times a day (BID) | ORAL | 0 refills | 0.00000 days | Status: CP | PRN
Start: 2017-12-27 — End: 2017-12-27

## 2017-12-27 MED ORDER — PANTOPRAZOLE 40 MG TABLET,DELAYED RELEASE
ORAL_TABLET | ORAL | 2 refills | 0 days | Status: CP
Start: 2017-12-27 — End: 2018-08-14

## 2017-12-27 MED ORDER — DULOXETINE 20 MG CAPSULE,DELAYED RELEASE
ORAL_CAPSULE | ORAL | 0 refills | 0 days
Start: 2017-12-27 — End: 2017-12-27

## 2017-12-27 MED ORDER — ALUMINUM-MAG HYDROXIDE-SIMETHICONE 400 MG-400 MG-40 MG/5 ML ORAL SUSP
Freq: Four times a day (QID) | ORAL | 0 refills | 0.00000 days | Status: CP | PRN
Start: 2017-12-27 — End: 2018-01-19

## 2017-12-27 MED ORDER — BUDESONIDE DR - ER 3 MG CAPSULE,DELAYED,EXTENDED RELEASE
ORAL_CAPSULE | ORAL | 11 refills | 0 days
Start: 2017-12-27 — End: 2017-12-27

## 2017-12-27 MED ORDER — SUCRALFATE 100 MG/ML ORAL SUSPENSION
2 refills | 0 days
Start: 2017-12-27 — End: 2017-12-27

## 2017-12-27 MED FILL — DULOXETINE HCL/20MG/CPEP: DULOXETINE HCL/20MG/CPEP | 30 days supply | Qty: 30 | Fill #0

## 2017-12-27 MED FILL — CARAFATE/1GM/10ML/SUSP: CARAFATE/1GM/10ML/SUSP | 10 days supply | Qty: 420 | Fill #0

## 2017-12-27 MED FILL — BUDESONIDE/3MG DR/CPEP: BUDESONIDE/3MG DR/CPEP | 30 days supply | Qty: 90 | Fill #0

## 2017-12-27 MED FILL — PANTOPRAZOLE/40MG/TBEC: PANTOPRAZOLE/40MG/TBEC | 30 days supply | Qty: 60 | Fill #0

## 2017-12-27 MED FILL — LIDOCAINE HCL VISCOUS/2% VISC/SOL: LIDOCAINE HCL VISCOUS/2% VISC/SOL | 2 days supply | Qty: 100 | Fill #0

## 2017-12-27 MED FILL — LORAZEPAM/0.5MG/TABS: LORAZEPAM/0.5MG/TABS | 4 days supply | Qty: 8 | Fill #0

## 2017-12-27 MED FILL — OXYCODONE HCL/10MG/TABS: OXYCODONE HCL/10MG/TABS | 2 days supply | Qty: 10 | Fill #0

## 2017-12-28 MED ORDER — SUCRALFATE 100 MG/ML ORAL SUSPENSION
Freq: Four times a day (QID) | ORAL | 2 refills | 0.00000 days | Status: CP
Start: 2017-12-28 — End: 2018-01-19

## 2017-12-28 MED ORDER — BUDESONIDE DR - ER 3 MG CAPSULE,DELAYED,EXTENDED RELEASE
ORAL_CAPSULE | Freq: Every day | ORAL | 11 refills | 0 days | Status: CP
Start: 2017-12-28 — End: 2018-01-19

## 2017-12-28 MED ORDER — DULOXETINE 20 MG CAPSULE,DELAYED RELEASE
ORAL_CAPSULE | Freq: Every day | ORAL | 0 refills | 0.00000 days | Status: CP
Start: 2017-12-28 — End: 2018-02-27

## 2017-12-30 ENCOUNTER — Other Ambulatory Visit: Payer: Self-pay

## 2017-12-30 ENCOUNTER — Telehealth (HOSPITAL_COMMUNITY): Payer: Self-pay

## 2017-12-30 ENCOUNTER — Ambulatory Visit (HOSPITAL_COMMUNITY)
Admission: EM | Admit: 2017-12-30 | Discharge: 2017-12-30 | Disposition: A | Discharge status: Home / Self Care | Payer: Medicaid Other | Attending: Emergency Medicine | Admitting: Emergency Medicine

## 2017-12-30 ENCOUNTER — Encounter (HOSPITAL_COMMUNITY): Payer: Self-pay

## 2017-12-30 DIAGNOSIS — I10 Essential (primary) hypertension: Secondary | ICD-10-CM | POA: Diagnosis not present

## 2017-12-30 DIAGNOSIS — F909 Attention-deficit hyperactivity disorder, unspecified type: Secondary | ICD-10-CM | POA: Insufficient documentation

## 2017-12-30 DIAGNOSIS — F411 Generalized anxiety disorder: Secondary | ICD-10-CM | POA: Diagnosis not present

## 2017-12-30 DIAGNOSIS — L719 Rosacea, unspecified: Secondary | ICD-10-CM | POA: Insufficient documentation

## 2017-12-30 DIAGNOSIS — E538 Deficiency of other specified B group vitamins: Secondary | ICD-10-CM | POA: Diagnosis not present

## 2017-12-30 DIAGNOSIS — Z79899 Other long term (current) drug therapy: Secondary | ICD-10-CM | POA: Insufficient documentation

## 2017-12-30 DIAGNOSIS — M797 Fibromyalgia: Secondary | ICD-10-CM | POA: Insufficient documentation

## 2017-12-30 DIAGNOSIS — F17210 Nicotine dependence, cigarettes, uncomplicated: Secondary | ICD-10-CM | POA: Diagnosis not present

## 2017-12-30 DIAGNOSIS — K5080 Crohn's disease of both small and large intestine without complications: Secondary | ICD-10-CM | POA: Diagnosis not present

## 2017-12-30 DIAGNOSIS — M5416 Radiculopathy, lumbar region: Secondary | ICD-10-CM | POA: Insufficient documentation

## 2017-12-30 DIAGNOSIS — M1990 Unspecified osteoarthritis, unspecified site: Secondary | ICD-10-CM | POA: Diagnosis not present

## 2017-12-30 DIAGNOSIS — J45909 Unspecified asthma, uncomplicated: Secondary | ICD-10-CM | POA: Insufficient documentation

## 2017-12-30 DIAGNOSIS — M25512 Pain in left shoulder: Secondary | ICD-10-CM | POA: Diagnosis not present

## 2017-12-30 DIAGNOSIS — R600 Localized edema: Secondary | ICD-10-CM | POA: Diagnosis not present

## 2017-12-30 DIAGNOSIS — J80 Acute respiratory distress syndrome: Secondary | ICD-10-CM | POA: Insufficient documentation

## 2017-12-30 DIAGNOSIS — M25511 Pain in right shoulder: Secondary | ICD-10-CM | POA: Insufficient documentation

## 2017-12-30 DIAGNOSIS — M7989 Other specified soft tissue disorders: Secondary | ICD-10-CM | POA: Diagnosis present

## 2017-12-30 DIAGNOSIS — E876 Hypokalemia: Secondary | ICD-10-CM | POA: Diagnosis not present

## 2017-12-30 DIAGNOSIS — R070 Pain in throat: Secondary | ICD-10-CM | POA: Diagnosis not present

## 2017-12-30 DIAGNOSIS — G8929 Other chronic pain: Secondary | ICD-10-CM | POA: Insufficient documentation

## 2017-12-30 DIAGNOSIS — F1010 Alcohol abuse, uncomplicated: Secondary | ICD-10-CM | POA: Diagnosis not present

## 2017-12-30 DIAGNOSIS — K219 Gastro-esophageal reflux disease without esophagitis: Secondary | ICD-10-CM | POA: Insufficient documentation

## 2017-12-30 LAB — CBC WITH DIFFERENTIAL/PLATELET
Basophils Absolute: 0.1 10*3/uL (ref 0.0–0.1)
Basophils Relative: 1 %
Eosinophils Absolute: 0.2 10*3/uL (ref 0.0–0.7)
Eosinophils Relative: 2 %
HCT: 29.9 % — ABNORMAL LOW (ref 36.0–46.0)
Hemoglobin: 8.4 g/dL — ABNORMAL LOW (ref 12.0–15.0)
Lymphocytes Relative: 35 %
Lymphs Abs: 4.1 10*3/uL — ABNORMAL HIGH (ref 0.7–4.0)
MCH: 23.7 pg — ABNORMAL LOW (ref 26.0–34.0)
MCHC: 28.1 g/dL — ABNORMAL LOW (ref 30.0–36.0)
MCV: 84.2 fL (ref 78.0–100.0)
Monocytes Absolute: 0.6 10*3/uL (ref 0.1–1.0)
Monocytes Relative: 5 %
Neutro Abs: 6.6 10*3/uL (ref 1.7–7.7)
Neutrophils Relative %: 57 %
Platelets: 686 10*3/uL — ABNORMAL HIGH (ref 150–400)
RBC: 3.55 MIL/uL — ABNORMAL LOW (ref 3.87–5.11)
RDW: 19.3 % — ABNORMAL HIGH (ref 11.5–15.5)
WBC: 11.6 10*3/uL — ABNORMAL HIGH (ref 4.0–10.5)

## 2017-12-30 MED ORDER — LIDOCAINE VISCOUS HCL 2 % MT SOLN: OROMUCOSAL | 0 refills | Status: DC

## 2017-12-30 MED ORDER — OXYCODONE-ACETAMINOPHEN 5-325 MG PO TABS: 1.0000 | ORAL_TABLET | Freq: Four times a day (QID) | ORAL | 0 refills | Status: DC | PRN

## 2017-12-30 NOTE — Telephone Encounter (Signed)
results given to ordering provider, attempted to reach patient. No answer at this time. Voicemail left.  Per Tye Maryland NP the patient needs to continue with original discharge instructions.

## 2017-12-30 NOTE — ED Provider Notes (Signed)
[place]    CSN: [id number] Arrival date & time: 12/30/17  1249     History   Chief Complaint Chief Complaint  Patient presents with  . Leg Swelling    HPI [name] is a 37 y.o. female.   Recently hospitalized for approximately 15 days with Crohn's disease.  Patient was seen by [facility].  Patient called them today to let them know that her pain was out of control and she was advised to come to urgent care for evaluation.  HPI  Past Medical History:  Diagnosis Date  . Abdominal pain, unspecified site 03/24/2009  . 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 ADHD 12/02/2009  . Allergic rhinitis, cause unspecified 01/21/2011  . ANXIETY 03/24/2009  . B12 DEFICIENCY 04/28/2009  . Bronchitis 12/2017  . BURSITIS, RIGHT KNEE 02/05/2010  . Cellulitis and abscess of leg, except foot 02/05/2010  . Cervicalgia 12/02/2009  . COMMON MIGRAINE 02/05/2010  . CROHN'S [facility] INTESTINE 05/19/2009  . ECZEMA 05/20/2010  . Endometriosis 08/04/2011  . Fibromyalgia   . GERD 03/24/2009  . HEADACHE, CHRONIC 03/24/2009  . HYPERTENSION 03/24/2009  . Osteoarthritis   . OTITIS MEDIA, LEFT 08/12/2010  . SMOKER 12/02/2009  . Spine pain 01/21/2011   neck and thoracic spine  . VITAMIN B1 DEFICIENCY 09/21/2009  . Wheezing 08/12/2010    Patient Active Problem List   Diagnosis Date Noted  . ARDS (adult respiratory distress syndrome) ([place])   . Leukocytosis   . Tobacco abuse   . Alcohol abuse   . Polysubstance abuse ([place])   . Attention deficit hyperactivity disorder (ADHD)   . Vitamin B12 deficiency   . Fibromyalgia   . Crohn's disease with complication ([place])   . Uncomplicated asthma   . Tachycardia   . Tachypnea   . Acute blood loss anemia   . Thrombocytosis ([place])   . 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 Acute respiratory failure with hypoxemia ([place])   . Acute respiratory failure with hypoxia ([place]) 12/08/2017  . Acute sinusitis 12/07/2017  . Acute kidney injury ([place])   . Hypocalcemia  12/06/2017  . Hypomagnesemia 12/06/2017  . Bronchitis 12/06/2017  . Sepsis ([place]) 12/06/2017  . AKI (acute kidney injury) ([place]) 12/06/2017  . 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 Abdominal pain 10/09/2013  . Rectal prolapse 10/09/2013  . Blurred vision, bilateral 03/28/2012  . Thoracic spine pain 03/28/2012  . Chronic narcotic dependence ([place]) 11/02/2011  . Abdominal pain, generalized 11/02/2011  . Hypokalemia 11/02/2011  . Radiculopathy 11/02/2011  . Bilateral shoulder pain 10/01/2011  . Cervical radiculopathy 09/30/2011  . Pain, joint, multiple sites 09/30/2011  . Lumbar radicular pain 08/04/2011  . Chronic neck pain 08/04/2011  . Endometriosis 08/04/2011  . Allergic rhinitis, cause unspecified 01/21/2011  . Chronic back pain 01/21/2011  . B12 deficiency 12/13/2010  . Preventative health care 12/06/2010  . ECZEMA 05/20/2010  . COMMON MIGRAINE 02/05/2010  . ADHD 12/02/2009  . 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 CROHN'S [facility] INTESTINE 05/19/2009  . Anxiety state 03/24/2009  . Essential hypertension 03/24/2009  . GERD 03/24/2009  . HEADACHE, CHRONIC 03/24/2009    Past Surgical History:  Procedure Laterality Date  . BREAST ENHANCEMENT SURGERY  2004  . ENDOMETRIAL ABLATION  01/2009   Thinks laproscopic with possible transvaginal  . RECTAL PROLAPSE REPAIR      OB History   None      Home Medications    Prior to Admission medications  Medication Sig Start Date End Date Taking? Authorizing Provider  budesonide (ENTOCORT EC) 3 MG 24 hr capsule Take 3 capsules (9 mg total) by mouth daily. 10/30/13  Yes Kelvin Cellar, MD  DULoxetine (CYMBALTA) 20 MG capsule Take 20 mg by mouth daily.   Yes [provider]  esomeprazole (NEXIUM) 40 MG capsule Take 1 capsule (40 mg total) by mouth 2 (two) times daily before a meal. 12/20/17  Yes Gherghe, Vella Redhead, MD  metoprolol succinate (TOPROL-XL) 50 MG 24 hr tablet Take 1 tablet (50  mg total) by mouth daily. Take with or immediately following a meal. 12/20/17  Yes Gherghe, Vella Redhead, MD  Multiple Vitamin (MULTIVITAMIN WITH MINERALS) TABS tablet Take 1 tablet by mouth daily. 12/21/17  Yes Gherghe, Vella Redhead, MD  ALPRAZolam Duanne Moron) 0.5 MG tablet Take 1 tablet (0.5 mg total) by mouth 3 (three) times daily as needed for anxiety. 12/20/17   Caren Griffins, MD  lidocaine (XYLOCAINE) 2 % solution 15 mls every four hours for esophogeal pain. 12/30/17   Nehemiah Settle, NP  magnesium oxide (MAG-OX) 400 MG tablet Take 1 tablet (400 mg total) by mouth 2 (two) times daily. 12/20/17   Caren Griffins, MD  naproxen sodium (ALEVE) 220 MG tablet Take 220-440 mg by mouth 2 (two) times daily as needed (for pain or discomfort).    [provider]  oxyCODONE-acetaminophen (PERCOCET/ROXICET) 5-325 MG tablet Take 1 tablet by mouth every 6 (six) hours as needed for severe pain. 12/30/17   Nehemiah Settle, NP  potassium chloride SA (K-DUR,KLOR-CON) 20 MEQ tablet Take 1 tablet (20 mEq total) by mouth daily. 12/20/17   Caren Griffins, MD  amLODipine (NORVASC) 10 MG tablet Take 1 tablet (10 mg total) by mouth daily. 09/30/11 11/01/11  Biagio Borg, MD  pregabalin (LYRICA) 50 MG capsule Take 1 capsule (50 mg total) by mouth 3 (three) times daily. 09/30/11 11/01/11  Biagio Borg, MD    Family History Family History  Problem Relation Age of Onset  . Cancer Mother        breast  . Clotting disorder Mother   . Cancer Maternal Grandmother        Stomach Cancer  . Cancer Maternal Grandfather        Esophageal Cancer    Social History Social History   Tobacco Use  . Smoking status: Current Every Day Smoker    Packs/day: 0.50    Years: 14.00    Pack years: 7.00    Types: Cigarettes  . Smokeless tobacco: Never Used  . Tobacco comment: 5 daily  Substance Use Topics  . Alcohol use: Yes    Comment: occasionally  . Drug use: Not Currently    Types: Other-see comments    Comment: oral  narcotics, family says she does not use IV drugs     Allergies   Penicillins and Doxycycline   Review of Systems Review of Systems  Constitutional: Negative.  Negative for fatigue and fever.  HENT: Positive for sore throat.   Eyes: Negative.  Negative for visual disturbance.  Respiratory: Negative.  Negative for cough and shortness of breath.   Cardiovascular: Positive for chest pain and leg swelling.  Gastrointestinal: Positive for abdominal distention.  Endocrine: Negative.   Genitourinary: Negative.   Musculoskeletal: Negative.  Negative for gait problem and neck stiffness.  Skin: Positive for rash.  Allergic/Immunologic: Negative.   Neurological: Negative.  Negative for dizziness, weakness and headaches.  Hematological: Negative.   Psychiatric/Behavioral:  Negative.      Physical Exam Triage Vital Signs ED Triage Vitals  Enc Vitals Group     BP 12/30/17 1429 (!) 151/102     Pulse Rate 12/30/17 1429 67     Resp 12/30/17 1429 16     Temp 12/30/17 1429 98.2 F (36.8 C)     Temp Source 12/30/17 1429 Oral     SpO2 12/30/17 1429 100 %     Weight --      Height --      Head Circumference --      Peak Flow --      Pain Score 12/30/17 1430 10     Pain Loc --      Pain Edu? --      Excl. in [place]? --    No data found.  Updated Vital Signs BP (!) 151/102 (BP Location: Left Arm)   Pulse 67   Temp 98.2 F (36.8 C) (Oral)   Resp 16   LMP 12/03/2017 (Exact Date)   SpO2 100%    Physical Exam  Constitutional: She is oriented to person, place, and time. She appears well-developed. No distress.  Eyes: Right eye exhibits no discharge. Left eye exhibits no discharge. No scleral icterus.  Neck: Normal range of motion. Neck supple. No thyromegaly present.  Cardiovascular: Normal rate, regular rhythm, normal heart sounds and intact distal pulses. Exam reveals no gallop and no friction rub.  No murmur heard. Bilateral lower extremity and pedal edema present bilaterally.  Skin  is shiny and tight with some pitting noted.  Pulmonary/Chest: Effort normal and breath sounds normal. No stridor. No respiratory distress. She has no wheezes. She has no rales. She exhibits no tenderness.  Denies shortness of breath, nausea, or vomiting.  Denies chest pain or shortness of breath with exertion.  Denies diaphoresis or palpitations.  Abdominal: Soft. She exhibits distension. There is tenderness.  Musculoskeletal: She exhibits edema.  Neurological: She is alert and oriented to person, place, and time.  Skin: Skin is warm and dry. Capillary refill takes less than 2 seconds. Rash noted. She is not diaphoretic.  Rash across lower back.  Patient notes that it "itches a little bit".  Redness consistent with prolonged hospital stay.  Nursing note and vitals reviewed.    UC Treatments / Results  Labs (all labs ordered are listed, but only abnormal results are displayed) Labs Reviewed  CBC WITH DIFFERENTIAL/PLATELET - Abnormal; Notable for the following components:      Result Value   WBC 11.6 (*)    RBC 3.55 (*)    Hemoglobin 8.4 (*)    HCT 29.9 (*)    MCH 23.7 (*)    MCHC 28.1 (*)    RDW 19.3 (*)    Platelets 686 (*)    Lymphs Abs 4.1 (*)    All other components within normal limits    EKG None  Radiology No results found.  Procedures Procedures (including critical care time)  Medications Ordered in UC Medications - No data to display  Initial Impression / Assessment and Plan / UC Course  I have reviewed the triage vital signs and the nursing notes.  Pertinent labs & imaging results that were available during my care of the patient were reviewed by me and considered in my medical decision making (see chart for details).     Plan of care was discussed with Dr. Verline Lema.  Patient was advised that she should go to the emergency department for further work-up  and evaluation.  She became very tearful and states that she was not going to go to the ED.  Patient has  her child with her states she is "not going to do that to him".  Discussed with patient the potential long-term ramifications of not following up immediately.  Advised the patient that it was her decision and that should she have any worsening of severity she should call 911 or go to the ED immediately.  Agreed to renew patient's viscous lidocaine and pain medication, so she is able to follow-up with [facility] for further evaluation.  She is afebrile and agreed to allow CBC to be drawn and we can call her with abnormal results.  Discussed possible protein deficiency causing pedal edema and that she needed to be taking protein shakes or drinking her boost as much as possible.   Final Clinical Impressions(s) / UC Diagnoses   Final diagnoses:  Leg edema  Pain in throat     Discharge Instructions     Go to the Emergency Department if symptoms worsen.  Follow up first thing Tuesday am with your [facility] doc.  We will call you if your CBC results are abnormal.     ED Prescriptions    Medication Sig Dispense Auth. Provider   lidocaine (XYLOCAINE) 2 % solution 15 mls every four hours for esophogeal pain. 100 mL Jacqualyn Posey H, NP   oxyCODONE-acetaminophen (PERCOCET/ROXICET) 5-325 MG tablet Take 1 tablet by mouth every 6 (six) hours as needed for severe pain. 10 tablet Nehemiah Settle, NP     Controlled Substance Prescriptions [place] Controlled Substance Registry consulted? Yes, I have consulted the [place] Controlled Substances Registry for this patient, and feel the risk/benefit ratio today is favorable for proceeding with this prescription for a controlled substance.  The usual and customary discharge instructions and warnings were given.  The patient verbalizes understanding and agrees to plan of care.      Nehemiah Settle, NP 12/30/17 1810

## 2017-12-30 NOTE — Discharge Instructions (Addendum)
Go to the Emergency Department if symptoms worsen.  Follow up first thing Tuesday am with your [facility] doc.  We will call you if your CBC results are abnormal.

## 2017-12-30 NOTE — ED Triage Notes (Signed)
Pt presents today with swelling in feet and ankles and pain. States she has chron's disease as well and she thinks she is having a flare up. Was dx with esophagitis and having burning in throat and chest. Was put on medicine to help but she is still having pain. Just recently got out of hospital on 12/27/17

## 2018-01-01 ENCOUNTER — Telehealth (HOSPITAL_COMMUNITY): Payer: Self-pay

## 2018-01-01 NOTE — Telephone Encounter (Signed)
Pt returned call and is aware of results. Given Tye Maryland NP instructions and encouraged close follow up with PCP

## 2018-01-19 ENCOUNTER — Ambulatory Visit: Admit: 2018-01-19 | Discharge: 2018-01-20 | Payer: MEDICAID | Attending: Internal Medicine | Primary: Internal Medicine

## 2018-01-19 DIAGNOSIS — M2550 Pain in unspecified joint: Secondary | ICD-10-CM

## 2018-01-19 DIAGNOSIS — K209 Esophagitis, unspecified: Secondary | ICD-10-CM

## 2018-01-19 DIAGNOSIS — D509 Iron deficiency anemia, unspecified: Secondary | ICD-10-CM

## 2018-01-19 DIAGNOSIS — Z720 Tobacco use: Secondary | ICD-10-CM

## 2018-01-19 DIAGNOSIS — Z23 Encounter for immunization: Secondary | ICD-10-CM

## 2018-01-19 DIAGNOSIS — K5090 Crohn's disease, unspecified, without complications: Principal | ICD-10-CM

## 2018-01-19 MED ORDER — PANTOPRAZOLE 40 MG TABLET,DELAYED RELEASE
ORAL_TABLET | Freq: Two times a day (BID) | ORAL | 1 refills | 0 days | Status: CP
Start: 2018-01-19 — End: 2018-02-27

## 2018-01-19 MED ORDER — BUDESONIDE DR - ER 3 MG CAPSULE,DELAYED,EXTENDED RELEASE
ORAL_CAPSULE | Freq: Every day | ORAL | 1 refills | 0.00000 days | Status: CP
Start: 2018-01-19 — End: 2018-02-27

## 2018-01-22 DIAGNOSIS — D509 Iron deficiency anemia, unspecified: Secondary | ICD-10-CM | POA: Insufficient documentation

## 2018-01-22 MED ORDER — USTEKINUMAB 90 MG/ML SUBCUTANEOUS SYRINGE: 90 mg | mL | 0 refills | 0 days | Status: AC

## 2018-01-22 MED ORDER — USTEKINUMAB 90 MG/ML SUBCUTANEOUS SYRINGE
SUBCUTANEOUS | 0 refills | 0.00000 days | Status: CP
Start: 2018-01-22 — End: 2018-08-30
  Filled 2018-04-05: qty 1, 34d supply, fill #0

## 2018-01-22 MED ORDER — EMPTY CONTAINER
PRN refills | 0 days
Start: 2018-01-22 — End: 2018-08-30

## 2018-01-22 NOTE — Unmapped (Addendum)
Attempted to call pt to discuss care plan/initation of stelara and feraheme and see if pt was able to go to ED with low Mg. Unable to leave VM as pt voicemail box is full. TP initated for Stelara and feraheme and SQ prescription sent to [facility] as pt has [employer] with pt, she has doubled her Mg and K as instructed by Dr. [name] VM on Friday, but did not go to ED. She states she does have muscle cramps all the time. Encouraged pt to go to ED for IV mg since her mg was so significantly low.  She also understands to make appointments at [facility] for Stelara infusion, and 2 doses of Feraheme. Will provide stelara SQ self injection education at app with Dr. [name] on 7/23

## 2018-01-22 NOTE — Unmapped (Signed)
Per test claim for Stelara at the [facility], patient needs Medication Assistance Program for Prior Authorization.

## 2018-01-23 ENCOUNTER — Observation Stay (HOSPITAL_COMMUNITY)
Admission: EM | Admit: 2018-01-23 | Discharge: 2018-01-24 | Disposition: A | Discharge status: Home / Self Care | Payer: Medicaid Other | Attending: Internal Medicine | Admitting: Internal Medicine

## 2018-01-23 ENCOUNTER — Other Ambulatory Visit: Payer: Self-pay

## 2018-01-23 ENCOUNTER — Encounter (HOSPITAL_COMMUNITY): Payer: Self-pay | Admitting: Emergency Medicine

## 2018-01-23 ENCOUNTER — Emergency Department (HOSPITAL_COMMUNITY): Payer: Medicaid Other

## 2018-01-23 DIAGNOSIS — K219 Gastro-esophageal reflux disease without esophagitis: Secondary | ICD-10-CM | POA: Diagnosis not present

## 2018-01-23 DIAGNOSIS — Z880 Allergy status to penicillin: Secondary | ICD-10-CM | POA: Diagnosis not present

## 2018-01-23 DIAGNOSIS — I10 Essential (primary) hypertension: Secondary | ICD-10-CM | POA: Diagnosis not present

## 2018-01-23 DIAGNOSIS — D75839 Thrombocytosis, unspecified: Secondary | ICD-10-CM | POA: Diagnosis present

## 2018-01-23 DIAGNOSIS — D509 Iron deficiency anemia, unspecified: Secondary | ICD-10-CM | POA: Diagnosis present

## 2018-01-23 DIAGNOSIS — M549 Dorsalgia, unspecified: Secondary | ICD-10-CM | POA: Diagnosis not present

## 2018-01-23 DIAGNOSIS — D72829 Elevated white blood cell count, unspecified: Secondary | ICD-10-CM | POA: Diagnosis not present

## 2018-01-23 DIAGNOSIS — R000 Tachycardia, unspecified: Secondary | ICD-10-CM | POA: Diagnosis not present

## 2018-01-23 DIAGNOSIS — D473 Essential (hemorrhagic) thrombocythemia: Secondary | ICD-10-CM | POA: Insufficient documentation

## 2018-01-23 DIAGNOSIS — Z800 Family history of malignant neoplasm of digestive organs: Secondary | ICD-10-CM | POA: Diagnosis not present

## 2018-01-23 DIAGNOSIS — E876 Hypokalemia: Secondary | ICD-10-CM | POA: Diagnosis present

## 2018-01-23 DIAGNOSIS — G43909 Migraine, unspecified, not intractable, without status migrainosus: Secondary | ICD-10-CM | POA: Diagnosis not present

## 2018-01-23 DIAGNOSIS — G8929 Other chronic pain: Secondary | ICD-10-CM | POA: Diagnosis present

## 2018-01-23 DIAGNOSIS — F411 Generalized anxiety disorder: Secondary | ICD-10-CM | POA: Diagnosis present

## 2018-01-23 DIAGNOSIS — Z7952 Long term (current) use of systemic steroids: Secondary | ICD-10-CM | POA: Insufficient documentation

## 2018-01-23 DIAGNOSIS — Z79899 Other long term (current) drug therapy: Secondary | ICD-10-CM | POA: Diagnosis not present

## 2018-01-23 DIAGNOSIS — K5080 Crohn's disease of both small and large intestine without complications: Secondary | ICD-10-CM | POA: Insufficient documentation

## 2018-01-23 DIAGNOSIS — R002 Palpitations: Secondary | ICD-10-CM | POA: Diagnosis not present

## 2018-01-23 DIAGNOSIS — M797 Fibromyalgia: Secondary | ICD-10-CM | POA: Insufficient documentation

## 2018-01-23 DIAGNOSIS — F418 Other specified anxiety disorders: Secondary | ICD-10-CM | POA: Insufficient documentation

## 2018-01-23 DIAGNOSIS — F17210 Nicotine dependence, cigarettes, uncomplicated: Secondary | ICD-10-CM | POA: Diagnosis not present

## 2018-01-23 DIAGNOSIS — N289 Disorder of kidney and ureter, unspecified: Secondary | ICD-10-CM | POA: Diagnosis not present

## 2018-01-23 DIAGNOSIS — F909 Attention-deficit hyperactivity disorder, unspecified type: Secondary | ICD-10-CM | POA: Diagnosis not present

## 2018-01-23 DIAGNOSIS — G43809 Other migraine, not intractable, without status migrainosus: Secondary | ICD-10-CM

## 2018-01-23 DIAGNOSIS — Z803 Family history of malignant neoplasm of breast: Secondary | ICD-10-CM | POA: Insufficient documentation

## 2018-01-23 DIAGNOSIS — Z881 Allergy status to other antibiotic agents status: Secondary | ICD-10-CM | POA: Diagnosis not present

## 2018-01-23 DIAGNOSIS — R42 Dizziness and giddiness: Secondary | ICD-10-CM | POA: Insufficient documentation

## 2018-01-23 DIAGNOSIS — F1911 Other psychoactive substance abuse, in remission: Secondary | ICD-10-CM | POA: Insufficient documentation

## 2018-01-23 DIAGNOSIS — R079 Chest pain, unspecified: Secondary | ICD-10-CM | POA: Insufficient documentation

## 2018-01-23 DIAGNOSIS — R197 Diarrhea, unspecified: Secondary | ICD-10-CM | POA: Diagnosis present

## 2018-01-23 HISTORY — DX: Personal history of other diseases of the digestive system: Z87.19

## 2018-01-23 HISTORY — DX: Anemia, unspecified: D64.9

## 2018-01-23 HISTORY — DX: Pneumonia, unspecified organism: J18.9

## 2018-01-23 LAB — BASIC METABOLIC PANEL
Anion gap: 15 (ref 5–15)
BUN: 21 mg/dL — ABNORMAL HIGH (ref 6–20)
CO2: 16 mmol/L — ABNORMAL LOW (ref 22–32)
Calcium: 8.3 mg/dL — ABNORMAL LOW (ref 8.9–10.3)
Chloride: 110 mmol/L (ref 101–111)
Creatinine, Ser: 1.14 mg/dL — ABNORMAL HIGH (ref 0.44–1.00)
GFR calc Af Amer: >60 mL/min (ref >60)
GFR calc non Af Amer: >60 mL/min (ref >60)
Glucose, Bld: 120 mg/dL — ABNORMAL HIGH (ref 65–99)
Potassium: 2.8 mmol/L — ABNORMAL LOW (ref 3.5–5.1)
Sodium: 141 mmol/L (ref 135–145)

## 2018-01-23 LAB — URINALYSIS, ROUTINE W REFLEX MICROSCOPIC
Bilirubin Urine: NEGATIVE
Glucose, UA: NEGATIVE mg/dL
Ketones, ur: NEGATIVE mg/dL
Leukocytes, UA: NEGATIVE
Nitrite: NEGATIVE
Protein, ur: 100 mg/dL — AB
Specific Gravity, Urine: 1.02 (ref 1.005–1.030)
pH: 5 (ref 5.0–8.0)

## 2018-01-23 LAB — I-STAT BETA HCG BLOOD, ED (MC, WL, AP ONLY): I-stat hCG, quantitative: <5 m[IU]/mL (ref <5)

## 2018-01-23 LAB — CBC
HCT: 36.4 % (ref 36.0–46.0)
Hemoglobin: 10.1 g/dL — ABNORMAL LOW (ref 12.0–15.0)
MCH: 21.5 pg — ABNORMAL LOW (ref 26.0–34.0)
MCHC: 27.7 g/dL — ABNORMAL LOW (ref 30.0–36.0)
MCV: 77.6 fL — ABNORMAL LOW (ref 78.0–100.0)
Platelets: 639 10*3/uL — ABNORMAL HIGH (ref 150–400)
RBC: 4.69 MIL/uL (ref 3.87–5.11)
RDW: 21 % — ABNORMAL HIGH (ref 11.5–15.5)
WBC: 19.2 10*3/uL — ABNORMAL HIGH (ref 4.0–10.5)

## 2018-01-23 LAB — MAGNESIUM: Magnesium: 0.7 mg/dL — CL (ref 1.7–2.4)

## 2018-01-23 LAB — I-STAT TROPONIN, ED: Troponin i, poc: 0 ng/mL (ref 0.00–0.08)

## 2018-01-23 LAB — PHOSPHORUS: Phosphorus: 4.5 mg/dL (ref 2.5–4.6)

## 2018-01-23 LAB — VITAMIN D, TOTAL (25OH): Lab: 15.6 — ABNORMAL LOW

## 2018-01-23 IMAGING — DX DG CHEST 2V
2 series · 2 of 2 positions shown · non-contrast
Comparison: Portable chest x-ray of [DATE]

CLINICAL DATA: Low potassium and magnesium, nausea, vomiting,
diarrhea, dizziness, history of Crohn's disease

EXAM:
CHEST - 2 VIEW

[w chest pa]
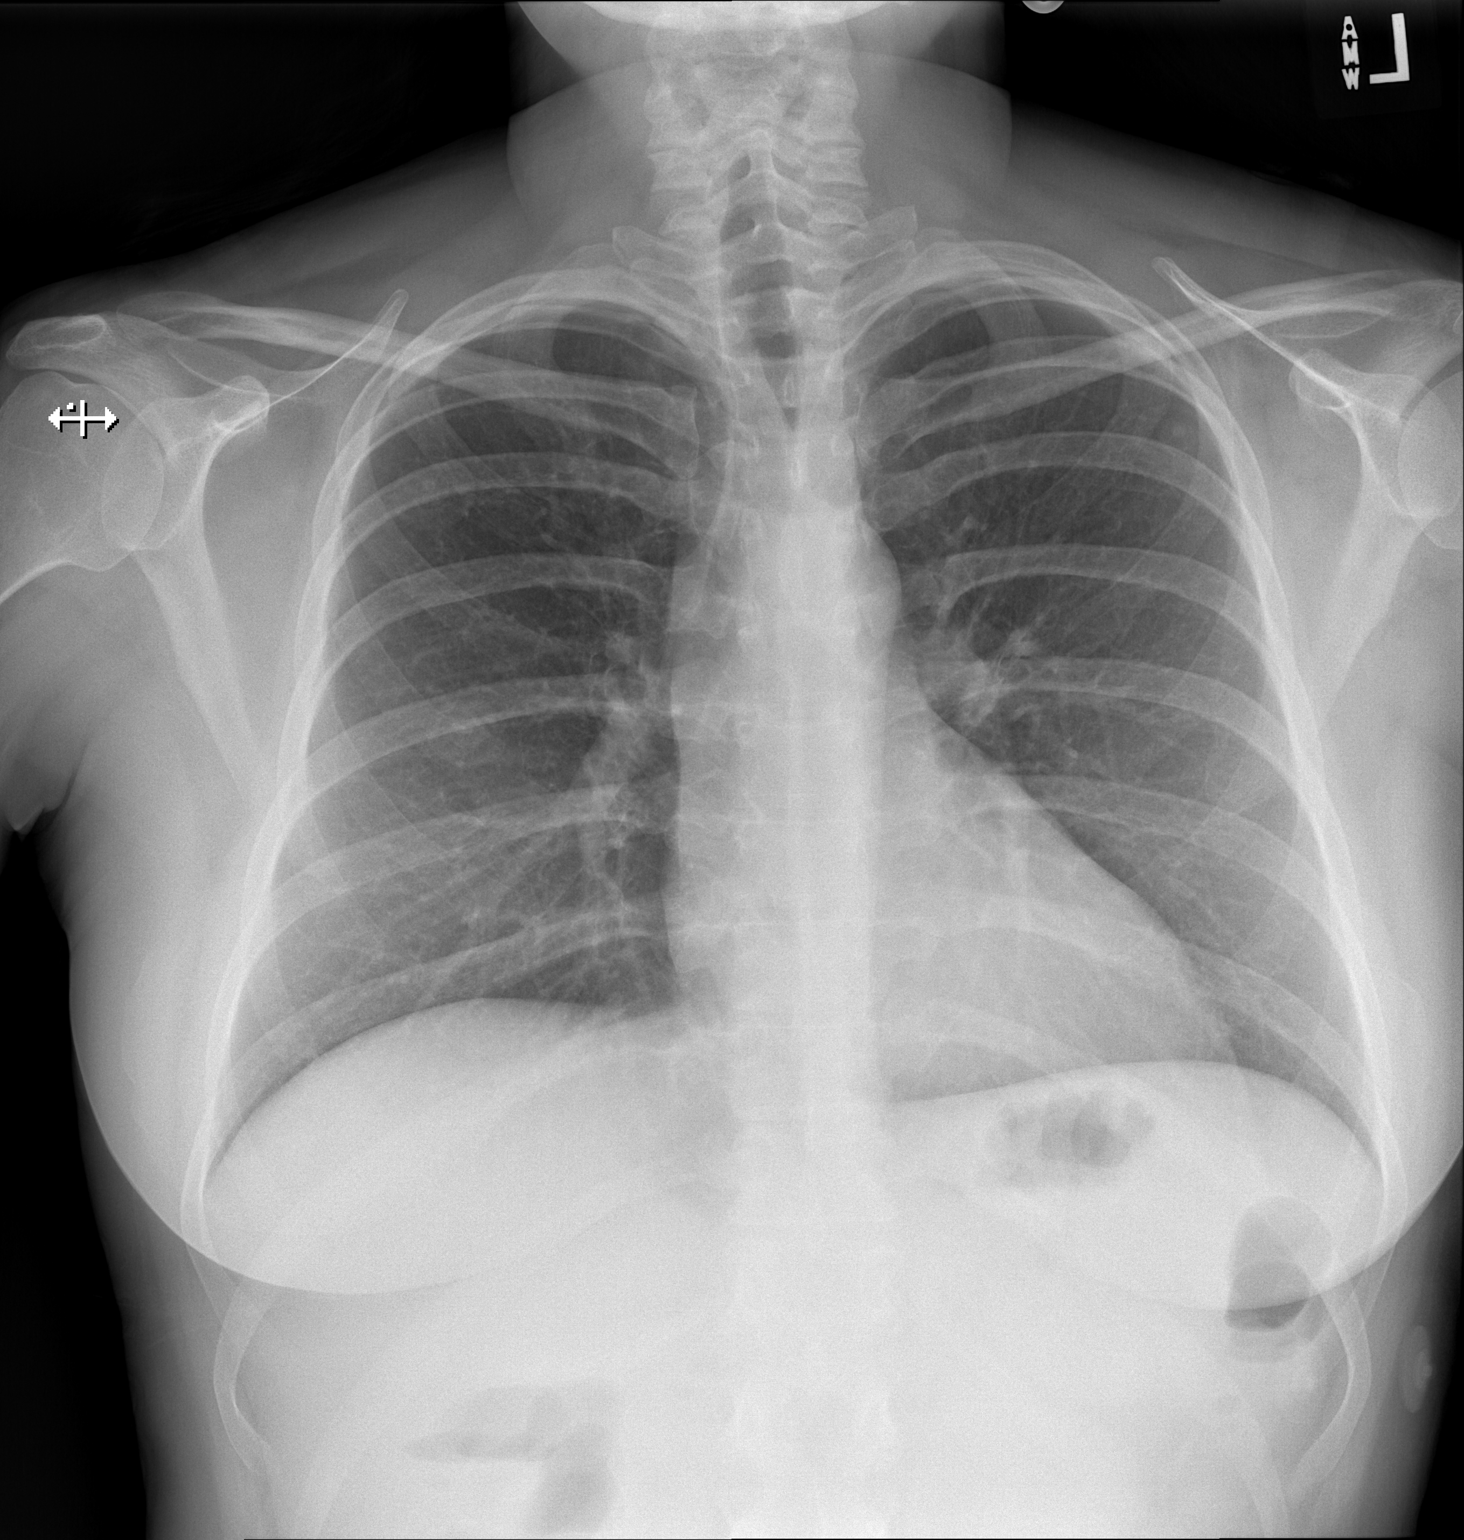

[w chest lat]
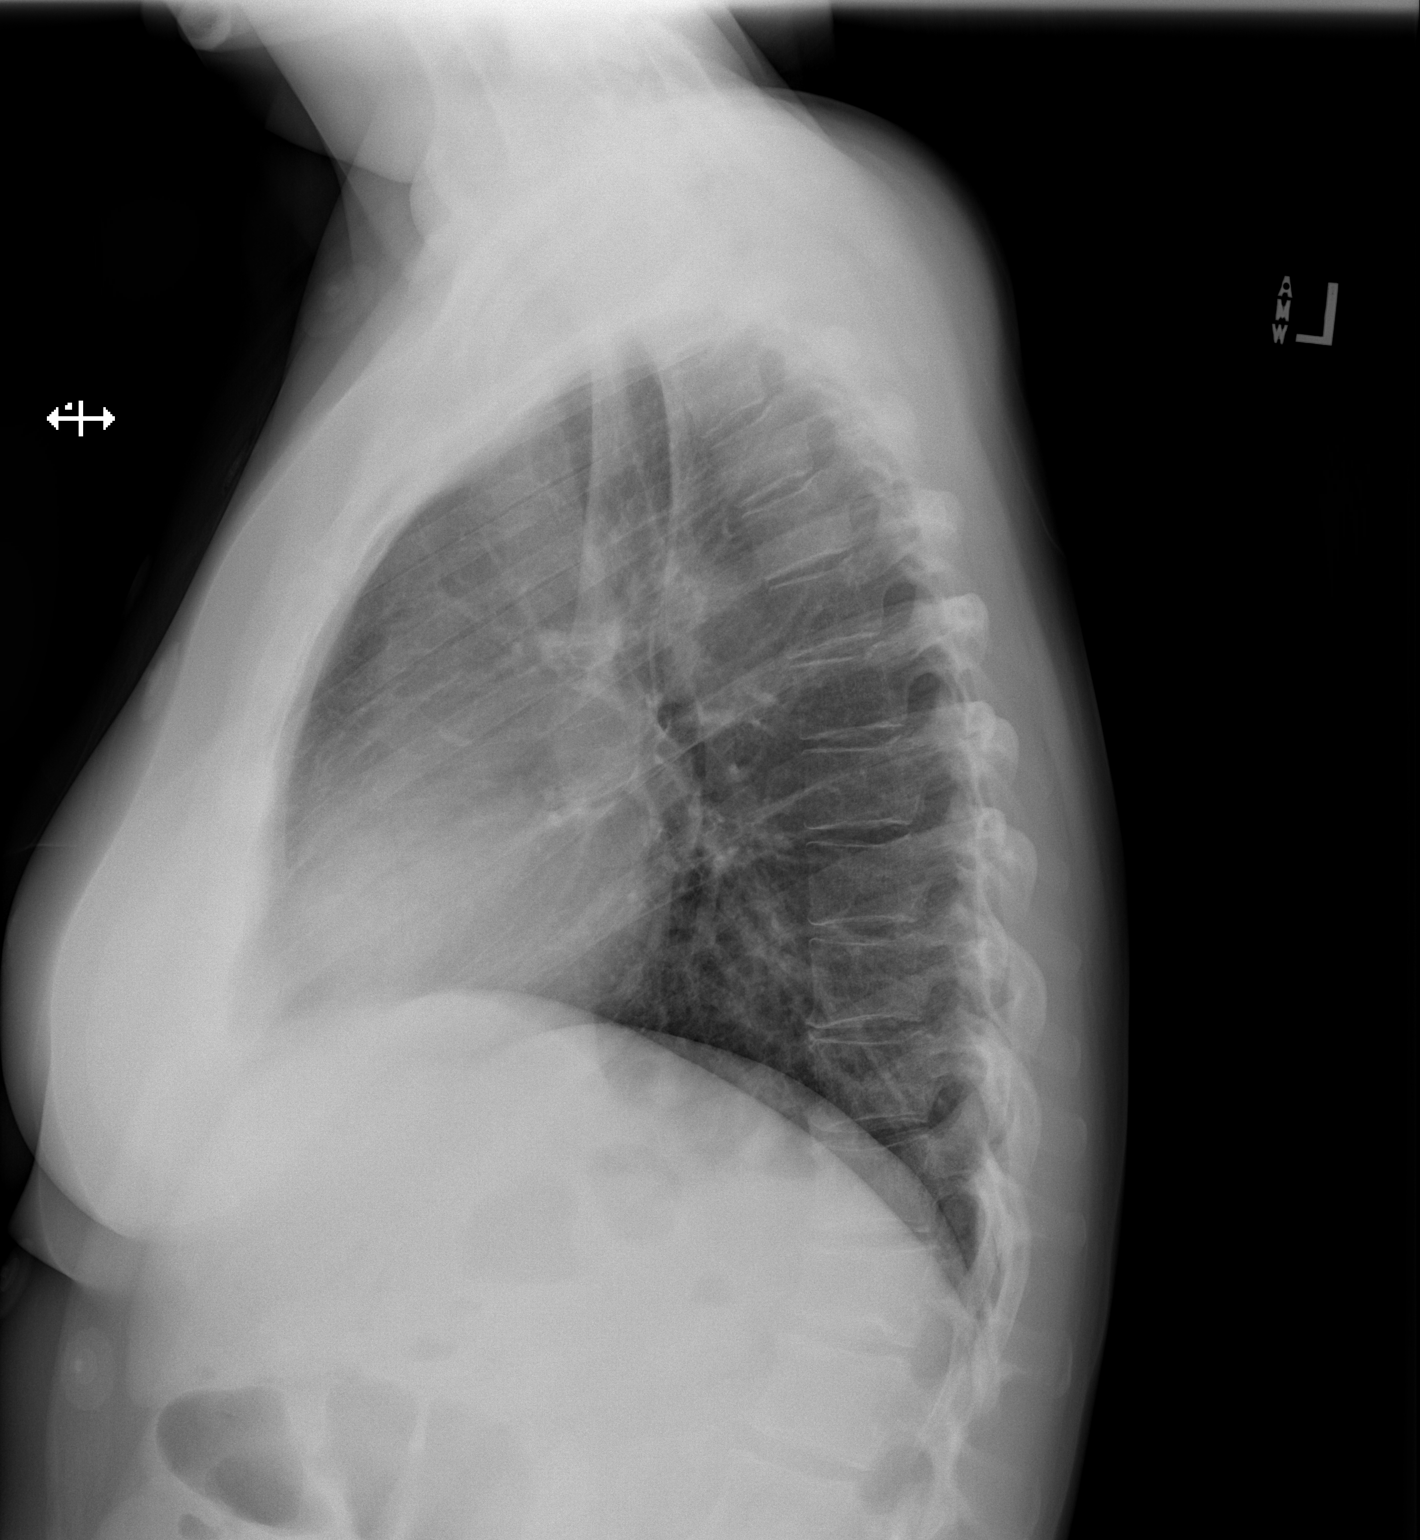

[2 of 2 positions shown; findings below may reference images not displayed]

FINDINGS: No active infiltrate or effusion is seen. Mediastinal and hilar
contours are unremarkable. The heart is within normal limits in
size. No acute bony abnormality is seen. There is very mild
compression of T12 vertebral body which was present on lateral chest
x-ray of [DATE].
IMPRESSION: 1. No active lung disease.
2. Very mild compression of T12 vertebral body which is not acute.

## 2018-01-23 MED ORDER — PANTOPRAZOLE SODIUM 40 MG PO TBEC: 40.0000 mg | DELAYED_RELEASE_TABLET | Freq: Two times a day (BID) | ORAL | Status: DC

## 2018-01-23 MED ORDER — PANTOPRAZOLE SODIUM 40 MG PO TBEC
40.0000 mg | DELAYED_RELEASE_TABLET | Freq: Two times a day (BID) | ORAL | Status: DC
  Administered 2018-01-24 (×2): 40 mg via ORAL
  Filled 2018-01-23 (×2): qty 1

## 2018-01-23 MED ORDER — ONDANSETRON HCL 4 MG/2ML IJ SOLN
4.0000 mg | Freq: Four times a day (QID) | INTRAMUSCULAR | Status: DC | PRN
  Administered 2018-01-24: 4 mg via INTRAVENOUS
  Filled 2018-01-23: qty 2

## 2018-01-23 MED ORDER — ADULT MULTIVITAMIN W/MINERALS CH
1.0000 | ORAL_TABLET | Freq: Every day | ORAL | Status: DC
  Administered 2018-01-24: 1 via ORAL
  Filled 2018-01-23: qty 1

## 2018-01-23 MED ORDER — POTASSIUM CHLORIDE 10 MEQ/100ML IV SOLN
10.0000 meq | INTRAVENOUS | Status: AC
  Administered 2018-01-23 – 2018-01-24 (×2): 10 meq via INTRAVENOUS
  Filled 2018-01-23 (×2): qty 100

## 2018-01-23 MED ORDER — ONDANSETRON HCL 4 MG PO TABS: 4.0000 mg | ORAL_TABLET | Freq: Four times a day (QID) | ORAL | Status: DC | PRN

## 2018-01-23 MED ORDER — TRAMADOL HCL 50 MG PO TABS
100.0000 mg | ORAL_TABLET | Freq: Four times a day (QID) | ORAL | Status: DC | PRN
  Administered 2018-01-23 – 2018-01-24 (×2): 100 mg via ORAL
  Filled 2018-01-23 (×2): qty 2

## 2018-01-23 MED ORDER — SODIUM CHLORIDE 0.9 % IV SOLN
INTRAVENOUS | Status: DC
  Administered 2018-01-23: 22:00:00 via INTRAVENOUS

## 2018-01-23 MED ORDER — DULOXETINE HCL 20 MG PO CPEP
20.0000 mg | ORAL_CAPSULE | Freq: Every day | ORAL | Status: DC
  Administered 2018-01-24: 20 mg via ORAL
  Filled 2018-01-23: qty 1

## 2018-01-23 MED ORDER — ACETAMINOPHEN 650 MG RE SUPP: 650.0000 mg | Freq: Four times a day (QID) | RECTAL | Status: DC | PRN

## 2018-01-23 MED ORDER — MAGNESIUM OXIDE 400 (241.3 MG) MG PO TABS
400.0000 mg | ORAL_TABLET | Freq: Two times a day (BID) | ORAL | Status: DC
  Administered 2018-01-24 (×2): 400 mg via ORAL
  Filled 2018-01-23 (×2): qty 1

## 2018-01-23 MED ORDER — ALPRAZOLAM 0.25 MG PO TABS
0.5000 mg | ORAL_TABLET | Freq: Three times a day (TID) | ORAL | Status: DC | PRN
  Administered 2018-01-23 – 2018-01-24 (×2): 0.5 mg via ORAL
  Filled 2018-01-23 (×2): qty 2

## 2018-01-23 MED ORDER — ACETAMINOPHEN 325 MG PO TABS
650.0000 mg | ORAL_TABLET | Freq: Four times a day (QID) | ORAL | Status: DC | PRN
  Administered 2018-01-24: 650 mg via ORAL
  Filled 2018-01-23: qty 2

## 2018-01-23 MED ORDER — FENTANYL CITRATE (PF) 100 MCG/2ML IJ SOLN
50.0000 ug | Freq: Once | INTRAMUSCULAR | Status: AC
  Administered 2018-01-23: 50 ug via INTRAVENOUS
  Filled 2018-01-23: qty 2

## 2018-01-23 MED ORDER — OXYCODONE-ACETAMINOPHEN 5-325 MG PO TABS
1.0000 | ORAL_TABLET | Freq: Once | ORAL | Status: AC
  Administered 2018-01-23: 1 via ORAL
  Filled 2018-01-23: qty 1

## 2018-01-23 MED ORDER — HYDRALAZINE HCL 20 MG/ML IJ SOLN: 10.0000 mg | INTRAMUSCULAR | Status: DC | PRN

## 2018-01-23 MED ORDER — POTASSIUM CHLORIDE 10 MEQ/100ML IV SOLN
10.0000 meq | Freq: Once | INTRAVENOUS | Status: AC
  Administered 2018-01-23: 10 meq via INTRAVENOUS
  Filled 2018-01-23: qty 100

## 2018-01-23 MED ORDER — MAGNESIUM SULFATE 50 % IJ SOLN
4.0000 g | Freq: Once | INTRAVENOUS | Status: AC
  Administered 2018-01-24: 4 g via INTRAVENOUS
  Filled 2018-01-23 (×2): qty 8

## 2018-01-23 MED ORDER — SODIUM CHLORIDE 0.9% FLUSH
3.0000 mL | Freq: Two times a day (BID) | INTRAVENOUS | Status: DC
  Administered 2018-01-23: 3 mL via INTRAVENOUS

## 2018-01-23 MED ORDER — METOPROLOL SUCCINATE ER 50 MG PO TB24
50.0000 mg | ORAL_TABLET | Freq: Every day | ORAL | Status: DC
  Administered 2018-01-24: 50 mg via ORAL
  Filled 2018-01-23: qty 1

## 2018-01-23 MED ORDER — POTASSIUM CHLORIDE CRYS ER 20 MEQ PO TBCR
20.0000 meq | EXTENDED_RELEASE_TABLET | Freq: Once | ORAL | Status: AC
  Administered 2018-01-23: 20 meq via ORAL
  Filled 2018-01-23: qty 1

## 2018-01-23 MED ORDER — BUDESONIDE 3 MG PO CPEP
9.0000 mg | ORAL_CAPSULE | Freq: Every day | ORAL | Status: DC
  Administered 2018-01-24: 9 mg via ORAL
  Filled 2018-01-23: qty 3

## 2018-01-23 MED ORDER — MAGNESIUM SULFATE 2 GM/50ML IV SOLN
2.0000 g | Freq: Once | INTRAVENOUS | Status: AC
  Administered 2018-01-23: 2 g via INTRAVENOUS
  Filled 2018-01-23: qty 50

## 2018-01-23 MED ORDER — OXYCODONE-ACETAMINOPHEN 5-325 MG PO TABS
1.0000 | ORAL_TABLET | Freq: Four times a day (QID) | ORAL | Status: DC | PRN
  Administered 2018-01-24 (×3): 1 via ORAL
  Filled 2018-01-23 (×3): qty 1

## 2018-01-23 MED ORDER — POTASSIUM CHLORIDE CRYS ER 20 MEQ PO TBCR
20.0000 meq | EXTENDED_RELEASE_TABLET | Freq: Every day | ORAL | Status: DC
  Filled 2018-01-23: qty 1

## 2018-01-23 NOTE — H&P (Signed)
History and Physical    NARY SNEED QQV:[id number] DOB: 1981-02-23 DOA: 01/23/2018  PCP: Patient, [name]   Patient coming from: Home  Chief Complaint: Abnormal outpatient labs   HPI: [name] is a 37 y.o. female with medical history significant for Crohn's disease on steroids, chronic pain, polysubstance abuse in early remission, depression with anxiety, and hypertension, now presenting to the emergency department at the direction of her gastroenterologist for evaluation of critically low magnesium on routine outpatient blood work.  Patient was following up with her gastroenterologist, routine blood work was obtained, and she was called and instructed to go to the ED for management of critically low magnesium.  Patient reports that her Crohn's has been stable and she continues to take her oral magnesium and potassium supplements at home.  She reports occasional palpitations over the past few days.  No muscle spasms or tetany.  ED Course: Upon arrival to the ED, patient is found to be afebrile, saturating well on room air, tachycardic in the 120s, and mildly hypertensive.  EKG features a sinus tachycardia with rate 131.  Chest x-ray is negative for acute cardiopulmonary disease.  Chemistry panel is concerning for potassium 2.8, magnesium 0.7, and creatinine 1.14, improved from recent priors.  CBC is notable for a chronic leukocytosis, improved chronic microcytic anemia, and chronic thrombocytosis.  Troponin is undetectable.  Patient was treated with fentanyl, 2 g IV magnesium, 10 mEq IV potassium, and Percocet in the ED.  Tachycardia resolved, blood pressure remained stable, and she will be admitted for ongoing evaluation and management.  Review of Systems:  All other systems reviewed and apart from HPI, are negative.  Past Medical History:  Diagnosis Date  . Abdominal pain, unspecified site 03/24/2009  . 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 ADHD 12/02/2009  . Allergic rhinitis, cause  unspecified 01/21/2011  . ANXIETY 03/24/2009  . B12 DEFICIENCY 04/28/2009  . Bronchitis 12/2017  . BURSITIS, RIGHT KNEE 02/05/2010  . Cellulitis and abscess of leg, except foot 02/05/2010  . Cervicalgia 12/02/2009  . COMMON MIGRAINE 02/05/2010  . CROHN'S [facility] INTESTINE 05/19/2009  . ECZEMA 05/20/2010  . Endometriosis 08/04/2011  . Fibromyalgia   . GERD 03/24/2009  . HEADACHE, CHRONIC 03/24/2009  . HYPERTENSION 03/24/2009  . Osteoarthritis   . OTITIS MEDIA, LEFT 08/12/2010  . SMOKER 12/02/2009  . Spine pain 01/21/2011   neck and thoracic spine  . VITAMIN B1 DEFICIENCY 09/21/2009  . Wheezing 08/12/2010    Past Surgical History:  Procedure Laterality Date  . BREAST ENHANCEMENT SURGERY  2004  . ENDOMETRIAL ABLATION  01/2009   Thinks laproscopic with possible transvaginal  . RECTAL PROLAPSE REPAIR       reports that she has been smoking cigarettes.  She has a 7.00 pack-year smoking history. She has never used smokeless tobacco. She reports that she drinks alcohol. She reports that she has current or past drug history. Drug: Other-see comments.  Allergies  Allergen Reactions  . Penicillins Hives    Has patient had a PCN reaction causing immediate rash, facial/tongue/throat swelling, SOB or lightheadedness with hypotension: Yes Has patient had a PCN reaction causing severe rash involving mucus membranes or skin necrosis: Unk Has patient had a PCN reaction that required hospitalization: Yes Has patient had a PCN reaction occurring within the last 10 years: No If all of the above answers are "NO", then may proceed with Cephalosporin use.   Marland Kitchen Doxycycline Other (See Comments)    Patient cannot recall the reaction  Family History  Problem Relation Age of Onset  . Cancer Mother        breast  . Clotting disorder Mother   . Cancer Maternal Grandmother        Stomach Cancer  . Cancer Maternal Grandfather        Esophageal Cancer     Prior to Admission medications   Medication Sig  Start Date End Date Taking? Authorizing Provider  ALPRAZolam Duanne Moron) 0.5 MG tablet Take 1 tablet (0.5 mg total) by mouth 3 (three) times daily as needed for anxiety. 12/20/17   Caren Griffins, MD  budesonide (ENTOCORT EC) 3 MG 24 hr capsule Take 3 capsules (9 mg total) by mouth daily. 10/30/13   Kelvin Cellar, MD  DULoxetine (CYMBALTA) 20 MG capsule Take 20 mg by mouth daily.    [provider]  esomeprazole (NEXIUM) 40 MG capsule Take 1 capsule (40 mg total) by mouth 2 (two) times daily before a meal. 12/20/17   Gherghe, Vella Redhead, MD  lidocaine (XYLOCAINE) 2 % solution 15 mls every four hours for esophogeal pain. 12/30/17   Nehemiah Settle, NP  magnesium oxide (MAG-OX) 400 MG tablet Take 1 tablet (400 mg total) by mouth 2 (two) times daily. 12/20/17   Caren Griffins, MD  metoprolol succinate (TOPROL-XL) 50 MG 24 hr tablet Take 1 tablet (50 mg total) by mouth daily. Take with or immediately following a meal. 12/20/17   Gherghe, Vella Redhead, MD  Multiple Vitamin (MULTIVITAMIN WITH MINERALS) TABS tablet Take 1 tablet by mouth daily. 12/21/17   Caren Griffins, MD  naproxen sodium (ALEVE) 220 MG tablet Take 220-440 mg by mouth 2 (two) times daily as needed (for pain or discomfort).    [provider]  oxyCODONE-acetaminophen (PERCOCET/ROXICET) 5-325 MG tablet Take 1 tablet by mouth every 6 (six) hours as needed for severe pain. 12/30/17   Nehemiah Settle, NP  potassium chloride SA (K-DUR,KLOR-CON) 20 MEQ tablet Take 1 tablet (20 mEq total) by mouth daily. 12/20/17   Caren Griffins, MD  amLODipine (NORVASC) 10 MG tablet Take 1 tablet (10 mg total) by mouth daily. 09/30/11 11/01/11  Biagio Borg, MD  pregabalin (LYRICA) 50 MG capsule Take 1 capsule (50 mg total) by mouth 3 (three) times daily. 09/30/11 11/01/11  Biagio Borg, MD    Physical Exam: Vitals:   01/23/18 1815 01/23/18 1845 01/23/18 1900 01/23/18 1915  BP: (!) 150/100 (!) 145/110 (!) 145/98 (!) 135/96  Pulse: 98 92 88  85  Resp: (!) 24 14 (!) 22   Temp:      TempSrc:      SpO2: 100% 99% 100% 96%  Weight:      Height:          Constitutional: NAD, calm  Eyes: PERTLA, lids and conjunctivae normal ENMT: Mucous membranes are moist. Posterior pharynx clear of any exudate or lesions.   Neck: normal, supple, no masses, no thyromegaly Respiratory: clear to auscultation bilaterally, no wheezing, no crackles. Normal respiratory effort.    Cardiovascular: S1 & S2 heard, regular rate and rhythm. No extremity edema. No significant JVD. Abdomen: No distension, no tenderness, soft. Bowel sounds active.  Musculoskeletal: no clubbing / cyanosis. No joint deformity upper and lower extremities.   Skin: no significant rashes, lesions, ulcers. Warm, dry, well-perfused. Neurologic: CN 2-12 grossly intact. Sensation intact. Strength 5/5 in all 4 limbs.  Psychiatric: Alert and oriented x 3. Calm, cooperative.     Labs on Admission: I  have personally reviewed following labs and imaging studies  CBC: Recent Labs  Lab 01/23/18 1614  WBC 19.2*  HGB 10.1*  HCT 36.4  MCV 77.6*  PLT 035*   Basic Metabolic Panel: Recent Labs  Lab 01/23/18 1614  NA 141  K 2.8*  CL 110  CO2 16*  GLUCOSE 120*  BUN 21*  CREATININE 1.14*  CALCIUM 8.3*  MG 0.7*   GFR: Estimated Creatinine Clearance: 56.4 mL/min (A) (by C-G formula based on SCr of 1.14 mg/dL (H)). Liver Function Tests: No results for input(s): AST, ALT, ALKPHOS, BILITOT, PROT, ALBUMIN in the last 168 hours. No results for input(s): LIPASE, AMYLASE in the last 168 hours. No results for input(s): AMMONIA in the last 168 hours. Coagulation Profile: No results for input(s): INR, PROTIME in the last 168 hours. Cardiac Enzymes: No results for input(s): CKTOTAL, CKMB, CKMBINDEX, TROPONINI in the last 168 hours. BNP (last 3 results) No results for input(s): PROBNP in the last 8760 hours. HbA1C: No results for input(s): HGBA1C in the last 72 hours. CBG: No  results for input(s): GLUCAP in the last 168 hours. Lipid Profile: No results for input(s): CHOL, HDL, LDLCALC, TRIG, CHOLHDL, LDLDIRECT in the last 72 hours. Thyroid Function Tests: No results for input(s): TSH, T4TOTAL, FREET4, T3FREE, THYROIDAB in the last 72 hours. Anemia Panel: No results for input(s): VITAMINB12, FOLATE, FERRITIN, TIBC, IRON, RETICCTPCT in the last 72 hours. Urine analysis:    Component Value Date/Time   COLORURINE YELLOW 01/23/2018 1936   APPEARANCEUR HAZY (A) 01/23/2018 1936   LABSPEC 1.020 01/23/2018 1936   PHURINE 5.0 01/23/2018 1936   GLUCOSEU NEGATIVE 01/23/2018 1936   HGBUR SMALL (A) 01/23/2018 1936   BILIRUBINUR NEGATIVE 01/23/2018 1936   KETONESUR NEGATIVE 01/23/2018 1936   PROTEINUR 100 (A) 01/23/2018 1936   UROBILINOGEN 0.2 10/22/2010 0038   NITRITE NEGATIVE 01/23/2018 1936   LEUKOCYTESUR NEGATIVE 01/23/2018 1936   Sepsis Labs: @LABRCNTIP (procalcitonin:4,lacticidven:4) )No results found for this or any previous visit (from the past 240 hour(s)).   Radiological Exams on Admission: Dg Chest 2 View  Result Date: 01/23/2018 CLINICAL DATA:  Low potassium and magnesium, nausea, vomiting, diarrhea, dizziness, history of Crohn's disease EXAM: CHEST - 2 VIEW COMPARISON:  Portable chest x-ray of 12/16/2017 FINDINGS: No active infiltrate or effusion is seen. Mediastinal and hilar contours are unremarkable. The heart is within normal limits in size. No acute bony abnormality is seen. There is very mild compression of T12 vertebral body which was present on lateral chest x-ray of 12/06/2017. IMPRESSION: 1. No active lung disease. 2. Very mild compression of T12 vertebral body which is not acute. Electronically Signed   By: Ivar Drape M.D.   On: 01/23/2018 17:13    EKG: Independently reviewed. Sinus tachycardia (rate 131).   Assessment/Plan  1. Hypomagnesemia; hypokalemia  - Presents at direction of her gastroenterologist for critically low magnesium on  routine outpatient labs  - Magnesium level is 0.7 on admission, potassium 2.8  - Chronically low, taking oral supplements at home  - She reports occasional palpitations, tachycardia resolved with IVF in ED, no arrhythmia   - Treated in ED with 2 g IV mag, 10 mEq IV potassium  - Continue cardiac monitoring, start magnesium infusion, continue oral supplements    2. Crohn's disease  - Follows with GI at [facility] and planning to start Stelara pending insurance approval  - Currently managed with budesonide only, will continue     3. Mild renal insufficiency  - SCr is 1.14  on admission, improved from recent priors  - Renally-dose medications, avoid nephrotoxins    4. Chronic pain  - No pain complaints on admission  - Continue home-regimen with prn Percocet    5. Depression, anxiety   - Stable  - Continue Cymbalta and Xanax    6. Hypertension  - BP elevated in ED  - Continue Toprol    7. Leukocytosis; thrombocytosis  - WBC is 19k and platelets 639k on admission  - Both indices similar to priors, likely secondary to steroids and reactive from Crohn's disease     DVT prophylaxis: SCD's  Code Status: Full  Family Communication: Discussed with patient Consults called: None  Admission status: Observation     Vianne Bulls, MD [facility] Pager [phone]  If 7PM-7AM, please contact night-coverage [email or web address] Password [facility]  01/23/2018, 8:39 PM

## 2018-01-23 NOTE — ED Notes (Signed)
Pt informed she needs to provide a UA when possible; Pt verbalized understanding.

## 2018-01-23 NOTE — ED Triage Notes (Signed)
Pt's GI MD told her that her  K and Mg are low and she needed to come to ED. Pt has pain all over and n/v/diarrhea, dizziness. Pt has hx of Crohns disease. Pt also reports CP/SOB.

## 2018-01-23 NOTE — ED Notes (Signed)
Attempted Report 

## 2018-01-23 NOTE — Progress Notes (Signed)
Receive report

## 2018-01-23 NOTE — ED Provider Notes (Signed)
[place] [facility] EMERGENCY DEPARTMENT Provider Note   CSN: [id number] Arrival date & time: 01/23/18  1550     History   Chief Complaint Chief Complaint  Patient presents with  . Abnormal Lab  . Chest Pain    HPI ERIE SICA is a 37 y.o. female.  HPI   Patient is a 37 year old female with past medical history significant for Crohn's.  She is followed at [facility] gastroenterology, Dr. Kathlen Mody.  She reports that she sometimes has mildly low potassium and magnesium and is on supplemental as an outpatient.  She has not changed her dosing recently.  However she went to her gastroneurology for follow-up and a was found to have abnormal labs and sent immediately to the emergency department.  Today patient's potassium is 2.8, magnesium is 0.7.  Patient has sinus tachycardia on her EKG.  Her symptoms are fatigue, chronic abdominal pain.  Past Medical History:  Diagnosis Date  . Abdominal pain, unspecified site 03/24/2009  . 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 ADHD 12/02/2009  . Allergic rhinitis, cause unspecified 01/21/2011  . ANXIETY 03/24/2009  . B12 DEFICIENCY 04/28/2009  . Bronchitis 12/2017  . BURSITIS, RIGHT KNEE 02/05/2010  . Cellulitis and abscess of leg, except foot 02/05/2010  . Cervicalgia 12/02/2009  . COMMON MIGRAINE 02/05/2010  . CROHN'S [facility] INTESTINE 05/19/2009  . ECZEMA 05/20/2010  . Endometriosis 08/04/2011  . Fibromyalgia   . GERD 03/24/2009  . HEADACHE, CHRONIC 03/24/2009  . HYPERTENSION 03/24/2009  . Osteoarthritis   . OTITIS MEDIA, LEFT 08/12/2010  . SMOKER 12/02/2009  . Spine pain 01/21/2011   neck and thoracic spine  . VITAMIN B1 DEFICIENCY 09/21/2009  . Wheezing 08/12/2010    Patient Active Problem List   Diagnosis Date Noted  . ARDS (adult respiratory distress syndrome) ([place])   . Leukocytosis   . Tobacco abuse   . Alcohol abuse   . Polysubstance abuse ([place])   . Attention deficit hyperactivity disorder (ADHD)   . Vitamin B12 deficiency   .  Fibromyalgia   . Crohn's disease with complication ([place])   . Uncomplicated asthma   . Tachycardia   . Tachypnea   . Acute blood loss anemia   . Thrombocytosis ([place])   . 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 Acute respiratory failure with hypoxemia ([place])   . Acute respiratory failure with hypoxia ([place]) 12/08/2017  . Acute sinusitis 12/07/2017  . Acute kidney injury ([place])   . Hypocalcemia 12/06/2017  . Hypomagnesemia 12/06/2017  . Bronchitis 12/06/2017  . Sepsis ([place]) 12/06/2017  . AKI (acute kidney injury) ([place]) 12/06/2017  . 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 Abdominal pain 10/09/2013  . Rectal prolapse 10/09/2013  . Blurred vision, bilateral 03/28/2012  . Thoracic spine pain 03/28/2012  . Chronic narcotic dependence ([place]) 11/02/2011  . Abdominal pain, generalized 11/02/2011  . Hypokalemia 11/02/2011  . Radiculopathy 11/02/2011  . Bilateral shoulder pain 10/01/2011  . Cervical radiculopathy 09/30/2011  . Pain, joint, multiple sites 09/30/2011  . Lumbar radicular pain 08/04/2011  . Chronic neck pain 08/04/2011  . Endometriosis 08/04/2011  . Allergic rhinitis, cause unspecified 01/21/2011  . Chronic back pain 01/21/2011  . B12 deficiency 12/13/2010  . Preventative health care 12/06/2010  . ECZEMA 05/20/2010  . COMMON MIGRAINE 02/05/2010  . ADHD 12/02/2009  . 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 CROHN'S [facility] INTESTINE 05/19/2009  . Anxiety state 03/24/2009  . Essential hypertension 03/24/2009  . GERD 03/24/2009  . HEADACHE, CHRONIC 03/24/2009  Past Surgical History:  Procedure Laterality Date  . BREAST ENHANCEMENT SURGERY  2004  . ENDOMETRIAL ABLATION  01/2009   Thinks laproscopic with possible transvaginal  . RECTAL PROLAPSE REPAIR       OB History   None      Home Medications    Prior to Admission medications   Medication Sig Start Date End Date Taking? Authorizing Provider  ALPRAZolam Duanne Moron) 0.5 MG  tablet Take 1 tablet (0.5 mg total) by mouth 3 (three) times daily as needed for anxiety. 12/20/17   Caren Griffins, MD  budesonide (ENTOCORT EC) 3 MG 24 hr capsule Take 3 capsules (9 mg total) by mouth daily. 10/30/13   Kelvin Cellar, MD  DULoxetine (CYMBALTA) 20 MG capsule Take 20 mg by mouth daily.    [provider]  esomeprazole (NEXIUM) 40 MG capsule Take 1 capsule (40 mg total) by mouth 2 (two) times daily before a meal. 12/20/17   Gherghe, Vella Redhead, MD  lidocaine (XYLOCAINE) 2 % solution 15 mls every four hours for esophogeal pain. 12/30/17   Nehemiah Settle, NP  magnesium oxide (MAG-OX) 400 MG tablet Take 1 tablet (400 mg total) by mouth 2 (two) times daily. 12/20/17   Caren Griffins, MD  metoprolol succinate (TOPROL-XL) 50 MG 24 hr tablet Take 1 tablet (50 mg total) by mouth daily. Take with or immediately following a meal. 12/20/17   Gherghe, Vella Redhead, MD  Multiple Vitamin (MULTIVITAMIN WITH MINERALS) TABS tablet Take 1 tablet by mouth daily. 12/21/17   Caren Griffins, MD  naproxen sodium (ALEVE) 220 MG tablet Take 220-440 mg by mouth 2 (two) times daily as needed (for pain or discomfort).    [provider]  oxyCODONE-acetaminophen (PERCOCET/ROXICET) 5-325 MG tablet Take 1 tablet by mouth every 6 (six) hours as needed for severe pain. 12/30/17   Nehemiah Settle, NP  potassium chloride SA (K-DUR,KLOR-CON) 20 MEQ tablet Take 1 tablet (20 mEq total) by mouth daily. 12/20/17   Caren Griffins, MD  amLODipine (NORVASC) 10 MG tablet Take 1 tablet (10 mg total) by mouth daily. 09/30/11 11/01/11  Biagio Borg, MD  pregabalin (LYRICA) 50 MG capsule Take 1 capsule (50 mg total) by mouth 3 (three) times daily. 09/30/11 11/01/11  Biagio Borg, MD    Family History Family History  Problem Relation Age of Onset  . Cancer Mother        breast  . Clotting disorder Mother   . Cancer Maternal Grandmother        Stomach Cancer  . Cancer Maternal Grandfather         Esophageal Cancer    Social History Social History   Tobacco Use  . Smoking status: Current Every Day Smoker    Packs/day: 0.50    Years: 14.00    Pack years: 7.00    Types: Cigarettes  . Smokeless tobacco: Never Used  . Tobacco comment: 5 daily  Substance Use Topics  . Alcohol use: Yes    Comment: occasionally  . Drug use: Not Currently    Types: Other-see comments    Comment: oral narcotics, family says she does not use IV drugs     Allergies   Penicillins and Doxycycline   Review of Systems Review of Systems  Constitutional: Positive for fatigue. Negative for activity change.  Respiratory: Negative for shortness of breath.   Cardiovascular: Negative for chest pain.  Gastrointestinal: Positive for abdominal pain.  All other systems reviewed and are  negative.    Physical Exam Updated Vital Signs BP (!) 150/105 (BP Location: Right Arm)   Pulse 98   Temp 98.9 F (37.2 C) (Oral)   Resp 16   Ht 5' 3"  (1.6 m)   Wt 59 kg (130 lb)   LMP 01/23/2018   SpO2 100%   BMI 23.03 kg/m   Physical Exam  Constitutional: She is oriented to person, place, and time. She appears well-developed and well-nourished.  HENT:  Head: Normocephalic and atraumatic.  Eyes: Right eye exhibits no discharge.  Cardiovascular: Normal rate, regular rhythm, intact distal pulses and normal pulses.  Pulmonary/Chest: Effort normal and breath sounds normal. She has no decreased breath sounds. She has no wheezes.  Abdominal: Soft. Bowel sounds are normal. There is no tenderness.  Neurological: She is oriented to person, place, and time.  Skin: Skin is warm and dry. She is not diaphoretic.  Psychiatric: She has a normal mood and affect.  Nursing note and vitals reviewed.    ED Treatments / Results  Labs (all labs ordered are listed, but only abnormal results are displayed) Labs Reviewed  BASIC METABOLIC PANEL - Abnormal; Notable for the following components:      Result Value   Potassium  2.8 (*)    CO2 16 (*)    Glucose, Bld 120 (*)    BUN 21 (*)    Creatinine, Ser 1.14 (*)    Calcium 8.3 (*)    All other components within normal limits  CBC - Abnormal; Notable for the following components:   WBC 19.2 (*)    Hemoglobin 10.1 (*)    MCV 77.6 (*)    MCH 21.5 (*)    MCHC 27.7 (*)    RDW 21.0 (*)    Platelets 639 (*)    All other components within normal limits  MAGNESIUM - Abnormal; Notable for the following components:   Magnesium 0.7 (*)    All other components within normal limits  URINALYSIS, ROUTINE W REFLEX MICROSCOPIC  I-STAT TROPONIN, ED  I-STAT BETA HCG BLOOD, ED ([facility], [facility], [facility] ONLY)    EKG EKG Interpretation  Date/Time:  Tuesday January 23 2018 16:40:46 EDT Ventricular Rate:  131 PR Interval:  138 QRS Duration: 70 QT Interval:  308 QTC Calculation: 027 R Axis:   74 Text Interpretation:  Sinus tachycardia Biatrial enlargement Since last tracing rate slower Confirmed by [place], Valdez [phone]) on 01/23/2018 6:00:50 PM   Radiology Dg Chest 2 View  Result Date: 01/23/2018 CLINICAL DATA:  Low potassium and magnesium, nausea, vomiting, diarrhea, dizziness, history of Crohn's disease EXAM: CHEST - 2 VIEW COMPARISON:  Portable chest x-ray of 12/16/2017 FINDINGS: No active infiltrate or effusion is seen. Mediastinal and hilar contours are unremarkable. The heart is within normal limits in size. No acute bony abnormality is seen. There is very mild compression of T12 vertebral body which was present on lateral chest x-ray of 12/06/2017. IMPRESSION: 1. No active lung disease. 2. Very mild compression of T12 vertebral body which is not acute. Electronically Signed   By: Ivar Drape M.D.   On: 01/23/2018 17:13    Procedures Procedures (including critical care time)  CRITICAL CARE Performed by: Gardiner Sleeper Total critical care time: 45 minutes Critical care time was exclusive of separately billable procedures and treating other patients. Critical care was  necessary to treat or prevent imminent or life-threatening deterioration. Critical care was time spent personally by me on the following activities: development of treatment plan with patient  and/or surrogate as well as nursing, discussions with consultants, evaluation of patient's response to treatment, examination of patient, obtaining history from patient or surrogate, ordering and performing treatments and interventions, ordering and review of laboratory studies, ordering and review of radiographic studies, pulse oximetry and re-evaluation of patient's condition.   Medications Ordered in ED Medications  magnesium sulfate IVPB 2 g 50 mL (has no administration in time range)  potassium chloride 10 mEq in 100 mL IVPB (has no administration in time range)     Initial Impression / Assessment and Plan / ED Course  I have reviewed the triage vital signs and the nursing notes.  Pertinent labs & imaging results that were available during my care of the patient were reviewed by me and considered in my medical decision making (see chart for details).     Patient is a 37 year old female with past medical history significant for Crohn's.  She is followed at [facility] gastroenterology, Dr. Kathlen Mody.  She reports that she sometimes has mildly low potassium and magnesium and is on supplemental as an outpatient.  She has not changed her dosing recently.  However she went to her gastroneurology for follow-up and a was found to have abnormal labs and sent immediately to the emergency department.  Today patient's potassium is 2.8, magnesium is 0.7.  Patient has sinus tachycardia on her EKG.  Her symptoms are fatigue, chronic abdominal pain.  6:40 PM Will start K and Mag.   Final Clinical Impressions(s) / ED Diagnoses   Final diagnoses:  None    ED Discharge Orders    None       Macarthur Critchley, MD 01/23/18 [phone]

## 2018-01-23 NOTE — ED Provider Notes (Signed)
Patient placed in Quick Look pathway, seen and evaluated   Chief Complaint: vomiting, diarrhea, abdominal pain  HPI:   Patient has hx of Crohn's disease for the past 9 years. Was seen routinely by GI doctor on Friday. Bloodwork was done. She was told by PCP today that Mg and K were low. States her K usually runs low. She also reports fatigue and SOB. Checked temperature yesterday, reports fever at 99.7. Abdominal pain is typical for her too d/t Crohn's, denies anything different. Denies prior MI, PE, urinary symptoms.  ROS: vomiting, diarrhea  Physical Exam:   Gen: No distress  Neuro: Awake and Alert  Skin: Warm    Focused Exam: No abdominal TTP. Tachycardic. Lungs CTAB.  Room soon, redraw Mg and K, obtain EKG.   Initiation of care has begun. The patient has been counseled on the process, plan, and necessity for staying for the completion/evaluation, and the remainder of the medical screening examination    Delia Heady, PA-C 01/23/18 1622    Mackuen, Fredia Sorrow, MD 01/24/18 0001

## 2018-01-24 DIAGNOSIS — E876 Hypokalemia: Secondary | ICD-10-CM

## 2018-01-24 DIAGNOSIS — G43809 Other migraine, not intractable, without status migrainosus: Secondary | ICD-10-CM | POA: Diagnosis not present

## 2018-01-24 DIAGNOSIS — I10 Essential (primary) hypertension: Secondary | ICD-10-CM | POA: Diagnosis not present

## 2018-01-24 LAB — COMPREHENSIVE METABOLIC PANEL
ALT: 20 U/L (ref 14–54)
AST: 13 U/L — ABNORMAL LOW (ref 15–41)
Albumin: 3.4 g/dL — ABNORMAL LOW (ref 3.5–5.0)
Alkaline Phosphatase: 75 U/L (ref 38–126)
Anion gap: 8 (ref 5–15)
BUN: 20 mg/dL (ref 6–20)
CO2: 16 mmol/L — ABNORMAL LOW (ref 22–32)
Calcium: 7.4 mg/dL — ABNORMAL LOW (ref 8.9–10.3)
Chloride: 114 mmol/L — ABNORMAL HIGH (ref 101–111)
Creatinine, Ser: 0.96 mg/dL (ref 0.44–1.00)
GFR calc Af Amer: >60 mL/min (ref >60)
GFR calc non Af Amer: >60 mL/min (ref >60)
Glucose, Bld: 96 mg/dL (ref 65–99)
Potassium: 3.1 mmol/L — ABNORMAL LOW (ref 3.5–5.1)
Sodium: 138 mmol/L (ref 135–145)
Total Bilirubin: 0.4 mg/dL (ref 0.3–1.2)
Total Protein: 5.8 g/dL — ABNORMAL LOW (ref 6.5–8.1)

## 2018-01-24 LAB — CBC
HCT: 29.3 % — ABNORMAL LOW (ref 36.0–46.0)
Hemoglobin: 8.3 g/dL — ABNORMAL LOW (ref 12.0–15.0)
MCH: 21.7 pg — ABNORMAL LOW (ref 26.0–34.0)
MCHC: 28.3 g/dL — ABNORMAL LOW (ref 30.0–36.0)
MCV: 76.5 fL — ABNORMAL LOW (ref 78.0–100.0)
Platelets: 565 10*3/uL — ABNORMAL HIGH (ref 150–400)
RBC: 3.83 MIL/uL — ABNORMAL LOW (ref 3.87–5.11)
RDW: 20.8 % — ABNORMAL HIGH (ref 11.5–15.5)
WBC: 17 10*3/uL — ABNORMAL HIGH (ref 4.0–10.5)

## 2018-01-24 LAB — MAGNESIUM: Magnesium: 2.5 mg/dL — ABNORMAL HIGH (ref 1.7–2.4)

## 2018-01-24 MED ORDER — PANTOPRAZOLE SODIUM 40 MG PO TBEC: 40.0000 mg | DELAYED_RELEASE_TABLET | Freq: Every day | ORAL | 0 refills | Status: DC

## 2018-01-24 MED ORDER — POTASSIUM CHLORIDE CRYS ER 20 MEQ PO TBCR: 20.0000 meq | EXTENDED_RELEASE_TABLET | Freq: Two times a day (BID) | ORAL | Status: DC

## 2018-01-24 MED ORDER — PROCHLORPERAZINE EDISYLATE 10 MG/2ML IJ SOLN
10.0000 mg | Freq: Once | INTRAMUSCULAR | Status: AC
  Administered 2018-01-24: 10 mg via INTRAVENOUS
  Filled 2018-01-24: qty 2

## 2018-01-24 MED ORDER — MAGNESIUM OXIDE 400 (241.3 MG) MG PO TABS: 800.0000 mg | ORAL_TABLET | Freq: Two times a day (BID) | ORAL | 0 refills | Status: DC

## 2018-01-24 MED ORDER — KETOROLAC TROMETHAMINE 30 MG/ML IJ SOLN
30.0000 mg | Freq: Once | INTRAMUSCULAR | Status: AC
  Administered 2018-01-24: 30 mg via INTRAVENOUS
  Filled 2018-01-24: qty 1

## 2018-01-24 MED ORDER — MAGNESIUM OXIDE 400 (241.3 MG) MG PO TABS: 800.0000 mg | ORAL_TABLET | Freq: Two times a day (BID) | ORAL | Status: DC

## 2018-01-24 MED ORDER — DIPHENHYDRAMINE HCL 50 MG/ML IJ SOLN
25.0000 mg | Freq: Once | INTRAMUSCULAR | Status: AC
  Administered 2018-01-24: 25 mg via INTRAVENOUS
  Filled 2018-01-24: qty 1

## 2018-01-24 MED ORDER — ACETAMINOPHEN 325 MG PO TABS: 650.0000 mg | ORAL_TABLET | Freq: Four times a day (QID) | ORAL | Status: DC | PRN

## 2018-01-24 MED ORDER — PANTOPRAZOLE SODIUM 40 MG PO TBEC: 40.0000 mg | DELAYED_RELEASE_TABLET | Freq: Every day | ORAL | Status: DC

## 2018-01-24 MED ORDER — POTASSIUM CHLORIDE CRYS ER 20 MEQ PO TBCR
40.0000 meq | EXTENDED_RELEASE_TABLET | Freq: Once | ORAL | Status: AC
  Administered 2018-01-24: 40 meq via ORAL
  Filled 2018-01-24: qty 2

## 2018-01-24 NOTE — Unmapped (Unsigned)
[facility]   Patient Onboarding/Medication Counseling    Mary [facility] is a 37 y.o. female with Crohn's disease who I am counseling today on initiation of therapy.    Medication: Stelara 90mg /ml injection    Verified patient's date of birth / HIPAA.      Education Provided: ??    Dose/Administration discussed: 90mg  injection SUBQ every 8 weeks (after initial infusion). This medication should be taken  without regard to food.  Stressed the importance of taking medication as prescribed and to contact provider if that changes at any time.  Discussed missed dose instructions.    Storage requirements: this medicine should be stored in the refrigerator.     Side effects / precautions discussed: Discussed common side effects, including hypersensitivity reactions (swelling, rash, hives, tightness in chest, difficulty breathing), UTI, dizziness, fatigue, skin lump or growth, flushing, chest pain, SOB, mouth sores, wound that will not heal, stomach/abdominal pain, vaginal itch/discharge, confusion, lowered alertness, vision changes, very bad headaches, seizures, cough, fever. If patient experiences any of the above, they need to call the doctor.  Patient will receive a drug information handout with shipment.    Handling precautions / disposal reviewed:  Patient will dispose of needles in a sharps container or empty laundry detergent bottle.    Drug Interactions: other medications reviewed and up to date in Epic.  No drug interactions identified.    Comorbidities/Allergies: reviewed and up to date in Epic.    Verified therapy is appropriate and should continue      Delivery Information    Medication Assistance provided: Prior Authorization    Anticipated copay of $3 reviewed with patient. Verified delivery address in FSI and reviewed medication storage requirement.    Scheduled delivery date: TBD based off of IV infusion date    Explained that we ship using UPS or courier and this shipment will not require a signature.      Explained the services we provide at [facility] and that each month we would call to set up refills.  Stressed importance of returning phone calls so that we could ensure they receive their medications in time each month.  Informed patient that we should be setting up refills 7-10 days prior to when they will run out of medication.  Informed patient that welcome packet will be sent.      Patient verbalized understanding of the above information as well as how to contact the pharmacy at [phone] option [phone] with any questions/concerns.  The pharmacy is open Monday through Friday 8:30am-4:30pm.  A pharmacist is available 24/7 via pager to answer any clinical questions they may have.        Patient Specific Needs      ? Patient has no physical, cognitive, or cultural barriers.    ? Patient prefers to have medications discussed with  Patient     ? Patient is able to read and understand education materials at a high school level or above.    ? Patient's primary language is  English           [name]  [facility] Specialty Pharmacist[facility]CH Specialty Medication Referral: PA APPROVED    Medication (Brand/Generic): [facility]    Initial FSI Test Claim completed with resulted information below:  No PA required  Patient ABLE to fill at [facility]:  [facility]  Anticipated Copay: $3  Is anticipated copay with a copay card or  grant? NO    As Co-pay is under $100 defined limit, per policy there will be no further investigation of need for financial assistance at this time unless patient requests. This referral has been communicated to the provider and handed off to the [facility] team for further processing and filling of prescribed medication.   ______________________________________________________________________  Please utilize this referral for viewing purposes as it will serve as the central location for all relevant documentation and updates.

## 2018-01-24 NOTE — Care Management Note (Signed)
Case Management Note  Patient Details  Name: [name] MRN: [id number] Date of Birth: Oct 30, 1980  Subjective/Objective:                    Action/Plan: Pt discharged home with self care. CM consulted for PCP. Pt has PCP assigned to her through Medicaid. CM spoke to the patient and she does not want to go to the assigned PCP. CM provided her with the number to [facility] as an alternative. Pt to follow up with them. Pt had transportation home.   Expected Discharge Date:  01/24/18               Expected Discharge Plan:  Home/Self Care  In-House Referral:     Discharge planning Services  CM Consult, Other - See comment(PCP)  Post Acute Care Choice:    Choice offered to:     DME Arranged:    DME Agency:     HH Arranged:    HH Agency:     Status of Service:  Completed, signed off  If discussed at [employer] of Stay Meetings, dates discussed:    Additional Comments:  Pollie Friar, RN 01/24/2018, 4:46 PM

## 2018-01-24 NOTE — Progress Notes (Signed)
Patient's controlled substance database: 12/30/2017  1  12/30/2017  Oxycodone-Acetaminophen 5-325  10 3 Ca Ros  [id number]  [place] ([id number])  0 25.00 MME Medicaid  [place]  12/27/2017  1  12/27/2017  Lorazepam 0.5 Mg Tablet  8 4 Un Pha  [id number]  Unc ([id number])  0 1.00 LME Comm Ins  [place]  12/27/2017  1  12/27/2017  Oxycodone Hcl 10 Mg Tablet  10 2 Un Pha  [id number]  Unc ([id number])  0 75.00 MME Comm Ins  [place]  12/20/2017  1  12/20/2017  Alprazolam 0.5 Mg Tablet  10 4 Co Ghe  [id number]  [place] ([id number])  0 2.50 LME Medicaid  [place]  09/28/2017  1  09/28/2017  Oxycodone-Acetaminophen 5-325  8 2 Anselm Pancoast  [id number]  [place] ([id number])  0 30.00 MME Medicaid  [place]

## 2018-01-24 NOTE — Discharge Summary (Addendum)
Physician Discharge Summary  [name] ZOX:[id number] DOB: April 23, 1981 DOA: 01/23/2018  PCP: Patient, [name]  Admit date: 01/23/2018 Discharge date: 01/24/2018  Admitted From: home Discharge disposition: home   Recommendations for Outpatient Follow-Up:   1. Adjusted Mg and K replacement 2. Cut protoninx to daily as it may interfere with Mg absorption 3. Close PCP/GI follow up 4. referral made to neuro for migraine headaches   Discharge Diagnosis:   Principal Problem:   Hypomagnesemia Active Problems:   Anxiety state   Essential hypertension   Chronic back pain   Hypokalemia   Mild renal insufficiency   Leukocytosis   Thrombocytosis (HCC)   Microcytic anemia    Discharge Condition: Improved.  Diet recommendation: Low sodium, heart healthy.  Carbohydrate-modified.  Regular.  Wound care: None.  Code status: Full.   History of Present Illness:   [name] is a 37 y.o. female with medical history significant for Crohn's disease on steroids, chronic pain, polysubstance abuse in early remission, depression with anxiety, and hypertension, now presenting to the emergency department at the direction of her gastroenterologist for evaluation of critically low magnesium on routine outpatient blood work.  Patient was following up with her gastroenterologist, routine blood work was obtained, and she was called and instructed to go to the ED for management of critically low magnesium.  Patient reports that her Crohn's has been stable and she continues to take her oral magnesium and potassium supplements at home.  She reports occasional palpitations over the past few days.  No muscle spasms or tetany.     Hospital Course by Problem:   1. Hypomagnesemia; hypokalemia  - Presents at direction of her gastroenterologist for critically low magnesium on routine outpatient labs  - Magnesium level is 0.7 on admission, potassium 2.8  - Chronically low, was not  taking Mg at home--- said her GI stopped it - repleted -dose adjusted Needs close outpatient follow up  2. Crohn's disease  - Follows [facility] at [facility]   3. Mild renal insufficiency  - SCr is 1.14 on admission, improved from recent priors  - Renally-dose medications, avoid nephrotoxins    4. Chronic pain  - No pain complaints on admission  - Continue home-regimen with prn Percocet    5. Depression, anxiety   - Stable  - Continue Cymbalta and Xanax    6. Hypertension  - BP elevated in ED  - Continue Toprol    7. Leukocytosis; thrombocytosis  - WBC is 19k and platelets 639k on admission  - Both indices similar to priors, likely secondary to steroids and reactive from Crohn's disease        Medical Consultants:      Discharge Exam:   Vitals:   01/24/18 0453 01/24/18 1358  BP: (!) 144/79 124/84  Pulse: 71 67  Resp: 17 18  Temp: 98.3 F (36.8 C) 98 F (36.7 C)  SpO2: 99% 99%   Vitals:   01/23/18 2100 01/23/18 2142 01/24/18 0453 01/24/18 1358  BP:  (!) 141/98 (!) 144/79 124/84  Pulse: 95 85 71 67  Resp:  18 17 18   Temp:  98.2 F (36.8 C) 98.3 F (36.8 C) 98 F (36.7 C)  TempSrc:    Oral  SpO2: 99% 100% 99% 99%  Weight:  63.2 kg (139 lb 5.3 oz)    Height:  5' 3"  (1.6 m)      General exam: Appears calm and comfortable.     The  results of significant diagnostics from this hospitalization (including imaging, microbiology, ancillary and laboratory) are listed below for reference.     Procedures and Diagnostic Studies:   Dg Chest 2 View  Result Date: 01/23/2018 CLINICAL DATA:  Low potassium and magnesium, nausea, vomiting, diarrhea, dizziness, history of Crohn's disease EXAM: CHEST - 2 VIEW COMPARISON:  Portable chest x-ray of 12/16/2017 FINDINGS: No active infiltrate or effusion is seen. Mediastinal and hilar contours are unremarkable. The heart is within normal limits in size. No acute bony abnormality is seen. There is very mild compression of T12  vertebral body which was present on lateral chest x-ray of 12/06/2017. IMPRESSION: 1. No active lung disease. 2. Very mild compression of T12 vertebral body which is not acute. Electronically Signed   By: Ivar Drape M.D.   On: 01/23/2018 17:13     Labs:   Basic Metabolic Panel: Recent Labs  Lab 01/23/18 1614 01/23/18 2236 01/24/18 0556  NA 141  --  138  K 2.8*  --  3.1*  CL 110  --  114*  CO2 16*  --  16*  GLUCOSE 120*  --  96  BUN 21*  --  20  CREATININE 1.14*  --  0.96  CALCIUM 8.3*  --  7.4*  MG 0.7*  --  2.5*  PHOS  --  4.5  --    GFR Estimated Creatinine Clearance: 72.5 mL/min (by C-G formula based on SCr of 0.96 mg/dL). Liver Function Tests: Recent Labs  Lab 01/24/18 0556  AST 13*  ALT 20  ALKPHOS 75  BILITOT 0.4  PROT 5.8*  ALBUMIN 3.4*   No results for input(s): LIPASE, AMYLASE in the last 168 hours. No results for input(s): AMMONIA in the last 168 hours. Coagulation profile No results for input(s): INR, PROTIME in the last 168 hours.  CBC: Recent Labs  Lab 01/23/18 1614 01/24/18 0556  WBC 19.2* 17.0*  HGB 10.1* 8.3*  HCT 36.4 29.3*  MCV 77.6* 76.5*  PLT 639* 565*   Cardiac Enzymes: No results for input(s): CKTOTAL, CKMB, CKMBINDEX, TROPONINI in the last 168 hours. BNP: Invalid input(s): POCBNP CBG: No results for input(s): GLUCAP in the last 168 hours. D-Dimer No results for input(s): DDIMER in the last 72 hours. Hgb A1c No results for input(s): HGBA1C in the last 72 hours. Lipid Profile No results for input(s): CHOL, HDL, LDLCALC, TRIG, CHOLHDL, LDLDIRECT in the last 72 hours. Thyroid function studies No results for input(s): TSH, T4TOTAL, T3FREE, THYROIDAB in the last 72 hours.  Invalid input(s): FREET3 Anemia work up No results for input(s): VITAMINB12, FOLATE, FERRITIN, TIBC, IRON, RETICCTPCT in the last 72 hours. Microbiology No results found for this or any previous visit (from the past 240 hour(s)).   Discharge Instructions:     Discharge Instructions    Ambulatory referral to Neurology   Complete by:  As directed    An appointment is requested in approximately: 2-4 weeks for migraines   Diet general   Complete by:  As directed    Discharge instructions   Complete by:  As directed    Have decreased protonix for now as that can interfere with magnesium absorption Also, have increased magnesium as recommended by [facility] on 6/14 Need appointment next week for recheck of Mg and BMP for potassium I have made a referral for neurology for your migraines-- try to keep a headache diary   Increase activity slowly   Complete by:  As directed      Allergies  as of 01/24/2018      Reactions   Penicillins Hives   Has patient had a PCN reaction causing immediate rash, facial/tongue/throat swelling, SOB or lightheadedness with hypotension: Yes Has patient had a PCN reaction causing severe rash involving mucus membranes or skin necrosis: Unk Has patient had a PCN reaction that required hospitalization: Yes Has patient had a PCN reaction occurring within the last 10 years: No If all of the above answers are "NO", then may proceed with Cephalosporin use.   Doxycycline Other (See Comments)   Reaction not recalled      Medication List    STOP taking these medications   ALPRAZolam 0.5 MG tablet Commonly known as:  XANAX   aspirin EC 325 MG tablet   magnesium oxide 400 MG tablet Commonly known as:  MAG-OX Replaced by:  magnesium oxide 400 (241.3 Mg) MG tablet   naproxen sodium 220 MG tablet Commonly known as:  ALEVE   oxyCODONE-acetaminophen 5-325 MG tablet Commonly known as:  PERCOCET/ROXICET     TAKE these medications   acetaminophen 325 MG tablet Commonly known as:  TYLENOL Take 2 tablets (650 mg total) by mouth every 6 (six) hours as needed for mild pain (or Fever >/= 101).   budesonide 3 MG 24 hr capsule Commonly known as:  ENTOCORT EC Take 3 capsules (9 mg total) by mouth daily. What changed:    how  much to take  when to take this   DULoxetine 20 MG capsule Commonly known as:  CYMBALTA Take 20 mg by mouth daily.   magnesium oxide 400 (241.3 Mg) MG tablet Commonly known as:  MAG-OX Take 2 tablets (800 mg total) by mouth 2 (two) times daily. Replaces:  magnesium oxide 400 MG tablet   metoprolol succinate 50 MG 24 hr tablet Commonly known as:  TOPROL-XL Take 1 tablet (50 mg total) by mouth daily. Take with or immediately following a meal.   pantoprazole 40 MG tablet Commonly known as:  PROTONIX Take 1 tablet (40 mg total) by mouth daily. What changed:  when to take this   potassium chloride SA 20 MEQ tablet Commonly known as:  K-DUR,KLOR-CON Take 1 tablet (20 mEq total) by mouth 2 (two) times daily.      Follow-up Information    need to establish with a PCP (should be listed on your medicaid card) Follow up.        schedule follow up with [facility] for follow up labs-Mg and BMP in 1 week Follow up.            Time coordinating discharge: 35 min  Signed:  Geradine Girt  [facility] 01/24/2018, 2:55 PM

## 2018-01-24 NOTE — Progress Notes (Signed)
Discharge instructions reviewed with the patient to include new and changed medications, follow up appointments, Referrals and follow up lab work. Discussed the importance of finding a PCP.   Patient voices understanding to teaching.  Printed copies given to the patient as well as her prescriptions.  Ambulatory to door.  Home via POV.  Patient driving herself home.

## 2018-01-24 NOTE — Progress Notes (Signed)
Overnight patient exhausted all her PRN pain medication c/o headache and general body aches. Patient also given ice. Offered nicotine patch but she refused.

## 2018-01-25 LAB — TISSUE TRANSGLUTAMINASE ANTIBODY, IGA: Lab: 0.3

## 2018-01-25 LAB — TISSUE TRANSGLUTAMINASE, IGA
TISSUE TRANSGLUTAMINASE ANTIBODY, IGA: 0.3 U/mL (ref <7)
TTG INTERPRETATION: NEGATIVE

## 2018-01-25 MED ORDER — EMPTY CONTAINER
2 refills | 0 days
Start: 2018-01-25 — End: 2018-04-05

## 2018-01-31 MED ORDER — ERGOCALCIFEROL (VITAMIN D2) 1,250 MCG (50,000 UNIT) CAPSULE: 50000 [IU] | capsule | 0 refills | 0 days | Status: AC

## 2018-01-31 MED ORDER — ERGOCALCIFEROL (VITAMIN D2) 1,250 MCG (50,000 UNIT) CAPSULE
ORAL_CAPSULE | ORAL | 0 refills | 0.00000 days | Status: CP
Start: 2018-01-31 — End: 2018-01-31

## 2018-01-31 NOTE — Unmapped (Signed)
Called pt as she has not scheduled feraheme/stelara infusion as discussed on 6/17th.   VM left with [facility] phone number to call and make appointments for feraheme and stelara infusion

## 2018-02-01 NOTE — Unmapped (Signed)
Pt mom Chalice Philbert left VM for me stating she thoght they were not suppose to schedule stelara and feraheme infusions until after app with Dr. [name] on 7/23 even though I spoke extensively to [name] over a week ago to schedule infusions now.   Left VM for stacy Clum (ph. [phone]) with all the infusion information and timing of feraheme and stelara 1x infusion to be done before her app with Dr. [name] in July. Phone number to [facility] given and RN number provided if she has further questions.

## 2018-02-01 NOTE — Unmapped (Signed)
I called patient's phones to follow up on why she had not yet scheduled IV iron and Stelara infusions but no answer. I then called her mother's cell phone and left a voicemail as patient previously gave permission to discuss her care with her mother. I asked her mother to call Sherrilyn Rist when she received my message to help schedule infusions. I also told her mother that I sent a Rx for vitamin D to her pharmacy given low vitamin D levels.    Mardee Postin, M.D.  Gastroenterology Fellow

## 2018-02-16 ENCOUNTER — Ambulatory Visit: Admit: 2018-02-16 | Discharge: 2018-02-17 | Payer: MEDICAID

## 2018-02-16 DIAGNOSIS — D509 Iron deficiency anemia, unspecified: Secondary | ICD-10-CM

## 2018-02-16 DIAGNOSIS — K50919 Crohn's disease, unspecified, with unspecified complications: Principal | ICD-10-CM

## 2018-02-16 NOTE — Unmapped (Signed)
Feraheme /Ferumoxytol Discharge Instructions    You may return to your normal diet, medications, and activities after receiving Feraheme.    What is ferumoxytol (Feraheme)?  Ferumoxytol is a type of iron. You normally get iron from the foods you eat. In your body, iron becomes a part of your hemoglobin (HEEM o glo bin) and myoglobin (MY o glo bin). Hemoglobin carries oxygen through your blood to tissues and organs. Myoglobin helps your muscle cells store oxygen.  Ferumoxytol is used to treat iron deficiency anemia in people with chronic kidney disease. Anemia is a lack of red blood cells caused by having too little iron in the body.  Ferumoxytol may also be used for purposes not listed in this medication guide.    What are the possible side effects of ferumoxytol (Feraheme)?  Get emergency medical help if you have any of these signs of an allergic reaction: hives; wheezing or difficult breathing; swelling of your face, lips, tongue, or throat.    Watch for signs of allergic reaction for at least 30 minutes after your injection.    Call your doctor at once if you have serious side effects such as:  ? feeling like you might pass out;  ? slow heart rate, weak pulse, fainting, slow breathing (breathing may stop);  ? easy bruising;  ? swelling where the medicine was injected; or  ? worsening symptoms of kidney failure (urinating less than usual or not at all, confusion, mood changes, increased thirst, loss of appetite, swelling, weight gain, feeling short of breath).    Drink water and other fluids today.     Less serious side effects may include:  ? nausea, vomiting, stomach pain;  ? diarrhea, constipation;  ? headache, dizziness;  ? swelling in your hands or feet;  ? chest pain; or  ? cough.    Ferumoxytol can cause unusual results with magnetic resonance imaging (MRI) tests for up to 3 months after you receive this medication. Tell any doctor who treats you that you have received a ferumoxytol injection within the past 3 months.  Ferumoxytol will not affect other types of X-rays, CT scans, ultrasounds, or nuclear radiation imaging.    If the IV site is painful, place a cold compress on the site for 10-15 minutes 4 times a day until the soreness is gone. Notify your doctor if the site becomes red and hot to the touch.    If you should have any questions for the [facility] staff at [facility], please contact us at:    [phone] Mary Hawkins     or       [phone] Nurses??? Station    It has been a privilege to serve you today. Thank you for choosing [facility]: [facility] at [facility] for your care.     Mary Hawkins, Mary Hawkins, and Mary Hawkins          Stelara Discharge Instructions        What is the most important information I should know about STELARA?    Mary Hawkins is a medicine that affects your immune system. STELARA can increase your risk of having serious side effects, including:   Serious infections: STELARA may lower the ability of your immune system to  fight infections and may increase your risk of infections.   You should not start taking STELARA if you have any kind of infection unless your doctor says it is okay.    Before starting STELARA, tell your doctor if you:  ???  think you have an infection or have symptoms of an infection such as:    fever, sweat, or chills, weight loss,  muscle aches, warm, red, or painful skin or sores on your body,  Cough, diarrhea or stomach pain, shortness of breath, burning when you urinate or urinate more often than normal,  blood in phlegm,  feel very tired  ??? are being treated for an infection.    After starting [facility], call your doctor right away if you have any symptoms of an infection (see above).     STELARA?? is the only biologic that works by blocking two proteins, IL-12 and IL-23, which may play a role in psoriatic arthritis. By blocking these proteins, STELARA?? may help reduce the inflammation that causes both the joint and skin symptoms. Reversible Posterior Leukoencephalopathy Syndrome (RPLS):      RPLS is a rare condition that affects the brain and can cause death. The cause of RPLS is not known. If RPLS is found early and treated, most people recover. Tell your doctor right away if you have any new or worsening medical problems including: headache, confusion, seizures, vision problems    What is STELARA?  STELARA is a prescription medicine used to treat adults 18 years and older with:  ???  moderate or severe psoriasis that involves large areas or many areas of their  body, who may benefit from taking injections or pills (systemic therapy) or  phototherapy (treatment using ultraviolet light alone or with pills).  ???  active psoriatic arthritis. STELARA can be used alone or with methotrexate.  ???  moderately to severely active Crohn???s disease in people who have already taken  other medicine that did not work well enough or they could not tolerate it.    STELARA may improve your psoriasis, psoriatic arthritis or Crohn???s disease, but may also lower the ability of your immune system to  fight infections. Taking STELARA may also increase your risk for certain types of cancer.  It is not known if STELARA is safe and effective in children.    ???  have recently received or are scheduled to receive an immunization (vaccine).  People who take STELARA should not receive live vaccines. Tell your doctor if  anyone in your house needs a vaccine. The viruses used in some types of  vaccines can spread to people with a weakened immune system, and can cause  serious problems. You should not receive the BCG vaccine during the   one year before taking STELARA or one year after you stop taking STELARA.  ???  have any new or changing lesions within psoriasis areas or on normal skin.  ???  are receiving or have received allergy shots, especially for serious allergic  reactions. Allergy shots may not work as well for you during treatment with  STELARA. STELARA may also increase your risk of having an allergic reaction  to an allergy shot.  ???  receive or have received phototherapy for your psoriasis.  ???  have any other medical conditions.  ???  are pregnant or planning to become pregnant.      Adults with Crohn???s disease will receive the first dose of STELARA through a vein in the arm (intravenous infusion) in a healthcare facility by a healthcare provider. It takes at least 1 hour to receive the full dose of medicine. You will then receive STELARA as an  injection under the skin (subcutaneous injection) 8 weeks after the first dose of STELARA.Marland Kitchen    ??? Adults  with psoriasis or psoriatic arthritis will receive STELARA as an injection under the skin (subcutaneous injection).    What are the possible side effects of STELARA?  Call your doctor right away if you have any of the following symptoms of a serious allergic reaction:  ??? feeling faint ??? chest tightness  ??? swelling of your face, eyelids, tongue, or throat ??? skin rash    Common side effects of STELARA include:  ??? upper respiratory infections ??? vomiting  ??? headache ??? vaginal yeast infections  ??? tiredness ??? urinary tract infections  ??? itching ??? redness at the injection site      If the IV site is painful, place a cold compress on the site for 10-15 minutes 4 times a day until the soreness is gone. Notify your doctor if the site becomes red and hot to the touch.    If you should have any questions for the Infusion staff, please contact us at:   [phone] Mary Hawkins or [phone] Nurses??? Station    It has been a privilege to serve you today. Thank you for choosing [facility] at [facility] for your care.     Mary Hawkins, Mary Hawkins, and Mary Hawkins

## 2018-02-16 NOTE — Unmapped (Signed)
1610  Patient presents for Feraheme infusion.  Reports no new medical hx since last infusion.  Vitals stable.  IV started.  NS hung at [facility].  0850  Feraheme 510 mg  Dose 1/2 started to infuse at 539ml/hr.    9604  Feraheme infusion complete.  Patient without any s/s of adverse reaction.  Vital stable.  NS flush started.  0935  NS flush complete.  Vitals stable.  IV d/c'd per protocol.        0935  Stelara 390 mg infusing at 236ml/hr.  1035  Stelara infusion complete.  Patient tolerated without complication.  NS flush started.    1100 NS flush complete.  IV d/c'd.  Reviewed AVS with pt and mom about any reactions with Feraheme and/or Stelara. Mom stated that she wanted to clarify with GI MD if the 2nd dose Feraheme was to be given here at this location or at the GI clinic before scheduling here. Patient discharged without complication.

## 2018-02-27 ENCOUNTER — Ambulatory Visit: Admit: 2018-02-27 | Discharge: 2018-02-28 | Payer: MEDICAID | Attending: Internal Medicine | Primary: Internal Medicine

## 2018-02-27 DIAGNOSIS — Z720 Tobacco use: Secondary | ICD-10-CM

## 2018-02-27 DIAGNOSIS — E538 Deficiency of other specified B group vitamins: Secondary | ICD-10-CM

## 2018-02-27 DIAGNOSIS — K50919 Crohn's disease, unspecified, with unspecified complications: Principal | ICD-10-CM

## 2018-02-27 DIAGNOSIS — M797 Fibromyalgia: Secondary | ICD-10-CM

## 2018-02-27 DIAGNOSIS — K2210 Ulcer of esophagus without bleeding: Secondary | ICD-10-CM

## 2018-02-27 LAB — COMPREHENSIVE METABOLIC PANEL
ALBUMIN: 3.8 g/dL (ref 3.5–5.0)
ALKALINE PHOSPHATASE: 87 U/L (ref 38–126)
ALT (SGPT): 14 U/L — ABNORMAL LOW (ref 15–48)
ANION GAP: 9 mmol/L (ref 9–15)
AST (SGOT): 15 U/L (ref 14–38)
BILIRUBIN TOTAL: 0.5 mg/dL (ref 0.0–1.2)
BLOOD UREA NITROGEN: 16 mg/dL (ref 7–21)
BUN / CREAT RATIO: 14
CHLORIDE: 111 mmol/L — ABNORMAL HIGH (ref 98–107)
CO2: 19 mmol/L — ABNORMAL LOW (ref 22.0–30.0)
CREATININE: 1.12 mg/dL — ABNORMAL HIGH (ref 0.60–1.00)
EGFR CKD-EPI AA FEMALE: 73 mL/min/{1.73_m2} (ref >=60)
EGFR CKD-EPI NON-AA FEMALE: 63 mL/min/{1.73_m2} (ref >=60)
GLUCOSE RANDOM: 51 mg/dL — ABNORMAL LOW (ref 65–179)
PROTEIN TOTAL: 6.5 g/dL (ref 6.5–8.3)
SODIUM: 139 mmol/L (ref 135–145)

## 2018-02-27 LAB — CBC
HEMATOCRIT: 36.4 % (ref 36.0–46.0)
MEAN CORPUSCULAR HEMOGLOBIN CONC: 28.3 g/dL — ABNORMAL LOW (ref 31.0–37.0)
MEAN CORPUSCULAR HEMOGLOBIN: 22.4 pg — ABNORMAL LOW (ref 26.0–34.0)
MEAN CORPUSCULAR VOLUME: 79.2 fL — ABNORMAL LOW (ref 80.0–100.0)
MEAN PLATELET VOLUME: 7.1 fL (ref 7.0–10.0)
PLATELET COUNT: 595 10*9/L — ABNORMAL HIGH (ref 150–440)
RED BLOOD CELL COUNT: 4.59 10*12/L (ref 4.00–5.20)
RED CELL DISTRIBUTION WIDTH: 25.8 % — ABNORMAL HIGH (ref 12.0–15.0)
WBC ADJUSTED: 13.8 10*9/L — ABNORMAL HIGH (ref 4.5–11.0)

## 2018-02-27 LAB — HEMOGLOBIN: Lab: 10.3 — ABNORMAL LOW

## 2018-02-27 LAB — MAGNESIUM
MAGNESIUM: 0.9 mg/dL — CL (ref 1.6–2.2)
Magnesium:MCnc:Pt:Ser/Plas:Qn:: 0.9 — CL

## 2018-02-27 LAB — BUN / CREAT RATIO: Urea nitrogen/Creatinine:MRto:Pt:Ser/Plas:Qn:: 14

## 2018-02-27 LAB — C-REACTIVE PROTEIN: C reactive protein:MCnc:Pt:Ser/Plas:Qn:: <5

## 2018-02-27 MED ORDER — ERGOCALCIFEROL (VITAMIN D2) 1,250 MCG (50,000 UNIT) CAPSULE
ORAL_CAPSULE | ORAL | 0 refills | 0.00000 days | Status: CP
Start: 2018-02-27 — End: 2018-02-27

## 2018-02-27 MED ORDER — CYANOCOBALAMIN (VIT B-12) 1,000 MCG/ML INJECTION SOLUTION: 1 mL | mL | 2 refills | 0 days | Status: AC

## 2018-02-27 MED ORDER — ERGOCALCIFEROL (VITAMIN D2) 1,250 MCG (50,000 UNIT) CAPSULE: 50000 [IU] | capsule | 0 refills | 0 days | Status: AC

## 2018-02-27 MED ORDER — DULOXETINE 30 MG CAPSULE,DELAYED RELEASE
ORAL_CAPSULE | 3 refills | 0 days | Status: CP
Start: 2018-02-27 — End: 2018-08-14

## 2018-02-27 MED ORDER — PANTOPRAZOLE 40 MG TABLET,DELAYED RELEASE
ORAL_TABLET | Freq: Two times a day (BID) | ORAL | 2 refills | 0.00000 days | Status: CP
Start: 2018-02-27 — End: 2018-05-28

## 2018-02-27 MED ORDER — DULOXETINE 30 MG CAPSULE,DELAYED RELEASE: capsule | 3 refills | 0 days | Status: AC

## 2018-02-27 MED ORDER — PANTOPRAZOLE 40 MG TABLET,DELAYED RELEASE: 40 mg | tablet | Freq: Two times a day (BID) | 2 refills | 0 days | Status: AC

## 2018-02-27 MED ORDER — ONDANSETRON HCL 4 MG TABLET
ORAL_TABLET | Freq: Three times a day (TID) | ORAL | 0 refills | 0 days | Status: CP | PRN
Start: 2018-02-27 — End: 2018-03-30

## 2018-02-27 MED ORDER — CYANOCOBALAMIN (VIT B-12) 1,000 MCG/ML INJECTION SOLUTION
SUBCUTANEOUS | 2 refills | 0.00000 days | Status: CP
Start: 2018-02-27 — End: 2018-10-10

## 2018-02-27 MED ORDER — SYRINGE WITH NEEDLE, SAFETY 3 ML 25 GAUGE X 5/8": 1 | Syringe | 0 refills | 0 days | Status: AC

## 2018-02-27 MED ORDER — COLESTIPOL 1 GRAM TABLET
ORAL_TABLET | Freq: Two times a day (BID) | ORAL | 3 refills | 0 days | Status: CP
Start: 2018-02-27 — End: 2018-08-14

## 2018-02-27 MED ORDER — SYRINGE WITH NEEDLE, SAFETY 3 ML 25 GAUGE X 5/8"
INJECTION | 0 refills | 0.00000 days | Status: CP
Start: 2018-02-27 — End: 2018-02-27

## 2018-02-27 NOTE — Unmapped (Addendum)
INSTRUCTIONS:     1. Continue Stelara every 8 weeks. You should be hearing from our pharmacy about a shipment of the injections soon.  2. To slow down and bulk up your bowel movements, recommend start Colestid 1 g twice daily. Separate this from your other medicines by about 2 hours because it can bind them up.  3. Start Cymbalta 30 mg daily x 1 week, then increase to 60 mg daily thereafter. This should help with your all over body pain and fibromyalgia.  4. Continue Protonix 40 mg twice daily  5. Vitamin B12 injection today. Also sent supplies to your pharmacy for you to perform injections at home of vitamin B12 once a month.  6. Please call the infusion center to schedule your last IV iron infusion. We can then check labs in a few months to see if you need more iron or if this has helped.  7. Start vitamin D 50,000 units (prescription strength) weekly. I sent this prescription to your pharmacy. After you complete this high dose vitamin D, then you should take 2000 IU vitamin D per day as maintenance therapy.   8. You need to stop smoking - this is the most important thing for management of your Crohn's disease  9. If any trouble or symptoms, do not hesitate to call.     Clinical Nurse Contact:  Neta Mends, RN  Ph#  [phone]  Fax#  [phone]    For clinic appointments, please call, [phone], option 1.  For questions regarding radiology appointments, or to schedule, [phone].  For questions regarding scheduling GI procedures (e.g,. Colonoscopy), please call, [phone], option 2.    For educational material and resources:  [email or web address]    ============================================

## 2018-02-27 NOTE — Unmapped (Signed)
Spoke with pt regarding stelara SQ injections. Demonstrated SQ injections with stelara trainer, pt demonstrated as well, verbalized understanding. Discussed how to store medication and when to call MD office.   Pt verbalized understanding of all information. Also gave pt [facility] paramacy phone number to order medication.

## 2018-02-27 NOTE — Unmapped (Addendum)
GASTROENTEROLOGY  FOLLOW UP NOTE - INFLAMMATORY BOWEL DISEASE  02/27/2018    Demographics:  Mary Hawkins is a 37 y.o. female with Crohn's disease    Referring physician:   [facility]          HPI / NOTE :     Today, I saw Mary Hawkins for follow up consultation in the [facility] at the request of [facility] regarding Crohn's Disease.  Outside records including available clinical notes, endoscopy reports, imaging results and pathology results were reviewed in detail as part of this follow up consultation.     HPI:  This is a 37 year old female with history of Crohn's disease who presents in follow up. She is accompanied by her mother as well as her son today.  At her last visit on 01/19/18, we discussed various options for management of her Crohn's disease and decision was made to initiate therapy with ustekinumab as had infectious complications from anti-TNFs in the past and was complaining of severe arthralgias. At the time of her last visit, she was found to have Mg 0.5, K 3.3. We encouraged her to go to ED on multiple occasions for IV repletion of Mg. She initially refused to go and then says she eventually went to the ED and they kept her overnight. She is now taking 800 mg mag oxide per day.     She had not yet scheduled ustekinumab or IV iron infusions when I called to follow up with her on 02/01/18. My nurse Sherrilyn Rist had spoken extensively with patient a week prior letting her know that insurance had approved ustekinumab and the need to schedule infusions. She finally received loading dose of IV ustekinumab 02/16/18, also received 1st dose of Feraheme 02/16/18. She did not schedule her second IV iron infusion despite being urged to do so at the clinic. She says she wants this to happen closer to home.    She is now having about 8-10 BMs per day. Sometimes she has days when she has 3 BMs per day. BMs are not quite as loose as what they were. Not seeing any blood. Having urgency but no incontinence. She is having several nocturnal BMs as well. She was supposed to be on Entocort 9 mg daily until starting Stelara and then we had planned to taper this medication. She self-discontinued Entocort 02/03/18 because she was gaining weight. Weight has decreased slightly since that time. She isn't sure that it made a big difference with regards to her diarrhea.    She is now having worsening diffuse abdominal pain as well as body pains all over. She thinks this may be worse after getting the IV loading dose of Stelara. Pains are all over in her joints, back, neck. Says she has been diagnosed with fibromyalgia in the past. Asking for a medicine for this because can't wait for the Stelara to work.     She denies any dysphagia or odynophagia. Remains on BID PPI. She continues to smoke and is now smoking 1 ppd. I referred her to tobacco cessation counselor at her last visit but she says she never called them back to schedule appointment.     Review of Systems: positive as per HPI   Otherwise, the balance of 10 systems is negative.          IBD HISTORY:     Year of disease onset:  2010  Diagnosis:  Crohn's Disease  Age at onset:  80-40 yr old (A2)  Location:  Ileocolonic (L3)  Behavior:  Stricturing (B2)  Perianal Dz:  No    Brief IBD Disease Course:    2010 diagnosed with stricturing ileal Crohn's disease, treated 8 months with prednisone. 02/2010 start of Humira. 03/2011 stop Humira due to recurrent skin infections, started Entocort, issues with rectal prolapse and found to have ileal stricture. 11/13/13 ileocecal resection with Dr. Epifania Gore (28 cm of ileum resected, 5 cm colon resected - path c/w Crohn's disease but no granulomas), also with incidental Meckel's diverticulum noted and resected, mesh rectopexy for rectal prolapse. 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 03/18/14 discussed Remicade/Imuran but did not start these medications. 5/1-5/15/19 admission to [facility] for ARDS 2/2 rhinovirus requiring mechanical ventilation, AKI. 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 02/16/18 IV loading dose of ustekinumab followed by ustekinumab 90 mg [place] every 8 weeks.    Endoscopy:      Colon 12/27/17: Mild inflammation and a few ulcerations at ileocolonic anastomosis. Rutgeert's i2. Localized mildly granular mucosa in the sigmoid colon.  - path: Small bowel - patchy crypt architectural distortion with Paneth cell hyperplasia, consistent with mild chronic active enteritis, negative for dysplasia, no granulomas. Colon - unremarkable colonic mucosa.   EGD 12/27/17: Grade D esophagitis, small hiatal hernia, otherwise normal stomach and duodenum.   - path: ulceration with inflamed granulation tissue, no granulomas  Colon 02/20/14: Multiple discrete ulcerations seen corresponding to Rutgeert's i2.   EGD 11/12/13: Normal esophagus. Markedly thickened gastric folds without inflammation seen. Normal duodenum.     Imaging:    CT A/P 12/24/17: Mild wall thickening of the neoterminal ileum, possibly mild ileus. Right lower quadrant adenopathy, probably reactive.    Prior IBD medications (type, dose, duration, response):  - prednisone  - Entocort  - adalimumab 2011-2012, good response but developed recurrent skin infections prompting discontinuation  - ustekinumab 02/2018 -             Past Medical History:   Past medical history:   Past Medical History:   Diagnosis Date   ??? Anxiety    ??? Arthritis    ??? Back pain    ??? Crohn disease (CMS-HCC)    ??? Eczema    ??? Endometriosis    ??? GERD (gastroesophageal reflux disease)    ??? Headache(784.0)    ??? Hypertension    ??? Ulcerative colitis (CMS-HCC)      Past surgical history:   Past Surgical History:   Procedure Laterality Date   ??? BREAST RECONSTRUCTION  2004   ??? ENDOMETRIAL ABLATION     ??? PR COLONOSCOPY FLX DX W/COLLJ SPEC WHEN PFRMD N/A 11/12/2013    Procedure: COLONOSCOPY, FLEXIBLE, PROXIMAL TO SPLENIC FLEXURE; DIAGNOSTIC, W/WO COLLECTION SPECIMEN BY BRUSH OR WASH;  Surgeon: Teodoro Spray, MD;  Location: [facility];  Service: Gastroenterology   ??? PR COLONOSCOPY FLX DX W/COLLJ SPEC WHEN PFRMD N/A 02/20/2014    Procedure: COLONOSCOPY, FLEXIBLE, PROXIMAL TO SPLENIC FLEXURE; DIAGNOSTIC, W/WO COLLECTION SPECIMEN BY BRUSH OR WASH;  Surgeon: Billie Ruddy, MD;  Location: GI PROC[facility]URES [facility];  Service: Gastroenterology   ??? PR COLONOSCOPY FLX DX W/COLLJ SPEC WHEN PFRMD N/A 12/27/2017    Procedure: COLONOSCOPY, FLEXIBLE, PROXIMAL TO SPLENIC FLEXURE; DIAGNOSTIC, W/WO COLLECTION SPECIMEN BY BRUSH OR WASH;  Surgeon: Bronson Curb, MD;  Location: GI [facility]URES [facility];  Service: Gastroenterology   ??? PR REMVL COLON & TERM ILEUM W/ILEOCOLOSTOMY N/A 11/13/2013    Procedure: Ileocecectomy,  meckel's diverticulectomy, mesh rectopexy;  Surgeon: Emilia Beck, MD;  Location: MAIN OR [place];  Service: Gastrointestinal   ??? PR UPPER GI ENDOSCOPY,BIOPSY N/A 11/12/2013    Procedure: UGI ENDOSCOPY; WITH BIOPSY, SINGLE OR MULTIPLE;  Surgeon: Teodoro Spray, MD;  [facility]: [facility];  Service: Gastroenterology   ??? PR UPPER GI ENDOSCOPY,BIOPSY N/A 12/27/2017    Procedure: UGI ENDOSCOPY; WITH BIOPSY, SINGLE OR MULTIPLE;  Surgeon: Bronson Curb, MD;  Location: [facility];  Service: Gastroenterology   ??? RECTAL PROLAPSE REPAIR       Family history:   Family History   Problem Relation Age of Onset   ??? Esophageal cancer Maternal Grandfather    ??? Cancer Maternal Grandfather         Gastric cancer     Social history: Single - has son. She is currently smoking 1 ppd - she had quit in the hospital but started again. Occasional alcohol use. No illicit drug use.   Social History     Socioeconomic History   ??? Marital status: Single     Spouse name: None   ??? Number of children: None   ??? Years of education: None   ??? Highest education level: None   Occupational History   ??? None   Social Needs   ??? Financial resource strain: None   ??? Food insecurity:     Worry: None     Inability: None   ??? Transportation needs:     Medical: None     Non-medical: None   Tobacco Use   ??? Smoking status: Current Every Day Smoker     Packs/day: 1.00     Types: Cigarettes     Last attempt to quit: 12/06/2017     Years since quitting: 0.2   ??? Smokeless tobacco: Never Used   Substance and Sexual Activity   ??? Alcohol use: Yes     Comment: socially   ??? Drug use: No   ??? Sexual activity: None   Lifestyle   ??? Physical activity:     Days per week: None     Minutes per session: None   ??? Stress: None   Relationships   ??? Social connections:     Talks on phone: None     Gets together: None     Attends religious service: None     Active member of club or organization: None     Attends meetings of clubs or organizations: None     Relationship status: None   Other Topics Concern   ??? None   Social History Narrative    Currently unemployed.  She is a 34-year-old son which takes care of.             Allergies:     Allergies   Allergen Reactions   ??? Penicillins Hives     Has patient had a PCN reaction causing immediate rash, facial/tongue/throat swelling, SOB or lightheadedness with hypotension: Yes  Has patient had a PCN reaction causing severe rash involving mucus membranes or skin necrosis: Unk  Has patient had a PCN reaction that required hospitalization: Yes  Has patient had a PCN reaction occurring within the last 10 years: No  If all of the above answers are NO, then may proceed with Cephalosporin use.  Has patient had a PCN reaction causing immediate rash, facial/tongue/throat swelling, SOB or lightheadedness with hypotension: Yes  Has patient had a PCN reaction causing severe rash involving mucus membranes  or skin necrosis: Unk  Has patient had a PCN reaction that required hospitalization: Yes  Has patient had a PCN reaction occurring within the last 10 years: No  If all of the above answers are NO, then may proceed with Cephalosporin use.   ??? Doxycycline Hcl Other (See Comments) Reaction not recalled             Medications:     Current Outpatient Medications   Medication Sig Dispense Refill   ??? magnesium oxide (MAG-OX) 400 mg (241.3 mg magnesium) tablet Take 1 tablet by mouth Two (2) times a day.  1   ??? metoprolol succinate (TOPROL-XL) 50 MG 24 hr tablet Take 50 mg by mouth daily.     ??? multivitamin therapeutic with minerals (THERA-M) 27-0.4 mg Tab Take 1 tablet by mouth daily. With iron and calcium     ??? pantoprazole (PROTONIX) 40 MG tablet Take 1 tablet (40 mg total) by mouth Two (2) times a day (30 minutes before a meal). 60 tablet 2   ??? potassium chloride SA (K-DUR,KLOR-CON) 20 MEQ tablet Take 20 mEq by mouth daily.     ??? ustekinumab (STELARA) 90 mg/mL Syrg syringe Inject 1 mL (90 mg total) under the skin every 8 weeks. 1 mL 0   ??? budesonide (ENTOCORT EC) 3 mg 24 hr capsule Take 3 capsules (9 mg total) by mouth daily. (Patient not taking: Reported on 02/27/2018) 90 capsule 1   ??? DULoxetine (CYMBALTA) 20 MG capsule Take 1 capsule (20 mg total) by mouth daily. 30 capsule 0   ??? ergocalciferol (DRISDOL) 50,000 unit capsule Take 1 capsule (50,000 Units total) by mouth once a week. 8 capsule 0     No current facility-administered medications for this visit.              Physical Exam:   BP 120/84  - Pulse 108  - Temp 37.2 ??C (Temporal)  - Wt 63.9 kg (140 lb 12.8 oz)  - BMI 24.95 kg/m??     GEN: sitting in chair in NAD, appears comfortable on exam  HEENT: OP clear with no erythema, lesions, exudate, mucous membranes moist  NEURO:  gait normal, non-focal, no obvious neurologic abnormality  LUNGS: CTAB, no wheezes, rales, or rhonchi  CV: S1/S2, RRR, no murmurs  ABD: Soft, non-distended, non-tender, normoactive bowel sounds  Extremities: no cyanosis, clubbing or edema, normal gait  Psych: flat affect, very withdrawn throughout part of interview, A&O x3  SKIN: no visible lesions on face, neck, arms, abdomen  PERIANAL / RECTAL EXAM:   Deferred           Labs, Data & Indices:     Lab Review:   Lab Results   Component Value Date    WBC 17.1 (H) 01/19/2018    WBC 8.6 02/20/2014    RBC 3.83 (L) 01/19/2018    RBC 4.09 02/20/2014    HGB 8.6 (L) 01/19/2018    HGB 11.6 (L) 02/20/2014     Lab Results   Component Value Date    AST 12 (L) 01/19/2018    AST 21 02/18/2014    ALT 17 01/19/2018    ALT 15 02/18/2014    BUN 17 01/19/2018    BUN 8 03/18/2014    CREATININE 0.88 01/19/2018    CREATININE 0.86 03/18/2014    CO2 20.0 (L) 01/19/2018    CO2 27 03/18/2014    ALBUMIN 4.1 01/19/2018    ALBUMIN 3.9 03/18/2014    CALCIUM 7.8 (L)  01/19/2018    CALCIUM 7.3 (L) 03/18/2014     Lab Results   Component Value Date    TSH 3.433 (H) 01/19/2018    TSH 4.50 (H) 03/18/2014      HBsAg neg   HBcAb total neg  HBsAb neg  Quantiferon gold neg  TPMT normal  ............................................................................................................................................Marland Kitchen          Assessment & Recommendations:     Lelania Bia is a 37 y.o. female with history of stricturing??ileal Crohn's disease??s/p ileocecal resection, mesh retropexy for rectal prolapse, incidental Meckel's diverticulum s/p resection 11/13/13 who presents to establish care after recent hospitalization. She has known active Crohn's disease with Rutgeert's i2 anastomosis. At her last visit, we obtained insurance authorization for use of ustekinumab. We left multiple voicemails for the patient instructing her to schedule her IV ustekinumab infusion but unfortunately this did not occur until 02/16/18. She is doing a little worse today than her last likely from a combination of delaying the start of ustekinumab, self-discontinuing her Entocort, and continuing to smoke cigarettes. She does not wish to restart any corticosteroids while we wait to see effects of ustekinumab. She also has severe electrolyte disturbances including continued hypomagnesemia, iron deficiency with ferritin 2.5, iron % sat 2, vitamin D deficiency with vitamin D 15.6. In order to prevent further ED visits and hospitalizations, we had patient stay in GI clinic while labs resulted as we had significant difficulty getting in touch with her and ensuring appropriate treatment of her Mg of 0.5 as this could lead to cardiac arrhythmias, death, etc. She agreed to stay today and we recommended administer 4 g IV magnesium sulfate for her Mg of 0.9. She was initially in agreement but then demanded to leave after receiving 2 g IV magnesium when we would not give her IV or PO pain medications. She understood that this was against our medical advice. I have previously discussed with patient that narcotics are not indicated in the treatment or management of inflammatory bowel disease and that we need to focus on non-narcotic analgesia to control her symptoms. As a result, I offered her Tylenol today and also sent a prescription for Cymbalta for her fibromyalgia-type pain. If this and correction of electrolyte abnormalities/vitamin deficiencies are unsuccessful at improving her diffuse body pain, she will need to be referred to pain clinic or Rheumatologist as I do not believe that her Crohn's disease alone is causing this.     Her dysphagia has resolved. She will remain on BID PPI and we will arrange EGD at the time of her colonoscopy when we assess her response to ustekinumab.     PLAN:  1. Crohn's disease - self-discontinued Entocort and now s/p IV ustekinumab loading dose. Given prior resections, will start bile acid sequestrant in an attempt to slow/bulk BMs due to prior resections.  Will also check C diff today to ensure infection not contributing.  - s/p IV ustekinumab infusion, she will now continue ustekinumab 90 mg [place] every 8 weeks   - ustekinumab teaching today with Sherrilyn Rist; she knows to call our office if any issues getting Rx or if fevers, other infectious issues   - start Colestid 1 g BID, separate by ~2 hours from other medications  - cyanocobalamin 1000 mcg [place] injection today, then start monthly injections at home  - vitamin D 50,000 IU weekly x 8 weeks, then vitamin D 2000 IU daily thereafter  - continue magnesium and potassium supplementation at home   - CBC, CMP, Mg, CRP  today; also send stool for C difficile  - will need colonoscopy ~6 months after starting therapy to assess response; will order at next visit   - must stop smoking as below     2. Iron deficiency anemia - likely from chronic GI blood loss due to active Crohn's disease off therapy for many years. Hgb slightly improved after IV iron infusion x 1.   - CBC today  - needs to call infusion center back to reschedule second infusion  - will check ferritin, iron panel at next visit and determine need for further IV iron    3. Esophagitis - improved. Likely a result of her prolonged hospitalization for ARDS 2/2 Rhinovirus and unrelated to her Crohn's disease.   - continue BID PPI for now, instructed to take on an empty stomach 30 minutes before breakfast and dinner  - can repeat EGD when we are reassessing Crohn's disease response to ustekinumab     4. Tobacco abuse - counseled extensively on the need for tobacco cessation for her Crohn's disease. Tobacco cessation is as important as starting a new medication.  - referral to smoking cessation program placed again today    5. Body pains, history of fibromyalgia - unlikely her all over body pain is from Crohn's disease alone.  - electrolyte and vitamin repletion as above  - start Cymbalta 30 mg daily x 1 week, then increase to 60 mg daily thereafter   - if ineffective at controlling symptoms, will need referral to pain clinic although discussed that do not recommend narcotics for treatment of her pain     6. Prevention - discussed importance of tobacco cessation as above. Also discussed need for annual influenza vaccine, pneumococcal vaccine series, and hepatitis B vaccine series. She reports being up to date on tetanus shot. She received Prevnar 01/19/18 as well as first hepatitis B vaccine 01/19/18.  - will need Pneumovax and next hepatitis B vaccine at next visit   - administer first hepatitis B vaccine today    Follow up in 2-3 months or sooner as needed.     Patient was seen and discussed with Dr. Jacqulyn Bath who agrees with above assessment and plan.   --------------------------------------------  Mardee Postin, M.D.  [facility]at[facility]  Multidisciplinary Inflammatory Bowel Disease Clinic    I saw and evaluated the patient, participating in the key portions of the service.?? I reviewed the resident???s note.?? I agree with the resident???s findings and plan.             Millie D. Long MD, MPH  Associate Professor of Medicine  [place] [facility] [place] [facility]  Division of Gastroenterology and Hepatology

## 2018-02-27 NOTE — Unmapped (Signed)
Patient refused the last 2 grams of magnesium. 2 of 4 grams infused. Patient stable. PIV removed. Pressure held. Gauze with tape dressing placed over site. Patient walked out of GI clinic accompanied by mother and son. Patient went to pick up prescriptions that Dr. [name] sent to the [facility] central inpatient pharmacy.

## 2018-02-27 NOTE — Unmapped (Signed)
Patient receiving IV magnesium due to a low serum magnesium level per Dr. [name]. Patient placed in the procedure room in the stretcher. Name and birth date verified. Arm band placed on patient. PIV placed. Magnesium infusing without any difficulty. Patient has had several magnesium infusions before. Patient requested pain medicine stating she has 9/10 generalized pain. Dr. [name] ordered tylenol but patient refused it. Patient states tylenol doesn't work and that she took some already this morning. Patient requested to have oxycodone or morphine. Dr. [name] and Dr. Jacqulyn Bath denied this request. Patient then stated she will not be staying the 4 hours to get the 4 grams of magnesium ordered since she cannot be sitting here in pain. Patient eating and drinking fluids that patient's mother brought to her. Patient given call bell and advised to call if she needs toileting or any assistance. Stretcher low and locked.

## 2018-03-02 MED ORDER — ERGOCALCIFEROL (VITAMIN D2) 1,250 MCG (50,000 UNIT) CAPSULE
ORAL_CAPSULE | 0 refills | 0 days
Start: 2018-03-02 — End: 2019-04-09

## 2018-03-30 MED ORDER — ONDANSETRON HCL 4 MG TABLET
ORAL_TABLET | Freq: Three times a day (TID) | ORAL | 1 refills | 0 days | Status: CP | PRN
Start: 2018-03-30 — End: 2018-08-14

## 2018-04-05 MED ORDER — USTEKINUMAB 90 MG/ML SUBCUTANEOUS SYRINGE: mL | 0 refills | 0 days

## 2018-04-05 MED FILL — EMPTY CONTAINER: 30 days supply | Qty: 1 | Fill #0 | Status: AC

## 2018-04-05 MED FILL — STELARA 90 MG/ML SUBCUTANEOUS SYRINGE: 34 days supply | Qty: 1 | Fill #0 | Status: AC

## 2018-04-05 MED FILL — EMPTY CONTAINER: 30 days supply | Qty: 1 | Fill #0

## 2018-04-05 NOTE — Unmapped (Signed)
[facility]   Patient Onboarding/Medication Counseling    Mary Hawkins is a 37 y.o. female with Crohn's Disease who I am counseling today on initiation of therapy.    Medication: Stelara 90mg /ml    Verified patient's date of birth / HIPAA.      Education Provided: ?    Dose/Administration discussed: Inject the contents of 1 syringe under the skin every 8 weeks. This medication should be taken  without regard to food.  Stressed the importance of taking medication as prescribed and to contact provider if that changes at any time.  Discussed missed dose instructions.    Storage requirements: this medicine should be stored in the refrigerator.     Side effects / precautions discussed: Discussed common side effects, including but not limited to injection site reacions, feeling tired/weak, headaches, etc. If patient experiences signs/sympoms of an allergic reaction (hives/rash/itching, red/swollen/blistered/peeling skin, wheezing, tightness in chest/throat, difficultly breathing/swallowing/talking, unusual hoarseness, swelling of mouth/face/lips/tongue/throat, etc.), s/sx of infections, sob, chest pain, skin changes, etc., they need to call the doctor.  Patient will receive a drug information handout with shipment.    Handling precautions / disposal reviewed:  Patient will dispose of needles in a sharps container or empty laundry detergent bottle.    Drug Interactions: other medications reviewed and up to date in Epic.  No drug interactions identified.    Comorbidities/Allergies: reviewed and up to date in Epic.    Verified therapy is appropriate and should continue      Delivery Information    Medication Assistance provided: Prior Authorization    Anticipated copay of $3 reviewed with patient. Verified delivery address in Epic.    Scheduled delivery date: 04/06/18    Explained that medication will be delivered by [employer]. and this shipment will not require a signature.      Explained the services we provide at [facility] and that each month we would call to set up refills.  Stressed importance of returning phone calls so that we could ensure they receive their medications in time each month.  Informed patient that we should be setting up refills 7-10 days prior to when they will run out of medication.  Informed patient that welcome packet will be sent.      Patient verbalized understanding of the above information as well as how to contact the pharmacy at [phone] option 4 with any questions/concerns.  The pharmacy is open Monday through Friday 8:30am-4:30pm.  A pharmacist is available 24/7 via pager to answer any clinical questions they may have.        Patient Specific Needs      ? Patient has no physical, cognitive, or cultural barriers.    ? Patient prefers to have medications discussed with  Patient     ? Patient is able to read and understand education materials at a high school level or above.    ? Patient's primary language is  English           Lupita Shutter  [facility] Specialty Pharmacist

## 2018-05-23 MED ORDER — STELARA 90 MG/ML SUBCUTANEOUS SYRINGE
SUBCUTANEOUS | 3 refills | 0 days | Status: CP
Start: 2018-05-23 — End: ?

## 2018-05-23 MED ORDER — EMPTY CONTAINER
PRN refills | 0 days
Start: 2018-05-23 — End: ?

## 2018-05-23 NOTE — Unmapped (Signed)
Stelara refill provided per protocol

## 2018-06-01 NOTE — Unmapped (Signed)
[facility] Refill Coordination Note  Medication: Stelara 90mg /ml    Unable to reach patient to schedule shipment for medication being filled at [facility]. Left voicemail on phone.  As this is the 3rd unsuccessful attempt to reach the patient, no additional phone call attempts will be made at this time.      Phone numbers attempted: [phone]; [phone]; [phone]; [phone]  Dates called: 05/23/18; 05/28/18; 06/01/18  Last scheduled delivery: 04/05/18    Please call the [facility] at [phone] (option [phone]) should you have any further questions.      Thanks,  [facility] Specialty Team

## 2018-06-04 NOTE — Unmapped (Signed)
Called patient to follow up on her MyChart message that she sent last week. She did not pick up phone and I encouraged her to call back or reply to MyChart message to further discuss her concerns.

## 2018-06-04 NOTE — Unmapped (Signed)
Called pt as [place] [facility] has been unable to reach her to set up delivery of stelara SQ. Unable to leave VM as pt VM not set up.

## 2018-06-19 NOTE — Unmapped (Deleted)
[facility]  FOLLOW UP NOTE - INFLAMMATORY BOWEL DISEASE  06/18/2018    Demographics:  Mary Hawkins is a 37 y.o. female with Crohn's disease    Referring physician:   [facility]          HPI / NOTE :     Today, I saw Mary Hawkins for follow up consultation in the  [facility] at the request of [facility] regarding Crohn's Disease.  Outside records including available clinical notes, endoscopy reports, imaging results and pathology results were reviewed in detail as part of this follow up consultation.     HPI:  This is a 37 year old female with history of Crohn's disease who presents in follow up. She was last seen 02/27/18. At that time, she had completed IV loading dose of ustekinumab 02/16/18 (although this was delayed due to patient failing to schedule this on multiple occasions in June). She received delivery of ustekinumab 04/05/18 and [facility] pharmacy tried to call on 05/23/18, 05/28/18, 06/01/18 to schedule shipment for ustekinumab but all calls were unsuccessful. Patient has ***          She is accompanied by her mother as well as her son today.  At her last visit on 01/19/18, we discussed various options for management of her Crohn's disease and decision was made to initiate therapy with ustekinumab as had infectious complications from anti-TNFs in the past and was complaining of severe arthralgias. At the time of her last visit, she was found to have Mg 0.5, K 3.3. We encouraged her to go to ED on multiple occasions for IV repletion of Mg. She initially refused to go and then says she eventually went to the ED and they kept her overnight. She is now taking 800 mg mag oxide per day.       She is now having about 8-10 BMs per day. Sometimes she has days when she has 3 BMs per day. BMs are not quite as loose as what they were. Not seeing any blood. Having urgency but no incontinence. She is having several nocturnal BMs as well. She was supposed to be on Entocort 9 mg daily until starting Stelara and then we had planned to taper this medication. She self-discontinued Entocort 02/03/18 because she was gaining weight. Weight has decreased slightly since that time. She isn't sure that it made a big difference with regards to her diarrhea.    She is now having worsening diffuse abdominal pain as well as body pains all over. She thinks this may be worse after getting the IV loading dose of Stelara. Pains are all over in her joints, back, neck. Says she has been diagnosed with fibromyalgia in the past. Asking for a medicine for this because can't wait for the Stelara to work.     She denies any dysphagia or odynophagia. Remains on BID PPI. She continues to smoke and is now smoking 1 ppd. I referred her to tobacco cessation counselor at her last visit but she says she never called them back to schedule appointment.     Review of Systems: positive as per HPI   Otherwise, the balance of 10 systems is negative.          IBD HISTORY:     Year of disease onset:  2010  Diagnosis:  Crohn's Disease  Age at onset:   34-40 yr old (A2)  Location:  Ileocolonic (L3)  Behavior:  Stricturing (B2)  Perianal Dz:  No    Brief IBD Disease Course:    2010 diagnosed with stricturing ileal Crohn's disease, treated 8 months with prednisone. 02/2010 start of Humira. 03/2011 stop Humira due to recurrent skin infections, started Entocort, issues with rectal prolapse and found to have ileal stricture. 11/13/13 ileocecal resection with Dr. Epifania Gore (28 cm of ileum resected, 5 cm colon resected - path c/w Crohn's disease but no granulomas), also with incidental Meckel's diverticulum noted and resected, mesh rectopexy for rectal prolapse. 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 03/18/14 discussed Remicade/Imuran but did not start these medications. 5/1-5/15/19 admission to [facility] for ARDS 2/2 rhinovirus requiring mechanical ventilation, AKI. 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 02/16/18 IV loading dose of ustekinumab followed by ustekinumab 90 mg [place] every 8 weeks.    Endoscopy:      Colon 12/27/17: Mild inflammation and a few ulcerations at ileocolonic anastomosis. Rutgeert's i2. Localized mildly granular mucosa in the sigmoid colon.  - path: Small bowel - patchy crypt architectural distortion with Paneth cell hyperplasia, consistent with mild chronic active enteritis, negative for dysplasia, no granulomas. Colon - unremarkable colonic mucosa.   EGD 12/27/17: Grade D esophagitis, small hiatal hernia, otherwise normal stomach and duodenum.   - path: ulceration with inflamed granulation tissue, no granulomas  Colon 02/20/14: Multiple discrete ulcerations seen corresponding to Rutgeert's i2.   EGD 11/12/13: Normal esophagus. Markedly thickened gastric folds without inflammation seen. Normal duodenum.     Imaging:    CT A/P 12/24/17: Mild wall thickening of the neoterminal ileum, possibly mild ileus. Right lower quadrant adenopathy, probably reactive.    Prior IBD medications (type, dose, duration, response):  - prednisone  - Entocort  - adalimumab 2011-2012, good response but developed recurrent skin infections prompting discontinuation  - ustekinumab 02/2018 -             Past Medical History:   Past medical history:   Past Medical History:   Diagnosis Date   ??? Anxiety    ??? Arthritis    ??? Back pain    ??? Crohn disease (CMS-HCC)    ??? Eczema    ??? Endometriosis    ??? GERD (gastroesophageal reflux disease)    ??? Headache(784.0)    ??? Hypertension    ??? Ulcerative colitis (CMS-HCC)      Past surgical history:   Past Surgical History:   Procedure Laterality Date   ??? BREAST RECONSTRUCTION  2004   ??? ENDOMETRIAL ABLATION     ??? PR COLONOSCOPY FLX DX W/COLLJ SPEC WHEN PFRMD N/A 11/12/2013    Procedure: COLONOSCOPY, FLEXIBLE, PROXIMAL TO SPLENIC FLEXURE; DIAGNOSTIC, W/WO COLLECTION SPECIMEN BY BRUSH OR WASH;  Surgeon: Teodoro Spray, MD;  Location: [facility];  Service: Gastroenterology   ??? PR COLONOSCOPY FLX DX W/COLLJ SPEC WHEN PFRMD N/A 02/20/2014    Procedure: COLONOSCOPY, FLEXIBLE, PROXIMAL TO SPLENIC FLEXURE; DIAGNOSTIC, W/WO COLLECTION SPECIMEN BY BRUSH OR WASH;  Surgeon: Billie Ruddy, MD;  Location: [facility];  Service: Gastroenterology   ??? PR COLONOSCOPY FLX DX W/COLLJ SPEC WHEN PFRMD N/A 12/27/2017    Procedure: COLONOSCOPY, FLEXIBLE, PROXIMAL TO SPLENIC FLEXURE; DIAGNOSTIC, W/WO COLLECTION SPECIMEN BY BRUSH OR WASH;  Surgeon: Bronson Curb, MD;  Location: GI [facility]URES [facility];  Service: Gastroenterology   ??? PR REMVL COLON & TERM ILEUM W/ILEOCOLOSTOMY N/A 11/13/2013    Procedure: Ileocecectomy, meckel's diverticulectomy, mesh rectopexy;  Surgeon: Emilia Beck, MD;  Location: MAIN OR [facility];  Service: Gastrointestinal   ???  PR UPPER GI ENDOSCOPY,BIOPSY N/A 11/12/2013    Procedure: UGI ENDOSCOPY; WITH BIOPSY, SINGLE OR MULTIPLE;  Surgeon: Teodoro Spray, MD;  Location: [facility];  Service: Gastroenterology   ??? PR UPPER GI ENDOSCOPY,BIOPSY N/A 12/27/2017    Procedure: UGI ENDOSCOPY; WITH BIOPSY, SINGLE OR MULTIPLE;  Surgeon: Bronson Curb, MD;  Location: [facility];  Service: Gastroenterology   ??? RECTAL PROLAPSE REPAIR       Family history:   Family History   Problem Relation Age of Onset   ??? Esophageal cancer Maternal Grandfather    ??? Cancer Maternal Grandfather         Gastric cancer     Social history: Single - has son. She is currently smoking 1 ppd - she had quit in the hospital but started again. Occasional alcohol use. No illicit drug use.   Social History     Socioeconomic History   ??? Marital status: Single     Spouse name: Not on file   ??? Number of children: Not on file   ??? Years of education: Not on file   ??? Highest education level: Not on file   Occupational History   ??? Not on file   Social Needs   ??? Financial resource strain: Not on file   ??? Food insecurity:     Worry: Not on file     Inability: Not on file   ??? Transportation needs:     Medical: Not on file     Non-medical: Not on file   Tobacco Use   ??? Smoking status: Current Every Day Smoker     Packs/day: 1.00     Types: Cigarettes     Last attempt to quit: 12/06/2017     Years since quitting: 0.5   ??? Smokeless tobacco: Never Used   Substance and Sexual Activity   ??? Alcohol use: Yes     Comment: socially   ??? Drug use: No   ??? Sexual activity: Not on file   Lifestyle   ??? Physical activity:     Days per week: Not on file     Minutes per session: Not on file   ??? Stress: Not on file   Relationships   ??? Social connections:     Talks on phone: Not on file     Gets together: Not on file     Attends religious service: Not on file     Active member of club or organization: Not on file     Attends meetings of clubs or organizations: Not on file     Relationship status: Not on file   Other Topics Concern   ??? Not on file   Social History Narrative    Currently unemployed.  She is a 67-year-old son which takes care of.             Allergies:     Allergies   Allergen Reactions   ??? Penicillins Hives     Has patient had a PCN reaction causing immediate rash, facial/tongue/throat swelling, SOB or lightheadedness with hypotension: Yes  Has patient had a PCN reaction causing severe rash involving mucus membranes or skin necrosis: Unk  Has patient had a PCN reaction that required hospitalization: Yes  Has patient had a PCN reaction occurring within the last 10 years: No  If all of the above answers are NO, then may proceed with Cephalosporin use.  Has patient had a PCN reaction causing immediate rash,  facial/tongue/throat swelling, SOB or lightheadedness with hypotension: Yes  Has patient had a PCN reaction causing severe rash involving mucus membranes or skin necrosis: Unk  Has patient had a PCN reaction that required hospitalization: Yes  Has patient had a PCN reaction occurring within the last 10 years: No  If all of the above answers are NO, then may proceed with Cephalosporin use.   ??? Doxycycline Hcl Other (See Comments)     Reaction not recalled             Medications:     Current Outpatient Medications   Medication Sig Dispense Refill   ??? aluminum-magnesium hydroxide-simethicone (MAALOX PLUS) 200-200-20 mg/5 mL Susp TAKE BY MOUTH EVERY 6 HOURS AS NEEDED 710 mL 0   ??? colestipol (COLESTID) 1 gram tablet Take 1 tablet (1 g total) by mouth Two (2) times a day. 180 tablet 3   ??? cyanocobalamin 1,000 mcg/mL injection Inject 1 mL (1,000 mcg total) under the skin every thirty (30) days. 4 mL 2   ??? DULoxetine (CYMBALTA) 30 MG capsule Take 1 capsule daily x 1 week, then 2 capsules daily thereafter. 60 capsule 3   ??? empty container Misc Use as directed 1 each PRN   ??? magnesium oxide (MAG-OX) 400 mg (241.3 mg magnesium) tablet Take 1 tablet by mouth Two (2) times a day.  1   ??? metoprolol succinate (TOPROL-XL) 50 MG 24 hr tablet Take 50 mg by mouth daily.     ??? multivitamin therapeutic with minerals (THERA-M) 27-0.4 mg Tab Take 1 tablet by mouth daily. With iron and calcium     ??? ondansetron (ZOFRAN) 4 MG tablet Take 1 tablet (4 mg total) by mouth every eight (8) hours as needed for nausea. 30 tablet 1   ??? pantoprazole (PROTONIX) 40 MG tablet TAKE 1 TABLET BY MOUTH TWICE DAILY 60 tablet 2   ??? potassium chloride SA (K-DUR,KLOR-CON) 20 MEQ tablet Take 20 mEq by mouth daily.     ??? syringe with needle, safety (BD SAFETY-LOK DETACHABLE NEEDL) 3 mL 25 gauge x 5/8 Syrg 1 each by Miscellaneous route every thirty (30) days. 25 Syringe 0   ??? ustekinumab (STELARA) 90 mg/mL Syrg syringe INJECT THE CONTENTS OF 1 SYRINGE (90MG ) UNDER THE SKIN EVERY 8 WEEKS 1 mL 0   ??? ustekinumab (STELARA) 90 mg/mL Syrg syringe Inject the contents of 1 syringe under the skin every 8 weeks 1 mL 3     Current Facility-Administered Medications   Medication Dose Route Frequency Provider Last Rate Last Dose   ??? acetaminophen (TYLENOL) tablet 1,000 mg  1,000 mg Oral Once Rosanne Sack, MD Physical Exam:   There were no vitals taken for this visit.    GEN: sitting in chair in NAD, appears comfortable on exam  HEENT: OP clear with no erythema, lesions, exudate, mucous membranes moist  NEURO:  gait normal, non-focal, no obvious neurologic abnormality  LUNGS: CTAB, no wheezes, rales, or rhonchi  CV: S1/S2, RRR, no murmurs  ABD: Soft, non-distended, non-tender, normoactive bowel sounds  Extremities: no cyanosis, clubbing or edema, normal gait  Psych: flat affect, very withdrawn throughout part of interview, A&O x3  SKIN: no visible lesions on face, neck, arms, abdomen  PERIANAL / RECTAL EXAM:   Deferred           Labs, Data & Indices:     Lab Review:   Lab Results   Component Value Date    WBC 13.8 (H) 02/27/2018  WBC 8.6 02/20/2014    RBC 4.59 02/27/2018    RBC 4.09 02/20/2014    HGB 10.3 (L) 02/27/2018    HGB 11.6 (L) 02/20/2014     Lab Results   Component Value Date    AST 15 02/27/2018    AST 21 02/18/2014    ALT 14 (L) 02/27/2018    ALT 15 02/18/2014    BUN 16 02/27/2018    BUN 8 03/18/2014    Creatinine 1.12 (H) 02/27/2018    Creatinine 0.86 03/18/2014    CO2 19.0 (L) 02/27/2018    CO2 27 03/18/2014    Albumin 3.8 02/27/2018    Albumin 3.9 03/18/2014    Calcium 8.9 02/27/2018    Calcium 7.3 (L) 03/18/2014     Lab Results   Component Value Date    TSH 3.433 (H) 01/19/2018    TSH 4.50 (H) 03/18/2014      HBsAg neg   HBcAb total neg  HBsAb neg  Quantiferon gold neg  TPMT normal  ............................................................................................................................................Marland Kitchen          Assessment & Recommendations:     Mary Hawkins is a 37 y.o. female with history of stricturing??ileal Crohn's disease??s/p ileocecal resection, mesh retropexy for rectal prolapse, incidental Meckel's diverticulum s/p resection 11/13/13 who presents to establish care after recent hospitalization. She has known active Crohn's disease with Rutgeert's i2 anastomosis. At her last visit, we obtained insurance authorization for use of ustekinumab. We left multiple voicemails for the patient instructing her to schedule her IV ustekinumab infusion but unfortunately this did not occur until 02/16/18. She is doing a little worse today than her last likely from a combination of delaying the start of ustekinumab, self-discontinuing her Entocort, and continuing to smoke cigarettes. She does not wish to restart any corticosteroids while we wait to see effects of ustekinumab. She also has severe electrolyte disturbances including continued hypomagnesemia, iron deficiency with ferritin 2.5, iron % sat 2, vitamin D deficiency with vitamin D 15.6. In order to prevent further ED visits and hospitalizations, we had patient stay in GI clinic while labs resulted as we had significant difficulty getting in touch with her and ensuring appropriate treatment of her Mg of 0.5 as this could lead to cardiac arrhythmias, death, etc. She agreed to stay today and we recommended administer 4 g IV magnesium sulfate for her Mg of 0.9. She was initially in agreement but then demanded to leave after receiving 2 g IV magnesium when we would not give her IV or PO pain medications. She understood that this was against our medical advice. I have previously discussed with patient that narcotics are not indicated in the treatment or management of inflammatory bowel disease and that we need to focus on non-narcotic analgesia to control her symptoms. As a result, I offered her Tylenol today and also sent a prescription for Cymbalta for her fibromyalgia-type pain. If this and correction of electrolyte abnormalities/vitamin deficiencies are unsuccessful at improving her diffuse body pain, she will need to be referred to pain clinic or Rheumatologist as I do not believe that her Crohn's disease alone is causing this.     Her dysphagia has resolved. She will remain on BID PPI and we will arrange EGD at the time of her colonoscopy when we assess her response to ustekinumab.     PLAN:  1. Crohn's disease - self-discontinued Entocort and now s/p IV ustekinumab loading dose. Given prior resections, will start bile acid sequestrant in an attempt to slow/bulk BMs  due to prior resections.  Will also check C diff today to ensure infection not contributing.  - s/p IV ustekinumab infusion, she will now continue ustekinumab 90 mg [place] every 8 weeks   - ustekinumab teaching today with Sherrilyn Rist; she knows to call our office if any issues getting Rx or if fevers, other infectious issues   - start Colestid 1 g BID, separate by ~2 hours from other medications  - cyanocobalamin 1000 mcg [place] injection today, then start monthly injections at home  - vitamin D 50,000 IU weekly x 8 weeks, then vitamin D 2000 IU daily thereafter  - continue magnesium and potassium supplementation at home   - CBC, CMP, Mg, CRP today; also send stool for C difficile  - will need colonoscopy ~6 months after starting therapy to assess response; will order at next visit   - must stop smoking as below     2. Iron deficiency anemia - likely from chronic GI blood loss due to active Crohn's disease off therapy for many years. Hgb slightly improved after IV iron infusion x 1.   - CBC today  - needs to call infusion center back to reschedule second infusion  - will check ferritin, iron panel at next visit and determine need for further IV iron    3. Esophagitis - improved. Likely a result of her prolonged hospitalization for ARDS 2/2 Rhinovirus and unrelated to her Crohn's disease.   - continue BID PPI for now, instructed to take on an empty stomach 30 minutes before breakfast and dinner  - can repeat EGD when we are reassessing Crohn's disease response to ustekinumab     4. Tobacco abuse - counseled extensively on the need for tobacco cessation for her Crohn's disease. Tobacco cessation is as important as starting a new medication.  - referral to smoking cessation program placed again today    5. Body pains, history of fibromyalgia - unlikely her all over body pain is from Crohn's disease alone.  - electrolyte and vitamin repletion as above  - start Cymbalta 30 mg daily x 1 week, then increase to 60 mg daily thereafter   - if ineffective at controlling symptoms, will need referral to pain clinic although discussed that do not recommend narcotics for treatment of her pain     6. Prevention - discussed importance of tobacco cessation as above. Also discussed need for annual influenza vaccine, pneumococcal vaccine series, and hepatitis B vaccine series. She reports being up to date on tetanus shot. She received Prevnar 01/19/18 as well as first hepatitis B vaccine 01/19/18.  - will need Pneumovax and next hepatitis B vaccine at next visit   - administer first hepatitis B vaccine today    Follow up in 2-3 months or sooner as needed.     Patient was seen and discussed with Dr. Marland Kitchen who agrees with above assessment and plan.   --------------------------------------------  Mardee Postin, M.D.  [facility]  Multidisciplinary Inflammatory Bowel Disease Clinic

## 2018-06-19 NOTE — Unmapped (Signed)
I called Ms. Idrovo to follow up as she no showed for her GI clinic appointment with me today. She did not pick up her phone but I left her a voicemail. She seems to be having issues getting to appointments at [facility] so want to talk to her about potential options including referral to local Gastroenterologist Dr. Allegra Lai who trained at [facility], or coming to clinic for Stelara injections with Sherrilyn Rist, we could also consider enrolling her in our collaborative care model program if she was in agreement. I asked her to call back my nurse Sherrilyn Rist at [phone] to discuss further.     Mardee Postin, M.D.

## 2018-08-14 ENCOUNTER — Ambulatory Visit: Admit: 2018-08-14 | Discharge: 2018-08-14 | Payer: MEDICAID | Attending: Internal Medicine | Primary: Internal Medicine

## 2018-08-14 DIAGNOSIS — R197 Diarrhea, unspecified: Secondary | ICD-10-CM

## 2018-08-14 DIAGNOSIS — D509 Iron deficiency anemia, unspecified: Secondary | ICD-10-CM

## 2018-08-14 DIAGNOSIS — K50919 Crohn's disease, unspecified, with unspecified complications: Principal | ICD-10-CM

## 2018-08-14 DIAGNOSIS — Z720 Tobacco use: Secondary | ICD-10-CM

## 2018-08-14 DIAGNOSIS — F418 Other specified anxiety disorders: Secondary | ICD-10-CM

## 2018-08-14 LAB — CBC W/ AUTO DIFF
BASOPHILS ABSOLUTE COUNT: 0.2 10*9/L — ABNORMAL HIGH (ref 0.0–0.1)
EOSINOPHILS RELATIVE PERCENT: 1.8 %
HEMATOCRIT: 46.4 % — ABNORMAL HIGH (ref 36.0–46.0)
HEMOGLOBIN: 14.5 g/dL (ref 12.0–16.0)
LARGE UNSTAINED CELLS: 2 % (ref 0–4)
MEAN CORPUSCULAR HEMOGLOBIN CONC: 31.2 g/dL (ref 31.0–37.0)
MEAN CORPUSCULAR HEMOGLOBIN: 29.1 pg (ref 26.0–34.0)
MEAN CORPUSCULAR VOLUME: 93.5 fL (ref 80.0–100.0)
MEAN PLATELET VOLUME: 7.8 fL (ref 7.0–10.0)
MONOCYTES ABSOLUTE COUNT: 0.6 10*9/L (ref 0.2–0.8)
MONOCYTES RELATIVE PERCENT: 4.2 %
NEUTROPHILS ABSOLUTE COUNT: 10.1 10*9/L — ABNORMAL HIGH (ref 2.0–7.5)
NEUTROPHILS RELATIVE PERCENT: 70.8 %
PLATELET COUNT: 500 10*9/L — ABNORMAL HIGH (ref 150–440)
RED BLOOD CELL COUNT: 4.96 10*12/L (ref 4.00–5.20)
RED CELL DISTRIBUTION WIDTH: 16.4 % — ABNORMAL HIGH (ref 12.0–15.0)
WBC ADJUSTED: 14.3 10*9/L — ABNORMAL HIGH (ref 4.5–11.0)

## 2018-08-14 LAB — SMEAR REVIEW

## 2018-08-14 LAB — COMPREHENSIVE METABOLIC PANEL
ALBUMIN: 4 g/dL (ref 3.5–5.0)
ALKALINE PHOSPHATASE: 96 U/L (ref 38–126)
ALT (SGPT): 7 U/L (ref <35)
ANION GAP: 16 mmol/L — ABNORMAL HIGH (ref 7–15)
AST (SGOT): 14 U/L (ref 14–38)
BILIRUBIN TOTAL: 0.6 mg/dL (ref 0.0–1.2)
BLOOD UREA NITROGEN: 15 mg/dL (ref 7–21)
CALCIUM: 8.1 mg/dL — ABNORMAL LOW (ref 8.5–10.2)
CO2: 11 mmol/L — CL (ref 22.0–30.0)
CREATININE: 1.24 mg/dL — ABNORMAL HIGH (ref 0.60–1.00)
EGFR CKD-EPI AA FEMALE: 64 mL/min/{1.73_m2} (ref >=60)
EGFR CKD-EPI NON-AA FEMALE: 56 mL/min/{1.73_m2} — ABNORMAL LOW (ref >=60)
GLUCOSE RANDOM: 76 mg/dL (ref 70–179)
POTASSIUM: 3.3 mmol/L — ABNORMAL LOW (ref 3.5–5.0)
PROTEIN TOTAL: 7 g/dL (ref 6.5–8.3)
SODIUM: 141 mmol/L (ref 135–145)

## 2018-08-14 LAB — BILIRUBIN TOTAL: Bilirubin:MCnc:Pt:Ser/Plas:Qn:: 0.6

## 2018-08-14 LAB — C-REACTIVE PROTEIN: C reactive protein:MCnc:Pt:Ser/Plas:Qn:: 22.8 — ABNORMAL HIGH

## 2018-08-14 LAB — MAGNESIUM: Magnesium:MCnc:Pt:Ser/Plas:Qn:: 0.6 — CL

## 2018-08-14 LAB — WBC ADJUSTED: Lab: 14.3 — ABNORMAL HIGH

## 2018-08-14 LAB — IRON & TIBC
IRON SATURATION (CALC): 5 % — ABNORMAL LOW (ref 15–50)
TOTAL IRON BINDING CAPACITY (CALC): 520.9 mg/dL — ABNORMAL HIGH (ref 252.0–479.0)

## 2018-08-14 LAB — TOTAL IRON BINDING CAPACITY (CALC): Lab: 520.9 — ABNORMAL HIGH

## 2018-08-14 LAB — FERRITIN: Ferritin:MCnc:Pt:Ser/Plas:Qn:: 4.1

## 2018-08-14 MED ORDER — PANTOPRAZOLE 40 MG TABLET,DELAYED RELEASE
ORAL_TABLET | Freq: Two times a day (BID) | ORAL | 2 refills | 0.00000 days | Status: CP
Start: 2018-08-14 — End: 2018-08-30

## 2018-08-14 MED ORDER — DULOXETINE 60 MG CAPSULE,DELAYED RELEASE
ORAL_CAPSULE | Freq: Every day | ORAL | 5 refills | 0 days | Status: CP
Start: 2018-08-14 — End: 2018-08-30

## 2018-08-14 MED ORDER — METRONIDAZOLE 500 MG TABLET
ORAL_TABLET | Freq: Two times a day (BID) | ORAL | 0 refills | 0.00000 days | Status: CP
Start: 2018-08-14 — End: 2018-08-30

## 2018-08-14 MED ORDER — MAGNESIUM OXIDE 400 MG (241.3 MG MAGNESIUM) TABLET
ORAL_TABLET | Freq: Two times a day (BID) | ORAL | 5 refills | 0.00000 days | Status: CP
Start: 2018-08-14 — End: 2018-10-10

## 2018-08-14 MED ORDER — ONDANSETRON HCL 4 MG TABLET
ORAL_TABLET | Freq: Three times a day (TID) | ORAL | 1 refills | 0 days | Status: CP | PRN
Start: 2018-08-14 — End: 2018-08-30

## 2018-08-14 MED ORDER — COLESTIPOL 1 GRAM TABLET
ORAL_TABLET | Freq: Two times a day (BID) | ORAL | 3 refills | 0.00000 days | Status: CP
Start: 2018-08-14 — End: 2018-08-30

## 2018-08-14 MED ORDER — POTASSIUM CHLORIDE ER 20 MEQ TABLET,EXTENDED RELEASE(PART/CRYST)
ORAL_TABLET | Freq: Every day | ORAL | 5 refills | 0 days | Status: CP
Start: 2018-08-14 — End: 2018-10-10

## 2018-08-14 NOTE — Unmapped (Signed)
Depression  Goal: To develop skills to manage and/or eliminate depressive symptoms and improve how I feel about myself    Objective Intervention   Briseida will verbalize understanding of how self-care activities (good nutrition, good sleep hygiene, moderate exercise, self nurturing activities) impact emotional well-being and will develop and implement a plan to increase self nurturing activities. Assist Cynda in developing an understanding of healthy life- style skills (i.e. nutrition, exercise, sleep, mindfulness meditation & mindfulness skills, etc.). Develop plan to increase healthy activities. Provide positive reinforcement for taking action steps toward a healthier life-style.   Rafaela will learn to identify negative dysfunctional thoughts, challenge them, and replace them with more realistic self statements Cognitive Behavioral Therapy (CBT) to assist Cheralyn in increasing awareness of link between emotions, thoughts and behaviors; identify irrational beliefs and cognitive distortions; learn to challenge irrational beliefs and cognitive distortions and replace them with more realistic self statements; May also use Acceptance and Commitment concepts for working with distressing thoughts.   Neytiri will learn to identify underlying feelings that lead to depressed mood and/or negative feelings about self. Noemie will learn to explore and clarify these feelings. Assist Rhyen in identifying and verbalizing feelings to clarify them and increase interpersonal awareness as to causes; use insight gained to assist Rebekah in resolving negative emotions in a more effective manner   Victorino Dike explore and verbalize goals for her personal identity (who I want to be). Jaydence will verbalize personal values and use these values to drive decision life decision making ACT- Assist Zykia in identifying and verbalizing feelings to clarify them and increase interpersonal awareness as to causes; Values clarification exercises   Tiaunna will identify options for pursuing meaningful activity in her life and develop a plan to engage in these types of activities (i.e. volunteering, helping others, support groups, etc)   Develop a plan with the Caleigha to include activities that will increase her satisfaction, fulfill her values, and improve the quality of her life. Evaluate effectiveness of plan; Introduce Goal setting and problem-solving skills   Priscilla will identify list of pleasant activities and engage in at least one pleasant activity per day   Cognitive Behavioral Therapy focused on problem solving and discussion of barriers toward action. Behavioral interventions focused on increasing activity level   Issis will take medications as prescribed to help support overall wellbeing Coordinate care with primary care provider     Anxiety (general & panic)    Goal:  To gain skills to minimize and manage worries, fears, panic and other symptoms of anxiety    Objective Intervention   Reduce intensity and frequency of panic attacks by learning skills to manage panic symptoms Cognitive Behavioral Therapy (CBT) including Psychoeducation re: flight/fight/freeze response; normalizing physical symptoms of anxiety response; diaphragmatic breathing; moderating self coping statements, beginning to minimize avoidance of panic triggers through gradual exposure and response prevention.   Samyia will learn to recognize the physical signs of anxiety and recognize that these are normal body reactions Cognitive Behavioral Therapy (CBT)  to assist Mychaela in understanding emotional, cognitive, and behavioral components of anxiety; develop skills to manage anxiety symptoms   Minimize avoidance behaviors- Marcelyn will develop a hierarchy of anxiety producing situations and thoughts and gradually expose herself to these situations while using anxiety management skills Systematic Desensitization (education Wynter on purpose; Develop Hierarchy: Begin gradual exposure to items on hierarchy while utilizing anxiety management strategies)   Ailah will learn to identify and challenge needs or tendencies which predispose a person to anxiety (need  to control, perfectionism, need for approval, self criticism, catastrophizing, etc) Cognitive Behavioral Therapy (CBT)  to assist Symiah in understanding emotional, cognitive, and behavioral components of anxiety; develop skills to manage anxiety symptoms; May also integrate Acceptance and Commitment Therapy interventions   Rhetta will learn to identify anxiety producing thoughts and self statements and develop and use skills to reduce intensity and frequency of worry thoughts Analyze Tenelle's fear/worry by examining the probability of the negative expectation occurring, the real consequences of it occurring,Salena's ability to control the outcomes, the worst possible outcome, and her ability to accept it; Depending on response may take more of a ACT approach integrating mindful awareness of thoughts    Maddisen will learn various relaxation & mindfulness skills (i.e. progressive muscle relaxation, guided imagery, relaxation breathing, meditation, etc.) and begin to use these skills Mindfulness Skills- Teach Catherin relaxation strategies (i.e. progressive muscle relaxation, guided imagery, deep breathing, etc.)   Amisadai will verbalize understanding of how self-care activities (good nutrition, good sleep hygiene, reducing caffeine intake, moderate exercise, self nurturing activities) impact emotional well-being and will develop and implement a plan to increase self nurturing activities. Assist Dalesha in developing an understanding of healthy life- style skills (i.e. nutrition, exercise, sleep, mindfulness meditation & mindfulness skills, etc.). Develop plan to increase healthy activities. Provide positive reinforcement for taking action steps toward a healthier life-style.   Anay will take medications as prescribed to help support overall wellbeing Coordinate care with primary care provider

## 2018-08-14 NOTE — Unmapped (Addendum)
[place]  FOLLOW UP NOTE - INFLAMMATORY BOWEL DISEASE  08/14/2018    Demographics:  Mary Hawkins is a 38 y.o. female with Crohn's disease    Referring physician:   [facility]          HPI / NOTE :     Today, I saw Mary Hawkins for follow up consultation in the [facility] at the request of [facility] regarding Crohn's Disease.  Outside records including available clinical notes, endoscopy reports, imaging results and pathology results were reviewed in detail as part of this follow up consultation.     HPI:  This is a 38 year old female with history of Crohn's disease who presents in follow up. She is accompanied by her mother today.  She was last seen in clinic on 02/27/18 and at that point had received IV ustekinumab infusion and was to continue on ustekinumab 90 mg [place] every 8 weeks. The [facility] pharmacy was unable to reach Ms. Donahoo to schedule shipment for ustekinumab. We called patient on multiple occasions to follow up on this but she did not pick up her phone. She no showed for clinic visit on 06/19/18 and I called to leave her a voicemail to determine why she was not in clinic. I gave her the option of transferring care locally to Dr. Allegra Lai vs coming to [facility] for ustekinumab injections.    She finally contacted [place] on 07/13/18 and reported that there were deaths in the family including her uncle and said she wanted to come to [facility] for ongoing management. She says she stopped the ustekinumab as had gained 15 lbs - she cut back on smoking from 1 ppd to 1/2 ppd over this time period and received 1 IV loading dose and 1-2 subcutaneous doses at home; last dose in September 2019 she believes.     She now reports 3-10 BMs per day which are loose but without blood. Not taking Colestid anymore - was on 1 g BID - didn't worsen symptoms but didn't help. Not taking antidiarrheals either. She does have some abdominal pain after eating as well as significant bloating. Also with occasional left flank pain but she thinks this is actually improving on its own.    She has heartburn requiring Protonix BID-TID but reports that dysphagia has improved.     Still has pain all over although somewhat improved after starting Cymbalta. Now on 60 mg daily. She is anxious and depressed. Very tearful, fatigued, not interested in usual activities. Worsened after death in family and due to her Crohn's disease. Denies SI/HI. Initially hesitant to enroll in multidisciplinary care model with social worker/psychiatry although she does not currently have a mental health provider.     Not taking KCl or Mg supplement. Says she stopped months ago. Had tingling and dizziness a few days ago. No recent ED visits. She initially said she would stay in clinic until labs resulted today but later was told by my nurse less than 30 minutes later that she had to leave.    No oral ulcers. Does have some arthralgias knees/hips but no erythema or swelling.    Review of Systems: positive as per HPI   Otherwise, the balance of 10 systems is negative.          IBD HISTORY:     Year of disease onset:  2010  Diagnosis:  Crohn's Disease  Age at onset:   68-40 yr old (A2)  Location:  [place] (L3)  Behavior:  Stricturing (B2)  Perianal Dz:  No    Brief IBD Disease Course:    2010 diagnosed with stricturing ileal Crohn's disease, treated 8 months with prednisone. 02/2010 start of Humira. 03/2011 stop Humira due to recurrent skin infections, started Entocort, issues with rectal prolapse and found to have ileal stricture. 11/13/13 ileocecal resection with Dr. Epifania Gore (28 cm of ileum resected, 5 cm colon resected - path c/w Crohn's disease but no granulomas), also with incidental Meckel's diverticulum noted and resected, mesh rectopexy for rectal prolapse. 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 03/18/14 discussed Remicade/Imuran but did not start these medications. 5/1-5/15/19 admission to [facility] for ARDS 2/2 rhinovirus requiring mechanical ventilation, AKI. 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 02/16/18 IV loading dose of ustekinumab followed by ustekinumab 90 mg [place] every 8 weeks; self-discontinued ustekinumab. 08/2018 repeat IV load ustekinumab.    Endoscopy:      Colon 12/27/17: Mild inflammation and a few ulcerations at ileocolonic anastomosis. Rutgeert's i2. Localized mildly granular mucosa in the sigmoid colon.  - path: Small bowel - patchy crypt architectural distortion with Paneth cell hyperplasia, consistent with mild chronic active enteritis, negative for dysplasia, no granulomas. Colon - unremarkable colonic mucosa.   EGD 12/27/17: Grade D esophagitis, small hiatal hernia, otherwise normal stomach and duodenum.   - path: ulceration with inflamed granulation tissue, no granulomas  Colon 02/20/14: Multiple discrete ulcerations seen corresponding to Rutgeert's i2.   EGD 11/12/13: Normal esophagus. Markedly thickened gastric folds without inflammation seen. Normal duodenum.     Imaging:    CT A/P 12/24/17: Mild wall thickening of the neoterminal ileum, possibly mild ileus. Right lower quadrant adenopathy, probably reactive.    Prior IBD medications (type, dose, duration, response):  - prednisone  - Entocort  - adalimumab 2011-2012, good response but developed recurrent skin infections prompting discontinuation  - ustekinumab 02/2018 - 04/2018 before self discontinued, reload 08/2018            Past Medical History:   Past medical history:   Past Medical History:   Diagnosis Date   ??? Anxiety    ??? Arthritis    ??? Back pain    ??? Crohn disease (CMS-HCC)    ??? Eczema    ??? Endometriosis    ??? GERD (gastroesophageal reflux disease)    ??? Headache(784.0)    ??? Hypertension    ??? Ulcerative colitis (CMS-HCC)      Past surgical history:   Past Surgical History:   Procedure Laterality Date   ??? BREAST RECONSTRUCTION  2004   ??? ENDOMETRIAL ABLATION     ??? PR COLONOSCOPY FLX DX W/COLLJ SPEC WHEN PFRMD N/A 11/12/2013    Procedure: COLONOSCOPY, FLEXIBLE, PROXIMAL TO SPLENIC FLEXURE; DIAGNOSTIC, W/WO COLLECTION SPECIMEN BY BRUSH OR WASH;  Surgeon: Teodoro Spray, MD;  Location: [facility];  Service: Gastroenterology   ??? PR COLONOSCOPY FLX DX W/COLLJ SPEC WHEN PFRMD N/A 02/20/2014    Procedure: COLONOSCOPY, FLEXIBLE, PROXIMAL TO SPLENIC FLEXURE; DIAGNOSTIC, W/WO COLLECTION SPECIMEN BY BRUSH OR WASH;  Surgeon: Billie Ruddy, MD;  Location: [facility];  Service: Gastroenterology   ??? PR COLONOSCOPY FLX DX W/COLLJ SPEC WHEN PFRMD N/A 12/27/2017    Procedure: COLONOSCOPY, FLEXIBLE, PROXIMAL TO SPLENIC FLEXURE; DIAGNOSTIC, W/WO COLLECTION SPECIMEN BY BRUSH OR WASH;  Surgeon: Bronson Curb, MD;  Location: [facility];  Service: Gastroenterology   ??? PR REMVL COLON & TERM ILEUM W/ILEOCOLOSTOMY N/A 11/13/2013  Procedure: Ileocecectomy, meckel's diverticulectomy, mesh rectopexy;  Surgeon: Emilia Beck, MD;  Location: MAIN OR [place];  Service: Gastrointestinal   ??? PR UPPER GI ENDOSCOPY,BIOPSY N/A 11/12/2013    Procedure: UGI ENDOSCOPY; WITH BIOPSY, SINGLE OR MULTIPLE;  Surgeon: Teodoro Spray, MD;  [facility]: [facility];  Service: Gastroenterology   ??? PR UPPER GI ENDOSCOPY,BIOPSY N/A 12/27/2017    Procedure: UGI ENDOSCOPY; WITH BIOPSY, SINGLE OR MULTIPLE;  Surgeon: Bronson Curb, MD;  Location: [facility];  Service: Gastroenterology   ??? RECTAL PROLAPSE REPAIR       Family history:   Family History   Problem Relation Age of Onset   ??? Esophageal cancer Maternal Grandfather    ??? Cancer Maternal Grandfather         Gastric cancer     Social history: Single - has son. She is currently smoking 1/2 ppd (down from 1 ppd) - she had quit in the hospital but started again. Occasional alcohol use. No illicit drug use.   Social History     Socioeconomic History   ??? Marital status: Single     Spouse name: None   ??? Number of children: None   ??? Years of education: None   ??? Highest education level: None   Occupational History   ??? None   Social Needs   ??? Financial resource strain: None   ??? Food insecurity:     Worry: None     Inability: None   ??? Transportation needs:     Medical: None     Non-medical: None   Tobacco Use   ??? Smoking status: Current Every Day Smoker     Packs/day: 1.00     Types: Cigarettes     Last attempt to quit: 12/06/2017     Years since quitting: 0.6   ??? Smokeless tobacco: Never Used   Substance and Sexual Activity   ??? Alcohol use: Yes     Comment: socially   ??? Drug use: No   ??? Sexual activity: None   Lifestyle   ??? Physical activity:     Days per week: None     Minutes per session: None   ??? Stress: None   Relationships   ??? Social connections:     Talks on phone: None     Gets together: None     Attends religious service: None     Active member of club or organization: None     Attends meetings of clubs or organizations: None     Relationship status: None   Other Topics Concern   ??? None   Social History Narrative    Currently unemployed.  She is a 45-year-old son which takes care of.             Allergies:     Allergies   Allergen Reactions   ??? Penicillins Hives     Has patient had a PCN reaction causing immediate rash, facial/tongue/throat swelling, SOB or lightheadedness with hypotension: Yes  Has patient had a PCN reaction causing severe rash involving mucus membranes or skin necrosis: Unk  Has patient had a PCN reaction that required hospitalization: Yes  Has patient had a PCN reaction occurring within the last 10 years: No  If all of the above answers are NO, then may proceed with Cephalosporin use.  Has patient had a PCN reaction causing immediate rash, facial/tongue/throat swelling, SOB or lightheadedness with hypotension: Yes  Has patient had a PCN reaction  causing severe rash involving mucus membranes or skin necrosis: Unk  Has patient had a PCN reaction that required hospitalization: Yes  Has patient had a PCN reaction occurring within the last 10 years: No  If all of the above answers are NO, then may proceed with Cephalosporin use.   ??? Doxycycline Hcl Other (See Comments)     Reaction not recalled             Medications:     Current Outpatient Medications   Medication Sig Dispense Refill   ??? acetaminophen (TYLENOL) 500 MG tablet Take 1,000 mg by mouth every six (6) hours as needed for pain.     ??? cyanocobalamin 1,000 mcg/mL injection Inject 1 mL (1,000 mcg total) under the skin every thirty (30) days. 4 mL 2   ??? DULoxetine (CYMBALTA) 60 MG capsule Take 1 capsule (60 mg total) by mouth daily. Take 1 capsule daily x 1 week, then 2 capsules daily thereafter. 30 capsule 5   ??? empty container Misc Use as directed 1 each PRN   ??? ondansetron (ZOFRAN) 4 MG tablet Take 1 tablet (4 mg total) by mouth every eight (8) hours as needed for nausea. 30 tablet 1   ??? pantoprazole (PROTONIX) 40 MG tablet Take 1 tablet (40 mg total) by mouth Two (2) times a day. 60 tablet 2   ??? syringe with needle, safety (BD SAFETY-LOK DETACHABLE NEEDL) 3 mL 25 gauge x 5/8 Syrg 1 each by Miscellaneous route every thirty (30) days. 25 Syringe 0   ??? aluminum-magnesium hydroxide-simethicone (MAALOX PLUS) 200-200-20 mg/5 mL Susp TAKE BY MOUTH EVERY 6 HOURS AS NEEDED (Patient not taking: Reported on 08/14/2018) 710 mL 0   ??? colestipol (COLESTID) 1 gram tablet Take 4 tablets (4 g total) by mouth Two (2) times a day. 180 tablet 3   ??? magnesium oxide (MAG-OX) 400 mg (241.3 mg magnesium) tablet Take 2 tablets (800 mg total) by mouth Two (2) times a day. 120 tablet 5   ??? metoprolol succinate (TOPROL-XL) 50 MG 24 hr tablet Take 50 mg by mouth daily.     ??? metroNIDAZOLE (FLAGYL) 500 MG tablet Take 1 tablet (500 mg total) by mouth two (2) times a day for 14 days. 28 tablet 0   ??? multivitamin therapeutic with minerals (THERA-M) 27-0.4 mg Tab Take 1 tablet by mouth daily. With iron and calcium     ??? potassium chloride SA (KLOR-CON) 20 MEQ tablet Take 1 tablet (20 mEq total) by mouth daily. 30 tablet 5   ??? ustekinumab (STELARA) 90 mg/mL Syrg syringe INJECT THE CONTENTS OF 1 SYRINGE (90MG ) UNDER THE SKIN EVERY 8 WEEKS (Patient not taking: Reported on 08/14/2018) 1 mL 0   ??? ustekinumab (STELARA) 90 mg/mL Syrg syringe Inject the contents of 1 syringe under the skin every 8 weeks (Patient not taking: Reported on 08/14/2018) 1 mL 3     Current Facility-Administered Medications   Medication Dose Route Frequency Provider Last Rate Last Dose   ??? acetaminophen (TYLENOL) tablet 1,000 mg  1,000 mg Oral Once Rosanne Sack, MD                 Physical Exam:   BP 160/118  - Pulse 103  - Temp (P) 36.5 ??C (Temporal)  - Wt 70.3 kg (155 lb)  - BMI 27.46 kg/m??     GEN: sitting in chair in NAD, somewhat tearful at times  HEENT: OP clear with no erythema, lesions, exudate, mucous membranes  moist  NEURO:  gait normal, non-focal, no obvious neurologic abnormality  LUNGS: CTAB, no wheezes, rales, or rhonchi  CV: S1/S2, RRR, no murmurs  ABD: Soft, non-distended, non-tender, normoactive bowel sounds  Extremities: no cyanosis, clubbing or edema, normal gait  Psych: flat affect, tearful, no SI/HI, A&O x3  SKIN: no visible lesions on face, neck, arms, abdomen  PERIANAL / RECTAL EXAM:   deferred           Labs, Data & Indices:     Lab Review:   Lab Results   Component Value Date    WBC 14.3 (H) 08/14/2018    WBC 8.6 02/20/2014    RBC 4.96 08/14/2018    RBC 4.09 02/20/2014    HGB 14.5 08/14/2018    HGB 11.6 (L) 02/20/2014     Lab Results   Component Value Date    AST 14 08/14/2018    AST 21 02/18/2014    ALT 7 08/14/2018    ALT 15 02/18/2014    BUN 15 08/14/2018    BUN 8 03/18/2014    Creatinine 1.24 (H) 08/14/2018    Creatinine 0.86 03/18/2014    CO2 11.0 (LL) 08/14/2018    CO2 27 03/18/2014    Albumin 4.0 08/14/2018    Albumin 3.9 03/18/2014    Calcium 8.1 (L) 08/14/2018    Calcium 7.3 (L) 03/18/2014     Lab Results   Component Value Date    TSH 3.433 (H) 01/19/2018    TSH 4.50 (H) 03/18/2014      HBsAg neg   HBcAb total neg  HBsAb neg  Quantiferon gold neg  TPMT normal  ............................................................................................................................................Marland Kitchen          Assessment & Recommendations:     Oluwaseun Bruyere is a 38 y.o. female with history of stricturing??ileal Crohn's disease??s/p ileocecal resection, mesh retropexy for rectal prolapse, incidental Meckel's diverticulum s/p resection 11/13/13 who presents in follow up. She has known active Crohn's disease with Rutgeert's i2 anastomosis and self-discontinued ustekinumab in September after only receiving 2-3 doses but has not shown up for clinic appointments since that time to readdress therapy plan. She does have ongoing diarrhea, occasional bloating after eating which is likely multifactorial from active Crohn's disease, possible SIBO and bile acid diarrhea given prior surgeries. Given that she had infectious adverse effect from anti-TNF therapy in the past, I do think restarting ustekinumab is best option at this point as she only received a few doses so cannot say that she failed therapy, also it is hard to attribute weight gain to this medication as has not been reported; additionally,  vedolizumab less effective in SB Crohn's disease and patient prefers to avoid anti-TNF therapy given prior complications. She does not tolerate steroids well from mental health/weight gain perspective so will try to treat symptomatically with bile acid sequestrant, anti-diarrheals, and possibly empiric treatment with antibiotic for SIBO if C diff testing negative. I am concerned about potential electrolyte abnormalities given history of severe hypoMg in the past so recommended that she stay in the clinic until these result which she initially agreed to but was later told by front desk staff that she had to leave and could no longer stay.    She does struggle with significant anxiety/depression with moderate scores on today's PHQ-9 and GAD-7 so would benefit in enrollment in COCM program. She met with Judeth Cornfield in clinic today and was willing to participate.     Initiating visit for [facility] Management    ??  Assessed behavioral health needs and concerns.   ?? Based on this assessment this patient has a diagnosed psychiatric disorder or behavioral health condition and would benefit from [facility] Management. This will include: behavioral care assessment, behavioral health care planning and provision of brief interventions.  ?? Provided education on the Behavioral Health Care Management Program  ?? Obtained verbal consent for enrollment in Behavioral health Care Management. (including consent to consult with relevant specialist and cost sharing for both in-person and non-face-to-face services provided)  ?? Enrolled patient in behavioral health care management.     I will provide oversight and management of this service, including reviewing treatment progress and modifying the care plan as needed.    PLAN:  1. Crohn's disease - self-discontinued ustekinumab; recommend reload with ustekinumab IV followed by 90 mg [place] every 8 weeks. Considered administering these doses in clinic but as we will be prescribing through shared services pharmacy, will allow patient to administer at home as will be notified if she does not receive therapy. We did discuss alternative treatment including anti-TNF therapy, vedolizumab but given that we do not have limitless treatment options for management of her severe Crohn's disease recommend optimizing our current therapeutic agent. While awaiting start of ustekinumab will start bile acid sequestrant and possibly Flagyl for empiric treatment of SIBO.  - CBC, CMP, Mg, CRP today; also send stool for C difficile (if negative will treat empirically with Flagyl 500 mg BID for SIBO given bloating as below)  - reload with IV ustekinumab then continue ustekinumab 90 mg [place] every 8 weeks; has already had injection teaching  - start Colestid 2 g BID, gave instructions to titrate at home including to increase to 3 mg BID next week if no improvement and then 4 mg BID the following week; can give up to 16 mg total per day  - continue cyanocobalamin 1000 mcg monthly injections at home; no refills needed today per patient   - continue magnesium and potassium supplementation at home; will refill today based upon labs  - will need colonoscopy ~6 months after restarting therapy to assess response; will order at next visit   - must stop smoking as below     2. Iron deficiency anemia - likely from chronic GI blood loss due to active Crohn's disease off therapy for many years. Will repeat labs today and determine need for further IV iron. She did not go to second infusion so I suspect she will need more repletion.   - CBC today  - ferritin, iron panel today to determine need for further IV iron    3. Esophagitis - improved. Likely a result of her prolonged hospitalization for ARDS 2/2 Rhinovirus and unrelated to her Crohn's disease.   - continue BID PPI for now, instructed to take on an empty stomach 30 minutes before breakfast and dinner  - can repeat EGD when we are reassessing Crohn's disease response to ustekinumab; holding off as stopped ustekinumab    4. Tobacco abuse - counseled extensively on the need for tobacco cessation for her Crohn's disease. Congratulated her on cutting back to 1/2 ppd. Tobacco cessation is as important as starting a new medication.  - encouraged ongoing tobacco cessation    5. Body pains, history of fibromyalgia - unlikely her all over body pain is from Crohn's disease alone.  - electrolyte and vitamin repletion as above  - continue Cymbalta 60 mg daily    6. Depression, anxiety: No SI/HI. Triggers include chronic illness,  home stressors, recent deaths in family.  - enroll in COCM program today    7. Elevated BP: This has been an issue in the past as well. Asymptomatic. Recommend follow up with PCP for ongoing management.    8. Prevention - discussed importance of tobacco cessation as above. Previously discussed need for annual influenza vaccine, pneumococcal vaccine series, and hepatitis B vaccine series. She reports being up to date on tetanus shot. She received Prevnar 01/19/18 as well as first hepatitis B vaccine 01/19/18.  - will need Pneumovax and next hepatitis B vaccine at next visit as this was not addressed today given other issues above     Follow up in 3 months or sooner as needed.     Patient was discussed with Dr. Jacqulyn Bath who agrees with above assessment and plan.   --------------------------------------------  Mardee Postin, M.D.  [facility] Gastroenterology & [facility]atology  Multidisciplinary Inflammatory Bowel Disease Clinic    Addendum: Called patient to discuss results of labs from today. Severely hypomagnesemic and bicarbonate quite low at 11. Recommend proceed to ED for electrolyte repletion, repeat BMP, VBG to assess degree of acidosis. Patient's mother voiced understanding. Also placed refills of KCl 20 mEq daily + mag oxide to her pharmacy. As C diff is negative, will send Rx for Flagyl for empiric treatment of SIBO in the event this is contributing to diarrhea/bloating. Also recommended start antidiarrheals while we are awaiting start of Stelara.     I discussed the patient with the resident.?? I reviewed the resident???s note.?? I agree with the resident???s findings and plan.             Millie D. Long MD, MPH  Associate Professor of Medicine  [place] [facility] [address] [facility]  Division of Gastroenterology and Hepatology

## 2018-08-14 NOTE — Unmapped (Signed)
CONSENT TO RECEIVE BEHAVIORAL HEALTH INTEGRATION SERVICES    Patient Name: Mary Hawkins  Patient MRN: [id number]    After discussions with the individual named below, I would like to receive behavioral health integration services, which may include general behavioral health care management (CPT [phone]) and/or psychiatric collaborative care management (CPT codes [phone]).   Those services may occur by phone, electronic visit (e-visit), or face-to-face and may include any or all of the following:    Continuity of care with a member of my clinical care team,    Creation of a plan of care for my behavioral or psychiatric health concerns that is specific to me (a copy will be available in my medical record),    Tracking my progress with my plan of care, and revising it as needed,    Management of my behavioral health conditions, including recommended, brief interventions and relapse prevention planning,    Coordination between my designated behavioral healthcare provider and other appropriate providers or consultants (such as a psychiatric consultant), and    Coordination of my behavioral health care, such as psychotherapy, pharmacotherapy, counseling and/or psychiatric consultation.    I understand that Medicare generally pays only one provider and his/her clinical care team to provide behavioral health integration services within a given calendar month. My designated provider for BHI services is [facility].    The charge for these services in 2019 ranges from $100-$330 per month (self pay rate of $60-$200 per month) depending on the level of service provided. I understand that a member of my behavioral health team can review my anticipated out of pocket cost before my enrollment or anytime thereafter. I understand I will be charged and expected to pay the usual Medicare deductible and coinsurance applied to physician services for behavioral health integration on a monthly basis; this includes services I receive face-to-face or non-face-to-face. Services provided vary by time and a minimum of 20 minutes will be provided each calendar month prior to [employer] Integration services being billed. Insurers other than Medicare may or may not pay for these services.    I authorize my clinical care team to communicate my medical information electronically with other healthcare providers, including psychiatric consultants and other specialists, as part of the care coordination involved in behavioral health integration services.    I understand that I can revoke this consent at any time by contacting my provider verbally or in writing. Any such revocation will be effective at the end of the calendar month in which the revocation is made. I understand I can choose to receive behavioral health integration services from another provider after the calendar month in which I revoke this consent.    This consent was reviewed with me by Ottie Glazier on 08/14/2018.    Verbal consent was obtained.

## 2018-08-14 NOTE — Unmapped (Addendum)
INSTRUCTIONS:     1.  Stool studies today - if this is negative, will consider a 2 week course of antibiotics for small intestinal bacterial overgrowth (SIBO) which may be contributing to your bloating    2.  Labs today  3.  Recommend restart Stelara - will need repeat infusion followed by injections every 2 months   4.  If any trouble or symptoms, do not hesitate to call.     Mardee Postin, MD  Division of Gastroenterology & Hepatology  [facility]    Clinical Nurse Contact:  Neta Mends, RN  Ph#  [phone]  Fax#  [phone]    For clinic appointments, please call, [phone], option 1.  For questions regarding radiology appointments, or to schedule, [phone].  For questions regarding scheduling GI procedures (e.g,. Colonoscopy), please call, [phone], option 2.    For educational material and resources:  [email or web address]  ============================================      CONSENT TO RECEIVE BEHAVIORAL HEALTH INTEGRATION SERVICES    Patient Name: Mary Hawkins  Patient MRN: [id number]    After discussions with the individual named below, I would like to receive behavioral health integration services, which may include general behavioral health care management (CPT [phone]) and/or psychiatric collaborative care management (CPT codes [phone]).   Those services may occur by phone, electronic visit (e-visit), or face-to-face and may include any or all of the following:    Continuity of care with a member of my clinical care team,    Creation of a plan of care for my behavioral or psychiatric health concerns that is specific to me (a copy will be available in my medical record),    Tracking my progress with my plan of care, and revising it as needed,    Management of my behavioral health conditions, including recommended, brief interventions and relapse prevention planning,    Coordination between my designated behavioral healthcare provider and other appropriate providers or consultants (such as a psychiatric consultant), and    Coordination of my behavioral health care, such as psychotherapy, pharmacotherapy, counseling and/or psychiatric consultation.    I understand that Medicare generally pays only one provider and his/her clinical care team to provide behavioral health integration services within a given calendar month. My designated provider for BHI services is [facility].    The charge for these services in 2019 ranges from $100-$330 per month (self pay rate of $60-$200 per month) depending on the level of service provided. I understand that a member of my behavioral health team can review my anticipated out of pocket cost before my enrollment or anytime thereafter. I understand I will be charged and expected to pay the usual Medicare deductible and coinsurance applied to physician services for behavioral health integration on a monthly basis; this includes services I receive face-to-face or non-face-to-face. Services provided vary by time and a minimum of 20 minutes will be provided each calendar month prior to [employer] Integration services being billed. Insurers other than Medicare may or may not pay for these services.    I authorize my clinical care team to communicate my medical information electronically with other healthcare providers, including psychiatric consultants and other specialists, as part of the care coordination involved in behavioral health integration services.    I understand that I can revoke this consent at any time by contacting my provider verbally or in writing. Any such revocation will be effective at the end of the calendar month in which the revocation  is made. I understand I can choose to receive behavioral health integration services from another provider after the calendar month in which I revoke this consent.    This consent was reviewed with me by Ottie Glazier on 08/14/2018. Verbal consent was obtained.

## 2018-08-15 NOTE — Unmapped (Signed)
Per Dr. [name], TP for feraheme updated for 2 doses 7-10days apart and pt to be reloaded with Stelara IV. TP updated as well. Sent msg to [employer] as pt will not need an [place]

## 2018-08-15 NOTE — Unmapped (Signed)
Called patient to discuss results of labs from today. Severely hypomagnesemic and bicarbonate quite low at 11. Recommend proceed to ED for electrolyte repletion, repeat BMP, VBG to assess degree of acidosis. Patient's mother voiced understanding. Also placed refills of KCl 20 mEq daily to her pharmacy.     C diff negative. Will send Rx for Flagyl for empiric treatment of SIBO in the event this is contributing to diarrhea/bloating. Also recommended start antidiarrheals while we are awaiting start of Stelara.     > 5 minutes spent on phone with patient's mother.    Mardee Postin, M.D.  Gastroenterology Fellow

## 2018-08-20 NOTE — Unmapped (Signed)
Behavioral Health Care Management            Date of Service:  08/20/2018        Date of Last Encounter:  08/14/2018    Purpose of contact:     [place] [facility] Social Worker contacted the patient as part of the BHCMP- LVM.    Follow Up Plan/Next Steps:    []  Schedule follow up appointment with GI Physician  []  Provide referral/linkage information:  []  Consult with GI Physician, re: care plan and patient's treatment progress  [x]  Other: f/u mood, behavioral health assessment      Additional Information/Plan:  Pt was provided with my direct office phone number should additional needs arise.    Future Appointments   Date Time Provider Department Center   11/06/2018 10:00 AM Rosanne Sack, MD [facility]       Ottie Glazier, MSW, M.Ed, LCSWA  Social Worker- MSW  [facility]  [phone]

## 2018-08-22 NOTE — Unmapped (Signed)
Called pt to let her know she may now schedule iron infusion x2 7-10days apart and stelara infusion at our infusion center. Pt did not answer, left vm  Sent mychart msg as well. Pt does have f/u with Dr [name] on 11/06/18

## 2018-08-25 ENCOUNTER — Encounter (HOSPITAL_COMMUNITY): Payer: Self-pay | Admitting: Emergency Medicine

## 2018-08-25 ENCOUNTER — Emergency Department (HOSPITAL_COMMUNITY)
Admission: EM | Admit: 2018-08-25 | Discharge: 2018-08-25 | Disposition: A | Discharge status: Home / Self Care | Payer: Medicaid Other | Attending: Emergency Medicine | Admitting: Emergency Medicine

## 2018-08-25 ENCOUNTER — Emergency Department (HOSPITAL_COMMUNITY): Payer: Medicaid Other

## 2018-08-25 DIAGNOSIS — R197 Diarrhea, unspecified: Secondary | ICD-10-CM | POA: Diagnosis not present

## 2018-08-25 DIAGNOSIS — R10817 Generalized abdominal tenderness: Secondary | ICD-10-CM | POA: Insufficient documentation

## 2018-08-25 DIAGNOSIS — F17210 Nicotine dependence, cigarettes, uncomplicated: Secondary | ICD-10-CM | POA: Diagnosis not present

## 2018-08-25 DIAGNOSIS — I10 Essential (primary) hypertension: Secondary | ICD-10-CM | POA: Insufficient documentation

## 2018-08-25 DIAGNOSIS — Z79899 Other long term (current) drug therapy: Secondary | ICD-10-CM | POA: Insufficient documentation

## 2018-08-25 DIAGNOSIS — R1012 Left upper quadrant pain: Secondary | ICD-10-CM

## 2018-08-25 DIAGNOSIS — R110 Nausea: Secondary | ICD-10-CM | POA: Insufficient documentation

## 2018-08-25 LAB — CBC WITH DIFFERENTIAL/PLATELET
Abs Immature Granulocytes: 0.23 10*3/uL — ABNORMAL HIGH (ref 0.00–0.07)
Basophils Absolute: 0.1 10*3/uL (ref 0.0–0.1)
Basophils Relative: 1 %
Eosinophils Absolute: 0.3 10*3/uL (ref 0.0–0.5)
Eosinophils Relative: 2 %
HCT: 42.9 % (ref 36.0–46.0)
Hemoglobin: 13.1 g/dL (ref 12.0–15.0)
Immature Granulocytes: 1 %
Lymphocytes Relative: 19 %
Lymphs Abs: 3.1 10*3/uL (ref 0.7–4.0)
MCH: 27.9 pg (ref 26.0–34.0)
MCHC: 30.5 g/dL (ref 30.0–36.0)
MCV: 91.3 fL (ref 80.0–100.0)
Monocytes Absolute: 1 10*3/uL (ref 0.1–1.0)
Monocytes Relative: 6 %
Neutro Abs: 11.9 10*3/uL — ABNORMAL HIGH (ref 1.7–7.7)
Neutrophils Relative %: 71 %
Platelets: 514 10*3/uL — ABNORMAL HIGH (ref 150–400)
RBC: 4.7 MIL/uL (ref 3.87–5.11)
RDW: 15.3 % (ref 11.5–15.5)
WBC: 16.7 10*3/uL — ABNORMAL HIGH (ref 4.0–10.5)
nRBC: 0 % (ref 0.0–0.2)

## 2018-08-25 LAB — I-STAT VENOUS BLOOD GAS, ED
Acid-base deficit: 6 mmol/L — ABNORMAL HIGH (ref 0.0–2.0)
Bicarbonate: 20.7 mmol/L (ref 20.0–28.0)
O2 Saturation: 32 %
TCO2: 22 mmol/L (ref 22–32)
pCO2, Ven: 42.3 mmHg — ABNORMAL LOW (ref 44.0–60.0)
pH, Ven: 7.299 (ref 7.250–7.430)
pO2, Ven: 22 mmHg — CL (ref 32.0–45.0)

## 2018-08-25 LAB — COMPREHENSIVE METABOLIC PANEL
ALT: 8 U/L (ref 0–44)
AST: 17 U/L (ref 15–41)
Albumin: 3.4 g/dL — ABNORMAL LOW (ref 3.5–5.0)
Alkaline Phosphatase: 69 U/L (ref 38–126)
Anion gap: 12 (ref 5–15)
BUN: 9 mg/dL (ref 6–20)
CO2: 16 mmol/L — ABNORMAL LOW (ref 22–32)
Calcium: 7.5 mg/dL — ABNORMAL LOW (ref 8.9–10.3)
Chloride: 112 mmol/L — ABNORMAL HIGH (ref 98–111)
Creatinine, Ser: 0.92 mg/dL (ref 0.44–1.00)
GFR calc Af Amer: >60 mL/min (ref >60)
GFR calc non Af Amer: >60 mL/min (ref >60)
Glucose, Bld: 98 mg/dL (ref 70–99)
Potassium: 3.7 mmol/L (ref 3.5–5.1)
Sodium: 140 mmol/L (ref 135–145)
Total Bilirubin: 0.5 mg/dL (ref 0.3–1.2)
Total Protein: 6.2 g/dL — ABNORMAL LOW (ref 6.5–8.1)

## 2018-08-25 LAB — URINALYSIS, ROUTINE W REFLEX MICROSCOPIC
Bilirubin Urine: NEGATIVE
Glucose, UA: NEGATIVE mg/dL
Hgb urine dipstick: NEGATIVE
Ketones, ur: NEGATIVE mg/dL
Leukocytes, UA: NEGATIVE
Nitrite: NEGATIVE
Protein, ur: NEGATIVE mg/dL
Specific Gravity, Urine: 1.004 — ABNORMAL LOW (ref 1.005–1.030)
pH: 6 (ref 5.0–8.0)

## 2018-08-25 LAB — POC URINE PREG, ED: Preg Test, Ur: NEGATIVE

## 2018-08-25 LAB — MAGNESIUM: Magnesium: 1.2 mg/dL — ABNORMAL LOW (ref 1.7–2.4)

## 2018-08-25 IMAGING — CT CT ABD-PELV W/ CM
2 of 4 series · 16 of 46 positions shown, 18 images · IV contrast (APPLIED)
Comparison: [DATE]

CLINICAL DATA: Left-sided abdominal pain for 2 months

EXAM:
CT ABDOMEN AND PELVIS WITH CONTRAST
TECHNIQUE: Multidetector CT imaging of the abdomen and pelvis was performed
using the standard protocol following bolus administration of
intravenous contrast.
CONTRAST:  100mL OMNIPAQUE IOHEXOL 300 MG/ML  SOLN

[Series 3: abdomen 5.0 · axial · 0.75mm/px · z∈[+796,+1226]mm · 13 of 98 slices shown, 15 images]
[im 6/98  soft-tissue]
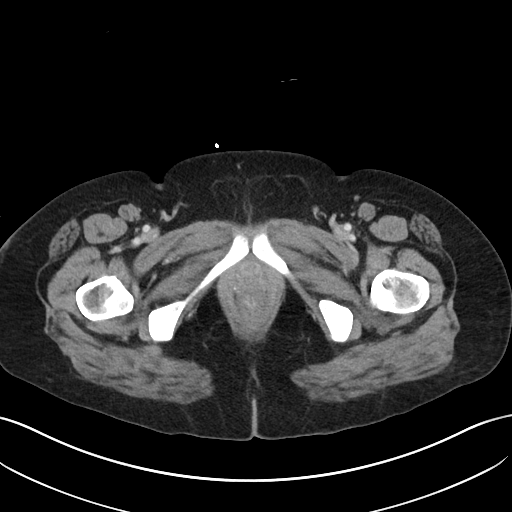
[im 6/98  bone]
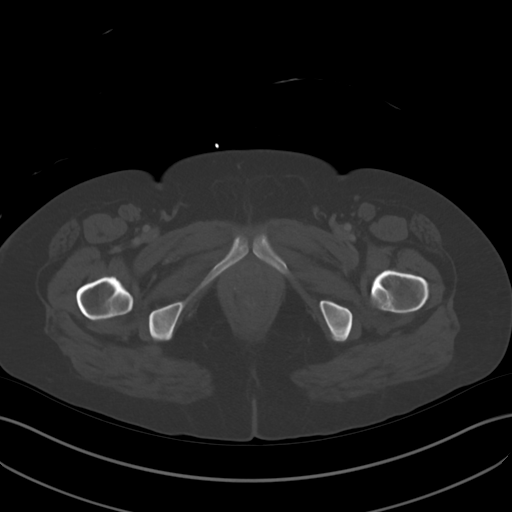
[im 12/98  soft-tissue]
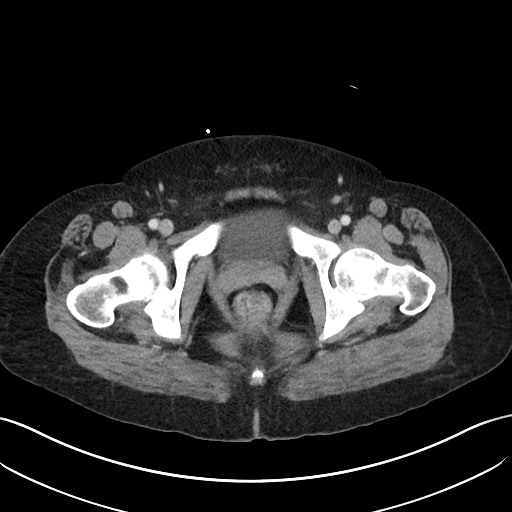
[im 23/98  soft-tissue]
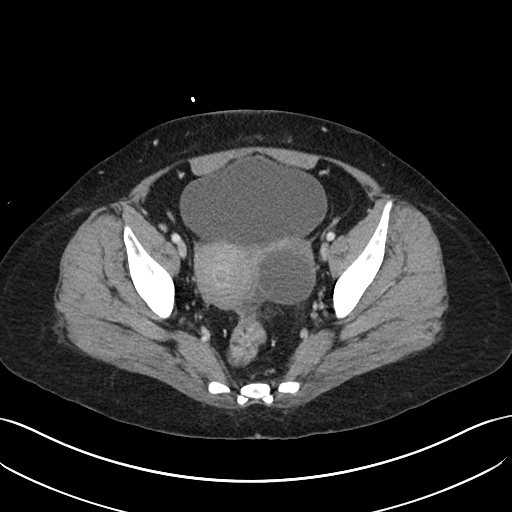
[im 29/98  soft-tissue]
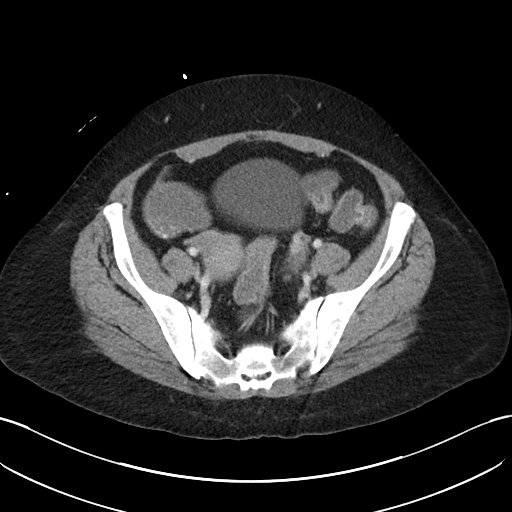
[im 35/98  soft-tissue]
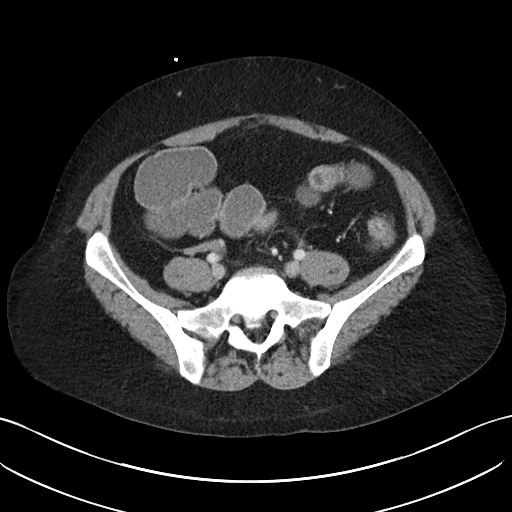
[im 40/98  soft-tissue]
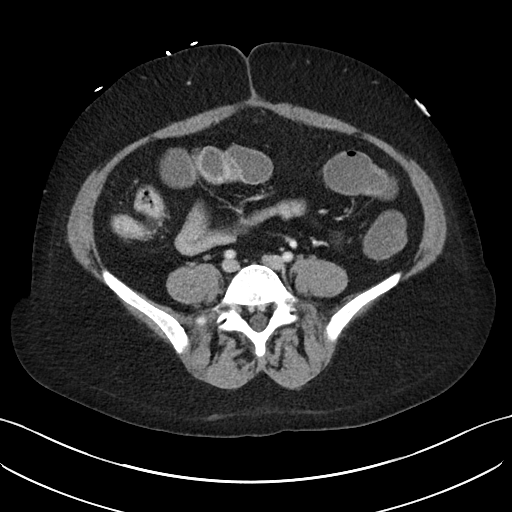
[im 52/98  soft-tissue]
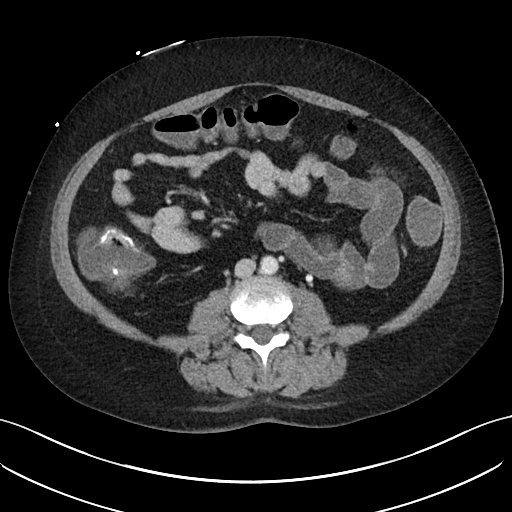
[im 58/98  soft-tissue]
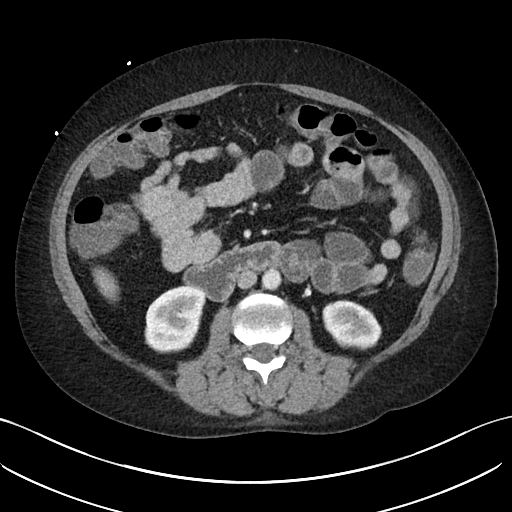
[im 63/98  soft-tissue]
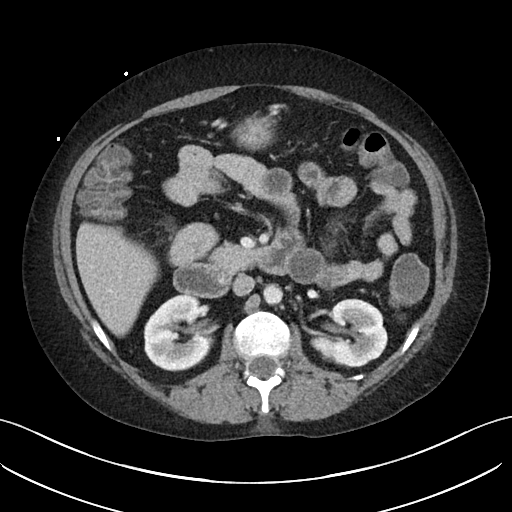
[im 63/98  bone]
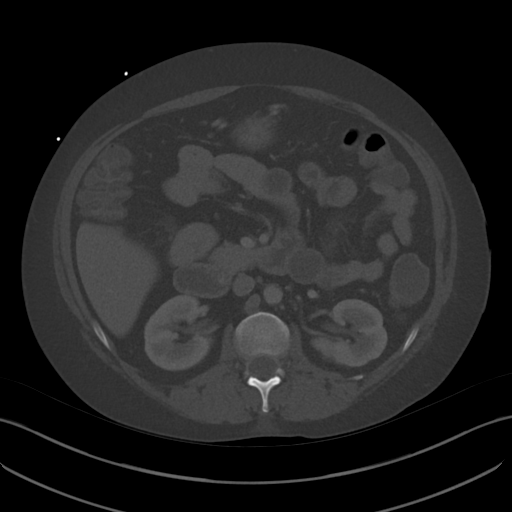
[im 69/98  soft-tissue]
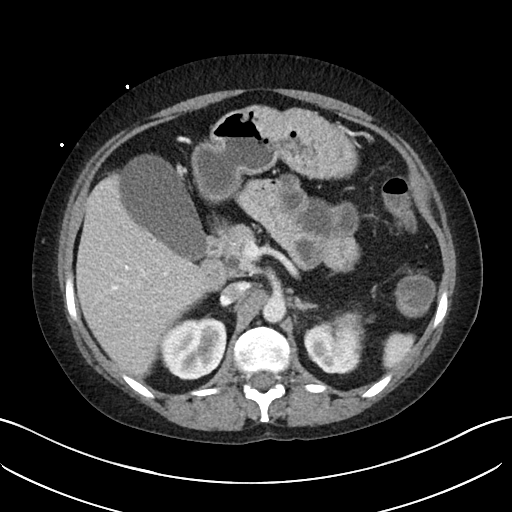
[im 75/98  soft-tissue]
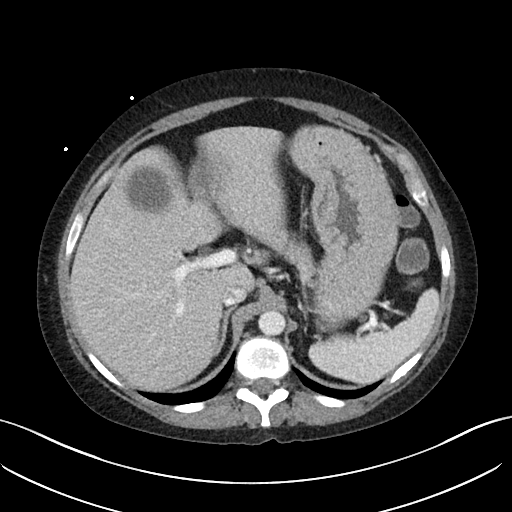
[im 86/98  soft-tissue]
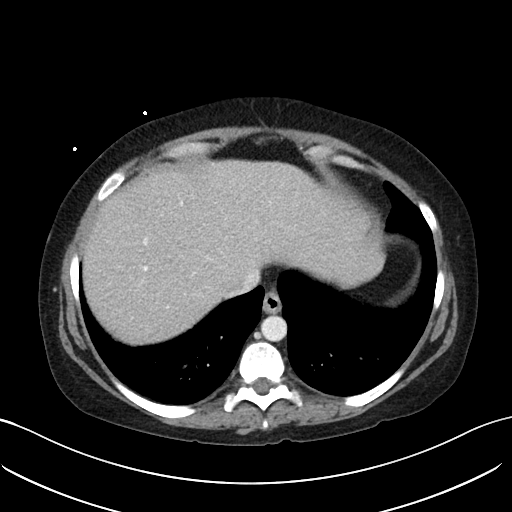
[im 92/98  soft-tissue]
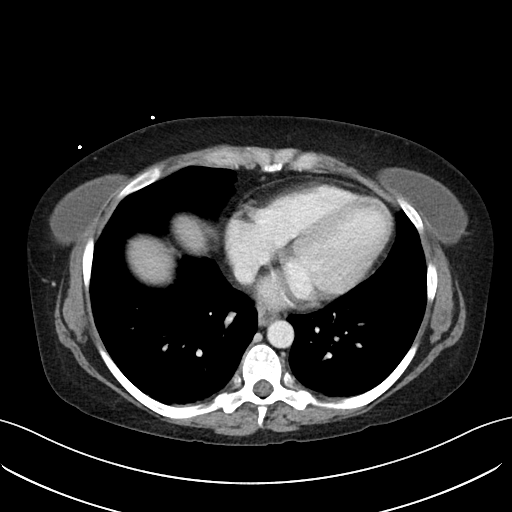

[Series 6: abdomen 3.0 mpr cor · coronal · 0.72mm/px · 3 of 87 slices shown]
[im 29/87  soft-tissue]
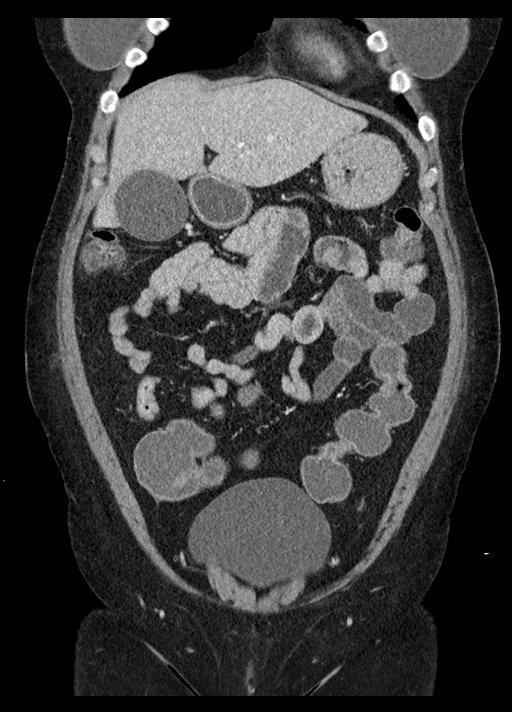
[im 39/87  soft-tissue]
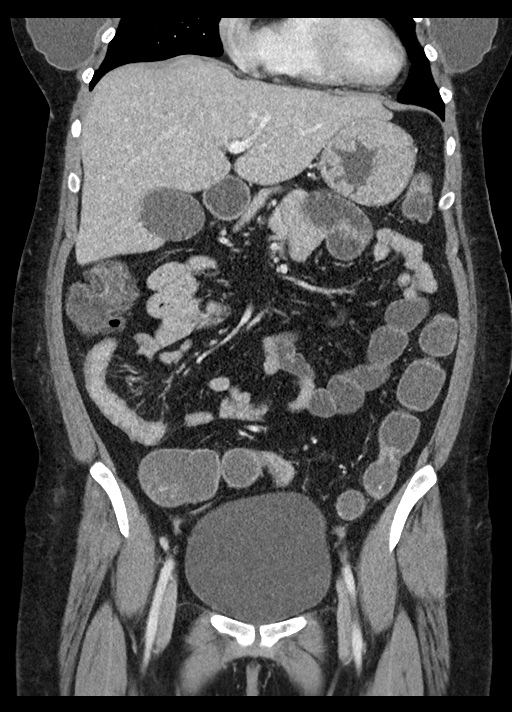
[im 48/87  soft-tissue]
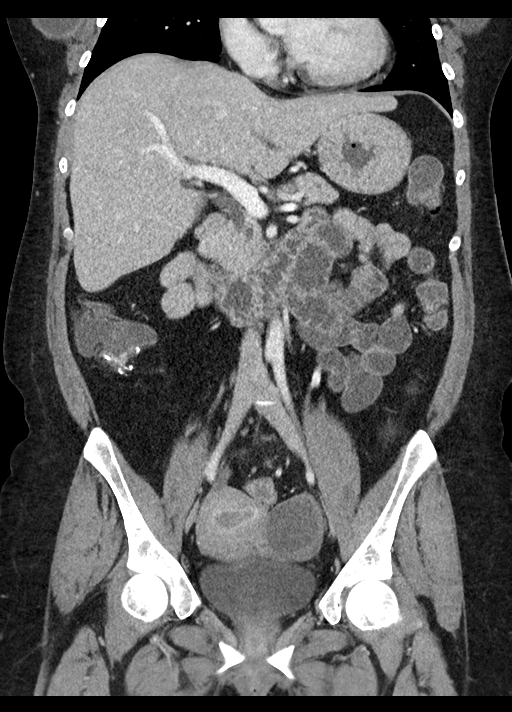

[16 of 46 positions shown; findings below may reference images not displayed]

FINDINGS: Lower chest: No acute abnormality.

Hepatobiliary: No focal liver abnormality is seen. No gallstones,
gallbladder wall thickening, or biliary dilatation.

Pancreas: Unremarkable. No pancreatic ductal dilatation or
surrounding inflammatory changes.

Spleen: Normal in size without focal abnormality.

Adrenals/Urinary Tract: Adrenal glands are unremarkable. Kidneys are
normal, without renal calculi, focal lesion, or hydronephrosis.
Bladder is unremarkable.

Stomach/Bowel: Stomach is within normal limits. No evidence of bowel
wall thickening, distention, or inflammatory changes. Fluid
throughout the colon as can be seen with diarrhea. Multiple
fluid-filled loops of small bowel.

Vascular/Lymphatic: No significant vascular findings are present. No
enlarged abdominal or pelvic lymph nodes.

Reproductive: Uterus is normal. 4.5 cm hypodense, fluid attenuating
left ovarian mass most consistent with a cyst.

Other: No abdominal wall hernia or abnormality. No abdominopelvic
ascites.

Musculoskeletal: No acute osseous abnormality. No aggressive osseous
lesion. Mild chronic T12 vertebral body height loss.
IMPRESSION: 1. Multiple fluid-filled loops of small bowel and fluid throughout
the colon as can be seen with enterocolitis.
2. Otherwise no acute abdominal or pelvic pathology.
3. Left ovarian cyst.

## 2018-08-25 MED ORDER — IOHEXOL 300 MG/ML  SOLN
100.0000 mL | Freq: Once | INTRAMUSCULAR | Status: AC | PRN
  Administered 2018-08-25: 100 mL via INTRAVENOUS

## 2018-08-25 MED ORDER — MORPHINE SULFATE (PF) 4 MG/ML IV SOLN
4.0000 mg | Freq: Once | INTRAVENOUS | Status: AC
Start: 2018-08-25 — End: 2018-08-25
  Administered 2018-08-25: 4 mg via INTRAVENOUS
  Filled 2018-08-25: qty 1

## 2018-08-25 MED ORDER — LACTATED RINGERS IV BOLUS
1000.0000 mL | Freq: Once | INTRAVENOUS | Status: AC
Start: 2018-08-25 — End: 2018-08-25
  Administered 2018-08-25: 1000 mL via INTRAVENOUS

## 2018-08-25 MED ORDER — MORPHINE SULFATE (PF) 4 MG/ML IV SOLN
4.0000 mg | Freq: Once | INTRAVENOUS | Status: AC
  Administered 2018-08-25: 4 mg via INTRAVENOUS
  Filled 2018-08-25: qty 1

## 2018-08-25 MED ORDER — MAGNESIUM SULFATE 2 GM/50ML IV SOLN
2.0000 g | Freq: Once | INTRAVENOUS | Status: AC
  Administered 2018-08-25: 2 g via INTRAVENOUS
  Filled 2018-08-25: qty 50

## 2018-08-25 MED ORDER — DICYCLOMINE HCL 10 MG PO CAPS
10.0000 mg | ORAL_CAPSULE | Freq: Once | ORAL | Status: AC
  Administered 2018-08-25: 10 mg via ORAL
  Filled 2018-08-25: qty 1

## 2018-08-25 MED ORDER — LIDOCAINE 5 % EX PTCH
1.0000 | MEDICATED_PATCH | CUTANEOUS | Status: DC
  Administered 2018-08-25: 1 via TRANSDERMAL
  Filled 2018-08-25: qty 1
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
## 2018-08-25 NOTE — ED Triage Notes (Signed)
C/o abd pain . Flank pain  Saw [facility] for chrons on Tuesday  Had labs drawn and was told that her mag was low and to come for infusion  That day or to come to [facility] then but she waited and now she is here,  Was given antibiotic rx,  Given mag rx and potassium rx  Started all of these meds but still having pain and swelling

## 2018-08-25 NOTE — ED Provider Notes (Signed)
Results for orders placed or performed during the hospital encounter of 08/25/18  Comprehensive metabolic panel  Result Value Ref Range   Sodium 140 135 - 145 mmol/L   Potassium 3.7 3.5 - 5.1 mmol/L   Chloride 112 (H) 98 - 111 mmol/L   CO2 16 (L) 22 - 32 mmol/L   Glucose, Bld 98 70 - 99 mg/dL   BUN 9 6 - 20 mg/dL   Creatinine, Ser 0.92 0.44 - 1.00 mg/dL   Calcium 7.5 (L) 8.9 - 10.3 mg/dL   Total Protein 6.2 (L) 6.5 - 8.1 g/dL   Albumin 3.4 (L) 3.5 - 5.0 g/dL   AST 17 15 - 41 U/L   ALT 8 0 - 44 U/L   Alkaline Phosphatase 69 38 - 126 U/L   Total Bilirubin 0.5 0.3 - 1.2 mg/dL   GFR calc non Af Amer >60 >60 mL/min   GFR calc Af Amer >60 >60 mL/min   Anion gap 12 5 - 15  CBC with Differential  Result Value Ref Range   WBC 16.7 (H) 4.0 - 10.5 K/uL   RBC 4.70 3.87 - 5.11 MIL/uL   Hemoglobin 13.1 12.0 - 15.0 g/dL   HCT 42.9 36.0 - 46.0 %   MCV 91.3 80.0 - 100.0 fL   MCH 27.9 26.0 - 34.0 pg   MCHC 30.5 30.0 - 36.0 g/dL   RDW 15.3 11.5 - 15.5 %   Platelets 514 (H) 150 - 400 K/uL   nRBC 0.0 0.0 - 0.2 %   Neutrophils Relative % 71 %   Neutro Abs 11.9 (H) 1.7 - 7.7 K/uL   Lymphocytes Relative 19 %   Lymphs Abs 3.1 0.7 - 4.0 K/uL   Monocytes Relative 6 %   Monocytes Absolute 1.0 0.1 - 1.0 K/uL   Eosinophils Relative 2 %   Eosinophils Absolute 0.3 0.0 - 0.5 K/uL   Basophils Relative 1 %   Basophils Absolute 0.1 0.0 - 0.1 K/uL   Immature Granulocytes 1 %   Abs Immature Granulocytes 0.23 (H) 0.00 - 0.07 K/uL  Urinalysis, Routine w reflex microscopic  Result Value Ref Range   Color, Urine STRAW (A) YELLOW   APPearance HAZY (A) CLEAR   Specific Gravity, Urine 1.004 (L) 1.005 - 1.030   pH 6.0 5.0 - 8.0   Glucose, UA NEGATIVE NEGATIVE mg/dL   Hgb urine dipstick NEGATIVE NEGATIVE   Bilirubin Urine NEGATIVE NEGATIVE   Ketones, ur NEGATIVE NEGATIVE mg/dL   Protein, ur NEGATIVE NEGATIVE mg/dL   Nitrite NEGATIVE NEGATIVE   Leukocytes, UA NEGATIVE NEGATIVE  Magnesium  Result Value Ref  Range   Magnesium 1.2 (L) 1.7 - 2.4 mg/dL  I-Stat venous blood gas, ED  Result Value Ref Range   pH, Ven 7.299 7.250 - 7.430   pCO2, Ven 42.3 (L) 44.0 - 60.0 mmHg   pO2, Ven 22.0 (LL) 32.0 - 45.0 mmHg   Bicarbonate 20.7 20.0 - 28.0 mmol/L   TCO2 22 22 - 32 mmol/L   O2 Saturation 32.0 %   Acid-base deficit 6.0 (H) 0.0 - 2.0 mmol/L   Patient temperature HIDE    Sample type VENOUS    Comment NOTIFIED PHYSICIAN   POC urine preg, ED  Result Value Ref Range   Preg Test, Ur NEGATIVE NEGATIVE   Ct Abdomen Pelvis W Contrast  Result Date: 08/25/2018 CLINICAL DATA:  Left-sided abdominal pain for 2 months EXAM: CT ABDOMEN AND PELVIS WITH CONTRAST TECHNIQUE: Multidetector CT imaging of the abdomen and pelvis  was performed using the standard protocol following bolus administration of intravenous contrast. CONTRAST:  150mL OMNIPAQUE IOHEXOL 300 MG/ML  SOLN COMPARISON:  12/07/2017 FINDINGS: Lower chest: No acute abnormality. Hepatobiliary: No focal liver abnormality is seen. No gallstones, gallbladder wall thickening, or biliary dilatation. Pancreas: Unremarkable. No pancreatic ductal dilatation or surrounding inflammatory changes. Spleen: Normal in size without focal abnormality. Adrenals/Urinary Tract: Adrenal glands are unremarkable. Kidneys are normal, without renal calculi, focal lesion, or hydronephrosis. Bladder is unremarkable. Stomach/Bowel: Stomach is within normal limits. No evidence of bowel wall thickening, distention, or inflammatory changes. Fluid throughout the colon as can be seen with diarrhea. Multiple fluid-filled loops of small bowel. Vascular/Lymphatic: No significant vascular findings are present. No enlarged abdominal or pelvic lymph nodes. Reproductive: Uterus is normal. 4.5 cm hypodense, fluid attenuating left ovarian mass most consistent with a cyst. Other: No abdominal wall hernia or abnormality. No abdominopelvic ascites. Musculoskeletal: No acute osseous abnormality. No aggressive  osseous lesion. Mild chronic T12 vertebral body height loss. IMPRESSION: 1. Multiple fluid-filled loops of small bowel and fluid throughout the colon as can be seen with enterocolitis. 2. Otherwise no acute abdominal or pelvic pathology. 3. Left ovarian cyst. Electronically Signed   By: Kathreen Devoid   On: 08/25/2018 18:00    Patient presents with low magnesium level and worsening of some chronic left side abdominal pain.  CT scan does not show any significant acute abnormality.  She has chronic diarrhea which would explain the fluid-filled loops of bowel but there is no signs of wall thickening or obstruction.  She was given magnesium replacement in the ED.  She has oral supplementation to take at home.  She was instructed to follow-up with her physician.  Return precautions were given.   Malvin Johns, MD 08/25/18 [phone]

## 2018-08-26 ENCOUNTER — Encounter (HOSPITAL_COMMUNITY): Payer: Self-pay | Admitting: Emergency Medicine

## 2018-08-26 ENCOUNTER — Ambulatory Visit (HOSPITAL_COMMUNITY)
Admission: EM | Admit: 2018-08-26 | Discharge: 2018-08-26 | Disposition: A | Discharge status: Home / Self Care | Payer: Medicaid Other | Attending: Internal Medicine | Admitting: Internal Medicine

## 2018-08-26 ENCOUNTER — Other Ambulatory Visit: Payer: Self-pay

## 2018-08-26 DIAGNOSIS — R1010 Upper abdominal pain, unspecified: Secondary | ICD-10-CM | POA: Diagnosis not present

## 2018-08-26 MED ORDER — CIPROFLOXACIN HCL 500 MG PO TABS: 500.0000 mg | ORAL_TABLET | Freq: Two times a day (BID) | ORAL | 0 refills | Status: AC

## 2018-08-26 NOTE — ED Triage Notes (Signed)
Patient seen at [facility] yesterday.  Patient has left side torso pain.  Patient reports being seen and told no blockages, but has not been told anything else about what is wrong.  Pain with movement of torso.  Patient has no change in bowel routine.  No vomiting
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
## 2018-08-29 ENCOUNTER — Ambulatory Visit: Admit: 2018-08-29 | Discharge: 2018-08-30 | Disposition: A | Discharge status: Home / Self Care | Payer: MEDICAID

## 2018-08-29 ENCOUNTER — Emergency Department: Admit: 2018-08-29 | Discharge: 2018-08-30 | Disposition: A | Discharge status: Home / Self Care | Payer: MEDICAID

## 2018-08-29 LAB — PREGNANCY TEST URINE: Lab: NEGATIVE

## 2018-08-29 LAB — COMPREHENSIVE METABOLIC PANEL
ALBUMIN: 4.1 g/dL (ref 3.5–5.0)
ALKALINE PHOSPHATASE: 85 U/L (ref 38–126)
ALT (SGPT): 11 U/L (ref <35)
ANION GAP: 12 mmol/L (ref 7–15)
AST (SGOT): 29 U/L (ref 14–38)
BILIRUBIN TOTAL: 0.3 mg/dL (ref 0.0–1.2)
BLOOD UREA NITROGEN: 10 mg/dL (ref 7–21)
BUN / CREAT RATIO: 11
CHLORIDE: 111 mmol/L — ABNORMAL HIGH (ref 98–107)
CO2: 14 mmol/L — ABNORMAL LOW (ref 22.0–30.0)
CREATININE: 0.87 mg/dL (ref 0.60–1.00)
EGFR CKD-EPI AA FEMALE: >90 mL/min/{1.73_m2} (ref >=60)
EGFR CKD-EPI NON-AA FEMALE: 85 mL/min/{1.73_m2} (ref >=60)
GLUCOSE RANDOM: 103 mg/dL (ref 70–179)
POTASSIUM: 4.3 mmol/L (ref 3.5–5.0)
PROTEIN TOTAL: 7.1 g/dL (ref 6.5–8.3)
SODIUM: 137 mmol/L (ref 135–145)

## 2018-08-29 LAB — CBC W/ AUTO DIFF
BASOPHILS ABSOLUTE COUNT: 0.1 10*9/L (ref 0.0–0.1)
BASOPHILS RELATIVE PERCENT: 0.5 %
EOSINOPHILS ABSOLUTE COUNT: 0.3 10*9/L (ref 0.0–0.4)
EOSINOPHILS RELATIVE PERCENT: 1.4 %
HEMOGLOBIN: 14 g/dL (ref 12.0–16.0)
LARGE UNSTAINED CELLS: 2 % (ref 0–4)
LYMPHOCYTES ABSOLUTE COUNT: 3.8 10*9/L (ref 1.5–5.0)
MEAN CORPUSCULAR HEMOGLOBIN CONC: 30.9 g/dL — ABNORMAL LOW (ref 31.0–37.0)
MEAN CORPUSCULAR HEMOGLOBIN: 28.8 pg (ref 26.0–34.0)
MEAN CORPUSCULAR VOLUME: 93.3 fL (ref 80.0–100.0)
MEAN PLATELET VOLUME: 7.3 fL (ref 7.0–10.0)
MONOCYTES ABSOLUTE COUNT: 0.6 10*9/L (ref 0.2–0.8)
MONOCYTES RELATIVE PERCENT: 3.3 %
PLATELET COUNT: 695 10*9/L — ABNORMAL HIGH (ref 150–440)
RED BLOOD CELL COUNT: 4.87 10*12/L (ref 4.00–5.20)
RED CELL DISTRIBUTION WIDTH: 15.6 % — ABNORMAL HIGH (ref 12.0–15.0)
WBC ADJUSTED: 19.1 10*9/L — ABNORMAL HIGH (ref 4.5–11.0)

## 2018-08-29 LAB — URINALYSIS WITH CULTURE REFLEX
BILIRUBIN UA: NEGATIVE
BLOOD UA: NEGATIVE
GLUCOSE UA: NEGATIVE
NITRITE UA: NEGATIVE
PROTEIN UA: 30 — AB
RBC UA: 3 /HPF (ref <=4)
SPECIFIC GRAVITY UA: 1.028 (ref 1.003–1.030)
SQUAMOUS EPITHELIAL: 12 /HPF — ABNORMAL HIGH (ref 0–5)
UROBILINOGEN UA: 0.2

## 2018-08-29 LAB — ALKALINE PHOSPHATASE: Alkaline phosphatase:CCnc:Pt:Ser/Plas:Qn:: 85

## 2018-08-29 LAB — C-REACTIVE PROTEIN: C reactive protein:MCnc:Pt:Ser/Plas:Qn:: 9.7

## 2018-08-29 LAB — TOXIC VACUOLATION

## 2018-08-29 LAB — ERYTHROCYTE SEDIMENTATION RATE: Lab: 6

## 2018-08-29 LAB — PROTIME: Lab: 10.7

## 2018-08-29 LAB — LIPASE: Triacylglycerol lipase:CCnc:Pt:Ser/Plas:Qn:: 83

## 2018-08-29 LAB — SLIDE REVIEW

## 2018-08-29 LAB — WBC ADJUSTED: Lab: 19.1 — ABNORMAL HIGH

## 2018-08-29 LAB — PROTIME-INR: INR: 0.93

## 2018-08-29 LAB — TROPONIN I: Troponin I.cardiac:MCnc:Pt:Ser/Plas:Qn:: <0.034

## 2018-08-29 LAB — UROBILINOGEN UA: Lab: 0.2

## 2018-08-30 DIAGNOSIS — R1012 Left upper quadrant pain: Principal | ICD-10-CM

## 2018-08-30 LAB — VITAMIN B-12: Cobalamins:MCnc:Pt:Ser/Plas:Qn:: 768

## 2018-08-30 LAB — MAGNESIUM: Magnesium:MCnc:Pt:Ser/Plas:Qn:: 1.4 — ABNORMAL LOW

## 2018-08-30 LAB — PHOSPHORUS: Phosphate:MCnc:Pt:Ser/Plas:Qn:: 2.6 — ABNORMAL LOW

## 2018-08-30 MED ORDER — ONDANSETRON 4 MG DISINTEGRATING TABLET
ORAL_TABLET | Freq: Two times a day (BID) | ORAL | 0 refills | 0.00000 days | Status: CP | PRN
Start: 2018-08-30 — End: 2018-09-13

## 2018-08-30 MED ORDER — BUDESONIDE DR - ER 3 MG CAPSULE,DELAYED,EXTENDED RELEASE
ORAL_CAPSULE | 0 refills | 0 days | Status: CP
Start: 2018-08-30 — End: 2018-09-07

## 2018-08-30 MED ORDER — OXYCODONE 10 MG TABLET
ORAL_TABLET | ORAL | 0 refills | 0.00000 days | PRN
Start: 2018-08-30 — End: 2018-09-04

## 2018-08-30 MED ORDER — DULOXETINE 60 MG CAPSULE,DELAYED RELEASE
ORAL_CAPSULE | Freq: Two times a day (BID) | ORAL | 0 refills | 0.00000 days | Status: CP
Start: 2018-08-30 — End: 2018-10-11

## 2018-08-30 MED ORDER — COLESTIPOL 1 GRAM TABLET
ORAL_TABLET | Freq: Two times a day (BID) | ORAL | 0 refills | 0 days | Status: CP
Start: 2018-08-30 — End: 2018-11-06

## 2018-08-30 NOTE — Unmapped (Signed)
Report called to jess, pt taken to Team D room # 66

## 2018-08-30 NOTE — Unmapped (Signed)
Pt. resting with eyes closed on stretcher. RR even and unlabored. Chest rise symmetrical and bilateral. NAD noted. Bed locked and in lowest position. Call bell within reach. Patient belongings at bedside. No other needs identified.

## 2018-08-30 NOTE — Unmapped (Signed)
Pt returned from CT scan.

## 2018-08-30 NOTE — Unmapped (Signed)
Medicine History and Physical    Assessment/Plan:      Mary Hawkins is a 38 y.o. female with PMHx of Crohn's disease (dx 2010) s/p ileocecal resection currently off maintenance therapy who presents with LUQ abdominal pain, found to CT imaging findings c/w Crohn's flare.     LUQ pain: Likely secondary to Crohn's flare given off maintenance therapies, worsening of diarrhea and n/v. CT without evidence of nephrolithiasis, UA does not suggest UTI, CT shows thickening of her 'neoileum.' CRP 9.7. C diff negative in clinic earlier this month but given worsened diarrhea will repeat, as well as GI pathogen panel. Plan for clear liquid diet today in anticipation of colonoscopy.   - clear liquid diet  - consider GoLytely prep if GI able to scope tomorrow  - trend daily CBC, CMP, Mg, Phos, CRP  - C diff, GI pathogen panel  - scheduled tylenol, PRN oxycodone 5mg , PRN dilaudid .5mg    - LR @ 100/hr x 10hrs    Crohn's disease: Diagnosed in 2010, sticturing crohns initially treated with Humira, Entocort. Underwent resection 4/15. Had admission to [facility] for ARDS thought 2/2 rhinovirus requiring mechanical ventilation. Patient started on ustekinumab following this but stopped this herself 9/19 due to weight gain. Scheduled to restart 1/24.   - continue protonix 40mg  BID  - continue home colestipol 4mg  daily  - continue home klor-con 20mg  daily    Concern for SIBO: S/p 2 weeks of Flagyl, one day of ciprofloxacin. Patient reports no improvement in her symptoms of abdominal pain and bloating.     Iron deficiency anemia: Patient with history of this, iron recently 27, Hgb 14 today.   - daily CBC  - consider IV iron if decreasing Hgb    Depression:   - continue home cymbalta DR 60mg   ___________________________________________________________________    Chief Complaint:  Chief Complaint   Patient presents with   ??? Abdominal Pain     HPI:  Mary Hawkins is a 38 y.o. female with PMH Crohn's diease s/p ileocecal resection 4/15 who recently reestablished care with [facility] who presents with persistent LUQ pain.     She reports that the intermittent pain she was experiencing in her LUQ at her last clinic visit with Dr [name] has gotten worse and become constant. It bothers her most if she moves or lays on it. She has had decreased appetite, nausea, vomiting, and worsened diarrhea that has recently caused her fecal incontinence. She denies blood in stool or vomit. No fevers or chills. No recent weight loss. No sick contacts.     She reports taking her medicines as prescribed - has increased her colestipol to 6g to try and help with her loose stools. Is scheduled for estekinumab and iron infusion this Friday - has not yet restarted estekinumab after stopped it this fall due to weight gain. She states she just completed the course of Flagyl but has not noticed an improvement in her bloating. An OSH ED prescribed cipro but she only took one dose and stopped, due to concern over side effects.     Advanced care planning: She identifies her mother, Cheyenne Adas, as her HCPOA.     Allergies:  Penicillins and Doxycycline hcl    Medications:   Prior to Admission medications    Medication Dose, Route, Frequency   acetaminophen (TYLENOL) 500 MG tablet 1,000 mg, Oral, Every 6 hours PRN   aluminum-magnesium hydroxide-simethicone (MAALOX PLUS) 200-200-20 mg/5 mL Susp TAKE BY MOUTH EVERY 6 HOURS AS  NEEDED  Patient not taking: Reported on 08/14/2018   colestipol (COLESTID) 1 gram tablet 4 g, Oral, 2 times a day (standard)   cyanocobalamin 1,000 mcg/mL injection 1 mL, Subcutaneous, Every 30 days   DULoxetine (CYMBALTA) 60 MG capsule 60 mg, Oral, Daily (standard), Take 1 capsule daily x 1 week, then 2 capsules daily thereafter.   empty container Misc Use as directed   magnesium oxide (MAG-OX) 400 mg (241.3 mg magnesium) tablet 800 mg, Oral, 2 times a day (standard)   metoprolol succinate (TOPROL-XL) 50 MG 24 hr tablet 50 mg, Oral, Daily (standard) multivitamin therapeutic with minerals (THERA-M) 27-0.4 mg Tab 1 tablet, Oral, Daily (standard), With iron and calcium    ondansetron (ZOFRAN) 4 MG tablet 4 mg, Oral, Every 8 hours PRN   pantoprazole (PROTONIX) 40 MG tablet 40 mg, Oral, 2 times a day (standard)   potassium chloride SA (KLOR-CON) 20 MEQ tablet 20 mEq, Oral, Daily (standard)   syringe with needle, safety (BD SAFETY-LOK DETACHABLE NEEDL) 3 mL 25 gauge x 5/8 Syrg 1 each, Miscellaneous, Every 30 days   ustekinumab (STELARA) 90 mg/mL Syrg syringe INJECT THE CONTENTS OF 1 SYRINGE (90MG ) UNDER THE SKIN EVERY 8 WEEKS  Patient not taking: Reported on 08/14/2018   ustekinumab (STELARA) 90 mg/mL Syrg syringe Subcutaneous  Patient not taking: Reported on 08/14/2018       Medical History:  Past Medical History:   Diagnosis Date   ??? Anxiety    ??? Arthritis    ??? Back pain    ??? Crohn disease (CMS-HCC)    ??? Eczema    ??? Endometriosis    ??? GERD (gastroesophageal reflux disease)    ??? Headache(784.0)    ??? Hypertension    ??? Ulcerative colitis (CMS-HCC)        Surgical History:  Past Surgical History:   Procedure Laterality Date   ??? BREAST RECONSTRUCTION  2004   ??? ENDOMETRIAL ABLATION     ??? PR COLONOSCOPY FLX DX W/COLLJ SPEC WHEN PFRMD N/A 11/12/2013    Procedure: COLONOSCOPY, FLEXIBLE, PROXIMAL TO SPLENIC FLEXURE; DIAGNOSTIC, W/WO COLLECTION SPECIMEN BY BRUSH OR WASH;  Surgeon: Teodoro Spray, MD;  [facility];  Service: Gastroenterology   ??? PR COLONOSCOPY FLX DX W/COLLJ SPEC WHEN PFRMD N/A 02/20/2014    Procedure: COLONOSCOPY, FLEXIBLE, PROXIMAL TO SPLENIC FLEXURE; DIAGNOSTIC, W/WO COLLECTION SPECIMEN BY BRUSH OR WASH;  Surgeon: Billie Ruddy, MD;  Location: [facility];  Service: Gastroenterology   ??? PR COLONOSCOPY FLX DX W/COLLJ SPEC WHEN PFRMD N/A 12/27/2017    Procedure: COLONOSCOPY, FLEXIBLE, PROXIMAL TO SPLENIC FLEXURE; DIAGNOSTIC, W/WO COLLECTION SPECIMEN BY BRUSH OR WASH;  Surgeon: Bronson Curb, MD;  [facility]: [facility];  Service: Gastroenterology   ??? PR REMVL COLON & TERM ILEUM W/ILEOCOLOSTOMY N/A 11/13/2013    Procedure: Ileocecectomy, meckel's diverticulectomy, mesh rectopexy;  Surgeon: Emilia Beck, MD;  Location: MAIN OR [place];  Service: Gastrointestinal   ??? PR UPPER GI ENDOSCOPY,BIOPSY N/A 11/12/2013    Procedure: UGI ENDOSCOPY; WITH BIOPSY, SINGLE OR MULTIPLE;  Surgeon: Teodoro Spray, MD;  [facility];  Service: Gastroenterology   ??? PR UPPER GI ENDOSCOPY,BIOPSY N/A 12/27/2017    Procedure: UGI ENDOSCOPY; WITH BIOPSY, SINGLE OR MULTIPLE;  Surgeon: Bronson Curb, MD;  [facility]: [facility];  Service: Gastroenterology   ??? RECTAL PROLAPSE REPAIR         Social History:  Social History  Socioeconomic History   ??? Marital status: Single     Spouse name: Not on file   ??? Number of children: Not on file   ??? Years of education: Not on file   ??? Highest education level: Not on file   Occupational History   ??? Not on file   Social Needs   ??? Financial resource strain: Not on file   ??? Food insecurity:     Worry: Not on file     Inability: Not on file   ??? Transportation needs:     Medical: Not on file     Non-medical: Not on file   Tobacco Use   ??? Smoking status: Current Every Day Smoker     Packs/day: 1.00     Types: Cigarettes     Last attempt to quit: 12/06/2017     Years since quitting: 0.7   ??? Smokeless tobacco: Never Used   Substance and Sexual Activity   ??? Alcohol use: Yes     Comment: socially   ??? Drug use: No   ??? Sexual activity: Not on file   Lifestyle   ??? Physical activity:     Days per week: Not on file     Minutes per session: Not on file   ??? Stress: Not on file   Relationships   ??? Social connections:     Talks on phone: Not on file     Gets together: Not on file     Attends religious service: Not on file     Active member of club or organization: Not on file     Attends meetings of clubs or organizations: Not on file     Relationship status: Not on file   Other Topics Concern   ??? Not on file   Social History Narrative    Currently unemployed.  She is a 99-year-old son which takes care of.       Family History:  Family History   Problem Relation Age of Onset   ??? Esophageal cancer Maternal Grandfather    ??? Cancer Maternal Grandfather         Gastric cancer       Review of Systems:  10 systems reviewed and are negative unless otherwise mentioned in HPI    Labs/Studies:  Labs and Studies from the last 24hrs per EMR and Reviewed    Physical Exam:  Temp:  [36.9 ??C] 36.9 ??C  Heart Rate:  [90-125] 90  SpO2 Pulse:  [90] 90  Resp:  [18] 18  BP: (123-148)/(93-114) 123/93  SpO2:  [95 %-100 %] 96 %    Gen: Uncomfortable appearing adult lying back on ED stretcher, winces with movements  HEENT: No icterus, EOMI, PERRL  CV: RRR normal S1 and S2  Pulm: Clear to auscultation bilaterally without crackles or wheezes  Abd: Soft, TTP in LUQ, otherwise nontender, no rebound or guarding  Extremities: Warm without edema  Skin: No rash

## 2018-08-30 NOTE — Unmapped (Addendum)
Patient rounds complete. Airway intact, breathing even and unlabored and color appropriate for ethnicity. Pt in NAD. Pt says abdominal pain is still there. The following needs have been addressed: stretcher low and locked, call light within reach, pt belongings addressed, pain, and toileting. Will continue to monitor and wait for further orders from LIP.

## 2018-08-30 NOTE — Unmapped (Signed)
Pt in hallway, alert, mild distress. Pt still reports pain 10/10

## 2018-08-30 NOTE — Unmapped (Signed)
Patient rounds completed. The following patient needs were addressed:  Pain, Toileting, Personal Belongings, Plan of Care, Call Bell in Reach and Bed Position Low .

## 2018-08-30 NOTE — Unmapped (Signed)
Pt reports left flank pain for a few month, getting worse. Also c/o abd distention.

## 2018-08-30 NOTE — Unmapped (Signed)
Pt awake  Alert, nad. Pt reports she still has pain 10/10 pain. Pt advised that she has received multiple narcotic meds and too soon to give meds, will update. Pt awaiting a house bed.

## 2018-08-30 NOTE — Unmapped (Signed)
Report given to to [facility].

## 2018-08-30 NOTE — Unmapped (Signed)
[place] cheese on white bread, lettuce, tomato, mayo, mustard, mashed potatoes, butter, salt/pepper, coca cola, mellow yellow, salad with ranch dressing, extra ranch, shredded cheese, bacon bits,

## 2018-08-30 NOTE — Unmapped (Signed)
Pt upset over bed situation. Pt asked, states that she would like to speak w someone in patient relations. Pt relations callled.

## 2018-08-30 NOTE — Unmapped (Signed)
Patient rounds complete. Airway intact, breathing even and unlabored and color appropriate for ethnicity. Pt still complaining of L sided abdominal pain. The following needs have been addressed: stretcher low and locked, call light within reach, pt belongings addressed, pain, and toileting. Will continue to monitor and wait for further orders from LIP

## 2018-08-30 NOTE — Unmapped (Signed)
Pt resting on stretcher, no acute distress noted. Respirations even and unlabored, call bell and pt belongings within reach. Side rails up x2. Will continue to reasses.

## 2018-08-30 NOTE — Unmapped (Signed)
Patient rounds completed. The following patient needs were addressed:  Pain, Positioning;   SUPINE, Plan of Care, Call Bell in Reach and Bed Position Low .

## 2018-08-30 NOTE — Unmapped (Signed)
Consult w patient relations= will be down to see pt.

## 2018-08-30 NOTE — Unmapped (Signed)
[facility]       ED Clinical Impression     Final diagnoses:   Left upper quadrant pain (Primary)        Impression, ED Course, Assessment and Plan     Impression: 38 year old female with Crohn's disease presenting with what is likely a Crohn's flare.  She has had several days of abdominal distention and worsening abdominal pain.  She has had significant diarrhea over the last several weeks, and has had no nausea or vomiting, so I do not suspect SBO at this time.  Additionally would consider intra-abdominal infection or abscess formation.  Less likely to be nephrolithiasis.  Her heart score is 0 making ACS unlikely given her ECG and normal troponin.    ED Course as of Aug 30 645   Wed Aug 29, 2018   2222 WBC(!): 19.1   2222 Platelet(!): 695   2222 Lipase: 83   2223 PT: 10.7   2223 INR: 0.93   2223 Sodium: 137   2223 Potassium: 4.3   2223 Creatinine: 0.87   Thu Aug 30, 2018   0014 CRP: 9.7   0014 Troponin I: <0.034   0014 Preg Test, Ur: Negative   0014 Sed Rate: 6   0606 --Sequelae of ileocecectomy with bowel wall thickening inflammatory changes at the neoileum extending approximately 15 to 20 cm, consistent with patient history of Crohn's disease. No evidence of fistula or abscess.  ??  --5.8 cm left adnexal multiseptated ovarian cystic lesion. Recommend further evaluation with nonemergent ultrasound evaluation in 6-8 weeks to ensure resolution.   CT Enterography Abdomen/Pelvis w contrast   1610 Initial laboratory work-up unremarkable for Crohn's flare.  She does have an elevated white count with elevated platelets, as well as some bowel wall thickening and inflammatory changes at the neo-ileum.  Based on this, will admit for pain management and likely GI consult.  Patient has required multiple doses of morphine as well as Dilaudid here in the emergency department with minimal improvement in her symptoms.  She has been stable but uncomfortable in the ED.          ____________________________________________ I have reviewed the triage vital signs and the nursing notes during the course of this stay.  I reviewed the patient's prior medical records.  I discussed the case and plan for continuity of care with the admitting provider.  I discussed the case with Dr. Ezekiel Slocumb.      History     Chief Complaint  Abdominal Pain      HPI   Mary Hawkins is a 38 y.o. female with past medical history of Crohn's disease who presents with worsening of left-sided flank and abdominal pain over the last 4-5 days.  She was seen at an outside hospital and had a negative CT and laboratory work-up on Saturday, but says that her abdominal pain is been worsened since then.  Additionally she has had worsening distention over the last several days.  She has had no fevers or chills, no vomiting or nausea, no shortness of breath.  She says her pain is most severe over the left side of her back and into the left abdomen.  It extends up under her rib cage, and is not positional.  It is worsened with movement.  She has not taken anything to relieve this pain.  Additionally she has had diarrhea over the past 1-2 weeks, she was seen for this and had a negative C. difficile work-up on 1/7 and was started  on metronidazole at that time.  She has had improvement of her diarrhea after starting Imodium on Tuesday.  She has had no dysuria.    Review of Systems  Constitutional: Negative for fever.  Eyes: Negative for visual changes.  ENT: Negative for sore throat.  CV: Negative for chest pain.  Resp: Negative for shortness of breath.  GI: + For abdominal pain.  GU: Negative for dysuria.  MSK: Negative for back pain.  Derm: Negative for rash.  Neuro: Negative for headaches.        Past Medical History:   Diagnosis Date   ??? Anxiety    ??? Arthritis    ??? Back pain    ??? Crohn disease (CMS-HCC)    ??? Eczema    ??? Endometriosis    ??? GERD (gastroesophageal reflux disease)    ??? Headache(784.0)    ??? Hypertension    ??? Ulcerative colitis (CMS-HCC)        Past Surgical History:   Procedure Laterality Date   ??? BREAST RECONSTRUCTION  2004   ??? ENDOMETRIAL ABLATION     ??? PR COLONOSCOPY FLX DX W/COLLJ SPEC WHEN PFRMD N/A 11/12/2013    Procedure: COLONOSCOPY, FLEXIBLE, PROXIMAL TO SPLENIC FLEXURE; DIAGNOSTIC, W/WO COLLECTION SPECIMEN BY BRUSH OR WASH;  Surgeon: Teodoro Spray, MD;  Location: [facility];  Service: Gastroenterology   ??? PR COLONOSCOPY FLX DX W/COLLJ SPEC WHEN PFRMD N/A 02/20/2014    Procedure: COLONOSCOPY, FLEXIBLE, PROXIMAL TO SPLENIC FLEXURE; DIAGNOSTIC, W/WO COLLECTION SPECIMEN BY BRUSH OR WASH;  Surgeon: Billie Ruddy, MD;  Location: [facility];  Service: Gastroenterology   ??? PR COLONOSCOPY FLX DX W/COLLJ SPEC WHEN PFRMD N/A 12/27/2017    Procedure: COLONOSCOPY, FLEXIBLE, PROXIMAL TO SPLENIC FLEXURE; DIAGNOSTIC, W/WO COLLECTION SPECIMEN BY BRUSH OR WASH;  Surgeon: Bronson Curb, MD;  Location: [facility];  Service: Gastroenterology   ??? PR REMVL COLON & TERM ILEUM W/ILEOCOLOSTOMY N/A 11/13/2013    Procedure: Ileocecectomy, meckel's diverticulectomy, mesh rectopexy;  Surgeon: Emilia Beck, MD;  Location: MAIN OR [place];  Service: Gastrointestinal   ??? PR UPPER GI ENDOSCOPY,BIOPSY N/A 11/12/2013    Procedure: UGI ENDOSCOPY; WITH BIOPSY, SINGLE OR MULTIPLE;  Surgeon: Teodoro Spray, MD;  [facility]: [facility];  Service: Gastroenterology   ??? PR UPPER GI ENDOSCOPY,BIOPSY N/A 12/27/2017    Procedure: UGI ENDOSCOPY; WITH BIOPSY, SINGLE OR MULTIPLE;  Surgeon: Bronson Curb, MD;  Location: [facility];  Service: Gastroenterology   ??? RECTAL PROLAPSE REPAIR           Current Facility-Administered Medications:   ???  acetaminophen (TYLENOL) tablet 1,000 mg, 1,000 mg, Oral, Once, Rosanne Sack, MD  ???  acetaminophen (TYLENOL) tablet 650 mg, 650 mg, Oral, Q6H, Theresa Mulligan, MD, 650 mg at 08/30/18 1610  ???  colestipoL (COLESTID) tablet 4 g, 4 g, Oral, BID, Diya Verlon Setting, MD  ???  DULoxetine (CYMBALTA) DR capsule 60 mg, 60 mg, Oral, Daily, Diya Verlon Setting, MD  ???  enoxaparin (LOVENOX) syringe 40 mg, 40 mg, Subcutaneous, Q24H, Diya T Jost, MD  ???  HYDROmorphone (PF) (DILAUDID) injection 0.5 mg, 0.5 mg, Intravenous, Q3H PRN, RUEA Verlon Setting, MD  ???  lactated Ringers infusion, 100 mL/hr, Intravenous, Continuous, Diya T Carver Fila, MD, Last Rate: 100 mL/hr at 08/30/18 0644, 100 mL/hr at 08/30/18 0644  ???  ondansetron (ZOFRAN) injection 4 mg, 4 mg, Intravenous, Q6H PRN, Diya Verlon Setting, MD, 4 mg at  08/30/18 0644  ???  oxyCODONE (ROXICODONE) immediate release tablet 5 mg, 5 mg, Oral, Q4H PRN, Theresa Mulligan, MD, 5 mg at 08/30/18 1610  ???  pantoprazole (PROTONIX) EC tablet 40 mg, 40 mg, Oral, BID, Diya Verlon Setting, MD  ???  potassium chloride (KLOR-CON) CR tablet 20 mEq, 20 mEq, Oral, Daily, Diya Verlon Setting, MD    Current Outpatient Medications:   ???  aluminum-magnesium hydroxide-simethicone (MAALOX PLUS) 200-200-20 mg/5 mL Susp, TAKE BY MOUTH EVERY 6 HOURS AS NEEDED, Disp: 710 mL, Rfl: 0  ???  colestipol (COLESTID) 1 gram tablet, Take 4 tablets (4 g total) by mouth Two (2) times a day. (Patient taking differently: Take 6 g by mouth Two (2) times a day. ), Disp: 180 tablet, Rfl: 3  ???  cyanocobalamin 1,000 mcg/mL injection, Inject 1 mL (1,000 mcg total) under the skin every thirty (30) days., Disp: 4 mL, Rfl: 2  ???  DULoxetine (CYMBALTA) 60 MG capsule, Take 1 capsule (60 mg total) by mouth daily. Take 1 capsule daily x 1 week, then 2 capsules daily thereafter., Disp: 30 capsule, Rfl: 5  ???  ondansetron (ZOFRAN) 4 MG tablet, Take 1 tablet (4 mg total) by mouth every eight (8) hours as needed for nausea., Disp: 30 tablet, Rfl: 1  ???  pantoprazole (PROTONIX) 40 MG tablet, Take 1 tablet (40 mg total) by mouth Two (2) times a day., Disp: 60 tablet, Rfl: 2  ???  potassium chloride SA (KLOR-CON) 20 MEQ tablet, Take 1 tablet (20 mEq total) by mouth daily., Disp: 30 tablet, Rfl: 5  ???  ustekinumab (STELARA) 90 mg/mL Syrg syringe, INJECT THE CONTENTS OF 1 SYRINGE (90MG ) UNDER THE SKIN EVERY 8 WEEKS, Disp: 1 mL, Rfl: 0  ???  ustekinumab (STELARA) 90 mg/mL Syrg syringe, Inject the contents of 1 syringe under the skin every 8 weeks, Disp: 1 mL, Rfl: 3  ???  acetaminophen (TYLENOL) 500 MG tablet, Take 1,000 mg by mouth every six (6) hours as needed for pain., Disp: , Rfl:   ???  empty container Misc, Use as directed, Disp: 1 each, Rfl: PRN  ???  magnesium oxide (MAG-OX) 400 mg (241.3 mg magnesium) tablet, Take 2 tablets (800 mg total) by mouth Two (2) times a day., Disp: 120 tablet, Rfl: 5  ???  metoprolol succinate (TOPROL-XL) 50 MG 24 hr tablet, Take 50 mg by mouth daily., Disp: , Rfl:   ???  multivitamin therapeutic with minerals (THERA-M) 27-0.4 mg Tab, Take 1 tablet by mouth daily. With iron and calcium, Disp: , Rfl:   ???  syringe with needle, safety (BD SAFETY-LOK DETACHABLE NEEDL) 3 mL 25 gauge x 5/8 Syrg, 1 each by Miscellaneous route every thirty (30) days., Disp: 25 Syringe, Rfl: 0    Allergies  Penicillins and Doxycycline hcl    Family History   Problem Relation Age of Onset   ??? Esophageal cancer Maternal Grandfather    ??? Cancer Maternal Grandfather         Gastric cancer       Social History  Social History     Tobacco Use   ??? Smoking status: Current Every Day Smoker     Packs/day: 1.00     Types: Cigarettes     Last attempt to quit: 12/06/2017     Years since quitting: 0.7   ??? Smokeless tobacco: Never Used   Substance Use Topics   ??? Alcohol use: Yes     Comment: socially   ??? Drug use:  No        Physical Exam     VITAL SIGNS:      Vitals:    08/29/18 1855 08/29/18 2254 08/30/18 0356   BP: 148/114 134/94 123/93   Pulse: 125 90 90   Resp: 18 18 18    Temp: 36.9 ??C (98.4 ??F)     SpO2: 100% 95% 96%       Constitutional: Alert and oriented. Well appearing and in no distress.  Eyes: Conjunctivae are normal.  Sclera nonicteric.  Pupils equal, round, and reactive to light.  Extraocular movements intact.  HEENT: Normocephalic and atraumatic. No congestion. Moist mucous membranes. Cardiovascular: Normal rate, regular rhythm. Normal and symmetric distal pulses are present in all extremities.  Respiratory: Normal respiratory effort. Breath sounds clear to auscultation bilaterally.  Symmetric chest expansion.  Gastrointestinal: Soft, diffusely tender, slightly distended, difficult to assess CVA tenderness given location of abdominal pain.  Musculoskeletal: Nontender with normal range of motion in all extremities.  No swelling or edema.  Neurologic: Normal speech and language. No gross focal neurologic deficits are appreciated.   Skin: Skin is warm, dry and intact. No rash noted.  Psychiatric: Mood and affect are normal. Speech and behavior are normal.       Radiology     CT Enterography Abdomen/Pelvis w contrast   Preliminary Result      --Sequelae of ileocecectomy with bowel wall thickening inflammatory changes at the neoileum extending approximately 15 to 20 cm, consistent with patient history of Crohn's disease. No evidence of fistula or abscess.      --5.8 cm left adnexal multiseptated ovarian cystic lesion. Recommend further evaluation with nonemergent ultrasound evaluation in 6-8 weeks to ensure resolution.           Laboratory Data     Lab Results   Component Value Date    WBC 19.1 (H) 08/29/2018    HGB 14.0 08/29/2018    HCT 45.4 08/29/2018    PLT 695 (H) 08/29/2018       Lab Results   Component Value Date    NA 137 08/29/2018    K 4.3 08/29/2018    CL 111 (H) 08/29/2018    CO2 14.0 (L) 08/29/2018    BUN 10 08/29/2018    CREATININE 0.87 08/29/2018    GLU 103 08/29/2018    CALCIUM 9.1 08/29/2018    MG 0.6 (LL) 08/14/2018    PHOS 3.4 12/23/2017       Lab Results   Component Value Date    BILITOT 0.3 08/29/2018    BILIDIR 0.2 02/18/2014    PROT 7.1 08/29/2018    ALBUMIN 4.1 08/29/2018    ALT 11 08/29/2018    AST 29 08/29/2018    ALKPHOS 85 08/29/2018       Lab Results   Component Value Date    LABPROT 9.9 (L) 02/20/2014    INR 0.93 08/29/2018       Pertinent labs & imaging results that were available during my care of the patient were reviewed by me and considered in my medical decision making (see chart for details).    Portions of this record have been created using Scientist, clinical (histocompatibility and immunogenetics). Dictation errors have been sought, but may not have been identified and corrected.    Earlie Raveling  EM1     Ola Spurr, MD  Resident  08/30/18 [phone]

## 2018-08-30 NOTE — Unmapped (Signed)
Pt upset over LOS in the er. Pt advised that there are no beds in the hospital, wants to speak w someone.

## 2018-08-30 NOTE — Unmapped (Signed)
Pt seen by entire medicine team an d dr Edrick Kins med attending

## 2018-08-30 NOTE — Unmapped (Addendum)
Mary Hawkins is a 38 y.o. female with a PMHx of stricturing Crohn's dx s/p ileoceacal resection in 2015, currently not on maintenance therapy, who presented to [facility] with acute on chronic abdominal pain and diarrhea, with CT findings c/f Crohn's flare.    Acute on chronic abd pain, diarrhea c/f Crohn's flare: P/w diffuse left sided abdominal pain with TTP, but no involuntary guarding/rigidity OE, as well as an increase in loose stools. CT showed thickening of neoterminal ileum, no acute abscess/fistulas. No evidence of nephrolithiasis, UA not c/f UTI. Inflammatory markers wnl, no fevers, however + leukocytosis. Findings most c/w Crohn's flare. Pt has not been on any maintenance therapy for Crohn's and would likely benefit from receiving Stelara infusions to reduce flares; infusion scheduled for 1/28. GI consulted, recommended bridge with entocort. C. diff and GI pathogen panel pending on discharge, although lower concern for infectious etiology, given no fevers, recent abx exposure.   - entocort taper: 9mg  QD for 9 days, 6 mg QD for 9 days, 3 mg QD for 9 days, then d/c  - Stelara infusion scheduled for 1/28  - f/u C. diff and GI pathogen panel   - PRN oxycodone   - increase home colestipol to 6 g BID    Body pains, history of fibromyalgia: Increased home cymbalta to 60 mg BID per OP notes.

## 2018-08-30 NOTE — Unmapped (Signed)
Pt transferred to team D

## 2018-08-30 NOTE — Unmapped (Signed)
Bed: 66-D  Expected date:   Expected time:   Means of arrival:   Comments:  46

## 2018-08-30 NOTE — Unmapped (Signed)
Pt transported to CT scan.

## 2018-08-30 NOTE — Unmapped (Signed)
Assumed pt care, pt awake,alert, talking on her cell phone. Pt reports abdominal pain 8/10 facial grimace noted pt receive pain meds 2 hrs ago- will reassess.

## 2018-08-31 MED ORDER — PANTOPRAZOLE 40 MG TABLET,DELAYED RELEASE
ORAL_TABLET | Freq: Every day | ORAL | 0 refills | 0.00000 days | Status: CP
Start: 2018-08-31 — End: 2018-09-30

## 2018-08-31 NOTE — Unmapped (Signed)
Physician Discharge Summary [facility]  [facility] EMERGENCY DEPARTMENT  [address]  [place] [address]  Dept: [phone]  Loc: [phone]     Identifying Information:   Mary [place]  04/16/1981  [id number]    Code Status: Full Code    Admit Date: 08/29/2018    Discharge Date: 08/30/2018     Discharge To: Home    Discharge Service: Emergency Medicine    Discharge Attending Physician: Dr. Willette Alma     Discharge Diagnoses:  Principal Problem:    Crohn's disease (CMS-HCC)  Active Problems:    Tobacco use disorder    Iron deficiency anemia  Resolved Problems:    * No resolved hospital problems. *      Outpatient Provider Follow Up Issues:   - Follow up C. Diff and GI pathogen panel    Hospital Course:   [name] is a 38 y.o. female with a PMHx of stricturing Crohn's dx s/p ileoceacal resection in 2015, currently not on maintenance therapy, who presented to [facility] with acute on chronic abdominal pain and diarrhea, with CT findings c/f Crohn's flare.    Acute on chronic abd pain, diarrhea c/f Crohn's flare: P/w diffuse left sided abdominal pain with TTP, but no involuntary guarding/rigidity OE, as well as an increase in loose stools. CT showed thickening of neoterminal ileum, no acute abscess/fistulas. No evidence of nephrolithiasis, UA not c/f UTI. Inflammatory markers wnl, no fevers, however + leukocytosis. Findings most c/w Crohn's flare. Pt has not been on any maintenance therapy for Crohn's and would likely benefit from receiving Stelara infusions to reduce flares; infusion scheduled for 1/28. GI consulted, recommended bridge with entocort. C. diff and GI pathogen panel pending on discharge, although lower concern for infectious etiology, given no fevers, recent abx exposure.   - entocort taper: 9mg  QD for 9 days, 6 mg QD for 9 days, 3 mg QD for 9 days, then d/c  - Stelara infusion scheduled for 1/28  - f/u C. diff and GI pathogen panel   - PRN oxycodone   - increase home colestipol to 6 g BID Body pains, history of fibromyalgia: Increased home cymbalta to 60 mg BID per OP notes.           Procedures:  None  ______________________________________________________________________  Discharge Medications:     Your Medication List      STOP taking these medications    empty container Misc     ondansetron 4 MG tablet  Commonly known as:  ZOFRAN        START taking these medications    budesonide 3 mg 24 hr capsule  Commonly known as:  ENTOCORT EC  Take 3 tablets once a day for 9 days, followed by 2 tablets once a day for 9 days, followed by 1 tablet once a day for 9 days.     ondansetron 4 MG disintegrating tablet  Commonly known as:  ZOFRAN-ODT  Take 2 tablets (8 mg total) by mouth every twelve (12) hours as needed for nausea for up to 7 days.     oxyCODONE 10 mg immediate release tablet  Commonly known as:  ROXICODONE  Take 1 tablet (10 mg total) by mouth every four (4) hours as needed for up to 5 days.        CHANGE how you take these medications    colestipoL 1 gram tablet  Commonly known as:  COLESTID  Take 6 tablets (6 g total) by mouth Two (2) times a day.  What changed:    ?? how much to take  ?? Another medication with the same name was removed. Continue taking this medication, and follow the directions you see here.     DULoxetine 60 MG capsule  Commonly known as:  CYMBALTA  Take 1 capsule (60 mg total) by mouth Two (2) times a day.  What changed:    ?? when to take this  ?? additional instructions  ?? Another medication with the same name was removed. Continue taking this medication, and follow the directions you see here.     pantoprazole 40 MG tablet  Commonly known as:  PROTONIX  Take 1 tablet (40 mg total) by mouth daily.  Start taking on:  August 31, 2018  What changed:  when to take this        CONTINUE taking these medications    acetaminophen 500 MG tablet  Commonly known as:  TYLENOL  Take 1,000 mg by mouth every six (6) hours as needed for pain.     cyanocobalamin 1,000 mcg/mL injection  Inject 1 mL (1,000 mcg total) under the skin every thirty (30) days.     magnesium oxide 400 mg (241.3 mg magnesium) tablet  Commonly known as:  MAG-OX  Take 2 tablets (800 mg total) by mouth Two (2) times a day.     potassium chloride SA 20 MEQ tablet  Commonly known as:  KLOR-CON  Take 1 tablet (20 mEq total) by mouth daily.     syringe with needle, safety 3 mL 25 gauge x 5/8 Syrg  Commonly known as:  BD SAFETY-LOK DETACHABLE NEEDL  1 each by Miscellaneous route every thirty (30) days.     ustekinumab 90 mg/mL Syrg syringe  Commonly known as:  STELARA  Inject the contents of 1 syringe under the skin every 8 weeks            Allergies:  Penicillins and Doxycycline hcl  ______________________________________________________________________  Pending Test Results (if blank, then none):   Order Current Status    GI Pathogen Panel Collected (08/30/18 1441)    C. Difficile Assay In process    ED Extra Tubes In process    PINK EXTRA TUBE In process    Urine Culture In process          Most Recent Labs:  All lab results last 24 hours -   Recent Results (from the past 24 hour(s))   Comprehensive Metabolic Panel    Collection Time: 08/29/18  9:33 PM   Result Value Ref Range    Sodium 137 135 - 145 mmol/L    Potassium 4.3 3.5 - 5.0 mmol/L    Chloride 111 (H) 98 - 107 mmol/L    Anion Gap 12 7 - 15 mmol/L    CO2 14.0 (L) 22.0 - 30.0 mmol/L    BUN 10 7 - 21 mg/dL    Creatinine 1.61 0.96 - 1.00 mg/dL    BUN/Creatinine Ratio 11     EGFR CKD-EPI Non-African American, Female 85 >=60 mL/min/1.41m2    EGFR CKD-EPI African American, Female >90 >=60 mL/min/1.56m2    Glucose 103 70 - 179 mg/dL    Calcium 9.1 8.5 - 04.5 mg/dL    Albumin 4.1 3.5 - 5.0 g/dL    Total Protein 7.1 6.5 - 8.3 g/dL    Total Bilirubin 0.3 0.0 - 1.2 mg/dL    AST 29 14 - 38 U/L    ALT 11 <35 U/L    Alkaline Phosphatase 85  38 - 126 U/L   Lipase Level    Collection Time: 08/29/18  9:33 PM   Result Value Ref Range    Lipase 83 44 - 232 U/L   CBC w/ Differential    Collection Time: 08/29/18  9:33 PM   Result Value Ref Range    WBC 19.1 (H) 4.5 - 11.0 10*9/L    RBC 4.87 4.00 - 5.20 10*12/L    HGB 14.0 12.0 - 16.0 g/dL    HCT 16.1 09.6 - 04.5 %    MCV 93.3 80.0 - 100.0 fL    MCH 28.8 26.0 - 34.0 pg    MCHC 30.9 (L) 31.0 - 37.0 g/dL    RDW 40.9 (H) 81.1 - 15.0 %    MPV 7.3 7.0 - 10.0 fL    Platelet 695 (H) 150 - 440 10*9/L    Neutrophils % 72.8 %    Lymphocytes % 19.8 %    Monocytes % 3.3 %    Eosinophils % 1.4 %    Basophils % 0.5 %    Absolute Neutrophils 13.9 (H) 2.0 - 7.5 10*9/L    Absolute Lymphocytes 3.8 1.5 - 5.0 10*9/L    Absolute Monocytes 0.6 0.2 - 0.8 10*9/L    Absolute Eosinophils 0.3 0.0 - 0.4 10*9/L    Absolute Basophils 0.1 0.0 - 0.1 10*9/L    Large Unstained Cells 2 0 - 4 %    Hypochromasia Marked (A) Not Present   Morphology Review    Collection Time: 08/29/18  9:33 PM   Result Value Ref Range    Smear Review Comments See Comment (A) Undefined    Toxic Vacuolation Present (A) Not Present   Sedimentation rate, manual    Collection Time: 08/29/18  9:33 PM   Result Value Ref Range    Sed Rate 6 0 - 20 mm/h   C-reactive protein    Collection Time: 08/29/18  9:33 PM   Result Value Ref Range    CRP 9.7 <10.0 mg/L   Troponin I    Collection Time: 08/29/18  9:33 PM   Result Value Ref Range    Troponin I <0.034 <0.034 ng/mL   PT-INR    Collection Time: 08/29/18  9:49 PM   Result Value Ref Range    PT 10.7 10.2 - 13.1 sec    INR 0.93    Pregnancy, urine    Collection Time: 08/29/18 10:08 PM   Result Value Ref Range    Pregnancy Test, Urine Negative Negative   Urinalysis with Culture Reflex    Collection Time: 08/29/18 10:08 PM   Result Value Ref Range    Color, UA Yellow     Clarity, UA Hazy     Specific Gravity, UA 1.028 1.003 - 1.030    pH, UA 6.0 5.0 - 9.0    Leukocyte Esterase, UA Trace (A) Negative    Nitrite, UA Negative Negative    Protein, UA 30 mg/dL (A) Negative    Glucose, UA Negative Negative    Ketones, UA Negative Negative    Urobilinogen, UA 0.2 mg/dL 0.2 mg/dL, 1.0 mg/dL    Bilirubin, UA Negative Negative    Blood, UA Negative Negative    RBC, UA 3 <=4 /HPF    WBC, UA 2 0 - 5 /HPF    Squam Epithel, UA 12 (H) 0 - 5 /HPF    Bacteria, UA Rare (A) None Seen /HPF    Mucus, UA Rare (A) None Seen /HPF  ECG 12 lead (Adult)    Collection Time: 08/29/18 10:46 PM   Result Value Ref Range    EKG Systolic BP  mmHg    EKG Diastolic BP  mmHg    EKG Ventricular Rate 91 BPM    EKG Atrial Rate 91 BPM    EKG P-R Interval 150 ms    EKG QRS Duration 80 ms    EKG Q-T Interval 392 ms    EKG QTC Calculation 482 ms    EKG Calculated P Axis 46 degrees    EKG Calculated R Axis 46 degrees    EKG Calculated T Axis 31 degrees    QTC Fredericia 450 ms   Magnesium Level    Collection Time: 08/30/18  8:00 AM   Result Value Ref Range    Magnesium 1.4 (L) 1.6 - 2.2 mg/dL   Phosphorus Level    Collection Time: 08/30/18  8:00 AM   Result Value Ref Range    Phosphorus 2.6 (L) 2.9 - 4.7 mg/dL   Vitamin Z30 Level    Collection Time: 08/30/18  8:00 AM   Result Value Ref Range    Vitamin B-12 768 193 - 900 pg/ml       Relevant Studies/Radiology (if blank, then none):  Ct Enterography Abdomen/pelvis W Contrast    Result Date: 08/30/2018  EXAM: CT ENTEROGRAPHY ABDOMEN/PELVIS W CONTRAST (ENTEROGRAPHY PROTOCOL) DATE: 08/30/2018 1:36 AM ACCESSION: [id number] UN DICTATED: 08/30/2018 3:12 AM INTERPRETATION LOCATION: [facility] CLINICAL INDICATION: Crohn's flare vs infection ; Crohn's exacerbation (Age => 17y)  COMPARISON: CT abdomen pelvis 12/24/2017 and prior TECHNIQUE: A spiral CT scan was obtained with IV contrast from the lung bases to the pubic symphysis after administration of Volumen oral contrast. Images were reconstructed in the axial plane. Coronal reformatted images were also provided for further evaluation. FINDINGS: LINES AND TUBES: None. LOWER THORAX: Normal. HEPATOBILIARY: Hepatic steatosis. The gallbladder is present and otherwise unremarkable. No biliary ductal dilatation.  SPLEEN: No splenomegaly. PANCREAS: No focal masses or ductal dilatation. ADRENALS: No adrenal nodules. KIDNEYS/URETERS AND BLADDER: No hydronephrosis, stones, or solid mass lesions. Unremarkable urinary bladder. PELVIC/REPRODUCTIVE ORGANS: Trace free fluid within the endometrial cavity. Large septated left ovarian cystic lesion measuring up to 5.8 cm (2:122). GI TRACT: Patient is status post Volumen oral contrast administration. Postoperative sequelae of ileocecectomy with anastomosis in the right lower quadrant as well as Meckel's diverticulectomy. There is thickening of the neoterminal ileum extending approximately 15 cm with associated mesenteric adenopathy in the right lower quadrant and increased appearance of multiple vessels adjacent to this loop of bowel. Mild stranding about the thickened loop of bowel. No other focally thickened loops of bowel are identified. No evidence of fistula.  PERITONEUM/RETROPERITONEUM AND MESENTERY: No free air or fluid. LYMPH NODES: Prominent right lower quadrant mesenteric nodes measuring up to 1.0 cm (2:90). VESSELS: The aorta is normal in caliber.  The celiac, SMA, and IMA are patent. No significant calcified atherosclerotic disease. The portal venous system is patent. The hepatic veins and IVC are unremarkable. BONES AND SOFT TISSUES: Bilateral breast implants. Soft tissues are otherwise unremarkable.     -- Active inflammatory bowel disease involving a 15 to 20 cm segment of neoterminal ileum. No evidence of fistula or abscess. -- 5.8 cm cystic left ovarian lesion with internal septation. Recommend further evaluation with nonemergent ultrasound evaluation in 6-8 weeks to ensure resolution.    ______________________________________________________________________  Discharge Instructions:   Activity Instructions     Activity as tolerated  Follow Up instructions and Outpatient Referrals     Call MD for:  extreme fatigue      Call MD for:  persistent dizziness or light-headedness      Call MD for:  persistent nausea or vomiting      Call MD for:  severe uncontrolled pain      Call MD for: Temperature > 38.5 Celsius ( > 101.3 Fahrenheit)      Discharge instructions      Dear Ms. Mcdaniel, you were admitted to [facility] with left sided abdominal pain. We think this was most likely due to a Crohn's flare. We have started you on Budesnoide (steroid) for that flare, take as prescribed. We have increased your colestid dose to 6 tablets and your cymbalta to two times a day.               Appointments which have been scheduled for you    Sep 04, 2018  2:30 PM EST  (Arrive by 2:15 PM)  INFUSION - STELARA 60 [facility] with [facility] [place] RN 1, [facility] [place] CHAIR 3  [facility] [place] [facility]) [address] [place] [address]  [phone]      Nov 06, 2018 10:00 AM EDT  (Arrive by 9:30 AM)  RETURN IBD with Rosanne Sack, MD  [facility]) [address]  [place] [address]  [phone]           ______________________________________________________________________  Discharge Day Services:  BP 120/89  - Pulse 85  - Temp 36.5 ??C (Oral)  - Resp 18  - SpO2 94%     Pt seen on the day of discharge and determined appropriate for discharge.    Condition at Discharge: stable    Length of Discharge: I spent less than 30 mins in the discharge of this patient.    Lamona Curl, Ms4  Acting Intern    I attest that I have reviewed the student note and that the components of the history of the present illness, the physical exam, and the assessment and plan documented were performed by me or were performed in my presence by the student where I verified the documentation and performed (or re-performed) the exam and medical decision making.     Salome Arnt, M.D.

## 2018-08-31 NOTE — Unmapped (Signed)
GASTROENTEROLOGY INPATIENT CONSULTATION NOTE    Requesting Attending Physician :  Wendelyn Breslow, MD    Reason for Consult:    Mary Hawkins is a 38 y.o. female seen in consultation at the request of Dr. Wendelyn Breslow, MD for evaluation of  Crohns Flare.    ASSESSMENT  38 y.o. year old female with history of stricturing ileocolonic Crohn's disease s/p ileocecal resection, incidental Meckel's diverticulum noted and resected, mesh rectopexy for rectal prolapse in 09/2013, failed Humira d/t recurrent infections, hx of ARDS 2/2 rhinovirus 12/2017, who presents with acute worsening of chronic abdominal pain. She had i2 disease on colonoscopy 12/2017 and evidence of ongoing inflammation on CT today.  She is not on any specific Crohns treatment. She was previously on Stelara, but self discontinued due to weight gain. Recent empiric SIBO teatment and bile salt binders has not improved her symptoms. While she has active inflammation, she also has overlapping functional pain. She due for reloading of Stelara tomorrow AM. We had extensive discussion with the patient that initiation of Stelara would be the best way to get the Crohns inflammation under control and hopefully improve some of her pain. Given stelara may not work immediately, we discussed prednisone as a bridge, but she is reluctant to take steroids because she does not like the way she feels. She is agreeable to entocort however. We strongly encouraged the patient to consider discharge so that she could make it to the infusion appointment. However, she opted to reschedule the stelara loading to 1/28 unfortunately further delaying more definitive treatment of her crohns. In the meantime, bridge with entocort is not unreasonable, as well as symptom management with anti diarrheals, and limiting opiates.     PLAN    1. Start Entocort 9 mg.   2. Continue colestipol   3. Continue cymbalta  4. Minimize opiates as much as possible.   5. IV Fe infusion if will remain inpatient   6. VTE ppx   7. Daily Labs  8.  Electrolyte replacement--> consider IV Mg replacement as oral can cause diarrhea (oral Mg+protein can be more gentle on digestive tract as well)  9. Follow up c.diff and GIPP    The patient was seen and discussed with Dr. Marland Mcalpine    Thank you for this consult. Please page the [facility] consult pager at [phone] with any further questions or change in clinical condition.    [name] MD  [facility] Gastroenterology and Hepatology Fellow          Chief Complaint:     Abdominal pain    History of Present Illness:   This is a 38 y.o. year old female with history of stricturing ileocolonic Crohn's disease s/p ileocecal resection, incidental Meckel's diverticulum noted and resected, mesh rectopexy for rectal prolapse in 09/2013, failed Humira d/t recurrent infections, hx of ARDS 2/2 rhinovirus 12/2017, who presents with acute worsening of chronic abdominal pain.     She notes worsened LLQ pain, which appears to be chronic. The pain radiates to her back. It is worse with positioning. She has decreased appetite. She also notes worsening diarrhea-- up to 10 loose non bloody stools.     The patient was recently seen in IBD clinic with Dr. [name] who recommend the patient restart ustekinumab, which the patient had self continued in the fall. She was also treated empirically for SIBO with flagyl (completed), and bile salt diarrhea with colestipol, which she increased to help with loose stools.  In ED virals signs were stable. Labs notable for CRP 9, WBC 19.1K. CT showed distal ileal inflammation, no fistula, abscess, or stricture.       CT AP: IMPRESSION:  ??  -- Active inflammatory bowel disease involving a 15 to 20 cm segment of neoterminal ileum. No evidence of fistula or abscess.  ??  -- 5.8 cm cystic left ovarian lesion with internal septation. Recommend further evaluation with nonemergent ultrasound evaluation in 6-8 weeks to ensure resolution.    Review of Systems:  The balance of 12 systems reviewed is negative except as noted in the HPI.     Past Medical History:  Past Medical History:   Diagnosis Date   ??? Anxiety    ??? Arthritis    ??? Back pain    ??? Crohn disease (CMS-HCC)    ??? Eczema    ??? Endometriosis    ??? GERD (gastroesophageal reflux disease)    ??? Headache(784.0)    ??? Hypertension    ??? Ulcerative colitis (CMS-HCC)        Surgical History:  Past Surgical History:   Procedure Laterality Date   ??? BREAST RECONSTRUCTION  2004   ??? ENDOMETRIAL ABLATION     ??? PR COLONOSCOPY FLX DX W/COLLJ SPEC WHEN PFRMD N/A 11/12/2013    Procedure: COLONOSCOPY, FLEXIBLE, PROXIMAL TO SPLENIC FLEXURE; DIAGNOSTIC, W/WO COLLECTION SPECIMEN BY BRUSH OR WASH;  Surgeon: Teodoro Spray, MD;  Location: [facility];  Service: Gastroenterology   ??? PR COLONOSCOPY FLX DX W/COLLJ SPEC WHEN PFRMD N/A 02/20/2014    Procedure: COLONOSCOPY, FLEXIBLE, PROXIMAL TO SPLENIC FLEXURE; DIAGNOSTIC, W/WO COLLECTION SPECIMEN BY BRUSH OR WASH;  Surgeon: Billie Ruddy, MD;  Location: [facility];  Service: Gastroenterology   ??? PR COLONOSCOPY FLX DX W/COLLJ SPEC WHEN PFRMD N/A 12/27/2017    Procedure: COLONOSCOPY, FLEXIBLE, PROXIMAL TO SPLENIC FLEXURE; DIAGNOSTIC, W/WO COLLECTION SPECIMEN BY BRUSH OR WASH;  Surgeon: Bronson Curb, MD;  Location: [facility];  Service: Gastroenterology   ??? PR REMVL COLON & TERM ILEUM W/ILEOCOLOSTOMY N/A 11/13/2013    Procedure: Ileocecectomy, meckel's diverticulectomy, mesh rectopexy;  Surgeon: Emilia Beck, MD;  Location: MAIN OR [place];  Service: Gastrointestinal   ??? PR UPPER GI ENDOSCOPY,BIOPSY N/A 11/12/2013    Procedure: UGI ENDOSCOPY; WITH BIOPSY, SINGLE OR MULTIPLE;  Surgeon: Teodoro Spray, MD;  Location: GI [facility];  Service: Gastroenterology   ??? PR UPPER GI ENDOSCOPY,BIOPSY N/A 12/27/2017    Procedure: UGI ENDOSCOPY; WITH BIOPSY, SINGLE OR MULTIPLE;  Surgeon: Bronson Curb, MD;  Location: GI [facility]; Service: Gastroenterology   ??? RECTAL PROLAPSE REPAIR         Family History:  The patient's family history includes Cancer in her maternal grandfather; Esophageal cancer in her maternal grandfather..    Medications:   Current Facility-Administered Medications   Medication Dose Route Frequency Provider Last Rate Last Dose   ??? acetaminophen (TYLENOL) tablet 650 mg  650 mg Oral Q6H Diya Verlon Setting, MD   650 mg at 08/30/18 1239   ??? budesonide (ENTOCORT EC) 24 hr capsule 6 mg  6 mg Oral Daily Windell Moment, MD       ??? colestipoL (COLESTID) tablet 6 g  6 g Oral BID Windell Moment, MD       ??? DULoxetine (CYMBALTA) DR capsule 60 mg  60 mg Oral BID Windell Moment, MD       ??? enoxaparin (LOVENOX) syringe 40  mg  40 mg Subcutaneous Q24H Theresa Mulligan, MD   40 mg at 08/30/18 0929   ??? magnesium oxide (MAG-OX) tablet 400 mg  400 mg Oral Daily Windell Moment, MD   400 mg at 08/30/18 1239   ??? ondansetron (ZOFRAN) injection 4 mg  4 mg Intravenous Q6H PRN Theresa Mulligan, MD   4 mg at 08/30/18 0644   ??? oxyCODONE (ROXICODONE) immediate release tablet 10 mg  10 mg Oral Q4H PRN Windell Moment, MD   10 mg at 08/30/18 1239   ??? pantoprazole (PROTONIX) EC tablet 40 mg  40 mg Oral Daily Windell Moment, MD   40 mg at 08/30/18 1610   ??? potassium & sodium phosphates 250mg  (PHOS-NAK/NEUTRA PHOS) packet 1 packet  1 packet Oral 4x Daily Windell Moment, MD   1 packet at 08/30/18 1241     Current Outpatient Medications   Medication Sig Dispense Refill   ??? colestipoL (COLESTID) 1 gram tablet Take 6 g by mouth Two (2) times a day.     ??? cyanocobalamin 1,000 mcg/mL injection Inject 1 mL (1,000 mcg total) under the skin every thirty (30) days. 4 mL 2   ??? DULoxetine (CYMBALTA) 60 MG capsule Take 60 mg by mouth Two (2) times a day.     ??? ondansetron (ZOFRAN) 4 MG tablet Take 1 tablet (4 mg total) by mouth every eight (8) hours as needed for nausea. 30 tablet 1   ??? pantoprazole (PROTONIX) 40 MG tablet Take 1 tablet (40 mg total) by mouth Two (2) times a day. 60 tablet 2   ??? potassium chloride SA (KLOR-CON) 20 MEQ tablet Take 1 tablet (20 mEq total) by mouth daily. 30 tablet 5   ??? ustekinumab (STELARA) 90 mg/mL Syrg syringe Inject the contents of 1 syringe under the skin every 8 weeks 1 mL 3   ??? acetaminophen (TYLENOL) 500 MG tablet Take 1,000 mg by mouth every six (6) hours as needed for pain.     ??? empty container Misc Use as directed 1 each PRN   ??? magnesium oxide (MAG-OX) 400 mg (241.3 mg magnesium) tablet Take 2 tablets (800 mg total) by mouth Two (2) times a day. 120 tablet 5   ??? syringe with needle, safety (BD SAFETY-LOK DETACHABLE NEEDL) 3 mL 25 gauge x 5/8 Syrg 1 each by Miscellaneous route every thirty (30) days. 25 Syringe 0       Allergies:  Penicillins and Doxycycline hcl    Social History:  Social History     Patient does not qualify to have social determinant information on file (likely too young).   Social History Narrative    Currently unemployed.  She is a 19-year-old son which takes care of.        Objective:     Vital Signs:  Temp:  [36.4 ??C-36.9 ??C] 36.8 ??C  Heart Rate:  [75-125] 89  SpO2 Pulse:  [90] 90  Resp:  [16-22] 22  BP: (118-148)/(81-114) 134/88  MAP (mmHg):  [91-104] 91  SpO2:  [95 %-100 %] 95 %    Physical Exam:  General: appears uncomfortable  HEENT: anicteric sclera, OP clear  CV: RRR, no m/r/g, no edema  Lungs: CTAB, normal wob  Abd: soft, tender in LLQ and back, non-distended, +bs  Skin: No rashes  Psych: alert, appropriate mood and affect.   Neuro: normal speech, no gross focal deficits       Diagnostic Studies:  I reviewed all pertinent  diagnostic studies, including:      Labs:    Lab Results   Component Value Date    WBC 19.1 (H) 08/29/2018    HGB 14.0 08/29/2018    HCT 45.4 08/29/2018    PLT 695 (H) 08/29/2018       Lab Results   Component Value Date    NA 137 08/29/2018    K 4.3 08/29/2018    CL 111 (H) 08/29/2018    CO2 14.0 (L) 08/29/2018    BUN 10 08/29/2018    CREATININE 0.87 08/29/2018    GLU 103 08/29/2018 CALCIUM 9.1 08/29/2018    MG 1.4 (L) 08/30/2018    PHOS 2.6 (L) 08/30/2018       Lab Results   Component Value Date    BILITOT 0.3 08/29/2018    BILIDIR 0.2 02/18/2014    PROT 7.1 08/29/2018    ALBUMIN 4.1 08/29/2018    ALT 11 08/29/2018    AST 29 08/29/2018    ALKPHOS 85 08/29/2018       Lab Results   Component Value Date    LABPROT 9.9 (L) 02/20/2014    INR 0.93 08/29/2018         Imaging:   Radiology studies were personally reviewed

## 2018-08-31 NOTE — Unmapped (Signed)
Patient rounds completed. The following patient needs were addressed:  Pain, Toileting, Personal Belongings, Plan of Care, Call Bell in Reach and Bed Position Low .

## 2018-09-04 ENCOUNTER — Ambulatory Visit: Admit: 2018-09-04 | Discharge: 2018-09-05 | Payer: MEDICAID

## 2018-09-04 DIAGNOSIS — K50919 Crohn's disease, unspecified, with unspecified complications: Principal | ICD-10-CM

## 2018-09-04 DIAGNOSIS — D509 Iron deficiency anemia, unspecified: Secondary | ICD-10-CM

## 2018-09-04 NOTE — Unmapped (Signed)
1430Patient presents for Feraheme infusion.  Reports no new medical hx since last infusion.  Vitals stable.  IV started1435   1444 Feraheme 510 mg started to infuse at 513ml/hr.  1458 Feraheme infusion complete.  Patient without any s/s of adverse reaction.  Vital stable.  NS flush started.  1459 NS flush complete.  Vitals stable.       1502 Stelara 390mg  infusing at 26ml/hr.  1559 Stelara infusion complete.  Patient tolerated without complication.  NS flush started.  1600 NS flush complete.  IV d/c'd.  Patient discharged without complication.    Patient presents for Feraheme infusion.  Reports no new medical hx since last infusion.  Vitals stable.  IV started 1435.

## 2018-09-06 NOTE — Unmapped (Signed)
Behavioral Health Collaborative Care Management Psychiatric Consultation    Consultation with [facility] Manager in regards to patient's behavioral health needs. Reviewed [facility] care plan and discussed patient treatment progress.    This is a new patient to our program.  She is a 38 year old with a history of Crohn's disease and chronic pain, as well as depression and anxiety.  It appears she has longstanding depression dating from when she was a teenager, but this worsened following the diagnosis of Crohn's disease in 02/01/2009.  Anxiety also substantially increased after this diagnosis.  Recently, she has been in which she describes as a constant state of worry, largely related to a worsening of her Crohn's disease symptoms.  She reports that she does not like to leave the house because of fear of her frequent bowel movements.  She also reports that frequent bowel movements and pain are interrupting her sleep, and that she only sleeps about 2 hours at a time.  She has recently been seen in the GI clinic, as well as been briefly hospitalized for abdominal pain.  She is currently receiving Stelara infusions.  She is reporting daily anxiety attacks, that sound consistent with panic attacks and are not related to identifiable triggers.  Current depression anxiety score showed moderate level of symptoms.  She was started on Cymbalta in May 2019 during hospitalization at [facility], and this medication has been titrated up to 60 mg daily.  She denies any benefit from this medicine at all.  She has previously been prescribed Adderall when she was in her early 64s, which she describes as being helpful for inattention.  This medication was stopped when she changed physicians.  She has been on Xanax previously, which was transitioned to as needed Ativan during her hospitalization at [facility] in 2018/02/01, but this medicine was not continued on discharge.  She was also previously trialed on fluoxetine, which she discontinued due to side effects (felt like a zombie).  She has previously worked with a Paramedic, but is not currently seeing anyone on a regular basis.  She denies substance use recently except for cigarette smoking, although a chart review shows that she described an increase in alcohol use following the death of her father in 2017-02-01.    PHQ-9 PHQ-9 TOTAL SCORE   08/14/2018 13     GAD7 Total Score GAD-7 Total Score   08/14/2018 11         Revision to care plan recommended as follows:     Given her current symptoms of anxiety, there are several different possible changes to her medication regimen, depending on her preference.    1) If the patient would like to continue taking Cymbalta, augmentation with another antidepressant could be trialed, she has never taken 2 antidepressants at once per her reported history.  Options for this would include:    Mirtazapine 15 mg p.o. nightly.  Mirtazapine would have the advantage of also potentially helping with her sleep, given that it has the side effect of sedation.  It is also least likely to worsen any GI symptoms and is more likely than Bupropion to help with anxiety.  The most common side effect from mirtazapine other than sedation would be a possibility for a small amount of weight gain (approximately 2.2 kg on average).  Vs.  Bupropion XL 150 mg p.o. daily.  Bupropion would have the advantage of potentially also helping with smoking cessation, in addition to improving attention (it has been shown to be effective in reducing  ADHD symptoms and some adults).  Fortunately, bupropion can worsen anxiety in some individuals, and if taken later in the day can also worsen sleep.    2) If the patient would like to discontinue taking Cymbalta, then I would suggest transitioning to mirtazapine, given that this could also potentially help with her sleep and is the least likely antidepressant to worsen her GI symptoms.  She would need to taper down slowly off Cymbalta, in order to prevent discontinuation syndrome.  A reasonable taper would be reduction to 40 mg daily for 1 week, then 20 mg daily for 2 weeks, then discontinue.  Mirtazapine 15 mg can be started when Cymbalta was decreased to 40 mg, and then increase to 30 mg when Cymbalta is decreased to 20 mg.  See below for specific medication regimen by week.  Week 1 - Cymbalta 40 mg qday + Mirtazapine 15 mg qHS  Week 2 - Cymbalta 20 mg qday + Mirtazapine 30 mg qHS  Week 3 - Cymbalta 20 mg qday + Mirtazapine 30 mg qHS  Week 4 and beyond - Mirtazapine 30 mg qHS    3) Regardless of whether the patient chooses to do either of the options above, if she would like a as needed medication to target severe anxiety in the moment, she could trial hydroxyzine 25 mg p.o. 3 times daily as needed for anxiety.    4) I would avoid benzodiazepines in this patient has a history of recent alcohol use following an acute stressor.  Benzodiazepines also can perpetuate acute panic in some cases.    5) The care manager will follow up with the patient regarding behavioral strategies to try and manage anxiety.  CBT is the most efficacious in managing panic disorder, although alternative approaches such as mindfulness may be beneficial if the patient is open to this.    This case review was conducted with information gathered from chart and from care manager information.  I have not examined the patient directly.  Physician should incorporate recommendations as appropriate given full current clinical picture.

## 2018-09-06 NOTE — Unmapped (Addendum)
Behavioral Health Care Management            Date of Service:  09/06/2018        Date of Last Encounter:  09/03/2018    Purpose of contact:     [facility] Social Worker contacted the patient as part of the BHCMP- unable to LVM at mobile #, vm inbox not set up/ LVM for pt at home #.    Follow Up Plan/Next Steps:    []  Schedule follow up appointment with GI Physician  []  Provide referral/linkage information:  []  Consult with GI Physician, re: care plan and patient's treatment progress  [x]  Other: f/u mood, behavioral health assessment      Additional Information/Plan:  Pt was provided with my direct office phone number should additional needs arise.    Future Appointments   Date Time Provider Department Center   09/11/2018  2:45 PM Lyn Henri RN 2 [facility]   11/06/2018 10:00 AM Rosanne Sack, MD [facility]       Ottie Glazier, MSW, M.Ed, LCSWA  Social Worker- MSW  [facility]  [phone]
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
## 2018-09-07 MED ORDER — BUDESONIDE DR - ER 3 MG CAPSULE,DELAYED,EXTENDED RELEASE
ORAL_CAPSULE | 1 refills | 0 days | Status: CP
Start: 2018-09-07 — End: 2018-10-05

## 2018-09-07 MED ORDER — HYDROXYZINE HCL 25 MG TABLET
ORAL_TABLET | Freq: Three times a day (TID) | ORAL | 1 refills | 0 days | Status: CP | PRN
Start: 2018-09-07 — End: 2018-10-11

## 2018-09-07 MED ORDER — MIRTAZAPINE 15 MG TABLET
ORAL_TABLET | Freq: Every evening | ORAL | 5 refills | 0 days | Status: CP
Start: 2018-09-07 — End: 2018-10-11

## 2018-09-07 MED ORDER — DIPHENOXYLATE-ATROPINE 2.5 MG-0.025 MG TABLET
ORAL_TABLET | Freq: Three times a day (TID) | ORAL | 1 refills | 0 days | Status: CP | PRN
Start: 2018-09-07 — End: 2018-10-10

## 2018-09-07 NOTE — Unmapped (Signed)
Pt due for first stelara injection on 3/24 from [facility] - msg sent to pharmacist to set up delivery closer to date of her first injection. Last time pt failed to f/u with GI clinic after stelara infusion or respond to pharmacy delivery requests.

## 2018-09-07 NOTE — Unmapped (Signed)
Called Mary Hawkins to follow up on recommendations from Dr. Lonell Face. She prefers to start Remeron 15 mg nightly in combination with Cymbalta 60 mg BID which she is already taking. Discussed risks of medication including sedation, weight gain. She also would like to try hydroxyzine 25 mg TID PRN for acute anxiety.    She brought up her pain again and discussed that I will not prescribe narcotic pain medications for her abdominal pain. I offered prescription for prednisone in place of the Entocort while we are awaiting her Stelara to take effect but she declined saying prednisone makes her crazy. I do think that we should keep her on Entocort 9 mg daily for at least the next month, then taper to 6 mg daily March until she sees me in clinic at the end of that month. I will also send in Rx for Lomotil to help her diarrhea as she has found Imodium to be ineffective.    12 minutes spent on phone with patient.    Mardee Postin, M.D.  Gastroenterology Fellow

## 2018-09-07 NOTE — Unmapped (Signed)
A minimum of 70 minutes of integrated behavioral health services have been provided during this calendar month. See previous patient outreach encounter notes for details of services and durations.

## 2018-09-09 ENCOUNTER — Ambulatory Visit
Admit: 2018-09-09 | Discharge: 2018-09-09 | Disposition: A | Discharge status: Home / Self Care | Payer: MEDICAID | Attending: Family Medicine

## 2018-09-09 ENCOUNTER — Emergency Department
Admit: 2018-09-09 | Discharge: 2018-09-09 | Disposition: A | Discharge status: Home / Self Care | Payer: MEDICAID | Attending: Family Medicine

## 2018-09-09 DIAGNOSIS — R0781 Pleurodynia: Secondary | ICD-10-CM

## 2018-09-09 DIAGNOSIS — R1011 Right upper quadrant pain: Principal | ICD-10-CM

## 2018-09-09 LAB — CBC W/ AUTO DIFF
BASOPHILS ABSOLUTE COUNT: 0.1 10*9/L (ref 0.0–0.1)
BASOPHILS RELATIVE PERCENT: 0.6 %
EOSINOPHILS ABSOLUTE COUNT: 0.2 10*9/L (ref 0.0–0.4)
EOSINOPHILS RELATIVE PERCENT: 1 %
HEMATOCRIT: 41.6 % (ref 36.0–46.0)
LARGE UNSTAINED CELLS: 2 % (ref 0–4)
LYMPHOCYTES ABSOLUTE COUNT: 3.7 10*9/L (ref 1.5–5.0)
MEAN CORPUSCULAR HEMOGLOBIN CONC: 30.8 g/dL — ABNORMAL LOW (ref 31.0–37.0)
MEAN CORPUSCULAR HEMOGLOBIN: 28.7 pg (ref 26.0–34.0)
MEAN CORPUSCULAR VOLUME: 93.1 fL (ref 80.0–100.0)
MEAN PLATELET VOLUME: 7.3 fL (ref 7.0–10.0)
MONOCYTES ABSOLUTE COUNT: 0.7 10*9/L (ref 0.2–0.8)
MONOCYTES RELATIVE PERCENT: 3.1 %
NEUTROPHILS ABSOLUTE COUNT: 16.1 10*9/L — ABNORMAL HIGH (ref 2.0–7.5)
NEUTROPHILS RELATIVE PERCENT: 76.2 %
RED BLOOD CELL COUNT: 4.46 10*12/L (ref 4.00–5.20)
RED CELL DISTRIBUTION WIDTH: 15.9 % — ABNORMAL HIGH (ref 12.0–15.0)
WBC ADJUSTED: 21.1 10*9/L — ABNORMAL HIGH (ref 4.5–11.0)

## 2018-09-09 LAB — ERYTHROCYTE SEDIMENTATION RATE: Lab: 7

## 2018-09-09 LAB — COMPREHENSIVE METABOLIC PANEL
ALBUMIN: 4.1 g/dL (ref 3.5–5.0)
ALKALINE PHOSPHATASE: 98 U/L (ref 38–126)
ALT (SGPT): 13 U/L (ref <35)
ANION GAP: 15 mmol/L (ref 7–15)
AST (SGOT): 15 U/L (ref 14–38)
BILIRUBIN TOTAL: 0.6 mg/dL (ref 0.0–1.2)
BLOOD UREA NITROGEN: 19 mg/dL (ref 7–21)
BUN / CREAT RATIO: 22
CALCIUM: 9.7 mg/dL (ref 8.5–10.2)
CO2: 15 mmol/L — ABNORMAL LOW (ref 22.0–30.0)
CREATININE: 0.88 mg/dL (ref 0.60–1.00)
EGFR CKD-EPI AA FEMALE: >90 mL/min/{1.73_m2} (ref >=60)
EGFR CKD-EPI NON-AA FEMALE: 84 mL/min/{1.73_m2} (ref >=60)
GLUCOSE RANDOM: 91 mg/dL (ref 70–179)
POTASSIUM: 4 mmol/L (ref 3.5–5.0)
PROTEIN TOTAL: 7.1 g/dL (ref 6.5–8.3)
SODIUM: 142 mmol/L (ref 135–145)

## 2018-09-09 LAB — C-REACTIVE PROTEIN: C reactive protein:MCnc:Pt:Ser/Plas:Qn:: <5

## 2018-09-09 LAB — POTASSIUM: Potassium:SCnc:Pt:Ser/Plas:Qn:: 4

## 2018-09-09 LAB — SMEAR REVIEW

## 2018-09-09 LAB — PROTIME: Lab: 10.1 — ABNORMAL LOW

## 2018-09-09 LAB — LIPASE: Triacylglycerol lipase:CCnc:Pt:Ser/Plas:Qn:: 73

## 2018-09-09 LAB — MEAN CORPUSCULAR HEMOGLOBIN: Lab: 28.7

## 2018-09-09 LAB — APTT: APTT: 30.1 s (ref 25.9–39.5)

## 2018-09-09 LAB — MAGNESIUM: Magnesium:MCnc:Pt:Ser/Plas:Qn:: 0.9 — CL

## 2018-09-09 LAB — HEPARIN CORRELATION: Lab: 0.2

## 2018-09-09 NOTE — Unmapped (Signed)
[facility]  Emergency Department Provider Note    ED Clinical Impression     Final diagnoses:   None       Initial Impression, ED Course, Assessment and Plan     Impression: 38 y.o. female with PMH of stricturing Crohn's s/p ileocecal resection in 2015, endometriosis, GERD, and HTN who presents with c/o RUQ pain that started 3 days ago in the setting of recent Crohn's flare.    On exam the patient is alert, oriented, generally well appearing, and in NAD. VS are WNL.  Tenderness to palpation in right upper quadrant.    Differential includes likely continued pain from Crohn's disease vs cholelithiasis vs cholecystitis vs pancreatitis.     Plan for CBC, CMP, lipase, magnesium, PT-INR, APTT, and [place] RUQ.     Consulted with GI who recommended no discharge with narcotic pain medications and they will see her in clinic. Her sed rate and CRP show improving disease and she will likely improve with her stelara infusions.      ED Course as of Sep 10 2243   Sun Sep 09, 2018   1451 Unlikely pancreatitis   Lipase: 73   1524 Sed Rate: 7   1537 CRP: <5.0   1539 Negative for biliary pathology   US Abdomen Limited         Additional Medical Decision Making     I reviewed the patient's prior medical records (Previous ED visit and admission on 08/29/18)  I independently visualized the radiology images.       Any labs and radiology results that were available during my care of the patient were independently reviewed by me and considered in my medical decision making.    Portions of this record have been created using Scientist, clinical (histocompatibility and immunogenetics). Dictation errors have been sought, but may not have been identified and corrected.  ____________________________________________    I have reviewed the triage vital signs and the nursing notes. The case was discussed and reviewed with the attending physician, Dr. Duffy Bruce     History     Chief Complaint  Rib Pain      HPI   Mary Hawkins is a 38 y.o. female with PMH of stricturing Crohn's s/p ileocecal resection in 2015, endometriosis, GERD, and HTN who presents with c/o RUQ pain that started 3 days ago in the setting of recent Crohn's flare. The pain has been constant since onset and is worsened with breathing and movement. She has noticed swelling around her abdomen. She denies nausea, vomiting, fevers, chills, or viral symptoms. She reports occasional EtOH use. She denies hx of cholecystectomy.     Per chart review, the pt was previously admitted to [facility] on 1/22 for acute on chronic abdominal pain and diarrhea 2/2 a Crohn's flare. The pain at that time was over to her left sided abdomen. The pt has started Stelera infusions and an entocort taper. She recently discusses her current pain with her GI specialist, Dr. [name]. She was instructed to continue Cymbalta, start mitazapine, hydroxyzine, Lomotil, and continue Entocort until the end of February. The pt was also offered prednisone, but she declined.       Past Medical History:   Diagnosis Date   ??? Anxiety    ??? Arthritis    ??? Back pain    ??? Crohn disease (CMS-HCC)    ??? Eczema    ??? Endometriosis    ??? GERD (gastroesophageal reflux disease)    ??? Headache(784.0)    ???  Hypertension    ??? Ulcerative colitis (CMS-HCC)        Patient Active Problem List   Diagnosis   ??? Abdominal pain, generalized   ??? Rosacea   ??? Attention deficit hyperactivity disorder (ADHD)   ??? Allergic rhinitis   ??? Anxiety state   ??? B-complex deficiency   ??? Pain in joint, shoulder region   ??? Visual disturbances   ??? Brachial neuritis   ??? Backache   ??? Drug dependence, continuous abuse (CMS-HCC)   ??? Cervicalgia   ??? Migraine without aura   ??? Regional enteritis of small intestine (CMS-HCC)   ??? Dermatitis   ??? Endometriosis   ??? Esophageal reflux   ??? Headache   ??? Essential hypertension   ??? Thoracic or lumbosacral neuritis or radiculitis   ??? Pain in joint, multiple sites   ??? Neuralgia and neuritis   ??? Rectal prolapse   ??? Pain in thoracic spine   ??? Crohn's disease (CMS-HCC)   ??? AKI (acute kidney injury) (CMS-HCC)   ??? Leukocytosis   ??? Tobacco use disorder   ??? Major depressive disorder   ??? Erosive esophagitis   ??? Iron deficiency anemia   ??? Enrolled in [facility] GI Behavioral Health Care Management Program       Past Surgical History:   Procedure Laterality Date   ??? BREAST RECONSTRUCTION  2004   ??? ENDOMETRIAL ABLATION     ??? PR COLONOSCOPY FLX DX W/COLLJ SPEC WHEN PFRMD N/A 11/12/2013    Procedure: COLONOSCOPY, FLEXIBLE, PROXIMAL TO SPLENIC FLEXURE; DIAGNOSTIC, W/WO COLLECTION SPECIMEN BY BRUSH OR WASH;  Surgeon: Teodoro Spray, MD;  Location: [facility];  Service: Gastroenterology   ??? PR COLONOSCOPY FLX DX W/COLLJ SPEC WHEN PFRMD N/A 02/20/2014    Procedure: COLONOSCOPY, FLEXIBLE, PROXIMAL TO SPLENIC FLEXURE; DIAGNOSTIC, W/WO COLLECTION SPECIMEN BY BRUSH OR WASH;  Surgeon: Billie Ruddy, MD;  Location: [facility];  Service: Gastroenterology   ??? PR COLONOSCOPY FLX DX W/COLLJ SPEC WHEN PFRMD N/A 12/27/2017    Procedure: COLONOSCOPY, FLEXIBLE, PROXIMAL TO SPLENIC FLEXURE; DIAGNOSTIC, W/WO COLLECTION SPECIMEN BY BRUSH OR WASH;  Surgeon: Bronson Curb, MD;  Location: [facility];  Service: Gastroenterology   ??? PR REMVL COLON & TERM ILEUM W/ILEOCOLOSTOMY N/A 11/13/2013    Procedure: Ileocecectomy, meckel's diverticulectomy, mesh rectopexy;  Surgeon: Emilia Beck, MD;  Location: MAIN OR [place];  Service: Gastrointestinal   ??? PR UPPER GI ENDOSCOPY,BIOPSY N/A 11/12/2013    Procedure: UGI ENDOSCOPY; WITH BIOPSY, SINGLE OR MULTIPLE;  Surgeon: Teodoro Spray, MD;  Location: [facility];  Service: Gastroenterology   ??? PR UPPER GI ENDOSCOPY,BIOPSY N/A 12/27/2017    Procedure: UGI ENDOSCOPY; WITH BIOPSY, SINGLE OR MULTIPLE;  Surgeon: Bronson Curb, MD;  Location: [facility];  Service: Gastroenterology   ??? RECTAL PROLAPSE REPAIR         No current facility-administered medications for this encounter.     Current Outpatient Medications:   ??? acetaminophen (TYLENOL) 500 MG tablet, Take 1,000 mg by mouth every six (6) hours as needed for pain., Disp: , Rfl:   ???  budesonide (ENTOCORT EC) 3 mg 24 hr capsule, Take 9 mg (3 capsules) daily x 1 month, then 6 mg (2 capsules daily) until seen in clinic, Disp: 150 capsule, Rfl: 1  ???  colestipoL (COLESTID) 1 gram tablet, Take 6 tablets (6 g total) by mouth Two (2) times a day., Disp: 360 tablet, Rfl: 0  ???  cyanocobalamin 1,000 mcg/mL injection, Inject 1 mL (1,000 mcg total) under the skin every thirty (30) days., Disp: 4 mL, Rfl: 2  ???  diphenoxylate-atropine (LOMOTIL) 2.5-0.025 mg per tablet, Take 1 tablet by mouth Three (3) times a day as needed for diarrhea., Disp: 60 tablet, Rfl: 1  ???  DULoxetine (CYMBALTA) 60 MG capsule, Take 1 capsule (60 mg total) by mouth Two (2) times a day., Disp: 60 capsule, Rfl: 0  ???  hydrOXYzine (ATARAX) 25 MG tablet, Take 1 tablet (25 mg total) by mouth Three (3) times a day as needed for anxiety., Disp: 90 tablet, Rfl: 1  ???  magnesium oxide (MAG-OX) 400 mg (241.3 mg magnesium) tablet, Take 2 tablets (800 mg total) by mouth Two (2) times a day., Disp: 120 tablet, Rfl: 5  ???  mirtazapine (REMERON) 15 MG tablet, Take 1 tablet (15 mg total) by mouth nightly., Disp: 30 tablet, Rfl: 5  ???  pantoprazole (PROTONIX) 40 MG tablet, Take 1 tablet (40 mg total) by mouth daily., Disp: 30 tablet, Rfl: 0  ???  potassium chloride SA (KLOR-CON) 20 MEQ tablet, Take 1 tablet (20 mEq total) by mouth daily., Disp: 30 tablet, Rfl: 5  ???  syringe with needle, safety (BD SAFETY-LOK DETACHABLE NEEDL) 3 mL 25 gauge x 5/8 Syrg, 1 each by Miscellaneous route every thirty (30) days., Disp: 25 Syringe, Rfl: 0  ???  ustekinumab (STELARA) 90 mg/mL Syrg syringe, Inject the contents of 1 syringe under the skin every 8 weeks, Disp: 1 mL, Rfl: 3    Allergies  Penicillins and Doxycycline hcl    Family History   Problem Relation Age of Onset   ??? Esophageal cancer Maternal Grandfather    ??? Cancer Maternal Grandfather Gastric cancer       Social History  Social History     Tobacco Use   ??? Smoking status: Current Every Day Smoker     Packs/day: 1.00     Types: Cigarettes     Last attempt to quit: 12/06/2017     Years since quitting: 0.7   ??? Smokeless tobacco: Never Used   Substance Use Topics   ??? Alcohol use: Yes     Comment: socially   ??? Drug use: No       Review of Systems   Constitutional: Negative for fever.  Eyes: Negative for visual changes.  ENT: Negative for sore throat.  Cardiovascular: Negative for chest pain.  Respiratory: Negative for shortness of breath.  Gastrointestinal: + RUQ abdominal pain. Negative for vomiting or diarrhea.  Genitourinary: Negative for dysuria.  Musculoskeletal: Negative for back pain.  Skin: Negative for rash.  Neurological: Negative for headaches, focal weakness or numbness.    Physical Exam     ED Triage Vitals   Enc Vitals Group      BP 09/09/18 1339 156/107      Heart Rate 09/09/18 1339 102      SpO2 Pulse --       Resp 09/09/18 1339 18      Temp 09/09/18 1338 36.8 ??C (98.2 ??F)      Temp Source 09/09/18 1338 Skin      SpO2 09/09/18 1339 100 %     Constitutional: Alert and oriented. Uncomfortable appearing due to pain, but in no acute distress.  Eyes: Conjunctivae are normal.  ENT       Head: Normocephalic and atraumatic.       Nose: No congestion.       Mouth/Throat: Mucous membranes are  moist.       Neck: No stridor.  Hematological/Lymphatic/Immunilogical: No cervical lymphadenopathy.  Cardiovascular: Normal rate, regular rhythm. Normal and symmetric distal pulses are present in all extremities.  Respiratory: Normal respiratory effort. Breath sounds are normal.  Gastrointestinal: Tender throughout without rebound or guarding. She states the tenderness is worst in the RUQ.   Musculoskeletal: Normal range of motion in all extremities.       Right lower leg: No tenderness or edema.       Left lower leg: No tenderness or edema.  Neurologic: Normal speech and language. No gross focal neurologic deficits are appreciated.  Skin: Skin is warm, dry and intact. No rash noted.  Psychiatric: Mood and affect are normal. Speech and behavior are normal.      Radiology     US Abdomen Limited   Final Result   Positive Murphy sign. No additional sonographic evidence of acute cholecystitis.            Please see below for data measurements:         Liver: 16.1 cm      Gallbladder wall: 1.8 mm   Sonographic Murphy's Sign: positive   Pericholecystic fluid visualized: no      Common hepatic duct: 0.23 cm   Proximal common bile duct: 0.49 cm   Distal common bile duct: 0.38 cm      Right kidney length: 9.9 cm                I independently visualized these images.    Documentation assistance was provided by Nechama Guard, Scribe, on September 09, 2018 at 1:50 PM for Larina Earthly, MD.    Documentation assistance was provided by the scribe in my presence.  The documentation recorded by the scribe has been reviewed by me and accurately reflects the services I personally performed.               Sanjuana Kava., MD  Resident  09/09/18 [phone]

## 2018-09-09 NOTE — Unmapped (Signed)
Pt was seen here Thursday for R sided rib pain that has now traveled to her L side

## 2018-09-13 MED ORDER — ONDANSETRON 8 MG DISINTEGRATING TABLET
ORAL_TABLET | Freq: Three times a day (TID) | ORAL | 1 refills | 0.00000 days | Status: CP | PRN
Start: 2018-09-13 — End: 2018-11-06
  Filled 2018-10-11: qty 30, 10d supply, fill #0

## 2018-09-13 MED ORDER — ONDANSETRON 8 MG DISINTEGRATING TABLET: 8 mg | tablet | Freq: Three times a day (TID) | 1 refills | 0 days | Status: AC

## 2018-09-13 NOTE — Unmapped (Signed)
Behavioral Health Care Management            Date of Service:  09/13/2018        Date of Last Encounter:  09/06/2018    Purpose of contact:     [place] [facility] Social Worker contacted the patient as part of the [facility]- busy signal at home #/ vm not set up on mobile #.    Follow Up Plan/Next Steps:    []  Schedule follow up appointment with GI Physician  []  Provide referral/linkage information:  []  Consult with GI Physician, re: care plan and patient's treatment progress  [x]  Other: f/u mood      Additional Information/Plan:  Pt was provided with my direct office phone number should additional needs arise.    Future Appointments   Date Time Provider Department Center   11/06/2018 10:00 AM Rosanne Sack, MD [facility]       Ottie Glazier, MSW, M.Ed, LCSWA  Social Worker- MSW  [facility]  [phone]

## 2018-09-24 NOTE — Unmapped (Signed)
Behavioral Health Care Management            Date of Service:  09/24/2018        Date of Last Encounter:  09/13/2018    Purpose of contact:     [place] [facility] Social Worker contacted the patient as part of the [facility]- unable to LVM.    Follow Up Plan/Next Steps:    []  Schedule follow up appointment with GI Physician  []  Provide referral/linkage information:  []  Consult with GI Physician, re: care plan and patient's treatment progress  [x]  Other: f/u mood      Additional Information/Plan:  Pt was provided with my direct office phone number should additional needs arise.    Future Appointments   Date Time Provider Department Center   11/06/2018 10:00 AM Rosanne Sack, MD [facility]       Ottie Glazier, MSW, M.Ed, LCSWA  Social Worker- MSW  [facility]  [phone]

## 2018-10-10 DIAGNOSIS — F411 Generalized anxiety disorder: Principal | ICD-10-CM

## 2018-10-10 DIAGNOSIS — F329 Major depressive disorder, single episode, unspecified: Principal | ICD-10-CM

## 2018-10-10 DIAGNOSIS — F418 Other specified anxiety disorders: Principal | ICD-10-CM

## 2018-10-10 DIAGNOSIS — K50919 Crohn's disease, unspecified, with unspecified complications: Principal | ICD-10-CM

## 2018-10-10 MED ORDER — CYANOCOBALAMIN (VIT B-12) 1,000 MCG/ML INJECTION SOLUTION
SUBCUTANEOUS | 2 refills | 0 days | Status: CP
Start: 2018-10-10 — End: 2019-01-14

## 2018-10-10 MED ORDER — POTASSIUM CHLORIDE ER 20 MEQ TABLET,EXTENDED RELEASE(PART/CRYST)
ORAL_TABLET | Freq: Every day | ORAL | 5 refills | 0.00000 days | Status: CP
Start: 2018-10-10 — End: 2019-10-10

## 2018-10-10 MED ORDER — DIPHENOXYLATE-ATROPINE 2.5 MG-0.025 MG TABLET
ORAL_TABLET | Freq: Three times a day (TID) | ORAL | 1 refills | 0 days | Status: CP | PRN
Start: 2018-10-10 — End: 2019-01-01
  Filled 2018-10-11: qty 60, 20d supply, fill #0

## 2018-10-10 MED ORDER — MAGNESIUM OXIDE 400 MG (241.3 MG MAGNESIUM) TABLET
ORAL_TABLET | Freq: Two times a day (BID) | ORAL | 5 refills | 0.00000 days | Status: CP
Start: 2018-10-10 — End: 2019-10-10
  Filled 2018-10-25: qty 120, 30d supply, fill #0

## 2018-10-10 MED ORDER — BD LUER-LOK SYRINGE 3 ML 25 X 5/8"
0 refills | 0.00000 days | Status: CP
Start: 2018-10-10 — End: 2019-01-01
  Filled 2019-01-01: qty 25, 28d supply, fill #0
  Filled 2019-01-02: qty 3, 90d supply, fill #0

## 2018-10-10 NOTE — Unmapped (Signed)
Labs entered at [facility] per Dr. [name], cbc, cmp, crp and mg

## 2018-10-10 NOTE — Unmapped (Addendum)
Behavioral Health Care Management          Date of Service:  10/10/2018        Date of Last Encounter:  09/25/2018    Purpose of contact:     GI Social Worker contacted the patient as part of the [facility]MP.   Patient has a Depression Composite Score of 4      Mental Status/Behavioral Observations  Affect:  Anxious and Depressed   Mood:   Depressed and Anxious   Thought Process:  Logical, linear, clear, coherent, goal directed   Behavior:   Calm and Polite   Self Harm: 1. not present       Services provided:   []  Behavioral Health Care assessment   []  Behavioral Health Care plan developed collaboratively with patient to be reviewed by GI Physician  [x]  Care Management contact with patient addressing care plan goals     [x]  Coordination/Facilitation of care with other providers  []  PCP, [facility]  [x]  GI Physician, Dr. [name]  []  Consulting Psychiatrist, Dr. Lucienne Capers  [x]  Treatment progress monitoring/review:   [x]  Administered PHQ-9/GAD-7 or other applicable validated rating scale   [x]  Reviewed results of recent PHQ-9/GAD -7 with patient   []  Patient making progress- no revision to care plan needed   []  Revisions to care plan:       Specific Interventions with patient during this encounter:   []  Not applicable  []  Problem Solving Skills  []  Behavioral Activation  []  Brief Distress Tolerance Skill building  []  Mindfulness based stress management skills  []  Non-pharmacological approaches to improving sleep  []  Assist in development of a Crisis Management plan  [x]  Motivational Interviewing  [x]  Brief CBT   []  Other:     Progress/Outcome:    PHQ-9 PHQ-9 TOTAL SCORE   10/10/2018 17   08/14/2018 13        GAD7 Total Score GAD-7 Total Score   10/10/2018 21   08/14/2018 11         Pt reported that they have not noticed any effects from their current regimen. They've been experiencing increased anxiety symptoms with daily panic attacks that occur with no known cause. She is currently taking Cymbalta 60 mg in the morning, Atarax 25 mg TID, and Remeron 15 mg nightly for mood. She has been out of her Cymbalta since this past Friday- message sent to alert Dr. [name]. Provided Cognitive Behavioral Therapy focused on assisting Chauna in gaining understanding of the emotional, cognitive and behavioral components of anxiety. Identified irrational beliefs and cognitive distortions; assisted her in learning to challenge irrational beliefs and cognitive distortions and replace them with more realistic self statements. She reported that she constantly feels shaky and nervous in the context of not feeling well due to her Crohn's Disease. She continues to have a hard time relaxing and is a constant state of worry due to how often she needs to use the restroom and the discomfort and swelling she experiences after eating. Will f/u with pt after consulting with psychiatrist to further discuss anxiety, distress tolerance, and to teach her muscle relaxation through guided meditation.    Follow Up Plan/Next Steps:    []  Schedule follow up appointment with GI Physician  []  Provide referral/linkage information:  [x]  Consult with GI Physician, re: care plan and patient's treatment progress  []  Other:        Additional Information/Plan:  Pt was provided with my direct office phone number should additional needs  arise.    Future Appointments   Date Time Provider Department Center   11/06/2018 10:00 AM Rosanne Sack, MD [facility]       Ottie Glazier, MSW, M.Ed, LCSWA  Social Worker- [facility]  [phone]

## 2018-10-10 NOTE — Unmapped (Signed)
New Stelara RX on file. Unable to test claim due to product cost is greater than $9,999.00. Prior Authorization is valid until 01/23/2019.

## 2018-10-11 DIAGNOSIS — F418 Other specified anxiety disorders: Principal | ICD-10-CM

## 2018-10-11 DIAGNOSIS — F411 Generalized anxiety disorder: Principal | ICD-10-CM

## 2018-10-11 DIAGNOSIS — F329 Major depressive disorder, single episode, unspecified: Principal | ICD-10-CM

## 2018-10-11 MED ORDER — DULOXETINE 60 MG CAPSULE,DELAYED RELEASE
ORAL_CAPSULE | Freq: Two times a day (BID) | ORAL | 5 refills | 0 days | Status: CP
Start: 2018-10-11 — End: 2018-11-06

## 2018-10-11 MED ORDER — HYDROXYZINE (ATARAX) 50 MG CAPSULE/TABLET WRAPPER
ORAL_TABLET | Freq: Three times a day (TID) | ORAL | 5 refills | 0 days | Status: CP | PRN
Start: 2018-10-11 — End: 2018-11-06

## 2018-10-11 MED ORDER — MIRTAZAPINE 30 MG TABLET
ORAL_TABLET | Freq: Every evening | ORAL | 5 refills | 0.00000 days | Status: CP
Start: 2018-10-11 — End: 2018-11-07

## 2018-10-11 MED FILL — ERGOCALCIFEROL (VITAMIN D2) 1,250 MCG (50,000 UNIT) CAPSULE: 28 days supply | Qty: 4 | Fill #0 | Status: AC

## 2018-10-11 MED FILL — ONDANSETRON 8 MG DISINTEGRATING TABLET: 10 days supply | Qty: 30 | Fill #0 | Status: AC

## 2018-10-11 MED FILL — ERGOCALCIFEROL (VITAMIN D2) 1,250 MCG (50,000 UNIT) CAPSULE: 28 days supply | Qty: 4 | Fill #0

## 2018-10-11 MED FILL — DIPHENOXYLATE-ATROPINE 2.5 MG-0.025 MG TABLET: 20 days supply | Qty: 60 | Fill #0 | Status: AC

## 2018-10-11 NOTE — Unmapped (Signed)
Behavioral Health Care Management          Date of Service:  10/11/2018        Date of Last Encounter:  10/10/2018    Purpose of contact:     GI Social Worker contacted the patient as part of the B[facility]MP.   Patient has a Depression Composite Score of 6      Services provided:   []  Behavioral Health Care assessment   []  Behavioral Health Care plan developed collaboratively with patient to be reviewed by GI Physician  []  Care Management contact with patient addressing care plan goals     [x]  Coordination/Facilitation of care with other providers  []  PCP, [facility]  [x]  GI Physician, Dr. [name]  [x]  Consulting Psychiatrist, Dr. Lucienne Capers  []  Treatment progress monitoring/review:   []  Administered PHQ-9/GAD-7 or other applicable validated rating scale   []  Reviewed results of recent PHQ-9/GAD -7 with patient   []  Patient making progress- no revision to care plan needed   []  Revisions to care plan:       Specific Interventions with patient during this encounter:   [x]  Not applicable  []  Problem Solving Skills  []  Behavioral Activation  []  Brief Distress Tolerance Skill building  []  Mindfulness based stress management skills  []  Non-pharmacological approaches to improving sleep  []  Assist in development of a Crisis Management plan  []  Motivational Interviewing  []  Brief CBT   []  Other:     Progress/Outcome:    PHQ-9 PHQ-9 TOTAL SCORE   10/10/2018 17   08/14/2018 13        GAD7 Total Score GAD-7 Total Score   10/10/2018 21   08/14/2018 11         Contacted pt to f/u on case review with consulting psychiatrist. Pt confirmed that she would like to continue Cymbalta for now. Pt reported that before running out, they were taking Cymbalta 60 mg BID, which is a correction from her self report yesterday of taking Cymbalta 60 mg once daily.    Follow Up Plan/Next Steps:    []  Schedule follow up appointment with GI Physician  []  Provide referral/linkage information:  [x]  Consult with GI Physician, re: care plan and patient's treatment progress  []  Other:        Additional Information/Plan:  Pt was provided with my direct office phone number should additional needs arise.    Future Appointments   Date Time Provider Department Center   11/06/2018 10:00 AM Rosanne Sack, MD [facility]       Ottie Glazier, MSW, M.Ed, LCSWA  Social Worker- [facility]  [phone]

## 2018-10-11 NOTE — Unmapped (Signed)
Behavioral Health Collaborative Care Management Psychiatric Consultation    Consultation with [facility] Manager in regards to patient's behavioral health needs. Reviewed [facility] care plan and discussed patient treatment progress.    38 year old with a history of Crohn's disease and chronic pain, as well as depression and anxiety.  Recently reported anxiety attacks.      The patient was seen last month in the ED for RUQ pain, but was sent home without significant intervention.  She continues to report ongoing anxiety.  She reportedly did start taking Mirtazapine 15 mg as one of my prior recommendations (script sent 09/07/18, but unclear when she started).  She ran out of Duloxetine about 5 days ago.  There is confusion as to what dose she should be taking - she reports taking 60 mg qDay, but script is for 60 mg BID.  She has been taking 25 mg of Hydroxyzine 3-4 times a day.  She reports poor sleep and worsening anxiety in the setting of her physical health complaints.      Revision to care plan recommended as follows:     Recommendations dependent on whether the patient wishes to remain on Duloxetine.  As previously recommended in my note, if she does not wish to take this medication anymore, she can stop it (no need to taper given she has been off for 5 days now).  However, if she wishes to continue this medication, go ahead and restart at 60 mg dose should be fine, since she has only been off for a few days.  I will say that some of her increased anxiety recently may be related to abruptly stopping this medication.  Regardless of whether she wishes to continue Duloxetine or not, I recommend increasing Mirtazapine dose.    1) Increase Mirtazapine to 30 mg qHS to target anxiety and sleep.    2) Can increase Hydroxyzine to 50 mg dose at a time, but would not take more than 3 doses in a day.    3) See above regarding my thoughts on Duloxetine.    4) Care manager to work on behavioral strategies to target anxiety with patient.     5) As stated previously, I would avoid benzodiazepines for this patient.    This case review was conducted with information gathered from chart and from care manager information.  I have not examined the patient directly.  Physician should incorporate recommendations as appropriate given full current clinical picture.

## 2018-10-15 MED ORDER — ONDANSETRON 8 MG DISINTEGRATING TABLET
ORAL_TABLET | 1 refills | 0 days | Status: CP
Start: 2018-10-15 — End: 2018-11-06

## 2018-10-17 NOTE — Unmapped (Signed)
[facility]   Patient Onboarding/Medication Counseling    Mary Hawkins is a 38 y.o. female with Crohn's Disease who I am counseling today on initiation of therapy.  I am speaking to the patient    Verified patient's date of birth / HIPAA.    Specialty medication(s) to be sent: Inflammatory Disorders: Stelara      Non-specialty medications/supplies to be sent: sharps kit       Stelara (ustekinumab)    Medication & Administration     Dosage: Crohn's disease: Inject 90mg  under the skin every 8 weeks staring 8 weeks after IV induction dose    Lab tests required prior to treatment initiation:  ? Tuberculosis: Tuberculosis screening resulted in a non-reactive Quantiferon TB Gold assay.    Administration:     Prefilled syringe  1. Gather all supplies needed for injection on a clean, flat working surface: medication syringe(s) removed from packaging, alcohol swab, sharps container, etc.  2. Look at the medication label ??? look for correct medication, correct dose, and check the expiration date  3. Look at the medication ??? the liquid in the syringe should appear clear and colorless to slightly yellow, you may see a few white particles  4. Lay the syringe on a flat surface and allow it to warm up to room temperature for at least 15-30 minutes  5. Select injection site ??? you can use the front of your thigh or your belly (but not the area 2 inches around your belly button); if someone else is giving you the injection you can also use your upper arm in the skin covering your triceps muscle or in the buttocks  6. Prepare injection site ??? wash your hands and clean the skin at the injection site with an alcohol swab and let it air dry, do not touch the injection site again before the injection  7. Pull off the needle safety cap, do not remove until immediately prior to injection  8. Pinch the skin ??? with your hand not holding the syringe pinch up a fold of skin at the injection site using your forefinger and thumb  9. Insert the needle into the fold of skin at about a 45 degree angle ??? it's best to use a quick dart-like motion  10. Push the plunger down slowly as far as it will go until the syringe is empty, if the plunger is not fully depressed the needle shield will not extend to cover the needle when it is removed, hold the syringe in place for a full 5 seconds  11. Check that the syringe is empty and keep pressing down on the plunger while you pull the needle out at the same angle as inserted; after the needle is removed completely from the skin, release the plunger allowing the needle shield to activate and cover the used needle  12. Dispose of the used syringe immediately in your sharps disposal container, do not attempt to recap the needle prior to disposing  13. If you see any blood at the injection site, press a cotton ball or gauze on the site and maintain pressure until the bleeding stops, do not rub the injection site      Adherence/Missed dose instructions:  If your injection is given more than 7-10 days after your scheduled injection date ??? consult your pharmacist for additional instructions on how to adjust your dosing schedule.    Goals of Therapy     ? Achieve remission of symptoms  ?  Maintain remission of symptoms  ? Minimize long-term systemic glucocorticoid use  ? Prevent need for surgical procedures  ? Maintenance of effective psychosocial functioning    Side Effects & Monitoring Parameters     ? Injection site reaction (redness, irritation, inflammation localized to the site of administration)  ? Signs of a common cold ??? minor sore throat, runny or stuffy nose, etc.  ? Feeling tired or weak  ? Headache    The following side effects should be reported to the provider:  ? Signs of a hypersensitivity reaction ??? rash; hives; itching; red, swollen, blistered, or peeling skin; wheezing; tightness in the chest or throat; difficulty breathing, swallowing, or talking; swelling of the mouth, face, lips, tongue, or throat; etc.  ? Reduced immune function ??? report signs of infection such as fever; chills; body aches; very bad sore throat; ear or sinus pain; cough; more sputum or change in color of sputum; pain with passing urine; wound that will not heal, etc.  Also at a slightly higher risk of some malignancies (mainly skin and blood cancers) due to this reduced immune function.  o In the case of signs of infection ??? the patient should hold the next dose of Stelara?? and call your primary care provider to ensure adequate medical care.  Treatment may be resumed when infection is treated and patient is asymptomatic.  ? Changes in skin ??? a new growth or lump that forms; changes in shape, size, or color of a previous mole or marking  ? Shortness of breath or chest pain  ? Vaginal itching or discharge      Contraindications, Warnings, & Precautions     ? Have your bloodwork checked as you have been told by your prescriber  ? Talk with your doctor if you are pregnant, planning to become pregnant, or breastfeeding  ? Discuss the possible need for holding your dose(s) of Stelara?? when a planned procedure is scheduled with the prescriber as it may delay healing/recovery timeline       Drug/Food Interactions     ? Medication list reviewed in Epic. The patient was instructed to inform the care team before taking any new medications or supplements. No drug interactions identified.   ? If you have a latex allergy use caution when handling, the needle cap of the Stelara?? prefilled syringe contains a derivative of natural rubber latex  ? Talk with you prescriber or pharmacist before receiving any live vaccinations while taking this medication and after you stop taking it    Storage, Handling Precautions, & Disposal     ? Store this medication in the refrigerator.  Do not freeze  ? If needed, you may store at room temperature for up to 4 hours  ? Store in [employer], protected from light  ? Do not shake  ? Dispose of used syringes/pens in a sharps disposal container    Current Medications (including OTC/herbals), Comorbidities and Allergies     Current Outpatient Medications   Medication Sig Dispense Refill   ??? acetaminophen (TYLENOL) 500 MG tablet Take 1,000 mg by mouth every six (6) hours as needed for pain.     ??? colestipoL (COLESTID) 1 gram tablet Take 6 tablets (6 g total) by mouth Two (2) times a day. 360 tablet 0   ??? cyanocobalamin 1,000 mcg/mL injection Inject 1 mL (1,000 mcg total) under the skin every thirty (30) days. 4 mL 2   ??? diphenoxylate-atropine (LOMOTIL) 2.5-0.025 mg per tablet Take 1 tablet by  mouth Three (3) times a day as needed for diarrhea. 60 tablet 1   ??? DULoxetine (CYMBALTA) 60 MG capsule Take 1 capsule (60 mg total) by mouth Two (2) times a day. 60 capsule 5   ??? ergocalciferol (DRISDOL) 50,000 unit capsule Take 1 capsule (50,000 Units total) by mouth once a week. 8 capsule 0   ??? ergocalciferol (VITAMIN D2) 1,250 mcg (50,000 unit) capsule TAKE 1 CAPSULE BY MOUTH ONCE A WEEK 4 capsule 0   ??? hydrOXYzine (ATARAX) 50 mg capsule/tablet Take 1 each (50 mg total) by mouth Three (3) times a day as needed for anxiety. 90 tablet 5   ??? magnesium oxide (MAG-OX) 400 mg (241.3 mg magnesium) tablet Take 2 tablets (800 mg total) by mouth Two (2) times a day. 120 tablet 5   ??? mirtazapine (REMERON) 30 MG tablet Take 1 tablet (30 mg total) by mouth nightly. 30 tablet 5   ??? ondansetron (ZOFRAN-ODT) 8 MG disintegrating tablet Take 1 tablet (8 mg total) by mouth every eight (8) hours as needed for nausea for up to 14 days. 30 tablet 1   ??? ondansetron (ZOFRAN-ODT) 8 MG disintegrating tablet DISSOLVE 1 TABLET ON THE TONGUE EVERY 8 HOURS AS NEEDED FOR NAUSEA 30 tablet 1   ??? potassium chloride (KLOR-CON) 20 MEQ CR tablet Take 1 tablet (20 mEq total) by mouth daily. 30 tablet 5   ??? potassium chloride SA (K-DUR,KLOR-CON) 20 MEQ tablet Take 1 tablet (20 mEq total) by mouth Two (2) times a day. 60 tablet 2   ??? syringe with needle, safety (BD SAFETY-LOK DETACHABLE NEEDL) 3 mL 25 gauge x 5/8 Syrg 1 each by Miscellaneous route every thirty (30) days. 25 each 0   ??? ustekinumab (STELARA) 90 mg/mL Syrg syringe Inject the contents of 1 syringe under the skin every 8 weeks 1 mL 3     No current facility-administered medications for this visit.        Allergies   Allergen Reactions   ??? Penicillins Hives     Has patient had a PCN reaction causing immediate rash, facial/tongue/throat swelling, SOB or lightheadedness with hypotension: Yes  Has patient had a PCN reaction causing severe rash involving mucus membranes or skin necrosis: Unk  Has patient had a PCN reaction that required hospitalization: Yes  Has patient had a PCN reaction occurring within the last 10 years: No  If all of the above answers are NO, then may proceed with Cephalosporin use.  Has patient had a PCN reaction causing immediate rash, facial/tongue/throat swelling, SOB or lightheadedness with hypotension: Yes  Has patient had a PCN reaction causing severe rash involving mucus membranes or skin necrosis: Unk  Has patient had a PCN reaction that required hospitalization: Yes  Has patient had a PCN reaction occurring within the last 10 years: No  If all of the above answers are NO, then may proceed with Cephalosporin use.   ??? Doxycycline Hcl Other (See Comments)     Reaction not recalled       Patient Active Problem List   Diagnosis   ??? Abdominal pain, generalized   ??? Rosacea   ??? Attention deficit hyperactivity disorder (ADHD)   ??? Allergic rhinitis   ??? Anxiety state   ??? B-complex deficiency   ??? Pain in joint, shoulder region   ??? Visual disturbances   ??? Brachial neuritis   ??? Backache   ??? Drug dependence, continuous abuse (CMS-HCC)   ??? Cervicalgia   ??? Migraine without aura   ???  Regional enteritis of small intestine (CMS-HCC)   ??? Dermatitis   ??? Endometriosis   ??? Esophageal reflux   ??? Headache   ??? Essential hypertension   ??? Thoracic or lumbosacral neuritis or radiculitis   ??? Pain in joint, multiple sites   ??? Neuralgia and neuritis   ??? Rectal prolapse   ??? Pain in thoracic spine   ??? Crohn's disease (CMS-HCC)   ??? AKI (acute kidney injury) (CMS-HCC)   ??? Leukocytosis   ??? Tobacco use disorder   ??? Major depressive disorder   ??? Erosive esophagitis   ??? Iron deficiency anemia   ??? Enrolled in [facility] Management Program       Reviewed and up to date in Epic.    Appropriateness of Therapy     Is medication and dose appropriate based on diagnosis? Yes    Baseline Quality of Life Assessment      How many days over the past month did your Crohn's Disease keep you from your normal activities? 20    Financial Information     Medication Assistance provided: Prior Authorization    Anticipated copay of $3.00 reviewed with patient. Verified delivery address.    Delivery Information     Scheduled delivery date: 10/25/2018    Expected start date: 10/30/2018    Medication will be delivered via UPS to the home address in [facility].  This shipment will not require a signature.      Explained the services we provide at [facility] and that each month we would call to set up refills.  Stressed importance of returning phone calls so that we could ensure they receive their medications in time each month.  Informed patient that we should be setting up refills 7-10 days prior to when they will run out of medication.  A pharmacist will reach out to perform a clinical assessment periodically.  Informed patient that a welcome packet and a drug information handout will be sent.      Patient verbalized understanding of the above information as well as how to contact the pharmacy at [phone] option 4 with any questions/concerns.  The pharmacy is open Monday through Friday 8:30am-4:30pm.  A pharmacist is available 24/7 via pager to answer any clinical questions they may have.    Patient Specific Needs     ? Does the patient have any physical, cognitive, or cultural barriers? No    ? Patient prefers to have medications discussed with  Patient     ? Is the patient able to read and understand education materials at a high school level or above? Yes    ? Patient's primary language is  English     ? Is the patient high risk? No     ? Does the patient require a Care Management Plan? No     ? Does the patient require physician intervention or other additional services (i.e. nutrition, smoking cessation, social work)? No      Karene Fry Nelline Lio  [facility] Specialty Pharmacist

## 2018-10-19 ENCOUNTER — Observation Stay (HOSPITAL_COMMUNITY)
Admission: EM | Admit: 2018-10-19 | Discharge: 2018-10-21 | Disposition: A | Discharge status: Home / Self Care | Payer: Medicaid Other | Attending: Internal Medicine | Admitting: Internal Medicine

## 2018-10-19 ENCOUNTER — Other Ambulatory Visit: Payer: Self-pay

## 2018-10-19 DIAGNOSIS — D72829 Elevated white blood cell count, unspecified: Secondary | ICD-10-CM | POA: Diagnosis not present

## 2018-10-19 DIAGNOSIS — F1010 Alcohol abuse, uncomplicated: Secondary | ICD-10-CM | POA: Insufficient documentation

## 2018-10-19 DIAGNOSIS — I609 Nontraumatic subarachnoid hemorrhage, unspecified: Principal | ICD-10-CM

## 2018-10-19 DIAGNOSIS — F17210 Nicotine dependence, cigarettes, uncomplicated: Secondary | ICD-10-CM | POA: Insufficient documentation

## 2018-10-19 DIAGNOSIS — N289 Disorder of kidney and ureter, unspecified: Secondary | ICD-10-CM

## 2018-10-19 DIAGNOSIS — Z79899 Other long term (current) drug therapy: Secondary | ICD-10-CM | POA: Insufficient documentation

## 2018-10-19 DIAGNOSIS — I10 Essential (primary) hypertension: Secondary | ICD-10-CM | POA: Insufficient documentation

## 2018-10-19 DIAGNOSIS — R569 Unspecified convulsions: Secondary | ICD-10-CM | POA: Diagnosis present

## 2018-10-19 DIAGNOSIS — E876 Hypokalemia: Secondary | ICD-10-CM | POA: Diagnosis not present

## 2018-10-19 DIAGNOSIS — F419 Anxiety disorder, unspecified: Secondary | ICD-10-CM | POA: Insufficient documentation

## 2018-10-19 DIAGNOSIS — D75839 Thrombocytosis, unspecified: Secondary | ICD-10-CM | POA: Diagnosis present

## 2018-10-19 DIAGNOSIS — D473 Essential (hemorrhagic) thrombocythemia: Secondary | ICD-10-CM | POA: Diagnosis present

## 2018-10-19 DIAGNOSIS — F909 Attention-deficit hyperactivity disorder, unspecified type: Secondary | ICD-10-CM | POA: Insufficient documentation

## 2018-10-19 DIAGNOSIS — K5090 Crohn's disease, unspecified, without complications: Secondary | ICD-10-CM | POA: Diagnosis not present

## 2018-10-19 DIAGNOSIS — Z23 Encounter for immunization: Secondary | ICD-10-CM | POA: Diagnosis not present

## 2018-10-19 DIAGNOSIS — K50919 Crohn's disease, unspecified, with unspecified complications: Secondary | ICD-10-CM | POA: Diagnosis present

## 2018-10-19 MED ORDER — HYDROCODONE-ACETAMINOPHEN 5-325 MG PO TABS
1.0000 | ORAL_TABLET | Freq: Once | ORAL | Status: AC
  Administered 2018-10-19: 1 via ORAL
  Filled 2018-10-19: qty 1

## 2018-10-19 NOTE — ED Triage Notes (Signed)
Pt brought in by EMS after a syncopal episode witnessed by her son. Pt states that she had no symptoms prior to the incident and that she does not know how long she was unconscious for. Pt may have hit her face on the ground as she has two chipped bottom front teeth from this incident. Pt has a hx of chrons disease and states that her only pain is her normal chronic pain. Vitals prior to arrival: BP 160/114, HR 116, SpO2 95% RA, CBG 114. Pt has been A + O x 4 since ems arrival to the scene.
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
## 2018-10-20 ENCOUNTER — Other Ambulatory Visit: Payer: Self-pay

## 2018-10-20 ENCOUNTER — Encounter (HOSPITAL_COMMUNITY): Payer: Self-pay | Admitting: Family Medicine

## 2018-10-20 ENCOUNTER — Emergency Department (HOSPITAL_COMMUNITY): Payer: Medicaid Other

## 2018-10-20 DIAGNOSIS — D72829 Elevated white blood cell count, unspecified: Secondary | ICD-10-CM

## 2018-10-20 DIAGNOSIS — D473 Essential (hemorrhagic) thrombocythemia: Secondary | ICD-10-CM

## 2018-10-20 DIAGNOSIS — N289 Disorder of kidney and ureter, unspecified: Secondary | ICD-10-CM

## 2018-10-20 DIAGNOSIS — R569 Unspecified convulsions: Secondary | ICD-10-CM | POA: Diagnosis not present

## 2018-10-20 DIAGNOSIS — K50919 Crohn's disease, unspecified, with unspecified complications: Secondary | ICD-10-CM

## 2018-10-20 DIAGNOSIS — I609 Nontraumatic subarachnoid hemorrhage, unspecified: Secondary | ICD-10-CM

## 2018-10-20 DIAGNOSIS — E876 Hypokalemia: Secondary | ICD-10-CM

## 2018-10-20 LAB — URINALYSIS, ROUTINE W REFLEX MICROSCOPIC
Bilirubin Urine: NEGATIVE
Glucose, UA: NEGATIVE mg/dL
Hgb urine dipstick: NEGATIVE
Ketones, ur: NEGATIVE mg/dL
Leukocytes,Ua: NEGATIVE
Nitrite: NEGATIVE
Protein, ur: NEGATIVE mg/dL
Specific Gravity, Urine: 1.009 (ref 1.005–1.030)
pH: 6 (ref 5.0–8.0)

## 2018-10-20 LAB — BASIC METABOLIC PANEL
Anion gap: 7 (ref 5–15)
BUN: 11 mg/dL (ref 6–20)
CO2: 13 mmol/L — ABNORMAL LOW (ref 22–32)
Calcium: 6.5 mg/dL — ABNORMAL LOW (ref 8.9–10.3)
Chloride: 117 mmol/L — ABNORMAL HIGH (ref 98–111)
Creatinine, Ser: 1.1 mg/dL — ABNORMAL HIGH (ref 0.44–1.00)
GFR calc Af Amer: >60 mL/min (ref >60)
GFR calc non Af Amer: >60 mL/min (ref >60)
Glucose, Bld: 101 mg/dL — ABNORMAL HIGH (ref 70–99)
Potassium: 3.7 mmol/L (ref 3.5–5.1)
Sodium: 137 mmol/L (ref 135–145)

## 2018-10-20 LAB — CBC WITH DIFFERENTIAL/PLATELET
Abs Immature Granulocytes: 0.61 10*3/uL — ABNORMAL HIGH (ref 0.00–0.07)
Abs Immature Granulocytes: 0.73 10*3/uL — ABNORMAL HIGH (ref 0.00–0.07)
Basophils Absolute: 0.1 10*3/uL (ref 0.0–0.1)
Basophils Absolute: 0.2 10*3/uL — ABNORMAL HIGH (ref 0.0–0.1)
Basophils Relative: 1 %
Basophils Relative: 1 %
Eosinophils Absolute: 0.1 10*3/uL (ref 0.0–0.5)
Eosinophils Absolute: 0.2 10*3/uL (ref 0.0–0.5)
Eosinophils Relative: 1 %
Eosinophils Relative: 1 %
HCT: 37.9 % (ref 36.0–46.0)
HCT: 40.7 % (ref 36.0–46.0)
Hemoglobin: 12.1 g/dL (ref 12.0–15.0)
Hemoglobin: 13.6 g/dL (ref 12.0–15.0)
Immature Granulocytes: 3 %
Immature Granulocytes: 3 %
Lymphocytes Relative: 10 %
Lymphocytes Relative: 19 %
Lymphs Abs: 2.3 10*3/uL (ref 0.7–4.0)
Lymphs Abs: 3.7 10*3/uL (ref 0.7–4.0)
MCH: 28.7 pg (ref 26.0–34.0)
MCH: 29.8 pg (ref 26.0–34.0)
MCHC: 31.9 g/dL (ref 30.0–36.0)
MCHC: 33.4 g/dL (ref 30.0–36.0)
MCV: 89.1 fL (ref 80.0–100.0)
MCV: 90 fL (ref 80.0–100.0)
Monocytes Absolute: 1 10*3/uL (ref 0.1–1.0)
Monocytes Absolute: 1.1 10*3/uL — ABNORMAL HIGH (ref 0.1–1.0)
Monocytes Relative: 5 %
Monocytes Relative: 5 %
Neutro Abs: 13.8 10*3/uL — ABNORMAL HIGH (ref 1.7–7.7)
Neutro Abs: 17.3 10*3/uL — ABNORMAL HIGH (ref 1.7–7.7)
Neutrophils Relative %: 71 %
Neutrophils Relative %: 80 %
Platelets: 441 10*3/uL — ABNORMAL HIGH (ref 150–400)
Platelets: 485 10*3/uL — ABNORMAL HIGH (ref 150–400)
RBC: 4.21 MIL/uL (ref 3.87–5.11)
RBC: 4.57 MIL/uL (ref 3.87–5.11)
RDW: 19.1 % — ABNORMAL HIGH (ref 11.5–15.5)
RDW: 19.3 % — ABNORMAL HIGH (ref 11.5–15.5)
WBC: 19.5 10*3/uL — ABNORMAL HIGH (ref 4.0–10.5)
WBC: 21.6 10*3/uL — ABNORMAL HIGH (ref 4.0–10.5)
nRBC: 0 % (ref 0.0–0.2)
nRBC: 0 % (ref 0.0–0.2)

## 2018-10-20 LAB — COMPREHENSIVE METABOLIC PANEL
ALT: 12 U/L (ref 0–44)
ALT: 13 U/L (ref 0–44)
AST: 20 U/L (ref 15–41)
AST: 20 U/L (ref 15–41)
Albumin: 3 g/dL — ABNORMAL LOW (ref 3.5–5.0)
Albumin: 3.5 g/dL (ref 3.5–5.0)
Alkaline Phosphatase: 71 U/L (ref 38–126)
Alkaline Phosphatase: 72 U/L (ref 38–126)
Anion gap: 10 (ref 5–15)
Anion gap: 7 (ref 5–15)
BUN: 15 mg/dL (ref 6–20)
BUN: 17 mg/dL (ref 6–20)
CO2: 13 mmol/L — ABNORMAL LOW (ref 22–32)
CO2: 14 mmol/L — ABNORMAL LOW (ref 22–32)
Calcium: 6.4 mg/dL — CL (ref 8.9–10.3)
Calcium: 6.8 mg/dL — ABNORMAL LOW (ref 8.9–10.3)
Chloride: 116 mmol/L — ABNORMAL HIGH (ref 98–111)
Chloride: 117 mmol/L — ABNORMAL HIGH (ref 98–111)
Creatinine, Ser: 1.17 mg/dL — ABNORMAL HIGH (ref 0.44–1.00)
Creatinine, Ser: 1.18 mg/dL — ABNORMAL HIGH (ref 0.44–1.00)
GFR calc Af Amer: >60 mL/min (ref >60)
GFR calc Af Amer: >60 mL/min (ref >60)
GFR calc non Af Amer: 59 mL/min — ABNORMAL LOW (ref >60)
GFR calc non Af Amer: 59 mL/min — ABNORMAL LOW (ref >60)
Glucose, Bld: 118 mg/dL — ABNORMAL HIGH (ref 70–99)
Glucose, Bld: 92 mg/dL (ref 70–99)
Potassium: 2.8 mmol/L — ABNORMAL LOW (ref 3.5–5.1)
Potassium: 2.9 mmol/L — ABNORMAL LOW (ref 3.5–5.1)
Sodium: 137 mmol/L (ref 135–145)
Sodium: 140 mmol/L (ref 135–145)
Total Bilirubin: 0.5 mg/dL (ref 0.3–1.2)
Total Bilirubin: 0.9 mg/dL (ref 0.3–1.2)
Total Protein: 5.4 g/dL — ABNORMAL LOW (ref 6.5–8.1)
Total Protein: 6.3 g/dL — ABNORMAL LOW (ref 6.5–8.1)

## 2018-10-20 LAB — RAPID URINE DRUG SCREEN, HOSP PERFORMED
Amphetamines: NOT DETECTED
Barbiturates: NOT DETECTED
Benzodiazepines: POSITIVE — AB
Cocaine: NOT DETECTED
Opiates: POSITIVE — AB
Tetrahydrocannabinol: NOT DETECTED

## 2018-10-20 LAB — MAGNESIUM
Magnesium: 0.7 mg/dL — CL (ref 1.7–2.4)
Magnesium: 0.7 mg/dL — CL (ref 1.7–2.4)

## 2018-10-20 IMAGING — CT CT CERVICAL SPINE WITHOUT CONTRAST
4 of 8 series · 11 of 33 positions shown, 12 images · non-contrast
Comparison: CT head [DATE]

CLINICAL DATA: Syncopal episode. Headache.

EXAM:
CT HEAD WITHOUT CONTRAST
CT CERVICAL SPINE WITHOUT CONTRAST
TECHNIQUE: Multidetector CT imaging of the head and cervical spine was
performed following the standard protocol without intravenous
contrast. Multiplanar CT image reconstructions of the cervical spine
were also generated.

[Series 8: c_spine 2.0 st · axial · 0.41mm/px · z∈[-234,-122]mm · 3 of 112 slices shown, 4 images]
[im 28/112  soft-tissue]
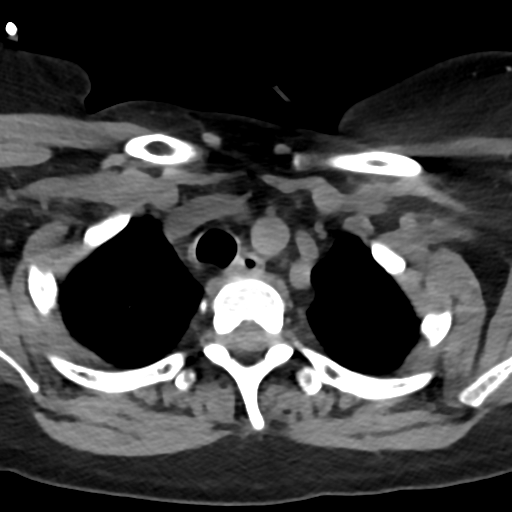
[im 28/112  bone]
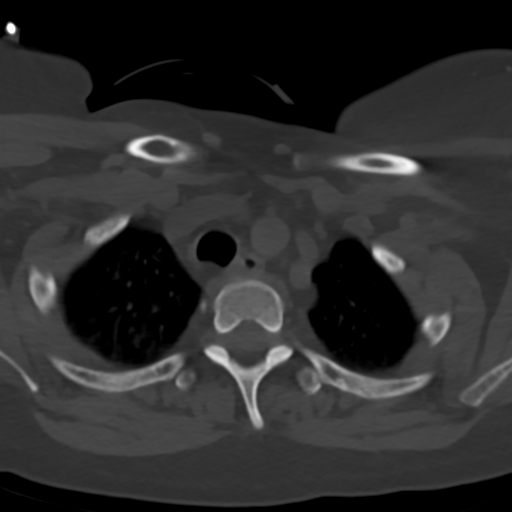
[im 56/112  bone]
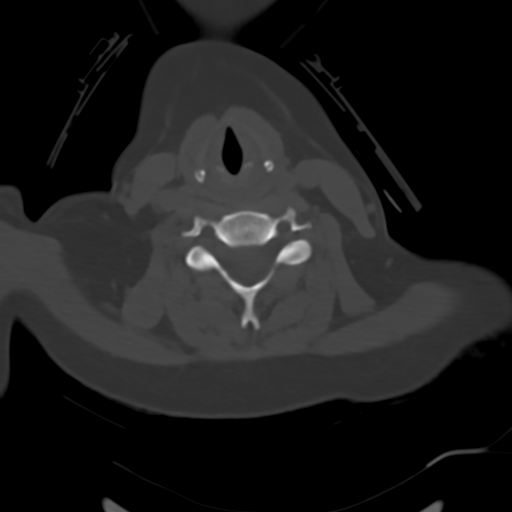
[im 84/112  bone]
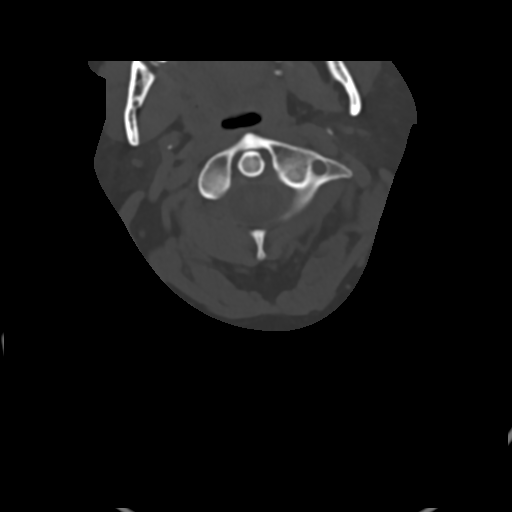

[Series 10: c_spine 2.0 sag bone · sagittal · 0.33mm/px · 4 of 61 slices shown]
[im 13/61  bone]
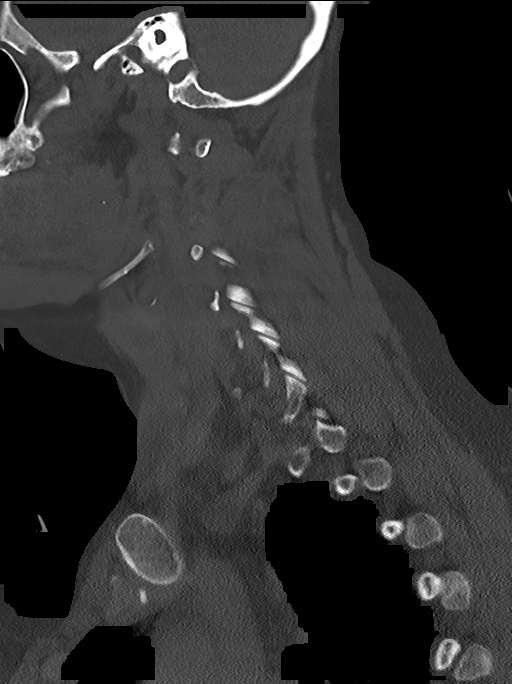
[im 25/61  bone]
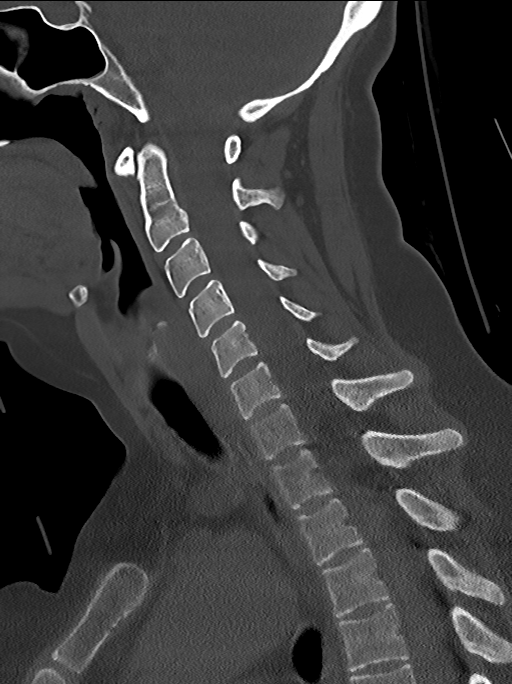
[im 37/61  bone]
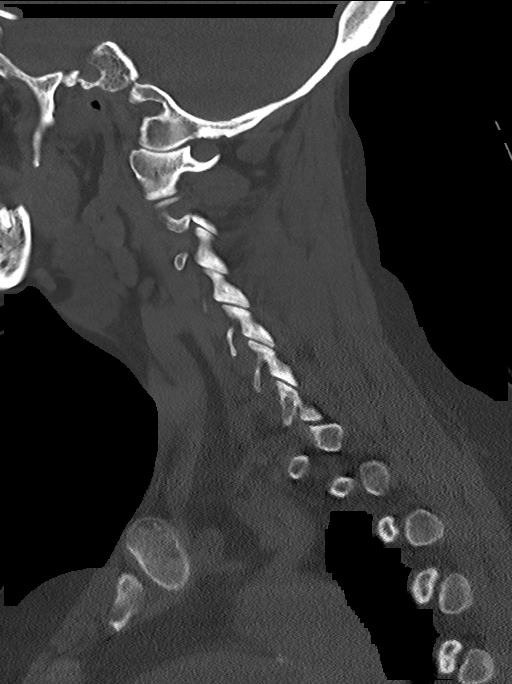
[im 49/61  bone]
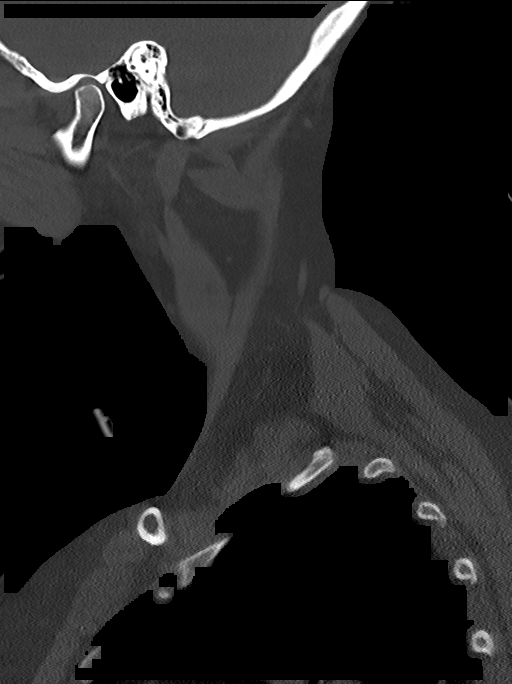

[Series 11: c_spine 2.0 cor bone · coronal · 0.33mm/px · 1 of 61 slices shown]
[im 31/61  bone]
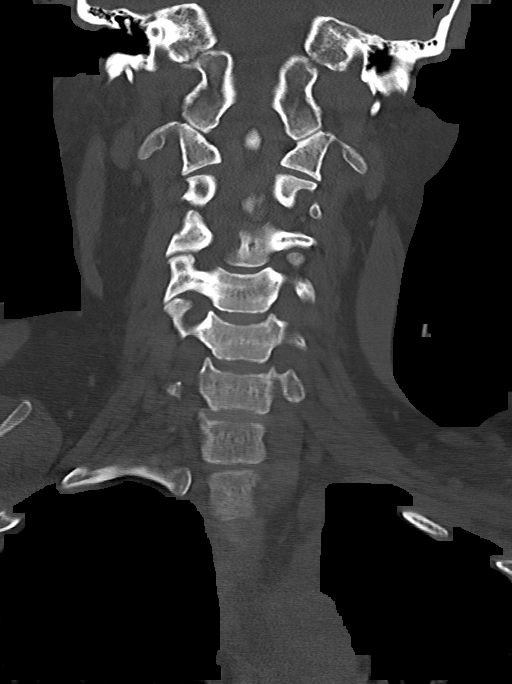

[Series 12: c_spine 2.0 orthogonals · axial · 0.21mm/px · z∈[-252,-161]mm · 3 of 109 slices shown]
[im 28/109  bone]
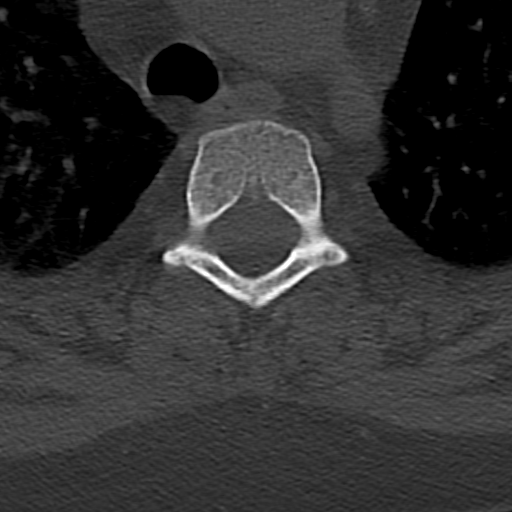
[im 55/109  bone]
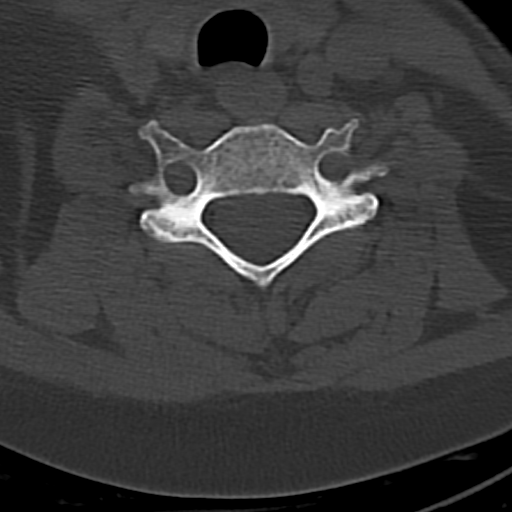
[im 82/109  bone]
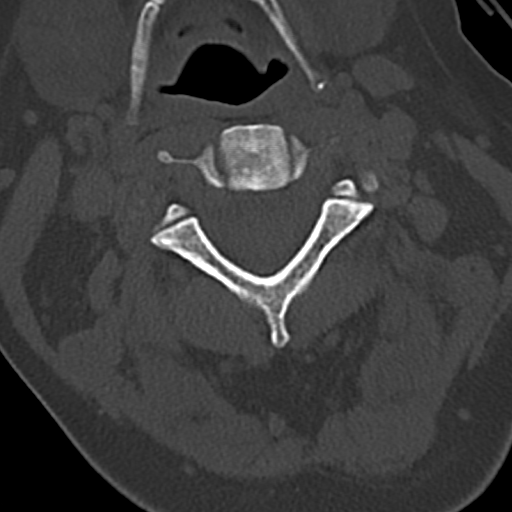

[11 of 33 positions shown; findings below may reference images not displayed]

FINDINGS: CT HEAD FINDINGS

Brain: There is suggestion of a tiny focus of acute subarachnoid
hemorrhage in the right posterior frontal convexity. This may
represent a tiny surface contusion

Vascular: No hyperdense vessel or unexpected calcification.

Skull: Calvarium appears intact. No acute depressed skull fractures.

Sinuses/Orbits: Paranasal sinuses and mastoid air cells are clear.

Other: None.

CT CERVICAL SPINE FINDINGS

Alignment: Normal.

Skull base and vertebrae: Skull base appears intact. No vertebral
compression deformities. No focal bone lesion or bone destruction.
Bone cortex appears intact.

Soft tissues and spinal canal: No prevertebral soft tissue swelling.
No abnormal paraspinal soft tissue mass or infiltration.

Disc levels:  Intervertebral disc space heights are preserved.

Upper chest: Motion artifact limits examination. Can exclude
infiltration or edema in the upper lungs. Ectatic ascending thoracic
aorta, measuring 3.5 cm diameter.

Other: None.
IMPRESSION: 1. Suggestion of tiny focus of acute subarachnoid hemorrhage in the
right posterior frontal convexity. This may represent a tiny surface
contusion.
2. Normal alignment of the cervical spine. No acute displaced
fractures identified.

These results were called by telephone at the time of interpretation
on [DATE] at [DATE] to Dr. GUNN , who verbally
acknowledged these results.

## 2018-10-20 MED ORDER — COLESTIPOL HCL 1 G PO TABS
6.0000 g | ORAL_TABLET | Freq: Two times a day (BID) | ORAL | Status: DC
  Administered 2018-10-20: 6 g via ORAL
  Filled 2018-10-20 (×3): qty 6

## 2018-10-20 MED ORDER — LABETALOL HCL 5 MG/ML IV SOLN: 5.0000 mg | INTRAVENOUS | Status: DC | PRN

## 2018-10-20 MED ORDER — SODIUM CHLORIDE 0.9 % IV BOLUS
1000.0000 mL | Freq: Once | INTRAVENOUS | Status: AC
  Administered 2018-10-20: 1000 mL via INTRAVENOUS

## 2018-10-20 MED ORDER — ONDANSETRON HCL 4 MG PO TABS: 4.0000 mg | ORAL_TABLET | Freq: Four times a day (QID) | ORAL | Status: DC | PRN

## 2018-10-20 MED ORDER — PANTOPRAZOLE SODIUM 40 MG PO TBEC
40.0000 mg | DELAYED_RELEASE_TABLET | Freq: Two times a day (BID) | ORAL | Status: DC
  Administered 2018-10-20 – 2018-10-21 (×2): 40 mg via ORAL
  Filled 2018-10-20 (×2): qty 1

## 2018-10-20 MED ORDER — HYDROMORPHONE HCL 2 MG PO TABS: 2.0000 mg | ORAL_TABLET | ORAL | Status: DC | PRN

## 2018-10-20 MED ORDER — DIAZEPAM 5 MG PO TABS
5.0000 mg | ORAL_TABLET | Freq: Once | ORAL | Status: AC
  Administered 2018-10-20: 5 mg via ORAL
  Filled 2018-10-20: qty 1

## 2018-10-20 MED ORDER — SODIUM CHLORIDE 0.9% FLUSH
3.0000 mL | Freq: Two times a day (BID) | INTRAVENOUS | Status: DC
  Administered 2018-10-20 (×2): 3 mL via INTRAVENOUS

## 2018-10-20 MED ORDER — HYDROMORPHONE HCL 2 MG PO TABS
4.0000 mg | ORAL_TABLET | ORAL | Status: DC | PRN
  Administered 2018-10-20 – 2018-10-21 (×6): 4 mg via ORAL
  Filled 2018-10-20 (×6): qty 2

## 2018-10-20 MED ORDER — SODIUM BICARBONATE 650 MG PO TABS
650.0000 mg | ORAL_TABLET | Freq: Three times a day (TID) | ORAL | Status: DC
  Administered 2018-10-20: 650 mg via ORAL
  Filled 2018-10-20: qty 1

## 2018-10-20 MED ORDER — MAGNESIUM OXIDE 400 (241.3 MG) MG PO TABS
800.0000 mg | ORAL_TABLET | Freq: Two times a day (BID) | ORAL | Status: DC
  Administered 2018-10-20 – 2018-10-21 (×3): 800 mg via ORAL
  Filled 2018-10-20 (×4): qty 2

## 2018-10-20 MED ORDER — POTASSIUM CHLORIDE CRYS ER 20 MEQ PO TBCR
40.0000 meq | EXTENDED_RELEASE_TABLET | Freq: Once | ORAL | Status: AC
  Administered 2018-10-20: 40 meq via ORAL
  Filled 2018-10-20: qty 2

## 2018-10-20 MED ORDER — LORAZEPAM 2 MG/ML IJ SOLN
1.0000 mg | Freq: Once | INTRAMUSCULAR | Status: AC
  Administered 2018-10-20: 1 mg via INTRAVENOUS
  Filled 2018-10-20: qty 1

## 2018-10-20 MED ORDER — ONDANSETRON HCL 4 MG/2ML IJ SOLN: 4.0000 mg | Freq: Four times a day (QID) | INTRAMUSCULAR | Status: DC | PRN

## 2018-10-20 MED ORDER — POTASSIUM CHLORIDE IN NACL 40-0.9 MEQ/L-% IV SOLN
INTRAVENOUS | Status: AC
  Administered 2018-10-20: 100 mL/h via INTRAVENOUS
  Filled 2018-10-20: qty 1000

## 2018-10-20 MED ORDER — INFLUENZA VAC SPLIT QUAD 0.5 ML IM SUSY
0.5000 mL | PREFILLED_SYRINGE | INTRAMUSCULAR | Status: AC
  Administered 2018-10-21: 0.5 mL via INTRAMUSCULAR
  Filled 2018-10-20: qty 0.5

## 2018-10-20 MED ORDER — CALCIUM GLUCONATE-NACL 1-0.675 GM/50ML-% IV SOLN
1.0000 g | Freq: Once | INTRAVENOUS | Status: AC
  Administered 2018-10-20: 1000 mg via INTRAVENOUS
  Filled 2018-10-20: qty 50

## 2018-10-20 MED ORDER — ACETAMINOPHEN 650 MG RE SUPP: 650.0000 mg | Freq: Four times a day (QID) | RECTAL | Status: DC | PRN

## 2018-10-20 MED ORDER — DULOXETINE HCL 30 MG PO CPEP
30.0000 mg | ORAL_CAPSULE | Freq: Two times a day (BID) | ORAL | Status: DC
  Administered 2018-10-20 – 2018-10-21 (×3): 30 mg via ORAL
  Filled 2018-10-20 (×3): qty 1

## 2018-10-20 MED ORDER — LEVETIRACETAM 500 MG PO TABS
500.0000 mg | ORAL_TABLET | Freq: Two times a day (BID) | ORAL | Status: DC
  Administered 2018-10-20 – 2018-10-21 (×4): 500 mg via ORAL
  Filled 2018-10-20 (×4): qty 1

## 2018-10-20 MED ORDER — MAGNESIUM SULFATE 2 GM/50ML IV SOLN: 2.0000 g | Freq: Once | INTRAVENOUS | Status: DC

## 2018-10-20 MED ORDER — HYDROCODONE-ACETAMINOPHEN 5-325 MG PO TABS
1.0000 | ORAL_TABLET | ORAL | Status: DC | PRN
  Administered 2018-10-20 (×2): 2 via ORAL
  Filled 2018-10-20 (×2): qty 2

## 2018-10-20 MED ORDER — POTASSIUM CHLORIDE CRYS ER 20 MEQ PO TBCR: 20.0000 meq | EXTENDED_RELEASE_TABLET | Freq: Two times a day (BID) | ORAL | Status: DC

## 2018-10-20 MED ORDER — POTASSIUM CHLORIDE 10 MEQ/100ML IV SOLN
10.0000 meq | INTRAVENOUS | Status: AC
  Administered 2018-10-20 (×2): 10 meq via INTRAVENOUS
  Filled 2018-10-20 (×2): qty 100

## 2018-10-20 MED ORDER — PANTOPRAZOLE SODIUM 40 MG PO TBEC
40.0000 mg | DELAYED_RELEASE_TABLET | Freq: Two times a day (BID) | ORAL | Status: DC
  Administered 2018-10-20: 40 mg via ORAL
  Filled 2018-10-20: qty 1

## 2018-10-20 MED ORDER — ACETAMINOPHEN 325 MG PO TABS: 650.0000 mg | ORAL_TABLET | Freq: Four times a day (QID) | ORAL | Status: DC | PRN

## 2018-10-20 MED ORDER — POTASSIUM CHLORIDE CRYS ER 20 MEQ PO TBCR
20.0000 meq | EXTENDED_RELEASE_TABLET | Freq: Two times a day (BID) | ORAL | Status: DC
  Administered 2018-10-21: 20 meq via ORAL
  Filled 2018-10-20: qty 1

## 2018-10-20 MED ORDER — POTASSIUM CHLORIDE 10 MEQ/100ML IV SOLN
10.0000 meq | INTRAVENOUS | Status: AC
  Administered 2018-10-20: 10 meq via INTRAVENOUS
  Filled 2018-10-20: qty 100

## 2018-10-20 MED ORDER — HYDROCODONE-ACETAMINOPHEN 5-325 MG PO TABS: 1.0000 | ORAL_TABLET | ORAL | Status: DC | PRN

## 2018-10-20 MED ORDER — POTASSIUM CHLORIDE CRYS ER 20 MEQ PO TBCR
40.0000 meq | EXTENDED_RELEASE_TABLET | ORAL | Status: AC
  Administered 2018-10-20 (×2): 40 meq via ORAL
  Filled 2018-10-20 (×2): qty 2

## 2018-10-20 MED ORDER — OXYCODONE HCL 5 MG PO TABS
5.0000 mg | ORAL_TABLET | ORAL | Status: DC | PRN
  Administered 2018-10-20 – 2018-10-21 (×4): 5 mg via ORAL
  Filled 2018-10-20 (×4): qty 1

## 2018-10-20 MED ORDER — SODIUM CHLORIDE 0.9 % IV SOLN: INTRAVENOUS | Status: DC

## 2018-10-20 MED ORDER — STERILE WATER FOR INJECTION IV SOLN
INTRAVENOUS | Status: DC
  Administered 2018-10-20: 17:00:00 via INTRAVENOUS
  Filled 2018-10-20 (×3): qty 850
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
## 2018-10-20 NOTE — Plan of Care (Signed)
Progressing toward goals. 
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
## 2018-10-20 NOTE — Progress Notes (Signed)
Pt c/o severe h/a  anxiety and generalized bodily aches.   Medicated for   pain and anxiety for pain and anxiety but pt continue to c/o that nothing is helping to  Relief the pain..  cold pack offered  all needed help given. RN will continue to monitor.

## 2018-10-20 NOTE — Progress Notes (Signed)
Patient was seen and examined, reviewed admission note HPI chart, labs and imaging.  Briefly,  38 year old female with past medical history of Crohn's disease, hypertension, anxiety presented after transient loss of consciousness consciousness at home with a fall and generalized shaking and tongue bite, witnessed by patient's 29-year-old son who seem to think she had some jerking movements possibly consistent with a seizure.  Patient hit her head when she fell and having moderate headache but no nausea vomiting visual change.  Small SAH, traumatic, seen by neurosurgery. no further needed and will resolve on its own. She does not need a follow-up CT head unless she has a neurologic decline.  As per discussion with the neurologist patient needs to be on Keppra for a week for seizure prophylaxis.Cont  neurocheck.  Complaints of chronic pain along with headache, cont on Dilaudid/oxycodone.  First seizure: Transient LOC without prodrome, CT head without any acute issues.  Possibly from electrolyte derangements.  Neurology was discussed with repeat CT head and recommended 7 days of Keppra 500 twice daily for prophylaxis in light of her bleed, recommend to stop Cymbalta and is being tapered. F/u outpatient.  Electrolyte imbalance.  Severe hypokalemia/hypocalcemia/Hypomagnesium: replaced with potassium and calcium supplementation. Repeat labs.  Leukocytosis somewhat chronic in nature.  Likely slight worsening from #1  Metabolic acidosis with non-anion gap, bicarbonate 14.  I will start her on oral bicarb supplementation.  Suspect this is related to her Crohn's disease/diarrhea  Mild AKI continue IV fluid hydration.  Crohn's disease with diarrhea.  Continue her home regimen.  She seems to be noncompliant, not taking colestipol.  Monitor electrolytes, obtain PT OT eval, monitor overnight.
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
## 2018-10-21 DIAGNOSIS — I609 Nontraumatic subarachnoid hemorrhage, unspecified: Secondary | ICD-10-CM | POA: Diagnosis not present

## 2018-10-21 LAB — CBC WITH DIFFERENTIAL/PLATELET
Abs Immature Granulocytes: 0.51 10*3/uL — ABNORMAL HIGH (ref 0.00–0.07)
Basophils Absolute: 0.2 10*3/uL — ABNORMAL HIGH (ref 0.0–0.1)
Basophils Relative: 1 %
Eosinophils Absolute: 0.2 10*3/uL (ref 0.0–0.5)
Eosinophils Relative: 1 %
HCT: 38 % (ref 36.0–46.0)
Hemoglobin: 12.2 g/dL (ref 12.0–15.0)
Immature Granulocytes: 3 %
Lymphocytes Relative: 23 %
Lymphs Abs: 3.5 10*3/uL (ref 0.7–4.0)
MCH: 28.6 pg (ref 26.0–34.0)
MCHC: 32.1 g/dL (ref 30.0–36.0)
MCV: 89 fL (ref 80.0–100.0)
Monocytes Absolute: 0.7 10*3/uL (ref 0.1–1.0)
Monocytes Relative: 5 %
Neutro Abs: 10.1 10*3/uL — ABNORMAL HIGH (ref 1.7–7.7)
Neutrophils Relative %: 67 %
Platelets: 451 10*3/uL — ABNORMAL HIGH (ref 150–400)
RBC: 4.27 MIL/uL (ref 3.87–5.11)
RDW: 18.9 % — ABNORMAL HIGH (ref 11.5–15.5)
WBC: 15.2 10*3/uL — ABNORMAL HIGH (ref 4.0–10.5)
nRBC: 0 % (ref 0.0–0.2)

## 2018-10-21 LAB — BASIC METABOLIC PANEL
Anion gap: 10 (ref 5–15)
BUN: 12 mg/dL (ref 6–20)
CO2: 21 mmol/L — ABNORMAL LOW (ref 22–32)
Calcium: 7.2 mg/dL — ABNORMAL LOW (ref 8.9–10.3)
Chloride: 109 mmol/L (ref 98–111)
Creatinine, Ser: 1.06 mg/dL — ABNORMAL HIGH (ref 0.44–1.00)
GFR calc Af Amer: >60 mL/min (ref >60)
GFR calc non Af Amer: >60 mL/min (ref >60)
Glucose, Bld: 105 mg/dL — ABNORMAL HIGH (ref 70–99)
Potassium: 3.4 mmol/L — ABNORMAL LOW (ref 3.5–5.1)
Sodium: 140 mmol/L (ref 135–145)

## 2018-10-21 LAB — MAGNESIUM
Magnesium: 0.6 mg/dL — CL (ref 1.7–2.4)
Magnesium: 2.4 mg/dL (ref 1.7–2.4)

## 2018-10-21 LAB — GLUCOSE, CAPILLARY: Glucose-Capillary: 95 mg/dL (ref 70–99)

## 2018-10-21 MED ORDER — MAGNESIUM SULFATE 4 GM/100ML IV SOLN
4.0000 g | Freq: Once | INTRAVENOUS | Status: AC
  Administered 2018-10-21: 4 g via INTRAVENOUS
  Filled 2018-10-21: qty 100

## 2018-10-21 MED ORDER — LEVETIRACETAM 500 MG PO TABS: 500.0000 mg | ORAL_TABLET | Freq: Two times a day (BID) | ORAL | 0 refills | Status: DC

## 2018-10-21 MED ORDER — DULOXETINE HCL 30 MG PO CPEP: ORAL_CAPSULE | ORAL | 0 refills | Status: DC

## 2018-10-21 NOTE — Plan of Care (Signed)
Progressing towards goals

## 2018-10-21 NOTE — Discharge Summary (Signed)
Physician Discharge Summary  [name] RCV:[id number] DOB: [birth year] DOA: 10/19/2018  PCP: Patient, [name]  Admit date: 10/19/2018 Discharge date: 10/21/2018  Admitted From: home Disposition:  home  Recommendations for Outpatient Follow-up:  1. Follow up with PCP in 1-2 weeks 2. Please obtain BMP/CBC in one week 3. Please follow up on the following pending results:  Home Health: PT  Equipment/Devices: none  Discharge Condition:stable  CODE STATUS:full code  Diet recommendation:  regular  Hosp Summary: 38 year old female with past medical history of Crohn's disease, hypertension, anxiety presented after transient loss of consciousness consciousness at home with a fall and generalized shaking and tongue bite, witnessed by patient's 6-year-old son who seem to think she had some jerking movements possibly consistent with a seizure.  Patient hit her head when she fell and having moderate headache but no nausea vomiting visual change. Patient was admitted, seen by neurosurgery felt no neurosurgical interventions needed and no follow-up CT head needed.  Patient was monitored in the hospital overnight she has no other new complaints. Electrolytes were replaced and improved.  Chronic leukocytosis which is already downtrending. At this time no active issues for inpatient stay and is being discharged home in medically stable condition.  Issue addressed  Small SAH, traumatic, seen by neurosurgery. no further needed and will resolve on its own. She does not need a follow-up CT head unless she has a neurologic decline.  As per discussion with the neurologist patient needs to be on Keppra for a week for seizure prophylaxis  First seizure: Transient LOC without prodrome, CT head without any acute issues.  Possibly from electrolyte derangements.  Neurology was discussed with repeat CT head and recommended 7 days of Keppra 500 twice daily for prophylaxis in light of her bleed, recommend to  stop Cymbalta and is being tapered and advised to wean and stop, and see pcp.  Provided follow-up in [facility] neurology associate, patient is to call the office when it opens tomorrow.Instructed not to drive for a week.  Electrolyte imbalance.  Severe hypokalemia/hypocalcemia/Hypomagnesium: Patient is on oral supplements at home unclear if she was taking.  Likely worsened from diarrhea.  Stem potassium is improved to 3.4, bicarbonate 21 creatinine 1.0, calcium improved to 7.2 and corrected calcium is stable at 8,continue on calcium supplementation, potassium supplementation. Mag was low and repleted and at >2. F/u bmp a pcp next week  Leukocytosis somewhat chronic in nature.  Likely slight worsening from #1. repeat  CBC next week with PCP.  She is afebrile no other signs of infection. Recent Labs  Lab 10/19/18 2343 10/20/18 0433 10/21/18 0452  WBC 21.6* 10.2* 58.5*   Metabolic acidosis with non-anion gap, bicarbonate 14. IMPROVED W IV BICARB.She has chroninc low bicarb.Suspect this is related to her Crohn's disease/diarrhea  Mild AKI -creat stable  Crohn's disease with diarrhea.  Continue her home regimen.  She seems to be noncompliant, not taking colestipol here.  Pain issues, per nursing staff patient seems to be manipulative, requesting pain medication. F/u w PCP  Discharge Diagnoses:  Principal Problem:   Subarachnoid hemorrhage ([place]) Active Problems:   Hypokalemia   Hypocalcemia   Hypomagnesemia   Mild renal insufficiency   Leukocytosis   Crohn's disease with complication ([place])   Thrombocytosis ([place])   First time seizure [facility])    Discharge Instructions  Discharge Instructions    Ambulatory referral to Neurology   Complete by:  As directed    An appointment is requested in approximately: 2 weeks  Diet - low sodium heart healthy   Complete by:  As directed    Discharge instructions   Complete by:  As directed    No driving for 1 week.  Follow-up with the  neurology clinic for continued recommendation.  Please call call MD or return to ER for similar recurring problem, nausea/vomiting, uncontrolled pain, abdominal pain chest pain, shortness of breath, fever. Please follow-up your doctor as instructed in a week time and call the office for appointment. Please avoid alcohol, smoking, or any other illicit substance.   Increase activity slowly   Complete by:  As directed      Allergies as of 10/21/2018      Reactions   Penicillins Hives   Has patient had a PCN reaction causing immediate rash, facial/tongue/throat swelling, SOB or lightheadedness with hypotension: Yes Has patient had a PCN reaction causing severe rash involving mucus membranes or skin necrosis: Unk Has patient had a PCN reaction that required hospitalization: Yes Has patient had a PCN reaction occurring within the last 10 years: No If all of the above answers are "NO", then may proceed with Cephalosporin use.   Doxycycline Other (See Comments)   Reaction not recalled      Medication List    TAKE these medications   acetaminophen 325 MG tablet Commonly known as:  TYLENOL Take 2 tablets (650 mg total) by mouth every 6 (six) hours as needed for mild pain (or Fever >/= 101).   budesonide 3 MG 24 hr capsule Commonly known as:  ENTOCORT EC Take 3 capsules (9 mg total) by mouth daily.   colestipol 1 g tablet Commonly known as:  COLESTID Take 6 g by mouth 2 (two) times daily.   cyanocobalamin 1000 MCG/ML injection Commonly known as:  (VITAMIN B-12) Inject 1,000 mcg into the skin every 30 (thirty) days.   DULoxetine 30 MG capsule Commonly known as:  CYMBALTA Cont 30 mg bid x7 days, then 30 mg daily for 7days then 30 mg every other days for 7 days then stop What changed:    how much to take  how to take this  when to take this  additional instructions   levETIRAcetam 500 MG tablet Commonly known as:  KEPPRA Take 1 tablet (500 mg total) by mouth 2 (two) times  daily for 6 days.   magnesium oxide 400 (241.3 Mg) MG tablet Commonly known as:  MAG-OX Take 2 tablets (800 mg total) by mouth 2 (two) times daily.   metoprolol succinate 50 MG 24 hr tablet Commonly known as:  TOPROL-XL Take 1 tablet (50 mg total) by mouth daily. Take with or immediately following a meal.   ondansetron 4 MG tablet Commonly known as:  ZOFRAN Take 4 mg by mouth every 8 (eight) hours as needed for nausea.   pantoprazole 40 MG tablet Commonly known as:  PROTONIX Take 1 tablet (40 mg total) by mouth daily. What changed:  when to take this   potassium chloride SA 20 MEQ tablet Commonly known as:  K-DUR,KLOR-CON Take 1 tablet (20 mEq total) by mouth 2 (two) times daily. What changed:  when to take this   ustekinumab 90 MG/ML Sosy injection Commonly known as:  STELARA Inject 1 mL into the skin every 8 (eight) weeks.   Vitamin D (Ergocalciferol) 1.25 MG (50000 UT) Caps capsule Commonly known as:  DRISDOL Take 50,000 Units by mouth every Monday.      Follow-up Information    Schedule an appointment as soon as possible  for a visit  with [place].   Contact information: [address]     [address] [place] [address] [place]. Call.   Contact information: [place] [phone] [place], [facility] Follow up.   Specialty:  Home Health Services         Allergies  Allergen Reactions  . Penicillins Hives    Has patient had a PCN reaction causing immediate rash, facial/tongue/throat swelling, SOB or lightheadedness with hypotension: Yes Has patient had a PCN reaction causing severe rash involving mucus membranes or skin necrosis: Unk Has patient had a PCN reaction that required hospitalization: Yes Has patient had a PCN reaction occurring within the last 10 years: No If all of the above answers  are "NO", then may proceed with Cephalosporin use.     Marland Kitchen Doxycycline Other (See Comments)    Reaction not recalled    Consultations: Neuro surgery  Neurology  Procedures/Studies: Ct Head Wo Contrast  Result Date: 10/20/2018 CLINICAL DATA:  Syncopal episode. Headache. EXAM: CT HEAD WITHOUT CONTRAST CT CERVICAL SPINE WITHOUT CONTRAST TECHNIQUE: Multidetector CT imaging of the head and cervical spine was performed following the standard protocol without intravenous contrast. Multiplanar CT image reconstructions of the cervical spine were also generated. COMPARISON:  CT head 12/17/2009 FINDINGS: CT HEAD FINDINGS Brain: There is suggestion of a tiny focus of acute subarachnoid hemorrhage in the right posterior frontal convexity. This may represent a tiny surface contusion Vascular: No hyperdense vessel or unexpected calcification. Skull: Calvarium appears intact. No acute depressed skull fractures. Sinuses/Orbits: Paranasal sinuses and mastoid air cells are clear. Other: None. CT CERVICAL SPINE FINDINGS Alignment: Normal. Skull base and vertebrae: Skull base appears intact. No vertebral compression deformities. No focal bone lesion or bone destruction. Bone cortex appears intact. Soft tissues and spinal canal: No prevertebral soft tissue swelling. No abnormal paraspinal soft tissue mass or infiltration. Disc levels:  Intervertebral disc space heights are preserved. Upper chest: Motion artifact limits examination. Can exclude infiltration or edema in the upper lungs. Ectatic ascending thoracic aorta, measuring 3.5 cm diameter. Other: None. IMPRESSION: 1. Suggestion of tiny focus of acute subarachnoid hemorrhage in the right posterior frontal convexity. This may represent a tiny surface contusion. 2. Normal alignment of the cervical spine. No acute displaced fractures identified. These results were called by telephone at the time of interpretation on 10/20/2018 at 12:48 am to Dr. Theotis Burrow , who verbally  acknowledged these results. Electronically Signed   By: Lucienne Capers M.D.   On: 10/20/2018 00:51   Ct Cervical Spine Wo Contrast  Result Date: 10/20/2018 CLINICAL DATA:  Syncopal episode. Headache. EXAM: CT HEAD WITHOUT CONTRAST CT CERVICAL SPINE WITHOUT CONTRAST TECHNIQUE: Multidetector CT imaging of the head and cervical spine was performed following the standard protocol without intravenous contrast. Multiplanar CT image reconstructions of the cervical spine were also generated. COMPARISON:  CT head 12/17/2009 FINDINGS: CT HEAD FINDINGS Brain: There is suggestion of a tiny focus of acute subarachnoid hemorrhage in the right posterior frontal convexity. This may represent a tiny surface contusion Vascular: No hyperdense vessel or unexpected calcification. Skull: Calvarium appears intact. No acute depressed skull fractures. Sinuses/Orbits: Paranasal sinuses and mastoid air cells are clear. Other: None. CT CERVICAL SPINE FINDINGS Alignment: Normal. Skull base and vertebrae: Skull base appears intact. No vertebral compression deformities. No focal bone lesion or bone  destruction. Bone cortex appears intact. Soft tissues and spinal canal: No prevertebral soft tissue swelling. No abnormal paraspinal soft tissue mass or infiltration. Disc levels:  Intervertebral disc space heights are preserved. Upper chest: Motion artifact limits examination. Can exclude infiltration or edema in the upper lungs. Ectatic ascending thoracic aorta, measuring 3.5 cm diameter. Other: None. IMPRESSION: 1. Suggestion of tiny focus of acute subarachnoid hemorrhage in the right posterior frontal convexity. This may represent a tiny surface contusion. 2. Normal alignment of the cervical spine. No acute displaced fractures identified. These results were called by telephone at the time of interpretation on 10/20/2018 at 12:48 am to Dr. Theotis Burrow , who verbally acknowledged these results. Electronically Signed   By: Lucienne Capers M.D.    On: 10/20/2018 00:51    Subjective: Resting this morning well.  No acute disease.  Ambulate with physical therapy.  Tolerating diet.  Discharge Exam: Vitals:   10/21/18 0359 10/21/18 1316  BP: (!) 145/103 (!) 148/106  Pulse: 83 94  Resp: 18 18  Temp: 98 F (36.7 C) 98.2 F (36.8 C)  SpO2: 96%    Vitals:   10/20/18 2315 10/21/18 0359 10/21/18 0700 10/21/18 1316  BP: (!) 140/97 (!) 145/103  (!) 148/106  Pulse: 80 83  94  Resp: 18 18  18   Temp: 98.1 F (36.7 C) 98 F (36.7 C)  98.2 F (36.8 C)  TempSrc: Oral Oral  Oral  SpO2:  96%    Weight:   63.5 kg   Height:   5' 2"  (1.575 m)     General: Pt is alert, awake, not in acute distress Cardiovascular: RRR, S1/S2 +, no rubs, no gallops Respiratory: CTA bilaterally, no wheezing, no rhonchi Abdominal: Soft, NT, ND, bowel sounds + Extremities: no edema, no cyanosis   The results of significant diagnostics from this hospitalization (including imaging, microbiology, ancillary and laboratory) are listed below for reference.     Microbiology: No results found for this or any previous visit (from the past 240 hour(s)).   Labs: BNP (last 3 results) Recent Labs    12/08/17 0748  BNP 390.3*   Basic Metabolic Panel: Recent Labs  Lab 10/19/18 2343 10/20/18 0433 10/20/18 1354 10/21/18 0452 10/21/18 1251  NA 137 140 137 140  --   K 2.9* 2.8* 3.7 3.4*  --   CL 117* 116* 117* 109  --   CO2 13* 14* 13* 21*  --   GLUCOSE 92 118* 101* 105*  --   BUN 17 15 11 12   --   CREATININE 1.18* 1.17* 1.10* 1.06*  --   CALCIUM 6.8* 6.4* 6.5* 7.2*  --   MG 0.7* 0.7*  --  0.6* 2.4   Liver Function Tests: Recent Labs  Lab 10/19/18 2343 10/20/18 0433  AST 20 20  ALT 13 12  ALKPHOS 71 72  BILITOT 0.9 0.5  PROT 6.3* 5.4*  ALBUMIN 3.5 3.0*   No results for input(s): LIPASE, AMYLASE in the last 168 hours. No results for input(s): AMMONIA in the last 168 hours. CBC: Recent Labs  Lab 10/19/18 2343 10/20/18 0433 10/21/18 0452   WBC 21.6* 19.5* 15.2*  NEUTROABS 17.3* 13.8* 10.1*  HGB 13.6 12.1 12.2  HCT 40.7 37.9 38.0  MCV 89.1 90.0 89.0  PLT 485* 441* 451*   Cardiac Enzymes: No results for input(s): CKTOTAL, CKMB, CKMBINDEX, TROPONINI in the last 168 hours. BNP: Invalid input(s): POCBNP CBG: Recent Labs  Lab 10/21/18 0801  GLUCAP 95   D-Dimer  No results for input(s): DDIMER in the last 72 hours. Hgb A1c No results for input(s): HGBA1C in the last 72 hours. Lipid Profile No results for input(s): CHOL, HDL, LDLCALC, TRIG, CHOLHDL, LDLDIRECT in the last 72 hours. Thyroid function studies No results for input(s): TSH, T4TOTAL, T3FREE, THYROIDAB in the last 72 hours.  Invalid input(s): FREET3 Anemia work up No results for input(s): VITAMINB12, FOLATE, FERRITIN, TIBC, IRON, RETICCTPCT in the last 72 hours. Urinalysis    Component Value Date/Time   COLORURINE STRAW (A) 10/20/2018 1238   APPEARANCEUR CLEAR 10/20/2018 1238   LABSPEC 1.009 10/20/2018 1238   PHURINE 6.0 10/20/2018 1238   GLUCOSEU NEGATIVE 10/20/2018 1238   HGBUR NEGATIVE 10/20/2018 [place] 10/20/2018 1238   KETONESUR NEGATIVE 10/20/2018 1238   PROTEINUR NEGATIVE 10/20/2018 1238   UROBILINOGEN 0.2 10/22/2010 0038   NITRITE NEGATIVE 10/20/2018 1238   LEUKOCYTESUR NEGATIVE 10/20/2018 1238   Sepsis Labs Invalid input(s): PROCALCITONIN,  WBC,  LACTICIDVEN Microbiology No results found for this or any previous visit (from the past 240 hour(s)).   Time coordinating discharge: 25 minutes  SIGNED:   Antonieta Pert, MD  [facility] 10/21/2018, 2:43 PM  If 7PM-7AM, please contact night-coverage [email or web address]

## 2018-10-21 NOTE — Progress Notes (Signed)
Dr. Maren Beach, made aware magnesium 2.4 and he is ok with patient to be discharged with over the counter pain medications.

## 2018-10-21 NOTE — Evaluation (Addendum)
Physical Therapy Evaluation Patient Details Name: [name] MRN: [id number] DOB: Jul 06, 1981 Today's Date: 10/21/2018   History of Present Illness  Patient is a 37 y/o female who presents with witnessed syncopal episode vs seizure with tongue biting and possible shaking. Head CT-tiny SAH in right posterior frontal convexity. PMH includes Crohn's disease, HTN, otitis media, anxiety, migraines, fibromyalgia, tobacco abuse, IV drug use.   Clinical Impression  Patient presents with pain in head, abdomen (from Crohn's) and back, impaired memory, decreased balance, slow processing and impaired mobility s/p above. Pt independent PTA and cares for 29 y/o son. Has a supportive mother. Pt tolerated transfers and gait training with Min guard assist for balance/safety. Distracted by pain. Not able to recall 0/3 words within session. Recommend higher level cognitive assessment. Pt reports at baseline she has memory issues from prior hospitalization last year. Recommend OT consult. Order placed. Will follow acutely to maximize independence and mobility prior to return home.     Follow Up Recommendations Supervision - Intermittent;Home health PT(pending improvement and cognition)    Equipment Recommendations  None recommended by PT    Recommendations for Other Services       Precautions / Restrictions Precautions Precautions: Fall Restrictions Weight Bearing Restrictions: No      Mobility  Bed Mobility Overal bed mobility: Needs Assistance Bed Mobility: Rolling;Sidelying to Sit Rolling: Min guard Sidelying to sit: Min assist       General bed mobility comments: Cues for log roll technique due to back pain; Min A for trunk. No dizziness.   Transfers Overall transfer level: Needs assistance Equipment used: None Transfers: Sit to/from Stand Sit to Stand: Min guard         General transfer comment: Min guard for safety. Stood from [employer].   Ambulation/Gait Ambulation/Gait  assistance: Min guard Gait Distance (Feet): 150 Feet Assistive device: None Gait Pattern/deviations: Step-through pattern;Decreased stride length;Drifts right/left Gait velocity: decreased   General Gait Details: Slow, mildly unsteady gait but no overt LOB. HR up to 110 bpm.   Stairs            Wheelchair Mobility    Modified Rankin (Stroke Patients Only) Modified Rankin (Stroke Patients Only) Pre-Morbid Rankin Score: Slight disability Modified Rankin: Moderately severe disability     Balance Overall balance assessment: Needs assistance Sitting-balance support: Feet supported;No upper extremity supported Sitting balance-Leahy Scale: Good     Standing balance support: During functional activity Standing balance-Leahy Scale: Fair                               Pertinent Vitals/Pain Pain Assessment: Faces Faces Pain Scale: Hurts whole lot Pain Location: right abdomen; head; generalized everywhere per report Pain Descriptors / Indicators: Discomfort;Aching;Grimacing;Sharp Pain Intervention(s): Monitored during session;Repositioned;Premedicated before session;Limited activity within patient's tolerance    Home Living Family/patient expects to be discharged to:: Private residence Living Arrangements: Children;Other relatives(mother) Available Help at Discharge: Family Type of Home: House Home Access: Stairs to enter Entrance Stairs-Rails: None Entrance Stairs-Number of Steps: 2 Home Layout: One level Home Equipment: None Additional Comments: Mother supportive and in good health; available 24/7. Currently taking care of pt's son    Prior Function Level of Independence: Independent         Comments: Drives, does not work, has 50 y.o. son     Hand Dominance        Extremity/Trunk Assessment   Upper Extremity Assessment Upper Extremity Assessment: Defer  to OT evaluation    Lower Extremity Assessment Lower Extremity Assessment: Generalized  weakness(Sensation WFLs.)    Cervical / Trunk Assessment Cervical / Trunk Assessment: Normal  Communication   Communication: Expressive difficulties  Cognition Arousal/Alertness: Awake/alert Behavior During Therapy: WFL for tasks assessed/performed Overall Cognitive Status: No family/caregiver present to determine baseline cognitive functioning Area of Impairment: Memory;Problem solving;Following commands                     Memory: Decreased short-term memory Following Commands: Follows one step commands with increased time;Follows multi-step commands inconsistently     Problem Solving: Slow processing;Requires verbal cues;Difficulty sequencing General Comments: Pt amnesic to fall at home. Does not recall why she is here. Slow to respond to questions. Reports her memory has not been great since admission last year when she was intubated. But does not feel baseline. Distracted by pain. Not able to recall 0/3 words within session.      General Comments      Exercises     Assessment/Plan    PT Assessment Patient needs continued PT services  PT Problem List Decreased strength;Decreased balance;Decreased cognition;Pain;Decreased mobility;Decreased activity tolerance       PT Treatment Interventions Functional mobility training;Balance training;Patient/family education;Gait training;Therapeutic activities;Therapeutic exercise;Stair training;Neuromuscular re-education    PT Goals (Current goals can be found in the Care Plan section)  Acute Rehab PT Goals Patient Stated Goal: to get better and make this pain go away PT Goal Formulation: With patient Time For Goal Achievement: 11/04/18 Potential to Achieve Goals: Good    Frequency Min 3X/week   Barriers to discharge        Co-evaluation               AM-PAC PT "6 Clicks" Mobility  Outcome Measure Help needed turning from your back to your side while in a flat bed without using bedrails?: None Help needed  moving from lying on your back to sitting on the side of a flat bed without using bedrails?: A Little Help needed moving to and from a bed to a chair (including a wheelchair)?: None Help needed standing up from a chair using your arms (e.g., wheelchair or bedside chair)?: A Little Help needed to walk in hospital room?: A Little Help needed climbing 3-5 steps with a railing? : A Little 6 Click Score: 20    End of Session Equipment Utilized During Treatment: Gait belt Activity Tolerance: Patient limited by pain Patient left: in bed;with call bell/phone within reach;with bed alarm set(sitting EOB) Nurse Communication: Mobility status PT Visit Diagnosis: Unsteadiness on feet (R26.81);Difficulty in walking, not elsewhere classified (R26.2);Muscle weakness (generalized) (M62.81);Pain Pain - part of body: (abdomen, back, head)    Time: 3716-9678 PT Time Calculation (min) (ACUTE ONLY): 15 min   Charges:   PT Evaluation $PT Eval Moderate Complexity: 1 Mod          Wray Kearns, PT, DPT Acute Rehabilitation Services Pager [phone] Office [phone]      [place] 10/21/2018, 8:51 AM

## 2018-10-21 NOTE — Progress Notes (Signed)
Patient discharged. IV removed with the catheter intact. Patient transported by mother. Discharge instructions and prescription information given to the patient who verbalized understanding.

## 2018-10-21 NOTE — TOC Initial Note (Addendum)
Transition of Care [facility]) - Initial/Assessment Note    Patient Details  Name: [name] MRN: [id number] Date of Birth: 01-11-1981  Transition of Care [facility]) CM/SW Contact:    Carles Collet, RN Phone Number: 10/21/2018, 10:20 AM  Clinical Narrative:                 Patient aroused for assessment and disengaged in conversation falling back asleep while CM speaking. Spoke w patient's mother who confirms patient will be returning to address on file with her at DC. Referral made to [facility] for potential HH, they will assess if Medicaid will cover services for her admitted Dx and notify me. Mother to provide transportation home. PCP: Ronal Fear Primary Care Provider: [place]  Address: [place], [place] [address]  Contact: [place]  Telephone: [phone]    [facility] accepting for [facility] PT  Expected Discharge Plan: [place] Barriers to Discharge: No Barriers Identified   Patient Goals and CMS Choice Patient states their goals for this hospitalization and ongoing recovery are:: to get better and make this pain go away      Expected Discharge Plan and Services Expected Discharge Plan: [place] Discharge Planning Services: CM Consult   Living arrangements for the past 2 months: Single Family Home Expected Discharge Date: 10/21/18                        Prior Living Arrangements/Services Living arrangements for the past 2 months: Single Family Home Lives with:: Parents                   Activities of Daily Living Home Assistive Devices/Equipment: None ADL Screening (condition at time of admission) Patient's cognitive ability adequate to safely complete daily activities?: Yes Is the patient deaf or have difficulty hearing?: No Does the patient have difficulty seeing, even when wearing glasses/contacts?: No Does the patient have difficulty concentrating, remembering, or  making decisions?: No Patient able to express need for assistance with ADLs?: Yes Does the patient have difficulty dressing or bathing?: No Independently performs ADLs?: Yes (appropriate for developmental age) Does the patient have difficulty walking or climbing stairs?: No Weakness of Legs: Both Weakness of Arms/Hands: None  Permission Sought/Granted                  Emotional Assessment Appearance:: Appears older than stated age Attitude/Demeanor/Rapport: Lethargic Affect (typically observed): Withdrawn Orientation: : Oriented to Self, Oriented to Place, Oriented to  Time, Oriented to Situation      Admission diagnosis:  Witnessed seizure-like activity ([place]) [R56.9] Patient Active Problem List   Diagnosis Date Noted  . Subarachnoid hemorrhage ([place]) 10/20/2018  . First time seizure ([place]) 10/20/2018  . Microcytic anemia 01/23/2018  . Leukocytosis   . Tobacco abuse   . Alcohol abuse   . Polysubstance abuse ([place])   . Attention deficit hyperactivity disorder (ADHD)   . Vitamin B12 deficiency   . Fibromyalgia   . Crohn's disease with complication ([place])   . Uncomplicated asthma   . Thrombocytosis ([place])   . Mild renal insufficiency   . Hypocalcemia 12/06/2017  . Hypomagnesemia 12/06/2017  . 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 Abdominal pain 10/09/2013  . Rectal prolapse 10/09/2013  . Chronic narcotic dependence ([place]) 11/02/2011  . Abdominal pain, generalized 11/02/2011  . Hypokalemia 11/02/2011  . Radiculopathy 11/02/2011  .  Bilateral shoulder pain 10/01/2011  . Cervical radiculopathy 09/30/2011  . Pain, joint, multiple sites 09/30/2011  . Lumbar radicular pain 08/04/2011  . Chronic neck pain 08/04/2011  . Endometriosis 08/04/2011  . Allergic rhinitis, cause unspecified 01/21/2011  . Chronic back pain 01/21/2011  . B12 deficiency 12/13/2010  . ECZEMA 05/20/2010  . COMMON MIGRAINE 02/05/2010  . ADHD 12/02/2009  . ACNE  ROSACEA 12/02/2009  . CROHN'S [facility] INTESTINE 05/19/2009  . Anxiety state 03/24/2009  . Essential hypertension 03/24/2009  . GERD 03/24/2009  . HEADACHE, CHRONIC 03/24/2009   PCP:  Patient, [name] Pharmacy:   [id number] - [place], [place] - [address] [place] [address] Phone: [phone] Fax: [phone]     Social Determinants of Health (SDOH) Interventions    Readmission Risk Interventions  No flowsheet data found.

## 2018-10-21 NOTE — Plan of Care (Signed)
  Problem: Coping: Goal: Level of anxiety will decrease Outcome: Not Progressing Note:   Pt very anxious ,medicated with ativan 1 mg and pain .    Problem: Clinical Measurements: Goal: Ability to maintain clinical measurements within normal limits will improve Outcome: Not Progressing   Problem: Clinical Measurements: Goal: Ability to maintain clinical measurements within normal limits will improve Outcome: Not Progressing   Problem: Coping: Goal: Level of anxiety will decrease Outcome: Not Progressing Note:   Pt very anxious ,medicated with ativan 1 mg and pain .    Problem: Nutrition: Goal: Adequate nutrition will be maintained Outcome: Progressing

## 2018-10-22 DIAGNOSIS — F329 Major depressive disorder, single episode, unspecified: Principal | ICD-10-CM

## 2018-10-22 DIAGNOSIS — F419 Anxiety disorder, unspecified: Principal | ICD-10-CM

## 2018-10-23 ENCOUNTER — Emergency Department (HOSPITAL_COMMUNITY): Payer: Medicaid Other

## 2018-10-23 ENCOUNTER — Emergency Department (HOSPITAL_COMMUNITY)
Admission: EM | Admit: 2018-10-23 | Discharge: 2018-10-23 | Disposition: A | Discharge status: Home / Self Care | Payer: Medicaid Other | Attending: Emergency Medicine | Admitting: Emergency Medicine

## 2018-10-23 ENCOUNTER — Other Ambulatory Visit: Payer: Self-pay

## 2018-10-23 ENCOUNTER — Encounter (HOSPITAL_COMMUNITY): Payer: Self-pay | Admitting: Emergency Medicine

## 2018-10-23 DIAGNOSIS — F17210 Nicotine dependence, cigarettes, uncomplicated: Secondary | ICD-10-CM | POA: Insufficient documentation

## 2018-10-23 DIAGNOSIS — R51 Headache: Secondary | ICD-10-CM | POA: Diagnosis present

## 2018-10-23 DIAGNOSIS — I10 Essential (primary) hypertension: Secondary | ICD-10-CM | POA: Insufficient documentation

## 2018-10-23 DIAGNOSIS — Z79899 Other long term (current) drug therapy: Secondary | ICD-10-CM | POA: Insufficient documentation

## 2018-10-23 DIAGNOSIS — R519 Headache, unspecified: Secondary | ICD-10-CM

## 2018-10-23 LAB — CBC WITH DIFFERENTIAL/PLATELET
Abs Immature Granulocytes: 0.23 10*3/uL — ABNORMAL HIGH (ref 0.00–0.07)
Basophils Absolute: 0.1 10*3/uL (ref 0.0–0.1)
Basophils Relative: 1 %
Eosinophils Absolute: 0.2 10*3/uL (ref 0.0–0.5)
Eosinophils Relative: 1 %
HCT: 42.6 % (ref 36.0–46.0)
Hemoglobin: 13.6 g/dL (ref 12.0–15.0)
Immature Granulocytes: 2 %
Lymphocytes Relative: 20 %
Lymphs Abs: 2.9 10*3/uL (ref 0.7–4.0)
MCH: 29.4 pg (ref 26.0–34.0)
MCHC: 31.9 g/dL (ref 30.0–36.0)
MCV: 92.2 fL (ref 80.0–100.0)
Monocytes Absolute: 0.7 10*3/uL (ref 0.1–1.0)
Monocytes Relative: 5 %
Neutro Abs: 10.5 10*3/uL — ABNORMAL HIGH (ref 1.7–7.7)
Neutrophils Relative %: 71 %
Platelets: 479 10*3/uL — ABNORMAL HIGH (ref 150–400)
RBC: 4.62 MIL/uL (ref 3.87–5.11)
RDW: 18.8 % — ABNORMAL HIGH (ref 11.5–15.5)
WBC: 14.5 10*3/uL — ABNORMAL HIGH (ref 4.0–10.5)
nRBC: 0 % (ref 0.0–0.2)

## 2018-10-23 LAB — BASIC METABOLIC PANEL
Anion gap: 9 (ref 5–15)
BUN: 8 mg/dL (ref 6–20)
CO2: 17 mmol/L — ABNORMAL LOW (ref 22–32)
Calcium: 7.6 mg/dL — ABNORMAL LOW (ref 8.9–10.3)
Chloride: 112 mmol/L — ABNORMAL HIGH (ref 98–111)
Creatinine, Ser: 0.87 mg/dL (ref 0.44–1.00)
GFR calc Af Amer: >60 mL/min (ref >60)
GFR calc non Af Amer: >60 mL/min (ref >60)
Glucose, Bld: 102 mg/dL — ABNORMAL HIGH (ref 70–99)
Potassium: 3.8 mmol/L (ref 3.5–5.1)
Sodium: 138 mmol/L (ref 135–145)

## 2018-10-23 LAB — MAGNESIUM: Magnesium: 1.8 mg/dL (ref 1.7–2.4)

## 2018-10-23 IMAGING — CT CT HEAD WITHOUT CONTRAST
3 series · 16 of 47 positions shown, 19 images · non-contrast
Comparison: [DATE]

CLINICAL DATA: Seizure, follow-up subarachnoid hemorrhage

EXAM:
CT HEAD WITHOUT CONTRAST
TECHNIQUE: Contiguous axial images were obtained from the base of the skull
through the vertex without intravenous contrast.

[Series 3: head 5.0 h30s · axial · 0.41mm/px · z∈[+1011,+1146]mm · 10 of 33 slices shown, 13 images]
[im 3/33  brain]
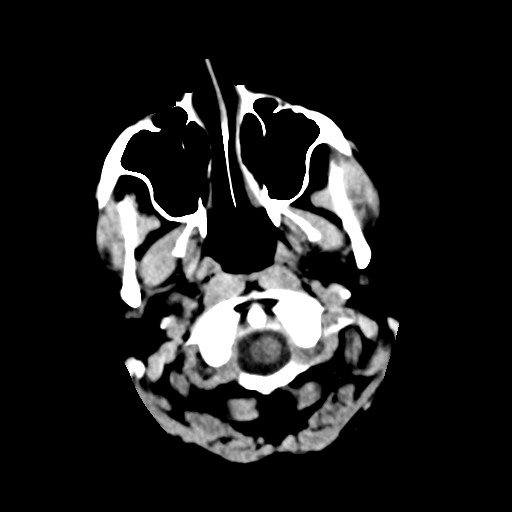
[im 3/33  bone]
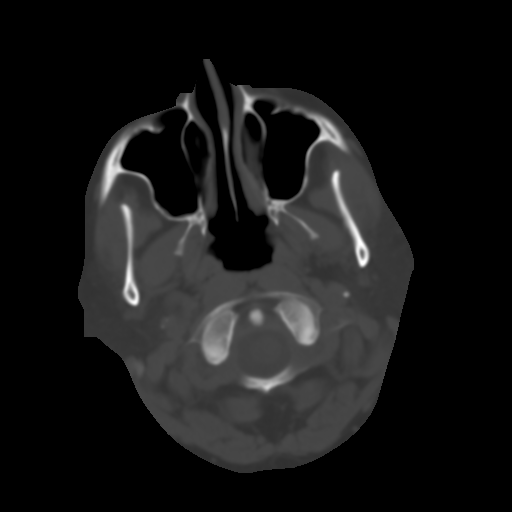
[im 6/33  brain]
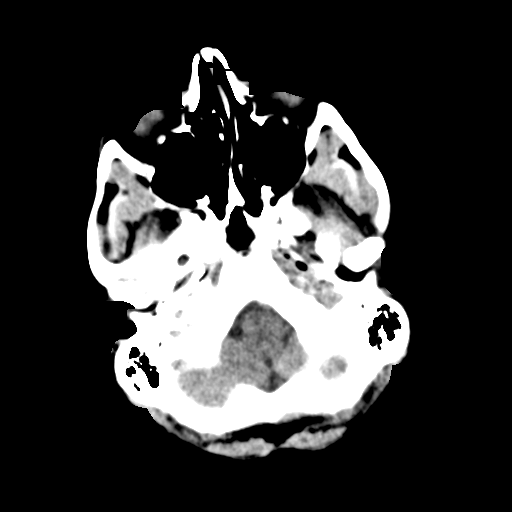
[im 9/33  brain]
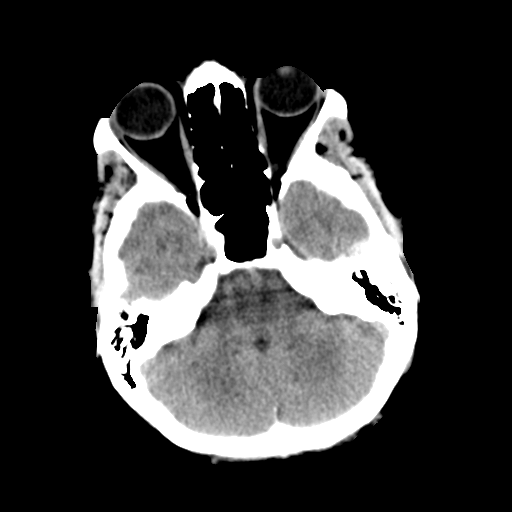
[im 12/33  brain]
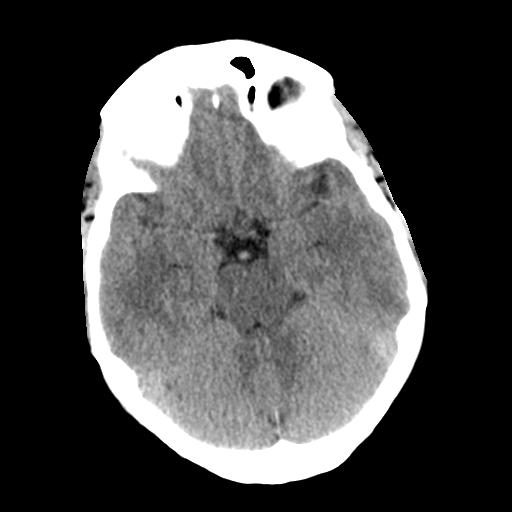
[im 15/33  brain]
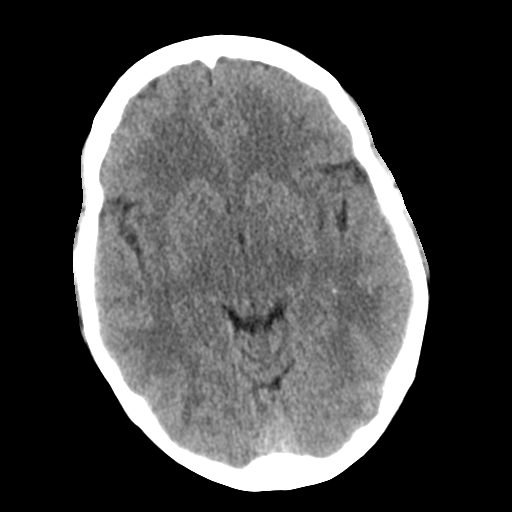
[im 15/33  bone]
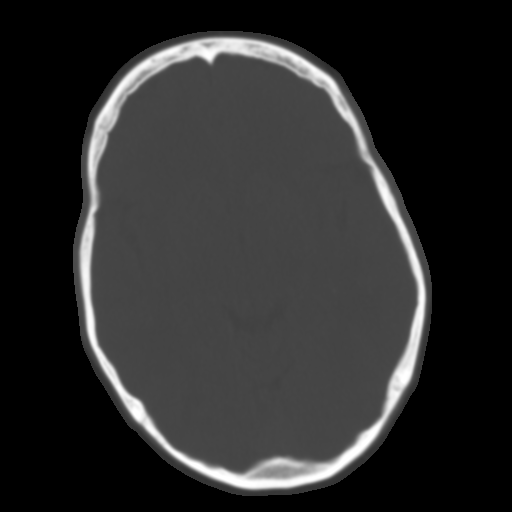
[im 18/33  brain]
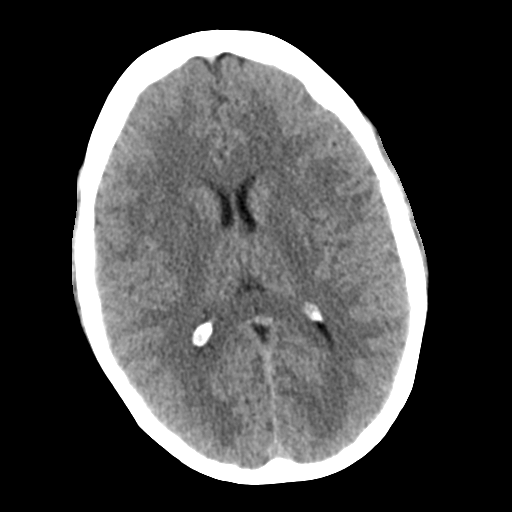
[im 21/33  brain]
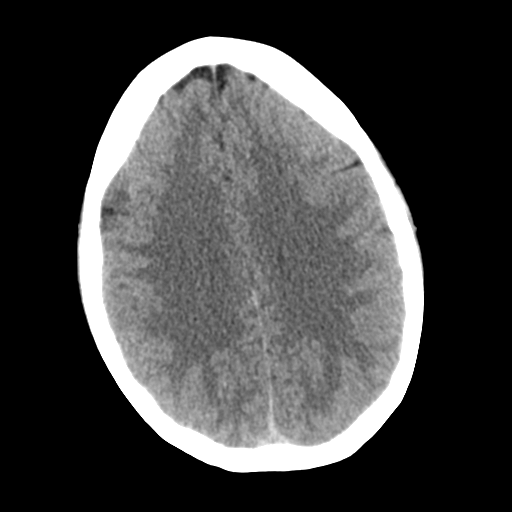
[im 25/33  brain]
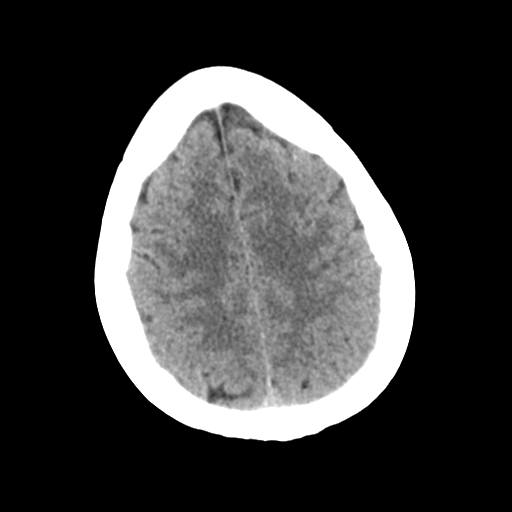
[im 27/33  brain]
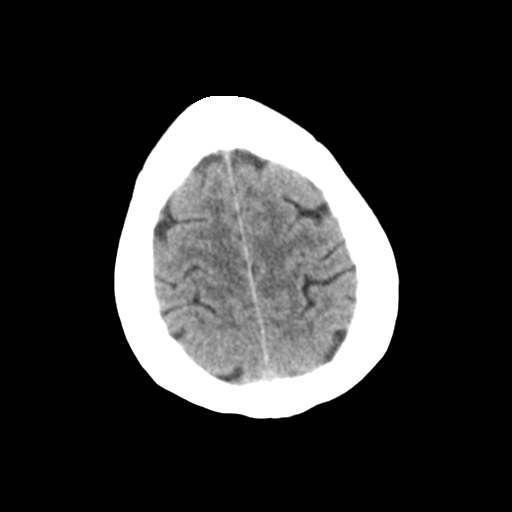
[im 27/33  bone]
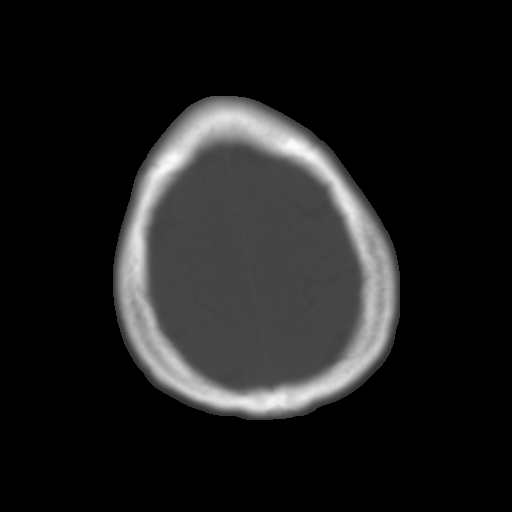
[im 30/33  brain]
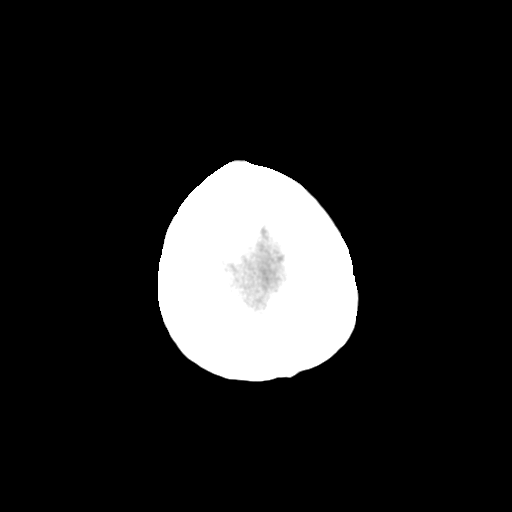

[Series 5: head 3.0 mpr cor · coronal · 0.32mm/px · 3 of 67 slices shown]
[im 23/67  brain]
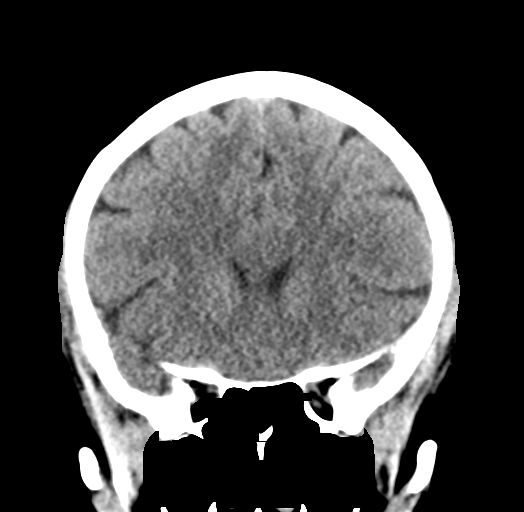
[im 30/67  brain]
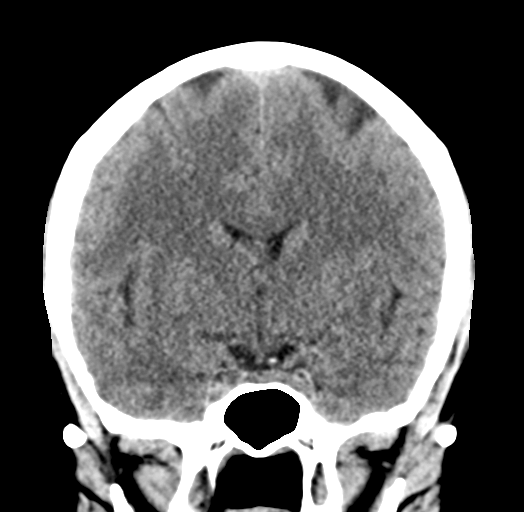
[im 37/67  brain]
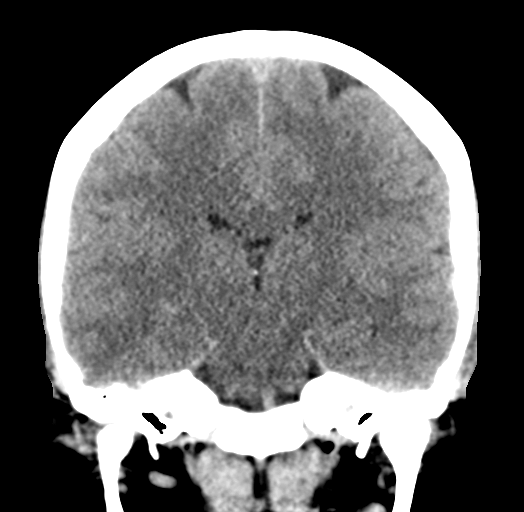

[Series 6: head 3.0 mpr sag · sagittal · 0.32mm/px · 3 of 55 slices shown]
[im 19/55  brain]
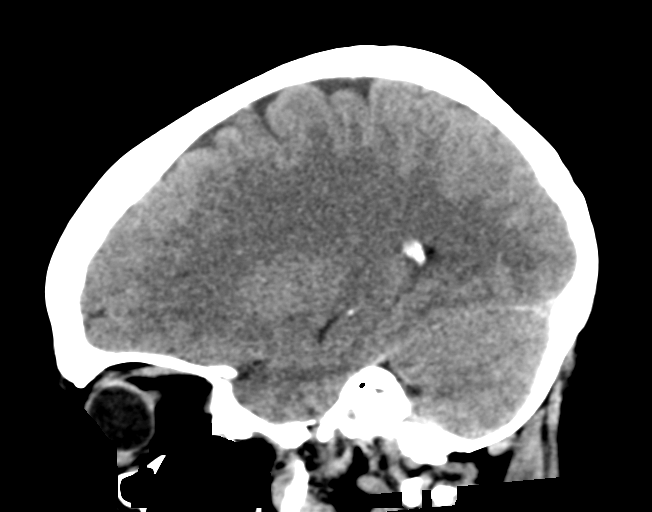
[im 28/55  brain]
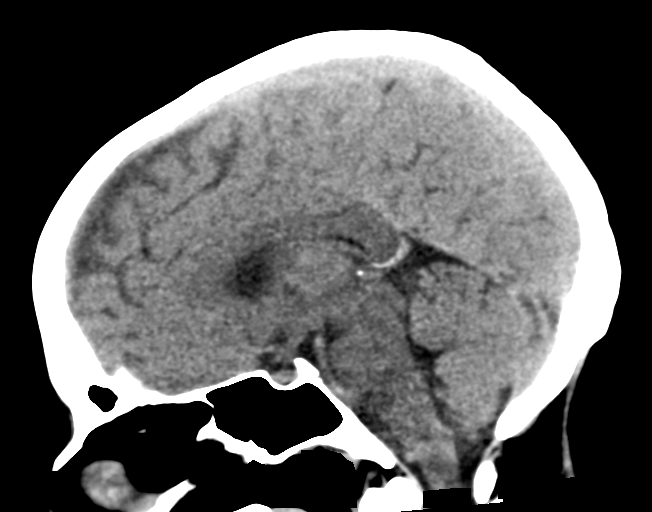
[im 37/55  brain]
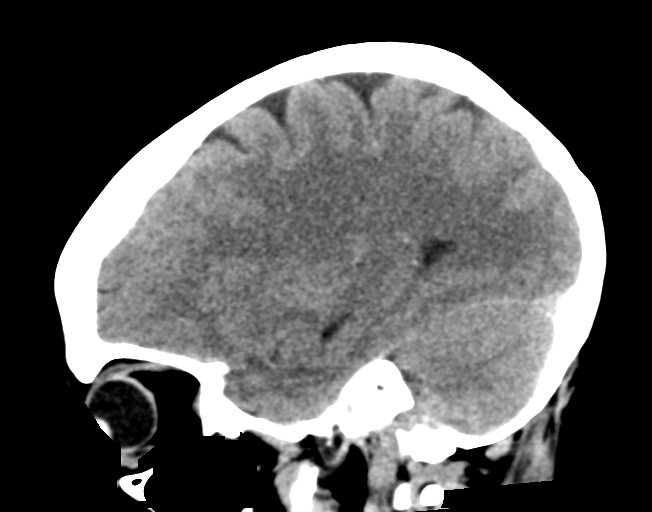

[16 of 47 positions shown; findings below may reference images not displayed]

FINDINGS: Brain: Previously seen small subarachnoid hemorrhage in the
posterior right frontal region no longer visualized. No new
hemorrhage. No hydrocephalus, acute infarction or midline shift.

Vascular: No hyperdense vessel or unexpected calcification.

Skull: No acute calvarial abnormality.

Sinuses/Orbits: Visualized paranasal sinuses and mastoids clear.
Orbital soft tissues unremarkable.

Other: None
IMPRESSION: Resolution of the previously seen right frontal subarachnoid
hemorrhage. No acute intracranial abnormality.

## 2018-10-23 MED ORDER — DEXAMETHASONE 4 MG PO TABS
10.0000 mg | ORAL_TABLET | Freq: Once | ORAL | Status: AC
  Administered 2018-10-23: 10 mg via ORAL
  Filled 2018-10-23: qty 3

## 2018-10-23 MED ORDER — DIPHENHYDRAMINE HCL 50 MG/ML IJ SOLN
25.0000 mg | Freq: Once | INTRAMUSCULAR | Status: AC
  Administered 2018-10-23: 25 mg via INTRAVENOUS
  Filled 2018-10-23: qty 1

## 2018-10-23 MED ORDER — SODIUM CHLORIDE 0.9 % IV BOLUS
1000.0000 mL | Freq: Once | INTRAVENOUS | Status: AC
  Administered 2018-10-23: 1000 mL via INTRAVENOUS

## 2018-10-23 MED ORDER — PROCHLORPERAZINE EDISYLATE 10 MG/2ML IJ SOLN
10.0000 mg | Freq: Once | INTRAMUSCULAR | Status: AC
  Administered 2018-10-23: 10 mg via INTRAVENOUS
  Filled 2018-10-23: qty 2

## 2018-10-23 NOTE — ED Notes (Signed)
Patient verbalizes understanding of discharge instructions. Opportunity for questioning and answers were provided. Armband removed by staff, pt discharged from ED ambulatory to home.  

## 2018-10-23 NOTE — ED Notes (Signed)
Patient transported to CT 

## 2018-10-23 NOTE — ED Triage Notes (Signed)
Pt states she had a seizure on Friday and has had a migraine since then. ( pt was seen and treated for the seizure/hit head). Pt has hx of migraines. Denies vision changes, denies neuro deficits. Denies n/v
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
## 2018-10-23 NOTE — Discharge Instructions (Signed)
Follow up with your neurologist.  Return for worsening headache, difficulty moving your arms or legs, new numbness or weakness, difficulty with speech or swallowing

## 2018-10-24 ENCOUNTER — Telehealth: Payer: Self-pay | Admitting: Neurology

## 2018-10-24 MED FILL — EMPTY CONTAINER: 30 days supply | Qty: 1 | Fill #0 | Status: AC

## 2018-10-24 MED FILL — EMPTY CONTAINER: 30 days supply | Qty: 1 | Fill #0

## 2018-10-24 MED FILL — STELARA 90 MG/ML SUBCUTANEOUS SYRINGE: 34 days supply | Qty: 1 | Fill #0 | Status: AC

## 2018-10-24 MED FILL — STELARA 90 MG/ML SUBCUTANEOUS SYRINGE: SUBCUTANEOUS | 34 days supply | Qty: 1 | Fill #0

## 2018-10-24 NOTE — Telephone Encounter (Signed)
Pt was seen at [facility] ED for seizures on 3/13. She did fall and hit her head on kitchen floor. She went back to ED last night to the ED for HA's. She said another CT was done last night which showed the bleeding on the brain was getting better. She has an appt on 4/2 but would like to be seen sooner if possible. Please call to advise.

## 2018-10-24 NOTE — Telephone Encounter (Signed)
Noted. I will call pt if something becomes available

## 2018-10-24 NOTE — Telephone Encounter (Signed)
Patient has appointment on 4/2 as of now that is the soonest we have available.   [name], she is wanting a sooner appointment, If another provider has a sooner opening you can transfer to them.

## 2018-10-25 MED FILL — MAGNESIUM OXIDE 400 MG (241.3 MG MAGNESIUM) TABLET: 30 days supply | Qty: 120 | Fill #0 | Status: AC

## 2018-10-29 ENCOUNTER — Telehealth: Payer: Self-pay

## 2018-10-29 ENCOUNTER — Ambulatory Visit: Payer: Medicaid Other | Admitting: Neurology

## 2018-10-29 NOTE — Telephone Encounter (Signed)
I contacted the pt and advised due to the covid 19 concerns we are limiting the number of pt's coming to the clinic. I advised we are currently working through the process of telephone and e-visits and we would be in touch regarding the next steps.  Pt verbalized understanding.

## 2018-10-30 NOTE — Unmapped (Signed)
Attempted to call Mary Hawkins at preferred phone number x 2 at her scheduled appointment time of 11:00 AM and again at 11:10 AM but she did not pick up the phone and phone continuously rang/no voicemail set up.

## 2018-10-30 NOTE — Telephone Encounter (Signed)
I contacted the pt and advised we would be able to schedule a video visit for the new pt canceled from 10/29/18.  I advised pt the video visit would be billed through her insurance and due to hippa is not as secure as a face to face visit. Pt provided verbal consent for video visit and has been scheduled for 11/02/18 at 1130 am with Dr. Jannifer [place]. Link for webex meeting has been sent to the pt.   I reviewed medications, allergies, pharmacy and history with the pt.

## 2018-10-30 NOTE — Addendum Note (Signed)
Addended by: Verlin Grills T on: 10/30/2018 02:49 PM   Modules accepted: Orders

## 2018-11-02 ENCOUNTER — Other Ambulatory Visit: Payer: Self-pay

## 2018-11-02 ENCOUNTER — Ambulatory Visit: Payer: Medicaid Other | Admitting: Neurology

## 2018-11-06 ENCOUNTER — Telehealth: Admit: 2018-11-06 | Discharge: 2018-11-07 | Payer: MEDICAID | Attending: Internal Medicine | Primary: Internal Medicine

## 2018-11-06 DIAGNOSIS — R609 Edema, unspecified: Principal | ICD-10-CM

## 2018-11-06 DIAGNOSIS — K50919 Crohn's disease, unspecified, with unspecified complications: Principal | ICD-10-CM

## 2018-11-06 DIAGNOSIS — R197 Diarrhea, unspecified: Principal | ICD-10-CM

## 2018-11-06 DIAGNOSIS — F411 Generalized anxiety disorder: Principal | ICD-10-CM

## 2018-11-06 DIAGNOSIS — F329 Major depressive disorder, single episode, unspecified: Principal | ICD-10-CM

## 2018-11-06 DIAGNOSIS — R42 Dizziness and giddiness: Principal | ICD-10-CM

## 2018-11-06 MED ORDER — ONDANSETRON 8 MG DISINTEGRATING TABLET
ORAL_TABLET | Freq: Three times a day (TID) | ORAL | 1 refills | 0.00000 days | Status: CP | PRN
Start: 2018-11-06 — End: 2018-12-28
  Filled 2018-11-08: qty 60, 20d supply, fill #0

## 2018-11-06 MED ORDER — FAMOTIDINE 20 MG TABLET
ORAL_TABLET | Freq: Two times a day (BID) | ORAL | 3 refills | 90.00000 days | Status: CP
Start: 2018-11-06 — End: 2019-03-05
  Filled 2018-11-08: qty 60, 30d supply, fill #0

## 2018-11-06 MED ORDER — CHOLESTYRAMINE (WITH SUGAR) 4 GRAM POWDER FOR SUSP IN A PACKET
PACK | Freq: Two times a day (BID) | ORAL | 5 refills | 0 days | Status: CP
Start: 2018-11-06 — End: 2019-04-09
  Filled 2018-11-08: qty 60, 30d supply, fill #0

## 2018-11-06 MED ORDER — DULOXETINE 60 MG CAPSULE,DELAYED RELEASE
Freq: Every day | ORAL | 0 days
Start: 2018-11-06 — End: 2018-11-18

## 2018-11-06 MED ORDER — HYDROXYZINE (ATARAX) 50 MG CAPSULE/TABLET WRAPPER
ORAL_TABLET | Freq: Three times a day (TID) | ORAL | 5 refills | 30 days | Status: CP | PRN
Start: 2018-11-06 — End: 2019-11-06
  Filled 2018-11-08: qty 90, 30d supply, fill #0

## 2018-11-06 NOTE — Unmapped (Addendum)
1. Need to get labs checked - I entered orders for [facility] so please look up closest location to you  2. If your magnesium level is very low again, will likely have to go to ED for electrolyte repletion. Then we may need to consider getting you set up for intermittent IV fluids at home containing magnesium. This would mean we need to consider placing a port for longer term IV access.  3. After we got off the phone I had a thought - stop your Protonix/Nexium as this can worsen low magnesium levels. Instead we can try a different medication called Pepcid (H2 blocker rather than PPI) that you will take twice daily, I sent a prescription for this to the pharmacy.  4. Start Questran (bile acid binder) 1 packet twice daily. Separate this medication from a few hours from your other medicines so that it does not bind up your medicines like it binds up the excess bile.  5. Update me in 1-2 weeks with your symptoms and if Questran has not helped, we can consider a medication called clonidine that is commonly used for high blood pressure but that can also help to slow down stool output.   6. Continue Stelara injections every 8 weeks.   7. When life gets back to normal after coronavirus, we should plan on repeating your upper endoscopy and colonoscopy. I don't think these are emergent right now so I will hold off on ordering but we can always reassess based upon your symptoms.     ??????Information from the [facility] [employer] [facility] [facility] on [employer] with IBD who are taking medications that suppress their immune system may be at increased risk for infection. Medications with the highest risk of infection are prednisone, azathioprine, methotrexate, and 6-mercaptopurine. Anti-TNF medications (Remicade, Humira, Cimzia, Simponi) and small molecule inhibitors Harriette Ohara) carry a lower risk of infectious complications. Other biologic medications including Entyvio and Stelara have a very low risk for infections which is similar to the general healthy population.    There are preventive actions you can take to protect yourself from exposure to the virus and prevent the spread of the disease. The CDC recommends the following ways to limit your risk of infection:     ?? Avoid having close contact with people who are sick.  ?? Do not touch your nose, eyes, and mouth if you have not washed your hands.  ?? Wash your hands with soap and water for at least 20 seconds. To ensure you wash for the appropriate amount of time, sing ???Corrie Dandy Had a Little Lamb??? or another short song.   ?? If soap and water are not available to you, use a hand sanitizer that contains at least 60% alcohol. Check the label to confirm the amount of alcohol.  ?? Limit non-essential domestic and international travel to areas with confirmed outbreaks or states of emergency.   ??   Here are some other actions to take if you are feeling ill or you think you may have been exposed COVID-19:  ?? Stay home if you are feeling sick.  ?? Contact your doctor so we can determine if your medications need to be adjusted.   ?? Cover your cough or sneeze with a tissue, and throw it in the trash.  ?? Clean and disinfect frequently touched objects.  ?? Wear a facemask if you are showing symptoms or have been diagnosed with COVID-19. Wearing a mask will reduce the potential spread of the virus  to others. The CDC does not recommend wearing a face mask to prevent becoming infected.

## 2018-11-06 NOTE — Unmapped (Signed)
A minimum of 60 minutes of integrated behavioral health services have been provided during this calendar month. See previous patient outreach encounter notes for details of services and durations.

## 2018-11-06 NOTE — Unmapped (Addendum)
[facility]  TELEPHONE NOTE - INFLAMMATORY BOWEL DISEASE  11/06/2018    Demographics:  Mary Hawkins is a 38 y.o. female with Crohn's disease    Referring physician:   [facility]          HPI / NOTE :     Today, I spoke to Mary Hawkins for follow up consultation in the [facility] at the request of [facility] regarding Crohn's Disease.  Outside records including available clinical notes, endoscopy reports, imaging results and pathology results were reviewed in detail as part of this follow up consultation.     Phone visit due to COVID-19 pandemic. Patient consented to phone visit.  Time spent on phone 26 minutes 44 seconds   Called [phone]    HPI:  Since last visit, patient hospitalized at OSH due to seizure like activity. CT Head showed small SAH. She was found to have Mg 0.7 when she presented and was 0.6 on day of discharge (got IV Mg and improved to 2.4). K was 2.9 upon admission. She had also been taking Cymbalta 60 mg BID but this dose was decreased to 60 mg daily due to concern that this contributed to her seizure like episode. She went back to ED on 10/23/18 for headache which showed SAH had resolved and her Mg was 1.8 at that time. She is now taking mag oxide 800 mg BID and KCl 20 mEq daily. Has never had port or needed home IVFs/Mg at home in the past although Mg levels have always been low. No labs checked since in the hospital    She didn't completely stop Cymbalta but decreased to 60 mg daily from BID dosing. She remains on Remeron and Atarax 2-3 times per day.    Says her GI symptoms are about the same as how they have been in the past. Not great. On Stelara 90 mg [place] every 8 weeks, last dose 10/30/18, denies any missed doses. Having 5-10 loose BMs per day with occasional incontinence overnight. She is not taking any scheduled antidiarrheals; taking Lomotil at night as needed but not on a daily basis. She was on high does Colestid in the past but doesn't think this helped to slow things down. Has never tried Questran in the past. Also gave trial of Flagyl for empiric treatment for SIBO after last visit but this didn't help anything. She was on Entocort but self-discontinued this as didn't think it did anything to help with her diarrhea.     She still feels swollen, bloated, and says she is gaining weight. She can't tell me her exact weight today. Had echo 12/2017 when intubated in ICU with ARDS 2/2 rhinovirus with normal LVEF at that time.     Remains on PPI - taking up to 3-4 times per day due to bad heartburn. No dysphagia.     Remains on vitamin D weekly. Also taking vitamin B12 injections monthly.     Reports some intermittent lower abdominal pain. She does endorse having some dizziness but denies any falls. Trying to stay hydrated; mainly drinking water with occasional juice/mountain dew. Unfortunately she has gone back to smoking, 1/2 ppd of cigarettes. No alcohol or other drug use.    Stressful having son at home with homeschooling as well due to COVID-19.    Review of Systems: positive as per HPI   Otherwise, the balance of 10 systems is negative.          IBD  HISTORY:     Year of disease onset:  2010  Diagnosis:  Crohn's Disease  Age at onset:   74-40 yr old (A2)  Location:  [place] (L3)  Behavior:  Stricturing (B2)  Perianal Dz:  No    Brief IBD Disease Course:    2010 diagnosed with stricturing ileal Crohn's disease, treated 8 months with prednisone. 02/2010 start of Humira. 03/2011 stop Humira due to recurrent skin infections, started Entocort, issues with rectal prolapse and found to have ileal stricture. 11/13/13 ileocecal resection with Dr. Epifania Gore (28 cm of ileum resected, 5 cm colon resected - path c/w Crohn's disease but no granulomas), also with incidental Meckel's diverticulum noted and resected, mesh rectopexy for rectal prolapse. 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 03/18/14 discussed Remicade/Imuran but did not start these medications. 5/1-5/15/19 admission to [facility] for ARDS 2/2 rhinovirus requiring mechanical ventilation, AKI. 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 02/16/18 IV loading dose of ustekinumab followed by ustekinumab 90 mg [place] every 8 weeks; self-discontinued ustekinumab. 08/2018 repeat IV load ustekinumab.    Endoscopy:      Colon 12/27/17: Mild inflammation and a few ulcerations at ileocolonic anastomosis. Rutgeert's i2. Localized mildly granular mucosa in the sigmoid colon.  - path: Small bowel - patchy crypt architectural distortion with Paneth cell hyperplasia, consistent with mild chronic active enteritis, negative for dysplasia, no granulomas. Colon - unremarkable colonic mucosa.   EGD 12/27/17: Grade D esophagitis, small hiatal hernia, otherwise normal stomach and duodenum.   - path: ulceration with inflamed granulation tissue, no granulomas  Colon 02/20/14: Multiple discrete ulcerations seen corresponding to Rutgeert's i2.   EGD 11/12/13: Normal esophagus. Markedly thickened gastric folds without inflammation seen. Normal duodenum.     Imaging:    CT Head 10/23/18: Resolution of the previously seen right frontal subarachnoid  hemorrhage. No acute intracranial abnormality.    CT Head/CT Cervical Spine 10/20/18: Suggestion of tiny focus of acute subarachnoid hemorrhage in the right posterior frontal convexity. This may represent a tiny surface contusion. Normal alignment of the cervical spine. No acute displaced fractures identified.    CT Abd/Pelvis 08/25/18: Multiple fluid-filled loops of small bowel and fluid throughout the colon as can be seen with enterocolitis. Otherwise no acute abdominal or pelvic pathology. Left ovarian cyst.    CT A/P 12/24/17: Mild wall thickening of the neoterminal ileum, possibly mild ileus. Right lower quadrant adenopathy, probably reactive.    Echo 12/09/17: - Left ventricle: The cavity size was normal. Wall thickness was   ??increased in a pattern of mild LVH. Systolic function was   ??vigorous. The estimated ejection fraction was in the range of 65%   ??to 70%. Doppler parameters are consistent with abnormal left   ??ventricular relaxation (grade 1 diastolic dysfunction).  - Pulmonary arteries: PA peak pressure: 42 mm Hg (S).    Prior IBD medications (type, dose, duration, response):  - prednisone  - Entocort  - adalimumab 2011-2012, good response but developed recurrent skin infections prompting discontinuation  - ustekinumab 02/2018 - 04/2018 before self discontinued, reload 08/2018 and now 90 mg [place] every 8 weeks             Past Medical History:   Past medical history:   Past Medical History:   Diagnosis Date   ??? Anxiety    ??? Arthritis    ??? Back pain    ??? Crohn disease (CMS-HCC)    ??? Eczema    ??? Endometriosis    ??? GERD (  gastroesophageal reflux disease)    ??? Headache(784.0)    ??? Hypertension    ??? Ulcerative colitis (CMS-HCC)      Past surgical history:   Past Surgical History:   Procedure Laterality Date   ??? BREAST RECONSTRUCTION  2004   ??? ENDOMETRIAL ABLATION     ??? PR COLONOSCOPY FLX DX W/COLLJ SPEC WHEN PFRMD N/A 11/12/2013    Procedure: COLONOSCOPY, FLEXIBLE, PROXIMAL TO SPLENIC FLEXURE; DIAGNOSTIC, W/WO COLLECTION SPECIMEN BY BRUSH OR WASH;  Surgeon: Teodoro Spray, MD;  Location: [facility];  Service: Gastroenterology   ??? PR COLONOSCOPY FLX DX W/COLLJ SPEC WHEN PFRMD N/A 02/20/2014    Procedure: COLONOSCOPY, FLEXIBLE, PROXIMAL TO SPLENIC FLEXURE; DIAGNOSTIC, W/WO COLLECTION SPECIMEN BY BRUSH OR WASH;  Surgeon: Billie Ruddy, MD;  Location: [facility];  Service: Gastroenterology   ??? PR COLONOSCOPY FLX DX W/COLLJ SPEC WHEN PFRMD N/A 12/27/2017    Procedure: COLONOSCOPY, FLEXIBLE, PROXIMAL TO SPLENIC FLEXURE; DIAGNOSTIC, W/WO COLLECTION SPECIMEN BY BRUSH OR WASH;  Surgeon: Bronson Curb, MD;  Location: [facility];  Service: Gastroenterology   ??? PR REMVL COLON & TERM ILEUM W/ILEOCOLOSTOMY N/A 11/13/2013 Procedure: Ileocecectomy, meckel's diverticulectomy, mesh rectopexy;  Surgeon: Emilia Beck, MD;  Location: MAIN OR [place];  Service: Gastrointestinal   ??? PR UPPER GI ENDOSCOPY,BIOPSY N/A 11/12/2013    Procedure: UGI ENDOSCOPY; WITH BIOPSY, SINGLE OR MULTIPLE;  Surgeon: Teodoro Spray, MD;  Location: [facility];  Service: Gastroenterology   ??? PR UPPER GI ENDOSCOPY,BIOPSY N/A 12/27/2017    Procedure: UGI ENDOSCOPY; WITH BIOPSY, SINGLE OR MULTIPLE;  Surgeon: Bronson Curb, MD;  Location: [facility];  Service: Gastroenterology   ??? RECTAL PROLAPSE REPAIR       Family history:   Family History   Problem Relation Age of Onset   ??? Esophageal cancer Maternal Grandfather    ??? Cancer Maternal Grandfather         Gastric cancer     Social history: Single - has son. She is currently smoking 1/2 ppd (down from 1 ppd) - she had quit in the hospital but started again. Occasional alcohol use. No illicit drug use.   Social History     Socioeconomic History   ??? Marital status: Single     Spouse name: Not on file   ??? Number of children: Not on file   ??? Years of education: Not on file   ??? Highest education level: Not on file   Occupational History   ??? Not on file   Social Needs   ??? Financial resource strain: Not on file   ??? Food insecurity     Worry: Not on file     Inability: Not on file   ??? Transportation needs     Medical: Not on file     Non-medical: Not on file   Tobacco Use   ??? Smoking status: Current Every Day Smoker     Packs/day: 1.00     Types: Cigarettes     Last attempt to quit: 12/06/2017     Years since quitting: 0.9   ??? Smokeless tobacco: Never Used   Substance and Sexual Activity   ??? Alcohol use: Yes     Comment: socially   ??? Drug use: No   ??? Sexual activity: Not on file   Lifestyle   ??? Physical activity     Days per week: Not on file     Minutes per  session: Not on file   ??? Stress: Not on file   Relationships   ??? Social Wellsite geologist on phone: Not on file     Gets together: Not on file     Attends religious service: Not on file     Active member of club or organization: Not on file     Attends meetings of clubs or organizations: Not on file     Relationship status: Not on file   Other Topics Concern   ??? Not on file   Social History Narrative    Currently unemployed.  She is a 29-year-old son which takes care of.             Allergies:     Allergies   Allergen Reactions   ??? Penicillins Hives     Has patient had a PCN reaction causing immediate rash, facial/tongue/throat swelling, SOB or lightheadedness with hypotension: Yes  Has patient had a PCN reaction causing severe rash involving mucus membranes or skin necrosis: Unk  Has patient had a PCN reaction that required hospitalization: Yes  Has patient had a PCN reaction occurring within the last 10 years: No  If all of the above answers are NO, then may proceed with Cephalosporin use.  Has patient had a PCN reaction causing immediate rash, facial/tongue/throat swelling, SOB or lightheadedness with hypotension: Yes  Has patient had a PCN reaction causing severe rash involving mucus membranes or skin necrosis: Unk  Has patient had a PCN reaction that required hospitalization: Yes  Has patient had a PCN reaction occurring within the last 10 years: No  If all of the above answers are NO, then may proceed with Cephalosporin use.   ??? Doxycycline Hcl Other (See Comments)     Reaction not recalled             Medications:     Current Outpatient Medications   Medication Sig Dispense Refill   ??? acetaminophen (TYLENOL) 500 MG tablet Take 1,000 mg by mouth every six (6) hours as needed for pain.     ??? colestipoL (COLESTID) 1 gram tablet Take 6 tablets (6 g total) by mouth Two (2) times a day. 360 tablet 0   ??? cyanocobalamin 1,000 mcg/mL injection Inject 1 mL (1,000 mcg total) under the skin every thirty (30) days. 4 mL 2   ??? diphenoxylate-atropine (LOMOTIL) 2.5-0.025 mg per tablet Take 1 tablet by mouth Three (3) times a day as needed for diarrhea. 60 tablet 1   ??? DULoxetine (CYMBALTA) 60 MG capsule Take 1 capsule (60 mg total) by mouth Two (2) times a day. 60 capsule 5   ??? empty container Misc Use as directed 1 each PRN   ??? ergocalciferol (DRISDOL) 50,000 unit capsule Take 1 capsule (50,000 Units total) by mouth once a week. 8 capsule 0   ??? ergocalciferol (VITAMIN D2) 1,250 mcg (50,000 unit) capsule TAKE 1 CAPSULE BY MOUTH ONCE A WEEK 4 capsule 0   ??? hydrOXYzine (ATARAX) 50 mg capsule/tablet Take 1 each (50 mg total) by mouth Three (3) times a day as needed for anxiety. 90 tablet 5   ??? magnesium oxide (MAG-OX) 400 mg (241.3 mg magnesium) tablet Take 2 tablets (800 mg total) by mouth Two (2) times a day. 120 tablet 5   ??? mirtazapine (REMERON) 30 MG tablet Take 1 tablet (30 mg total) by mouth nightly. 30 tablet 5   ??? ondansetron (ZOFRAN-ODT) 8 MG disintegrating tablet Take 1 tablet (8  mg total) by mouth every eight (8) hours as needed for nausea for up to 14 days. 30 tablet 1   ??? ondansetron (ZOFRAN-ODT) 8 MG disintegrating tablet DISSOLVE 1 TABLET ON THE TONGUE EVERY 8 HOURS AS NEEDED FOR NAUSEA 30 tablet 1   ??? potassium chloride (KLOR-CON) 20 MEQ CR tablet Take 1 tablet (20 mEq total) by mouth daily. 30 tablet 5   ??? potassium chloride SA (K-DUR,KLOR-CON) 20 MEQ tablet Take 1 tablet (20 mEq total) by mouth Two (2) times a day. 60 tablet 2   ??? syringe with needle, safety (BD SAFETY-LOK DETACHABLE NEEDL) 3 mL 25 gauge x 5/8 Syrg 1 each by Miscellaneous route every thirty (30) days. 25 each 0   ??? ustekinumab (STELARA) 90 mg/mL Syrg syringe Inject the contents of 1 syringe under the skin every 8 weeks 1 mL 3     No current facility-administered medications for this visit.              Physical Exam:     Physical exam was deferred in the setting of telephone visit.          Labs, Data & Indices:     Lab Review:   Lab Results   Component Value Date    WBC 21.1 (H) 09/09/2018    WBC 8.6 02/20/2014    RBC 4.46 09/09/2018    RBC 4.09 02/20/2014    HGB 12.8 09/09/2018 HGB 11.6 (L) 02/20/2014     Lab Results   Component Value Date    AST 15 09/09/2018    AST 21 02/18/2014    ALT 13 09/09/2018    ALT 15 02/18/2014    BUN 19 09/09/2018    BUN 8 03/18/2014    Creatinine 0.88 09/09/2018    Creatinine 0.86 03/18/2014    CO2 15.0 (L) 09/09/2018    CO2 27 03/18/2014    Albumin 4.1 09/09/2018    Albumin 3.9 03/18/2014    Calcium 9.7 09/09/2018    Calcium 7.3 (L) 03/18/2014     Lab Results   Component Value Date    TSH 3.433 (H) 01/19/2018    TSH 4.50 (H) 03/18/2014      HBsAg neg   HBcAb total neg  HBsAb neg  Quantiferon gold neg  TPMT normal  ............................................................................................................................................Marland Kitchen          Assessment & Recommendations:     Mary Hawkins is a 38 y.o. female with history of stricturing??ileal Crohn's disease??s/p ileocecal resection, mesh retropexy for rectal prolapse, incidental Meckel's diverticulum s/p resection 11/13/13 who I spoke to today for follow up. She is now maintained on ustekinumab 90 mg [place] every 8 weeks (restarted in January 2020 after self-discontinuing in September 2019) and unfortunately has started smoking again. She has ongoing GI symptoms particularly diarrhea, rare nighttime incontinence which may be due to active inflammation from Crohn's disease and likely contributor of bile acid diarrhea. Empirically treated for SIBO since her last visit but had no benefit with trial of antibiotics. She has been admitted to OSH for seizure-like activity and small SAH which has since resolved; seizures attributed to combination of electrolyte abnormalities (Mg 0.7, K 2.9) as well as psychiatric medications (was on Cymbalta 60 mg BID in addition to Remeron/Atarax). Has not had labs checked since discharge and suspect that she will again have hypoMg, hypoK as has been relatively refractory to oral supplementation. Will repeat labs and if confirm ongoing hypomagnesemia, she will need to go to ED for repletion but  then we will need to strongly consider port placement for intermittent IVFs with Mg either at home or infusion center. Will also stop her PPI as this can worsen hypoMg and replace with H2 blocker. Will also work to slow down her diarrhea with Questran to see if this is more effective than Colestid and can consider clonidine patch if this is ineffective (she has been hypertensive at every clinic visit and we wouldn't have to worry about absorption issues with the patch). She will ultimately need repeat EGD/colonoscopy to assess response to ustekinumab but will hold off on ordering studies at this time given COVID-19 pandemic as not urgent/emergent.     Given her prior infectious complications, will check immunoglobulin levels to ensure she would not benefit from IVIG if this ended up being more of a CVID-type picture that can closely mimic Crohn's disease.     Additionally, she reports swelling in legs/abdomen as well as weight gain although she cannot quantify weight gain. I asked her to keep a log of weight at home and contact me with the trend. Will check pro-BNP and thyroid studies with labs and if pro-BNP elevated may need to consider repeat echocardiogram (she did have normal LVEF in 12/2017).     She also continues to struggle with significant anxiety/depression and is enrolled in [facility] program. I spoke with both Judeth Cornfield and Dr. Lonell Face who both have recommended referral to Psychiatry for ongoing management given recent hospitalization possibly due to side effect of antidepressant - this referral was placed several weeks ago and patient aware of need to see Psychiatry for ongoing management.     PLAN:  1. Crohn's disease - continue ustekinumab 90 mg [place] every 8 weeks. Also start Questran 1 packet BID as bile acid diarrhea likely contributing to symptoms. Contact me in 1-2 weeks with update and if no improvement will start clonidine patch as has always been hypertensive and this may help slow output. Discussed need for labs at [facility] - CBC, CMP, Mg, CRP, IgA, IgG, IgM sent today. If Mg continues to be low despite supplementation will need to go to ED for repletion and we will need to consider port placement with IVFs containing Mg at home or infusion center. She will continue monthly B12 shots at home as well as weekly vitamin D supplement. She needs repeat EGD/colonoscopy this summer but will hold off on ordering until COVID-19 pandemic has calmed down. Also reinforced importance of tobacco cessation.     2. Hypomagnesemia: Continue mag oxide 800 mg BID, can't increase more d/t worsening diarrhea. Labs today at [facility]. Stop PPI. Start bile acid sequestrant and consider clonidine patch as above. If no improvement will need port for intermittent IVFs/Mg.     3. History of iron deficiency anemia - likely from chronic GI blood loss due to active Crohn's disease off therapy for many years. This had improved on most recent set of labs but will repeat CBC today.     4. Esophagitis - likely a result of her prolonged hospitalization for ARDS 2/2 Rhinovirus and unrelated to her Crohn's disease. She has been on BID PPI and reports ongoing heartburn. Have asked her to STOP PPI and switch to H2 blocker BID in the event this is contributing to her hypoMg.    5. Tobacco abuse - counseled extensively on the need for tobacco cessation for her Crohn's disease. Tobacco cessation is as important as starting a new medication.    6. Chronic abdominal pain, other body pains, history  of fibromyalgia - eventually will need repeat EGD/colon to restage disease but holding off given COVID-19. She will continue at reduced dose of Cymbalta.     7. Swelling in legs/abdomen: Encouraged log daily weights and update me. Also encouraged compression stockings. Will see if albumin is low on labs as could be explained by hypoalbuminemia and will also check pro-BNP, if this is signficantly elevated may need to consider repeat echocardiogram to r/o cardiomyopathy.     8. Depression, anxiety: No SI/HI. Triggers include chronic illness, home stressors, recent deaths in family. She is enrolled in [facility] program but given recent seizures attributed to either high dose Cymbalta vs electrolyte deficiencies, have referred to Psychiatry for ongoing management. She has reduced Cymbalta from 60 mg BID to 60 mg daily; also remains on Remeron 30 mg nightly and using Atarax 2-3 times daily PRN.     9. Prevention - discussed importance of tobacco cessation as above. Previously discussed need for annual influenza vaccine, pneumococcal vaccine series, and hepatitis B vaccine series. She reports being up to date on tetanus shot. She received Prevnar 01/19/18 as well as first hepatitis B vaccine 01/19/18. Will need Pneumovax and next hepatitis B vaccine at next in person visit.    10. Follow up in 3 months or sooner as needed, will order endoscopies at that time.    Time spent on phone 26 minutes 44 seconds    Patient was discussed with Dr. Erma Heritage who agrees with above assessment and plan.   --------------------------------------------  Mardee Postin, M.D.  [facility]  Multidisciplinary Inflammatory Bowel Disease Clinic       GI TEACHING PHYSICIAN NOTE    I was available for phone call.  I reviewed patient with Dr. [name] before and after phone call.  I agree with assessment and recommendations as discussed above.      Kim L. Erma Heritage, MD PhD  Professor of Medicine  Division of Gastroenterology and Hepatology  [id number], Room [address] [place], [place] [address]  Phone: [phone]

## 2018-11-07 MED ORDER — MIRTAZAPINE 30 MG TABLET
ORAL_TABLET | Freq: Every evening | ORAL | 5 refills | 30 days | Status: CP
Start: 2018-11-07 — End: 2019-04-09
  Filled 2018-11-08: qty 30, 30d supply, fill #0

## 2018-11-08 ENCOUNTER — Ambulatory Visit: Payer: Medicaid Other | Admitting: Neurology

## 2018-11-08 MED FILL — HYDROXYZINE HCL 50 MG TABLET: 30 days supply | Qty: 90 | Fill #0 | Status: AC

## 2018-11-08 MED FILL — FAMOTIDINE 20 MG TABLET: 30 days supply | Qty: 60 | Fill #0 | Status: AC

## 2018-11-08 MED FILL — ONDANSETRON 8 MG DISINTEGRATING TABLET: 20 days supply | Qty: 60 | Fill #0 | Status: AC

## 2018-11-08 MED FILL — CHOLESTYRAMINE (WITH SUGAR) 4 GRAM POWDER FOR SUSP IN A PACKET: 30 days supply | Qty: 60 | Fill #0 | Status: AC

## 2018-11-08 MED FILL — MIRTAZAPINE 30 MG TABLET: 30 days supply | Qty: 30 | Fill #0 | Status: AC

## 2018-11-09 NOTE — Unmapped (Signed)
B12 added to [facility] orders per patient request.

## 2018-11-15 NOTE — Unmapped (Signed)
Behavioral Health Care Management            Date of Service:  11/15/2018        Date of Last Encounter:  10/24/2018    Purpose of contact:     [place] [facility] Social Worker contacted the patient as part of the [facility]MP- LVM.    Follow Up Plan/Next Steps:    []  Schedule follow up appointment with GI Physician  []  Provide referral/linkage information:  []  Consult with GI Physician, re: care plan and patient's treatment progress  [x]  Other: f/u mood, administer PHQ9/GAD7      Additional Information/Plan:  Pt was provided with my direct office phone number should additional needs arise.    No future appointments.    Ottie Glazier, MSW, M.Ed, LCSWA  Social Worker- MSW  [facility]  [phone]

## 2018-11-19 MED ORDER — DULOXETINE 60 MG CAPSULE,DELAYED RELEASE
ORAL_CAPSULE | Freq: Every day | ORAL | 11 refills | 0 days
Start: 2018-11-19 — End: 2018-12-02

## 2018-11-19 MED FILL — CYANOCOBALAMIN (VIT B-12) 1,000 MCG/ML INJECTION SOLUTION: SUBCUTANEOUS | 90 days supply | Qty: 3 | Fill #0

## 2018-11-19 MED FILL — CYANOCOBALAMIN (VIT B-12) 1,000 MCG/ML INJECTION SOLUTION: 90 days supply | Qty: 3 | Fill #0 | Status: AC

## 2018-11-20 ENCOUNTER — Other Ambulatory Visit: Admit: 2018-11-20 | Discharge: 2018-11-20 | Payer: MEDICAID

## 2018-11-20 ENCOUNTER — Ambulatory Visit: Admit: 2018-11-20 | Discharge: 2018-11-20 | Payer: MEDICAID | Attending: Gastroenterology | Primary: Gastroenterology

## 2018-11-20 DIAGNOSIS — K50819 Crohn's disease of both small and large intestine with unspecified complications: Principal | ICD-10-CM

## 2018-11-20 LAB — COMPREHENSIVE METABOLIC PANEL
ALBUMIN: 3.6 g/dL (ref 3.5–5.0)
ALKALINE PHOSPHATASE: 108 U/L (ref 38–126)
ALKALINE PHOSPHATASE: 108 U/L — ABNORMAL HIGH (ref 38–126)
ALT (SGPT): 20 U/L (ref <35)
ANION GAP: 14 mmol/L (ref 7–15)
AST (SGOT): 20 U/L (ref 14–38)
BILIRUBIN TOTAL: 0.2 mg/dL (ref 0.0–1.2)
BLOOD UREA NITROGEN: 16 mg/dL (ref 7–21)
BUN / CREAT RATIO: 18
CALCIUM: 9.7 mg/dL (ref 8.5–10.2)
CHLORIDE: 113 mmol/L — ABNORMAL HIGH (ref 98–107)
CO2: 15 mmol/L — ABNORMAL LOW (ref 22.0–30.0)
CREATININE: 0.89 mg/dL (ref 0.60–1.00)
EGFR CKD-EPI AA FEMALE: >90 mL/min/{1.73_m2} (ref >=60)
EGFR CKD-EPI NON-AA FEMALE: 83 mL/min/{1.73_m2} (ref >=60)
GLUCOSE RANDOM: 89 mg/dL (ref 70–179)
POTASSIUM: 4.4 mmol/L (ref 3.5–5.0)
PROTEIN TOTAL: 6.5 g/dL (ref 6.5–8.3)

## 2018-11-20 LAB — CBC
HEMOGLOBIN: 12.7 g/dL (ref 12.0–16.0)
MEAN CORPUSCULAR HEMOGLOBIN CONC: 31.1 g/dL (ref 31.0–37.0)
MEAN CORPUSCULAR HEMOGLOBIN: 28.5 pg (ref 26.0–34.0)
MEAN CORPUSCULAR VOLUME: 91.4 fL (ref 80.0–100.0)
MEAN PLATELET VOLUME: 9 fL (ref 7.0–10.0)
PLATELET COUNT: 549 10*9/L — ABNORMAL HIGH (ref 150–440)
RED BLOOD CELL COUNT: 4.47 10*12/L (ref 4.00–5.20)
WBC ADJUSTED: 17.1 10*9/L — ABNORMAL HIGH (ref 4.5–11.0)

## 2018-11-20 NOTE — Unmapped (Signed)
Labs only by [name]

## 2018-11-20 NOTE — Unmapped (Addendum)
1. We will check labs today.    2. I have ordered a CT scan to evaluate for active Crohn's disease.    3. Depending on the results of the CT scan, we may move forward with an EGD and colonoscopy.    4. You can safely use up to 3 to 4 grams (3000 to 4000 mg) of Tylenol per day. I would not increase Cymbalta and using narcotic pain medications could be dangerous, particularly if you have a complication of Crohn's disease.

## 2018-11-20 NOTE — Unmapped (Signed)
[facility] [facility] FOLLOW-UP CLINIC VISIT   INFLAMMATORY BOWEL DISEASE  11/20/2018    Demographics:  Mary Hawkins is a 38 y.o. female with Crohn's disease    Referring physician:   [facility]          HPI / NOTE :     HPI:  The patient most recently had a telephone visit with Dr. [name] on 11/06/18 after a recent hospitalization for seizure-like activity in the setting of a small SAH. During that hospitalization, she was found to have severe hypomagnesemia and Cymbalta was reduced to once daily dosing. She is being seen today for abdominal pain and bloating, as well as recent blood per rectum.    Patient complains of diffuse abdominal pain that is worst on the sides and radiates to her upper and lower back. She describes it as a tightness that she feels tugging when she moves, twists, bends, or lies on her sides. The pain decreases when she remains still. Pain has been present for about the past 3 months and occurs daily. She also reports bloating throughout her whole body, affecting her abdomen, face, and arms the most. Bloating was worst 2 days ago when she sent a message to Dr. [name] and has spontaneously improved (although still present). The bloating has been ongoing for >3 months. She has diffuse muscle cramps but attributes this to fibromyalgia. She does not exercise and has poor sleep habits.     The patient remains on Stelara 90 mc [place] q8 weeks with her last dose on 10/29/18. She typically has between 6-10 large volume watery BMs per day and first noticed some blood in her stool that began 4 days ago. This was associated with a more formed stool, as well as rectal burning and tightness when having a BM and concerned that she had an internal hemorrhoid. She describes bright red blood surrounding the stool and on the toilet paper (total of 3-4 BMs with blood), occurring with more formed stools. Denies prior rectal bleeding. 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 She is taking [place] without perceived benefit and has not had improvement with Entocort in the past.     Of note, she was not able to go to [facility] to have labs ordered by Dr. [name] drawn.    Review of Systems: positive as per HPI   Otherwise, the balance of 10 systems is negative.          IBD HISTORY:     Year of disease onset:  2010  Diagnosis:  Crohn's Disease  Age at onset:   5-40 yr old (A2)  Location:  Ileocol[place] (L3)  Behavior:  Stricturing (B2)  Perianal Dz:  No    Brief IBD Disease Course:    2010 diagnosed with stricturing ileal Crohn's disease, treated 8 months with prednisone. 02/2010 start of Humira. 03/2011 stop Humira due to recurrent skin infections, started Entocort, issues with rectal prolapse and found to have ileal stricture. 11/13/13 ileocecal resection with Dr. Epifania Gore (28 cm of ileum resected, 5 cm colon resected - path c/w Crohn's disease but no granulomas), also with incidental Meckel's diverticulum noted and resected, mesh rectopexy for rectal prolapse. 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 03/18/14 discussed Remicade/Imuran but did not start these medications. 5/1-5/15/19 admission to [facility] health for ARDS 2/2 rhinovirus requiring mechanical ventilation, AKI. 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  02/16/18 IV loading dose of ustekinumab followed by ustekinumab 90 mg [place] every 8 weeks; self-discontinued ustekinumab. 08/2018 repeat IV load ustekinumab.    Endoscopy:      Colon 12/27/17: Mild inflammation and a few ulcerations at ileocolonic anastomosis. Rutgeert's i2. Localized mildly granular mucosa in the sigmoid colon.  - path: Small bowel - patchy crypt architectural distortion with Paneth cell hyperplasia, consistent with mild chronic active enteritis, negative for dysplasia, no granulomas. Colon - unremarkable colonic mucosa.   EGD 12/27/17: Grade D esophagitis, small hiatal hernia, otherwise normal stomach and duodenum.   - path: ulceration with inflamed granulation tissue, no granulomas  Colon 02/20/14: Multiple discrete ulcerations seen corresponding to Rutgeert's i2.   EGD 11/12/13: Normal esophagus. Markedly thickened gastric folds without inflammation seen. Normal duodenum.     Imaging:    CT Head 10/23/18: Resolution of the previously seen right frontal subarachnoid  hemorrhage. No acute intracranial abnormality.    CT Head/CT Cervical Spine 10/20/18: Suggestion of tiny focus of acute subarachnoid hemorrhage in the right posterior frontal convexity. This may represent a tiny surface contusion. Normal alignment of the cervical spine. No acute displaced fractures identified.    CT Abd/Pelvis 08/25/18: Multiple fluid-filled loops of small bowel and fluid throughout the colon as can be seen with enterocolitis. Otherwise no acute abdominal or pelvic pathology. Left ovarian cyst.    CT A/P 12/24/17: Mild wall thickening of the neoterminal ileum, possibly mild ileus. Right lower quadrant adenopathy, probably reactive.    Echo 12/09/17: - Left ventricle: The cavity size was normal. Wall thickness was   ??increased in a pattern of mild LVH. Systolic function was   ??vigorous. The estimated ejection fraction was in the range of 65%   ??to 70%. Doppler parameters are consistent with abnormal left   ??ventricular relaxation (grade 1 diastolic dysfunction).  - Pulmonary arteries: PA peak pressure: 42 mm Hg (S).    Prior IBD medications (type, dose, duration, response):  - prednisone  - Entocort  - adalimumab 2011-2012, good response but developed recurrent skin infections prompting discontinuation  - ustekinumab 02/2018 - 04/2018 before self discontinued, reload 08/2018 and now 90 mg [place] every 8 weeks             Past Medical History:   Past medical history:   Past Medical History:   Diagnosis Date   ??? Anxiety    ??? Arthritis    ??? Back pain    ??? Crohn disease (CMS-HCC)    ??? Eczema ??? Endometriosis    ??? GERD (gastroesophageal reflux disease)    ??? Headache(784.0)    ??? Hypertension    ??? Ulcerative colitis (CMS-HCC)      Past surgical history:   Past Surgical History:   Procedure Laterality Date   ??? BREAST RECONSTRUCTION  2004   ??? ENDOMETRIAL ABLATION     ??? PR COLONOSCOPY FLX DX W/COLLJ SPEC WHEN PFRMD N/A 11/12/2013    Procedure: COLONOSCOPY, FLEXIBLE, PROXIMAL TO SPLENIC FLEXURE; DIAGNOSTIC, W/WO COLLECTION SPECIMEN BY BRUSH OR WASH;  Surgeon: Teodoro Spray, MD;  Location: GI [facility]URES [facility];  Service: Gastroenterology   ??? PR COLONOSCOPY FLX DX W/COLLJ SPEC WHEN PFRMD N/A 02/20/2014    Procedure: COLONOSCOPY, FLEXIBLE, PROXIMAL TO SPLENIC FLEXURE; DIAGNOSTIC, W/WO COLLECTION SPECIMEN BY BRUSH OR WASH;  Surgeon: Billie Ruddy, MD;  Location: GI [facility]URES [facility];  Service: Gastroenterology   ??? PR COLONOSCOPY FLX DX W/COLLJ SPEC WHEN PFRMD N/A 12/27/2017    Procedure: COLONOSCOPY, FLEXIBLE, PROXIMAL  TO SPLENIC FLEXURE; DIAGNOSTIC, W/WO COLLECTION SPECIMEN BY BRUSH OR WASH;  Surgeon: Bronson Curb, MD;  Location: GI PROC[facility]URES [facility];  Service: Gastroenterology   ??? PR REMVL COLON & TERM ILEUM W/ILEOCOLOSTOMY N/A 11/13/2013    Procedure: Ileocecectomy, meckel's diverticulectomy, mesh rectopexy;  Surgeon: Emilia Beck, MD;  Location: MAIN OR [place];  Service: Gastrointestinal   ??? PR UPPER GI ENDOSCOPY,BIOPSY N/A 11/12/2013    Procedure: UGI ENDOSCOPY; WITH BIOPSY, SINGLE OR MULTIPLE;  Surgeon: Teodoro Spray, MD;  Location: GI [facility];  Service: Gastroenterology   ??? PR UPPER GI ENDOSCOPY,BIOPSY N/A 12/27/2017    Procedure: UGI ENDOSCOPY; WITH BIOPSY, SINGLE OR MULTIPLE;  Surgeon: Bronson Curb, MD;  Location: GI [facility]URES [facility];  Service: Gastroenterology   ??? RECTAL PROLAPSE REPAIR       Family history:   Family History   Problem Relation Age of Onset   ??? Esophageal cancer Maternal Grandfather    ??? Cancer Maternal Grandfather         Gastric cancer Social history: Single - has son. She is currently smoking 1/2 ppd (down from 1 ppd) - she had quit in the hospital but started again. Occasional alcohol use. No illicit drug use.   Social History     Socioeconomic History   ??? Marital status: Single     Spouse name: Not on file   ??? Number of children: Not on file   ??? Years of education: Not on file   ??? Highest education level: Not on file   Occupational History   ??? Not on file   Social Needs   ??? Financial resource strain: Not on file   ??? Food insecurity     Worry: Not on file     Inability: Not on file   ??? Transportation needs     Medical: Not on file     Non-medical: Not on file   Tobacco Use   ??? Smoking status: Current Every Day Smoker     Packs/day: 1.00     Types: Cigarettes     Last attempt to quit: 12/06/2017     Years since quitting: 0.9   ??? Smokeless tobacco: Never Used   Substance and Sexual Activity   ??? Alcohol use: Yes     Comment: socially   ??? Drug use: No   ??? Sexual activity: Not on file   Lifestyle   ??? Physical activity     Days per week: Not on file     Minutes per session: Not on file   ??? Stress: Not on file   Relationships   ??? Social Wellsite geologist on phone: Not on file     Gets together: Not on file     Attends religious service: Not on file     Active member of club or organization: Not on file     Attends meetings of clubs or organizations: Not on file     Relationship status: Not on file   Other Topics Concern   ??? Not on file   Social History Narrative    Currently unemployed.  She is a 36-year-old son which takes care of.             Allergies:     Allergies   Allergen Reactions   ??? Penicillins Hives     Has patient had a PCN reaction causing immediate rash, facial/tongue/throat swelling, SOB or lightheadedness with hypotension: Yes  Has patient  had a PCN reaction causing severe rash involving mucus membranes or skin necrosis: Unk  Has patient had a PCN reaction that required hospitalization: Yes  Has patient had a PCN reaction occurring within the last 10 years: No  If all of the above answers are NO, then may proceed with Cephalosporin use.  Has patient had a PCN reaction causing immediate rash, facial/tongue/throat swelling, SOB or lightheadedness with hypotension: Yes  Has patient had a PCN reaction causing severe rash involving mucus membranes or skin necrosis: Unk  Has patient had a PCN reaction that required hospitalization: Yes  Has patient had a PCN reaction occurring within the last 10 years: No  If all of the above answers are NO, then may proceed with Cephalosporin use.   ??? Doxycycline Hcl Other (See Comments)     Reaction not recalled             Medications:     Current Outpatient Medications   Medication Sig Dispense Refill   ??? acetaminophen (TYLENOL) 500 MG tablet Take 1,000 mg by mouth every six (6) hours as needed for pain.     ??? cholestyramine (QUESTRAN) 4 gram packet Take 1 packet (4 g total) by mouth two (2) times a day. Separate from other medications by 1-2 hours. 60 packet 5   ??? cyanocobalamin 1,000 mcg/mL injection Inject 1 mL (1,000 mcg total) under the skin every thirty (30) days. 4 mL 2   ??? diphenoxylate-atropine (LOMOTIL) 2.5-0.025 mg per tablet Take 1 tablet by mouth Three (3) times a day as needed for diarrhea. 60 tablet 1   ??? DULoxetine (CYMBALTA) 60 MG capsule Take 1 capsule (60 mg total) by mouth daily. 30 capsule 11   ??? empty container Misc Use as directed 1 each PRN   ??? ergocalciferol (DRISDOL) 50,000 unit capsule Take 1 capsule (50,000 Units total) by mouth once a week. 8 capsule 0   ??? ergocalciferol (VITAMIN D2) 1,250 mcg (50,000 unit) capsule TAKE 1 CAPSULE BY MOUTH ONCE A WEEK 4 capsule 0   ??? famotidine (PEPCID) 20 MG tablet Take 1 tablet (20 mg total) by mouth Two (2) times a day. 180 tablet 3   ??? hydroxyzine (ATARAX) 50 mg capsule/tablet Take 1 each (50 mg total) by mouth Three (3) times a day as needed. 90 tablet 5   ??? magnesium oxide (MAG-OX) 400 mg (241.3 mg magnesium) tablet Take 2 tablets (800 mg total) by mouth Two (2) times a day. 120 tablet 5   ??? mirtazapine (REMERON) 30 MG tablet Take 1 tablet (30 mg total) by mouth nightly. 30 tablet 5   ??? ondansetron (ZOFRAN-ODT) 8 MG disintegrating tablet Dissolve 1 tablet (8 mg total) on the tongue every eight (8) hours as needed for nausea. 60 tablet 1   ??? potassium chloride (KLOR-CON) 20 MEQ CR tablet Take 1 tablet (20 mEq total) by mouth daily. 30 tablet 5   ??? potassium chloride SA (K-DUR,KLOR-CON) 20 MEQ tablet Take 1 tablet (20 mEq total) by mouth Two (2) times a day. (Patient taking differently: Take 20 mEq by mouth daily. ) 60 tablet 2   ??? syringe with needle, safety (BD SAFETY-LOK DETACHABLE NEEDL) 3 mL 25 gauge x 5/8 Syrg 1 each by Miscellaneous route every thirty (30) days. 25 each 0   ??? ustekinumab (STELARA) 90 mg/mL Syrg syringe Inject the contents of 1 syringe under the skin every 8 weeks 1 mL 3     No current facility-administered medications for this visit.  Physical Exam:     Vitals:    11/20/18 0854   BP: 149/97   Pulse: 101   Temp: 36.3 ??C   SpO2: 97%     GEN: sitting in chair in no acute distress, appeared mildly anxious when describes pain  NEURO:  gait normal, non-focal, no obvious neurologic abnormality  LUNGS: normal work of breathing  ABD: soft, mildly distended, diffusely tender even to light palpation  Extremities: warm and well-perfused, no peripheral edema  SKIN: no visible lesions on face, neck, arms, abdomen  PERIANAL / RECTAL EXAM: no perianal fistulas or fissures          Labs, Data & Indices:     Lab Review:   Lab Results   Component Value Date    WBC 21.1 (H) 09/09/2018    WBC 8.6 02/20/2014    RBC 4.46 09/09/2018    RBC 4.09 02/20/2014    HGB 12.8 09/09/2018    HGB 11.6 (L) 02/20/2014     Lab Results   Component Value Date    AST 15 09/09/2018    AST 21 02/18/2014    ALT 13 09/09/2018    ALT 15 02/18/2014    BUN 19 09/09/2018    BUN 8 03/18/2014    Creatinine 0.88 09/09/2018    Creatinine 0.86 03/18/2014    CO2 15.0 (L) 09/09/2018    CO2 27 03/18/2014    Albumin 4.1 09/09/2018    Albumin 3.9 03/18/2014    Calcium 9.7 09/09/2018    Calcium 7.3 (L) 03/18/2014     Lab Results   Component Value Date    TSH 3.433 (H) 01/19/2018    TSH 4.50 (H) 03/18/2014      HBsAg neg   HBcAb total neg  HBsAb neg  Quantiferon gold neg  TPMT normal  ............................................................................................................................................Marland Kitchen          Assessment & Recommendations:     Mary Hawkins is a 38 y.o. female with history of stricturing??ileal Crohn's disease??s/p ileocecal resection, mesh retropexy for rectal prolapse, incidental Meckel's diverticulum s/p resection 11/13/13 who is seen today for evaluation of abdominal pain and rectal bleeding. Unclear if her abdominal pain is related to active Crohn's disease, IBS, or fibromyalgia in the setting of recently decreased Cymbalta, lack of exercise, poor sleep, and anxiety. Most recent CT scan in 08/2018 showed active inflammation in the neoterminal ileum and last endoscopic evaluation was in 12/2017. She is now maintained on ustekinumab 90 mg [place] every 8 weeks (restarted in January 2020 after self-discontinuing in September 2019) and unfortunately has started smoking again. We will plan on reassessing disease activity with a CT enterography and will pursue EGD/colonoscopy pending those results. She has not had outpatient labs drawn as ordered by Dr. [name] so we will obtain those today. She has a history of severe hypomagnesemia and remains on K and Mg supplementation.     - Check labs today  - CT enterography   - EGD and colonoscopy pending results of CT enterography  - Continue ustekinumab 90 mg [place] q8 weeks; discussed possibility of needing to increase frequency to q4 weeks if with ongoing disease  - Continue mag oxide 800 mg BID and K 20 meq daily  - Continue questran and will defer decision on clonidine patch and port for IVFs/Mg to Dr. [name]  - Continue famotidine 20 mg BID in place of previous PPI and history of Grade D esophagitis    Patient was seen and discussed with attending, Dr. Gwenith Spitz.  Return to clinic in 3 months or sooner with Dr. [name] as needed.     Justine Null, MD  Gastroenterology Fellow, PGY-6  Pager [phone]  --------------------------------------------  [facility] Gastroenterology & Hepatology  Multidisciplinary Inflammatory Bowel Disease Clinic

## 2018-11-22 NOTE — Unmapped (Signed)
Behavioral Health Care Management            Date of Service:  11/22/2018        Date of Last Encounter:  11/15/2018    Purpose of contact:     [place] [facility] Social Worker contacted the patient as part of the BHCMP- LVM.    Follow Up Plan/Next Steps:    []  Schedule follow up appointment with GI Physician  []  Provide referral/linkage information:  []  Consult with GI Physician, re: care plan and patient's treatment progress  [x]  Other:  f/u mood, administer PHQ9/GAD7      Additional Information/Plan:  Pt was provided with my direct office phone number should additional needs arise.    No future appointments.    Ottie Glazier, MSW, M.Ed, LCSWA  Social Worker- MSW  [facility]  [phone]

## 2018-12-03 MED ORDER — DULOXETINE 60 MG CAPSULE,DELAYED RELEASE
ORAL_CAPSULE | Freq: Every day | ORAL | 11 refills | 30.00000 days | Status: CP
Start: 2018-12-03 — End: 2019-03-12
  Filled 2018-12-04: qty 30, 30d supply, fill #0

## 2018-12-04 MED FILL — HYDROXYZINE HCL 50 MG TABLET: ORAL | 30 days supply | Qty: 90 | Fill #1

## 2018-12-04 MED FILL — ONDANSETRON 8 MG DISINTEGRATING TABLET: ORAL | 20 days supply | Qty: 60 | Fill #1

## 2018-12-04 MED FILL — DULOXETINE 60 MG CAPSULE,DELAYED RELEASE: 30 days supply | Qty: 30 | Fill #0 | Status: AC

## 2018-12-04 MED FILL — ONDANSETRON 8 MG DISINTEGRATING TABLET: 20 days supply | Qty: 60 | Fill #1 | Status: AC

## 2018-12-04 MED FILL — FAMOTIDINE 20 MG TABLET: 30 days supply | Qty: 60 | Fill #1 | Status: AC

## 2018-12-04 MED FILL — HYDROXYZINE HCL 50 MG TABLET: 30 days supply | Qty: 90 | Fill #1 | Status: AC

## 2018-12-04 MED FILL — FAMOTIDINE 20 MG TABLET: ORAL | 30 days supply | Qty: 60 | Fill #1

## 2018-12-04 MED FILL — MIRTAZAPINE 30 MG TABLET: ORAL | 30 days supply | Qty: 30 | Fill #1

## 2018-12-04 MED FILL — MIRTAZAPINE 30 MG TABLET: 30 days supply | Qty: 30 | Fill #1 | Status: AC

## 2018-12-06 NOTE — Unmapped (Signed)
Behavioral Health Care Management          Date of Service:  12/06/2018        Date of Last Encounter:  11/28/2018    Purpose of contact:     GI Social Worker contacted the patient as part of the BHCMP.   Patient has a Depression Composite Score of 6      Mental Status/Behavioral Observations  Affect:  Anxious and Depressed   Mood:   Depressed and Anxious   Thought Process:  Logical, linear, clear, coherent, goal directed   Behavior:   Calm and Polite   Self Harm: 1. not present       Services provided:   []  Behavioral Health Care assessment   []  Behavioral Health Care plan developed collaboratively with patient to be reviewed by GI Physician  [x]  Care Management contact with patient addressing care plan goals     []  Coordination/Facilitation of care with other providers  []  PCP, [facility]  []  GI Physician, Dr. [name]  []  Consulting Psychiatrist, Dr. Lucienne Capers  [x]  Treatment progress monitoring/review:   [x]  Administered PHQ-9/GAD-7 or other applicable validated rating scale   [x]  Reviewed results of recent PHQ-9/GAD -7 with patient   []  Patient making progress- no revision to care plan needed   [x]  Revisions to care plan: pt referred to [facility] for continued medication management      Specific Interventions with patient during this encounter:   []  Not applicable  [x]  Problem Solving Skills  [x]  Behavioral Activation  []  Brief Distress Tolerance Skill building  []  Mindfulness based stress management skills  []  Non-pharmacological approaches to improving sleep  []  Assist in development of a Crisis Management plan  [x]  Motivational Interviewing  []  Brief CBT   [x]  Other: supportive counseling and case management    Progress/Outcome:    PHQ-9 PHQ-9 TOTAL SCORE   12/06/2018 18   10/10/2018 17   08/14/2018 13        GAD7 Total Score GAD-7 Total Score   12/06/2018 21   10/10/2018 21   08/14/2018 11         SCDAI: 450    Pt reported that they are currently taking Cymbalta 60 mg, Remeron 30 mg, and Atarax 50 mg TID. She reports feeling no difference in terms of mood and specifically does not feel relief with daily anxiety symptoms of panic attacks and generalized worry. She would like to increase her Cymbalta to further target issues with pain and depressed mood- currently has an active referral to [facility]. She has been continuing to have issues staying asleep and has been tracking her sleep with her Fitbit, which shows that she is still waking up multiple times throughout the night and having difficulty falling back asleep. She is also experiencing pain in her legs and feet while laying in bed, similar to symptoms of RLS, which she has been taking either Aspirin or Ibuprofen with little relief. 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 She reports that her memory has increasingly gotten worse since last May while she was on life support and again after recently having a seizure. She describes experiencing blankers, where she forgets words and has difficulty expressing herself and communicating clearly. This has also contributed to her anxiety and depressive  symptoms because she would ideally like to go back to continue her education and function. Provided emotional support in the form of empathetic listening, supportive counseling and validating her experience and current emotional state. Helped her name her name emotions, such as feeling frustrated for needing another CT scan, to better her understanding of emotional regulation. Discussed problem solving skills that she can utilize to accomplish daily tasks and work towards her goal of a better general well being. Her current behavioral activation includes walking outside to improve her mood and to feel better about herself. She is concerned with her weight gain and feels bloated and puffy despite attempts to eat well and will await results from next endoscopy and colonoscopy for possible explanations. Will f/u with pt to help connect with needed resources- PCP/dentist and will continue to support as appropriate.     Follow Up Plan/Next Steps:    []  Schedule follow up appointment with GI Physician  [x]  Provide referral/linkage information: pt needs to establish care with local, in-network PCP and dentist/ new referral for [facility] needed  [x]  Consult with GI Physician, re: care plan and patient's treatment progress  []  Other:        Additional Information/Plan:  Pt was provided with my direct office phone number should additional needs arise.    No future appointments.    Ottie Glazier, MSW, M.Ed, LCSWA  Social Worker- [facility]  [phone]

## 2018-12-07 NOTE — Unmapped (Signed)
A minimum of 20 minutes of integrated behavioral health services have been provided during this calendar month. See previous patient outreach encounter notes for details of services and durations.

## 2018-12-13 NOTE — Unmapped (Signed)
[facility] Clinical Assessment & Refill Coordination Note    Mary Hawkins, DOB: [birth year]  Phone: [phone] (home) [phone] (work)    All above HIPAA information was verified with patient.     Specialty Medication(s):   Inflammatory Disorders: Stelara     Current Outpatient Medications   Medication Sig Dispense Refill   ??? acetaminophen (TYLENOL) 500 MG tablet Take 1,000 mg by mouth every six (6) hours as needed for pain.     ??? cholestyramine (QUESTRAN) 4 gram packet Take 1 packet (4 g total) by mouth two (2) times a day. Separate from other medications by 1-2 hours. 60 packet 5   ??? cyanocobalamin 1,000 mcg/mL injection Inject 1 mL (1,000 mcg total) under the skin every thirty (30) days. 4 mL 2   ??? diphenoxylate-atropine (LOMOTIL) 2.5-0.025 mg per tablet Take 1 tablet by mouth Three (3) times a day as needed for diarrhea. 60 tablet 1   ??? DULoxetine (CYMBALTA) 60 MG capsule Take 1 capsule (60 mg total) by mouth daily. 30 capsule 11   ??? empty container Misc Use as directed 1 each PRN   ??? ergocalciferol (DRISDOL) 50,000 unit capsule Take 1 capsule (50,000 Units total) by mouth once a week. 8 capsule 0   ??? ergocalciferol (VITAMIN D2) 1,250 mcg (50,000 unit) capsule TAKE 1 CAPSULE BY MOUTH ONCE A WEEK 4 capsule 0   ??? famotidine (PEPCID) 20 MG tablet Take 1 tablet (20 mg total) by mouth Two (2) times a day. 180 tablet 3   ??? hydroxyzine (ATARAX) 50 mg capsule/tablet Take 1 each (50 mg total) by mouth Three (3) times a day as needed. 90 tablet 5   ??? magnesium oxide (MAG-OX) 400 mg (241.3 mg magnesium) tablet Take 2 tablets (800 mg total) by mouth Two (2) times a day. 120 tablet 5   ??? mirtazapine (REMERON) 30 MG tablet Take 1 tablet (30 mg total) by mouth nightly. 30 tablet 5   ??? ondansetron (ZOFRAN-ODT) 8 MG disintegrating tablet Dissolve 1 tablet (8 mg total) on the tongue every eight (8) hours as needed for nausea. 60 tablet 1   ??? potassium chloride (KLOR-CON) 20 MEQ CR tablet Take 1 tablet (20 mEq total) by mouth daily. 30 tablet 5   ??? potassium chloride SA (K-DUR,KLOR-CON) 20 MEQ tablet Take 1 tablet (20 mEq total) by mouth Two (2) times a day. (Patient taking differently: Take 20 mEq by mouth daily. ) 60 tablet 2   ??? syringe with needle, safety (BD SAFETY-LOK DETACHABLE NEEDL) 3 mL 25 gauge x 5/8 Syrg 1 each by Miscellaneous route every thirty (30) days. 25 each 0   ??? ustekinumab (STELARA) 90 mg/mL Syrg syringe Inject the contents of 1 syringe under the skin every 8 weeks 1 mL 3     No current facility-administered medications for this visit.         Changes to medications: Mary Hawkins reports no changes at this time.    Allergies   Allergen Reactions   ??? Penicillins Hives     Has patient had a PCN reaction causing immediate rash, facial/tongue/throat swelling, SOB or lightheadedness with hypotension: Yes  Has patient had a PCN reaction causing severe rash involving mucus membranes or skin necrosis: Unk  Has patient had a PCN reaction that required hospitalization: Yes  Has patient had a PCN reaction occurring within the last 10 years: No  If all of the above answers are NO, then may proceed with Cephalosporin use.  Has  patient had a PCN reaction causing immediate rash, facial/tongue/throat swelling, SOB or lightheadedness with hypotension: Yes  Has patient had a PCN reaction causing severe rash involving mucus membranes or skin necrosis: Unk  Has patient had a PCN reaction that required hospitalization: Yes  Has patient had a PCN reaction occurring within the last 10 years: No  If all of the above answers are NO, then may proceed with Cephalosporin use.   ??? Doxycycline Hcl Other (See Comments)     Reaction not recalled       Changes to allergies: No    SPECIALTY MEDICATION ADHERENCE     Stelara 90 mg/ml: 0 days of medicine on hand       Specialty medication(s) dose(s) confirmed: Regimen is correct and unchanged.     Are there any concerns with adherence? No    Adherence counseling provided? Not needed    CLINICAL MANAGEMENT AND INTERVENTION      Clinical Benefit Assessment:    Do you feel the medicine is effective or helping your condition? Yes    Clinical Benefit counseling provided? Progress note from 11/06/18 shows evidence of clinical benefit    Adverse Effects Assessment:    Are you experiencing any side effects? No    Are you experiencing difficulty administering your medicine? No    Quality of Life Assessment:    How many days over the past month did your Crohn's disease  keep you from your normal activities? For example, brushing your teeth or getting up in the morning. 0    Have you discussed this with your provider? Not needed    Therapy Appropriateness:    Is therapy appropriate? Yes, therapy is appropriate and should be continued    DISEASE/MEDICATION-SPECIFIC INFORMATION      For patients on injectable medications: Patient currently has 0 doses left.  Next injection is scheduled for 12/25/18.    PATIENT SPECIFIC NEEDS     ? Does the patient have any physical, cognitive, or cultural barriers? No    ? Is the patient high risk? No     ? Does the patient require a Care Management Plan? No     ? Does the patient require physician intervention or other additional services (i.e. nutrition, smoking cessation, social work)? No      SHIPPING     Specialty Medication(s) to be Shipped:   Inflammatory Disorders: Stelara    Other medication(s) to be shipped: none     Changes to insurance: No    Delivery Scheduled: Yes, Expected medication delivery date: 12/20/18.     Medication will be delivered via UPS to the confirmed home address in [facility] [facility].    The patient will receive a drug information handout for each medication shipped and additional FDA Medication Guides as required.  Verified that patient has previously received a Conservation officer, historic buildings.    Breck Coons Shared [facility] Specialty Pharmacist

## 2018-12-13 NOTE — Unmapped (Signed)
Behavioral Health Collaborative Care Management Psychiatric Consultation    Consultation with [facility] Manager in regards to patient's behavioral health needs. Reviewed [facility] care plan and discussed patient treatment progress.    38 year old with a history of Crohn's disease and chronic pain, as well as depression and anxiety.  Recently reported anxiety attacks.  Most recent recommendations were to increase Mirtazapine to 30 mg qHS, increase Hydroxyzine dose to 50 mg and continue Duloxetine.    Patient reports that she is still taking Duloxetine and Mirtazapine, as well as Hydroxyzine.  She does not note any change in her mood or anxiety.  Sleep continues to be difficult; pain is contributing to this.  She reports pain/restless sensation in her legs on sleep initiation.  Review of chart shows that she has been on Gabapentin in the past and recent recommendation to start Pregabalin in Care Everywhere, but unclear if she is taking this.  She reports difficulty with memory and thinking since seizure a few months ago.  She has an appointment with [facility] Psychiat[facility] on 5/18.    PHQ-9 PHQ-9 TOTAL SCORE   12/06/2018 18   10/10/2018 17   08/14/2018 13     GAD7 Total Score GAD-7 Total Score   12/06/2018 21   10/10/2018 21   08/14/2018 11         No revision to care plan      Care manager to clarify with the patient if she is taking Pregabalin (Lyrica) for pain.  This could help with restless leg symptoms. Also clarify why patient stopped taking Gabapentin in the past.     Continue current medications. Could consider further increase in Mirtazapine to 45 mg qHS, but will wait to get information above.  Could also wait until she is seen by Psychiatry on 5/18.    This case review was conducted with information gathered from chart and from care manager information.  I have not examined the patient directly.  Physician should incorporate recommendations as appropriate given full current clinical picture.

## 2018-12-17 NOTE — Unmapped (Signed)
Script was sent for 60 tabs and 1 refill on 12/04/2018

## 2018-12-19 MED FILL — STELARA 90 MG/ML SUBCUTANEOUS SYRINGE: SUBCUTANEOUS | 34 days supply | Qty: 1 | Fill #1

## 2018-12-19 MED FILL — STELARA 90 MG/ML SUBCUTANEOUS SYRINGE: 34 days supply | Qty: 1 | Fill #1 | Status: AC

## 2018-12-19 NOTE — Unmapped (Signed)
Behavioral Health Collaborative Care Management Psychiatric Consultation    Consultation with [facility] Manager in regards to patient's behavioral health needs. Reviewed [facility] care plan and discussed patient treatment progress.    38 year old with a history of Crohn's disease and chronic pain, as well as depression and anxiety.????Recently reported anxiety attacks. ??Most recent recommendations were to increase Mirtazapine to 30 mg qHS, increase Hydroxyzine dose to 50 mg and continue Duloxetine.      Care manager inquired with patient regarding prior trials of gabapentin and pregabalin.  Patient reported that she remembers taking gabapentin and pregabalin previously and believes she stopped due to lack of efficacy.  She has an upcoming psychiatry appointment to establish care.    PHQ-9 PHQ-9 TOTAL SCORE   12/06/2018 18   10/10/2018 17   08/14/2018 13     GAD7 Total Score GAD-7 Total Score   12/06/2018 21   10/10/2018 21   08/14/2018 11         No revision to care plan      No change with upcoming psychiatry appointment.  Patient really would be better served with established psychiatrist.  If she attends her appointment with psychiatry, will likely disenroll from our program.  She can be re-enrolled later if necessary or once more stable from a psychiatric perspective.    This case review was conducted with information gathered from chart and from care manager information.  I have not examined the patient directly.  Physician should incorporate recommendations as appropriate given full current clinical picture.

## 2018-12-25 ENCOUNTER — Other Ambulatory Visit: Payer: Self-pay

## 2018-12-25 ENCOUNTER — Telehealth: Payer: Self-pay | Admitting: Neurology

## 2018-12-25 ENCOUNTER — Ambulatory Visit: Payer: Medicaid Other | Admitting: Neurology

## 2018-12-25 NOTE — Telephone Encounter (Signed)
This patient canceled the same day of a new patient appointment.

## 2018-12-25 NOTE — Telephone Encounter (Signed)
Called pt to check her in for her VV. Pt states she is unable to have a VV and would like a call back to r/s a face to face.

## 2018-12-25 NOTE — Telephone Encounter (Signed)
I contacted the pt and left her a vm asking her to call me back.  Patient was provided with [facility] #. When pt calls back please offer face to face visit if she clears the covid-19 screening process.

## 2019-01-01 MED ORDER — ONDANSETRON 8 MG DISINTEGRATING TABLET
ORAL_TABLET | Freq: Three times a day (TID) | ORAL | 1 refills | 0.00000 days | Status: CP | PRN
Start: 2019-01-01 — End: 2019-02-18
  Filled 2019-01-01: qty 60, 20d supply, fill #0

## 2019-01-01 MED FILL — HYDROXYZINE HCL 50 MG TABLET: 30 days supply | Qty: 90 | Fill #2 | Status: AC

## 2019-01-01 MED FILL — ONDANSETRON 8 MG DISINTEGRATING TABLET: 20 days supply | Qty: 60 | Fill #0 | Status: AC

## 2019-01-01 MED FILL — HYDROXYZINE HCL 50 MG TABLET: ORAL | 30 days supply | Qty: 90 | Fill #2

## 2019-01-01 MED FILL — DULOXETINE 60 MG CAPSULE,DELAYED RELEASE: ORAL | 30 days supply | Qty: 30 | Fill #1

## 2019-01-01 MED FILL — BD LUER-LOK SYRINGE 3 ML 25 X 5/8": 28 days supply | Qty: 25 | Fill #0 | Status: AC

## 2019-01-01 MED FILL — DULOXETINE 60 MG CAPSULE,DELAYED RELEASE: 30 days supply | Qty: 30 | Fill #1 | Status: AC

## 2019-01-01 MED FILL — DIPHENOXYLATE-ATROPINE 2.5 MG-0.025 MG TABLET: 20 days supply | Qty: 60 | Fill #1 | Status: AC

## 2019-01-02 MED ORDER — DIPHENOXYLATE-ATROPINE 2.5 MG-0.025 MG TABLET
ORAL_TABLET | Freq: Three times a day (TID) | ORAL | 1 refills | 20.00000 days | Status: CP | PRN
Start: 2019-01-02 — End: 2019-03-30
  Filled 2019-01-01: qty 60, 20d supply, fill #1
  Filled 2019-02-05: qty 60, 20d supply, fill #0

## 2019-01-02 MED ORDER — BD LUER-LOK SYRINGE 3 ML 25 X 5/8"
0 refills | 0 days | Status: CP
Start: 2019-01-02 — End: ?

## 2019-01-02 MED FILL — MIRTAZAPINE 30 MG TABLET: 30 days supply | Qty: 30 | Fill #2 | Status: AC

## 2019-01-02 MED FILL — BD LUER-LOK SYRINGE 3 ML 25 X 5/8": 90 days supply | Qty: 3 | Fill #0 | Status: AC

## 2019-01-02 MED FILL — MIRTAZAPINE 30 MG TABLET: ORAL | 30 days supply | Qty: 30 | Fill #2

## 2019-01-03 NOTE — Unmapped (Signed)
Behavioral Health Care Management            Date of Service:  01/03/2019        Date of Last Encounter:  12/24/2018    Purpose of contact:     [place] [facility] Social Worker contacted the patient as part of the BHCMP- LVM.    Follow Up Plan/Next Steps:    []  Schedule follow up appointment with GI Physician  []  Provide referral/linkage information:  []  Consult with GI Physician, re: care plan and patient's treatment progress  [x]  Other: f/u mood, administer PHQ9/GAD7      Additional Information/Plan:  Pt was provided with my direct office phone number should additional needs arise.    No future appointments.    Ottie Glazier, MSW, M.Ed, LCSWA  Social Worker- MSW  [facility]  [phone]

## 2019-01-04 NOTE — Unmapped (Signed)
A minimum of 20 minutes of integrated behavioral health services have been provided during this calendar month. See previous patient outreach encounter notes for details of services and durations.

## 2019-01-08 ENCOUNTER — Institutional Professional Consult (permissible substitution): Admit: 2019-01-08 | Discharge: 2019-01-09 | Payer: MEDICAID | Attending: Internal Medicine | Primary: Internal Medicine

## 2019-01-08 DIAGNOSIS — R1084 Generalized abdominal pain: Secondary | ICD-10-CM

## 2019-01-08 DIAGNOSIS — I10 Essential (primary) hypertension: Secondary | ICD-10-CM

## 2019-01-08 DIAGNOSIS — K50019 Crohn's disease of small intestine with unspecified complications: Secondary | ICD-10-CM

## 2019-01-08 DIAGNOSIS — M7989 Other specified soft tissue disorders: Secondary | ICD-10-CM

## 2019-01-08 NOTE — Unmapped (Addendum)
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    General Consent to Treat (GCT) for non-epic video visits only: Verbal consent    Vitals signs, if any:       Diabetes:  ? Regularly checking blood sugars?: no  o   Hypertension:  ? Have blood pressure cuff at home?: no  ? Regularly checking blood pressure?: no    Allergies reviewed: Yes    Medication reviewed: Yes    Refills needed, if any: yes    Travel Screening: completed    Falls Risk: not applicable    Hark (DV) Screening: completed    HCDM reviewed and updated in Epic:  Completed or Not completed (Reason complete)      HARK Screening  HARK Screening  Within the last year, have you been humiliated or emotionally abused in other ways by your partner or ex-partner?: No  Within the last year, have you been afraid of your partner or ex-partner?: No  Within the last year, have you been raped or forced to have any kind of sexual activity by your partner or ex-partner?: No  Within the last year, have you been kicked, hit, slapped, or otherwise physically hurt by your partner or ex-partner?: No      PHQ9  Thoughts that you would be better off dead, or of hurting yourself in some way: Not at all  PHQ-9 TOTAL SCORE: 0    Internal Medicine Phone Visit    This visit is conducted via telephone.    Contact Information  Person Contacted: Patient  Contact Phone number: [phone] (home) [phone] (work)  Is there someone else in the room? No.   Patient agreed to a phone visit    Ms. Bittinger is a 38 y.o. female  participating in a telephone encounter.    Reason for Call  IBD, chronic pain    Subjective:  Patient establishing primary care at [facility], although she lives > 1 hour drive away. Complex PMH with crohn's , had bowel resection in 2015.  Chronic diarrhea and abd pain since then.  Does not take narcotics regularly.  Hx low mag, has not had lab work in a while.  Some discussion of referral to pain management.  Patient is clear that she does have poor quality of life at present.      Using lomotil 1-2 times daily.      > 30 lb wt gain.  Concerned that she may be hypothyroid, has had mildly elevated TSH earlier this year.      I have reviewed the problem list, medications, and allergies and have updated/reconciled them if needed.    Assessment & Plan:  IBD- under care with an injectable biologic, that management will be per GI.    Need to sort out overall care management, as well as her thyroid issues.      Will talk with her GI provider to work on care coordination.     I spent 15 minutes on the phone with the patient. I spent an additional 4 minutes on pre- and post-visit activities.     The patient was physically located in [place] or a state in which I am permitted to provide care.  The patient and/or parent/gauardian understood that s/he may incur co-pays and cost sharing, and agreed to the telemedicine visit. The visit was completed via phone and/or video, which was appropriate and reasonable under the circumstances given the patient's presentation at the time.    The patient and/or parent/guardian has been advised of the potential risks and limitations of this mode of treatment (including, but not limited to, the absence of in-person examination) and has agreed to be treated using telemedicine. The patient's/patient's family's questions regarding telemedicine have been answered.     If the phone/video visit was completed in an ambulatory setting, the patient and/or parent/guardian has also been advised to contact their provider???s office for worsening conditions, and seek emergency medical treatment and/or call 911 if the patient deems either necessary.     Donnita Falls MD MPH

## 2019-01-08 NOTE — Unmapped (Addendum)
1. Will talk with Dr weaver about refills and about referral to pain management    2. You will need periodic lab work, will coordinate with GI    3. It would be helpful to have a in person visit fairly soon in internal medicine to discuss preventive procedures and tests needed in your age group.      4. Will want to check thyroid when we see you in a few weeks.

## 2019-01-08 NOTE — Unmapped (Signed)
Behavioral Health Care Management          Date of Service:  01/08/2019        Date of Last Encounter:  01/07/2019    Purpose of contact:     GI Social Worker contacted the patient as part of the BHCMP.       Services provided:   []  Behavioral Health Care assessment   []  Behavioral Health Care plan developed collaboratively with patient to be reviewed by GI Physician  [x]  Care Management contact with patient addressing care plan goals     [x]  Coordination/Facilitation of care with other providers  []  PCP, [facility]   []  GI Physician, Dr. [name]  []  Consulting Psychiatrist, Dr. Lucienne Capers  []  Treatment progress monitoring/review:   []  Administered PHQ-9/GAD-7 or other applicable validated rating scale   []  Reviewed results of recent PHQ-9/GAD -7 with patient   []  Patient making progress- no revision to care plan needed   []  Revisions to care plan:       Specific Interventions with patient during this encounter:   []  Not applicable  []  Problem Solving Skills  []  Behavioral Activation  []  Brief Distress Tolerance Skill building  []  Mindfulness based stress management skills  []  Non-pharmacological approaches to improving sleep  []  Assist in development of a Crisis Management plan  []  Motivational Interviewing  []  Brief CBT   [x]  Other: coordination of care    Progress/Outcome:    PHQ-9 PHQ-9 TOTAL SCORE   01/08/2019 0   12/06/2018 18   10/10/2018 17   08/14/2018 13        GAD7 Total Score GAD-7 Total Score   12/06/2018 21   10/10/2018 21   08/14/2018 11         Contacted patient to coordinate care/ reschedule new patient appointment with [facility] Psychiat[facility]. Voicemail left on behalf of patient on new patient line [phone]) to schedule telephonic appointment. Patient reported that they were able to establish care with [facility] and that they had their initial telephonic appointment today.  No other needs identified at this time.     Follow Up Plan/Next Steps:    []  Schedule follow up appointment with GI Physician  []  Provide referral/linkage information:  [x]  Consult with GI Physician, re: care plan and patient's treatment progress  []  Other:        Additional Information/Plan:  Pt was provided with my direct office phone number should additional needs arise.    No future appointments.    Ottie Glazier, MSW, M.Ed, LCSWA  Social Worker- [facility]  [phone]

## 2019-01-10 ENCOUNTER — Other Ambulatory Visit: Payer: Self-pay

## 2019-01-10 ENCOUNTER — Ambulatory Visit (INDEPENDENT_AMBULATORY_CARE_PROVIDER_SITE_OTHER): Payer: Medicaid Other

## 2019-01-10 ENCOUNTER — Ambulatory Visit (HOSPITAL_COMMUNITY)
Admission: EM | Admit: 2019-01-10 | Discharge: 2019-01-10 | Disposition: A | Discharge status: Home / Self Care | Payer: Medicaid Other | Attending: Emergency Medicine | Admitting: Emergency Medicine

## 2019-01-10 ENCOUNTER — Encounter (HOSPITAL_COMMUNITY): Payer: Self-pay | Admitting: Emergency Medicine

## 2019-01-10 DIAGNOSIS — M25551 Pain in right hip: Secondary | ICD-10-CM | POA: Diagnosis not present

## 2019-01-10 IMAGING — DX DG HIP (WITH OR WITHOUT PELVIS) 2-3V RIGHT
3 series · 3 of 3 positions shown · non-contrast
Comparison: CT abdomen pelvis dated [DATE].

CLINICAL DATA: Right hip pain.

EXAM:
DG HIP (WITH OR WITHOUT PELVIS) 2-3V RIGHT

[pelvis ap]
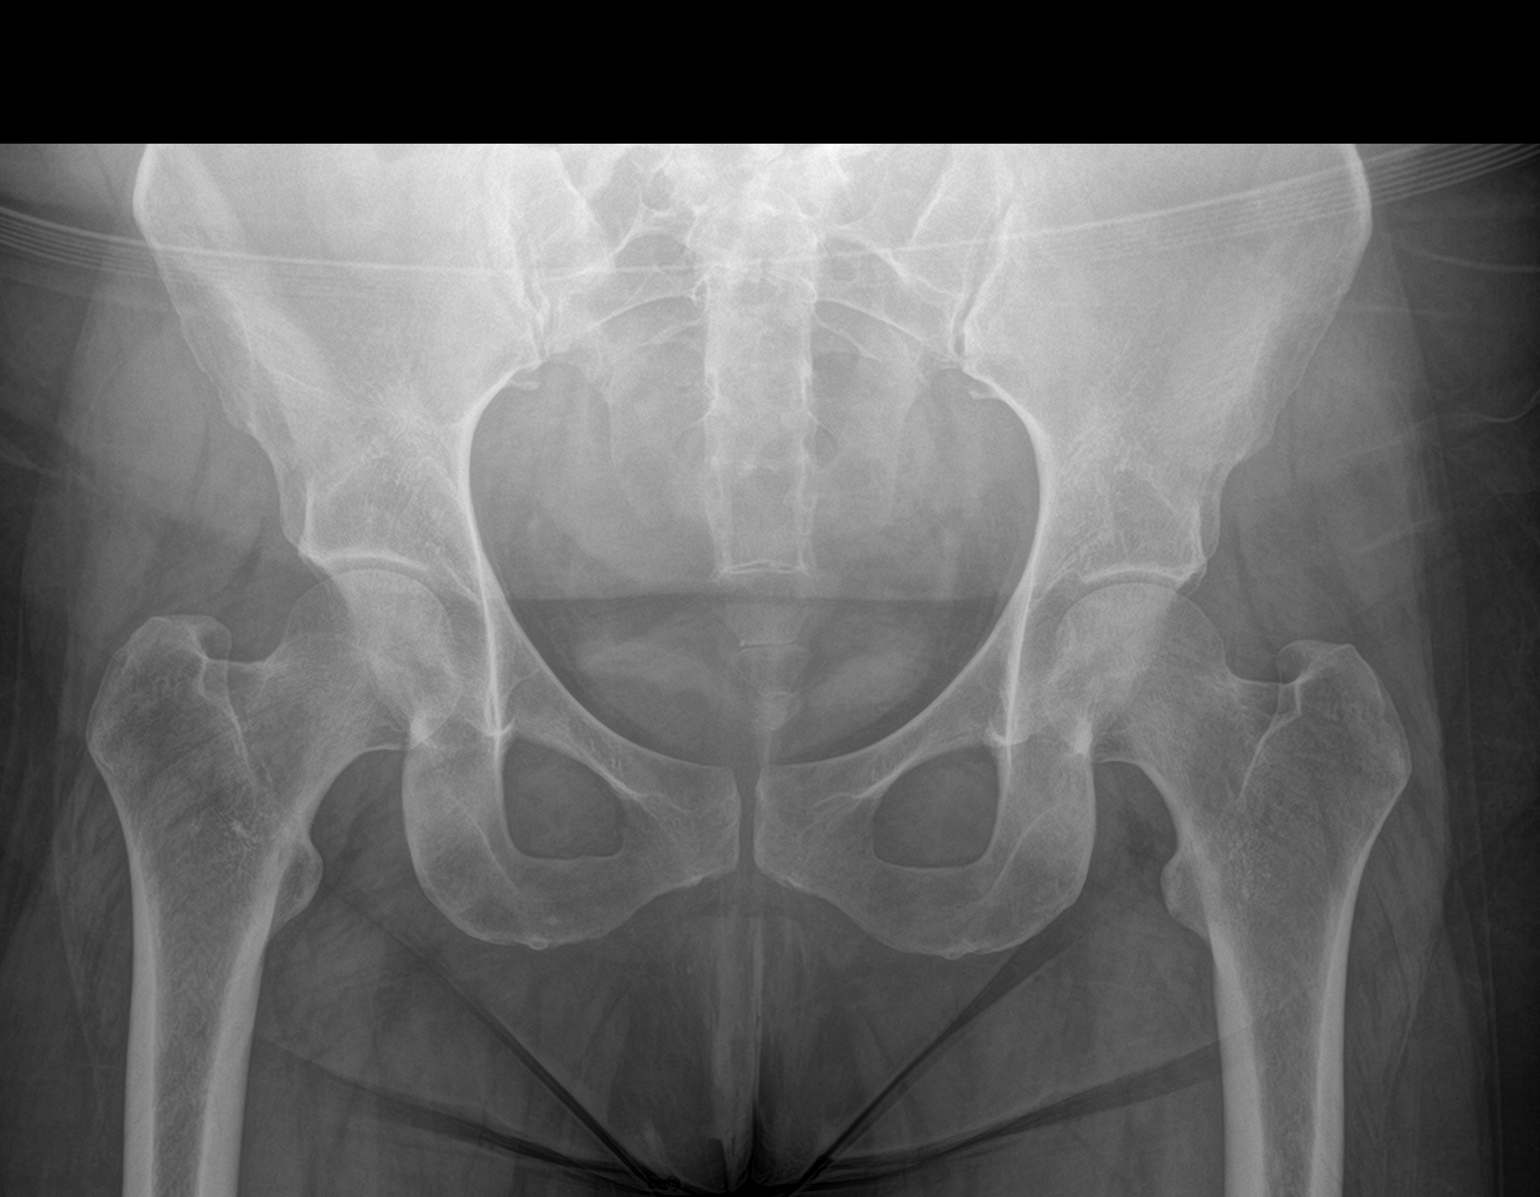

[hip ap]
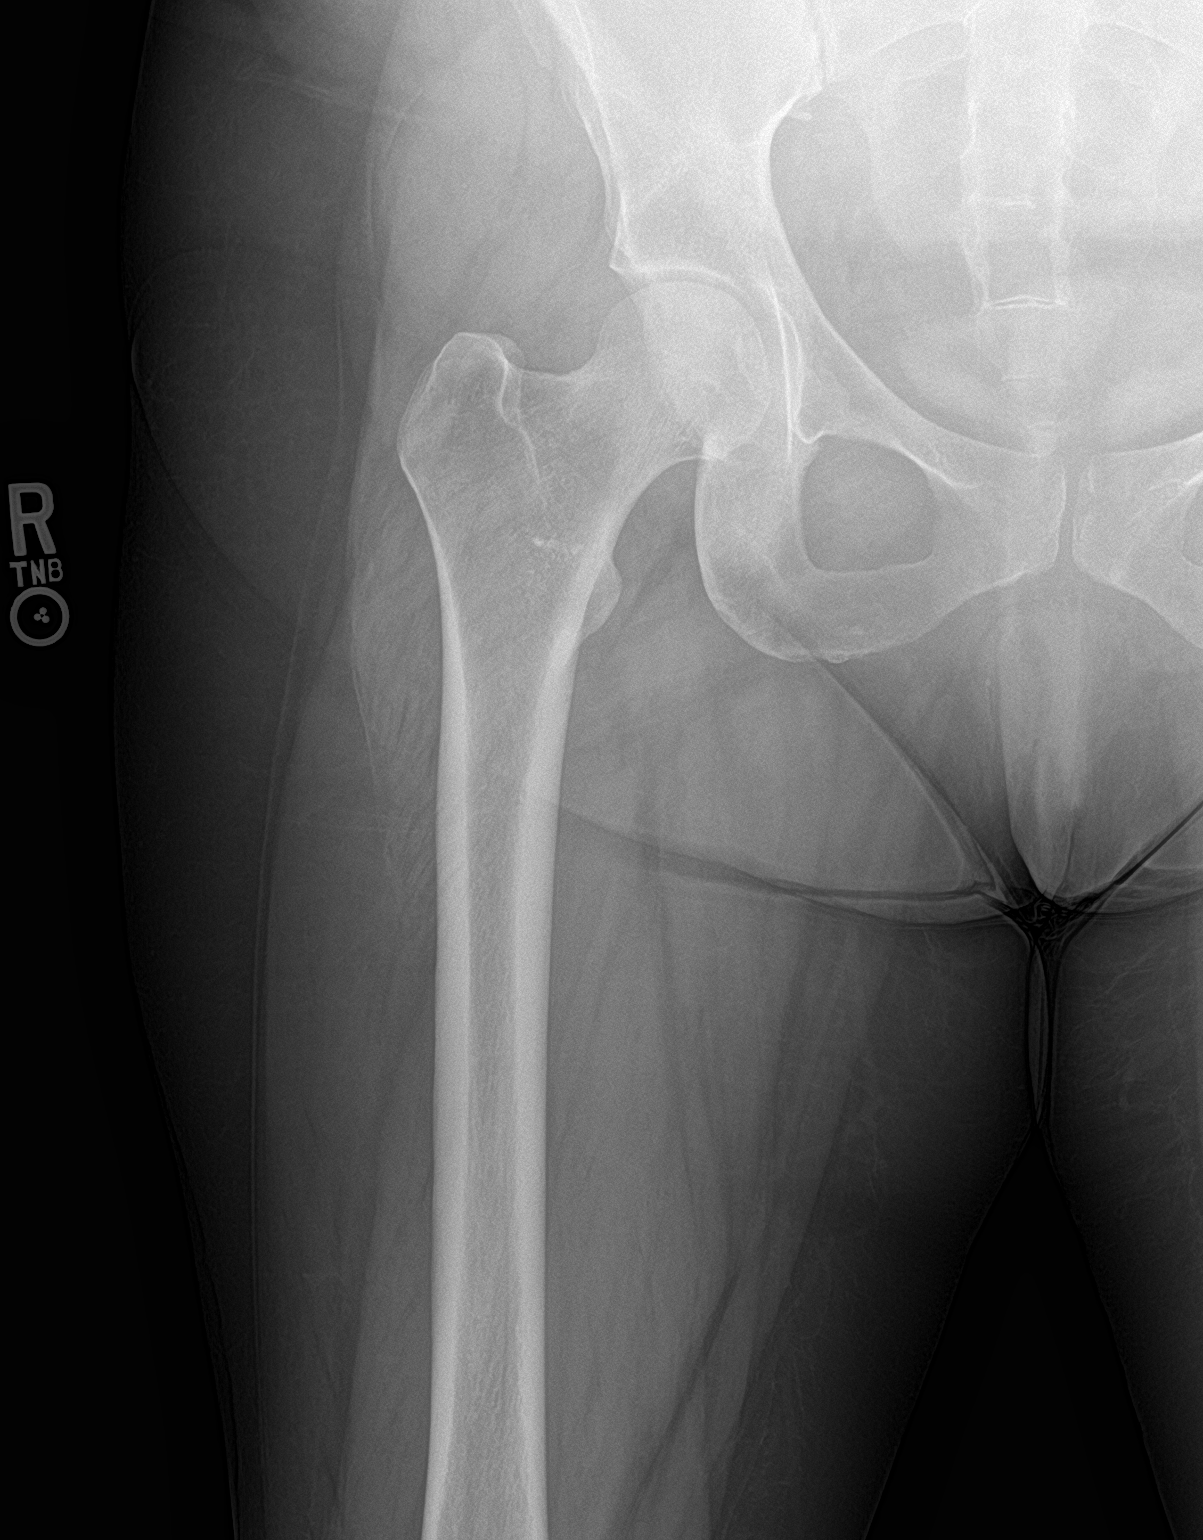

[hip lat]
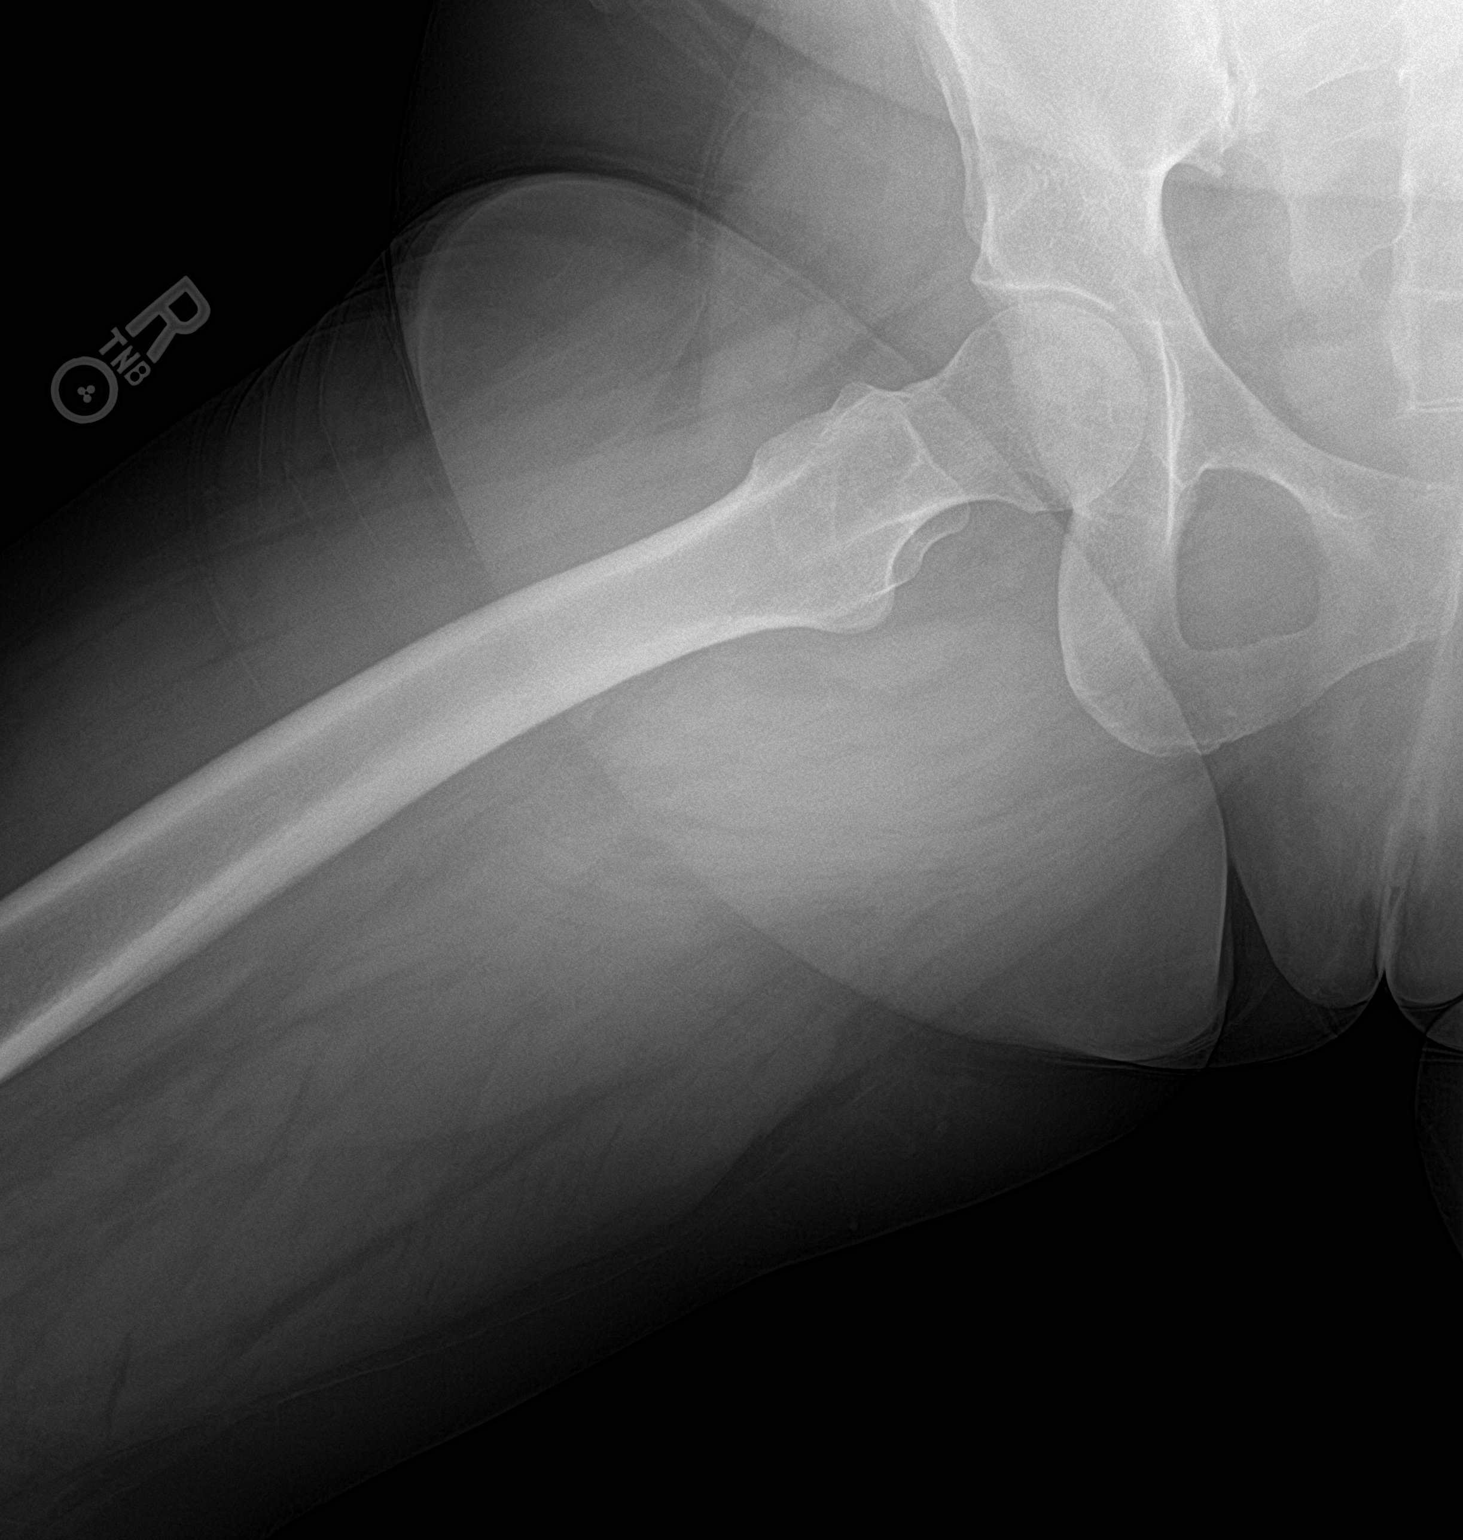

[3 of 3 positions shown; findings below may reference images not displayed]

FINDINGS: There is no evidence of hip fracture or dislocation. There is no
evidence of arthropathy or other focal bone abnormality.
IMPRESSION: Negative.

## 2019-01-10 MED ORDER — CYCLOBENZAPRINE HCL 5 MG PO TABS: 5.0000 mg | ORAL_TABLET | Freq: Two times a day (BID) | ORAL | 0 refills | Status: DC | PRN

## 2019-01-10 MED ORDER — KETOROLAC TROMETHAMINE 30 MG/ML IJ SOLN
INTRAMUSCULAR | Status: AC
  Filled 2019-01-10: qty 1

## 2019-01-10 MED ORDER — METHYLPREDNISOLONE ACETATE 80 MG/ML IJ SUSP
INTRAMUSCULAR | Status: AC
  Filled 2019-01-10: qty 1

## 2019-01-10 MED ORDER — MELOXICAM 7.5 MG PO TABS: 7.5000 mg | ORAL_TABLET | Freq: Every day | ORAL | 0 refills | Status: DC

## 2019-01-10 MED ORDER — KETOROLAC TROMETHAMINE 30 MG/ML IJ SOLN
30.0000 mg | Freq: Once | INTRAMUSCULAR | Status: AC
  Administered 2019-01-10: 30 mg via INTRAMUSCULAR

## 2019-01-10 MED ORDER — METHYLPREDNISOLONE ACETATE 80 MG/ML IJ SUSP
80.0000 mg | Freq: Once | INTRAMUSCULAR | Status: AC
  Administered 2019-01-10: 80 mg via INTRAMUSCULAR

## 2019-01-10 NOTE — Unmapped (Signed)
Behavioral Health Collaborative Care Management Psychiatric Consultation    Consultation with [facility] Manager in regards to patient's behavioral health needs. Reviewed [facility] care plan and discussed patient treatment progress.    38 year old with a history of Crohn's disease, chronic pain, depression and anxiety.????Current medication regiment: Cymbalta 60 mg po daily, Remeron 30 mg nightly, Hydroxyzine 50 mg PRN.     Interval hx per CM:  She continues to try and establish with psychiatry but is currently unable to do video visits which is limiting setting up an appointment.      No revision to care plan    - CM to continue to support process in establishing with psychiatry.  We discussed whether she could see if a friend has a computer or phone with video capabilities that she could borrow for an appointment.    This case review was conducted with information gathered from chart and from care manager information.  I have not examined the patient directly.  Physician should incorporate recommendations as appropriate given full current clinical picture.    Dr. Lonell Face was present during the case consultation review and is in agreement with above recommendations and plan.    Karie Chimera, MD  01/09/2019

## 2019-01-10 NOTE — Discharge Instructions (Signed)
Xray normal  We gave you a shot of Toradol and depomedrol today Continue with mobic daily with food beginning tomorrow You may use flexeril as needed to help with pain. This is a muscle relaxer and causes sedation- please use only at bedtime or when you will be home and not have to drive/work  Ice hip  Follow up with your primary care or orthopedics if symptoms persisting If developing increased weakness follow up in emergency room

## 2019-01-10 NOTE — ED Triage Notes (Signed)
PT reports right hip pain that started Sunday. PT reports a popping sensation. Pain in lower back shoots down right leg. No injury.
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
## 2019-01-13 ENCOUNTER — Encounter (HOSPITAL_COMMUNITY)
Admission: EM | Disposition: A | Discharge status: Home / Self Care | Payer: Self-pay | Source: Home / Self Care | Attending: Internal Medicine

## 2019-01-13 ENCOUNTER — Inpatient Hospital Stay (HOSPITAL_COMMUNITY): Payer: Medicaid Other

## 2019-01-13 ENCOUNTER — Inpatient Hospital Stay (HOSPITAL_COMMUNITY): Payer: Medicaid Other | Admitting: Anesthesiology

## 2019-01-13 ENCOUNTER — Emergency Department (HOSPITAL_COMMUNITY): Payer: Medicaid Other

## 2019-01-13 ENCOUNTER — Inpatient Hospital Stay (HOSPITAL_COMMUNITY)
Admission: EM | Admit: 2019-01-13 | Discharge: 2019-01-15 | DRG: 481 | Disposition: A | Discharge status: Home / Self Care | Payer: Medicaid Other | Attending: Internal Medicine | Admitting: Internal Medicine

## 2019-01-13 ENCOUNTER — Encounter (HOSPITAL_COMMUNITY): Payer: Self-pay | Admitting: Emergency Medicine

## 2019-01-13 ENCOUNTER — Other Ambulatory Visit: Payer: Self-pay

## 2019-01-13 DIAGNOSIS — E538 Deficiency of other specified B group vitamins: Secondary | ICD-10-CM | POA: Diagnosis present

## 2019-01-13 DIAGNOSIS — G8929 Other chronic pain: Secondary | ICD-10-CM | POA: Diagnosis present

## 2019-01-13 DIAGNOSIS — Z880 Allergy status to penicillin: Secondary | ICD-10-CM | POA: Diagnosis not present

## 2019-01-13 DIAGNOSIS — K449 Diaphragmatic hernia without obstruction or gangrene: Secondary | ICD-10-CM | POA: Diagnosis present

## 2019-01-13 DIAGNOSIS — Z9889 Other specified postprocedural states: Secondary | ICD-10-CM

## 2019-01-13 DIAGNOSIS — K5000 Crohn's disease of small intestine without complications: Secondary | ICD-10-CM | POA: Diagnosis present

## 2019-01-13 DIAGNOSIS — F411 Generalized anxiety disorder: Secondary | ICD-10-CM | POA: Diagnosis present

## 2019-01-13 DIAGNOSIS — M1990 Unspecified osteoarthritis, unspecified site: Secondary | ICD-10-CM | POA: Diagnosis present

## 2019-01-13 DIAGNOSIS — M80851A Other osteoporosis with current pathological fracture, right femur, initial encounter for fracture: Secondary | ICD-10-CM | POA: Diagnosis present

## 2019-01-13 DIAGNOSIS — M545 Low back pain: Secondary | ICD-10-CM | POA: Diagnosis present

## 2019-01-13 DIAGNOSIS — Z8781 Personal history of (healed) traumatic fracture: Secondary | ICD-10-CM

## 2019-01-13 DIAGNOSIS — Z881 Allergy status to other antibiotic agents status: Secondary | ICD-10-CM

## 2019-01-13 DIAGNOSIS — F17210 Nicotine dependence, cigarettes, uncomplicated: Secondary | ICD-10-CM | POA: Diagnosis present

## 2019-01-13 DIAGNOSIS — Z832 Family history of diseases of the blood and blood-forming organs and certain disorders involving the immune mechanism: Secondary | ICD-10-CM

## 2019-01-13 DIAGNOSIS — K219 Gastro-esophageal reflux disease without esophagitis: Secondary | ICD-10-CM | POA: Diagnosis present

## 2019-01-13 DIAGNOSIS — K5080 Crohn's disease of both small and large intestine without complications: Secondary | ICD-10-CM | POA: Diagnosis present

## 2019-01-13 DIAGNOSIS — F1120 Opioid dependence, uncomplicated: Secondary | ICD-10-CM | POA: Diagnosis present

## 2019-01-13 DIAGNOSIS — S72001A Fracture of unspecified part of neck of right femur, initial encounter for closed fracture: Secondary | ICD-10-CM | POA: Diagnosis present

## 2019-01-13 DIAGNOSIS — I10 Essential (primary) hypertension: Secondary | ICD-10-CM | POA: Diagnosis present

## 2019-01-13 DIAGNOSIS — Z791 Long term (current) use of non-steroidal anti-inflammatories (NSAID): Secondary | ICD-10-CM | POA: Diagnosis not present

## 2019-01-13 DIAGNOSIS — M549 Dorsalgia, unspecified: Secondary | ICD-10-CM | POA: Diagnosis not present

## 2019-01-13 DIAGNOSIS — Z8711 Personal history of peptic ulcer disease: Secondary | ICD-10-CM

## 2019-01-13 DIAGNOSIS — F909 Attention-deficit hyperactivity disorder, unspecified type: Secondary | ICD-10-CM | POA: Diagnosis present

## 2019-01-13 DIAGNOSIS — M8580 Other specified disorders of bone density and structure, unspecified site: Secondary | ICD-10-CM | POA: Diagnosis present

## 2019-01-13 DIAGNOSIS — L309 Dermatitis, unspecified: Secondary | ICD-10-CM | POA: Diagnosis present

## 2019-01-13 DIAGNOSIS — Z800 Family history of malignant neoplasm of digestive organs: Secondary | ICD-10-CM

## 2019-01-13 DIAGNOSIS — Z20828 Contact with and (suspected) exposure to other viral communicable diseases: Secondary | ICD-10-CM | POA: Diagnosis present

## 2019-01-13 DIAGNOSIS — M797 Fibromyalgia: Secondary | ICD-10-CM | POA: Diagnosis present

## 2019-01-13 DIAGNOSIS — D72829 Elevated white blood cell count, unspecified: Secondary | ICD-10-CM

## 2019-01-13 HISTORY — PX: HIP PINNING,CANNULATED: SHX1758

## 2019-01-13 LAB — CBC
HCT: 41.3 % (ref 36.0–46.0)
Hemoglobin: 13.1 g/dL (ref 12.0–15.0)
MCH: 28.2 pg (ref 26.0–34.0)
MCHC: 31.7 g/dL (ref 30.0–36.0)
MCV: 88.8 fL (ref 80.0–100.0)
Platelets: 411 10*3/uL — ABNORMAL HIGH (ref 150–400)
RBC: 4.65 MIL/uL (ref 3.87–5.11)
RDW: 15.2 % (ref 11.5–15.5)
WBC: 18.4 10*3/uL — ABNORMAL HIGH (ref 4.0–10.5)
nRBC: 0 % (ref 0.0–0.2)

## 2019-01-13 LAB — BASIC METABOLIC PANEL
Anion gap: 16 — ABNORMAL HIGH (ref 5–15)
BUN: 19 mg/dL (ref 6–20)
CO2: 15 mmol/L — ABNORMAL LOW (ref 22–32)
Calcium: 8.9 mg/dL (ref 8.9–10.3)
Chloride: 107 mmol/L (ref 98–111)
Creatinine, Ser: 1.17 mg/dL — ABNORMAL HIGH (ref 0.44–1.00)
GFR calc Af Amer: >60 mL/min (ref >60)
GFR calc non Af Amer: 59 mL/min — ABNORMAL LOW (ref >60)
Glucose, Bld: 87 mg/dL (ref 70–99)
Potassium: 3.8 mmol/L (ref 3.5–5.1)
Sodium: 138 mmol/L (ref 135–145)

## 2019-01-13 LAB — HEPATIC FUNCTION PANEL
ALT: 11 U/L (ref 0–44)
AST: 15 U/L (ref 15–41)
Albumin: 4 g/dL (ref 3.5–5.0)
Alkaline Phosphatase: 95 U/L (ref 38–126)
Bilirubin, Direct: <0.1 mg/dL (ref 0.0–0.2)
Total Bilirubin: 0.7 mg/dL (ref 0.3–1.2)
Total Protein: 7.1 g/dL (ref 6.5–8.1)

## 2019-01-13 LAB — CBC WITH DIFFERENTIAL/PLATELET
Abs Immature Granulocytes: 0.35 10*3/uL — ABNORMAL HIGH (ref 0.00–0.07)
Basophils Absolute: 0.2 10*3/uL — ABNORMAL HIGH (ref 0.0–0.1)
Basophils Relative: 1 %
Eosinophils Absolute: 0.2 10*3/uL (ref 0.0–0.5)
Eosinophils Relative: 1 %
HCT: 44.5 % (ref 36.0–46.0)
Hemoglobin: 14.1 g/dL (ref 12.0–15.0)
Immature Granulocytes: 2 %
Lymphocytes Relative: 19 %
Lymphs Abs: 4 10*3/uL (ref 0.7–4.0)
MCH: 28.1 pg (ref 26.0–34.0)
MCHC: 31.7 g/dL (ref 30.0–36.0)
MCV: 88.6 fL (ref 80.0–100.0)
Monocytes Absolute: 1.3 10*3/uL — ABNORMAL HIGH (ref 0.1–1.0)
Monocytes Relative: 6 %
Neutro Abs: 15.4 10*3/uL — ABNORMAL HIGH (ref 1.7–7.7)
Neutrophils Relative %: 71 %
Platelets: 447 10*3/uL — ABNORMAL HIGH (ref 150–400)
RBC: 5.02 MIL/uL (ref 3.87–5.11)
RDW: 15.4 % (ref 11.5–15.5)
WBC: 21.5 10*3/uL — ABNORMAL HIGH (ref 4.0–10.5)
nRBC: 0 % (ref 0.0–0.2)

## 2019-01-13 LAB — I-STAT BETA HCG BLOOD, ED (MC, WL, AP ONLY): I-stat hCG, quantitative: <5 m[IU]/mL (ref <5)

## 2019-01-13 LAB — LACTIC ACID, PLASMA
Lactic Acid, Venous: 2 mmol/L (ref 0.5–1.9)
Lactic Acid, Venous: 1.8 mmol/L (ref 0.5–1.9)

## 2019-01-13 LAB — CREATININE, SERUM
Creatinine, Ser: 1.17 mg/dL — ABNORMAL HIGH (ref 0.44–1.00)
GFR calc Af Amer: >60 mL/min (ref >60)
GFR calc non Af Amer: 59 mL/min — ABNORMAL LOW (ref >60)

## 2019-01-13 LAB — SARS CORONAVIRUS 2 BY RT PCR (HOSPITAL ORDER, PERFORMED IN ~~LOC~~ HOSPITAL LAB): SARS Coronavirus 2: NEGATIVE

## 2019-01-13 LAB — SEDIMENTATION RATE: Sed Rate: 0 mm/hr (ref 0–22)

## 2019-01-13 LAB — C-REACTIVE PROTEIN: CRP: <0.8 mg/dL (ref <1.0)

## 2019-01-13 IMAGING — DX PORTABLE PELVIS 1-2 VIEWS
1 series · 1 of 1 positions shown · non-contrast
Comparison: [DATE].

CLINICAL DATA: 37-year-old female status post ORIF of the right
hip.

EXAM:
PORTABLE PELVIS 1-2 VIEWS

[pelvis]
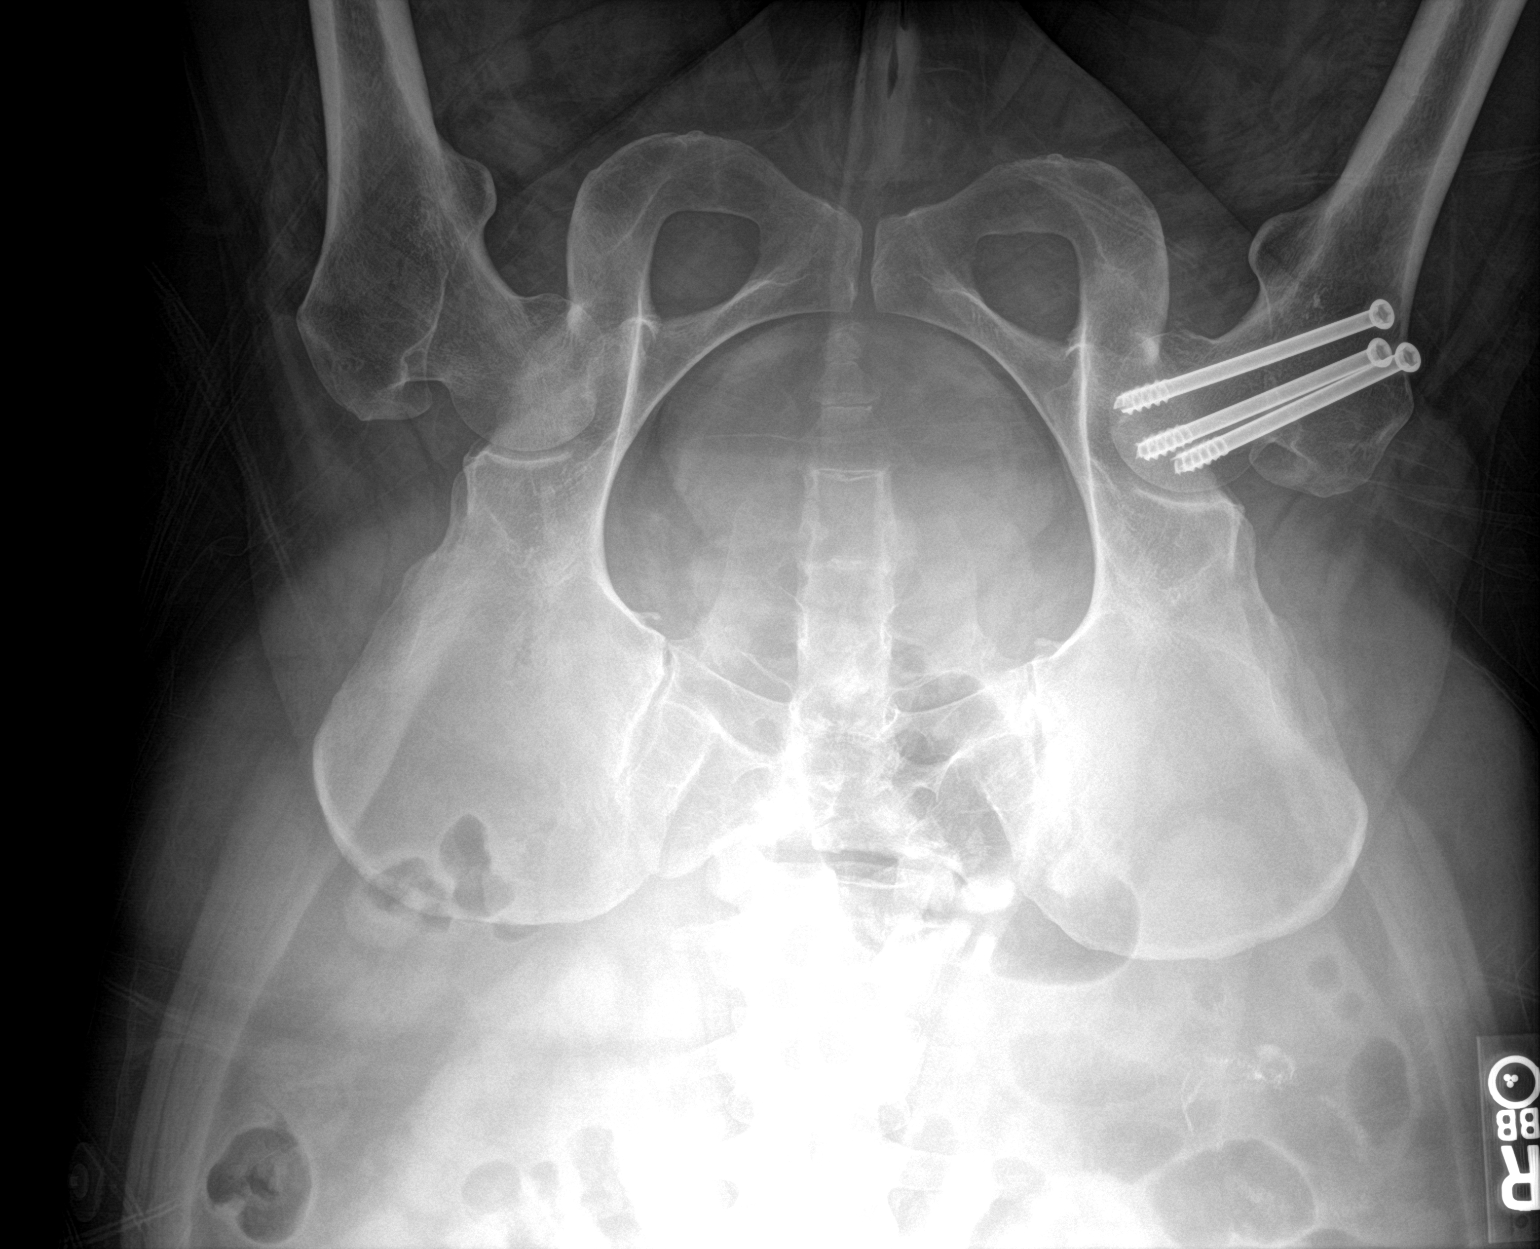

[1 of 1 positions shown; findings below may reference images not displayed]

FINDINGS: Three fixation screws traverse the previously noted basicervical
femoral neck fracture. Alignment appears anatomic. Bony pelvis is
otherwise unremarkable.
IMPRESSION: 1. Status post ORIF in right hip without complicating features, as
above.

## 2019-01-13 IMAGING — RF DG C-ARM 61-120 MIN
1 series · 3 of 3 positions shown · non-contrast
Comparison: Prior CT from earlier the same day.

CLINICAL DATA: Intraoperative fluoroscopic views for ORIF of right
hip fracture.

EXAM:
OPERATIVE RIGHT HIP (WITH PELVIS IF PERFORMED) 3 VIEWS
TECHNIQUE: Fluoroscopic spot image(s) were submitted for interpretation
post-operatively.

[Series 1: run · 3 of 3 slices shown]
[im 1/3]
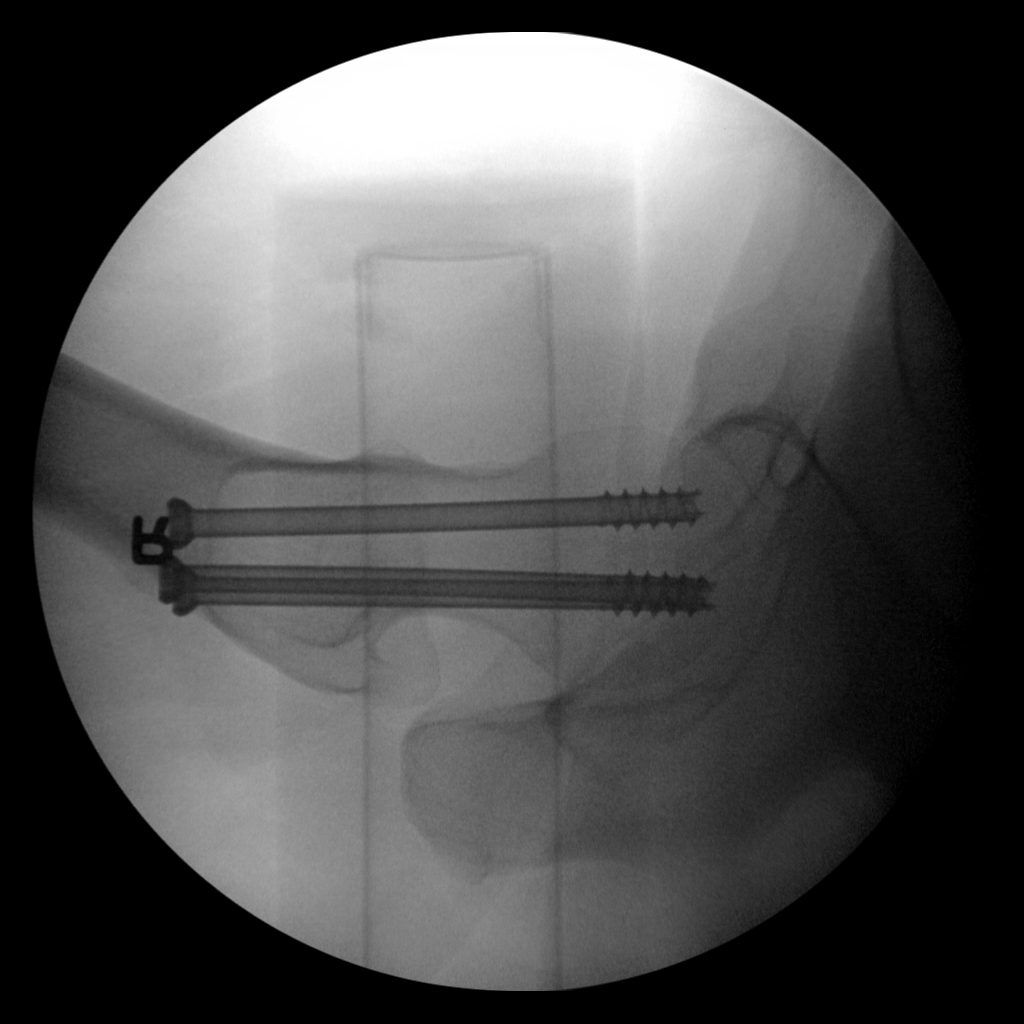
[im 2/3]
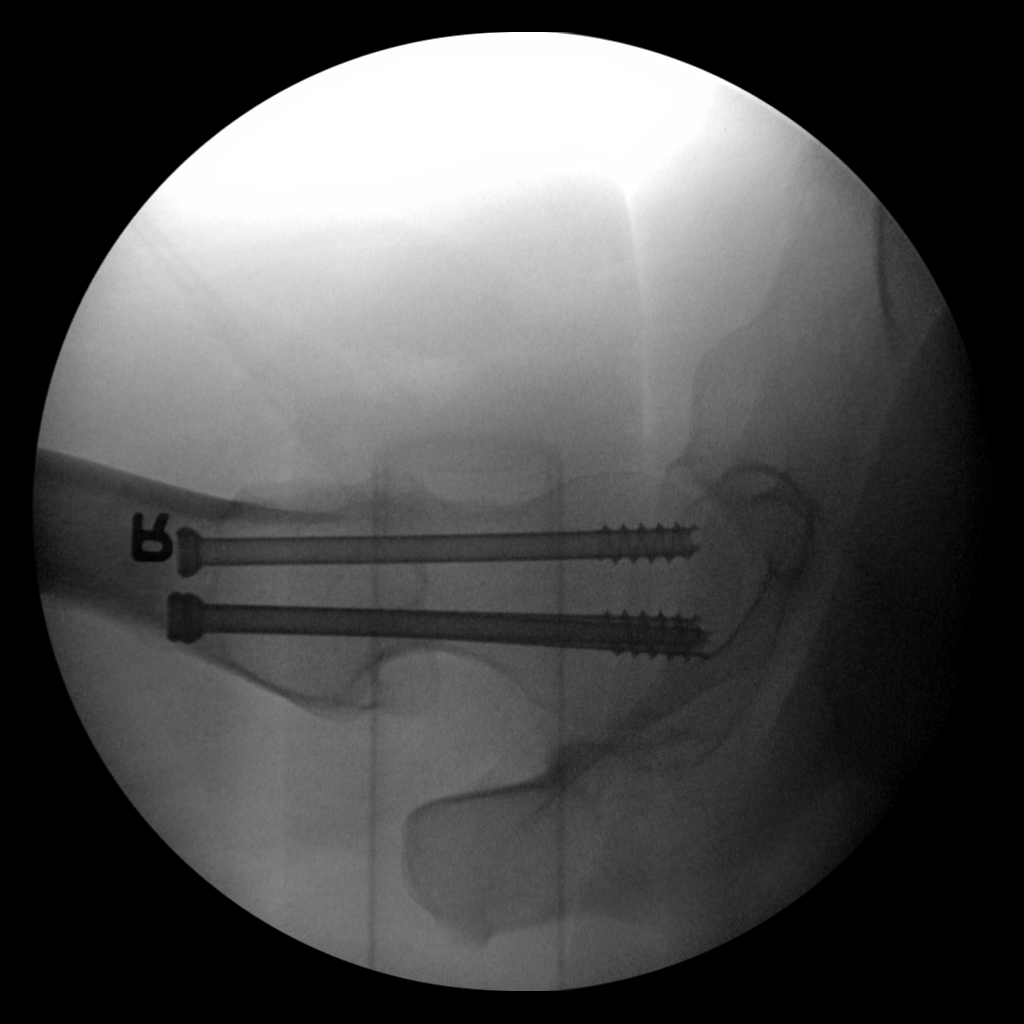
[im 3/3]
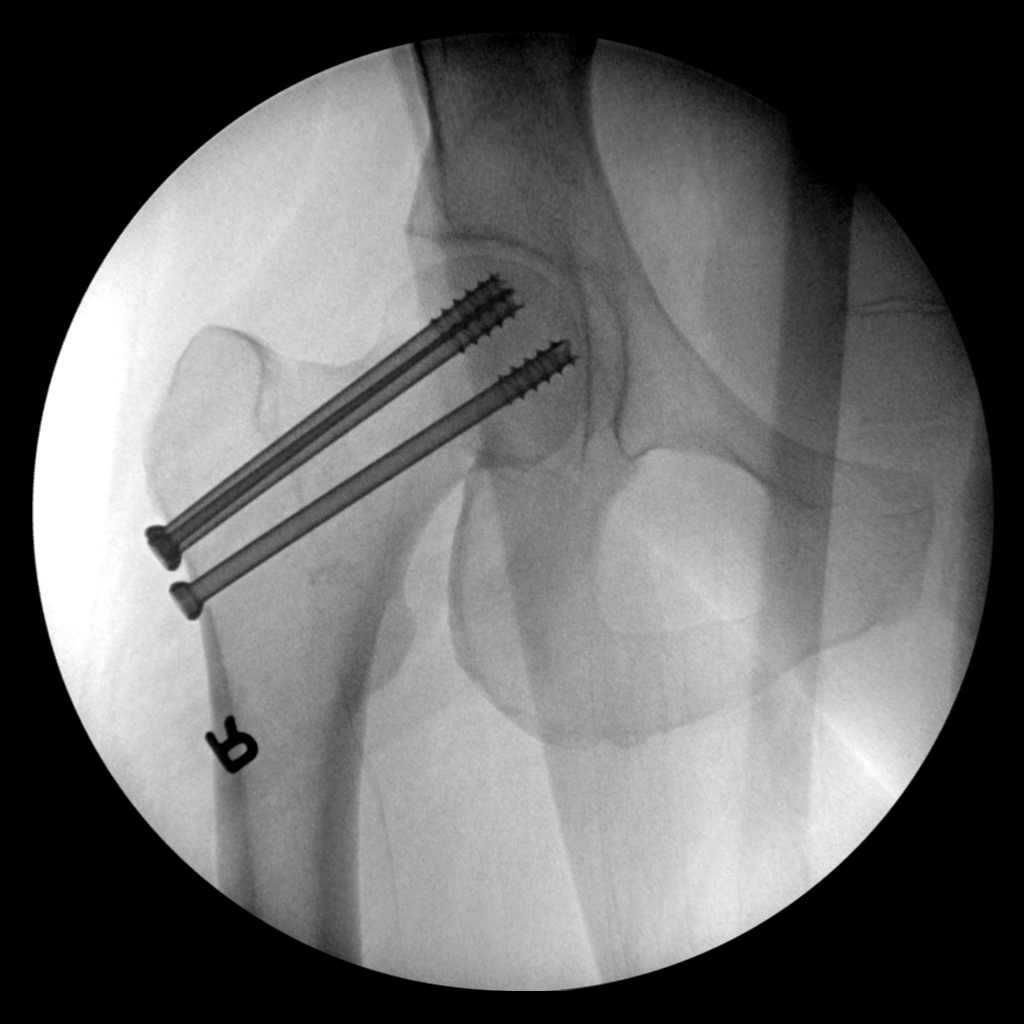

[3 of 3 positions shown; findings below may reference images not displayed]

FINDINGS: Three spot intraoperative AP and lateral views of the right hip from
ORIF of right hip fracture seen. Placement of 3 cannulated lag
fixation screws into the right femoral head and neck. Hardware
appears well aligned without complication. No other new osseous
abnormality.
IMPRESSION: Intraoperative fluoroscopic views for ORIF of right hip fracture. No
complication.

## 2019-01-13 IMAGING — CT CT OF THE RIGHT HIP WITHOUT CONTRAST
2 of 3 series · 16 of 46 positions shown, 18 images · non-contrast
Comparison: Right hip x-rays dated [DATE]. CT abdomen pelvis
dated [DATE].

CLINICAL DATA: Chronic right hip pain, worsened over the past week.

EXAM:
CT OF THE RIGHT HIP WITHOUT CONTRAST
TECHNIQUE: Multidetector CT imaging of the right hip was performed according to
the standard protocol. Multiplanar CT image reconstructions were
also generated.

[Series 5: hip 2.0 st · axial · 0.43mm/px · z∈[+620,+864]mm · 13 of 142 slices shown, 15 images]
[im 10/142  soft-tissue]
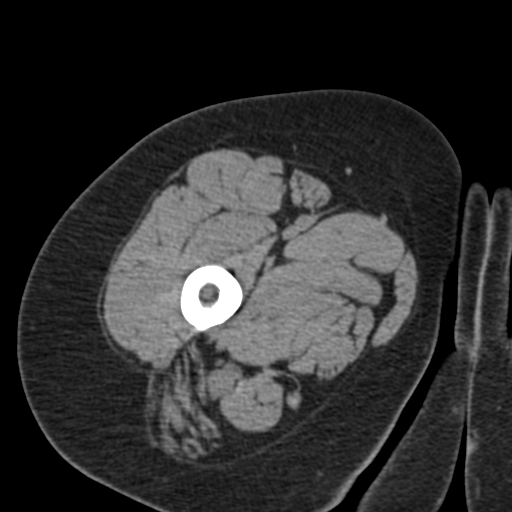
[im 10/142  bone]
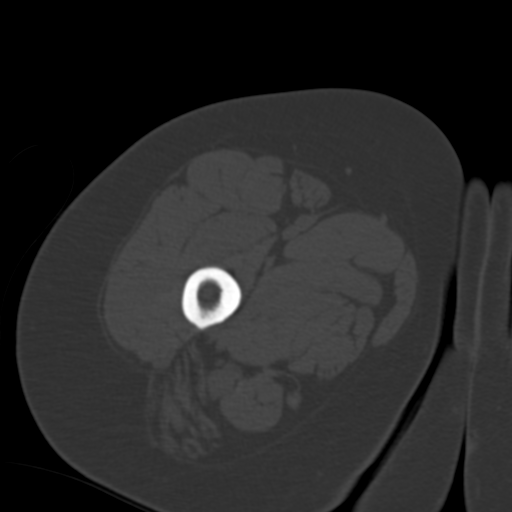
[im 19/142  soft-tissue]
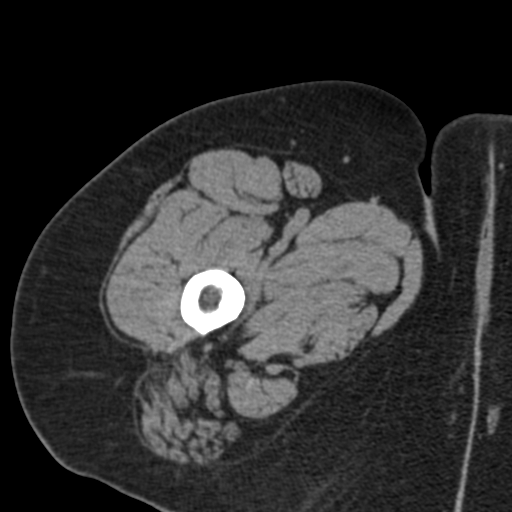
[im 28/142  soft-tissue]
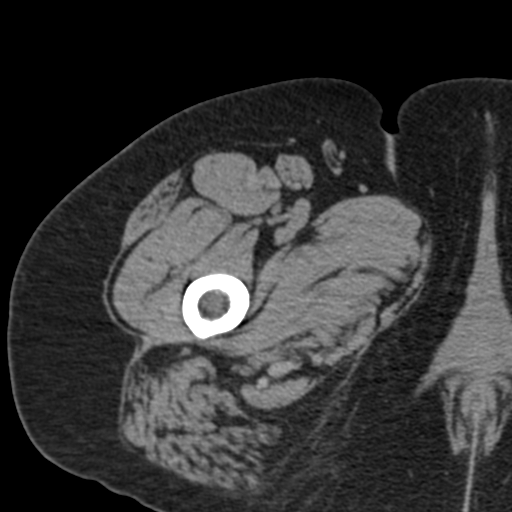
[im 41/142  soft-tissue]
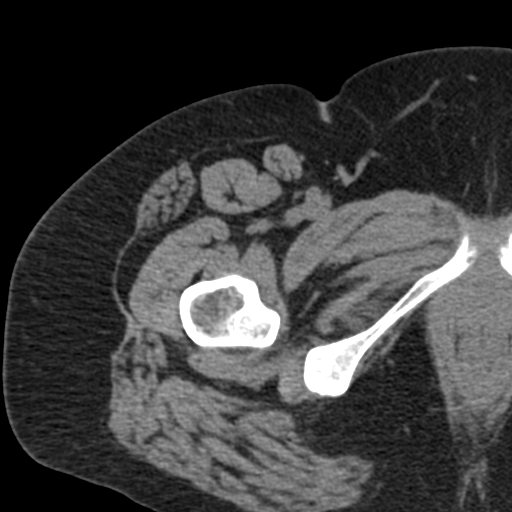
[im 51/142  soft-tissue]
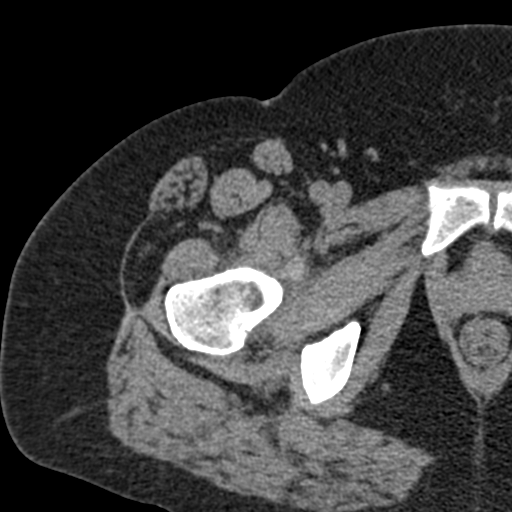
[im 60/142  soft-tissue]
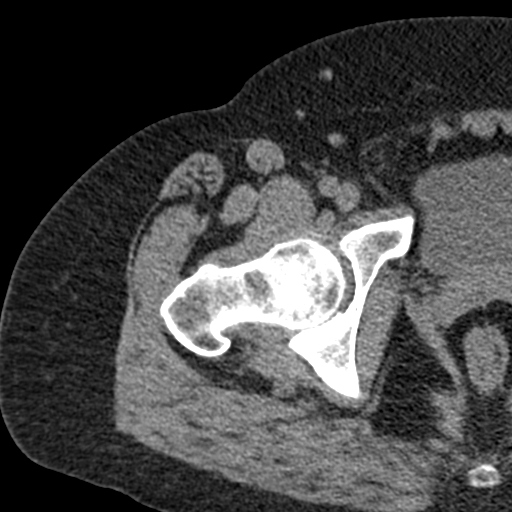
[im 73/142  soft-tissue]
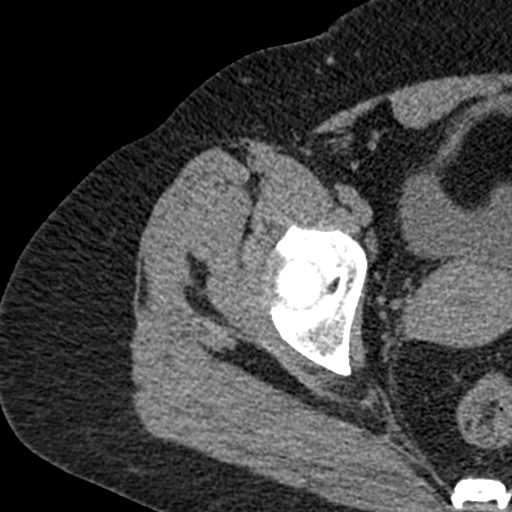
[im 82/142  soft-tissue]
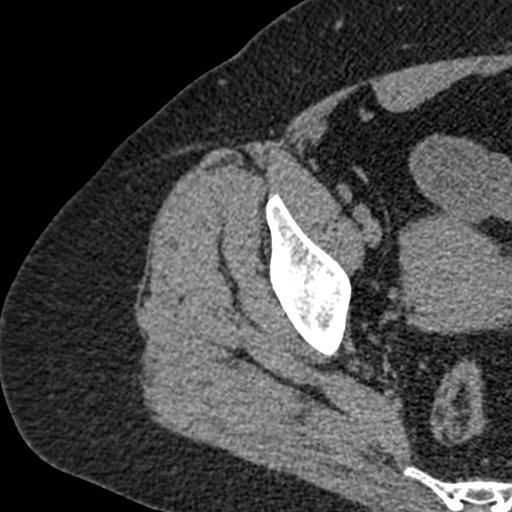
[im 91/142  soft-tissue]
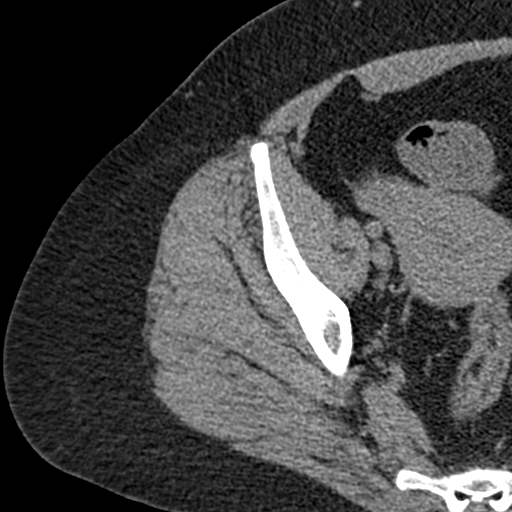
[im 91/142  bone]
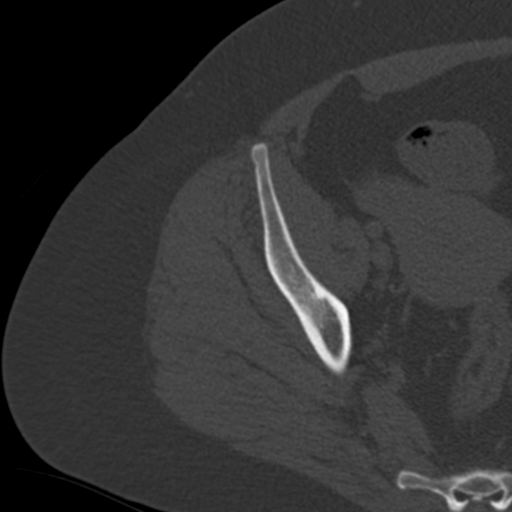
[im 101/142  soft-tissue]
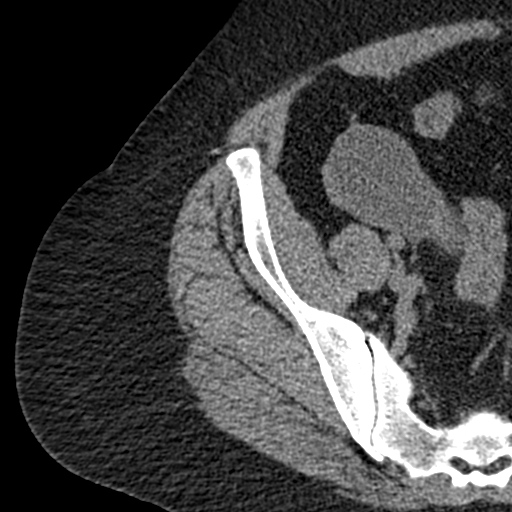
[im 114/142  soft-tissue]
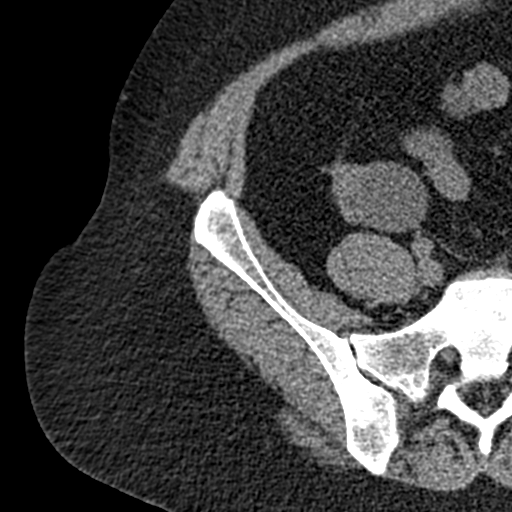
[im 123/142  soft-tissue]
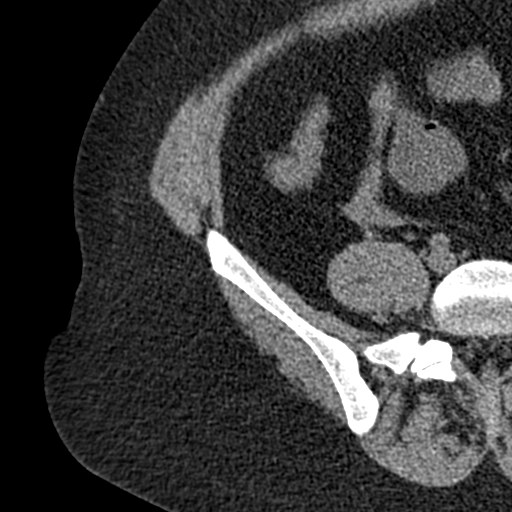
[im 132/142  soft-tissue]
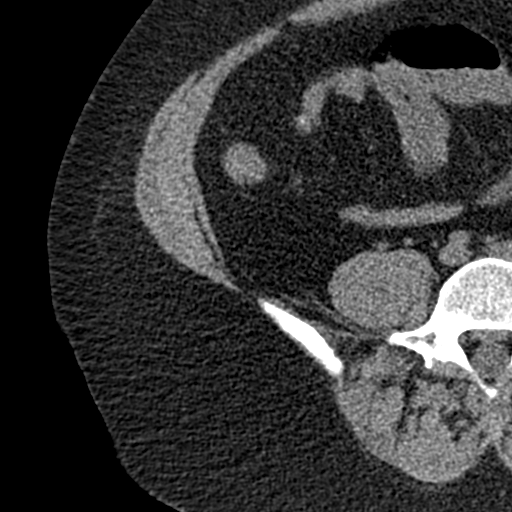

[Series 8: hip 2.0 cor. st · coronal · 0.39mm/px · 3 of 133 slices shown]
[im 45/133  soft-tissue]
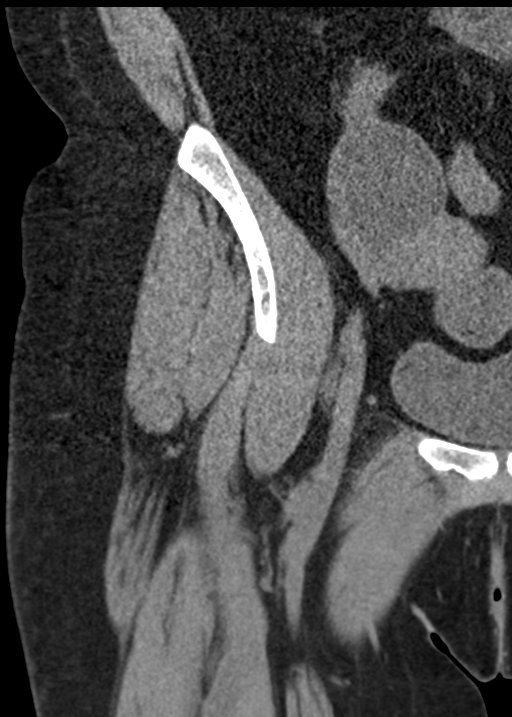
[im 59/133  soft-tissue]
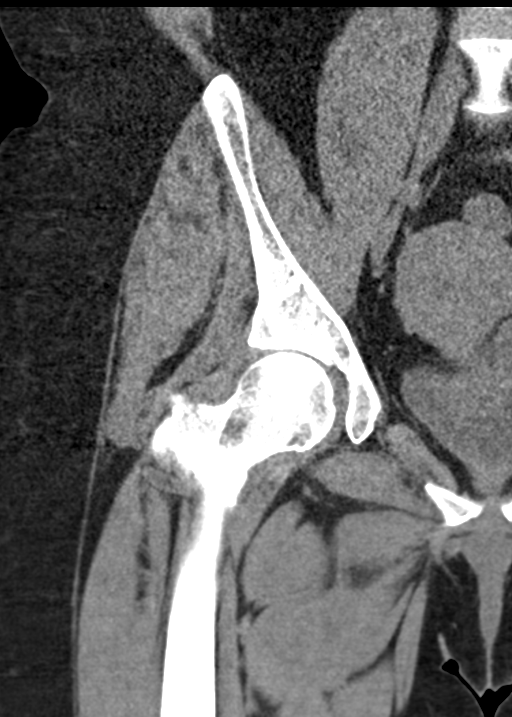
[im 74/133  soft-tissue]
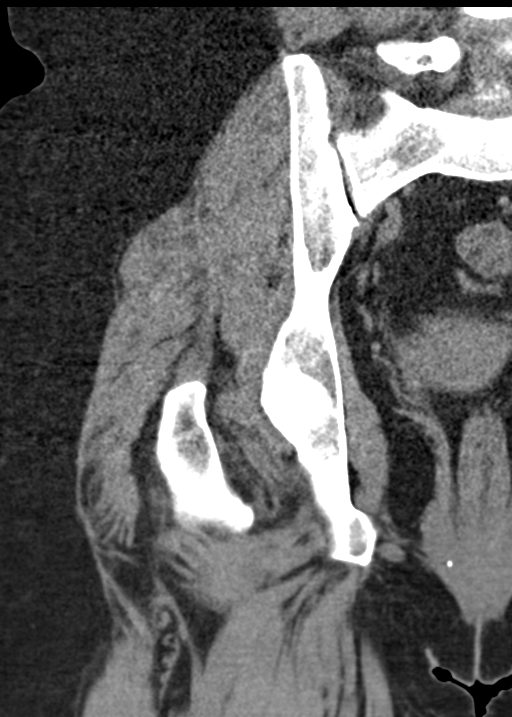

[16 of 46 positions shown; findings below may reference images not displayed]

FINDINGS: Bones/Joint/Cartilage

Acute nondisplaced basicervical femoral neck fracture. No
dislocation. The right hip joint space is preserved. No joint
effusion.

Ligaments

Ligaments are suboptimally evaluated by CT.

Muscles and Tendons
Grossly intact.  No muscle atrophy.

Soft tissue
No fluid collection or hematoma. No soft tissue mass. 5.0 cm right
ovarian cyst.
IMPRESSION: 1. Acute nondisplaced basicervical femoral neck fracture, likely
stress related given the absence of trauma.
2. 5.0 cm benign-appearing right ovarian cyst. Given the patient's
age, no follow-up is required. This recommendation follows ACR
consensus guidelines: White Paper of the ACR Incidental Findings
Committee II on Adnexal Findings. [HOSPITAL] [DATE].

## 2019-01-13 SURGERY — FIXATION, FEMUR, NECK, PERCUTANEOUS, USING SCREW
Anesthesia: General | Site: Hip | Laterality: Right

## 2019-01-13 MED ORDER — METOCLOPRAMIDE HCL 5 MG/ML IJ SOLN: 5.0000 mg | Freq: Three times a day (TID) | INTRAMUSCULAR | Status: DC | PRN

## 2019-01-13 MED ORDER — ENOXAPARIN SODIUM 40 MG/0.4ML ~~LOC~~ SOLN: 40.0000 mg | SUBCUTANEOUS | Status: DC

## 2019-01-13 MED ORDER — DEXAMETHASONE SODIUM PHOSPHATE 10 MG/ML IJ SOLN
INTRAMUSCULAR | Status: DC | PRN
  Administered 2019-01-13: 8 mg via INTRAVENOUS

## 2019-01-13 MED ORDER — MORPHINE SULFATE (PF) 2 MG/ML IV SOLN: 0.5000 mg | INTRAVENOUS | Status: DC | PRN

## 2019-01-13 MED ORDER — PROPOFOL 10 MG/ML IV BOLUS
INTRAVENOUS | Status: DC | PRN
  Administered 2019-01-13: 120 mg via INTRAVENOUS

## 2019-01-13 MED ORDER — HYDROCODONE-ACETAMINOPHEN 5-325 MG PO TABS: 1.0000 | ORAL_TABLET | Freq: Four times a day (QID) | ORAL | Status: DC | PRN

## 2019-01-13 MED ORDER — PHENOL 1.4 % MT LIQD: 1.0000 | OROMUCOSAL | Status: DC | PRN

## 2019-01-13 MED ORDER — PANTOPRAZOLE SODIUM 40 MG PO TBEC
40.0000 mg | DELAYED_RELEASE_TABLET | Freq: Two times a day (BID) | ORAL | Status: DC
  Administered 2019-01-14 – 2019-01-15 (×3): 40 mg via ORAL
  Filled 2019-01-13 (×3): qty 1

## 2019-01-13 MED ORDER — ENOXAPARIN SODIUM 40 MG/0.4ML ~~LOC~~ SOLN: 40.0000 mg | SUBCUTANEOUS | 0 refills | Status: DC

## 2019-01-13 MED ORDER — METOCLOPRAMIDE HCL 5 MG PO TABS: 5.0000 mg | ORAL_TABLET | Freq: Three times a day (TID) | ORAL | Status: DC | PRN

## 2019-01-13 MED ORDER — SENNA-DOCUSATE SODIUM 8.6-50 MG PO TABS: 2.0000 | ORAL_TABLET | Freq: Every day | ORAL | 1 refills | Status: DC

## 2019-01-13 MED ORDER — CEFAZOLIN SODIUM-DEXTROSE 2-4 GM/100ML-% IV SOLN
INTRAVENOUS | Status: AC
  Filled 2019-01-13: qty 100

## 2019-01-13 MED ORDER — DOCUSATE SODIUM 100 MG PO CAPS
100.0000 mg | ORAL_CAPSULE | Freq: Two times a day (BID) | ORAL | Status: DC
  Administered 2019-01-13: 100 mg via ORAL
  Filled 2019-01-13 (×3): qty 1

## 2019-01-13 MED ORDER — MENTHOL 3 MG MT LOZG: 1.0000 | LOZENGE | OROMUCOSAL | Status: DC | PRN

## 2019-01-13 MED ORDER — FERROUS SULFATE 325 (65 FE) MG PO TABS
325.0000 mg | ORAL_TABLET | Freq: Three times a day (TID) | ORAL | Status: DC
  Administered 2019-01-14 – 2019-01-15 (×5): 325 mg via ORAL
  Filled 2019-01-13 (×5): qty 1

## 2019-01-13 MED ORDER — ENOXAPARIN SODIUM 40 MG/0.4ML ~~LOC~~ SOLN
40.0000 mg | Freq: Every day | SUBCUTANEOUS | Status: DC
  Administered 2019-01-14 – 2019-01-15 (×2): 40 mg via SUBCUTANEOUS
  Filled 2019-01-13 (×3): qty 0.4

## 2019-01-13 MED ORDER — MORPHINE SULFATE (PF) 2 MG/ML IV SOLN
0.5000 mg | INTRAVENOUS | Status: DC | PRN
  Administered 2019-01-14: 0.5 mg via INTRAVENOUS
  Filled 2019-01-13: qty 1

## 2019-01-13 MED ORDER — LACTATED RINGERS IV SOLN: INTRAVENOUS | Status: DC

## 2019-01-13 MED ORDER — ONDANSETRON HCL 4 MG/2ML IJ SOLN: 4.0000 mg | Freq: Once | INTRAMUSCULAR | Status: DC | PRN

## 2019-01-13 MED ORDER — MIDAZOLAM HCL 2 MG/2ML IJ SOLN
INTRAMUSCULAR | Status: AC
  Filled 2019-01-13: qty 2

## 2019-01-13 MED ORDER — KETOROLAC TROMETHAMINE 15 MG/ML IJ SOLN
7.5000 mg | Freq: Four times a day (QID) | INTRAMUSCULAR | Status: AC
  Administered 2019-01-13 – 2019-01-14 (×4): 7.5 mg via INTRAVENOUS
  Filled 2019-01-13 (×3): qty 1

## 2019-01-13 MED ORDER — LACTATED RINGERS IV SOLN
INTRAVENOUS | Status: DC | PRN
  Administered 2019-01-13 (×2): via INTRAVENOUS

## 2019-01-13 MED ORDER — OXYCODONE HCL 5 MG PO TABS
5.0000 mg | ORAL_TABLET | Freq: Once | ORAL | Status: AC
  Administered 2019-01-13: 5 mg via ORAL
  Filled 2019-01-13: qty 1

## 2019-01-13 MED ORDER — LABETALOL HCL 5 MG/ML IV SOLN
10.0000 mg | INTRAVENOUS | Status: DC | PRN
  Administered 2019-01-13: 10 mg via INTRAVENOUS

## 2019-01-13 MED ORDER — POTASSIUM CHLORIDE IN NACL 20-0.9 MEQ/L-% IV SOLN
INTRAVENOUS | Status: DC
  Administered 2019-01-13: via INTRAVENOUS
  Filled 2019-01-13: qty 1000

## 2019-01-13 MED ORDER — ONDANSETRON HCL 4 MG/2ML IJ SOLN: 4.0000 mg | Freq: Four times a day (QID) | INTRAMUSCULAR | Status: DC | PRN

## 2019-01-13 MED ORDER — OXYCODONE HCL 5 MG PO TABS: 5.0000 mg | ORAL_TABLET | ORAL | 0 refills | Status: DC | PRN

## 2019-01-13 MED ORDER — HYDROCODONE-ACETAMINOPHEN 5-325 MG PO TABS: 1.0000 | ORAL_TABLET | Freq: Four times a day (QID) | ORAL | 0 refills | Status: DC | PRN

## 2019-01-13 MED ORDER — HYDROMORPHONE HCL 1 MG/ML IJ SOLN
0.5000 mg | Freq: Once | INTRAMUSCULAR | Status: AC
  Administered 2019-01-13: 0.5 mg via INTRAVENOUS
  Filled 2019-01-13: qty 1

## 2019-01-13 MED ORDER — MORPHINE SULFATE (PF) 4 MG/ML IV SOLN
4.0000 mg | Freq: Once | INTRAVENOUS | Status: AC
  Administered 2019-01-13: 4 mg via INTRAVENOUS
  Filled 2019-01-13: qty 1

## 2019-01-13 MED ORDER — MIDAZOLAM HCL 5 MG/5ML IJ SOLN
INTRAMUSCULAR | Status: DC | PRN
  Administered 2019-01-13: 2 mg via INTRAVENOUS

## 2019-01-13 MED ORDER — ALUM & MAG HYDROXIDE-SIMETH 200-200-20 MG/5ML PO SUSP: 30.0000 mL | ORAL | Status: DC | PRN

## 2019-01-13 MED ORDER — ONDANSETRON HCL 4 MG PO TABS: 4.0000 mg | ORAL_TABLET | Freq: Four times a day (QID) | ORAL | Status: DC | PRN

## 2019-01-13 MED ORDER — OXYCODONE HCL 5 MG PO TABS
5.0000 mg | ORAL_TABLET | ORAL | Status: DC | PRN
  Administered 2019-01-14 (×3): 10 mg via ORAL
  Filled 2019-01-13 (×3): qty 2

## 2019-01-13 MED ORDER — ALBUTEROL SULFATE HFA 108 (90 BASE) MCG/ACT IN AERS
INHALATION_SPRAY | RESPIRATORY_TRACT | Status: DC | PRN
  Administered 2019-01-13: 2 via RESPIRATORY_TRACT

## 2019-01-13 MED ORDER — ROCURONIUM BROMIDE 50 MG/5ML IV SOSY
PREFILLED_SYRINGE | INTRAVENOUS | Status: DC | PRN
  Administered 2019-01-13: 50 mg via INTRAVENOUS

## 2019-01-13 MED ORDER — ACETAMINOPHEN 500 MG PO TABS
500.0000 mg | ORAL_TABLET | Freq: Four times a day (QID) | ORAL | Status: AC
  Administered 2019-01-13 – 2019-01-14 (×4): 500 mg via ORAL
  Filled 2019-01-13 (×3): qty 1

## 2019-01-13 MED ORDER — FENTANYL CITRATE (PF) 250 MCG/5ML IJ SOLN
INTRAMUSCULAR | Status: AC
  Filled 2019-01-13: qty 5

## 2019-01-13 MED ORDER — BACLOFEN 10 MG PO TABS: 10.0000 mg | ORAL_TABLET | Freq: Three times a day (TID) | ORAL | 0 refills | Status: DC

## 2019-01-13 MED ORDER — KETOROLAC TROMETHAMINE 15 MG/ML IJ SOLN
INTRAMUSCULAR | Status: AC
  Administered 2019-01-13: 7.5 mg via INTRAVENOUS
  Filled 2019-01-13: qty 1

## 2019-01-13 MED ORDER — FENTANYL CITRATE (PF) 100 MCG/2ML IJ SOLN
INTRAMUSCULAR | Status: DC | PRN
  Administered 2019-01-13: 50 ug via INTRAVENOUS
  Administered 2019-01-13: 100 ug via INTRAVENOUS
  Administered 2019-01-13 (×2): 50 ug via INTRAVENOUS
  Administered 2019-01-13: 100 ug via INTRAVENOUS
  Administered 2019-01-13 (×2): 50 ug via INTRAVENOUS

## 2019-01-13 MED ORDER — MEPERIDINE HCL 25 MG/ML IJ SOLN
INTRAMUSCULAR | Status: AC
  Filled 2019-01-13: qty 1

## 2019-01-13 MED ORDER — MAGNESIUM CITRATE PO SOLN: 1.0000 | Freq: Once | ORAL | Status: DC | PRN

## 2019-01-13 MED ORDER — CEFAZOLIN SODIUM-DEXTROSE 2-4 GM/100ML-% IV SOLN
2.0000 g | Freq: Four times a day (QID) | INTRAVENOUS | Status: AC
  Administered 2019-01-14 (×2): 2 g via INTRAVENOUS
  Filled 2019-01-13 (×2): qty 100

## 2019-01-13 MED ORDER — ONDANSETRON HCL 4 MG/2ML IJ SOLN
INTRAMUSCULAR | Status: DC | PRN
  Administered 2019-01-13: 4 mg via INTRAVENOUS

## 2019-01-13 MED ORDER — HYDROMORPHONE HCL 1 MG/ML IJ SOLN
INTRAMUSCULAR | Status: AC
  Administered 2019-01-13: 0.5 mg via INTRAVENOUS
  Filled 2019-01-13: qty 1

## 2019-01-13 MED ORDER — MEPERIDINE HCL 25 MG/ML IJ SOLN
6.2500 mg | INTRAMUSCULAR | Status: DC | PRN
  Administered 2019-01-13: 12.5 mg via INTRAVENOUS

## 2019-01-13 MED ORDER — LABETALOL HCL 5 MG/ML IV SOLN
INTRAVENOUS | Status: AC
  Administered 2019-01-13: 10 mg via INTRAVENOUS
  Filled 2019-01-13: qty 4

## 2019-01-13 MED ORDER — DIPHENOXYLATE-ATROPINE 2.5-0.025 MG PO TABS: 1.0000 | ORAL_TABLET | ORAL | Status: DC | PRN

## 2019-01-13 MED ORDER — ACETAMINOPHEN 325 MG PO TABS: 325.0000 mg | ORAL_TABLET | Freq: Four times a day (QID) | ORAL | Status: DC | PRN

## 2019-01-13 MED ORDER — CEFAZOLIN SODIUM-DEXTROSE 2-3 GM-%(50ML) IV SOLR
INTRAVENOUS | Status: DC | PRN
  Administered 2019-01-13: 2 g via INTRAVENOUS

## 2019-01-13 MED ORDER — DULOXETINE HCL 60 MG PO CPEP
60.0000 mg | ORAL_CAPSULE | Freq: Every day | ORAL | Status: DC
  Administered 2019-01-14 – 2019-01-15 (×2): 60 mg via ORAL
  Filled 2019-01-13 (×2): qty 1

## 2019-01-13 MED ORDER — HYDROMORPHONE HCL 1 MG/ML IJ SOLN
0.2500 mg | INTRAMUSCULAR | Status: DC | PRN
  Administered 2019-01-13 (×4): 0.5 mg via INTRAVENOUS

## 2019-01-13 MED ORDER — SUGAMMADEX SODIUM 200 MG/2ML IV SOLN
INTRAVENOUS | Status: DC | PRN
  Administered 2019-01-13: 150 mg via INTRAVENOUS

## 2019-01-13 MED ORDER — LIDOCAINE 2% (20 MG/ML) 5 ML SYRINGE
INTRAMUSCULAR | Status: DC | PRN
  Administered 2019-01-13: 100 mg via INTRAVENOUS

## 2019-01-13 MED ORDER — BISACODYL 10 MG RE SUPP: 10.0000 mg | Freq: Every day | RECTAL | Status: DC | PRN

## 2019-01-13 MED ORDER — SUCCINYLCHOLINE CHLORIDE 20 MG/ML IJ SOLN
INTRAMUSCULAR | Status: DC | PRN
  Administered 2019-01-13: 140 mg via INTRAVENOUS

## 2019-01-13 MED ORDER — POLYETHYLENE GLYCOL 3350 17 G PO PACK: 17.0000 g | PACK | Freq: Every day | ORAL | Status: DC | PRN

## 2019-01-13 MED ORDER — 0.9 % SODIUM CHLORIDE (POUR BTL) OPTIME
TOPICAL | Status: DC | PRN
  Administered 2019-01-13: 21:00:00 1000 mL

## 2019-01-13 MED ORDER — ACETAMINOPHEN 500 MG PO TABS
ORAL_TABLET | ORAL | Status: AC
  Filled 2019-01-13: qty 1

## 2019-01-13 MED ORDER — PROPOFOL 10 MG/ML IV BOLUS
INTRAVENOUS | Status: AC
  Filled 2019-01-13: qty 20

## 2019-01-13 MED ORDER — POTASSIUM CHLORIDE CRYS ER 20 MEQ PO TBCR
20.0000 meq | EXTENDED_RELEASE_TABLET | Freq: Every day | ORAL | Status: DC
  Administered 2019-01-14 – 2019-01-15 (×2): 20 meq via ORAL
  Filled 2019-01-13 (×2): qty 1

## 2019-01-13 MED ORDER — HYDROXYZINE HCL 25 MG PO TABS: 50.0000 mg | ORAL_TABLET | Freq: Three times a day (TID) | ORAL | Status: DC | PRN

## 2019-01-13 SURGICAL SUPPLY — 51 items
APL SKNCLS STERI-STRIP NONHPOA (GAUZE/BANDAGES/DRESSINGS)
BENZOIN TINCTURE PRP APPL 2/3 (GAUZE/BANDAGES/DRESSINGS) IMPLANT
BIT DRILL CANNULATED (DRILL) IMPLANT
BNDG COHESIVE 6X5 TAN STRL LF (GAUZE/BANDAGES/DRESSINGS) ×1 IMPLANT
BOOTCOVER CLEANROOM LRG (PROTECTIVE WEAR) ×4 IMPLANT
CLOSURE STERI-STRIP 1/4X4 (GAUZE/BANDAGES/DRESSINGS) ×2 IMPLANT
CLSR STERI-STRIP ANTIMIC 1/2X4 (GAUZE/BANDAGES/DRESSINGS) ×1 IMPLANT
COVER PERINEAL POST (MISCELLANEOUS) ×2 IMPLANT
COVER SURGICAL LIGHT HANDLE (MISCELLANEOUS) ×2 IMPLANT
COVER WAND RF STERILE (DRAPES) ×2 IMPLANT
DRAPE STERI IOBAN 125X83 (DRAPES) ×2 IMPLANT
DRILL CANNULATED (DRILL) ×2
DRSG MEPILEX BORDER 4X4 (GAUZE/BANDAGES/DRESSINGS) ×2 IMPLANT
DRSG PAD ABDOMINAL 8X10 ST (GAUZE/BANDAGES/DRESSINGS) IMPLANT
DURAPREP 26ML APPLICATOR (WOUND CARE) ×2 IMPLANT
ELECT REM PT RETURN 9FT ADLT (ELECTROSURGICAL) ×2
ELECTRODE REM PT RTRN 9FT ADLT (ELECTROSURGICAL) ×1 IMPLANT
GLOVE BIOGEL M 6.5 STRL (GLOVE) ×2 IMPLANT
GLOVE BIOGEL PI IND STRL 7.0 (GLOVE) IMPLANT
GLOVE BIOGEL PI INDICATOR 7.0 (GLOVE) ×2
GLOVE BIOGEL PI ORTHO PRO SZ8 (GLOVE) ×2
GLOVE ORTHO TXT STRL SZ7.5 (GLOVE) ×2 IMPLANT
GLOVE PI ORTHO PRO STRL SZ8 (GLOVE) ×2 IMPLANT
GLOVE SURG ORTHO 8.0 STRL STRW (GLOVE) ×4 IMPLANT
GOWN STRL REUS W/ TWL XL LVL3 (GOWN DISPOSABLE) ×1 IMPLANT
GOWN STRL REUS W/TWL 2XL LVL3 (GOWN DISPOSABLE) IMPLANT
GOWN STRL REUS W/TWL XL LVL3 (GOWN DISPOSABLE) ×2
GUIDEWIRE THREAD TIP 3.2X9 (WIRE) ×6 IMPLANT
KIT BASIN OR (CUSTOM PROCEDURE TRAY) ×2 IMPLANT
KIT TURNOVER KIT B (KITS) ×2 IMPLANT
LINER BOOT UNIVERSAL DISP (MISCELLANEOUS) ×2 IMPLANT
MANIFOLD NEPTUNE II (INSTRUMENTS) ×2 IMPLANT
NEEDLE 22X1 1/2 (OR ONLY) (NEEDLE) ×2 IMPLANT
NS IRRIG 1000ML POUR BTL (IV SOLUTION) ×2 IMPLANT
PACK GENERAL/GYN (CUSTOM PROCEDURE TRAY) ×2 IMPLANT
PAD ARMBOARD 7.5X6 YLW CONV (MISCELLANEOUS) ×4 IMPLANT
SCREW CANN RVRS CUT FLT 80X16X (Screw) ×2 IMPLANT
SCREW CANN RVRS CUT FLT 85X16 (Screw) IMPLANT
SCREW CANNULATED 6.5X80MM (Screw) ×4 IMPLANT
SCREW CANNULATED 6.5X85MM (Screw) ×2 IMPLANT
SUT VIC AB 0 CT1 27 (SUTURE) ×2
SUT VIC AB 0 CT1 27XBRD ANBCTR (SUTURE) IMPLANT
SUT VIC AB 2-0 FS1 27 (SUTURE) ×2 IMPLANT
SUT VIC AB 2-0 SH 27 (SUTURE)
SUT VIC AB 2-0 SH 27XBRD (SUTURE) IMPLANT
SUT VIC AB 3-0 SH 27 (SUTURE) ×2
SUT VIC AB 3-0 SH 27X BRD (SUTURE) ×1 IMPLANT
SYR CONTROL 10ML LL (SYRINGE) ×2 IMPLANT
TOWEL OR 17X24 6PK STRL BLUE (TOWEL DISPOSABLE) ×2 IMPLANT
TOWEL OR 17X26 10 PK STRL BLUE (TOWEL DISPOSABLE) ×2 IMPLANT
WATER STERILE IRR 1000ML POUR (IV SOLUTION) ×2 IMPLANT
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
## 2019-01-13 NOTE — Op Note (Signed)
01/13/2019  9:21 PM  PATIENT:  [name]    PRE-OPERATIVE DIAGNOSIS: Right femoral neck basicervical stress fracture, tension side, pathologic due to osteoporosis  POST-OPERATIVE DIAGNOSIS:  Same  PROCEDURE:  CANNULATED HIP PINNING  SURGEON:  Johnny Bridge, MD  PHYSICIAN ASSISTANT: Joya Gaskins, OPA-C, present and scrubbed throughout the case, critical for completion in a timely fashion, and for retraction, instrumentation, and closure.  ANESTHESIA:   General  ESTIMATED BLOOD LOSS: Minimal  PREOPERATIVE INDICATIONS:  [name] is a  38 y.o. female who presented twice to the emergency room with hip pain, ultimately CAT scan diagnosed a nondisplaced basicervical femoral neck fracture.  This appeared more on the tension side than the compression side.  This is likely secondary to vitamin deficiency and osteopenia/osteoporosis.  The risks benefits and alternatives were discussed with the patient preoperatively including but not limited to the risks of infection, bleeding, nerve injury, cardiopulmonary complications, blood clots, malunion, nonunion, avascular necrosis, the need for revision surgery, the potential for conversion to hemiarthroplasty, among others, and the patient was willing to proceed.  OPERATIVE IMPLANTS: Zimmer cannulated screws x 3 6.44m  OPERATIVE FINDINGS: Clinical osteoporosis with weak bone, proximal femur  OPERATIVE PROCEDURE: The patient was brought to the operating room and placed in supine position. IV antibiotics were given. General anesthesia administered. The patient was placed on the fracture table. The operative extremity was positioned, without any significant reduction maneuver and was prepped and draped in usual sterile fashion.  Time out was performed.  Small incision was made distal to the greater trochanter, and 3 guidewires were introduced Into an inverted triangle configuration. The lengths were measured. The reduction was basically  anatomic. I opened the cortex with a cannulated drill, and then placed the screws into position. Satisfactory fixation was achieved.  The wounds were irrigated copiously, and repaired with Vicryl with Steri-Strips and sterile gauze. There no complications and the patient tolerated the procedure well.  The patient will be touch toe weightbearing, and will be on Lovenox for a period of 4 weeks after discharge for DVT prophylaxis.

## 2019-01-13 NOTE — Anesthesia Preprocedure Evaluation (Addendum)
Anesthesia Evaluation  Patient identified by MRN, date of birth, ID band Patient awake    Reviewed: Allergy & Precautions, NPO status , Patient's Chart, lab work & pertinent test results  Airway Mallampati: II  TM Distance: >3 FB    Comment: Prior intubation notes from 2019 reviewed Dental  (+) Teeth Intact, Dental Advisory Given,    Pulmonary asthma , Current Smoker,    Pulmonary exam normal        Cardiovascular hypertension, Pt. on medications Normal cardiovascular exam     Neuro/Psych Seizures -,  Anxiety    GI/Hepatic GERD  Medicated and Controlled,  Endo/Other    Renal/GU      Musculoskeletal   Abdominal   Peds  Hematology   Anesthesia Other Findings   Reproductive/Obstetrics                           Anesthesia Physical Anesthesia Plan  ASA: II  Anesthesia Plan: General   Post-op Pain Management:    Induction: Intravenous  PONV Risk Score and Plan: 2 and Ondansetron and Midazolam  Airway Management Planned: Oral ETT  Additional Equipment:   Intra-op Plan:   Post-operative Plan: Extubation in OR  Informed Consent: I have reviewed the patients History and Physical, chart, labs and discussed the procedure including the risks, benefits and alternatives for the proposed anesthesia with the patient or authorized representative who has indicated his/her understanding and acceptance.       Plan Discussed with: CRNA and Surgeon  Anesthesia Plan Comments:         Anesthesia Quick Evaluation
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
## 2019-01-13 NOTE — Anesthesia Postprocedure Evaluation (Signed)
Anesthesia Post Note  Patient: [name]  Procedure(s) Performed: CANNULATED HIP PINNING (Right Hip)     Patient location during evaluation: PACU Anesthesia Type: General Level of consciousness: awake and alert Pain management: pain level controlled Vital Signs Assessment: post-procedure vital signs reviewed and stable Respiratory status: spontaneous breathing, nonlabored ventilation, respiratory function stable and patient connected to nasal cannula oxygen Cardiovascular status: blood pressure returned to baseline and stable Postop Assessment: no apparent nausea or vomiting Anesthetic complications: no    Last Vitals:  Vitals:   01/13/19 2244 01/13/19 2248  BP: 125/87 120/85  Pulse: 84 72  Resp: (!) 28 11  Temp:    SpO2: 96% 93%    Last Pain:  Vitals:   01/13/19 1716  TempSrc:   PainSc: 9                  Alexcia Schools DAVID

## 2019-01-13 NOTE — Progress Notes (Signed)
Patient with right femoral neck stress fracture, plan for percutaneous pinning later tonight.    The risks benefits and alternatives were discussed with the patient including but not limited to the risks of nonoperative treatment, versus surgical intervention including infection, bleeding, nerve injury, malunion, nonunion, the need for revision surgery, hardware prominence, hardware failure, AVN with the need for arthroplasty the need for hardware removal, blood clots, cardiopulmonary complications, morbidity, mortality, among others, and they were willing to proceed.    Full consult to follow.  Dana Bridge, MD

## 2019-01-13 NOTE — Transfer of Care (Signed)
Immediate Anesthesia Transfer of Care Note  Patient: [name]  Procedure(s) Performed: CANNULATED HIP PINNING (Right Hip)  Patient Location: PACU  Anesthesia Type:General  Level of Consciousness: awake and alert   Airway & Oxygen Therapy: Patient Spontanous Breathing and Patient connected to nasal cannula oxygen  Post-op Assessment: Report given to RN, Post -op Vital signs reviewed and stable and Patient moving all extremities X 4  Post vital signs: Reviewed and stable  Last Vitals:  Vitals Value Taken Time  BP 141/97 01/13/2019  9:46 PM  Temp    Pulse 97 01/13/2019  9:52 PM  Resp 15 01/13/2019  9:52 PM  SpO2 99 % 01/13/2019  9:52 PM  Vitals shown include unvalidated device data.  Last Pain:  Vitals:   01/13/19 1716  TempSrc:   PainSc: 9          Complications: No apparent anesthesia complications
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
## 2019-01-13 NOTE — Discharge Instructions (Signed)
Diet: As you were doing prior to hospitalization   Shower:  May shower but keep the wounds dry, use an occlusive plastic wrap, NO SOAKING IN TUB.  If the bandage gets wet, change with a clean dry gauze.  If you have a splint on, leave the splint in place and keep the splint dry with a plastic bag.  Dressing:  You may change your dressing 3-5 days after surgery, unless you have a splint.  If you have a splint, then just leave the splint in place and we will change your bandages during your first follow-up appointment.    If you had hand or foot surgery, we will plan to remove your stitches in about 2 weeks in the office.  For all other surgeries, there are sticky tapes (steri-strips) on your wounds and all the stitches are absorbable.  Leave the steri-strips in place when changing your dressings, they will peel off with time, usually 2-3 weeks.  Activity:  Increase activity slowly as tolerated, but follow the weight bearing instructions below.  The rules on driving is that you can not be taking narcotics while you drive, and you must feel in control of the vehicle.    Weight Bearing:   Touch toe weight bearing right leg.    To prevent constipation: you may use a stool softener such as -  Colace (over the counter) 100 mg by mouth twice a day  Drink plenty of fluids (prune juice may be helpful) and high fiber foods Miralax (over the counter) for constipation as needed.    Itching:  If you experience itching with your medications, try taking only a single pain pill, or even half a pain pill at a time.  You may take up to 10 pain pills per day, and you can also use benadryl over the counter for itching or also to help with sleep.   Precautions:  If you experience chest pain or shortness of breath - call 911 immediately for transfer to the hospital emergency department!!  If you develop a fever greater that 101 F, purulent drainage from wound, increased redness or drainage from wound, or calf pain --  Call the office at [phone]                                                Follow- Up Appointment:  Please call for an appointment to be seen in 2 weeks [place] - [phone]

## 2019-01-13 NOTE — ED Provider Notes (Signed)
Care handoff received from [facility], PA-C at shift change please see her note for further details.  In short patient with multiple medical problems including history of Crohn's disease, ulcerative colitis, vitamin D deficiency, hypokalemia, chronic joint pain, anemia presenting today for atraumatic right hip pain that has been worsening over the past 1 week she is unable to ambulate due to pain, patient with pain to any movement of the right hip no overlying skin changes no bony tenderness.  ESR negative CRP negative Beta-hCG negative BMP mildly elevated creatinine and decreased CO2, anion gap 16  CBC with leukocytosis of 21.5 with left shift, elevated platelet count at 447  Lactic, blood cultures, COVID-19, LFTs pending.  CT right hip:  IMPRESSION:  1. Acute nondisplaced basicervical femoral neck fracture, likely  stress related given the absence of trauma.  2. 5.0 cm benign-appearing right ovarian cyst. Given the patient's  age, no follow-up is required. This recommendation follows ACR  consensus guidelines: White Paper of the ACR Incidental Findings  Committee II on Adnexal Findings. J Am Coll Radiol [phone].  ----------- Plan of care at handoff is pain control, consultation to orthopedic surgery likely admit to hospitalist service. Physical Exam  BP (!) 141/97   Pulse 88   Temp 98.8 F (37.1 C) (Rectal)   Resp 16   Ht _0  (1.575 m)   Wt 68 kg   LMP 12/20/2018   SpO2 98%   BMI 27.44 kg/m   Physical Exam Constitutional:      General: She is not in acute distress.    Appearance: Normal appearance. She is well-developed. She is not ill-appearing or diaphoretic.  HENT:     Head: Normocephalic and atraumatic.     Right Ear: External ear normal.     Left Ear: External ear normal.     Nose: Nose normal.  Eyes:     General: Vision grossly intact. Gaze aligned appropriately.     Pupils: Pupils are equal, round, and reactive to light.  Neck:   Musculoskeletal: Normal range of motion.     Trachea: Trachea and phonation normal. No tracheal deviation.  Pulmonary:     Effort: Pulmonary effort is normal. No respiratory distress.  Musculoskeletal: Normal range of motion.  Skin:    General: Skin is warm and dry.  Neurological:     Mental Status: She is alert.     GCS: GCS eye subscore is 4. GCS verbal subscore is 5. GCS motor subscore is 6.     Comments: Speech is clear and goal oriented, follows commands Major Cranial nerves without deficit, no facial droop  Psychiatric:        Behavior: Behavior normal.     ED Course/Procedures   Clinical Course as of Jan 13 1647  Sun Jan 13, 2019  1421 Patient verbally verified a safe ride from the ED. Proceeded with prescribing roxicodone for pain/relaxtion/muscle relaxation in the ED.    [AM]  2979 Discussed case with Dr. Francia Greaves, attending physician. Will order basic labs and CT WO contrast of right hip. Pt has chronic leukocytosis but if WBC count is elevated into >20,000, will consider ESR, CRP.    [AM]  1518 Will order ESR, CRP.  WBC(!): 21.5 [AM]    Clinical Course User Index [AM] Albesa Seen, PA-C    Procedures  MDM  Discussed case with orthopedic surgeon Dr. Mardelle Matte who agrees with hospitalist admission pending surgery for tomorrow.  Consult ordered to hospitalist for admission. - Rediscussed case with  Dr. Mardelle Matte, patient's last p.o. intake was some water upon arrival to the ED approximately 1:30 PM, patient denies eating any food today.  Possibility for surgery to be performed tonight, Dr. Mardelle Matte to see patient in preop area. - Patient requesting additional pain medication, 0.5 mg Dilaudid ordered. - Discussed case with hospitalist Dr. Evangeline Gula who will see patient for admission. - Patient has been admitted to hospitalist service for further evaluation and management.   Note: Portions of this report may have been transcribed using voice recognition software. Every  effort was made to ensure accuracy; however, inadvertent computerized transcription errors may still be present.   Gari Crown 01/13/19 1810    Quintella Reichert, MD 01/13/19 1827

## 2019-01-13 NOTE — ED Triage Notes (Signed)
Erline Levine - mother- [phone]
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
## 2019-01-13 NOTE — Anesthesia Procedure Notes (Signed)
Procedure Name: Intubation Date/Time: 01/13/2019 8:27 PM Performed by: Suzy Bouchard, CRNA Pre-anesthesia Checklist: Patient identified, Emergency Drugs available, Suction available, Patient being monitored and Timeout performed Patient Re-evaluated:Patient Re-evaluated prior to induction Oxygen Delivery Method: Circle system utilized Preoxygenation: Pre-oxygenation with 100% oxygen Induction Type: IV induction Laryngoscope Size: Miller and 2 Grade View: Grade I Tube type: Oral Tube size: 7.0 mm Number of attempts: 1 Airway Equipment and Method: Stylet Placement Confirmation: ETT inserted through vocal cords under direct vision,  positive ETCO2 and breath sounds checked- equal and bilateral Secured at: 21 cm Tube secured with: Tape Dental Injury: Teeth and Oropharynx as per pre-operative assessment

## 2019-01-13 NOTE — ED Triage Notes (Signed)
Pt reports joint pain chronically, reports right hip pain since last Saturday and "can't walk because it's grinding and popping". Denies injury.

## 2019-01-14 LAB — BASIC METABOLIC PANEL
Anion gap: 12 (ref 5–15)
BUN: 16 mg/dL (ref 6–20)
CO2: 16 mmol/L — ABNORMAL LOW (ref 22–32)
Calcium: 7.5 mg/dL — ABNORMAL LOW (ref 8.9–10.3)
Chloride: 107 mmol/L (ref 98–111)
Creatinine, Ser: 1.02 mg/dL — ABNORMAL HIGH (ref 0.44–1.00)
GFR calc Af Amer: >60 mL/min (ref >60)
GFR calc non Af Amer: >60 mL/min (ref >60)
Glucose, Bld: 152 mg/dL — ABNORMAL HIGH (ref 70–99)
Potassium: 4.2 mmol/L (ref 3.5–5.1)
Sodium: 135 mmol/L (ref 135–145)

## 2019-01-14 LAB — CBC
HCT: 37.2 % (ref 36.0–46.0)
Hemoglobin: 11.9 g/dL — ABNORMAL LOW (ref 12.0–15.0)
MCH: 28.6 pg (ref 26.0–34.0)
MCHC: 32 g/dL (ref 30.0–36.0)
MCV: 89.4 fL (ref 80.0–100.0)
Platelets: 382 10*3/uL (ref 150–400)
RBC: 4.16 MIL/uL (ref 3.87–5.11)
RDW: 15 % (ref 11.5–15.5)
WBC: 23.8 10*3/uL — ABNORMAL HIGH (ref 4.0–10.5)
nRBC: 0 % (ref 0.0–0.2)

## 2019-01-14 LAB — HIV ANTIBODY (ROUTINE TESTING W REFLEX): HIV Screen 4th Generation wRfx: NONREACTIVE

## 2019-01-14 MED ORDER — CYANOCOBALAMIN (VIT B-12) 1,000 MCG/ML INJECTION SOLUTION
SUBCUTANEOUS | 2 refills | 0.00000 days | Status: CP
Start: 2019-01-14 — End: 2020-01-14

## 2019-01-14 MED ORDER — MIRTAZAPINE 15 MG PO TABS
30.0000 mg | ORAL_TABLET | Freq: Every day | ORAL | Status: DC
  Administered 2019-01-14: 30 mg via ORAL
  Filled 2019-01-14: qty 2

## 2019-01-14 MED ORDER — OXYCODONE HCL 5 MG PO TABS
10.0000 mg | ORAL_TABLET | ORAL | Status: DC | PRN
  Administered 2019-01-14 – 2019-01-15 (×6): 10 mg via ORAL
  Filled 2019-01-14 (×6): qty 2

## 2019-01-14 MED ORDER — OXYCODONE-ACETAMINOPHEN 5-325 MG PO TABS
1.0000 | ORAL_TABLET | Freq: Four times a day (QID) | ORAL | Status: DC | PRN
  Administered 2019-01-14: 1 via ORAL
  Filled 2019-01-14: qty 1

## 2019-01-14 MED ORDER — OXYCODONE HCL 5 MG PO TABS
5.0000 mg | ORAL_TABLET | Freq: Four times a day (QID) | ORAL | Status: DC | PRN
  Administered 2019-01-14: 5 mg via ORAL
  Filled 2019-01-14: qty 1

## 2019-01-14 MED ORDER — MORPHINE SULFATE (PF) 2 MG/ML IV SOLN
2.0000 mg | INTRAVENOUS | Status: DC | PRN
  Administered 2019-01-14 – 2019-01-15 (×6): 2 mg via INTRAVENOUS
  Filled 2019-01-14 (×7): qty 1

## 2019-01-14 MED ORDER — ALBUTEROL SULFATE (2.5 MG/3ML) 0.083% IN NEBU: 2.5000 mg | INHALATION_SOLUTION | RESPIRATORY_TRACT | Status: DC | PRN

## 2019-01-14 MED ORDER — OXYCODONE HCL 5 MG PO TABS: 5.0000 mg | ORAL_TABLET | ORAL | Status: DC | PRN

## 2019-01-14 MED ORDER — MORPHINE SULFATE (PF) 2 MG/ML IV SOLN
2.0000 mg | Freq: Once | INTRAVENOUS | Status: AC
  Administered 2019-01-14: 2 mg via INTRAVENOUS
  Filled 2019-01-14: qty 1

## 2019-01-14 NOTE — Plan of Care (Signed)
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
## 2019-01-14 NOTE — Progress Notes (Signed)
Notified [facility] @0012  about pt request on resuming some of her home medications.

## 2019-01-14 NOTE — Progress Notes (Signed)
Nutrition Brief Note  RD working remotely.  RD consulted for assessment due to hip fracture protocol.  Wt Readings from Last 15 Encounters:  01/13/19 68 kg  10/23/18 63.5 kg  10/21/18 63.5 kg  08/25/18 70.3 kg  01/23/18 63.2 kg  12/19/17 59.1 kg  09/28/17 59 kg  04/18/16 54.4 kg  11/05/13 53.5 kg  10/26/13 52.2 kg  10/11/13 58.7 kg  03/08/13 52.6 kg  03/28/12 56.8 kg  11/02/11 54.9 kg  09/30/11 58.6 kg   [name] is a 38 y.o. female with medical history significant of fibromyalgia, ulcerative colitis, Crohn's disease, anxiety state, polysubstance abuse, vitamin B deficiency, hypokalemia, chronic joint pain, ADHD, presents the ED complaining of right hip pain.  She reports no previous trauma.  She states that hip pain has been insidiously worsening over the past 1 week.  And over the past couple of days has become unbearable.  She is unable to bear any weight on it due to the pain.  No localized erythema or swelling but has felt that there is a "fever in her joint.  She has had no systemic fevers chills or night sweats.  No rashes, no chest pain, no shortness of breath, no cough, no nausea or vomiting.  She has never had any pain like this.  Had no injury or trauma to the hip.  She does not use intravenous drugs.  Emergency department on June 4 x-rays of the hip were negative and see a steroid injection as well as Toradol.  NSAIDs and muscle relaxers were continued at discharge.  She was receiving Mobic.  She continued to have antalgic gait and discomfort and reported back to the emergency department today with complaints of persistent hip pain.  X-rays again repeated were negative CT scan was obtained and showed a likely stress fracture given no history of trauma.  Referred to me for further evaluation and management.  Dr. Mardelle Matte from orthopedics was consulted.  Pt admitted with rt femoral neck   6/7- s/p PROCEDURE:  CANNULATED HIP PINNING  Reviewed I/O's: +1.7 L x 24  hours  Per wt hx, noted mild wt gain over the past year.   Body mass index is 27.44 kg/m. Patient meets criteria for overweight based on current BMI.   Current diet order is regular, patient is consuming approximately 100% of meals at this time. Labs and medications reviewed.   No nutrition interventions warranted at this time. If nutrition issues arise, please consult RD.   [name], RD, LDN, [place] Registered Dietitian II Certified Diabetes Care and Education Specialist Pager: [phone] After hours Pager: [phone]

## 2019-01-14 NOTE — Evaluation (Signed)
Physical Therapy Evaluation Patient Details Name: [name] MRN: [id number] DOB: 09-14-1980 Today's Date: 01/14/2019   History of Present Illness  Patient is a 38 y/o female who presents with right hip fx s/p IM pinning. PMH includes Crohn's disease, HTN, anxiety, migraines, ADHD, ADD, fibromyalgia, tobacco abuse.   Clinical Impression  Patient presents with pain and post surgical deficits s/p above surgery. Pt independent PTA, drives and cooks/cleans- not working. Today, pt tolerated transfers and gait training with min guard-Min a for balance/safety. Limited mainly by pain despite getting premedicated. Education re: TDWB, elevation, exercises, positioning and RW use. Encouraged pt to get OOB for all meals and walk to bathroom with nursing. Will follow acutely to maximize independence and mobility prior to return home. Reports she will have help from mother.     Follow Up Recommendations Follow surgeon's recommendation for DC plan and follow-up therapies;Supervision for mobility/OOB    Equipment Recommendations  Rolling walker with 5" wheels    Recommendations for Other Services       Precautions / Restrictions Precautions Precautions: Fall Restrictions Weight Bearing Restrictions: Yes RLE Weight Bearing: Touchdown weight bearing      Mobility  Bed Mobility Overal bed mobility: Needs Assistance Bed Mobility: Supine to Sit     Supine to sit: Min assist;HOB elevated     General bed mobility comments: Assist with RLE to get to EOB. Increased time.   Transfers Overall transfer level: Needs assistance Equipment used: Rolling walker (2 wheeled) Transfers: Sit to/from Stand Sit to Stand: Min guard         General transfer comment: Min guard for safety and cues for TDWB. Stood from [employer], from toilet x1; transferred to chair post ambulation.  Ambulation/Gait Ambulation/Gait assistance: Min guard Gait Distance (Feet): 20 Feet(x2 bouts) Assistive device: Rolling  walker (2 wheeled) Gait Pattern/deviations: Step-to pattern;Trunk flexed("hop to") Gait velocity: decreased   General Gait Details: Slow, guarded gait; cues for TDWB RLE and increased WB through BUEs. Standing rest break due to fatigue. At times, placing some weight through foot.  Stairs            Wheelchair Mobility    Modified Rankin (Stroke Patients Only)       Balance Overall balance assessment: Mild deficits observed, not formally tested(Able to stand at sink and wash hands maintaining TDWB without UE support.)                                           Pertinent Vitals/Pain Pain Assessment: 0-10 Pain Score: 10-Worst pain ever Pain Location: right hip Pain Descriptors / Indicators: Sore;Operative site guarding Pain Intervention(s): Monitored during session;Repositioned;Premedicated before session;Limited activity within patient's tolerance    Home Living Family/patient expects to be discharged to:: Private residence Living Arrangements: Children;Parent Available Help at Discharge: Family;Available 24 hours/day Type of Home: House Home Access: Stairs to enter Entrance Stairs-Rails: None Entrance Stairs-Number of Steps: 2 Home Layout: One level Home Equipment: None Additional Comments: Mother supportive and in good health; available 24/7. Currently taking care of pt's son    Prior Function Level of Independence: Independent         Comments: Drives, does not work, has 51 y.o. son     Hand Dominance        Extremity/Trunk Assessment   Upper Extremity Assessment Upper Extremity Assessment: Defer to OT evaluation    Lower Extremity Assessment  Lower Extremity Assessment: RLE deficits/detail;LLE deficits/detail RLE Sensation: decreased light touch(foot and sometimes into leg) LLE Sensation: decreased light touch(foot and sometimes into leg)       Communication   Communication: No difficulties  Cognition Arousal/Alertness:  Awake/alert Behavior During Therapy: WFL for tasks assessed/performed Overall Cognitive Status: Within Functional Limits for tasks assessed                                 General Comments: for basic mobility tasks.      General Comments General comments (skin integrity, edema, etc.): VSS throughout    Exercises General Exercises - Lower Extremity Ankle Circles/Pumps: Both;15 reps;Supine Quad Sets: Both;10 reps;Seated   Assessment/Plan    PT Assessment Patient needs continued PT services  PT Problem List Decreased mobility;Pain;Impaired sensation;Decreased balance;Decreased knowledge of use of DME;Decreased activity tolerance;Decreased skin integrity       PT Treatment Interventions DME instruction;Functional mobility training;Balance training;Patient/family education;Gait training;Stair training;Therapeutic exercise;Therapeutic activities    PT Goals (Current goals can be found in the Care Plan section)  Acute Rehab PT Goals Patient Stated Goal: to get this pain better PT Goal Formulation: With patient Time For Goal Achievement: 01/28/19 Potential to Achieve Goals: Good    Frequency Min 5X/week   Barriers to discharge Inaccessible home environment stairs    Co-evaluation               AM-PAC PT "6 Clicks" Mobility  Outcome Measure Help needed turning from your back to your side while in a flat bed without using bedrails?: A Little Help needed moving from lying on your back to sitting on the side of a flat bed without using bedrails?: A Little Help needed moving to and from a bed to a chair (including a wheelchair)?: A Little Help needed standing up from a chair using your arms (e.g., wheelchair or bedside chair)?: A Little Help needed to walk in hospital room?: A Little Help needed climbing 3-5 steps with a railing? : A Lot 6 Click Score: 17    End of Session Equipment Utilized During Treatment: Gait belt Activity Tolerance: Patient tolerated  treatment well Patient left: in chair;with call bell/phone within reach Nurse Communication: Mobility status PT Visit Diagnosis: Pain;Difficulty in walking, not elsewhere classified (R26.2) Pain - Right/Left: Right Pain - part of body: Hip    Time: 0805-0828 PT Time Calculation (min) (ACUTE ONLY): 23 min   Charges:   PT Evaluation $PT Eval Low Complexity: 1 Low PT Treatments $Gait Training: 8-22 mins        Wray Kearns, PT, DPT Acute Rehabilitation Services Pager [phone] Office [phone]      Marguarite Arbour A Sabra Heck 01/14/2019, 8:36 AM

## 2019-01-14 NOTE — Progress Notes (Addendum)
Patient ID: MATILDA FLEIG, female   DOB: 1980/11/06, 38 y.o.   MRN: [id number]     Subjective:  Patient reports pain as mild to moderate.  Patient in bed and in no acute distress.  Denies any CP or SOB  Objective:   VITALS:   Vitals:   01/13/19 2326 01/14/19 0307 01/14/19 0803 01/14/19 1324  BP: 116/80 128/84 126/84 (!) 127/99  Pulse: 81 86 79 76  Resp:   16   Temp: 98.2 F (36.8 C) 98.4 F (36.9 C) 98.3 F (36.8 C) 98.9 F (37.2 C)  TempSrc: Oral Oral Oral Oral  SpO2: 93% 97% 99% 97%  Weight:      Height:        ABD soft Sensation intact distally Dorsiflexion/Plantar flexion intact Incision: dressing C/D/I and no drainage   Lab Results  Component Value Date   WBC 23.8 (H) 01/14/2019   HGB 11.9 (L) 01/14/2019   HCT 37.2 01/14/2019   MCV 89.4 01/14/2019   PLT 382 01/14/2019   BMET    Component Value Date/Time   NA 135 01/14/2019 0526   K 4.2 01/14/2019 0526   CL 107 01/14/2019 0526   CO2 16 (L) 01/14/2019 0526   GLUCOSE 152 (H) 01/14/2019 0526   BUN 16 01/14/2019 0526   CREATININE 1.02 (H) 01/14/2019 0526   CREATININE 0.87 11/05/2013 1244   CALCIUM 7.5 (L) 01/14/2019 0526   CALCIUM (LL) 10/22/2010 0448    5.9 QNS FOR REPEAT CRITICAL RESULT CALLED TO, READ BACK BY AND VERIFIED WITH: MARTIN,M. RN AT 2446 ON 10/25/10 BY GILLESPIE,B.   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   Marchia Bond, MD Cell [phone]

## 2019-01-14 NOTE — Progress Notes (Signed)
  Speech Language Pathology  Patient Details Name: [name] MRN: [id number] DOB: [birth year] Today's Date: 01/14/2019 Time:  -     Verbal order to discontinue bedside swallow evaluation. No difficulty pert RN, intubated during surgery only.                [name] 01/14/2019, 12:44 PM   [name] [name].Ed Risk analyst [phone] Office [phone]

## 2019-01-14 NOTE — Progress Notes (Signed)
CSW acknowledges SNF consult, appears patient plans to go home with 24/7 assistance from family including her mother and children.   Please notify CSW should plans change.  [place], [place]

## 2019-01-14 NOTE — Progress Notes (Signed)
Inpatient Rehab Admissions:  Inpatient Rehab Consult received.  I met with patient at the bedside for rehabilitation assessment and to discuss goals and expectations of an inpatient rehab admission.  Pt currently performing mobility with min guard and wanting to d/c home with family support and home health therapies.  I will sign off at this time.   Signed: Shann Medal, PT, DPT Admissions Coordinator [phone] 01/14/19  1:21 PM

## 2019-01-14 NOTE — Progress Notes (Signed)
Notified [facility] about pt request on resuming her Protonix 40mg  starting this morning.

## 2019-01-15 ENCOUNTER — Encounter (HOSPITAL_COMMUNITY): Payer: Self-pay | Admitting: Orthopedic Surgery

## 2019-01-15 DIAGNOSIS — G8929 Other chronic pain: Secondary | ICD-10-CM

## 2019-01-15 DIAGNOSIS — E538 Deficiency of other specified B group vitamins: Secondary | ICD-10-CM

## 2019-01-15 DIAGNOSIS — M549 Dorsalgia, unspecified: Secondary | ICD-10-CM

## 2019-01-15 DIAGNOSIS — F411 Generalized anxiety disorder: Secondary | ICD-10-CM

## 2019-01-15 DIAGNOSIS — M797 Fibromyalgia: Secondary | ICD-10-CM

## 2019-01-15 LAB — CBC
HCT: 37.9 % (ref 36.0–46.0)
Hemoglobin: 11.9 g/dL — ABNORMAL LOW (ref 12.0–15.0)
MCH: 28.7 pg (ref 26.0–34.0)
MCHC: 31.4 g/dL (ref 30.0–36.0)
MCV: 91.5 fL (ref 80.0–100.0)
Platelets: 377 10*3/uL (ref 150–400)
RBC: 4.14 MIL/uL (ref 3.87–5.11)
RDW: 15.2 % (ref 11.5–15.5)
WBC: 16.5 10*3/uL — ABNORMAL HIGH (ref 4.0–10.5)
nRBC: 0 % (ref 0.0–0.2)

## 2019-01-15 LAB — BASIC METABOLIC PANEL
Anion gap: 11 (ref 5–15)
BUN: 15 mg/dL (ref 6–20)
CO2: 19 mmol/L — ABNORMAL LOW (ref 22–32)
Calcium: 7.6 mg/dL — ABNORMAL LOW (ref 8.9–10.3)
Chloride: 110 mmol/L (ref 98–111)
Creatinine, Ser: 0.95 mg/dL (ref 0.44–1.00)
GFR calc Af Amer: >60 mL/min (ref >60)
GFR calc non Af Amer: >60 mL/min (ref >60)
Glucose, Bld: 140 mg/dL — ABNORMAL HIGH (ref 70–99)
Potassium: 3.5 mmol/L (ref 3.5–5.1)
Sodium: 140 mmol/L (ref 135–145)

## 2019-01-15 MED ORDER — POTASSIUM CHLORIDE CRYS ER 20 MEQ PO TBCR: 20.0000 meq | EXTENDED_RELEASE_TABLET | Freq: Every day | ORAL | 0 refills | Status: DC

## 2019-01-15 MED ORDER — FERROUS SULFATE 325 (65 FE) MG PO TABS: 325.0000 mg | ORAL_TABLET | Freq: Three times a day (TID) | ORAL | 0 refills | Status: DC

## 2019-01-15 MED FILL — ENOXAPARIN 40 MG/0.4 ML SYR: 40 | 30 days supply | Qty: 12 | Fill #0

## 2019-01-15 MED FILL — oxyCODONE HCL 5 MG TABS: 5 | 5 days supply | Qty: 30 | Fill #0

## 2019-01-15 NOTE — Progress Notes (Signed)
AVS given and reviewed with pt. Medications discussed and reviewed. Rolling walker delivered to bedside. Pt given paperwork to pick up 3in1 and shower seat. All questions answered to satisfaction. Pt verbalized understanding of information given. Pt escorted off the unit via wheelchair by staff member.

## 2019-01-15 NOTE — Plan of Care (Signed)
Problem: Education: Goal: Knowledge of General Education information will improve Description Including pain rating scale, medication(s)/side effects and non-pharmacologic comfort measures Outcome: Progressing   Problem: Health Behavior/Discharge Planning: Goal: Ability to manage health-related needs will improve Outcome: Progressing   Problem: Clinical Measurements: Goal: Ability to maintain clinical measurements within normal limits will improve Outcome: Progressing Goal: Will remain free from infection Outcome: Progressing Goal: Respiratory complications will improve Outcome: Progressing Goal: Cardiovascular complication will be avoided Outcome: Progressing   Problem: Activity: Goal: Risk for activity intolerance will decrease Outcome: Progressing   Problem: Nutrition: Goal: Adequate nutrition will be maintained Outcome: Progressing   Problem: Coping: Goal: Level of anxiety will decrease Outcome: Progressing   Problem: Elimination: Goal: Will not experience complications related to urinary retention Outcome: Progressing   Problem: Pain Managment: Goal: General experience of comfort will improve Outcome: Progressing   Problem: Skin Integrity: Goal: Risk for impaired skin integrity will decrease Outcome: Progressing   Problem: Activity: Goal: Ability to ambulate and perform ADLs will improve Outcome: Progressing   Problem: Clinical Measurements: Goal: Postoperative complications will be avoided or minimized Outcome: Progressing   Problem: Self-Concept: Goal: Ability to maintain and perform role responsibilities to the fullest extent possible will improve Outcome: Progressing

## 2019-01-15 NOTE — Discharge Summary (Signed)
Physician Discharge Summary  [name] EAV:[id number] DOB: Jun 09, 1981 DOA: 01/13/2019  PCP: Patient, [name]  Admit date: 01/13/2019 Discharge date: 01/15/2019  Admitted From: Home  Disposition: Home  Recommendations for Outpatient Follow-up:  1. Follow up with PCP/surgery in 1-2 weeks as scheduled  2. Please obtain BMP/CBC in one week  Home Health: No Discharge Condition: Stable CODE STATUS: Full Diet recommendation: As tolerated  Brief/Interim Summary: [name] is a 38 y.o. female with medical history significant of fibromyalgia, ulcerative colitis, Crohn's disease, anxiety state, polysubstance abuse, vitamin B deficiency, hypokalemia, chronic joint pain, ADHD, presents the ED complaining of right hip pain.  She reports no previous trauma.  She states that hip pain has been insidiously worsening over the past 1 week.  And over the past couple of days has become unbearable.  She is unable to bear any weight on it due to the pain.  No localized erythema or swelling but has felt that there is a "fever in her joint.  She has had no systemic fevers chills or night sweats.  No rashes, no chest pain, no shortness of breath, no cough, no nausea or vomiting.  She has never had any pain like this.  Had no injury or trauma to the hip.  She does not use intravenous drugs.  Emergency department on June 4 x-rays of the hip were negative and see a steroid injection as well as Toradol.  NSAIDs and muscle relaxers were continued at discharge.  She was receiving Mobic.  She continued to have antalgic gait and discomfort and reported back to the emergency department today with complaints of persistent hip pain.  X-rays again repeated were negative CT scan was obtained and showed a likely stress fracture given no history of trauma.  Referred to me for further evaluation and management.  Dr. Mardelle Matte from orthopedics was consulted.  She admitted as above with atypical hip fracture without history of  trauma or inciting event requiring ORIF with orthopedic surgery.  Patient recovered from surgery quite well, now tolerating chin with minimal assistance and pain.  Patient was evaluated for inpatient rehab versus placement however given PT recommendations patient is otherwise stable for discharge home with doing self therapy.  See discussion at bedside daily with patient about etiology of what appears to be a spontaneous hip fracture, concern for malnutrition given patient's history of UC as well as Crohn's disease -recommended increased p.o. intake, supplementation and vitamins as well as calcium and vitamin D over-the-counter.  Patient will need close follow-up with PCP and GI for further evaluation and treatment.  Patient's other chronic comorbid conditions as below are otherwise stable patient otherwise agreeable for discharge.  Patient admitted as above with atypical hip fracture without history of trauma.  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 Mardelle Matte from orthopedics following, we appreciate insight and recommendations  Patient with good pain control, no further deficits Clear etiology for femur fracture, patient declines trauma, falls, strenuous activity. Concern that patient's malnutrition may have played a role in the fracture, follow outpatient with PCP for further testing including vitamin D, DEXA scan  or other screening for osteoporosis  Crohn's disease of small intestine with history of regional enteritis of small intestine with large intestine:  Patient states she has both ulcerative colitis and Crohn's  disease.   She is followed by GI.   She uses Stelara every month.   Leukocytosis, resolving Questionably reactive in the setting of procedure however patient is on Stelara which has known side effect of lymphocytosis -continue follow with morning labs Unclear if patient previously on steroids in the outpatient setting, patient declines any recent steroids to the best of her memory   Fibromyalgia:  Continue home medication management.  Anxiety state:  Continue 3 times daily hydroxyzine.  Essential hypertension:  Currently not on any antihypertensive medications and blood pressures are stable.  Continue to monitor.  B12 deficiency: Patient uses a cyanocobalamin injection monthly.  Chronic lumbago:  Review of medication shows that she has been taking both ibuprofen and meloxicam well as Tylenol. She has been counseled against using both meloxicam and ibuprofen.  History of chronic narcotic dependence:  Patient currently in remission.  Discharge Instructions Follow-up with PCP, GI, Ortho as scheduled  Discharge Instructions    Diet - low sodium heart healthy   Complete by:  As directed    Increase activity slowly   Complete by:  As directed    Touch down weight bearing   Complete by:  As directed      Allergies as of 01/15/2019      Reactions   Penicillins Hives   Has patient had a PCN reaction causing immediate rash, facial/tongue/throat swelling, SOB or lightheadedness with hypotension: Yes Has patient had a PCN reaction causing severe rash involving mucus membranes or skin necrosis: Unk Has patient had a PCN reaction that required hospitalization: Yes Has patient had a PCN reaction occurring within the last 10 years: No If all of the above answers are "NO", then may proceed with Cephalosporin use.   Doxycycline Other (See Comments)   Reaction not recalled      Medication List    STOP taking these medications   acetaminophen 325 MG tablet Commonly known as:   TYLENOL   ibuprofen 200 MG tablet Commonly known as:  ADVIL   meloxicam 7.5 MG tablet Commonly known as:  MOBIC     TAKE these medications   cyanocobalamin 1000 MCG/ML injection Commonly known as:  (VITAMIN B-12) Inject 1,000 mcg into the skin every 30 (thirty) days.   diphenoxylate-atropine 2.5-0.025 MG tablet Commonly known as:  LOMOTIL Take 1 tablet by mouth as needed for diarrhea or loose stools.   DULoxetine 60 MG capsule Commonly known as:  CYMBALTA Take 60 mg by mouth daily.   enoxaparin 40 MG/0.4ML injection Commonly known as:  Lovenox Inject 0.4 mLs (40 mg total) into the skin daily.   ferrous sulfate 325 (65 FE) MG tablet Take 1 tablet (325 mg total) by mouth 3 (three) times daily after meals for 30 days.   hydrOXYzine 50 MG tablet Commonly known as:  ATARAX/VISTARIL Take 50 mg by mouth 3 (three) times daily as needed.   mirtazapine 30 MG tablet Commonly known as:  REMERON Take 30 mg by mouth at bedtime.   ondansetron 8 MG disintegrating tablet Commonly known as:  ZOFRAN-ODT Take 1 tablet by mouth every 8 (eight) hours as needed for nausea.   oxyCODONE 5 MG immediate release tablet Commonly known as:  Roxicodone Take 1 tablet (5 mg total) by mouth every 4 (four) hours as needed for severe pain.  pantoprazole 40 MG tablet Commonly known as:  PROTONIX Take 1 tablet (40 mg total) by mouth daily. What changed:  when to take this   potassium chloride SA 20 MEQ tablet Commonly known as:  K-DUR Take 1 tablet (20 mEq total) by mouth daily for 30 days. Start taking on:  January 16, 2019   sennosides-docusate sodium 8.6-50 MG tablet Commonly known as:  SENOKOT-S Take 2 tablets by mouth daily.   ustekinumab 90 MG/ML Sosy injection Commonly known as:  STELARA Inject 1 mL into the skin every 8 (eight) weeks.            Durable Medical Equipment  (From admission, onward)         Start     Ordered   01/14/19 0956  For home use only DME Walker rolling   Once    Question:  Patient needs a walker to treat with the following condition  Answer:  S/P right hip fracture   01/14/19 0959           Discharge Care Instructions  (From admission, onward)         Start     Ordered   01/13/19 0000  Touch down weight bearing     01/13/19 2013         Follow-up Information    Marchia Bond, MD. Schedule an appointment as soon as possible for a visit in 2 weeks.   Specialty:  Orthopedic Surgery Contact information: [address] [place] [address] [phone]          Allergies  Allergen Reactions  . Penicillins Hives    Has patient had a PCN reaction causing immediate rash, facial/tongue/throat swelling, SOB or lightheadedness with hypotension: Yes Has patient had a PCN reaction causing severe rash involving mucus membranes or skin necrosis: Unk Has patient had a PCN reaction that required hospitalization: Yes Has patient had a PCN reaction occurring within the last 10 years: No If all of the above answers are "NO", then may proceed with Cephalosporin use.     Marland Kitchen Doxycycline Other (See Comments)    Reaction not recalled    Consultations:  Ortho   Procedures/Studies: Pelvis Portable  Result Date: 01/13/2019 CLINICAL DATA:  38 year old female status post ORIF of the right hip. EXAM: PORTABLE PELVIS 1-2 VIEWS COMPARISON:  01/13/2019. FINDINGS: Three fixation screws traverse the previously noted basicervical femoral neck fracture. Alignment appears anatomic. Bony pelvis is otherwise unremarkable. IMPRESSION: 1. Status post ORIF in right hip without complicating features, as above. Electronically Signed   By: Vinnie Langton M.D.   On: 01/13/2019 22:19   Ct Hip Right Wo Contrast  Result Date: 01/13/2019 CLINICAL DATA:  Chronic right hip pain, worsened over the past week. EXAM: CT OF THE RIGHT HIP WITHOUT CONTRAST TECHNIQUE: Multidetector CT imaging of the right hip was performed according to the standard  protocol. Multiplanar CT image reconstructions were also generated. COMPARISON:  Right hip x-rays dated January 10, 2019. CT abdomen pelvis dated August 25, 2018. FINDINGS: Bones/Joint/Cartilage Acute nondisplaced basicervical femoral neck fracture. No dislocation. The right hip joint space is preserved. No joint effusion. Ligaments Ligaments are suboptimally evaluated by CT. Muscles and Tendons Grossly intact.  No muscle atrophy. Soft tissue No fluid collection or hematoma. No soft tissue mass. 5.0 cm right ovarian cyst. IMPRESSION: 1. Acute nondisplaced basicervical femoral neck fracture, likely stress related given the absence of trauma. 2. 5.0 cm benign-appearing right ovarian cyst. Given the patient's age, no  follow-up is required. This recommendation follows ACR consensus guidelines: White Paper of the ACR Incidental Findings Committee II on Adnexal Findings. J Am Coll Radiol [phone]. Electronically Signed   By: Titus Dubin M.D.   On: 01/13/2019 16:10   Dg C-arm 1-60 Min  Result Date: 01/13/2019 CLINICAL DATA:  Intraoperative fluoroscopic views for ORIF of right hip fracture. EXAM: OPERATIVE RIGHT HIP (WITH PELVIS IF PERFORMED) 3 VIEWS TECHNIQUE: Fluoroscopic spot image(s) were submitted for interpretation post-operatively. COMPARISON:  Prior CT from earlier the same day. FINDINGS: Three spot intraoperative AP and lateral views of the right hip from ORIF of right hip fracture seen. Placement of 3 cannulated lag fixation screws into the right femoral head and neck. Hardware appears well aligned without complication. No other new osseous abnormality. IMPRESSION: Intraoperative fluoroscopic views for ORIF of right hip fracture. No complication. Electronically Signed   By: Jeannine Boga M.D.   On: 01/13/2019 21:39   Dg Hip Operative Unilat W Or W/o Pelvis Right  Result Date: 01/13/2019 CLINICAL DATA:  Intraoperative fluoroscopic views for ORIF of right hip fracture. EXAM: OPERATIVE RIGHT HIP  (WITH PELVIS IF PERFORMED) 3 VIEWS TECHNIQUE: Fluoroscopic spot image(s) were submitted for interpretation post-operatively. COMPARISON:  Prior CT from earlier the same day. FINDINGS: Three spot intraoperative AP and lateral views of the right hip from ORIF of right hip fracture seen. Placement of 3 cannulated lag fixation screws into the right femoral head and neck. Hardware appears well aligned without complication. No other new osseous abnormality. IMPRESSION: Intraoperative fluoroscopic views for ORIF of right hip fracture. No complication. Electronically Signed   By: Jeannine Boga M.D.   On: 01/13/2019 21:39   Dg Hip Unilat With Pelvis 2-3 Views Right  Result Date: 01/10/2019 CLINICAL DATA:  Right hip pain. EXAM: DG HIP (WITH OR WITHOUT PELVIS) 2-3V RIGHT COMPARISON:  CT abdomen pelvis dated August 25, 2018. FINDINGS: There is no evidence of hip fracture or dislocation. There is no evidence of arthropathy or other focal bone abnormality. IMPRESSION: Negative. Electronically Signed   By: Titus Dubin M.D.   On: 01/10/2019 14:39     Subjective: No acute issues or events overnight, tolerating p.o. well, ambulating without difficulty or pain.  Otherwise requesting discharge home   Discharge Exam: Vitals:   01/15/19 0412 01/15/19 0744  BP: (!) 134/92 113/82  Pulse: 70 74  Resp: 16 16  Temp: (!) 97.3 F (36.3 C) 97.8 F (36.6 C)  SpO2: 98% 96%   Vitals:   01/14/19 1324 01/14/19 1954 01/15/19 0412 01/15/19 0744  BP: (!) 127/99 131/87 (!) 134/92 113/82  Pulse: 76 85 70 74  Resp:  18 16 16   Temp: 98.9 F (37.2 C) 98.2 F (36.8 C) (!) 97.3 F (36.3 C) 97.8 F (36.6 C)  TempSrc: Oral Oral Oral Oral  SpO2: 97% 96% 98% 96%  Weight:      Height:        General: Pt is alert, awake, not in acute distress Cardiovascular: RRR, S1/S2 +, no rubs, no gallops Respiratory: CTA bilaterally, no wheezing, no rhonchi Abdominal: Soft, NT, ND, bowel sounds + Extremities: no edema, no  cyanosis    The results of significant diagnostics from this hospitalization (including imaging, microbiology, ancillary and laboratory) are listed below for reference.     Microbiology: Recent Results (from the past 240 hour(s))  Culture, blood (routine x 2)     Status: None (Preliminary result)   Collection Time: 01/13/19  4:38 PM  Result Value Ref  Range Status   Specimen Description BLOOD LEFT ANTECUBITAL  Final   Special Requests   Final    BOTTLES DRAWN AEROBIC AND ANAEROBIC Blood Culture results may not be optimal due to an inadequate volume of blood received in culture bottles   Culture   Final    NO GROWTH 2 DAYS Performed at [facility] [address], [place], [place] [address]    Report Status PENDING  Incomplete  Culture, blood (routine x 2)     Status: None (Preliminary result)   Collection Time: 01/13/19  5:14 PM  Result Value Ref Range Status   Specimen Description BLOOD RIGHT ARM  Final   Special Requests   Final    BOTTLES DRAWN AEROBIC AND ANAEROBIC Blood Culture adequate volume   Culture   Final    NO GROWTH 2 DAYS Performed at [facility], [address], [place],  [address]    Report Status PENDING  Incomplete  SARS Coronavirus 2 (CEPHEID - Performed in [place] [facility] hospital lab), Hosp Order     Status: None   Collection Time: 01/13/19  5:37 PM  Result Value Ref Range Status   SARS Coronavirus 2 NEGATIVE NEGATIVE Final    Comment: (NOTE) If result is NEGATIVE SARS-CoV-2 target nucleic acids are NOT DETECTED. The SARS-CoV-2 RNA is generally detectable in upper and lower  respiratory specimens during the acute phase of infection. The lowest  concentration of SARS-CoV-2 viral copies this assay can detect is 250  copies / mL. A negative result does not preclude SARS-CoV-2 infection  and should not be used as the sole basis for treatment or other  patient management decisions.  A negative result may occur with  improper specimen  collection / handling, submission of specimen other  than nasopharyngeal swab, presence of viral mutation(s) within the  areas targeted by this assay, and inadequate number of viral copies  (<250 copies / mL). A negative result must be combined with clinical  observations, patient history, and epidemiological information. If result is POSITIVE SARS-CoV-2 target nucleic acids are DETECTED. The SARS-CoV-2 RNA is generally detectable in upper and lower  respiratory specimens dur ing the acute phase of infection.  Positive  results are indicative of active infection with SARS-CoV-2.  Clinical  correlation with patient history and other diagnostic information is  necessary to determine patient infection status.  Positive results do  not rule out bacterial infection or co-infection with other viruses. If result is PRESUMPTIVE POSTIVE SARS-CoV-2 nucleic acids MAY BE PRESENT.   A presumptive positive result was obtained on the submitted specimen  and confirmed on repeat testing.  While 2019 novel coronavirus  (SARS-CoV-2) nucleic acids may be present in the submitted sample  additional confirmatory testing may be necessary for epidemiological  and / or clinical management purposes  to differentiate between  SARS-CoV-2 and other Sarbecovirus currently known to infect humans.  If clinically indicated additional testing with an alternate test  methodology [phone]) is advised. The SARS-CoV-2 RNA is generally  detectable in upper and lower respiratory sp ecimens during the acute  phase of infection. The expected result is Negative. Fact Sheet for Patients:  StrictlyIdeas.no Fact Sheet for Healthcare Providers: BankingDealers.co.za This test is not yet approved or cleared by the [place] FDA and has been authorized for detection and/or diagnosis of SARS-CoV-2 by FDA under an Emergency Use Authorization (EUA).  This EUA will remain in effect  (meaning this test can be used) for the duration  of the COVID-19 declaration under Section 564(b)(1) of the Act, 21 U.S.C. section 360bbb-3(b)(1), unless the authorization is terminated or revoked sooner. Performed at [facility], [place] [address], [place], [place] [address]     Labs: BNP (last 3 results) No results for input(s): BNP in the last 8760 hours.   Basic Metabolic Panel: Recent Labs  Lab 01/13/19 1431 01/13/19 1915 01/14/19 0526 01/15/19 0557  NA 138  --  135 140  K 3.8  --  4.2 3.5  CL 107  --  107 110  CO2 15*  --  16* 19*  GLUCOSE 87  --  152* 140*  BUN 19  --  16 15  CREATININE 1.17* 1.17* 1.02* 0.95  CALCIUM 8.9  --  7.5* 7.6*   Liver Function Tests: Recent Labs  Lab 01/13/19 1431  AST 15  ALT 11  ALKPHOS 95  BILITOT 0.7  PROT 7.1  ALBUMIN 4.0   No results for input(s): LIPASE, AMYLASE in the last 168 hours. No results for input(s): AMMONIA in the last 168 hours. CBC: Recent Labs  Lab 01/13/19 1431 01/13/19 1915 01/14/19 0526 01/15/19 0557  WBC 21.5* 18.4* 23.8* 16.5*  NEUTROABS 15.4*  --   --   --   HGB 14.1 13.1 11.9* 11.9*  HCT 44.5 41.3 37.2 37.9  MCV 88.6 88.8 89.4 91.5  PLT 447* 411* 382 377   Cardiac Enzymes: No results for input(s): CKTOTAL, CKMB, CKMBINDEX, TROPONINI in the last 168 hours. BNP: Invalid input(s): POCBNP CBG: No results for input(s): GLUCAP in the last 168 hours. D-Dimer No results for input(s): DDIMER in the last 72 hours. Hgb A1c No results for input(s): HGBA1C in the last 72 hours. Lipid Profile No results for input(s): CHOL, HDL, LDLCALC, TRIG, CHOLHDL, LDLDIRECT in the last 72 hours. Thyroid function studies No results for input(s): TSH, T4TOTAL, T3FREE, THYROIDAB in the last 72 hours.  Invalid input(s): FREET3 Anemia work up No results for input(s): VITAMINB12, FOLATE, FERRITIN, TIBC, IRON, RETICCTPCT in the last 72 hours. Urinalysis    Component Value Date/Time   COLORURINE STRAW (A)  10/20/2018 1238   APPEARANCEUR CLEAR 10/20/2018 1238   LABSPEC 1.009 10/20/2018 1238   PHURINE 6.0 10/20/2018 1238   GLUCOSEU NEGATIVE 10/20/2018 1238   HGBUR NEGATIVE 10/20/2018 1238   BILIRUBINUR NEGATIVE 10/20/2018 1238   KETONESUR NEGATIVE 10/20/2018 1238   PROTEINUR NEGATIVE 10/20/2018 1238   UROBILINOGEN 0.2 10/22/2010 0038   NITRITE NEGATIVE 10/20/2018 1238   LEUKOCYTESUR NEGATIVE 10/20/2018 1238   Sepsis Labs Invalid input(s): PROCALCITONIN,  WBC,  LACTICIDVEN Microbiology Recent Results (from the past 240 hour(s))  Culture, blood (routine x 2)     Status: None (Preliminary result)   Collection Time: 01/13/19  4:38 PM  Result Value Ref Range Status   Specimen Description BLOOD LEFT ANTECUBITAL  Final   Special Requests   Final    BOTTLES DRAWN AEROBIC AND ANAEROBIC Blood Culture results may not be optimal due to an inadequate volume of blood received in culture bottles   Culture   Final    NO GROWTH 2 DAYS Performed at [facility], [place] [address], [place], [place] [address]    Report Status PENDING  Incomplete  Culture, blood (routine x 2)     Status: None (Preliminary result)   Collection Time: 01/13/19  5:14 PM  Result Value Ref Range Status   Specimen Description BLOOD RIGHT ARM  Final   Special Requests   Final  BOTTLES DRAWN AEROBIC AND ANAEROBIC Blood Culture adequate volume   Culture   Final    NO GROWTH 2 DAYS Performed at [facility], [place] [address] [place], [place] [address]    Report Status PENDING  Incomplete  SARS Coronavirus 2 (CEPHEID - Performed in [facility] hospital lab), Hosp Order     Status: None   Collection Time: 01/13/19  5:37 PM  Result Value Ref Range Status   SARS Coronavirus 2 NEGATIVE NEGATIVE Final    Comment: (NOTE) If result is NEGATIVE SARS-CoV-2 target nucleic acids are NOT DETECTED. The SARS-CoV-2 RNA is generally detectable in upper and lower  respiratory specimens during the acute phase of infection. The  lowest  concentration of SARS-CoV-2 viral copies this assay can detect is 250  copies / mL. A negative result does not preclude SARS-CoV-2 infection  and should not be used as the sole basis for treatment or other  patient management decisions.  A negative result may occur with  improper specimen collection / handling, submission of specimen other  than nasopharyngeal swab, presence of viral mutation(s) within the  areas targeted by this assay, and inadequate number of viral copies  (<250 copies / mL). A negative result must be combined with clinical  observations, patient history, and epidemiological information. If result is POSITIVE SARS-CoV-2 target nucleic acids are DETECTED. The SARS-CoV-2 RNA is generally detectable in upper and lower  respiratory specimens dur ing the acute phase of infection.  Positive  results are indicative of active infection with SARS-CoV-2.  Clinical  correlation with patient history and other diagnostic information is  necessary to determine patient infection status.  Positive results do  not rule out bacterial infection or co-infection with other viruses. If result is PRESUMPTIVE POSTIVE SARS-CoV-2 nucleic acids MAY BE PRESENT.   A presumptive positive result was obtained on the submitted specimen  and confirmed on repeat testing.  While 2019 novel coronavirus  (SARS-CoV-2) nucleic acids may be present in the submitted sample  additional confirmatory testing may be necessary for epidemiological  and / or clinical management purposes  to differentiate between  SARS-CoV-2 and other Sarbecovirus currently known to infect humans.  If clinically indicated additional testing with an alternate test  methodology [phone]) is advised. The SARS-CoV-2 RNA is generally  detectable in upper and lower respiratory sp ecimens during the acute  phase of infection. The expected result is Negative. Fact Sheet for Patients:   StrictlyIdeas.no Fact Sheet for Healthcare Providers: BankingDealers.co.za This test is not yet approved or cleared by the [place] FDA and has been authorized for detection and/or diagnosis of SARS-CoV-2 by FDA under an Emergency Use Authorization (EUA).  This EUA will remain in effect (meaning this test can be used) for the duration of the COVID-19 declaration under Section 564(b)(1) of the Act, 21 U.S.C. section 360bbb-3(b)(1), unless the authorization is terminated or revoked sooner. Performed at [facility], [place] [address] [place], [place] [address]      Time coordinating discharge: Over 30 minutes  SIGNED:  Little Ishikawa, DO [facility] 01/15/2019, 2:06 PM Pager   If 7PM-7AM, please contact night-coverage [email or web address] Password [email or web address]

## 2019-01-15 NOTE — Progress Notes (Signed)
Orthopedic Tech Progress Note Patient Details:  [name] [birth year] [id number] I applied Over Head Frame with Trapeze for patient  Patient ID: [name], female   DOB: 15-Aug-1980, 38 y.o.   MRN: [id number]   Janit Pagan 01/15/2019, 8:14 AM

## 2019-01-15 NOTE — Progress Notes (Signed)
Physical Therapy Treatment Patient Details Name: [name] MRN: [id number] DOB: Aug 08, 1981 Today's Date: 01/15/2019    History of Present Illness Patient is a 38 y/o female who presents with right hip fx s/p IM pinning. PMH includes Crohn's disease, HTN, anxiety, migraines, ADHD, ADD, fibromyalgia, tobacco abuse.     PT Comments    Patient progressing well towards PT goals. Improved ambulation distance with Min guard assist for balance/safety. Requires frequent rest breaks due to fatigue. Instructed pt in there ex program for home. Discussed elevation, ice, positioning and mobility recommendations for home. Answered all questions. Pt has low toilets at home so recommend BSC.  Able to maintain TDWB throughout session without cues. Eager to return home with support of mom. Will follow.   Follow Up Recommendations  Follow surgeon's recommendation for DC plan and follow-up therapies;Supervision for mobility/OOB     Equipment Recommendations  3in1 (PT)    Recommendations for Other Services       Precautions / Restrictions Precautions Precautions: Fall Restrictions Weight Bearing Restrictions: Yes RLE Weight Bearing: Touchdown weight bearing    Mobility  Bed Mobility Overal bed mobility: Needs Assistance Bed Mobility: Supine to Sit;Sit to Supine     Supine to sit: Supervision;HOB elevated Sit to supine: Supervision;HOB elevated   General bed mobility comments: Able to get to EOB using UEs to move RLE to EOB and to return to supine. HOB elevated.  Transfers Overall transfer level: Needs assistance Equipment used: Rolling walker (2 wheeled) Transfers: Sit to/from Stand Sit to Stand: Min guard         General transfer comment: Min guard for safety; stood from EOB x1. Able to maintain TDWB RLE during transfers.   Ambulation/Gait Ambulation/Gait assistance: Min guard Gait Distance (Feet): 60 Feet Assistive device: Rolling walker (2 wheeled) Gait Pattern/deviations:  Step-to pattern;Trunk flexed("hop to") Gait velocity: decreased   General Gait Details: Hop to gait with increased WB through [place]. Standing rest breaks due to fatigue. Cues for RW management as pt tends to pick up RW. Maintained TDWB throughout.   Stairs             Wheelchair Mobility    Modified Rankin (Stroke Patients Only)       Balance Overall balance assessment: Mild deficits observed, not formally tested                                          Cognition Arousal/Alertness: Awake/alert Behavior During Therapy: WFL for tasks assessed/performed Overall Cognitive Status: Within Functional Limits for tasks assessed                                        Exercises General Exercises - Lower Extremity Ankle Circles/Pumps: Both;15 reps;Supine Quad Sets: Both;10 reps;AROM;Supine Long Arc Quad: AROM;10 reps;Right;Seated Heel Slides: AROM;Right;10 reps;Supine Straight Leg Raises: AROM;Right;5 reps;Supine    General Comments General comments (skin integrity, edema, etc.): 1 LOB when turning around to reach for something on her bed but able to catch self      Pertinent Vitals/Pain Pain Assessment: 0-10 Pain Score: 8  Pain Location: right hip Pain Descriptors / Indicators: Sore;Operative site guarding Pain Intervention(s): Monitored during session;Repositioned;Premedicated before session    Home Living  Prior Function            PT Goals (current goals can now be found in the care plan section) Progress towards PT goals: Progressing toward goals    Frequency    Min 5X/week      PT Plan Current plan remains appropriate    Co-evaluation              AM-PAC PT "6 Clicks" Mobility   Outcome Measure  Help needed turning from your back to your side while in a flat bed without using bedrails?: A Little Help needed moving from lying on your back to sitting on the side of a flat bed  without using bedrails?: A Little Help needed moving to and from a bed to a chair (including a wheelchair)?: A Little Help needed standing up from a chair using your arms (e.g., wheelchair or bedside chair)?: A Little Help needed to walk in hospital room?: A Little Help needed climbing 3-5 steps with a railing? : A Little 6 Click Score: 18    End of Session Equipment Utilized During Treatment: Gait belt Activity Tolerance: Patient tolerated treatment well Patient left: with call bell/phone within reach;in bed Nurse Communication: Mobility status PT Visit Diagnosis: Pain;Difficulty in walking, not elsewhere classified (R26.2) Pain - Right/Left: Right Pain - part of body: Hip     Time: 0935-1000 PT Time Calculation (min) (ACUTE ONLY): 25 min  Charges:  $Gait Training: 8-22 mins $Therapeutic Exercise: 8-22 mins                     Wray Kearns, PT, DPT Acute Rehabilitation Services Pager [phone] Office [phone]       Marguarite Arbour A Sabra Heck 01/15/2019, 11:38 AM

## 2019-01-18 LAB — CULTURE, BLOOD (ROUTINE X 2)
Culture: NO GROWTH
Culture: NO GROWTH
Special Requests: ADEQUATE

## 2019-01-20 MED ORDER — CIMETIDINE 400 MG TABLET
ORAL_TABLET | Freq: Four times a day (QID) | ORAL | 5 refills | 0 days | Status: CP
Start: 2019-01-20 — End: 2019-03-05
  Filled 2019-01-29: qty 120, 30d supply, fill #0

## 2019-01-21 ENCOUNTER — Other Ambulatory Visit (HOSPITAL_COMMUNITY): Payer: Self-pay | Admitting: Orthopedic Surgery

## 2019-01-21 DIAGNOSIS — M79604 Pain in right leg: Secondary | ICD-10-CM

## 2019-01-22 ENCOUNTER — Ambulatory Visit (HOSPITAL_COMMUNITY)
Admission: RE | Admit: 2019-01-22 | Discharge: 2019-01-22 | Disposition: A | Discharge status: Home / Self Care | Payer: Medicaid Other | Source: Ambulatory Visit | Attending: Orthopedic Surgery | Admitting: Orthopedic Surgery

## 2019-01-22 ENCOUNTER — Other Ambulatory Visit: Payer: Self-pay

## 2019-01-22 DIAGNOSIS — M79604 Pain in right leg: Secondary | ICD-10-CM | POA: Insufficient documentation

## 2019-01-22 DIAGNOSIS — M7989 Other specified soft tissue disorders: Secondary | ICD-10-CM

## 2019-01-22 NOTE — Progress Notes (Signed)
RLE venous duplex       has been completed. Preliminary results can be found under CV proc through chart review. Amiayah Giebel, BS, RDMS, RVT   

## 2019-01-26 ENCOUNTER — Emergency Department (HOSPITAL_COMMUNITY): Payer: Medicaid Other

## 2019-01-26 ENCOUNTER — Encounter (HOSPITAL_COMMUNITY): Payer: Self-pay | Admitting: Emergency Medicine

## 2019-01-26 ENCOUNTER — Emergency Department (HOSPITAL_COMMUNITY)
Admission: EM | Admit: 2019-01-26 | Discharge: 2019-01-26 | Disposition: A | Discharge status: Home / Self Care | Payer: Medicaid Other | Attending: Emergency Medicine | Admitting: Emergency Medicine

## 2019-01-26 ENCOUNTER — Other Ambulatory Visit: Payer: Self-pay

## 2019-01-26 DIAGNOSIS — Z20828 Contact with and (suspected) exposure to other viral communicable diseases: Secondary | ICD-10-CM | POA: Diagnosis not present

## 2019-01-26 DIAGNOSIS — I10 Essential (primary) hypertension: Secondary | ICD-10-CM | POA: Insufficient documentation

## 2019-01-26 DIAGNOSIS — Z79899 Other long term (current) drug therapy: Secondary | ICD-10-CM | POA: Diagnosis not present

## 2019-01-26 DIAGNOSIS — J181 Lobar pneumonia, unspecified organism: Secondary | ICD-10-CM | POA: Diagnosis not present

## 2019-01-26 DIAGNOSIS — Z7901 Long term (current) use of anticoagulants: Secondary | ICD-10-CM | POA: Insufficient documentation

## 2019-01-26 DIAGNOSIS — R0789 Other chest pain: Secondary | ICD-10-CM

## 2019-01-26 DIAGNOSIS — J189 Pneumonia, unspecified organism: Secondary | ICD-10-CM

## 2019-01-26 DIAGNOSIS — F17210 Nicotine dependence, cigarettes, uncomplicated: Secondary | ICD-10-CM | POA: Insufficient documentation

## 2019-01-26 IMAGING — CR CHEST - 2 VIEW
2 series · 2 of 2 positions shown · non-contrast
Comparison: [DATE]

CLINICAL DATA: Possible pneumothorax with chest pain and
shortness-of-breath as well as cough.

EXAM:
CHEST - 2 VIEW

[chest lat]
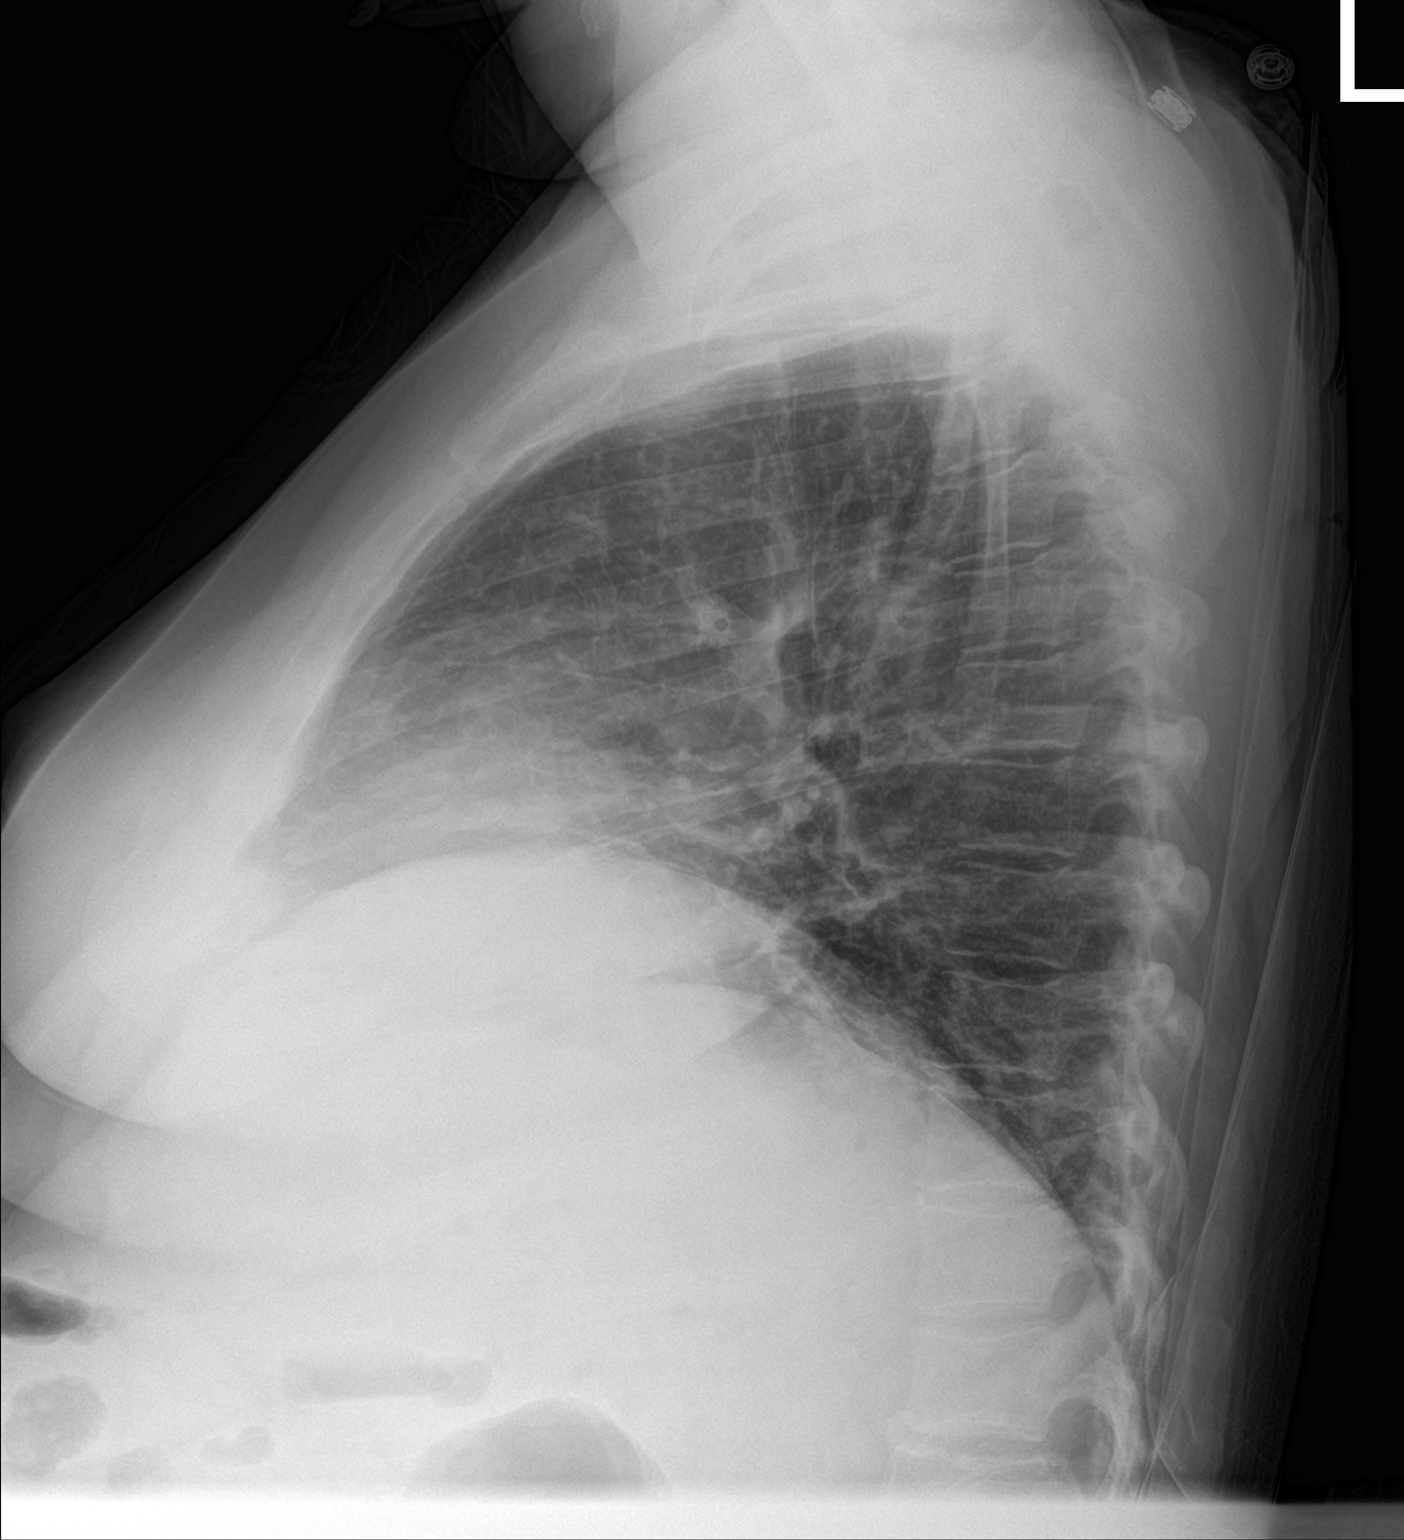

[chest ap]
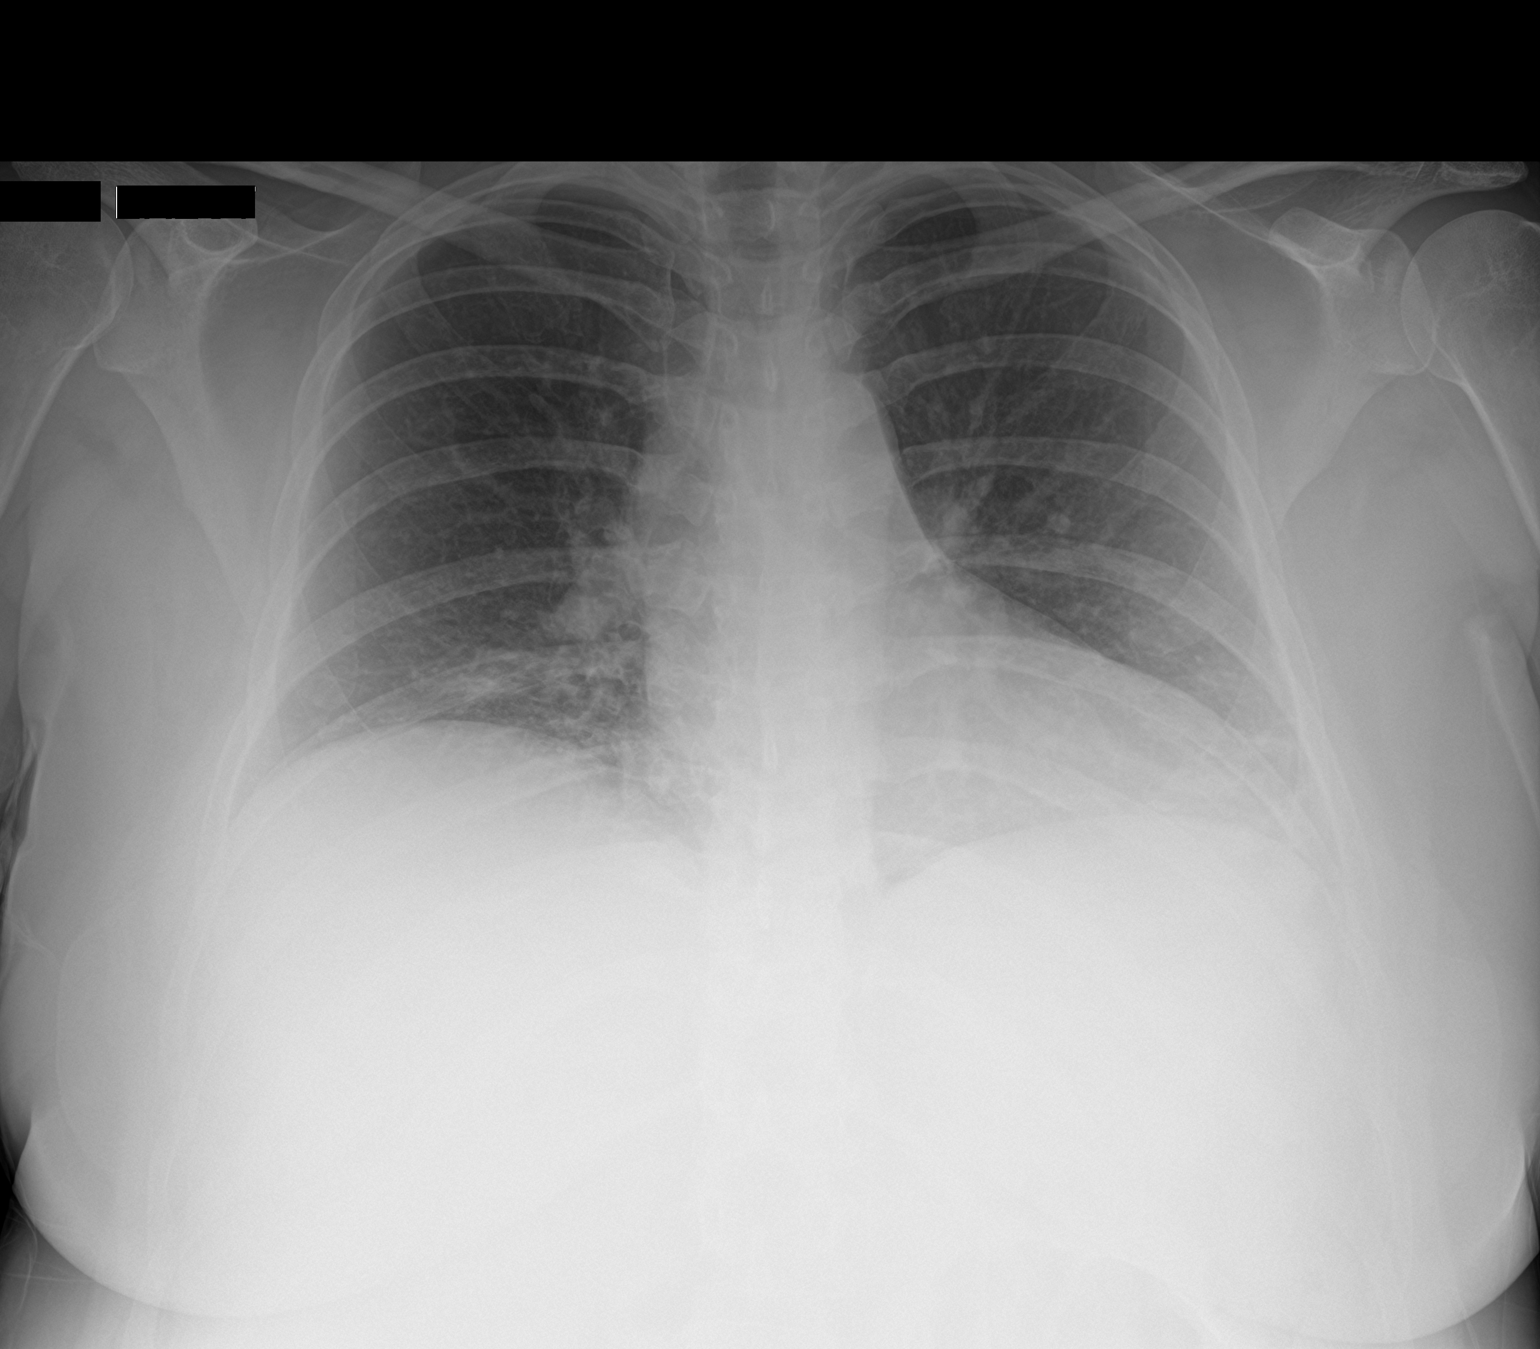

[2 of 2 positions shown; findings below may reference images not displayed]

FINDINGS: Lungs are hypoinflated with hazy airspace density over the region of
the right middle lobe/lingula on the lateral film. No effusion or
pneumothorax. Cardiomediastinal silhouette and remainder the exam is
unchanged.
IMPRESSION: Mild hazy density over the lingula/right middle lobe on the lateral
film which may be due to atelectasis or early infection.

## 2019-01-26 IMAGING — DX DG HIP (WITH OR WITHOUT PELVIS) 2-3V RIGHT
3 series · 3 of 3 positions shown · non-contrast
Comparison: [DATE]

CLINICAL DATA: Postoperative exam.

EXAM:
DG HIP (WITH OR WITHOUT PELVIS) 2-3V RIGHT

[t pelvis ap]
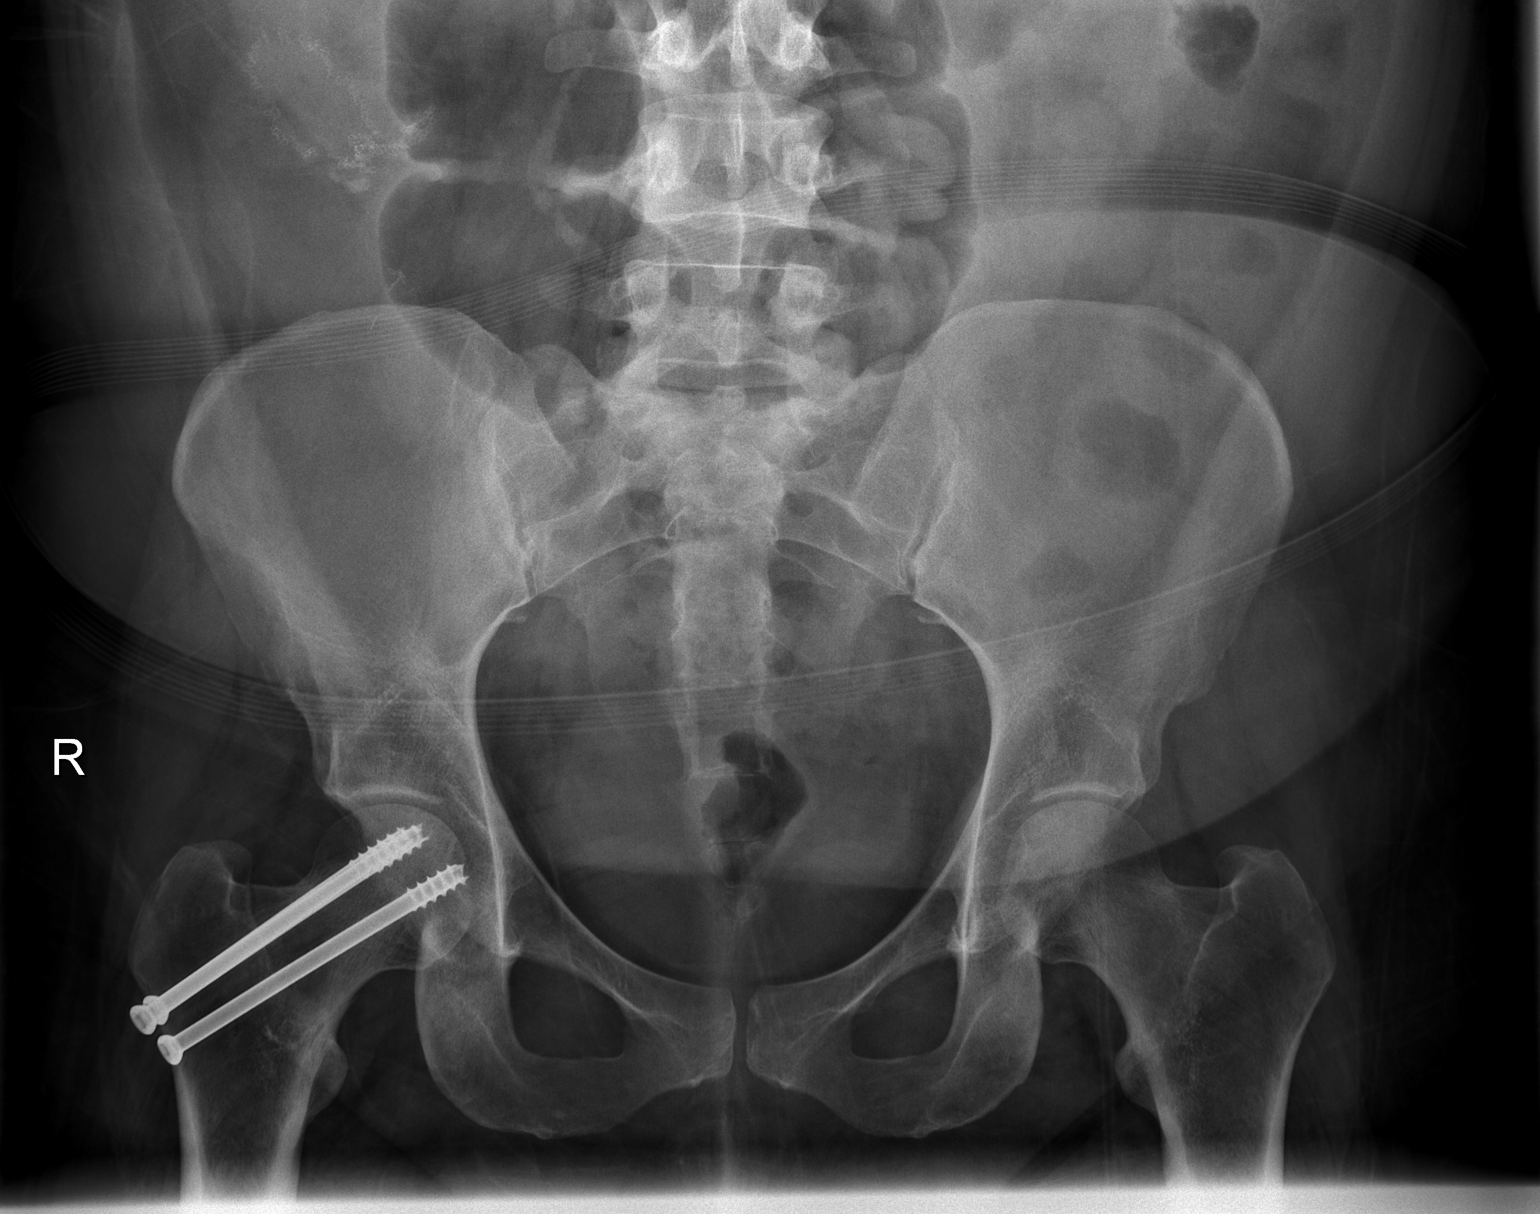

[t hip ap right]
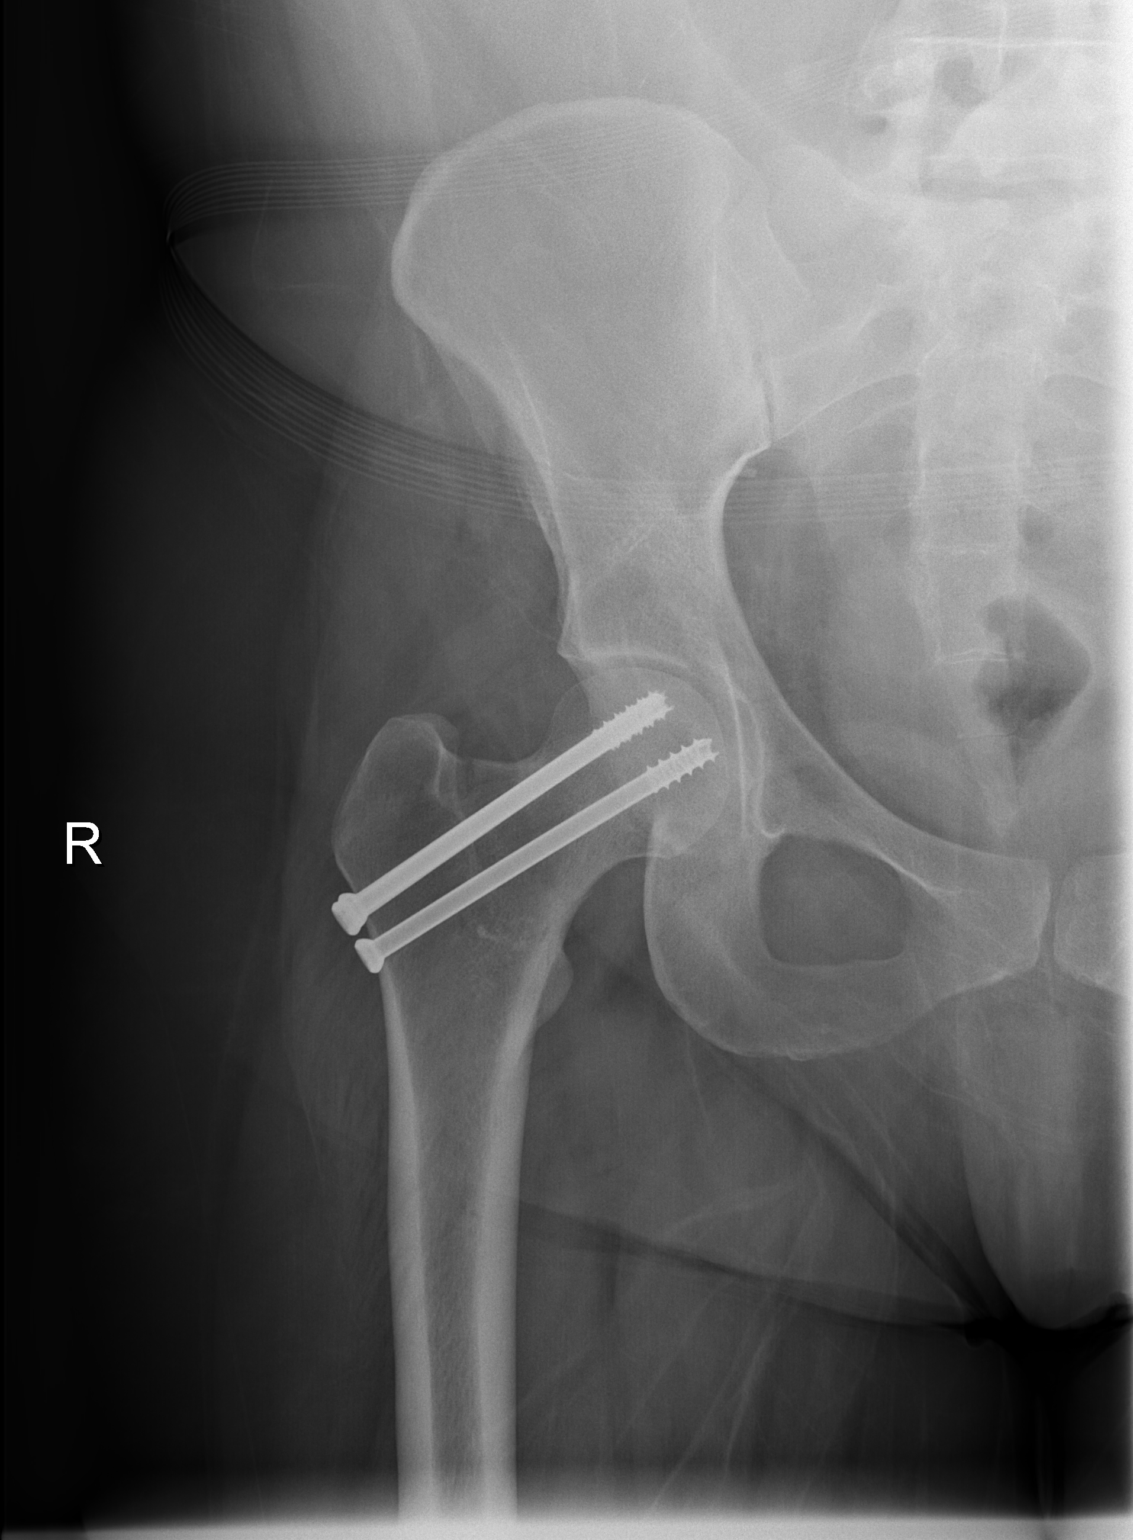

[t hip frog leg right]
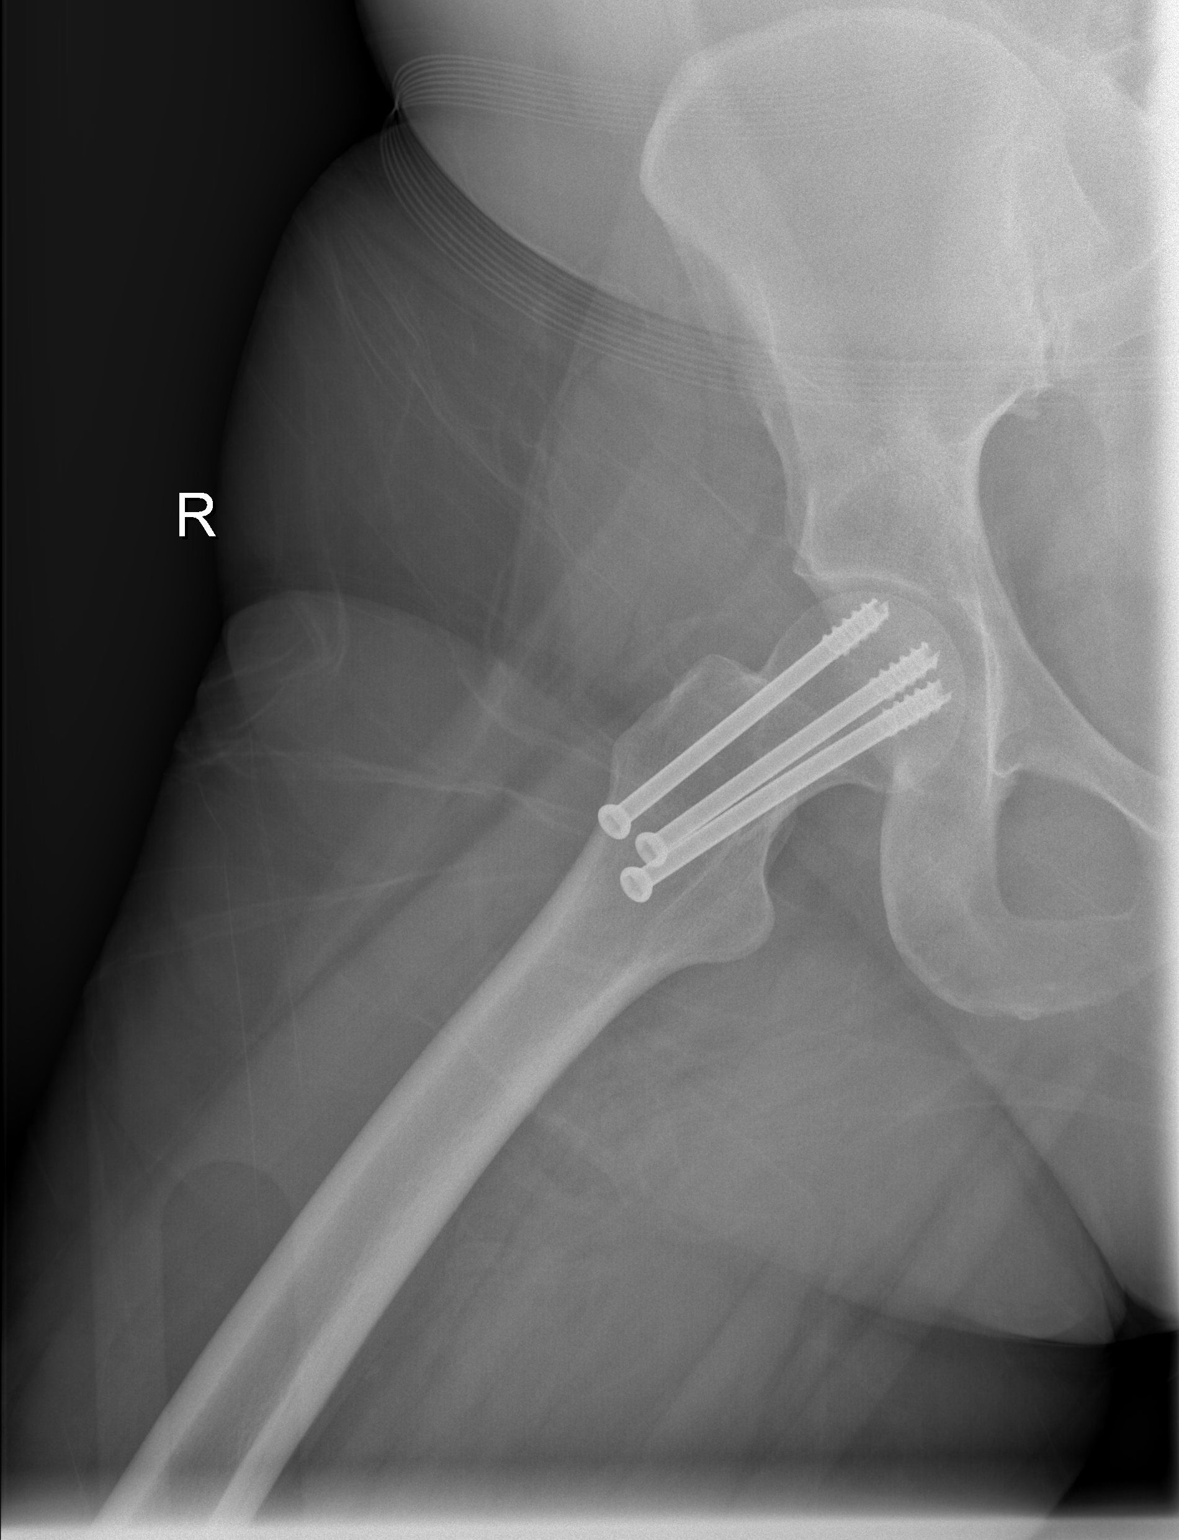

[3 of 3 positions shown; findings below may reference images not displayed]

FINDINGS: Two screw fixation of the right femoral head/neck demonstrates
normal alignment. No evidence of immediate complications.

Postsurgical changes in the right abdomen.
IMPRESSION: Status post right femoral head/neck fixation without evidence of
immediate complications.

## 2019-01-26 MED ORDER — OXYCODONE HCL 5 MG PO TABS: 5.0000 mg | ORAL_TABLET | Freq: Four times a day (QID) | ORAL | 0 refills | Status: DC | PRN

## 2019-01-26 MED ORDER — KETOROLAC TROMETHAMINE 30 MG/ML IJ SOLN
30.0000 mg | Freq: Once | INTRAMUSCULAR | Status: AC
  Administered 2019-01-26: 30 mg via INTRAMUSCULAR
  Filled 2019-01-26: qty 1

## 2019-01-26 MED ORDER — AZITHROMYCIN 250 MG PO TABS: ORAL_TABLET | ORAL | 0 refills | Status: DC

## 2019-01-26 MED ORDER — OXYCODONE-ACETAMINOPHEN 5-325 MG PO TABS
2.0000 | ORAL_TABLET | Freq: Once | ORAL | Status: AC
  Administered 2019-01-26: 2 via ORAL
  Filled 2019-01-26: qty 2

## 2019-01-26 NOTE — ED Notes (Signed)
Patient verbalizes understanding of discharge instructions. Opportunity for questioning and answers were provided. Armband removed by staff, pt discharged from ED.  

## 2019-01-26 NOTE — ED Notes (Signed)
Patient transported to X-ray 
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
## 2019-01-26 NOTE — ED Notes (Signed)
Pt able to ambulate with the assistance of her walker without complaint. No SOB or increased work of breathing noted.

## 2019-01-26 NOTE — ED Triage Notes (Signed)
Pt in with c/o R thoracic back pain after coughing last night and hearing "pop". States she then experienced RLE and R hand numbness/tingling, better today. Recent R hip surgery 6/7. Takes Oxycodone for pain, is currently out since this am

## 2019-01-26 NOTE — Discharge Instructions (Signed)
Your pain is likely musculoskeletal related.  Take pain medication as prescribed.  Your chest xray show possible early sign of pneumonia, take antibiotic prescribed.  A covid-19 test was obtained, it will take 48 hrs for it to result, you will be notify if test positive.  Follow up with your doctor for further care.

## 2019-01-28 NOTE — Unmapped (Signed)
[facility] Refill Coordination Note    Specialty Medication(s) to be Shipped:   General Specialty: stelara 90mg /ml     Other medication(s) to be shipped:      Mary Hawkins, DOB: Feb 22, 1981  Phone: [phone] (home) [phone] (work)      All above HIPAA information was verified with patient.     Completed refill call assessment today to schedule patient's medication shipment from the [facility] [phone]).       Specialty medication(s) and dose(s) confirmed: Regimen is correct and unchanged.   Changes to medications: Mary Hawkins reports no changes at this time.  Changes to insurance: No  Questions for the pharmacist: No    Confirmed patient received Welcome Packet with first shipment. The patient will receive a drug information handout for each medication shipped and additional FDA Medication Guides as required.       DISEASE/MEDICATION-SPECIFIC INFORMATION        N/A    SPECIALTY MEDICATION ADHERENCE     Medication Adherence    Patient reported X missed doses in the last month:  0  Specialty Medication:  stelara 90mg /ml  Patient is on additional specialty medications:  No                stelara  90 mg/ml: 0 days of medicine on hand       SHIPPING     Shipping address confirmed in Epic.     Delivery Scheduled: Yes, Expected medication delivery date: 07/02.     Medication will be delivered via UPS to the home address in Epic WAM.    Mary Hawkins   [facility] Specialty Technician

## 2019-01-29 LAB — NOVEL CORONAVIRUS, NAA (HOSP ORDER, SEND-OUT TO REF LAB; TAT 18-24 HRS): SARS-CoV-2, NAA: NOT DETECTED

## 2019-01-29 MED FILL — CYANOCOBALAMIN (VIT B-12) 1,000 MCG/ML INJECTION SOLUTION: SUBCUTANEOUS | 90 days supply | Qty: 3 | Fill #0

## 2019-01-29 MED FILL — CYANOCOBALAMIN (VIT B-12) 1,000 MCG/ML INJECTION SOLUTION: 90 days supply | Qty: 3 | Fill #0 | Status: AC

## 2019-01-29 MED FILL — CIMETIDINE 400 MG TABLET: 30 days supply | Qty: 120 | Fill #0 | Status: AC

## 2019-02-04 ENCOUNTER — Telehealth: Payer: Self-pay | Admitting: Neurology

## 2019-02-04 NOTE — Telephone Encounter (Signed)
Due to current COVID 19 pandemic, our office is severely reducing in office visits until further notice, in order to minimize the risk to our patients and healthcare providers.   I called patient regarding her 7/2 appt. Pt has a possible COVID flag in her chart. I inquired about this and patient states that she had a test done recently and her results were negative. Patient agrees to coming in the office for this visit.

## 2019-02-05 MED FILL — DULOXETINE 60 MG CAPSULE,DELAYED RELEASE: 30 days supply | Qty: 30 | Fill #2 | Status: AC

## 2019-02-05 MED FILL — ONDANSETRON 8 MG DISINTEGRATING TABLET: 20 days supply | Qty: 60 | Fill #1 | Status: AC

## 2019-02-05 MED FILL — DULOXETINE 60 MG CAPSULE,DELAYED RELEASE: ORAL | 30 days supply | Qty: 30 | Fill #2

## 2019-02-05 MED FILL — HYDROXYZINE HCL 50 MG TABLET: ORAL | 30 days supply | Qty: 90 | Fill #3

## 2019-02-05 MED FILL — ONDANSETRON 8 MG DISINTEGRATING TABLET: ORAL | 20 days supply | Qty: 60 | Fill #1

## 2019-02-05 MED FILL — MIRTAZAPINE 30 MG TABLET: 30 days supply | Qty: 30 | Fill #3 | Status: AC

## 2019-02-05 MED FILL — MIRTAZAPINE 30 MG TABLET: ORAL | 30 days supply | Qty: 30 | Fill #3

## 2019-02-05 MED FILL — HYDROXYZINE HCL 50 MG TABLET: 30 days supply | Qty: 90 | Fill #3 | Status: AC

## 2019-02-05 MED FILL — DIPHENOXYLATE-ATROPINE 2.5 MG-0.025 MG TABLET: 20 days supply | Qty: 60 | Fill #0 | Status: AC

## 2019-02-06 MED FILL — STELARA 90 MG/ML SUBCUTANEOUS SYRINGE: SUBCUTANEOUS | 34 days supply | Qty: 1 | Fill #2

## 2019-02-06 MED FILL — STELARA 90 MG/ML SUBCUTANEOUS SYRINGE: 34 days supply | Qty: 1 | Fill #2 | Status: AC

## 2019-02-07 ENCOUNTER — Encounter: Payer: Self-pay | Admitting: Neurology

## 2019-02-07 ENCOUNTER — Telehealth: Payer: Self-pay | Admitting: Neurology

## 2019-02-07 ENCOUNTER — Ambulatory Visit (INDEPENDENT_AMBULATORY_CARE_PROVIDER_SITE_OTHER): Payer: Medicaid Other | Admitting: Neurology

## 2019-02-07 ENCOUNTER — Other Ambulatory Visit: Payer: Self-pay

## 2019-02-07 VITALS — BP 154/103 | HR 110 | Temp 97.5°F | Ht 62.0 in | Wt 178.0 lb

## 2019-02-07 DIAGNOSIS — G40909 Epilepsy, unspecified, not intractable, without status epilepticus: Secondary | ICD-10-CM | POA: Diagnosis not present

## 2019-02-07 DIAGNOSIS — M797 Fibromyalgia: Secondary | ICD-10-CM | POA: Diagnosis not present

## 2019-02-07 MED ORDER — PREGABALIN 50 MG PO CAPS: 50.0000 mg | ORAL_CAPSULE | Freq: Two times a day (BID) | ORAL | 3 refills | Status: DC

## 2019-02-07 MED ORDER — ALPRAZOLAM 0.5 MG PO TABS: ORAL_TABLET | ORAL | 0 refills | Status: DC
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
## 2019-02-07 NOTE — Telephone Encounter (Signed)
medicaid order sent to [facility]. They will obtain the auth and reach out to the patient to schedule.

## 2019-02-14 ENCOUNTER — Telehealth: Payer: Self-pay | Admitting: Neurology

## 2019-02-14 ENCOUNTER — Other Ambulatory Visit: Payer: Self-pay

## 2019-02-14 ENCOUNTER — Ambulatory Visit: Payer: Medicaid Other | Admitting: Neurology

## 2019-02-14 DIAGNOSIS — G40909 Epilepsy, unspecified, not intractable, without status epilepticus: Secondary | ICD-10-CM | POA: Diagnosis not present

## 2019-02-14 NOTE — Telephone Encounter (Signed)
I called the patient.  The EEG study was unremarkable.  MRI of the brain is pending, I will contact her when I get the results.

## 2019-02-14 NOTE — Procedures (Signed)
    History:  [name] is a 38 year old patient with a history of witnessed seizure event that occurred on 19 October 2018.  The patient suddenly stiffened and fell backwards, she struck her head and bit her tongue.  The head trauma resulted in a small subarachnoid hemorrhage.  The patient is being evaluated for a possible seizure disorder.  This is a routine EEG.  No skull defects are noted.  Medications include vitamin B12, Lomotil, Cymbalta, iron supplementation, hydroxyzine, Remeron, Zofran, oxycodone, potassium supplementation, Senokot, Xanax, Protonix, Lyrica, and Norvasc.  EEG classification: Normal awake and drowsy  Description of the recording: The background rhythms of this recording consists of a fairly well modulated medium amplitude alpha rhythm of 10 Hz that is reactive to eye opening and closure. As the record progresses, the patient appears to remain in the waking state throughout the recording. Photic stimulation was performed, resulting in a bilateral and symmetric photic driving response. Hyperventilation was also performed, resulting in a minimal buildup of the background rhythm activities without significant slowing seen. Toward the end of the recording, the patient enters the drowsy state with slight symmetric slowing seen. The patient never enters stage II sleep. At no time during the recording does there appear to be evidence of spike or spike wave discharges or evidence of focal slowing. EKG monitor shows no evidence of cardiac rhythm abnormalities with a heart rate of 84.  Impression: This is a normal EEG recording in the waking and drowsy state. No evidence of ictal or interictal discharges are seen.

## 2019-02-18 ENCOUNTER — Encounter: Payer: Self-pay | Admitting: Neurology

## 2019-02-18 ENCOUNTER — Telehealth: Payer: Self-pay | Admitting: Neurology

## 2019-02-18 MED ORDER — ONDANSETRON 8 MG DISINTEGRATING TABLET
ORAL_TABLET | Freq: Three times a day (TID) | ORAL | 1 refills | 20.00000 days | Status: CP | PRN
Start: 2019-02-18 — End: 2019-04-25
  Filled 2019-04-05: qty 60, 20d supply, fill #0

## 2019-02-18 MED ORDER — GABAPENTIN 100 MG PO CAPS: 100.0000 mg | ORAL_CAPSULE | Freq: Three times a day (TID) | ORAL | 3 refills | Status: DC

## 2019-02-18 NOTE — Addendum Note (Signed)
Addended by: Kathrynn Ducking on: 02/18/2019 04:39 PM   Modules accepted: Orders

## 2019-02-18 NOTE — Telephone Encounter (Signed)
Pt returned call. Please call when available.

## 2019-02-18 NOTE — Telephone Encounter (Signed)
Patient stopped pregabalin (LYRICA) 50 MG capsule because it was giving her  the shakes really bad . Patient stated she stopped RX on her on.  Patient is wanting a new RX.

## 2019-02-18 NOTE — Telephone Encounter (Signed)
I called the patient.  The patient cannot tolerate Lyrica, she was having twitches and jerks on the medication.  She has been able to tolerate gabapentin previously, I will start on a low-dose of 100 mg capsules 3 times daily.  She will call for any dose adjustments.

## 2019-02-18 NOTE — Telephone Encounter (Signed)
I tried to call the patient, left message, I will call back later.

## 2019-02-20 MED ORDER — GABAPENTIN 100 MG PO CAPS: 200.0000 mg | ORAL_CAPSULE | Freq: Three times a day (TID) | ORAL | 3 refills | Status: DC

## 2019-02-20 NOTE — Addendum Note (Signed)
Addended by: Kathrynn Ducking on: 02/20/2019 04:38 PM   Modules accepted: Orders

## 2019-02-20 NOTE — Telephone Encounter (Signed)
I called the patient.  The gabapentin can be increased to 200 mg 3 times daily, after several days if she is tolerating this, we can pushed up higher to 300 mg 3 times daily.  The patient claims that she is not on oxycodone currently.

## 2019-02-20 NOTE — Telephone Encounter (Signed)
Pt called in and stated she is still in pain and wants to know if her dose of gabapentin can be increased

## 2019-02-22 NOTE — Unmapped (Addendum)
Pt sent message needing CT prior to app with Dr [name] on Thursday - this was orderd in April. Scheduled for pt on Thursday but only available at 1:40 at [facility].     Spoke to pt, she refuses to come to [place] at this time - states she will not be going to CT app tomorrow and will continue with phone visit with Dr [name] as scheduled. Does not have virtual capability - must be phone visit.     Updated Dr. [name] - Called CT and app is cancelled.

## 2019-02-25 ENCOUNTER — Telehealth: Payer: Self-pay | Admitting: *Deleted

## 2019-02-25 MED ORDER — GABAPENTIN 800 MG PO TABS: 800.0000 mg | ORAL_TABLET | Freq: Three times a day (TID) | ORAL | 3 refills | Status: DC

## 2019-02-25 NOTE — Telephone Encounter (Signed)
Patient need refill on  Neurontin call in. Please call [phone]

## 2019-02-25 NOTE — Addendum Note (Signed)
Addended by: Kathrynn Ducking on: 02/25/2019 08:38 PM   Modules accepted: Orders

## 2019-02-25 NOTE — Telephone Encounter (Signed)
I called the patient.  The patient had already worked up to 600 mg 3 times a day with a 100 mg capsules, we can go higher to 800 mg 3 times a day, this may help the restless leg syndrome, if it does not we may add another medication such as Requip.

## 2019-02-25 NOTE — Telephone Encounter (Signed)
I reached out to the pt. She states she has been taking 300 mg tid of the gabapentin with some relief, but wanted to know if Dr. Jannifer Franklin would be ok with increasing further?  Pt also states she is struggling with RLS at night and is having trouble sleeping. Pt wanted to know if Dr. Jannifer Franklin could recommend anything for this issue as well?

## 2019-02-27 MED ORDER — PRAMIPEXOLE DIHYDROCHLORIDE 0.25 MG PO TABS: 0.2500 mg | ORAL_TABLET | Freq: Every day | ORAL | 1 refills | Status: DC

## 2019-02-27 NOTE — Unmapped (Signed)
Called pt to discuss appointment scheduling plan -   Tried both cell and home numbers, pt did not answer.   Sent message to discuss moving pt app with Dr [name] to after July 28th which will be after her CT tomorrow at 1:40.

## 2019-02-27 NOTE — Telephone Encounter (Signed)
Pt called in and stated the restless leg is getting worse and nothing is helping

## 2019-02-27 NOTE — Telephone Encounter (Signed)
I called the patient.  The gabapentin is not helping her restless legs, we will add Mirapex 0.25 mg to take at night.

## 2019-02-27 NOTE — Addendum Note (Signed)
Addended by: Kathrynn Ducking on: 02/27/2019 04:36 PM   Modules accepted: Orders

## 2019-02-27 NOTE — Telephone Encounter (Signed)
I called pt about her restless leg is getting worse. Pt is taking the increase dosage of gabapentin 830m three times which started on 02/25/2019. Pt stated its still not helping. I stated message will be sent to Dr.WIllis. Pt verbalized understanding.

## 2019-02-28 ENCOUNTER — Ambulatory Visit: Admit: 2019-02-28 | Discharge: 2019-03-01 | Payer: MEDICAID

## 2019-02-28 ENCOUNTER — Telehealth: Admit: 2019-02-28 | Discharge: 2019-03-01 | Payer: MEDICAID | Attending: Internal Medicine | Primary: Internal Medicine

## 2019-02-28 DIAGNOSIS — K209 Esophagitis, unspecified: Secondary | ICD-10-CM

## 2019-02-28 DIAGNOSIS — F411 Generalized anxiety disorder: Secondary | ICD-10-CM

## 2019-02-28 DIAGNOSIS — K50919 Crohn's disease, unspecified, with unspecified complications: Principal | ICD-10-CM

## 2019-02-28 DIAGNOSIS — E538 Deficiency of other specified B group vitamins: Secondary | ICD-10-CM

## 2019-02-28 DIAGNOSIS — R635 Abnormal weight gain: Secondary | ICD-10-CM

## 2019-02-28 DIAGNOSIS — Z720 Tobacco use: Secondary | ICD-10-CM

## 2019-02-28 MED ORDER — PANTOPRAZOLE 40 MG TABLET,DELAYED RELEASE
ORAL_TABLET | Freq: Every day | ORAL | 2 refills | 30 days | Status: CP
Start: 2019-02-28 — End: 2019-03-04

## 2019-02-28 NOTE — Unmapped (Signed)
[facility]  TELEPHONE NOTE - INFLAMMATORY BOWEL DISEASE  02/28/2019    Demographics:  Mary Hawkins is a 38 y.o. female with Crohn's disease    Referring physician:   [facility]          HPI / NOTE :     Today, I spoke to Mary Hawkins for follow up consultation in the [facility] Inflammatory Bowel Disease Center at the request of [facility] regarding Crohn's Disease.  Outside records including available clinical notes, endoscopy reports, imaging results and pathology results were reviewed in detail as part of this follow up consultation.     Phone visit due to COVID-19 pandemic. Patient consented to phone visit.  Time spent on phone 28 minutes   Called [phone]    HPI:  I spoke to Mary Hawkins on the phone today for follow up. I last spoke to her at the end of March. She was then seen in urgent IBD clinic in April where it was recommended that she undergo CT Enterography to evaluate her symptoms. Unfortunately she has not gotten this done despite multiple reminders to do so. She also was admitted to OSH last month for a hip fracture and required placement of 2 screws. She is now still using a walker but pain is much better - she couldn't walk and was in so much pain before she went into the hospital. She was told that they thought her hip fracture was related to Crohn's disease.     She is still taking Stelara every 8 weeks. Having 4-10 BMs per day without blood. She is not taking Questran which is prescribed for her. Her abdomen is still bloated and weight has been stable although she says she is heavier than she has ever been - 175 lbs.     Her heartburn has worsened lately as she was previously on pepcid but out of stock. Then had to use tagamet which isn't working. She was on PPI in the past but we stopped this due to recurrent issues with hypomagnesemia. She wants to restart PPI and to have an endoscopy as her reflux has worsened a lot.     She hasn't been Hawkins to schedule an appointment with Psychiatry due to need for video visit. She thinks that her phone should work but she doesn't know how to figure it out. It is a Chiropodist. She wants me to increase her Remeron and Cymbalta but we had agreed to refer to Psychiatry. She also doesn't think Remeron is helping with any symptoms including sleep.     She is still smoking 1/2 ppd cigarettes.     Taking vitamin B12 injections monthly. No longer taking vitamin D.    Stressful having son at home with homeschooling as well due to COVID-19.    Review of Systems: positive as per HPI   Otherwise, the balance of 10 systems is negative.          IBD HISTORY:     Year of disease onset:  2010  Diagnosis:  Crohn's Disease  Age at onset:   44-40 yr old (A2)  Location:  [place] (L3)  Behavior:  Stricturing (B2)  Perianal Dz:  No    Brief IBD Disease Course:    2010 diagnosed with stricturing ileal Crohn's disease, treated 8 months with prednisone. 02/2010 start of Humira. 03/2011 stop Humira due to recurrent skin infections, started Entocort, issues with rectal prolapse and found to have ileal stricture. 11/13/13 ileocecal resection with Dr.  Koruda (28 cm of ileum resected, 5 cm colon resected - path c/w Crohn's disease but no granulomas), also with incidental Meckel's diverticulum noted and resected, mesh rectopexy for rectal prolapse. 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 03/18/14 discussed Remicade/Imuran but did not start these medications. 5/1-5/15/19 admission to [facility] for ARDS 2/2 rhinovirus requiring mechanical ventilation, AKI. 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 02/16/18 IV loading dose of ustekinumab followed by ustekinumab 90 mg [place] every 8 weeks; self-discontinued ustekinumab. 08/2018 repeat IV load ustekinumab. 01/2019 hip fracture.     Endoscopy:      Colon 12/27/17: Mild inflammation and a few ulcerations at ileocolonic anastomosis. Rutgeert's i2. Localized mildly granular mucosa in the sigmoid colon.  - path: Small bowel - patchy crypt architectural distortion with Paneth cell hyperplasia, consistent with mild chronic active enteritis, negative for dysplasia, no granulomas. Colon - unremarkable colonic mucosa.   EGD 12/27/17: Grade D esophagitis, small hiatal hernia, otherwise normal stomach and duodenum.   - path: ulceration with inflamed granulation tissue, no granulomas  Colon 02/20/14: Multiple discrete ulcerations seen corresponding to Rutgeert's i2.   EGD 11/12/13: Normal esophagus. Markedly thickened gastric folds without inflammation seen. Normal duodenum.     Imaging:    CT Head 10/23/18: Resolution of the previously seen right frontal subarachnoid  hemorrhage. No acute intracranial abnormality.    CT Head/CT Cervical Spine 10/20/18: Suggestion of tiny focus of acute subarachnoid hemorrhage in the right posterior frontal convexity. This may represent a tiny surface contusion. Normal alignment of the cervical spine. No acute displaced fractures identified.    CT Abd/Pelvis 08/25/18: Multiple fluid-filled loops of small bowel and fluid throughout the colon as can be seen with enterocolitis. Otherwise no acute abdominal or pelvic pathology. Left ovarian cyst.    CT A/P 12/24/17: Mild wall thickening of the neoterminal ileum, possibly mild ileus. Right lower quadrant adenopathy, probably reactive.    Echo 12/09/17: - Left ventricle: The cavity size was normal. Wall thickness was   ??increased in a pattern of mild LVH. Systolic function was   ??vigorous. The estimated ejection fraction was in the range of 65%   ??to 70%. Doppler parameters are consistent with abnormal left   ??ventricular relaxation (grade 1 diastolic dysfunction).  - Pulmonary arteries: PA peak pressure: 42 mm Hg (S).    Prior IBD medications (type, dose, duration, response):  - prednisone  - Entocort  - adalimumab 2011-2012, good response but developed recurrent skin infections prompting discontinuation  - ustekinumab 02/2018 - 04/2018 before self discontinued, reload 08/2018 and now 90 mg [place] every 8 weeks             Past Medical History:   Past medical history:   Past Medical History:   Diagnosis Date   ??? Anxiety    ??? Arthritis    ??? Back pain    ??? Crohn disease (CMS-HCC)    ??? Eczema    ??? Endometriosis    ??? GERD (gastroesophageal reflux disease)    ??? Headache(784.0)    ??? Hypertension    ??? Ulcerative colitis (CMS-HCC)      Past surgical history:   Past Surgical History:   Procedure Laterality Date   ??? BREAST RECONSTRUCTION  2004   ??? ENDOMETRIAL ABLATION     ??? PR COLONOSCOPY FLX DX W/COLLJ SPEC WHEN PFRMD N/A 11/12/2013    Procedure: COLONOSCOPY, FLEXIBLE, PROXIMAL TO SPLENIC FLEXURE; DIAGNOSTIC, W/WO COLLECTION SPECIMEN BY BRUSH OR WASH;  Surgeon: Teodoro Spray,  MD;  Location: [facility];  Service: Gastroenterology   ??? PR COLONOSCOPY FLX DX W/COLLJ SPEC WHEN PFRMD N/A 02/20/2014    Procedure: COLONOSCOPY, FLEXIBLE, PROXIMAL TO SPLENIC FLEXURE; DIAGNOSTIC, W/WO COLLECTION SPECIMEN BY BRUSH OR WASH;  Surgeon: Billie Ruddy, MD;  Location: [facility]URES [facility];  Service: Gastroenterology   ??? PR COLONOSCOPY FLX DX W/COLLJ SPEC WHEN PFRMD N/A 12/27/2017    Procedure: COLONOSCOPY, FLEXIBLE, PROXIMAL TO SPLENIC FLEXURE; DIAGNOSTIC, W/WO COLLECTION SPECIMEN BY BRUSH OR WASH;  Surgeon: Bronson Curb, MD;  Location: GI PROCEDURES [facility];  Service: Gastroenterology   ??? PR REMVL COLON & TERM ILEUM W/ILEOCOLOSTOMY N/A 11/13/2013    Procedure: Ileocecectomy, meckel's diverticulectomy, mesh rectopexy;  Surgeon: Emilia Beck, MD;  Location: MAIN OR [place];  Service: Gastrointestinal   ??? PR UPPER GI ENDOSCOPY,BIOPSY N/A 11/12/2013    Procedure: UGI ENDOSCOPY; WITH BIOPSY, SINGLE OR MULTIPLE;  Surgeon: Teodoro Spray, MD;  Location: GI [facility];  Service: Gastroenterology   ??? PR UPPER GI ENDOSCOPY,BIOPSY N/A 12/27/2017    Procedure: UGI ENDOSCOPY; WITH BIOPSY, SINGLE OR MULTIPLE;  Surgeon: Bronson Curb, MD; Location: GI [facility];  Service: Gastroenterology   ??? RECTAL PROLAPSE REPAIR       Family history:   Family History   Problem Relation Age of Onset   ??? Esophageal cancer Maternal Grandfather    ??? Cancer Maternal Grandfather         Gastric cancer     Social history: Single - has son. She is currently smoking 1/2 ppd (down from 1 ppd) - she had quit in the hospital but started again. Occasional alcohol use. No illicit drug use.   Social History     Socioeconomic History   ??? Marital status: Single     Spouse name: None   ??? Number of children: None   ??? Years of education: None   ??? Highest education level: None   Occupational History   ??? None   Social Needs   ??? Financial resource strain: None   ??? Food insecurity     Worry: None     Inability: None   ??? Transportation needs     Medical: None     Non-medical: None   Tobacco Use   ??? Smoking status: Current Every Day Smoker     Packs/day: 0.50     Years: 15.00     Pack years: 7.50     Types: Cigarettes     Last attempt to quit: 12/06/2017     Years since quitting: 1.2   ??? Smokeless tobacco: Never Used   ??? Tobacco comment: stopped in 2019 restarted back   Substance and Sexual Activity   ??? Alcohol use: Yes     Frequency: 2-4 times a month     Drinks per session: 1 or 2     Comment: socially   ??? Drug use: No   ??? Sexual activity: None   Lifestyle   ??? Physical activity     Days per week: None     Minutes per session: None   ??? Stress: None   Relationships   ??? Social Wellsite geologist on phone: None     Gets together: None     Attends religious service: None     Active member of club or organization: None     Attends meetings of clubs or organizations: None     Relationship status: None  Other Topics Concern   ??? None   Social History Narrative    Currently unemployed.  She is a 83-year-old son which takes care of.             Allergies:     Allergies   Allergen Reactions   ??? Penicillins Hives     Has patient had a PCN reaction causing immediate rash, facial/tongue/throat swelling, SOB or lightheadedness with hypotension: Yes  Has patient had a PCN reaction causing severe rash involving mucus membranes or skin necrosis: Unk  Has patient had a PCN reaction that required hospitalization: Yes  Has patient had a PCN reaction occurring within the last 10 years: No  If all of the above answers are NO, then may proceed with Cephalosporin use.  Has patient had a PCN reaction causing immediate rash, facial/tongue/throat swelling, SOB or lightheadedness with hypotension: Yes  Has patient had a PCN reaction causing severe rash involving mucus membranes or skin necrosis: Unk  Has patient had a PCN reaction that required hospitalization: Yes  Has patient had a PCN reaction occurring within the last 10 years: No  If all of the above answers are NO, then may proceed with Cephalosporin use.   ??? Doxycycline Hcl Other (See Comments)     Reaction not recalled             Medications:     Current Outpatient Medications   Medication Sig Dispense Refill   ??? acetaminophen (TYLENOL) 500 MG tablet Take 1,000 mg by mouth every six (6) hours as needed for pain.     ??? cyanocobalamin 1,000 mcg/mL injection Inject 1 mL (1,000 mcg total) under the skin every thirty (30) days. 4 mL 2   ??? cyclobenzaprine (FLEXERIL) 5 MG tablet Take 5 mg by mouth Four (4) times a day.     ??? diphenoxylate-atropine (LOMOTIL) 2.5-0.025 mg per tablet Take 1 tablet by mouth Three (3) times a day as needed for diarrhea. 60 tablet 1   ??? DULoxetine (CYMBALTA) 60 MG capsule Take 1 capsule (60 mg total) by mouth daily. 30 capsule 11   ??? empty container Misc Use as directed 1 each PRN   ??? famotidine (PEPCID) 20 MG tablet Take 1 tablet (20 mg total) by mouth Two (2) times a day. 180 tablet 3   ??? gabapentin (NEURONTIN) 800 MG tablet Take 800 mg by mouth Three (3) times a day.     ??? hydroxyzine (ATARAX) 50 mg capsule/tablet Take 1 each (50 mg total) by mouth Three (3) times a day as needed. 90 tablet 5   ??? magnesium oxide (MAG-OX) 400 mg (241.3 mg magnesium) tablet Take 2 tablets (800 mg total) by mouth Two (2) times a day. (Patient taking differently: Take 800 mg by mouth Two (2) times a day. As needed) 120 tablet 5   ??? mirtazapine (REMERON) 30 MG tablet Take 1 tablet (30 mg total) by mouth nightly. 30 tablet 5   ??? ondansetron (ZOFRAN-ODT) 8 MG disintegrating tablet Dissolve 1 tablet (8 mg total) on the tongue every eight (8) hours as needed for nausea. 60 tablet 1   ??? potassium chloride (KLOR-CON) 20 MEQ CR tablet Take 1 tablet (20 mEq total) by mouth daily. (Patient taking differently: Take 20 mEq by mouth daily. As needed) 30 tablet 5   ??? syringe with needle (BD LUER-LOK SYRINGE) 3 mL 25 x 5/8 Syrg Use to inject under the skin every thirty (30) days. 25 each 0   ??? ustekinumab (STELARA)  90 mg/mL Syrg syringe Inject the contents of 1 syringe under the skin every 8 weeks 1 mL 3   ??? cholestyramine (QUESTRAN) 4 gram packet Take 1 packet (4 g total) by mouth two (2) times a day. Separate from other medications by 1-2 hours. (Patient not taking: Reported on 02/27/2019) 60 packet 5   ??? cimetidine (TAGAMET) 400 MG tablet Take 1 tablet (400 mg total) by mouth Four (4) times a day. (Patient not taking: Reported on 02/27/2019) 120 tablet 5   ??? ergocalciferol (DRISDOL) 50,000 unit capsule Take 1 capsule (50,000 Units total) by mouth once a week. 8 capsule 0   ??? ergocalciferol (VITAMIN D2) 1,250 mcg (50,000 unit) capsule TAKE 1 CAPSULE BY MOUTH ONCE A WEEK (Patient not taking: Reported on 02/27/2019) 4 capsule 0   ??? potassium chloride SA (K-DUR,KLOR-CON) 20 MEQ tablet Take 1 tablet (20 mEq total) by mouth Two (2) times a day. (Patient not taking: Reported on 02/27/2019) 60 tablet 2     No current facility-administered medications for this visit.              Physical Exam:     Physical exam was deferred in the setting of telephone visit.          Labs, Data & Indices:     Lab Review:   Lab Results   Component Value Date    WBC 17.1 (H) 11/20/2018 WBC 8.6 02/20/2014    RBC 4.47 11/20/2018    RBC 4.09 02/20/2014    HGB 12.7 11/20/2018    HGB 11.6 (L) 02/20/2014     Lab Results   Component Value Date    AST 20 11/20/2018    AST 21 02/18/2014    ALT 20 11/20/2018    ALT 15 02/18/2014    BUN 16 11/20/2018    BUN 8 03/18/2014    Creatinine 0.89 11/20/2018    Creatinine 0.86 03/18/2014    CO2 15.0 (L) 11/20/2018    CO2 27 03/18/2014    Albumin 3.6 11/20/2018    Albumin 3.9 03/18/2014    Calcium 9.7 11/20/2018    Calcium 7.3 (L) 03/18/2014     Lab Results   Component Value Date    TSH 4.164 (H) 11/20/2018    TSH 4.50 (H) 03/18/2014      HBsAg neg   HBcAb total neg  HBsAb neg  Quantiferon gold neg  TPMT normal  ............................................................................................................................................Marland Kitchen          Assessment & Recommendations:     Mary Hawkins is a 38 y.o. female with history of stricturing??ileal Crohn's disease??s/p ileocecal resection, mesh retropexy for rectal prolapse, incidental Meckel's diverticulum s/p resection 11/13/13 who I spoke to today for follow up. She is now maintained on ustekinumab 90 mg [place] every 8 weeks (restarted in January 2020 after self-discontinuing in September 2019) and continues to smoke cigarettes. I suspect that she still does have some active Crohn's disease based upon her symptoms so I will set her up for colonoscopy as well as EGD given significant reflux symptoms and history of Grade D esophagitis. She was previously on PPI but was having severe hypomagnesemia so we had elected to d/c PPI and change to H2 blocker but this strategy has not been effective so will switch back to PPI.     She also has had weight gain, some of which may be medication-induced from Remeron. As a result, will have her discontinue this as she has not had great benefit this. I  will send Ottie Glazier a message to help coordinate a visit with Psychiatry as we had agreed that [name] was too complex for the COCM program.     PLAN:  1. Crohn's disease:  - continue ustekinumab 90 mg [place] every 8 weeks, may need to adjust dosing  - colonoscopy ordered to restage disease   - will hold off on CTE which was ordered in 11/2018  - no need for labs today as reviewed studies in CareEverywhere from last month    2. Vitamin B12 deficiency:  - continue monthly cyanocobalamin injections    3. Hypomagnesemia:   - continue mag oxide 800 mg BID, can't increase more d/t worsening diarrhe  - previously stopped PPI which helped but due to worsening GERD we will have to restart and monitor Mg    4. GERD, h/o esophagitis:  - restart Protonix 40 mg daily  - EGD ordered to be done at time of colonoscopy    5. Tobacco abuse - counseled extensively on the need for tobacco cessation for her Crohn's disease. Tobacco cessation is as important as starting a new medication.    6. Chronic abdominal pain, other body pains, history of fibromyalgia   - continue Cymbalta  - EGD/colonoscopy as above     7. Depression, anxiety:  - Cymbalta 60 mg daily  - stop Remeron d/t weight gain  - Atarax 2-3 times daily PRN  - messaged Stephanie to help arrange Psychiatry appointment for ongoing management    8. Weight gain:  - stop Remeron    9. Prevention - discussed importance of tobacco cessation as above. Previously discussed need for annual influenza vaccine, pneumococcal vaccine series, and hepatitis B vaccine series. She reports being up to date on tetanus shot. She received Prevnar 01/19/18 as well as first hepatitis B vaccine 01/19/18. Will need Pneumovax and next hepatitis B vaccine at next in person visit. I also suspect that she has osteoporosis given hip fracture, will try to get DEXA scan locally and likely refer to Endocrine.    10. Follow up in 3 months or sooner as needed, will order endoscopies at that time.  --------------------------------------------  Mardee Postin, M.D.  [facility]  Multidisciplinary Inflammatory Bowel Disease Clinic     I spent 28 minutes on the phone with the patient. I spent an additional 20 minutes on pre- and post-visit activities.     The patient was physically located in [place] or a state in which I am permitted to provide care. The patient and/or parent/guardian understood that s/he may incur co-pays and cost sharing, and agreed to the telemedicine visit. The visit was reasonable and appropriate under the circumstances given the patient's presentation at the time.    The patient and/or parent/guardian has been advised of the potential risks and limitations of this mode of treatment (including, but not limited to, the absence of in-person examination) and has agreed to be treated using telemedicine. The patient's/patient's family's questions regarding telemedicine have been answered.     If the visit was completed in an ambulatory setting, the patient and/or parent/guardian has also been advised to contact their provider???s office for worsening conditions, and seek emergency medical treatment and/or call 911 if the patient deems either necessary.

## 2019-03-01 NOTE — Unmapped (Addendum)
1. We will work to set you up for colonoscopy and upper endoscopy. We can do these in place of CT scan for now.  2. Continue Stelara every 8 weeks  3. Stop Remeron as this doesn't seem to be helping and may be contributing to your weight gain  4. I will send Judeth Cornfield a message to see if we can get you scheduled with Psychiatry for help with medications  5. Okay to stop Tagamet. I sent a prescription for Protonix for your heartburn to the pharmacy. We will have to watch your magnesium level closely because it was quite low in the past when you were on this.   6. Will see if we can get bone density scan at the imaging center closer to home for you. Cotninue daily over the counter vitamin D supplement.

## 2019-03-01 NOTE — Unmapped (Signed)
Called [place] [facility] (ph. [phone])  Fax: [phone]    Dexa scan order faxed to [facility] imaging on 03/01/2019

## 2019-03-04 ENCOUNTER — Other Ambulatory Visit: Payer: Self-pay | Admitting: Internal Medicine

## 2019-03-04 DIAGNOSIS — Z7952 Long term (current) use of systemic steroids: Secondary | ICD-10-CM

## 2019-03-04 MED ORDER — PANTOPRAZOLE 40 MG TABLET,DELAYED RELEASE
ORAL_TABLET | Freq: Every day | ORAL | 1 refills | 90 days | Status: CP
Start: 2019-03-04 — End: 2020-03-03
  Filled 2019-03-06: qty 90, 90d supply, fill #0

## 2019-03-05 ENCOUNTER — Telehealth: Payer: Self-pay | Admitting: Neurology

## 2019-03-05 MED ORDER — FAMOTIDINE 40 MG TABLET
Freq: Two times a day (BID) | ORAL | 3 refills | 90 days | Status: CP
Start: 2019-03-05 — End: 2020-03-04
  Filled 2019-03-06: qty 30, 30d supply, fill #0

## 2019-03-05 MED ORDER — GABAPENTIN 800 MG PO TABS: 800.0000 mg | ORAL_TABLET | Freq: Four times a day (QID) | ORAL | 3 refills | Status: DC

## 2019-03-05 MED ORDER — PRAMIPEXOLE DIHYDROCHLORIDE 0.75 MG PO TABS: 0.7500 mg | ORAL_TABLET | Freq: Every day | ORAL | 1 refills | Status: DC

## 2019-03-05 NOTE — Unmapped (Addendum)
Behavioral Health Collaborative Care Management Psychiatric Consultation    Consultation with [facility] Manager in regards to patient's behavioral health needs. Reviewed [facility] care plan and discussed patient treatment progress.    38 year old with a history of Crohn's disease, chronic pain, depression and anxiety.????Current medication regiment: Cymbalta 60 mg po daily, Remeron 30 mg nightly, Hydroxyzine 50 mg PRN.   ??  Interval hx per CM:   CM has been unable to connect with patient.  Per GI visit with Dr. [name] on 7/23 -- She was recently admitted to an OSH one month ago for a hip fracture and required placement of two screws.  At apt, she requested an increase in Remeron and Cymbalta.  She does not feel like Remeron is helping including sleep.    Upon further chart review -- she had a history of seizure like activity in setting of small SAH and also found to have severe hypomagnesemia.  Cymbalta was reduced to once daily dosing from twice daily dosing.      Revision to care plan recommended as follows:  Suspect that the seizure like activity in March was likely related to electrolyte disturbances and SAH rather than secondary to Cymbalta.   Pt is due for repeat PHQ9 and GAD7 (last done in 12/06/2018 which were PHQ9= 18 and GAD7= 21) and CM to reach out to assess current symptoms.    ?? Given patient identifies worsening mood (based on request for higher doses with GI provider)-- would be reasonable to consider further increase of Cymbalta to 90 mg po daily (from 60 mg daily)   ?? Continue Remeron 30 mg nightly  ?? CM to continue to support patient as able with behavioral strategies until she can be established with a psychiatrist      This case review was conducted with information gathered from chart and from care manager information.  I have not examined the patient directly.  Physician should incorporate recommendations as appropriate given full current clinical picture.    Recommendations will be sent to GI provider Dr. [name] and RN patient coordinator Neta Mends.    Dr. Lonell Face was present during the case consultation review and is in agreement with above recommendations and plan.    Karie Chimera, MD  03/04/2019

## 2019-03-05 NOTE — Telephone Encounter (Signed)
Pt is asking if the strength of the gabapentin (NEURONTIN) 800 MG tablet can be increased.  Pt also states what was prescribed for her Restless leg is not working, please call

## 2019-03-05 NOTE — Telephone Encounter (Signed)
I called the patient.  The patient still having restless leg symptoms, she has discomfort in the legs at night but when she gets up and walks it goes away.  She is on 0.5 mg of Mirapex at night, otherwise tolerating the drug.  We will go to 0.75 mg, I will call in a new prescription for this.  We can maximize the gabapentin to 800 mg 4 times daily, cannot go higher than this.

## 2019-03-05 NOTE — Addendum Note (Signed)
Addended by: Kathrynn Ducking on: 03/05/2019 03:58 PM   Modules accepted: Orders

## 2019-03-05 NOTE — Telephone Encounter (Signed)
error 

## 2019-03-06 MED FILL — DIPHENOXYLATE-ATROPINE 2.5 MG-0.025 MG TABLET: ORAL | 20 days supply | Qty: 60 | Fill #1

## 2019-03-06 MED FILL — MIRTAZAPINE 30 MG TABLET: ORAL | 30 days supply | Qty: 30 | Fill #4

## 2019-03-06 MED FILL — DULOXETINE 60 MG CAPSULE,DELAYED RELEASE: 30 days supply | Qty: 30 | Fill #3 | Status: AC

## 2019-03-06 MED FILL — MIRTAZAPINE 30 MG TABLET: 30 days supply | Qty: 30 | Fill #4 | Status: AC

## 2019-03-06 MED FILL — DIPHENOXYLATE-ATROPINE 2.5 MG-0.025 MG TABLET: 20 days supply | Qty: 60 | Fill #1 | Status: AC

## 2019-03-06 MED FILL — FAMOTIDINE 40 MG TABLET: 30 days supply | Qty: 30 | Fill #0 | Status: AC

## 2019-03-06 MED FILL — DULOXETINE 60 MG CAPSULE,DELAYED RELEASE: ORAL | 30 days supply | Qty: 30 | Fill #3

## 2019-03-06 MED FILL — HYDROXYZINE HCL 50 MG TABLET: 30 days supply | Qty: 90 | Fill #4 | Status: AC

## 2019-03-06 MED FILL — PANTOPRAZOLE 40 MG TABLET,DELAYED RELEASE: 90 days supply | Qty: 90 | Fill #0 | Status: AC

## 2019-03-06 MED FILL — HYDROXYZINE HCL 50 MG TABLET: ORAL | 30 days supply | Qty: 90 | Fill #4

## 2019-03-07 NOTE — Unmapped (Signed)
Behavioral Health Care Management          Date of Service:  03/07/2019        Date of Last Encounter:  03/05/2019    Purpose of contact:     GI Social Worker contacted the patient as part of the BHCMP.   Patient has a Depression Composite Score of 1      Services provided:   []  Behavioral Health Care assessment   []  Behavioral Health Care plan developed collaboratively with patient to be reviewed by GI Physician   [x]  Care Management contact with patient addressing care plan goals     []  Coordination/Facilitation of care with other providers  []  PCP, [facility]  []  GI Physician, Dr. [name]  []  Consulting Psychiatrist, Dr. Lucienne Capers  []  Treatment progress monitoring/review:   []  Administered PHQ-9/GAD-7 or other applicable validated rating scale   []  Reviewed results of recent PHQ-9/GAD -7 with patient   []  Patient making progress- no revision to care plan needed   []  Revisions to care plan:       Specific Interventions with patient during this encounter:   [x]  Not applicable  []  Problem Solving Skills  []  Behavioral Activation  []  Brief Distress Tolerance Skill building  []  Mindfulness based stress management skills  []  Non-pharmacological approaches to improving sleep  []  Assist in development of a Crisis Management plan  []  Motivational Interviewing  []  Brief CBT   []  Other:     Progress/Outcome:    PHQ-9 PHQ-9 TOTAL SCORE   01/08/2019 0   12/06/2018 18   10/10/2018 17   08/14/2018 13        GAD7 Total Score GAD-7 Total Score   12/06/2018 21   10/10/2018 21   08/14/2018 11         Notified pt that their appointment with [facility] is rescheduled to Tuesday, 9/8 at 1:15 pm with Dr. Burtis Junes. Pt verbalized understanding and confirmed that this appt coincides with her schedule. F/u call scheduled with pt for Monday, 8/3, to f/u mood and sleep issues.       Follow Up Plan/Next Steps:    []  Schedule follow up appointment with GI Physician  []  Provide referral/linkage information:  []  Consult with GI Physician, re: care plan and patient's treatment progress  [x]  Other: f/u Monday, 03/11/2019, administer PHQ9/GAD7/ SCDAI        Additional Information/Plan:  Pt was provided with my direct office phone number should additional needs arise.    Future Appointments   Date Time Provider Department Center   03/13/2019  9:15 AM [facility] COVID PRE TEST [facility] PROVIDER RESPDIAGCACC [facility]   04/16/2019  1:15 PM Roselie Skinner, MD PSYADTC [facility]       Ottie Glazier, MSW, M.Ed, LCSWA  Social Worker- [facility]  [phone]

## 2019-03-08 NOTE — Unmapped (Signed)
A minimum of 20 minutes of integrated behavioral health services have been provided during this calendar month. See previous patient outreach encounter notes for details of services and durations.

## 2019-03-10 ENCOUNTER — Other Ambulatory Visit: Payer: Self-pay

## 2019-03-10 ENCOUNTER — Ambulatory Visit
Admission: RE | Admit: 2019-03-10 | Discharge: 2019-03-10 | Disposition: A | Discharge status: Home / Self Care | Payer: Medicaid Other | Source: Ambulatory Visit | Attending: Neurology | Admitting: Neurology

## 2019-03-10 DIAGNOSIS — G40909 Epilepsy, unspecified, not intractable, without status epilepticus: Secondary | ICD-10-CM

## 2019-03-10 MED ORDER — GADOBENATE DIMEGLUMINE 529 MG/ML IV SOLN
15.0000 mL | Freq: Once | INTRAVENOUS | Status: AC | PRN
  Administered 2019-03-10: 14:00:00 15 mL via INTRAVENOUS

## 2019-03-11 ENCOUNTER — Telehealth: Payer: Self-pay | Admitting: Neurology

## 2019-03-11 NOTE — Unmapped (Addendum)
Behavioral Health Collaborative Care Management Psychiatric Consultation    Consultation with [facility] Manager in regards to patient's behavioral health needs. Reviewed [facility] care plan and discussed patient treatment progress.    38 year old with a history of Crohn's disease,??chronic pain, depression and anxiety.????Current medication regiment: Cymbalta 60 mg po daily, Remeron 30 mg nightly, Hydroxyzine 50 mg PRN.   ??  Interval hx per CM:  Pt has an intake apt with psychiatry rescheduled for Tuesday, 9/8 at 1:15 pm with Dr. Burtis Junes.  PHQ9 14 (down from 18 on 4/30) and GAD7 12 (down from 21 on 12/06/18). Sleep is not good, does not feel like the medicines are doing anything. Continues on Cymbalta 60 mg, hydroxyzine 50 mg TID PRN (but mainly using at night).  Dr. [name] stopped Remeron at most recent visit, pt does not notice any difference.  Has been on Gabapentin in the past, now on pramipexole for restless leg syndrome.     Reviewed recent neurology note in care-everywhere from 02/07/2019: plan is for MRI brain. EEG study with no evidence of ictal or interictal discharges.  Plan is to manage patient conservatively if studies return normal.    01/08/2019 phq9 value likely an error   PHQ-9 PHQ-9 TOTAL SCORE   03/11/2019 14   01/08/2019 0   12/06/2018 18   10/10/2018 17   08/14/2018 13     GAD7 Total Score GAD-7 Total Score   03/11/2019 12   12/06/2018 21   10/10/2018 21   08/14/2018 11         Revision to care plan recommended as follows:   - Agree with discontinuation of Remeron 30 mg nightly given lack of perceived benefit.  This may have also been aggravating her restless leg syndrome  - Recommend increasing Cymbalta to 90 mg po daily to target anxiety and depression, particularly now that pt has less serotonergic burden with discontinuation of Remeron  - Continue hydroxyzine 50 mg TID PRN anxiety for now - may benefit from switching to an alternative agent in the future such as Buspar which could also have the benefit of depression augmentation.    - Will continue to engage patient in the program for targeting behavioral strategies (particularly sleep hygiene) as well as medication recommendations until patient can establish with a primary psychiatrist in September.     This case review was conducted with information gathered from chart and from care manager information.  I have not examined the patient directly.  Physician should incorporate recommendations as appropriate given full current clinical picture.    Recommendations will be sent to GI provider Dr. [name] and RN patient coordinator Neta Mends.  ??  Dr. Lonell Face was present during the case consultation review and is in agreement with above recommendations and plan.    Karie Chimera, MD  03/11/2019

## 2019-03-11 NOTE — Unmapped (Signed)
Behavioral Health Care Management          Date of Service:  03/11/2019        Date of Last Encounter:  03/07/2019    Purpose of contact:     GI Social Worker contacted the patient as part of the BHCMP.   Patient has a Depression Composite Score of 5      Mental Status/Behavioral Observations  Affect:  Anxious and Depressed   Mood:   Depressed and Anxious   Thought Process:  Logical, linear, clear, coherent, goal directed   Behavior:   Calm and Polite   Self Harm: 1. not present       Services provided:   []  Behavioral Health Care assessment   []  Behavioral Health Care plan developed collaboratively with patient to be reviewed by GI Physician  [x]  Care Management contact with patient addressing care plan goals     []  Coordination/Facilitation of care with other providers  []  PCP, [facility]  []  GI Physician, Dr. [name]  []  Consulting Psychiatrist, Dr. Lucienne Capers  [x]  Treatment progress monitoring/review:   [x]  Administered PHQ-9/GAD-7 or other applicable validated rating scale   [x]  Reviewed results of recent PHQ-9/GAD -7 with patient   []  Patient making progress- no revision to care plan needed   [x]  Revisions to care plan: pt to establish care with [facility]       Specific Interventions with patient during this encounter:   []  Not applicable  []  Problem Solving Skills  []  Behavioral Activation  []  Brief Distress Tolerance Skill building  []  Mindfulness based stress management skills  []  Non-pharmacological approaches to improving sleep  []  Assist in development of a Crisis Management plan  [x]  Motivational Interviewing  []  Brief CBT   [x]  Other: supportive counseling, active listening    Progress/Outcome:    PHQ-9 PHQ-9 TOTAL SCORE   03/11/2019 14   01/08/2019 0   12/06/2018 18   10/10/2018 17   08/14/2018 13         GAD7 Total Score GAD-7 Total Score   03/11/2019 12   12/06/2018 21   10/10/2018 21   08/14/2018 11         SCDAI: 324    Pt has virtual appointment to establish care with [facility]iat[facility] on 04/16/2019 with Dr. Burtis Junes. Virtual visits have been a barrier to her care in the past, but she now has access to a smartphone to complete visits. Her mood and issues with sleep continue to be unchanged and she denies that any of her medications are giving her any benefit. She recently stopped Remeron due to weight gain; she is currently taking Cymbalta 60 mg, Hydroxyzine 50 mg TID, Neurontin (for fibromyalgia), and Mirapex (for RLS). She continues to feel frustrated and down from her current circumstances, she had surgery on her hip on 01/13/2019 and is using a walker. She feels isolated from being physically limited and confined to her home due to her IBD and other chronic conditions. Validated pt's emotions and encouraged her to continue emotional regulation while being as active as she is able with her care- attend f/u appts with surgeon and neurology, make f/u appt with PCP, establish care with psychiatrist.     Follow Up Plan/Next Steps:    []  Schedule follow up appointment with GI Physician  []  Provide referral/linkage information:  [x]  Consult with GI Physician, re: care plan and patient's treatment progress  []  Other:  Additional Information/Plan:  Pt was provided with my direct office phone number should additional needs arise.    Future Appointments   Date Time Provider Department Center   03/13/2019  9:15 AM [facility] COVID PRE TEST [facility] PROVIDER RESPDIAGCACC [facility]   04/16/2019  1:15 PM Roselie Skinner, MD PSYADTC [facility]       Ottie Glazier, MSW, M.Ed, LCSWA  Social Worker- [facility]  [phone]

## 2019-03-11 NOTE — Telephone Encounter (Signed)
I called the patient.  The MRI of the brain was normal, EEG study was normal.  If the patient has any further seizure type events, she is to contact our office.    MRI brain 03/10/19:  IMPRESSION:   Normal MRI brain (with and without).

## 2019-03-12 MED ORDER — DULOXETINE 30 MG CAPSULE,DELAYED RELEASE
ORAL_CAPSULE | Freq: Every day | ORAL | 11 refills | 30.00000 days | Status: CP
Start: 2019-03-12 — End: 2020-03-11
  Filled 2019-03-27: qty 90, 30d supply, fill #0

## 2019-03-12 NOTE — Unmapped (Signed)
Called pt to discuss incrase cympbalta to 90mg  daily per Dr. [name].     Spoke with pt mom Misty Stanley - she will let Amoree know of the increase and requests prescription be sent to [facility] with new dosage.     F/u with pt mom regarding dexa scan. She does not think that she has had this done yet but will have Kelsee let us know. Have not received results yet.

## 2019-03-13 ENCOUNTER — Ambulatory Visit: Admit: 2019-03-13 | Discharge: 2019-03-14 | Payer: MEDICAID | Attending: Family | Primary: Family

## 2019-03-13 DIAGNOSIS — Z1159 Encounter for screening for other viral diseases: Principal | ICD-10-CM

## 2019-03-13 NOTE — Unmapped (Signed)
COVID Pre-Procedure Intake Form     Assessment     Mary Hawkins is a 38 y.o. female presenting to [facility] for COVID testing.     Plan     If no testing performed, pt counseled on routine care for respiratory illness.  If testing performed, COVID sent.  Patient directed to Home given findings during today's visit.    Subjective     Mary Hawkins is a 38 y.o. female who [facility] with complaints of the following:    Exposure History: In the last 21 days?     Have you traveled outside of [place]? No               Have you been in close contact with someone confirmed by a test to have COVID? (Close contact is within 6 feet for at least 10 minutes) No       Have you worked in a health care facility? No     Lived or worked facility like a nursing home, group home, or assisted living?    No         Are you scheduled to have surgery or a procedure in the next 3 days? Yes               Are you scheduled to receive cancer chemotherapy within the next 7 days?    No     Have you ever been tested before for COVID-19 with a swab of your nose? Yes: When: 01/2019, Where: [facility]   Are you a healthcare worker being tested so to return to work No     Right now,  do you have any of the following that developed over the past 7 days (as stated by patient on intake form):    Subjective fever (felt feverish) No   Chills (especially repeated shaking chills) No   Severe fatigue (felt very tired) No   Muscle aches No   Runny nose No   Sore throat No   Loss of taste or smell No   Cough (new onset or worsening of chronic cough) No   Shortness of breath No   Nausea or vomiting No   Headache No   Abdominal Pain No   Diarrhea (3 or more loose stools in last 24 hours) No       Scribe's Attestation: Paulita Fujita, FNP obtained and performed the history, physical exam and medical decision making elements that were entered into the chart.  Signed by Mal Amabile, LCSW serving as Scribe, on 03/13/2019 9:56 AM      The documentation recorded by the scribe accurately reflects the service I personally performed and the decisions made by me. Aida Puffer, FNP  March 13, 2019 11:31 AM

## 2019-03-14 DIAGNOSIS — K5000 Crohn's disease of small intestine without complications: Principal | ICD-10-CM

## 2019-03-14 MED ORDER — PEG-ELECTROLYTE SOLUTION 420 GRAM ORAL SOLUTION
Freq: Once | ORAL | 0 refills | 1 days | Status: CP
Start: 2019-03-14 — End: 2019-03-16

## 2019-03-15 ENCOUNTER — Encounter: Admit: 2019-03-15 | Discharge: 2019-03-16 | Payer: MEDICAID | Attending: Anesthesiology | Primary: Anesthesiology

## 2019-03-15 ENCOUNTER — Ambulatory Visit: Admit: 2019-03-15 | Discharge: 2019-03-16 | Payer: MEDICAID

## 2019-03-15 DIAGNOSIS — K5000 Crohn's disease of small intestine without complications: Principal | ICD-10-CM

## 2019-03-15 MED ORDER — METRONIDAZOLE 500 MG TABLET
ORAL_TABLET | Freq: Two times a day (BID) | ORAL | 0 refills | 14 days | Status: CP
Start: 2019-03-15 — End: 2019-03-29

## 2019-03-15 MED ORDER — CIPROFLOXACIN 500 MG TABLET
ORAL_TABLET | Freq: Two times a day (BID) | ORAL | 0 refills | 14 days | Status: CP
Start: 2019-03-15 — End: 2019-03-29

## 2019-03-15 NOTE — Unmapped (Signed)
Called radiology and scheuled CT for pt at [facility] on 8/12 at 1:30 arrival time of 1:30. Msg sent to pt via mychart

## 2019-03-15 NOTE — Unmapped (Signed)
Addendum  created 03/15/19 1419 by Otelia Santee, MD    Intraprocedure Event edited

## 2019-03-16 NOTE — Unmapped (Signed)
Called Mary Hawkins to follow up on today's colonoscopy results. She is scheduled for CTE next week at [facility] so I alerted her of that appointment. We will see what her CT scan shows - if inflammation/stenosis limited to anastomosis then I think we need to strongly consider surgical resection of the area. I am somewhat concerned that she already has issues with electrolyte balance (particularly hypoMg) heading into surgery but this has actually not been a problem as of late. If longer segment of inflammation/stenosis, then we will likely start infliximab/azathioprine and consider dilation of anastomosis under fluoroscopy if inflammation has healed vs surgery after downstaging disease. Regardless I don't think ustekinumab is working for her. I offered her a course of budesonide vs prednisone given inflammation noted on today's exam but she really wants to avoid further steroids as has gained a significant amount of weight this year. She would prefer to see results of CT scan first. 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 Epifania Gore. 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     We discussed risks of infliximab/azathiproine therapy today and I sent her detailed information about these medications as well.     Mary Hawkins voiced understanding of plan.

## 2019-03-20 ENCOUNTER — Ambulatory Visit: Admit: 2019-03-20 | Discharge: 2019-03-21 | Payer: MEDICAID

## 2019-03-20 DIAGNOSIS — K50919 Crohn's disease, unspecified, with unspecified complications: Principal | ICD-10-CM

## 2019-03-21 ENCOUNTER — Institutional Professional Consult (permissible substitution): Admit: 2019-03-21 | Discharge: 2019-03-22 | Payer: MEDICAID | Attending: Internal Medicine | Primary: Internal Medicine

## 2019-03-21 DIAGNOSIS — N949 Unspecified condition associated with female genital organs and menstrual cycle: Secondary | ICD-10-CM

## 2019-03-21 DIAGNOSIS — Z720 Tobacco use: Secondary | ICD-10-CM

## 2019-03-21 DIAGNOSIS — Z0000 Encounter for general adult medical examination without abnormal findings: Secondary | ICD-10-CM

## 2019-03-21 DIAGNOSIS — K50018 Crohn's disease of small intestine with other complication: Principal | ICD-10-CM

## 2019-03-21 NOTE — Unmapped (Signed)
1 

## 2019-03-21 NOTE — Unmapped (Signed)
[facility]  TELEPHONE NOTE - INFLAMMATORY BOWEL DISEASE  03/21/2019    Demographics:  Mary Hawkins is a 38 y.o. female with Crohn's disease    Referring physician:   [facility]          HPI / NOTE :     Today, I spoke to Mary Hawkins for follow up consultation in the [facility] at the request of Mary Hawkins regarding Crohn's Disease.  Outside records including available clinical notes, endoscopy reports, imaging results and pathology results were reviewed in detail as part of this follow up consultation.     Phone visit due to COVID-19 pandemic. Patient consented to phone visit.  Time spent on phone 15 minutes   Called [phone]    HPI:  I spoke to Mary Hawkins on the phone today for follow up. She just had colonoscopy last week showing Rutgeert's i4 anastomosis. Dr. Erma Heritage present at time of procedure as well and felt surgery best option for [facility]. Mary Hawkins continues to struggle with abdominal pain, bloating, nausea but has not been vomiting. She still has frequent BMs without any blood. She is not taking Questran as prescribed. Weight has leveled off although did have weight gain this Mary.    She had CTE showing 5-10 cm inflammation in neo-terminal ileum and also a 4.9 x 5.9 cm right complex adnexal cyst. She hasn't seen OB-GYN in a long time and is overdue for pap smear as well. She would prefer to have further imaging studies done at [facility] as this is much closer for her.     At time of colonoscopy, I also noticed that she appeared to have sinus tract vs fistula in perianal area which had not been present before. She was tender in the area so we started on cipro/flagyl which she has been taking. Area not as tender this week.    She is smoking cigarettes but is very motivated to stop smoking right now. She understands how important this is for her Crohn's disease. I placed a referral last week to tobacco cessation team but she hasn't heard from them yet.     Stressful having son at home with homeschooling as well due to COVID-19.    Review of Systems: positive as per HPI   Otherwise, the balance of 10 systems is negative.          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 02/2010 start of Humira. 03/2011 stop Humira due to recurrent skin infections, started Entocort, issues with rectal prolapse and found to have ileal stricture. 11/13/13 ileocecal resection with Dr. Epifania Gore (28 cm of ileum resected, 5 cm colon resected - path c/w Crohn's disease but no granulomas), also with incidental Meckel's diverticulum noted and resected, mesh rectopexy for rectal prolapse. 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 03/18/14 discussed Remicade/Imuran but did not start these medications. 5/1-5/15/19 admission to [facility] for ARDS 2/2 rhinovirus requiring mechanical ventilation, AKI. 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 02/16/18 IV loading dose of ustekinumab followed by ustekinumab 90 mg [place] every 8 weeks; self-discontinued  ustekinumab. 08/2018 repeat IV load ustekinumab. 01/2019 hip fracture. 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 CTE with 5-10 cm ileal disease, 4.9 x 5.9 cm complex R adnexal cyst.      Endoscopy:      Colon 03/15/19: Diffuse inflammation with large deep lesions and narrowing at anastomosis, unable to traverse. External hemorrhoids and possible sinus/fistula tract found on perianal exam.     EGD 03/15/19: Normal esophagus. Mild gastritis. Normal duodenum.     Colon 12/27/17: Mild inflammation and a few ulcerations at ileocolonic anastomosis. Rutgeert's i2. Localized mildly granular mucosa in the sigmoid colon.  - path: Small bowel - patchy crypt architectural distortion with Paneth cell hyperplasia, consistent with mild chronic active enteritis, negative for dysplasia, no granulomas. Colon - unremarkable colonic mucosa.   EGD 12/27/17: Grade D esophagitis, small hiatal hernia, otherwise normal stomach and duodenum.   - path: ulceration with inflamed granulation tissue, no granulomas  Colon 02/20/14: Multiple discrete ulcerations seen corresponding to Rutgeert's i2.   EGD 11/12/13: Normal esophagus. Markedly thickened gastric folds without inflammation seen. Normal duodenum.     Imaging:    CTE 03/20/19: Sequela of ileocecal resection with anastomosis. Mild wall thickening involving 5-10 cm segment of neo-terminal ileum. Redundant sigmoid colon. Complex cystic right adnexal lesion versus multiple ovarian cysts. Recommend dedicated pelvic ultrasound for further evaluation.    CT Head 10/23/18: Resolution of the previously seen right frontal subarachnoid  hemorrhage. No acute intracranial abnormality.    CT Head/CT Cervical Spine 10/20/18: Suggestion of tiny focus of acute subarachnoid hemorrhage in the right posterior frontal convexity. This may represent a tiny surface contusion. Normal alignment of the cervical spine. No acute displaced fractures identified.    CT Abd/Pelvis 08/25/18: Multiple fluid-filled loops of small bowel and fluid throughout the colon as can be seen with enterocolitis. Otherwise no acute abdominal or pelvic pathology. Left ovarian cyst.    CT A/P 12/24/17: Mild wall thickening of the neoterminal ileum, possibly mild ileus. Right lower quadrant adenopathy, probably reactive.    Echo 12/09/17: - Left ventricle: The cavity size was normal. Wall thickness was   ??increased in a pattern of mild LVH. Systolic function was   ??vigorous. The estimated ejection fraction was in the range of 65%   ??to 70%. Doppler parameters are consistent with abnormal left   ??ventricular relaxation (grade 1 diastolic dysfunction).  - Pulmonary arteries: PA peak pressure: 42 mm Hg (S).    Prior IBD medications (type, dose, duration, response):  - prednisone  - Entocort  - adalimumab 2011-2012, good response but developed recurrent skin infections prompting discontinuation  - ustekinumab 02/2018 - 04/2018 before self discontinued, reload 08/2018 and now 90 mg  every 8 weeks             Past Medical History:   Past medical history:   Past Medical History:   Diagnosis Date   ??? Anxiety    ??? Arthritis    ??? Back pain    ??? Crohn disease (CMS-HCC)    ??? Eczema    ??? Endometriosis    ??? Fibromyalgia    ??? GERD (gastroesophageal reflux disease)    ??? Headache(784.0)    ??? Hypertension    ??? Ulcerative colitis (CMS-HCC)      Past surgical history:   Past Surgical History:   Procedure Laterality Date   ??? BREAST RECONSTRUCTION  2004   ??? ENDOMETRIAL ABLATION     ??? HIP SURGERY     ???  PR COLONOSCOPY FLX DX W/COLLJ SPEC WHEN PFRMD N/A 11/12/2013    Procedure: COLONOSCOPY, FLEXIBLE, PROXIMAL TO SPLENIC FLEXURE; DIAGNOSTIC, W/WO COLLECTION SPECIMEN BY BRUSH OR WASH;  Surgeon: Teodoro Spray, MD;  [facility];  Service: Gastroenterology   ??? PR COLONOSCOPY FLX DX W/COLLJ SPEC WHEN PFRMD N/A 02/20/2014    Procedure: COLONOSCOPY, FLEXIBLE, PROXIMAL TO SPLENIC FLEXURE; DIAGNOSTIC, W/WO COLLECTION SPECIMEN BY BRUSH OR WASH;  Surgeon: Billie Ruddy, MD;  Location: GI [facility]URES [facility];  Service: Gastroenterology   ??? PR COLONOSCOPY FLX DX W/COLLJ SPEC WHEN PFRMD N/A 12/27/2017    Procedure: COLONOSCOPY, FLEXIBLE, PROXIMAL TO SPLENIC FLEXURE; DIAGNOSTIC, W/WO COLLECTION SPECIMEN BY BRUSH OR WASH;  Surgeon: Bronson Curb, MD;  Location: GI [facility]URES [facility];  Service: Gastroenterology   ??? PR COLONOSCOPY FLX DX W/COLLJ SPEC WHEN PFRMD N/A 03/15/2019    Procedure: COLONOSCOPY, FLEXIBLE, PROXIMAL TO SPLENIC FLEXURE; DIAGNOSTIC, W/WO COLLECTION SPECIMEN BY BRUSH OR WASH;  Surgeon: Thurmon Fair, MD;  Location: GI PROCEDURES [facility];  Service: Gastroenterology   ??? PR REMVL COLON & TERM ILEUM W/ILEOCOLOSTOMY N/A 11/13/2013    Procedure: Ileocecectomy, meckel's diverticulectomy, mesh rectopexy;  Surgeon: Emilia Beck, MD;  Location: MAIN OR [place];  Service: Gastrointestinal   ??? PR UPPER GI ENDOSCOPY,BIOPSY N/A 11/12/2013    Procedure: UGI ENDOSCOPY; WITH BIOPSY, SINGLE OR MULTIPLE;  Surgeon: Teodoro Spray, MD;  Location: [facility];  Service: Gastroenterology   ??? PR UPPER GI ENDOSCOPY,BIOPSY N/A 12/27/2017    Procedure: UGI ENDOSCOPY; WITH BIOPSY, SINGLE OR MULTIPLE;  Surgeon: Bronson Curb, MD;  Location: [facility];  Service: Gastroenterology   ??? PR UPPER GI ENDOSCOPY,BIOPSY N/A 03/15/2019    Procedure: UGI ENDOSCOPY; WITH BIOPSY, SINGLE OR MULTIPLE;  Surgeon: Thurmon Fair, MD;  Location: [facility];  Service: Gastroenterology   ??? RECTAL PROLAPSE REPAIR       Family history:   Family History   Problem Relation Age of Onset   ??? Esophageal cancer Maternal Grandfather    ??? Cancer Maternal Grandfather         Gastric cancer     Social history: Single - has son. She is currently smoking 1/2 ppd (down from 1 ppd) - she is very encouraged to quit smoking currently. Occasional alcohol use. No illicit drug use.   Social History     Socioeconomic History   ??? Marital status: Single     Spouse name: None   ??? Number of children: None   ??? Years of education: None   ??? Highest education level: None   Occupational History   ??? None   Social Needs   ??? Financial resource strain: None   ??? Food insecurity     Worry: None     Inability: None   ??? Transportation needs     Medical: None     Non-medical: None   Tobacco Use   ??? Smoking status: Current Every Day Smoker     Packs/day: 0.50     Years: 15.00     Pack years: 7.50     Types: Cigarettes   ??? Smokeless tobacco: Never Used   ??? Tobacco comment: stopped in 2019 restarted back   Substance and Sexual Activity   ??? Alcohol use: Yes     Frequency: 2-4 times a month     Drinks per session: 1 or 2     Comment: socially   ??? Drug use:  No   ??? Sexual activity: None   Lifestyle   ??? Physical activity     Days per week: None     Minutes per session: None   ??? Stress: None   Relationships   ??? Social Wellsite geologist on phone: None     Gets together: None     Attends religious service: None     Active member of club or organization: None     Attends meetings of clubs or organizations: None     Relationship status: None   Other Topics Concern   ??? None   Social History Narrative    Currently unemployed.  She is a 44-year-old son which takes care of.             Allergies:     Allergies   Allergen Reactions   ??? Penicillins Hives     Has patient had a PCN reaction causing immediate rash, facial/tongue/throat swelling, SOB or lightheadedness with hypotension: Yes  Has patient had a PCN reaction causing severe rash involving mucus membranes or skin necrosis: Unk  Has patient had a PCN reaction that required hospitalization: Yes  Has patient had a PCN reaction occurring within the last 10 years: No  If all of the above answers are NO, then may proceed with Cephalosporin use.  Has patient had a PCN reaction causing immediate rash, facial/tongue/throat swelling, SOB or lightheadedness with hypotension: Yes  Has patient had a PCN reaction causing severe rash involving mucus membranes or skin necrosis: Unk  Has patient had a PCN reaction that required hospitalization: Yes  Has patient had a PCN reaction occurring within the last 10 years: No  If all of the above answers are NO, then may proceed with Cephalosporin use.   ??? Doxycycline Hcl Other (See Comments)     Reaction not recalled             Medications:     Current Outpatient Medications   Medication Sig Dispense Refill   ??? acetaminophen (TYLENOL) 500 MG tablet Take 1,000 mg by mouth every six (6) hours as needed for pain.     ??? ciprofloxacin HCl (CIPRO) 500 MG tablet Take 1 tablet (500 mg total) by mouth every twelve (12) hours for 14 days. 28 tablet 0   ??? cyanocobalamin 1,000 mcg/mL injection Inject 1 mL (1,000 mcg total) under the skin every thirty (30) days. 4 mL 2   ??? cyclobenzaprine (FLEXERIL) 5 MG tablet Take 5 mg by mouth Four (4) times a day.     ??? diphenoxylate-atropine (LOMOTIL) 2.5-0.025 mg per tablet Take 1 tablet by mouth Three (3) times a day as needed for diarrhea. 60 tablet 1   ??? gabapentin (NEURONTIN) 800 MG tablet Take 800 mg by mouth Three (3) times a day.     ??? hydroxyzine (ATARAX) 50 mg capsule/tablet Take 1 each (50 mg total) by mouth Three (3) times a day as needed. 90 tablet 5   ??? metroNIDAZOLE (FLAGYL) 500 MG tablet Take 1 tablet (500 mg total) by mouth two (2) times a day for 14 days. 28 tablet 0   ??? ondansetron (ZOFRAN-ODT) 8 MG disintegrating tablet Dissolve 1 tablet (8 mg total) on the tongue every eight (8) hours as needed for nausea. 60 tablet 1   ??? pantoprazole (PROTONIX) 40 MG tablet Take 1 tablet (40 mg total) by mouth daily. 90 tablet 1   ??? cholestyramine (QUESTRAN) 4 gram packet Take 1 packet (4 g  total) by mouth two (2) times a day. Separate from other medications by 1-2 hours. (Patient not taking: Reported on 02/27/2019) 60 packet 5   ??? DULoxetine (CYMBALTA) 30 MG capsule Take 3 capsules (90 mg total) by mouth daily. 90 capsule 11   ??? empty container Misc Use as directed 1 each PRN   ??? ergocalciferol (DRISDOL) 50,000 unit capsule Take 1 capsule (50,000 Units total) by mouth once a week. 8 capsule 0   ??? ergocalciferol (VITAMIN D2) 1,250 mcg (50,000 unit) capsule TAKE 1 CAPSULE BY MOUTH ONCE A WEEK (Patient not taking: Reported on 03/20/2019) 4 capsule 0   ??? famotidine (PEPCID) 40 MG tablet Take 1/2 tablet (20 mg total) by mouth Two (2) times a day. (Patient not taking: Reported on 03/20/2019) 90 each 3   ??? magnesium oxide (MAG-OX) 400 mg (241.3 mg magnesium) tablet Take 2 tablets (800 mg total) by mouth Two (2) times a day. (Patient not taking: Reported on 03/15/2019) 120 tablet 5   ??? mirtazapine (REMERON) 30 MG tablet Take 1 tablet (30 mg total) by mouth nightly. (Patient not taking: Reported on 03/15/2019) 30 tablet 5   ??? potassium chloride (KLOR-CON) 20 MEQ CR tablet Take 1 tablet (20 mEq total) by mouth daily. (Patient not taking: Reported on 03/20/2019) 30 tablet 5   ??? potassium chloride SA (K-DUR,KLOR-CON) 20 MEQ tablet Take 1 tablet (20 mEq total) by mouth Two (2) times a day. (Patient not taking: Reported on 02/27/2019) 60 tablet 2   ??? syringe with needle (BD LUER-LOK SYRINGE) 3 mL 25 x 5/8 Syrg Use to inject under the skin every thirty (30) days. 25 each 0   ??? ustekinumab (STELARA) 90 mg/mL Syrg syringe Inject the contents of 1 syringe under the skin every 8 weeks 1 mL 3     No current facility-administered medications for this visit.              Physical Exam:     Physical exam was deferred in the setting of telephone visit.          Labs, Data & Indices:     Lab Review:   Lab Results   Component Value Date    WBC 17.1 (H) 11/20/2018    WBC 8.6 02/20/2014    RBC 4.47 11/20/2018    RBC 4.09 02/20/2014    HGB 12.7 11/20/2018    HGB 11.6 (L) 02/20/2014     Lab Results   Component Value Date    AST 20 11/20/2018    AST 21 02/18/2014    ALT 20 11/20/2018    ALT 15 02/18/2014    BUN 16 11/20/2018    BUN 8 03/18/2014    Creatinine 0.89 11/20/2018    Creatinine 0.86 03/18/2014    CO2 15.0 (L) 11/20/2018    CO2 27 03/18/2014    Albumin 3.6 11/20/2018    Albumin 3.9 03/18/2014    Calcium 9.7 11/20/2018    Calcium 7.3 (L) 03/18/2014     Lab Results   Component Value Date    TSH 4.164 (H) 11/20/2018    TSH 4.50 (H) 03/18/2014      HBsAg neg   HBcAb total neg  HBsAb neg  Quantiferon gold neg  TPMT normal  ............................................................................................................................................Marland Kitchen          Assessment & Recommendations:     Cadyn Rodger is a 38 y.o. female with history of stricturing??ileal Crohn's disease??s/p ileocecal resection, mesh retropexy for rectal prolapse, incidental Meckel's diverticulum s/p  resection 11/13/13 with Dr. Epifania Gore now with severe inflammation/nontraversable stenosis at anastomosis despite ustekinumab therapy. CTE confirms this is short segment so discussed with patient that best option at this point is likely surgical resection followed by initiation of infliximab + azathioprine in the post-operative period. CTE also showed 4.9 x 5.9 cm complex right adnexal cystic structure so have ordered transvaginal ultrasound which she prefers to get done locally (discussed she would need to bring disc with her) and will place referral to Gynecology for further evaluation. Ideally could have joint surgical procedure if adnexal lesion required intervention.     Again discussed importance of tobacco cessation. She is very motivated to quit smoking and I placed referral to tobacco cessation last week but she has not heard anything so will follow up on this today.     PLAN:  1. Transvaginal ultrasound, pt prefers to have this done at [facility] so will try to help arrange   2. Placed referral to Gynecology as overdue for Pap smear and to also help work up adnexal lesion; spoke with OB/GYN resident who agreed okay to start with Gyn and they will coordinate referral to Gyn Onc if needed  3. Referral back to Dr. Epifania Gore to discuss surgical resection; would then plan to start infliximab + azathioprine in post-op setting. She is due for next dose of ustekinumab in September but will let me know exact date as we will likely hold this pending surgical plan as I don't think it is doing much for her.   4. Tobacco cessation consult placed last week; message sent to [facility] through collaborative care program to see if we can help to get this appointment scheduled as she is very motivated to quit smoking which is absolutely essential with her Crohn's disease   5. Will arrange follow up based upon surgical plan to start medications; hopefully we can get her set up for infusions through [facility] in [place] as I think it will be challenging for her to come to [facility] for infliximab infusions  --------------------------------------------  Mardee Postin, M.D.  [facility] Gastroenterology & Hepatology  Multidisciplinary Inflammatory Bowel Disease Clinic     I spent 15 minutes on the phone with the patient. I spent an additional 20 minutes on pre- and post-visit activities.     The patient was physically located in [place] or a state in which I am permitted to provide care. The patient and/or parent/guardian understood that s/he may incur co-pays and cost sharing, and agreed to the telemedicine visit. The visit was reasonable and appropriate under the circumstances given the patient's presentation at the time.    The patient and/or parent/guardian has been advised of the potential risks and limitations of this mode of treatment (including, but not limited to, the absence of in-person examination) and has agreed to be treated using telemedicine. The patient's/patient's family's questions regarding telemedicine have been answered.     If the visit was completed in an ambulatory setting, the patient and/or parent/guardian has also been advised to contact their provider???s office for worsening conditions, and seek emergency medical treatment and/or call 911 if the patient deems either necessary.

## 2019-03-25 ENCOUNTER — Other Ambulatory Visit: Payer: Self-pay | Admitting: Internal Medicine

## 2019-03-26 NOTE — Unmapped (Signed)
08/18-per st request follow up with provider,and call back in 4 weeks before dis-enrolling-she states having an upper endoscopy ,and Stelara is being held until after surgery-CB

## 2019-03-27 MED FILL — DULOXETINE 30 MG CAPSULE,DELAYED RELEASE: 30 days supply | Qty: 90 | Fill #0 | Status: AC

## 2019-04-01 MED ORDER — DIPHENOXYLATE-ATROPINE 2.5 MG-0.025 MG TABLET
ORAL_TABLET | Freq: Three times a day (TID) | ORAL | 1 refills | 20 days | PRN
Start: 2019-04-01 — End: 2019-05-31

## 2019-04-01 MED FILL — DIPHENOXYLATE-ATROPINE 2.5 MG-0.025 MG TABLET: 20 days supply | Qty: 60 | Fill #0 | Status: AC

## 2019-04-01 MED FILL — DIPHENOXYLATE-ATROPINE 2.5 MG-0.025 MG TABLET: ORAL | 20 days supply | Qty: 60 | Fill #0

## 2019-04-01 NOTE — Unmapped (Signed)
Refill done per protocol for lomotil- called in to pharmacy.

## 2019-04-01 NOTE — Unmapped (Signed)
Called [place] [facility] - they request we resend [place] order with transvaginal US) however the only way we can order a non ob transvagional US is how it was ordered and faxed [place] endovagional (non-ob).    Discussed with [place] tech at [facility] imaging, they state in the comments the order MUST SAY pelvis complete with transvaginal and be re-faxed.     Refaxed order with the above description to [phone]

## 2019-04-02 NOTE — Unmapped (Signed)
Behavioral Health Care Management          Date of Service:  04/02/2019        Date of Last Encounter:  03/11/2019    Purpose of contact:     GI Social Worker contacted the patient as part of the BHCMP.   Patient has a Depression Composite Score of 5      Services provided:   []  Behavioral Health Care assessment   []  Behavioral Health Care plan developed collaboratively with patient to be reviewed by GI Physician  [x]  Care Management contact with patient addressing care plan goals     []  Coordination/Facilitation of care with other providers  []  PCP, [facility]  []  GI Physician, Dr. [name]  []  Consulting Psychiatrist, Dr. Lucienne Capers  []  Treatment progress monitoring/review:   []  Administered PHQ-9/GAD-7 or other applicable validated rating scale   []  Reviewed results of recent PHQ-9/GAD -7 with patient   []  Patient making progress- no revision to care plan needed   []  Revisions to care plan:       Specific Interventions with patient during this encounter:   [x]  Not applicable  []  Problem Solving Skills  []  Behavioral Activation  []  Brief Distress Tolerance Skill building  []  Mindfulness based stress management skills  []  Non-pharmacological approaches to improving sleep  []  Assist in development of a Crisis Management plan  []  Motivational Interviewing  []  Brief CBT   []  Other:     Progress/Outcome:    PHQ-9 PHQ-9 TOTAL SCORE   03/11/2019 14   01/08/2019 0   12/06/2018 18   10/10/2018 17   08/14/2018 13        GAD7 Total Score GAD-7 Total Score   03/11/2019 12   12/06/2018 21   10/10/2018 21   08/14/2018 11         Contacted pt to to f/u on their referral to the Tobacco Treatment Program and to help coordinate their appt. Pt declined needing assistance to schedule an appt, stating that they were busy at the moment. She will check mer mychart messages and call to schedule an appt on hew own. Pt provided appointment line for TTP: [phone].      Follow Up Plan/Next Steps:    []  Schedule follow up appointment with GI Physician  []  Provide referral/linkage information:  [x]  Consult with GI Physician, re: care plan and patient's treatment progress  []  Other:        Additional Information/Plan:  Pt was provided with my direct office phone number should additional needs arise.    Future Appointments   Date Time Provider Department Center   04/09/2019 12:30 PM [name], MD [facility]   04/16/2019  1:15 PM Roselie Skinner, MD [facility]   04/25/2019  1:40 PM Glean Salen, MD [facility]       Ottie Glazier, MSW, M.Ed, LCSWA  Social Worker- [facility]  [phone]

## 2019-04-04 ENCOUNTER — Other Ambulatory Visit: Payer: Self-pay | Admitting: Internal Medicine

## 2019-04-04 DIAGNOSIS — N949 Unspecified condition associated with female genital organs and menstrual cycle: Secondary | ICD-10-CM

## 2019-04-05 ENCOUNTER — Other Ambulatory Visit: Payer: Self-pay | Admitting: Internal Medicine

## 2019-04-05 MED FILL — HYDROXYZINE HCL 50 MG TABLET: 30 days supply | Qty: 90 | Fill #5 | Status: AC

## 2019-04-05 MED FILL — FAMOTIDINE 40 MG TABLET: ORAL | 30 days supply | Qty: 30 | Fill #1

## 2019-04-05 MED FILL — ONDANSETRON 8 MG DISINTEGRATING TABLET: 20 days supply | Qty: 60 | Fill #0 | Status: AC

## 2019-04-05 MED FILL — FAMOTIDINE 40 MG TABLET: 30 days supply | Qty: 30 | Fill #1 | Status: AC

## 2019-04-05 MED FILL — HYDROXYZINE HCL 50 MG TABLET: ORAL | 30 days supply | Qty: 90 | Fill #5

## 2019-04-05 NOTE — Unmapped (Signed)
Called [place] MEDICA[employer] - [employer] for auth for CPT [phone] and [id number] for [place] [facility]  Approved from 04/05/19 - 10/02/2019 - [id number]    Case ID number: [id number]    Information faxed to [facility] imaging

## 2019-04-08 ENCOUNTER — Other Ambulatory Visit: Payer: Self-pay | Admitting: Internal Medicine

## 2019-04-08 DIAGNOSIS — Z13820 Encounter for screening for osteoporosis: Secondary | ICD-10-CM

## 2019-04-08 DIAGNOSIS — Z7952 Long term (current) use of systemic steroids: Secondary | ICD-10-CM

## 2019-04-09 ENCOUNTER — Other Ambulatory Visit: Payer: Medicaid Other

## 2019-04-09 ENCOUNTER — Ambulatory Visit: Admit: 2019-04-09 | Discharge: 2019-04-10 | Payer: MEDICAID | Attending: Surgery | Primary: Surgery

## 2019-04-09 DIAGNOSIS — R0902 Hypoxemia: Secondary | ICD-10-CM

## 2019-04-09 DIAGNOSIS — M797 Fibromyalgia: Secondary | ICD-10-CM

## 2019-04-09 DIAGNOSIS — Z716 Tobacco abuse counseling: Secondary | ICD-10-CM

## 2019-04-09 DIAGNOSIS — K660 Peritoneal adhesions (postprocedural) (postinfection): Secondary | ICD-10-CM

## 2019-04-09 DIAGNOSIS — R400 Somnolence: Secondary | ICD-10-CM

## 2019-04-09 DIAGNOSIS — K50018 Crohn's disease of small intestine with other complication: Secondary | ICD-10-CM

## 2019-04-09 DIAGNOSIS — I10 Essential (primary) hypertension: Secondary | ICD-10-CM

## 2019-04-09 DIAGNOSIS — F17210 Nicotine dependence, cigarettes, uncomplicated: Secondary | ICD-10-CM

## 2019-04-09 DIAGNOSIS — R339 Retention of urine, unspecified: Secondary | ICD-10-CM

## 2019-04-09 DIAGNOSIS — F05 Delirium due to known physiological condition: Secondary | ICD-10-CM

## 2019-04-09 DIAGNOSIS — K219 Gastro-esophageal reflux disease without esophagitis: Secondary | ICD-10-CM

## 2019-04-09 DIAGNOSIS — F419 Anxiety disorder, unspecified: Secondary | ICD-10-CM

## 2019-04-09 DIAGNOSIS — J449 Chronic obstructive pulmonary disease, unspecified: Secondary | ICD-10-CM

## 2019-04-09 MED ORDER — NEOMYCIN 500 MG TABLET
ORAL_TABLET | Freq: Three times a day (TID) | ORAL | 0 refills | 1.00000 days | Status: CP
Start: 2019-04-09 — End: 2019-04-09

## 2019-04-09 MED ORDER — NEOMYCIN 500 MG TABLET: 1000 mg | tablet | Freq: Three times a day (TID) | 0 refills | 1 days | Status: AC

## 2019-04-09 MED ORDER — POLYETHYLENE GLYCOL 3350 17 GRAM ORAL POWDER PACKET: 238 g | packet | Freq: Once | 0 refills | 1 days | Status: AC

## 2019-04-09 MED ORDER — BISACODYL 5 MG TABLET,DELAYED RELEASE
ORAL_TABLET | Freq: Once | ORAL | 0 refills | 6.00000 days | Status: CP
Start: 2019-04-09 — End: 2019-04-22

## 2019-04-09 MED ORDER — BISACODYL 5 MG TABLET,DELAYED RELEASE: 20 mg | tablet | Freq: Once | 0 refills | 6 days | Status: AC

## 2019-04-09 MED ORDER — ERYTHROMYCIN 250 MG TABLET: 1000 mg | tablet | Freq: Three times a day (TID) | 0 refills | 1 days | Status: AC

## 2019-04-09 MED ORDER — ERYTHROMYCIN 250 MG TABLET
ORAL_TABLET | Freq: Three times a day (TID) | ORAL | 0 refills | 1.00000 days | Status: CP
Start: 2019-04-09 — End: 2019-04-09

## 2019-04-09 MED ORDER — POLYETHYLENE GLYCOL 3350 17 GRAM ORAL POWDER PACKET
PACK | Freq: Once | ORAL | 0 refills | 1.00000 days | Status: CP
Start: 2019-04-09 — End: 2019-04-09

## 2019-04-09 NOTE — Unmapped (Signed)
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     Plan  Book for ileocolectomy on 9/17  Consented in office today      Primary Care Physician: [facility]    CC:   Chief Complaint   Patient presents with   ??? Follow-up     Pt. stated that she is here for a follow up visit concerning crohn's and UC.       History of Present Illness: Mary Hawkins is a 38 y.o. female with a 10year history of Crohn's disease. She had a previous ileocecetomy and Meckel diverticulectomy and rectopexy in 2015. Her rectal symptoms have resolved. Her Crohn's symptoms have continued (diarrhea, bloating). She has not had obstructive symptoms. Medical therapy has not improved her symptoms. She had a colonoscopy in August with a severe stenosis of her anastomosis. Ct enterography with inflammation at anastomosis. She is a current smoker. She has lost 6lbs since April 2020. She is interested in surgical resection.           Medications:  Current Outpatient Medications on File Prior to Visit   Medication Sig Dispense Refill   ??? acetaminophen (TYLENOL) 500 MG tablet Take 1,000 mg by mouth every six (6) hours as needed for pain.     ??? cyanocobalamin 1,000 mcg/mL injection Inject 1 mL (1,000 mcg total) under the skin every thirty (30) days. 4 mL 2   ??? diphenoxylate-atropine (LOMOTIL) 2.5-0.025 mg per tablet Take 1 tablet by mouth Three (3) times a day as needed for diarrhea. 60 tablet 1   ??? DULoxetine (CYMBALTA) 30 MG capsule Take 3 capsules (90 mg total) by mouth daily. 90 capsule 11   ??? empty container Misc Use as directed 1 each PRN   ??? famotidine (PEPCID) 40 MG tablet Take 1/2 tablet (20 mg total) by mouth Two (2) times a day. 90 each 3   ??? gabapentin (NEURONTIN) 800 MG tablet Take 800 mg by mouth Three (3) times a day.     ??? hydroxyzine (ATARAX) 50 mg capsule/tablet Take 1 each (50 mg total) by mouth Three (3) times a day as needed. 90 tablet 5   ??? magnesium oxide (MAG-OX) 400 mg (241.3 mg magnesium) tablet Take 2 tablets (800 mg total) by mouth Two (2) times a day. 120 tablet 5   ??? ondansetron (ZOFRAN-ODT) 8 MG disintegrating tablet Dissolve 1 tablet (8 mg total) on the tongue every eight (8) hours as needed for nausea. 60 tablet 1   ??? pantoprazole (PROTONIX) 40 MG tablet Take 1 tablet (40 mg total) by mouth daily. 90 tablet 1   ??? potassium chloride (KLOR-CON) 20 MEQ CR tablet Take 1 tablet (20 mEq total) by mouth daily. 30 tablet 5   ??? syringe with needle (BD LUER-LOK SYRINGE) 3 mL 25 x 5/8 Syrg Use to inject under the skin every thirty (30) days. 25 each 0   ??? ustekinumab (STELARA) 90 mg/mL Syrg syringe Inject the contents of 1 syringe under the skin every 8 weeks 1 mL 3   ??? cholestyramine (QUESTRAN) 4 gram packet Take 1 packet (4 g total) by mouth two (2) times a day. Separate from other medications by 1-2 hours. (Patient not taking: Reported on 02/27/2019) 60 packet 5   ??? [EXPIRED] ciprofloxacin HCl (CIPRO) 500 MG tablet Take 1 tablet (  500 mg total) by mouth every twelve (12) hours for 14 days. 28 tablet 0   ??? cyclobenzaprine (FLEXERIL) 5 MG tablet Take 5 mg by mouth Four (4) times a day.     ??? ergocalciferol (DRISDOL) 50,000 unit capsule Take 1 capsule (50,000 Units total) by mouth once a week. 8 capsule 0   ??? ergocalciferol (VITAMIN D2) 1,250 mcg (50,000 unit) capsule TAKE 1 CAPSULE BY MOUTH ONCE A WEEK (Patient not taking: Reported on 03/20/2019) 4 capsule 0   ??? [EXPIRED] metroNIDAZOLE (FLAGYL) 500 MG tablet Take 1 tablet (500 mg total) by mouth two (2) times a day for 14 days. 28 tablet 0   ??? mirtazapine (REMERON) 30 MG tablet Take 1 tablet (30 mg total) by mouth nightly. (Patient not taking: Reported on 03/15/2019) 30 tablet 5   ??? potassium chloride SA (K-DUR,KLOR-CON) 20 MEQ tablet Take 1 tablet (20 mEq total) by mouth Two (2) times a day. (Patient not taking: Reported on 02/27/2019) 60 tablet 2   ??? [DISCONTINUED] syringe with needle, safety (BD SAFETY-LOK DETACHABLE NEEDL) 3 mL 25 gauge x 5/8 Syrg 1 each by Miscellaneous route every thirty (30) days. 25 Syringe 0     No current facility-administered medications on file prior to visit.            BP 141/100  - Pulse 115  - Temp 36.7 ??C (Oral)  - Ht 157.5 cm (5' 2)  - Wt 76.8 kg (169 lb 5 oz)  - BMI 30.97 kg/m??     Physical Exam:  General: well appearing, NAD  HEENT: NCAT, EOMI  Pulmonary: unlabored, not tachypneic  Cardiovascular: RRR  Abdomen: soft, mild tenderness in RLQ, nondistended, no masses/hernias  Neuro: AOx3, normal mood and affect      04/11/2019    I saw and evaluated the patient, participating in the all the portions of the service.  I reviewed the resident's note.  I agree with the resident's findings and plan.     Rae Halsted MD      ________________________________________

## 2019-04-09 NOTE — Unmapped (Signed)
Surgery scheduled for 04/25/2019. Consent obtained and witnessed

## 2019-04-09 NOTE — Unmapped (Signed)
Preop ERAS pathway protocol education completed. Carbohydrate drink provided

## 2019-04-09 NOTE — Unmapped (Signed)
Your surgery has been scheduled for 04/25/2019    COVID testing at [facility] through location on 04/23/2019     [facility] will call you the day before surgery with the exact time to arrive on the day of surgery, medication and diet instructions. Their telephone # is [phone].      If given Ensure, please drink it 1hour prior to your arrival to the hospital for surgery.     If given Bowel Prep instructions please follow them (yellow sheet of paper)     On the day of surgery you will check in to the Registration Office at the [facility] lobby.     If you have any questions or concerns regarding your appointment today please call   Everlene Balls, RN for Dr. Rae Halsted at [phone] or [phone]. Fax: [phone]   Clinic# [phone]

## 2019-04-09 NOTE — Unmapped (Signed)
A minimum of 60 minutes of integrated behavioral health services have been provided during this calendar month. See previous patient outreach encounter notes for details of services and durations.

## 2019-04-09 NOTE — Unmapped (Signed)
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     Plan  Book for ileocolectomy on 9/17  Consented in office today      Primary Care Physician: [facility]    CC:   Chief Complaint   Patient presents with   ??? Follow-up     Pt. stated that she is here for a follow up visit concerning crohn's and UC.       History of Present Illness: Mary Hawkins is a 38 y.o. female with a 10year history of Crohn's disease. She had a previous ileocecetomy and Meckel diverticulectomy and rectopexy in 2015. Her rectal symptoms have resolved. Her Crohn's symptoms have continued (diarrhea, bloating). She has not had obstructive symptoms. Medical therapy has not improved her symptoms. She had a colonoscopy in August with a severe stenosis of her anastomosis. Ct enterography with inflammation at anastomosis. She is a current smoker. She has lost 6lbs since April 2020. She is interested in surgical resection.           Medications:  Current Outpatient Medications on File Prior to Visit   Medication Sig Dispense Refill   ??? acetaminophen (TYLENOL) 500 MG tablet Take 1,000 mg by mouth every six (6) hours as needed for pain.     ??? cyanocobalamin 1,000 mcg/mL injection Inject 1 mL (1,000 mcg total) under the skin every thirty (30) days. 4 mL 2   ??? diphenoxylate-atropine (LOMOTIL) 2.5-0.025 mg per tablet Take 1 tablet by mouth Three (3) times a day as needed for diarrhea. 60 tablet 1   ??? DULoxetine (CYMBALTA) 30 MG capsule Take 3 capsules (90 mg total) by mouth daily. 90 capsule 11   ??? empty container Misc Use as directed 1 each PRN   ??? famotidine (PEPCID) 40 MG tablet Take 1/2 tablet (20 mg total) by mouth Two (2) times a day. 90 each 3   ??? gabapentin (NEURONTIN) 800 MG tablet Take 800 mg by mouth Three (3) times a day.     ??? hydroxyzine (ATARAX) 50 mg capsule/tablet Take 1 each (50 mg total) by mouth Three (3) times a day as needed. 90 tablet 5   ??? magnesium oxide (MAG-OX) 400 mg (241.3 mg magnesium) tablet Take 2 tablets (800 mg total) by mouth Two (2) times a day. 120 tablet 5   ??? ondansetron (ZOFRAN-ODT) 8 MG disintegrating tablet Dissolve 1 tablet (8 mg total) on the tongue every eight (8) hours as needed for nausea. 60 tablet 1   ??? pantoprazole (PROTONIX) 40 MG tablet Take 1 tablet (40 mg total) by mouth daily. 90 tablet 1   ??? potassium chloride (KLOR-CON) 20 MEQ CR tablet Take 1 tablet (20 mEq total) by mouth daily. 30 tablet 5   ??? syringe with needle (BD LUER-LOK SYRINGE) 3 mL 25 x 5/8 Syrg Use to inject under the skin every thirty (30) days. 25 each 0   ??? ustekinumab (STELARA) 90 mg/mL Syrg syringe Inject the contents of 1 syringe under the skin every 8 weeks 1 mL 3   ??? cholestyramine (QUESTRAN) 4 gram packet Take 1 packet (4 g total) by mouth two (2) times a day. Separate from other medications by 1-2 hours. (Patient not taking: Reported on 02/27/2019) 60 packet 5   ??? [EXPIRED] ciprofloxacin HCl (CIPRO) 500 MG tablet Take 1 tablet (  500 mg total) by mouth every twelve (12) hours for 14 days. 28 tablet 0   ??? cyclobenzaprine (FLEXERIL) 5 MG tablet Take 5 mg by mouth Four (4) times a day.     ??? ergocalciferol (DRISDOL) 50,000 unit capsule Take 1 capsule (50,000 Units total) by mouth once a week. 8 capsule 0   ??? ergocalciferol (VITAMIN D2) 1,250 mcg (50,000 unit) capsule TAKE 1 CAPSULE BY MOUTH ONCE A WEEK (Patient not taking: Reported on 03/20/2019) 4 capsule 0   ??? [EXPIRED] metroNIDAZOLE (FLAGYL) 500 MG tablet Take 1 tablet (500 mg total) by mouth two (2) times a day for 14 days. 28 tablet 0   ??? mirtazapine (REMERON) 30 MG tablet Take 1 tablet (30 mg total) by mouth nightly. (Patient not taking: Reported on 03/15/2019) 30 tablet 5   ??? potassium chloride SA (K-DUR,KLOR-CON) 20 MEQ tablet Take 1 tablet (20 mEq total) by mouth Two (2) times a day. (Patient not taking: Reported on 02/27/2019) 60 tablet 2   ??? [DISCONTINUED] syringe with needle, safety (BD SAFETY-LOK DETACHABLE NEEDL) 3 mL 25 gauge x 5/8 Syrg 1 each by Miscellaneous route every thirty (30) days. 25 Syringe 0     No current facility-administered medications on file prior to visit.            BP 141/100  - Pulse 115  - Temp 36.7 ??C (Oral)  - Ht 157.5 cm (5' 2)  - Wt 76.8 kg (169 lb 5 oz)  - BMI 30.97 kg/m??     Physical Exam:  General: well appearing, NAD  HEENT: NCAT, EOMI  Pulmonary: unlabored, not tachypneic  Cardiovascular: RRR  Abdomen: soft, mild tenderness in RLQ, nondistended, no masses/hernias  Neuro: AOx3, normal mood and affect      04/11/2019    I saw and evaluated the patient, participating in the all the portions of the service.  I reviewed the resident's note.  I agree with the resident's findings and plan.     Rae Halsted MD      ________________________________________

## 2019-04-10 ENCOUNTER — Ambulatory Visit
Admission: RE | Admit: 2019-04-10 | Discharge: 2019-04-10 | Disposition: A | Discharge status: Home / Self Care | Payer: Medicaid Other | Source: Ambulatory Visit | Attending: Internal Medicine | Admitting: Internal Medicine

## 2019-04-10 ENCOUNTER — Other Ambulatory Visit: Payer: Self-pay

## 2019-04-10 ENCOUNTER — Telehealth: Payer: Self-pay

## 2019-04-10 DIAGNOSIS — N949 Unspecified condition associated with female genital organs and menstrual cycle: Secondary | ICD-10-CM

## 2019-04-10 DIAGNOSIS — Z13820 Encounter for screening for osteoporosis: Secondary | ICD-10-CM

## 2019-04-10 DIAGNOSIS — Z7952 Long term (current) use of systemic steroids: Secondary | ICD-10-CM

## 2019-04-10 IMAGING — US US PELVIS COMPLETE WITH TRANSVAGINAL
1 series · 13 of 25 positions shown · non-contrast
Comparison: CT RIGHT hip [DATE]

CLINICAL DATA: Adnexal cyst on CT, history endometriosis, Crohn's
disease

EXAM:
TRANSABDOMINAL AND TRANSVAGINAL ULTRASOUND OF PELVIS
TECHNIQUE: Both transabdominal and transvaginal ultrasound examinations of the
pelvis were performed. Transabdominal technique was performed for
global imaging of the pelvis including uterus, ovaries, adnexal
regions, and pelvic cul-de-sac. It was necessary to proceed with
endovaginal exam following the transabdominal exam to visualize the
endometrium, and to characterize ovarian abnormalities.

[Series 1: us pelvis complete with transvaginal · 0.20mm/px · 101 acquisitions, 13 frames shown]
[im 1/101]
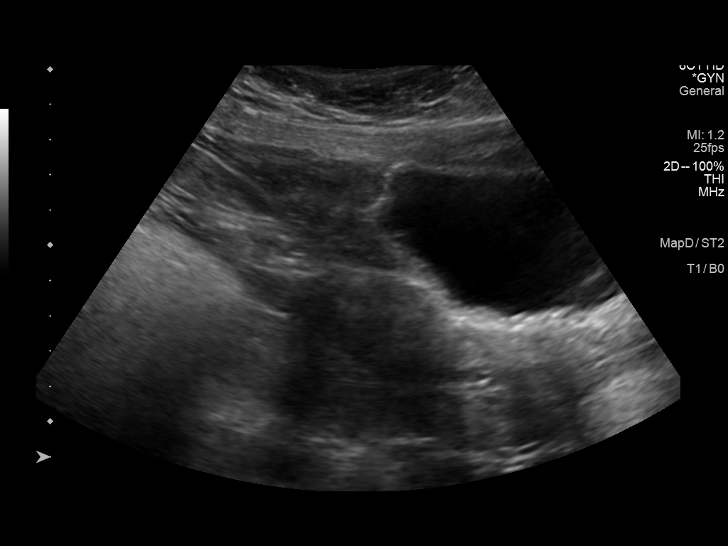
[im 9/101]
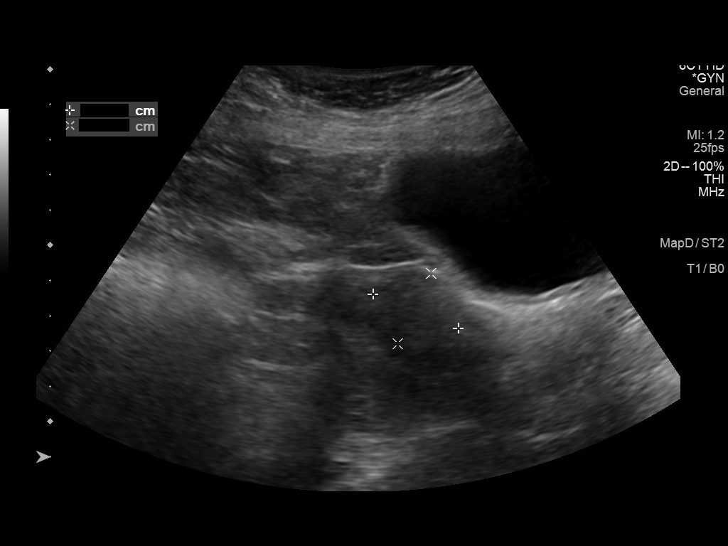
[im 17/101]
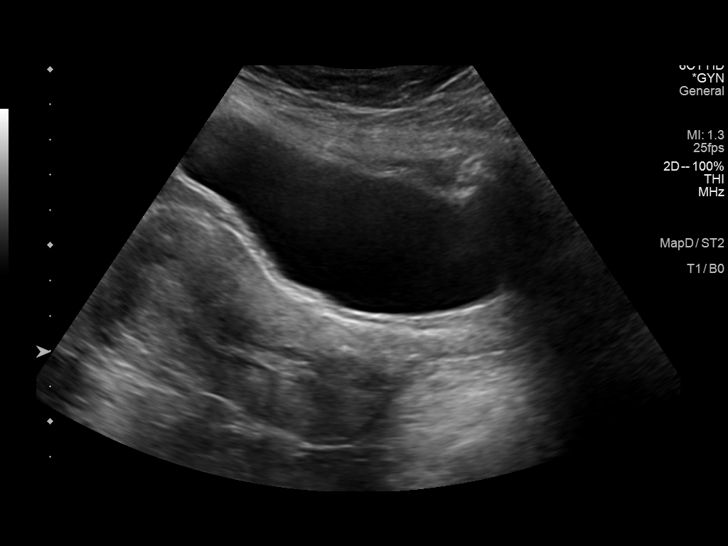
[im 26/101]
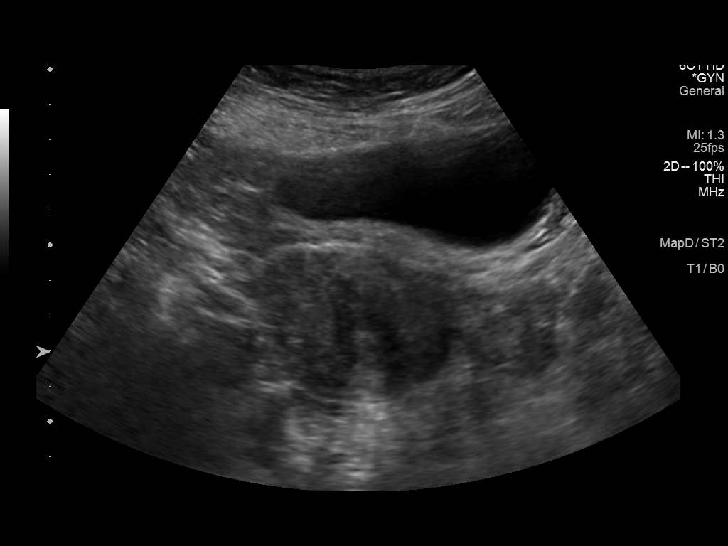
[im 34/101]
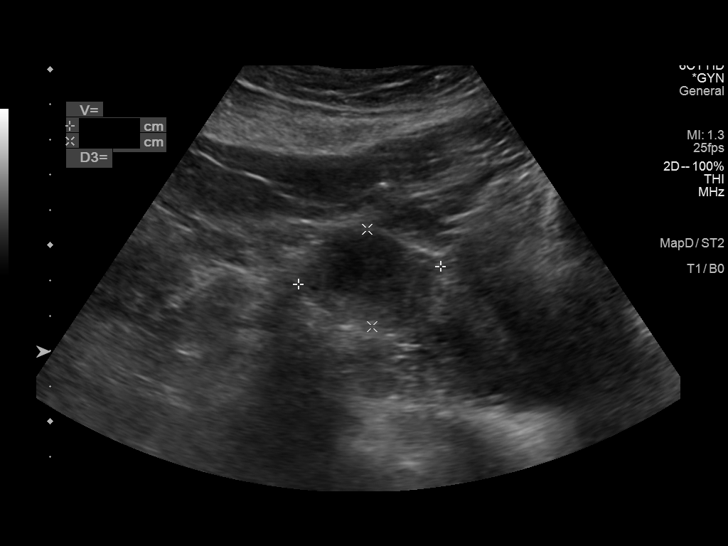
[im 42/101]
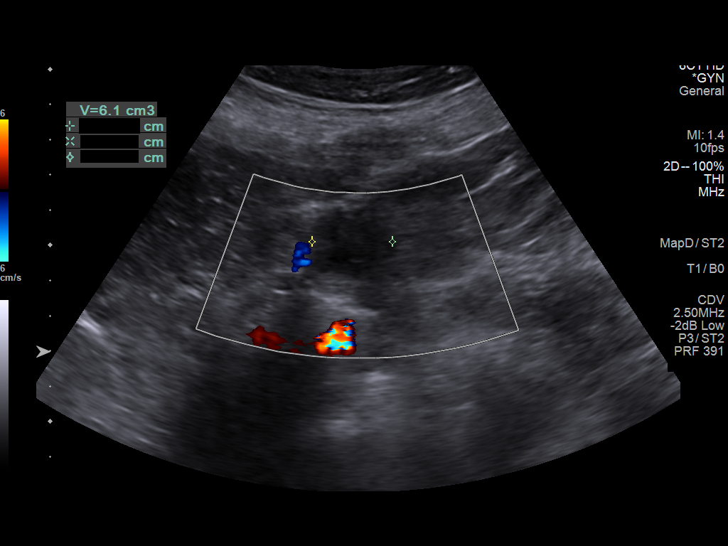
[im 51/101]
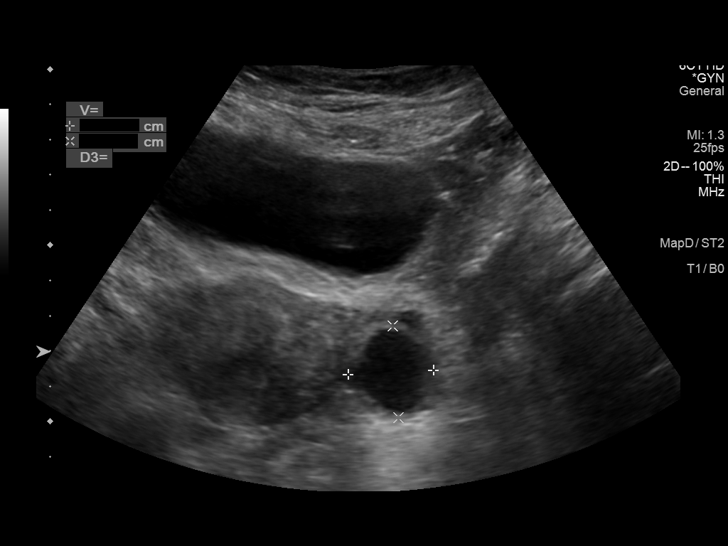
[im 59/101]
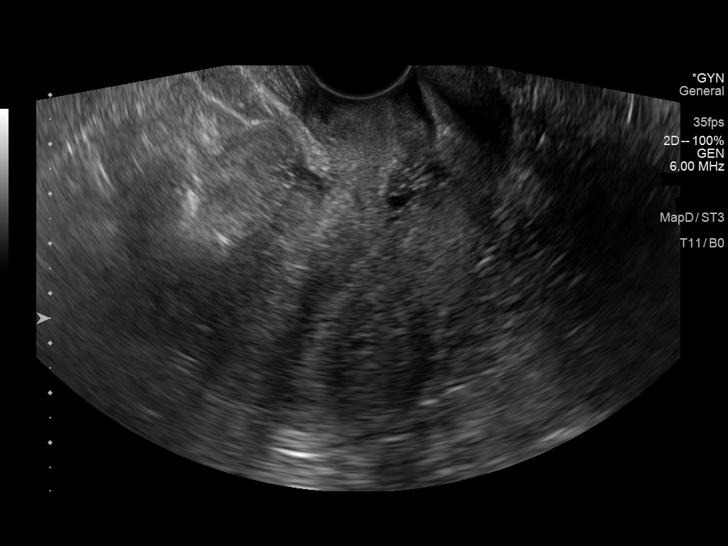
[im 67/101]
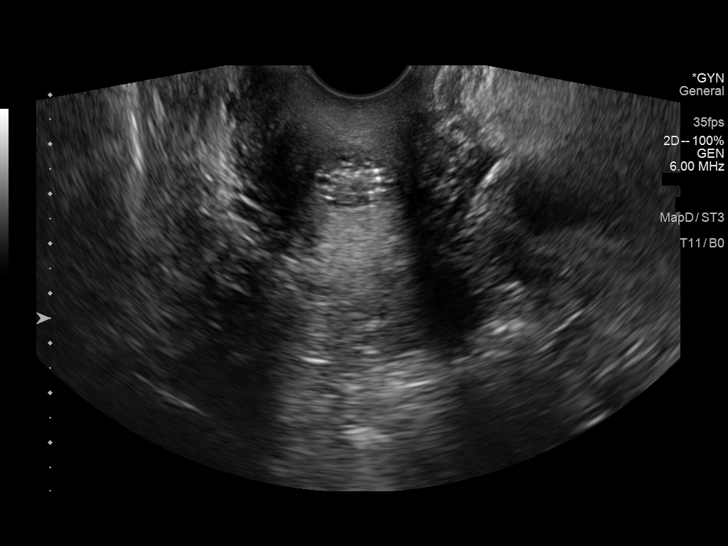
[im 76/101]
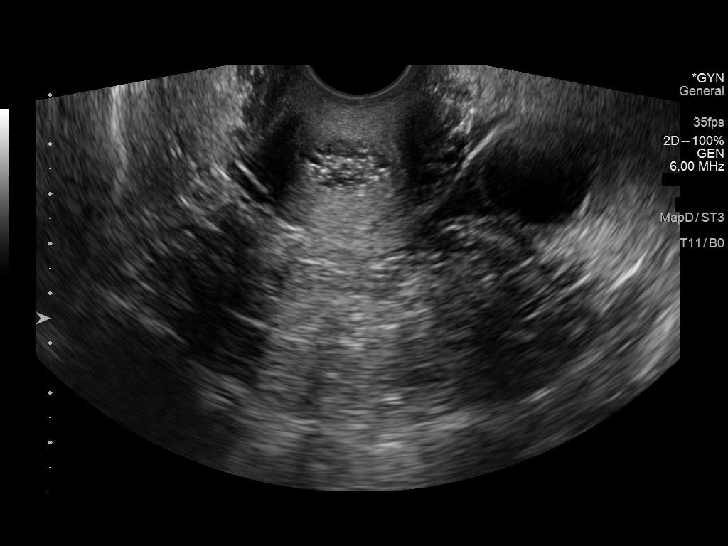
[im 84/101]
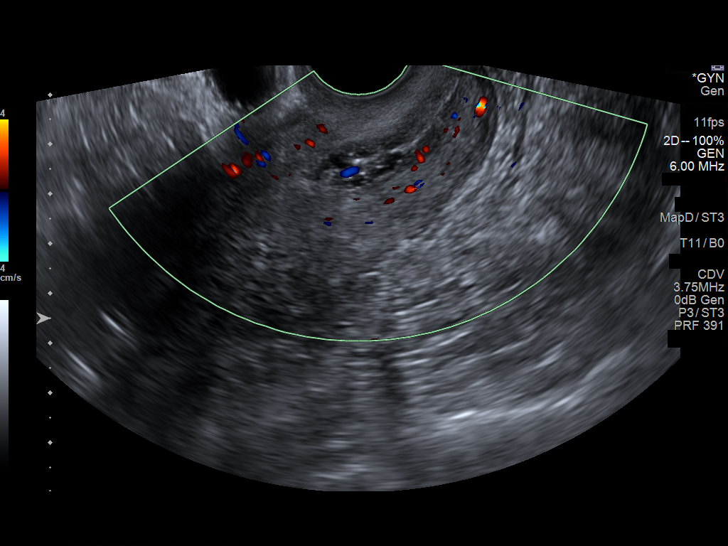
[im 92/101]
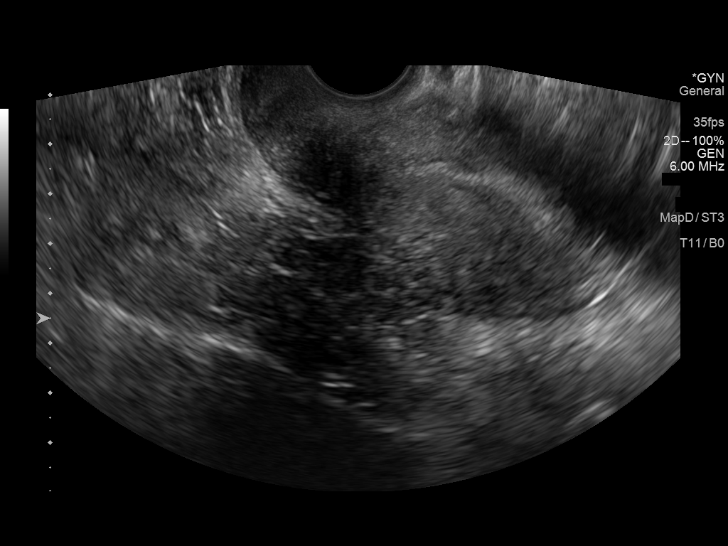
[im 101/101]
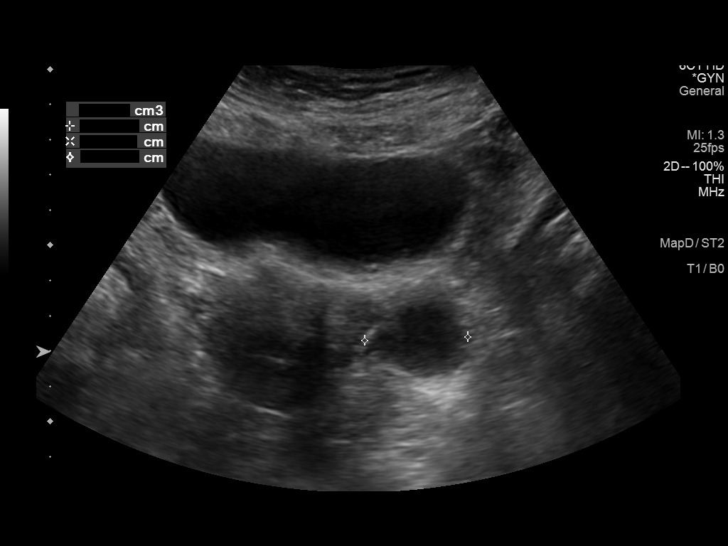

[13 of 25 positions shown; findings below may reference images not displayed]

FINDINGS: Uterus

Measurements: 8.7 x 4.8 x 5.5 cm = volume: 120 mL. Anteverted.
Normal morphology. Posterior wall subserosal leiomyoma 3.5 x 2.2 x
2.6 cm. No additional masses.

Endometrium

Thickness: 10 mm. Focal area of heterogeneous thickening of the
endometrium at the lower uterine segment measuring 1.6 x 1.1 x
cm, showing internal blood flow on color Doppler imaging, question
irregular polyp. No definite endometrial fluid

Right ovary

Measurements: 3.7 x 2.5 x 2.6 cm = volume: 12.1 mL. Normal
morphology without mass

Left ovary

Measurements: 4.2 x 3.0 x 3.2 cm = volume: 21.4 mL. Dominant
follicle 2.6 cm diameter. No additional mass.

Other findings

No free pelvic fluid or adnexal masses.
IMPRESSION: Probable subserosal leiomyoma at posterior uterus 3.5 cm greatest
size.

Probable endometrial polyp at lower uterine segment 1.6 x 1.1 x
cm in size; consider sonohysterogram for further evaluation, prior
to hysteroscopy or endometrial biopsy.

Large RIGHT adnexal cyst identified in the RIGHT ovary on prior the
CT exam has resolved.

## 2019-04-10 NOTE — Unmapped (Deleted)
[facility]  Psychiatry   New Patient Evaluation - Outpatient    Encounter Description: I spent *** minutes on the {phone audio video visit:67489} with the patient. I spent an additional *** minutes on pre- and post-visit activities.     The patient was physically located in [place] or a state in which I am permitted to provide care. The patient and/or parent/guardian understood that s/he may incur co-pays and cost sharing, and agreed to the telemedicine visit. The visit was reasonable and appropriate under the circumstances given the patient's presentation at the time.    The patient and/or parent/guardian has been advised of the potential risks and limitations of this mode of treatment (including, but not limited to, the absence of in-person examination) and has agreed to be treated using telemedicine. The patient's/patient's family's questions regarding telemedicine have been answered.     If the visit was completed in an ambulatory setting, the patient and/or parent/guardian has also been advised to contact their provider???s office for worsening conditions, and seek emergency medical treatment and/or call 911 if the patient deems either necessary.    Name: Mary Hawkins  Date: 04/09/2019  MRN: [id number]  DOB: Jun 21, 1981  [facility]: [facility]    Assessment:     Mary Hawkins is a 38 y.o., White or Caucasian race, Not Hispanic or Latino ethnicity,  ENGLISH speaking female  with a history of Crohn's Disease s/p ileocecal resection with severe stenosis of her anastomosis, mesh retropexy, chronic pain, recent onset seizure (March 2020, follows [facility]), history spontaneous hip fracture s/p ORIF in June 2020, depression and anxiety, who presents for evaluation of depression and anxiety.  Patient had previously been managed in the [facility] Management service.  She is currently on duloxetine 90 mg, hydroxyzine 50 mg TID prn (mainly used at night).  Prior trials include mirtazapine (lack of perceived benefit).    Risk Assessment:  A suicide and violence risk assessment was performed as part of this evaluation. There patient is deemed to be at chronic elevated risk for self-harm/suicide given the following factors: {PSY Suicide Risk Factors:22910}. There patient is deemed to be at chronic elevated risk for violence given the following factors: {PSY Violence Risk Factors:22914}. These risk factors are mitigated by the following factors:{PSY Mitigating Factors:22917}. There is no acute risk for suicide or violence at this time. The patient was educated about relevant modifiable risk factors including following recommendations for treatment of psychiatric illness and abstaining from substance abuse.   While future psychiatric events cannot be accurately predicted, the patient does not currently require  acute inpatient psychiatric care and does not currently meet [facility] involuntary commitment criteria.      Diagnoses:   Patient Active Problem List   Diagnosis   ??? Abdominal pain, generalized   ??? Rosacea   ??? Attention deficit hyperactivity disorder (ADHD)   ??? Allergic rhinitis   ??? Anxiety state   ??? B-complex deficiency   ??? Pain in joint, shoulder region   ??? Visual disturbances   ??? Brachial neuritis   ??? Backache   ??? Drug dependence, continuous abuse (CMS-HCC)   ??? Cervicalgia   ??? Migraine without aura   ??? Regional enteritis of small intestine (CMS-HCC)   ??? Dermatitis   ??? Endometriosis   ??? Esophageal reflux   ??? Headache   ??? Essential hypertension   ??? Thoracic or lumbosacral neuritis or radiculitis   ??? Pain in joint, multiple sites   ???  Neuralgia and neuritis   ??? Rectal prolapse   ??? Pain in thoracic spine   ??? Crohn's disease (CMS-HCC)   ??? AKI (acute kidney injury) (CMS-HCC)   ??? Leukocytosis   ??? Tobacco use disorder   ??? Major depressive disorder   ??? Erosive esophagitis   ??? Iron deficiency anemia   ??? Enrolled in [facility]al Health Care Management Program   ??? Crohn's disease of small intestine with other complication (CMS-HCC)       Stressors: ***    Disability Assessment Scale: ***     Plan:  # Depression  - Duloxetine 90 mg daily (last increased August 2020)  - Prior med trials: mirtazapine (no perceived benefit)    # Anxiety  - Duloxetine per above  - Hydroxyzine 50 mg TID prn    # Insomnia  - Hydroxyzine per above    # New Onset Seizure in March 2020  - Follows [facility], last seen 02/07/2019, being managed conservatively    # Restless leg syndrome  - previously on pramipexole, gabapentin managed by PCP    Revised Medication(s) Post Visit:  Outpatient Encounter Medications as of 04/16/2019   Medication Sig Dispense Refill   ??? acetaminophen (TYLENOL) 500 MG tablet Take 1,000 mg by mouth every six (6) hours as needed for pain.     ??? bisacodyL (DULCOLAX) 5 mg EC tablet Take 4 tablets (20 mg total) by mouth once for 1 dose. Take with two 8 oz glasses of clear liquid at 9:00 am the day prior to surgery. 25 tablet 0   ??? [EXPIRED] ciprofloxacin HCl (CIPRO) 500 MG tablet Take 1 tablet (500 mg total) by mouth every twelve (12) hours for 14 days. 28 tablet 0   ??? cyanocobalamin 1,000 mcg/mL injection Inject 1 mL (1,000 mcg total) under the skin every thirty (30) days. 4 mL 2   ??? diphenoxylate-atropine (LOMOTIL) 2.5-0.025 mg per tablet Take 1 tablet by mouth Three (3) times a day as needed for diarrhea. 60 tablet 1   ??? DULoxetine (CYMBALTA) 30 MG capsule Take 3 capsules (90 mg total) by mouth daily. 90 capsule 11   ??? empty container Misc Use as directed 1 each PRN   ??? ergocalciferol (DRISDOL) 50,000 unit capsule Take 1 capsule (50,000 Units total) by mouth once a week. 8 capsule 0   ??? erythromycin base (E-MYCIN) 250 MG tablet Take 4 tablets (1,000 mg total) by mouth 3 (three) times a day for 3 doses. Take at 2:00 pm, 3:00 pm, and 10:00 pm the day prior to surgery. 12 tablet 0   ??? famotidine (PEPCID) 40 MG tablet Take 1/2 tablet (20 mg total) by mouth Two (2) times a day. 90 each 3   ??? gabapentin (NEURONTIN) 800 MG tablet Take 800 mg by mouth Three (3) times a day.     ??? hydroxyzine (ATARAX) 50 mg capsule/tablet Take 1 each (50 mg total) by mouth Three (3) times a day as needed. 90 tablet 5   ??? magnesium oxide (MAG-OX) 400 mg (241.3 mg magnesium) tablet Take 2 tablets (800 mg total) by mouth Two (2) times a day. 120 tablet 5   ??? [EXPIRED] metroNIDAZOLE (FLAGYL) 500 MG tablet Take 1 tablet (500 mg total) by mouth two (2) times a day for 14 days. 28 tablet 0   ??? neomycin (MYCIFRADIN) 500 mg tablet Take 2 tablets (1,000 mg total) by mouth 3 (three) times a day for 3 doses. Take at 2:00 pm, 3:00 pm,  and 10:00 pm the day prior to surgery. 6 tablet 0   ??? ondansetron (ZOFRAN-ODT) 8 MG disintegrating tablet Dissolve 1 tablet (8 mg total) on the tongue every eight (8) hours as needed for nausea. 60 tablet 1   ??? pantoprazole (PROTONIX) 40 MG tablet Take 1 tablet (40 mg total) by mouth daily. 90 tablet 1   ??? polyethylene glycol (MIRALAX) 17 gram packet Mix contents of 14 packets (238 g total) in 64 ounces Gatorate. Drink 8 ounces of mixture every 15 minutes until gone starting 11am day prior to surgery. 14 packet 0   ??? potassium chloride (KLOR-CON) 20 MEQ CR tablet Take 1 tablet (20 mEq total) by mouth daily. 30 tablet 5   ??? syringe with needle (BD LUER-LOK SYRINGE) 3 mL 25 x 5/8 Syrg Use to inject under the skin every thirty (30) days. 25 each 0   ??? ustekinumab (STELARA) 90 mg/mL Syrg syringe Inject the contents of 1 syringe under the skin every 8 weeks 1 mL 3   ??? [DISCONTINUED] syringe with needle, safety (BD SAFETY-LOK DETACHABLE NEEDL) 3 mL 25 gauge x 5/8 Syrg 1 each by Miscellaneous route every thirty (30) days. 25 Syringe 0     No facility-administered encounter medications on file as of 04/16/2019.        Patient was given this writer's business card with confidential voicemail number as well as the [facility] Psychiatry urgent line number.  He/she was instructed to call 911 for emergencies.     Patient {PSY Plan of Care:23954} with the Attending MD, *** , {PSY Attending:23955}.     Roselie Skinner, MD    Subjective:    Psychiatric Chief Concern:  {Chief Complaint:210228460}    HPI: Patient is a 38 y.o., White or Caucasian race, Not Hispanic or Latino ethnicity,  ENGLISH speaking female  with a history of ***.***      PDMP: ***    Allergies:  {GSC Allergies:30421601}    Medications:   {Sur Gen Med list:[id number]}    Psychiatric/Medical History:  {GSC Past Medical History:30421616}    Surgical History:  {GSC Past Surgical History:30421619}    Social History:  Social History     Socioeconomic History   ??? Marital status: Single     Spouse name: Not on file   ??? Number of children: Not on file   ??? Years of education: Not on file   ??? Highest education level: Not on file   Occupational History   ??? Not on file   Social Needs   ??? Financial resource strain: Not on file   ??? Food insecurity     Worry: Not on file     Inability: Not on file   ??? Transportation needs     Medical: Not on file     Non-medical: Not on file   Tobacco Use   ??? Smoking status: Current Every Day Smoker     Packs/day: 0.50     Years: 15.00     Pack years: 7.50     Types: Cigarettes   ??? Smokeless tobacco: Never Used   ??? Tobacco comment: stopped in 2019 restarted back   Substance and Sexual Activity   ??? Alcohol use: Yes     Frequency: 2-4 times a month     Drinks per session: 1 or 2     Comment: socially   ??? Drug use: No   ??? Sexual activity: Not on file   Lifestyle   ??? Physical activity  Days per week: Not on file     Minutes per session: Not on file   ??? Stress: Not on file   Relationships   ??? Social Wellsite geologist on phone: Not on file     Gets together: Not on file     Attends religious service: Not on file     Active member of club or organization: Not on file     Attends meetings of clubs or organizations: Not on file     Relationship status: Not on file   Other Topics Concern   ??? Not on file   Social History Narrative    Currently unemployed.  She is a 94-year-old son which takes care of.       Family History:  {GSC Family History:30421602}    ROS: ***    Objective:     Vitals:   There were no vitals filed for this visit.    Mental Status Exam:  Appearance:    {PSY Appearance:23008}   Motor:   {PSY Motor:23010}   Speech/Language:    {PSY Speech/Lang:23011}   Mood:   {PSY Mood:23012}   Affect:   {PSY Affect:23013}   Thought process:   {PSY Thought Process:23015}   Thought content:     {PSY Thought Content:23016}   Perceptual disturbances:     {PSY Perceptual Disturbances:23017}     Orientation:   {PSY Orientation:23018}   Attention:   {PSY Attention:23019}   Concentration:   {PSY Concentration:23020}   Memory:   {PSY Memory:23021}    Fund of knowledge:    {PSY Fund of Knowledge:23022}   Insight:     {PSY Insight:23023}   Judgment:    {PSY Judgment:23024}   Impulse Control:   {PSY Impulse Control:23555}     PE: ***    Test Results:  Data Review: {PSY Labs:23633}  Imaging: Radiology report(s) reviewed.   Brain MRI W WO Contrast 03/11/2019 per Care Everywhere    Result Impression     Normal MRI brain (with and without).          INTERPRETING PHYSICIAN:   Suanne Marker, MD  Certified in Neurology, Neurophysiology and Neuroimaging    [facility]  [address], [address]  [place], [place] [address]  [phone]         Psychometrics:    PHQ-9 PHQ-9 TOTAL SCORE   03/11/2019 14   01/08/2019 0   12/06/2018 18   10/10/2018 17   08/14/2018 13      GAD7 Total Score GAD-7 Total Score   03/11/2019 12   12/06/2018 21   10/10/2018 21   08/14/2018 11

## 2019-04-10 NOTE — Telephone Encounter (Signed)
Medical procedure form completed and singed by Dr. Jannifer Franklin.  Form was faxed back along with most recent office note to [facility]. Fax # [phone], confirmation was received.

## 2019-04-16 DIAGNOSIS — F411 Generalized anxiety disorder: Secondary | ICD-10-CM

## 2019-04-16 DIAGNOSIS — F339 Major depressive disorder, recurrent, unspecified: Secondary | ICD-10-CM

## 2019-04-16 NOTE — Unmapped (Signed)
Attempted to contact pt for initial appointment at both home phone and mobile phone.  Voice mailbox not set up, unable to leave message.

## 2019-04-16 NOTE — Unmapped (Signed)
Behavioral Health Care Management            Date of Service:  04/16/2019        Date of Last Encounter: 04/02/2019    Purpose of contact:     [facility] Social Worker contacted the patient as part of the [facility]- attempted to contact pt at both their mobile and home #, unable to lvm at either. Attempted to contact to remind them of their upcoming appt with [facility] Psych[facility] today at 1 pm.    Follow Up Plan/Next Steps:    []  Schedule follow up appointment with GI Physician  []  Provide referral/linkage information:  [x]  Consult with GI Physician, re: care plan and patient's treatment progress  []  Other:      Additional Information/Plan:  Pt was provided with my direct office phone number should additional needs arise.    Future Appointments   Date Time Provider Department Center   04/16/2019  1:15 PM Roselie Skinner, MD PSYADTC [facility]   04/23/2019 11:45 AM [facility] COVID PRE TEST ACC PROVIDER RESPDIAGCACC [facility]   04/25/2019  1:40 PM Glean Salen, MD OBWPHCLINC [facility]       Ottie Glazier, MSW, M.Ed, LCSWA  Social Worker- MSW  [facility]  [phone]

## 2019-04-18 NOTE — Unmapped (Unsigned)
09/10-pt requested a call back in few weeks,due to her having surgery-she states they may take her off Stelara -CB

## 2019-04-19 DIAGNOSIS — F411 Generalized anxiety disorder: Secondary | ICD-10-CM

## 2019-04-19 NOTE — Unmapped (Signed)
Behavioral Health Collaborative Care Management Psychiatric Consultation    Consultation with [facility] Manager in regards to patient's behavioral health needs. Reviewed [facility] care plan and discussed patient treatment progress.    38 year old with a history of Crohn's disease,??chronic pain, depression and anxiety.????Current medication regiment: Cymbalta 60 mg po daily, Remeron 30 mg nightly, Hydroxyzine 50 mg PRN.   ??  Interval hx per CM:??Missed her appointment to establish with psychiatry.  Upcoming surgery -ileocolectomy on 9/17    Revision to care plan recommended as follows:   - Discussed with CM reaching out to patient and prompting her to call the psychiatry clinic to reschedule her appointment  - Would check in with patient on what regimen she is currently taking (did she increase Cymbalta to 90 mg daily?, is she off the Remeron? How often is she taking the hydroxyzine?)     This case review was conducted with information gathered from chart and from care manager information.  I have not examined the patient directly.  Physician should incorporate recommendations as appropriate given full current clinical picture.    Recommendations will be sent to GI provider Dr. [name] and RN patient coordinator??Neta Mends.  ??  Dr. Lonell Face was present during the case consultation review and is in agreement with above recommendations and plan.      Karie Chimera, MD  04/19/2019

## 2019-04-22 DIAGNOSIS — K50018 Crohn's disease of small intestine with other complication: Secondary | ICD-10-CM

## 2019-04-22 MED ORDER — ERYTHROMYCIN 250 MG TABLET
ORAL_TABLET | Freq: Three times a day (TID) | ORAL | 0 refills | 1 days | Status: SS
Start: 2019-04-22 — End: 2019-04-29

## 2019-04-22 MED ORDER — POLYETHYLENE GLYCOL 3350 17 GRAM ORAL POWDER PACKET
PACK | Freq: Once | ORAL | 0 refills | 1 days | Status: SS
Start: 2019-04-22 — End: 2019-04-29

## 2019-04-22 MED ORDER — BISACODYL 5 MG TABLET,DELAYED RELEASE
ORAL_TABLET | Freq: Once | ORAL | 0 refills | 6 days | Status: SS
Start: 2019-04-22 — End: 2019-04-29

## 2019-04-22 MED ORDER — NEOMYCIN 500 MG TABLET
ORAL_TABLET | Freq: Three times a day (TID) | ORAL | 0 refills | 1 days | Status: SS
Start: 2019-04-22 — End: 2019-04-29

## 2019-04-23 ENCOUNTER — Ambulatory Visit: Admit: 2019-04-23 | Discharge: 2019-04-24 | Payer: MEDICAID | Attending: Family | Primary: Family

## 2019-04-23 DIAGNOSIS — Z01812 Encounter for preprocedural laboratory examination: Secondary | ICD-10-CM

## 2019-04-23 DIAGNOSIS — Z1159 Encounter for screening for other viral diseases: Secondary | ICD-10-CM

## 2019-04-23 DIAGNOSIS — Z20828 Contact with and (suspected) exposure to other viral communicable diseases: Secondary | ICD-10-CM

## 2019-04-23 NOTE — Unmapped (Signed)
COVID Pre-Procedure Intake Form     Assessment     Mary Hawkins is a 38 y.o. female presenting to [facility] for COVID testing.     Plan     If no testing performed, pt counseled on routine care for respiratory illness.  If testing performed, COVID sent.  Patient directed to Home given findings during today's visit.    Subjective     Mary Hawkins is a 38 y.o. female who [facility] with complaints of the following:    Exposure History: In the last 21 days?     Have you traveled outside of [place]? No               Have you been in close contact with someone confirmed by a test to have COVID? (Close contact is within 6 feet for at least 10 minutes) No       Have you worked in a health care facility? No     Lived or worked facility like a nursing home, group home, or assisted living?    No         Are you scheduled to have surgery or a procedure in the next 3 days? Yes               Are you scheduled to receive cancer chemotherapy within the next 7 days?    No     Have you ever been tested before for COVID-19 with a swab of your nose? Yes: When: nt stated, Where:  nt stated   Are you a healthcare worker being tested so to return to work No     Right now,  do you have any of the following that developed over the past 7 days (as stated by patient on intake form):    Subjective fever (felt feverish) No   Chills (especially repeated shaking chills) No   Severe fatigue (felt very tired) No   Muscle aches No   Runny nose No   Sore throat No   Loss of taste or smell No   Cough (new onset or worsening of chronic cough) No   Shortness of breath No   Nausea or vomiting No   Headache No   Abdominal Pain No   Diarrhea (3 or more loose stools in last 24 hours) No       Scribe's Attestation: Paulita Fujita, FNP obtained and performed the history, physical exam and medical decision making elements that were entered into the chart.  Signed by Alleen Borne serving as Scribe, on 04/23/2019 12:27 PM      The documentation recorded by the scribe accurately reflects the service I personally performed and the decisions made by me. Aida Puffer, FNP  April 25, 2019 6:18 PM

## 2019-04-24 DIAGNOSIS — R339 Retention of urine, unspecified: Secondary | ICD-10-CM

## 2019-04-24 DIAGNOSIS — F05 Delirium due to known physiological condition: Secondary | ICD-10-CM

## 2019-04-24 DIAGNOSIS — K660 Peritoneal adhesions (postprocedural) (postinfection): Secondary | ICD-10-CM

## 2019-04-24 DIAGNOSIS — F419 Anxiety disorder, unspecified: Secondary | ICD-10-CM

## 2019-04-24 DIAGNOSIS — I10 Essential (primary) hypertension: Secondary | ICD-10-CM

## 2019-04-24 DIAGNOSIS — M797 Fibromyalgia: Secondary | ICD-10-CM

## 2019-04-24 DIAGNOSIS — R0902 Hypoxemia: Secondary | ICD-10-CM

## 2019-04-24 DIAGNOSIS — J449 Chronic obstructive pulmonary disease, unspecified: Secondary | ICD-10-CM

## 2019-04-24 DIAGNOSIS — Z716 Tobacco abuse counseling: Secondary | ICD-10-CM

## 2019-04-24 DIAGNOSIS — K50018 Crohn's disease of small intestine with other complication: Secondary | ICD-10-CM

## 2019-04-24 DIAGNOSIS — K219 Gastro-esophageal reflux disease without esophagitis: Secondary | ICD-10-CM

## 2019-04-24 DIAGNOSIS — R400 Somnolence: Secondary | ICD-10-CM

## 2019-04-24 DIAGNOSIS — F17210 Nicotine dependence, cigarettes, uncomplicated: Secondary | ICD-10-CM

## 2019-04-25 ENCOUNTER — Ambulatory Visit
Admit: 2019-04-25 | Discharge: 2019-04-29 | Disposition: A | Discharge status: Home / Self Care | Payer: MEDICAID | Admitting: Surgery

## 2019-04-25 ENCOUNTER — Encounter
Admit: 2019-04-25 | Discharge: 2019-04-29 | Disposition: A | Discharge status: Home / Self Care | Payer: MEDICAID | Admitting: Surgery

## 2019-04-25 DIAGNOSIS — R0902 Hypoxemia: Secondary | ICD-10-CM

## 2019-04-25 DIAGNOSIS — F17210 Nicotine dependence, cigarettes, uncomplicated: Secondary | ICD-10-CM

## 2019-04-25 DIAGNOSIS — R400 Somnolence: Secondary | ICD-10-CM

## 2019-04-25 DIAGNOSIS — K219 Gastro-esophageal reflux disease without esophagitis: Secondary | ICD-10-CM

## 2019-04-25 DIAGNOSIS — F05 Delirium due to known physiological condition: Secondary | ICD-10-CM

## 2019-04-25 DIAGNOSIS — R339 Retention of urine, unspecified: Secondary | ICD-10-CM

## 2019-04-25 DIAGNOSIS — I10 Essential (primary) hypertension: Secondary | ICD-10-CM

## 2019-04-25 DIAGNOSIS — F419 Anxiety disorder, unspecified: Secondary | ICD-10-CM

## 2019-04-25 DIAGNOSIS — M797 Fibromyalgia: Secondary | ICD-10-CM

## 2019-04-25 DIAGNOSIS — K660 Peritoneal adhesions (postprocedural) (postinfection): Secondary | ICD-10-CM

## 2019-04-25 DIAGNOSIS — K50018 Crohn's disease of small intestine with other complication: Secondary | ICD-10-CM

## 2019-04-25 DIAGNOSIS — Z716 Tobacco abuse counseling: Secondary | ICD-10-CM

## 2019-04-25 DIAGNOSIS — J449 Chronic obstructive pulmonary disease, unspecified: Secondary | ICD-10-CM

## 2019-04-25 HISTORY — PX: PARTIAL COLECTOMY: SHX5273

## 2019-04-25 NOTE — Unmapped (Addendum)
Mary Hawkins underwent colectomy, Partial, With Removal Of Terminal Ileum With Ileocolostomy on 04/25/2019. She tolerated the procedure well, was extubated in the OR, and was taken to the PACU and received routine postoperative care before being transferred to the floor. She required replacement of foley catheter for retention on POD 2. Rapid response was called on POD 2 for increasing O2 requirement and somnolence. Mental status improved with narcan. She was transferred to ISCU for closer monitoring and O2 was weaned. She was made floor status on POD 4. Foley was removed again on POD 4 and she was Hawkins to void adequately.  Her diet was slowly advanced and at the time of discharge, she was tolerating a regular diet. Pain medications were transitioned to oral without difficulty.  She is being discharged in stable condition on 04/29/2019.

## 2019-04-26 LAB — BASIC METABOLIC PANEL
BLOOD UREA NITROGEN: 13 mg/dL (ref 7–21)
BUN / CREAT RATIO: 13
CALCIUM: 7.9 mg/dL — ABNORMAL LOW (ref 8.5–10.2)
CHLORIDE: 109 mmol/L — ABNORMAL HIGH (ref 98–107)
CO2: 15 mmol/L — ABNORMAL LOW (ref 22.0–30.0)
CREATININE: 1.04 mg/dL — ABNORMAL HIGH (ref 0.60–1.00)
EGFR CKD-EPI AA FEMALE: 79 mL/min/{1.73_m2} (ref >=60)
EGFR CKD-EPI NON-AA FEMALE: 69 mL/min/{1.73_m2} (ref >=60)
GLUCOSE RANDOM: 131 mg/dL (ref 70–179)
POTASSIUM: 3.6 mmol/L (ref 3.5–5.0)
SODIUM: 136 mmol/L (ref 135–145)

## 2019-04-26 LAB — MEAN CORPUSCULAR VOLUME: Lab: 87.8

## 2019-04-26 LAB — PHOSPHORUS
PHOSPHORUS: 3.7 mg/dL (ref 2.9–4.7)
Phosphate:MCnc:Pt:Ser/Plas:Qn:: 3.7

## 2019-04-26 LAB — CBC
HEMOGLOBIN: 12.4 g/dL (ref 12.0–16.0)
MEAN CORPUSCULAR HEMOGLOBIN CONC: 31.9 g/dL (ref 31.0–37.0)
MEAN CORPUSCULAR HEMOGLOBIN: 28 pg (ref 26.0–34.0)
MEAN CORPUSCULAR VOLUME: 87.8 fL (ref 80.0–100.0)
MEAN PLATELET VOLUME: 7.2 fL (ref 7.0–10.0)
PLATELET COUNT: 434 10*9/L (ref 150–440)
RED CELL DISTRIBUTION WIDTH: 16.3 % — ABNORMAL HIGH (ref 12.0–15.0)
WBC ADJUSTED: 26.7 10*9/L — ABNORMAL HIGH (ref 4.5–11.0)

## 2019-04-26 LAB — GLUCOSE RANDOM: Glucose:MCnc:Pt:Ser/Plas:Qn:: 131

## 2019-04-26 LAB — MAGNESIUM: Magnesium:MCnc:Pt:Ser/Plas:Qn:: 1 — ABNORMAL LOW

## 2019-04-26 NOTE — Unmapped (Signed)
Daily Progress Note    Today's Date: 04/26/2019  Admission date: 04/25/2019  Length of stay: 1 days  Hospital Service: [name])  Attending: [name], MD    Assessment:    Principal Problem:    Crohn's disease of small intestine with other complication (CMS-HCC)      Mary Hawkins is a 38 y.o. female, s/p Colectomy, Partial, With Removal Of Terminal Ileum With Ileocolostomy on 9/17.    1 Day Post-Op    Interval History    Reports uncontrolled pain this am, will continue pca today. Stool x 2 overnight (liquid only). Moderately bloated this am. Mesentery friable, heparin held post op, will restart today.     Plan:    -multimodal pain meds, continue pca   -replete mag, K  -regular diet  -none/medlocked   -continue strip vac until POD 3  --continue alvimopan until return of bowel function--loose stools over night likely residual colonic contents  -d/c foley, monitor for void  -prophylaxis: subcut heparin 5,000 units tid and SCDs    Objective:    Vital signs in last 24 hours:  Temp:  [36.1 ??C (97 ??F)-37.1 ??C (98.8 ??F)] 37.1 ??C (98.8 ??F)  Heart Rate:  [89-107] 92  SpO2 Pulse:  [93-104] 103  Resp:  [11-17] 16  BP: (101-143)/(69-100) 118/69  MAP (mmHg):  [78-97] 78  FiO2 (%):  [2 %] 2 %  SpO2:  [94 %-99 %] 94 %  BMI (Calculated):  [33.86] 33.86    Intake/Output this shift:  I/O this shift:  In: -   Out: 100 [Stool:100]    Physical Exam:  Gen:  alert, well appearing, in no acute distress  CV:  regular rate, normotensive  Pulm:  breathing comfortably on room air  Abd:  soft and appropriately tender to palpation, bloated. Surgical incision with strip vac, adequate seal.  Ext:  warm and well perfused bilaterally, no edema        WBC       Lab Results   Component Value Date    WBC 26.7 (H) 04/26/2019    HGB 12.4 04/26/2019    HCT 38.7 04/26/2019    PLT 434 04/26/2019       Lab Results   Component Value Date    NA 136 04/26/2019    K 3.6 04/26/2019    CL 109 (H) 04/26/2019    CO2 15.0 (L) 04/26/2019    BUN 13 04/26/2019    CREATININE 1.04 (H) 04/26/2019    CALCIUM 7.9 (L) 04/26/2019    MG 1.0 (L) 04/26/2019    PHOS 3.7 04/26/2019       Lab Results   Component Value Date    PT 10.1 (L) 09/09/2018    INR 0.88 09/09/2018    APTT 30.1 09/09/2018       Lab Results   Component Value Date    ALKPHOS 108 11/20/2018    BILITOT 0.2 11/20/2018    BILIDIR 0.2 02/18/2014    PROT 6.5 11/20/2018    ALBUMIN 3.6 11/20/2018    ALT 20 11/20/2018    AST 20 11/20/2018       Lab Results   Component Value Date    ALBUMIN 3.6 11/20/2018    TTR 32 11/12/2013

## 2019-04-26 NOTE — Unmapped (Signed)
Care Management  Initial Transition Planning Assessment              General CM met with patient in pt room.  Pt/visitors was not wearing hospital provided masks for the duration of the interaction with CM.   CM was wearing hospital provided surgical mask and hospital provided eye protection.  CM was within 6 foot of the patient/visitors during this interaction.   Care Manager assessed the patient by : In person interview with patient, Medical record review, Discussion with Clinical Care team  Pt underwent an ileocectomy.   Orientation Level: Oriented X4  Reason for referral: Discharge Planning    Contact/Decision Maker  Extended Emergency Contact Information  Primary Emergency Contact: Penelope Coop [place]  Mobile Phone: [phone]  Relation: Mother  Secondary Emergency Contact: Merrie Roof  Mobile Phone: [phone]  Relation: Friend    Legal Next of Kin / Guardian / POA / Advance Directives     HCDM (patient stated preference): Squyres,Stacey - Mother - [phone]    Advance Directive (Medical Treatment)  Does patient have an advance directive covering medical treatment?: Patient does not have advance directive covering medical treatment.  Reason patient does not have an advance directive covering medical treatment:: Patient does not wish to complete one at this time.    Health Care Decision Maker [HCDM] (Medical & Mental Health Treatment)  Healthcare Decision Maker: Patient does not wish to appoint a Health Care Decision Maker at this time  Information offered on HCDM, Medical & Mental Health advance directives:: Patient declined information.    Advance Directive (Mental Health Treatment)  Does patient have an advance directive covering mental health treatment?: Patient does not have advance directive covering mental health treatment.  Reason patient does not have an advance directive covering mental health treatment:: Patient does not wish to complete one at this time. Patient Information  Lives with: Children, Parent mother and 96 year old son  Type of Residence: Mailing Address:  [address] [place] [address]  Contacts: Accompanied by: Alone  Patient Phone Number:         Medical Provider(s): [facility]  Reason for Admission: Admitting Diagnosis:  Crohn's disease of small intestine with other complication (CMS-HCC) [K50.018]  Past Medical History:   has a past medical history of Anxiety, Arthritis, Back pain, Crohn disease (CMS-HCC), Eczema, Endometriosis, Fibromyalgia, GERD (gastroesophageal reflux disease), Headache(784.0), Hypertension, and Ulcerative colitis (CMS-HCC).  Past Surgical History:   has a past surgical history that includes Rectal prolapse repair; pr upper gi endoscopy,biopsy (N/A, 11/12/2013); pr colonoscopy flx dx w/collj spec when pfrmd (N/A, 11/12/2013); pr remvl colon & term ileum w/ileocolostomy (N/A, 11/13/2013); Breast reconstruction (2004); Endometrial ablation; pr colonoscopy flx dx w/collj spec when pfrmd (N/A, 02/20/2014); pr upper gi endoscopy,biopsy (N/A, 12/27/2017); pr colonoscopy flx dx w/collj spec when pfrmd (N/A, 12/27/2017); Hip surgery; pr colonoscopy flx dx w/collj spec when pfrmd (N/A, 03/15/2019); pr upper gi endoscopy,biopsy (N/A, 03/15/2019); and pr remvl colon & term ileum w/ileocolostomy (N/A, 04/25/2019).   Previous admit date: 12/23/2017    Primary Insurance- Payor: MEDICAID [place] / Plan: MEDICAID [place] ACCESS / Product Type: *No Product type* /   Secondary Insurance ??? None  Prescription Coverage ???   Preferred Pharmacy - [facility]  [id number] [place], [place] - [address]  [facility]    Transportation home: Private vehicle  Level of function prior to admission: Independent          Type of Residence: Private residence        Location/Detail: [employer] [place])    Support Systems: Family Members Responsibilities/Dependents at home?: Yes (Describe)    Home Care services in place prior to admission?: No                  Equipment Currently Used at Home: commode, shower chair, walker, rolling       Currently receiving outpatient dialysis?: No       Financial Information       Need for financial assistance?: No       Social Determinants of Health  Social Determinants of Health were addressed in provider documentation.  Please refer to patient history.    Discharge Needs Assessment  Concerns to be Addressed:      Clinical Risk Factors:      Barriers to taking medications: No    Prior overnight hospital stay or ED visit in last 90 days: No    Readmission Within the Last 30 Days: no previous admission in last 30 days              Equipment Needed After Discharge: none    Discharge Facility/Level of Care Needs:      Readmission  Risk of Unplanned Readmission Score: UNPLANNED READMISSION SCORE: 11%  Predictive Model Details           11% (Low) Factors Contributing to Score   Calculated 04/26/2019 11:46 21% Number of active Rx orders is 32   [place] Risk of Unplanned Readmission Model 20% Diagnosis of drug abuse is present     19% Active NSAID Rx order is present     9% Latest calcium is low (7.9 mg/dL)     6% Imaging order is present in last 6 months     6% Phosphorous result is present     4% Diagnosis of deficiency anemia is present     4% Active anticoagulant Rx order is present     Readmitted Within the Last 30 Days? (No if blank)   Patient at risk for readmission?: No    Discharge Plan  Screen findings are: Care Manager reviewed the plan of the patient's care with the Multidisciplinary Team. No discharge planning needs identified at this time. Care Manager will continue to manage plan and monitor patient's progress with the team.    Expected Discharge Date: 04/30/2019    Expected Transfer from Critical Care:              Initial Assessment complete?: Yes

## 2019-04-26 NOTE — Unmapped (Signed)
Operative Note  (CSN: [id number])      Date of Surgery: 04/25/2019    Pre-op Diagnosis: crohn's disease    Post-op Diagnosis: same     Colectomy, Partial, With Removal Of Terminal Ileum With Ileocolostomy    Performing Service: Gastrointestinal  Surgeon(s) and Role:     * [name], MD - Primary     * Corrinne Eagle, MD - Resident - Assisting     * Kathryne Gin, MD - Resident - Assisting    Anesthesia: General    Estimated Blood Loss: 100 mL    Complications: None    Specimens: Terminal Ileum and Colon     Findings: Omentum stuck down over terminal ileum and right colon at side of previous resection. Terminal ileum and right colon mesentery thick, inflamed, and friable requiring multiple layers of suturing to control hemostasis. About 10 inches of bowel resected and new side to side stapled ileum to right colon anastomosis created. Hemostasis achieved at the conclusion of the case.     Procedure:   After adequate induction of general endotracheal anesthesia, the patient was positioned in supine position. The patient's abdomen was then prepped and draped in the usual sterile fashion. We then made a Pfannenstiel incision 2 fingerbreadths above the symphysis pubis with the rectus muscle. We dissected down to the anterior fascia, which was also opened transversely. Cephalad and caudad fascial flaps were then raised to the level of the umbilicus as well as the symphysis pubis. The peritoneal cavity was entered in the midline without complication. A wound protractor was then placed.     The omentum was stuck down over the terminal ileum and right colon. The mobilization of area required extensive lysis of adhesions. The bowel had a lot of creeping fat the mesentery was thickened and friable.. We mobilized the terminal ileum and right colon into the field of view. Our transection points were decided beyond the area of active disease. We first transected the right colon using a fire of the GIA 80 mm stapler. We then took the mesentery proximally to the area of transection chosen on the terminal ileum. The mesentery in between. This was taken with clamps and vicryl ties. It was thick and friable requiring multiple layers of vicryl ties and sutures to control hemostasis. Once we reached the area of the small bowel to transect, we came across it with another load of the GIA 80 mm stapler. The specimen was then passed off as the terminal ileum and colon. We lined up the ileum and colon side to side. A crotch stitch was placed to aid with alignment. We then created a side to side staple anastomosis on the anti-mesenteric side of the bowel using a single fire of the GIA 90 mm stapler. The staple line was hemostatic. We then closed the common enterotomy with a fire of the TA 60 mm stapler. The staple line had an area of bleeding that was oversewn with 3-0 vicryl. We then returned the bowel to the abdomen and irrigated. Hemostasis was achieved and we turned our attention to closure.     We then ran the peritoneum with running #1 PDS suture as was the fascia. Exparel was injected in the peritoneum, fasica, and subcutaneous tissue for postoperative pain management. The skin was approximated with subcuticular sutures. Provena wound vac applied. Counts were correct x2. The patient tolerated the procedure well, was extubated in the OR and taken to the PACU in stable condition.  Dr. Epifania Gore was present and participated in all critical aspects of the procedure.     Attending: Epifania Gore, MD  Resident: Corrinne Eagle, MD  Date: 04/25/2019  Time: 7:55 PM     I scrubbed for the entire procedure  Rae Halsted

## 2019-04-26 NOTE — Unmapped (Signed)
Updated on plan of care. Alert and oriented x3. Strip vac intact. Denies nausea. On PCA pain meds. PRN pain meds given for breakthrough pain. Intake and output recorded. Maintained on clears with some crackers as ordered. Foley intact with good amount of urine output. Had 2 BM but still not passing gas. Seen by on call at bedside. Will continue to monitor.    Problem: Adult Inpatient Plan of Care  Goal: Plan of Care Review  Outcome: Progressing  Goal: Patient-Specific Goal (Individualization)  Outcome: Progressing  Goal: Absence of Hospital-Acquired Illness or Injury  Outcome: Progressing  Goal: Optimal Comfort and Wellbeing  Outcome: Progressing  Goal: Readiness for Transition of Care  Outcome: Progressing  Goal: Rounds/Family Conference  Outcome: Progressing     Problem: Wound  Goal: Optimal Wound Healing  Outcome: Progressing     Problem: Fall Injury Risk  Goal: Absence of Fall and Fall-Related Injury  Outcome: Progressing

## 2019-04-26 NOTE — Unmapped (Signed)
Tobacco Treatment Program  Phone Visit Note    This medical encounter was conducted virtually using [email or web address]  TeleHealth protocols.      I have identified myself to the patient and conveyed my credentials to Ms. Gul  I have explained the capabilities and limitations of telemedicine and the patient/proxy and myself both agree that it is appropriate for their current circumstances/symptoms. The patient/proxy has given me verbal consent to proceed.     Patient's location at the time of the telephone visit: Hospitalized at [facility]   Provider's location at the time of the telephone visit: At home, in [facility] Information  Person Contacted: Ms. October Peery Phone number: [phone]       Phone Outcome: Talked to Pt   Is there someone else in the room? No.      Purpose of contact:     Ms. Jelinski is a 38 y.o. female is participating in a telephone visit for tobacco treatment counseling. Patient consented to telephone visit because of social distancing measures in place due to the COVID-19 pandemic.     NOTE:     SUMMARY: Sw utilized Motivational Interviewing techniques to assess Pt for treatment of tobacco use. Pt described smoking 1ppd to assist in alleviating stress. Pt expressed desire to minimize tobacco use. SW utilized MI techniques and helped Pt identify triggers. SW provided pscyhoeducation on 7 FDA approved cessation medications. Pt elected to try 4mg  nicotine gum/lozenge. SW will request Rx from Pt's medical provider. We discussed behavioral strategies to assist with managing urges and triggers.  Sw encouraged Pt to be mindful of triggers and to enact oral strategies that incorporate the use of toothpicks, drinking straws, hard candy or lollipops to assist in alleviating cravings. Sw and pt discussed reorganizing some parts of their day and incorporating use oral strategies whenever cravings emerge to promote tobacco free lifestyle and to disassociate daily activities from tobacco use. SW provided pt with information on physical improvements related to tobacco cessation, and available resources.Please see below for NRT recommendations in bold. Please send pt home with scripts for nicotine gum and lozenge at time of discharge.    Medications Recommended During Hospitalization: Patient declines  Outpatient/Discharge Medications Recommended: Lozenge 4mg , Gum 4mg     Tobacco Use Treatment  Program: Hospital Inpatient  Type of Visit: Initial  Tobacco Use Treatment Visit: Talked with patient  Goals Of Session: Communication of feelings, Assessment, Insight, increase, Problem-solving skill development, Self-esteem, improve, Relapse prevention skills training, Communication skills, improve, Stress management, increase    Tobacco Use During Past 30 Days  Time Since Last Tobacco Use: 1 to 7 days ago  Tobacco Withdrawal (Past 24 Hours): None noted  Type of Tobacco Products Used: Cigarettes  Quantity Used: 20  Quantity Per: day  Smoking Allowed in Home: Yes(Pt and her mother smoke in the home )    Tobacco Use History  Medications Used in Past Attempts: Nicotine Gum    Behavioral Assessment  Why Uses: 1. habit 2. stress-relief   Barriers/Challenges: 1. long-standing habit   Strategies: 1.Sw provided pscyhoeducation on 7 FDA approved cessation medications and guidelines for effective use of medication 2 Sw encouraged Pt to be mindful of triggers and to enact oral strategies that incorporate the use of toothpicks, drinking straws, hard candy or lollipops to assist in alleviating cravings 3. Sw and pt discussed reorganizing some parts  of their day and incorporating use oral strategies whenever cravings emerge to promote tobacco free lifestyle and to disassociate daily activities from tobacco use     Treatment Plan  Cessation Meds Currently Using: None  Medications Recommended During Hospitalization: Patient declines  Outpatient/Discharge Medications Recommended: Lozenge 4mg , Gum 4mg  Plan to Obtain Outpatient Meds: TTS messaged providers for Rx  Patient's Plan Post Discharge/Visit: Reduce tobacco use      TTS Information  Diagnosis: Tobacco use disorder, unspecified, uncomplicated (F17.200)  Interventions: Assessed, Behavioral techniques, Motivational interviewing, Informed, Discussed, Suggested, Taught, Encouraged, Supportive therapy, Psycho-education, Tx plan development  TTS Visit Length: >10 minutes    As part of this Telephone Visit, no in-person exam was conducted.    I spent 10 minutes on the telephone with the patient.   I spent an additional 7 minutes on pre- and post-visit activities (including charting, communication with medical team, and coordination of aftercare).      Visit Format/Coding: Telephone Call (Audio Only): Behavioral Health/Non-Physicians:  In charge capture section use [phone] (11-20 minutes)    Donita Brooks, MSW, LCSW, NCTTP  Clinical Social Worker/ Tobacco Treatment Specialist  Inpatient Tobacco Treatment Program   Phone: [phone]  Pager: [phone]

## 2019-04-27 DIAGNOSIS — R400 Somnolence: Secondary | ICD-10-CM

## 2019-04-27 DIAGNOSIS — R0902 Hypoxemia: Secondary | ICD-10-CM

## 2019-04-27 DIAGNOSIS — F419 Anxiety disorder, unspecified: Secondary | ICD-10-CM

## 2019-04-27 DIAGNOSIS — K660 Peritoneal adhesions (postprocedural) (postinfection): Secondary | ICD-10-CM

## 2019-04-27 DIAGNOSIS — K219 Gastro-esophageal reflux disease without esophagitis: Secondary | ICD-10-CM

## 2019-04-27 DIAGNOSIS — F05 Delirium due to known physiological condition: Secondary | ICD-10-CM

## 2019-04-27 DIAGNOSIS — K50018 Crohn's disease of small intestine with other complication: Secondary | ICD-10-CM

## 2019-04-27 DIAGNOSIS — Z716 Tobacco abuse counseling: Secondary | ICD-10-CM

## 2019-04-27 DIAGNOSIS — J449 Chronic obstructive pulmonary disease, unspecified: Secondary | ICD-10-CM

## 2019-04-27 DIAGNOSIS — F17210 Nicotine dependence, cigarettes, uncomplicated: Secondary | ICD-10-CM

## 2019-04-27 DIAGNOSIS — R339 Retention of urine, unspecified: Secondary | ICD-10-CM

## 2019-04-27 DIAGNOSIS — I10 Essential (primary) hypertension: Secondary | ICD-10-CM

## 2019-04-27 DIAGNOSIS — M797 Fibromyalgia: Secondary | ICD-10-CM

## 2019-04-27 LAB — BLOOD GAS CRITICAL CARE PANEL, ARTERIAL
BASE EXCESS ARTERIAL: -6.1 — ABNORMAL LOW (ref -2.0–2.0)
CALCIUM IONIZED ARTERIAL (MG/DL): 4.51 mg/dL (ref 4.40–5.40)
GLUCOSE WHOLE BLOOD: 133 mg/dL (ref 70–179)
HCO3 ARTERIAL: 19 mmol/L — ABNORMAL LOW (ref 22–27)
HEMOGLOBIN BLOOD GAS: 10.9 g/dL — ABNORMAL LOW (ref 12.00–16.00)
LACTATE BLOOD ARTERIAL: 0.9 mmol/L (ref <1.3)
O2 SATURATION ARTERIAL: 94.6 % (ref 94.0–100.0)
PO2 ARTERIAL: 77.3 mmHg — ABNORMAL LOW (ref 80.0–110.0)
POTASSIUM WHOLE BLOOD: 3.3 mmol/L — ABNORMAL LOW (ref 3.4–4.6)
SODIUM WHOLE BLOOD: 139 mmol/L (ref 135–145)

## 2019-04-27 LAB — BASIC METABOLIC PANEL
ANION GAP: 9 mmol/L (ref 7–15)
BLOOD UREA NITROGEN: 12 mg/dL (ref 7–21)
CALCIUM: 8 mg/dL — ABNORMAL LOW (ref 8.5–10.2)
CHLORIDE: 109 mmol/L — ABNORMAL HIGH (ref 98–107)
CO2: 20 mmol/L — ABNORMAL LOW (ref 22.0–30.0)
CREATININE: 0.88 mg/dL (ref 0.60–1.00)
EGFR CKD-EPI AA FEMALE: >90 mL/min/{1.73_m2} (ref >=60)
EGFR CKD-EPI NON-AA FEMALE: 84 mL/min/{1.73_m2} (ref >=60)
GLUCOSE RANDOM: 98 mg/dL (ref 70–179)
SODIUM: 138 mmol/L (ref 135–145)

## 2019-04-27 LAB — CBC
HEMATOCRIT: 38.8 % (ref 36.0–46.0)
MEAN CORPUSCULAR HEMOGLOBIN CONC: 31.1 g/dL (ref 31.0–37.0)
MEAN CORPUSCULAR HEMOGLOBIN: 27.9 pg (ref 26.0–34.0)
MEAN CORPUSCULAR VOLUME: 89.8 fL (ref 80.0–100.0)
MEAN PLATELET VOLUME: 7.9 fL (ref 7.0–10.0)
PLATELET COUNT: 402 10*9/L (ref 150–440)
RED BLOOD CELL COUNT: 4.32 10*12/L (ref 4.00–5.20)
RED CELL DISTRIBUTION WIDTH: 16.5 % — ABNORMAL HIGH (ref 12.0–15.0)
WBC ADJUSTED: 26.5 10*9/L — ABNORMAL HIGH (ref 4.5–11.0)

## 2019-04-27 LAB — TOXICOLOGY SCREEN, URINE
AMPHETAMINE SCREEN URINE: <500
BENZODIAZEPINE SCREEN, URINE: <200
CANNABINOID SCREEN URINE: <20
COCAINE(METAB.)SCREEN, URINE: <150
METHADONE SCREEN, URINE: <300

## 2019-04-27 LAB — MAGNESIUM: Magnesium:MCnc:Pt:Ser/Plas:Qn:: 2.7 — ABNORMAL HIGH

## 2019-04-27 LAB — GLUCOSE WHOLE BLOOD: Glucose:MCnc:Pt:Bld:Qn:: 133

## 2019-04-27 LAB — CHLORIDE: Chloride:SCnc:Pt:Ser/Plas:Qn:: 109 — ABNORMAL HIGH

## 2019-04-27 LAB — PHOSPHORUS: Phosphate:MCnc:Pt:Ser/Plas:Qn:: 2.8 — ABNORMAL LOW

## 2019-04-27 LAB — WBC ADJUSTED: Leukocytes:NCnc:Pt:Bld:Qn:: 26.5 — ABNORMAL HIGH

## 2019-04-27 LAB — BARBITURATE SCREEN URINE: Lab: <200

## 2019-04-27 NOTE — Unmapped (Signed)
ADULT SPECIALTY CARE TEAM  Transport Summary Note     Departing Unit: 6 WST Departure Time: 0540   Unit Returned To: BICU Return Time: 0620           Report received from primary nurse via SBARq. Patient prepared to transport to higher level of care bed in BICU via patient's bed under Step-down level of care. Vital signs during transport, see vital signs section of DocFlowsheet for further details. Patient is alert and able to follow commands. O2 via Venturi face mask @ 50 %. Patient tolerated procedure well. Pandemic Universal and Fall precautions maintained throughout transport.     Assisted ARRT with straight cath of pt prior to leaving for higher level of care. Pt belongings followed shortly after our arrival to [facility] via [employer] (pt had large number of belongings and all weren't able to be fit into pt bed).    Returned to [employer], update and care given to primary nurse. See Doc Flowsheet for additional transport documentation.

## 2019-04-27 NOTE — Unmapped (Signed)
Voiding adequately. VS stable, afebrile. Continues on heparin sq for DVT prevention. Independent w/ADL's. Wears non skid socks when oob for falls prevention. No falls or injury.  Low transverse incision w/strip vac intact to continuous . Pt ordered large amount of food but did not eat as needed PIV restart. New PIV placed in LFA for PCA, KVO IVF. Pt very anxious at baseline, became tearful when staff expressed concern when pt's sats were not maintaining above 90 on 2L. O2 increased to 4L. MD called to bedside to assess pt. Please see Nursing note for further description of event.  Problem: Adult Inpatient Plan of Care  Goal: Plan of Care Review  Outcome: Progressing     Problem: Adult Inpatient Plan of Care  Goal: Patient-Specific Goal (Individualization)  Outcome: Progressing     Problem: Adult Inpatient Plan of Care  Goal: Absence of Hospital-Acquired Illness or Injury  Outcome: Progressing     Problem: Adult Inpatient Plan of Care  Goal: Optimal Comfort and Wellbeing  Outcome: Progressing     Problem: Adult Inpatient Plan of Care  Goal: Readiness for Transition of Care  Outcome: Progressing     Problem: Adult Inpatient Plan of Care  Goal: Rounds/Family Conference  Outcome: Progressing     Problem: Wound  Goal: Optimal Wound Healing  Outcome: Progressing     Problem: Fall Injury Risk  Goal: Absence of Fall and Fall-Related Injury  Outcome: Progressing     Problem: Pain Chronic (Persistent) (Comorbidity Management)  Goal: Acceptable Pain Control and Functional Ability  Outcome: Progressing

## 2019-04-27 NOTE — Unmapped (Signed)
Daily Progress Note    Today's Date: 04/27/2019  Admission date: 04/25/2019  Length of stay: 2 days  Hospital Service: [name])  Attending: [name], MD    Assessment:    Principal Problem:    Crohn's disease of small intestine with other complication (CMS-HCC)  Active Problems:    Tobacco use disorder      Mary Hawkins is a 38 y.o. female, s/p Colectomy, Partial, With Removal Of Terminal Ileum With Ileocolostomy on 9/17.    2 Days Post-Op    Interval History  Overnight, ARR called due to new oxygen requirement and increased somnolence. Narcan given with appropriate response. CXR performed and pulm toilet ordered. Transferred to stepdown status for further monitoring. Now on Ventri mask FiO2 40% with O2 sats >92%. Complaining of diffuse abdominal pain     Plan:    - D/c PCA, PRN dilaudid. Oxycodone 5/10 mg Q4h PRN and scheduled tylenol. No extra narcotics   -replete phos   -regular diet  -none/medlocked   -continue strip vac until POD 3  --continue alvimopan until return of bowel function--loose stools over night likely residual colonic contents  - Reinserted foley due to large I/O; cont until 9/21. Consider initiation of Flomax.   -prophylaxis: subcut heparin 5,000 units tid and SCDs    Objective:    Vital signs in last 24 hours:  Temp:  [37 ??C] 37 ??C  Heart Rate:  [89-105] 94  SpO2 Pulse:  [93] 93  Resp:  [16-24] 20  BP: (107-140)/(59-92) 107/59  MAP (mmHg):  [76-109] 76  FiO2 (%):  [28 %-50 %] 40 %  SpO2:  [85 %-94 %] 92 %    Intake/Output this shift:  No intake/output data recorded.    Physical Exam:  Gen:  alert, well appearing, in no acute distress  CV:  regular rate, normotensive  Pulm:  breathing comfortably on room air  Abd:  soft and appropriately tender to palpation, bloated. Surgical incision with strip vac, adequate seal.  Ext:  warm and well perfused bilaterally, no edema        WBC       Lab Results   Component Value Date    WBC 26.5 (H) 04/27/2019    HGB 12.1 04/27/2019    HCT 38.8 04/27/2019    PLT 402 04/27/2019       Lab Results   Component Value Date    NA 139 04/27/2019    K 3.3 (L) 04/27/2019    CL 109 (H) 04/27/2019    CO2 20.0 (L) 04/27/2019    BUN 12 04/27/2019    CREATININE 0.88 04/27/2019    CALCIUM 8.0 (L) 04/27/2019    MG 2.7 (H) 04/27/2019    PHOS 2.8 (L) 04/27/2019       Lab Results   Component Value Date    PT 10.1 (L) 09/09/2018    INR 0.88 09/09/2018    APTT 30.1 09/09/2018       Lab Results   Component Value Date    ALKPHOS 108 11/20/2018    BILITOT 0.2 11/20/2018    BILIDIR 0.2 02/18/2014    PROT 6.5 11/20/2018    ALBUMIN 3.6 11/20/2018    ALT 20 11/20/2018    AST 20 11/20/2018       Lab Results   Component Value Date    ALBUMIN 3.6 11/20/2018    TTR 32 11/12/2013

## 2019-04-27 NOTE — Unmapped (Signed)
Pt A&Ox4, VSS. Reports pain 8-10 out of 10, encouraged to press PCA and request PRN pain meds. Pt noted to be drowsy and asked to call for help when getting OOB. On reg diet, tolerating well, denies n/v. abd stripvac intact. Voiding adequately and spontaneously. Reports +ROBF. Able to ambulate in room. SCDs refused. Will cont to monitor.       Problem: Adult Inpatient Plan of Care  Goal: Plan of Care Review  Outcome: Ongoing - Unchanged  Goal: Patient-Specific Goal (Individualization)  Outcome: Ongoing - Unchanged  Goal: Absence of Hospital-Acquired Illness or Injury  Outcome: Ongoing - Unchanged  Goal: Optimal Comfort and Wellbeing  Outcome: Ongoing - Unchanged  Goal: Readiness for Transition of Care  Outcome: Ongoing - Unchanged  Goal: Rounds/Family Conference  Outcome: Ongoing - Unchanged     Problem: Wound  Goal: Optimal Wound Healing  Outcome: Ongoing - Unchanged     Problem: Fall Injury Risk  Goal: Absence of Fall and Fall-Related Injury  Outcome: Ongoing - Unchanged

## 2019-04-27 NOTE — Unmapped (Signed)
Pt is Rapid from 6W d/t increased O2 requirements. Arrived to BICU at approximately 0625. Placed on 2L via [place] by RT. Pt continues to desat. Currently on 6L O2 vias [place]. Otherwise VSS.   Home meds found in pt belongs by [employer]. CRN is aware. Belongings are not within pt's reach.      Problem: Adult Inpatient Plan of Care  Goal: Plan of Care Review  Outcome: Ongoing - Unchanged  Goal: Patient-Specific Goal (Individualization)  Outcome: Ongoing - Unchanged  Goal: Absence of Hospital-Acquired Illness or Injury  Outcome: Ongoing - Unchanged  Goal: Optimal Comfort and Wellbeing  Outcome: Ongoing - Unchanged  Goal: Readiness for Transition of Care  Outcome: Ongoing - Unchanged  Goal: Rounds/Family Conference  Outcome: Ongoing - Unchanged     Problem: Wound  Goal: Optimal Wound Healing  Outcome: Ongoing - Unchanged     Problem: Fall Injury Risk  Goal: Absence of Fall and Fall-Related Injury  Outcome: Ongoing - Unchanged     Problem: Pain Chronic (Persistent) (Comorbidity Management)  Goal: Acceptable Pain Control and Functional Ability  Outcome: Ongoing - Unchanged

## 2019-04-28 LAB — PHOSPHORUS: Phosphate:MCnc:Pt:Ser/Plas:Qn:: 3

## 2019-04-28 LAB — BASIC METABOLIC PANEL
ANION GAP: 5 mmol/L — ABNORMAL LOW (ref 7–15)
BLOOD UREA NITROGEN: 8 mg/dL (ref 7–21)
CALCIUM: 7.7 mg/dL — ABNORMAL LOW (ref 8.5–10.2)
CHLORIDE: 113 mmol/L — ABNORMAL HIGH (ref 98–107)
CO2: 20 mmol/L — ABNORMAL LOW (ref 22.0–30.0)
EGFR CKD-EPI AA FEMALE: >90 mL/min/{1.73_m2} (ref >=60)
EGFR CKD-EPI NON-AA FEMALE: >90 mL/min/{1.73_m2} (ref >=60)
GLUCOSE RANDOM: 95 mg/dL (ref 70–179)
SODIUM: 138 mmol/L (ref 135–145)

## 2019-04-28 LAB — CBC
HEMATOCRIT: 31.3 % — ABNORMAL LOW (ref 36.0–46.0)
HEMOGLOBIN: 9.8 g/dL — ABNORMAL LOW (ref 12.0–16.0)
MEAN CORPUSCULAR HEMOGLOBIN CONC: 31.4 g/dL (ref 31.0–37.0)
MEAN CORPUSCULAR HEMOGLOBIN: 27.7 pg (ref 26.0–34.0)
MEAN CORPUSCULAR VOLUME: 88.3 fL (ref 80.0–100.0)
MEAN PLATELET VOLUME: 7.9 fL (ref 7.0–10.0)
PLATELET COUNT: 414 10*9/L (ref 150–440)
RED BLOOD CELL COUNT: 3.54 10*12/L — ABNORMAL LOW (ref 4.00–5.20)
RED CELL DISTRIBUTION WIDTH: 16.7 % — ABNORMAL HIGH (ref 12.0–15.0)
WBC ADJUSTED: 17.6 10*9/L — ABNORMAL HIGH (ref 4.5–11.0)

## 2019-04-28 LAB — C-REACTIVE PROTEIN: C reactive protein:MCnc:Pt:Ser/Plas:Qn:: 330.3 — ABNORMAL HIGH

## 2019-04-28 LAB — PLATELET COUNT: Platelets:NCnc:Pt:Bld:Qn:Automated count: 414

## 2019-04-28 LAB — EGFR CKD-EPI AA FEMALE: Lab: >90

## 2019-04-28 LAB — MAGNESIUM: Magnesium:MCnc:Pt:Ser/Plas:Qn:: 2.1

## 2019-04-28 NOTE — Unmapped (Signed)
Pt's A&O X 3/4, during the am part of the shift, pt was drowsy and disoriented to the situation. Pt VSS,  pt on 6l [place] with O2 sat at 93%. Pt c/o of 9/10 through out shift. Received 5mg  of oxy. Current pain regime is not managing pain adequately. Med team notified, and did not want to increase pain med due to concern of increased oxygen need. Patient is highly uncomfortable and will like to see and speak to the team about her pain. Pt refuses scds for VTE proph, but takes heparin. No bm, adequate uop. Pt had personal home meds at bed side. Charge nurse notified and meds have been placed in patient specific. Pt had poor appetite during shift. Standard precautions maintained. Bed low, call bell and bedside table w/in reach. Will CTM.      Problem: Adult Inpatient Plan of Care  Goal: Plan of Care Review  Outcome: Progressing  Goal: Patient-Specific Goal (Individualization)  Outcome: Progressing  Goal: Absence of Hospital-Acquired Illness or Injury  Outcome: Progressing  Goal: Optimal Comfort and Wellbeing  Outcome: Progressing  Goal: Readiness for Transition of Care  Outcome: Progressing  Goal: Rounds/Family Conference  Outcome: Progressing     Problem: Wound  Goal: Optimal Wound Healing  Outcome: Progressing     Problem: Fall Injury Risk  Goal: Absence of Fall and Fall-Related Injury  Outcome: Progressing     Problem: Pain Chronic (Persistent) (Comorbidity Management)  Goal: Acceptable Pain Control and Functional Ability  Outcome: Progressing

## 2019-04-28 NOTE — Unmapped (Signed)
Daily Progress Note    Today's Date: 04/27/2019  Admission date: 04/25/2019  Length of stay: 2 days  Hospital Service: [name])  Attending: [name], MD    Assessment:    Principal Problem:    Crohn's disease of small intestine with other complication (CMS-HCC)  Active Problems:    Tobacco use disorder      Mary Hawkins is a 38 y.o. female, s/p Colectomy, Partial, With Removal Of Terminal Ileum With Ileocolostomy on 9/17.    2 Days Post-Op    Interval History  NAEON, pain poorly controlled multiple request for increasing pain regimen. Now on oxycodone 5/10 with dilaudid PRN for breakthrough pain.     Plan:  - Cont pain management with 5/10 Oxycodone Q6h PRN and scheduled tylenol. Avoid extra narcotics.   - Given COPD, maintain O2 sats above 88%. Aggressive pulm toilet and wean oxygen aggressively.   - [place]lete electrolytes as needed    - Medlocked   - D/c stripvac   --continue alvimopan until return of bowel function - no BM overnight   - Cont foley due to large I/O on 9/19; cont until 9/21. Consider initiation of Flomax.   -prophylaxis: subcut heparin 5,000 units tid and SCDs    Objective:    Vital signs in last 24 hours:  Temp:  [36.4 ??C-37.1 ??C] 36.4 ??C  Heart Rate:  [93-105] 103  SpO2 Pulse:  [93-103] 102  Resp:  [18-24] 18  BP: (100-140)/(58-92) 112/61  MAP (mmHg):  [70-109] 76  FiO2 (%):  [28 %-50 %] 40 %  SpO2:  [85 %-95 %] 95 %    Intake/Output this shift:  No intake/output data recorded.    Physical Exam:  Gen:  alert, well appearing, in no acute distress  CV:  regular rate, normotensive  Pulm:  breathing comfortably on room air  Abd:  soft and appropriately tender to palpation, bloated. Surgical incision with strip vac, adequate seal.  Ext:  warm and well perfused bilaterally, no edema        WBC       Lab Results   Component Value Date    WBC 26.5 (H) 04/27/2019    HGB 12.1 04/27/2019    HCT 38.8 04/27/2019    PLT 402 04/27/2019       Lab Results   Component Value Date    NA 139 04/27/2019    K 3.3 (L) 04/27/2019    CL 109 (H) 04/27/2019    CO2 20.0 (L) 04/27/2019    BUN 12 04/27/2019    CREATININE 0.88 04/27/2019    CALCIUM 8.0 (L) 04/27/2019    MG 2.7 (H) 04/27/2019    PHOS 2.8 (L) 04/27/2019       Lab Results   Component Value Date    PT 10.1 (L) 09/09/2018    INR 0.88 09/09/2018    APTT 30.1 09/09/2018       Lab Results   Component Value Date    ALKPHOS 108 11/20/2018    BILITOT 0.2 11/20/2018    BILIDIR 0.2 02/18/2014    PROT 6.5 11/20/2018    ALBUMIN 3.6 11/20/2018    ALT 20 11/20/2018    AST 20 11/20/2018       Lab Results   Component Value Date    ALBUMIN 3.6 11/20/2018    TTR 32 11/12/2013

## 2019-04-29 LAB — BASIC METABOLIC PANEL
ANION GAP: 3 mmol/L — ABNORMAL LOW (ref 7–15)
BLOOD UREA NITROGEN: 9 mg/dL (ref 7–21)
BUN / CREAT RATIO: 11
CALCIUM: 8.2 mg/dL — ABNORMAL LOW (ref 8.5–10.2)
CHLORIDE: 110 mmol/L — ABNORMAL HIGH (ref 98–107)
CO2: 22 mmol/L (ref 22.0–30.0)
CREATININE: 0.82 mg/dL (ref 0.60–1.00)
GLUCOSE RANDOM: 100 mg/dL (ref 70–179)
POTASSIUM: 3.7 mmol/L (ref 3.5–5.0)
SODIUM: 135 mmol/L (ref 135–145)

## 2019-04-29 LAB — MAGNESIUM: Magnesium:MCnc:Pt:Ser/Plas:Qn:: 1.8

## 2019-04-29 LAB — CHLORIDE: Chloride:SCnc:Pt:Ser/Plas:Qn:: 110 — ABNORMAL HIGH

## 2019-04-29 LAB — PHOSPHORUS: Phosphate:MCnc:Pt:Ser/Plas:Qn:: 2.8 — ABNORMAL LOW

## 2019-04-29 MED ORDER — NICOTINE (POLACRILEX) 4 MG BUCCAL LOZENGE
BUCCAL | 0 refills | 6.00000 days | Status: CP | PRN
Start: 2019-04-29 — End: 2019-05-29
  Filled 2019-04-29: qty 110, 10d supply, fill #0
  Filled 2019-04-29: qty 72, 6d supply, fill #0

## 2019-04-29 MED ORDER — NICOTINE (POLACRILEX) 4 MG GUM
BUCCAL | 0 refills | 10 days | Status: CP | PRN
Start: 2019-04-29 — End: 2019-05-29

## 2019-04-29 MED ORDER — OXYCODONE 5 MG TABLET
ORAL_TABLET | 0 refills | 0 days | Status: SS
Start: 2019-04-29 — End: 2019-05-06

## 2019-04-29 MED FILL — NICOTINE (POLACRILEX) 4 MG GUM: 10 days supply | Qty: 110 | Fill #0 | Status: AC

## 2019-04-29 MED FILL — OXYCODONE 5 MG TABLET: 3 days supply | Qty: 30 | Fill #0

## 2019-04-29 MED FILL — NICOTINE (POLACRILEX) 4 MG BUCCAL LOZENGE: 6 days supply | Qty: 72 | Fill #0 | Status: AC

## 2019-04-29 MED FILL — OXYCODONE 5 MG TABLET: 3 days supply | Qty: 30 | Fill #0 | Status: AC

## 2019-04-29 NOTE — Unmapped (Signed)
[facility] Discharge Summary     Identifying Information: Mary Hawkins  1981/02/04  [id number]   Code Status: Full Code   PCP:  [facility]   Primary Surgeon:  Primary: [name], MD  Resident - Assisting: Corrinne Eagle, MD; Kathryne Gin, MD       Admit date: 04/25/2019   Discharge date: 04/29/2019   Discharge Attending: [name], MD  Discharge Service: [name])  Discharge to: Home     Discharge Primary Diagnosis:   Principal Problem:    Crohn's disease of small intestine with other complication (CMS-HCC)  Active Problems:    Tobacco use disorder        Hospital Course   Mary Hawkins underwent colectomy, Partial, With Removal Of Terminal Ileum With Ileocolostomy on 04/25/2019. She tolerated the procedure well, was extubated in the OR, and was taken to the PACU and received routine postoperative care before being transferred to the floor. She required replacement of foley catheter for retention on POD 2. Rapid response was called on POD 2 for increasing O2 requirement and somnolence. Mental status improved with narcan. She was transferred to ISCU for closer monitoring and O2 was weaned. She was made floor status on POD 4. Foley was removed again on POD 4 and she was able to void adequately.  Her diet was slowly advanced and at the time of discharge, she was tolerating a regular diet. Pain medications were transitioned to oral without difficulty.  She is being discharged in stable condition on 04/29/2019.    Condition at Discharge: good  Patient is back to their functional baseline and is self-managing all activites of daily living.     Discharge Day Note:     Assessment and Discharge Day Services:  The patient was seen and examined by the [facility]) team on the day of discharge. Vital signs and laboratory values were assessed. Surgical wounds were inspected. Discharge plan was discussed, instructions were given, and all questions answered. Subjective:  No acute events overnight. Pain Controlled. No fever or chills.    Objective:   BP 101/70  - Pulse 94  - Temp 36.9 ??C (98.4 ??F)  - Resp 26  - Ht 157.5 cm (5' 2)  - Wt 84 kg (185 lb 3 oz)  - LMP 04/24/2019  - SpO2 93%  - BMI 33.87 kg/m??     General Appearance:   No acute distress  Abdomen:               soft, nontender, non-distended, no peritoneal signs and incision clean, dry, intact  Discharge Medications:      Your Medication List      STOP taking these medications    BD LUER-LOK SYRINGE 3 mL 25 x 5/8 Syrg  Generic drug: syringe with needle     diphenoxylate-atropine 2.5-0.025 mg per tablet  Commonly known as: LomotiL     empty container Misc     ondansetron 8 MG disintegrating tablet  Commonly known as: ZOFRAN-ODT        START taking these medications    nicotine polacrilex 4 MG gum  Commonly known as: NICORETTE  Apply 1 each (4 mg total) to cheek every two (2) hours as needed for smoking cessation.     nicotine polacrilex 4 MG lozenge  Commonly known as: NICORETTE  Apply 1 lozenge (4 mg total) to cheek every two (2) hours as needed for smoking cessation.  oxyCODONE 5 MG immediate release tablet  Commonly known as: ROXICODONE  1-2 tabs every 4-6 hrs prn pain        CONTINUE taking these medications    acetaminophen 500 MG tablet  Commonly known as: TYLENOL  Take 1,000 mg by mouth every six (6) hours as needed for pain.     aspirin 325 MG delayed release (EC) tablet  Take 325 mg by mouth daily.     cyanocobalamin 1,000 mcg/mL injection  Inject 1 mL (1,000 mcg total) under the skin every thirty (30) days.     DULoxetine 30 MG capsule  Commonly known as: CYMBALTA  Take 3 capsules (90 mg total) by mouth daily.     famotidine 40 MG tablet  Commonly known as: PEPCID  Take 1/2 tablet (20 mg total) by mouth Two (2) times a day.     gabapentin 800 MG tablet  Commonly known as: NEURONTIN  Take 800 mg by mouth Four (4) times a day with a meal and nightly.     hydrOXYzine 50 MG tablet  Commonly known as: ATARAX  Take 1 each (50 mg total) by mouth Three (3) times a day as needed.     magnesium oxide 400 mg (241.3 mg magnesium) tablet  Commonly known as: MAG-OX  Take 2 tablets (800 mg total) by mouth Two (2) times a day.     omeprazole 40 MG capsule  Commonly known as: PriLOSEC  Take 40 mg by mouth daily.     pantoprazole 40 MG tablet  Commonly known as: PROTONIX  Take 1 tablet (40 mg total) by mouth daily.     polyethylene glycol 17 gram packet  Commonly known as: MIRALAX  Take 17 g by mouth nightly as needed.     potassium chloride 20 MEQ CR tablet  Commonly known as: KLOR-CON  Take 1 tablet (20 mEq total) by mouth daily.     pramipexole 0.75 MG tablet  Commonly known as: MIRAPEX  Take 0.75 mg by mouth nightly.              Discharge Instructions:  Follow Up instructions and Outpatient Referrals     Discharge instructions            Other Instructions     Discharge instructions      REASONS TO CALL after discharge:  You have a fever with a temperature of more than 101.5 F.  You have new nausea and vomiting that you did not have in the hospital, or you have worse nausea and vomiting than when you were in the hospital.  You are not eating or drinking because of nausea and vomiting.  Your pain is getting worse, not better.  You feel dehydrated - your urine is very dark, you feel light-headed, dizzy and weak.  Your incision is turning red, has opened up or has new drainage.   You have any concerns and are not sure if you should be worried.      WHO TO CONTACT after discharge:    For all questions/concerns DURING BUSINESS HOURS (Monday-Friday, 8:00 am - 4:30 pm) call your Nurse Coordinator:    Everlene Balls at [phone]        Nurse Coordinator for Dr. Epifania Gore    If you are unable to reach your Nurse Coordinator directly during business hours, you may call the [facility] Surgery Clinic at [phone].    For URGENT CONCERNS AFTER BUSINESS HOURS call the [facility] Operator at [phone] and ask  for the Surgical Resident On-Call. You will be directed to a surgery resident who is likely not familiar with your specific care, but can help you deal with issues that cannot wait until regular business hours. Please be aware that the person is responding to many in-hospital emergencies and patient issues, and may not answer your phone call immediately.    To SCHEDULE a FOLLOW-UP APPOINTMENT between 8:00 am - 5:00 pm call:  [facility]  [phone]                 Future Appointments:  Appointments which have been scheduled for you    May 14, 2019  1:30 PM  (Arrive by 1:00 PM)  RETURN  GENERAL with [name], MD  [facility] MULTISPECIALTY SURGERY GI SURGERY [facility] REGION) [address]  [place] [address]  [phone]      May 14, 2019  2:30 PM  (Arrive by 2:00 PM)  RETURN VIDEO - OTHER with Thurmon Fair, MD  [facility] [place] [facility] REGION) [address]  [place] [address]  [phone]      May 22, 2019 10:40 AM  (Arrive by 10:10 AM)  NEW GYN RESIDENT with Holli Humbles  [facility] REGION) [address]  [place] [address]  [phone]             I personally spent > 30 minutes in discharge planning  Ellie Lunch, MSN, FNP

## 2019-04-29 NOTE — Unmapped (Signed)
Problem: Adult Inpatient Plan of Care  Goal: Plan of Care Review  Outcome: Progressing  Flowsheets (Taken 04/29/2019 0507)  Progress: improving  Plan of Care Reviewed With: patient  Note: 7P shift summary:    Pt VSS throughout shift, room air-3L , pt takes on and off. Only has desat while sleeping. Pain managed with oxy and tylenol. Tolerating reg diet. OOb to bathroom with 1 assist and walker. BM over night. Foley patent and adequate. Will continue to monitor.  Goal: Patient-Specific Goal (Individualization)  Outcome: Progressing  Goal: Absence of Hospital-Acquired Illness or Injury  Outcome: Progressing  Goal: Optimal Comfort and Wellbeing  Outcome: Progressing  Goal: Readiness for Transition of Care  Outcome: Progressing  Goal: Rounds/Family Conference  Outcome: Progressing     Problem: Wound  Goal: Optimal Wound Healing  Outcome: Progressing     Problem: Fall Injury Risk  Goal: Absence of Fall and Fall-Related Injury  Outcome: Progressing     Problem: Pain Chronic (Persistent) (Comorbidity Management)  Goal: Acceptable Pain Control and Functional Ability  Outcome: Progressing     Problem: Self-Care Deficit  Goal: Improved Ability to Complete Activities of Daily Living  Outcome: Progressing

## 2019-04-29 NOTE — Unmapped (Signed)
VSS. SpO2>93% on RA. Foley removed & pt voids by herself adequately. Pt instructed with discharge information. Belongings checked.   Problem: Adult Inpatient Plan of Care  Goal: Plan of Care Review  Outcome: Resolved  Goal: Patient-Specific Goal (Individualization)  Outcome: Resolved  Goal: Absence of Hospital-Acquired Illness or Injury  Outcome: Resolved  Goal: Optimal Comfort and Wellbeing  Outcome: Resolved  Goal: Readiness for Transition of Care  Outcome: Resolved  Goal: Rounds/Family Conference  Outcome: Resolved     Problem: Wound  Goal: Optimal Wound Healing  Outcome: Resolved     Problem: Fall Injury Risk  Goal: Absence of Fall and Fall-Related Injury  Outcome: Resolved     Problem: Pain Chronic (Persistent) (Comorbidity Management)  Goal: Acceptable Pain Control and Functional Ability  Outcome: Resolved     Problem: Self-Care Deficit  Goal: Improved Ability to Complete Activities of Daily Living  Outcome: Resolved

## 2019-04-29 NOTE — Unmapped (Addendum)
VSS. SpO2>93% on [place] at 4L, pt tolerates well at 3L after 2pm. Pt AxOx4. Pt desats once when ambulating to the bathroom, intervened by increasing [place] at 6L. Pt continues to have chronic & surgical pain on the abdomen, controlled by PRN Oxy & Dilaudid. Pt consumes adequate food & fluids, no complaint of N/V. Pt has adequate UOP through Foley, 1 BM this shift. Will continue to monitor.   Problem: Adult Inpatient Plan of Care  Goal: Plan of Care Review  Outcome: Ongoing - Unchanged  Flowsheets (Taken 04/28/2019 1708)  Progress: no change  Plan of Care Reviewed With: patient  Goal: Patient-Specific Goal (Individualization)  Outcome: Ongoing - Unchanged  Flowsheets (Taken 04/28/2019 1708)  Patient-Specific Goals (Include Timeframe): Pt wil have pain under control, specifically less than 3/10 by 04/29/2019  Individualized Care Needs: Pt prefers warm room temp but blanket over feet  Goal: Absence of Hospital-Acquired Illness or Injury  Outcome: Ongoing - Unchanged  Goal: Optimal Comfort and Wellbeing  Outcome: Ongoing - Unchanged  Goal: Readiness for Transition of Care  Outcome: Ongoing - Unchanged  Goal: Rounds/Family Conference  Outcome: Ongoing - Unchanged     Problem: Wound  Goal: Optimal Wound Healing  Outcome: Ongoing - Unchanged     Problem: Fall Injury Risk  Goal: Absence of Fall and Fall-Related Injury  Outcome: Ongoing - Unchanged     Problem: Pain Chronic (Persistent) (Comorbidity Management)  Goal: Acceptable Pain Control and Functional Ability  Outcome: Ongoing - Unchanged     Problem: Self-Care Deficit  Goal: Improved Ability to Complete Activities of Daily Living  Outcome: Ongoing - Unchanged

## 2019-04-29 NOTE — Unmapped (Signed)
Daily Progress Note    Today's Date: 04/29/2019  Admission date: 04/25/2019  Length of stay: 4 days  Hospital Service: [name])  Attending: [name], MD    Assessment:    Principal Problem:    Crohn's disease of small intestine with other complication (CMS-HCC)  Active Problems:    Tobacco use disorder      Mary Hawkins is a 38 y.o. female, s/p Colectomy, Partial, With Removal Of Terminal Ileum With Ileocolostomy on 9/17.    4 Days Post-Op    Interval History  NAEON, off O2 with sats 92%. + stools. Possible d/c this afternoon.     Plan:  - Cont pain management with 5/10 Oxycodone Q6h PRN and scheduled tylenol. Avoid extra narcotics.   - Given COPD, maintain O2 sats above 88%. Aggressive pulm toilet and wean oxygen aggressively.   - Medlocked    -d/c foley for repeat TOV  -prophylaxis: subcut heparin 5,000 units tid and SCDs   -dispo: transfer to floor    Objective:    Vital signs in last 24 hours:  Temp:  [36.6 ??C (97.9 ??F)-37.3 ??C (99.1 ??F)] 37.1 ??C (98.8 ??F)  Heart Rate:  [86-97] 97  SpO2 Pulse:  [71-102] 102  Resp:  [21-28] 28  BP: (100-108)/(57-70) 108/70  MAP (mmHg):  [71-79] 78  FiO2 (%):  [3 %] 3 %  SpO2:  [92 %-97 %] 92 %    Intake/Output this shift:  No intake/output data recorded.    Physical Exam:  Gen:  alert, well appearing, in no acute distress  CV:  regular rate, normotensive  Pulm:  breathing comfortably on room air  Abd:  soft and appropriately tender to palpation, mildly bloated. Surgical wound approximated without erythema or drainage.  Ext:  warm and well perfused bilaterally, no edema        WBC       Lab Results   Component Value Date    WBC 17.6 (H) 04/28/2019    HGB 9.8 (L) 04/28/2019    HCT 31.3 (L) 04/28/2019    PLT 414 04/28/2019       Lab Results   Component Value Date    NA 135 04/29/2019    K 3.7 04/29/2019    CL 110 (H) 04/29/2019    CO2 22.0 04/29/2019    BUN 9 04/29/2019    CREATININE 0.82 04/29/2019    CALCIUM 8.2 (L) 04/29/2019    MG 1.8 04/29/2019    PHOS 2.8 (L) 04/29/2019       Lab Results   Component Value Date    PT 10.1 (L) 09/09/2018    INR 0.88 09/09/2018    APTT 30.1 09/09/2018       Lab Results   Component Value Date    ALKPHOS 108 11/20/2018    BILITOT 0.2 11/20/2018    BILIDIR 0.2 02/18/2014    PROT 6.5 11/20/2018    ALBUMIN 3.6 11/20/2018    ALT 20 11/20/2018    AST 20 11/20/2018       Lab Results   Component Value Date    ALBUMIN 3.6 11/20/2018    TTR 32 11/12/2013

## 2019-04-30 ENCOUNTER — Encounter (HOSPITAL_COMMUNITY): Payer: Self-pay | Admitting: Emergency Medicine

## 2019-04-30 ENCOUNTER — Inpatient Hospital Stay (HOSPITAL_COMMUNITY)
Admission: EM | Admit: 2019-04-30 | Discharge: 2019-05-03 | DRG: 394 | Disposition: A | Discharge status: Other Acute Inpatient Hospital | Payer: Medicaid Other | Attending: General Surgery | Admitting: General Surgery

## 2019-04-30 ENCOUNTER — Other Ambulatory Visit: Payer: Self-pay

## 2019-04-30 ENCOUNTER — Emergency Department (HOSPITAL_COMMUNITY): Payer: Medicaid Other

## 2019-04-30 DIAGNOSIS — K5080 Crohn's disease of both small and large intestine without complications: Secondary | ICD-10-CM | POA: Diagnosis present

## 2019-04-30 DIAGNOSIS — K5090 Crohn's disease, unspecified, without complications: Secondary | ICD-10-CM | POA: Diagnosis present

## 2019-04-30 DIAGNOSIS — Z79899 Other long term (current) drug therapy: Secondary | ICD-10-CM

## 2019-04-30 DIAGNOSIS — E876 Hypokalemia: Secondary | ICD-10-CM | POA: Diagnosis not present

## 2019-04-30 DIAGNOSIS — Z7982 Long term (current) use of aspirin: Secondary | ICD-10-CM

## 2019-04-30 DIAGNOSIS — G43009 Migraine without aura, not intractable, without status migrainosus: Secondary | ICD-10-CM | POA: Diagnosis present

## 2019-04-30 DIAGNOSIS — R400 Somnolence: Secondary | ICD-10-CM | POA: Diagnosis not present

## 2019-04-30 DIAGNOSIS — K219 Gastro-esophageal reflux disease without esophagitis: Secondary | ICD-10-CM | POA: Diagnosis present

## 2019-04-30 DIAGNOSIS — R109 Unspecified abdominal pain: Secondary | ICD-10-CM

## 2019-04-30 DIAGNOSIS — F411 Generalized anxiety disorder: Secondary | ICD-10-CM | POA: Diagnosis present

## 2019-04-30 DIAGNOSIS — M797 Fibromyalgia: Secondary | ICD-10-CM | POA: Diagnosis present

## 2019-04-30 DIAGNOSIS — K567 Ileus, unspecified: Secondary | ICD-10-CM | POA: Diagnosis present

## 2019-04-30 DIAGNOSIS — J45909 Unspecified asthma, uncomplicated: Secondary | ICD-10-CM | POA: Diagnosis present

## 2019-04-30 DIAGNOSIS — Z8711 Personal history of peptic ulcer disease: Secondary | ICD-10-CM

## 2019-04-30 DIAGNOSIS — K9189 Other postprocedural complications and disorders of digestive system: Secondary | ICD-10-CM

## 2019-04-30 DIAGNOSIS — Z4659 Encounter for fitting and adjustment of other gastrointestinal appliance and device: Secondary | ICD-10-CM

## 2019-04-30 DIAGNOSIS — Z800 Family history of malignant neoplasm of digestive organs: Secondary | ICD-10-CM

## 2019-04-30 DIAGNOSIS — K668 Other specified disorders of peritoneum: Secondary | ICD-10-CM

## 2019-04-30 DIAGNOSIS — Z9049 Acquired absence of other specified parts of digestive tract: Secondary | ICD-10-CM

## 2019-04-30 DIAGNOSIS — F17210 Nicotine dependence, cigarettes, uncomplicated: Secondary | ICD-10-CM | POA: Diagnosis present

## 2019-04-30 DIAGNOSIS — E538 Deficiency of other specified B group vitamins: Secondary | ICD-10-CM | POA: Diagnosis present

## 2019-04-30 DIAGNOSIS — Z880 Allergy status to penicillin: Secondary | ICD-10-CM

## 2019-04-30 DIAGNOSIS — D899 Disorder involving the immune mechanism, unspecified: Secondary | ICD-10-CM | POA: Diagnosis present

## 2019-04-30 DIAGNOSIS — I10 Essential (primary) hypertension: Secondary | ICD-10-CM | POA: Diagnosis present

## 2019-04-30 DIAGNOSIS — Y832 Surgical operation with anastomosis, bypass or graft as the cause of abnormal reaction of the patient, or of later complication, without mention of misadventure at the time of the procedure: Secondary | ICD-10-CM | POA: Diagnosis present

## 2019-04-30 DIAGNOSIS — K56609 Unspecified intestinal obstruction, unspecified as to partial versus complete obstruction: Secondary | ICD-10-CM

## 2019-04-30 DIAGNOSIS — F909 Attention-deficit hyperactivity disorder, unspecified type: Secondary | ICD-10-CM | POA: Diagnosis present

## 2019-04-30 DIAGNOSIS — Z881 Allergy status to other antibiotic agents status: Secondary | ICD-10-CM

## 2019-04-30 DIAGNOSIS — G8929 Other chronic pain: Secondary | ICD-10-CM | POA: Diagnosis present

## 2019-04-30 DIAGNOSIS — Z832 Family history of diseases of the blood and blood-forming organs and certain disorders involving the immune mechanism: Secondary | ICD-10-CM

## 2019-04-30 DIAGNOSIS — Z803 Family history of malignant neoplasm of breast: Secondary | ICD-10-CM

## 2019-04-30 DIAGNOSIS — Z20828 Contact with and (suspected) exposure to other viral communicable diseases: Secondary | ICD-10-CM | POA: Diagnosis present

## 2019-04-30 LAB — URINALYSIS, ROUTINE W REFLEX MICROSCOPIC
Bilirubin Urine: NEGATIVE
Glucose, UA: NEGATIVE mg/dL
Hgb urine dipstick: NEGATIVE
Ketones, ur: NEGATIVE mg/dL
Leukocytes,Ua: NEGATIVE
Nitrite: NEGATIVE
Protein, ur: 30 mg/dL — AB
Specific Gravity, Urine: 1.012 (ref 1.005–1.030)
pH: 7 (ref 5.0–8.0)

## 2019-04-30 LAB — I-STAT BETA HCG BLOOD, ED (MC, WL, AP ONLY): I-stat hCG, quantitative: <5 m[IU]/mL (ref <5)

## 2019-04-30 LAB — COMPREHENSIVE METABOLIC PANEL
ALT: 16 U/L (ref 0–44)
AST: 11 U/L — ABNORMAL LOW (ref 15–41)
Albumin: 2.9 g/dL — ABNORMAL LOW (ref 3.5–5.0)
Alkaline Phosphatase: 156 U/L — ABNORMAL HIGH (ref 38–126)
Anion gap: 11 (ref 5–15)
BUN: 10 mg/dL (ref 6–20)
CO2: 19 mmol/L — ABNORMAL LOW (ref 22–32)
Calcium: 8.9 mg/dL (ref 8.9–10.3)
Chloride: 109 mmol/L (ref 98–111)
Creatinine, Ser: 0.94 mg/dL (ref 0.44–1.00)
GFR calc Af Amer: >60 mL/min (ref >60)
GFR calc non Af Amer: >60 mL/min (ref >60)
Glucose, Bld: 132 mg/dL — ABNORMAL HIGH (ref 70–99)
Potassium: 3 mmol/L — ABNORMAL LOW (ref 3.5–5.1)
Sodium: 139 mmol/L (ref 135–145)
Total Bilirubin: 0.8 mg/dL (ref 0.3–1.2)
Total Protein: 6.6 g/dL (ref 6.5–8.1)

## 2019-04-30 LAB — CBC WITH DIFFERENTIAL/PLATELET
Abs Immature Granulocytes: 0.77 10*3/uL — ABNORMAL HIGH (ref 0.00–0.07)
Basophils Absolute: 0.2 10*3/uL — ABNORMAL HIGH (ref 0.0–0.1)
Basophils Relative: 1 %
Eosinophils Absolute: 1.1 10*3/uL — ABNORMAL HIGH (ref 0.0–0.5)
Eosinophils Relative: 5 %
HCT: 39.1 % (ref 36.0–46.0)
Hemoglobin: 12.5 g/dL (ref 12.0–15.0)
Immature Granulocytes: 3 %
Lymphocytes Relative: 7 %
Lymphs Abs: 1.5 10*3/uL (ref 0.7–4.0)
MCH: 27.5 pg (ref 26.0–34.0)
MCHC: 32 g/dL (ref 30.0–36.0)
MCV: 85.9 fL (ref 80.0–100.0)
Monocytes Absolute: 0.6 10*3/uL (ref 0.1–1.0)
Monocytes Relative: 2 %
Neutro Abs: 18.6 10*3/uL — ABNORMAL HIGH (ref 1.7–7.7)
Neutrophils Relative %: 82 %
Platelets: 616 10*3/uL — ABNORMAL HIGH (ref 150–400)
RBC: 4.55 MIL/uL (ref 3.87–5.11)
RDW: 16.4 % — ABNORMAL HIGH (ref 11.5–15.5)
WBC: 22.7 10*3/uL — ABNORMAL HIGH (ref 4.0–10.5)
nRBC: 0.3 % — ABNORMAL HIGH (ref 0.0–0.2)

## 2019-04-30 LAB — LIPASE, BLOOD: Lipase: 18 U/L (ref 11–51)

## 2019-04-30 IMAGING — CT CT ABD-PELV W/ CM
2 of 3 series · 16 of 46 positions shown, 18 images · IV contrast (omnipaque)
Comparison: [DATE]

CLINICAL DATA: Abdominal pain, prior post partial colectomy

EXAM:
CT ABDOMEN AND PELVIS WITH CONTRAST
TECHNIQUE: Multidetector CT imaging of the abdomen and pelvis was performed
using the standard protocol following bolus administration of
intravenous contrast.
CONTRAST:  100mL OMNIPAQUE IOHEXOL 300 MG/ML  SOLN

[Series 3: a/p w/ 5mm (person_name) · axial · 0.96mm/px · z∈[+877,+1322]mm · 13 of 103 slices shown, 15 images]
[im 7/103  soft-tissue]
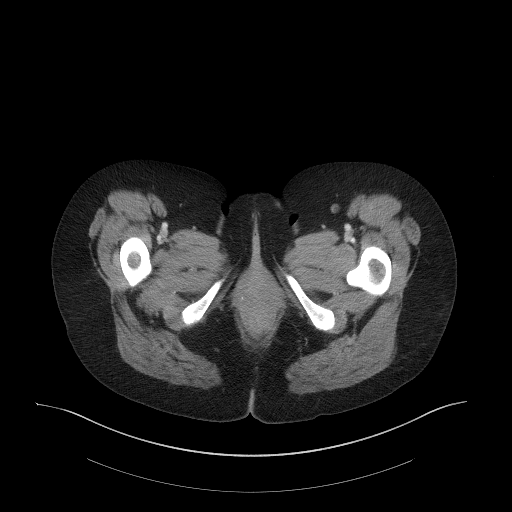
[im 7/103  bone]
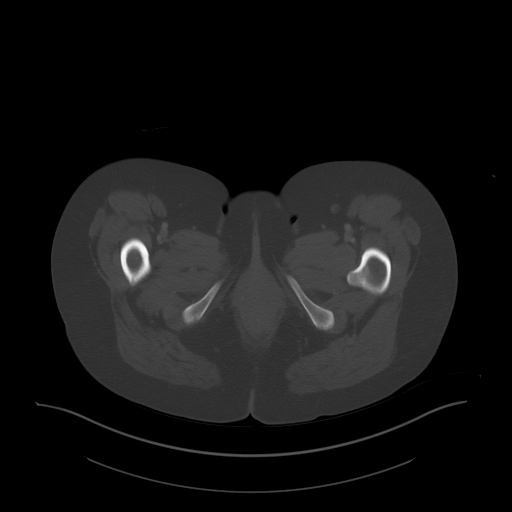
[im 14/103  soft-tissue]
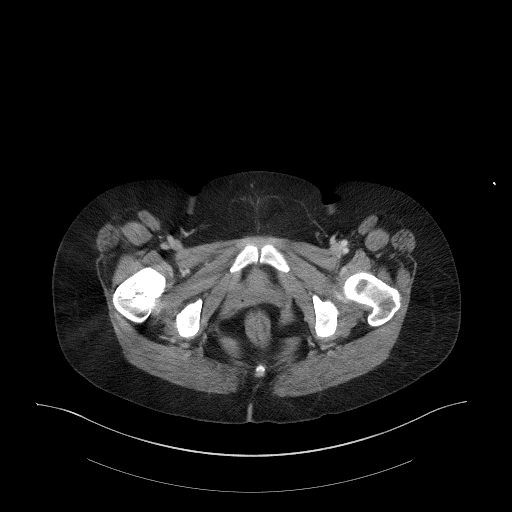
[im 20/103  soft-tissue]
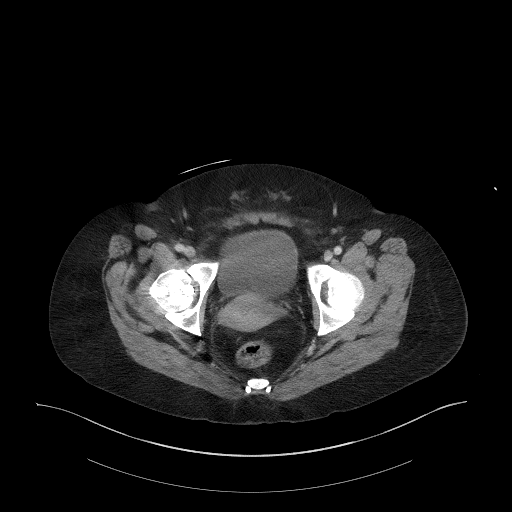
[im 30/103  soft-tissue]
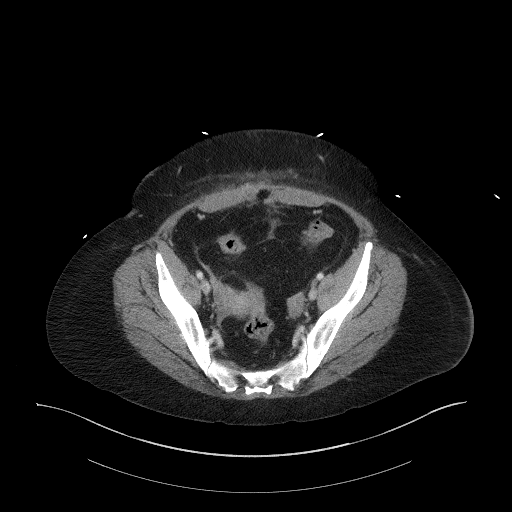
[im 37/103  soft-tissue]
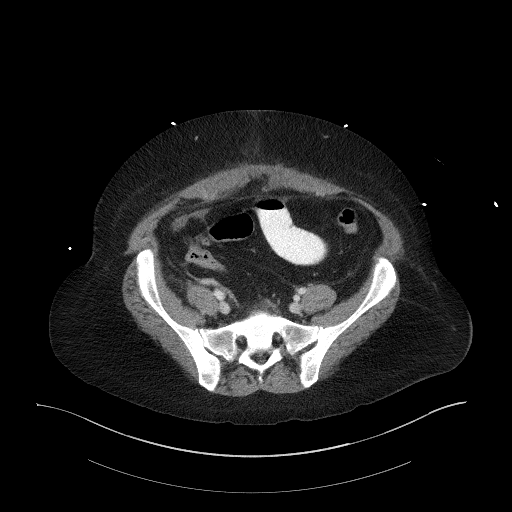
[im 43/103  soft-tissue]
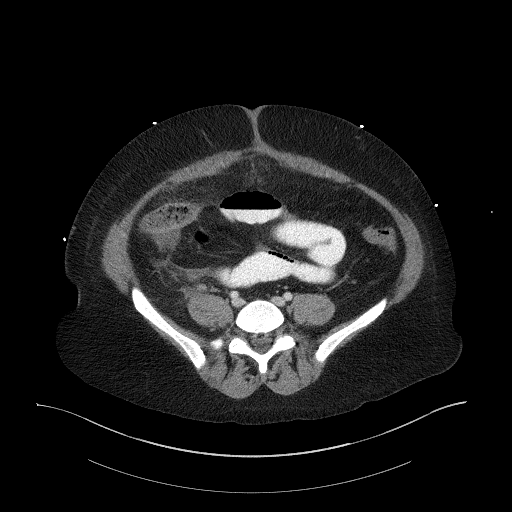
[im 53/103  soft-tissue]
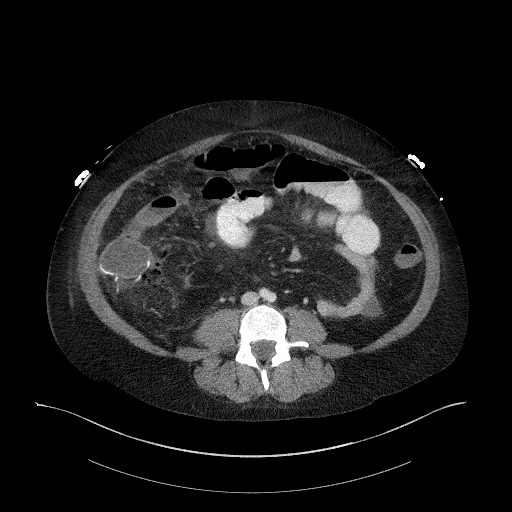
[im 60/103  soft-tissue]
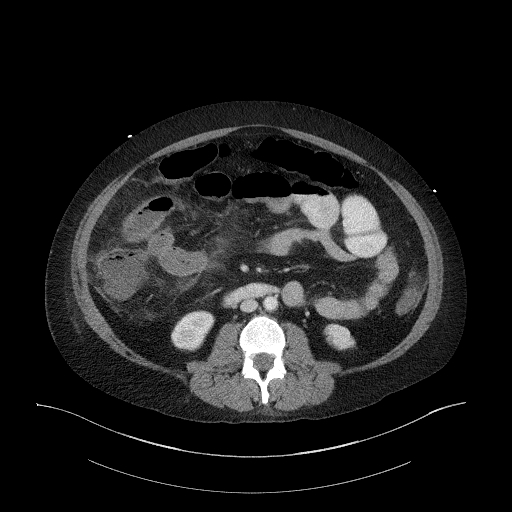
[im 66/103  soft-tissue]
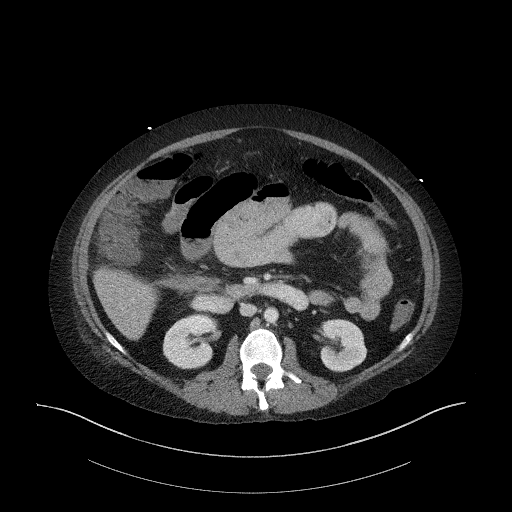
[im 66/103  bone]
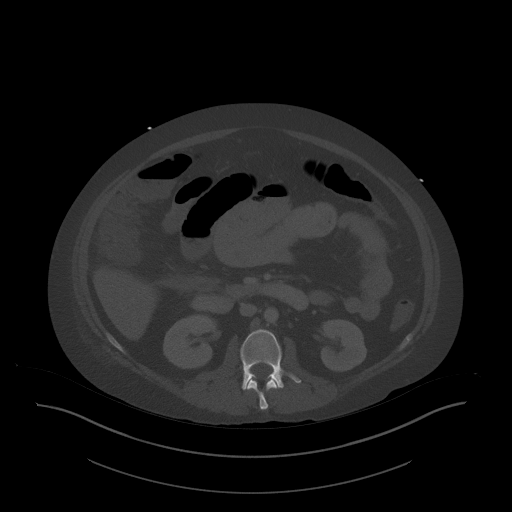
[im 73/103  soft-tissue]
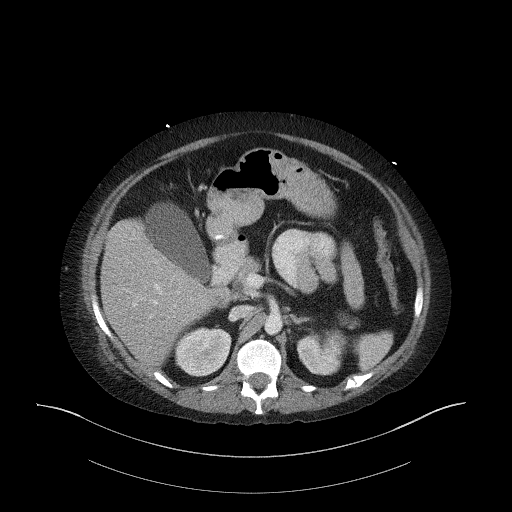
[im 83/103  soft-tissue]
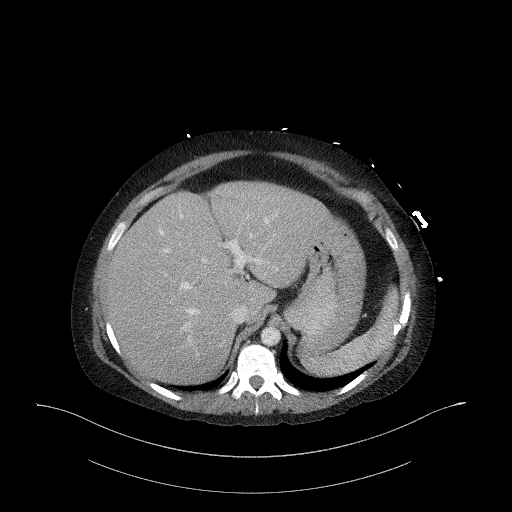
[im 89/103  soft-tissue]
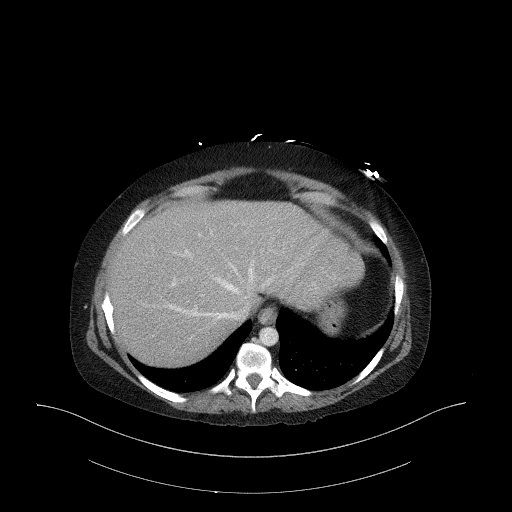
[im 96/103  soft-tissue]
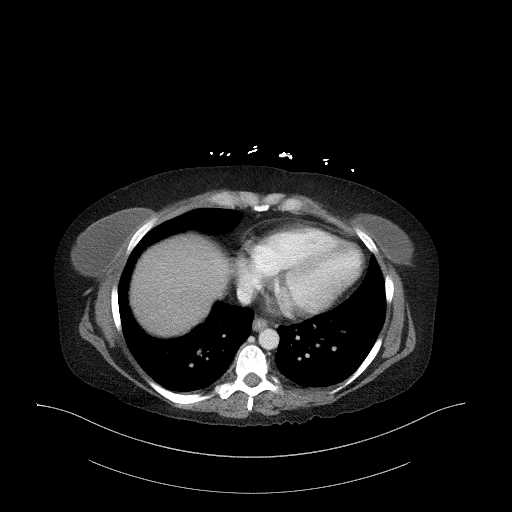

[Series 6: a/p w/ cor · coronal · 1.00mm/px · 3 of 184 slices shown]
[im 62/184  soft-tissue]
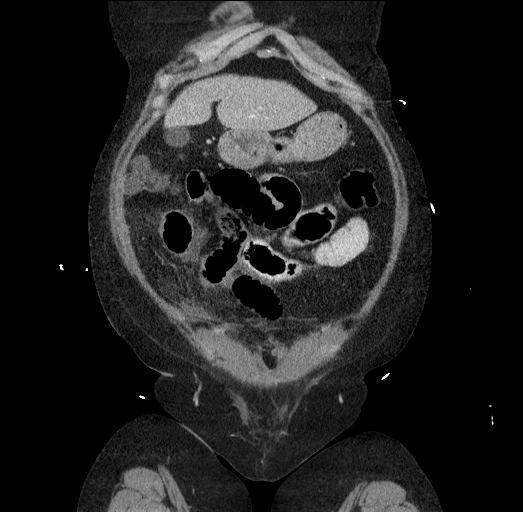
[im 82/184  soft-tissue]
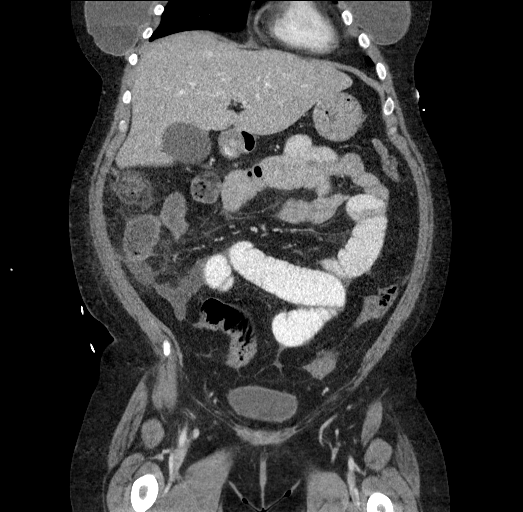
[im 102/184  soft-tissue]
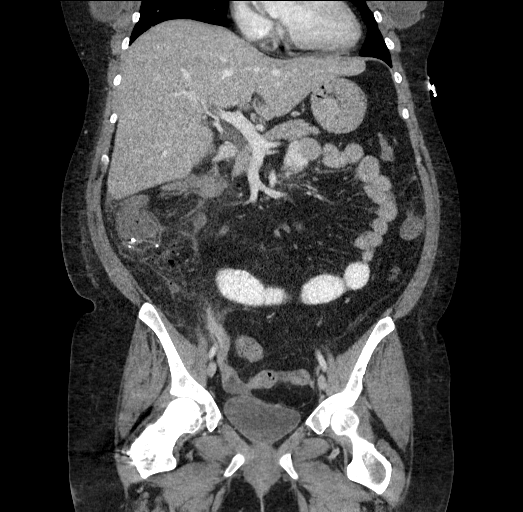

[16 of 46 positions shown; findings below may reference images not displayed]

FINDINGS: Lower chest: No acute abnormality.

Hepatobiliary: No focal liver abnormality is seen. No gallstones,
gallbladder wall thickening, or biliary dilatation.

Pancreas: Unremarkable. No pancreatic ductal dilatation or
surrounding inflammatory changes.

Spleen: Normal in size without focal abnormality.

Adrenals/Urinary Tract: Normal adrenal glands.

Stomach/Bowel: Stomach is within normal limits. Extraluminal air
seen adjacent to the ileocolic anastomosis in the right lower
quadrant with surrounding inflammatory changes. Distension of the
small bowel with air-fluid levels within bowel measuring up to
cm with concerning for partial small bowel obstruction versus ileus.
No pneumatosis. No portal venous gas.

Vascular/Lymphatic: No significant vascular findings are present. No
enlarged abdominal or pelvic lymph nodes.

Reproductive: Uterus and bilateral adnexa are unremarkable.

Other: No evidence of abdominal wall hernia. Trace pelvic free
fluid.

Musculoskeletal: No acute osseous abnormality. No aggressive osseous
lesion.
IMPRESSION: 1. Extraluminal gas seen adjacent to the ileocolic anastomosis with
surrounding inflammatory changes in the right lower quadrant
concerning for site of perforation versus possible postsurgical
changes given recent history of surgery at outside institution
([HOSPITAL]). Recommend correlation with surgical history. Distension of
the small bowel with air-fluid levels within bowel measuring up to
3.2 cm with concerning for partial small bowel obstruction versus
ileus.

Critical Value/emergent results were called by telephone at the time
of interpretation on [DATE] at [DATE] to PRATYUSHA
, who verbally acknowledged these results.

## 2019-04-30 MED ORDER — FENTANYL CITRATE (PF) 100 MCG/2ML IJ SOLN
25.0000 ug | Freq: Once | INTRAMUSCULAR | Status: AC
  Administered 2019-04-30: 25 ug via INTRAVENOUS
  Filled 2019-04-30: qty 2

## 2019-04-30 MED ORDER — FAMOTIDINE 20 MG PO TABS
20.00 | ORAL_TABLET | ORAL | Status: DC
Start: 2019-04-29 — End: 2019-04-30

## 2019-04-30 MED ORDER — NALOXONE HCL 4 MG/10ML IJ SOLN
0.40 | INTRAMUSCULAR | Status: DC
Start: ? — End: 2019-04-30

## 2019-04-30 MED ORDER — SODIUM CHLORIDE 0.9 % IV SOLN
INTRAVENOUS | Status: DC
  Administered 2019-05-01: 09:00:00 via INTRAVENOUS

## 2019-04-30 MED ORDER — FENTANYL CITRATE (PF) 100 MCG/2ML IJ SOLN
50.0000 ug | INTRAMUSCULAR | Status: DC | PRN
  Administered 2019-04-30 (×2): 50 ug via INTRAVENOUS
  Filled 2019-04-30 (×2): qty 2

## 2019-04-30 MED ORDER — HEPARIN SODIUM (PORCINE) 5000 UNIT/ML IJ SOLN
5000.00 | INTRAMUSCULAR | Status: DC
Start: 2019-04-29 — End: 2019-04-30

## 2019-04-30 MED ORDER — ACETAMINOPHEN 325 MG PO TABS
975.00 | ORAL_TABLET | ORAL | Status: DC
Start: 2019-04-29 — End: 2019-04-30

## 2019-04-30 MED ORDER — ONDANSETRON HCL 4 MG/2ML IJ SOLN
4.0000 mg | Freq: Four times a day (QID) | INTRAMUSCULAR | Status: DC | PRN
  Administered 2019-05-01 (×4): 4 mg via INTRAVENOUS
  Filled 2019-04-30 (×5): qty 2

## 2019-04-30 MED ORDER — HYDROMORPHONE HCL 1 MG/ML IJ SOLN
1.0000 mg | Freq: Once | INTRAMUSCULAR | Status: DC
  Filled 2019-04-30: qty 1

## 2019-04-30 MED ORDER — GENERIC EXTERNAL MEDICATION
Status: DC
Start: ? — End: 2019-04-30

## 2019-04-30 MED ORDER — HYDROMORPHONE HCL 1 MG/ML IJ SOLN
1.0000 mg | Freq: Once | INTRAMUSCULAR | Status: AC
  Administered 2019-04-30: 1 mg via INTRAVENOUS
  Filled 2019-04-30: qty 1

## 2019-04-30 MED ORDER — LACTATED RINGERS IV SOLN
10.00 | INTRAVENOUS | Status: DC
Start: ? — End: 2019-04-30

## 2019-04-30 MED ORDER — GENERIC EXTERNAL MEDICATION
1.00 | Status: DC
Start: 2019-04-29 — End: 2019-04-30

## 2019-04-30 MED ORDER — HYDROXYZINE HCL 25 MG PO TABS
25.00 | ORAL_TABLET | ORAL | Status: DC
Start: ? — End: 2019-04-30

## 2019-04-30 MED ORDER — ONDANSETRON HCL 4 MG/2ML IJ SOLN
4.0000 mg | Freq: Once | INTRAMUSCULAR | Status: AC
  Administered 2019-04-30: 4 mg via INTRAVENOUS
  Filled 2019-04-30: qty 2

## 2019-04-30 MED ORDER — SODIUM CHLORIDE 0.9 % IV BOLUS
1000.0000 mL | Freq: Once | INTRAVENOUS | Status: AC
  Administered 2019-04-30: 1000 mL via INTRAVENOUS

## 2019-04-30 MED ORDER — IOHEXOL 300 MG/ML  SOLN
100.0000 mL | Freq: Once | INTRAMUSCULAR | Status: AC | PRN
  Administered 2019-04-30: 100 mL via INTRAVENOUS

## 2019-04-30 MED ORDER — CELECOXIB 200 MG PO CAPS
200.00 | ORAL_CAPSULE | ORAL | Status: DC
Start: 2019-04-30 — End: 2019-04-30

## 2019-04-30 MED ORDER — HYDROMORPHONE HCL 1 MG/ML IJ SOLN
1.0000 mg | INTRAMUSCULAR | Status: DC | PRN
  Administered 2019-04-30 – 2019-05-01 (×6): 1 mg via INTRAVENOUS
  Filled 2019-04-30 (×6): qty 1

## 2019-04-30 MED ORDER — PANTOPRAZOLE SODIUM 40 MG PO TBEC
40.00 | DELAYED_RELEASE_TABLET | ORAL | Status: DC
Start: 2019-04-30 — End: 2019-04-30

## 2019-04-30 MED ORDER — GENERIC EXTERNAL MEDICATION
90.00 | Status: DC
Start: 2019-04-30 — End: 2019-04-30

## 2019-04-30 MED ORDER — GABAPENTIN 400 MG PO CAPS
800.00 | ORAL_CAPSULE | ORAL | Status: DC
Start: 2019-04-29 — End: 2019-04-30

## 2019-04-30 MED ORDER — GENERIC EXTERNAL MEDICATION
1.00 | Status: DC
Start: ? — End: 2019-04-30

## 2019-04-30 MED ORDER — HYDROMORPHONE HCL 1 MG/ML IJ SOLN
1.0000 mg | Freq: Once | INTRAMUSCULAR | Status: AC
  Administered 2019-04-30: 1 mg via INTRAVENOUS

## 2019-04-30 NOTE — ED Triage Notes (Signed)
Pt arrives via EMS from home with reports of lower abd pain. Pt was discharged from [facility] yesterday but states her pain was not controlled before leaving. Pt had colectomy.
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
## 2019-04-30 NOTE — ED Provider Notes (Signed)
Care assumed from Dr. Vanita Panda at shift change.  Patient with history of Crohn's disease 4 days status post hemicolectomy performed at [facility]  Patient went home yesterday, then was brought here today complaining of abdominal pain.  She was initially seen by Dr. Vanita Panda who ordered a CT scan of the abdomen and blood work.  She has a white count of 23,000 and a CT scan that shows extraluminal gas adjacent to the ileocolic anastomosis with surrounding inflammatory changes.  This is concerning for a site perforation versus possible post surgical changes.  No bed has become available at [facility].  I have called to check on bed status.  Apparently they continue to have no beds.  The case was discussed again with the patient's surgeon, Dr. Fuller Canada who does not feel as though the patient requires emergent surgical intervention, but will accept the patient when a bed becomes available.  I was told by the transfer coordinator that they do not keep a wait list and that we would have to call back intermittently and check on an available bed.  Patient will remain here through the night and we will frequently call to inquire about bed status.   Veryl Speak, MD 04/30/19 2115

## 2019-04-30 NOTE — ED Notes (Signed)
Walked pt to the bathroom

## 2019-04-30 NOTE — ED Notes (Signed)
Pt is waiting on transfer to [facility] Phone number for the transfer center is [phone]  Previous shift RN advised that we are to call to check on bed status in am.  At last check no bed was available.

## 2019-04-30 NOTE — ED Notes (Signed)
Patient transported to CT 

## 2019-05-01 ENCOUNTER — Emergency Department (HOSPITAL_COMMUNITY): Payer: Medicaid Other

## 2019-05-01 LAB — CBC WITH DIFFERENTIAL/PLATELET
Abs Immature Granulocytes: 0.98 10*3/uL — ABNORMAL HIGH (ref 0.00–0.07)
Basophils Absolute: 0.1 10*3/uL (ref 0.0–0.1)
Basophils Relative: 0 %
Eosinophils Absolute: 0.4 10*3/uL (ref 0.0–0.5)
Eosinophils Relative: 1 %
HCT: 33.2 % — ABNORMAL LOW (ref 36.0–46.0)
Hemoglobin: 11.2 g/dL — ABNORMAL LOW (ref 12.0–15.0)
Immature Granulocytes: 4 %
Lymphocytes Relative: 8 %
Lymphs Abs: 2.1 10*3/uL (ref 0.7–4.0)
MCH: 28.1 pg (ref 26.0–34.0)
MCHC: 33.7 g/dL (ref 30.0–36.0)
MCV: 83.2 fL (ref 80.0–100.0)
Monocytes Absolute: 0.9 10*3/uL (ref 0.1–1.0)
Monocytes Relative: 4 %
Neutro Abs: 20.7 10*3/uL — ABNORMAL HIGH (ref 1.7–7.7)
Neutrophils Relative %: 83 %
Platelets: 529 10*3/uL — ABNORMAL HIGH (ref 150–400)
RBC: 3.99 MIL/uL (ref 3.87–5.11)
RDW: 16 % — ABNORMAL HIGH (ref 11.5–15.5)
WBC: 25.1 10*3/uL — ABNORMAL HIGH (ref 4.0–10.5)
nRBC: 0.4 % — ABNORMAL HIGH (ref 0.0–0.2)

## 2019-05-01 LAB — BASIC METABOLIC PANEL
Anion gap: 11 (ref 5–15)
BUN: 6 mg/dL (ref 6–20)
CO2: 19 mmol/L — ABNORMAL LOW (ref 22–32)
Calcium: 8.3 mg/dL — ABNORMAL LOW (ref 8.9–10.3)
Chloride: 108 mmol/L (ref 98–111)
Creatinine, Ser: 0.8 mg/dL (ref 0.44–1.00)
GFR calc Af Amer: >60 mL/min (ref >60)
GFR calc non Af Amer: >60 mL/min (ref >60)
Glucose, Bld: 135 mg/dL — ABNORMAL HIGH (ref 70–99)
Potassium: 2.9 mmol/L — ABNORMAL LOW (ref 3.5–5.1)
Sodium: 138 mmol/L (ref 135–145)

## 2019-05-01 LAB — MAGNESIUM: Magnesium: 1.7 mg/dL (ref 1.7–2.4)

## 2019-05-01 LAB — LACTIC ACID, PLASMA: Lactic Acid, Venous: 0.9 mmol/L (ref 0.5–1.9)

## 2019-05-01 LAB — SARS CORONAVIRUS 2 (TAT 6-24 HRS): SARS Coronavirus 2: NEGATIVE

## 2019-05-01 IMAGING — DX DG ABDOMEN 1V
1 series · 1 of 1 positions shown · non-contrast
Comparison: Abdominal CT yesterday.

CLINICAL DATA: NG tube placement.

EXAM:
ABDOMEN - 1 VIEW

[abdomen]
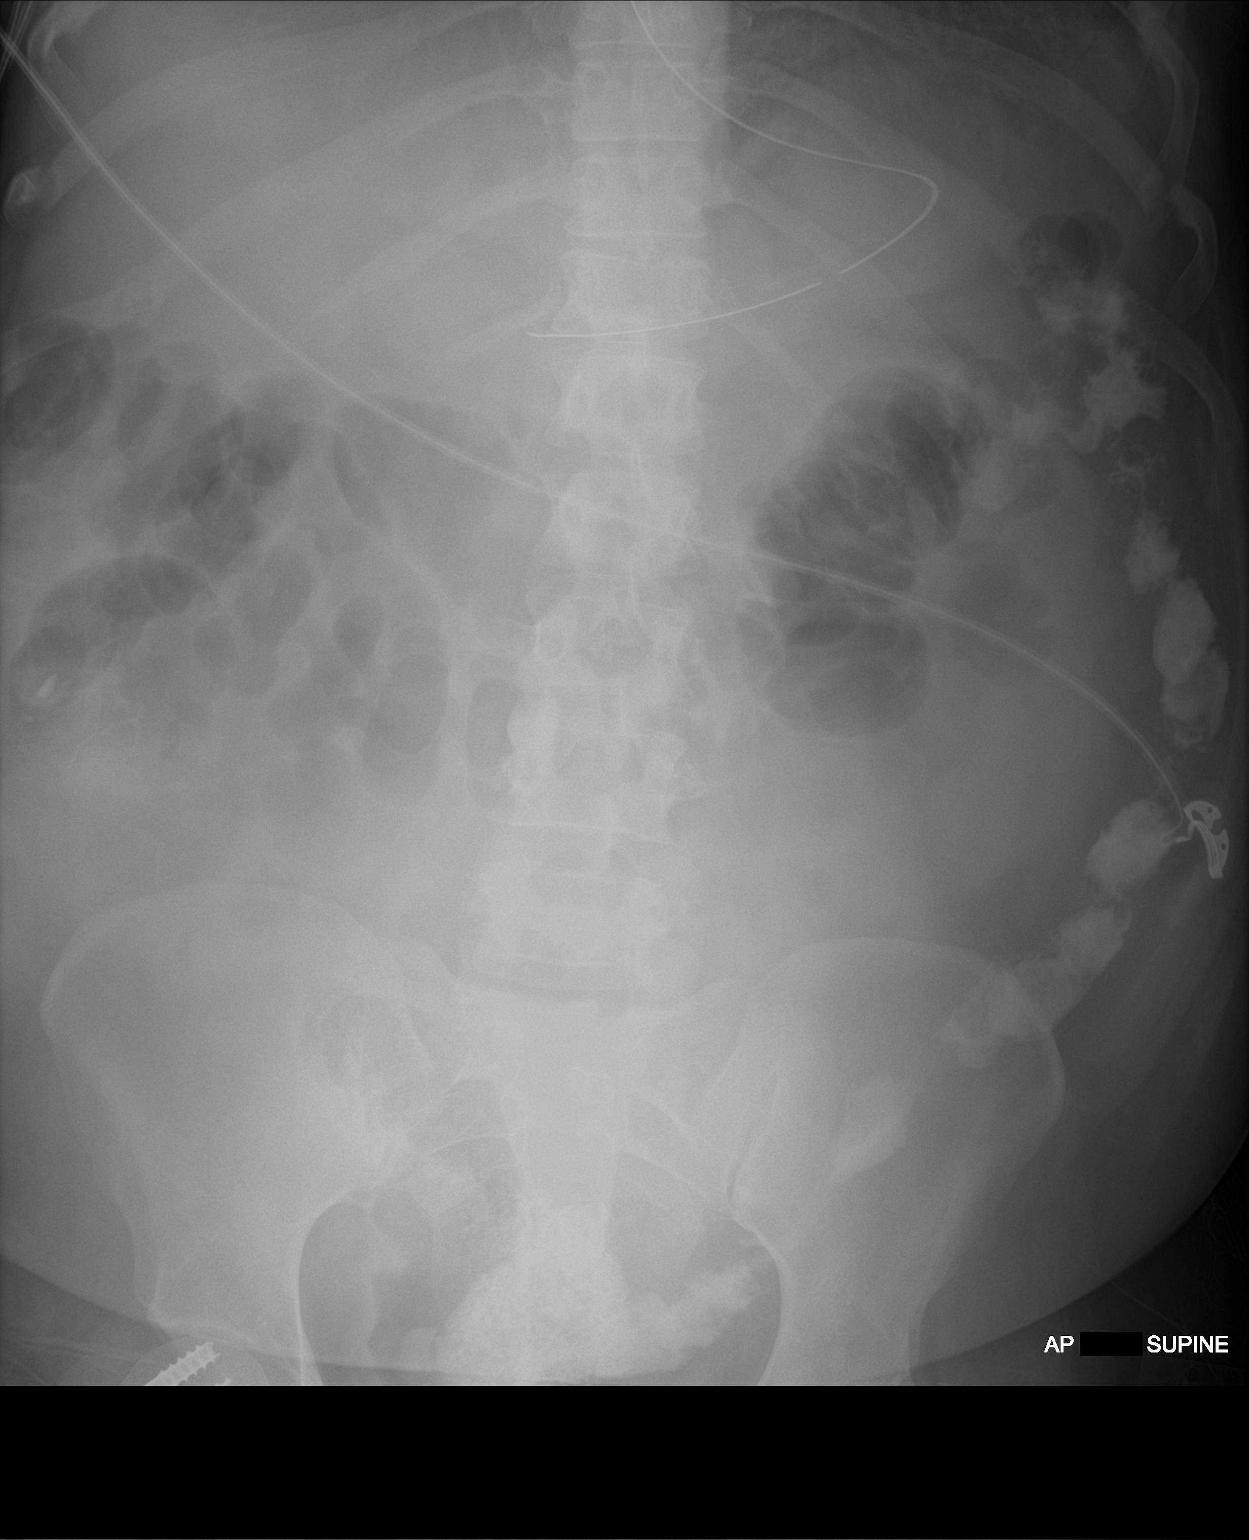

[1 of 1 positions shown; findings below may reference images not displayed]

FINDINGS: Tip and side port of the enteric tube below the diaphragm in the
stomach. Enteric contrast in the distal colon from prior CT. Mild
gaseous distention of bowel in the right abdomen. Extraluminal air
prior CT not well-defined.
IMPRESSION: Tip and side port of the enteric tube below the diaphragm in the
stomach. Enteric contrast within the colon from yesterday's CT.

## 2019-05-01 MED ORDER — POTASSIUM CHLORIDE IN NACL 40-0.9 MEQ/L-% IV SOLN
INTRAVENOUS | Status: DC
  Administered 2019-05-01 (×2): 100 mL/h via INTRAVENOUS
  Filled 2019-05-01 (×2): qty 1000

## 2019-05-01 MED ORDER — METRONIDAZOLE IN NACL 5-0.79 MG/ML-% IV SOLN
500.0000 mg | Freq: Three times a day (TID) | INTRAVENOUS | Status: DC
  Administered 2019-05-01 – 2019-05-02 (×5): 500 mg via INTRAVENOUS
  Filled 2019-05-01 (×5): qty 100

## 2019-05-01 MED ORDER — METHOCARBAMOL 1000 MG/10ML IJ SOLN
500.0000 mg | Freq: Four times a day (QID) | INTRAVENOUS | Status: DC | PRN
  Filled 2019-05-01: qty 5

## 2019-05-01 MED ORDER — GUAIFENESIN 100 MG/5ML PO SOLN
5.0000 mL | ORAL | Status: DC | PRN
  Filled 2019-05-01: qty 5

## 2019-05-01 MED ORDER — SODIUM CHLORIDE 0.9 % IV SOLN
Freq: Once | INTRAVENOUS | Status: AC
  Administered 2019-05-01: 02:00:00 via INTRAVENOUS

## 2019-05-01 MED ORDER — ACETAMINOPHEN 10 MG/ML IV SOLN
1000.0000 mg | Freq: Four times a day (QID) | INTRAVENOUS | Status: AC
  Administered 2019-05-01 – 2019-05-02 (×3): 1000 mg via INTRAVENOUS
  Filled 2019-05-01 (×4): qty 100

## 2019-05-01 MED ORDER — METRONIDAZOLE IN NACL 5-0.79 MG/ML-% IV SOLN: 500.0000 mg | Freq: Three times a day (TID) | INTRAVENOUS | Status: DC

## 2019-05-01 MED ORDER — SODIUM CHLORIDE 0.9 % IV BOLUS
1000.0000 mL | Freq: Once | INTRAVENOUS | Status: AC
  Administered 2019-05-01: 1000 mL via INTRAVENOUS

## 2019-05-01 MED ORDER — POTASSIUM CHLORIDE 10 MEQ/100ML IV SOLN
10.0000 meq | INTRAVENOUS | Status: AC
  Administered 2019-05-01 (×3): 10 meq via INTRAVENOUS
  Filled 2019-05-01 (×3): qty 100

## 2019-05-01 MED ORDER — POTASSIUM CHLORIDE 10 MEQ/100ML IV SOLN
10.0000 meq | INTRAVENOUS | Status: AC
  Administered 2019-05-01: 10 meq via INTRAVENOUS
  Filled 2019-05-01: qty 100

## 2019-05-01 MED ORDER — OXYCODONE HCL 5 MG PO TABS
10.0000 mg | ORAL_TABLET | ORAL | Status: DC | PRN
  Administered 2019-05-01: 10 mg via ORAL
  Filled 2019-05-01: qty 2

## 2019-05-01 MED ORDER — LORAZEPAM 2 MG/ML IJ SOLN
1.0000 mg | Freq: Once | INTRAMUSCULAR | Status: AC
  Administered 2019-05-01: 1 mg via INTRAVENOUS
  Filled 2019-05-01: qty 1

## 2019-05-01 MED ORDER — ACETAMINOPHEN 325 MG PO TABS: 650.0000 mg | ORAL_TABLET | Freq: Four times a day (QID) | ORAL | Status: DC | PRN

## 2019-05-01 MED ORDER — METRONIDAZOLE IN NACL 5-0.79 MG/ML-% IV SOLN
500.0000 mg | Freq: Four times a day (QID) | INTRAVENOUS | Status: DC
  Administered 2019-05-01 (×2): 500 mg via INTRAVENOUS
  Filled 2019-05-01 (×2): qty 100

## 2019-05-01 MED ORDER — HYDROMORPHONE HCL 1 MG/ML IJ SOLN
1.0000 mg | INTRAMUSCULAR | Status: DC | PRN
  Administered 2019-05-01 – 2019-05-03 (×9): 1 mg via INTRAVENOUS
  Filled 2019-05-01 (×9): qty 1

## 2019-05-01 MED ORDER — HYDROMORPHONE HCL 1 MG/ML IJ SOLN: 1.0000 mg | INTRAMUSCULAR | Status: DC | PRN

## 2019-05-01 MED ORDER — CIPROFLOXACIN IN D5W 400 MG/200ML IV SOLN
400.0000 mg | Freq: Two times a day (BID) | INTRAVENOUS | Status: DC
  Administered 2019-05-01 – 2019-05-02 (×4): 400 mg via INTRAVENOUS
  Filled 2019-05-01 (×6): qty 200

## 2019-05-01 MED ORDER — CIPROFLOXACIN IN D5W 400 MG/200ML IV SOLN
400.0000 mg | Freq: Two times a day (BID) | INTRAVENOUS | Status: DC
  Administered 2019-05-01: 400 mg via INTRAVENOUS
  Filled 2019-05-01: qty 200

## 2019-05-01 NOTE — ED Notes (Signed)
Received report on pt and went to check on her.  Pt is sweaty and flushed in bed.  Checked VS (see documentation).  Pt does not have any IV fluids infusing at this time. Will start IV fluids per MAR.  Called and spoke with our ER MD Dr. Roxanne Mins regarding pt status and possibly adding additional orders given the delay in pt getting a bed at [facility]. Pt has been moved to a hospital bed for comfort and placed the pt on the monitor to be able to more closely monitor her.
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
## 2019-05-01 NOTE — ED Notes (Signed)
Call to [facility] transfer center for update on bed assignment. This pt was not accepted by provide Dr Launa Flight.  There is no wait list at [facility] and she is not accepted there and they do not have plan to accept pt as they are at capacity and the case was closed out.   If [facility] would like to re-initiate the transfer we can call back after mid morning. but Linus Orn from the transfer center does not anticipate any changes.

## 2019-05-01 NOTE — ED Notes (Signed)
Surgery here to see pt.  Pt continues to hold for a bed at [facility].

## 2019-05-01 NOTE — Progress Notes (Addendum)
Called [facility] Transfer line again and there are still no beds available for transfer.   Rechecked patient. Abdominal pain about the same, no worse. Nausea resolved with NG tube. Still tachycardic ~110bpm, BP stable and otherwise VS stable. No peritonitis on exam.  I also spoke with Dr. Launa Flight. He again agreed to accept patient when a bed is available. He agrees with current treatment plan of bowel rest, IV antibiotics, and IVF resuscitation.  Wellington Hampshire, [facility] Surgery 05/01/2019, 3:39 PM Pager: [phone] Mon 7:00 am -11:30 AM Tues-Fri 7:00 am-4:30 pm Sat-Sun 7:00 am-11:30 am

## 2019-05-01 NOTE — ED Notes (Signed)
Admitting here to see pt 

## 2019-05-01 NOTE — ED Notes (Signed)
Current IV in left upper arm had not been infusing any fluids, appears infiltrated though, pt states that it is painful.  Checked IV and found that it is infiltrated.  Removed this IV.  Placed a new IV in left hand.  Began infusing IV fluids as ordered.  BLood drawn, will check with ED MD regarding re-evaluation of blood work

## 2019-05-01 NOTE — Progress Notes (Signed)
[facility] Progress Note     Subjective: CC-  Continues to complain of diffuse abdominal pain. No worse than last night, but no better. Continues to have nausea, no emesis. Taking dilaudid every 2 hours. Still tachycardic but BP stable. Lactic acid normalized. WBC up to 25.1. TMAX 99.4  Objective: Vital signs in last 24 hours: Temp:  [99.4 F (37.4 C)] 99.4 F (37.4 C) (09/23 0013) Pulse Rate:  [101-134] 105 (09/23 0415) Resp:  [14-31] 30 (09/23 0445) BP: (103-142)/(66-94) 112/78 (09/23 0445) SpO2:  [90 %-99 %] 90 % (09/23 0415)    Intake/Output from previous day: 09/22 0701 - 09/23 0700 In: 1513.3 [IV Piggyback:1513.3] Out: -  Intake/Output this shift: No intake/output data recorded.  PE: Gen:  Alert, NAD HEENT: EOM's intact, pupils equal and round Card:  Slightly tachy ~106bpm Pulm:  CTAB, no W/R/R, rate and effort normal Abd: soft, distended, diffusely tender with voluntary guarding, no peritonitis. Left abdominal port sites and pfannenstiel incision healing well without erythema or drainage. There is a 3x5cm skin tear on the right lower abdominal wall.   Ext:  Calves soft and nontender without edema Psych: A&Ox3  Skin: no rashes noted, warm and dry  Lab Results:  Recent Labs    04/30/19 0825 05/01/19 0055  WBC 22.7* 25.1*  HGB 12.5 11.2*  HCT 39.1 33.2*  PLT 616* 529*   BMET Recent Labs    04/30/19 0825 05/01/19 0055  NA 139 138  K 3.0* 2.9*  CL 109 108  CO2 19* 19*  GLUCOSE 132* 135*  BUN 10 6  CREATININE 0.94 0.80  CALCIUM 8.9 8.3*   PT/INR No results for input(s): LABPROT, INR in the last 72 hours. CMP     Component Value Date/Time   NA 138 05/01/2019 0055   K 2.9 (L) 05/01/2019 0055   CL 108 05/01/2019 0055   CO2 19 (L) 05/01/2019 0055   GLUCOSE 135 (H) 05/01/2019 0055   BUN 6 05/01/2019 0055   CREATININE 0.80 05/01/2019 0055   CREATININE 0.87 11/05/2013 1244   CALCIUM 8.3 (L) 05/01/2019 0055   CALCIUM (LL) 10/22/2010  0448    5.9 QNS FOR REPEAT CRITICAL RESULT CALLED TO, READ BACK BY AND VERIFIED WITH: MARTIN,M. RN AT 1235 ON 10/25/10 BY GILLESPIE,B.   PROT 6.6 04/30/2019 0825   ALBUMIN 2.9 (L) 04/30/2019 0825   AST 11 (L) 04/30/2019 0825   ALT 16 04/30/2019 0825   ALKPHOS 156 (H) 04/30/2019 0825   BILITOT 0.8 04/30/2019 0825   GFRNONAA >60 05/01/2019 0055   GFRNONAA 88 11/05/2013 1244   GFRAA >60 05/01/2019 0055   GFRAA >89 11/05/2013 1244   Lipase     Component Value Date/Time   LIPASE 18 04/30/2019 0825       Studies/Results: Ct Abdomen Pelvis W Contrast  Result Date: 04/30/2019 CLINICAL DATA:  Abdominal pain, prior post partial colectomy EXAM: CT ABDOMEN AND PELVIS WITH CONTRAST TECHNIQUE: Multidetector CT imaging of the abdomen and pelvis was performed using the standard protocol following bolus administration of intravenous contrast. CONTRAST:  193m OMNIPAQUE IOHEXOL 300 MG/ML  SOLN COMPARISON:  08/25/2018 FINDINGS: Lower chest: No acute abnormality. Hepatobiliary: No focal liver abnormality is seen. No gallstones, gallbladder wall thickening, or biliary dilatation. Pancreas: Unremarkable. No pancreatic ductal dilatation or surrounding inflammatory changes. Spleen: Normal in size without focal abnormality. Adrenals/Urinary Tract: Normal adrenal glands. Stomach/Bowel: Stomach is within normal limits. Extraluminal air seen adjacent to the ileocolic anastomosis in the right lower quadrant with surrounding  inflammatory changes. Distension of the small bowel with air-fluid levels within bowel measuring up to 3.2 cm with concerning for partial small bowel obstruction versus ileus. No pneumatosis. No portal venous gas. Vascular/Lymphatic: No significant vascular findings are present. No enlarged abdominal or pelvic lymph nodes. Reproductive: Uterus and bilateral adnexa are unremarkable. Other: No evidence of abdominal wall hernia. Trace pelvic free fluid. Musculoskeletal: No acute osseous abnormality. No  aggressive osseous lesion. IMPRESSION: 1. Extraluminal gas seen adjacent to the ileocolic anastomosis with surrounding inflammatory changes in the right lower quadrant concerning for site of perforation versus possible postsurgical changes given recent history of surgery at outside institution [facility]). Recommend correlation with surgical history. Distension of the small bowel with air-fluid levels within bowel measuring up to 3.2 cm with concerning for partial small bowel obstruction versus ileus. Critical Value/emergent results were called by telephone at the time of interpretation on 04/30/2019 at 1:38 pm to [place] , who verbally acknowledged these results. Electronically Signed   By: Kathreen Devoid   On: 04/30/2019 13:38    Anti-infectives: Anti-infectives (From admission, onward)   Start     Dose/Rate Route Frequency Ordered Stop   05/01/19 0100  ciprofloxacin (CIPRO) IVPB 400 mg     400 mg 200 mL/hr over 60 Minutes Intravenous Every 12 hours 05/01/19 0053     05/01/19 0100  metroNIDAZOLE (FLAGYL) IVPB 500 mg     500 mg 100 mL/hr over 60 Minutes Intravenous Every 6 hours 05/01/19 0053         Assessment/Plan Crohns disease 6 days S/P resection of ileocolonic anastomotic stricture 9/17 Dr. Mickie Kay at [place] scan shows likely contained anastomotic leak with associated ileus - Patient is still tender but does not need urgent surgical intervention at this time. Continue IV fluid resuscitation, broad spectrum IV antibiotics, and NPO/ bowel rest. I called [facility] transfer line again and was advised there are no beds. Recommended calling between 4pm and 6 pm for better chance of bed availability [phone]).  ID - cipro/flagyl 9/23>> FEN - IVF, NPO VTE - SCDs Foley - none Follow up - Dr. Renella Cunas   LOS: 0 days    Wellington Hampshire , [facility] Surgery 05/01/2019, 8:07 AM Pager: [phone] Mon-Thurs 7:00 am-4:30 pm Fri 7:00 am -11:30 AM Sat-Sun 7:00 am-11:30 am

## 2019-05-01 NOTE — ED Notes (Signed)
Report to [employer].  Notified her that [facility] has not accepted pt and that her Surgeon has not accepted her at [facility] and that I have notified Dr. Roxanne Mins of this.  Passed on to her that we can call [facility] transfer center in the am to see if there has been a change but at this time no bed.  Charge RN notified of pt extensive hold.

## 2019-05-01 NOTE — ED Provider Notes (Addendum)
12:50 AM   Patient pending transfer to [facility], presented with apparent anastomosis leak following partial colectomy.  She continues to require large amount of pain medication.  On exam, abdomen is distended and markedly tender diffusely.  Vital signs have been stable, although she is mildly tachycardic.  She initially had markedly elevated WBC and was hypokalemic.  She was given IV fluids and IV potassium.  We will also start on IV antibiotics given CT findings of leak at anastomotic site.  Because of delay in transfer to [facility], will ask general surgery to come and evaluate the patient. Dr. Kae Heller agrees to see the patient in consultation.   Delora Fuel, MD 53/96/72 0118   Lactate has come back normal.  Surgery consult appreciated.  Will need to continue with conservative management and await bed availability at [facility].   Delora Fuel, MD 89/79/15 0157  Patient reexamined, abdominal exam is unchanged.  Vital signs have been stable.  Still awaiting bed availability at [facility].  Case is signed out to Dr. Jeanell Sparrow.   Delora Fuel, MD 11/19/62 [phone]

## 2019-05-01 NOTE — ED Notes (Signed)
ED MD to the bedside to see pt

## 2019-05-01 NOTE — ED Notes (Signed)
Patient was given a Ice Pack.

## 2019-05-01 NOTE — ED Notes (Signed)
No Diet was  Ordered for Lunch, Pt. NPO.

## 2019-05-02 ENCOUNTER — Encounter (HOSPITAL_COMMUNITY): Payer: Self-pay | Admitting: General Practice

## 2019-05-02 ENCOUNTER — Emergency Department: Payer: Self-pay

## 2019-05-02 ENCOUNTER — Inpatient Hospital Stay (HOSPITAL_COMMUNITY): Payer: Medicaid Other

## 2019-05-02 DIAGNOSIS — Z9049 Acquired absence of other specified parts of digestive tract: Secondary | ICD-10-CM

## 2019-05-02 DIAGNOSIS — F419 Anxiety disorder, unspecified: Secondary | ICD-10-CM

## 2019-05-02 DIAGNOSIS — M797 Fibromyalgia: Secondary | ICD-10-CM

## 2019-05-02 DIAGNOSIS — R109 Unspecified abdominal pain: Secondary | ICD-10-CM

## 2019-05-02 DIAGNOSIS — I10 Essential (primary) hypertension: Secondary | ICD-10-CM

## 2019-05-02 DIAGNOSIS — K9189 Other postprocedural complications and disorders of digestive system: Secondary | ICD-10-CM

## 2019-05-02 DIAGNOSIS — K651 Peritoneal abscess: Secondary | ICD-10-CM

## 2019-05-02 DIAGNOSIS — Z880 Allergy status to penicillin: Secondary | ICD-10-CM

## 2019-05-02 DIAGNOSIS — K219 Gastro-esophageal reflux disease without esophagitis: Secondary | ICD-10-CM

## 2019-05-02 DIAGNOSIS — K5090 Crohn's disease, unspecified, without complications: Secondary | ICD-10-CM

## 2019-05-02 DIAGNOSIS — F17210 Nicotine dependence, cigarettes, uncomplicated: Secondary | ICD-10-CM

## 2019-05-02 DIAGNOSIS — Z800 Family history of malignant neoplasm of digestive organs: Secondary | ICD-10-CM

## 2019-05-02 DIAGNOSIS — K567 Ileus, unspecified: Secondary | ICD-10-CM

## 2019-05-02 DIAGNOSIS — R188 Other ascites: Secondary | ICD-10-CM

## 2019-05-02 DIAGNOSIS — Z803 Family history of malignant neoplasm of breast: Secondary | ICD-10-CM | POA: Diagnosis not present

## 2019-05-02 DIAGNOSIS — R400 Somnolence: Secondary | ICD-10-CM | POA: Diagnosis not present

## 2019-05-02 DIAGNOSIS — Z832 Family history of diseases of the blood and blood-forming organs and certain disorders involving the immune mechanism: Secondary | ICD-10-CM | POA: Diagnosis not present

## 2019-05-02 DIAGNOSIS — G8929 Other chronic pain: Secondary | ICD-10-CM | POA: Diagnosis present

## 2019-05-02 DIAGNOSIS — D899 Disorder involving the immune mechanism, unspecified: Secondary | ICD-10-CM | POA: Diagnosis present

## 2019-05-02 DIAGNOSIS — F909 Attention-deficit hyperactivity disorder, unspecified type: Secondary | ICD-10-CM | POA: Diagnosis present

## 2019-05-02 DIAGNOSIS — J45909 Unspecified asthma, uncomplicated: Secondary | ICD-10-CM | POA: Diagnosis present

## 2019-05-02 DIAGNOSIS — Z7982 Long term (current) use of aspirin: Secondary | ICD-10-CM | POA: Diagnosis not present

## 2019-05-02 DIAGNOSIS — Z79899 Other long term (current) drug therapy: Secondary | ICD-10-CM | POA: Diagnosis not present

## 2019-05-02 DIAGNOSIS — K5080 Crohn's disease of both small and large intestine without complications: Secondary | ICD-10-CM | POA: Diagnosis present

## 2019-05-02 DIAGNOSIS — E876 Hypokalemia: Secondary | ICD-10-CM | POA: Diagnosis not present

## 2019-05-02 DIAGNOSIS — Z881 Allergy status to other antibiotic agents status: Secondary | ICD-10-CM | POA: Diagnosis not present

## 2019-05-02 DIAGNOSIS — Z20828 Contact with and (suspected) exposure to other viral communicable diseases: Secondary | ICD-10-CM | POA: Diagnosis present

## 2019-05-02 DIAGNOSIS — E538 Deficiency of other specified B group vitamins: Secondary | ICD-10-CM | POA: Diagnosis present

## 2019-05-02 DIAGNOSIS — F411 Generalized anxiety disorder: Secondary | ICD-10-CM | POA: Diagnosis present

## 2019-05-02 DIAGNOSIS — Y832 Surgical operation with anastomosis, bypass or graft as the cause of abnormal reaction of the patient, or of later complication, without mention of misadventure at the time of the procedure: Secondary | ICD-10-CM | POA: Diagnosis present

## 2019-05-02 LAB — BASIC METABOLIC PANEL
Anion gap: 11 (ref 5–15)
Anion gap: 12 (ref 5–15)
BUN: 7 mg/dL (ref 6–20)
BUN: 8 mg/dL (ref 6–20)
CO2: 28 mmol/L (ref 22–32)
CO2: 27 mmol/L (ref 22–32)
Calcium: 8.2 mg/dL — ABNORMAL LOW (ref 8.9–10.3)
Calcium: 8 mg/dL — ABNORMAL LOW (ref 8.9–10.3)
Chloride: 103 mmol/L (ref 98–111)
Chloride: 102 mmol/L (ref 98–111)
Creatinine, Ser: 0.88 mg/dL (ref 0.44–1.00)
Creatinine, Ser: 0.92 mg/dL (ref 0.44–1.00)
GFR calc Af Amer: >60 mL/min (ref >60)
GFR calc Af Amer: >60 mL/min (ref >60)
GFR calc non Af Amer: >60 mL/min (ref >60)
GFR calc non Af Amer: >60 mL/min (ref >60)
Glucose, Bld: 111 mg/dL — ABNORMAL HIGH (ref 70–99)
Glucose, Bld: 130 mg/dL — ABNORMAL HIGH (ref 70–99)
Potassium: 2.5 mmol/L — CL (ref 3.5–5.1)
Potassium: 2.9 mmol/L — ABNORMAL LOW (ref 3.5–5.1)
Sodium: 142 mmol/L (ref 135–145)
Sodium: 141 mmol/L (ref 135–145)

## 2019-05-02 LAB — CBC
HCT: 31.1 % — ABNORMAL LOW (ref 36.0–46.0)
Hemoglobin: 10.3 g/dL — ABNORMAL LOW (ref 12.0–15.0)
MCH: 27.4 pg (ref 26.0–34.0)
MCHC: 33.1 g/dL (ref 30.0–36.0)
MCV: 82.7 fL (ref 80.0–100.0)
Platelets: 582 10*3/uL — ABNORMAL HIGH (ref 150–400)
RBC: 3.76 MIL/uL — ABNORMAL LOW (ref 3.87–5.11)
RDW: 15.9 % — ABNORMAL HIGH (ref 11.5–15.5)
WBC: 24.7 10*3/uL — ABNORMAL HIGH (ref 4.0–10.5)
nRBC: 0.3 % — ABNORMAL HIGH (ref 0.0–0.2)

## 2019-05-02 LAB — PREALBUMIN: Prealbumin: 5.9 mg/dL — ABNORMAL LOW (ref 18–38)

## 2019-05-02 LAB — MAGNESIUM: Magnesium: 2 mg/dL (ref 1.7–2.4)

## 2019-05-02 IMAGING — DX DG ABD PORTABLE 1V
1 series · 2 of 2 positions shown · non-contrast
Comparison: [DATE], CT [DATE]

CLINICAL DATA: Abdominal pain

EXAM:
PORTABLE ABDOMEN - 1 VIEW

[Series 1: abdomen · 0.14mm/px · 2 of 2 slices shown]
[im 1/2]
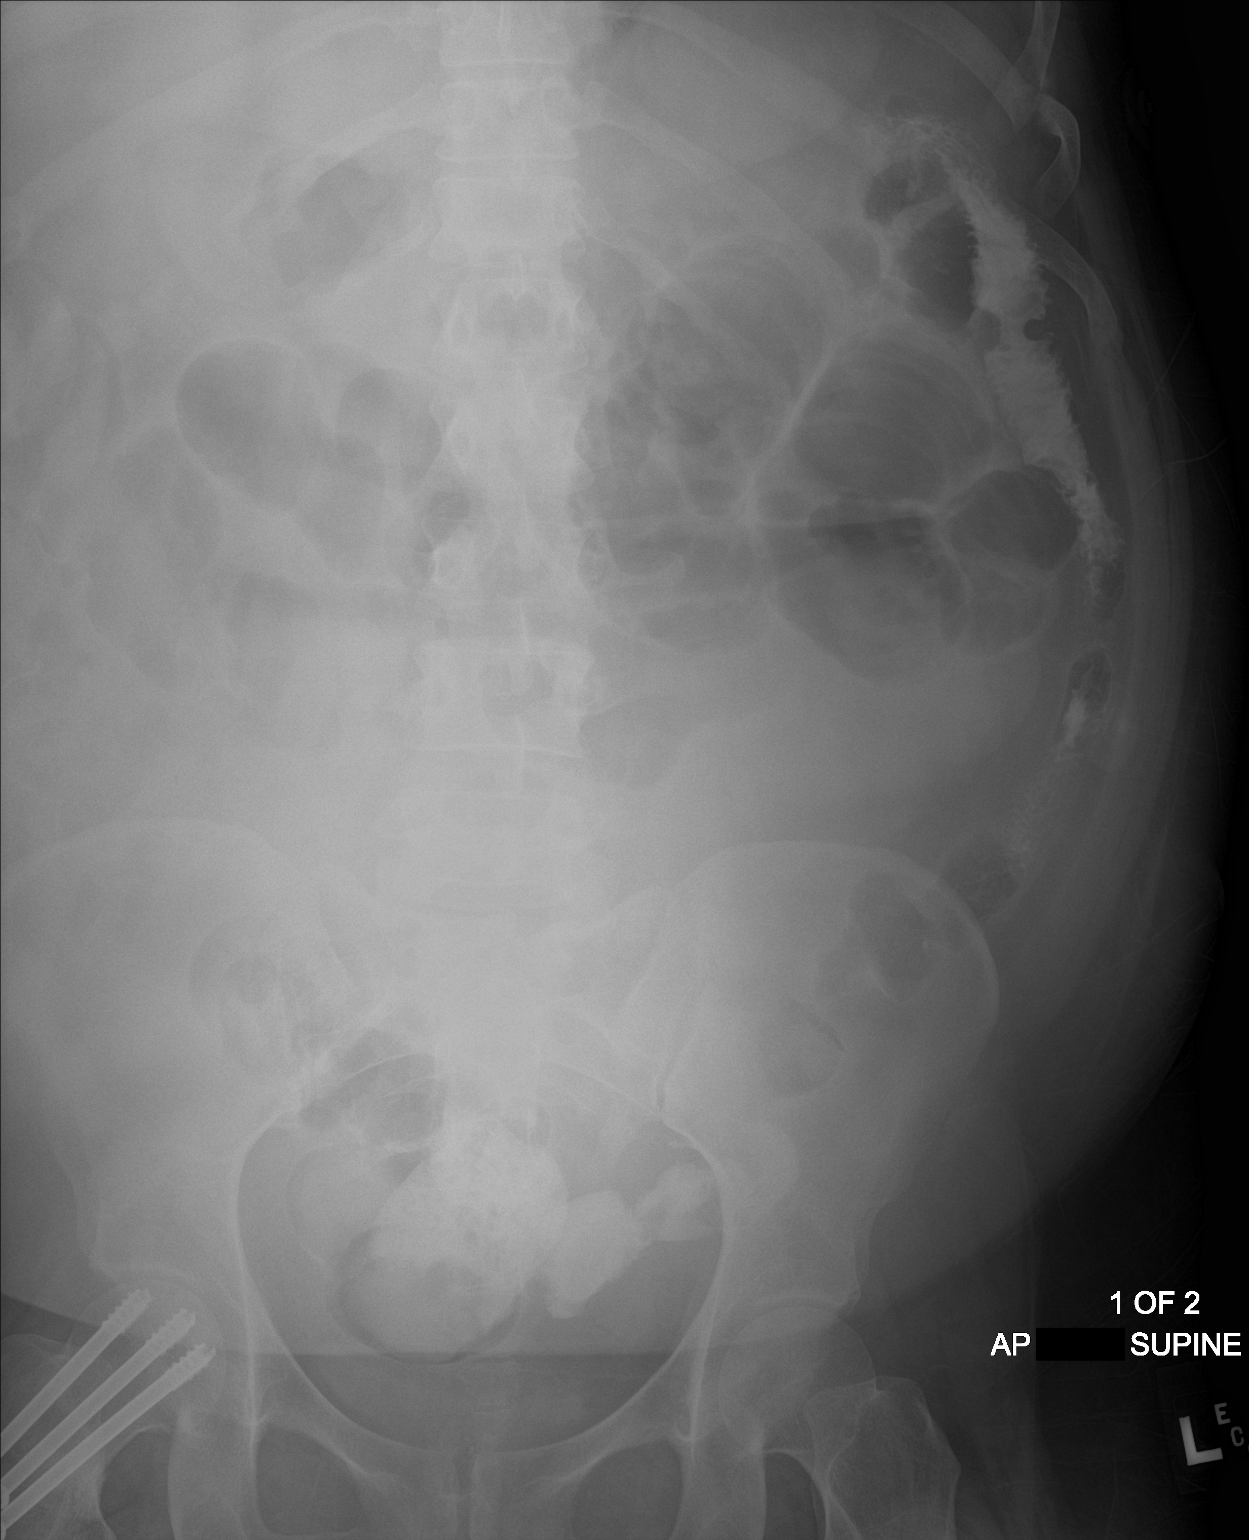
[im 2/2]
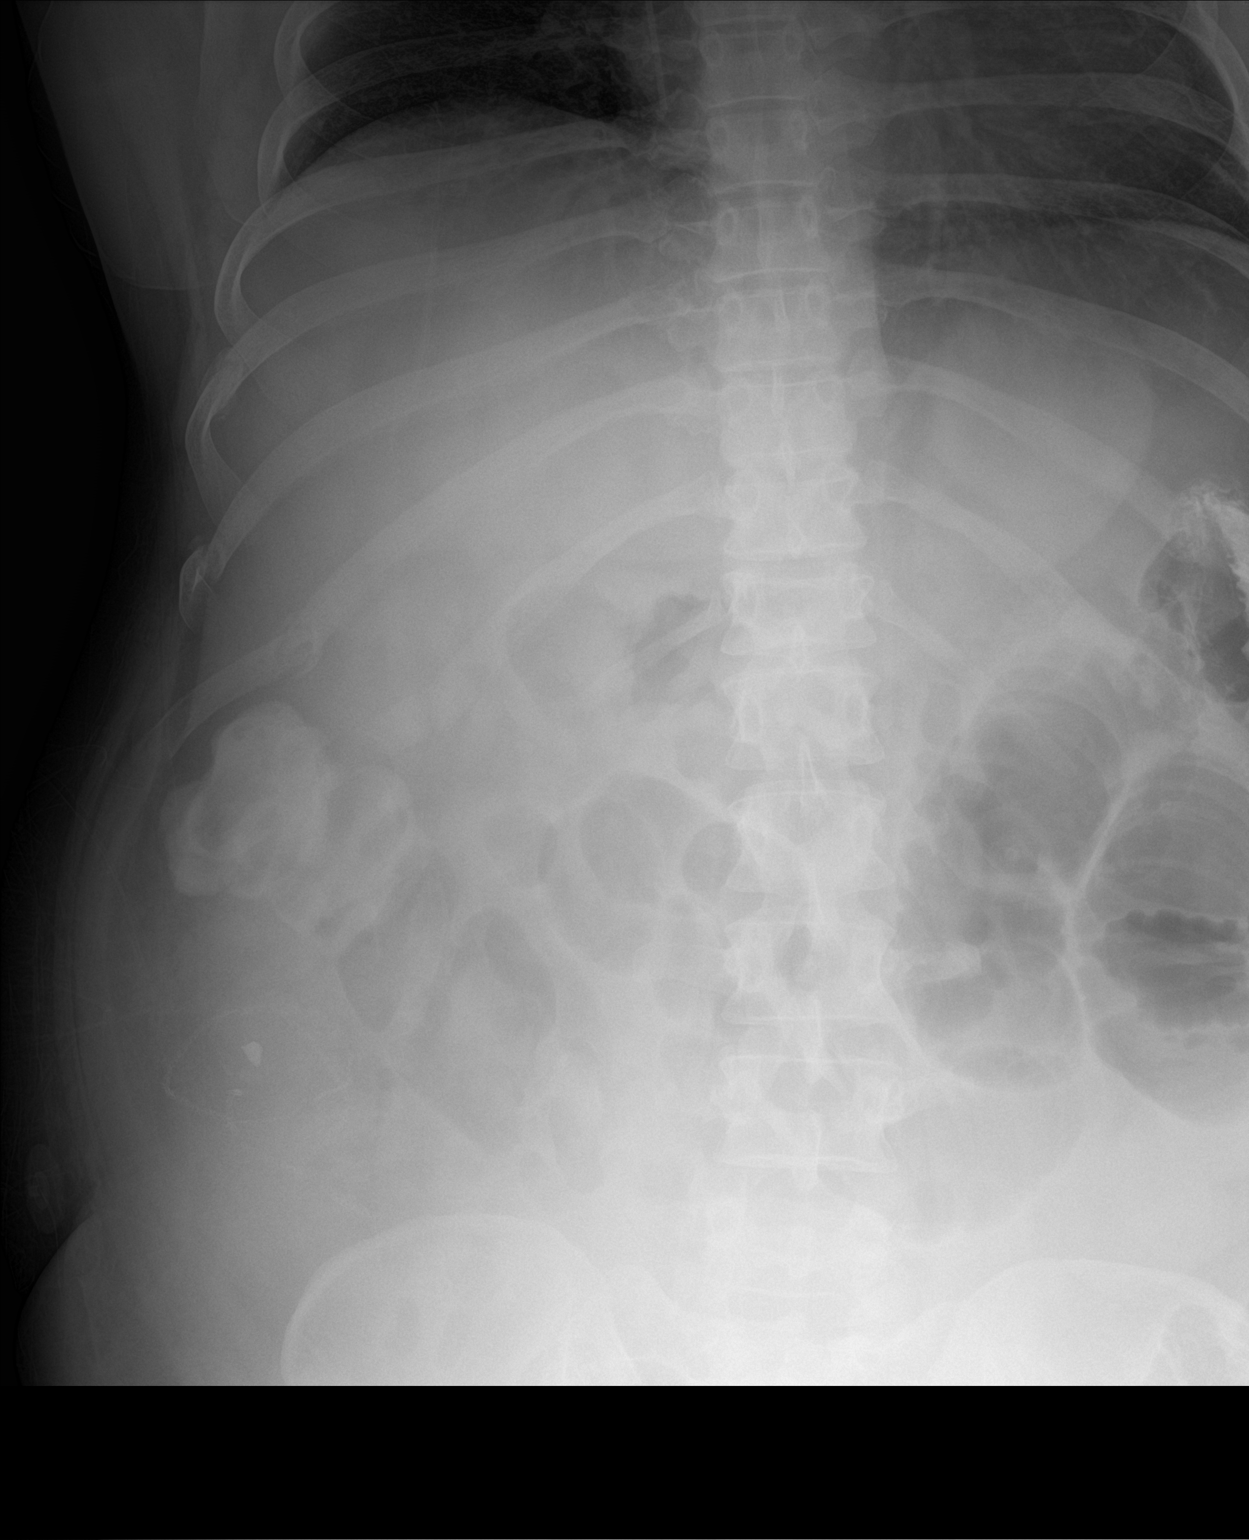

[2 of 2 positions shown; findings below may reference images not displayed]

FINDINGS: Mild gaseous distention of small bowel loops in the upper abdomen
concerning for small bowel obstruction. Oral contrast material seen
with[REDACTED]ompressed left colon. No organomegaly or visible free air.
IMPRESSION: Dilated small bowel loops in the mid and left upper abdomen
concerning for small bowel obstruction.

## 2019-05-02 MED ORDER — LORAZEPAM 2 MG/ML IJ SOLN: 0.5000 mg | Freq: Three times a day (TID) | INTRAMUSCULAR | Status: DC | PRN

## 2019-05-02 MED ORDER — LORAZEPAM 2 MG/ML IJ SOLN
0.5000 mg | Freq: Three times a day (TID) | INTRAMUSCULAR | Status: DC | PRN
  Administered 2019-05-02: 0.5 mg via INTRAVENOUS
  Filled 2019-05-02: qty 1

## 2019-05-02 MED ORDER — METHOCARBAMOL 1000 MG/10ML IJ SOLN
1000.0000 mg | Freq: Three times a day (TID) | INTRAVENOUS | Status: DC
  Administered 2019-05-02 (×2): 1000 mg via INTRAVENOUS
  Filled 2019-05-02 (×8): qty 10

## 2019-05-02 MED ORDER — ACETAMINOPHEN 10 MG/ML IV SOLN: 1000.0000 mg | Freq: Four times a day (QID) | INTRAVENOUS | Status: DC

## 2019-05-02 MED ORDER — ACETAMINOPHEN 10 MG/ML IV SOLN
1000.0000 mg | Freq: Four times a day (QID) | INTRAVENOUS | Status: DC
  Administered 2019-05-02 (×3): 1000 mg via INTRAVENOUS
  Filled 2019-05-02 (×4): qty 100

## 2019-05-02 MED ORDER — TRAVASOL 10 % IV SOLN
INTRAVENOUS | Status: DC
  Administered 2019-05-02: 18:00:00 via INTRAVENOUS
  Filled 2019-05-02: qty 576

## 2019-05-02 MED ORDER — POTASSIUM CHLORIDE 10 MEQ/100ML IV SOLN
10.0000 meq | Freq: Once | INTRAVENOUS | Status: AC
  Administered 2019-05-02: 10 meq via INTRAVENOUS
  Filled 2019-05-02: qty 100

## 2019-05-02 MED ORDER — SODIUM CHLORIDE 0.9% FLUSH: 10.0000 mL | Freq: Two times a day (BID) | INTRAVENOUS | Status: DC

## 2019-05-02 MED ORDER — SODIUM CHLORIDE 0.9% FLUSH: 10.0000 mL | INTRAVENOUS | Status: DC | PRN

## 2019-05-02 MED ORDER — POTASSIUM CHLORIDE 10 MEQ/100ML IV SOLN: 10.0000 meq | INTRAVENOUS | Status: DC

## 2019-05-02 MED ORDER — KCL IN DEXTROSE-NACL 20-5-0.45 MEQ/L-%-% IV SOLN
INTRAVENOUS | Status: DC
  Administered 2019-05-02 (×2): via INTRAVENOUS
  Filled 2019-05-02 (×3): qty 1000

## 2019-05-02 MED ORDER — METHOCARBAMOL 1000 MG/10ML IJ SOLN: 1000.0000 mg | Freq: Three times a day (TID) | INTRAVENOUS | Status: DC

## 2019-05-02 MED ORDER — ENOXAPARIN SODIUM 40 MG/0.4ML ~~LOC~~ SOLN
40.0000 mg | SUBCUTANEOUS | Status: DC
  Administered 2019-05-02: 40 mg via SUBCUTANEOUS
  Filled 2019-05-02: qty 0.4

## 2019-05-02 MED ORDER — ONDANSETRON HCL 4 MG/2ML IJ SOLN: 4.0000 mg | Freq: Four times a day (QID) | INTRAMUSCULAR | 0 refills | Status: DC | PRN

## 2019-05-02 MED ORDER — GUAIFENESIN 100 MG/5ML PO SOLN: 5.0000 mL | ORAL | 0 refills | Status: DC | PRN

## 2019-05-02 MED ORDER — LORAZEPAM 2 MG/ML IJ SOLN
1.0000 mg | Freq: Once | INTRAMUSCULAR | Status: AC
  Administered 2019-05-02: 1 mg via INTRAVENOUS
  Filled 2019-05-02: qty 1

## 2019-05-02 MED ORDER — INFLUENZA VAC SPLIT QUAD 0.5 ML IM SUSY: 0.5000 mL | PREFILLED_SYRINGE | INTRAMUSCULAR | Status: DC

## 2019-05-02 MED ORDER — KCL IN DEXTROSE-NACL 20-5-0.45 MEQ/L-%-% IV SOLN: 50.0000 mL/h | INTRAVENOUS | Status: DC

## 2019-05-02 MED ORDER — POTASSIUM CHLORIDE 10 MEQ/100ML IV SOLN
10.0000 meq | INTRAVENOUS | Status: AC
  Administered 2019-05-02 (×5): 10 meq via INTRAVENOUS
  Filled 2019-05-02 (×5): qty 100

## 2019-05-02 MED ORDER — OXYCODONE HCL 5 MG PO TABS
10.0000 mg | ORAL_TABLET | Freq: Four times a day (QID) | ORAL | Status: DC | PRN
  Administered 2019-05-02: 10 mg via ORAL
  Filled 2019-05-02: qty 2

## 2019-05-02 MED ORDER — CHLORHEXIDINE GLUCONATE CLOTH 2 % EX PADS: 6.0000 | MEDICATED_PAD | Freq: Every day | CUTANEOUS | Status: DC

## 2019-05-02 MED ORDER — CIPROFLOXACIN IN D5W 400 MG/200ML IV SOLN: 400.0000 mg | Freq: Two times a day (BID) | INTRAVENOUS | Status: DC

## 2019-05-02 MED ORDER — GENERIC EXTERNAL MEDICATION
Status: DC
Start: ? — End: 2019-05-02

## 2019-05-02 MED ORDER — MAGNESIUM SULFATE 2 GM/50ML IV SOLN
2.0000 g | Freq: Once | INTRAVENOUS | Status: AC
  Administered 2019-05-02: 2 g via INTRAVENOUS
  Filled 2019-05-02: qty 50

## 2019-05-02 MED ORDER — HYDROMORPHONE HCL 1 MG/ML IJ SOLN: 1.0000 mg | Freq: Once | INTRAMUSCULAR | 0 refills | Status: AC

## 2019-05-02 MED ORDER — METRONIDAZOLE IN NACL 5-0.79 MG/ML-% IV SOLN: 500.0000 mg | Freq: Three times a day (TID) | INTRAVENOUS | Status: DC

## 2019-05-02 NOTE — Unmapped (Signed)
TC from pt's mother requesting a call from Dr. Epifania Gore. Pt is still at an OSH, there are no beds available at [facility]. Her phone [phone]

## 2019-05-02 NOTE — Progress Notes (Signed)
Called Ludwig Lean, [facility] transport coordinator for pt transfer and had given report to Collene Schlichter, RN from [place].

## 2019-05-02 NOTE — Discharge Summary (Signed)
[facility] Discharge Summary   Patient ID: [name] MRN: [id number] DOB/AGE: June 22, 1981 38 y.o.  Admit date: 04/30/2019 Discharge date: 05/02/2019  Admitting Diagnosis: Crohns S/p resection of ileocolonic anastomotic stricture 04/25/19 Dr. Launa Flight at [facility] anastomotic leak  Ileus  Discharge Diagnosis Patient Active Problem List   Diagnosis Date Noted  . Crohn's disease ([place]) 05/02/2019  . Fracture of femoral neck, right, closed ([place]) 01/13/2019  . Subarachnoid hemorrhage ([place]) 10/20/2018  . First time seizure ([place]) 10/20/2018  . Microcytic anemia 01/23/2018  . Leukocytosis   . Tobacco abuse   . Alcohol abuse   . Polysubstance abuse ([place])   . Attention deficit hyperactivity disorder (ADHD)   . Vitamin B12 deficiency   . Fibromyalgia   . Crohn's disease with complication ([place])   . Uncomplicated asthma   . Thrombocytosis ([place])   . Mild renal insufficiency   . Hypocalcemia 12/06/2017  . Hypomagnesemia 12/06/2017  . 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 Abdominal pain 10/09/2013  . Rectal prolapse 10/09/2013  . Chronic narcotic dependence ([place]) 11/02/2011  . Abdominal pain, generalized 11/02/2011  . Hypokalemia 11/02/2011  . Radiculopathy 11/02/2011  . Bilateral shoulder pain 10/01/2011  . Cervical radiculopathy 09/30/2011  . Pain, joint, multiple sites 09/30/2011  . Lumbar radicular pain 08/04/2011  . Chronic neck pain 08/04/2011  . Endometriosis 08/04/2011  . Allergic rhinitis, cause unspecified 01/21/2011  . Chronic back pain 01/21/2011  . B12 deficiency 12/13/2010  . ECZEMA 05/20/2010  . COMMON MIGRAINE 02/05/2010  . ADHD 12/02/2009  . 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 [facility] INTESTINE 05/19/2009  . Anxiety state 03/24/2009  . Essential hypertension 03/24/2009  . GERD 03/24/2009  . HEADACHE, CHRONIC 03/24/2009    Consultants None  Imaging: Dg Abd 1 View  Result Date:  05/01/2019 CLINICAL DATA:  NG tube placement. EXAM: ABDOMEN - 1 VIEW COMPARISON:  Abdominal CT yesterday. FINDINGS: Tip and side port of the enteric tube below the diaphragm in the stomach. Enteric contrast in the distal colon from prior CT. Mild gaseous distention of bowel in the right abdomen. Extraluminal air prior CT not well-defined. IMPRESSION: Tip and side port of the enteric tube below the diaphragm in the stomach. Enteric contrast within the colon from yesterday's CT. Electronically Signed   By: Keith Rake M.D.   On: 05/01/2019 22:25   [place] Ekg Site Rite  Result Date: 05/02/2019 If Site Rite image not attached, placement could not be confirmed due to current cardiac rhythm.   Procedures None this admission  Hospital Course:  [name] is a 38yo female PMH Crohns s/p resection of ileocolonic anastomotic stricture 04/25/19 Dr. Launa Flight at [facility]. She presented to [facility] 9/22 complaining of worsening abdominal pain. CT scan was obtained and revealed a likely contained anastomotic leak with associated ileus. WBC 22.7, tachycardic but otherwise hemodynamically stable. She was tender but did not need urgent surgical intervention. Patient was admitted to the surgical service for IVF resuscitation, broad spectrum antibiotics, and NPO/bowel rest. NG tube was placed. She continued to have abdominal pain but remained hemodynamically stable. On 9/24 at bed was available for transfer to [facility]. Patient was discharged in stable condition.    Physical Exam: General:  Alert, NAD Pulm: rate and effort normal Abd:  Soft, mild distension, tender diffusely but worse in RLQ, no peritonitis Skin: warm and dry  Allergies as of 05/02/2019      Reactions   Penicillins Hives   Has patient  had a PCN reaction causing immediate rash, facial/tongue/throat swelling, SOB or lightheadedness with hypotension: Yes Has patient had a PCN reaction causing severe rash involving mucus membranes or skin necrosis: Unk Has  patient had a PCN reaction that required hospitalization: Yes Has patient had a PCN reaction occurring within the last 10 years: No If all of the above answers are "NO", then may proceed with Cephalosporin use.   Doxycycline Other (See Comments)   Reaction not recalled      Medication List    STOP taking these medications   acetaminophen 500 MG tablet Commonly known as: TYLENOL Replaced by: acetaminophen 10 MG/ML Soln   ALPRAZolam 0.5 MG tablet Commonly known as: XANAX   aspirin EC 325 MG tablet   azithromycin 250 MG tablet Commonly known as: Zithromax Z-Pak   cyanocobalamin 1000 MCG/ML injection Commonly known as: (VITAMIN B-12)   DULoxetine 30 MG capsule Commonly known as: CYMBALTA   enoxaparin 40 MG/0.4ML injection Commonly known as: Lovenox   famotidine 40 MG tablet Commonly known as: PEPCID   ferrous sulfate 325 (65 FE) MG tablet   gabapentin 800 MG tablet Commonly known as: Neurontin   hydrOXYzine 50 MG tablet Commonly known as: ATARAX/VISTARIL   magnesium oxide 400 MG tablet Commonly known as: MAG-OX   nicotine polacrilex 4 MG gum Commonly known as: NICORETTE   nicotine polacrilex 4 MG lozenge Commonly known as: COMMIT   omeprazole 40 MG capsule Commonly known as: PRILOSEC   oxyCODONE 5 MG immediate release tablet Commonly known as: Oxy IR/ROXICODONE   pantoprazole 40 MG tablet Commonly known as: PROTONIX   polyethylene glycol 17 g packet Commonly known as: MIRALAX / GLYCOLAX   potassium chloride SA 20 MEQ tablet Commonly known as: K-DUR   pramipexole 0.75 MG tablet Commonly known as: Mirapex   sennosides-docusate sodium 8.6-50 MG tablet Commonly known as: SENOKOT-S     TAKE these medications   acetaminophen 10 MG/ML Soln Commonly known as: OFIRMEV Inject 100 mLs (1,000 mg total) into the vein every 6 (six) hours. Replaces: acetaminophen 500 MG tablet   Chlorhexidine Gluconate Cloth 2 % Pads Apply 6 each topically daily.    ciprofloxacin 400 MG/200ML Soln Commonly known as: CIPRO Inject 200 mLs (400 mg total) into the vein every 12 (twelve) hours.   dextrose 5 % and 0.45 % NaCl with KCl 20 mEq/L 20-5-0.45 MEQ/L-%-% Inject 50 mL/hr into the vein continuous.   guaiFENesin 100 MG/5ML Soln Commonly known as: ROBITUSSIN Take 5 mLs (100 mg total) by mouth every 4 (four) hours as needed for cough or to loosen phlegm.   HYDROmorphone 1 MG/ML injection Commonly known as: DILAUDID Inject 1 mL (1 mg total) into the vein once for 1 dose.   LORazepam 2 MG/ML injection Commonly known as: ATIVAN Inject 0.25 mLs (0.5 mg total) into the vein 3 (three) times daily as needed for anxiety.   methocarbamol 1,000 mg in dextrose 5 % 50 mL Inject 1,000 mg into the vein every 8 (eight) hours.   metroNIDAZOLE 5-0.79 MG/ML-% IVPB Commonly known as: FLAGYL Inject 100 mLs (500 mg total) into the vein every 8 (eight) hours. Start taking on: May 03, 2019   ondansetron 4 MG/2ML Soln injection Commonly known as: ZOFRAN Inject 2 mLs (4 mg total) into the vein every 6 (six) hours as needed for nausea or vomiting.   potassium chloride 10 MEQ/100ML Inject 100 mLs (10 mEq total) into the vein every 1 hour x 6 doses.   sodium chloride flush  0.9 % Soln Commonly known as: NS 10-40 mLs by Intracatheter route every 12 (twelve) hours.          Signed: Wellington Hampshire, [facility] Surgery 05/02/2019, 4:35 PM Pager: [phone] Mon-Thurs 7:00 am-4:30 pm Fri 7:00 am -11:30 AM Sat-Sun 7:00 am-11:30 am

## 2019-05-02 NOTE — Progress Notes (Signed)
Spoke to Primary RN, re-PICC order,that PICC nurses is not in [employer] at this time.

## 2019-05-02 NOTE — Progress Notes (Signed)
Peripherally Inserted Central Catheter/Midline Placement  The IV Nurse has discussed with the patient and/or persons authorized to consent for the patient, the purpose of this procedure and the potential benefits and risks involved with this procedure.  The benefits include less needle sticks, lab draws from the catheter, and the patient may be discharged home with the catheter. Risks include, but not limited to, infection, bleeding, blood clot (thrombus formation), and puncture of an artery; nerve damage and irregular heartbeat and possibility to perform a PICC exchange if needed/ordered by physician.  Alternatives to this procedure were also discussed.  Bard Power PICC patient education guide, fact sheet on infection prevention and patient information card has been provided to patient /or left at bedside.    PICC/Midline Placement Documentation  PICC Double Lumen 05/02/19 PICC Right Brachial 40 cm 2 cm (Active)  Indication for Insertion or Continuance of Line Administration of hyperosmolar/irritating solutions (i.e. TPN, Vancomycin, etc.) 05/02/19 1500  Exposed Catheter (cm) 2 cm 05/02/19 1500  Site Assessment Clean;Dry;Intact 05/02/19 1500  Lumen #1 Status Flushed;Blood return noted 05/02/19 1500  Lumen #2 Status Flushed;Blood return noted 05/02/19 1500  Dressing Type Transparent 05/02/19 1500  Dressing Status Clean;Dry;Intact;Antimicrobial disc in place 05/02/19 1500  Dressing Change Due 05/09/19 05/02/19 1500       Jule Economy Horton 05/02/2019, 3:22 PM

## 2019-05-02 NOTE — ED Notes (Signed)
Pt took out her NG tube pt stated that she does not want it replaced

## 2019-05-02 NOTE — ED Notes (Signed)
Upon receiving report, SN noted that pt NG tube suctioning set at 120 continuous; SN adjusted to intermittent suctioning. SN rec'd report that NG suctioning cannister was emptied at 3051mL. SN informed charge nurse and MD (Dr.Wickline). Per MD, suctioning to be stopped temporarily.

## 2019-05-02 NOTE — Progress Notes (Signed)
[name] NOTE   Pharmacy Consult for TPN Indication: prolonged ileus  Patient Measurements: Height: 5\' 2"  (157.5 cm) Weight: 165 lb (74.8 kg) IBW/kg (Calculated) : 50.1 TPN AdjBW (KG): 56.3 Body mass index is 30.18 kg/m.   Assessment:  38 yo female with recent surgery at [facility]. She has a history of Chron's disease, had a partial colectomy at [facility] on 04/25/19. She presented to the [facility] ED on 9/22 with abdominal pain. New scans show she likely has an anastomotic leak with ileus. Planning to start TPN in anticipation of prolonged NPO in setting of ileus.   GI: Recent partial colectomy now with post-op ileus, NG tube to suction with 650 mL out yesterday, prealbumin 5.9 Endo: cbgs 130s Insulin requirements in the past 24 hours: ) units Lytes: K 2.9 - replacement ordered and K added to fluids Renal: SCr 0.9 Pulm: RA Cards: VSS Hepatobil: LFTs/Tbili wnl Neuro: A&O ID: WBC 24.7 on cipro/flagyl  TPN Access: PICC to be placed today TPN start date: 9/224/20 Nutritional Goals awaiting RD recommendation): kCal: Protein:  Fluid:  Current Nutrition: NPO  Plan:  -TPN at 40 mL/hr -This TPN provides 57 g of protein, 163 g of dextrose, and 22 g of lipids which provides 1006 kCals per day, meeting ~50 ml/hr % of patient needs -Electrolytes in TPN: standard, Cl: Acetate 1:1 -Add MVI, trace elements to TPN    [name] 05/02/2019,9:14 AM

## 2019-05-02 NOTE — Progress Notes (Signed)
Patient wanting to talk with MD, when asked what she wanted to tell MD , patient become irritable saying she wants something to drink, this writer explained to her that since came in with abdominal pain, it would be less likely that  They will give her something, patient then stated that she's been dealing for this for years and that she knows what she is doing then she stated that she is in so much pain. This Probation officer then verified if the pain med we are giving is not sufficient , then patient got upset and stated "I did not say that". I then stated that I will go ahead and call Md.

## 2019-05-02 NOTE — ED Provider Notes (Signed)
I was made aware of patient awaiting admission to [facility]. There is still no bed availability. She is noted to be hypokalemic, likely due to NG suctioning.  This is been halted for now.  We will give a 10 M EQ KCl IV infusion.  I discontinued the other potassium infusion I have asked general surgery to reevaluate patient for admission later today    Ripley Fraise, MD 05/02/19 [phone]

## 2019-05-02 NOTE — Progress Notes (Signed)
Subjective/Chief Complaint: Awake alert follows commands but not really talking to me today, not sure about bowel function, vitals normal   Objective: Vital signs in last 24 hours: Pulse Rate:  [94-117] 99 (09/24 0640) Resp:  [16-34] 23 (09/24 0640) BP: (97-142)/(56-89) 108/72 (09/24 0640) SpO2:  [91 %-99 %] 96 % (09/24 0640)    Intake/Output from previous day: 09/23 0701 - 09/24 0700 In: 3991.7 [I.V.:3022.9; IV Piggyback:968.8] Out: 1700 [Urine:50; Emesis/NG output:650] Intake/Output this shift: No intake/output data recorded.  General appearance: awake, not really talking much, no distress Resp: clear to auscultation bilaterally Cardio: regular rate and rhythm GI: minimally tender,few bs, incision clean, no peritonitis  Lab Results:  Recent Labs    05/01/19 0055 05/02/19 0349  WBC 25.1* 24.7*  HGB 11.2* 10.3*  HCT 33.2* 31.1*  PLT 529* 582*   BMET Recent Labs    05/01/19 0055 05/02/19 0349  NA 138 142  K 2.9* 2.5*  CL 108 103  CO2 19* 28  GLUCOSE 135* 111*  BUN 6 7  CREATININE 0.80 0.88  CALCIUM 8.3* 8.2*   PT/INR No results for input(s): LABPROT, INR in the last 72 hours. ABG No results for input(s): PHART, HCO3 in the last 72 hours.  Invalid input(s): PCO2, PO2  Studies/Results: Dg Abd 1 View  Result Date: 05/01/2019 CLINICAL DATA:  NG tube placement. EXAM: ABDOMEN - 1 VIEW COMPARISON:  Abdominal CT yesterday. FINDINGS: Tip and side port of the enteric tube below the diaphragm in the stomach. Enteric contrast in the distal colon from prior CT. Mild gaseous distention of bowel in the right abdomen. Extraluminal air prior CT not well-defined. IMPRESSION: Tip and side port of the enteric tube below the diaphragm in the stomach. Enteric contrast within the colon from yesterday's CT. Electronically Signed   By: Keith Rake M.D.   On: 05/01/2019 22:25   Ct Abdomen Pelvis W Contrast  Result Date: 04/30/2019 CLINICAL DATA:  Abdominal pain, prior  post partial colectomy EXAM: CT ABDOMEN AND PELVIS WITH CONTRAST TECHNIQUE: Multidetector CT imaging of the abdomen and pelvis was performed using the standard protocol following bolus administration of intravenous contrast. CONTRAST:  157m OMNIPAQUE IOHEXOL 300 MG/ML  SOLN COMPARISON:  08/25/2018 FINDINGS: Lower chest: No acute abnormality. Hepatobiliary: No focal liver abnormality is seen. No gallstones, gallbladder wall thickening, or biliary dilatation. Pancreas: Unremarkable. No pancreatic ductal dilatation or surrounding inflammatory changes. Spleen: Normal in size without focal abnormality. Adrenals/Urinary Tract: Normal adrenal glands. Stomach/Bowel: Stomach is within normal limits. Extraluminal air seen adjacent to the ileocolic anastomosis in the right lower quadrant with surrounding inflammatory changes. Distension of the small bowel with air-fluid levels within bowel measuring up to 3.2 cm with concerning for partial small bowel obstruction versus ileus. No pneumatosis. No portal venous gas. Vascular/Lymphatic: No significant vascular findings are present. No enlarged abdominal or pelvic lymph nodes. Reproductive: Uterus and bilateral adnexa are unremarkable. Other: No evidence of abdominal wall hernia. Trace pelvic free fluid. Musculoskeletal: No acute osseous abnormality. No aggressive osseous lesion. IMPRESSION: 1. Extraluminal gas seen adjacent to the ileocolic anastomosis with surrounding inflammatory changes in the right lower quadrant concerning for site of perforation versus possible postsurgical changes given recent history of surgery at outside institution ([facility]. Recommend correlation with surgical history. Distension of the small bowel with air-fluid levels within bowel measuring up to 3.2 cm with concerning for partial small bowel obstruction versus ileus. Critical Value/emergent results were called by telephone at the time of interpretation on 04/30/2019 at 1:38  pm to providerROBERT LOCKWOOD  , who verbally acknowledged these results. Electronically Signed   By: Kathreen Devoid   On: 04/30/2019 13:38   [place] Ekg Site Rite  Result Date: 05/02/2019 If Site Rite image not attached, placement could not be confirmed due to current cardiac rhythm.   Anti-infectives: Anti-infectives (From admission, onward)   Start     Dose/Rate Route Frequency Ordered Stop   05/01/19 1600  metroNIDAZOLE (FLAGYL) IVPB 500 mg     500 mg 100 mL/hr over 60 Minutes Intravenous Every 8 hours 05/01/19 1020     05/01/19 0945  ciprofloxacin (CIPRO) IVPB 400 mg     400 mg 200 mL/hr over 60 Minutes Intravenous Every 12 hours 05/01/19 0941     05/01/19 0945  metroNIDAZOLE (FLAGYL) IVPB 500 mg  Status:  Discontinued     500 mg 100 mL/hr over 60 Minutes Intravenous Every 8 hours 05/01/19 0941 05/01/19 1020   05/01/19 0100  ciprofloxacin (CIPRO) IVPB 400 mg  Status:  Discontinued     400 mg 200 mL/hr over 60 Minutes Intravenous Every 12 hours 05/01/19 0053 05/01/19 0911   05/01/19 0100  metroNIDAZOLE (FLAGYL) IVPB 500 mg  Status:  Discontinued     500 mg 100 mL/hr over 60 Minutes Intravenous Every 6 hours 05/01/19 0053 05/01/19 0911      Assessment/Plan: Crohns disease POD 7 S/P resection of ileocolonic anastomotic stricture 9/17 Dr. Mickie Kay at [facility] scan shows likely contained anastomotic leak with associated ileus - Patient is still tender but does not need urgent surgical intervention at this time. Continue IV fluid resuscitation, broad spectrum IV antibiotics, and NPO/ bowel rest. I called [facility] transfer line again and was advised there are no beds. Recommended calling between 4pm and 6 pm for better chance of bed availability [phone]). -will admit today to ensure best care for patient as does not appear [facility] will be accepting their postop patient anytime soon. Ideally surgeon who operated on her would see her postop -will start tpn today, place picc, likely repeat her ct scan in next several days  -ngt  to lwis ID - cipro/flagyl 9/23>> FEN - IVF, NPO, replace potassium today and recheck VTE - SCDs, lovenox Foley - none Follow up -Hunt Oris 05/02/2019

## 2019-05-02 NOTE — ED Notes (Signed)
Consulting PA for surgery updated on pt status- run of PVC's, IV infiltration, and magnesium ordered.

## 2019-05-03 ENCOUNTER — Ambulatory Visit
Admit: 2019-05-03 | Discharge: 2019-05-06 | Disposition: A | Discharge status: Home / Self Care | Payer: MEDICAID | Source: Other Acute Inpatient Hospital | Admitting: Surgery

## 2019-05-03 DIAGNOSIS — Z9049 Acquired absence of other specified parts of digestive tract: Secondary | ICD-10-CM

## 2019-05-03 DIAGNOSIS — F17210 Nicotine dependence, cigarettes, uncomplicated: Secondary | ICD-10-CM

## 2019-05-03 DIAGNOSIS — Z880 Allergy status to penicillin: Secondary | ICD-10-CM

## 2019-05-03 DIAGNOSIS — K567 Ileus, unspecified: Secondary | ICD-10-CM

## 2019-05-03 DIAGNOSIS — I10 Essential (primary) hypertension: Secondary | ICD-10-CM

## 2019-05-03 DIAGNOSIS — K651 Peritoneal abscess: Secondary | ICD-10-CM

## 2019-05-03 DIAGNOSIS — Z800 Family history of malignant neoplasm of digestive organs: Secondary | ICD-10-CM

## 2019-05-03 DIAGNOSIS — F419 Anxiety disorder, unspecified: Secondary | ICD-10-CM

## 2019-05-03 DIAGNOSIS — K9189 Other postprocedural complications and disorders of digestive system: Secondary | ICD-10-CM

## 2019-05-03 DIAGNOSIS — R109 Unspecified abdominal pain: Secondary | ICD-10-CM

## 2019-05-03 DIAGNOSIS — M797 Fibromyalgia: Secondary | ICD-10-CM

## 2019-05-03 DIAGNOSIS — K219 Gastro-esophageal reflux disease without esophagitis: Secondary | ICD-10-CM

## 2019-05-03 DIAGNOSIS — K5090 Crohn's disease, unspecified, without complications: Secondary | ICD-10-CM

## 2019-05-03 LAB — BASIC METABOLIC PANEL
ANION GAP: 9 mmol/L (ref 7–15)
BUN / CREAT RATIO: 14
CALCIUM: 7.8 mg/dL — ABNORMAL LOW (ref 8.5–10.2)
CHLORIDE: 103 mmol/L (ref 98–107)
CO2: 26 mmol/L (ref 22.0–30.0)
CREATININE: 0.66 mg/dL (ref 0.60–1.00)
EGFR CKD-EPI AA FEMALE: >90 mL/min/{1.73_m2} (ref >=60)
EGFR CKD-EPI NON-AA FEMALE: >90 mL/min/{1.73_m2} (ref >=60)
GLUCOSE RANDOM: 109 mg/dL (ref 70–179)
POTASSIUM: 3.5 mmol/L (ref 3.5–5.0)

## 2019-05-03 LAB — CBC W/ AUTO DIFF
BASOPHILS ABSOLUTE COUNT: 0.1 10*9/L (ref 0.0–0.1)
BASOPHILS RELATIVE PERCENT: 0.3 %
EOSINOPHILS ABSOLUTE COUNT: 1.1 10*9/L — ABNORMAL HIGH (ref 0.0–0.4)
EOSINOPHILS RELATIVE PERCENT: 5.5 %
HEMATOCRIT: 31.1 % — ABNORMAL LOW (ref 36.0–46.0)
HEMOGLOBIN: 9.4 g/dL — ABNORMAL LOW (ref 12.0–16.0)
LARGE UNSTAINED CELLS: 2 % (ref 0–4)
LYMPHOCYTES ABSOLUTE COUNT: 1.9 10*9/L (ref 1.5–5.0)
LYMPHOCYTES RELATIVE PERCENT: 10 %
MEAN CORPUSCULAR HEMOGLOBIN: 26.7 pg (ref 26.0–34.0)
MEAN CORPUSCULAR VOLUME: 88.5 fL (ref 80.0–100.0)
MEAN PLATELET VOLUME: 7.3 fL (ref 7.0–10.0)
MONOCYTES ABSOLUTE COUNT: 0.7 10*9/L (ref 0.2–0.8)
MONOCYTES RELATIVE PERCENT: 3.5 %
NEUTROPHILS ABSOLUTE COUNT: 15.2 10*9/L — ABNORMAL HIGH (ref 2.0–7.5)
NEUTROPHILS RELATIVE PERCENT: 79.1 %
NUCLEATED RED BLOOD CELLS: 1 /100{WBCs} (ref <=4)
PLATELET COUNT: 629 10*9/L — ABNORMAL HIGH (ref 150–440)
RED BLOOD CELL COUNT: 3.51 10*12/L — ABNORMAL LOW (ref 4.00–5.20)
RED CELL DISTRIBUTION WIDTH: 16.3 % — ABNORMAL HIGH (ref 12.0–15.0)
WBC ADJUSTED: 19.2 10*9/L — ABNORMAL HIGH (ref 4.5–11.0)

## 2019-05-03 LAB — MAGNESIUM: Magnesium:MCnc:Pt:Ser/Plas:Qn:: 2.2

## 2019-05-03 LAB — SLIDE REVIEW

## 2019-05-03 LAB — CREATININE: Creatinine:MCnc:Pt:Ser/Plas:Qn:: 0.66

## 2019-05-03 LAB — BURR CELLS

## 2019-05-03 LAB — PHOSPHORUS: Phosphate:MCnc:Pt:Ser/Plas:Qn:: 2.9

## 2019-05-03 LAB — MEAN CORPUSCULAR HEMOGLOBIN CONC: Lab: 30.2 — ABNORMAL LOW

## 2019-05-03 NOTE — Unmapped (Signed)
Spiritual Care Consult Note  Signed: Roselyn Bering, Chaplain Resident 4:51 PM 05/03/2019     Visit Summary: I visited with Ms. Ruddock per the Consult request placed in Epic. Ms. Lynne described her feelings of despair as having lessened as of late. She was very grateful to know of our service and did not want to have a full conversation at this time. She asked for a business card and instructions on reaching out to pastoral care if she is feeling lonely and wants to talk again. Ms. Ganesh was excited to know that there is a Chaplain here 24-hours a day.    Plan of Care: Ms. Comins stated that she plans to reach out to Pastoral Care if she needs someone to come and be with her. Please page either the Unit Chaplain or On-Call Chaplain [phone]) if any needs arise.    Chaplains are available 24 hours a day.   Please contact your Unit Chaplain or the On-Call Chaplain [phone]).    Roselyn Bering  CPE Chaplain Resident, Palliative Care    Pager: [phone] - On-Call Pager: [phone]  Office: [phone]  [email or web address] [email or web address]    May 03, 2019 4:53 PM    Clinical Encounter Type  Care Provided To: Patient  Referral Source: Nurse  Type of Visit: Initial visit  On-Call Visit?: Yes  Reason for Visit: Routine spiritual support  Presenting Concern(s): Despair, Isolation    Spiritual Care Profile  Faith: Christian  Emotions: loneliness  Community: Family    Spiritual Assessment  Interventions Made: Established relationship of care and support, Compassionate presence                                  Needs: healing          Outcomes  Outcomes: Appreciated Chaplain visit, Open to continued care            Spiritual Care Plan  Spiritual Care Plan: Continued care, focusing on     ---    Note: No patient is required to meet with a chaplain and no effort is made on the part of the chaplain to indoctrinate, proselytize, or otherwise interfere in the patients' practice of their faith. The role of a chaplain is to be a trustworthy, nonjudgmental, supportive, and compassionate companion in the treatment journey as well as a resource for the practice of spirituality for each patient.     Chaplains are available 24 hours a day for all patients. If a patient is in need of spiritual or emotional support or has requested assistance with the practice of their faith / spirituality, please page the chaplain for your unit. If you cannot reach the chaplain for your unit, please page the On-Call chaplain at [phone].

## 2019-05-03 NOTE — Unmapped (Signed)
Care Management  Initial Transition Planning Assessment              General CM met with patient in pt room.  Pt/visitors were not wearing hospital provided masks for the duration of the interaction with CM.   CM was wearing hospital provided surgical mask and hospital provided eye protection.  CM was not within 6 foot of the patient/visitors during this interaction.  ( room too small)  Care Manager assessed the patient by : In person interview with patient, Medical record review, Discussion with Clinical Care team Pt admitted for small bowel obstruction and is being medically managed  Orientation Level: Oriented X4  Who provides care at home?: N/A  Reason for referral: Discharge Planning    Contact/Decision Maker  Extended Emergency Contact Information  Primary Emergency Contact: Penelope Coop [place]  Mobile Phone: [phone]  Relation: Mother  Secondary Emergency Contact: Merrie Roof  Mobile Phone: [phone]  Relation: Friend    Legal Next of Kin / Guardian / POA / Advance Directives     HCDM (patient stated preference): Sulak,Stacey - Mother - [phone]                   Patient Information  Lives with: Children, Parent  Pt has a 62 year old son  Type of Residence: Mailing Address:  [address] [place] [address]  Contacts:    Patient Phone Number:         Medical Provider(s): [facility]  Reason for Admission: Admitting Diagnosis:  SBO  Past Medical History:   has a past medical history of Anxiety, Arthritis, Back pain, Crohn disease (CMS-HCC), Eczema, Endometriosis, Fibromyalgia, GERD (gastroesophageal reflux disease), Headache(784.0), Hypertension, and Ulcerative colitis (CMS-HCC).  Past Surgical History:   has a past surgical history that includes Rectal prolapse repair; pr upper gi endoscopy,biopsy (N/A, 11/12/2013); pr colonoscopy flx dx w/collj spec when pfrmd (N/A, 11/12/2013); pr remvl colon & term ileum w/ileocolostomy (N/A, 11/13/2013); Breast reconstruction (2004); Endometrial ablation; pr colonoscopy flx dx w/collj spec when pfrmd (N/A, 02/20/2014); pr upper gi endoscopy,biopsy (N/A, 12/27/2017); pr colonoscopy flx dx w/collj spec when pfrmd (N/A, 12/27/2017); Hip surgery; pr colonoscopy flx dx w/collj spec when pfrmd (N/A, 03/15/2019); pr upper gi endoscopy,biopsy (N/A, 03/15/2019); and pr remvl colon & term ileum w/ileocolostomy (N/A, 04/25/2019).   Previous admit date: 04/25/2019    Primary Insurance- Payor: MEDICAID [place] / Plan: MEDICAID [place] ACCESS / Product Type: *No Product type* /   Secondary Insurance ??? Secondary Insurance  MEDICAID LME [facility]*  Prescription Coverage ???   Preferred Pharmacy - [id number] [place] Otto, [place] - [address]  [id number] - [place] Rough, [place] - [address]    Transportation home: Private vehicle  Level of function prior to admission: Independent        Type of Residence: Private residence        Location/Detail: ([place] )         Responsibilities/Dependents at home?: Yes (Describe)    Home Care services in place prior to admission?: No                          Currently receiving outpatient dialysis?: No       Financial Information       Need for financial assistance?: No       Social Determinants of Health  Social  Determinants of Health were addressed in provider documentation.  Please refer to patient history.    Discharge Needs Assessment  Concerns to be Addressed:      Clinical Risk Factors:      Barriers to taking medications: No    Prior overnight hospital stay or ED visit in last 90 days: Yes    Readmission Within the Last 30 Days: no previous admission in last 30 days              Equipment Needed After Discharge: none    Discharge Facility/Level of Care Needs:      Readmission  Risk of Unplanned Readmission Score: UNPLANNED READMISSION SCORE: 16%  Predictive Model Details           16% (Medium) Factors Contributing to Score   Calculated 05/03/2019 12:04 23% Diagnosis of drug abuse is present   [facility] Risk of Unplanned Readmission Model 22% Number of active Rx orders is 28     11% Latest calcium is low (7.8 mg/dL)     8% Imaging order is present in last 6 months     7% Latest hemoglobin is low (9.4 g/dL)     7% Phosphorous result is present     6% Number of hospitalizations in last year is 1     5% Diagnosis of deficiency anemia is present     Readmitted Within the Last 30 Days? (No if blank) Yes  Patient at risk for readmission?: No    Discharge Plan  Screen findings are: Care Manager reviewed the plan of the patient's care with the Multidisciplinary Team. No discharge planning needs identified at this time. Care Manager will continue to manage plan and monitor patient's progress with the team.    Expected Discharge Date:     Expected Transfer from Critical Care:              Initial Assessment complete?: Yes

## 2019-05-03 NOTE — Unmapped (Signed)
Adult Nutrition Assessment Note    Visit Type: MD Consult   Reason for Visit: Parenteral Nutrition        ASSESSMENT  HPI: 70 YOF, s/p Colectomy, Partial, With Removal Of Terminal Ileum With Ileocolostomy on 9/17. Was discharged from [facility] on 9/21. Presented to OSH on 9/22 with increasing SOB and abdominal pain. CT scan from OSH showed anastomotic leak with associated ileus. Pt transferred to [facility] on 9/25 with TPN running.     PMH includes:  Past Medical History:   Diagnosis Date   ??? Anxiety    ??? Arthritis    ??? Back pain    ??? Crohn disease (CMS-HCC)    ??? Eczema    ??? Endometriosis    ??? Fibromyalgia    ??? GERD (gastroesophageal reflux disease)    ??? Headache(784.0)    ??? Hypertension    ??? Ulcerative colitis (CMS-HCC)      Nutrition History:   When patient was discharged from [facility] on 9/21, she was tolerating a regular diet. She presented to OSH with abdominal pain and CT showed signs of an anastomotic leak with associated ileus. Pt was made NPO on admission and TPN started 9/24.     Nutritionally Pertinent Meds:   potassium phosphate 15 mmol  D5LR at 75 ml/hr until MN, then 1/2NS at 30 mL/hr  Nutritionally pertinent meds reviewed.     Labs:   Results in Past 7 Days  Result Component Current Result   Sodium 138 (05/03/2019)   Potassium 3.5 (05/03/2019)   Chloride 103 (05/03/2019)   CO2 26.0 (05/03/2019)   BUN 9 (05/03/2019)   Creatinine 0.66 (05/03/2019)   Glucose 109 (05/03/2019)   Calcium 7.8 (L) (05/03/2019)   Calcium, Ionized Arterial 4.51 (04/27/2019)   Magnesium 2.2 (05/03/2019)   Phosphorus 2.9 (05/03/2019)   Albumin Not in Time Range   Total Bilirubin Not in Time Range   Bilirubin, Direct Not in Time Range   AST Not in Time Range   ALT Not in Time Range   Alkaline Phosphatase Not in Time Range   Triglycerides Not in Time Range   Glucose, POC Not in Time Range       Skin:   Patient Lines/Drains/Airways Status    Active Wounds     Name:   Placement date:   Placement time:   Site:   Days:    Surgical Site 04/25/19 Abdomen Mid 04/25/19    1958     7              Intake and Output:  I/O       09/23 0701 - 09/24 0700 09/24 0701 - 09/25 0700 09/25 0701 - 09/26 0700    Urine (mL/kg/hr)  400 800 (1.5)    Stool   0    Total Output  400 800    Net  -400 -800           Stool Occurrence   2 x        Parenteral Access:  -- Placement Date: 05/02/2019  -- Placed by: Outside Hospital   -- PICC   Right Brachial  -- Double lumen  -- Tip located in the Right atrium per CXR on 05/03/2019    Current nutrition therapy order:   Nutrition Orders          Adult 3-in-1 TPN at 45 mL/hr starting at 09/26 0000    NPO Sips with meds; Medically necessary: NPO starting at 09/25 [phone]  TPN Formula to start at midnight:    Recent TPN Orders (Show up to 1 orders ; newest on the right.)     Start date and time 05/04/2019 0000       Adult 3-in-1 TPN [[id number]]     Order Status Active        Macro Ingredients    amino acid 15% 80 g     dextrose 180 g        Electrolytes    sodium acetate 40 mEq     sodium chloride 70 mEq     potassium phosphate 15 mmol     potassium chloride 20 mEq     magnesium sulfate dilution 8 mEq        Additives    multivitamin, adult injection 10 mL     trace elements-5 (CONCENTRATED) Cr-Cu-Mn-Se-Zn 1 mL        Medications    thiamine 200 mg        QS Base    sterile water 70.09 mL        Lipids    fat emulsion 20 % 30 g        Energy Contribution    Proteins 320 kcal     Dextrose 612 kcal     Lipids 300 kcal     Total 1,232 kcal        Electrolyte Ion Calculated Amount    Sodium 110 mEq     Potassium 42 mEq     Calcium --     Magnesium 8 mEq     Aluminum --     Phosphate 15 mmol     Chloride 90 mEq     Acetate 40 mEq        Other    Total Amino Acid 80 g     Total Amino Acid/kg 0.97 g/kg     Glucose Infusion Rate 1.52 mg/kg/min     Osmolarity (Estimated) 1,888.98     Volume 1,080 mL     Rate 45 mL/hr     Dosing Weight 82.2 kg     Infusion Site [employer] Instructions      --           Anthropometric Data:  -- Height: 157.5 cm (5' 2)   -- Last recorded weight: 82.2 kg (181 lb 3.5 oz)  -- Admission weight: 79.8 kg (175 lb 14.8 oz)  -- IBW: 49.97 kg  -- Percent IBW: 159.7 %  -- BMI: Body mass index is 33.15 kg/m??.     -- Weight changes this admission:   Last 5 Recorded Weights    05/03/19 0430 05/03/19 0610   Weight: 79.8 kg (175 lb 14.8 oz) 82.2 kg (181 lb 3.5 oz)     -- Weight history PTA:   Wt Readings from Last 10 Encounters:   05/03/19 82.2 kg (181 lb 3.5 oz)   04/25/19 84 kg (185 lb 3 oz)   04/09/19 76.8 kg (169 lb 5 oz)   03/15/19 72.6 kg (160 lb)   11/20/18 79.7 kg (175 lb 9.6 oz)   09/04/18 73 kg (160 lb 15 oz)   08/14/18 70.3 kg (155 lb)   02/27/18 63.9 kg (140 lb 12.8 oz)   02/16/18 63.4 kg (139 lb 12.4 oz)   01/19/18 62.6 kg (138 lb)       Daily Estimated Nutrient Needs:   Energy: 1603 kcals [Per [place] St-Jeor Equation using last recorded weight,  82.2 kg (05/03/19 1100)]  Protein: 91 gm [25% of kcal using last recorded weight, 82.2 kg (05/03/19 1100)]  Carbohydrate:   [ ]   Fluid:   [per MD team]      DIAGNOSIS:  Malnutrition Assessment using AND/ASPEN Clinical Characteristics:        Overall nutrition impression:   Patient meets ASPEN 2017 criteria for start of TPN as shown below:    - Motility disorder of prolonged ileus  - Initiate PN within 3-5 days in those who are nutritionally at risk and unlikely to achieve desired oral intake or EN.    GOALS:     Meet 100% of estimated nutrient needs via TPN  Weekly weights while on TPN  Transition to po as soon as medically feasible  Stabilization of electrolytes WNL    RECOMMENDATIONS AND INTERVENTIONS:   Monitor daily while on TPN  Daily BMP, magnesium, and phosphorus while on TPN until stable  Check triglycerides  Check LFTs  Weekly weights  Lipids added tonight for 3 in 1 formula   Add 200 mg thiamine to TPN.       Loel Lofty PharmD Candidate      Osmin Welz E Dequavion Follette BS Pharm - Adult TPN Service

## 2019-05-03 NOTE — Unmapped (Signed)
History and Physical    Assessment/Plan:  Mary Hawkins is a 38 y.o. female with history of Crohn's s/p ileocecetomy and Meckel diverticulectomy and rectopexy in 2015 c/b severe stenosis of anastomosis now s/p partial colectomy with terminal ileum resection and ileocolostomy on 9/17 who presents from OSH with diffuse abdominal pain and imaging findings demonstrating possible contained anastomotic leak and ileus.    -Admit to [facility]  -NPO and mIVF w TPN  -CXR to confirm PICC placement  -AM Labs  -Flagyl/Cefepime (9/23 - )  -Pain: Tylenol, Oxy, HM  -IV protonix and pepcid  -CT A/P w oral/IV contrast  -SQH    Chief Complaint: diffuse abdominal pain    History of Present Illness:  Mary Hawkins is a 38 y.o. female with a past medical history of  Crohn's s/p ileocecetomy and Meckel diverticulectomy and rectopexy in 2015 c/b severe stenosis of anastomosis now s/p partial colectomy with terminal ileum resection and ileocolostomy on 9/17.    She was recently discharged on 9/21. She presented to OSH on 9/24 with diffuse abdominal pain that progressively worsened since the day of discharge. Her nausea has been slightly increased from her baseline without emesis. She reports no bowel movement since 9/22 until today.  She was transferred to [facility] for further management. Denies fever or chills.    OSH record review:   afebrile, tachycardic as high as 130s, normotensive.   Leukocytosis with WBC 22.7.   CT A/P-dilated loops of small bowel measuring up to 3.2 cm consistent with post-operative ileus vs partial SBO and extraluminal air adjacent to ileocolic anastomosis in RLQ concern for possible perforation.        Past Medical History  Past Medical History:   Diagnosis Date   ??? Anxiety    ??? Arthritis    ??? Back pain    ??? Crohn disease (CMS-HCC)    ??? Eczema    ??? Endometriosis    ??? Fibromyalgia    ??? GERD (gastroesophageal reflux disease)    ??? Headache(784.0)    ??? Hypertension    ??? Ulcerative colitis (CMS-HCC)        Past Surgical History  Past Surgical History:   Procedure Laterality Date   ??? BREAST RECONSTRUCTION  2004   ??? ENDOMETRIAL ABLATION     ??? HIP SURGERY     ??? PR COLONOSCOPY FLX DX W/COLLJ SPEC WHEN PFRMD N/A 11/12/2013    Procedure: COLONOSCOPY, FLEXIBLE, PROXIMAL TO SPLENIC FLEXURE; DIAGNOSTIC, W/WO COLLECTION SPECIMEN BY BRUSH OR WASH;  Surgeon: Teodoro Spray, MD;  [facility]: [facility];  Service: Gastroenterology   ??? PR COLONOSCOPY FLX DX W/COLLJ SPEC WHEN PFRMD N/A 02/20/2014    Procedure: COLONOSCOPY, FLEXIBLE, PROXIMAL TO SPLENIC FLEXURE; DIAGNOSTIC, W/WO COLLECTION SPECIMEN BY BRUSH OR WASH;  Surgeon: Billie Ruddy, MD;  Location: [facility];  Service: Gastroenterology   ??? PR COLONOSCOPY FLX DX W/COLLJ SPEC WHEN PFRMD N/A 12/27/2017    Procedure: COLONOSCOPY, FLEXIBLE, PROXIMAL TO SPLENIC FLEXURE; DIAGNOSTIC, W/WO COLLECTION SPECIMEN BY BRUSH OR WASH;  Surgeon: Bronson Curb, MD;  [facility];  Service: Gastroenterology   ??? PR COLONOSCOPY FLX DX W/COLLJ SPEC WHEN PFRMD N/A 03/15/2019    Procedure: COLONOSCOPY, FLEXIBLE, PROXIMAL TO SPLENIC FLEXURE; DIAGNOSTIC, W/WO COLLECTION SPECIMEN BY BRUSH OR WASH;  Surgeon: Thurmon Fair, MD;  Location: [facility];  Service: Gastroenterology   ??? PR REMVL COLON & TERM ILEUM W/ILEOCOLOSTOMY N/A 11/13/2013    Procedure: Ileocecectomy, meckel's  diverticulectomy, mesh rectopexy;  Surgeon: Emilia Beck, MD;  Location: MAIN OR [facility];  Service: Gastrointestinal   ??? PR REMVL COLON & TERM ILEUM W/ILEOCOLOSTOMY N/A 04/25/2019    Procedure: Colectomy, Partial, With Removal Of Terminal Ileum With Ileocolostomy;  Surgeon: [name], MD;  Location: MAIN OR [place];  Service: Gastrointestinal   ??? PR UPPER GI ENDOSCOPY,BIOPSY N/A 11/12/2013    Procedure: UGI ENDOSCOPY; WITH BIOPSY, SINGLE OR MULTIPLE;  Surgeon: Teodoro Spray, MD;  [facility]: [facility];  Service: Gastroenterology   ??? PR UPPER GI ENDOSCOPY,BIOPSY N/A 12/27/2017    Procedure: UGI ENDOSCOPY; WITH BIOPSY, SINGLE OR MULTIPLE;  Surgeon: Bronson Curb, MD;  [facility];  Service: Gastroenterology   ??? PR UPPER GI ENDOSCOPY,BIOPSY N/A 03/15/2019    Procedure: UGI ENDOSCOPY; WITH BIOPSY, SINGLE OR MULTIPLE;  Surgeon: Thurmon Fair, MD;  Location: [facility];  Service: Gastroenterology   ??? RECTAL PROLAPSE REPAIR         Allergies  Penicillins and Doxycycline hcl    Medications    No current facility-administered medications on file prior to encounter.      Current Outpatient Medications on File Prior to Encounter   Medication Sig Dispense Refill   ??? acetaminophen (TYLENOL) 500 MG tablet Take 1,000 mg by mouth every six (6) hours as needed for pain.     ??? aspirin 325 MG delayed release (EC) tablet Take 325 mg by mouth daily.     ??? cyanocobalamin 1,000 mcg/mL injection Inject 1 mL (1,000 mcg total) under the skin every thirty (30) days. 4 mL 2   ??? DULoxetine (CYMBALTA) 30 MG capsule Take 3 capsules (90 mg total) by mouth daily. 90 capsule 11   ??? famotidine (PEPCID) 40 MG tablet Take 1/2 tablet (20 mg total) by mouth Two (2) times a day. 90 each 3   ??? gabapentin (NEURONTIN) 800 MG tablet Take 800 mg by mouth Four (4) times a day with a meal and nightly.      ??? hydroxyzine (ATARAX) 50 mg capsule/tablet Take 1 each (50 mg total) by mouth Three (3) times a day as needed. 90 tablet 5   ??? magnesium oxide (MAG-OX) 400 mg (241.3 mg magnesium) tablet Take 2 tablets (800 mg total) by mouth Two (2) times a day. 120 tablet 5   ??? nicotine polacrilex (NICORETTE MINI) lozenge 4 MG Apply 1 lozenge (4 mg total) to cheek every two (2) hours as needed for smoking cessation. 72 each 0   ??? nicotine polacrilex (NICORETTE) 4 MG gum Apply 1 each (4 mg total) to cheek every two (2) hours as needed for smoking cessation. 110 each 0   ??? omeprazole (PRILOSEC) 40 MG capsule Take 40 mg by mouth daily.     ??? oxyCODONE (ROXICODONE) 5 MG immediate release tablet Take 1-2 tablets every 4-6 hours as needed for pain 30 tablet 0   ??? pantoprazole (PROTONIX) 40 MG tablet Take 1 tablet (40 mg total) by mouth daily. 90 tablet 1   ??? polyethylene glycol (MIRALAX) 17 gram packet Take 17 g by mouth nightly as needed.     ??? potassium chloride (KLOR-CON) 20 MEQ CR tablet Take 1 tablet (20 mEq total) by mouth daily. 30 tablet 5   ??? pramipexole (MIRAPEX) 0.75 MG tablet Take 0.75 mg by mouth nightly.     ??? [DISCONTINUED] syringe with needle, safety (BD SAFETY-LOK DETACHABLE NEEDL) 3 mL 25 gauge x 5/8 Syrg 1 each  by Miscellaneous route every thirty (30) days. 25 Syringe 0       Family History  The patient's family history includes Cancer in her maternal grandfather; Esophageal cancer in her maternal grandfather..    Social History:  Social History     Tobacco Use   ??? Smoking status: Current Every Day Smoker     Packs/day: 0.50     Years: 15.00     Pack years: 7.50     Types: Cigarettes   ??? Smokeless tobacco: Never Used   ??? Tobacco comment: Pt smokes 10cpd, pt expressed desire to minimize tobacco use    Substance Use Topics   ??? Alcohol use: Yes     Frequency: 2-4 times a month     Drinks per session: 1 or 2     Comment: socially   ??? Drug use: No       Review of Systems  A 14 point ROS was conducted and was negative except for that noted in the HPI.    Objective:  BP 108/71  - Pulse 77  - Temp 36.7 ??C (Oral)  - Resp 18  - Ht 157.5 cm (5' 2)  - Wt 82.2 kg (181 lb 3.5 oz)  - LMP 04/24/2019  - SpO2 96%  - BMI 33.15 kg/m??     General: Laying in bed, comfortable no acute distress  Resp:  Non labored breathing on 1.5 L [place]  CV:  Normal rate.    Abd:  Obese abdomen, diffusely tender, moderately distended. No rebound, no guarding.  Extremities: No pedal edema  Skin:  WWP  Neuro:  Grossly intact  Psych:  Mood and affect appropriate and congruent      Xr Chest 1 View    Result Date: 05/03/2019  EXAM: XR CHEST 1 VIEW DATE: 05/03/2019 10:02 AM ACCESSION: [id number] UN DICTATED: 05/03/2019 10:21 AM INTERPRETATION LOCATION: [facility] CLINICAL INDICATION: 38 years old Female with PICC line ; OTHER  COMPARISON: 12/23/2017. TECHNIQUE: Portable Chest Radiograph. FINDINGS: Right PICC terminates in the right atrium. Subsegmental right lower lobe atelectasis. No pleural effusion or pneumothorax Stable cardiomediastinal silhouette. Lateral right ninth and 10th rib fractures.     Right PICC terminates in the right atrium.    Ct Body Outside Film For Continued Care    Result Date: 05/03/2019  These images were imported for continued care purposes only. They will not be interpreted and no charges will apply.

## 2019-05-04 DIAGNOSIS — M797 Fibromyalgia: Secondary | ICD-10-CM

## 2019-05-04 DIAGNOSIS — F17210 Nicotine dependence, cigarettes, uncomplicated: Secondary | ICD-10-CM

## 2019-05-04 DIAGNOSIS — Z9049 Acquired absence of other specified parts of digestive tract: Secondary | ICD-10-CM

## 2019-05-04 DIAGNOSIS — K219 Gastro-esophageal reflux disease without esophagitis: Secondary | ICD-10-CM

## 2019-05-04 DIAGNOSIS — I10 Essential (primary) hypertension: Secondary | ICD-10-CM

## 2019-05-04 DIAGNOSIS — R109 Unspecified abdominal pain: Secondary | ICD-10-CM

## 2019-05-04 DIAGNOSIS — K5090 Crohn's disease, unspecified, without complications: Secondary | ICD-10-CM

## 2019-05-04 DIAGNOSIS — F419 Anxiety disorder, unspecified: Secondary | ICD-10-CM

## 2019-05-04 DIAGNOSIS — K567 Ileus, unspecified: Secondary | ICD-10-CM

## 2019-05-04 DIAGNOSIS — Z880 Allergy status to penicillin: Secondary | ICD-10-CM

## 2019-05-04 DIAGNOSIS — Z800 Family history of malignant neoplasm of digestive organs: Secondary | ICD-10-CM

## 2019-05-04 DIAGNOSIS — K651 Peritoneal abscess: Secondary | ICD-10-CM

## 2019-05-04 DIAGNOSIS — K9189 Other postprocedural complications and disorders of digestive system: Secondary | ICD-10-CM

## 2019-05-04 LAB — EGFR CKD-EPI NON-AA FEMALE: Lab: >90

## 2019-05-04 LAB — CBC
HEMATOCRIT: 36.1 % (ref 36.0–46.0)
HEMOGLOBIN: 10.6 g/dL — ABNORMAL LOW (ref 12.0–16.0)
MEAN CORPUSCULAR HEMOGLOBIN CONC: 29.4 g/dL — ABNORMAL LOW (ref 31.0–37.0)
MEAN CORPUSCULAR HEMOGLOBIN: 26 pg (ref 26.0–34.0)
MEAN CORPUSCULAR VOLUME: 88.2 fL (ref 80.0–100.0)
MEAN PLATELET VOLUME: 7.9 fL (ref 7.0–10.0)
RED BLOOD CELL COUNT: 4.09 10*12/L (ref 4.00–5.20)
RED CELL DISTRIBUTION WIDTH: 16.7 % — ABNORMAL HIGH (ref 12.0–15.0)
WBC ADJUSTED: 20.6 10*9/L — ABNORMAL HIGH (ref 4.5–11.0)

## 2019-05-04 LAB — BASIC METABOLIC PANEL
ANION GAP: 13 mmol/L (ref 7–15)
BLOOD UREA NITROGEN: 8 mg/dL (ref 7–21)
BUN / CREAT RATIO: 12
CALCIUM: 8.4 mg/dL — ABNORMAL LOW (ref 8.5–10.2)
CHLORIDE: 104 mmol/L (ref 98–107)
EGFR CKD-EPI AA FEMALE: >90 mL/min/{1.73_m2} (ref >=60)
EGFR CKD-EPI NON-AA FEMALE: >90 mL/min/{1.73_m2} (ref >=60)
GLUCOSE RANDOM: 132 mg/dL (ref 70–179)
POTASSIUM: 3.6 mmol/L (ref 3.5–5.0)
SODIUM: 139 mmol/L (ref 135–145)

## 2019-05-04 LAB — PROTIME: Coagulation tissue factor induced:Time:Pt:PPP:Qn:Coag: 12.3

## 2019-05-04 LAB — PHOSPHORUS: Phosphate:MCnc:Pt:Ser/Plas:Qn:: 4.4

## 2019-05-04 LAB — HEPATIC FUNCTION PANEL
ALBUMIN: 2.8 g/dL — ABNORMAL LOW (ref 3.5–5.0)
ALKALINE PHOSPHATASE: 167 U/L — ABNORMAL HIGH (ref 38–126)
ALT (SGPT): 7 U/L (ref <35)
BILIRUBIN DIRECT: 0.3 mg/dL (ref 0.00–0.40)
PROTEIN TOTAL: 5.2 g/dL — ABNORMAL LOW (ref 6.5–8.3)

## 2019-05-04 LAB — HEPARIN CORRELATION: Lab: 0.2

## 2019-05-04 LAB — APTT: APTT: 31.9 s (ref 25.3–37.1)

## 2019-05-04 LAB — PROTIME-INR: PROTIME: 12.3 s (ref 10.2–13.1)

## 2019-05-04 LAB — HEMATOCRIT: Hematocrit:VFr:Pt:Bld:Qn:: 36.1

## 2019-05-04 LAB — TRIGLYCERIDES: Triglyceride:MCnc:Pt:Ser/Plas:Qn:: 171 — ABNORMAL HIGH

## 2019-05-04 LAB — AST (SGOT): Aspartate aminotransferase:CCnc:Pt:Ser/Plas:Qn:: 14

## 2019-05-04 LAB — MAGNESIUM: Magnesium:MCnc:Pt:Ser/Plas:Qn:: 2.2

## 2019-05-04 NOTE — Unmapped (Signed)
SURGERY PROGRESS NOTE    Admit Date: 05/03/2019, Hospital Day: 2  Hospital Service: [facility])  Attending: [name], MD    Assessment   Mary Hawkins is a 38 y.o. female with history of Crohn's s/p ileocecetomy and Meckel diverticulectomy and rectopexy in 2015 c/b severe stenosis of anastomosis now s/p partial colectomy with terminal ileum resection and ileocolostomy on 9/17 who presents from OSH with diffuse abdominal pain and imaging findings demonstrating possible contained anastomotic leak and ileus.    Interval Events:  Patient became tachycardic this morning. EKG was ordered. VIR to drain collection today.     Plan     Neuro/Pain:   - scheduled IV Tylenol  -Oxycodone 5mg  or 10mg  q4h PRN  - D/C IV Dilaudid     Cardio:   - tachycardic, EKG ordered  -Labetalol 10mg  q4h for SBP >180    Pulmonary:   - encouraged IS    FEN/GI:   -F 16mL/hr sodium chloride 0.45%  -E replete PRN  -N NPO, TPN   -IV protonix and pepcid  -IV pepcid 20mg  bid   -Zofran PRN    GU/Renal:   - voiding spontaneously    Heme/ID:   - Flagyl/efepime (9/23-)    Prophylaxis:   - SQH    Disposition: floor status.     Please page SRG (2) Epifania Gore [phone] with questions or concerns.      Subjective     No acute events overnight. She complains of pain control issues but d/t her history of requiring narcan, we explained why we are being careful with narcotic medications.     Objective     Vitals:   Temp:  [36.8 ??C (98.2 ??F)-37.5 ??C (99.5 ??F)] 37.3 ??C (99.1 ??F)  Heart Rate:  [85-146] 122  Resp:  [18] 18  BP: (127-134)/(85-93) 129/85  MAP (mmHg):  [99-108] 108  SpO2:  [93 %-99 %] 99 %    Intake/Output last 24 hours:  I/O       09/24 0701 - 09/25 0700 09/25 0701 - 09/26 0700 09/26 0701 - 09/27 0700    P.O.  0 0    IV Piggyback  200     TPN  302.3     Total Intake(mL/kg)  502.3 (6.1) 0 (0)    Urine (mL/kg/hr) 400 4000 (2) 0 (0)    Emesis/NG output  0 250    Stool  0 0    Total Output 400 4000 250    Net -400 -3497.8 -250           Urine Occurrence  1 x 1 x    Stool Occurrence  3 x 2 x          Physical Exam:    -General:  Appropriate, comfortable and in no apparent distress.   -Neurological: Alert and oriented x3. Moves all 4 extremities spontaneously.   -Cardiovascular: Regular rate.  -Pulmonary: Normal work of breathing. No accessory muscle use.  -Abdomen: Soft, non-tender, non-distended. No rebound or guarding.  -Genitourinary: voiding spontaneously  -Extremities: Warm, well perfused, normal skin turgor.    -----------------------------------------------------    Data Review:  Lab Results   Component Value Date    WBC 20.6 (H) 05/04/2019    HGB 10.6 (L) 05/04/2019    HCT 36.1 05/04/2019    PLT 812 (H) 05/04/2019       Lab Results   Component Value Date    NA 139 05/04/2019    K 3.6 05/04/2019  CL 104 05/04/2019    CO2 22.0 05/04/2019    BUN 8 05/04/2019    CREATININE 0.68 05/04/2019    GLU 132 05/04/2019    CALCIUM 8.4 (L) 05/04/2019    MG 2.2 05/04/2019    PHOS 4.4 05/04/2019       Lab Results   Component Value Date    BILITOT 0.5 05/04/2019    BILIDIR 0.30 05/04/2019    PROT 5.2 (L) 05/04/2019    ALBUMIN 2.8 (L) 05/04/2019    ALT 7 05/04/2019    AST 14 05/04/2019    ALKPHOS 167 (H) 05/04/2019       Lab Results   Component Value Date    LABPROT 9.9 (L) 02/20/2014    INR 1.07 05/04/2019    APTT 31.9 05/04/2019         Imaging:  CT abdomen pelvis with contrast showed:  - Anastomotic leak at the ileocolic anastomosis, with gas, fluid, and oral contrast material seen outside the confines of bowel. The largest collection in the right lower quadrant measures up to 4.3 cm.  ??-Slightly increased distention of small bowel with air-fluid levels and distal tapering, compatible with ileus.    CXR shows proper PICC placement    I attest that I have reviewed the student note and that the components of the history of the present illness, the physical exam, and the assessment and plan documented were performed by me or were performed in my presence by the student where I verified the documentation and performed (or re-performed) the exam and medical decision making.    Reyes Ivan, MD, MS  Department of Surgery - PGY-1  P: [phone]

## 2019-05-04 NOTE — Unmapped (Addendum)
Patient was admitted for signs, symptom, and imaging findings concerning for possible contained anastomotic leak and ileus. CT Abd/pelvis with contrast confirmed this. VIR consulted for drainage on 05/04/2019 and CT guided aspiration of collection resulted in 8 ml of purulent fluid. Drain was not left in place. Culture showed 3 + mixed gram + organisms. IV antibiotics were continued and she was transitioned to po antibiotics on day of discharge.      Pain remained well-controlled. Patient was NPO and diet was slowly advanced following. Progress was made on ambulation. Bowel function gradually returned. Due to ongoing leukocytosis, stool was sent for c diff, which was negative.     Of note, she had an episode of tachycardia the morning of 05/04/2019. EKG showed sinus tachycardia, which resolved without intervention.     On the day of discharge the patient's pain was well-controlled on oral pain medication, she were tolerating a full diet and having bowel movements appropriately, she was voiding and ambulating independently, and was medically ready for discharge. PICC was removed prior to discharge. She is being discharged in good condition on 05/06/2019.

## 2019-05-04 NOTE — Unmapped (Signed)
VIR Post-Procedure Note    Procedure Name: CT aspiration    Pre-Op Diagnosis: Bowel leak    Post-Op Diagnosis: Same as pre-operative diagnosis    VIR Providers    Attending: Dr. Carmie End  Assistant: Dr. Levert Feinstein    Description of procedure: CT guided aspiration of right lower quadrant fluid collection yielding 8 mL of purulent fluid.  Near resolution of collection after aspiration.  No drainable collection identified.     Sedation: Moderate    Estimated Blood Loss: approximately 2 mL  Specimens: Fluid sent for labs that referring physician requested   Contrast: 0 mL  Complications: None      See detailed procedure note with images in PACS ([email or web address]).    The patient tolerated the procedure well without incident or complication and was returned to the Floor in stable condition.    Levert Feinstein, MD  05/04/2019 4:58 PM

## 2019-05-04 NOTE — Unmapped (Signed)
Pt alert/oriented x4. VSS, afebrile. No acute changes this shift. DVT prophylaxis initiated. Pain moderately controlled w/ current med regimen. Pt denies n/v at this time. Abdomen distended/firm/tender. Pt went for contrast CT this shift. CXR confirmed PICC placement. Pt refusing NGT at this time. All medications given as ordered. Bed in low/locked position, call light in reach. Will continue to monitor and assist patient as needed.      Problem: Adult Inpatient Plan of Care  Goal: Plan of Care Review  Outcome: Progressing  Goal: Patient-Specific Goal (Individualization)  Outcome: Progressing  Goal: Absence of Hospital-Acquired Illness or Injury  Outcome: Progressing  Goal: Optimal Comfort and Wellbeing  Outcome: Progressing  Goal: Readiness for Transition of Care  Outcome: Progressing  Goal: Rounds/Family Conference  Outcome: Progressing     Problem: Fall Injury Risk  Goal: Absence of Fall and Fall-Related Injury  Outcome: Progressing     Problem: Pain Acute  Goal: Optimal Pain Control  Outcome: Progressing     Problem: Wound  Goal: Optimal Wound Healing  Outcome: Progressing

## 2019-05-04 NOTE — Unmapped (Signed)
Patient is A&Ox4, very anxious and manic at times. Requesting IV pain meds and IV phenergan for nausea. Medicated with PO atarax and PO zofran for N/V. Patient had one episode of emesis, still refusing a NGT, HR tachy in 120-140's, SRG2 resident made aware, EKG ordered and completed. Patient has TPN and IV fluids infusing at ordered rates. Up as lib to the restroom, having loose bowel movements, call light is within patients reach, will continue to monitor.   Problem: Adult Inpatient Plan of Care  Goal: Plan of Care Review  Outcome: Progressing  Goal: Patient-Specific Goal (Individualization)  Outcome: Progressing  Goal: Absence of Hospital-Acquired Illness or Injury  Outcome: Progressing  Goal: Optimal Comfort and Wellbeing  Outcome: Progressing  Goal: Readiness for Transition of Care  Outcome: Progressing  Goal: Rounds/Family Conference  Outcome: Progressing     Problem: Fall Injury Risk  Goal: Absence of Fall and Fall-Related Injury  Outcome: Progressing     Problem: Pain Acute  Goal: Optimal Pain Control  Outcome: Progressing     Problem: Wound  Goal: Optimal Wound Healing  Outcome: Progressing

## 2019-05-04 NOTE — Unmapped (Signed)
VASCULAR INTERVENTIONAL RADIOLOGY  Drainage/Aspiration Consultation     Requesting Attending Physician: [name], MD  Service Requesting Consult: [name] [facility]    Date of Service: 05/04/2019  Consulting Interventional Radiologist: Dr. Carmie End     HPI:     Procedure Requested: Drainage  of right lower quadrant fluid collection    Mary Hawkins is a 38 y.o. female with hx of Chron's, multiple surgeries including a partial colectomy and ilectomy with ileocolostomy on 9/17. Patient presents with RLQ fluid collection consistent with abscess from anastomotic leak (CT A/P 05/03/2019).      Medical History:     Past Medical History:  Past Medical History:   Diagnosis Date   ??? Anxiety    ??? Arthritis    ??? Back pain    ??? Crohn disease (CMS-HCC)    ??? Eczema    ??? Endometriosis    ??? Fibromyalgia    ??? GERD (gastroesophageal reflux disease)    ??? Headache(784.0)    ??? Hypertension    ??? Ulcerative colitis (CMS-HCC)        Surgical History:  Past Surgical History:   Procedure Laterality Date   ??? BREAST RECONSTRUCTION  2004   ??? ENDOMETRIAL ABLATION     ??? HIP SURGERY     ??? PR COLONOSCOPY FLX DX W/COLLJ SPEC WHEN PFRMD N/A 11/12/2013    Procedure: COLONOSCOPY, FLEXIBLE, PROXIMAL TO SPLENIC FLEXURE; DIAGNOSTIC, W/WO COLLECTION SPECIMEN BY BRUSH OR WASH;  Surgeon: Teodoro Spray, MD;  [facility];  Service: Gastroenterology   ??? PR COLONOSCOPY FLX DX W/COLLJ SPEC WHEN PFRMD N/A 02/20/2014    Procedure: COLONOSCOPY, FLEXIBLE, PROXIMAL TO SPLENIC FLEXURE; DIAGNOSTIC, W/WO COLLECTION SPECIMEN BY BRUSH OR WASH;  Surgeon: Billie Ruddy, MD;  [facility];  Service: Gastroenterology   ??? PR COLONOSCOPY FLX DX W/COLLJ SPEC WHEN PFRMD N/A 12/27/2017    Procedure: COLONOSCOPY, FLEXIBLE, PROXIMAL TO SPLENIC FLEXURE; DIAGNOSTIC, W/WO COLLECTION SPECIMEN BY BRUSH OR WASH;  Surgeon: Bronson Curb, MD;  [facility];  Service: Gastroenterology   ??? PR COLONOSCOPY FLX DX W/COLLJ SPEC WHEN PFRMD N/A 03/15/2019    Procedure: COLONOSCOPY, FLEXIBLE, PROXIMAL TO SPLENIC FLEXURE; DIAGNOSTIC, W/WO COLLECTION SPECIMEN BY BRUSH OR WASH;  Surgeon: Thurmon Fair, MD;  Location: [facility] [facility];  Service: Gastroenterology   ??? PR REMVL COLON & TERM ILEUM W/ILEOCOLOSTOMY N/A 11/13/2013    Procedure: Ileocecectomy, meckel's diverticulectomy, mesh rectopexy;  Surgeon: Emilia Beck, MD;  Location: MAIN OR [place];  Service: Gastrointestinal   ??? PR REMVL COLON & TERM ILEUM W/ILEOCOLOSTOMY N/A 04/25/2019    Procedure: Colectomy, Partial, With Removal Of Terminal Ileum With Ileocolostomy;  Surgeon: [name], MD;  Location: MAIN OR ;  Service: Gastrointestinal   ??? PR UPPER GI ENDOSCOPY,BIOPSY N/A 11/12/2013    Procedure: UGI ENDOSCOPY; WITH BIOPSY, SINGLE OR MULTIPLE;  Surgeon: Teodoro Spray, MD;  [facility];  Service: Gastroenterology   ??? PR UPPER GI ENDOSCOPY,BIOPSY N/A 12/27/2017    Procedure: UGI ENDOSCOPY; WITH BIOPSY, SINGLE OR MULTIPLE;  Surgeon: Bronson Curb, MD;  [facility];  Service: Gastroenterology   ??? PR UPPER GI ENDOSCOPY,BIOPSY N/A 03/15/2019    Procedure: UGI ENDOSCOPY; WITH BIOPSY, SINGLE OR MULTIPLE;  Surgeon: Thurmon Fair, MD;  Location: [facility] [facility];  Service: Gastroenterology   ??? RECTAL PROLAPSE REPAIR         Family  History:  Family History   Problem Relation Age of Onset   ??? Esophageal cancer Maternal Grandfather    ??? Cancer Maternal Grandfather         Gastric cancer       Medications:   Current Facility-Administered Medications   Medication Dose Route Frequency Provider Last Rate Last Dose   ??? Adult 3-in-1 TPN   Intravenous Continuous Reyes Ivan III, MD 45 mL/hr at 05/03/19 2352     ??? [START ON 05/05/2019] Adult 3-in-1 TPN   Intravenous Continuous Hanaan Michell Heinrich, MD       ??? cefepime (MAXIPIME) 2 g in dextrose 100 mL IVPB (premix)  2 g Intravenous Q12H [facility] Claris Gladden, MD 200 mL/hr at 05/04/19 0501 2 g at 05/04/19 0501   ??? famotidine (PF) (PEPCID) injection 20 mg  20 mg Intravenous BID Ihor Gully, MD   20 mg at 05/03/19 2020   ??? gabapentin (NEURONTIN) capsule 800 mg  800 mg Oral TID Ihor Gully, MD   800 mg at 05/03/19 2020   ??? heparin (porcine) injection 5,000 Units  5,000 Units Subcutaneous [facility] Claris Gladden, MD   5,000 Units at 05/04/19 0502   ??? HYDROmorphone (PF) (DILAUDID) injection 0.5 mg  0.5 mg Intravenous Q4H PRN Claris Gladden, MD       ??? hydroxyzine (ATARAX) capsule/tablet 50 mg  50 mg Oral TID PRN Ihor Gully, MD   25 mg at 05/03/19 2021   ??? influenza vaccine quad (FLUARIX, FLULAVAL, FLUZONE) (6 MOS & UP) 2020-21  0.5 mL Intramuscular During hospitalization [name], MD       ??? labetaloL (NORMODYNE,TRANDATE) injection 10 mg  10 mg Intravenous Q4H PRN Claris Gladden, MD       ??? metroNIDAZOLE (FLAGYL) IVPB 500 mg  500 mg Intravenous Q8H Claris Gladden, MD 100 mL/hr at 05/04/19 0501 500 mg at 05/04/19 0501   ??? naloxone (NARCAN) injection 0.4 mg  0.4 mg Intravenous Daily PRN Claris Gladden, MD       ??? ondansetron (ZOFRAN-ODT) disintegrating tablet 4 mg  4 mg Oral Q6H PRN Claris Gladden, MD   4 mg at 05/04/19 0406   ??? oxyCODONE (ROXICODONE) immediate release tablet 5 mg  5 mg Oral Q4H PRN Hanaan Michell Heinrich, MD        Or   ??? oxyCODONE (ROXICODONE) immediate release tablet 10 mg  10 mg Oral Q4H PRN Hanaan Michell Heinrich, MD       ??? pantoprazole (PROTONIX) injection 40 mg  40 mg Intravenous daily Ihor Gully, MD   40 mg at 05/04/19 0506   ??? potassium chloride 10 mEq in 100 mL IVPB  10 mEq Intravenous Q1H Hanaan Michell Heinrich, MD       ??? pramipexole (MIRAPEX) tablet 0.75 mg  0.75 mg Oral Nightly Ihor Gully, MD   0.75 mg at 05/03/19 2021   ??? sodium chloride 0.45% (1/2 NS) infusion  30 mL/hr Intravenous Continuous Reyes Ivan III, MD 30 mL/hr at 05/04/19 0005 30 mL/hr at 05/04/19 0005 Allergies:  Penicillins and Doxycycline hcl    Social History:  Social History     Tobacco Use   ??? Smoking status: Current Every Day Smoker     Packs/day: 0.50     Years: 15.00     Pack years: 7.50     Types: Cigarettes   ??? Smokeless tobacco: Never Used   ??? Tobacco comment: Pt smokes 10cpd, pt expressed desire to minimize tobacco use  Substance Use Topics   ??? Alcohol use: Yes     Frequency: 2-4 times a month     Drinks per session: 1 or 2     Comment: socially   ??? Drug use: No       Objective:    Pertinent Laboratory Values:  WBC   Date Value Ref Range Status   05/04/2019 20.6 (H) 4.5 - 11.0 10*9/L Final   02/20/2014 8.6 4.5 - 11.0 10*9/L Final     HGB   Date Value Ref Range Status   05/04/2019 10.6 (L) 12.0 - 16.0 g/dL Final   16/05/9603 54.0 (L) 12.0 - 16.0 g/dL Final     HCT   Date Value Ref Range Status   05/04/2019 36.1 36.0 - 46.0 % Final   02/20/2014 37.8 36.0 - 46.0 % Final     Platelet   Date Value Ref Range Status   05/04/2019 812 (H) 150 - 440 10*9/L Final   02/20/2014 319 150 - 440 10*9/L Final     INR   Date Value Ref Range Status   05/04/2019 1.07  Final   02/20/2014 0.9  Final     Creatinine   Date Value Ref Range Status   05/04/2019 0.68 0.60 - 1.00 mg/dL Final   98/06/9146 8.29 0.60 - 1.00 mg/dL Final       Febrile:  No    Anticoaguation: Yes  If yes, type of anticoagulation Heparin SQ    Physical Exam:    Vitals:    05/04/19 0359   BP: 128/92   Pulse: 97   Resp: 18   Temp: 36.8 ??C (98.2 ??F)   SpO2: 97%     ASA Grade: ASA 3 - Patient with moderate systemic disease with functional limitations  General: No apparent distress.  Lungs: Breathing comfortably.  Heart:  Regular rate and rhythm.  Neuro: No obvious focal deficits.    Airway assessment: Class 3 - Can visualize soft palate    Assessment and Recommendations:     Ms. Tweedy is a 38 y.o. female with hx of Chron's, partial colectomy and ilectomy with ileocolostomy on 9/17. Patient presents with RLQ anastomotic leak and abscess formation (CT A/P 05/03/2019).  Team requesting drain placement.      Recommendations:  - VIR does recommend proceeding with Drainage  of RLQ fluid collection.  The procedure will be performed with CT guidance.    - Anticipated procedure date: 05/04/2019  - Please keep patient NPO  - Please ensure recent CBC, Creatinine, and INR are available    Informed Consent:  This procedure has been fully reviewed with the patient/patient???s authorized representative. The risks, benefits and alternatives have been explained, and the patient/patient???s authorized representative has consented to the procedure.  --The patient will accept blood products in an emergent situation.  --The patient does not have a Do Not Resuscitate order in effect.     The patient was discussed with  Dr. Carmie End.     Thank you for involving [place] in the care of this patient. Please page the VIR consult pager [phone]) with further questions, concerns, or if new issues arise.

## 2019-05-04 NOTE — Unmapped (Signed)
VSS.   Pt afebrile . Pt on IV abx.. .. No falls noted. Pt educated to call for assistance if needed.. Pt on TPN . Pain  controlled by  prn pain meds. DVT protocol in place. Pt on heparin [place] every 8 hrs . Will continue to monitor.    Problem: Adult Inpatient Plan of Care  Goal: Plan of Care Review  Outcome: Progressing  Goal: Patient-Specific Goal (Individualization)  Outcome: Progressing  Flowsheets (Taken 05/04/2019 0156)  Patient-Specific Goals (Include Timeframe): pt will have atolerable level of pain by discharge  Individualized Care Needs: pt prefers a dark room  Anxieties, Fears or Concerns: pain management  Goal: Absence of Hospital-Acquired Illness or Injury  Outcome: Progressing  Goal: Optimal Comfort and Wellbeing  Outcome: Progressing  Goal: Readiness for Transition of Care  Outcome: Progressing  Goal: Rounds/Family Conference  Outcome: Progressing     Problem: Fall Injury Risk  Goal: Absence of Fall and Fall-Related Injury  Outcome: Progressing     Problem: Pain Acute  Goal: Optimal Pain Control  Outcome: Progressing     Problem: Wound  Goal: Optimal Wound Healing  Outcome: Progressing

## 2019-05-05 LAB — BASIC METABOLIC PANEL
ANION GAP: 13 mmol/L (ref 7–15)
BLOOD UREA NITROGEN: 11 mg/dL (ref 7–21)
BUN / CREAT RATIO: 12
CALCIUM: 8.5 mg/dL (ref 8.5–10.2)
CO2: 19 mmol/L — ABNORMAL LOW (ref 22.0–30.0)
CREATININE: 0.94 mg/dL (ref 0.60–1.00)
EGFR CKD-EPI AA FEMALE: 90 mL/min/{1.73_m2} (ref >=60)
EGFR CKD-EPI NON-AA FEMALE: 78 mL/min/{1.73_m2} (ref >=60)
GLUCOSE RANDOM: 162 mg/dL (ref 70–179)
SODIUM: 141 mmol/L (ref 135–145)

## 2019-05-05 LAB — PHOSPHORUS: Phosphate:MCnc:Pt:Ser/Plas:Qn:: 4

## 2019-05-05 LAB — CBC
HEMOGLOBIN: 9.7 g/dL — ABNORMAL LOW (ref 12.0–16.0)
MEAN CORPUSCULAR HEMOGLOBIN CONC: 29.5 g/dL — ABNORMAL LOW (ref 31.0–37.0)
MEAN CORPUSCULAR HEMOGLOBIN: 26.1 pg (ref 26.0–34.0)
MEAN CORPUSCULAR VOLUME: 88.4 fL (ref 80.0–100.0)
MEAN PLATELET VOLUME: 7.7 fL (ref 7.0–10.0)
PLATELET COUNT: 630 10*9/L — ABNORMAL HIGH (ref 150–440)
RED BLOOD CELL COUNT: 3.71 10*12/L — ABNORMAL LOW (ref 4.00–5.20)
RED CELL DISTRIBUTION WIDTH: 16.7 % — ABNORMAL HIGH (ref 12.0–15.0)
WBC ADJUSTED: 34.8 10*9/L — ABNORMAL HIGH (ref 4.5–11.0)

## 2019-05-05 LAB — EGFR CKD-EPI NON-AA FEMALE: Lab: 78

## 2019-05-05 LAB — MAGNESIUM
MAGNESIUM: 2.2 mg/dL (ref 1.6–2.2)
Magnesium:MCnc:Pt:Ser/Plas:Qn:: 2.2

## 2019-05-05 LAB — MEAN CORPUSCULAR HEMOGLOBIN CONC: Lab: 29.5 — ABNORMAL LOW

## 2019-05-05 NOTE — Unmapped (Signed)
SURGERY PROGRESS NOTE    Admit Date: 05/03/2019, Hospital Day: 3  Hospital Service: [facility])  Attending: [name], MD    Assessment   Mary Hawkins is a 38 y.o. female with history of Crohn's s/p ileocecetomy and Meckel diverticulectomy and rectopexy in 2015 c/b severe stenosis of anastomosis now s/p partial colectomy with terminal ileum resection and ileocolostomy on 9/17 who presents from OSH with diffuse abdominal pain and imaging findings demonstrating possible contained anastomotic leak and ileus.    Interval Events:  Patient became tachycardic this morning. EKG was ordered. VIR yesterday; small fluid collection aspirated and no drain was placed. Had some emesis with regular diet.    Plan     Neuro/Pain:   - scheduled IV Tylenol  -Oxycodone 5mg  or 10mg  q4h PRN  - D/C IV Dilaudid     Cardio:   - tachycardic, EKG ordered  -Labetalol 10mg  q4h for SBP >180    Pulmonary:   - encouraged IS    FEN/GI:   -F 55mL/hr sodium chloride 0.45%  -E replete PRN  -N Regular diet, TPN   -IV protonix and pepcid  -IV pepcid 20mg  bid   -Zofran PRN  -C-diff testing    GU/Renal:   - voiding spontaneously    Heme/ID:   - Flagyl/efepime (9/23-)  -WBC 34.    Prophylaxis:   - SQH    Disposition: floor status.     Please page SRG (2) Epifania Gore [phone] with questions or concerns.      Subjective     No acute events overnight. She complains of pain control issues but d/t her history of requiring narcan, we explained why we are being careful with narcotic medications.     Objective     Vitals:   Temp:  [36.4 ??C-37.4 ??C] 36.4 ??C  Heart Rate:  [105-146] 105  Resp:  [17-35] 20  BP: (100-129)/(66-85) 124/78  MAP (mmHg):  [78-91] 90  SpO2:  [87 %-99 %] 96 %    Intake/Output last 24 hours:  I/O       09/25 0701 - 09/26 0700 09/26 0701 - 09/27 0700 09/27 0701 - 09/28 0700    P.O. 0 840     IV Piggyback 200 100     TPN 302.3      Total Intake 502.3 940     Urine (mL/kg/hr) 4000 (2) 1400 (0.7)     Emesis/NG output 0 250     Stool 0 1 Total Output(mL/kg) 4000 (48.7) 1651 (20.7)     Net -3497.8 -711            Urine Occurrence 1 x 5 x     Stool Occurrence 3 x 5 x           Physical Exam:    -General:  Appropriate, comfortable and in no apparent distress.   -Neurological: Alert and oriented x3. Moves all 4 extremities spontaneously.   -Cardiovascular: Regular rate.  -Pulmonary: Normal work of breathing. No accessory muscle use.  -Abdomen: Soft, non-tender, some distension. No rebound or guarding.  -Genitourinary: voiding spontaneously  -Extremities: Warm, well perfused, normal skin turgor.    -----------------------------------------------------    Data Review:  Lab Results   Component Value Date    WBC 34.8 (H) 05/05/2019    HGB 9.7 (L) 05/05/2019    HCT 32.8 (L) 05/05/2019    PLT 630 (H) 05/05/2019       Lab Results   Component Value Date  NA 141 05/05/2019    K 3.2 (L) 05/05/2019    CL 109 (H) 05/05/2019    CO2 19.0 (L) 05/05/2019    BUN 11 05/05/2019    CREATININE 0.94 05/05/2019    GLU 162 05/05/2019    CALCIUM 8.5 05/05/2019    MG 2.2 05/05/2019    PHOS 4.0 05/05/2019       Lab Results   Component Value Date    BILITOT 0.5 05/04/2019    BILIDIR 0.30 05/04/2019    PROT 5.2 (L) 05/04/2019    ALBUMIN 2.8 (L) 05/04/2019    ALT 7 05/04/2019    AST 14 05/04/2019    ALKPHOS 167 (H) 05/04/2019       Lab Results   Component Value Date    LABPROT 9.9 (L) 02/20/2014    INR 1.07 05/04/2019    APTT 31.9 05/04/2019         Imaging:  CT abdomen pelvis with contrast showed:  - Anastomotic leak at the ileocolic anastomosis, with gas, fluid, and oral contrast material seen outside the confines of bowel. The largest collection in the right lower quadrant measures up to 4.3 cm.  ??-Slightly increased distention of small bowel with air-fluid levels and distal tapering, compatible with ileus.    CXR shows proper PICC placement    Maggie Font, MD   PGY-1 General Surgery

## 2019-05-05 NOTE — Unmapped (Signed)
Poc reviewed with pt, verbalized understanding.   Problem: Adult Inpatient Plan of Care  Goal: Plan of Care Review  Outcome: Progressing  Goal: Patient-Specific Goal (Individualization)  Outcome: Progressing  Goal: Absence of Hospital-Acquired Illness or Injury  Outcome: Progressing  Goal: Optimal Comfort and Wellbeing  Outcome: Progressing  Goal: Readiness for Transition of Care  Outcome: Progressing  Goal: Rounds/Family Conference  Outcome: Progressing     Problem: Fall Injury Risk  Goal: Absence of Fall and Fall-Related Injury  Outcome: Progressing     Problem: Pain Acute  Goal: Optimal Pain Control  Outcome: Progressing     Problem: Wound  Goal: Optimal Wound Healing  Outcome: Progressing

## 2019-05-06 DIAGNOSIS — R188 Other ascites: Secondary | ICD-10-CM | POA: Insufficient documentation

## 2019-05-06 LAB — BASIC METABOLIC PANEL
ANION GAP: 10 mmol/L (ref 7–15)
BLOOD UREA NITROGEN: 10 mg/dL (ref 7–21)
BUN / CREAT RATIO: 12
CALCIUM: 8.4 mg/dL — ABNORMAL LOW (ref 8.5–10.2)
CHLORIDE: 109 mmol/L — ABNORMAL HIGH (ref 98–107)
CO2: 19 mmol/L — ABNORMAL LOW (ref 22.0–30.0)
EGFR CKD-EPI NON-AA FEMALE: 90 mL/min/{1.73_m2} (ref >=60)
GLUCOSE RANDOM: 168 mg/dL (ref 70–179)
POTASSIUM: 3.3 mmol/L — ABNORMAL LOW (ref 3.5–5.0)
SODIUM: 138 mmol/L (ref 135–145)

## 2019-05-06 LAB — CBC
HEMATOCRIT: 30.1 % — ABNORMAL LOW (ref 36.0–46.0)
HEMOGLOBIN: 8.9 g/dL — ABNORMAL LOW (ref 12.0–16.0)
MEAN CORPUSCULAR HEMOGLOBIN CONC: 29.5 g/dL — ABNORMAL LOW (ref 31.0–37.0)
MEAN CORPUSCULAR HEMOGLOBIN: 26.2 pg (ref 26.0–34.0)
MEAN CORPUSCULAR VOLUME: 88.7 fL (ref 80.0–100.0)
MEAN PLATELET VOLUME: 8 fL (ref 7.0–10.0)
PLATELET COUNT: 555 10*9/L — ABNORMAL HIGH (ref 150–440)
RED BLOOD CELL COUNT: 3.4 10*12/L — ABNORMAL LOW (ref 4.00–5.20)
WBC ADJUSTED: 35.5 10*9/L — ABNORMAL HIGH (ref 4.5–11.0)

## 2019-05-06 LAB — WBC ADJUSTED: Leukocytes:NCnc:Pt:Bld:Qn:: 35.5 — ABNORMAL HIGH

## 2019-05-06 LAB — PHOSPHORUS: Phosphate:MCnc:Pt:Ser/Plas:Qn:: 3.3

## 2019-05-06 LAB — EGFR CKD-EPI NON-AA FEMALE: Lab: 90

## 2019-05-06 LAB — MAGNESIUM: Magnesium:MCnc:Pt:Ser/Plas:Qn:: 1.9

## 2019-05-06 MED ORDER — METRONIDAZOLE 500 MG TABLET
ORAL_TABLET | Freq: Three times a day (TID) | ORAL | 0 refills | 10 days | Status: CP
Start: 2019-05-06 — End: 2019-05-16
  Filled 2019-05-06: qty 30, 10d supply, fill #0

## 2019-05-06 MED ORDER — CIPROFLOXACIN 500 MG TABLET
ORAL_TABLET | Freq: Two times a day (BID) | ORAL | 0 refills | 10 days | Status: CP
Start: 2019-05-06 — End: 2019-05-16
  Filled 2019-05-06: qty 20, 10d supply, fill #0

## 2019-05-06 MED ORDER — OXYCODONE 5 MG TABLET
ORAL_TABLET | 0 refills | 0 days | Status: CP
Start: 2019-05-06 — End: 2019-05-09
  Filled 2019-05-06: qty 30, 4d supply, fill #0

## 2019-05-06 MED FILL — CIPROFLOXACIN 500 MG TABLET: 10 days supply | Qty: 20 | Fill #0 | Status: AC

## 2019-05-06 MED FILL — OXYCODONE 5 MG TABLET: 4 days supply | Qty: 30 | Fill #0 | Status: AC

## 2019-05-06 MED FILL — METRONIDAZOLE 500 MG TABLET: 10 days supply | Qty: 30 | Fill #0 | Status: AC

## 2019-05-06 NOTE — Unmapped (Signed)
Alert and oriented, vss, febrile, pain control with the current regimen, patient didn't sleep through the night, reported pain of 10 out 10 despite prn meds given, will follow up with the team in the am rounding to adjust pain management, adequate u/o, BM noted x2, skin intact, no falls reported and no s/s of dvt noted and prophylaxis measure applied, will cont to monitor.  Problem: Adult Inpatient Plan of Care  Goal: Plan of Care Review  Outcome: Progressing  Goal: Patient-Specific Goal (Individualization)  Outcome: Progressing  Goal: Absence of Hospital-Acquired Illness or Injury  Outcome: Progressing  Goal: Optimal Comfort and Wellbeing  Outcome: Progressing  Goal: Readiness for Transition of Care  Outcome: Progressing  Goal: Rounds/Family Conference  Outcome: Progressing     Problem: Fall Injury Risk  Goal: Absence of Fall and Fall-Related Injury  Outcome: Progressing     Problem: Pain Acute  Goal: Optimal Pain Control  Outcome: Progressing     Problem: Wound  Goal: Optimal Wound Healing  Outcome: Progressing     Problem: Infection  Goal: Infection Symptom Resolution  Outcome: Progressing

## 2019-05-06 NOTE — Unmapped (Signed)
Discharge instructions reviewed with pt. All questions answered.  Pt expresses understanding.       Problem: Adult Inpatient Plan of Care  Goal: Plan of Care Review  Outcome: Discharged to Home  Goal: Patient-Specific Goal (Individualization)  Outcome: Discharged to Home  Goal: Absence of Hospital-Acquired Illness or Injury  Outcome: Discharged to Home  Goal: Optimal Comfort and Wellbeing  Outcome: Discharged to Home  Goal: Readiness for Transition of Care  Outcome: Discharged to Home  Goal: Rounds/Family Conference  Outcome: Discharged to Home     Problem: Fall Injury Risk  Goal: Absence of Fall and Fall-Related Injury  Outcome: Discharged to Home     Problem: Pain Acute  Goal: Optimal Pain Control  Outcome: Discharged to Home     Problem: Wound  Goal: Optimal Wound Healing  Outcome: Discharged to Home     Problem: Infection  Goal: Infection Symptom Resolution  Outcome: Discharged to Home

## 2019-05-06 NOTE — Unmapped (Signed)
[facility] Discharge Summary     Identifying Information: Mary Hawkins  [birth year]  [id number]   Code Status: Full Code   PCP:  [facility]   Primary Surgeon:  Dr. Rae Halsted       Admit date: 05/03/2019   Discharge date: 05/06/2019   Discharge Attending: [name], MD  Discharge Service: [name] [facility])  Discharge to: Home     Discharge Primary Diagnosis:   Principal Problem:    Intra-abdominal fluid collection  Active Problems:    Crohn's disease (CMS-HCC)        Hospital Course   Patient was admitted for signs, symptom, and imaging findings concerning for possible contained anastomotic leak and ileus. CT Abd/pelvis with contrast confirmed this. VIR consulted for drainage on 05/04/2019 and CT guided aspiration of collection resulted in 8 ml of purulent fluid. Drain was not left in place. Culture showed 3 + mixed gram + organisms. IV antibiotics were continued and she was transitioned to po antibiotics on day of discharge.      Pain remained well-controlled. Patient was NPO and diet was slowly advanced following. Progress was made on ambulation. Bowel function gradually returned. Due to ongoing leukocytosis, stool was sent for c diff, which was negative.     Of note, she had an episode of tachycardia the morning of 05/04/2019. EKG showed sinus tachycardia, which resolved without intervention.     On the day of discharge the patient's pain was well-controlled on oral pain medication, she were tolerating a full diet and having bowel movements appropriately, she was voiding and ambulating independently, and was medically ready for discharge. PICC was removed prior to discharge. She is being discharged in good condition on 05/06/2019.     Condition at Discharge: good  Patient is back to their functional baseline and is self-managing all activites of daily living.     Discharge Day Note:     Assessment and Discharge Day Services:  The patient was seen and examined by the [facility]) team on the day of discharge. Vital signs and laboratory values were assessed. Surgical wounds were inspected. Discharge plan was discussed, instructions were given, and all questions answered.     Subjective:  No acute events overnight. Pain Controlled. No fever or chills.    Objective:   BP 110/76  - Pulse 97  - Temp 37.2 ??C (99 ??F) (Oral)  - Resp 18  - Ht 157.5 cm (5' 2)  - Wt 79.9 kg (176 lb 2.4 oz)  - LMP 04/24/2019  - SpO2 92%  - BMI 32.22 kg/m??     General Appearance:   No acute distress  Abdomen:               soft, nontender, no peritoneal signs, incision clean, dry, intact and moderately bloated  Discharge Medications:      Your Medication List      STOP taking these medications    polyethylene glycol 17 gram packet  Commonly known as: MIRALAX        START taking these medications    ciprofloxacin HCl 500 MG tablet  Commonly known as: CIPRO  Take 1 tablet (500 mg total) by mouth every twelve (12) hours for 10 days.     metroNIDAZOLE 500 MG tablet  Commonly known as: FLAGYL  Take 1 tablet (500 mg total) by mouth Three (3) times a day for 10 days.        CONTINUE taking  these medications    acetaminophen 500 MG tablet  Commonly known as: TYLENOL  Take 1,000 mg by mouth every six (6) hours as needed for pain.     cyanocobalamin 1,000 mcg/mL injection  Inject 1 mL (1,000 mcg total) under the skin every thirty (30) days.     famotidine 40 MG tablet  Commonly known as: PEPCID  Take 1/2 tablet (20 mg total) by mouth Two (2) times a day.     gabapentin 800 MG tablet  Commonly known as: NEURONTIN  Take 800 mg by mouth Four (4) times a day with a meal and nightly.     hydrOXYzine 50 MG tablet  Commonly known as: ATARAX  Take 1 each (50 mg total) by mouth Three (3) times a day as needed.     magnesium oxide 400 mg (241.3 mg magnesium) tablet  Commonly known as: MAG-OX  Take 2 tablets (800 mg total) by mouth Two (2) times a day.     nicotine polacrilex 4 MG gum  Commonly known as: NICORETTE  Apply 1 each (4 mg total) to cheek every two (2) hours as needed for smoking cessation.     nicotine polacrilex 4 MG lozenge  Commonly known as: NICORETTE  Apply 1 lozenge (4 mg total) to cheek every two (2) hours as needed for smoking cessation.     omeprazole 40 MG capsule  Commonly known as: PriLOSEC  Take 40 mg by mouth daily.     oxyCODONE 5 MG immediate release tablet  Commonly known as: ROXICODONE  Take 1-2 tablets every 4-6 hours as needed for pain     pantoprazole 40 MG tablet  Commonly known as: PROTONIX  Take 1 tablet (40 mg total) by mouth daily.     potassium chloride 20 MEQ CR tablet  Commonly known as: KLOR-CON  Take 1 tablet (20 mEq total) by mouth daily.     pramipexole 0.75 MG tablet  Commonly known as: MIRAPEX  Take 0.75 mg by mouth nightly.              Discharge Instructions:  Follow Up instructions and Outpatient Referrals     Discharge instructions            Other Instructions     Discharge instructions      REASONS TO CALL after discharge:  You have a fever with a temperature of more than 101.5 F.  You have new nausea and vomiting that you did not have in the hospital, or you have worse nausea and vomiting than when you were in the hospital.  You are not eating or drinking because of nausea and vomiting.  Your pain is getting worse, not better.  You feel dehydrated - your urine is very dark, you feel light-headed, dizzy and weak.  Your incision is turning red, has opened up or has new drainage.    You have any concerns and are not sure if you should be worried.      WHO TO CONTACT after discharge:    For all questions/concerns DURING BUSINESS HOURS (Monday-Friday, 8:00 am - 4:30 pm) call your Nurse Coordinator:    Everlene Balls at [phone]        Nurse Coordinator for Dr. Epifania Gore    If you are unable to reach your Nurse Coordinator directly during business hours, you may call the GI Surgery Clinic at [phone].    For URGENT CONCERNS AFTER BUSINESS HOURS call the [facility] Operator at [phone] and ask for  the Surgical Resident On-Call. You will be directed to a surgery resident who is likely not familiar with your specific care, but can help you deal with issues that cannot wait until regular business hours. Please be aware that the person is responding to many in-hospital emergencies and patient issues, and may not answer your phone call immediately.    To SCHEDULE a FOLLOW-UP APPOINTMENT between 8:00 am - 5:00 pm call:  [facility] Surgery Clinic  [phone]                 Future Appointments:  Appointments which have been scheduled for you    May 14, 2019  2:30 PM  (Arrive by 2:00 PM)  RETURN VIDEO - OTHER with Thurmon Fair, MD  [facility] REG[facility]) [address]  [place] [address]  [phone]      May 21, 2019  1:45 PM  (Arrive by 1:15 PM)  RETURN  GENERAL with [name], MD  [facility] MULTISPECIALTY SURGERY GI SURGERY [facility] REGION) [address]  [place] [address]  [phone]      May 22, 2019 10:40 AM  (Arrive by 10:10 AM)  NEW GYN RESIDENT with Holli Humbles  [facility] OB GYN [facility] REGION) [address]  [place] [address]  [phone]             I personally spent > 30 minutes in discharge planning  Ellie Lunch, MSN, FNP

## 2019-05-06 NOTE — Unmapped (Signed)
Patient is A&Ox4 and VSS. Pt is tolerating diet, denies N/V. Surgical site intact. Pain is well controlled with PRN pain medication. DVT prophylaxis continued. All medications given as ordered. No falls or injury noted, bed in lowest position/locked, and call bell within reach. Will continue to monitor and assist Pt as needed.    Problem: Adult Inpatient Plan of Care  Goal: Plan of Care Review  Outcome: Progressing  Goal: Patient-Specific Goal (Individualization)  Outcome: Progressing  Goal: Absence of Hospital-Acquired Illness or Injury  Outcome: Progressing  Goal: Optimal Comfort and Wellbeing  Outcome: Progressing  Goal: Readiness for Transition of Care  Outcome: Progressing  Goal: Rounds/Family Conference  Outcome: Progressing     Problem: Fall Injury Risk  Goal: Absence of Fall and Fall-Related Injury  Outcome: Progressing     Problem: Pain Acute  Goal: Optimal Pain Control  Outcome: Progressing

## 2019-05-06 NOTE — Unmapped (Signed)
Adult Nutrition TPN Sign-Off Note    Visit Type: Consult   Reason for Visit: Parenteral nutrition sign off     Nutrition Progress:  Patient on TPN from 9/24 at OSH to 9/26.     Pt has been tolerating regular diet and team has allowed TPN to expire.      Current Nutrition Therapy Order:   Nutrition Orders          Nutrition Therapy General (Regular) starting at 09/26 1751            RECOMMENDATIONS, INTERVENTIONS, AND GOALS  Recommend continue current nutrition therapy of regular diet    Continue to monitor and replete electrolytes as necessary.   Sign off from Adult TPN Service. Service RD will continue to monitor.     RD Follow Up:  Signing off at this time (Please reconsult if needed)      Loel Lofty, PharmD Candidate     Ka Flammer E Deloris Moger BS Pharm - Adult TPN Service

## 2019-05-08 ENCOUNTER — Telehealth: Payer: Self-pay

## 2019-05-08 ENCOUNTER — Telehealth: Payer: Self-pay | Admitting: *Deleted

## 2019-05-08 DIAGNOSIS — F411 Generalized anxiety disorder: Secondary | ICD-10-CM

## 2019-05-08 DIAGNOSIS — F339 Major depressive disorder, recurrent, unspecified: Secondary | ICD-10-CM

## 2019-05-08 MED ORDER — PRAMIPEXOLE DIHYDROCHLORIDE 0.75 MG PO TABS: 0.7500 mg | ORAL_TABLET | Freq: Every day | ORAL | 1 refills | Status: DC

## 2019-05-08 MED ORDER — FERROUS GLUCONATE 324 (38 FE) MG PO TABS: 324.0000 mg | ORAL_TABLET | Freq: Every day | ORAL | 3 refills | Status: DC

## 2019-05-08 MED ORDER — SAVELLA 25 MG PO TABS: ORAL_TABLET | ORAL | 2 refills | Status: DC

## 2019-05-08 NOTE — Unmapped (Signed)
Behavioral Health Care Management            Date of Service:  05/08/2019        Date of Last Encounter:  04/16/2019    Purpose of contact:     [facility] Social Worker contacted the patient as part of the [facility]MP- LVM.    Follow Up Plan/Next Steps:    []  Schedule follow up appointment with GI Physician  []  Provide referral/linkage information:  []  Consult with GI Physician, re: care plan and patient's treatment progress  [x]  Other: f/u mood, assist pt in rescheduling appt with [facility]      Additional Information/Plan:  Pt was provided with my direct office phone number should additional needs arise.    Future Appointments   Date Time Provider Department Center   05/14/2019  2:30 PM Thurmon Fair, MD [facility]   05/21/2019  1:45 PM [name], MD [facility]   05/22/2019 10:40 AM Holli Humbles [facility]       Ottie Glazier, MSW, M.Ed, LCSWA  Social Worker- MSW  [facility]  [phone]

## 2019-05-08 NOTE — Telephone Encounter (Signed)
The patient is on Mirapex 0.75 mg at night.  I will send in a prescription.

## 2019-05-08 NOTE — Telephone Encounter (Signed)
Received fax from [facility] requesting a refill on Pramipexole 0.25 mg. This med is not currently on med list. I did see on 03/05/19 Dr. Jannifer Franklin recommended the pt to take 0.75 mg daily of the Pramipexole. I have pended the order for MD to review and sign if appropriate.

## 2019-05-08 NOTE — Addendum Note (Signed)
Addended by: Kathrynn Ducking on: 05/08/2019 01:53 PM   Modules accepted: Orders

## 2019-05-08 NOTE — Telephone Encounter (Signed)
Pt called, request a call from Dr Jannifer Franklin. Please call [phone] or cell [phone]

## 2019-05-08 NOTE — Telephone Encounter (Signed)
I called the patient.  The patient is still having troubles with restless leg syndrome, she just was admitted for problems with the bowels, she had to have a bowel resection, she likely has significant problems with iron deficiency anemia, we will place her on ferrous gluconate, she will continue the Mirapex 0.75 mg in the evening.  She is still having fibromyalgia pain, she is on gabapentin taking 800 mg 4 times daily, she got very little benefit from the Cymbalta, she will stop this and we will start Savella 25 mg daily for 2 weeks and then go to 25 mg twice daily.

## 2019-05-09 MED ORDER — OXYCODONE 5 MG TABLET
ORAL_TABLET | ORAL | 0 refills | 0.00000 days | Status: CP
Start: 2019-05-09 — End: 2019-05-13

## 2019-05-09 MED ORDER — OXYCODONE 5 MG TABLET: tablet | 0 refills | 0 days | Status: AC

## 2019-05-09 MED FILL — POTASSIUM CHLORIDE ER 20 MEQ TABLET,EXTENDED RELEASE(PART/CRYST): ORAL | 30 days supply | Qty: 30 | Fill #0

## 2019-05-09 MED FILL — FAMOTIDINE 40 MG TABLET: 30 days supply | Qty: 30 | Fill #2 | Status: AC

## 2019-05-09 MED FILL — MAGNESIUM OXIDE 400 MG (241.3 MG MAGNESIUM) TABLET: 30 days supply | Qty: 120 | Fill #1 | Status: AC

## 2019-05-09 MED FILL — FAMOTIDINE 40 MG TABLET: ORAL | 30 days supply | Qty: 30 | Fill #2

## 2019-05-09 MED FILL — POTASSIUM CHLORIDE ER 20 MEQ TABLET,EXTENDED RELEASE(PART/CRYST): 30 days supply | Qty: 30 | Fill #0 | Status: AC

## 2019-05-09 MED FILL — MAGNESIUM OXIDE 400 MG (241.3 MG MAGNESIUM) TABLET: ORAL | 30 days supply | Qty: 120 | Fill #1

## 2019-05-09 MED FILL — CYANOCOBALAMIN (VIT B-12) 1,000 MCG/ML INJECTION SOLUTION: 90 days supply | Qty: 3 | Fill #1 | Status: AC

## 2019-05-09 MED FILL — CYANOCOBALAMIN (VIT B-12) 1,000 MCG/ML INJECTION SOLUTION: SUBCUTANEOUS | 90 days supply | Qty: 3 | Fill #1

## 2019-05-09 NOTE — Unmapped (Signed)
TC from pt requesting for oxy refill. She still has a 9-10tabs left but does not want to run out during the weekend. She was d/c with 30 tabs on 9/28/20Her pain is slightly improving and she is also requesting for a muscle relaxer or antiinflammatory medications. She is taking 20mg  oxy and states that sometimes she takes an extra oxy 5mg . She had a few questions about dressings. Advised that its ok to shower and remove all the dressings. She has one tear (not surgical area) that she is worried about-she will send a picture. I told her I will check with MD regarding med refills

## 2019-05-10 NOTE — Unmapped (Signed)
10/02-spoke with (mom) Kennyth Arnold states pt is no longer on Stelara,she will be starting Remicade,advised her we would follow-up in 1 month before dis-enrolling-CB

## 2019-05-13 NOTE — Unmapped (Signed)
TC from pt requesting for one more oxy refill. I told her I didn't think it was appropriate as she just received 20 tabs on 05/09/19 but I will forward her request to MD. Also received 30tabs on 05/06/19. Has follow up appt on 05/21/19

## 2019-05-14 ENCOUNTER — Other Ambulatory Visit: Payer: Medicaid Other

## 2019-05-14 ENCOUNTER — Ambulatory Visit: Admit: 2019-05-14 | Discharge: 2019-05-15 | Payer: MEDICAID | Attending: Surgery | Primary: Surgery

## 2019-05-14 ENCOUNTER — Telehealth: Admit: 2019-05-14 | Discharge: 2019-05-15 | Payer: MEDICAID | Attending: Internal Medicine | Primary: Internal Medicine

## 2019-05-14 DIAGNOSIS — Z5181 Encounter for therapeutic drug level monitoring: Secondary | ICD-10-CM

## 2019-05-14 DIAGNOSIS — F39 Unspecified mood [affective] disorder: Secondary | ICD-10-CM

## 2019-05-14 DIAGNOSIS — Z79899 Other long term (current) drug therapy: Secondary | ICD-10-CM

## 2019-05-14 DIAGNOSIS — K50818 Crohn's disease of both small and large intestine with other complication: Secondary | ICD-10-CM

## 2019-05-14 DIAGNOSIS — G8929 Other chronic pain: Secondary | ICD-10-CM

## 2019-05-14 DIAGNOSIS — R109 Unspecified abdominal pain: Secondary | ICD-10-CM

## 2019-05-14 DIAGNOSIS — G4700 Insomnia, unspecified: Secondary | ICD-10-CM

## 2019-05-14 DIAGNOSIS — R635 Abnormal weight gain: Secondary | ICD-10-CM

## 2019-05-14 MED ORDER — OXYCODONE 5 MG TABLET
ORAL_TABLET | 0 refills | 0 days | Status: CP
Start: 2019-05-14 — End: ?

## 2019-05-14 MED ORDER — TRAZODONE 50 MG TABLET
ORAL_TABLET | Freq: Every evening | ORAL | 2 refills | 30.00000 days | Status: CP
Start: 2019-05-14 — End: ?

## 2019-05-14 MED ORDER — HYOSCYAMINE 0.125 MG SUBLINGUAL TABLET
ORAL_TABLET | Freq: Four times a day (QID) | SUBLINGUAL | 1 refills | 15 days | Status: CP | PRN
Start: 2019-05-14 — End: ?

## 2019-05-14 MED ORDER — HYDROXYZINE PAMOATE 50 MG CAPSULE
ORAL_CAPSULE | Freq: Three times a day (TID) | ORAL | 2 refills | 30 days | Status: CP | PRN
Start: 2019-05-14 — End: ?

## 2019-05-14 NOTE — Unmapped (Signed)
Per MD, will prescribe only 5 oxy tabs and no more refills

## 2019-05-14 NOTE — Unmapped (Signed)
[facility]  TELEPHONE NOTE - INFLAMMATORY BOWEL DISEASE  05/14/2019    Demographics:  Mary Hawkins is a 38 y.o. female with Crohn's disease    Referring physician:   Donnita Falls          HPI / NOTE :     Today, I spoke to Mary Hawkins for follow up consultation in the [facility] at the request of Dr. Iona Hansen regarding Crohn's Disease.  Prior records including available clinical notes, endoscopy reports, imaging results and pathology results were reviewed in detail as part of this follow up consultation.     CC: f/u after surgery    HPI:  I spoke to Mary Hawkins on the phone today for follow up. She is very irritable throughout interview and says she is absolutely not improving and that her pain is worse than before surgery. She underwent surgery with Dr. Epifania Gore on 04/25/19. Op note comments on thick inflamed friable terminal ileum and right colon; 10 inches of bowel resected and new side to side ileum to right colon anastomosis created. She was hospitalized through 04/29/19 and had rapid response while hospitalized for somnolence with improvement in mental status with Narcan. She was readmitted on 05/03/19 with contained anastomotic leak and ileus, VIR drained collection on 05/04/19 and was transitioned to PO abx before discharge. She has been calling surgery team asking for more pain medications and today she asks me frequently for pain medications throughout her visit, saying that surgery team told her she had to ask me to give pain medications to her. She says her abdomen is still swollen and hurts to the touch.     Her BMs are less frequent before surgery. Now having 3-5 BMs per day with no blood. She thinks she may have a hemorrhoid that acts up occasionally. She takes Lomotil at night but says she has a lot of this at home and doesn't need a refill - she just sent a request for this but I had recently refilled.     We previously discussed medication management for her Crohn's disease. She understand that I can only prescribe infusions at [facility] and at specific infusion centers in [place]. She does not have local GI but says she will absolutely not come to [facility] for her infusions.     She continues to smoke. Says she got up close to 1 ppd and has now cut down to 1/2 ppd. She says she is on the road to quitting.     She has been trying to walk and doesn't know why her weight is so high. She says she restarted her Remeron although we had stopped this in the past due to concern that it was causing weight gain. She says that her Neurologist discontinued her Cymbalta and started her on Savella instead due to RLS. She is asking for refill of hydroxyzine today too.     Path from surgery 04/25/19:  A: Terminal ileum and colon, segmental resection  - Anastomosed segment of terminal ileum and colon with moderately active chronic enteritis and colitis with transmural inflammation, multifocal mucosal ulcerations, and focal perforation, clinically history of Crohn's disease  - Margins appear histologically viable and negative for dysplasia or malignancy   - No CMV viral cytopathic effect, granuloma, or dysplasia identified  - One lymph nodes, negative for malignancy (0/1)  - Separate unoriented segment of terminal ileum with moderately active chronic enteritis and acute serositis; negative for dysplasia or malignancy    Review of Systems:  positive as per HPI   Otherwise, the balance of 10 systems is negative.          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 02/2010 start of Humira. 03/2011 stop Humira due to recurrent skin infections, started Entocort, issues with rectal prolapse and found to have ileal stricture. 11/13/13 ileocecal resection with Dr. Epifania Gore (28 cm of ileum resected, 5 cm colon resected - path c/w Crohn's disease but no granulomas), also with incidental Meckel's diverticulum noted and resected, mesh rectopexy for rectal prolapse. 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 03/18/14 discussed Remicade/Imuran but did not start these medications. 5/1-5/15/19 admission to [facility] for ARDS 2/2 rhinovirus requiring mechanical ventilation, AKI. 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 02/16/18 IV loading dose of ustekinumab followed by ustekinumab 90 mg  every 8 weeks; self-discontinued ustekinumab. 08/2018 repeat IV load ustekinumab. 01/2019 hip fracture. 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 CTE with 5-10 cm ileal disease, 4.9 x 5.9 cm complex R adnexal cyst. 04/25/19 10 inches small bowel resected c/b post op anastomotic leak requiring VIR aspiration, abx.     Endoscopy:      Colon 03/15/19: Diffuse inflammation with large deep lesions and narrowing at anastomosis, unable to traverse. External hemorrhoids and possible sinus/fistula tract found on perianal exam.     EGD 03/15/19: Normal esophagus. Mild gastritis. Normal duodenum.     Colon 12/27/17: Mild inflammation and a few ulcerations at ileocolonic anastomosis. Rutgeert's i2. Localized mildly granular mucosa in the sigmoid colon.  - path: Small bowel - patchy crypt architectural distortion with Paneth cell hyperplasia, consistent with mild chronic active enteritis, negative for dysplasia, no granulomas. Colon - unremarkable colonic mucosa.     EGD 12/27/17: Grade D esophagitis, small hiatal hernia, otherwise normal stomach and duodenum.   - path: ulceration with inflamed granulation tissue, no granulomas    Colon 02/20/14: Multiple discrete ulcerations seen corresponding to Rutgeert's i2.     EGD 11/12/13: Normal esophagus. Markedly thickened gastric folds without inflammation seen. Normal duodenum.     Imaging:    CT A/P 04/30/19: 1. Extraluminal gas seen adjacent to the ileocolic anastomosis with surrounding inflammatory changes in the right lower quadrant concerning for site of perforation versus possible postsurgical changes given recent history of surgery at outside institution  [facility]). Recommend correlation with surgical history. Distension of the small bowel with air-fluid levels within bowel measuring up to 3.2 cm with concerning for partial small bowel obstruction versus ileus. Uterus and bilateral adnexa are unremarkable.     CTE 03/20/19: Sequela of ileocecal resection with anastomosis. Mild wall thickening involving 5-10 cm segment of neo-terminal ileum. Redundant sigmoid colon. Complex cystic right adnexal lesion versus multiple ovarian cysts. Recommend dedicated pelvic ultrasound for further evaluation.    CT Head 10/23/18: Resolution of the previously seen right frontal subarachnoid  hemorrhage. No acute intracranial abnormality.    CT Head/CT Cervical Spine 10/20/18: Suggestion of tiny focus of acute subarachnoid hemorrhage in the right posterior frontal convexity. This may represent a tiny surface contusion. Normal alignment of the cervical spine. No acute displaced fractures  identified.    CT Abd/Pelvis 08/25/18: Multiple fluid-filled loops of small bowel and fluid throughout the colon as can be seen with enterocolitis. Otherwise no acute abdominal or pelvic pathology. Left ovarian cyst.    CT A/P 12/24/17: Mild wall thickening of the neoterminal ileum, possibly mild ileus. Right lower quadrant adenopathy, probably reactive.    Echo 12/09/17: - Left ventricle: The cavity size was normal. Wall thickness was   ??increased in a pattern of mild LVH. Systolic function was   ??vigorous. The estimated ejection fraction was in the range of 65%   ??to 70%. Doppler parameters are consistent with abnormal left   ??ventricular relaxation (grade 1 diastolic dysfunction).  - Pulmonary arteries: PA peak pressure: 42 mm Hg (S).    Prior IBD medications (type, dose, duration, response):  - prednisone  - Entocort  - adalimumab 2011-2012, good response but developed recurrent skin infections prompting discontinuation  - ustekinumab 02/2018 - 04/2018 before self discontinued, reload 08/2018 and then 90 mg [place] every 8 weeks           Past Medical History:   Past medical history:   Past Medical History:   Diagnosis Date   ??? Anxiety    ??? Arthritis    ??? Back pain    ??? Crohn disease (CMS-HCC)    ??? Eczema    ??? Endometriosis    ??? Fibromyalgia    ??? GERD (gastroesophageal reflux disease)    ??? Headache(784.0)    ??? Hypertension    ??? Ulcerative colitis (CMS-HCC)      Past surgical history:   Past Surgical History:   Procedure Laterality Date   ??? BREAST RECONSTRUCTION  2004   ??? ENDOMETRIAL ABLATION     ??? HIP SURGERY     ??? PR COLONOSCOPY FLX DX W/COLLJ SPEC WHEN PFRMD N/A 11/12/2013    Procedure: COLONOSCOPY, FLEXIBLE, PROXIMAL TO SPLENIC FLEXURE; DIAGNOSTIC, W/WO COLLECTION SPECIMEN BY BRUSH OR WASH;  Surgeon: Teodoro Spray, MD;  Location: [facility];  Service: Gastroenterology   ??? PR COLONOSCOPY FLX DX W/COLLJ SPEC WHEN PFRMD N/A 02/20/2014    Procedure: COLONOSCOPY, FLEXIBLE, PROXIMAL TO SPLENIC FLEXURE; DIAGNOSTIC, W/WO COLLECTION SPECIMEN BY BRUSH OR WASH;  Surgeon: Billie Ruddy, MD;  Location: [facility];  Service: Gastroenterology   ??? PR COLONOSCOPY FLX DX W/COLLJ SPEC WHEN PFRMD N/A 12/27/2017    Procedure: COLONOSCOPY, FLEXIBLE, PROXIMAL TO SPLENIC FLEXURE; DIAGNOSTIC, W/WO COLLECTION SPECIMEN BY BRUSH OR WASH;  Surgeon: Bronson Curb, MD;  Location: GI [facility]URES [facility];  Service: Gastroenterology   ??? PR COLONOSCOPY FLX DX W/COLLJ SPEC WHEN PFRMD N/A 03/15/2019    Procedure: COLONOSCOPY, FLEXIBLE, PROXIMAL TO SPLENIC FLEXURE; DIAGNOSTIC, W/WO COLLECTION SPECIMEN BY BRUSH OR WASH;  Surgeon: Thurmon Fair, MD;  Location: [facility] PROCEDURES [place] [facility];  Service: Gastroenterology ??? PR REMVL COLON & TERM ILEUM W/ILEOCOLOSTOMY N/A 11/13/2013    Procedure: Ileocecectomy, meckel's diverticulectomy, mesh rectopexy;  Surgeon: Emilia Beck, MD;  Location: MAIN OR [place];  Service: Gastrointestinal   ??? PR REMVL COLON & TERM ILEUM W/ILEOCOLOSTOMY N/A 04/25/2019    Procedure: Colectomy, Partial, With Removal Of Terminal Ileum With Ileocolostomy;  Surgeon: [name], MD;  Location: MAIN OR [place];  Service: Gastrointestinal   ??? PR UPPER GI ENDOSCOPY,BIOPSY N/A 11/12/2013    Procedure: UGI ENDOSCOPY; WITH BIOPSY, SINGLE OR MULTIPLE;  Surgeon: Teodoro Spray, MD;  Location: GI [facility]URES [facility];  Service: Gastroenterology   ??? PR UPPER GI  ENDOSCOPY,BIOPSY N/A 12/27/2017    Procedure: UGI ENDOSCOPY; WITH BIOPSY, SINGLE OR MULTIPLE;  Surgeon: Bronson Curb, MD;  Location: [facility];  Service: Gastroenterology   ??? PR UPPER GI ENDOSCOPY,BIOPSY N/A 03/15/2019    Procedure: UGI ENDOSCOPY; WITH BIOPSY, SINGLE OR MULTIPLE;  Surgeon: Thurmon Fair, MD;  Location: [facility] [place] [facility];  Service: Gastroenterology   ??? RECTAL PROLAPSE REPAIR       Family history:   Family History   Problem Relation Age of Onset   ??? Esophageal cancer Maternal Grandfather    ??? Cancer Maternal Grandfather         Gastric cancer     Social history: Single - has son. She is currently smoking 1/2 ppd (down from 1 ppd) - she is very encouraged to quit smoking currently. Occasional alcohol use. No illicit drug use.   Social History     Socioeconomic History   ??? Marital status: Single     Spouse name: None   ??? Number of children: None   ??? Years of education: None   ??? Highest education level: None   Occupational History   ??? None   Social Needs   ??? Financial resource strain: None   ??? Food insecurity     Worry: Never true     Inability: Never true   ??? Transportation needs     Medical: None     Non-medical: None   Tobacco Use   ??? Smoking status: Current Every Day Smoker     Packs/day: 0.50     Years: 15.00     Pack years: 7.50     Types: Cigarettes   ??? Smokeless tobacco: Never Used   ??? Tobacco comment: Pt smokes 10cpd, pt expressed desire to minimize tobacco use    Substance and Sexual Activity   ??? Alcohol use: Yes     Frequency: 2-4 times a month     Drinks per session: 1 or 2     Comment: socially   ??? Drug use: No   ??? Sexual activity: None   Lifestyle   ??? Physical activity     Days per week: None     Minutes per session: None   ??? Stress: None   Relationships   ??? Social Wellsite geologist on phone: None     Gets together: None     Attends religious service: None     Active member of club or organization: None     Attends meetings of clubs or organizations: None     Relationship status: None   Other Topics Concern   ??? None   Social History Narrative    Currently unemployed.  She is a 71-year-old son which takes care of.             Allergies:     Allergies   Allergen Reactions   ??? Penicillins Hives     Has patient had a PCN reaction causing immediate rash, facial/tongue/throat swelling, SOB or lightheadedness with hypotension: Yes  Has patient had a PCN reaction causing severe rash involving mucus membranes or skin necrosis: Unk  Has patient had a PCN reaction that required hospitalization: Yes  Has patient had a PCN reaction occurring within the last 10 years: No  If all of the above answers are NO, then may proceed with Cephalosporin use.  Has patient had a PCN reaction causing immediate rash, facial/tongue/throat swelling, SOB or lightheadedness with hypotension: Yes  Has patient had a PCN reaction causing severe rash involving mucus membranes or skin necrosis: Unk  Has patient had a PCN reaction that required hospitalization: Yes  Has patient had a PCN reaction occurring within the last 10 years: No  If all of the above answers are NO, then may proceed with Cephalosporin use.   ??? Doxycycline Hcl Other (See Comments)     Reaction not recalled             Medications:     Current Outpatient Medications Medication Sig Dispense Refill   ??? acetaminophen (TYLENOL) 500 MG tablet Take 1,000 mg by mouth every six (6) hours as needed for pain.     ??? ciprofloxacin HCl (CIPRO) 500 MG tablet Take 1 tablet (500 mg total) by mouth every twelve (12) hours for 10 days. 20 tablet 0   ??? cyanocobalamin 1,000 mcg/mL injection Inject 1 mL (1,000 mcg total) under the skin every thirty (30) days. 4 mL 2   ??? cyclobenzaprine (FLEXERIL) 5 MG tablet Take 5 mg by mouth every eight (8) hours as needed.     ??? famotidine (PEPCID) 40 MG tablet Take 1/2 tablet (20 mg total) by mouth Two (2) times a day. 90 each 3   ??? ferrous gluconate 324 MG tablet Take 324 mg by mouth daily.     ??? gabapentin (NEURONTIN) 800 MG tablet Take 800 mg by mouth Four (4) times a day with a meal and nightly.      ??? hydroxyzine (ATARAX) 50 mg capsule/tablet Take 1 each (50 mg total) by mouth Three (3) times a day as needed. 90 tablet 5   ??? magnesium oxide (MAG-OX) 400 mg (241.3 mg magnesium) tablet Take 2 tablets (800 mg total) by mouth Two (2) times a day. 120 tablet 5   ??? metroNIDAZOLE (FLAGYL) 500 MG tablet Take 1 tablet (500 mg total) by mouth Three (3) times a day for 10 days. 30 tablet 0   ??? milnacipran (SAVELLA) 25 mg Tab Take 25 mg by mouth Two (2) times a day.     ??? nicotine polacrilex (NICORETTE MINI) lozenge 4 MG Apply 1 lozenge (4 mg total) to cheek every two (2) hours as needed for smoking cessation. 72 each 0   ??? nicotine polacrilex (NICORETTE) 4 MG gum Apply 1 each (4 mg total) to cheek every two (2) hours as needed for smoking cessation. 110 each 0   ??? omeprazole (PRILOSEC) 40 MG capsule Take 40 mg by mouth daily.     ??? pantoprazole (PROTONIX) 40 MG tablet Take 1 tablet (40 mg total) by mouth daily. 90 tablet 1   ??? potassium chloride (KLOR-CON) 20 MEQ CR tablet Take 1 tablet (20 mEq total) by mouth daily. 30 tablet 5   ??? pramipexole (MIRAPEX) 0.75 MG tablet Take 0.75 mg by mouth nightly.     ??? oxyCODONE (ROXICODONE) 5 MG immediate release tablet Take 1 tab every 6 hrs prn pain 5 tablet 0     No current facility-administered medications for this visit.              Physical Exam:     Physical exam was deferred in the setting of telephone visit.          Labs, Data & Indices:     Lab Review:   Lab Results   Component Value Date    WBC 35.5 (H) 05/06/2019    WBC 8.6 02/20/2014    RBC 3.40 (L) 05/06/2019  RBC 4.09 02/20/2014    HGB 8.9 (L) 05/06/2019    HGB 11.6 (L) 02/20/2014     Lab Results   Component Value Date    AST 14 05/04/2019    AST 21 02/18/2014    ALT 7 05/04/2019    ALT 15 02/18/2014    BUN 10 05/06/2019    BUN 8 03/18/2014    Creatinine 0.83 05/06/2019    Creatinine 0.86 03/18/2014    CO2 19.0 (L) 05/06/2019    CO2 27 03/18/2014    Albumin 2.8 (L) 05/04/2019    Albumin 3.9 03/18/2014    Calcium 8.4 (L) 05/06/2019    Calcium 7.3 (L) 03/18/2014     Lab Results   Component Value Date    TSH 4.164 (H) 11/20/2018    TSH 4.50 (H) 03/18/2014      12/25/17   HBsAg neg   HBcAb total neg  HBsAb neg  Quantiferon gold neg   TPMT normal  ............................................................................................................................................Marland Kitchen          Assessment & Recommendations:     Mary Hawkins is a 38 y.o. female with history of stricturing??ileal Crohn's disease??s/p ileocecal resection, mesh retropexy for rectal prolapse, incidental Meckel's diverticulum s/p resection 11/13/13 and now s/p resection 10 inches small bowel on 04/25/19 after found to have severe inflammation/nontraversable stenosis at ileocolonic anastomosis despite ustekinumab therapy. Her surgery was complicated by anastomotic leak s/p drainage by VIR and antibiotics. She reports ongoing GI symptoms today and is frequently asking for pain medications. I told her that it is not my practice to prescribe narcotics and that if her pain was that severe in the post-operative period that she needed to present to ED for evaluation. Today we also discussed the importance of early initiation of medical therapy in the post-op period after cleared by Dr. Epifania Gore. She clearly had disease progression on ustekinumab therapy although there were definitely several lapses in therapy d/t compliance issues. We have the most evidence for use of infliximab in combination with azathioprine to prevent post-operative recurrence of Crohn's disease so I think this is best option for Mary Hawkins given that she has quite severe disease phenotype and has already been exposed to or failed adalimumab and ustekinumab. Infusion-based therapy will also be beneficial from a compliance standpoint and laboratory monitoring standpoint as we will know if she does not get her infusions. She wanted me to guarantee that her abdomen would become less swollen with the infliximab and we discussed that I cannot guarantee that the medication would do that; she has had significant weight gain in the past year that I have not been Hawkins to clearly explain so I did recommend that she follow up with PCP to evaluate for other causes of weight gain and help to start weight management program. We have previously discussed risks/benefits of infliximab + azathioprine therapy and written information sent to her again today. She understands that I can prescribe infliximab at [facility] but she states that she will not come to [facility] for infusions. We will see if we are Hawkins to prescribe at a [facility] infusion center closer to home but discussed with patient that I cannot guarantee that this will be an option. If it is not, then she will need to see a local GI who is Hawkins to prescribe infliximab for her in [place] if she is unable/unwilling to travel to [facility].     Again discussed importance of tobacco cessation. This is as important if not more important than starting a  new medication for her Crohn's disease. She says she is motivated to quit smoking but stressors have caused her to be unable to do so.    PLAN:  1. Crohn's disease  - f/u with Dr. Epifania Gore next week; if worsening pain in meantime needs to go back to ED but I will not give her narcotic pain medications   - plan to start infliximab 5 mg/kg every 8 weeks (after loading) and azathioprine 150 mg/day (~2 mg/kg) after cleared by surgery; discussed that she needs to get pre-biologic labs drawn after surgery visit including HBV serologies and Quantiferon gold (orders have been placed)  - discussed that monitoring labs can be drawn at the time of her infusions  - trial of hyoscyamine for abdominal cramping  - Lomotil as needed for diarrhea; she has adequate supply and does not need refill  - discussed importance of tobacco cessation as below     2. Mood disorder, chronic pain, insomnia  - she is part of IBD collaborative care program and think that she would benefit from seeing psychiatry and psychologist  - her neurologist is now prescribing Savella so we will no longer prescribe Cymbalta  - discussed that we stopped Remeron d/t weight gain so she should not restart; will trial trazodone qhs instead  - she is using hydroxyzine as needed for anxiety  - will need to use caution with other centrally acting/anticholinergic medications d/t lengthy medication list    3. Weight gain - I do not have great explanation for this but she has clearly gained weight in the past year that I have known her. Imaging does not suggest ascites, seems to be more adipose tissue. Discussed that if she is feeling better from IBD perspective and also if quits smoking, weight could increase is eating more, etc. I suggested that she continue to monitor weight and follow up with PCP for ongoing work up and weight management.     4. Prevention  - recommend annual influenza vaccine; received 05/06/19   - Prevnar 01/19/18, Pneumovax 10/30/13  - pap; she has appt with Gynecology; also had prior adnexal cyst/mass but it appears that this has resolved   - need to find recent DEXA report from [facility], update vitamin D level with next labs - CRC surveillance - needs post-op colonoscopy in 10/2019    5. Follow up in 2 months or sooner as needed  --------------------------------------------  Mardee Postin, M.D.  [facility] Gastroenterology & Hepatology  Multidisciplinary Inflammatory Bowel Disease Clinic     I spent 25 minutes on the phone with the patient. I spent an additional 30 minutes on pre- and post-visit activities.     The patient was physically located in [place] or a state in which I am permitted to provide care. The patient and/or parent/guardian understood that s/he may incur co-pays and cost sharing, and agreed to the telemedicine visit. The visit was reasonable and appropriate under the circumstances given the patient's presentation at the time.    The patient and/or parent/guardian has been advised of the potential risks and limitations of this mode of treatment (including, but not limited to, the absence of in-person examination) and has agreed to be treated using telemedicine. The patient's/patient's family's questions regarding telemedicine have been answered.     If the visit was completed in an ambulatory setting, the patient and/or parent/guardian has also been advised to contact their provider???s office for worsening conditions, and seek emergency medical treatment and/or call 911 if the patient deems  either necessary.

## 2019-05-15 ENCOUNTER — Other Ambulatory Visit: Payer: Self-pay | Admitting: Neurology

## 2019-05-15 NOTE — Unmapped (Addendum)
1. Surgery follow up next week  2. Make sure you get labs drawn after your visit, I placed the orders  3. We will look into whether we are able to order Remicade closer to home. If we cannot you will either have to come to [facility] or find a GI doctor locally that is willing to prescribe to a local infusion center (not all infusion centers will accept my orders)  4. After cleared by surgery, we will have you start Remicade and azathioprine (Imuran) 150 mg/daily.   5. Trial of hyoscyamine (Levsin) for abdominal pain; if pain worsens, you need to go to ED  6. Trial of trazodone for sleep  7. Please make an appointment with your PCP about weight gain       infliximab  Pronunciation:  in FLIX ih mab  Brand:  Inflectra, Remicade, Renflexis  What is the most important information I should know about infliximab?  Using infliximab may increase your risk of developing certain types of cancer, including a rare fast-growing type of lymphoma that can be fatal. Ask your doctor about your specific risk.  Infliximab affects your immune system. You may get infections more easily, even serious or fatal infections. Before you start using infliximab, your doctor may perform tests to make sure you do not have certain infections.  Call your doctor if you have a fever, tiredness, flu symptoms, cough, or skin sores.  What is infliximab?  Infliximab reduces the effects of a substance in the body that can cause inflammation.  Infliximab is used to treat rheumatoid arthritis, psoriatic arthritis, ankylosing spondylitis, and severe or disabling plaque psoriasis in adults.  Infliximab is also used to treat ulcerative colitis or Crohn's disease in adults and children at least 41 years old.  Infliximab is often used when other medicines have not been effective.  Infliximab may also be used for purposes not listed in this medication guide.  What should I discuss with my healthcare provider before receiving infliximab?  You should not be treated with infliximab if you are allergic to it.  Tell your doctor if you have ever had tuberculosis or if anyone in your household has tuberculosis. Also tell your doctor if you have recently traveled. Tuberculosis and some fungal infections are more common in certain parts of the world, and you may have been exposed during travel.  Tell your doctor if you have ever had:  ?? an active infection (fever, cough, flu symptoms, open sores or skin wounds);  ?? heart failure or other heart problems;  ?? diabetes;  ?? a weak immune system;  ?? liver failure, hepatitis B, or other liver problems;  ?? chronic obstructive pulmonary disease (COPD);  ?? heart problems;  ?? cancer;  ?? seizures;  ?? numbness or tingling anywhere in your body;  ?? a nerve-muscle disorder, such as multiple sclerosis or Guillain-Barr?? syndrome;  ?? phototherapy for psoriasis;  ?? vaccination with BCG (Bacille Calmette-Gu??rin); or  ?? if you are scheduled to receive any vaccines.  Make sure your child is current on all vaccines before he or she starts treatment with infliximab.  Infliximab may cause a rare type of lymphoma (cancer) of the liver, spleen, and bone marrow that can be fatal. This has occurred mainly in teenagers and young men with Crohn's disease or ulcerative colitis. However, anyone with an inflammatory autoimmune disorder may have a higher risk of lymphoma. Talk with your doctor about your own risk.  Infliximab may cause other types of cancer, such as  skin cancer or cancer of the cervix. Ask your doctor about this risk.  If you use infliximab while you are pregnant, make sure any doctor caring for your new baby knows that you used the medicine during pregnancy. Being exposed to infliximab in the womb could affect your baby's vaccination schedule during the first 6 months of life.  You should not breast-feed while you are receiving infliximab.  Infliximab is not for use in children younger than 42 years old.  How is infliximab given?  Before you start treatment with infliximab, your doctor may perform tests to make sure you do not have tuberculosis or other infections.  Infliximab is given as an infusion into a vein. A healthcare provider will give you this injection.  This medicine must be given slowly, and the infusion can take about 2 hours to complete.  You may be watched closely after receiving infliximab, to make sure the medicine has not caused any serious side effects.  Infliximab affects your immune system. You may get infections more easily, even serious or fatal infections. Your doctor will need to examine you on a regular basis, and you may need frequent TB tests.  Serious infections may be more likely in older adults.  If you need surgery, tell the surgeon ahead of time that you are using infliximab.  If you've ever had hepatitis B, using infliximab can cause this virus to become active or get worse. You may need frequent liver function tests while using this medicine and for several months after you stop.  What happens if I miss a dose?  Call your doctor for instructions if you miss an appointment for your infliximab injection.  What happens if I overdose?  Seek emergency medical attention or call the Poison Help line at [phone].  What should I avoid while receiving infliximab?  Avoid activities that may increase your risk of bleeding injury.  Do not receive a live vaccine while using infliximab, or you could develop a serious infection. Live vaccines include measles, mumps, rubella (MMR), polio, rotavirus, typhoid, yellow fever, varicella (chickenpox), and zoster (shingles).  What are the possible side effects of infliximab?  Some side effects may occur during the injection. Tell your caregiver if you feel dizzy, nauseated, light-headed, itchy or tingly, short of breath, or have a headache, fever, chills, muscle or joint pain, pain or tightness in your throat, chest pain, or trouble swallowing during the injection.  Infusion reactions may also occur within 1 or 2 hours after injection.  Get emergency medical help if you have signs of an allergic reaction: hives; chest pain, difficult breathing; fever, chills, severe dizziness; swelling of your face, lips, tongue, or throat.  Serious and sometimes fatal infections may occur during treatment with infliximab. Call your doctor right away if you have signs of infection such as: fever, extreme tiredness, flu symptoms, cough, or skin symptoms (pain, warmth, or redness).  Also call your doctor if you have:  ?? skin changes, new growths on the skin;  ?? pale skin, easy bruising or bleeding;  ?? delayed allergic reaction (up to 12 days after receiving infliximab) --fever, sore throat, trouble swallowing, headache, joint or muscle pain, skin rash, or swelling in your face or hands;  ?? liver problems --stomach pain (upper right side), tiredness, dark urine, jaundice (yellowing of the skin or eyes);  ?? lupus-like syndrome --joint pain or swelling, chest discomfort, feeling short of breath, skin rash on your cheeks or arms (worsens in sunlight);  ?? nerve problems --  numbness or tingling, problems with vision, or weak feeling in your arms or legs, seizure;  ?? new or worsening psoriasis --skin redness or scaly patches, raised bumps filled with pus;  ?? signs of heart failure --shortness of breath with swelling of your ankles or feet, rapid weight gain;  ?? signs of lymphoma --fever, night sweats, weight loss, stomach pain or swelling, chest pain, cough, trouble breathing, swollen glands (in your neck, armpits, or groin); or  ?? signs of tuberculosis --fever, cough, night sweats, loss of appetite, weight loss, feeling constantly tired.  Common side effects may include:  ?? stuffy nose, sinus pain;  ?? fever, chills, sore throat;  ?? cough, chest pain, shortness of breath;  ?? headache, feeling light-headed;  ?? rash, itching; or  ?? stomach pain.  This is not a complete list of side effects and others may occur. Call your doctor for medical advice about side effects. You may report side effects to FDA at 1-800-FDA-1088.  What other drugs will affect infliximab?  Tell your doctor about all your other medicines, especially:  ?? abatacept;  ?? anakinra;  ?? tocilizumab;  ?? any biologic medications to treat your condition--adalimumab, certolizumab, etanercept, golimumab, natalizumab, rituximab, and others; or  ?? any other medicines to treat Crohn's disease, ulcerative colitis, rheumatoid arthritis, ankylosing spondylitis, psoriatic arthritis, or psoriasis.  This list is not complete. Other drugs may affect infliximab, including prescription and over-the-counter medicines, vitamins, and herbal products. Not all possible drug interactions are listed here.  Where can I get more information?  Your pharmacist can provide more information about infliximab.  Remember, keep this and all other medicines out of the reach of children, never share your medicines with others, and use this medication only for the indication prescribed.   Every effort has been made to ensure that the information provided by [employer] ('[employer]') is accurate, up-to-date, and complete, but no guarantee is made to that effect. Drug information contained herein may be time sensitive. [employer] information has been compiled for use by healthcare practitioners and consumers in the [place] and therefore [employer] does not warrant that uses outside of the [place] are appropriate, unless specifically indicated otherwise. [employer]'s drug information does not endorse drugs, diagnose patients or recommend therapy. [employer]'s drug information is an Investment [employer], corporate to assist licensed healthcare practitioners in caring for their patients and/or to serve consumers viewing this service as a supplement to, and not a substitute for, the expertise, skill, knowledge and judgment of healthcare practitioners. The absence of a warning for a given drug or drug combination in no way should be construed to indicate that the drug or drug combination is safe, effective or appropriate for any given patient. [employer] does not assume any responsibility for any aspect of healthcare administered with the aid of information [employer] provides. The information contained herein is not intended to cover all possible uses, directions, precautions, warnings, drug interactions, allergic reactions, or adverse effects. If you have questions about the drugs you are taking, check with your doctor, nurse or pharmacist.  Copyright [phone] [employer] Version: 19.01. Revision date: 02/06/2018.  Care instructions adapted under license by [facility]. If you have questions about a medical condition or this instruction, always ask your healthcare professional. [employer] disclaims any warranty or liability for your use of this information.       Patient Education        adalimumab  Pronunciation:  AY da LIM ue mab  Brand:  Humira  What is the most important information I should know about adalimumab?  This medicine affects your immune system. You may get infections more easily, even serious or fatal infections.  Before or during treatment with adalimumab, tell your doctor if you have signs of infection such as fever, chills, aches, tiredness, cough, skin sores, diarrhea, or burning when you urinate.  What is adalimumab?  Adalimumab reduces the effects of a substance in the body that can cause inflammation.  Adalimumab is used to treat many inflammatory conditions in adults, such as ulcerative colitis, rheumatoid arthritis, psoriatic arthritis, ankylosing spondylitis, plaque psoriasis, and a skin condition called hidradenitis suppurativa.  Adalimumab is also used in adults and children to treat Crohn's disease, juvenile idiopathic arthritis, or uveitis.  Adalimumab may also be used for purposes not listed in this medication guide.  What should I discuss with my healthcare provider before using adalimumab?  You should not use this medicine if you are allergic to adalimumab.  Before you start using adalimumab, tell your doctor if you have signs of infection --fever, chills, sweats, muscle aches, tiredness, cough, bloody mucus, skin sores, diarrhea, burning when you urinate, or feeling constantly tired.  Adalimumab should not be given to a child younger than 33 years old (or 92 years old if treating Crohn's disease).  Children using adalimumab should be current on all childhood immunizations before starting treatment.  Tell your doctor if you have ever had:  ?? tuberculosis (or if anyone in your household has tuberculosis);  ?? cancer;  ?? hepatitis B (adalimumab can cause hepatitis B to come back or get worse);  ?? diabetes;  ?? congestive heart failure;  ?? any numbness or tingling, or a nerve-muscle disorder such as multiple sclerosis or Guillain-Barre syndrome;  ?? an allergy to latex rubber;  ?? if you are scheduled to have major surgery; or  ?? if you have recently received or are scheduled to receive any vaccine.  Tell your doctor where you live and if you have recently traveled or plan to travel. You may be exposed to infections that are common to certain areas of the world.  Adalimumab may cause a rare type of lymphoma (cancer) of the liver, spleen, and bone marrow that can be fatal. This has occurred mainly in teenagers and young men with Crohn's disease or ulcerative colitis. However, anyone with an inflammatory autoimmune disorder may have a higher risk of lymphoma. Talk with your doctor about your own risk.  It is not known whether this medicine will harm an unborn baby. Tell your doctor if you are pregnant. Make sure any doctor caring for your newborn baby knows if you used adalimumab while you were pregnant.  It may not be safe to breast-feed a baby while you are using this medicine. Ask your doctor about any risks.  How should I use adalimumab?  Follow all directions on your prescription label and read all medication guides or instruction sheets. Use the medicine exactly as directed.  Adalimumab is injected under the skin. A healthcare provider will teach you how to properly use this medicine by yourself.  Do not start using adalimumab if you have any signs of an infection. Call your doctor for instructions.  Read and carefully follow any instruction sheet provided with your medicine. Do not use adalimumab if you do not understand the instructions for proper use. Ask your doctor or pharmacist if you have any questions.  The dose schedule for adalimumab is highly variable and depends on  the condition you are treating. Follow your doctor's dosing instructions very carefully.  Prepare your injection only when you are ready to give it. Do not use if the medicine looks cloudy or has particles in it. Call your pharmacist for new medicine.  Adalimumab affects your immune system. You may get infections more easily, even serious or fatal infections. Your doctor will need to examine you on a regular basis.  Store this medicine in its original carton in a refrigerator. Do not freeze. If you are traveling, carefully follow all patient instructions for storing your medicine during travel.  Throw away any adalimumab that has become frozen.   Use a needle and syringe only once and then place them in a puncture-proof sharps container. Follow state or local laws about how to dispose of this container. Keep it out of the reach of children and pets.  What happens if I miss a dose?  Use the medicine as soon as you remember, and then go back to your regular injection schedule. Do not use extra medicine to make up the missed dose.  What happens if I overdose?  Seek emergency medical attention or call the Poison Help line at [phone].  What should I avoid while using adalimumab?  Do not inject adalimumab into skin that is bruised, red, tender, or hard.   Avoid being near people who are sick or have infections. Tell your doctor at once if you develop signs of infection.  Do not receive a live vaccine while using adalimumab. The vaccine may not work as well during this time, and may not fully protect you from disease. Live vaccines include measles, mumps, rubella (MMR), polio, rotavirus, typhoid, yellow fever, varicella (chickenpox), or zoster (shingles).  What are the possible side effects of adalimumab?  Get emergency medical help if you have any of these signs of an allergic reaction: hives; difficulty breathing; swelling of your face, lips, tongue, or throat.  Stop using adalimumab and call your doctor right away if you have any symptoms of lymphoma:  ?? fever, swollen glands, night sweats, general feeling of illness;  ?? joint and muscle pain, skin rash, easy bruising or bleeding;  ?? pale skin, feeling light-headed or short of breath, cold hands and feet;  ?? pain in your upper stomach that may spread to your shoulder; or  ?? loss of appetite, feeling full after eating only a small amount, weight loss.  Also call your doctor at once if you have:  ?? new or worsening psoriasis (raised, silvery flaking of the skin);  ?? liver problems --fever, body aches, tiredness, stomach pain, right-sided upper stomach pain, vomiting, loss of appetite, dark urine, clay-colored stools, jaundice (yellowing of the skin or eyes);  ?? lupus-like syndrome --joint pain or swelling, chest pain, shortness of breath, patchy skin color that worsens in sunlight;  ?? nerve problems --numbness, tingling, dizziness, vision problems, weakness in your arms or legs; or  ?? signs of tuberculosis --fever with ongoing cough, weight loss (fat or muscle).  Older adults may be more likely to develop infections or cancer while using adalimumab.  Common side effects may include:  ?? headache;  ?? cold symptoms such as stuffy nose, sinus pain, sneezing, sore throat;  ?? rash; or  ?? redness, bruising, itching, or swelling where the injection was given.  This is not a complete list of side effects and others may occur. Call your doctor for medical advice about side effects. You may report side effects to FDA at 1-800-FDA-1088.  What other drugs will affect adalimumab?  Some drugs should not be used together with adalimumab. Tell your doctor about all medicines you use, and those you start or stop using during your treatment with adalimumab, especially:  ?? abatacept, etanercept;  ?? anakinra;  ?? azathioprine, mercaptopurine; or  ?? certolizumab, golimumab, infliximab, rituximab.  This list is not complete. Other drugs may interact with adalimumab, including prescription and over-the-counter medicines, vitamins, and herbal products. Not all possible interactions are listed in this medication guide.  Where can I get more information?  Your pharmacist can provide more information about adalimumab.  Remember, keep this and all other medicines out of the reach of children, never share your medicines with others, and use this medication only for the indication prescribed.   Every effort has been made to ensure that the information provided by [employer]') is accurate, up-to-date, and complete, but no guarantee is made to that effect. Drug information contained herein may be time sensitive. [employer] information has been compiled for use by healthcare practitioners and consumers in the [place] and therefore [employer] does not warrant that uses outside of the [place] are appropriate, unless specifically indicated otherwise. [employer]'s drug information does not endorse drugs, diagnose patients or recommend therapy. [employer]'s drug information is an Investment banker, corporate to assist licensed healthcare practitioners in caring for their patients and/or to serve consumers viewing this service as a supplement to, and not a substitute for, the expertise, skill, knowledge and judgment of healthcare practitioners. The absence of a warning for a given drug or drug combination in no way should be construed to indicate that the drug or drug combination is safe, effective or appropriate for any given patient. [employer] does not assume any responsibility for any aspect of healthcare administered with the aid of information [employer] provides. The information contained herein is not intended to cover all possible uses, directions, precautions, warnings, drug interactions, allergic reactions, or adverse effects. If you have questions about the drugs you are taking, check with your doctor, nurse or pharmacist.  Copyright [phone] [employer] Version: 13.03. Revision date: 06/05/2017.  Care instructions adapted under license by [facility]. If you have questions about a medical condition or this instruction, always ask your healthcare professional. [employer] disclaims any warranty or liability for your use of this information.

## 2019-05-15 NOTE — Unmapped (Signed)
TC from pt requesting for pain meds. She says she does not necessarily need oxycodone but anything for pain. I told her I will check with MD

## 2019-05-15 NOTE — Unmapped (Signed)
Referral sent to [facility] with orders and pt demographics to [facility]   Still need to fax updated clinic note and quantiferon TB results

## 2019-05-16 MED ORDER — TRAMADOL 50 MG TABLET
ORAL_TABLET | Freq: Four times a day (QID) | ORAL | 0 refills | 5 days | PRN
Start: 2019-05-16 — End: ?

## 2019-05-16 NOTE — Unmapped (Signed)
TC to pt to advise on rx for tramadol. Advised that Dr. [name] has prescribed levsin. She said that does not work and I am not taking it. I advised her that she should at least try because this is a different situation since she is post op. She said she will try. 20tabs tramadol called in to pharmacy. No more refills

## 2019-05-20 ENCOUNTER — Telehealth: Payer: Self-pay | Admitting: *Deleted

## 2019-05-20 NOTE — Unmapped (Signed)
Behavioral Health Collaborative Care Management Psychiatric Consultation  Transition Note    Given upcoming completion of the behavioral health collaborative care management pilot program at the end of October 2020, please see recommendations below about care transition plan.      Pt is a 38 year old with a history of Crohn's disease,??chronic pain, depression and anxiety who was referred to the program in January 2020 for anxiety and longstanding depression.????While in the program, she was started on Remeron for depression augmentation with Cymbalta.  We have recommended she establish with an individual psychiatrist since May 2020 and she missed her recent appointment in September 2020 to establish care.  Our most recent medication recommendations 03/11/2019 recommended stopping Remeron given pt lack of perceived benefit and potential for this to worsen restless leg syndrome and to titrate Cymbalta further to 90 mg daily (if stopped Remeron).    Reviewed chart -- pt had surgery recently on 04/25/19 and had f/u with Dr. [name] on 05/14/19. In this note, pt informed Dr. [name] that she restarted Remeron and was no longer taking Cymbalta as her neurologist stopped it and changed her to [facility] due to RLS. She requested a refill for hydroxyzine.  Dr. [name] recommended she stop Remeron due to weight gain and started Trazodone qHS instead and continued hydroxyzine as needed for anxiety.       Recommend she establish with an individual psychiatrist   - Agree with discontinuation of Remeron given weight gain and prior lack of perceived benefit from pt  - Pt's neurologist is managing [facility].  Pt no longer on Cymbalta per neurology.   - Strongly recommend that she establish with an individual psychiatrist and reschedule her appointment with [facility] psychiatry (clinic number [phone]).  I will send a message to the front desk at the clinic requesting they reach out to assist with rescheduling her missed apt. This case review was conducted with information gathered from chart and from care manager information.  I have not examined the patient directly.  Physician should incorporate recommendations as appropriate given full current clinical picture.    Recommendations will be sent to GI provider Dr. [name] and RN patient coordinator??Neta Mends.  ??    Dr. Lonell Face was present during the case consultation review and is in agreement with above recommendations and plan.    Karie Chimera, MD  05/20/2019

## 2019-05-20 NOTE — Telephone Encounter (Signed)
Pt called need a sooner appt . Pt is  jerking not able to wait until January. Please call [phone]

## 2019-05-20 NOTE — Telephone Encounter (Signed)
Unable to get in contact with the patient due to the line being busy. I will attempt to call back tomorrow morning.

## 2019-05-21 ENCOUNTER — Ambulatory Visit
Admit: 2019-05-21 | Discharge: 2019-05-22 | Payer: MEDICAID | Attending: Obstetrics & Gynecology | Primary: Obstetrics & Gynecology

## 2019-05-21 ENCOUNTER — Ambulatory Visit: Admit: 2019-05-21 | Discharge: 2019-05-22 | Payer: MEDICAID | Attending: Surgery | Primary: Surgery

## 2019-05-21 DIAGNOSIS — K50818 Crohn's disease of both small and large intestine with other complication: Principal | ICD-10-CM

## 2019-05-21 LAB — CBC W/ AUTO DIFF
BASOPHILS ABSOLUTE COUNT: 0.1 10*9/L (ref 0.0–0.1)
BASOPHILS RELATIVE PERCENT: 0.8 %
EOSINOPHILS ABSOLUTE COUNT: 0.9 10*9/L — ABNORMAL HIGH (ref 0.0–0.4)
EOSINOPHILS RELATIVE PERCENT: 6 %
HEMOGLOBIN: 12.2 g/dL (ref 12.0–16.0)
LARGE UNSTAINED CELLS: 1 % (ref 0–4)
LYMPHOCYTES ABSOLUTE COUNT: 3.1 10*9/L (ref 1.5–5.0)
LYMPHOCYTES RELATIVE PERCENT: 21.8 %
MEAN CORPUSCULAR HEMOGLOBIN: 26 pg (ref 26.0–34.0)
MEAN CORPUSCULAR VOLUME: 90.2 fL (ref 80.0–100.0)
MEAN PLATELET VOLUME: 7.6 fL (ref 7.0–10.0)
MONOCYTES ABSOLUTE COUNT: 0.7 10*9/L (ref 0.2–0.8)
MONOCYTES RELATIVE PERCENT: 5.2 %
NEUTROPHILS ABSOLUTE COUNT: 9.2 10*9/L — ABNORMAL HIGH (ref 2.0–7.5)
NEUTROPHILS RELATIVE PERCENT: 65.1 %
PLATELET COUNT: 778 10*9/L — ABNORMAL HIGH (ref 150–440)
RED BLOOD CELL COUNT: 4.69 10*12/L (ref 4.00–5.20)
RED CELL DISTRIBUTION WIDTH: 18.8 % — ABNORMAL HIGH (ref 12.0–15.0)
WBC ADJUSTED: 14.2 10*9/L — ABNORMAL HIGH (ref 4.5–11.0)

## 2019-05-21 LAB — COMPREHENSIVE METABOLIC PANEL
ALBUMIN: 3.6 g/dL (ref 3.5–5.0)
ALKALINE PHOSPHATASE: 155 U/L — ABNORMAL HIGH (ref 38–126)
ALT (SGPT): 9 U/L (ref <35)
ANION GAP: 17 mmol/L — ABNORMAL HIGH (ref 7–15)
AST (SGOT): 18 U/L (ref 14–38)
BILIRUBIN TOTAL: 0.5 mg/dL (ref 0.0–1.2)
BLOOD UREA NITROGEN: 4 mg/dL — ABNORMAL LOW (ref 7–21)
BUN / CREAT RATIO: 4
CALCIUM: 9 mg/dL (ref 8.5–10.2)
CHLORIDE: 113 mmol/L — ABNORMAL HIGH (ref 98–107)
CO2: 15 mmol/L — ABNORMAL LOW (ref 22.0–30.0)
CREATININE: 1.04 mg/dL — ABNORMAL HIGH (ref 0.60–1.00)
EGFR CKD-EPI AA FEMALE: 79 mL/min/{1.73_m2} (ref >=60)
EGFR CKD-EPI NON-AA FEMALE: 69 mL/min/{1.73_m2} (ref >=60)
GLUCOSE RANDOM: 90 mg/dL (ref 70–99)
POTASSIUM: 3.4 mmol/L — ABNORMAL LOW (ref 3.5–5.0)
PROTEIN TOTAL: 6.3 g/dL — ABNORMAL LOW (ref 6.5–8.3)

## 2019-05-21 LAB — CHLORIDE: Chloride:SCnc:Pt:Ser/Plas:Qn:: 113 — ABNORMAL HIGH

## 2019-05-21 LAB — HEMOGLOBIN: Hemoglobin:MCnc:Pt:Bld:Qn:: 12.2

## 2019-05-21 LAB — C-REACTIVE PROTEIN: C reactive protein:MCnc:Pt:Ser/Plas:Qn:: 17.9 — ABNORMAL HIGH

## 2019-05-21 LAB — SMEAR REVIEW

## 2019-05-21 LAB — MAGNESIUM: Magnesium:MCnc:Pt:Ser/Plas:Qn:: 1.7

## 2019-05-21 NOTE — Unmapped (Signed)
GI SURGERY CLINIC  PROGRESS NOTE    Patient Name: Mary Hawkins  Patient Age: 38 y.o.  Encounter Date: 05/21/2019    REFERRING PHYSICIAN:  [facility]  [address] [place],  [place] [address]    CONSULTING PHYSICIANS:  Patient Care Team:  Bonna Gains, MD as PCP - General (Internal Medicine)  Ottie Glazier, MSW as Case Manager (Social Work)    PRIMARY CARE PROVIDER:  Marlene Bast, MD      ASSESSMENT: Vern Prestia is a 38 y.o. female with PMH of Crohn's disease c/b stricture at previous ileocolonic anastomosis now s/p ileocecectomy on 04/25/19 complicated by anastomotic leak managed by IR aspiration of associated abscess. She continues to recover slowly after surgery but has persitently poor appetite and abdominal pain.     PLAN:   - CT abdomen/pelvis with PO and IV contrast to evaluate persistent pain and abdominal distension    05/22/2019    I saw and evaluated the patient, participating in the all the portions of the service.  I reviewed the resident's note.  I agree with the resident's findings and plan.     Rae Halsted MD      REASON FOR VISIT: Post-op    HPI: Mary Hawkins is a 38 y.o. female who is seen for follow-up of ileocecectomy. The patient was found to have recurrence of disease at her prior ileocolonic anastomosis and she was subsequently taken to the OR for repeat ileocolonic resection on 04/25/19. She was re-admitted from 9/25-9/28 with CT abdomen/pelvis concerning for contained anastomotic leak. She underwent IR aspiration of the contained leak and was discharged home with a course of PO antibiotics. She reports that she continues to have diffuse abdominal pain and has called frequently for pain medication refills. However, she notes the pain is worse in the RLQ with certain movements. She has about 10 loose bowel movements per day which is similar to her baseline prior to this most recent surgery. She has had no issues drinking but has not been eating much solid food as she reports very poor appetite.        REVIEW OF SYSTEMS:   Remainder of a 10 system review of systems is negative.     Objective     BP 119/85  - Pulse 120  - Temp 37.3 ??C (Oral)  - Ht 160 cm (5' 3)  - Wt 73.2 kg (161 lb 6 oz)  - LMP 04/24/2019  - BMI 28.59 kg/m??     Wt Readings from Last 6 Encounters:   05/21/19 73.2 kg (161 lb 6 oz)   05/04/19 79.9 kg (176 lb 2.4 oz)   04/25/19 84 kg (185 lb 3 oz)   04/09/19 76.8 kg (169 lb 5 oz)   03/15/19 72.6 kg (160 lb)   11/20/18 79.7 kg (175 lb 9.6 oz)       PHYSICAL EXAMINATION:   General Appearance: Middle-aged female in no acute distress. Alert and oriented x 3.   Pulmonary: Normal respiratory effort on room air.  Abdomen: Soft, mildly distended, moderate diffuse tenderness to palpation throughout. No rebound, no guarding, no masses, no hepatosplenomegaly, no hernias. Well-healed pfannenstiel incision.   Extremities: Normal gait. Extremities without clubbing, cyanosis or edema.  Neurologic:  No motor abnormalities noted. Sensation grossly intact.  Skin:  Skin color normal. No rashes or lesions.     DIAGNOSTIC STUDIES:   Final Diagnosis A: Terminal ileum and colon, segmental resection  - Anastomosed  segment of terminal ileum and colon with moderately active chronic enteritis and colitis with transmural inflammation, multifocal mucosal ulcerations, and focal perforation, clinically history of Crohn's disease  - Margins appear histologically viable and negative for dysplasia or malignancy   - No CMV viral cytopathic effect, granuloma, or dysplasia identified  - One lymph nodes, negative for malignancy (0/1)  - Separate unoriented segment of terminal ileum with moderately active chronic enteritis and acute serositis; negative for dysplasia or malignancy       Jenna Luo, MD  General Surgery, PGY-4  [phone]

## 2019-05-21 NOTE — Unmapped (Signed)
You will get a call to Schedule CT [phone] option 1  If you have any questions or concerns regarding your appointment today please call   Everlene Balls, RN For Dr. Rae Halsted and Dr. Marcella Dubs at [phone] or [phone]. Fax: [phone]. Clinic# [phone]    For emergencies at night or on the weekends, please call [phone] and ask for the surgery resident on call.

## 2019-05-21 NOTE — Telephone Encounter (Signed)
Spoke with the patient has been scheduled for 06-10-2019 @ 1:45 pm. She was very appreciative for the call.

## 2019-05-22 DIAGNOSIS — K50818 Crohn's disease of both small and large intestine with other complication: Principal | ICD-10-CM

## 2019-05-22 LAB — HEPATITIS B SURFACE ANTIBODY QUANT: Hepatitis B virus surface Ab:ACnc:Pt:Ser:Qn:: <8

## 2019-05-22 LAB — VITAMIN D, TOTAL (25OH): Lab: 17.1 — ABNORMAL LOW

## 2019-05-22 LAB — HEPATITIS B CORE TOTAL ANTIBODY: Hepatitis B virus core Ab:PrThr:Pt:Ser/Plas:Ord:IA: NONREACTIVE

## 2019-05-22 NOTE — Unmapped (Signed)
Received message from [facility] - they need TB/hep Bsurface antigen results faxed to [phone]  They have also tried to reach patient 3 times and ask we contact her to have her call to schedule her infusion.

## 2019-05-23 DIAGNOSIS — K50019 Crohn's disease of small intestine with unspecified complications: Principal | ICD-10-CM

## 2019-05-23 DIAGNOSIS — R2681 Unsteadiness on feet: Principal | ICD-10-CM

## 2019-05-23 LAB — HEPATITIS B SURFACE ANTIGEN: Hepatitis B virus surface Ag:PrThr:Pt:Ser:Ord:: NONREACTIVE

## 2019-05-24 LAB — TB AG2 VALUE: Lab: 0.05

## 2019-05-24 LAB — QUANTIFERON TB GOLD PLUS
QUANTIFERON TB GOLD PLUS: NEGATIVE
QUANTIFERON TB NIL VALUE: 0.03 [IU]/mL

## 2019-05-24 LAB — QUANTIFERON MITOGEN: Lab: 9.97

## 2019-05-24 LAB — TB MITOGEN VALUE: Lab: 10

## 2019-05-24 LAB — TB NIL VALUE: Lab: 0.03

## 2019-05-24 LAB — TB AG1 VALUE: Lab: 0.04

## 2019-05-24 MED ORDER — ERGOCALCIFEROL (VITAMIN D2) 1,250 MCG (50,000 UNIT) CAPSULE: 50000 [IU] | capsule | 0 refills | 56 days | Status: AC

## 2019-05-24 NOTE — Unmapped (Signed)
Called pt as Mary Hawkins has not been able to get in touch with pt and she has not read my mychart message to call [facility]. She did not answer, left VM with [facility] phone number.     Called a second number of pt -spoke with her regarding [facility] infusion in [place]. Provided address and contact info - once cleared by Dr Epifania Gore after a CT scan he requested, pt will arrange app     Called [facility] to update them on pt status of starting Remicade

## 2019-05-27 ENCOUNTER — Ambulatory Visit: Admit: 2019-05-27 | Discharge: 2019-05-28 | Payer: MEDICAID

## 2019-05-27 DIAGNOSIS — Z0000 Encounter for general adult medical examination without abnormal findings: Principal | ICD-10-CM

## 2019-05-27 DIAGNOSIS — Z8742 Personal history of other diseases of the female genital tract: Principal | ICD-10-CM

## 2019-05-27 DIAGNOSIS — K50018 Crohn's disease of small intestine with other complication: Principal | ICD-10-CM

## 2019-05-27 DIAGNOSIS — N840 Polyp of corpus uteri: Principal | ICD-10-CM

## 2019-05-27 DIAGNOSIS — N926 Irregular menstruation, unspecified: Principal | ICD-10-CM

## 2019-05-27 DIAGNOSIS — N949 Unspecified condition associated with female genital organs and menstrual cycle: Principal | ICD-10-CM

## 2019-05-27 LAB — PROLACTIN: Prolactin:MCnc:Pt:Ser/Plas:Qn:: 4.2

## 2019-05-27 LAB — FOLLICLE STIMULATING HORMONE: Follitropin:ACnc:Pt:Ser/Plas:Qn:: 9.4

## 2019-05-27 LAB — THYROID STIMULATING HORMONE: Thyrotropin:ACnc:Pt:Ser/Plas:Qn:: 3.463 — ABNORMAL HIGH

## 2019-05-27 LAB — ESTRADIOL LEVEL: Estradiol:MCnc:Pt:Ser/Plas:Qn:: 183.6

## 2019-05-27 MED ORDER — ERGOCALCIFEROL (VITAMIN D2) 1,250 MCG (50,000 UNIT) CAPSULE: 50000 [IU] | capsule | 0 refills | 56 days | Status: AC

## 2019-05-27 NOTE — Unmapped (Signed)
Assessment  Problem List Items Addressed This Visit        Digestive    Crohn's disease of small intestine with other complication (CMS-HCC)      Other Visit Diagnoses     Irregular menses    -  Primary    Encounter for preventive health examination        Adnexal cyst        Endometrial polyp        History of endometriosis               Symptoms likely GI in origin; there could be a component of her chronic abdominal pain 2/2 to endometriosis  Large, asymptomatic endometrial polyp  H/O functional ovarian cysts   Ovulatory dysfunction         Plan    Endometrial Polyp  - Interested in pursuing operative hysteroscopy w/polypectomy/D&C.  She was counseled about alternative diagnostic or treatment approaches, and informed consent regarding expected treatment success and possible complications. She was also informed of possible need to abandon or prematurely stop a procedure due to fluid overload. Also, since uterine perforation is a possible complication, she was counseled re: a possible laparoscopy or laparotomy if it becomes necessary to rule out visceral or vascular injury.Education materials were provided  - Will consider Mirena placement at the time of hysteroscopy for medical management of the endometriosis and to provide endometrial protection  - Follow-up 4-6 weeks post-op    Oligomenorrhea  - Obtain TSH, FSH, estradiol, free/total testosterone, 17-OH P, prolactin       Will review previous records           Patient ID: Mary Hawkins, female   DOB: 06-11-1981, 38 y.o.   MRN: [id number]    Chief Complaint   Patient presents with   ??? Ovarian Cyst       HPI  Mary Hawkins is a 38 y.o. female [phone] with a h/o  endometriosis (diagnosed by laparoscopy in 2010), and IBD (both Crohns and ulcerative colitis) s/p bowel resection in 04/2019 c/b fistula & abscess formation, who presents for abdominal pain, cramping, and irregular menses. She is seen at the request of Thurmon Fair, MD. Mary Hawkins has a 2-year history of abdominal pain bilaterally that she describes as pulsating.  The pain has no relation to her bowel movements or her menstrual periods.  The pain  is periumbilical on the right and left side, radiating to sides.  No pelvic or suprapubic pain.  The pain is constant, but once every few months will intensify to a debilitating level, at which point she typically presents to the [facility] ED.  She has been told that transvaginal U/S during these visits have shown cysts on her ovaries. She also complains of early satiety and increased abdominal distention, with significant abdominal girth.    She also complains of irregular meses, which she says has been present since menarche at age 6, but has recently gotten worse.  She can sometimes go 6+ months without menstruating.  Most recent LMP 04/25/2019.  Periods last 1-3 days with moderate flow, changes pads a few times per day.  She endorses cramping with periods.    She has not seen a gynecologist in the last ten years, no pap smears in the last ten years.  Unknown history of abnormal pap smears.  Has never tested positive for STIs.  Has one prior gyn surgery of diagnostic laparoscopy in June 2010 for diagnosis of endometriosis.  Had some resolution  of symptoms after surgery, but the resolution was transient and symptoms quickly returned that year.     She is not sexually active.  Does not desire future fertility.    Past Medical History:   Diagnosis Date   ??? Anxiety    ??? Arthritis    ??? Back pain    ??? Crohn disease (CMS-HCC)    ??? Eczema    ??? Endometriosis    ??? Fibromyalgia    ??? GERD (gastroesophageal reflux disease)    ??? Headache(784.0)    ??? Hypertension    ??? Ulcerative colitis (CMS-HCC)        Past Surgical History:   Procedure Laterality Date   ??? BREAST RECONSTRUCTION  2004   ??? ENDOMETRIAL ABLATION     ??? HIP SURGERY     ??? PR COLONOSCOPY FLX DX W/COLLJ SPEC WHEN PFRMD N/A 11/12/2013 Procedure: COLONOSCOPY, FLEXIBLE, PROXIMAL TO SPLENIC FLEXURE; DIAGNOSTIC, W/WO COLLECTION SPECIMEN BY BRUSH OR WASH;  Surgeon: Teodoro Spray, MD;  [facility];  Service: Gastroenterology   ??? PR COLONOSCOPY FLX DX W/COLLJ SPEC WHEN PFRMD N/A 02/20/2014    Procedure: COLONOSCOPY, FLEXIBLE, PROXIMAL TO SPLENIC FLEXURE; DIAGNOSTIC, W/WO COLLECTION SPECIMEN BY BRUSH OR WASH;  Surgeon: Billie Ruddy, MD;  Location: [facility];  Service: Gastroenterology   ??? PR COLONOSCOPY FLX DX W/COLLJ SPEC WHEN PFRMD N/A 12/27/2017    Procedure: COLONOSCOPY, FLEXIBLE, PROXIMAL TO SPLENIC FLEXURE; DIAGNOSTIC, W/WO COLLECTION SPECIMEN BY BRUSH OR WASH;  Surgeon: Bronson Curb, MD;  Location: [facility];  Service: Gastroenterology   ??? PR COLONOSCOPY FLX DX W/COLLJ SPEC WHEN PFRMD N/A 03/15/2019    Procedure: COLONOSCOPY, FLEXIBLE, PROXIMAL TO SPLENIC FLEXURE; DIAGNOSTIC, W/WO COLLECTION SPECIMEN BY BRUSH OR WASH;  Surgeon: Thurmon Fair, MD;  Location: GI PROCEDURES [facility];  Service: Gastroenterology   ??? PR REMVL COLON & TERM ILEUM W/ILEOCOLOSTOMY N/A 11/13/2013    Procedure: Ileocecectomy, meckel's diverticulectomy, mesh rectopexy;  Surgeon: Emilia Beck, MD;  Location: MAIN OR [place];  Service: Gastrointestinal   ??? PR REMVL COLON & TERM ILEUM W/ILEOCOLOSTOMY N/A 04/25/2019    Procedure: Colectomy, Partial, With Removal Of Terminal Ileum With Ileocolostomy;  Surgeon: [name], MD;  Location: MAIN OR [place];  Service: Gastrointestinal   ??? PR UPPER GI ENDOSCOPY,BIOPSY N/A 11/12/2013    Procedure: UGI ENDOSCOPY; WITH BIOPSY, SINGLE OR MULTIPLE;  Surgeon: Teodoro Spray, MD;  Location: [facility];  Service: Gastroenterology   ??? PR UPPER GI ENDOSCOPY,BIOPSY N/A 12/27/2017    Procedure: UGI ENDOSCOPY; WITH BIOPSY, SINGLE OR MULTIPLE;  Surgeon: Bronson Curb, MD;  Location: [facility];  Service: Gastroenterology ??? PR UPPER GI ENDOSCOPY,BIOPSY N/A 03/15/2019    Procedure: UGI ENDOSCOPY; WITH BIOPSY, SINGLE OR MULTIPLE;  Surgeon: Thurmon Fair, MD;  [facility]: [facility];  Service: Gastroenterology   ??? RECTAL PROLAPSE REPAIR         Family History   Problem Relation Age of Onset   ??? Esophageal cancer Maternal Grandfather    ??? Cancer Maternal Grandfather         Gastric cancer       Social History  Social History     Tobacco Use   ??? Smoking status: Current Every Day Smoker     Packs/day: 0.50     Years: 15.00     Pack years: 7.50     Types: Cigarettes   ??? Smokeless tobacco: Never  Used   ??? Tobacco comment: Pt smokes 10cpd, pt expressed desire to minimize tobacco use    Substance Use Topics   ??? Alcohol use: Yes     Frequency: 2-4 times a month     Drinks per session: 1 or 2     Comment: socially   ??? Drug use: No       Allergies   Allergen Reactions   ??? Penicillins Hives     Has patient had a PCN reaction causing immediate rash, facial/tongue/throat swelling, SOB or lightheadedness with hypotension: Yes  Has patient had a PCN reaction causing severe rash involving mucus membranes or skin necrosis: Unk  Has patient had a PCN reaction that required hospitalization: Yes  Has patient had a PCN reaction occurring within the last 10 years: No  If all of the above answers are NO, then may proceed with Cephalosporin use.  Has patient had a PCN reaction causing immediate rash, facial/tongue/throat swelling, SOB or lightheadedness with hypotension: Yes  Has patient had a PCN reaction causing severe rash involving mucus membranes or skin necrosis: Unk  Has patient had a PCN reaction that required hospitalization: Yes  Has patient had a PCN reaction occurring within the last 10 years: No  If all of the above answers are NO, then may proceed with Cephalosporin use.   ??? Doxycycline Hcl Other (See Comments)     Reaction not recalled       Current Outpatient Medications   Medication Sig Dispense Refill ??? acetaminophen (TYLENOL) 500 MG tablet Take 1,000 mg by mouth every six (6) hours as needed for pain.     ??? cyanocobalamin 1,000 mcg/mL injection Inject 1 mL (1,000 mcg total) under the skin every thirty (30) days. 4 mL 2   ??? famotidine (PEPCID) 40 MG tablet Take 1/2 tablet (20 mg total) by mouth Two (2) times a day. 90 each 3   ??? ferrous gluconate 324 MG tablet Take 324 mg by mouth daily.     ??? gabapentin (NEURONTIN) 800 MG tablet Take 800 mg by mouth Four (4) times a day with a meal and nightly.      ??? hydroxyzine (ATARAX) 50 mg capsule/tablet Take 1 each (50 mg total) by mouth Three (3) times a day as needed. 90 tablet 5   ??? magnesium oxide (MAG-OX) 400 mg (241.3 mg magnesium) tablet Take 2 tablets (800 mg total) by mouth Two (2) times a day. 120 tablet 5   ??? milnacipran (SAVELLA) 25 mg Tab Take 25 mg by mouth Two (2) times a day.     ??? pantoprazole (PROTONIX) 40 MG tablet Take 1 tablet (40 mg total) by mouth daily. 90 tablet 1   ??? potassium chloride (KLOR-CON) 20 MEQ CR tablet Take 1 tablet (20 mEq total) by mouth daily. 30 tablet 5   ??? pramipexole (MIRAPEX) 0.75 MG tablet Take 0.75 mg by mouth nightly.     ??? traZODone (DESYREL) 50 MG tablet Take 1 tablet (50 mg total) by mouth nightly. 30 tablet 2   ??? nicotine polacrilex (NICORETTE) 4 MG gum Apply 1 each (4 mg total) to cheek every two (2) hours as needed for smoking cessation. (Patient not taking: Reported on 05/27/2019) 110 each 0     No current facility-administered medications for this visit.        Review of Systems  Review of Systems  Constitutional: negative for fatigue and weight loss  Respiratory: negative for cough and wheezing  Cardiovascular: negative for chest  pain, fatigue and palpitations  Gastrointestinal: positive for abdominal pain, increased abdominal girth, early satiety, no change in bowel habits  Genitourinary:negative, no dysuria  Integument/breast: negative for nipple discharge  Musculoskeletal: negative for myalgias Neurological: negative for gait problems and tremors  Behavioral/Psych: negative for abusive relationship, depression  Endocrine: negative for temperature intolerance        Blood pressure 120/74, pulse 106, height 160 cm (5' 3), weight 72.1 kg (158 lb 14.4 oz), last menstrual period 04/25/2019, not currently breastfeeding.    Physical Exam  Physical Exam  General:   alert   Skin:   no rash or abnormalities   Abdomen:  no organomegaly, soft abdomen; tender to palpation in all four quadrants on light palpation (R > L); well-healed low transverse abdominal scar   Pelvis:  External genitalia: normal general appearance  Urinary system: urethral meatus normal and bladder without fullness, nontender  Vaginal: normal without tenderness, induration or masses  Cervix: normal appearance, cervix located anteriorly  Adnexa: normal bimanual exam  Uterus: retroverted and non-tender, normal size          Data Reviewed  Chart    A student assisted me with documenting this service. I saw the patient and reviewed and verified all information documented by the student.  I have also documented a note including the HPI, physical exam and assessment/plan.

## 2019-05-27 NOTE — Unmapped (Signed)
Patient Education      Patient Education      Patient Education      Patient Education      Patient Education        Operative Hysteroscopy: Before Your Procedure  What is an operative hysteroscopy?     An operative hysteroscopy is a procedure to find and treat problems with your uterus. It may be done to remove growths from the uterus. It may also be used to treat fertility problems or abnormal bleeding.  The doctor will guide a lighted tube through the cervix and into the uterus. This tube is called a hysteroscope, or scope. The doctor will fill your uterus with liquid. This makes it easier to see the inside of your uterus with the scope. Then the doctor will put tools through the scope to do the procedure.  Most of the liquid that the doctor puts in will flow out when the scope is removed.  You will most likely go home the same day. Many women are able to go back to work the next day. But it depends on what was done and the type of work you do.  Follow-up care is a key part of your treatment and safety. Be sure to make and go to all appointments, and call your doctor if you are having problems. It's also a good idea to know your test results and keep a list of the medicines you take.  What happens before the procedure?  Preparing for the procedure  ?? ?? Understand exactly what procedure is planned, along with the risks, benefits, and other options.    ?? Tell your doctors ALL the medicines, vitamins, supplements, and herbal remedies you take. Some of these can increase the risk of bleeding or interact with anesthesia.   ?? ?? If you take aspirin or some other blood thinner, ask your doctor if you should stop taking it before your procedure. Make sure that you understand exactly what your doctor wants you to do. These medicines increase the risk of bleeding. ?? ?? Your doctor will tell you which medicines to take or stop before your procedure. You may need to stop taking certain medicines a week or more before the procedure. So talk to your doctor as soon as you can.   ?? ?? If you have an advance directive, let your doctor know. It may include a living will and a durable power of attorney for health care. Bring a copy to the hospital. If you don't have one, you may want to prepare one. It lets your doctor and loved ones know your health care wishes. Doctors advise that everyone prepare these papers before any type of surgery or procedure.   What happens on the day of the procedure?   ?? Follow the instructions exactly about when to stop eating and drinking. If you don't, your procedure may be canceled. If your doctor has instructed you to take your medicines on the day of the procedure, take them with only a sip of water.   ?? ?? Take a bath or shower before you come in for your procedure. Do not apply lotions, perfumes, deodorants, or nail polish.   ?? ?? Take off all jewelry and piercings. And take out contact lenses, if you wear them.   At the hospital or surgery center   ?? Bring a picture ID.   ?? ?? You will be kept comfortable and safe by your anesthesia provider. The anesthesia may make you  sleep. Or it may just numb the area being worked on.   ?? ?? The procedure will probably take less than an hour.   When should you call your doctor?   ?? You have questions or concerns.   ?? ?? You don't understand how to prepare for your procedure.   ?? ?? You become ill before the procedure (such as fever, flu, or a cold).   ?? ?? You need to reschedule or have changed your mind about having the procedure.   Where can you learn more?  Go to [facility] at [email or web address]  Select Patient Education under [employer]. Enter [phone] in the search box to learn more about Operative Hysteroscopy: Before Your Procedure.  Current as of: June 15, 2018??????????????????????????????Content Version: 12.6 ?? 2006-2020 [employer].   Care instructions adapted under license by [facility]. If you have questions about a medical condition or this instruction, always ask your healthcare professional. [employer] disclaims any warranty or liability for your use of this information.         Operative Hysteroscopy: What to Expect at Home  Your Recovery     An operative hysteroscopy is a procedure to find and treat problems with your uterus. It may have been done to remove growths from the uterus. Or it may have been done to treat fertility problems or abnormal bleeding.  You may have cramps and vaginal bleeding for several days. If the doctor filled your uterus with air during the procedure, you may have gas pains, a feeling of fullness in your belly, or shoulder pain. These symptoms usually go away in 1 or 2 days. If the doctor filled your uterus with liquid during the procedure, you may have watery vaginal discharge for a few days.  Many women are able to return to work on the day after the procedure. But it depends on what was done during the procedure and the type of work you do.  This care sheet gives you a general idea about how long it will take for you to recover. But each person recovers at a different pace. Follow the steps below to get better as quickly as possible.  How can you care for yourself at home?  Activity  ?? ?? Rest when you feel tired. Getting enough sleep will help you recover.   ?? ?? Most women are able to return to work on the day after the procedure.   ?? ?? You may shower and take baths as usual.   ?? ?? Ask your doctor when it is okay for you to have sex.   ?? ?? Talk about birth control with your doctor. Do not try to become pregnant until your doctor says it is okay.   Diet  ?? ?? You can eat your normal diet. If your stomach is upset, try bland, low-fat foods like plain rice, broiled chicken, toast, and yogurt. ?? ?? You may notice that your bowel movements are not regular right after the procedure. This is common. Try to avoid constipation and straining with bowel movements. You may want to take a fiber supplement every day. If you have not had a bowel movement after a couple of days, ask your doctor about taking a mild laxative.   Medicines  ?? ?? Your doctor will tell you if and when you can restart your medicines. He or she will also give you instructions about taking any new medicines.   ?? ?? If you take aspirin or some other  blood thinner, ask your doctor if and when to start taking it again. Make sure that you understand exactly what your doctor wants you to do.   ?? ?? Be safe with medicines. Take pain medicines exactly as directed.  ? If the doctor gave you a prescription medicine for pain, take it as prescribed.  ? If you are not taking a prescription pain medicine, ask your doctor if you can take an over-the-counter medicine.  ? Do not take two or more pain medicines at the same time unless the doctor told you to. Many pain medicines have acetaminophen, which is Tylenol. Too much acetaminophen (Tylenol) can be harmful.   ?? ?? If you think your pain medicine is making you sick to your stomach:  ? Take your medicine after meals (unless your doctor has told you not to).  ? Ask your doctor for a different pain medicine.   ?? ?? If your doctor prescribed antibiotics, take them as directed. Do not stop taking them just because you feel better. You need to take the full course of antibiotics.   Other instructions  ?? ?? You may have some light vaginal bleeding. Wear sanitary pads if needed. Do not douche or use tampons until your doctor says it is okay.   ?? ?? You may want to use a heating pad on your belly to help with pain. Use a low heat setting. Follow-up care is a key part of your treatment and safety. Be sure to make and go to all appointments, and call your doctor if you are having problems. It's also a good idea to know your test results and keep a list of the medicines you take.  When should you call for help?   Call 911 anytime you think you may need emergency care. For example, call if:  ?? ?? You pass out (lose consciousness).   ?? ?? You have chest pain, are short of breath, or cough up blood.   Call your doctor now or seek immediate medical care if:  ?? ?? You have bright red vaginal bleeding that soaks one or more pads in an hour, or you have large clots.   ?? ?? You have vaginal discharge that has increased in amount or smells bad.   ?? ?? You are sick to your stomach or cannot drink fluids.   ?? ?? You have pain that does not get better after you take pain medicine.   ?? ?? You have signs of infection, such as:  ? Increased pain, swelling, warmth, or redness.  ? A fever.   ?? ?? You cannot pass stools or gas.   ?? ?? You have signs of a blood clot in your leg (called a deep vein thrombosis), such as:  ? Pain in your calf, back of the knee, thigh, or groin.  ? Redness and swelling in your leg.   Watch closely for changes in your health, and be sure to contact your doctor if you have any problems.  Where can you learn more?  Go to [facility] at [email or web address]  Select Patient Education under [employer]. Enter [phone] in the search box to learn more about Operative Hysteroscopy: What to Expect at Home.  Current as of: June 15, 2018??????????????????????????????Content Version: 12.6  ?? 2006-2020 [employer].   Care instructions adapted under license by [facility]. If you have questions about a medical condition or this instruction, always ask your healthcare professional. [employer] disclaims any warranty or liability for  your use of this information.         levonorgestrel intrauterine system Pronunciation:  LEE voe nor JES trel IN tra UE ter ine SIS tem  Brand:  Wyvonna Plum, Christean Grief  What is the most important information I should know about levonorgestrel intrauterine system?  You should not use this intrauterine device if you have abnormal vaginal bleeding, a pelvic infection, certain other problems with your uterus or cervix, or if you have breast or uterine cancer, liver disease or liver tumor, or a weak immune system.  Do not use during pregnancy. Call your doctor if you miss a period or think you might be pregnant.  What is levonorgestrel intrauterine system?  Levonorgestrel is a female hormone that can cause changes in your cervix, making it harder for sperm to reach the uterus and harder for a fertilized egg to attach to the uterus. Levonorgestrel intrauterine system is a plastic device that is placed in the uterus where it slowly releases the hormone to prevent pregnancy for 3 to 5 years.  Levonorgestrel intrauterine system is used to prevent pregnancy for up to 5 years. You may use this device whether you have children or not. Mirena is also used to treat heavy menstrual bleeding in women who choose to use an intrauterine form of birth control.  Levonorgestrel is a progestin hormone and does not contain estrogen.  The intrauterine device (IUD) releases levonorgestrel in the uterus, but only small amounts of the hormone reach the bloodstream. Levonorgestrel intrauterine system should not be used as emergency birth control.  Levonorgestrel intrauterine system may also be used for purposes not listed in this medication guide.  What should I discuss with my healthcare provider before taking levonorgestrel intrauterine system?  An intrauterine device can increase your risk of developing a serious pelvic infection, which may threaten your life or your future ability to have children. Ask your doctor about your personal risk. Do not use this IUD during pregnancy. This device can cause severe infection, miscarriage, premature birth, or death of the mother if left in place during pregnancy. Tell your doctor right away if you become pregnant.  If you choose to continue a pregnancy that occurs while using a levonorgestrel intrauterine system, watch for signs of infection such as fever, chills, flu symptoms, cramps, vaginal bleeding or discharge.  You should not use this device if you are allergic to levonorgestrel, silicone, silica, silver, barium, iron oxide, or polyethylene, or if you have:  ?? abnormal vaginal bleeding that has not been checked by a doctor;  ?? an untreated or uncontrolled pelvic infection (vaginal, cervical uterine, or bladder);  ?? endometriosis or a serious pelvic infection following a pregnancy or abortion within the past 3 months;  ?? a history of pelvic inflammatory disease (PID), unless you have had a normal pregnancy after the infection was treated and cleared;  ?? uterine fibroid tumors or other conditions that affect the shape of the uterus;  ?? past or present breast cancer, known or suspected cervical or uterine cancer;  ?? liver disease or liver tumor (benign or malignant);  ?? a recent abnormal Pap smear that has not yet been diagnosed or treated;  ?? a disease or condition that weakens your immune system, such as AIDS, leukemia, or IV drug abuse; or  ?? if you have another intrauterine device (IUD) in place.  To make sure levonorgestrel is safe for you, tell your doctor if you have ever had:  ?? high blood pressure, heart disease  or a heart valve disorder;  ?? a heart attack or stroke;  ?? a bleeding or blood-clotting disorder;  ?? migraine headaches; or  ?? a vaginal infection, pelvic infection, or sexually transmitted disease. You should not use this IUD if you are breast-feeding a baby younger than 70 weeks old. This IUD may be more likely to form a hole or get embedded in the wall of your uterus if you have the device inserted while you are breast-feeding.  How is levonorgestrel intrauterine system used?  Levonorgestrel intrauterine system is a T-shaped plastic device that is inserted through the vagina and placed into the uterus by a doctor. The device is usually inserted within 7 days after the start of a menstrual period.  You may feel pain or dizziness during insertion of the IUD. You may also have minor vaginal bleeding. Tell your doctor if you still have these symptoms longer than 30 minutes.  The levonorgestrel device should not interfere with sexual intercourse, wearing tampons, or using other vaginal medications.  After each menstrual period, make sure you can still feel the removal strings. Wash your hands with soap and water, and insert your clean fingers into the vagina. You should be able to feel the strings at the opening of your cervix. Call your doctor at once if you cannot feel the strings, or if you think the device has slipped lower in your uterus or out of your uterus. A sudden increase in menstrual flow may be a sign that the device has slipped out of place.  If you think the device is not properly in place, use a non-hormone method of birth control (condom, or diaphragm with spermicide) to prevent pregnancy until your doctor is able to replace the IUD.  Your doctor will need to see you within a few weeks after insertion of the device to make sure it is still in place correctly. You will also need regular annual pelvic exams and Pap smears.  You may have irregular periods during the first 3 to 6 months of use. Your flow may be lighter or heavier, and you may eventually stop having periods after several months. Call your doctor if you miss a period or think you might be pregnant. If you need to have an MRI (magnetic resonance imaging), tell your caregivers ahead of time that you have an IUD in place.  Your device may be removed at any time you decide to stop using birth control. The Mirena or Kyleena intrauterine system must be removed at the end of the 5-year wearing time. The Skyla or Liletta device must be removed after 3 years. Your doctor can insert a new device at that time if you wish to continue using this form of birth control. Only your doctor should remove the IUD. Do not attempt to remove the device yourself.  If you wish to continue preventing pregnancy, you may need to start using another birth control method a week before your levonorgestrel intrauterine system is removed.  What happens if I miss a dose?  Since the IUD continuously releases a low dose of levonorgestrel, missing a dose does not occur when using this form of levonorgestrel.  What happens if I overdose?  An overdose of levonorgestrel released from the intrauterine system is very unlikely to occur.  What should I avoid while using levonorgestrel intrauterine system?  Avoid having more than one sexual partner. The IUD can increase your risk of developing a serious pelvic infection, which is often caused by sexually  transmitted disease. Levonorgestrel intrauterine system will not protect you from sexually transmitted diseases, including HIV and AIDS. Using a condom is the only way to help protect yourself from these diseases.  Call your doctor if your sexual partner develops HIV or a sexually transmitted disease, or if you have any change in sexual relationships.  What are the possible side effects of levonorgestrel intrauterine system?  Get emergency medical help if you have severe pain in your lower stomach or side. This could be a sign of a tubal pregnancy (a pregnancy that implants in the fallopian tube instead of the uterus). A tubal pregnancy is a medical emergency. The levonorgestrel IUD may become embedded into the wall of the uterus, or may perforate (form a hole) in the uterus.  If this occurs, the device may no longer prevent pregnancy, or it may move outside the uterus and cause scarring, infection, or damage to other organs. Your doctor may need to surgically remove the device.  Call your doctor at once if you have:  ?? severe cramps or pelvic pain, pain during sexual intercourse;  ?? extreme dizziness or light-headed feeling;  ?? severe migraine headache;  ?? heavy or ongoing vaginal bleeding, vaginal sores, vaginal discharge that is watery, foul-smelling discharge, or otherwise unusual;  ?? pale skin, weakness, easy bruising or bleeding, fever, chills, or other signs of infection;  ?? sudden numbness or weakness (especially on one side of the body), confusion, problems with vision, sensitivity to light;  ?? jaundice (yellowing of the skin or eyes); or  ?? signs of an allergic reaction: hives; difficulty breathing; swelling of your face, lips, tongue, or throat.  Common side effects may include:  ?? pelvic pain, vaginal itching or infection, irregular menstrual periods, changes in bleeding patterns or flow;  ?? stomach pain, nausea, vomiting, bloating;  ?? headache, depression, mood changes;  ?? back pain, breast tenderness or pain;  ?? weight gain, acne, changes in hair growth, loss of interest in sex; or  ?? puffiness in your face, hands, ankles, or feet.  This is not a complete list of side effects and others may occur. Call your doctor for medical advice about side effects. You may report side effects to FDA at 1-800-FDA-1088.  What other drugs will affect levonorgestrel intrauterine system?  Other drugs may interact with levonorgestrel, including prescription and over-the-counter medicines, vitamins, and herbal products. Tell your doctor about all your current medicines and any medicine you start or stop using.  Where can I get more information? Your doctor or pharmacist can provide more information about the levonorgestrel intrauterine system.  Remember, keep this and all other medicines out of the reach of children, never share your medicines with others, and use this medication only for the indication prescribed.   Every effort has been made to ensure that the information provided by [employer]') is accurate, up-to-date, and complete, but no guarantee is made to that effect. Drug information contained herein may be time sensitive. [employer] information has been compiled for use by healthcare practitioners and consumers in the [place] and therefore [employer] does not warrant that uses outside of the [place] are appropriate, unless specifically indicated otherwise. [employer]'s drug information does not endorse drugs, diagnose patients or recommend therapy. [employer]'s drug information is an Armed forces technical officer designed to assist licensed healthcare practitioners in caring for their patients and/or to serve consumers viewing this service as a supplement to, and not a substitute for, the expertise, skill, knowledge and judgment of  healthcare practitioners. The absence of a warning for a given drug or drug combination in no way should be construed to indicate that the drug or drug combination is safe, effective or appropriate for any given patient. [employer] does not assume any responsibility for any aspect of healthcare administered with the aid of information [employer] provides. The information contained herein is not intended to cover all possible uses, directions, precautions, warnings, drug interactions, allergic reactions, or adverse effects. If you have questions about the drugs you are taking, check with your doctor, nurse or pharmacist.  Copyright [phone] [employer] Version: 7.02. Revision date: 03/04/2016. Care instructions adapted under license by [facility]. If you have questions about a medical condition or this instruction, always ask your healthcare professional. [employer] disclaims any warranty or liability for your use of this information.       Abnormal Uterine Bleeding: Care Instructions  Your Care Instructions     Abnormal uterine bleeding is irregular bleeding from the uterus that is longer or heavier than usual or does not occur at your regular time. Sometimes it is caused by changes in hormone levels. It can also be caused by growths in the uterus, such as fibroids or polyps. Sometimes a cause cannot be found.  You may have heavy bleeding when you are not expecting your period. Your doctor may suggest a pregnancy test, if you think you are pregnant.  Follow-up care is a key part of your treatment and safety. Be sure to make and go to all appointments, and call your doctor if you are having problems. It's also a good idea to know your test results and keep a list of the medicines you take.  How can you care for yourself at home?  ?? Be safe with medicines. Take pain medicines exactly as directed.  ? If the doctor gave you a prescription medicine for pain, take it as prescribed.  ? If you are not taking a prescription pain medicine, ask your doctor if you can take an over-the-counter medicine.  ?? You may be low in iron because of blood loss. Eat a balanced diet that is high in iron and vitamin C. Foods rich in iron include red meat, shellfish, eggs, beans, and leafy green vegetables. Talk to your doctor about whether you need to take iron pills or a multivitamin.  When should you call for help?   Call 911 anytime you think you may need emergency care. For example, call if:  ?? ?? You passed out (lost consciousness).   Call your doctor now or seek immediate medical care if:  ?? ?? You have new or worse belly or pelvic pain.   ?? ?? You have severe vaginal bleeding. ?? ?? You feel dizzy or lightheaded, or you feel like you may faint.   Watch closely for changes in your health, and be sure to contact your doctor if:  ?? ?? You think you may be pregnant.   ?? ?? Your bleeding gets worse.   ?? ?? You do not get better as expected.   Where can you learn more?  Go to [facility] at [email or web address]  Select Patient Education under [employer]. Enter [phone] in the search box to learn more about Abnormal Uterine Bleeding: Care Instructions.  Current as of: June 15, 2018??????????????????????????????Content Version: 12.6  ?? 2006-2020 [employer], [employer].   Care instructions adapted under license by [facility]. If you have questions about a medical condition or this instruction, always ask your healthcare  professional. [employer] disclaims any warranty or liability for your use of this information.         Endometrial Ablation: What to Expect at Home  Your Recovery  Endometrial ablation is a procedure to treat very heavy menstrual bleeding or other abnormal bleeding in the uterus. During ablation, the lining of the uterus is destroyed. The lining heals by scarring. The scarring reduces or prevents bleeding. Your doctor inserted a device into your uterus to destroy the lining.  You may have cramps and vaginal bleeding for several days. You may also have watery vaginal discharge mixed with blood for a few days.  It may take a few days to 2 weeks to recover.  This care sheet gives you a general idea about how long it will take for you to recover. But each person recovers at a different pace. Follow the steps below to feel better as quickly as possible.  How can you care for yourself at home?  Activity  ?? ?? Rest when you feel tired. Getting enough sleep will help you recover.   ?? ?? Most women are able to return to work on the day after the procedure.   ?? ?? You may shower and take baths as usual.   ?? ?? Ask your doctor when it is okay for you to have sex.   Diet ?? ?? You can eat your normal diet. If your stomach is upset, try bland, low-fat foods like plain rice, broiled chicken, toast, and yogurt.   ?? ?? You may notice that your bowel movements are not regular right after the procedure. This is common. Try to avoid constipation and straining with bowel movements. You may want to take a fiber supplement every day. If you have not had a bowel movement after a couple of days, ask your doctor about taking a mild laxative.   Medicines  ?? ?? Your doctor will tell you if and when you can restart your medicines. He or she will also give you instructions about taking any new medicines.   ?? ?? If you take aspirin or some other blood thinner, ask your doctor if and when to start taking it again. Make sure that you understand exactly what your doctor wants you to do.   ?? ?? Take pain medicines exactly as directed.  ? If the doctor gave you a prescription medicine for pain, take it as prescribed.  ? If you are not taking a prescription pain medicine, ask your doctor if you can take an over-the-counter medicine.   ?? ?? If you think your pain medicine is making you sick to your stomach:  ? Take your medicine after meals (unless your doctor has told you not to).  ? Ask your doctor for a different pain medicine.   ?? ?? If your doctor prescribed antibiotics, take them as directed. Do not stop taking them just because you feel better. You need to take the full course of antibiotics.   Other instructions  ?? ?? You may have some light vaginal bleeding. Wear sanitary pads if needed. Do not douche or use tampons until your doctor says it is okay.   ?? ?? You may want to use a heating pad on your belly to help with pain. Use a low heat setting.   ?? ?? Talk with your doctor about birth control. Endometrial ablation usually causes infertility, but pregnancy may still be possible. And the pregnancy could have severe problems. Follow-up care is a key part  of your treatment and safety. Be sure to make and go to all appointments, and call your doctor if you are having problems. It's also a good idea to know your test results and keep a list of the medicines you take.  When should you call for help?   Call 911 anytime you think you may need emergency care. For example, call if:  ?? ?? You passed out (lost consciousness).   ?? ?? You have chest pain, are short of breath, or cough up blood.   Call your doctor now or seek immediate medical care if:  ?? ?? You have pain that does not get better after you take pain medicine.   ?? ?? You cannot pass stools or gas.   ?? ?? You have vaginal discharge that has increased in amount or smells bad.   ?? ?? You are sick to your stomach or cannot drink fluids.   ?? ?? You have signs of infection, such as:  ? Increased pain, swelling, warmth, or redness.  ? A fever.   ?? ?? You have bright red vaginal bleeding that soaks one or more pads in an hour, or you have large clots.   ?? ?? You have signs of a blood clot in your leg (called a deep vein thrombosis), such as:  ? Pain in your calf, back of the knee, thigh, or groin.  ? Redness and swelling in your leg.   Watch closely for any changes in your health, and be sure to contact your doctor if you have any problems.  Where can you learn more?  Go to [facility] at https://myuncchart.org  Select Patient Education under [employer]. Enter [phone] in the search box to learn more about Endometrial Ablation: What to Expect at Home.  Current as of: June 15, 2018??????????????????????????????Content Version: 12.6  ?? 2006-2020 [employer].   Care instructions adapted under license by [facility]. If you have questions about a medical condition or this instruction, always ask your healthcare professional. [employer]wise, Inc[employer] disclaims any warranty or liability for your use of this information.

## 2019-05-28 ENCOUNTER — Telehealth: Payer: Self-pay

## 2019-05-28 DIAGNOSIS — N840 Polyp of corpus uteri: Principal | ICD-10-CM

## 2019-05-28 NOTE — Unmapped (Signed)
Writer spoke with Mary Hawkins re: request received to get Mary Hawkins rescheduled from missed appointment 04/16/19 in [facility]. Informed Mary Hawkins no current openings and will have to add to wait list for scheduling once available. Mary Hawkins agreed to be placed on wait list.    From: Karie Chimera, MD   Sent: 05/27/2019 ?? 1:33 PM EDT   To: Merilyn Baba, MD, Ottie Glazier, MSW, *     Hi all,     This patient missed her initial appointment to establish with psych on 04/16/2019. ??Could you give her a call to assist with rescheduling that missed initial appointment with an [facility] clinic. Mobile number from Epic [phone]     Thank you,     Imagene Sheller

## 2019-05-28 NOTE — Telephone Encounter (Signed)
Received Medical Clearance on 05/23/2019. Dr. Jannifer Franklin signed form on 05/27/19 stating pt is stable for out pt general anesthesia and dental surgery and I have attached office visit not from 02/07/2019.  Forms have been given to debra for processing.

## 2019-05-28 NOTE — Telephone Encounter (Signed)
Pt clearance letter faxed on 05/28/19 and copy mailed to pt.

## 2019-05-29 MED ORDER — ESOMEPRAZOLE MAGNESIUM 40 MG CAPSULE,DELAYED RELEASE: 40 mg | capsule | Freq: Two times a day (BID) | 5 refills | 30 days | Status: AC

## 2019-05-29 MED ORDER — DIPHENOXYLATE-ATROPINE 2.5 MG-0.025 MG TABLET: tablet | 1 refills | 0 days | Status: AC

## 2019-05-29 NOTE — Unmapped (Signed)
Problem List Items Addressed This Visit         ??     ?? Digestive    ?? Crohn's disease of small intestine with other complication (CMS-HCC)        ??       Other Visit Diagnoses    ?? Irregular menses    -  Primary   ?? Encounter for preventive health examination       ?? Adnexal cyst       ?? Endometrial polyp       ?? History of endometriosis       ??      ??  Assessment    Symptoms likely GI in origin; there could be a component of her chronic abdominal pain 2/2 to endometriosis  Large, asymptomatic endometrial polyp  H/O functional ovarian cysts   Ovulatory dysfunction  ??  ??  ??  ??  Plan  ??  Endometrial Polyp  - Interested in pursuing operative hysteroscopy w/polypectomy/D&C.  She was counseled about alternative diagnostic or treatment approaches, and informed consent regarding expected treatment success and possible complications. She was also informed of possible need to abandon or prematurely stop a procedure due to fluid overload. Also, since uterine perforation is a possible complication, she was counseled re: a possible laparoscopy or laparotomy if it becomes necessary to rule out visceral or vascular injury.Education materials were provided  - Will consider Mirena placement at the time of hysteroscopy for medical management of the endometriosis and to provide endometrial protection  - Follow-up 4-6 weeks post-op  ??  Oligomenorrhea  - Obtain TSH, FSH, estradiol, free/total testosterone, 17-OH P, prolactin   ??  ??  Will review previous records  ??  ??  ??  Patient ID: Mary Hawkins, female   DOB: [birth year], 38 y.o.   MRN: [id number]  ??      Chief Complaint   Patient presents with   ??? Ovarian Cyst   ??  ??  HPI Mary Hawkins is a 38 y.o. female [phone] with a h/o  endometriosis (diagnosed by laparoscopy in 2010), and IBD (both Crohns and ulcerative colitis) s/p bowel resection in 04/2019 c/b fistula & abscess formation, who presents for abdominal pain, cramping, and irregular menses. She is seen at the request of Thurmon Fair, MD.  ??  Mary Hawkins has a 2-year history of abdominal pain bilaterally that she describes as pulsating.  The pain has no relation to her bowel movements or her menstrual periods.  The pain  is periumbilical on the right and left side, radiating to sides.  No pelvic or suprapubic pain.  The pain is constant, but once every few months will intensify to a debilitating level, at which point she typically presents to the [facility] ED.  She has been told that transvaginal U/S during these visits have shown cysts on her ovaries. She also complains of early satiety and increased abdominal distention, with significant abdominal girth.  ??  She also complains of irregular meses, which she says has been present since menarche at age 49, but has recently gotten worse.  She can sometimes go 6+ months without menstruating.  Most recent LMP 04/25/2019.  Periods last 1-3 days with moderate flow, changes pads a few times per day.  She endorses cramping with periods.  ??  She has not seen a gynecologist in the last ten years, no pap smears in the last ten years.  Unknown history of abnormal pap smears.  Has never tested  positive for STIs.  Has one prior gyn surgery of diagnostic laparoscopy in June 2010 for diagnosis of endometriosis.  Had some resolution of symptoms after surgery, but the resolution was transient and symptoms quickly returned that year.   ??  She is not sexually active.  Does not desire future fertility.  ??  Past Medical History        Past Medical History:   Diagnosis Date   ??? Anxiety ??   ??? Arthritis ??   ??? Back pain ??   ??? Crohn disease (CMS-HCC) ??   ??? Eczema ??   ??? Endometriosis ??? Fibromyalgia ??   ??? GERD (gastroesophageal reflux disease) ??   ??? Headache(784.0) ??   ??? Hypertension ??   ??? Ulcerative colitis (CMS-HCC) ??      ??  ??  Past Surgical History         Past Surgical History:   Procedure Laterality Date   ??? BREAST RECONSTRUCTION ?? 2004   ??? ENDOMETRIAL ABLATION ?? ??   ??? HIP SURGERY ?? ??   ??? PR COLONOSCOPY FLX DX W/COLLJ SPEC WHEN PFRMD N/A 11/12/2013   ?? Procedure: COLONOSCOPY, FLEXIBLE, PROXIMAL TO SPLENIC FLEXURE; DIAGNOSTIC, W/WO COLLECTION SPECIMEN BY BRUSH OR WASH;  Surgeon: Teodoro Spray, MD;  [facility];  Service: Gastroenterology   ??? PR COLONOSCOPY FLX DX W/COLLJ SPEC WHEN PFRMD N/A 02/20/2014   ?? Procedure: COLONOSCOPY, FLEXIBLE, PROXIMAL TO SPLENIC FLEXURE; DIAGNOSTIC, W/WO COLLECTION SPECIMEN BY BRUSH OR WASH;  Surgeon: Billie Ruddy, MD;  [facility];  Service: Gastroenterology   ??? PR COLONOSCOPY FLX DX W/COLLJ SPEC WHEN PFRMD N/A 12/27/2017   ?? Procedure: COLONOSCOPY, FLEXIBLE, PROXIMAL TO SPLENIC FLEXURE; DIAGNOSTIC, W/WO COLLECTION SPECIMEN BY BRUSH OR WASH;  Surgeon: Bronson Curb, MD;  [facility];  Service: Gastroenterology   ??? PR COLONOSCOPY FLX DX W/COLLJ SPEC WHEN PFRMD N/A 03/15/2019   ?? Procedure: COLONOSCOPY, FLEXIBLE, PROXIMAL TO SPLENIC FLEXURE; DIAGNOSTIC, W/WO COLLECTION SPECIMEN BY BRUSH OR WASH;  Surgeon: Thurmon Fair, MD;  Location: [facility];  Service: Gastroenterology   ??? PR REMVL COLON & TERM ILEUM W/ILEOCOLOSTOMY N/A 11/13/2013   ?? Procedure: Ileocecectomy, meckel's diverticulectomy, mesh rectopexy;  Surgeon: Emilia Beck, MD;  Location: MAIN OR [place];  Service: Gastrointestinal   ??? PR REMVL COLON & TERM ILEUM W/ILEOCOLOSTOMY N/A 04/25/2019   ?? Procedure: Colectomy, Partial, With Removal Of Terminal Ileum With Ileocolostomy;  Surgeon: [name], MD;  Location: MA[facility] OR [facility];  Service: Gastrointestinal ??? PR UPPER GI ENDOSCOPY,BIOPSY N/A 11/12/2013   ?? Procedure: UGI ENDOSCOPY; WITH BIOPSY, SINGLE OR MULTIPLE;  Surgeon: Teodoro Spray, MD;  [facility];  Service: Gastroenterology   ??? PR UPPER GI ENDOSCOPY,BIOPSY N/A 12/27/2017   ?? Procedure: UGI ENDOSCOPY; WITH BIOPSY, SINGLE OR MULTIPLE;  Surgeon: Bronson Curb, MD;  [facility];  Service: Gastroenterology   ??? PR UPPER GI ENDOSCOPY,BIOPSY N/A 03/15/2019   ?? Procedure: UGI ENDOSCOPY; WITH BIOPSY, SINGLE OR MULTIPLE;  Surgeon: Thurmon Fair, MD;  Location: [facility];  Service: Gastroenterology   ??? RECTAL PROLAPSE REPAIR ?? ??      ??  ??  Family History         Family History   Problem Relation Age of Onset   ??? Esophageal cancer Maternal Grandfather ??   ??? Cancer Maternal Grandfather ??   ??  Gastric cancer      ??  ??  Social History  Social History   ??        Tobacco Use   ??? Smoking status: Current Every Day Smoker   ?? ?? Packs/day: 0.50   ?? ?? Years: 15.00   ?? ?? Pack years: 7.50   ?? ?? Types: Cigarettes   ??? Smokeless tobacco: Never Used   ??? Tobacco comment: Pt smokes 10cpd, pt expressed desire to minimize tobacco use    Substance Use Topics   ??? Alcohol use: Yes   ?? ?? Frequency: 2-4 times a month   ?? ?? Drinks per session: 1 or 2   ?? ?? Comment: socially   ??? Drug use: No   ??  ??        Allergies   Allergen Reactions   ??? Penicillins Hives   ?? ?? Has patient had a PCN reaction causing immediate rash, facial/tongue/throat swelling, SOB or lightheadedness with hypotension: Yes  Has patient had a PCN reaction causing severe rash involving mucus membranes or skin necrosis: Unk  Has patient had a PCN reaction that required hospitalization: Yes  Has patient had a PCN reaction occurring within the last 10 years: No  If all of the above answers are NO, then may proceed with Cephalosporin use. Has patient had a PCN reaction causing immediate rash, facial/tongue/throat swelling, SOB or lightheadedness with hypotension: Yes  Has patient had a PCN reaction causing severe rash involving mucus membranes or skin necrosis: Unk  Has patient had a PCN reaction that required hospitalization: Yes  Has patient had a PCN reaction occurring within the last 10 years: No  If all of the above answers are NO, then may proceed with Cephalosporin use.   ??? Doxycycline Hcl Other (See Comments)   ?? ?? Reaction not recalled   ??  ??  Current Medications          Current Outpatient Medications   Medication Sig Dispense Refill   ??? acetaminophen (TYLENOL) 500 MG tablet Take 1,000 mg by mouth every six (6) hours as needed for pain. ?? ??   ??? cyanocobalamin 1,000 mcg/mL injection Inject 1 mL (1,000 mcg total) under the skin every thirty (30) days. 4 mL 2   ??? famotidine (PEPCID) 40 MG tablet Take 1/2 tablet (20 mg total) by mouth Two (2) times a day. 90 each 3   ??? ferrous gluconate 324 MG tablet Take 324 mg by mouth daily. ?? ??   ??? gabapentin (NEURONTIN) 800 MG tablet Take 800 mg by mouth Four (4) times a day with a meal and nightly.  ?? ??   ??? hydroxyzine (ATARAX) 50 mg capsule/tablet Take 1 each (50 mg total) by mouth Three (3) times a day as needed. 90 tablet 5   ??? magnesium oxide (MAG-OX) 400 mg (241.3 mg magnesium) tablet Take 2 tablets (800 mg total) by mouth Two (2) times a day. 120 tablet 5   ??? milnacipran (SAVELLA) 25 mg Tab Take 25 mg by mouth Two (2) times a day. ?? ??   ??? pantoprazole (PROTONIX) 40 MG tablet Take 1 tablet (40 mg total) by mouth daily. 90 tablet 1   ??? potassium chloride (KLOR-CON) 20 MEQ CR tablet Take 1 tablet (20 mEq total) by mouth daily. 30 tablet 5   ??? pramipexole (MIRAPEX) 0.75 MG tablet Take 0.75 mg by mouth nightly. ?? ??   ??? traZODone (DESYREL) 50 MG tablet Take 1 tablet (50 mg total)  by mouth nightly. 30 tablet 2 ??? nicotine polacrilex (NICORETTE) 4 MG gum Apply 1 each (4 mg total) to cheek every two (2) hours as needed for smoking cessation. (Patient not taking: Reported on 05/27/2019) 110 each 0   ??  No current facility-administered medications for this visit.       ??  ??  Review of Systems  Review of Systems  Constitutional: negative for fatigue and weight loss  Respiratory: negative for cough and wheezing  Cardiovascular: negative for chest pain, fatigue and palpitations  Gastrointestinal: positive for abdominal pain, increased abdominal girth, early satiety, no change in bowel habits  Genitourinary:negative, no dysuria  Integument/breast: negative for nipple discharge  Musculoskeletal: negative for myalgias  Neurological: negative for gait problems and tremors  Behavioral/Psych: negative for abusive relationship, depression  Endocrine: negative for temperature intolerance        Blood pressure 120/74, pulse 106, height 160 cm (5' 3), weight 72.1 kg (158 lb 14.4 oz), last menstrual period 04/25/2019, not currently breastfeeding.  ??  Physical Exam  Physical Exam  General:   alert   Skin:   no rash or abnormalities   Abdomen:  no organomegaly, soft abdomen; tender to palpation in all four quadrants on light palpation (R > L); well-healed low transverse abdominal scar   Pelvis:  External genitalia: normal general appearance  Urinary system: urethral meatus normal and bladder without fullness, nontender  Vaginal: normal without tenderness, induration or masses  Cervix: normal appearance, cervix located anteriorly  Adnexa: normal bimanual exam  Uterus: retroverted and non-tender, normal size      ??  ??  Data Reviewed  Chart  ??  A student assisted me with documenting this service. I saw the patient and reviewed and verified all information documented by the student.  I have also documented a note including the HPI, physical exam and assessment/plan.

## 2019-05-30 DIAGNOSIS — R7989 Other specified abnormal findings of blood chemistry: Principal | ICD-10-CM

## 2019-05-30 LAB — 17-HYDROXYPROGESTERONE: 17-Hydroxyprogesterone:MCnc:Pt:Ser/Plas:Qn:: 150

## 2019-05-30 MED FILL — ESOMEPRAZOLE MAGNESIUM 40 MG CAPSULE,DELAYED RELEASE: ORAL | 30 days supply | Qty: 60 | Fill #0

## 2019-05-30 MED FILL — ESOMEPRAZOLE MAGNESIUM 40 MG CAPSULE,DELAYED RELEASE: 30 days supply | Qty: 60 | Fill #0 | Status: AC

## 2019-05-31 LAB — TESTOSTERONE FREE: Testosterone.free:MCnc:Pt:Ser/Plas:Qn:: 0.35

## 2019-05-31 NOTE — Unmapped (Signed)
Patient informed.  Verbalized understanding.

## 2019-05-31 NOTE — Unmapped (Signed)
Please inform the patient of the borderline elevated TSH.  The remainder of her tests were normal  I am placing an order for a free T4.

## 2019-06-04 NOTE — Unmapped (Signed)
This patient has been disenrolled from the [facility] specialty pharmacy services due to medication discontinuation--no longer on Stelara.    Julianne Rice  [facility] Specialty Pharmacist

## 2019-06-05 ENCOUNTER — Other Ambulatory Visit: Payer: Self-pay | Admitting: Neurology

## 2019-06-05 MED ORDER — ONDANSETRON 8 MG DISINTEGRATING TABLET
ORAL_TABLET | Freq: Three times a day (TID) | ORAL | 0 refills | 20.00000 days | PRN
Start: 2019-06-05 — End: ?

## 2019-06-05 MED ORDER — TRAZODONE 50 MG TABLET
ORAL_TABLET | 0 refills | 0 days | Status: CP
Start: 2019-06-05 — End: ?
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
## 2019-06-10 ENCOUNTER — Encounter: Payer: Self-pay | Admitting: Neurology

## 2019-06-10 ENCOUNTER — Ambulatory Visit: Payer: Medicaid Other | Admitting: Neurology

## 2019-06-10 ENCOUNTER — Other Ambulatory Visit: Payer: Self-pay | Admitting: Orthopedic Surgery

## 2019-06-10 ENCOUNTER — Other Ambulatory Visit: Payer: Self-pay

## 2019-06-10 VITALS — BP 130/90 | HR 99 | Temp 96.5°F | Ht 62.0 in | Wt 159.0 lb

## 2019-06-10 DIAGNOSIS — M797 Fibromyalgia: Secondary | ICD-10-CM

## 2019-06-10 DIAGNOSIS — G2581 Restless legs syndrome: Secondary | ICD-10-CM

## 2019-06-10 DIAGNOSIS — M25551 Pain in right hip: Secondary | ICD-10-CM

## 2019-06-10 MED ORDER — SAVELLA 50 MG PO TABS: 50.0000 mg | ORAL_TABLET | Freq: Two times a day (BID) | ORAL | 3 refills | Status: DC

## 2019-06-10 NOTE — Patient Instructions (Signed)
You may increase Savella 50 mg twice a day to see if that helps with your fibromyalgia pain. I will place a referral for pain management. I will check ferritin today.

## 2019-06-11 LAB — FERRITIN: Ferritin: 33 ng/mL (ref 15–150)

## 2019-06-11 NOTE — Progress Notes (Signed)
I have read the note, and I agree with the clinical assessment and plan.  Amia Rynders K Aalani Aikens   

## 2019-06-12 ENCOUNTER — Telehealth: Payer: Self-pay | Admitting: *Deleted

## 2019-06-12 DIAGNOSIS — N840 Polyp of corpus uteri: Principal | ICD-10-CM

## 2019-06-12 DIAGNOSIS — Z01818 Encounter for other preprocedural examination: Principal | ICD-10-CM

## 2019-06-12 NOTE — Telephone Encounter (Signed)
LVM informing the patient that the ferritin level was normal. The NP advises she please continue ferrous gluconate. Left number for questions.

## 2019-06-15 ENCOUNTER — Ambulatory Visit: Admit: 2019-06-15 | Discharge: 2019-06-16 | Payer: MEDICAID

## 2019-06-17 MED ORDER — HYDROXYZINE (ATARAX) 50 MG CAPSULE/TABLET WRAPPER
ORAL_TABLET | Freq: Three times a day (TID) | ORAL | 1 refills | 30 days | Status: CP | PRN
Start: 2019-06-17 — End: 2020-06-16
  Filled 2019-06-18: qty 90, 30d supply, fill #0

## 2019-06-18 ENCOUNTER — Other Ambulatory Visit: Payer: Medicaid Other

## 2019-06-18 ENCOUNTER — Ambulatory Visit
Admission: RE | Admit: 2019-06-18 | Discharge: 2019-06-18 | Disposition: A | Discharge status: Home / Self Care | Payer: Medicaid Other | Source: Ambulatory Visit | Attending: Orthopedic Surgery | Admitting: Orthopedic Surgery

## 2019-06-18 DIAGNOSIS — M25551 Pain in right hip: Secondary | ICD-10-CM

## 2019-06-18 IMAGING — CT CT HIP*R* W/O CM
1 of 2 series · 13 of 32 positions shown, 19 images · non-contrast
Comparison: CT [DATE].  X-ray [DATE]

CLINICAL DATA: Right pain and numbness.  Right [DATE]

EXAM:
CT OF THE RIGHT HIP WITHOUT CONTRAST
TECHNIQUE: Multidetector CT imaging of the right hip was performed according to
the standard protocol. Multiplanar CT image reconstructions were
also generated.

[Series 2: bony pelvis/hip · axial · 0.44mm/px · z∈[-235,-64]mm · 13 of 67 slices shown, 19 images]
[im 5/67  soft-tissue]
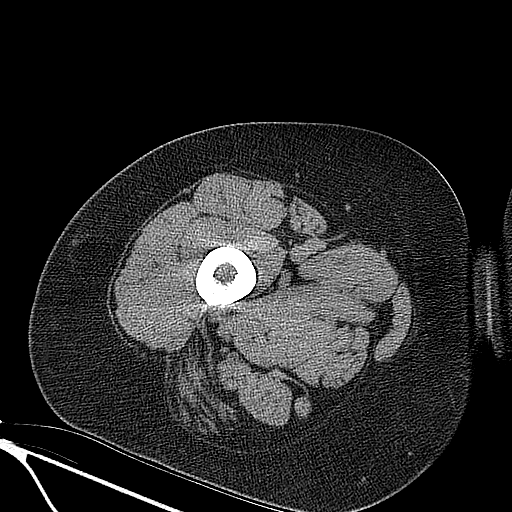
[im 5/67  bone]
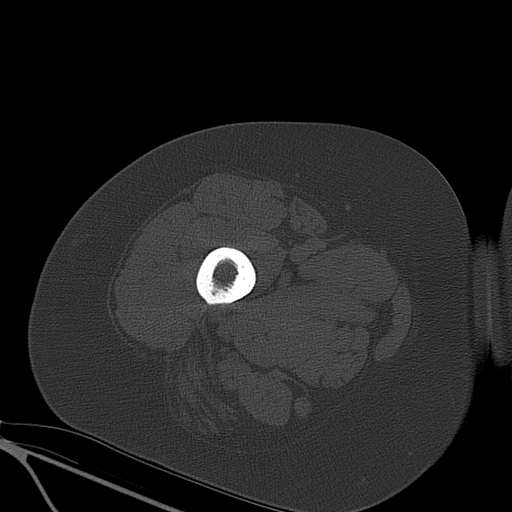
[im 9/67  soft-tissue]
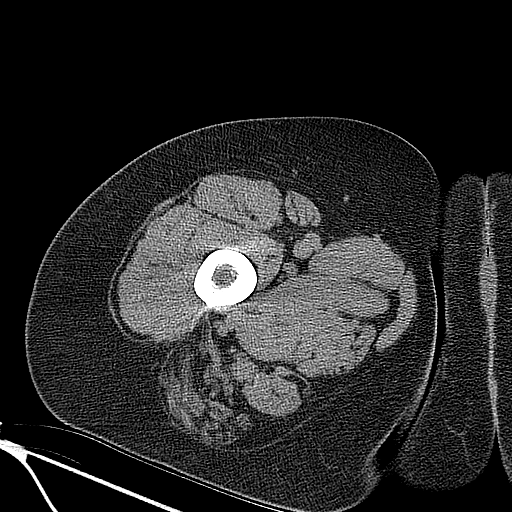
[im 14/67  soft-tissue]
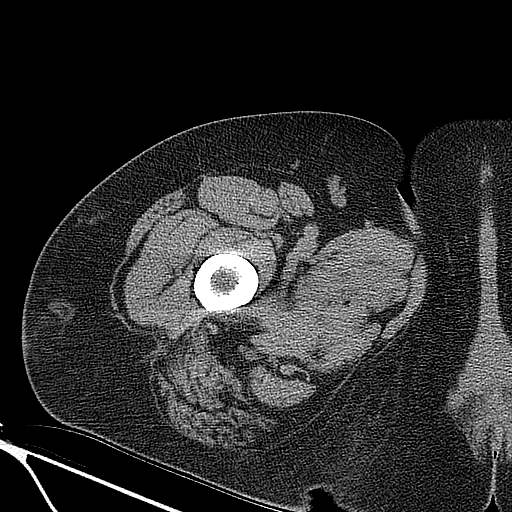
[im 18/67  soft-tissue]
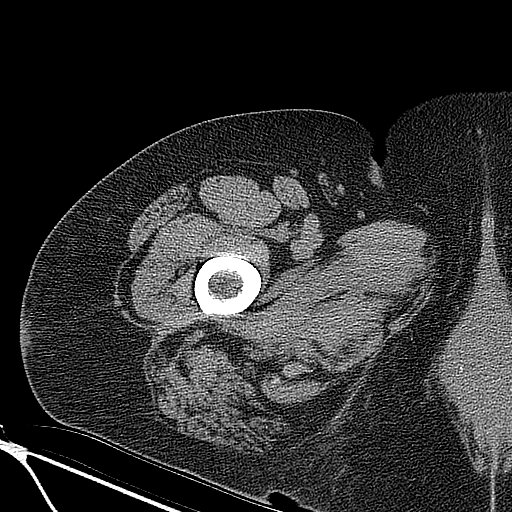
[im 23/67  soft-tissue]
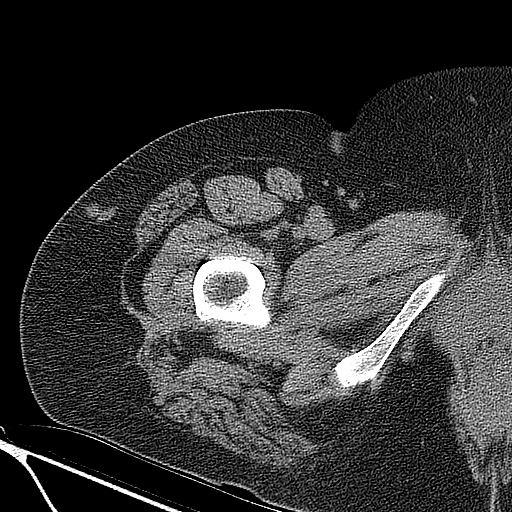
[im 27/67  soft-tissue]
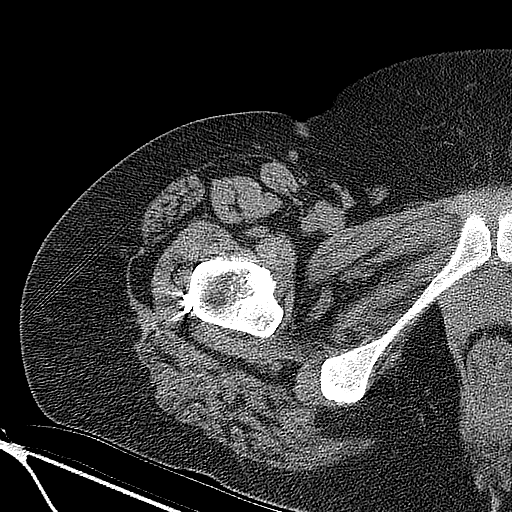
[im 36/67  soft-tissue]
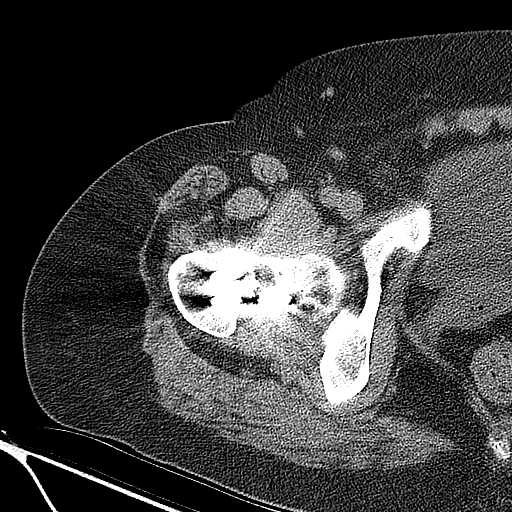
[im 40/67  soft-tissue]
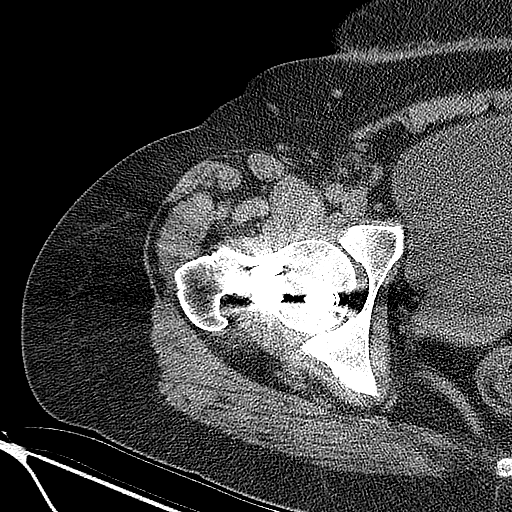
[im 45/67  soft-tissue]
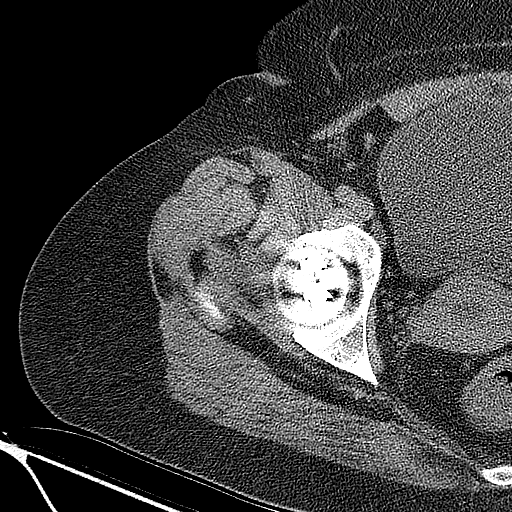
[im 45/67  bone]
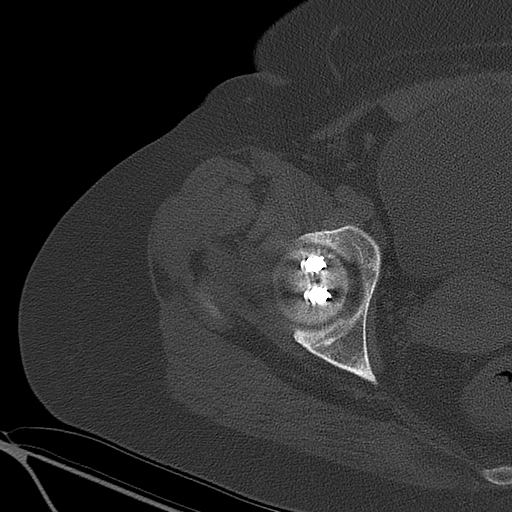
[im 49/67  soft-tissue]
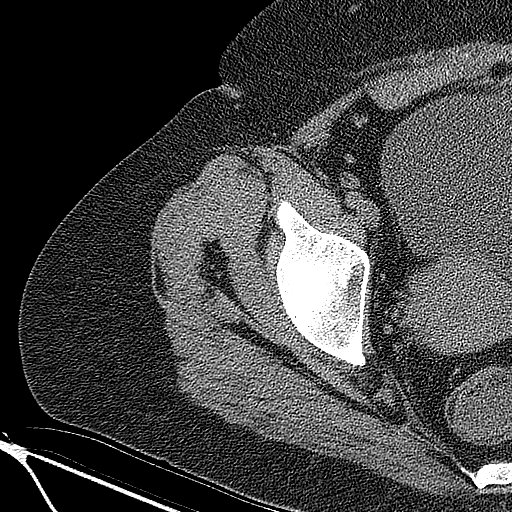
[im 49/67  lung]
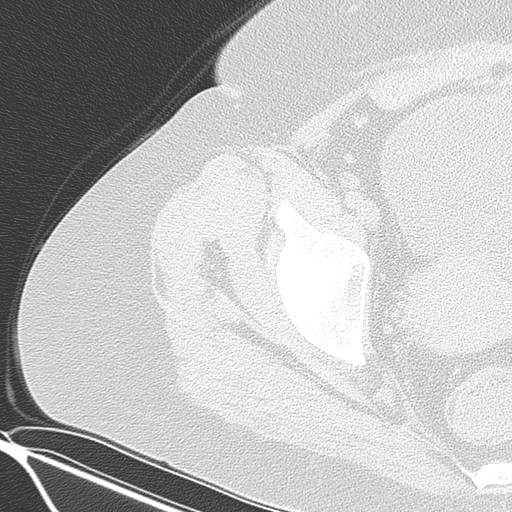
[im 53/67  soft-tissue]
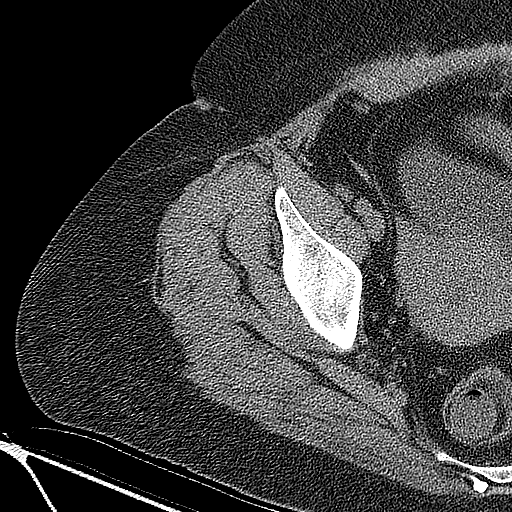
[im 53/67  lung]
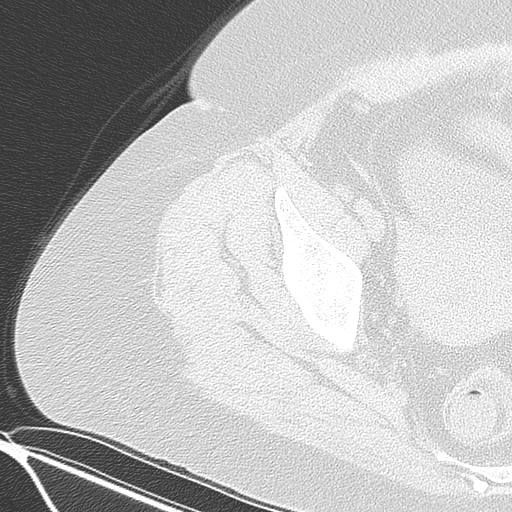
[im 58/67  soft-tissue]
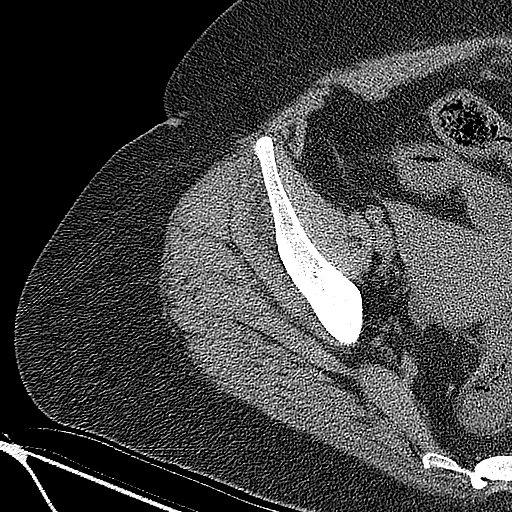
[im 58/67  lung]
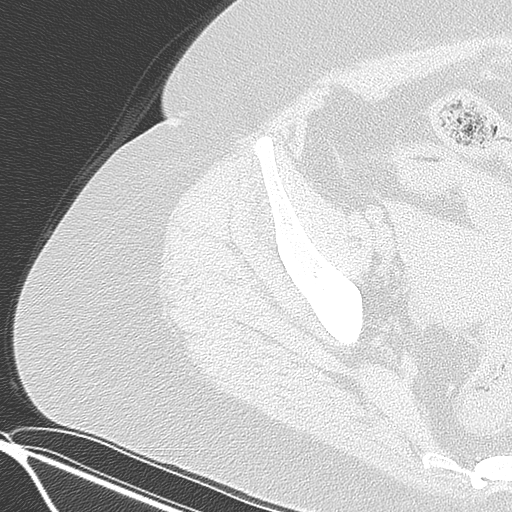
[im 62/67  soft-tissue]
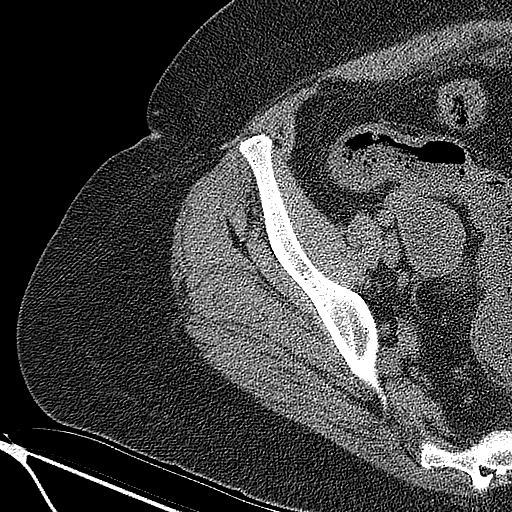
[im 62/67  lung]
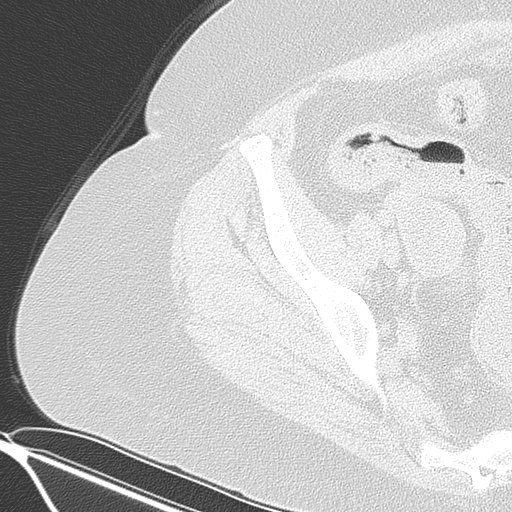

[13 of 32 positions shown; findings below may reference images not displayed]

FINDINGS: Bones/Joint/Cartilage

Three partially threaded screws traverse healing nondisplaced
basicervical fracture of the proximal right femur. No change in
fracture alignment compared to [DATE]. Sclerosis at the fracture
site indicative healing. Hardware is intact without lucency or new
fracture. Right hip joint space is maintained. No dislocation.
Remaining visualized osseous structures are intact. No visible hip
joint effusion.

Ligaments

Suboptimally assessed by CT.

Muscles and Tendons

Unremarkable muscle bulk.  Tendinous structures grossly intact.

Soft tissues

Prior surgical tract over the lateral aspect of the hip. No
postsurgical fluid collections. No hematoma.
IMPRESSION: Healing basicervical fracture of the proximal right femur status
post ORIF. No new or acute osseous findings.

## 2019-06-18 MED ORDER — AZATHIOPRINE 50 MG TABLET
ORAL_TABLET | Freq: Every day | ORAL | 0 refills | 30 days | Status: CP
Start: 2019-06-18 — End: 2020-06-17

## 2019-06-18 MED FILL — HYDROXYZINE HCL 50 MG TABLET: 30 days supply | Qty: 90 | Fill #0 | Status: AC

## 2019-06-19 ENCOUNTER — Encounter: Payer: Self-pay | Admitting: Physical Medicine and Rehabilitation

## 2019-06-21 ENCOUNTER — Other Ambulatory Visit: Payer: Self-pay | Admitting: Orthopedic Surgery

## 2019-06-21 DIAGNOSIS — M545 Low back pain, unspecified: Secondary | ICD-10-CM

## 2019-06-24 MED ORDER — HYOSCYAMINE ER 0.375 MG TABLET,EXTENDED RELEASE,12 HR
ORAL_TABLET | Freq: Two times a day (BID) | ORAL | 5 refills | 30 days | Status: CP
Start: 2019-06-24 — End: 2020-06-23

## 2019-07-01 ENCOUNTER — Other Ambulatory Visit: Payer: Self-pay | Admitting: Neurology

## 2019-07-01 ENCOUNTER — Encounter: Payer: Medicaid Other | Admitting: Physical Medicine and Rehabilitation

## 2019-07-03 DIAGNOSIS — F329 Major depressive disorder, single episode, unspecified: Principal | ICD-10-CM

## 2019-07-03 DIAGNOSIS — F411 Generalized anxiety disorder: Principal | ICD-10-CM

## 2019-07-09 ENCOUNTER — Encounter: Payer: Self-pay | Admitting: Physical Medicine and Rehabilitation

## 2019-07-09 ENCOUNTER — Other Ambulatory Visit: Payer: Self-pay

## 2019-07-09 ENCOUNTER — Encounter
Payer: Medicaid Other | Attending: Physical Medicine and Rehabilitation | Admitting: Physical Medicine and Rehabilitation

## 2019-07-09 VITALS — BP 141/103 | HR 126 | Temp 98.9°F | Ht 63.0 in | Wt 163.0 lb

## 2019-07-09 DIAGNOSIS — M7918 Myalgia, other site: Secondary | ICD-10-CM | POA: Diagnosis present

## 2019-07-09 DIAGNOSIS — Z96641 Presence of right artificial hip joint: Secondary | ICD-10-CM | POA: Diagnosis present

## 2019-07-09 DIAGNOSIS — M797 Fibromyalgia: Secondary | ICD-10-CM

## 2019-07-09 MED ORDER — PREGABALIN 100 MG PO CAPS: 100.0000 mg | ORAL_CAPSULE | Freq: Every day | ORAL | 0 refills | Status: DC

## 2019-07-09 NOTE — Patient Instructions (Signed)
A trigger point injection was performed at the site of maximal tenderness using 62mL of 1% plain Lidocaine. This was well tolerated.
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
## 2019-07-11 MED FILL — FAMOTIDINE 40 MG TABLET: 30 days supply | Qty: 30 | Fill #3 | Status: AC

## 2019-07-11 MED FILL — ESOMEPRAZOLE MAGNESIUM 40 MG CAPSULE,DELAYED RELEASE: ORAL | 30 days supply | Qty: 60 | Fill #1

## 2019-07-11 MED FILL — ESOMEPRAZOLE MAGNESIUM 40 MG CAPSULE,DELAYED RELEASE: 30 days supply | Qty: 60 | Fill #1 | Status: AC

## 2019-07-11 MED FILL — FAMOTIDINE 40 MG TABLET: ORAL | 30 days supply | Qty: 30 | Fill #3

## 2019-07-12 ENCOUNTER — Telehealth: Payer: Self-pay

## 2019-07-12 MED ORDER — PREGABALIN 100 MG PO CAPS: 100.0000 mg | ORAL_CAPSULE | Freq: Every day | ORAL | 0 refills | Status: DC

## 2019-07-12 NOTE — Telephone Encounter (Signed)
Patient called and said Pharmacy called and states Dr. Ranell Patrick not in network yet with Medicaid - can someone call in her Lyrica RX'd please  Her number is [phone] to let her know this is complete--- Dr. Letta Pate - can you help send this RX over until Dr. Ranell Patrick gets credentialled??

## 2019-07-12 NOTE — Telephone Encounter (Signed)
Notified. She requested spelling of MD's name.

## 2019-07-15 ENCOUNTER — Ambulatory Visit
Admission: RE | Admit: 2019-07-15 | Discharge: 2019-07-15 | Disposition: A | Discharge status: Home / Self Care | Payer: Medicaid Other | Source: Ambulatory Visit | Attending: Orthopedic Surgery | Admitting: Orthopedic Surgery

## 2019-07-15 DIAGNOSIS — M545 Low back pain, unspecified: Secondary | ICD-10-CM

## 2019-07-15 IMAGING — MR MR LUMBAR SPINE W/O CM
4 of 5 series · 25 of 48 positions shown · non-contrast
Comparison: Radiography [DATE]

CLINICAL DATA: Right hip and leg pain radiating distally. Weakness
and numbness.

EXAM:
MRI LUMBAR SPINE WITHOUT CONTRAST
TECHNIQUE: Multiplanar, multisequence MR imaging of the lumbar spine was
performed. No intravenous contrast was administered.

[Series 2: T2 · sagittal · 4.0mm · 0.55mm/px · 7 of 16 slices shown (1 of 2)]
[im 1/16]
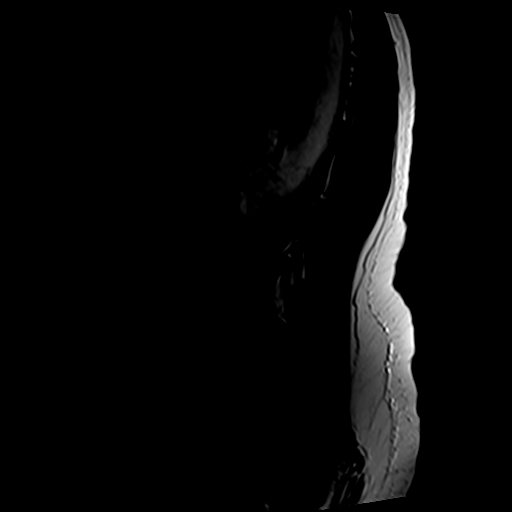
[im 3/16]
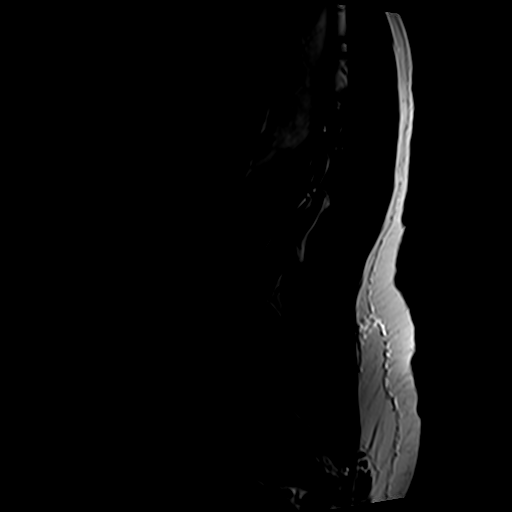
[im 6/16]
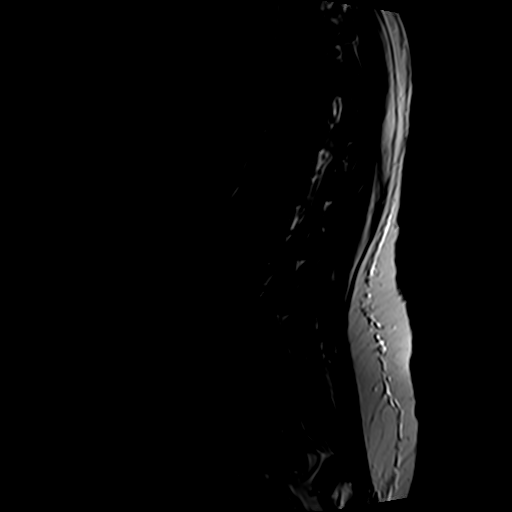
[im 8/16]
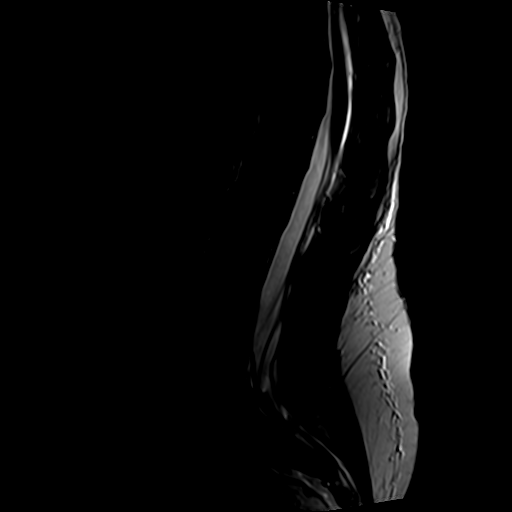
[im 11/16]
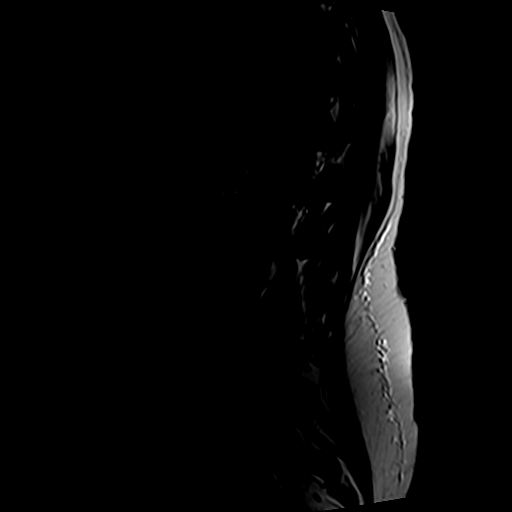
[im 13/16]
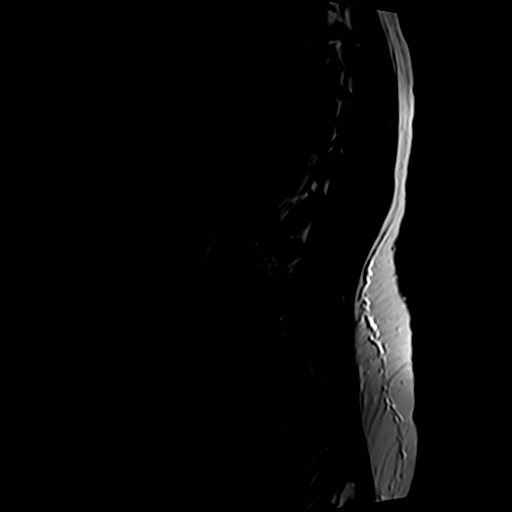
[im 16/16]
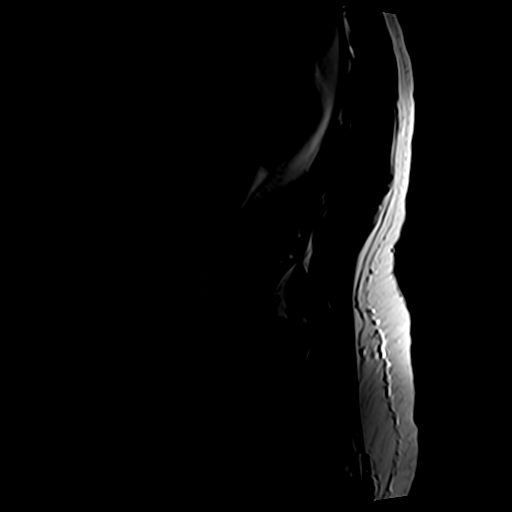

[Series 4: T1 · sagittal · 4.0mm · 0.55mm/px · 6 of 16 slices shown (1 of 2)]
[im 1/16]
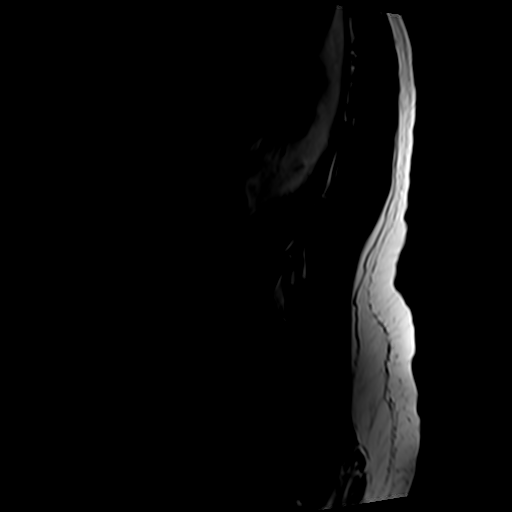
[im 4/16]
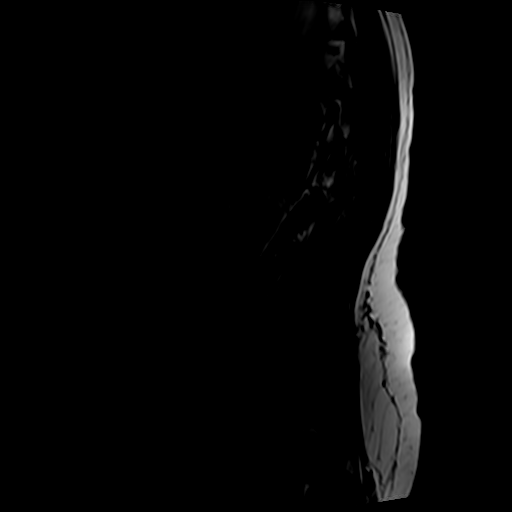
[im 7/16]
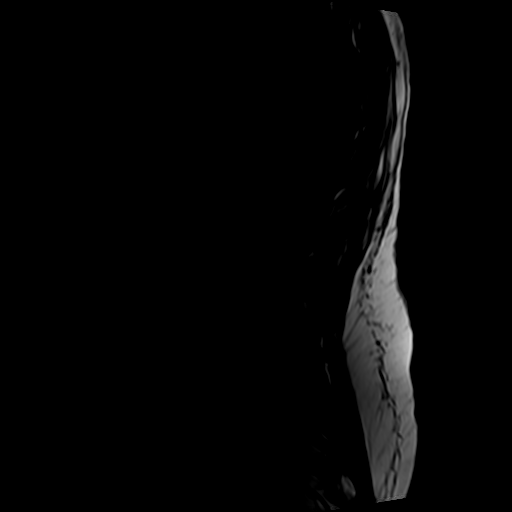
[im 10/16]
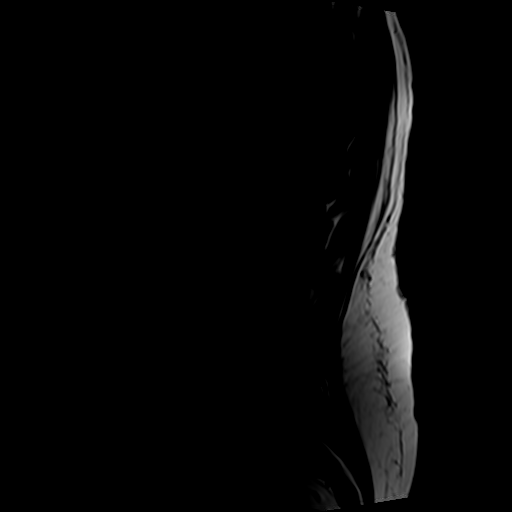
[im 13/16]
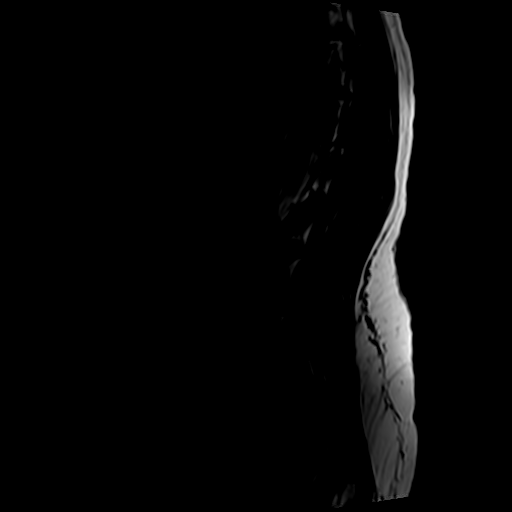
[im 16/16]
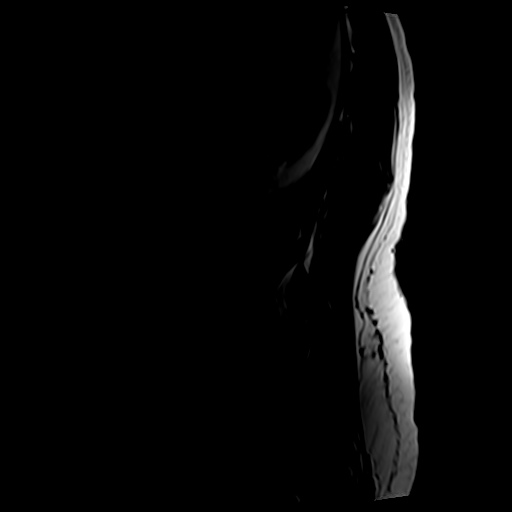

[Series 5: T2 · axial · 4.0mm · 0.70mm/px · z∈[-45,+162]mm · 8 of 36 slices shown (2 of 2)]
[im 1/36]
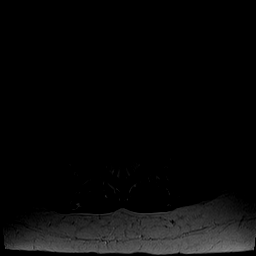
[im 6/36]
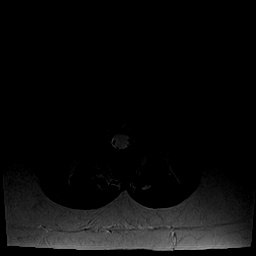
[im 11/36]
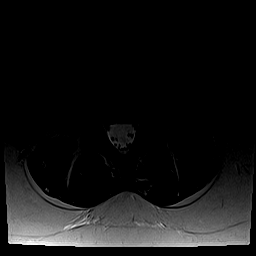
[im 17/36]
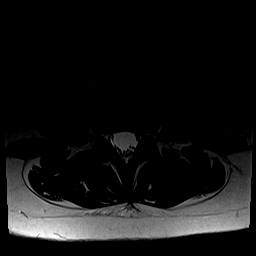
[im 19/36]
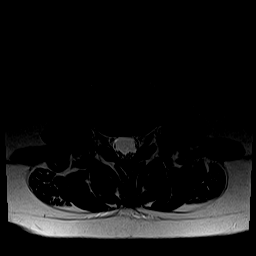
[im 25/36]
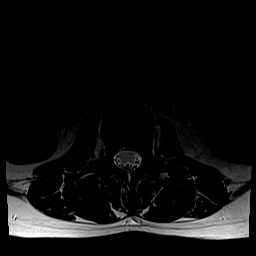
[im 30/36]
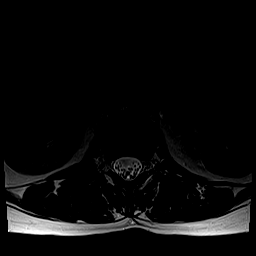
[im 36/36]
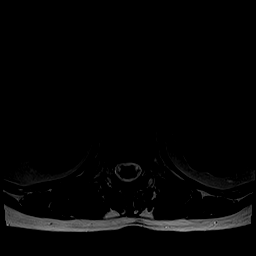

[Series 6: T1 · axial · 4.0mm · 0.35mm/px · z∈[-45,+131]mm · 4 of 36 slices shown (2 of 2)]
[im 1/36]
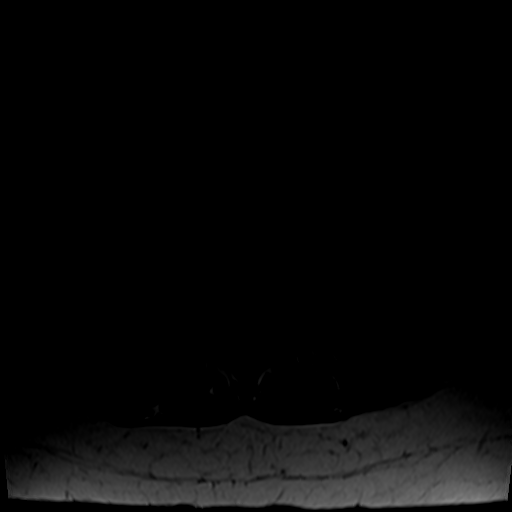
[im 6/36]
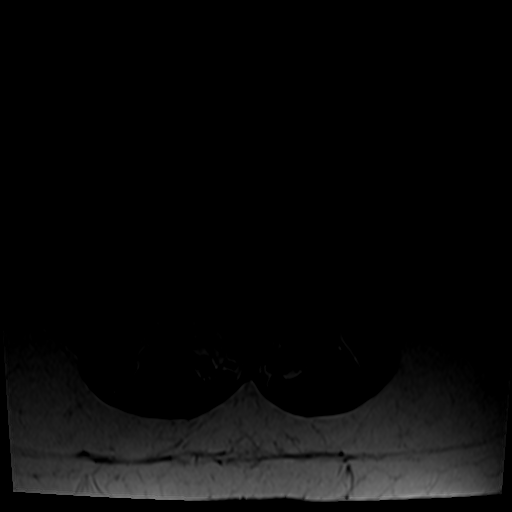
[im 19/36]
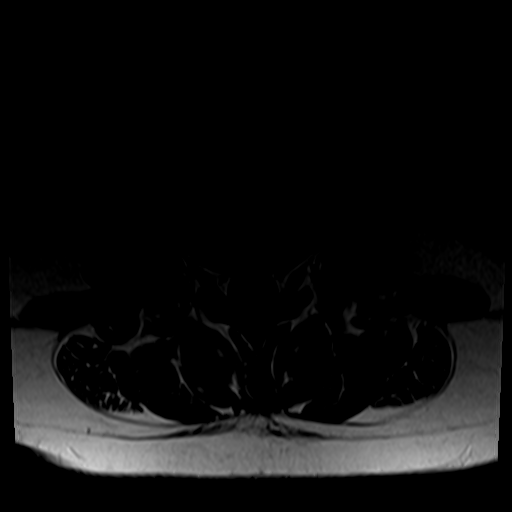
[im 30/36]
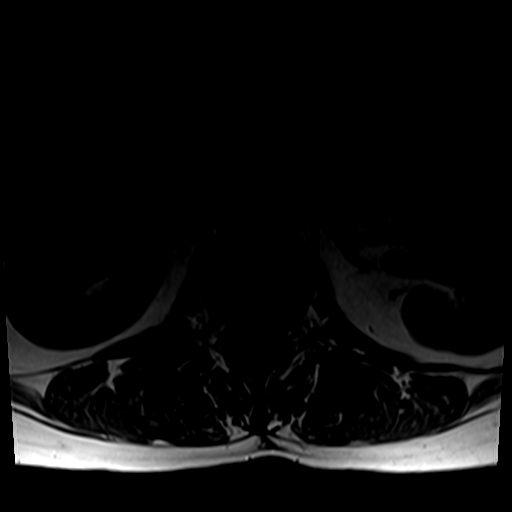

[25 of 48 positions shown; findings below may reference images not displayed]

FINDINGS: Segmentation:  5 lumbar type vertebral bodies.

Alignment:  Normal

Vertebrae: Old minor superior endplate deformities at T11 and T12,
healed.

Conus medullaris and cauda equina: Conus extends to the L1 level.
Conus and cauda equina appear normal.

Paraspinal and other soft tissues: Cyst in the right pelvis
measuring up to 4.3 cm in diameter. This was not primarily or
completely evaluated.

Disc levels:

No significant degenerative disease in the lumbar region. The discs
show normal signal characteristics and morphology. There is wide
patency of the canal and foramina. No significant osseous or
articular finding.
IMPRESSION: Normal examination of the lumbar spine. No abnormality seen to
explain the presenting symptoms.

Old minor healed endplate deformities at T11 and T12.

Cyst in the right pelvis measuring up to 4.3 cm in diameter, not
primarily or completely evaluated. This is presumably a functional
ovarian cyst.

## 2019-07-17 ENCOUNTER — Encounter: Admit: 2019-07-17 | Discharge: 2019-07-17 | Payer: MEDICAID | Attending: Family Medicine | Primary: Family Medicine

## 2019-07-17 ENCOUNTER — Ambulatory Visit: Admit: 2019-07-17 | Discharge: 2019-07-18 | Payer: MEDICAID

## 2019-07-17 DIAGNOSIS — Z01818 Encounter for other preprocedural examination: Principal | ICD-10-CM

## 2019-07-17 MED ORDER — AZATHIOPRINE 50 MG TABLET
ORAL_TABLET | 0 refills | 0 days | Status: CP
Start: 2019-07-17 — End: ?

## 2019-07-17 MED FILL — HYDROXYZINE HCL 50 MG TABLET: 30 days supply | Qty: 90 | Fill #1 | Status: AC

## 2019-07-17 MED FILL — HYDROXYZINE HCL 50 MG TABLET: ORAL | 30 days supply | Qty: 90 | Fill #1

## 2019-07-19 ENCOUNTER — Ambulatory Visit: Admit: 2019-07-19 | Discharge: 2019-07-19 | Payer: MEDICAID

## 2019-07-19 ENCOUNTER — Encounter: Admit: 2019-07-19 | Discharge: 2019-07-19 | Payer: MEDICAID | Attending: Anesthesiology | Primary: Anesthesiology

## 2019-07-19 MED ORDER — HYOSCYAMINE 0.125 MG SUBLINGUAL TABLET
ORAL_TABLET | Freq: Four times a day (QID) | SUBLINGUAL | 1 refills | 23 days | Status: CP | PRN
Start: 2019-07-19 — End: ?

## 2019-07-19 MED ORDER — HYOSCYAMINE ER 0.375 MG TABLET,EXTENDED RELEASE,12 HR
ORAL_TABLET | Freq: Two times a day (BID) | ORAL | 2 refills | 30.00000 days | Status: CP | PRN
Start: 2019-07-19 — End: 2020-07-18

## 2019-07-19 NOTE — Patient Instructions (Addendum)
DUE TO COVID-19 ONLY ONE VISITOR IS ALLOWED TO COME WITH YOU AND STAY IN THE WAITING ROOM ONLY DURING PRE OP AND PROCEDURE DAY OF SURGERY. THE 1 VISITOR MAY VISIT WITH YOU AFTER SURGERY IN YOUR PRIVATE ROOM DURING VISITING HOURS ONLY!  YOU NEED TO HAVE A COVID 19 TEST ON__12/14_____ @_______ , THIS TEST MUST BE DONE BEFORE SURGERY, COME  [place], [place] , [id number].  ([place]) ONCE YOUR COVID TEST IS COMPLETED, PLEASE BEGIN THE QUARANTINE INSTRUCTIONS AS OUTLINED IN YOUR HANDOUT.                Reita Cliche    Your procedure is scheduled on: 07/25/19   Report to [facility] Main  Entrance   Report to Short Stay at 5:30 AM     Call this number if you have problems the morning of surgery [place], NO CHEWING GUM [place].    Do not eat food After Midnight.   YOU MAY HAVE CLEAR LIQUIDS FROM MIDNIGHT UNTIL 4:30AM.   At 4:30AM Please finish the prescribed Pre-Surgery  drink.   Nothing by mouth after you finish the  drink !   Take these medicines the morning of surgery with A SIP OF WATER: gabapentin, Lyrica, Pramipexole, Milnacipran, pepcid,nexium,azathioprine                                 You may not have any metal on your body including hair pins and              piercings  Do not wear jewelry, make-up, lotions, powders or perfumes, deodorant             Do not wear nail polish on your fingernails.  Do not shave  48 hours prior to surgery.            Do not bring valuables to the hospital. [place].  Contacts, dentures or bridgework may not be worn into surgery.       Patients discharged the day of surgery will not be allowed to drive home  . IF YOU ARE HAVING SURGERY AND GOING HOME THE SAME DAY, YOU MUST HAVE AN ADULT TO DRIVE YOU HOME AND BE WITH YOU FOR 24 HOURS.   YOU MAY GO HOME BY TAXI OR UBER OR  ORTHERWISE, BUT AN ADULT MUST ACCOMPANY YOU HOME AND STAY WITH YOU FOR 24 HOURS.  Name and phone number of your driver:  Special Instructions: N/A              Please read over the following fact sheets you were given: _____________________________________________________________________             [facility] - Preparing for Surgery  Before surgery, you can play an important role .  Because skin is not sterile, your skin needs to be as free of germs as possible.   You can reduce the number of germs on your skin by washing with CHG (chlorahexidine gluconate) soap before surgery .  CHG is an antiseptic cleaner which kills germs and bonds with the skin to continue killing germs even after washing. Please DO NOT use if you have an allergy to CHG or antibacterial soaps.  If your skin becomes reddened/irritated stop using the CHG and inform your nurse when you arrive at Short Stay. Do not shave (including legs and underarms) for at least 48 hours prior to the first CHG shower.   Please follow these instructions carefully:  1.  Shower with CHG Soap the night before surgery and the  morning of Surgery.  2.  If you choose to wash your hair, wash your hair first as usual with your  normal  shampoo.  3.  After you shampoo, rinse your hair and body thoroughly to remove the  shampoo.                                        4.  Use CHG as you would any other liquid soap.  You can apply chg directly  to the skin and wash                       Gently with a scrungie or clean washcloth.  5.  Apply the CHG Soap to your body ONLY FROM THE NECK DOWN.   Do not use on face/ open                           Wound or open sores. Avoid contact with eyes, ears mouth and genitals (private parts).                       Wash face,  Genitals (private parts) with your normal soap.             6.  Wash thoroughly, paying special attention to the area where your surgery  will be performed.  7.  Thoroughly rinse your  body with warm water from the neck down.  8.  DO NOT shower/wash with your normal soap after using and rinsing off  the CHG Soap.             9.  Pat yourself dry with a clean towel.            10.  Wear clean pajamas.            11.  Place clean sheets on your bed the night of your first shower and do not  sleep with pets. Day of Surgery : Do not apply any lotions/deodorants the morning of surgery.  Please wear clean clothes to the hospital/surgery center.  FAILURE TO FOLLOW THESE INSTRUCTIONS MAY RESULT IN THE CANCELLATION OF YOUR SURGERY PATIENT SIGNATURE_________________________________  NURSE SIGNATURE__________________________________  ________________________________________________________________________   Pt was told over the phone that she needs to come to admitting after her covid test and check in for lab and EKG as well as  To pick up soap and pre op drink ans instructions. She was told that she was not able to bring her child in the hospital for this visit.

## 2019-07-22 ENCOUNTER — Encounter (HOSPITAL_COMMUNITY): Payer: Self-pay

## 2019-07-22 ENCOUNTER — Inpatient Hospital Stay (HOSPITAL_COMMUNITY): Admission: RE | Admit: 2019-07-22 | Payer: Medicaid Other | Source: Ambulatory Visit

## 2019-07-22 ENCOUNTER — Other Ambulatory Visit (HOSPITAL_COMMUNITY)
Admission: RE | Admit: 2019-07-22 | Discharge: 2019-07-22 | Disposition: A | Discharge status: Home / Self Care | Payer: Medicaid Other | Source: Ambulatory Visit | Attending: Orthopedic Surgery | Admitting: Orthopedic Surgery

## 2019-07-22 ENCOUNTER — Other Ambulatory Visit: Payer: Self-pay

## 2019-07-22 ENCOUNTER — Encounter (HOSPITAL_COMMUNITY)
Admission: RE | Admit: 2019-07-22 | Discharge: 2019-07-22 | Disposition: A | Discharge status: Home / Self Care | Payer: Medicaid Other | Source: Ambulatory Visit | Attending: Orthopedic Surgery | Admitting: Orthopedic Surgery

## 2019-07-22 DIAGNOSIS — Z9049 Acquired absence of other specified parts of digestive tract: Secondary | ICD-10-CM | POA: Insufficient documentation

## 2019-07-22 DIAGNOSIS — S72001A Fracture of unspecified part of neck of right femur, initial encounter for closed fracture: Secondary | ICD-10-CM | POA: Insufficient documentation

## 2019-07-22 DIAGNOSIS — K5090 Crohn's disease, unspecified, without complications: Secondary | ICD-10-CM | POA: Diagnosis not present

## 2019-07-22 DIAGNOSIS — F419 Anxiety disorder, unspecified: Secondary | ICD-10-CM | POA: Diagnosis not present

## 2019-07-22 DIAGNOSIS — E538 Deficiency of other specified B group vitamins: Secondary | ICD-10-CM | POA: Insufficient documentation

## 2019-07-22 DIAGNOSIS — Z87891 Personal history of nicotine dependence: Secondary | ICD-10-CM | POA: Diagnosis not present

## 2019-07-22 DIAGNOSIS — E519 Thiamine deficiency, unspecified: Secondary | ICD-10-CM | POA: Diagnosis not present

## 2019-07-22 DIAGNOSIS — Z01818 Encounter for other preprocedural examination: Secondary | ICD-10-CM | POA: Insufficient documentation

## 2019-07-22 DIAGNOSIS — M1990 Unspecified osteoarthritis, unspecified site: Secondary | ICD-10-CM | POA: Diagnosis not present

## 2019-07-22 DIAGNOSIS — M797 Fibromyalgia: Secondary | ICD-10-CM | POA: Insufficient documentation

## 2019-07-22 DIAGNOSIS — G8929 Other chronic pain: Secondary | ICD-10-CM | POA: Diagnosis not present

## 2019-07-22 DIAGNOSIS — I10 Essential (primary) hypertension: Secondary | ICD-10-CM | POA: Insufficient documentation

## 2019-07-22 DIAGNOSIS — F1021 Alcohol dependence, in remission: Secondary | ICD-10-CM | POA: Insufficient documentation

## 2019-07-22 DIAGNOSIS — F1121 Opioid dependence, in remission: Secondary | ICD-10-CM | POA: Diagnosis not present

## 2019-07-22 DIAGNOSIS — K219 Gastro-esophageal reflux disease without esophagitis: Secondary | ICD-10-CM | POA: Diagnosis not present

## 2019-07-22 DIAGNOSIS — D649 Anemia, unspecified: Secondary | ICD-10-CM | POA: Insufficient documentation

## 2019-07-22 DIAGNOSIS — Z79899 Other long term (current) drug therapy: Secondary | ICD-10-CM | POA: Insufficient documentation

## 2019-07-22 NOTE — Progress Notes (Signed)
PCP - Dr. Hayes Ludwig Cardiologist - no  Chest x-ray - 01/26/19 EKG - 07/22/19 Stress Test - no ECHO - no Cardiac Cath - no  Sleep Study - no CPAP -   Fasting Blood Sugar - NA Checks Blood Sugar _____ times a day  Blood Thinner Instructions:NA Aspirin Instructions: Last Dose:  Anesthesia review:   Patient denies shortness of breath, fever, cough and chest pain at PAT appointment  PAT visit was over the phone. She was instructed to come after the covid test for lab draw and EKG. She will get a bag with instructions , drink and soap.  Patient verbalized understanding of instructions that were given to them at the PAT appointment. Patient was also instructed that they will need to review over the PAT instructions again at home before surgery. yes

## 2019-07-23 LAB — NOVEL CORONAVIRUS, NAA (HOSP ORDER, SEND-OUT TO REF LAB; TAT 18-24 HRS): SARS-CoV-2, NAA: NOT DETECTED
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
## 2019-07-24 ENCOUNTER — Encounter (HOSPITAL_COMMUNITY): Payer: Self-pay | Admitting: Orthopedic Surgery

## 2019-07-24 NOTE — Anesthesia Preprocedure Evaluation (Addendum)
Anesthesia Evaluation  Patient identified by MRN, date of birth, ID band Patient awake    Reviewed: Allergy & Precautions, NPO status , Patient's Chart, lab work & pertinent test results  History of Anesthesia Complications Negative for: history of anesthetic complications  Airway Mallampati: II  TM Distance: >3 FB Neck ROM: Full    Dental  (+) Poor Dentition, Missing, Chipped,    Pulmonary asthma , former smoker,    Pulmonary exam normal        Cardiovascular hypertension, Pt. on medications Normal cardiovascular exam     Neuro/Psych  Headaches, Seizures - (x1 in 10/2018),  Anxiety    GI/Hepatic hiatal hernia, PUD, GERD  ,Hx of opiate and EtOH abuse Crohn's dx   Endo/Other  negative endocrine ROS  Renal/GU negative Renal ROS  negative genitourinary   Musculoskeletal  (+) Arthritis , Fibromyalgia -Right hip fx   Abdominal   Peds  Hematology negative hematology ROS (+)   Anesthesia Other Findings Day of surgery medications reviewed with patient.  Reproductive/Obstetrics negative OB ROS                            Anesthesia Physical Anesthesia Plan  ASA: III  Anesthesia Plan: General   Post-op Pain Management:    Induction: Intravenous  PONV Risk Score and Plan: 4 or greater and Treatment may vary due to age or medical condition, Ondansetron, Dexamethasone and Midazolam  Airway Management Planned: Oral ETT  Additional Equipment: None  Intra-op Plan:   Post-operative Plan: Extubation in OR  Informed Consent: I have reviewed the patients History and Physical, chart, labs and discussed the procedure including the risks, benefits and alternatives for the proposed anesthesia with the patient or authorized representative who has indicated his/her understanding and acceptance.     Dental advisory given  Plan Discussed with: CRNA  Anesthesia Plan Comments:         Anesthesia Quick Evaluation

## 2019-07-25 ENCOUNTER — Ambulatory Visit (HOSPITAL_COMMUNITY): Payer: Medicaid Other

## 2019-07-25 ENCOUNTER — Ambulatory Visit (HOSPITAL_COMMUNITY)
Admission: RE | Admit: 2019-07-25 | Discharge: 2019-07-25 | Disposition: A | Discharge status: Home / Self Care | Payer: Medicaid Other | Attending: Orthopedic Surgery | Admitting: Orthopedic Surgery

## 2019-07-25 ENCOUNTER — Encounter (HOSPITAL_COMMUNITY)
Admission: RE | Disposition: A | Discharge status: Home / Self Care | Payer: Self-pay | Source: Home / Self Care | Attending: Orthopedic Surgery

## 2019-07-25 ENCOUNTER — Encounter (HOSPITAL_COMMUNITY): Payer: Self-pay | Admitting: Orthopedic Surgery

## 2019-07-25 ENCOUNTER — Ambulatory Visit (HOSPITAL_COMMUNITY): Payer: Medicaid Other | Admitting: Physician Assistant

## 2019-07-25 ENCOUNTER — Ambulatory Visit (HOSPITAL_COMMUNITY): Payer: Medicaid Other | Admitting: Anesthesiology

## 2019-07-25 ENCOUNTER — Other Ambulatory Visit: Payer: Self-pay

## 2019-07-25 DIAGNOSIS — I10 Essential (primary) hypertension: Secondary | ICD-10-CM | POA: Insufficient documentation

## 2019-07-25 DIAGNOSIS — Z79899 Other long term (current) drug therapy: Secondary | ICD-10-CM | POA: Diagnosis not present

## 2019-07-25 DIAGNOSIS — T8484XA Pain due to internal orthopedic prosthetic devices, implants and grafts, initial encounter: Secondary | ICD-10-CM | POA: Diagnosis present

## 2019-07-25 DIAGNOSIS — K219 Gastro-esophageal reflux disease without esophagitis: Secondary | ICD-10-CM | POA: Insufficient documentation

## 2019-07-25 DIAGNOSIS — F419 Anxiety disorder, unspecified: Secondary | ICD-10-CM | POA: Diagnosis not present

## 2019-07-25 DIAGNOSIS — D649 Anemia, unspecified: Secondary | ICD-10-CM | POA: Diagnosis not present

## 2019-07-25 DIAGNOSIS — Z419 Encounter for procedure for purposes other than remedying health state, unspecified: Secondary | ICD-10-CM

## 2019-07-25 DIAGNOSIS — K5000 Crohn's disease of small intestine without complications: Secondary | ICD-10-CM | POA: Diagnosis not present

## 2019-07-25 DIAGNOSIS — Y831 Surgical operation with implant of artificial internal device as the cause of abnormal reaction of the patient, or of later complication, without mention of misadventure at the time of the procedure: Secondary | ICD-10-CM | POA: Diagnosis not present

## 2019-07-25 DIAGNOSIS — E538 Deficiency of other specified B group vitamins: Secondary | ICD-10-CM | POA: Diagnosis not present

## 2019-07-25 DIAGNOSIS — M797 Fibromyalgia: Secondary | ICD-10-CM | POA: Insufficient documentation

## 2019-07-25 DIAGNOSIS — Z87891 Personal history of nicotine dependence: Secondary | ICD-10-CM | POA: Insufficient documentation

## 2019-07-25 DIAGNOSIS — Z9889 Other specified postprocedural states: Secondary | ICD-10-CM

## 2019-07-25 DIAGNOSIS — Z8781 Personal history of (healed) traumatic fracture: Secondary | ICD-10-CM

## 2019-07-25 DIAGNOSIS — M25551 Pain in right hip: Secondary | ICD-10-CM | POA: Diagnosis present

## 2019-07-25 HISTORY — PX: HARDWARE REMOVAL: SHX979

## 2019-07-25 HISTORY — PX: PERCUTANEOUS PINNING: SHX2209

## 2019-07-25 LAB — CBC
HCT: 49.3 % — ABNORMAL HIGH (ref 36.0–46.0)
Hemoglobin: 15.2 g/dL — ABNORMAL HIGH (ref 12.0–15.0)
MCH: 29.3 pg (ref 26.0–34.0)
MCHC: 30.8 g/dL (ref 30.0–36.0)
MCV: 95.2 fL (ref 80.0–100.0)
Platelets: 437 10*3/uL — ABNORMAL HIGH (ref 150–400)
RBC: 5.18 MIL/uL — ABNORMAL HIGH (ref 3.87–5.11)
RDW: 19.4 % — ABNORMAL HIGH (ref 11.5–15.5)
WBC: 10.6 10*3/uL — ABNORMAL HIGH (ref 4.0–10.5)
nRBC: 0 % (ref 0.0–0.2)

## 2019-07-25 LAB — BASIC METABOLIC PANEL
Anion gap: 10 (ref 5–15)
BUN: 11 mg/dL (ref 6–20)
CO2: 18 mmol/L — ABNORMAL LOW (ref 22–32)
Calcium: 9.2 mg/dL (ref 8.9–10.3)
Chloride: 111 mmol/L (ref 98–111)
Creatinine, Ser: 0.99 mg/dL (ref 0.44–1.00)
GFR calc Af Amer: >60 mL/min (ref >60)
GFR calc non Af Amer: >60 mL/min (ref >60)
Glucose, Bld: 84 mg/dL (ref 70–99)
Potassium: 3.5 mmol/L (ref 3.5–5.1)
Sodium: 139 mmol/L (ref 135–145)

## 2019-07-25 LAB — PREGNANCY, URINE: Preg Test, Ur: NEGATIVE

## 2019-07-25 IMAGING — RF DG HIP (WITH PELVIS) OPERATIVE*R*
1 series · 2 of 2 positions shown · non-contrast
Comparison: [DATE]

CLINICAL DATA: Right hip replacement of screws

EXAM:
OPERATIVE RIGHT HIP (WITH PELVIS IF PERFORMED) 2 VIEWS
TECHNIQUE: Fluoroscopic spot image(s) were submitted for interpretation
post-operatively.

[Series 1: unknown protocol · 0.14mm/px · 2 of 2 slices shown]
[im 1/2]
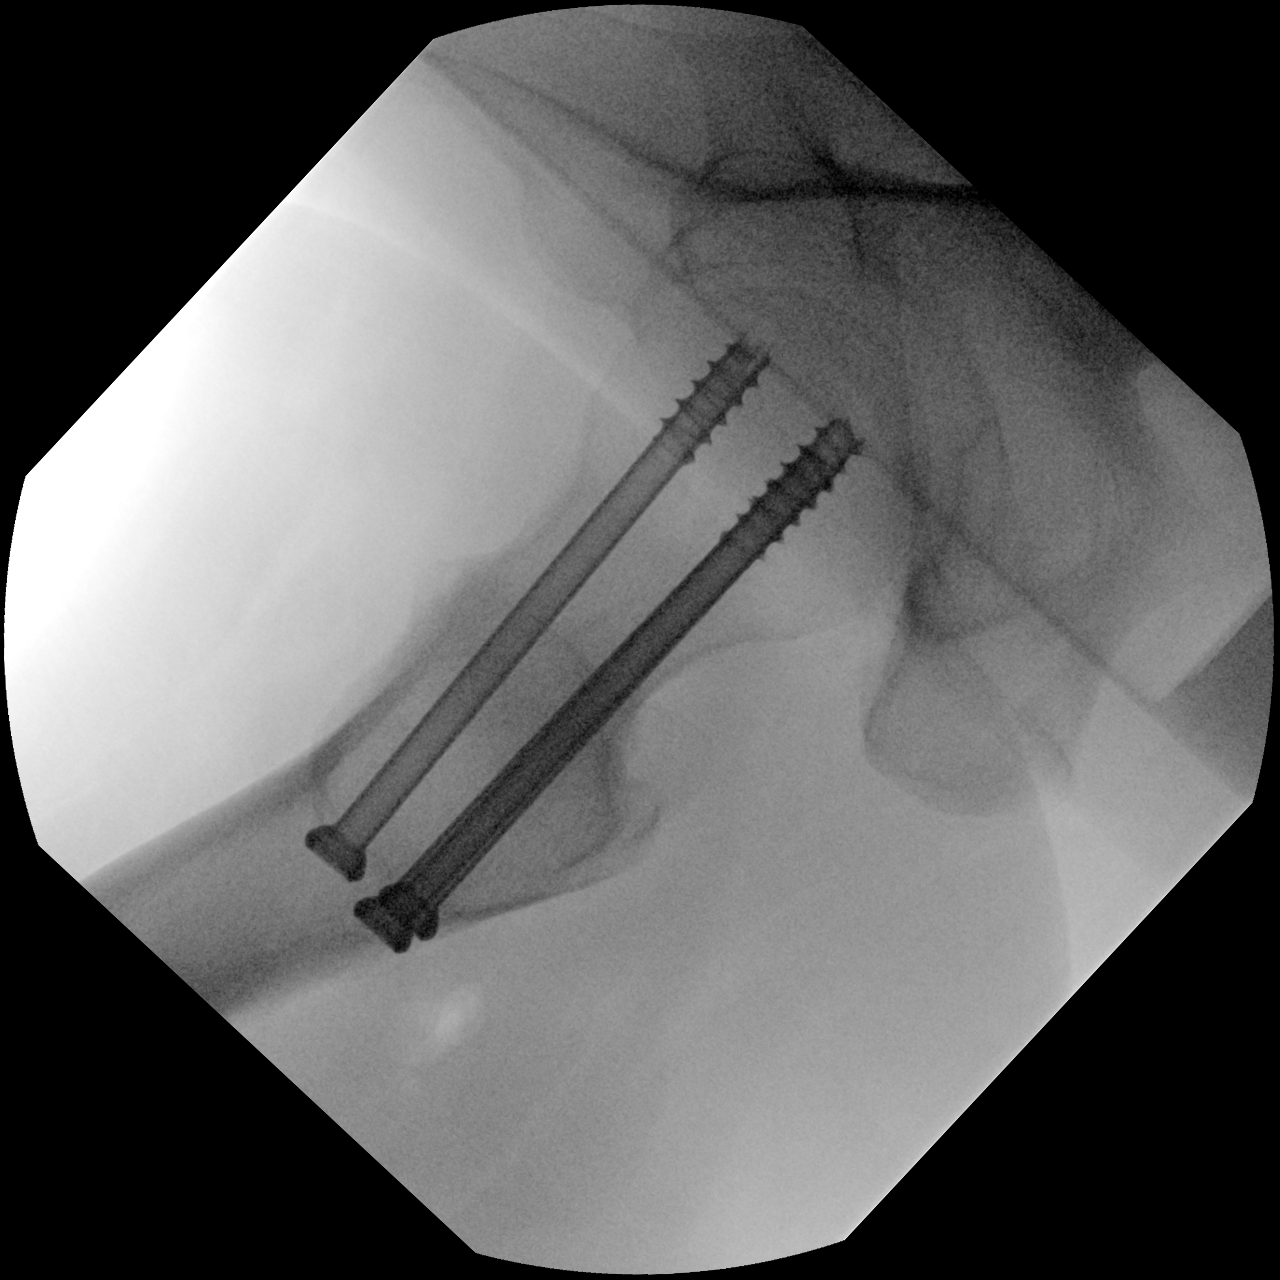
[im 2/2]
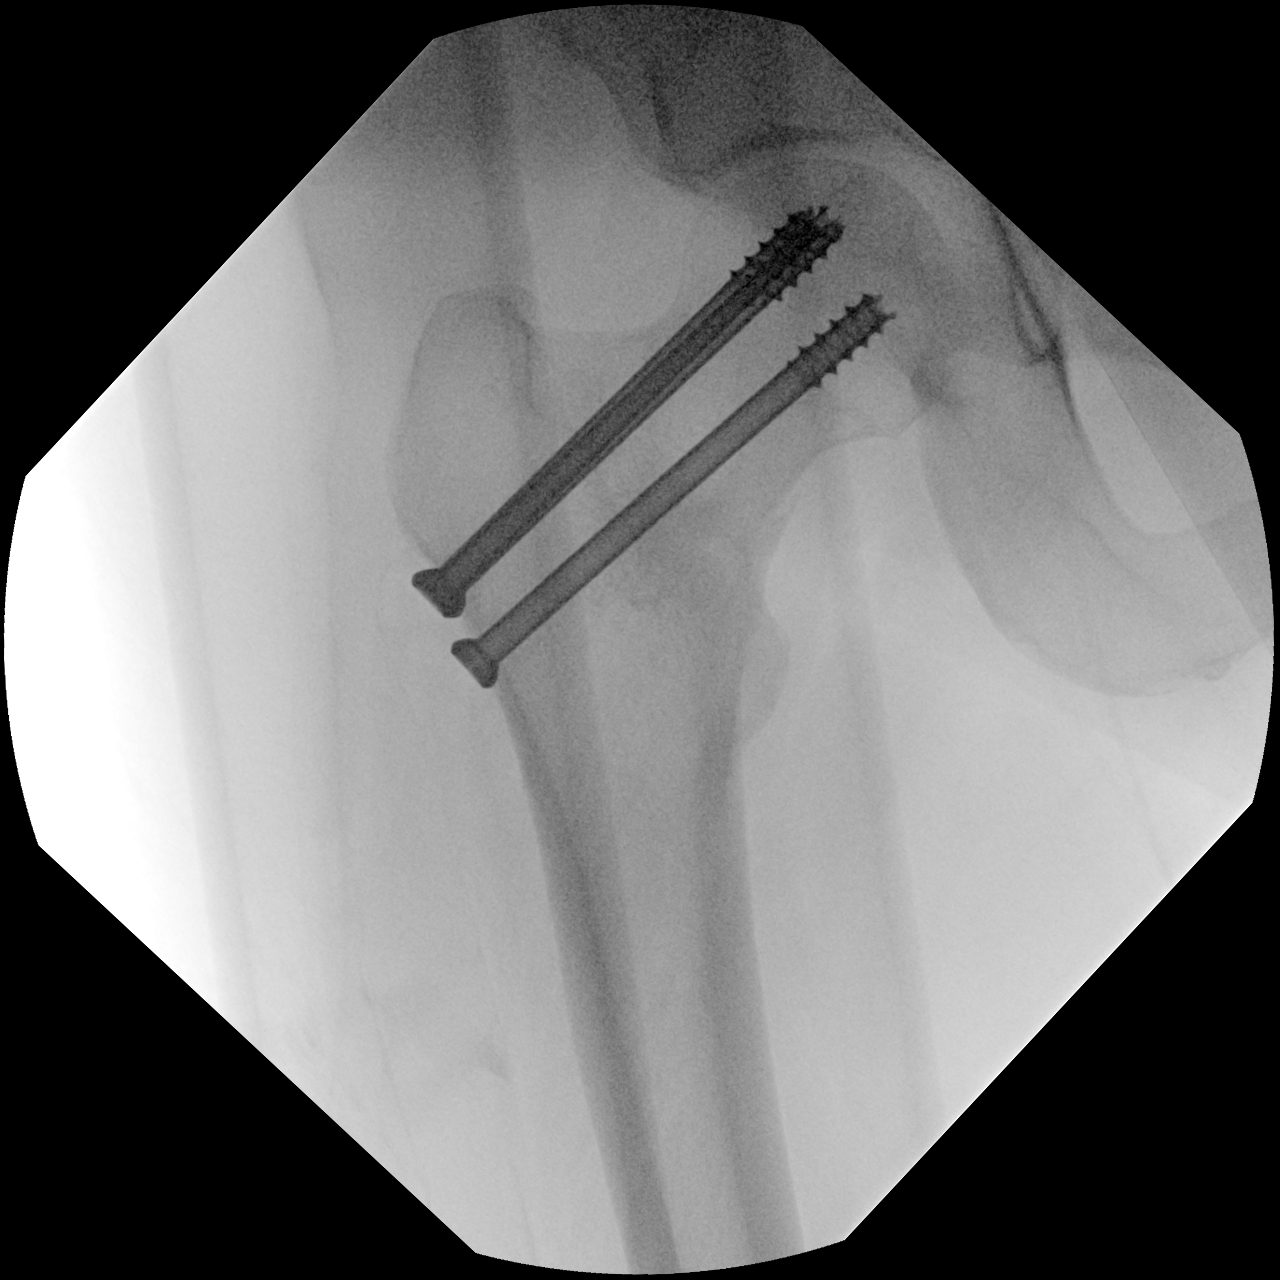

[2 of 2 positions shown; findings below may reference images not displayed]

FINDINGS: Intraoperative radiographs demonstrate 3 trochanteric screws
spanning the femoral neck and head. Screws appear shorter than the
prior.

Fluoroscopy time 26 seconds.
IMPRESSION: Post replacement of 3 femoral screws.

## 2019-07-25 IMAGING — DX DG PORTABLE PELVIS
1 series · 1 of 1 positions shown · non-contrast
Comparison: Earlier today

CLINICAL DATA: Status post right hip ORIF

EXAM:
PORTABLE PELVIS 1-2 VIEWS

[pelvis ap]
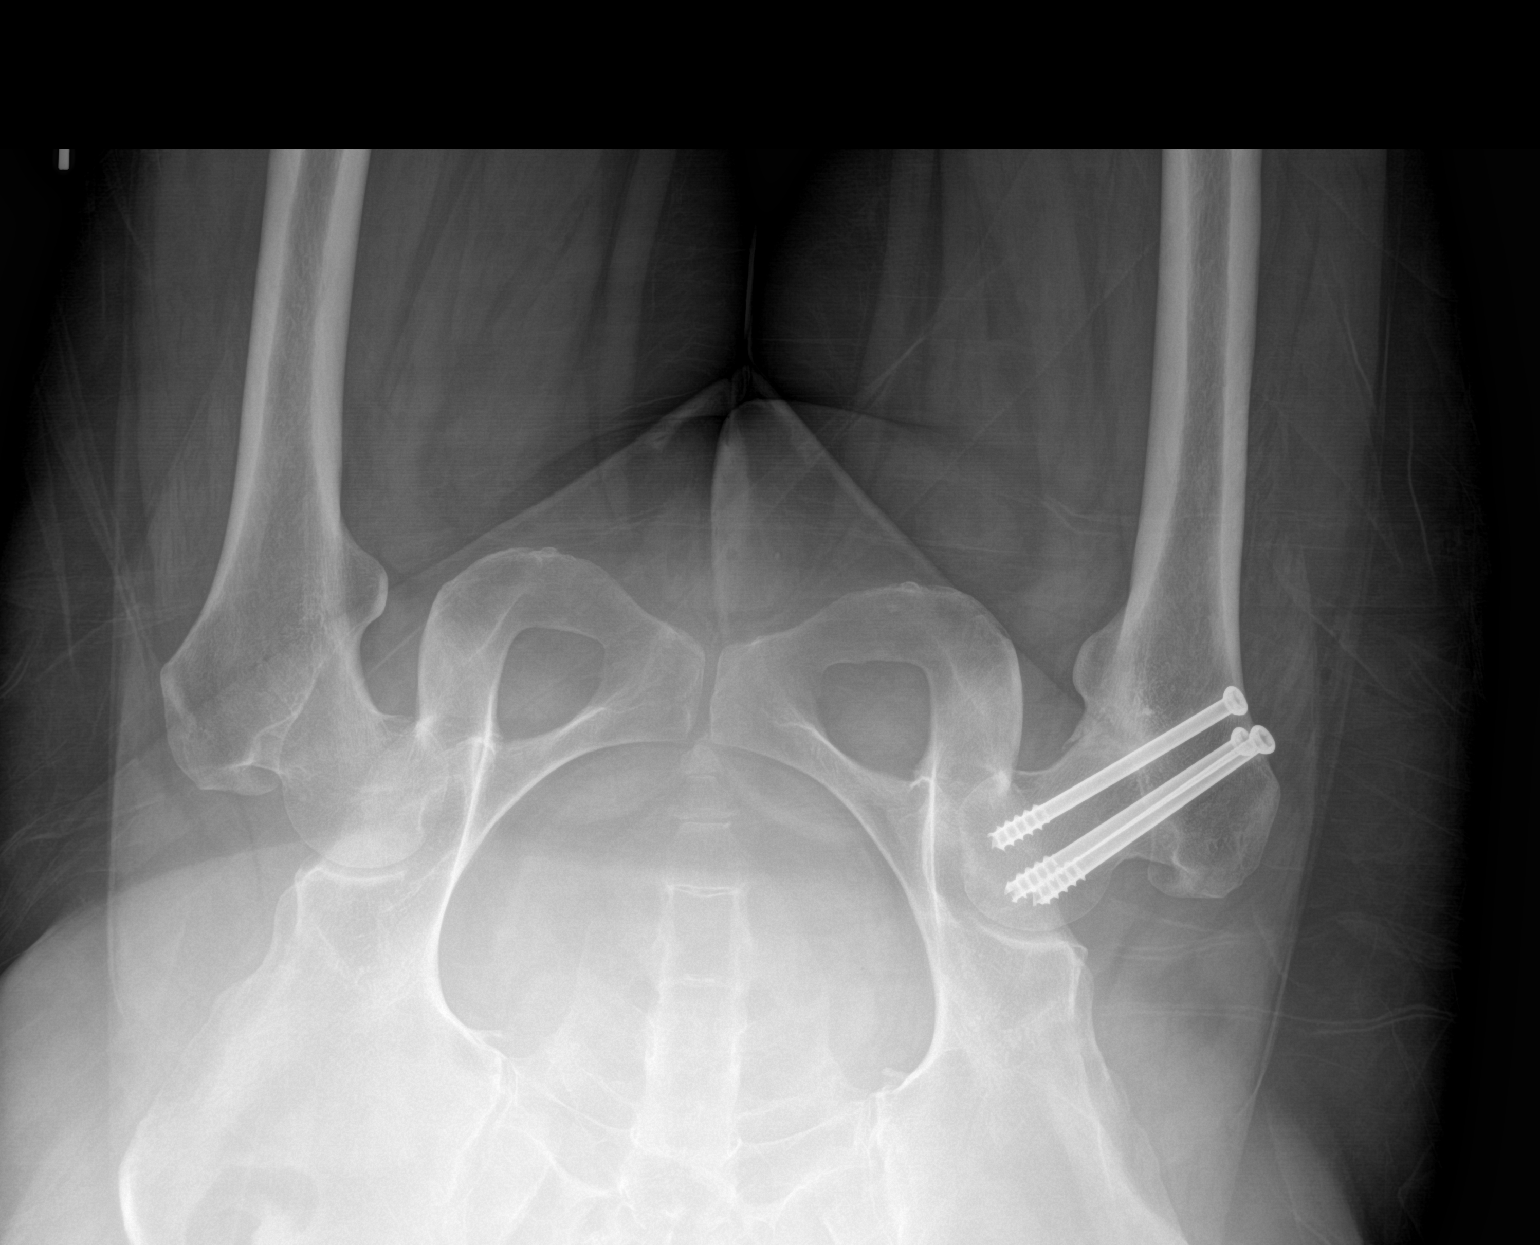

[1 of 1 positions shown; findings below may reference images not displayed]

FINDINGS: Status post ORIF of right hip fracture. There are 3 trochanteric
screws reducing the right femoral neck fracture. The fracture
fragments are in anatomic alignment.
IMPRESSION: 1. Status post ORIF of right hip fracture.

## 2019-07-25 SURGERY — PINNING, EXTREMITY, PERCUTANEOUS
Anesthesia: General | Site: Hip | Laterality: Right

## 2019-07-25 MED ORDER — SENNA 8.6 MG PO TABS: 1.0000 | ORAL_TABLET | Freq: Two times a day (BID) | ORAL | Status: DC

## 2019-07-25 MED ORDER — HYDROXYZINE PAMOATE 50 MG PO CAPS: 50.0000 mg | ORAL_CAPSULE | Freq: Three times a day (TID) | ORAL | Status: DC | PRN

## 2019-07-25 MED ORDER — LIDOCAINE HCL 2 % IJ SOLN
INTRAMUSCULAR | Status: AC
  Filled 2019-07-25: qty 20

## 2019-07-25 MED ORDER — FERROUS SULFATE 325 (65 FE) MG PO TABS: 325.0000 mg | ORAL_TABLET | Freq: Three times a day (TID) | ORAL | Status: DC

## 2019-07-25 MED ORDER — KETAMINE HCL 10 MG/ML IJ SOLN
INTRAMUSCULAR | Status: AC
  Filled 2019-07-25: qty 1

## 2019-07-25 MED ORDER — POTASSIUM CHLORIDE IN NACL 20-0.45 MEQ/L-% IV SOLN: INTRAVENOUS | Status: DC

## 2019-07-25 MED ORDER — PHENYLEPHRINE 40 MCG/ML (10ML) SYRINGE FOR IV PUSH (FOR BLOOD PRESSURE SUPPORT)
PREFILLED_SYRINGE | INTRAVENOUS | Status: DC | PRN
  Administered 2019-07-25 (×2): 80 ug via INTRAVENOUS

## 2019-07-25 MED ORDER — CHLORHEXIDINE GLUCONATE 4 % EX LIQD: 60.0000 mL | Freq: Once | CUTANEOUS | Status: DC

## 2019-07-25 MED ORDER — MAGNESIUM CITRATE PO SOLN: 1.0000 | Freq: Once | ORAL | Status: DC | PRN

## 2019-07-25 MED ORDER — OXYCODONE HCL 5 MG PO TABS
5.0000 mg | ORAL_TABLET | Freq: Once | ORAL | Status: AC | PRN
  Administered 2019-07-25: 5 mg via ORAL

## 2019-07-25 MED ORDER — MIDAZOLAM HCL 5 MG/5ML IJ SOLN
INTRAMUSCULAR | Status: DC | PRN
  Administered 2019-07-25: 2 mg via INTRAVENOUS

## 2019-07-25 MED ORDER — HYDROCODONE-ACETAMINOPHEN 5-325 MG PO TABS: 1.0000 | ORAL_TABLET | ORAL | Status: DC | PRN

## 2019-07-25 MED ORDER — SENNA-DOCUSATE SODIUM 8.6-50 MG PO TABS: 2.0000 | ORAL_TABLET | Freq: Every day | ORAL | 1 refills | Status: DC

## 2019-07-25 MED ORDER — FENTANYL CITRATE (PF) 250 MCG/5ML IJ SOLN
INTRAMUSCULAR | Status: AC
  Filled 2019-07-25: qty 5

## 2019-07-25 MED ORDER — BUPIVACAINE HCL (PF) 0.25 % IJ SOLN
INTRAMUSCULAR | Status: DC | PRN
  Administered 2019-07-25: 40 mL

## 2019-07-25 MED ORDER — DIPHENOXYLATE-ATROPINE 2.5-0.025 MG PO TABS: 1.0000 | ORAL_TABLET | Freq: Four times a day (QID) | ORAL | Status: DC | PRN

## 2019-07-25 MED ORDER — VANCOMYCIN HCL IN DEXTROSE 1-5 GM/200ML-% IV SOLN
1000.0000 mg | INTRAVENOUS | Status: AC
  Administered 2019-07-25: 1000 mg via INTRAVENOUS
  Filled 2019-07-25: qty 200

## 2019-07-25 MED ORDER — ONDANSETRON HCL 4 MG/2ML IJ SOLN: 4.0000 mg | Freq: Four times a day (QID) | INTRAMUSCULAR | Status: DC | PRN

## 2019-07-25 MED ORDER — MAGNESIUM OXIDE 400 MG PO TABS: 400.0000 mg | ORAL_TABLET | Freq: Every day | ORAL | Status: DC

## 2019-07-25 MED ORDER — OXYCODONE HCL 5 MG PO TABS: 5.0000 mg | ORAL_TABLET | ORAL | 0 refills | Status: DC | PRN

## 2019-07-25 MED ORDER — GABAPENTIN 400 MG PO CAPS
800.0000 mg | ORAL_CAPSULE | Freq: Four times a day (QID) | ORAL | Status: DC
  Filled 2019-07-25: qty 2

## 2019-07-25 MED ORDER — LIDOCAINE 2% (20 MG/ML) 5 ML SYRINGE
INTRAMUSCULAR | Status: DC | PRN
  Administered 2019-07-25: 100 mg via INTRAVENOUS

## 2019-07-25 MED ORDER — PROPOFOL 10 MG/ML IV BOLUS
INTRAVENOUS | Status: AC
  Filled 2019-07-25: qty 40

## 2019-07-25 MED ORDER — ROCURONIUM BROMIDE 10 MG/ML (PF) SYRINGE
PREFILLED_SYRINGE | INTRAVENOUS | Status: DC | PRN
  Administered 2019-07-25: 50 mg via INTRAVENOUS

## 2019-07-25 MED ORDER — MORPHINE SULFATE (PF) 4 MG/ML IV SOLN: 0.5000 mg | INTRAVENOUS | Status: DC | PRN

## 2019-07-25 MED ORDER — POLYETHYLENE GLYCOL 3350 17 G PO PACK: 17.0000 g | PACK | Freq: Every day | ORAL | Status: DC | PRN

## 2019-07-25 MED ORDER — DEXAMETHASONE SODIUM PHOSPHATE 10 MG/ML IJ SOLN
INTRAMUSCULAR | Status: AC
  Filled 2019-07-25: qty 3

## 2019-07-25 MED ORDER — CYCLOBENZAPRINE HCL 10 MG PO TABS
10.0000 mg | ORAL_TABLET | Freq: Three times a day (TID) | ORAL | Status: DC | PRN
  Filled 2019-07-25: qty 1

## 2019-07-25 MED ORDER — ACETAMINOPHEN 325 MG PO TABS: 325.0000 mg | ORAL_TABLET | Freq: Four times a day (QID) | ORAL | Status: DC | PRN

## 2019-07-25 MED ORDER — KETAMINE HCL 10 MG/ML IJ SOLN
INTRAMUSCULAR | Status: DC | PRN
  Administered 2019-07-25: 30 mg via INTRAVENOUS

## 2019-07-25 MED ORDER — ONDANSETRON HCL 4 MG PO TABS: 4.0000 mg | ORAL_TABLET | Freq: Four times a day (QID) | ORAL | Status: DC | PRN

## 2019-07-25 MED ORDER — HYDROMORPHONE HCL 1 MG/ML IJ SOLN
INTRAMUSCULAR | Status: AC
  Administered 2019-07-25: 0.5 mg via INTRAVENOUS
  Filled 2019-07-25: qty 1

## 2019-07-25 MED ORDER — BISACODYL 10 MG RE SUPP: 10.0000 mg | Freq: Every day | RECTAL | Status: DC | PRN

## 2019-07-25 MED ORDER — DEXAMETHASONE SODIUM PHOSPHATE 10 MG/ML IJ SOLN
INTRAMUSCULAR | Status: DC | PRN
  Administered 2019-07-25: 10 mg via INTRAVENOUS

## 2019-07-25 MED ORDER — HYDROMORPHONE HCL 1 MG/ML IJ SOLN
0.2500 mg | INTRAMUSCULAR | Status: DC | PRN
  Administered 2019-07-25 (×2): 0.5 mg via INTRAVENOUS

## 2019-07-25 MED ORDER — ALUM & MAG HYDROXIDE-SIMETH 200-200-20 MG/5ML PO SUSP: 30.0000 mL | ORAL | Status: DC | PRN

## 2019-07-25 MED ORDER — PROPOFOL 10 MG/ML IV BOLUS
INTRAVENOUS | Status: AC
  Filled 2019-07-25: qty 20

## 2019-07-25 MED ORDER — PHENYLEPHRINE 40 MCG/ML (10ML) SYRINGE FOR IV PUSH (FOR BLOOD PRESSURE SUPPORT)
PREFILLED_SYRINGE | INTRAVENOUS | Status: AC
  Filled 2019-07-25: qty 10

## 2019-07-25 MED ORDER — PROPOFOL 10 MG/ML IV BOLUS
INTRAVENOUS | Status: DC | PRN
  Administered 2019-07-25: 150 mg via INTRAVENOUS

## 2019-07-25 MED ORDER — POVIDONE-IODINE 10 % EX SWAB
2.0000 "application " | Freq: Once | CUTANEOUS | Status: AC
  Administered 2019-07-25: 2 via TOPICAL

## 2019-07-25 MED ORDER — LACTATED RINGERS IV SOLN: INTRAVENOUS | Status: DC | PRN

## 2019-07-25 MED ORDER — OXYCODONE HCL 5 MG PO TABS
ORAL_TABLET | ORAL | Status: AC
  Filled 2019-07-25: qty 1

## 2019-07-25 MED ORDER — OXYCODONE HCL 5 MG/5ML PO SOLN: 5.0000 mg | Freq: Once | ORAL | Status: AC | PRN

## 2019-07-25 MED ORDER — SUGAMMADEX SODIUM 200 MG/2ML IV SOLN
INTRAVENOUS | Status: DC | PRN
  Administered 2019-07-25: 200 mg via INTRAVENOUS

## 2019-07-25 MED ORDER — HYDROCODONE-ACETAMINOPHEN 7.5-325 MG PO TABS
1.0000 | ORAL_TABLET | ORAL | Status: DC | PRN
  Administered 2019-07-25: 2 via ORAL

## 2019-07-25 MED ORDER — BUPIVACAINE HCL 0.25 % IJ SOLN
INTRAMUSCULAR | Status: AC
  Filled 2019-07-25: qty 1

## 2019-07-25 MED ORDER — HYDROCODONE-ACETAMINOPHEN 7.5-325 MG PO TABS
ORAL_TABLET | ORAL | Status: AC
  Filled 2019-07-25: qty 2

## 2019-07-25 MED ORDER — VANCOMYCIN HCL IN DEXTROSE 1-5 GM/200ML-% IV SOLN: 1000.0000 mg | Freq: Two times a day (BID) | INTRAVENOUS | Status: DC

## 2019-07-25 MED ORDER — DOCUSATE SODIUM 100 MG PO CAPS: 100.0000 mg | ORAL_CAPSULE | Freq: Two times a day (BID) | ORAL | Status: DC

## 2019-07-25 MED ORDER — ONDANSETRON HCL 4 MG/2ML IJ SOLN
INTRAMUSCULAR | Status: AC
  Filled 2019-07-25: qty 6

## 2019-07-25 MED ORDER — TRANEXAMIC ACID-NACL 1000-0.7 MG/100ML-% IV SOLN
1000.0000 mg | INTRAVENOUS | Status: AC
  Administered 2019-07-25: 1000 mg via INTRAVENOUS
  Filled 2019-07-25: qty 100

## 2019-07-25 MED ORDER — ONDANSETRON HCL 4 MG/2ML IJ SOLN
INTRAMUSCULAR | Status: DC | PRN
  Administered 2019-07-25: 4 mg via INTRAVENOUS

## 2019-07-25 MED ORDER — KETOROLAC TROMETHAMINE 15 MG/ML IJ SOLN
7.5000 mg | Freq: Four times a day (QID) | INTRAMUSCULAR | Status: DC
  Administered 2019-07-25: 7.5 mg via INTRAVENOUS

## 2019-07-25 MED ORDER — MIDAZOLAM HCL 2 MG/2ML IJ SOLN
INTRAMUSCULAR | Status: AC
  Filled 2019-07-25: qty 2

## 2019-07-25 MED ORDER — MENTHOL 3 MG MT LOZG: 1.0000 | LOZENGE | OROMUCOSAL | Status: DC | PRN

## 2019-07-25 MED ORDER — KETOROLAC TROMETHAMINE 15 MG/ML IJ SOLN
INTRAMUSCULAR | Status: AC
  Filled 2019-07-25: qty 1

## 2019-07-25 MED ORDER — PROMETHAZINE HCL 25 MG/ML IJ SOLN: 6.2500 mg | INTRAMUSCULAR | Status: DC | PRN

## 2019-07-25 MED ORDER — BUPIVACAINE HCL (PF) 0.5 % IJ SOLN
INTRAMUSCULAR | Status: AC
  Filled 2019-07-25: qty 30

## 2019-07-25 MED ORDER — ACETAMINOPHEN 500 MG PO TABS
1000.0000 mg | ORAL_TABLET | Freq: Once | ORAL | Status: AC
  Filled 2019-07-25: qty 2

## 2019-07-25 MED ORDER — PANTOPRAZOLE SODIUM 40 MG PO TBEC: 80.0000 mg | DELAYED_RELEASE_TABLET | Freq: Every day | ORAL | Status: DC

## 2019-07-25 MED ORDER — LACTATED RINGERS IV SOLN: INTRAVENOUS | Status: DC

## 2019-07-25 MED ORDER — FENTANYL CITRATE (PF) 100 MCG/2ML IJ SOLN
INTRAMUSCULAR | Status: DC | PRN
  Administered 2019-07-25: 50 ug via INTRAVENOUS
  Administered 2019-07-25: 100 ug via INTRAVENOUS

## 2019-07-25 MED ORDER — PHENOL 1.4 % MT LIQD: 1.0000 | OROMUCOSAL | Status: DC | PRN

## 2019-07-25 MED ORDER — ACETAMINOPHEN 500 MG PO TABS: 500.0000 mg | ORAL_TABLET | Freq: Four times a day (QID) | ORAL | Status: DC

## 2019-07-25 MED ORDER — ACETAMINOPHEN 500 MG PO TABS
1000.0000 mg | ORAL_TABLET | Freq: Once | ORAL | Status: AC
  Administered 2019-07-25: 1000 mg via ORAL

## 2019-07-25 MED ORDER — POTASSIUM CHLORIDE CRYS ER 20 MEQ PO TBCR: 20.0000 meq | EXTENDED_RELEASE_TABLET | Freq: Every day | ORAL | Status: DC | PRN

## 2019-07-25 SURGICAL SUPPLY — 35 items
BAG SPEC THK2 15X12 ZIP CLS (MISCELLANEOUS) ×1
BAG ZIPLOCK 12X15 (MISCELLANEOUS) ×2 IMPLANT
BNDG COHESIVE 6X5 TAN STRL LF (GAUZE/BANDAGES/DRESSINGS) ×2 IMPLANT
COVER SURGICAL LIGHT HANDLE (MISCELLANEOUS) ×2 IMPLANT
COVER WAND RF STERILE (DRAPES) IMPLANT
DECANTER SPIKE VIAL GLASS SM (MISCELLANEOUS) ×2 IMPLANT
DRAPE STERI IOBAN 125X83 (DRAPES) ×2 IMPLANT
DRSG AQUACEL AG ADV 3.5X 4 (GAUZE/BANDAGES/DRESSINGS) ×3 IMPLANT
DURAPREP 26ML APPLICATOR (WOUND CARE) ×2 IMPLANT
ELECT REM PT RETURN 15FT ADLT (MISCELLANEOUS) ×2 IMPLANT
FACESHIELD WRAPAROUND (MASK) ×4 IMPLANT
FACESHIELD WRAPAROUND OR TEAM (MASK) ×2 IMPLANT
GLOVE BIOGEL PI IND STRL 8 (GLOVE) ×2 IMPLANT
GLOVE BIOGEL PI INDICATOR 8 (GLOVE) ×2
GLOVE ORTHO TXT STRL SZ7.5 (GLOVE) ×2 IMPLANT
GLOVE SURG SS PI 8.0 STRL IVOR (GLOVE) ×2 IMPLANT
GOWN STRL REUS W/TWL LRG LVL3 (GOWN DISPOSABLE) ×2 IMPLANT
KIT BASIN OR (CUSTOM PROCEDURE TRAY) ×2 IMPLANT
KIT TURNOVER KIT A (KITS) IMPLANT
NEEDLE HYPO 22GX1.5 SAFETY (NEEDLE) ×2 IMPLANT
NS IRRIG 1000ML POUR BTL (IV SOLUTION) ×2 IMPLANT
PACK GENERAL/GYN (CUSTOM PROCEDURE TRAY) ×2 IMPLANT
PENCIL SMOKE EVACUATOR (MISCELLANEOUS) IMPLANT
PIN GUIDE 3.2MM (PIN) ×2 IMPLANT
PROTECTOR NERVE ULNAR (MISCELLANEOUS) ×3 IMPLANT
SCREW CANNULATED 6.5X16X775 (Screw) ×3 IMPLANT
STRIP CLOSURE SKIN 1/2X4 (GAUZE/BANDAGES/DRESSINGS) ×2 IMPLANT
SUT VIC AB 0 CT1 27 (SUTURE) ×2
SUT VIC AB 0 CT1 27XBRD ANTBC (SUTURE) ×1 IMPLANT
SUT VIC AB 3-0 SH 8-18 (SUTURE) ×2 IMPLANT
SYR 20ML LL LF (SYRINGE) ×2 IMPLANT
TOWEL OR 17X26 10 PK STRL BLUE (TOWEL DISPOSABLE) ×2 IMPLANT
TOWEL OR NON WOVEN STRL DISP B (DISPOSABLE) ×2 IMPLANT
TRAY FOLEY MTR SLVR 16FR STAT (SET/KITS/TRAYS/PACK) IMPLANT
WATER STERILE IRR 1000ML POUR (IV SOLUTION) ×2 IMPLANT

## 2019-07-25 NOTE — Transfer of Care (Signed)
Immediate Anesthesia Transfer of Care Note  Patient: [name]  Procedure(s) Performed: Procedure(s): PERCUTANEOUS PINNING OF RIGHT FEMORAL PROXIMAL NECK (Right) HARDWARE REMOVAL RIGHT HIP (Right)  Patient Location: PACU  Anesthesia Type:General  Level of Consciousness:  sedated, patient cooperative and responds to stimulation  Airway & Oxygen Therapy:Patient Spontanous Breathing and Patient connected to face mask oxgen  Post-op Assessment:  Report given to PACU RN and Post -op Vital signs reviewed and stable  Post vital signs:  Reviewed and stable  Last Vitals:  Vitals:   07/25/19 0622  BP: (!) 131/103  Pulse: 84  Resp: 16  Temp: 37.2 C  SpO2: [id number]    Complications: No apparent anesthesia complications

## 2019-07-25 NOTE — Anesthesia Postprocedure Evaluation (Signed)
Anesthesia Post Note  Patient: [name]  Procedure(s) Performed: PERCUTANEOUS PINNING OF RIGHT FEMORAL PROXIMAL NECK (Right Hip) HARDWARE REMOVAL RIGHT HIP (Right Hip)     Patient location during evaluation: PACU Anesthesia Type: General Level of consciousness: awake and alert and oriented Pain management: pain level controlled Vital Signs Assessment: post-procedure vital signs reviewed and stable Respiratory status: spontaneous breathing, nonlabored ventilation and respiratory function stable Cardiovascular status: blood pressure returned to baseline Postop Assessment: no apparent nausea or vomiting Anesthetic complications: no    Last Vitals:  Vitals:   07/25/19 0945 07/25/19 1000  BP: 126/88 116/90  Pulse: 76 76  Resp: 12 13  Temp: 36.5 C 36.5 C  SpO2: 98% 98%     LLE Motor Response: Purposeful movement (07/25/19 1000)     RLE Sensation: Full sensation (07/25/19 1000)      Brennan Bailey

## 2019-07-25 NOTE — Anesthesia Procedure Notes (Signed)
Procedure Name: Intubation Date/Time: 07/25/2019 7:43 AM Performed by: Lavina Hamman, CRNA Pre-anesthesia Checklist: Patient identified, Emergency Drugs available, Suction available, Patient being monitored and Timeout performed Patient Re-evaluated:Patient Re-evaluated prior to induction Oxygen Delivery Method: Circle system utilized Preoxygenation: Pre-oxygenation with 100% oxygen Induction Type: IV induction Ventilation: Mask ventilation without difficulty Laryngoscope Size: Mac and 3 Grade View: Grade I Tube type: Oral Tube size: 7.0 mm Number of attempts: 1 Airway Equipment and Method: Stylet Placement Confirmation: ETT inserted through vocal cords under direct vision,  positive ETCO2,  CO2 detector and breath sounds checked- equal and bilateral Secured at: 20 cm Tube secured with: Tape Dental Injury: Teeth and Oropharynx as per pre-operative assessment
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
## 2019-07-25 NOTE — Discharge Instructions (Signed)
Diet: As you were doing prior to hospitalization   Shower:  May shower but keep the wounds dry, use an occlusive plastic wrap, NO SOAKING IN TUB.  If the bandage gets wet, change with a clean dry gauze.  If you have a splint on, leave the splint in place and keep the splint dry with a plastic bag.  Dressing:  You may change your dressing 3-5 days after surgery, unless you have a splint.  If you have a splint, then just leave the splint in place and we will change your bandages during your first follow-up appointment.    If you had hand or foot surgery, we will plan to remove your stitches in about 2 weeks in the office.  For all other surgeries, there are sticky tapes (steri-strips) on your wounds and all the stitches are absorbable.  Leave the steri-strips in place when changing your dressings, they will peel off with time, usually 2-3 weeks.  Activity:  Increase activity slowly as tolerated, but follow the weight bearing instructions below.  The rules on driving is that you can not be taking narcotics while you drive, and you must feel in control of the vehicle.    Weight Bearing:   As tolerated.    To prevent constipation: you may use a stool softener such as -  Colace (over the counter) 100 mg by mouth twice a day  Drink plenty of fluids (prune juice may be helpful) and high fiber foods Miralax (over the counter) for constipation as needed.    Itching:  If you experience itching with your medications, try taking only a single pain pill, or even half a pain pill at a time.  You may take up to 10 pain pills per day, and you can also use benadryl over the counter for itching or also to help with sleep.   Precautions:  If you experience chest pain or shortness of breath - call 911 immediately for transfer to the hospital emergency department!!  If you develop a fever greater that 101 F, purulent drainage from wound, increased redness or drainage from wound, or calf pain -- Call the office at  [phone]                                                Follow- Up Appointment:  Please call for an appointment to be seen in 2 weeks [place] - [phone]

## 2019-07-25 NOTE — Op Note (Signed)
07/25/2019  8:31 AM  PATIENT:  [name]    PRE-OPERATIVE DIAGNOSIS: Right hip pain with history of percutaneous pinning of femoral neck stress fracture  POST-OPERATIVE DIAGNOSIS:  Same  PROCEDURE: Right hip removal of hardware with exchange of percutaneous pins for shorter screws  SURGEON:  Johnny Bridge, MD  PHYSICIAN ASSISTANT: Merlene Pulling, PA-C, present and scrubbed throughout the case, critical for completion in a timely fashion, and for retraction, instrumentation, and closure.  ANESTHESIA:   General  PREOPERATIVE INDICATIONS:  [name] is a  38 y.o. female who had a right hip femoral neck stress fracture, and underwent percutaneous pinning.  Postoperatively she never really achieved good pain control.  CT scan demonstrated healing of the fracture site, but she was still having persistent pain.  There was also one screw that was slightly prominent within the fovea of the femoral head, and she elected for exchange of the screws in hopes of possibly relieving pain, if the prominent hardware was the source of pain, and alternatively with the potential of fresh application of compression at the fracture site with a new set of screws that she may achieve better union, although it is not clear that she has developed a nonunion.  The risks benefits and alternatives were discussed with the patient preoperatively including but not limited to the risks of infection, bleeding, nerve injury, cardiopulmonary complications, the need for revision surgery, among others, and the patient was willing to proceed.  We also discussed the risks of incomplete relief of pain, the potential for failure of hardware and the need for conversion to total hip replacement, among others.  ESTIMATED BLOOD LOSS: Minimal  OPERATIVE IMPLANTS: Cannulated screws x3 from [place], removing 1 cm off of the inferior screw and 0.5 cm off of the superior 2 screws  OPERATIVE FINDINGS: The inferior screw was  prominent, and introduction of a guidewire did pass all the way through the femoral head.  OPERATIVE PROCEDURE: The patient was brought to the operating room and placed in the supine position.  General anesthesia was administered.  The right lower extremity was prepped and draped in usual sterile fashion.  Timeout performed.  She was on a fracture table all bony prominences padded.  Incision was made through her previous incision, and I introduced guidewires through the previous screws, and I remove the inferior screw first, exchanged it for a 75 mm length, and had excellent compression.  I also exchange the superiormost screws, just to be sure that there was nothing threatening prominence.  I irrigated the wounds copiously, repaired the subcutaneous tissue with Vicryl, Steri-Strips and sterile gauze for the skin.  She was awakened and returned to the PACU in stable and satisfactory condition.  There were no complications and she tolerated the procedure well.

## 2019-07-25 NOTE — Progress Notes (Signed)
Patient wanting to discharge, order placed for PT. Explained patient would need to see PT prior to discharge. Paged PT X 2 with no response. Also called main PT office with no answer.

## 2019-07-25 NOTE — Progress Notes (Signed)
Patient called RN to room. Patient stated she cannot wait for PT any longer and she is leaving. RN attempted to explain importance of PT but patient stated she was leaving anyway and had already called her ride to come get her. Discharge instructions explained and patient verbalized understanding.

## 2019-07-31 ENCOUNTER — Encounter: Payer: Self-pay | Admitting: *Deleted

## 2019-08-06 ENCOUNTER — Encounter: Payer: Medicaid Other | Admitting: Physical Medicine and Rehabilitation

## 2019-08-06 MED ORDER — ONDANSETRON 8 MG DISINTEGRATING TABLET
ORAL_TABLET | Freq: Three times a day (TID) | ORAL | 0 refills | 20.00000 days | PRN
Start: 2019-08-06 — End: ?

## 2019-08-06 MED FILL — CYANOCOBALAMIN (VIT B-12) 1,000 MCG/ML INJECTION SOLUTION: 90 days supply | Qty: 3 | Fill #2 | Status: AC

## 2019-08-06 MED FILL — FAMOTIDINE 40 MG TABLET: ORAL | 30 days supply | Qty: 30 | Fill #4

## 2019-08-06 MED FILL — ESOMEPRAZOLE MAGNESIUM 40 MG CAPSULE,DELAYED RELEASE: 30 days supply | Qty: 60 | Fill #2 | Status: AC

## 2019-08-06 MED FILL — FAMOTIDINE 40 MG TABLET: 30 days supply | Qty: 30 | Fill #4 | Status: AC

## 2019-08-06 MED FILL — ESOMEPRAZOLE MAGNESIUM 40 MG CAPSULE,DELAYED RELEASE: ORAL | 30 days supply | Qty: 60 | Fill #2

## 2019-08-07 MED ORDER — CYANOCOBALAMIN (VIT B-12) 1,000 MCG/ML INJECTION SOLUTION
SUBCUTANEOUS | 5 refills | 120.00000 days | Status: CP
Start: 2019-08-07 — End: 2020-08-06
  Filled 2019-08-06: qty 3, 90d supply, fill #2

## 2019-08-07 MED ORDER — BD SAFETY-LOK DETACHABLE NEEDLE 3 ML 25 GAUGE X 5/8" SYRINGE
INJECTION | 0 refills | 0 days | Status: CP
Start: 2019-08-07 — End: ?

## 2019-08-07 MED ORDER — ERGOCALCIFEROL (VITAMIN D2) 1,250 MCG (50,000 UNIT) CAPSULE
ORAL_CAPSULE | ORAL | 0 refills | 84 days | Status: CP
Start: 2019-08-07 — End: 2020-08-06

## 2019-08-14 ENCOUNTER — Telehealth: Admit: 2019-08-14 | Discharge: 2019-08-15 | Payer: MEDICAID | Attending: Family | Primary: Family

## 2019-08-14 DIAGNOSIS — F411 Generalized anxiety disorder: Principal | ICD-10-CM

## 2019-08-14 DIAGNOSIS — F329 Major depressive disorder, single episode, unspecified: Principal | ICD-10-CM

## 2019-08-14 MED ORDER — ALPRAZOLAM 1 MG TABLET: 1 mg | each | Freq: Every day | 0 refills | 31 days | Status: AC

## 2019-08-14 MED ORDER — TRAZODONE 50 MG TABLET: 100 mg | tablet | Freq: Every evening | 0 refills | 60 days | Status: AC

## 2019-08-14 MED ORDER — ALPRAZOLAM 1 MG TABLET
Freq: Every day | ORAL | 0 refills | 31.00000 days | Status: CP | PRN
Start: 2019-08-14 — End: 2019-08-14

## 2019-08-14 MED ORDER — TRAZODONE 50 MG TABLET
ORAL_TABLET | Freq: Every evening | ORAL | 0 refills | 60.00000 days | Status: CP
Start: 2019-08-14 — End: 2019-08-14
  Filled 2019-09-03: qty 120, 60d supply, fill #0

## 2019-08-15 ENCOUNTER — Encounter
Payer: Medicaid Other | Attending: Physical Medicine and Rehabilitation | Admitting: Physical Medicine and Rehabilitation

## 2019-08-15 DIAGNOSIS — F411 Generalized anxiety disorder: Principal | ICD-10-CM

## 2019-08-15 DIAGNOSIS — Z96641 Presence of right artificial hip joint: Secondary | ICD-10-CM | POA: Insufficient documentation

## 2019-08-15 DIAGNOSIS — M797 Fibromyalgia: Secondary | ICD-10-CM | POA: Insufficient documentation

## 2019-08-15 DIAGNOSIS — M7918 Myalgia, other site: Secondary | ICD-10-CM | POA: Insufficient documentation

## 2019-08-15 DIAGNOSIS — M5010 Cervical disc disorder with radiculopathy, unspecified cervical region: Secondary | ICD-10-CM | POA: Insufficient documentation

## 2019-08-15 DIAGNOSIS — M21611 Bunion of right foot: Secondary | ICD-10-CM | POA: Insufficient documentation

## 2019-08-15 DIAGNOSIS — M21612 Bunion of left foot: Secondary | ICD-10-CM | POA: Insufficient documentation

## 2019-08-15 MED ORDER — ALPRAZOLAM 1 MG TABLET
Freq: Every day | ORAL | 0 refills | 31 days | Status: CP | PRN
Start: 2019-08-15 — End: ?

## 2019-08-15 MED ORDER — AZATHIOPRINE 50 MG TABLET
ORAL_TABLET | 0 refills | 0 days | Status: CP
Start: 2019-08-15 — End: ?

## 2019-08-21 ENCOUNTER — Ambulatory Visit: Payer: Medicaid Other | Admitting: Neurology

## 2019-08-27 ENCOUNTER — Telehealth: Payer: Self-pay | Admitting: Neurology

## 2019-08-27 ENCOUNTER — Institutional Professional Consult (permissible substitution): Admit: 2019-08-27 | Discharge: 2019-08-28 | Payer: MEDICAID | Attending: Internal Medicine | Primary: Internal Medicine

## 2019-08-27 NOTE — Telephone Encounter (Signed)
Pt called stating that the Milnacipran (SAVELLA) 50 MG TABS tablet is not working for her and she is wanting to get off of it. Please advise.

## 2019-08-27 NOTE — Telephone Encounter (Signed)
Patient sent a message through the after hours call service regarding tapering her medication, I reiterated her previous instructions to taper her Savella, I talked actually to patient's mother Erline Levine, she gave verbal feedback and demonstrated understanding and agreement with pt tapering Savella as instructed over the next 2 weeks.

## 2019-08-27 NOTE — Telephone Encounter (Signed)
She may taper Savella over 2 weeks, up to date suggests taper over 1-2 weeks. She is taking 50 mg , 1 tablet twice daily. Have her take 1/2 tablet twice daily x 1 week, then take 1/2 tablet daily x 1 week then stop.

## 2019-08-27 NOTE — Telephone Encounter (Signed)
I called pt and she is taking the savella 80m po bid and is not really helping her. She states has been on for 6 months.  She is asking if she can stop cold [place] if this needs to be tapered off.  Her psych will be placing her on effexor and wellbutrin at some point.  Please advise.

## 2019-08-28 NOTE — Telephone Encounter (Signed)
See note from Dr. Rexene Alberts.

## 2019-08-29 ENCOUNTER — Ambulatory Visit: Payer: Medicaid Other | Admitting: Neurology

## 2019-08-29 MED ORDER — ONDANSETRON 8 MG DISINTEGRATING TABLET
ORAL_TABLET | Freq: Three times a day (TID) | ORAL | 0 refills | 20.00000 days | PRN
Start: 2019-08-29 — End: ?

## 2019-08-30 ENCOUNTER — Encounter: Payer: Self-pay | Admitting: Physical Medicine and Rehabilitation

## 2019-08-30 ENCOUNTER — Other Ambulatory Visit: Payer: Self-pay

## 2019-08-30 ENCOUNTER — Encounter (HOSPITAL_BASED_OUTPATIENT_CLINIC_OR_DEPARTMENT_OTHER): Payer: Medicaid Other | Admitting: Physical Medicine and Rehabilitation

## 2019-08-30 VITALS — BP 140/89 | HR 108 | Temp 97.7°F | Ht 63.0 in | Wt 155.0 lb

## 2019-08-30 DIAGNOSIS — M797 Fibromyalgia: Secondary | ICD-10-CM | POA: Diagnosis not present

## 2019-08-30 DIAGNOSIS — Z96641 Presence of right artificial hip joint: Secondary | ICD-10-CM | POA: Diagnosis present

## 2019-08-30 DIAGNOSIS — M7918 Myalgia, other site: Secondary | ICD-10-CM

## 2019-08-30 DIAGNOSIS — M5010 Cervical disc disorder with radiculopathy, unspecified cervical region: Secondary | ICD-10-CM

## 2019-08-30 DIAGNOSIS — M21611 Bunion of right foot: Secondary | ICD-10-CM

## 2019-08-30 DIAGNOSIS — M21612 Bunion of left foot: Secondary | ICD-10-CM

## 2019-08-30 MED ORDER — VENLAFAXINE 37.5 MG TABLET
ORAL_TABLET | Freq: Two times a day (BID) | ORAL | 0 refills | 30 days | Status: CP
Start: 2019-08-30 — End: 2019-09-29
  Filled 2019-09-03: qty 60, 30d supply, fill #0

## 2019-08-30 MED ORDER — AZATHIOPRINE 50 MG TABLET
ORAL_TABLET | Freq: Every day | ORAL | 0 refills | 90 days | Status: CP
Start: 2019-08-30 — End: 2020-08-29

## 2019-08-30 MED ORDER — AMITRIPTYLINE HCL 75 MG PO TABS: 75.0000 mg | ORAL_TABLET | Freq: Every day | ORAL | 1 refills | Status: DC
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
## 2019-09-02 ENCOUNTER — Ambulatory Visit: Admit: 2019-09-02 | Discharge: 2019-09-03 | Payer: MEDICAID

## 2019-09-03 MED FILL — VENLAFAXINE 37.5 MG TABLET: 30 days supply | Qty: 60 | Fill #0 | Status: AC

## 2019-09-03 MED FILL — TRAZODONE 50 MG TABLET: 60 days supply | Qty: 120 | Fill #0 | Status: AC

## 2019-09-04 MED ORDER — ELAGOLIX 150 MG TABLET
ORAL_TABLET | Freq: Every day | ORAL | 3 refills | 0.00000 days | Status: CP
Start: 2019-09-04 — End: ?
  Filled 2019-09-05: qty 28, 28d supply, fill #0

## 2019-09-04 MED ORDER — VENLAFAXINE 37.5 MG TABLET
ORAL_TABLET | Freq: Two times a day (BID) | ORAL | 0 refills | 30.00000 days | Status: CP
Start: 2019-09-04 — End: 2019-10-04

## 2019-09-05 ENCOUNTER — Ambulatory Visit (INDEPENDENT_AMBULATORY_CARE_PROVIDER_SITE_OTHER): Payer: Medicaid Other

## 2019-09-05 ENCOUNTER — Other Ambulatory Visit: Payer: Self-pay

## 2019-09-05 ENCOUNTER — Ambulatory Visit: Payer: Medicaid Other | Admitting: Podiatry

## 2019-09-05 VITALS — BP 123/91 | HR 98 | Temp 97.2°F

## 2019-09-05 DIAGNOSIS — M21619 Bunion of unspecified foot: Secondary | ICD-10-CM

## 2019-09-05 DIAGNOSIS — M216X2 Other acquired deformities of left foot: Secondary | ICD-10-CM | POA: Diagnosis not present

## 2019-09-05 DIAGNOSIS — M2011 Hallux valgus (acquired), right foot: Secondary | ICD-10-CM | POA: Diagnosis not present

## 2019-09-05 DIAGNOSIS — M216X1 Other acquired deformities of right foot: Secondary | ICD-10-CM | POA: Diagnosis not present

## 2019-09-05 DIAGNOSIS — M722 Plantar fascial fibromatosis: Secondary | ICD-10-CM

## 2019-09-05 DIAGNOSIS — M2012 Hallux valgus (acquired), left foot: Secondary | ICD-10-CM

## 2019-09-05 MED FILL — ORILISSA 150 MG TABLET: 28 days supply | Qty: 28 | Fill #0 | Status: AC

## 2019-09-09 MED FILL — FAMOTIDINE 40 MG TABLET: ORAL | 30 days supply | Qty: 30 | Fill #5

## 2019-09-09 MED FILL — FAMOTIDINE 40 MG TABLET: 30 days supply | Qty: 30 | Fill #5 | Status: AC

## 2019-09-11 DIAGNOSIS — F411 Generalized anxiety disorder: Principal | ICD-10-CM

## 2019-09-11 MED ORDER — ALPRAZOLAM 1 MG TABLET
Freq: Every day | ORAL | 0 refills | 31 days | Status: CP | PRN
Start: 2019-09-11 — End: ?

## 2019-09-12 ENCOUNTER — Encounter: Payer: Self-pay | Admitting: Neurology

## 2019-09-12 ENCOUNTER — Other Ambulatory Visit: Payer: Self-pay

## 2019-09-12 ENCOUNTER — Ambulatory Visit: Payer: Medicaid Other | Admitting: Neurology

## 2019-09-12 VITALS — BP 131/84 | HR 68 | Temp 97.4°F | Ht 63.0 in | Wt 159.0 lb

## 2019-09-12 DIAGNOSIS — M797 Fibromyalgia: Secondary | ICD-10-CM

## 2019-09-12 DIAGNOSIS — G9349 Other encephalopathy: Secondary | ICD-10-CM

## 2019-09-12 MED ORDER — MODAFINIL 200 MG PO TABS: 200.0000 mg | ORAL_TABLET | Freq: Every day | ORAL | 3 refills | Status: DC
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
## 2019-09-13 ENCOUNTER — Encounter: Payer: Medicaid Other | Admitting: Physical Medicine and Rehabilitation

## 2019-09-15 DIAGNOSIS — F411 Generalized anxiety disorder: Principal | ICD-10-CM

## 2019-09-16 ENCOUNTER — Telehealth: Payer: Self-pay

## 2019-09-16 NOTE — Telephone Encounter (Signed)
PA [id number]  For [place] tracks via phone at  [phone] for generic Modafinil for chronic fatigue syndrome>Respond within 24 hours.

## 2019-09-18 ENCOUNTER — Encounter
Payer: Medicaid Other | Attending: Physical Medicine and Rehabilitation | Admitting: Physical Medicine and Rehabilitation

## 2019-09-18 DIAGNOSIS — F411 Generalized anxiety disorder: Principal | ICD-10-CM

## 2019-09-18 DIAGNOSIS — M21611 Bunion of right foot: Secondary | ICD-10-CM | POA: Insufficient documentation

## 2019-09-18 DIAGNOSIS — M21612 Bunion of left foot: Secondary | ICD-10-CM | POA: Insufficient documentation

## 2019-09-18 DIAGNOSIS — M7918 Myalgia, other site: Secondary | ICD-10-CM | POA: Insufficient documentation

## 2019-09-18 DIAGNOSIS — Z96641 Presence of right artificial hip joint: Secondary | ICD-10-CM | POA: Insufficient documentation

## 2019-09-18 DIAGNOSIS — M5010 Cervical disc disorder with radiculopathy, unspecified cervical region: Secondary | ICD-10-CM | POA: Insufficient documentation

## 2019-09-18 DIAGNOSIS — M797 Fibromyalgia: Secondary | ICD-10-CM | POA: Insufficient documentation

## 2019-09-18 MED FILL — ESOMEPRAZOLE MAGNESIUM 40 MG CAPSULE,DELAYED RELEASE: ORAL | 30 days supply | Qty: 60 | Fill #3

## 2019-09-18 MED FILL — ESOMEPRAZOLE MAGNESIUM 40 MG CAPSULE,DELAYED RELEASE: 30 days supply | Qty: 60 | Fill #3 | Status: AC

## 2019-09-18 NOTE — Telephone Encounter (Signed)
I called [place] tracks and they stated medication was denied.IF change to brand name Provigil it can be approve with clinical questions. I gave diagnose of chronic fatigue. PA was approve from 09/18/2019 to 09/12/2020. PA number is [id number].

## 2019-09-19 MED ORDER — DIPHENOXYLATE-ATROPINE 2.5 MG-0.025 MG TABLET
ORAL_TABLET | 1 refills | 0 days | Status: CP
Start: 2019-09-19 — End: ?
  Filled 2019-09-24: qty 180, 30d supply, fill #0

## 2019-09-19 MED ORDER — ONDANSETRON 8 MG DISINTEGRATING TABLET
ORAL_TABLET | Freq: Three times a day (TID) | ORAL | 0 refills | 20.00000 days | PRN
Start: 2019-09-19 — End: ?

## 2019-09-23 ENCOUNTER — Other Ambulatory Visit: Payer: Self-pay | Admitting: Physical Medicine and Rehabilitation

## 2019-09-24 MED ORDER — ONDANSETRON 8 MG DISINTEGRATING TABLET
ORAL_TABLET | Freq: Three times a day (TID) | ORAL | 0 refills | 20 days | Status: CP | PRN
Start: 2019-09-24 — End: ?
  Filled 2019-10-02: qty 60, 20d supply, fill #0

## 2019-09-24 MED FILL — DIPHENOXYLATE-ATROPINE 2.5 MG-0.025 MG TABLET: 30 days supply | Qty: 180 | Fill #0 | Status: AC

## 2019-09-25 ENCOUNTER — Other Ambulatory Visit: Payer: Self-pay | Admitting: Neurology

## 2019-09-26 ENCOUNTER — Telehealth: Admit: 2019-09-26 | Discharge: 2019-09-27 | Payer: MEDICAID | Attending: Family | Primary: Family

## 2019-09-27 DIAGNOSIS — F411 Generalized anxiety disorder: Principal | ICD-10-CM

## 2019-09-27 MED ORDER — VENLAFAXINE 37.5 MG TABLET
ORAL_TABLET | 0 refills | 0 days | Status: CP
Start: 2019-09-27 — End: ?

## 2019-09-27 MED ORDER — ALPRAZOLAM 1 MG TABLET: 1 mg | tablet | Freq: Two times a day (BID) | 1 refills | 30 days | Status: AC

## 2019-09-27 MED ORDER — ALPRAZOLAM 1 MG TABLET
ORAL_TABLET | Freq: Two times a day (BID) | ORAL | 1 refills | 30.00000 days | Status: CP | PRN
Start: 2019-09-27 — End: 2019-09-27

## 2019-09-27 MED ORDER — TRAZODONE 150 MG TABLET
ORAL_TABLET | Freq: Every evening | ORAL | 0 refills | 90 days | Status: CP
Start: 2019-09-27 — End: 2019-12-26
  Filled 2019-10-02: qty 90, 90d supply, fill #0

## 2019-09-30 MED ORDER — VENLAFAXINE ER 37.5 MG CAPSULE,EXTENDED RELEASE 24 HR
ORAL_CAPSULE | Freq: Every day | ORAL | 0 refills | 42 days | Status: CP
Start: 2019-09-30 — End: 2020-09-29
  Filled 2019-10-02: qty 42, 42d supply, fill #0

## 2019-10-01 ENCOUNTER — Ambulatory Visit: Payer: Medicaid Other

## 2019-10-01 DIAGNOSIS — F411 Generalized anxiety disorder: Principal | ICD-10-CM

## 2019-10-01 MED ORDER — ALPRAZOLAM 1 MG TABLET
ORAL_TABLET | Freq: Two times a day (BID) | ORAL | 1 refills | 30 days | Status: CP | PRN
Start: 2019-10-01 — End: ?

## 2019-10-02 DIAGNOSIS — F411 Generalized anxiety disorder: Principal | ICD-10-CM

## 2019-10-02 MED ORDER — ALPRAZOLAM 1 MG TABLET
ORAL_TABLET | Freq: Two times a day (BID) | ORAL | 1 refills | 30 days | Status: CP | PRN
Start: 2019-10-02 — End: ?

## 2019-10-02 MED FILL — ORILISSA 150 MG TABLET: ORAL | 28 days supply | Qty: 28 | Fill #1

## 2019-10-02 MED FILL — TRAZODONE 150 MG TABLET: 90 days supply | Qty: 90 | Fill #0 | Status: AC

## 2019-10-02 MED FILL — ONDANSETRON 8 MG DISINTEGRATING TABLET: 20 days supply | Qty: 60 | Fill #0 | Status: AC

## 2019-10-02 MED FILL — VENLAFAXINE ER 37.5 MG CAPSULE,EXTENDED RELEASE 24 HR: 42 days supply | Qty: 42 | Fill #0 | Status: AC

## 2019-10-02 MED FILL — ORILISSA 150 MG TABLET: 28 days supply | Qty: 28 | Fill #1 | Status: AC

## 2019-10-07 ENCOUNTER — Telehealth: Payer: Self-pay | Admitting: Neurology

## 2019-10-07 MED ORDER — VENLAFAXINE ER 150 MG CAPSULE,EXTENDED RELEASE 24 HR
ORAL_CAPSULE | Freq: Every day | ORAL | 3 refills | 90.00000 days | Status: CP
Start: 2019-10-07 — End: 2020-10-06

## 2019-10-07 NOTE — Telephone Encounter (Signed)
Pt called stating that her modafinil (PROVIGIL) 200 MG tablet has to be refilled and called in to the pharmacy with the change of 1 time a day to BID Also pt states that she was told to increase her pramipexole (MIRAPEX) 0.75 MG tablet but was not told how much to increase it to. Please advise.

## 2019-10-08 MED ORDER — MODAFINIL 200 MG PO TABS: 200.0000 mg | ORAL_TABLET | Freq: Two times a day (BID) | ORAL | 1 refills | Status: DC

## 2019-10-08 MED ORDER — PRAMIPEXOLE DIHYDROCHLORIDE 1 MG PO TABS: 1.0000 mg | ORAL_TABLET | Freq: Every day | ORAL | 1 refills | Status: DC

## 2019-10-08 NOTE — Telephone Encounter (Signed)
I called the patient.  The patient started the Provigil at 200 mg twice daily, she was supposed to start at 200 mg daily, however she is tolerating this dose and believes that it is helpful, I will send a prescription for the twice a day dosing.  She is not getting full benefit from the 0.75 mg dosing of the Mirapex, will go to 1.0 mg in the evening.

## 2019-10-08 NOTE — Addendum Note (Signed)
Addended by: Kathrynn Ducking on: 10/08/2019 04:35 PM   Modules accepted: Orders

## 2019-10-08 NOTE — Telephone Encounter (Signed)
I called the patient, left message, I will call back later.

## 2019-10-08 NOTE — Telephone Encounter (Signed)
Dr. Jannifer Franklin can you review? I reviewed most recent o/v and could not locate where modafinil has been increased to bid or increasing the mirapex 0.75?  Thanks!

## 2019-10-09 ENCOUNTER — Other Ambulatory Visit: Payer: Self-pay

## 2019-10-09 ENCOUNTER — Ambulatory Visit: Payer: Medicaid Other | Attending: Neurology

## 2019-10-09 DIAGNOSIS — R41841 Cognitive communication deficit: Secondary | ICD-10-CM | POA: Diagnosis present

## 2019-10-09 NOTE — Patient Instructions (Signed)
  We will see you for therapy focusing on your attention and your memory of things you have just heard. Once we finish the testing there may be other things to focus on.  I think twice a week for 4 weeks and then once a week for 4 weeks will be a good schedule for you. We can adjust this if we need to.
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
## 2019-10-11 MED ORDER — VENLAFAXINE ER 37.5 MG CAPSULE,EXTENDED RELEASE 24 HR
ORAL_CAPSULE | Freq: Every day | ORAL | 0 refills | 14 days | Status: CP
Start: 2019-10-11 — End: 2020-10-10

## 2019-10-13 NOTE — Progress Notes (Signed)
  Subjective:  Patient ID: [name], female    DOB: 1980/08/13,  MRN: [id number]  Chief Complaint  Patient presents with  . Bunions    Bilateral, medial. Pt stated, "They were diagnosed over 20 yrs ago. I said that I would put up with them until I couldn't walk anymore. Constant 8/10 pain. Only rest helps - Tylenol and Ibuprofen don't help".  . Plantar Fasciitis    Bilateral heel, bottom of heel. Pt stated, "I wonder if the pain is coming from my lower back. Another doctor told me that I have plantar fasciitis".    39 y.o. female presents with the above complaint. History confirmed with patient. Smokes 1 half pack cigarettes per day.  Objective:  Physical Exam: warm, good capillary refill, no trophic changes or ulcerative lesions, normal DP and PT pulses and normal sensory exam. Left Foot: bunion deformity noted  Right Foot: bunion deformity noted   No images are attached to the encounter.  Radiographs: X-ray of both feet: hallux deformity noted no acute fractures or dislocations   Assessment:   1. Bunion   2. Acquired hallux valgus of both feet   3. Plantar fasciitis      Plan:  Patient was evaluated and treated and all questions answered.  Bunion -Discussed with patient that she would benefit from elective correction however we should hold off while she continues to smoke.  Encourage smoking cessation prior to surgery.  Advised her to talk to her primary care doctor for resources to assist in quitting follow-up in 3 months for further discussion after complete cessation  No follow-ups on file.   MDM

## 2019-10-15 ENCOUNTER — Ambulatory Visit: Payer: Medicaid Other | Admitting: Speech Pathology

## 2019-10-17 ENCOUNTER — Ambulatory Visit: Payer: Medicaid Other

## 2019-10-18 MED ORDER — AZATHIOPRINE 50 MG TABLET
ORAL_TABLET | Freq: Every day | ORAL | 0 refills | 90 days | Status: CP
Start: 2019-10-18 — End: 2020-10-17
  Filled 2019-10-21: qty 90, 30d supply, fill #0

## 2019-10-18 MED ORDER — CYANOCOBALAMIN (VIT B-12) 1,000 MCG/ML INJECTION SOLUTION
SUBCUTANEOUS | 5 refills | 120 days | Status: CP
Start: 2019-10-18 — End: 2020-10-17

## 2019-10-18 MED ORDER — VENLAFAXINE ER 37.5 MG CAPSULE,EXTENDED RELEASE 24 HR
ORAL_CAPSULE | Freq: Every day | ORAL | 0 refills | 30 days | Status: CP
Start: 2019-10-18 — End: 2020-01-16

## 2019-10-21 MED FILL — ESOMEPRAZOLE MAGNESIUM 40 MG CAPSULE,DELAYED RELEASE: ORAL | 30 days supply | Qty: 60 | Fill #4

## 2019-10-21 MED FILL — FAMOTIDINE 40 MG TABLET: 30 days supply | Qty: 30 | Fill #6 | Status: AC

## 2019-10-21 MED FILL — AZATHIOPRINE 50 MG TABLET: 30 days supply | Qty: 90 | Fill #0 | Status: AC

## 2019-10-21 MED FILL — FAMOTIDINE 40 MG TABLET: ORAL | 30 days supply | Qty: 30 | Fill #6

## 2019-10-21 MED FILL — ESOMEPRAZOLE MAGNESIUM 40 MG CAPSULE,DELAYED RELEASE: 30 days supply | Qty: 60 | Fill #4 | Status: AC

## 2019-10-22 ENCOUNTER — Telehealth: Payer: Self-pay | Admitting: Neurology

## 2019-10-22 ENCOUNTER — Ambulatory Visit: Payer: Medicaid Other

## 2019-10-22 DIAGNOSIS — R202 Paresthesia of skin: Secondary | ICD-10-CM

## 2019-10-22 MED ORDER — CARBAMAZEPINE 200 MG PO TABS: ORAL_TABLET | ORAL | 3 refills | Status: DC

## 2019-10-22 NOTE — Addendum Note (Signed)
Addended by: Kathrynn Ducking on: 10/22/2019 05:02 PM   Modules accepted: Orders

## 2019-10-22 NOTE — Telephone Encounter (Signed)
Pt has called to report she is currently experiencing : feet burning, legs hurt, numbness in fingers and toes & no feeling in finger tips.  Pt is asking if she can be seen as soon as possible. Pt stated she almost went to the hospital due to pain. Pt is aware of her July appointment with Dr Jannifer Franklin. Pt has declined an appointment with Judson Roch, NP.  Pt then stated she will accept a call back from RN of Dr Jannifer Franklin.

## 2019-10-22 NOTE — Telephone Encounter (Signed)
I called the patient.  The patient is having a significant increase in tingling in the feet and toes and hands, worsening pain, she will be placed on carbamazepine for discomfort and the set up for EMG nerve conduction study, we will do nerve conductions on both legs and 1 arm, EMG on 1 leg.

## 2019-10-24 ENCOUNTER — Ambulatory Visit: Payer: Medicaid Other | Admitting: Speech Pathology

## 2019-10-24 ENCOUNTER — Telehealth: Payer: Self-pay

## 2019-10-24 NOTE — Telephone Encounter (Signed)
I receive fax from pharmacy that pt paid cash for tegretol but it will still need PA. PA was done on [place] [facility] portal.Decision pending.

## 2019-10-28 ENCOUNTER — Ambulatory Visit: Payer: Medicaid Other

## 2019-10-28 MED FILL — CYANOCOBALAMIN (VIT B-12) 1,000 MCG/ML INJECTION SOLUTION: 30 days supply | Qty: 1 | Fill #0 | Status: AC

## 2019-10-28 MED FILL — CYANOCOBALAMIN (VIT B-12) 1,000 MCG/ML INJECTION SOLUTION: SUBCUTANEOUS | 30 days supply | Qty: 1 | Fill #0

## 2019-10-29 ENCOUNTER — Telehealth: Admit: 2019-10-29 | Discharge: 2019-10-30 | Payer: MEDICAID | Attending: Family | Primary: Family

## 2019-10-29 DIAGNOSIS — F329 Major depressive disorder, single episode, unspecified: Principal | ICD-10-CM

## 2019-10-29 DIAGNOSIS — F900 Attention-deficit hyperactivity disorder, predominantly inattentive type: Principal | ICD-10-CM

## 2019-10-29 DIAGNOSIS — Z72820 Sleep deprivation: Principal | ICD-10-CM

## 2019-10-29 DIAGNOSIS — F411 Generalized anxiety disorder: Principal | ICD-10-CM

## 2019-10-29 DIAGNOSIS — G8929 Other chronic pain: Principal | ICD-10-CM

## 2019-10-29 MED ORDER — ALPRAZOLAM 1 MG TABLET
ORAL_TABLET | Freq: Two times a day (BID) | ORAL | 1 refills | 30.00000 days | Status: CP | PRN
Start: 2019-10-29 — End: ?

## 2019-10-29 MED ORDER — DEXTROAMPHETAMINE-AMPHETAMINE 10 MG TABLET
ORAL_TABLET | Freq: Two times a day (BID) | ORAL | 0 refills | 30.00000 days | Status: CP
Start: 2019-10-29 — End: ?

## 2019-10-29 MED ORDER — QUETIAPINE 25 MG TABLET
ORAL_TABLET | Freq: Every evening | ORAL | 1 refills | 30.00000 days | Status: CP
Start: 2019-10-29 — End: 2019-11-28

## 2019-10-31 ENCOUNTER — Encounter: Payer: Medicaid Other | Admitting: Speech Pathology

## 2019-10-31 ENCOUNTER — Institutional Professional Consult (permissible substitution): Admit: 2019-10-31 | Discharge: 2019-11-01 | Payer: MEDICAID | Attending: Internal Medicine | Primary: Internal Medicine

## 2019-10-31 MED ORDER — METRONIDAZOLE 250 MG TABLET
ORAL_TABLET | Freq: Two times a day (BID) | ORAL | 0 refills | 14.00000 days | Status: CP
Start: 2019-10-31 — End: 2019-11-14

## 2019-10-31 MED ORDER — CYANOCOBALAMIN (VIT B-12) 1,000 MCG/ML INJECTION SOLUTION
SUBCUTANEOUS | 3 refills | 84.00000 days | Status: CP
Start: 2019-10-31 — End: 2019-10-31

## 2019-10-31 MED ORDER — CYANOCOBALAMIN (VIT B-12) 1,000 MCG/ML INJECTION SOLUTION: 1 mL | mL | 3 refills | 84 days | Status: AC

## 2019-10-31 MED ORDER — CIPROFLOXACIN 250 MG TABLET
ORAL_TABLET | Freq: Two times a day (BID) | ORAL | 0 refills | 14.00000 days | Status: CP
Start: 2019-10-31 — End: 2019-11-14

## 2019-11-04 ENCOUNTER — Ambulatory Visit: Admit: 2019-11-04 | Discharge: 2019-11-05 | Payer: MEDICAID

## 2019-11-04 DIAGNOSIS — Z01818 Encounter for other preprocedural examination: Principal | ICD-10-CM

## 2019-11-04 DIAGNOSIS — K50018 Crohn's disease of small intestine with other complication: Principal | ICD-10-CM

## 2019-11-04 DIAGNOSIS — F418 Other specified anxiety disorders: Principal | ICD-10-CM

## 2019-11-04 DIAGNOSIS — M797 Fibromyalgia: Principal | ICD-10-CM

## 2019-11-04 DIAGNOSIS — I10 Essential (primary) hypertension: Principal | ICD-10-CM

## 2019-11-04 DIAGNOSIS — K0261 Dental caries on smooth surface limited to enamel: Principal | ICD-10-CM

## 2019-11-04 DIAGNOSIS — G8929 Other chronic pain: Principal | ICD-10-CM

## 2019-11-04 DIAGNOSIS — F17200 Nicotine dependence, unspecified, uncomplicated: Principal | ICD-10-CM

## 2019-11-04 DIAGNOSIS — D509 Iron deficiency anemia, unspecified: Principal | ICD-10-CM

## 2019-11-04 MED ORDER — CARBAMAZEPINE ER 200 MG PO TB12: ORAL_TABLET | ORAL | 3 refills | Status: DC

## 2019-11-04 NOTE — Telephone Encounter (Signed)
I will send a prescription for brand name carbamazepine.

## 2019-11-04 NOTE — Telephone Encounter (Signed)
Per Dr.Willis pt was prescribed medication for neuropathic pain.

## 2019-11-04 NOTE — Addendum Note (Signed)
Addended by: Kathrynn Ducking on: 11/04/2019 10:45 AM   Modules accepted: Orders

## 2019-11-04 NOTE — Telephone Encounter (Signed)
I called  tracks to stated PA was done 10/24/2019 and pt paid cash money to get medication. They stated the generic carbamazepine 243m is the non preferred drug for medication that's why the PA was denied. They stated if the provider prescribes the brand name carbamazepine 2053mit would be cover and not require a PA. I stated message will be sent to Dr. WIJannifer Franklin

## 2019-11-06 MED ORDER — ERGOCALCIFEROL (VITAMIN D2) 1,250 MCG (50,000 UNIT) CAPSULE
ORAL_CAPSULE | ORAL | 1 refills | 84.00000 days | Status: CP
Start: 2019-11-06 — End: 2020-11-05

## 2019-11-07 ENCOUNTER — Ambulatory Visit: Payer: Medicaid Other | Admitting: Podiatry

## 2019-11-07 ENCOUNTER — Encounter: Payer: Medicaid Other | Admitting: Speech Pathology

## 2019-11-07 ENCOUNTER — Ambulatory Visit: Admit: 2019-11-07 | Discharge: 2019-11-08 | Payer: MEDICAID

## 2019-11-07 ENCOUNTER — Encounter: Admit: 2019-11-07 | Discharge: 2019-11-07 | Payer: MEDICAID | Attending: General Practice | Primary: General Practice

## 2019-11-07 DIAGNOSIS — Z01818 Encounter for other preprocedural examination: Principal | ICD-10-CM

## 2019-11-08 DIAGNOSIS — K610 Anal abscess: Principal | ICD-10-CM

## 2019-11-08 DIAGNOSIS — K50818 Crohn's disease of both small and large intestine with other complication: Principal | ICD-10-CM

## 2019-11-08 MED ORDER — METRONIDAZOLE 250 MG TABLET
ORAL_TABLET | Freq: Two times a day (BID) | ORAL | 0 refills | 14.00000 days | Status: CP
Start: 2019-11-08 — End: 2019-11-22

## 2019-11-08 MED ORDER — ERGOCALCIFEROL (VITAMIN D2) 1,250 MCG (50,000 UNIT) CAPSULE
ORAL_CAPSULE | ORAL | 1 refills | 84.00000 days | Status: CP
Start: 2019-11-08 — End: 2020-11-07

## 2019-11-08 MED ORDER — CIPROFLOXACIN 250 MG TABLET
ORAL_TABLET | Freq: Two times a day (BID) | ORAL | 0 refills | 14.00000 days | Status: CP
Start: 2019-11-08 — End: 2019-11-22

## 2019-11-11 ENCOUNTER — Ambulatory Visit: Admit: 2019-11-11 | Discharge: 2019-11-11 | Payer: MEDICAID

## 2019-11-11 ENCOUNTER — Encounter: Admit: 2019-11-11 | Discharge: 2019-11-11 | Payer: MEDICAID | Attending: Anesthesiology | Primary: Anesthesiology

## 2019-11-11 MED ORDER — CLINDAMYCIN HCL 300 MG CAPSULE
ORAL_CAPSULE | Freq: Four times a day (QID) | ORAL | 0 refills | 7 days | Status: CP
Start: 2019-11-11 — End: ?

## 2019-11-20 ENCOUNTER — Encounter: Payer: Medicaid Other | Admitting: Neurology

## 2019-11-20 ENCOUNTER — Ambulatory Visit: Payer: Medicaid Other | Admitting: Neurology

## 2019-11-20 ENCOUNTER — Ambulatory Visit (INDEPENDENT_AMBULATORY_CARE_PROVIDER_SITE_OTHER): Payer: Medicaid Other | Admitting: Neurology

## 2019-11-20 ENCOUNTER — Other Ambulatory Visit: Payer: Self-pay

## 2019-11-20 ENCOUNTER — Encounter: Payer: Self-pay | Admitting: Neurology

## 2019-11-20 DIAGNOSIS — M797 Fibromyalgia: Secondary | ICD-10-CM

## 2019-11-20 DIAGNOSIS — R202 Paresthesia of skin: Secondary | ICD-10-CM

## 2019-11-20 MED ORDER — LEVETIRACETAM 250 MG PO TABS: 250.0000 mg | ORAL_TABLET | Freq: Two times a day (BID) | ORAL | 2 refills | Status: DC

## 2019-11-20 NOTE — Progress Notes (Addendum)
The patient comes in for EMG nerve conduction study evaluation.  She has had an increase intensity of burning dysesthesias of the feet and hands.  Nerve conduction studies showed no evidence of neuropathy, EMG of the right leg is normal.  The patient be sent for further blood work.  She was placed on carbamazepine but currently has had some increased problems with myoclonus and indicated that the medication did not help.  We will stop the carbamazepine and start Keppra.      [place]    Nerve / Sites Muscle Latency Ref. Amplitude Ref. Rel Amp Segments Distance Velocity Ref. Area    ms ms mV mV %  cm m/s m/s mVms  R Median - APB     Wrist APB 3.1 ?4.4 6.2 ?4.0 100 Wrist - APB 7   24.5     Upper arm APB 6.9  6.2  100 Upper arm - Wrist 20 53 ?49 24.4  R Ulnar - ADM     Wrist ADM 2.0 ?3.3 8.0 ?6.0 100 Wrist - ADM 7   28.4     B.Elbow ADM 4.9  7.3  91.1 B.Elbow - Wrist 18 62 ?49 27.5     A.Elbow ADM 6.5  7.2  99.6 A.Elbow - B.Elbow 10 63 ?49 26.1  R Peroneal - EDB     Ankle EDB 4.1 ?6.5 7.9 ?2.0 100 Ankle - EDB 9   30.8     Fib head EDB 9.3  7.6  96.6 Fib head - Ankle 27 52 ?44 30.4     Pop fossa EDB 11.4  7.5  98.3 Pop fossa - Fib head 10 48 ?44 29.9         Pop fossa - Ankle      L Peroneal - EDB     Ankle EDB 3.9 ?6.5 6.2 ?2.0 100 Ankle - EDB 9   24.8     Fib head EDB 9.4  6.0  96 Fib head - Ankle 27 49 ?44 24.3     Pop fossa EDB 11.5  5.9  99.3 Pop fossa - Fib head 10 48 ?44 25.9         Pop fossa - Ankle      R Tibial - AH     Ankle AH 3.5 ?5.8 10.3 ?4.0 100 Ankle - AH 9   17.5     Pop fossa AH 11.1  8.0  78.1 Pop fossa - Ankle 36 47 ?41 17.8  L Tibial - AH     Ankle AH 3.5 ?5.8 7.5 ?4.0 100 Ankle - AH 9   15.2     Pop fossa AH 11.2  6.5  86.3 Pop fossa - Ankle 35 46 ?41 15.8                    SNC    Nerve / Sites Rec. Site Peak Lat Ref.  Amp Ref. Segments Distance    ms ms V V  cm  R Sural - Ankle (Calf)     Calf Ankle 3.6 ?4.4 13 ?6 Calf - Ankle 14  L Sural - Ankle (Calf)      Calf Ankle 4.1 ?4.4 10 ?6 Calf - Ankle 14  R Superficial peroneal - Ankle     Lat leg Ankle 3.8 ?4.4 7 ?6 Lat leg - Ankle 14  L Superficial peroneal - Ankle     Lat leg Ankle 4.3 ?4.4 3 ?6 Lat leg - Ankle 14  R Median -  Orthodromic (Dig II, Mid palm)     Dig II Wrist 2.9 ?3.4 12 ?10 Dig II - Wrist 13  R Ulnar - Orthodromic, (Dig V, Mid palm)     Dig V Wrist 2.4 ?3.1 9 ?5 Dig V - Wrist 49                 F  Wave    Nerve F Lat Ref.   ms ms  R Tibial - AH 46.5 ?56.0  L Tibial - AH 47.0 ?56.0  R Ulnar - ADM 24.1 ?32.0

## 2019-11-20 NOTE — Progress Notes (Signed)
Please refer to EMG and nerve conduction procedure note.  

## 2019-11-20 NOTE — Procedures (Signed)
     HISTORY:  [name] is a 39 year old patient with fibromyalgia and a chronic pain syndrome.  The patient has had an increase in burning dysesthesias of the feet and hands recently, she is being evaluated for a possible underlying neuropathy.  NERVE CONDUCTION STUDIES:  Nerve conduction studies were performed on the right upper extremity. The distal motor latencies and motor amplitudes for the median and ulnar nerves were within normal limits. The nerve conduction velocities for these nerves were also normal. The sensory latencies for the median and ulnar nerves were normal. The F wave latency for the ulnar nerve was within normal limits.  Nerve conduction studies were performed on both lower extremities. The distal motor latencies and motor amplitudes for the peroneal and posterior tibial nerves were within normal limits. The nerve conduction velocities for these nerves were also normal. The sensory latencies for the peroneal and sural nerves were within normal limits. The F wave latencies for the posterior tibial nerves were within normal limits.   EMG STUDIES:  EMG study was performed on the right lower extremity:  The tibialis anterior muscle reveals 2 to 4K motor units with full recruitment. No fibrillations or positive waves were seen. The peroneus tertius muscle reveals 2 to 4K motor units with full recruitment. No fibrillations or positive waves were seen. The medial gastrocnemius muscle reveals 1 to 3K motor units with full recruitment. No fibrillations or positive waves were seen. The vastus lateralis muscle reveals 2 to 4K motor units with full recruitment. No fibrillations or positive waves were seen. The iliopsoas muscle reveals 2 to 4K motor units with full recruitment. No fibrillations or positive waves were seen. The biceps femoris muscle (long head) reveals 2 to 4K motor units with full recruitment. No fibrillations or positive waves were seen. The lumbosacral  paraspinal muscles were tested at 3 levels, and revealed no abnormalities of insertional activity at all 3 levels tested. There was good relaxation.   IMPRESSION:  Nerve conduction studies done on the right upper extremity and both lower extremities were unremarkable, no evidence of a neuropathy was seen.  EMG evaluation of the right lower extremity was normal, no evidence of an overlying lumbosacral radiculopathy was seen.  Jill Alexanders MD 11/20/2019 3:31 PM  [facility] [address] [place], [place] [address]  Phone [phone] Fax [phone]

## 2019-11-22 LAB — COPPER, SERUM: Copper: 137 ug/dL (ref 80–158)

## 2019-11-22 LAB — ANGIOTENSIN CONVERTING ENZYME: Angio Convert Enzyme: 51 U/L (ref 14–82)

## 2019-11-22 LAB — SEDIMENTATION RATE: Sed Rate: 6 mm/hr (ref 0–32)

## 2019-11-22 LAB — HEPATITIS C ANTIBODY: Hep C Virus Ab: <0.1 s/co ratio (ref 0.0–0.9)

## 2019-11-22 LAB — ANA W/REFLEX: Anti Nuclear Antibody (ANA): NEGATIVE

## 2019-11-22 LAB — B. BURGDORFI ANTIBODIES: Lyme IgG/IgM Ab: <0.91 {ISR} (ref 0.00–0.90)

## 2019-11-22 MED ORDER — ONDANSETRON 8 MG DISINTEGRATING TABLET
ORAL_TABLET | Freq: Three times a day (TID) | ORAL | 0 refills | 20 days | Status: CP | PRN
Start: 2019-11-22 — End: ?
  Filled 2019-11-22: qty 60, 20d supply, fill #0

## 2019-11-22 MED FILL — FAMOTIDINE 40 MG TABLET: 30 days supply | Qty: 30 | Fill #7 | Status: AC

## 2019-11-22 MED FILL — ONDANSETRON 8 MG DISINTEGRATING TABLET: 20 days supply | Qty: 60 | Fill #0 | Status: AC

## 2019-11-22 MED FILL — DIPHENOXYLATE-ATROPINE 2.5 MG-0.025 MG TABLET: ORAL | 30 days supply | Qty: 180 | Fill #1

## 2019-11-22 MED FILL — ESOMEPRAZOLE MAGNESIUM 40 MG CAPSULE,DELAYED RELEASE: ORAL | 30 days supply | Qty: 60 | Fill #5

## 2019-11-22 MED FILL — FAMOTIDINE 40 MG TABLET: ORAL | 30 days supply | Qty: 30 | Fill #7

## 2019-11-22 MED FILL — ESOMEPRAZOLE MAGNESIUM 40 MG CAPSULE,DELAYED RELEASE: 30 days supply | Qty: 60 | Fill #5 | Status: AC

## 2019-11-22 MED FILL — DIPHENOXYLATE-ATROPINE 2.5 MG-0.025 MG TABLET: 30 days supply | Qty: 180 | Fill #1 | Status: AC

## 2019-11-28 ENCOUNTER — Ambulatory Visit: Payer: Medicaid Other | Admitting: Podiatry

## 2019-11-28 ENCOUNTER — Other Ambulatory Visit: Payer: Self-pay

## 2019-11-28 VITALS — Temp 97.5°F

## 2019-11-28 DIAGNOSIS — M216X1 Other acquired deformities of right foot: Secondary | ICD-10-CM | POA: Diagnosis not present

## 2019-11-28 DIAGNOSIS — M722 Plantar fascial fibromatosis: Secondary | ICD-10-CM | POA: Diagnosis not present

## 2019-11-28 DIAGNOSIS — M21619 Bunion of unspecified foot: Secondary | ICD-10-CM

## 2019-12-01 NOTE — Progress Notes (Signed)
  Subjective:  Patient ID: [name], female    DOB: Mar 31, 1981,  MRN: [id number]  Chief Complaint  Patient presents with  . Follow-up    Bilateral foot, leg, and back pain. Pt stated, "The injections helped for about 1 week, then my pain became worse. 8/10 most of the time - sometimes worse. I was told that I would have to stop smoking for 6 weeks prior to surgery, and that's impossible for me".  . Foot Problem    Pt added, "My fingers and toes are always cold, and they feel numb".    39 y.o. female presents with the above complaint. History confirmed with patient.   Objective:  Physical Exam: warm, good capillary refill, no trophic changes or ulcerative lesions, normal DP and PT pulses and normal sensory exam. Left Foot: bunion deformity noted  Right Foot: bunion deformity noted pain to palpation about the right medial calcaneal tuber  No images are attached to the encounter.  Assessment:   1. Bunion   2. Plantar fasciitis      Plan:  Patient was evaluated and treated and all questions answered.  Bunion -Discussed again with patient importance of quitting smoking prior to surgery.  States that she cannot do this.  I discussed that given the concern for poor healing we cannot proceed with surgery without this.  I advised her that I will be happy to proceed should she quit smoking at least for her postoperative course.  Patient verbalized understanding.  Plantar fasciitis R -Injection delivered right heel   Procedure: Injection Tendon/Ligament Consent: Verbal consent obtained. Location: Right plantar fascia at the glabrous junction; medial approach. Skin Prep: Alcohol. Injectate: 1 cc 0.5% marcaine plain, 1 cc dexamethasone phosphate, 0.5 cc kenalog 10. Disposition: Patient tolerated procedure well. Injection site dressed with a band-aid.      No follow-ups on file.

## 2019-12-02 DIAGNOSIS — F411 Generalized anxiety disorder: Principal | ICD-10-CM

## 2019-12-02 MED ORDER — DEXTROAMPHETAMINE-AMPHETAMINE 10 MG TABLET
ORAL_TABLET | Freq: Two times a day (BID) | ORAL | 0 refills | 30 days | Status: CP
Start: 2019-12-02 — End: ?

## 2019-12-03 DIAGNOSIS — F411 Generalized anxiety disorder: Principal | ICD-10-CM

## 2019-12-03 MED ORDER — ALPRAZOLAM 1 MG TABLET
ORAL_TABLET | Freq: Two times a day (BID) | ORAL | 1 refills | 30 days | PRN
Start: 2019-12-03 — End: ?

## 2019-12-17 ENCOUNTER — Telehealth: Admit: 2019-12-17 | Discharge: 2019-12-18 | Payer: MEDICAID | Attending: Family | Primary: Family

## 2019-12-17 MED ORDER — QUETIAPINE 100 MG TABLET
ORAL_TABLET | Freq: Every evening | ORAL | 0 refills | 30.00000 days | Status: CP
Start: 2019-12-17 — End: 2020-01-16

## 2019-12-17 MED ORDER — CLONAZEPAM 0.5 MG TABLET
ORAL_TABLET | Freq: Three times a day (TID) | ORAL | 0 refills | 30.00000 days | Status: CP
Start: 2019-12-17 — End: ?

## 2019-12-18 ENCOUNTER — Ambulatory Visit: Payer: Medicaid Other | Admitting: Neurology

## 2019-12-18 ENCOUNTER — Telehealth: Payer: Self-pay | Admitting: Neurology

## 2019-12-18 NOTE — Telephone Encounter (Signed)
Phone rep checked office voicemail's,at 12:54 pt left message wanting to know about the status of her gabapentin (NEURONTIN) 800 MG tablet.  Pt is asking for a call.

## 2019-12-18 NOTE — Telephone Encounter (Signed)
Please give pt the following message below. I tried calling her home number and it stated message cannot be left. I tried calling her cell number and it stated voice mail not set up  Please tell her I call the pharmacy and they refill her gabapentin today. I just spoke with them. IN February 2021 she was given 6 refills that should last her until august 2021. She needs to call the pharmacy about this.

## 2019-12-18 NOTE — Telephone Encounter (Signed)
I called [facility] pharmacy and spoke with tech. They stated the gabapentin was filled today. They stated pt has 3 refills left on it.

## 2019-12-31 ENCOUNTER — Emergency Department (HOSPITAL_COMMUNITY)
Admission: EM | Admit: 2019-12-31 | Discharge: 2019-12-31 | Disposition: A | Discharge status: Home / Self Care | Payer: Medicaid Other | Attending: Emergency Medicine | Admitting: Emergency Medicine

## 2019-12-31 ENCOUNTER — Encounter (HOSPITAL_COMMUNITY): Payer: Self-pay

## 2019-12-31 ENCOUNTER — Other Ambulatory Visit: Payer: Self-pay

## 2019-12-31 DIAGNOSIS — Z5321 Procedure and treatment not carried out due to patient leaving prior to being seen by health care provider: Secondary | ICD-10-CM | POA: Insufficient documentation

## 2019-12-31 DIAGNOSIS — M7918 Myalgia, other site: Secondary | ICD-10-CM | POA: Insufficient documentation

## 2019-12-31 MED ORDER — DEXTROAMPHETAMINE-AMPHETAMINE 10 MG TABLET
ORAL_TABLET | Freq: Two times a day (BID) | ORAL | 0 refills | 30.00000 days | Status: CP
Start: 2019-12-31 — End: ?
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
## 2020-01-01 DIAGNOSIS — R21 Rash and other nonspecific skin eruption: Principal | ICD-10-CM

## 2020-01-01 DIAGNOSIS — K50818 Crohn's disease of both small and large intestine with other complication: Principal | ICD-10-CM

## 2020-01-01 MED ORDER — ESOMEPRAZOLE MAGNESIUM 40 MG CAPSULE,DELAYED RELEASE
ORAL_CAPSULE | Freq: Two times a day (BID) | ORAL | 5 refills | 30.00000 days
Start: 2020-01-01 — End: 2020-12-31

## 2020-01-01 MED ORDER — ONDANSETRON 8 MG DISINTEGRATING TABLET
ORAL_TABLET | Freq: Three times a day (TID) | ORAL | 0 refills | 20.00000 days | Status: CP | PRN
Start: 2020-01-01 — End: ?
  Filled 2020-01-03: qty 60, 20d supply, fill #0

## 2020-01-02 MED ORDER — ESOMEPRAZOLE MAGNESIUM 40 MG CAPSULE,DELAYED RELEASE
ORAL_CAPSULE | Freq: Two times a day (BID) | ORAL | 5 refills | 30 days | Status: CP
Start: 2020-01-02 — End: 2021-01-01
  Filled 2020-01-03: qty 60, 30d supply, fill #0

## 2020-01-03 MED FILL — SODIUM FLUORIDE 1.1 % DENTAL PASTE: 30 days supply | Qty: 100 | Fill #0 | Status: AC

## 2020-01-03 MED FILL — FAMOTIDINE 40 MG TABLET: 30 days supply | Qty: 30 | Fill #8 | Status: AC

## 2020-01-03 MED FILL — ESOMEPRAZOLE MAGNESIUM 40 MG CAPSULE,DELAYED RELEASE: 30 days supply | Qty: 60 | Fill #0 | Status: AC

## 2020-01-03 MED FILL — ONDANSETRON 8 MG DISINTEGRATING TABLET: 20 days supply | Qty: 60 | Fill #0 | Status: AC

## 2020-01-03 MED FILL — SODIUM FLUORIDE 1.1 % DENTAL PASTE: DENTAL | 30 days supply | Qty: 100 | Fill #0

## 2020-01-03 MED FILL — FAMOTIDINE 40 MG TABLET: ORAL | 30 days supply | Qty: 30 | Fill #8

## 2020-01-09 ENCOUNTER — Encounter (HOSPITAL_COMMUNITY): Payer: Self-pay | Admitting: Emergency Medicine

## 2020-01-09 ENCOUNTER — Other Ambulatory Visit: Payer: Self-pay

## 2020-01-09 ENCOUNTER — Emergency Department (HOSPITAL_COMMUNITY): Payer: Medicaid Other

## 2020-01-09 ENCOUNTER — Emergency Department (HOSPITAL_COMMUNITY)
Admission: EM | Admit: 2020-01-09 | Discharge: 2020-01-09 | Disposition: A | Discharge status: Home / Self Care | Payer: Medicaid Other | Attending: Emergency Medicine | Admitting: Emergency Medicine

## 2020-01-09 DIAGNOSIS — I10 Essential (primary) hypertension: Secondary | ICD-10-CM | POA: Diagnosis not present

## 2020-01-09 DIAGNOSIS — Z881 Allergy status to other antibiotic agents status: Secondary | ICD-10-CM | POA: Insufficient documentation

## 2020-01-09 DIAGNOSIS — Y999 Unspecified external cause status: Secondary | ICD-10-CM | POA: Diagnosis not present

## 2020-01-09 DIAGNOSIS — Z880 Allergy status to penicillin: Secondary | ICD-10-CM | POA: Insufficient documentation

## 2020-01-09 DIAGNOSIS — S29011A Strain of muscle and tendon of front wall of thorax, initial encounter: Secondary | ICD-10-CM | POA: Insufficient documentation

## 2020-01-09 DIAGNOSIS — T148XXA Other injury of unspecified body region, initial encounter: Secondary | ICD-10-CM

## 2020-01-09 DIAGNOSIS — Y92410 Unspecified street and highway as the place of occurrence of the external cause: Secondary | ICD-10-CM | POA: Insufficient documentation

## 2020-01-09 DIAGNOSIS — F17210 Nicotine dependence, cigarettes, uncomplicated: Secondary | ICD-10-CM | POA: Diagnosis not present

## 2020-01-09 DIAGNOSIS — Z79899 Other long term (current) drug therapy: Secondary | ICD-10-CM | POA: Diagnosis not present

## 2020-01-09 DIAGNOSIS — Y9389 Activity, other specified: Secondary | ICD-10-CM | POA: Diagnosis not present

## 2020-01-09 DIAGNOSIS — R0789 Other chest pain: Secondary | ICD-10-CM

## 2020-01-09 DIAGNOSIS — S299XXA Unspecified injury of thorax, initial encounter: Secondary | ICD-10-CM | POA: Diagnosis present
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
## 2020-01-09 MED ORDER — QUETIAPINE 100 MG TABLET
ORAL_TABLET | 1 refills | 0 days
Start: 2020-01-09 — End: ?

## 2020-01-09 MED ORDER — OXYCODONE-ACETAMINOPHEN 5-325 MG PO TABS: 1.0000 | ORAL_TABLET | Freq: Three times a day (TID) | ORAL | 0 refills | Status: DC | PRN

## 2020-01-09 MED ORDER — HYDROCODONE-ACETAMINOPHEN 5-325 MG PO TABS
1.0000 | ORAL_TABLET | Freq: Once | ORAL | Status: DC
  Filled 2020-01-09: qty 1

## 2020-01-09 MED ORDER — OXYCODONE-ACETAMINOPHEN 5-325 MG PO TABS
1.0000 | ORAL_TABLET | ORAL | Status: DC | PRN
  Administered 2020-01-09: 1 via ORAL
  Filled 2020-01-09: qty 1
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
## 2020-01-09 NOTE — Discharge Instructions (Addendum)
Please return to the emergency department if your symptoms worsen.  I have given you referral to an orthopedist.  If your symptoms have not improved in 1 to 2 weeks feel free to reach out to them for reevaluation.  Please do not mix the narcotic pain medication you were prescribed with alcohol.  Do not take it before driving.

## 2020-01-15 MED ORDER — QUETIAPINE 100 MG TABLET
ORAL_TABLET | 0 refills | 0 days
Start: 2020-01-15 — End: ?

## 2020-01-15 MED ORDER — CLONAZEPAM 0.5 MG TABLET
ORAL_TABLET | 0 refills | 0 days
Start: 2020-01-15 — End: ?

## 2020-01-15 MED ORDER — IMURAN 50 MG TABLET
ORAL_TABLET | 2 refills | 0 days | Status: CP
Start: 2020-01-15 — End: ?

## 2020-01-17 ENCOUNTER — Telehealth: Admit: 2020-01-17 | Discharge: 2020-01-18 | Payer: MEDICAID | Attending: Family | Primary: Family

## 2020-01-17 DIAGNOSIS — G894 Chronic pain syndrome: Principal | ICD-10-CM

## 2020-01-17 DIAGNOSIS — G479 Sleep disorder, unspecified: Principal | ICD-10-CM

## 2020-01-17 DIAGNOSIS — F411 Generalized anxiety disorder: Principal | ICD-10-CM

## 2020-01-17 DIAGNOSIS — F900 Attention-deficit hyperactivity disorder, predominantly inattentive type: Principal | ICD-10-CM

## 2020-01-17 MED ORDER — CLONAZEPAM 1 MG TABLET
ORAL_TABLET | Freq: Two times a day (BID) | ORAL | 0 refills | 30.00000 days | Status: CP | PRN
Start: 2020-01-17 — End: ?

## 2020-01-17 MED ORDER — LAMOTRIGINE 25 MG TABLET
ORAL_TABLET | 0 refills | 0 days | Status: CP
Start: 2020-01-17 — End: ?

## 2020-01-17 MED ORDER — TRAZODONE 150 MG TABLET
ORAL_TABLET | Freq: Every evening | ORAL | 0 refills | 60.00000 days | Status: CP
Start: 2020-01-17 — End: 2020-03-17

## 2020-01-18 MED ORDER — TRAZODONE 150 MG TABLET
ORAL_TABLET | 1 refills | 0 days
Start: 2020-01-18 — End: ?

## 2020-01-20 ENCOUNTER — Other Ambulatory Visit: Payer: Self-pay | Admitting: Orthopedic Surgery

## 2020-01-20 DIAGNOSIS — S22000A Wedge compression fracture of unspecified thoracic vertebra, initial encounter for closed fracture: Secondary | ICD-10-CM

## 2020-01-20 MED ORDER — PROVIGIL 200 MG TABLET
Freq: Two times a day (BID) | ORAL | 0.00000 days
Start: 2020-01-20 — End: ?

## 2020-01-21 ENCOUNTER — Other Ambulatory Visit: Payer: Self-pay | Admitting: Orthopedic Surgery

## 2020-01-21 MED ORDER — ERGOCALCIFEROL (VITAMIN D2) 1,250 MCG (50,000 UNIT) CAPSULE
ORAL_CAPSULE | ORAL | 1 refills | 84.00000 days
Start: 2020-01-21 — End: 2021-01-20

## 2020-01-21 MED ORDER — DIPHENOXYLATE-ATROPINE 2.5 MG-0.025 MG TABLET
ORAL_TABLET | Freq: Three times a day (TID) | ORAL | 1 refills | 30.00000 days | PRN
Start: 2020-01-21 — End: ?
  Filled 2020-01-23: qty 180, 30d supply, fill #0

## 2020-01-21 MED ORDER — POTASSIUM CHLORIDE ER 20 MEQ TABLET,EXTENDED RELEASE(PART/CRYST)
ORAL_TABLET | Freq: Every day | ORAL | 5 refills | 30 days
Start: 2020-01-21 — End: 2021-01-20

## 2020-01-21 MED ORDER — ONDANSETRON 8 MG DISINTEGRATING TABLET
ORAL_TABLET | Freq: Three times a day (TID) | ORAL | 0 refills | 20 days | PRN
Start: 2020-01-21 — End: ?

## 2020-01-22 MED ORDER — ONDANSETRON 8 MG DISINTEGRATING TABLET
ORAL_TABLET | Freq: Three times a day (TID) | ORAL | 0 refills | 20 days | Status: CP | PRN
Start: 2020-01-22 — End: ?
  Filled 2020-01-23: qty 60, 20d supply, fill #0

## 2020-01-22 MED ORDER — ERGOCALCIFEROL (VITAMIN D2) 1,250 MCG (50,000 UNIT) CAPSULE
ORAL_CAPSULE | ORAL | 1 refills | 84.00000 days | Status: CP
Start: 2020-01-22 — End: 2021-01-21
  Filled 2020-01-23: qty 12, 84d supply, fill #0

## 2020-01-23 MED ORDER — POTASSIUM CHLORIDE ER 20 MEQ TABLET,EXTENDED RELEASE(PART/CRYST)
ORAL_TABLET | Freq: Every day | ORAL | 2 refills | 30.00000 days | Status: CP
Start: 2020-01-23 — End: ?
  Filled 2020-01-23: qty 30, 30d supply, fill #0

## 2020-01-23 MED FILL — CYANOCOBALAMIN (VIT B-12) 1,000 MCG/ML INJECTION SOLUTION: 84 days supply | Qty: 6 | Fill #0 | Status: AC

## 2020-01-23 MED FILL — POTASSIUM CHLORIDE ER 20 MEQ TABLET,EXTENDED RELEASE(PART/CRYST): 30 days supply | Qty: 30 | Fill #0 | Status: AC

## 2020-01-23 MED FILL — CYANOCOBALAMIN (VIT B-12) 1,000 MCG/ML INJECTION SOLUTION: SUBCUTANEOUS | 84 days supply | Qty: 6 | Fill #0

## 2020-01-23 MED FILL — ONDANSETRON 8 MG DISINTEGRATING TABLET: 20 days supply | Qty: 60 | Fill #0 | Status: AC

## 2020-01-23 MED FILL — ERGOCALCIFEROL (VITAMIN D2) 1,250 MCG (50,000 UNIT) CAPSULE: 84 days supply | Qty: 12 | Fill #0 | Status: AC

## 2020-01-23 MED FILL — DIPHENOXYLATE-ATROPINE 2.5 MG-0.025 MG TABLET: 30 days supply | Qty: 180 | Fill #0 | Status: AC

## 2020-01-27 MED ORDER — IMURAN 50 MG TABLET
ORAL_TABLET | 1 refills | 0 days
Start: 2020-01-27 — End: ?

## 2020-01-28 ENCOUNTER — Ambulatory Visit
Admission: RE | Admit: 2020-01-28 | Discharge: 2020-01-28 | Disposition: A | Discharge status: Home / Self Care | Payer: Medicaid Other | Source: Ambulatory Visit | Attending: Orthopedic Surgery | Admitting: Orthopedic Surgery

## 2020-01-28 ENCOUNTER — Other Ambulatory Visit: Payer: Self-pay

## 2020-01-28 DIAGNOSIS — S22000A Wedge compression fracture of unspecified thoracic vertebra, initial encounter for closed fracture: Secondary | ICD-10-CM
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
## 2020-01-30 MED ORDER — DEXTROAMPHETAMINE-AMPHETAMINE 10 MG TABLET
ORAL_TABLET | Freq: Two times a day (BID) | ORAL | 0 refills | 30 days | Status: CP
Start: 2020-01-30 — End: ?

## 2020-02-04 ENCOUNTER — Ambulatory Visit: Admit: 2020-02-04 | Discharge: 2020-02-05 | Payer: MEDICAID | Attending: Internal Medicine | Primary: Internal Medicine

## 2020-02-04 DIAGNOSIS — K50818 Crohn's disease of both small and large intestine with other complication: Principal | ICD-10-CM

## 2020-02-04 DIAGNOSIS — R197 Diarrhea, unspecified: Principal | ICD-10-CM

## 2020-02-04 DIAGNOSIS — K610 Anal abscess: Principal | ICD-10-CM

## 2020-02-04 MED ORDER — CIPROFLOXACIN 250 MG TABLET
ORAL_TABLET | Freq: Two times a day (BID) | ORAL | 0 refills | 10.00000 days | Status: CP
Start: 2020-02-04 — End: 2020-02-14
  Filled 2020-02-13: qty 20, 10d supply, fill #0

## 2020-02-04 MED ORDER — METRONIDAZOLE 500 MG TABLET
ORAL_TABLET | Freq: Two times a day (BID) | ORAL | 0 refills | 10.00000 days | Status: CP
Start: 2020-02-04 — End: 2020-02-14
  Filled 2020-02-13: qty 20, 10d supply, fill #0

## 2020-02-04 MED ORDER — COLESTIPOL 1 GRAM TABLET
ORAL_TABLET | Freq: Two times a day (BID) | ORAL | 11 refills | 30.00000 days | Status: CP
Start: 2020-02-04 — End: 2021-02-03
  Filled 2020-02-13: qty 120, 30d supply, fill #0

## 2020-02-07 MED ORDER — TRAMADOL 50 MG TABLET
0 days
Start: 2020-02-07 — End: ?

## 2020-02-12 ENCOUNTER — Telehealth: Payer: Self-pay | Admitting: Neurology

## 2020-02-12 NOTE — Telephone Encounter (Signed)
..   Pt understands that although there may be some limitations with this type of visit, we will take all precautions to reduce any security or privacy concerns.  Pt understands that this will be treated like an in office visit and we will file with pt's insurance, and there may be a patient responsible charge related to this service. ? ?

## 2020-02-13 ENCOUNTER — Telehealth (INDEPENDENT_AMBULATORY_CARE_PROVIDER_SITE_OTHER): Payer: Medicaid Other | Admitting: Neurology

## 2020-02-13 ENCOUNTER — Encounter: Payer: Self-pay | Admitting: Neurology

## 2020-02-13 ENCOUNTER — Ambulatory Visit: Admit: 2020-02-13 | Discharge: 2020-02-14 | Payer: MEDICAID | Attending: Dermatology | Primary: Dermatology

## 2020-02-13 DIAGNOSIS — L309 Dermatitis, unspecified: Principal | ICD-10-CM

## 2020-02-13 DIAGNOSIS — L819 Disorder of pigmentation, unspecified: Principal | ICD-10-CM

## 2020-02-13 DIAGNOSIS — R197 Diarrhea, unspecified: Principal | ICD-10-CM

## 2020-02-13 DIAGNOSIS — L739 Follicular disorder, unspecified: Principal | ICD-10-CM

## 2020-02-13 DIAGNOSIS — R61 Generalized hyperhidrosis: Principal | ICD-10-CM

## 2020-02-13 DIAGNOSIS — L905 Scar conditions and fibrosis of skin: Principal | ICD-10-CM

## 2020-02-13 DIAGNOSIS — K50818 Crohn's disease of both small and large intestine with other complication: Principal | ICD-10-CM

## 2020-02-13 DIAGNOSIS — M797 Fibromyalgia: Secondary | ICD-10-CM | POA: Diagnosis not present

## 2020-02-13 DIAGNOSIS — R404 Transient alteration of awareness: Secondary | ICD-10-CM | POA: Diagnosis not present

## 2020-02-13 MED ORDER — TRIAMCINOLONE ACETONIDE 0.1 % TOPICAL CREAM
Freq: Two times a day (BID) | TOPICAL | 5 refills | 0 days | Status: CP
Start: 2020-02-13 — End: ?

## 2020-02-13 MED ORDER — CLINDAMYCIN PHOSPHATE 1 % TOPICAL SOLUTION
Freq: Every day | TOPICAL | 5 refills | 0 days | Status: CP | PRN
Start: 2020-02-13 — End: 2021-02-12

## 2020-02-13 MED ORDER — ALUMINUM CHLORIDE 20 % TOPICAL SOLUTION
Freq: Every evening | TOPICAL | 5 refills | 0 days | Status: CP
Start: 2020-02-13 — End: 2021-02-12

## 2020-02-13 MED FILL — METRONIDAZOLE 500 MG TABLET: 10 days supply | Qty: 20 | Fill #0 | Status: AC

## 2020-02-13 MED FILL — CIPROFLOXACIN 250 MG TABLET: 10 days supply | Qty: 20 | Fill #0 | Status: AC

## 2020-02-13 MED FILL — COLESTIPOL 1 GRAM TABLET: 30 days supply | Qty: 120 | Fill #0 | Status: AC
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
## 2020-02-17 DIAGNOSIS — K50818 Crohn's disease of both small and large intestine with other complication: Principal | ICD-10-CM

## 2020-02-17 DIAGNOSIS — R197 Diarrhea, unspecified: Principal | ICD-10-CM

## 2020-02-17 MED FILL — FAMOTIDINE 40 MG TABLET: 30 days supply | Qty: 30 | Fill #9 | Status: AC

## 2020-02-17 MED FILL — FAMOTIDINE 40 MG TABLET: ORAL | 30 days supply | Qty: 30 | Fill #9

## 2020-02-17 MED FILL — ESOMEPRAZOLE MAGNESIUM 40 MG CAPSULE,DELAYED RELEASE: 30 days supply | Qty: 60 | Fill #1 | Status: AC

## 2020-02-17 MED FILL — ESOMEPRAZOLE MAGNESIUM 40 MG CAPSULE,DELAYED RELEASE: ORAL | 30 days supply | Qty: 60 | Fill #1

## 2020-02-21 MED ORDER — CLONAZEPAM 1 MG TABLET
ORAL_TABLET | 0 refills | 0 days
Start: 2020-02-21 — End: ?

## 2020-02-28 ENCOUNTER — Telehealth: Admit: 2020-02-28 | Discharge: 2020-02-29 | Payer: MEDICAID | Attending: Family | Primary: Family

## 2020-02-28 DIAGNOSIS — S069X9S Unspecified intracranial injury with loss of consciousness of unspecified duration, sequela: Principal | ICD-10-CM

## 2020-02-28 DIAGNOSIS — F09 Unspecified mental disorder due to known physiological condition: Principal | ICD-10-CM

## 2020-02-28 DIAGNOSIS — G3189 Other specified degenerative diseases of nervous system: Principal | ICD-10-CM

## 2020-02-28 DIAGNOSIS — G894 Chronic pain syndrome: Principal | ICD-10-CM

## 2020-02-28 DIAGNOSIS — F411 Generalized anxiety disorder: Principal | ICD-10-CM

## 2020-02-28 DIAGNOSIS — F900 Attention-deficit hyperactivity disorder, predominantly inattentive type: Principal | ICD-10-CM

## 2020-02-28 DIAGNOSIS — G479 Sleep disorder, unspecified: Principal | ICD-10-CM

## 2020-02-28 MED ORDER — LAMOTRIGINE 150 MG TABLET
ORAL_TABLET | Freq: Every day | ORAL | 0 refills | 60.00000 days | Status: CP
Start: 2020-02-28 — End: 2020-04-28

## 2020-02-28 MED ORDER — PRAZOSIN 1 MG CAPSULE
ORAL_CAPSULE | Freq: Every evening | ORAL | 1 refills | 90 days | Status: CP
Start: 2020-02-28 — End: 2020-04-28

## 2020-02-28 MED ORDER — DEXTROAMPHETAMINE-AMPHETAMINE 20 MG TABLET
ORAL_TABLET | Freq: Two times a day (BID) | ORAL | 0 refills | 30 days | Status: CP
Start: 2020-02-28 — End: ?

## 2020-02-28 MED ORDER — ALPRAZOLAM 1 MG TABLET
ORAL_TABLET | Freq: Two times a day (BID) | ORAL | 1 refills | 15 days | Status: CP | PRN
Start: 2020-02-28 — End: 2020-04-28

## 2020-02-28 MED ORDER — LEVETIRACETAM 100 MG/ML ORAL SOLUTION
Freq: Two times a day (BID) | ORAL | 11 refills | 30 days
Start: 2020-02-28 — End: 2021-02-27

## 2020-02-28 MED ORDER — LAMOTRIGINE 100 MG TABLET
ORAL_TABLET | Freq: Every day | ORAL | 0 refills | 14.00000 days | Status: CP
Start: 2020-02-28 — End: 2020-03-13

## 2020-03-02 ENCOUNTER — Telehealth: Payer: Self-pay | Admitting: Neurology

## 2020-03-02 NOTE — Telephone Encounter (Signed)
medicaid no auth order sent to GI. They will reach out to the patient to schedule.

## 2020-03-05 ENCOUNTER — Ambulatory Visit
Admission: RE | Admit: 2020-03-05 | Discharge: 2020-03-05 | Disposition: A | Discharge status: Home / Self Care | Payer: Medicaid Other | Source: Ambulatory Visit | Attending: Neurology | Admitting: Neurology

## 2020-03-05 DIAGNOSIS — R404 Transient alteration of awareness: Secondary | ICD-10-CM

## 2020-03-06 ENCOUNTER — Encounter: Payer: Self-pay | Admitting: Neurology

## 2020-03-06 ENCOUNTER — Telehealth: Payer: Self-pay | Admitting: Neurology

## 2020-03-06 MED ORDER — LEVETIRACETAM 500 MG PO TABS
500.0000 mg | ORAL_TABLET | Freq: Two times a day (BID) | ORAL | 1 refills | Status: DC
Start: 2020-03-06 — End: 2020-07-21
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
## 2020-03-07 MED ORDER — TRIAMCINOLONE ACETONIDE 0.1 % TOPICAL CREAM
0.00000 days
Start: 2020-03-07 — End: ?

## 2020-03-07 MED ORDER — DICLOFENAC 1 % TOPICAL GEL
0 days
Start: 2020-03-07 — End: ?

## 2020-03-08 MED ORDER — TRIAMCINOLONE ACETONIDE 0.1 % TOPICAL CREAM
Freq: Two times a day (BID) | TOPICAL | 5 refills | 0.00000 days
Start: 2020-03-08 — End: ?

## 2020-03-09 MED ORDER — ALUMINUM CHLORIDE 20 % TOPICAL SOLUTION
Freq: Every evening | TOPICAL | 5 refills | 0.00000 days
Start: 2020-03-09 — End: 2021-03-09

## 2020-03-09 MED ORDER — TRIAMCINOLONE ACETONIDE 0.1 % TOPICAL CREAM
Freq: Two times a day (BID) | TOPICAL | 5 refills | 0.00000 days
Start: 2020-03-09 — End: ?

## 2020-03-11 MED ORDER — AMMONIUM LACTATE 12 % TOPICAL CREAM
Freq: Every day | TOPICAL | 6 refills | 0 days | Status: CP
Start: 2020-03-11 — End: ?

## 2020-03-12 MED ORDER — LAMOTRIGINE 150 MG TABLET
ORAL_TABLET | Freq: Every day | ORAL | 0 refills | 30.00000 days | Status: CP
Start: 2020-03-12 — End: 2020-05-11

## 2020-03-12 MED ORDER — AMMONIUM LACTATE 12 % TOPICAL CREAM
Freq: Every day | TOPICAL | 6 refills | 0 days
Start: 2020-03-12 — End: ?

## 2020-03-12 MED ORDER — LAMOTRIGINE 150 MG TABLET: 150 mg | tablet | Freq: Every day | 0 refills | 30 days | Status: AC

## 2020-03-13 MED ORDER — ALPRAZOLAM 1 MG TABLET
ORAL_TABLET | Freq: Two times a day (BID) | ORAL | 0 refills | 30 days | Status: CP | PRN
Start: 2020-03-13 — End: 2020-04-12

## 2020-03-17 MED ORDER — FAMOTIDINE 40 MG TABLET
ORAL_TABLET | Freq: Two times a day (BID) | ORAL | 11 refills | 30 days
Start: 2020-03-17 — End: 2021-03-17

## 2020-03-18 MED ORDER — FAMOTIDINE 40 MG TABLET
ORAL_TABLET | Freq: Two times a day (BID) | ORAL | 1 refills | 90 days | Status: CP
Start: 2020-03-18 — End: 2021-03-18
  Filled 2020-03-20: qty 90, 90d supply, fill #0

## 2020-03-18 MED ORDER — AMMONIUM LACTATE 12 % TOPICAL CREAM
Freq: Every day | TOPICAL | 6 refills | 0 days | Status: CP
Start: 2020-03-18 — End: ?

## 2020-03-20 MED FILL — ESOMEPRAZOLE MAGNESIUM 40 MG CAPSULE,DELAYED RELEASE: 30 days supply | Qty: 60 | Fill #2 | Status: AC

## 2020-03-20 MED FILL — ESOMEPRAZOLE MAGNESIUM 40 MG CAPSULE,DELAYED RELEASE: ORAL | 30 days supply | Qty: 60 | Fill #2

## 2020-03-20 MED FILL — FAMOTIDINE 40 MG TABLET: 90 days supply | Qty: 90 | Fill #0 | Status: AC

## 2020-03-30 MED ORDER — DEXTROAMPHETAMINE-AMPHETAMINE 20 MG TABLET: 20 mg | tablet | Freq: Two times a day (BID) | 0 refills | 30 days | Status: AC

## 2020-03-30 MED ORDER — DEXTROAMPHETAMINE-AMPHETAMINE 20 MG TABLET
ORAL_TABLET | Freq: Two times a day (BID) | ORAL | 0 refills | 30.00000 days | Status: CN
Start: 2020-03-30 — End: ?

## 2020-04-03 ENCOUNTER — Other Ambulatory Visit: Payer: Self-pay | Admitting: Neurology

## 2020-04-06 ENCOUNTER — Encounter: Payer: Self-pay | Admitting: Podiatry

## 2020-04-06 ENCOUNTER — Other Ambulatory Visit: Payer: Self-pay

## 2020-04-06 ENCOUNTER — Ambulatory Visit (INDEPENDENT_AMBULATORY_CARE_PROVIDER_SITE_OTHER): Payer: Medicaid Other | Admitting: Podiatry

## 2020-04-06 ENCOUNTER — Ambulatory Visit: Payer: Medicaid Other

## 2020-04-06 DIAGNOSIS — M216X1 Other acquired deformities of right foot: Secondary | ICD-10-CM | POA: Diagnosis not present

## 2020-04-06 DIAGNOSIS — M722 Plantar fascial fibromatosis: Secondary | ICD-10-CM

## 2020-04-06 DIAGNOSIS — I7300 Raynaud's syndrome without gangrene: Secondary | ICD-10-CM

## 2020-04-06 DIAGNOSIS — M21619 Bunion of unspecified foot: Secondary | ICD-10-CM

## 2020-04-06 NOTE — Patient Instructions (Signed)
Bunion  A bunion is a bump on the base of the big toe that forms when the bones of the big toe joint move out of position. Bunions may be small at first, but they often get larger over time. They can make walking painful. What are the causes? A bunion may be caused by:  Wearing narrow or pointed shoes that force the big toe to press against the other toes.  Abnormal foot development that causes the foot to roll inward (pronate).  Changes in the foot that are caused by certain diseases, such as rheumatoid arthritis or polio.  A foot injury. What increases the risk? The following factors may make you more likely to develop this condition:  Wearing shoes that squeeze the toes together.  Having certain diseases, such as: ? Rheumatoid arthritis. ? Polio. ? Cerebral palsy.  Having family members who have bunions.  Being born with a foot deformity, such as flat feet or low arches.  Doing activities that put a lot of pressure on the feet, such as ballet dancing. What are the signs or symptoms? The main symptom of a bunion is a noticeable bump on the big toe. Other symptoms may include:  Pain.  Swelling around the big toe.  Redness and inflammation.  Thick or hardened skin on the big toe or between the toes.  Stiffness or loss of motion in the big toe.  Trouble with walking. How is this diagnosed? A bunion may be diagnosed based on your symptoms, medical history, and activities. You may have tests, such as:  X-rays. These allow your health care provider to check the position of the bones in your foot and look for damage to your joint. They also help your health care provider determine the severity of your bunion and the best way to treat it.  Joint aspiration. In this test, a sample of fluid is removed from the toe joint. This test may be done if you are in a lot of pain. It helps rule out diseases that cause painful swelling of the joints, such as arthritis. How is this  treated? Treatment depends on the severity of your symptoms. The goal of treatment is to relieve symptoms and prevent the bunion from getting worse. Your health care provider may recommend:  Wearing shoes that have a wide toe box.  Using bunion pads to cushion the affected area.  Taping your toes together to keep them in a normal position.  Placing a device inside your shoe (orthotics) to help reduce pressure on your toe joint.  Taking medicine to ease pain, inflammation, and swelling.  Applying heat or ice to the affected area.  Doing stretching exercises.  Surgery to remove scar tissue and move the toes back into their normal position. This treatment is rare. Follow these instructions at home: Managing pain, stiffness, and swelling   If directed, put ice on the painful area: ? Put ice in a plastic bag. ? Place a towel between your skin and the bag. ? Leave the ice on for 20 minutes, 2-3 times a day. Activity   If directed, apply heat to the affected area before you exercise. Use the heat source that your health care provider recommends, such as a moist heat pack or a heating pad. ? Place a towel between your skin and the heat source. ? Leave the heat on for 20-30 minutes. ? Remove the heat if your skin turns bright red. This is especially important if you are unable to feel pain,  heat, or cold. You may have a greater risk of getting burned.  Do exercises as told by your health care provider. General instructions  Support your toe joint with proper footwear, shoe padding, or taping as told by your health care provider.  Take over-the-counter and prescription medicines only as told by your health care provider.  Keep all follow-up visits as told by your health care provider. This is important. Contact a health care provider if your symptoms:  Get worse.  Do not improve in 2 weeks. Get help right away if you have:  Severe pain and trouble with walking. Summary  A  bunion is a bump on the base of the big toe that forms when the bones of the big toe joint move out of position.  Bunions can make walking painful.  Treatment depends on the severity of your symptoms.  Support your toe joint with proper footwear, shoe padding, or taping as told by your health care provider. This information is not intended to replace advice given to you by your health care provider. Make sure you discuss any questions you have with your health care provider. Document Revised: 01/29/2018 Document Reviewed: 12/05/2017 Elsevier Patient Education  [place].

## 2020-04-06 NOTE — Progress Notes (Signed)
Subjective:   Patient ID: [name], female   DOB: 39 y.o.   MRN: [id number]   HPI Patient presents with several problems with 1 being pain in her heels 1 being generalized foot pain at night bunion problems and also concerns about discoloration of her skin.  Patient does smoke at least a half a pack per day and is not been able to stop   ROS      Objective:  Physical Exam  Neurovascular status was found to be intact even though she does have a mottled discoloration pattern and may have some form of Raynaud's.  She is noted to have large hyperostosis medial aspect first metatarsal right over left with deviation of the hallux against the second toe bilateral.  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  She is taking gabapentin and I recommended that she continue this and I went ahead today and I did do sterile prep and injected the plantar fascial bilateral 3 mg Kenalog 5 mg Xylocaine and I advised this patient on bunion surgery which she could have done at 1 point in future but I do not think necessarily this will take care of all the pain that she experiences.  No rush to do that and she absolutely would have to stop smoking for an extended period of time before that could be done

## 2020-04-09 ENCOUNTER — Telehealth: Admit: 2020-04-09 | Discharge: 2020-04-10 | Payer: MEDICAID | Attending: Family | Primary: Family

## 2020-04-10 MED ORDER — TRAZODONE 150 MG TABLET
ORAL_TABLET | Freq: Every evening | ORAL | 0 refills | 90 days | Status: CP
Start: 2020-04-10 — End: ?

## 2020-04-10 MED ORDER — LAMOTRIGINE 150 MG TABLET
ORAL_TABLET | Freq: Every day | ORAL | 0 refills | 30 days | Status: CP
Start: 2020-04-10 — End: 2020-06-09

## 2020-04-10 MED ORDER — DEXTROAMPHETAMINE-AMPHETAMINE 20 MG TABLET: 20 mg | tablet | Freq: Two times a day (BID) | 0 refills | 30 days | Status: AC

## 2020-04-10 MED ORDER — PRAZOSIN 1 MG CAPSULE
ORAL_CAPSULE | Freq: Every evening | ORAL | 1 refills | 60 days | Status: CP
Start: 2020-04-10 — End: 2020-06-09

## 2020-04-10 MED ORDER — ALPRAZOLAM 1 MG TABLET
ORAL_TABLET | Freq: Two times a day (BID) | ORAL | 0 refills | 30.00000 days | Status: CP | PRN
Start: 2020-04-10 — End: 2020-05-10

## 2020-04-17 ENCOUNTER — Ambulatory Visit: Admit: 2020-04-17 | Discharge: 2020-04-17 | Payer: MEDICAID

## 2020-04-17 DIAGNOSIS — K50818 Crohn's disease of both small and large intestine with other complication: Principal | ICD-10-CM

## 2020-04-17 DIAGNOSIS — M2550 Pain in unspecified joint: Principal | ICD-10-CM

## 2020-04-18 MED ORDER — ONDANSETRON 8 MG DISINTEGRATING TABLET
ORAL_TABLET | Freq: Three times a day (TID) | ORAL | 0 refills | 20 days | PRN
Start: 2020-04-18 — End: ?

## 2020-04-20 MED ORDER — AZATHIOPRINE 50 MG TABLET
ORAL_TABLET | 0 refills | 0 days | Status: CN
Start: 2020-04-20 — End: ?

## 2020-04-20 MED ORDER — BD SAFETY-LOK DETACHABLE NEEDLE 3 ML 25 GAUGE X 5/8" SYRINGE
0 refills | 0.00000 days | Status: CP
Start: 2020-04-20 — End: ?
  Filled 2020-06-04: qty 1, 30d supply, fill #0

## 2020-04-20 MED ORDER — ONDANSETRON 8 MG DISINTEGRATING TABLET
ORAL_TABLET | Freq: Three times a day (TID) | ORAL | 0 refills | 20.00000 days | Status: CP | PRN
Start: 2020-04-20 — End: ?
  Filled 2020-04-21: qty 60, 20d supply, fill #0

## 2020-04-20 MED FILL — DIPHENOXYLATE-ATROPINE 2.5 MG-0.025 MG TABLET: 30 days supply | Qty: 180 | Fill #1 | Status: AC

## 2020-04-20 MED FILL — ESOMEPRAZOLE MAGNESIUM 40 MG CAPSULE,DELAYED RELEASE: ORAL | 30 days supply | Qty: 60 | Fill #3

## 2020-04-20 MED FILL — DIPHENOXYLATE-ATROPINE 2.5 MG-0.025 MG TABLET: ORAL | 30 days supply | Qty: 180 | Fill #1

## 2020-04-20 MED FILL — ESOMEPRAZOLE MAGNESIUM 40 MG CAPSULE,DELAYED RELEASE: 30 days supply | Qty: 60 | Fill #3 | Status: AC

## 2020-04-21 MED FILL — ONDANSETRON 8 MG DISINTEGRATING TABLET: 20 days supply | Qty: 60 | Fill #0 | Status: AC

## 2020-04-27 ENCOUNTER — Ambulatory Visit: Admit: 2020-04-27 | Discharge: 2020-04-27 | Payer: MEDICAID

## 2020-04-27 ENCOUNTER — Ambulatory Visit: Admit: 2020-04-27 | Discharge: 2020-04-27 | Payer: MEDICAID | Attending: Rheumatology | Primary: Rheumatology

## 2020-04-27 DIAGNOSIS — K50818 Crohn's disease of both small and large intestine with other complication: Principal | ICD-10-CM

## 2020-04-27 DIAGNOSIS — M2550 Pain in unspecified joint: Principal | ICD-10-CM

## 2020-04-27 DIAGNOSIS — M25551 Pain in right hip: Principal | ICD-10-CM

## 2020-04-27 DIAGNOSIS — G8929 Other chronic pain: Secondary | ICD-10-CM

## 2020-04-27 DIAGNOSIS — M7918 Myalgia, other site: Principal | ICD-10-CM

## 2020-04-28 ENCOUNTER — Other Ambulatory Visit: Payer: Self-pay | Admitting: *Deleted

## 2020-04-28 ENCOUNTER — Other Ambulatory Visit: Payer: Self-pay | Admitting: Neurology

## 2020-04-28 MED ORDER — MODAFINIL 200 MG PO TABS: 200.0000 mg | ORAL_TABLET | Freq: Two times a day (BID) | ORAL | 1 refills | Status: DC

## 2020-04-28 NOTE — Progress Notes (Signed)
PA submitted for Modafinil 27m on Cover my Meds. There is a 24/72hr turn around. RX ID #: [id number]: [id number] Status: PENDING   Next Steps:  The plan will fax you a determination, typically within 1 to 5 business days. [phone]

## 2020-04-29 DIAGNOSIS — R159 Full incontinence of feces: Principal | ICD-10-CM

## 2020-04-29 DIAGNOSIS — M6289 Other specified disorders of muscle: Principal | ICD-10-CM

## 2020-04-30 MED ORDER — DEXTROAMPHETAMINE-AMPHETAMINE 20 MG TABLET
ORAL_TABLET | Freq: Two times a day (BID) | ORAL | 0 refills | 30.00000 days | Status: CP
Start: 2020-04-30 — End: ?

## 2020-05-01 MED ORDER — OXYCODONE 10 MG TABLET
Freq: Three times a day (TID) | ORAL | 0.00000 days | PRN
Start: 2020-05-01 — End: ?

## 2020-05-04 MED ORDER — SIMETHICONE 125 MG CHEWABLE TABLET
ORAL_TABLET | 0 refills | 0 days | Status: CP
Start: 2020-05-04 — End: ?
  Filled 2020-05-05: qty 4, 2d supply, fill #0

## 2020-05-04 MED ORDER — POLYETHYLENE GLYCOL 3350 17 GRAM/DOSE ORAL POWDER: 119 g | g | Freq: Once | 0 refills | 1 days | Status: AC

## 2020-05-04 MED ORDER — BISACODYL 5 MG TABLET,DELAYED RELEASE
ORAL_TABLET | Freq: Once | ORAL | 0 refills | 1.00000 days | Status: CP
Start: 2020-05-04 — End: 2020-05-04
  Filled 2020-05-05: qty 2, 1d supply, fill #0

## 2020-05-04 MED ORDER — POLYETHYLENE GLYCOL 3350 17 GRAM/DOSE ORAL POWDER
Freq: Once | ORAL | 0 refills | 1.00000 days | Status: CP
Start: 2020-05-04 — End: 2020-05-04
  Filled 2020-05-05: qty 238, 1d supply, fill #0
  Filled 2020-05-05: qty 119, 1d supply, fill #0

## 2020-05-05 ENCOUNTER — Other Ambulatory Visit: Payer: Self-pay | Admitting: Neurology

## 2020-05-05 ENCOUNTER — Ambulatory Visit
Admit: 2020-05-05 | Discharge: 2020-05-06 | Payer: PRIVATE HEALTH INSURANCE | Attending: Internal Medicine | Primary: Internal Medicine

## 2020-05-05 DIAGNOSIS — R197 Diarrhea, unspecified: Principal | ICD-10-CM

## 2020-05-05 DIAGNOSIS — D649 Anemia, unspecified: Principal | ICD-10-CM

## 2020-05-05 DIAGNOSIS — K50818 Crohn's disease of both small and large intestine with other complication: Principal | ICD-10-CM

## 2020-05-05 DIAGNOSIS — Z23 Encounter for immunization: Principal | ICD-10-CM

## 2020-05-05 DIAGNOSIS — R1013 Epigastric pain: Principal | ICD-10-CM

## 2020-05-05 DIAGNOSIS — G40909 Epilepsy, unspecified, not intractable, without status epilepticus: Principal | ICD-10-CM

## 2020-05-05 DIAGNOSIS — R413 Other amnesia: Principal | ICD-10-CM

## 2020-05-05 DIAGNOSIS — H539 Unspecified visual disturbance: Principal | ICD-10-CM

## 2020-05-05 MED FILL — SIMETHICONE 125 MG CHEWABLE TABLET: 2 days supply | Qty: 4 | Fill #0 | Status: AC

## 2020-05-05 MED FILL — BISACODYL 5 MG TABLET,DELAYED RELEASE: 1 days supply | Qty: 2 | Fill #0 | Status: AC

## 2020-05-05 MED FILL — POLYETHYLENE GLYCOL 3350 17 GRAM/DOSE ORAL POWDER: 1 days supply | Qty: 238 | Fill #0 | Status: AC

## 2020-05-05 MED FILL — POLYETHYLENE GLYCOL 3350 17 GRAM/DOSE ORAL POWDER: 1 days supply | Qty: 119 | Fill #0 | Status: AC

## 2020-05-06 MED ORDER — CROMOLYN 4 % EYE DROPS
Freq: Once | OPHTHALMIC | 0.00000 days | PRN
Start: 2020-05-06 — End: ?

## 2020-05-06 MED ORDER — RESTASIS 0.05 % EYE DROPS IN A DROPPERETTE
0.00000 days
Start: 2020-05-06 — End: 2020-09-23

## 2020-05-08 DIAGNOSIS — K610 Anal abscess: Principal | ICD-10-CM

## 2020-05-08 DIAGNOSIS — K50818 Crohn's disease of both small and large intestine with other complication: Principal | ICD-10-CM

## 2020-05-08 MED ORDER — METRONIDAZOLE 250 MG TABLET
ORAL_TABLET | Freq: Two times a day (BID) | ORAL | 1 refills | 5.00000 days | Status: CP
Start: 2020-05-08 — End: ?

## 2020-05-08 MED ORDER — LAMOTRIGINE 150 MG TABLET
ORAL_TABLET | 0 refills | 0 days | Status: CP
Start: 2020-05-08 — End: ?

## 2020-05-08 MED ORDER — CIPROFLOXACIN 250 MG TABLET
ORAL_TABLET | Freq: Two times a day (BID) | ORAL | 1 refills | 10.00000 days | Status: CP
Start: 2020-05-08 — End: ?

## 2020-05-08 MED ORDER — AZATHIOPRINE 50 MG TABLET
ORAL_TABLET | Freq: Every day | ORAL | 0 refills | 45.00000 days
Start: 2020-05-08 — End: ?

## 2020-05-12 ENCOUNTER — Other Ambulatory Visit: Payer: Self-pay | Admitting: Neurology

## 2020-05-18 ENCOUNTER — Telehealth: Payer: Self-pay | Admitting: Neurology

## 2020-05-18 NOTE — Telephone Encounter (Signed)
Pt called, Medicaid ask for the reason I need this PROVIGIL 200 MG tablet. [facility] has send a 2nd request to [place]. Would like a call from the nurse.

## 2020-05-19 NOTE — Telephone Encounter (Signed)
Pt called would like a call from the nurse, PROVIGIL 200 MG tablet was due for refill on 05/06/20.

## 2020-05-21 ENCOUNTER — Telehealth: Admit: 2020-05-21 | Discharge: 2020-05-22 | Payer: MEDICAID | Attending: Family | Primary: Family

## 2020-05-21 MED ORDER — PRAZOSIN 1 MG CAPSULE
ORAL_CAPSULE | Freq: Every evening | ORAL | 0 refills | 60.00000 days | Status: CP
Start: 2020-05-21 — End: 2020-07-20

## 2020-05-21 MED ORDER — LAMOTRIGINE 150 MG TABLET
ORAL_TABLET | Freq: Every day | ORAL | 0 refills | 60.00000 days | Status: CP
Start: 2020-05-21 — End: ?

## 2020-05-27 ENCOUNTER — Ambulatory Visit
Admit: 2020-05-27 | Discharge: 2020-05-28 | Payer: MEDICAID | Attending: Clinical Neuropsychologist | Primary: Clinical Neuropsychologist

## 2020-05-28 ENCOUNTER — Telehealth: Payer: Self-pay | Admitting: *Deleted

## 2020-05-28 MED ORDER — PRAZOSIN 1 MG CAPSULE
ORAL_CAPSULE | Freq: Every evening | ORAL | 1 refills | 0 days
Start: 2020-05-28 — End: ?

## 2020-05-28 NOTE — Telephone Encounter (Signed)
Pt calling back would like a call from the nurse need refill on Provigil 200 MG tablets. [phone]

## 2020-05-28 NOTE — Telephone Encounter (Signed)
Called patient back, informed the PA was completed on 10/11 as well as the follow up questions.  Have received no additional information.  She stated she was going to follow up through the pharmacy and call her representative through her insurance company.  Patient expressed appreciation.

## 2020-05-29 MED ORDER — DEXTROAMPHETAMINE-AMPHETAMINE 20 MG TABLET
ORAL_TABLET | Freq: Two times a day (BID) | ORAL | 0 refills | 30.00000 days | Status: CP
Start: 2020-05-29 — End: 2020-06-28

## 2020-05-29 MED ORDER — PRAZOSIN 1 MG CAPSULE
ORAL_CAPSULE | Freq: Every evening | ORAL | 1 refills | 180 days
Start: 2020-05-29 — End: 2020-07-28

## 2020-06-02 NOTE — Telephone Encounter (Signed)
Pt called, spoke with Medicaid today. Need to send a PA for Provigil to Medicaid. If someone can call me back.

## 2020-06-02 NOTE — Telephone Encounter (Signed)
Pt is calling asking for an update re: her Provigil ,pt states she is coming up on a month without this.  Pt is asking if there is an alternative medication, please call.

## 2020-06-03 DIAGNOSIS — K610 Anal abscess: Principal | ICD-10-CM

## 2020-06-03 DIAGNOSIS — K50818 Crohn's disease of both small and large intestine with other complication: Principal | ICD-10-CM

## 2020-06-03 MED ORDER — CIPROFLOXACIN 250 MG TABLET
ORAL_TABLET | Freq: Two times a day (BID) | ORAL | 2 refills | 10.00000 days | Status: CP
Start: 2020-06-03 — End: ?
  Filled 2020-06-04: qty 20, 10d supply, fill #0

## 2020-06-03 MED ORDER — METRONIDAZOLE 250 MG TABLET
ORAL_TABLET | Freq: Two times a day (BID) | ORAL | 2 refills | 5 days | Status: CP
Start: 2020-06-03 — End: ?
  Filled 2020-06-04: qty 20, 5d supply, fill #0

## 2020-06-03 NOTE — Telephone Encounter (Signed)
PA completed again in [facility].  Key [id number]  our PA has been faxed to the plan as a paper copy. Please contact the plan directly if you haven't received a determination in a typical timeframe.  You will be notified of the determination via fax. How do I know if the plan approved the PA?  Add Reminder to your Dashboard Remind me in:  5 business days Contact plan to follow up on [id number]   [name] called and updated with information.  She expressed appreciation.

## 2020-06-04 MED ORDER — MAGNESIUM OXIDE 400 MG (241.3 MG MAGNESIUM) TABLET
ORAL_TABLET | Freq: Two times a day (BID) | ORAL | 11 refills | 30 days | Status: CP
Start: 2020-06-04 — End: ?
  Filled 2020-06-29: qty 60, 30d supply, fill #0

## 2020-06-04 MED FILL — BD LUER-LOK SYRINGE 3 ML 25 X 5/8": 30 days supply | Qty: 1 | Fill #0 | Status: AC

## 2020-06-04 MED FILL — ESOMEPRAZOLE MAGNESIUM 40 MG CAPSULE,DELAYED RELEASE: 30 days supply | Qty: 60 | Fill #4 | Status: AC

## 2020-06-04 MED FILL — METRONIDAZOLE 250 MG TABLET: 5 days supply | Qty: 20 | Fill #0 | Status: AC

## 2020-06-04 MED FILL — ESOMEPRAZOLE MAGNESIUM 40 MG CAPSULE,DELAYED RELEASE: ORAL | 30 days supply | Qty: 60 | Fill #4

## 2020-06-04 MED FILL — CIPROFLOXACIN 250 MG TABLET: 10 days supply | Qty: 20 | Fill #0 | Status: AC

## 2020-06-04 MED FILL — POTASSIUM CHLORIDE ER 20 MEQ TABLET,EXTENDED RELEASE(PART/CRYST): 30 days supply | Qty: 30 | Fill #1 | Status: AC

## 2020-06-04 MED FILL — POTASSIUM CHLORIDE ER 20 MEQ TABLET,EXTENDED RELEASE(PART/CRYST): ORAL | 30 days supply | Qty: 30 | Fill #1

## 2020-06-07 MED ORDER — LAMOTRIGINE 150 MG TABLET
ORAL_TABLET | 0 refills | 0 days
Start: 2020-06-07 — End: ?

## 2020-06-08 ENCOUNTER — Other Ambulatory Visit: Payer: Self-pay | Admitting: Neurology

## 2020-06-08 MED ORDER — ALPRAZOLAM 1 MG TABLET
ORAL_TABLET | Freq: Two times a day (BID) | ORAL | 0 refills | 30.00000 days | Status: CP | PRN
Start: 2020-06-08 — End: 2020-07-08

## 2020-06-08 MED FILL — FAMOTIDINE 40 MG TABLET: ORAL | 90 days supply | Qty: 90 | Fill #1

## 2020-06-08 MED FILL — FAMOTIDINE 40 MG TABLET: 90 days supply | Qty: 90 | Fill #1 | Status: AC

## 2020-06-09 MED ORDER — LAMOTRIGINE 150 MG TABLET
ORAL_TABLET | 0 refills | 0 days
Start: 2020-06-09 — End: ?

## 2020-06-11 ENCOUNTER — Telehealth: Payer: Self-pay | Admitting: Neurology

## 2020-06-11 NOTE — Telephone Encounter (Signed)
Per [employer], PA denied, confirmation code [phone] [id number]

## 2020-06-11 NOTE — Telephone Encounter (Signed)
Pt called, had a neurophysiology test at [facility]. Neurologist stated, "your brain is broken." Would like a call from the nurse. Pt would not elaborate about her test.

## 2020-06-11 NOTE — Telephone Encounter (Signed)
Called patient back.  Patient states she was scheduled for a neuro-psych at [facility] test by her GI provider.  Stated her speech has gotten worse and is requesting an office visit with Dr. Jannifer Franklin, first available is 12/23.  Has an appointment with Judson Roch, NP for 11/9 and will just keep that appointment.  Patient is bringing her neuro-psych results.  Patient would not tell me what [facility] results she had or what neuro-psych told her, patient was agitated on the phone and stated that I should work her in because that's when she wants to be seen and not wait.    Patient stated she received a denial letter from [facility] for the Modafinil 200mg .  Found denial on [place] [employer], confirmation code [phone] [id number]

## 2020-06-15 MED ORDER — DEXTROAMPHETAMINE-AMPHETAMINE 20 MG TABLET
ORAL_TABLET | Freq: Two times a day (BID) | ORAL | 0 refills | 30 days
Start: 2020-06-15 — End: 2020-07-15

## 2020-06-15 MED ORDER — LAMOTRIGINE 150 MG TABLET
ORAL_TABLET | Freq: Every day | ORAL | 0 refills | 60 days
Start: 2020-06-15 — End: ?

## 2020-06-15 MED ORDER — ALPRAZOLAM 1 MG TABLET
ORAL_TABLET | Freq: Two times a day (BID) | ORAL | 0 refills | 30.00000 days | PRN
Start: 2020-06-15 — End: 2020-07-15

## 2020-06-16 ENCOUNTER — Ambulatory Visit: Payer: Self-pay | Admitting: Neurology

## 2020-06-16 MED ORDER — ALPRAZOLAM 1 MG TABLET
ORAL_TABLET | Freq: Two times a day (BID) | ORAL | 0 refills | 30 days | PRN
Start: 2020-06-16 — End: 2020-07-16
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
## 2020-06-25 MED FILL — BD LUER-LOK SYRINGE 3 ML 25 X 5/8": 30 days supply | Qty: 1 | Fill #1

## 2020-06-25 MED FILL — BD LUER-LOK SYRINGE 3 ML 25 X 5/8": 30 days supply | Qty: 1 | Fill #1 | Status: AC

## 2020-06-26 MED ORDER — AZATHIOPRINE 50 MG TABLET
ORAL_TABLET | 0 refills | 0.00000 days
Start: 2020-06-26 — End: ?

## 2020-06-29 MED ORDER — DEXTROAMPHETAMINE-AMPHETAMINE 20 MG TABLET
ORAL_TABLET | Freq: Two times a day (BID) | ORAL | 0 refills | 30 days | Status: CP
Start: 2020-06-29 — End: 2020-07-29

## 2020-06-29 MED ORDER — AZATHIOPRINE 50 MG TABLET
ORAL_TABLET | 0 refills | 0 days | Status: CP
Start: 2020-06-29 — End: 2020-07-08

## 2020-06-29 MED FILL — MAGNESIUM OXIDE 400 MG (241.3 MG MAGNESIUM) TABLET: 30 days supply | Qty: 60 | Fill #0 | Status: AC

## 2020-06-29 MED FILL — METRONIDAZOLE 250 MG TABLET: ORAL | 5 days supply | Qty: 20 | Fill #1

## 2020-06-29 MED FILL — METRONIDAZOLE 250 MG TABLET: 5 days supply | Qty: 20 | Fill #1 | Status: AC

## 2020-07-08 MED ORDER — ERGOCALCIFEROL (VITAMIN D2) 1,250 MCG (50,000 UNIT) CAPSULE
ORAL_CAPSULE | 1 refills | 0 days
Start: 2020-07-08 — End: ?

## 2020-07-08 MED ORDER — AZATHIOPRINE 50 MG TABLET
ORAL_TABLET | ORAL | 1 refills | 0.00000 days | Status: CP
Start: 2020-07-08 — End: 2020-08-04

## 2020-07-09 MED ORDER — SIMETHICONE 125 MG CHEWABLE TABLET
ORAL_TABLET | 0 refills | 0 days | Status: CP
Start: 2020-07-09 — End: 2020-08-11
  Filled 2020-07-15: qty 4, 2d supply, fill #0

## 2020-07-09 MED ORDER — BISACODYL 5 MG TABLET,DELAYED RELEASE
ORAL_TABLET | Freq: Once | ORAL | 0 refills | 1.00000 days | Status: CP
Start: 2020-07-09 — End: 2020-07-22
  Filled 2020-07-15: qty 2, 1d supply, fill #0

## 2020-07-09 MED ORDER — POLYETHYLENE GLYCOL 3350 17 GRAM/DOSE ORAL POWDER
Freq: Once | ORAL | 0 refills | 1.00000 days | Status: CP
Start: 2020-07-09 — End: 2020-07-22

## 2020-07-09 MED ORDER — POLYETHYLENE GLYCOL 3350 17 GRAM/DOSE ORAL POWDER: 238 g | g | Freq: Once | 0 refills | 1 days | Status: AC

## 2020-07-13 ENCOUNTER — Other Ambulatory Visit: Payer: Self-pay | Admitting: Orthopedic Surgery

## 2020-07-13 DIAGNOSIS — M25552 Pain in left hip: Secondary | ICD-10-CM

## 2020-07-14 MED ORDER — ERGOCALCIFEROL (VITAMIN D2) 1,250 MCG (50,000 UNIT) CAPSULE
ORAL_CAPSULE | ORAL | 1 refills | 0.00000 days | Status: CP
Start: 2020-07-14 — End: ?

## 2020-07-15 MED FILL — POLYETHYLENE GLYCOL 3350 17 GRAM/DOSE ORAL POWDER: ORAL | 1 days supply | Qty: 238 | Fill #0

## 2020-07-15 MED FILL — POLYETHYLENE GLYCOL 3350 17 GRAM/DOSE ORAL POWDER: 1 days supply | Qty: 119 | Fill #0 | Status: AC

## 2020-07-15 MED FILL — POLYETHYLENE GLYCOL 3350 17 GRAM/DOSE ORAL POWDER: ORAL | 1 days supply | Qty: 119 | Fill #0

## 2020-07-15 MED FILL — SIMETHICONE 125 MG CHEWABLE TABLET: 2 days supply | Qty: 4 | Fill #0 | Status: AC

## 2020-07-15 MED FILL — BISACODYL 5 MG TABLET,DELAYED RELEASE: 1 days supply | Qty: 2 | Fill #0 | Status: AC

## 2020-07-15 MED FILL — POLYETHYLENE GLYCOL 3350 17 GRAM/DOSE ORAL POWDER: 1 days supply | Qty: 238 | Fill #0 | Status: AC

## 2020-07-17 MED ORDER — BD LUER-LOK SYRINGE 3 ML 25 X 5/8"
2 refills | 0 days | Status: CP
Start: 2020-07-17 — End: 2020-08-11

## 2020-07-20 ENCOUNTER — Telehealth: Admit: 2020-07-20 | Discharge: 2020-07-21 | Payer: MEDICAID | Attending: Family | Primary: Family

## 2020-07-20 DIAGNOSIS — F411 Generalized anxiety disorder: Principal | ICD-10-CM

## 2020-07-20 DIAGNOSIS — F900 Attention-deficit hyperactivity disorder, predominantly inattentive type: Principal | ICD-10-CM

## 2020-07-20 DIAGNOSIS — F4312 Post-traumatic stress disorder, chronic: Principal | ICD-10-CM

## 2020-07-20 DIAGNOSIS — F09 Unspecified mental disorder due to known physiological condition: Principal | ICD-10-CM

## 2020-07-20 MED ORDER — NALOXONE 4 MG/ACTUATION NASAL SPRAY
0 refills | 0 days | Status: CP
Start: 2020-07-20 — End: ?

## 2020-07-20 MED ORDER — ALPRAZOLAM 1 MG TABLET
ORAL_TABLET | Freq: Two times a day (BID) | ORAL | 1 refills | 30.00000 days | Status: CP | PRN
Start: 2020-07-20 — End: 2020-07-20

## 2020-07-20 MED ORDER — DEXTROAMPHETAMINE-AMPHETAMINE 20 MG TABLET: 20 mg | tablet | Freq: Two times a day (BID) | 0 refills | 30 days | Status: AC

## 2020-07-20 MED ORDER — TRAZODONE 150 MG TABLET
ORAL_TABLET | Freq: Every evening | ORAL | 0 refills | 60.00000 days | Status: CP | PRN
Start: 2020-07-20 — End: 2020-09-23

## 2020-07-20 MED ORDER — LAMOTRIGINE 150 MG TABLET
ORAL_TABLET | Freq: Every day | ORAL | 0 refills | 60 days | Status: CP
Start: 2020-07-20 — End: 2020-08-10

## 2020-07-20 MED ORDER — PRAZOSIN 1 MG CAPSULE
ORAL_CAPSULE | Freq: Every evening | ORAL | 0 refills | 60.00000 days | Status: CP
Start: 2020-07-20 — End: 2020-09-23

## 2020-07-20 MED ORDER — ALPRAZOLAM 0.5 MG TABLET
ORAL_TABLET | Freq: Three times a day (TID) | ORAL | 0 refills | 30 days | Status: CP | PRN
Start: 2020-07-20 — End: 2020-08-30

## 2020-07-21 ENCOUNTER — Ambulatory Visit: Payer: Medicaid Other | Admitting: Neurology

## 2020-07-21 ENCOUNTER — Encounter: Payer: Self-pay | Admitting: Neurology

## 2020-07-21 ENCOUNTER — Other Ambulatory Visit: Payer: Self-pay

## 2020-07-21 VITALS — BP 128/80 | HR 78 | Ht 63.0 in | Wt 142.0 lb

## 2020-07-21 DIAGNOSIS — R569 Unspecified convulsions: Secondary | ICD-10-CM

## 2020-07-21 DIAGNOSIS — M797 Fibromyalgia: Secondary | ICD-10-CM | POA: Diagnosis not present

## 2020-07-21 DIAGNOSIS — M792 Neuralgia and neuritis, unspecified: Secondary | ICD-10-CM

## 2020-07-21 MED ORDER — PRAMIPEXOLE DIHYDROCHLORIDE 1 MG PO TABS: 1.0000 mg | ORAL_TABLET | Freq: Every day | ORAL | 1 refills | Status: DC

## 2020-07-21 MED ORDER — GABAPENTIN 800 MG PO TABS: 800.0000 mg | ORAL_TABLET | Freq: Four times a day (QID) | ORAL | 6 refills | Status: DC

## 2020-07-21 MED ORDER — LEVETIRACETAM 500 MG PO TABS: 500.0000 mg | ORAL_TABLET | Freq: Two times a day (BID) | ORAL | 1 refills | Status: DC

## 2020-07-21 NOTE — Progress Notes (Signed)
I have read the note, and I agree with the clinical assessment and plan.  Dakoda Bassette K Caldwell Kronenberger   
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
## 2020-07-21 NOTE — Patient Instructions (Signed)
Continue medications, will have to check on Provigil  Continue seeing [facility] specialists Consider referral to neurology at [facility] for 2nd opinion  See you back 6 months

## 2020-07-22 ENCOUNTER — Encounter
Admit: 2020-07-22 | Discharge: 2020-07-22 | Payer: MEDICAID | Attending: Certified Registered" | Primary: Certified Registered"

## 2020-07-22 ENCOUNTER — Encounter: Admit: 2020-07-22 | Discharge: 2020-07-22 | Payer: MEDICAID

## 2020-07-22 DIAGNOSIS — K640 First degree hemorrhoids: Principal | ICD-10-CM

## 2020-07-22 DIAGNOSIS — I10 Essential (primary) hypertension: Principal | ICD-10-CM

## 2020-07-22 DIAGNOSIS — R569 Unspecified convulsions: Principal | ICD-10-CM

## 2020-07-22 DIAGNOSIS — R12 Heartburn: Principal | ICD-10-CM

## 2020-07-22 DIAGNOSIS — Z980 Intestinal bypass and anastomosis status: Principal | ICD-10-CM

## 2020-07-22 DIAGNOSIS — M797 Fibromyalgia: Principal | ICD-10-CM

## 2020-07-22 DIAGNOSIS — K2100 Gastro-esophageal reflux disease with esophagitis, without bleeding: Principal | ICD-10-CM

## 2020-07-22 DIAGNOSIS — K5000 Crohn's disease of small intestine without complications: Principal | ICD-10-CM

## 2020-07-22 DIAGNOSIS — Z880 Allergy status to penicillin: Principal | ICD-10-CM

## 2020-07-22 DIAGNOSIS — K5090 Crohn's disease, unspecified, without complications: Principal | ICD-10-CM

## 2020-07-22 DIAGNOSIS — F4310 Post-traumatic stress disorder, unspecified: Principal | ICD-10-CM

## 2020-07-22 DIAGNOSIS — F419 Anxiety disorder, unspecified: Principal | ICD-10-CM

## 2020-07-22 DIAGNOSIS — R1013 Epigastric pain: Principal | ICD-10-CM

## 2020-07-27 ENCOUNTER — Ambulatory Visit: Payer: No Typology Code available for payment source | Admitting: Physical Therapy

## 2020-07-28 MED ORDER — FAMOTIDINE 40 MG TABLET
ORAL_TABLET | Freq: Two times a day (BID) | ORAL | 1 refills | 90 days
Start: 2020-07-28 — End: 2021-07-28
  Filled 2020-08-28: qty 90, 90d supply, fill #0

## 2020-07-28 MED FILL — ESOMEPRAZOLE MAGNESIUM 40 MG CAPSULE,DELAYED RELEASE: ORAL | 30 days supply | Qty: 60 | Fill #5

## 2020-07-28 MED FILL — POTASSIUM CHLORIDE ER 20 MEQ TABLET,EXTENDED RELEASE(PART/CRYST): ORAL | 30 days supply | Qty: 30 | Fill #2

## 2020-07-28 MED FILL — MAGNESIUM OXIDE 400 MG (241.3 MG MAGNESIUM) TABLET: 30 days supply | Qty: 60 | Fill #1 | Status: AC

## 2020-07-28 MED FILL — MAGNESIUM OXIDE 400 MG (241.3 MG MAGNESIUM) TABLET: ORAL | 30 days supply | Qty: 60 | Fill #1

## 2020-07-28 MED FILL — POTASSIUM CHLORIDE ER 20 MEQ TABLET,EXTENDED RELEASE(PART/CRYST): 30 days supply | Qty: 30 | Fill #2 | Status: AC

## 2020-07-28 MED FILL — ESOMEPRAZOLE MAGNESIUM 40 MG CAPSULE,DELAYED RELEASE: 30 days supply | Qty: 60 | Fill #5 | Status: AC

## 2020-07-29 ENCOUNTER — Other Ambulatory Visit: Payer: Medicaid Other

## 2020-07-29 MED ORDER — DEXTROAMPHETAMINE-AMPHETAMINE 20 MG TABLET
ORAL_TABLET | Freq: Two times a day (BID) | ORAL | 0 refills | 30.00000 days | Status: CP
Start: 2020-07-29 — End: 2020-08-28

## 2020-08-01 MED ORDER — AZATHIOPRINE 50 MG TABLET
ORAL_TABLET | 1 refills | 0 days
Start: 2020-08-01 — End: ?

## 2020-08-04 MED ORDER — AZATHIOPRINE 50 MG TABLET
ORAL_TABLET | ORAL | 1 refills | 0.00000 days | Status: CP
Start: 2020-08-04 — End: 2020-09-07

## 2020-08-05 ENCOUNTER — Ambulatory Visit: Payer: No Typology Code available for payment source | Admitting: Physical Therapy

## 2020-08-11 ENCOUNTER — Telehealth: Admit: 2020-08-11 | Discharge: 2020-08-12 | Payer: MEDICAID | Attending: Internal Medicine | Primary: Internal Medicine

## 2020-08-11 DIAGNOSIS — K50018 Crohn's disease of small intestine with other complication: Principal | ICD-10-CM

## 2020-08-11 DIAGNOSIS — K219 Gastro-esophageal reflux disease without esophagitis: Principal | ICD-10-CM

## 2020-08-11 DIAGNOSIS — Z79899 Other long term (current) drug therapy: Principal | ICD-10-CM

## 2020-08-11 MED ORDER — BD LUER-LOK SYRINGE 3 ML 25 X 5/8": 1 | each | 0 refills | 0 days | Status: AC

## 2020-08-11 MED ORDER — NALOXONE 4 MG/ACTUATION NASAL SPRAY
0 refills | 0 days
Start: 2020-08-11 — End: ?

## 2020-08-11 MED ORDER — BD LUER-LOK SYRINGE 3 ML 25 X 5/8"
0 refills | 0.00000 days | Status: CP
Start: 2020-08-11 — End: 2020-08-11

## 2020-08-11 MED ORDER — LAMOTRIGINE 150 MG TABLET
ORAL_TABLET | Freq: Every day | ORAL | 0 refills | 60 days | Status: CP
Start: 2020-08-11 — End: 2020-09-14

## 2020-08-12 ENCOUNTER — Ambulatory Visit: Payer: Medicaid Other | Admitting: Physical Therapy

## 2020-08-13 ENCOUNTER — Ambulatory Visit
Admit: 2020-08-13 | Discharge: 2020-08-14 | Payer: MEDICAID | Attending: Orthopaedic Trauma | Primary: Orthopaedic Trauma

## 2020-08-13 ENCOUNTER — Ambulatory Visit: Admit: 2020-08-13 | Discharge: 2020-08-14 | Payer: MEDICAID

## 2020-08-13 DIAGNOSIS — K2100 Gastro-esophageal reflux disease with esophagitis, without bleeding: Principal | ICD-10-CM

## 2020-08-13 DIAGNOSIS — G8929 Other chronic pain: Principal | ICD-10-CM

## 2020-08-13 DIAGNOSIS — F909 Attention-deficit hyperactivity disorder, unspecified type: Principal | ICD-10-CM

## 2020-08-13 DIAGNOSIS — I10 Essential (primary) hypertension: Principal | ICD-10-CM

## 2020-08-13 DIAGNOSIS — F4310 Post-traumatic stress disorder, unspecified: Principal | ICD-10-CM

## 2020-08-13 DIAGNOSIS — Z880 Allergy status to penicillin: Principal | ICD-10-CM

## 2020-08-13 DIAGNOSIS — M25552 Pain in left hip: Principal | ICD-10-CM

## 2020-08-13 DIAGNOSIS — M797 Fibromyalgia: Principal | ICD-10-CM

## 2020-08-13 DIAGNOSIS — M545 Low back pain, unspecified: Principal | ICD-10-CM

## 2020-08-13 DIAGNOSIS — M25551 Pain in right hip: Principal | ICD-10-CM

## 2020-08-17 ENCOUNTER — Other Ambulatory Visit: Payer: Self-pay | Admitting: Nephrology

## 2020-08-17 DIAGNOSIS — M25552 Pain in left hip: Secondary | ICD-10-CM

## 2020-08-17 MED ORDER — LAMOTRIGINE 150 MG TABLET
ORAL_TABLET | 0 refills | 0 days
Start: 2020-08-17 — End: ?

## 2020-08-19 DIAGNOSIS — K610 Anal abscess: Principal | ICD-10-CM

## 2020-08-19 DIAGNOSIS — K50818 Crohn's disease of both small and large intestine with other complication: Principal | ICD-10-CM

## 2020-08-19 MED ORDER — METRONIDAZOLE 250 MG TABLET
ORAL_TABLET | Freq: Two times a day (BID) | ORAL | 0 refills | 30 days | Status: CP
Start: 2020-08-19 — End: ?

## 2020-08-19 MED ORDER — CIPROFLOXACIN 250 MG TABLET
ORAL_TABLET | Freq: Two times a day (BID) | ORAL | 0 refills | 30.00000 days | Status: CP
Start: 2020-08-19 — End: 2020-09-18

## 2020-08-23 ENCOUNTER — Other Ambulatory Visit: Payer: Medicaid Other

## 2020-08-23 MED ORDER — ESOMEPRAZOLE MAGNESIUM 40 MG CAPSULE,DELAYED RELEASE
ORAL_CAPSULE | Freq: Two times a day (BID) | ORAL | 5 refills | 30.00000 days
Start: 2020-08-23 — End: 2021-08-23

## 2020-08-23 MED ORDER — POTASSIUM CHLORIDE ER 20 MEQ TABLET,EXTENDED RELEASE(PART/CRYST)
ORAL_TABLET | Freq: Every day | ORAL | 2 refills | 30 days
Start: 2020-08-23 — End: ?

## 2020-08-23 MED ORDER — ONDANSETRON 8 MG DISINTEGRATING TABLET
ORAL_TABLET | Freq: Three times a day (TID) | ORAL | 0 refills | 20 days | PRN
Start: 2020-08-23 — End: ?

## 2020-08-25 MED ORDER — ESOMEPRAZOLE MAGNESIUM 40 MG CAPSULE,DELAYED RELEASE
ORAL_CAPSULE | Freq: Two times a day (BID) | ORAL | 5 refills | 30 days | Status: CP
Start: 2020-08-25 — End: 2021-08-25
  Filled 2020-08-26: qty 60, 30d supply, fill #0

## 2020-08-25 MED ORDER — ONDANSETRON 8 MG DISINTEGRATING TABLET
ORAL_TABLET | Freq: Three times a day (TID) | ORAL | 0 refills | 20.00000 days | Status: CP | PRN
Start: 2020-08-25 — End: 2020-09-14
  Filled 2020-08-26: qty 60, 20d supply, fill #0

## 2020-08-25 MED ORDER — POTASSIUM CHLORIDE ER 20 MEQ TABLET,EXTENDED RELEASE(PART/CRYST)
ORAL_TABLET | Freq: Every day | ORAL | 2 refills | 30 days | Status: CP
Start: 2020-08-25 — End: ?
  Filled 2020-08-26: qty 30, 30d supply, fill #0

## 2020-08-25 MED FILL — MAGNESIUM OXIDE 400 MG (241.3 MG MAGNESIUM) TABLET: ORAL | 30 days supply | Qty: 60 | Fill #2

## 2020-08-27 MED ORDER — DEXTROAMPHETAMINE-AMPHETAMINE 20 MG TABLET
ORAL_TABLET | Freq: Two times a day (BID) | ORAL | 0 refills | 30.00000 days | Status: CP
Start: 2020-08-27 — End: 2020-09-14

## 2020-08-30 MED ORDER — ALPRAZOLAM 0.5 MG TABLET
ORAL_TABLET | Freq: Three times a day (TID) | ORAL | 0 refills | 30 days | PRN
Start: 2020-08-30 — End: ?

## 2020-09-01 ENCOUNTER — Other Ambulatory Visit: Payer: Self-pay

## 2020-09-01 ENCOUNTER — Ambulatory Visit
Admission: RE | Admit: 2020-09-01 | Discharge: 2020-09-01 | Disposition: A | Discharge status: Home / Self Care | Payer: Medicaid Other | Source: Ambulatory Visit | Attending: Nephrology | Admitting: Nephrology

## 2020-09-01 DIAGNOSIS — M25552 Pain in left hip: Secondary | ICD-10-CM

## 2020-09-01 IMAGING — CT CT HIP*L* W/O CM
1 series · 15 of 32 positions shown, 19 images · non-contrast
Comparison: Right hip x-rays dated [DATE]. CT right hip
dated [DATE]. CT abdomen pelvis dated [DATE].

CLINICAL DATA: Bilateral hip pain. History prior right femoral neck
fracture status post ORIF.

EXAM:
CT OF THE RIGHT HIP WITHOUT CONTRAST
CT OF THE LEFT HIP WITHOUT CONTRAST
TECHNIQUE: Multidetector CT imaging of the right and left hips was performed
according to the standard protocol. Multiplanar CT image
reconstructions were also generated.

[Series 3: soft tissue pelvis/hip · axial · 0.68mm/px · z∈[-473,-236]mm · 15 of 88 slices shown, 19 images]
[im 6/88  soft-tissue]
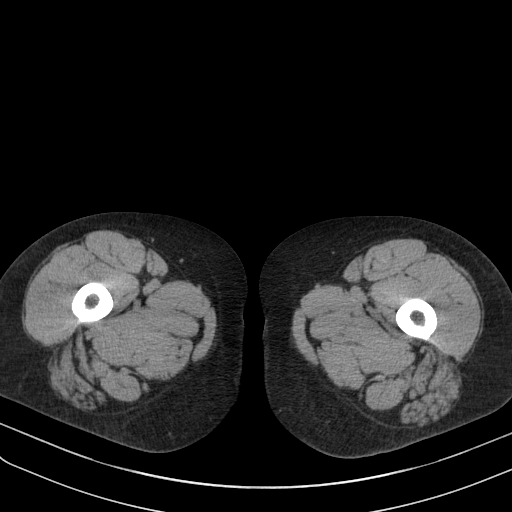
[im 6/88  bone]
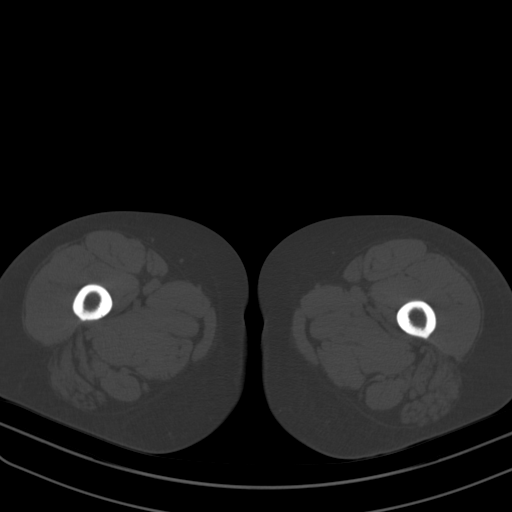
[im 12/88  soft-tissue]
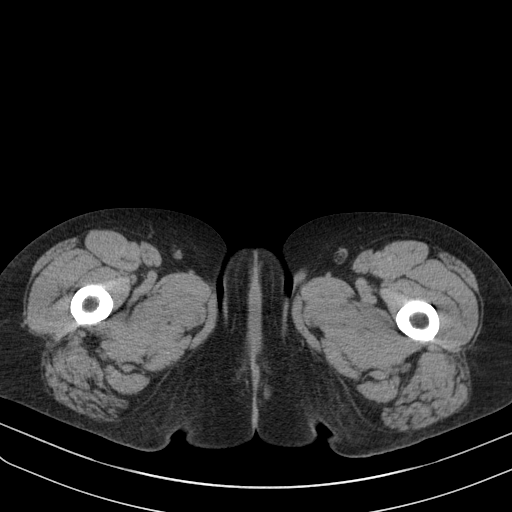
[im 17/88  soft-tissue]
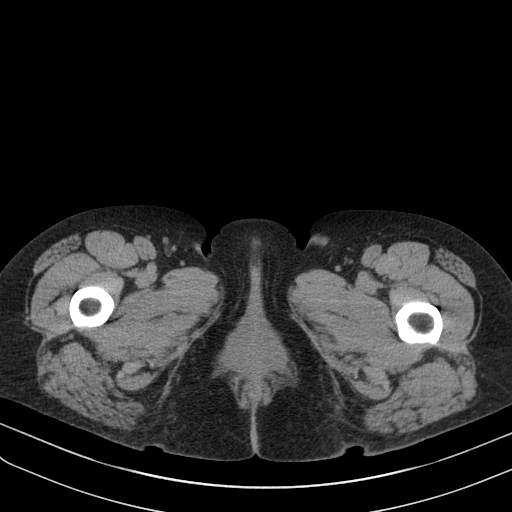
[im 26/88  soft-tissue]
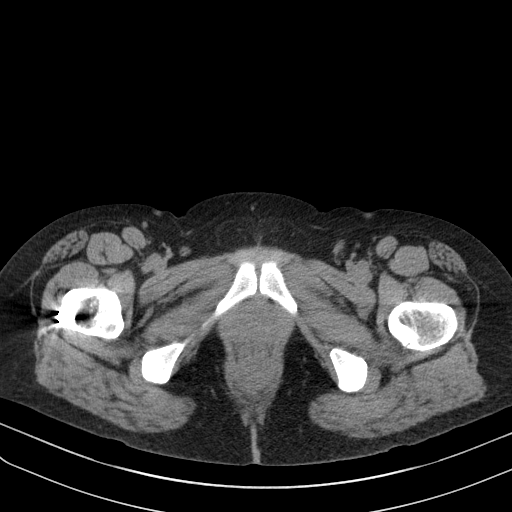
[im 31/88  soft-tissue]
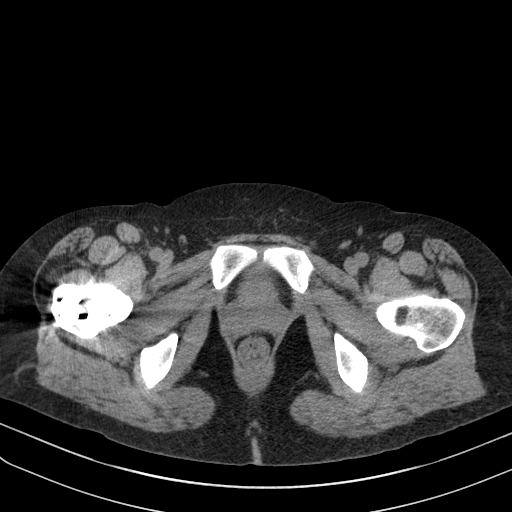
[im 37/88  soft-tissue]
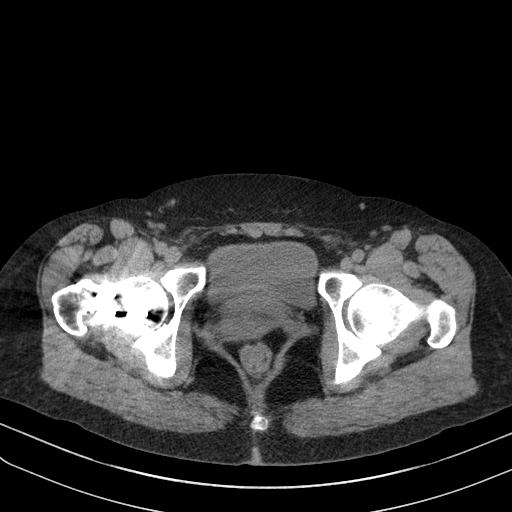
[im 45/88  soft-tissue]
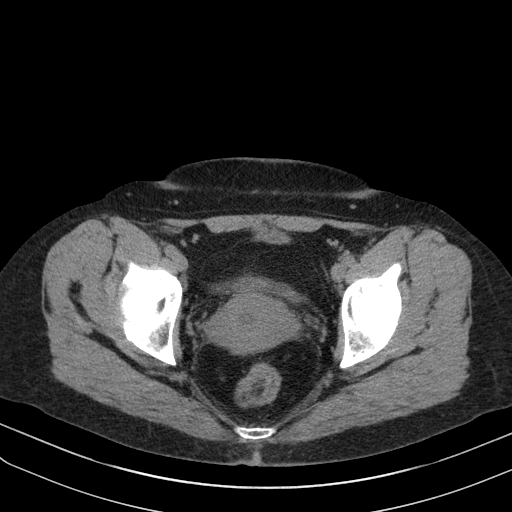
[im 51/88  soft-tissue]
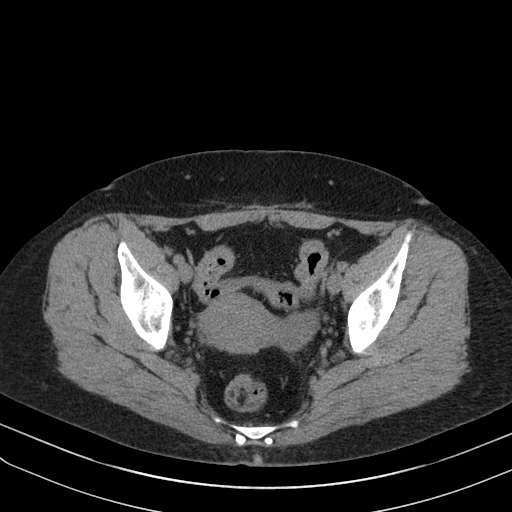
[im 57/88  soft-tissue]
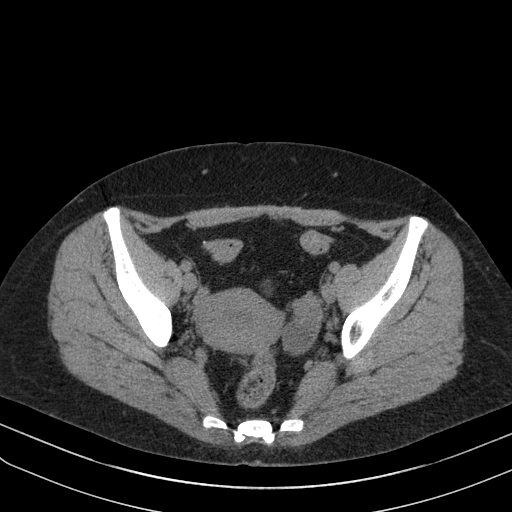
[im 57/88  bone]
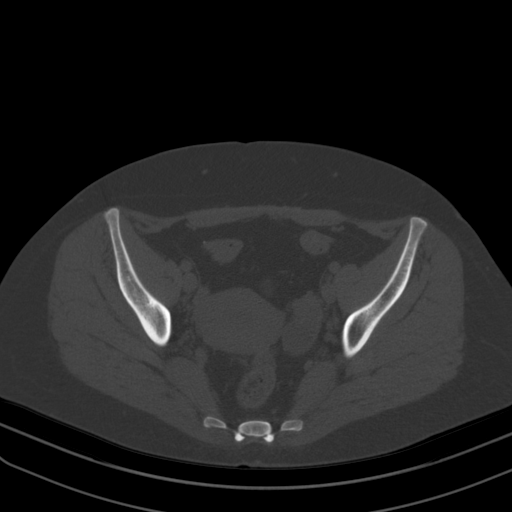
[im 62/88  soft-tissue]
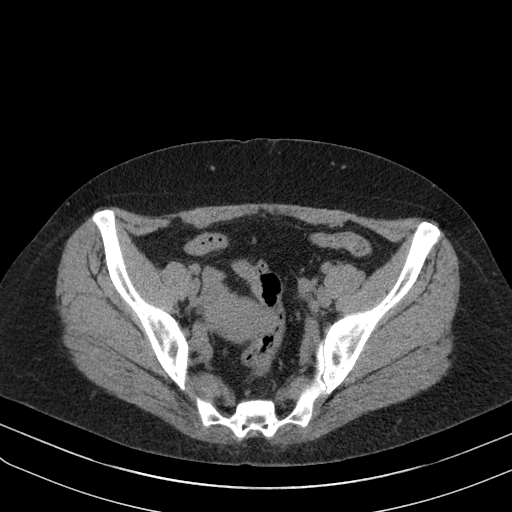
[im 71/88  soft-tissue]
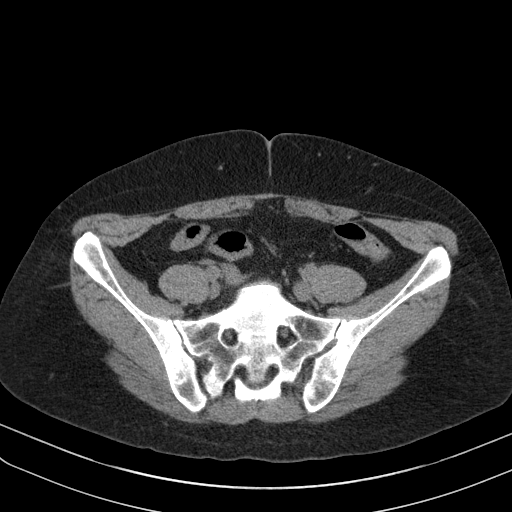
[im 76/88  soft-tissue]
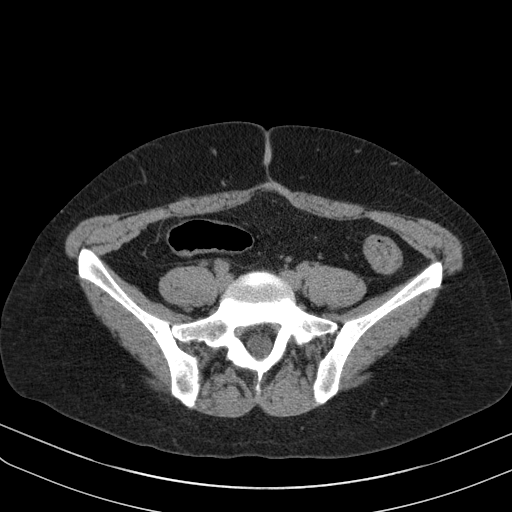
[im 76/88  lung]
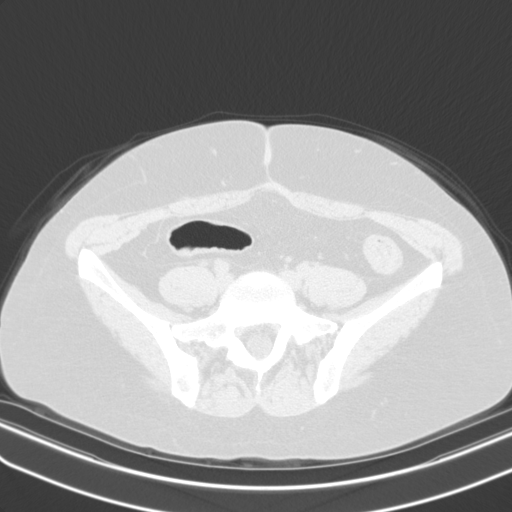
[im 79/88  lung]
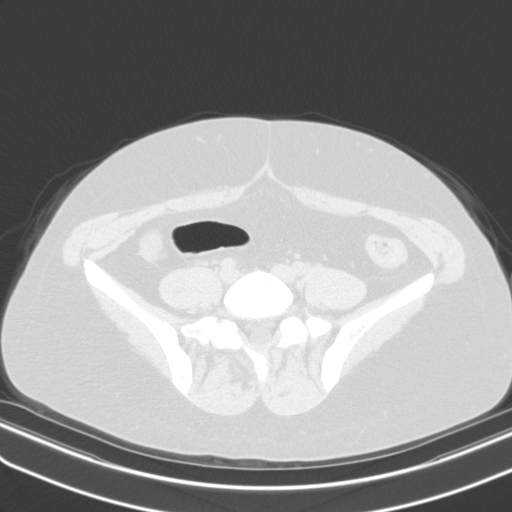
[im 82/88  soft-tissue]
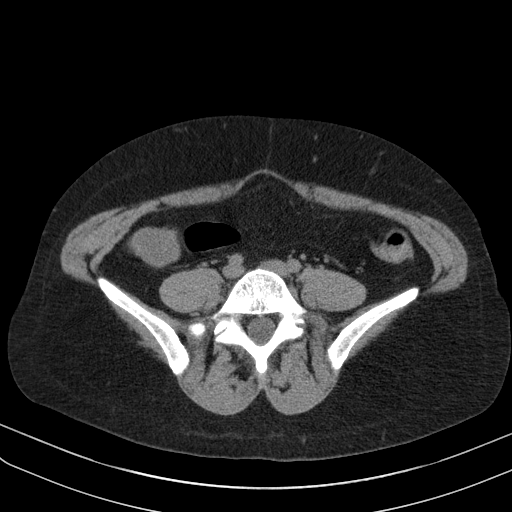
[im 82/88  lung]
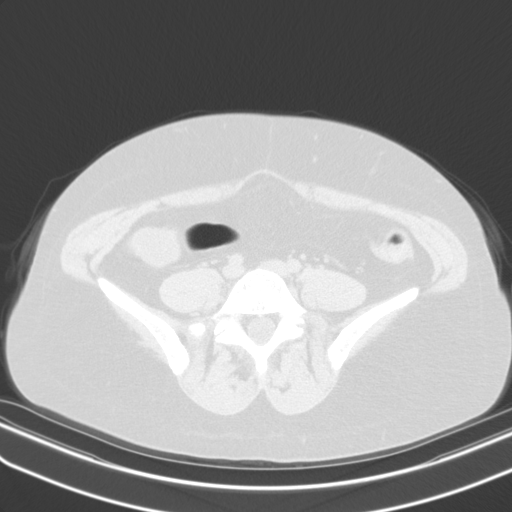
[im 85/88  lung]
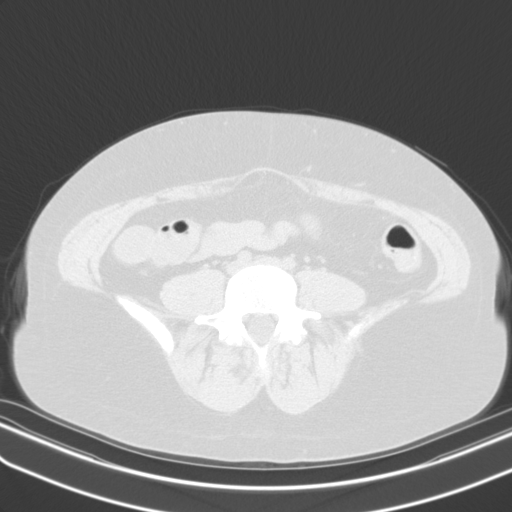

[15 of 32 positions shown; findings below may reference images not displayed]

FINDINGS: Bones/Joint/Cartilage

No acute fracture or dislocation. Healed basicervical right femoral
neck fracture status post ORIF with three partially threaded screws.
No evidence of hardware failure or loosening. Joint spaces are
preserved. No joint effusion.

Ligaments

Ligaments are suboptimally evaluated by CT.

Muscles and Tendons
Grossly intact.  No muscle atrophy.

Soft tissue
No fluid collection or hematoma. No soft tissue mass. 2.3 cm left
and 2.1 cm right simple appearing ovarian cysts.
IMPRESSION: 1. No acute osseous abnormality or significant degenerative changes
of the bilateral hips.
2. Healed basicervical right femoral neck fracture status post ORIF.
No evidence of hardware complication.
3. Small bilateral simple appearing ovarian cysts. No follow-up
imaging recommended. Note: This recommendation does not apply to
premenarchal patients and to those with increased risk (genetic,
family history, elevated tumor markers or other high-risk factors)
of ovarian cancer. Reference: JACR [DATE]):[id number]

## 2020-09-01 IMAGING — CT CT HIP*R* W/O CM
1 series · 15 of 32 positions shown, 19 images · non-contrast
Comparison: Right hip x-rays dated [DATE]. CT right hip
dated [DATE]. CT abdomen pelvis dated [DATE].

CLINICAL DATA: Bilateral hip pain. History prior right femoral neck
fracture status post ORIF.

EXAM:
CT OF THE RIGHT HIP WITHOUT CONTRAST
CT OF THE LEFT HIP WITHOUT CONTRAST
TECHNIQUE: Multidetector CT imaging of the right and left hips was performed
according to the standard protocol. Multiplanar CT image
reconstructions were also generated.

[Series 3: soft tissue pelvis/hip · axial · 0.68mm/px · z∈[-473,-236]mm · 15 of 88 slices shown, 19 images]
[im 6/88  soft-tissue]
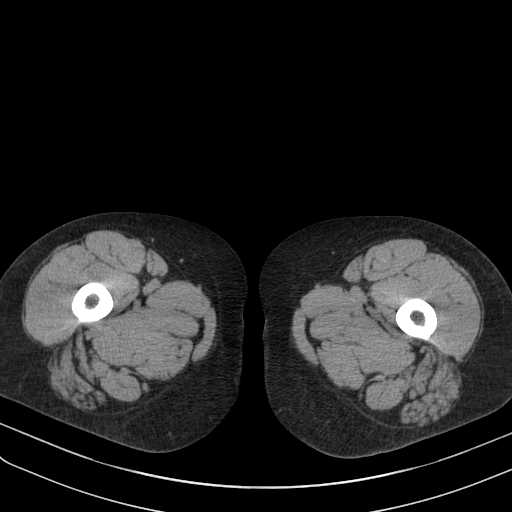
[im 6/88  bone]
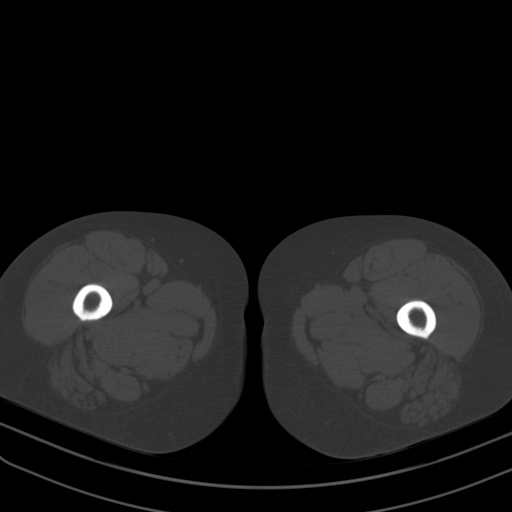
[im 12/88  soft-tissue]
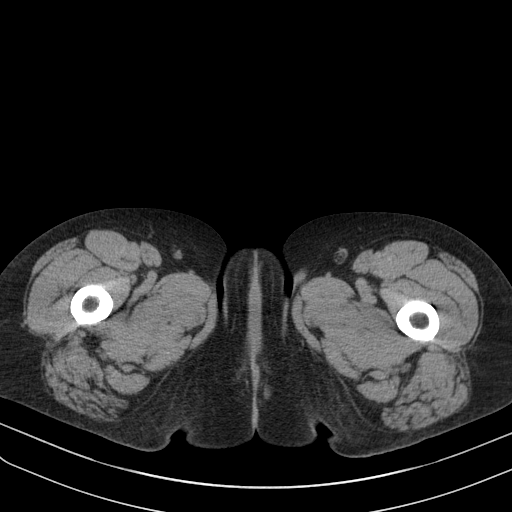
[im 17/88  soft-tissue]
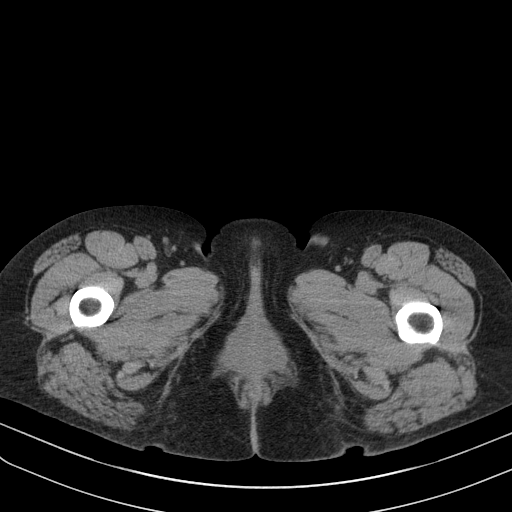
[im 26/88  soft-tissue]
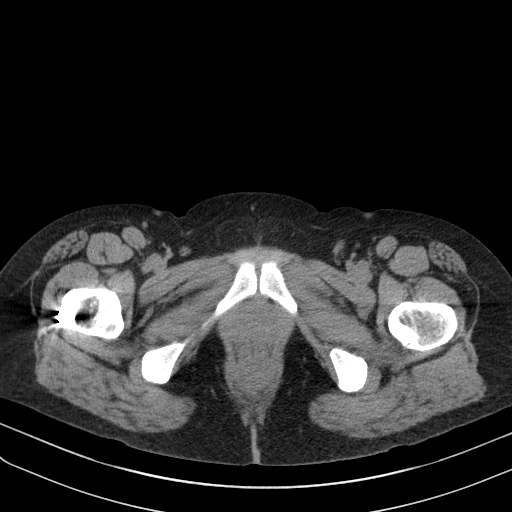
[im 31/88  soft-tissue]
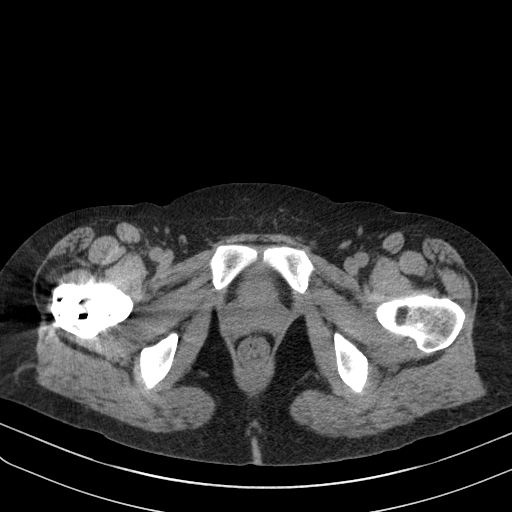
[im 37/88  soft-tissue]
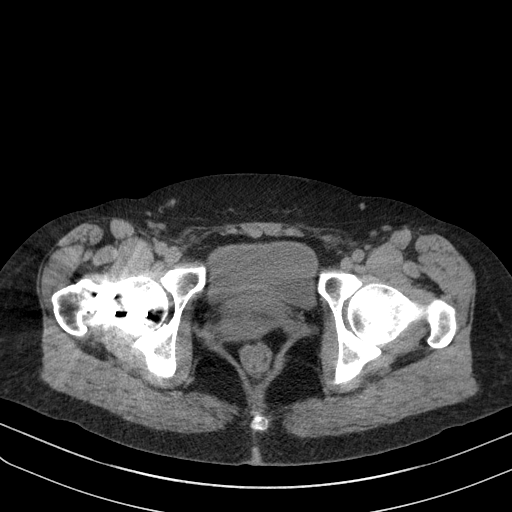
[im 45/88  soft-tissue]
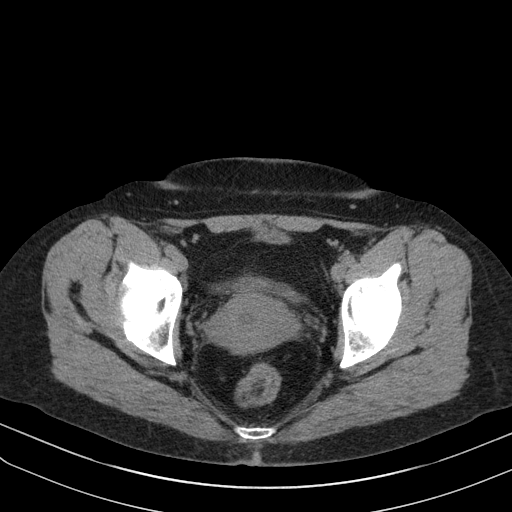
[im 51/88  soft-tissue]
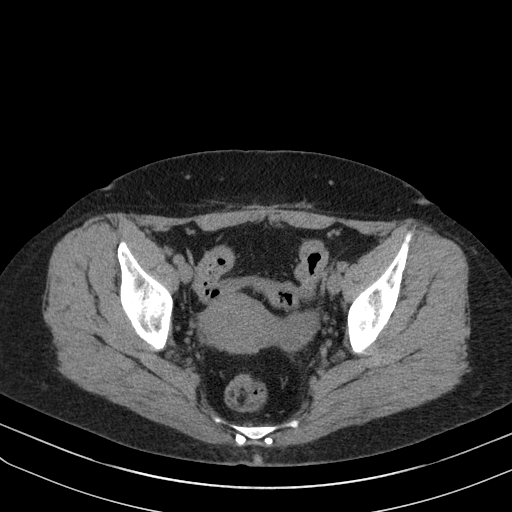
[im 57/88  soft-tissue]
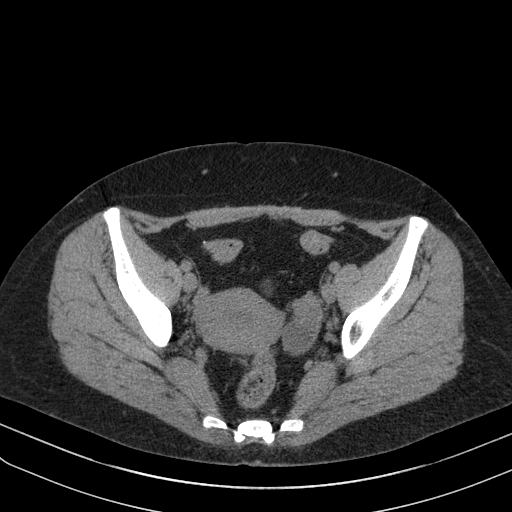
[im 57/88  bone]
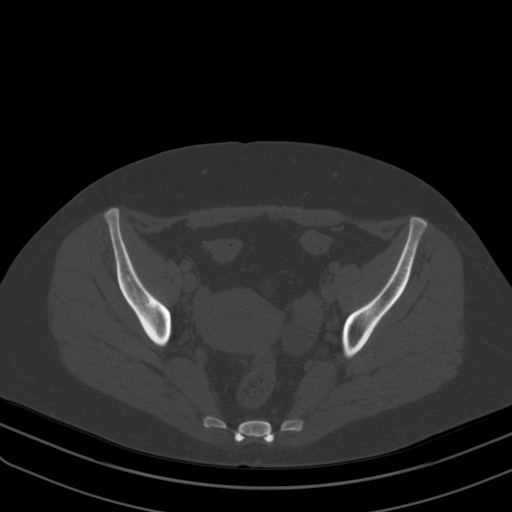
[im 62/88  soft-tissue]
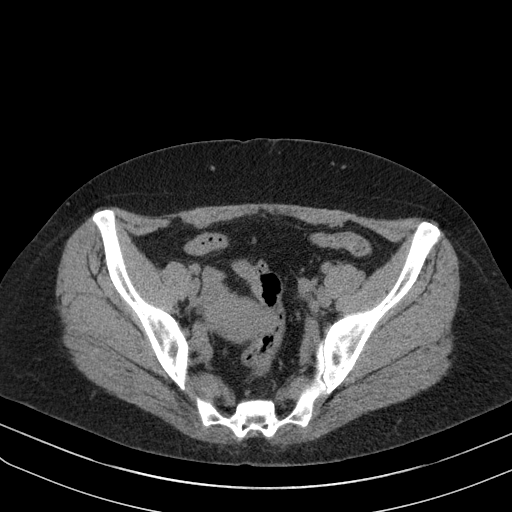
[im 71/88  soft-tissue]
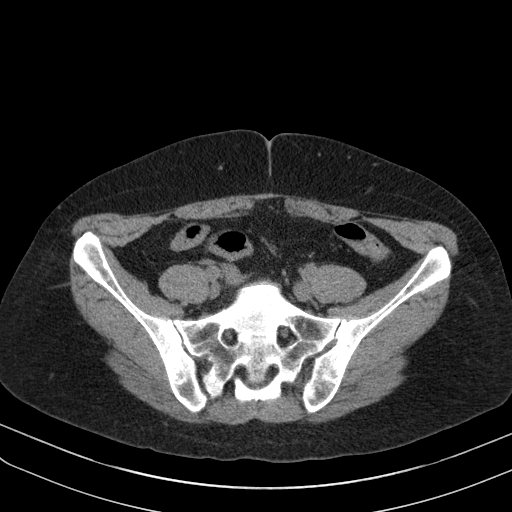
[im 76/88  soft-tissue]
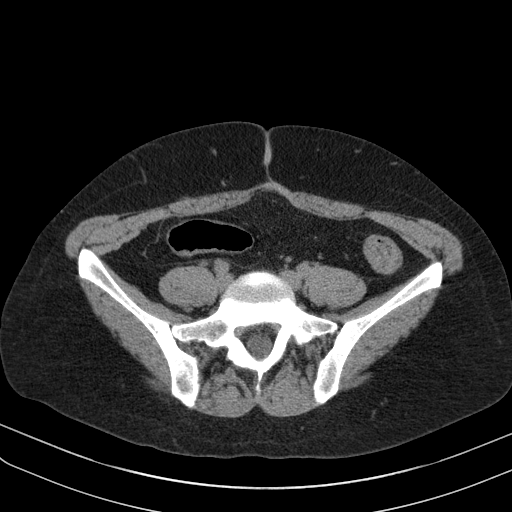
[im 76/88  lung]
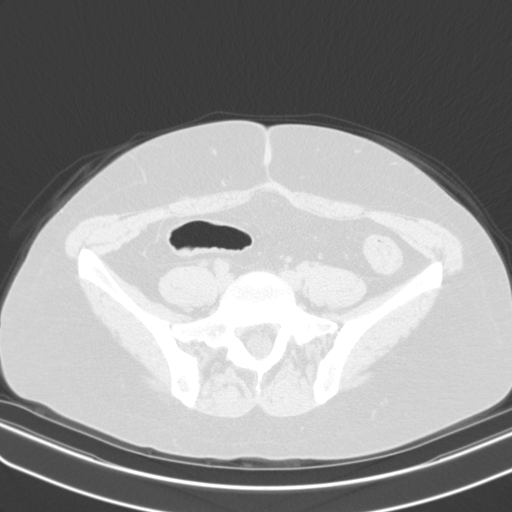
[im 79/88  lung]
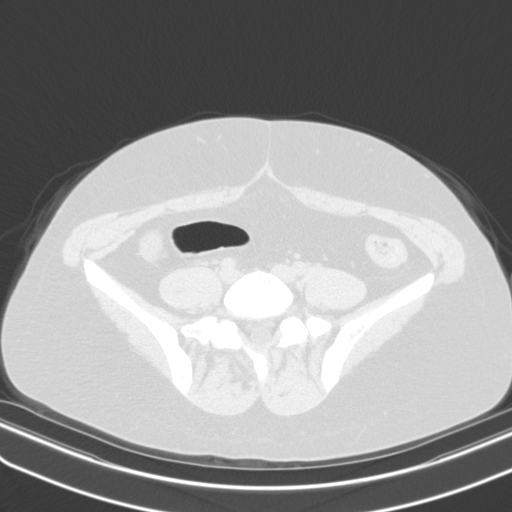
[im 82/88  soft-tissue]
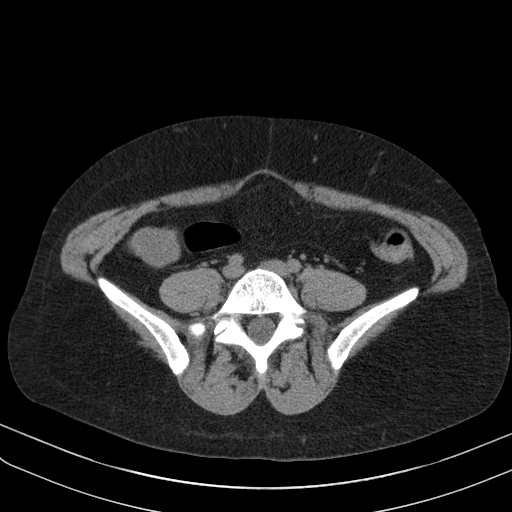
[im 82/88  lung]
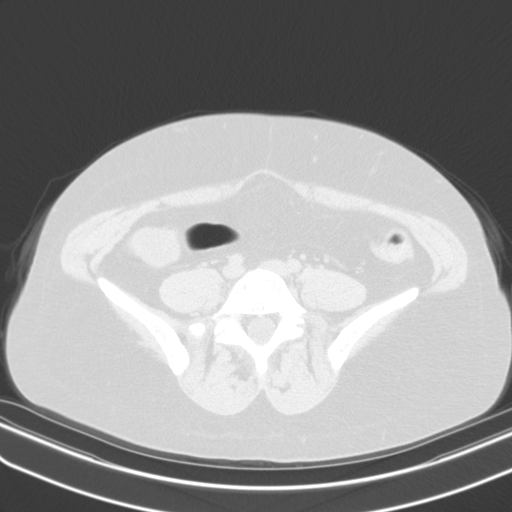
[im 85/88  lung]
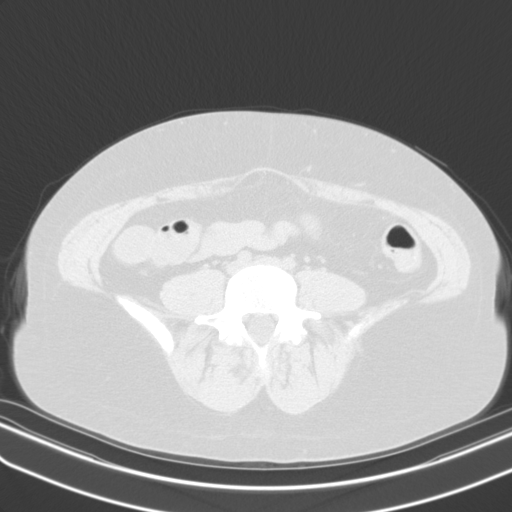

[15 of 32 positions shown; findings below may reference images not displayed]

FINDINGS: Bones/Joint/Cartilage

No acute fracture or dislocation. Healed basicervical right femoral
neck fracture status post ORIF with three partially threaded screws.
No evidence of hardware failure or loosening. Joint spaces are
preserved. No joint effusion.

Ligaments

Ligaments are suboptimally evaluated by CT.

Muscles and Tendons
Grossly intact.  No muscle atrophy.

Soft tissue
No fluid collection or hematoma. No soft tissue mass. 2.3 cm left
and 2.1 cm right simple appearing ovarian cysts.
IMPRESSION: 1. No acute osseous abnormality or significant degenerative changes
of the bilateral hips.
2. Healed basicervical right femoral neck fracture status post ORIF.
No evidence of hardware complication.
3. Small bilateral simple appearing ovarian cysts. No follow-up
imaging recommended. Note: This recommendation does not apply to
premenarchal patients and to those with increased risk (genetic,
family history, elevated tumor markers or other high-risk factors)
of ovarian cancer. Reference: JACR [DATE]):[id number]

## 2020-09-04 ENCOUNTER — Ambulatory Visit: Admit: 2020-09-04 | Discharge: 2020-09-05 | Payer: MEDICAID

## 2020-09-04 DIAGNOSIS — M5412 Radiculopathy, cervical region: Principal | ICD-10-CM

## 2020-09-04 DIAGNOSIS — M545 Low back pain, unspecified: Principal | ICD-10-CM

## 2020-09-04 DIAGNOSIS — G8929 Other chronic pain: Principal | ICD-10-CM

## 2020-09-05 MED ORDER — AZATHIOPRINE 50 MG TABLET
ORAL_TABLET | 1 refills | 0 days
Start: 2020-09-05 — End: ?

## 2020-09-08 MED ORDER — BD LUER-LOK SYRINGE 3 ML 25 X 5/8"
2 refills | 0.00000 days | Status: CN
Start: 2020-09-08 — End: ?

## 2020-09-09 MED ORDER — BD LUER-LOK SYRINGE 3 ML 25 X 5/8"
2 refills | 0.00000 days | Status: CP
Start: 2020-09-09 — End: ?
  Filled 2020-09-10: qty 12, 84d supply, fill #0

## 2020-09-09 MED ORDER — FERROUS SULFATE 325 MG (65 MG IRON) TABLET,DELAYED RELEASE
ORAL_TABLET | Freq: Every day | ORAL | 5 refills | 30.00000 days | Status: CP
Start: 2020-09-09 — End: ?

## 2020-09-14 ENCOUNTER — Telehealth: Admit: 2020-09-14 | Discharge: 2020-09-15 | Payer: MEDICAID | Attending: Family | Primary: Family

## 2020-09-14 DIAGNOSIS — F411 Generalized anxiety disorder: Principal | ICD-10-CM

## 2020-09-14 DIAGNOSIS — F09 Unspecified mental disorder due to known physiological condition: Principal | ICD-10-CM

## 2020-09-14 DIAGNOSIS — F4312 Post-traumatic stress disorder, chronic: Principal | ICD-10-CM

## 2020-09-14 DIAGNOSIS — F900 Attention-deficit hyperactivity disorder, predominantly inattentive type: Principal | ICD-10-CM

## 2020-09-14 MED ORDER — LAMOTRIGINE 100 MG TABLET
ORAL_TABLET | ORAL | 0 refills | 20.00000 days | Status: CP
Start: 2020-09-14 — End: 2020-10-05
  Filled 2020-09-15: qty 15, 20d supply, fill #0

## 2020-09-14 MED ORDER — ONDANSETRON 8 MG DISINTEGRATING TABLET
ORAL_TABLET | Freq: Three times a day (TID) | ORAL | 0 refills | 20 days | Status: CP | PRN
Start: 2020-09-14 — End: ?
  Filled 2020-09-15: qty 60, 20d supply, fill #0

## 2020-09-14 MED ORDER — DIPHENOXYLATE-ATROPINE 2.5 MG-0.025 MG TABLET
ORAL_TABLET | Freq: Three times a day (TID) | ORAL | 1 refills | 30.00000 days | Status: CP | PRN
Start: 2020-09-14 — End: 2020-09-23
  Filled 2020-09-15: qty 180, 30d supply, fill #0

## 2020-09-21 DIAGNOSIS — M5412 Radiculopathy, cervical region: Principal | ICD-10-CM

## 2020-09-22 DIAGNOSIS — M5412 Radiculopathy, cervical region: Principal | ICD-10-CM

## 2020-09-23 ENCOUNTER — Ambulatory Visit: Admit: 2020-09-23 | Discharge: 2020-09-24 | Payer: MEDICAID

## 2020-09-23 ENCOUNTER — Ambulatory Visit: Admit: 2020-09-23 | Discharge: 2020-09-23 | Payer: MEDICAID

## 2020-09-27 MED ORDER — DEXTROAMPHETAMINE-AMPHETAMINE 20 MG TABLET
ORAL_TABLET | Freq: Two times a day (BID) | ORAL | 0 refills | 30 days | Status: CP
Start: 2020-09-27 — End: 2020-10-27

## 2020-10-01 MED ORDER — ALPRAZOLAM 0.5 MG TABLET
ORAL_TABLET | Freq: Three times a day (TID) | ORAL | 0 refills | 30 days | Status: CP | PRN
Start: 2020-10-01 — End: ?

## 2020-10-06 MED ORDER — ERGOCALCIFEROL (VITAMIN D2) 1,250 MCG (50,000 UNIT) CAPSULE
ORAL_CAPSULE | 1 refills | 0.00000 days
Start: 2020-10-06 — End: ?

## 2020-10-06 MED ORDER — LAMOTRIGINE 150 MG TABLET
ORAL_TABLET | 0 refills | 0 days
Start: 2020-10-06 — End: ?

## 2020-10-07 ENCOUNTER — Encounter: Admit: 2020-10-07 | Discharge: 2020-10-08 | Payer: MEDICAID | Attending: Pediatrics | Primary: Pediatrics

## 2020-10-07 ENCOUNTER — Ambulatory Visit: Admit: 2020-10-07 | Discharge: 2020-10-08 | Payer: MEDICAID

## 2020-10-07 MED ORDER — ERGOCALCIFEROL (VITAMIN D2) 1,250 MCG (50,000 UNIT) CAPSULE
ORAL_CAPSULE | ORAL | 1 refills | 84 days | Status: CP
Start: 2020-10-07 — End: ?

## 2020-10-07 MED ORDER — HYDROCORTISONE ACETATE 25 MG RECTAL SUPPOSITORY
Freq: Every evening | RECTAL | 0 refills | 30 days | Status: CP
Start: 2020-10-07 — End: 2020-11-06

## 2020-10-07 MED FILL — ESOMEPRAZOLE MAGNESIUM 40 MG CAPSULE,DELAYED RELEASE: ORAL | 30 days supply | Qty: 60 | Fill #1

## 2020-10-09 ENCOUNTER — Institutional Professional Consult (permissible substitution): Admit: 2020-10-09 | Discharge: 2020-10-10 | Payer: MEDICAID

## 2020-10-09 MED ORDER — HYDROCORTISONE ACETATE 25 MG RECTAL SUPPOSITORY
Freq: Every evening | RECTAL | 0 refills | 30.00000 days | Status: CP
Start: 2020-10-09 — End: 2020-11-08

## 2020-10-13 ENCOUNTER — Telehealth
Admit: 2020-10-13 | Discharge: 2020-10-14 | Payer: MEDICAID | Attending: Student in an Organized Health Care Education/Training Program | Primary: Student in an Organized Health Care Education/Training Program

## 2020-10-20 MED FILL — MAGNESIUM OXIDE 400 MG (241.3 MG MAGNESIUM) TABLET: ORAL | 30 days supply | Qty: 60 | Fill #3

## 2020-10-20 MED FILL — POTASSIUM CHLORIDE ER 20 MEQ TABLET,EXTENDED RELEASE(PART/CRYST): ORAL | 30 days supply | Qty: 30 | Fill #1

## 2020-10-21 MED ORDER — DEXTROAMPHETAMINE-AMPHETAMINE 20 MG TABLET
ORAL_TABLET | Freq: Two times a day (BID) | ORAL | 0 refills | 30.00000 days | Status: CP
Start: 2020-10-21 — End: 2020-11-20

## 2020-10-21 MED ORDER — ALPRAZOLAM 0.5 MG TABLET
ORAL_TABLET | Freq: Three times a day (TID) | ORAL | 0 refills | 30.00000 days | Status: CP | PRN
Start: 2020-10-21 — End: ?

## 2020-10-21 MED ORDER — AZATHIOPRINE 50 MG TABLET
ORAL_TABLET | Freq: Every day | ORAL | 0 refills | 90 days | Status: CP
Start: 2020-10-21 — End: ?

## 2020-10-21 MED FILL — COLESTIPOL 1 GRAM TABLET: ORAL | 30 days supply | Qty: 120 | Fill #1

## 2020-10-23 DIAGNOSIS — Z8781 Personal history of (healed) traumatic fracture: Principal | ICD-10-CM

## 2020-10-23 DIAGNOSIS — M542 Cervicalgia: Principal | ICD-10-CM

## 2020-10-23 DIAGNOSIS — G8929 Other chronic pain: Principal | ICD-10-CM

## 2020-10-23 DIAGNOSIS — M545 Chronic low back pain, unspecified back pain laterality, unspecified whether sciatica present: Principal | ICD-10-CM

## 2020-10-23 DIAGNOSIS — M792 Neuralgia and neuritis, unspecified: Principal | ICD-10-CM

## 2020-10-23 DIAGNOSIS — M549 Dorsalgia, unspecified: Principal | ICD-10-CM

## 2020-10-23 MED ORDER — DEXTROAMPHETAMINE-AMPHETAMINE 20 MG TABLET
ORAL_TABLET | Freq: Two times a day (BID) | ORAL | 0 refills | 30 days | Status: CP
Start: 2020-10-23 — End: 2020-11-22

## 2020-10-23 MED ORDER — ALPRAZOLAM 0.5 MG TABLET
ORAL_TABLET | Freq: Three times a day (TID) | ORAL | 0 refills | 30.00000 days | Status: CP | PRN
Start: 2020-10-23 — End: ?

## 2020-10-24 ENCOUNTER — Ambulatory Visit: Admit: 2020-10-24 | Discharge: 2020-10-24 | Payer: MEDICAID

## 2020-10-31 MED ORDER — CYANOCOBALAMIN (VIT B-12) 1,000 MCG/ML INJECTION SOLUTION
SUBCUTANEOUS | 3 refills | 84.00000 days
Start: 2020-10-31 — End: 2021-10-31

## 2020-11-10 ENCOUNTER — Telehealth
Admit: 2020-11-10 | Discharge: 2020-11-11 | Payer: MEDICAID | Attending: Student in an Organized Health Care Education/Training Program | Primary: Student in an Organized Health Care Education/Training Program

## 2020-11-10 MED ORDER — DULOXETINE 30 MG CAPSULE,DELAYED RELEASE: 3 refills | 0 days | Status: CP

## 2020-11-10 MED ORDER — DULOXETINE 30 MG CAPSULE,DELAYED RELEASE
ORAL_CAPSULE | ORAL | 3 refills | 33.00000 days | Status: CP
Start: 2020-11-10 — End: 2020-12-17

## 2020-11-13 ENCOUNTER — Encounter: Admit: 2020-11-13 | Discharge: 2020-11-14 | Payer: MEDICAID | Attending: Registered Nurse | Primary: Registered Nurse

## 2020-11-13 ENCOUNTER — Ambulatory Visit: Admit: 2020-11-13 | Discharge: 2020-11-14 | Payer: MEDICAID

## 2020-11-13 DIAGNOSIS — K610 Anal abscess: Principal | ICD-10-CM

## 2020-11-13 DIAGNOSIS — K50818 Crohn's disease of both small and large intestine with other complication: Principal | ICD-10-CM

## 2020-11-13 MED ORDER — CIPROFLOXACIN 250 MG TABLET
ORAL_TABLET | Freq: Two times a day (BID) | ORAL | 0 refills | 0 days
Start: 2020-11-13 — End: ?

## 2020-11-16 ENCOUNTER — Telehealth: Admit: 2020-11-16 | Discharge: 2020-11-17 | Payer: MEDICAID | Attending: Clinical | Primary: Clinical

## 2020-11-17 MED ORDER — CIPROFLOXACIN 250 MG TABLET
ORAL_TABLET | Freq: Two times a day (BID) | ORAL | 0 refills | 30.00000 days | Status: CP
Start: 2020-11-17 — End: 2020-12-17

## 2020-11-17 MED ORDER — METRONIDAZOLE 250 MG TABLET
ORAL_TABLET | Freq: Two times a day (BID) | ORAL | 0 refills | 30 days | Status: CP
Start: 2020-11-17 — End: ?

## 2020-11-22 MED ORDER — DEXTROAMPHETAMINE-AMPHETAMINE 20 MG TABLET
ORAL_TABLET | Freq: Two times a day (BID) | ORAL | 0 refills | 30 days
Start: 2020-11-22 — End: 2020-12-22

## 2020-11-23 MED ORDER — DEXTROAMPHETAMINE-AMPHETAMINE 20 MG TABLET
ORAL_TABLET | Freq: Two times a day (BID) | ORAL | 0 refills | 30.00000 days | Status: CP
Start: 2020-11-23 — End: 2020-12-23

## 2020-11-23 MED ORDER — ALPRAZOLAM 0.5 MG TABLET
ORAL_TABLET | Freq: Three times a day (TID) | ORAL | 0 refills | 30 days | Status: CP | PRN
Start: 2020-11-23 — End: ?

## 2020-11-24 DIAGNOSIS — K50818 Crohn's disease of both small and large intestine with other complication: Principal | ICD-10-CM

## 2020-11-24 DIAGNOSIS — K610 Anal abscess: Principal | ICD-10-CM

## 2020-11-24 MED ORDER — CIPROFLOXACIN 250 MG TABLET
ORAL_TABLET | Freq: Two times a day (BID) | ORAL | 0 refills | 30 days | Status: CP
Start: 2020-11-24 — End: 2020-12-24
  Filled 2020-11-26: qty 60, 30d supply, fill #0

## 2020-11-24 MED ORDER — ONDANSETRON 8 MG DISINTEGRATING TABLET
ORAL_TABLET | Freq: Three times a day (TID) | ORAL | 2 refills | 20 days | Status: CP | PRN
Start: 2020-11-24 — End: ?
  Filled 2020-11-26: qty 60, 20d supply, fill #0

## 2020-11-26 MED FILL — ESOMEPRAZOLE MAGNESIUM 40 MG CAPSULE,DELAYED RELEASE: ORAL | 30 days supply | Qty: 60 | Fill #2

## 2020-11-26 MED FILL — MAGNESIUM OXIDE 400 MG (241.3 MG MAGNESIUM) TABLET: ORAL | 30 days supply | Qty: 60 | Fill #4

## 2020-11-26 MED FILL — POTASSIUM CHLORIDE ER 20 MEQ TABLET,EXTENDED RELEASE(PART/CRYST): ORAL | 30 days supply | Qty: 30 | Fill #2

## 2020-11-26 MED FILL — FAMOTIDINE 40 MG TABLET: ORAL | 90 days supply | Qty: 90 | Fill #1

## 2020-12-08 ENCOUNTER — Telehealth
Admit: 2020-12-08 | Discharge: 2020-12-09 | Payer: MEDICAID | Attending: Student in an Organized Health Care Education/Training Program | Primary: Student in an Organized Health Care Education/Training Program

## 2020-12-10 ENCOUNTER — Ambulatory Visit: Admit: 2020-12-10 | Discharge: 2020-12-11 | Payer: MEDICAID

## 2020-12-10 DIAGNOSIS — M542 Cervicalgia: Principal | ICD-10-CM

## 2020-12-10 DIAGNOSIS — M545 Chronic low back pain, unspecified back pain laterality, unspecified whether sciatica present: Principal | ICD-10-CM

## 2020-12-10 DIAGNOSIS — G8929 Other chronic pain: Principal | ICD-10-CM

## 2020-12-10 DIAGNOSIS — M792 Neuralgia and neuritis, unspecified: Principal | ICD-10-CM

## 2020-12-10 DIAGNOSIS — M549 Dorsalgia, unspecified: Principal | ICD-10-CM

## 2020-12-10 DIAGNOSIS — Z8781 Personal history of (healed) traumatic fracture: Principal | ICD-10-CM

## 2020-12-14 ENCOUNTER — Telehealth: Admit: 2020-12-14 | Discharge: 2020-12-15 | Payer: MEDICAID | Attending: Clinical | Primary: Clinical

## 2020-12-14 DIAGNOSIS — F411 Generalized anxiety disorder: Principal | ICD-10-CM

## 2020-12-14 DIAGNOSIS — F4310 Post-traumatic stress disorder, unspecified: Principal | ICD-10-CM

## 2020-12-16 MED ORDER — HYDROCORTISONE-PRAMOXINE 2.5 %-1 % RECTAL CREAM
Freq: Two times a day (BID) | RECTAL | 0 refills | 0.00000 days | Status: CP
Start: 2020-12-16 — End: ?

## 2020-12-21 MED ORDER — HYDROCORTISONE-PRAMOXINE 2.5 %-1 % RECTAL CREAM
Freq: Two times a day (BID) | RECTAL | 0 refills | 0 days | Status: CP
Start: 2020-12-21 — End: ?

## 2020-12-21 MED ORDER — DEXTROAMPHETAMINE-AMPHETAMINE 20 MG TABLET
ORAL_TABLET | Freq: Two times a day (BID) | ORAL | 0 refills | 30.00000 days | Status: CP
Start: 2020-12-21 — End: 2021-01-20

## 2020-12-21 MED ORDER — FAMOTIDINE 40 MG TABLET
ORAL_TABLET | Freq: Two times a day (BID) | ORAL | 1 refills | 90 days | Status: CP
Start: 2020-12-21 — End: 2021-12-21
  Filled 2021-02-19: qty 90, 90d supply, fill #0

## 2020-12-22 ENCOUNTER — Telehealth: Admit: 2020-12-22 | Discharge: 2020-12-23 | Payer: MEDICAID | Attending: Clinical | Primary: Clinical

## 2020-12-22 DIAGNOSIS — F411 Generalized anxiety disorder: Principal | ICD-10-CM

## 2020-12-23 ENCOUNTER — Ambulatory Visit: Admit: 2020-12-23 | Discharge: 2020-12-24 | Payer: MEDICAID

## 2020-12-23 DIAGNOSIS — G8929 Other chronic pain: Principal | ICD-10-CM

## 2020-12-23 DIAGNOSIS — M545 Chronic low back pain, unspecified back pain laterality, unspecified whether sciatica present: Principal | ICD-10-CM

## 2020-12-23 DIAGNOSIS — M792 Neuralgia and neuritis, unspecified: Principal | ICD-10-CM

## 2020-12-23 DIAGNOSIS — M549 Dorsalgia, unspecified: Principal | ICD-10-CM

## 2020-12-23 DIAGNOSIS — Z8781 Personal history of (healed) traumatic fracture: Principal | ICD-10-CM

## 2020-12-23 DIAGNOSIS — M542 Cervicalgia: Principal | ICD-10-CM

## 2020-12-24 MED FILL — ESOMEPRAZOLE MAGNESIUM 40 MG CAPSULE,DELAYED RELEASE: ORAL | 30 days supply | Qty: 60 | Fill #3

## 2020-12-24 MED FILL — COLESTIPOL 1 GRAM TABLET: ORAL | 30 days supply | Qty: 120 | Fill #2

## 2020-12-24 MED FILL — ONDANSETRON 8 MG DISINTEGRATING TABLET: ORAL | 20 days supply | Qty: 60 | Fill #1

## 2020-12-28 ENCOUNTER — Telehealth: Admit: 2020-12-28 | Discharge: 2020-12-29 | Payer: MEDICAID | Attending: Clinical | Primary: Clinical

## 2020-12-30 MED ORDER — DEXTROAMPHETAMINE-AMPHETAMINE 20 MG TABLET
ORAL_TABLET | Freq: Two times a day (BID) | ORAL | 0 refills | 30.00000 days
Start: 2020-12-30 — End: 2021-01-29

## 2021-01-05 ENCOUNTER — Telehealth: Admit: 2021-01-05 | Discharge: 2021-01-06 | Payer: MEDICAID | Attending: Clinical | Primary: Clinical

## 2021-01-09 MED ORDER — ERGOCALCIFEROL (VITAMIN D2) 1,250 MCG (50,000 UNIT) CAPSULE
ORAL_CAPSULE | 1 refills | 0 days
Start: 2021-01-09 — End: ?

## 2021-01-11 ENCOUNTER — Telehealth: Admit: 2021-01-11 | Discharge: 2021-01-12 | Payer: MEDICAID | Attending: Clinical | Primary: Clinical

## 2021-01-11 DIAGNOSIS — F4310 Post-traumatic stress disorder, unspecified: Principal | ICD-10-CM

## 2021-01-11 DIAGNOSIS — F411 Generalized anxiety disorder: Principal | ICD-10-CM

## 2021-01-11 MED ORDER — ERGOCALCIFEROL (VITAMIN D2) 1,250 MCG (50,000 UNIT) CAPSULE
ORAL_CAPSULE | 1 refills | 0 days | Status: CP
Start: 2021-01-11 — End: ?

## 2021-01-14 ENCOUNTER — Encounter: Payer: Self-pay | Admitting: Neurology

## 2021-01-14 ENCOUNTER — Ambulatory Visit: Payer: Medicaid Other | Admitting: Neurology

## 2021-01-14 VITALS — BP 138/80 | HR 99 | Ht 62.0 in | Wt 141.2 lb

## 2021-01-14 DIAGNOSIS — M791 Myalgia, unspecified site: Secondary | ICD-10-CM

## 2021-01-14 MED ORDER — GABAPENTIN 800 MG PO TABS: 800.0000 mg | ORAL_TABLET | Freq: Four times a day (QID) | ORAL | 3 refills | Status: DC
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
## 2021-01-15 ENCOUNTER — Other Ambulatory Visit: Payer: Self-pay | Admitting: Neurology

## 2021-01-15 MED ORDER — PREGABALIN 200 MG PO CAPS: 200.0000 mg | ORAL_CAPSULE | Freq: Three times a day (TID) | ORAL | 1 refills | Status: DC

## 2021-01-16 DIAGNOSIS — K50818 Crohn's disease of both small and large intestine with other complication: Principal | ICD-10-CM

## 2021-01-16 DIAGNOSIS — K610 Anal abscess: Principal | ICD-10-CM

## 2021-01-16 LAB — HIV ANTIBODY (ROUTINE TESTING W REFLEX): HIV Screen 4th Generation wRfx: NONREACTIVE

## 2021-01-16 LAB — GLIADIN ANTIBODIES, SERUM
Antigliadin Abs, IgA: 5 units (ref 0–19)
Gliadin IgG: 1 units (ref 0–19)

## 2021-01-16 LAB — RHEUMATOID FACTOR: Rheumatoid fact SerPl-aCnc: <10 IU/mL (ref <14.0)

## 2021-01-16 LAB — GLUTAMIC ACID DECARBOXYLASE AUTO ABS: Glutamic Acid Decarb Ab: <5 U/mL (ref 0.0–5.0)

## 2021-01-16 LAB — CYCLIC CITRUL PEPTIDE ANTIBODY, IGG/IGA: Cyclic Citrullin Peptide Ab: 3 units (ref 0–19)

## 2021-01-16 MED ORDER — ALPRAZOLAM 0.5 MG TABLET
ORAL_TABLET | Freq: Three times a day (TID) | ORAL | 0 refills | 30 days | PRN
Start: 2021-01-16 — End: ?

## 2021-01-16 MED ORDER — DEXTROAMPHETAMINE-AMPHETAMINE 20 MG TABLET
ORAL_TABLET | Freq: Two times a day (BID) | ORAL | 0 refills | 30 days
Start: 2021-01-16 — End: 2021-02-15

## 2021-01-18 MED ORDER — METRONIDAZOLE 250 MG TABLET
ORAL_TABLET | Freq: Two times a day (BID) | ORAL | 0 refills | 30 days | Status: CP
Start: 2021-01-18 — End: ?

## 2021-01-18 MED ORDER — DEXTROAMPHETAMINE-AMPHETAMINE 20 MG TABLET
ORAL_TABLET | Freq: Two times a day (BID) | ORAL | 0 refills | 30.00000 days | Status: CP
Start: 2021-01-18 — End: 2021-02-17

## 2021-01-18 MED ORDER — ALPRAZOLAM 0.5 MG TABLET
ORAL_TABLET | Freq: Three times a day (TID) | ORAL | 0 refills | 30.00000 days | Status: CP | PRN
Start: 2021-01-18 — End: ?

## 2021-01-19 ENCOUNTER — Telehealth
Admit: 2021-01-19 | Discharge: 2021-01-20 | Payer: MEDICAID | Attending: Student in an Organized Health Care Education/Training Program | Primary: Student in an Organized Health Care Education/Training Program

## 2021-01-19 MED ORDER — ALPRAZOLAM 1 MG TABLET
ORAL_TABLET | Freq: Two times a day (BID) | ORAL | 3 refills | 30.00000 days | Status: CP | PRN
Start: 2021-01-19 — End: ?

## 2021-01-19 MED ORDER — DEXTROAMPHETAMINE-AMPHETAMINE 20 MG TABLET
ORAL_TABLET | Freq: Two times a day (BID) | ORAL | 0 refills | 30.00000 days | Status: CP
Start: 2021-01-19 — End: 2021-02-18

## 2021-01-19 MED ORDER — ALPRAZOLAM 0.5 MG TABLET
ORAL_TABLET | Freq: Three times a day (TID) | ORAL | 0 refills | 30 days | Status: CP | PRN
Start: 2021-01-19 — End: 2021-01-19

## 2021-01-20 ENCOUNTER — Telehealth: Admit: 2021-01-20 | Discharge: 2021-01-21 | Payer: MEDICAID | Attending: Clinical | Primary: Clinical

## 2021-01-20 DIAGNOSIS — F4310 Post-traumatic stress disorder, unspecified: Principal | ICD-10-CM

## 2021-01-20 DIAGNOSIS — F411 Generalized anxiety disorder: Principal | ICD-10-CM

## 2021-01-20 DIAGNOSIS — K50018 Crohn's disease of small intestine with other complication: Principal | ICD-10-CM

## 2021-01-20 MED ORDER — CIPROFLOXACIN 250 MG TABLET
ORAL_TABLET | Freq: Two times a day (BID) | ORAL | 0 refills | 30 days | Status: CP
Start: 2021-01-20 — End: ?
  Filled 2021-01-28: qty 60, 30d supply, fill #0

## 2021-01-21 MED FILL — AZATHIOPRINE 50 MG TABLET: ORAL | 30 days supply | Qty: 60 | Fill #0

## 2021-01-21 MED FILL — ONDANSETRON 8 MG DISINTEGRATING TABLET: ORAL | 20 days supply | Qty: 60 | Fill #2

## 2021-01-21 MED FILL — MAGNESIUM OXIDE 400 MG (241.3 MG MAGNESIUM) TABLET: ORAL | 30 days supply | Qty: 60 | Fill #5

## 2021-01-21 MED FILL — ESOMEPRAZOLE MAGNESIUM 40 MG CAPSULE,DELAYED RELEASE: ORAL | 30 days supply | Qty: 60 | Fill #4

## 2021-01-25 DIAGNOSIS — K50818 Crohn's disease of both small and large intestine with other complication: Principal | ICD-10-CM

## 2021-01-25 DIAGNOSIS — K610 Anal abscess: Principal | ICD-10-CM

## 2021-01-25 MED ORDER — METRONIDAZOLE 250 MG TABLET
ORAL_TABLET | 0 refills | 0 days
Start: 2021-01-25 — End: ?

## 2021-01-25 MED ORDER — AZATHIOPRINE 50 MG TABLET
ORAL_TABLET | 0 refills | 0 days
Start: 2021-01-25 — End: ?

## 2021-01-25 MED ORDER — CYANOCOBALAMIN (VIT B-12) 1,000 MCG/ML INJECTION SOLUTION
SUBCUTANEOUS | 3 refills | 0 days
Start: 2021-01-25 — End: ?

## 2021-01-26 MED ORDER — CYANOCOBALAMIN (VIT B-12) 1,000 MCG/ML INJECTION SOLUTION
SUBCUTANEOUS | 3 refills | 84.00000 days | Status: CP
Start: 2021-01-26 — End: 2022-01-26

## 2021-01-31 MED ORDER — CYANOCOBALAMIN (VIT B-12) 1,000 MCG/ML INJECTION SOLUTION
SUBCUTANEOUS | 3 refills | 84.00000 days
Start: 2021-01-31 — End: 2022-01-31

## 2021-01-31 MED ORDER — POTASSIUM CHLORIDE ER 20 MEQ TABLET,EXTENDED RELEASE(PART/CRYST)
ORAL_TABLET | Freq: Every day | ORAL | 2 refills | 30.00000 days
Start: 2021-01-31 — End: ?
  Filled 2021-02-01: qty 30, 30d supply, fill #0

## 2021-02-01 MED ORDER — CYANOCOBALAMIN (VIT B-12) 1,000 MCG/ML INJECTION SOLUTION
SUBCUTANEOUS | 3 refills | 84 days | Status: CP
Start: 2021-02-01 — End: 2022-02-01

## 2021-02-01 MED FILL — BD LUER-LOK SYRINGE 3 ML 25 X 5/8": 84 days supply | Qty: 12 | Fill #1

## 2021-02-03 ENCOUNTER — Telehealth: Admit: 2021-02-03 | Discharge: 2021-02-04 | Payer: MEDICAID | Attending: Clinical | Primary: Clinical

## 2021-02-15 DIAGNOSIS — K50818 Crohn's disease of both small and large intestine with other complication: Principal | ICD-10-CM

## 2021-02-15 DIAGNOSIS — K610 Anal abscess: Principal | ICD-10-CM

## 2021-02-15 DIAGNOSIS — K50018 Crohn's disease of small intestine with other complication: Principal | ICD-10-CM

## 2021-02-15 MED ORDER — ERGOCALCIFEROL (VITAMIN D2) 1,250 MCG (50,000 UNIT) CAPSULE
ORAL_CAPSULE | 1 refills | 0.00000 days
Start: 2021-02-15 — End: ?

## 2021-02-15 MED ORDER — AMMONIUM LACTATE 12 % TOPICAL CREAM
0 days
Start: 2021-02-15 — End: ?

## 2021-02-15 MED ORDER — CIPROFLOXACIN 250 MG TABLET
ORAL_TABLET | Freq: Two times a day (BID) | ORAL | 0 refills | 30 days
Start: 2021-02-15 — End: ?

## 2021-02-15 MED ORDER — ALPRAZOLAM 1 MG TABLET
ORAL_TABLET | Freq: Two times a day (BID) | ORAL | 3 refills | 30 days | PRN
Start: 2021-02-15 — End: ?

## 2021-02-15 MED ORDER — ONDANSETRON 8 MG DISINTEGRATING TABLET
ORAL_TABLET | Freq: Three times a day (TID) | ORAL | 2 refills | 20 days | Status: CP | PRN
Start: 2021-02-15 — End: ?
  Filled 2021-02-19: qty 60, 20d supply, fill #0

## 2021-02-15 MED ORDER — DEXTROAMPHETAMINE-AMPHETAMINE 20 MG TABLET
ORAL_TABLET | Freq: Two times a day (BID) | ORAL | 0 refills | 30 days
Start: 2021-02-15 — End: 2021-03-17

## 2021-02-16 MED ORDER — DEXTROAMPHETAMINE-AMPHETAMINE 20 MG TABLET
ORAL_TABLET | Freq: Two times a day (BID) | ORAL | 0 refills | 30 days | Status: CP
Start: 2021-02-16 — End: 2021-03-18

## 2021-02-16 MED ORDER — METRONIDAZOLE 250 MG TABLET
ORAL_TABLET | 0 refills | 0.00000 days | Status: CP
Start: 2021-02-16 — End: ?

## 2021-02-19 MED FILL — ESOMEPRAZOLE MAGNESIUM 40 MG CAPSULE,DELAYED RELEASE: ORAL | 30 days supply | Qty: 60 | Fill #5

## 2021-02-19 MED FILL — CYANOCOBALAMIN (VIT B-12) 1,000 MCG/ML INJECTION SOLUTION: SUBCUTANEOUS | 84 days supply | Qty: 6 | Fill #0

## 2021-02-19 MED FILL — BD LUER-LOK SYRINGE 3 ML 25 X 5/8": 84 days supply | Qty: 12 | Fill #2

## 2021-02-19 MED FILL — CIPROFLOXACIN 250 MG TABLET: ORAL | 30 days supply | Qty: 60 | Fill #0

## 2021-02-22 ENCOUNTER — Telehealth: Admit: 2021-02-22 | Discharge: 2021-02-23 | Payer: MEDICAID | Attending: Clinical | Primary: Clinical

## 2021-02-22 DIAGNOSIS — F329 Major depressive disorder, single episode, unspecified: Principal | ICD-10-CM

## 2021-02-22 DIAGNOSIS — F4310 Post-traumatic stress disorder, unspecified: Principal | ICD-10-CM

## 2021-02-22 DIAGNOSIS — F411 Generalized anxiety disorder: Principal | ICD-10-CM

## 2021-02-23 ENCOUNTER — Ambulatory Visit: Admit: 2021-02-23 | Discharge: 2021-02-24

## 2021-02-23 DIAGNOSIS — H52203 Unspecified astigmatism, bilateral: Principal | ICD-10-CM

## 2021-02-23 DIAGNOSIS — H5213 Myopia, bilateral: Principal | ICD-10-CM

## 2021-03-01 ENCOUNTER — Ambulatory Visit: Admit: 2021-03-01 | Payer: MEDICAID | Attending: Clinical | Primary: Clinical

## 2021-03-08 ENCOUNTER — Telehealth: Admit: 2021-03-08 | Discharge: 2021-03-09 | Payer: MEDICAID | Attending: Psychologist | Primary: Psychologist

## 2021-03-08 DIAGNOSIS — M2550 Pain in unspecified joint: Principal | ICD-10-CM

## 2021-03-08 DIAGNOSIS — G8929 Other chronic pain: Principal | ICD-10-CM

## 2021-03-08 DIAGNOSIS — M7918 Myalgia, other site: Principal | ICD-10-CM

## 2021-03-10 ENCOUNTER — Telehealth: Admit: 2021-03-10 | Discharge: 2021-03-11 | Payer: MEDICAID | Attending: Clinical | Primary: Clinical

## 2021-03-10 DIAGNOSIS — K50818 Crohn's disease of both small and large intestine with other complication: Principal | ICD-10-CM

## 2021-03-10 DIAGNOSIS — K610 Anal abscess: Principal | ICD-10-CM

## 2021-03-10 MED ORDER — AZATHIOPRINE 50 MG TABLET
ORAL_TABLET | Freq: Every day | ORAL | 0 refills | 90.00000 days
Start: 2021-03-10 — End: ?

## 2021-03-10 MED ORDER — METRONIDAZOLE 250 MG TABLET
ORAL_TABLET | Freq: Two times a day (BID) | ORAL | 0 refills | 30 days
Start: 2021-03-10 — End: ?

## 2021-03-11 MED ORDER — METRONIDAZOLE 250 MG TABLET
ORAL_TABLET | Freq: Two times a day (BID) | ORAL | 0 refills | 30 days | Status: CP
Start: 2021-03-11 — End: ?

## 2021-03-11 MED ORDER — AZATHIOPRINE 50 MG TABLET
ORAL_TABLET | Freq: Every day | ORAL | 0 refills | 90.00000 days | Status: CP
Start: 2021-03-11 — End: ?
  Filled 2021-03-31: qty 60, 30d supply, fill #0

## 2021-03-11 MED ORDER — ESOMEPRAZOLE MAGNESIUM 40 MG CAPSULE,DELAYED RELEASE
ORAL_CAPSULE | Freq: Two times a day (BID) | ORAL | 5 refills | 30.00000 days | Status: CP
Start: 2021-03-11 — End: 2022-03-11
  Filled 2021-03-12: qty 60, 30d supply, fill #0

## 2021-03-12 MED FILL — MAGNESIUM OXIDE 400 MG (241.3 MG MAGNESIUM) TABLET: ORAL | 30 days supply | Qty: 60 | Fill #6

## 2021-03-16 MED ORDER — ALPRAZOLAM 1 MG TABLET
ORAL_TABLET | Freq: Two times a day (BID) | ORAL | 3 refills | 30 days | PRN
Start: 2021-03-16 — End: ?

## 2021-03-16 MED ORDER — DEXTROAMPHETAMINE-AMPHETAMINE 20 MG TABLET
ORAL_TABLET | Freq: Two times a day (BID) | ORAL | 0 refills | 30 days
Start: 2021-03-16 — End: 2021-04-15

## 2021-03-17 ENCOUNTER — Ambulatory Visit: Admit: 2021-03-17 | Discharge: 2021-03-18 | Payer: MEDICAID

## 2021-03-17 MED ORDER — DEXTROAMPHETAMINE-AMPHETAMINE 20 MG TABLET
ORAL_TABLET | Freq: Two times a day (BID) | ORAL | 0 refills | 30 days | Status: CP
Start: 2021-03-17 — End: 2021-04-16

## 2021-03-22 ENCOUNTER — Ambulatory Visit: Admit: 2021-03-22 | Discharge: 2021-03-23 | Payer: MEDICAID

## 2021-03-24 ENCOUNTER — Telehealth: Admit: 2021-03-24 | Discharge: 2021-03-25 | Payer: MEDICAID | Attending: Clinical | Primary: Clinical

## 2021-03-24 DIAGNOSIS — F411 Generalized anxiety disorder: Principal | ICD-10-CM

## 2021-03-24 DIAGNOSIS — F329 Major depressive disorder, single episode, unspecified: Principal | ICD-10-CM

## 2021-03-29 MED ORDER — AMMONIUM LACTATE 12 % TOPICAL CREAM
6 refills | 0 days
Start: 2021-03-29 — End: ?

## 2021-03-30 MED ORDER — AMMONIUM LACTATE 12 % TOPICAL CREAM
6 refills | 0 days
Start: 2021-03-30 — End: ?

## 2021-04-05 ENCOUNTER — Ambulatory Visit: Admit: 2021-04-05 | Discharge: 2021-04-06

## 2021-04-05 ENCOUNTER — Ambulatory Visit
Admit: 2021-04-05 | Discharge: 2021-04-06 | Payer: MEDICAID | Attending: Physician Assistant | Primary: Physician Assistant

## 2021-04-05 MED ORDER — TRETINOIN 0.025 % TOPICAL CREAM
3 refills | 0.00000 days | Status: CP
Start: 2021-04-05 — End: 2021-04-05

## 2021-04-05 MED ORDER — METRONIDAZOLE 0.75 % TOPICAL CREAM
Freq: Two times a day (BID) | TOPICAL | 6 refills | 0.00000 days | Status: CP
Start: 2021-04-05 — End: ?

## 2021-04-07 ENCOUNTER — Ambulatory Visit: Admit: 2021-04-07 | Payer: MEDICAID | Attending: Clinical | Primary: Clinical

## 2021-04-08 DIAGNOSIS — R197 Diarrhea, unspecified: Principal | ICD-10-CM

## 2021-04-08 DIAGNOSIS — L719 Rosacea, unspecified: Principal | ICD-10-CM

## 2021-04-08 DIAGNOSIS — K50018 Crohn's disease of small intestine with other complication: Principal | ICD-10-CM

## 2021-04-08 DIAGNOSIS — L709 Acne, unspecified: Principal | ICD-10-CM

## 2021-04-08 MED ORDER — CIPROFLOXACIN 250 MG TABLET
ORAL_TABLET | Freq: Two times a day (BID) | ORAL | 0 refills | 30 days
Start: 2021-04-08 — End: ?

## 2021-04-08 MED ORDER — TRETINOIN 0.025 % TOPICAL CREAM
3 refills | 0.00000 days
Start: 2021-04-08 — End: ?

## 2021-04-08 MED ORDER — COLESTIPOL 1 GRAM TABLET
ORAL_TABLET | Freq: Two times a day (BID) | ORAL | 11 refills | 30.00000 days
Start: 2021-04-08 — End: 2022-04-08

## 2021-04-08 MED ORDER — AMMONIUM LACTATE 12 % TOPICAL CREAM
0 refills | 0.00000 days
Start: 2021-04-08 — End: ?

## 2021-04-08 MED ORDER — METRONIDAZOLE 0.75 % TOPICAL CREAM
Freq: Two times a day (BID) | TOPICAL | 6 refills | 0.00000 days
Start: 2021-04-08 — End: ?

## 2021-04-09 MED ORDER — CIPROFLOXACIN 250 MG TABLET
ORAL_TABLET | Freq: Two times a day (BID) | ORAL | 0 refills | 30.00000 days | Status: CP
Start: 2021-04-09 — End: ?
  Filled 2021-04-15: qty 60, 30d supply, fill #0

## 2021-04-09 MED ORDER — METRONIDAZOLE 0.75 % TOPICAL CREAM
Freq: Two times a day (BID) | TOPICAL | 8 refills | 0.00000 days | Status: CP
Start: 2021-04-09 — End: ?
  Filled 2021-04-15: qty 60, 30d supply, fill #0

## 2021-04-09 MED ORDER — TRETINOIN 0.025 % TOPICAL CREAM
3 refills | 0.00000 days | Status: CP
Start: 2021-04-09 — End: ?
  Filled 2021-05-13: qty 45, 22d supply, fill #0

## 2021-04-09 MED ORDER — AMMONIUM LACTATE 12 % TOPICAL CREAM
5 refills | 0.00000 days | Status: CP
Start: 2021-04-09 — End: ?

## 2021-04-12 DIAGNOSIS — R197 Diarrhea, unspecified: Principal | ICD-10-CM

## 2021-04-13 DIAGNOSIS — R197 Diarrhea, unspecified: Principal | ICD-10-CM

## 2021-04-13 MED ORDER — ALPRAZOLAM 1 MG TABLET
ORAL_TABLET | Freq: Two times a day (BID) | ORAL | 3 refills | 30 days | PRN
Start: 2021-04-13 — End: ?

## 2021-04-13 MED ORDER — COLESTIPOL 1 GRAM TABLET
ORAL_TABLET | Freq: Two times a day (BID) | ORAL | 11 refills | 30.00000 days | Status: CP
Start: 2021-04-13 — End: 2022-04-13
  Filled 2021-04-15: qty 120, 30d supply, fill #0

## 2021-04-13 MED ORDER — DEXTROAMPHETAMINE-AMPHETAMINE 20 MG TABLET
ORAL_TABLET | Freq: Two times a day (BID) | ORAL | 0 refills | 30 days
Start: 2021-04-13 — End: 2021-05-13

## 2021-04-14 DIAGNOSIS — L853 Xerosis cutis: Principal | ICD-10-CM

## 2021-04-14 MED ORDER — ALPRAZOLAM 1 MG TABLET
ORAL_TABLET | Freq: Two times a day (BID) | ORAL | 3 refills | 30 days | Status: CP | PRN
Start: 2021-04-14 — End: ?

## 2021-04-14 MED ORDER — DEXTROAMPHETAMINE-AMPHETAMINE 20 MG TABLET
ORAL_TABLET | Freq: Two times a day (BID) | ORAL | 0 refills | 30 days | Status: CP
Start: 2021-04-14 — End: 2021-05-14

## 2021-04-14 MED ORDER — AMMONIUM LACTATE 12 % TOPICAL CREAM
5 refills | 0.00000 days | Status: CP
Start: 2021-04-14 — End: ?
  Filled 2021-05-13: qty 385, 90d supply, fill #0

## 2021-04-15 MED FILL — ESOMEPRAZOLE MAGNESIUM 40 MG CAPSULE,DELAYED RELEASE: ORAL | 30 days supply | Qty: 60 | Fill #1

## 2021-04-15 MED FILL — MAGNESIUM OXIDE 400 MG (241.3 MG MAGNESIUM) TABLET: ORAL | 30 days supply | Qty: 60 | Fill #7

## 2021-04-15 MED FILL — FERROUS SULFATE 325 MG (65 MG IRON) TABLET,DELAYED RELEASE: ORAL | 100 days supply | Qty: 100 | Fill #0

## 2021-04-15 MED FILL — POTASSIUM CHLORIDE ER 20 MEQ TABLET,EXTENDED RELEASE(PART/CRYST): ORAL | 30 days supply | Qty: 30 | Fill #1

## 2021-04-15 MED FILL — ONDANSETRON 8 MG DISINTEGRATING TABLET: ORAL | 20 days supply | Qty: 60 | Fill #1

## 2021-04-19 ENCOUNTER — Telehealth: Admit: 2021-04-19 | Discharge: 2021-04-20 | Payer: MEDICAID | Attending: Clinical | Primary: Clinical

## 2021-04-19 DIAGNOSIS — F411 Generalized anxiety disorder: Principal | ICD-10-CM

## 2021-04-19 DIAGNOSIS — F329 Major depressive disorder, single episode, unspecified: Principal | ICD-10-CM

## 2021-04-20 ENCOUNTER — Telehealth
Admit: 2021-04-20 | Discharge: 2021-04-21 | Payer: MEDICAID | Attending: Student in an Organized Health Care Education/Training Program | Primary: Student in an Organized Health Care Education/Training Program

## 2021-04-20 DIAGNOSIS — F17200 Nicotine dependence, unspecified, uncomplicated: Principal | ICD-10-CM

## 2021-04-20 DIAGNOSIS — F900 Attention-deficit hyperactivity disorder, predominantly inattentive type: Principal | ICD-10-CM

## 2021-04-20 DIAGNOSIS — F4310 Post-traumatic stress disorder, unspecified: Principal | ICD-10-CM

## 2021-04-20 DIAGNOSIS — Z79899 Other long term (current) drug therapy: Principal | ICD-10-CM

## 2021-04-20 DIAGNOSIS — Z9189 Other specified personal risk factors, not elsewhere classified: Principal | ICD-10-CM

## 2021-04-20 MED ORDER — NICOTINE 14 MG/24 HR DAILY TRANSDERMAL PATCH
MEDICATED_PATCH | TRANSDERMAL | 2 refills | 30 days | Status: CP
Start: 2021-04-20 — End: 2021-07-19

## 2021-04-20 MED ORDER — NICOTINE (POLACRILEX) 2 MG BUCCAL LOZENGE
BUCCAL | 3 refills | 1 days | Status: CP | PRN
Start: 2021-04-20 — End: 2021-05-20

## 2021-04-21 ENCOUNTER — Ambulatory Visit: Admit: 2021-04-21 | Discharge: 2021-04-22 | Payer: MEDICAID

## 2021-04-21 DIAGNOSIS — H04123 Dry eye syndrome of bilateral lacrimal glands: Principal | ICD-10-CM

## 2021-04-21 DIAGNOSIS — H52203 Unspecified astigmatism, bilateral: Principal | ICD-10-CM

## 2021-04-21 DIAGNOSIS — H5213 Myopia, bilateral: Principal | ICD-10-CM

## 2021-04-22 MED FILL — AZATHIOPRINE 50 MG TABLET: ORAL | 30 days supply | Qty: 60 | Fill #1

## 2021-05-04 ENCOUNTER — Ambulatory Visit: Admit: 2021-05-04 | Discharge: 2021-05-05 | Payer: MEDICAID | Attending: Internal Medicine | Primary: Internal Medicine

## 2021-05-04 DIAGNOSIS — K5909 Other constipation: Principal | ICD-10-CM

## 2021-05-04 DIAGNOSIS — Z23 Encounter for immunization: Principal | ICD-10-CM

## 2021-05-04 DIAGNOSIS — K50018 Crohn's disease of small intestine with other complication: Principal | ICD-10-CM

## 2021-05-04 MED ORDER — POLYETHYLENE GLYCOL 3350 17 GRAM/DOSE ORAL POWDER
Freq: Every day | ORAL | 3 refills | 50.00000 days | Status: CP
Start: 2021-05-04 — End: 2021-05-04

## 2021-05-05 ENCOUNTER — Telehealth: Admit: 2021-05-05 | Discharge: 2021-05-06 | Payer: MEDICAID | Attending: Clinical | Primary: Clinical

## 2021-05-10 MED ORDER — DEXTROAMPHETAMINE-AMPHETAMINE 20 MG TABLET
ORAL_TABLET | Freq: Two times a day (BID) | ORAL | 0 refills | 30 days | Status: CP
Start: 2021-05-10 — End: 2021-06-09

## 2021-05-13 ENCOUNTER — Telehealth: Admit: 2021-05-13 | Discharge: 2021-05-14 | Payer: MEDICAID | Attending: Clinical | Primary: Clinical

## 2021-05-13 ENCOUNTER — Ambulatory Visit: Admit: 2021-05-13 | Discharge: 2021-05-14

## 2021-05-13 DIAGNOSIS — L719 Rosacea, unspecified: Principal | ICD-10-CM

## 2021-05-13 DIAGNOSIS — L853 Xerosis cutis: Principal | ICD-10-CM

## 2021-05-14 DIAGNOSIS — L719 Rosacea, unspecified: Principal | ICD-10-CM

## 2021-05-14 MED ORDER — DEXTROAMPHETAMINE-AMPHETAMINE 20 MG TABLET
ORAL_TABLET | Freq: Two times a day (BID) | ORAL | 0 refills | 30.00000 days | Status: CP
Start: 2021-05-14 — End: 2021-06-13

## 2021-05-14 MED ORDER — METRONIDAZOLE 0.75 % TOPICAL CREAM
Freq: Two times a day (BID) | TOPICAL | 8 refills | 0.00000 days | Status: CP
Start: 2021-05-14 — End: ?

## 2021-05-16 MED ORDER — ERGOCALCIFEROL (VITAMIN D2) 1,250 MCG (50,000 UNIT) CAPSULE
ORAL_CAPSULE | 1 refills | 0 days
Start: 2021-05-16 — End: ?

## 2021-05-17 MED ORDER — ERGOCALCIFEROL (VITAMIN D2) 1,250 MCG (50,000 UNIT) CAPSULE
ORAL_CAPSULE | ORAL | 1 refills | 84.00000 days | Status: CP
Start: 2021-05-17 — End: ?

## 2021-05-18 MED FILL — MAGNESIUM OXIDE 400 MG (241.3 MG MAGNESIUM) TABLET: ORAL | 30 days supply | Qty: 60 | Fill #8

## 2021-05-18 MED FILL — POTASSIUM CHLORIDE ER 20 MEQ TABLET,EXTENDED RELEASE(PART/CRYST): ORAL | 30 days supply | Qty: 30 | Fill #2

## 2021-05-18 MED FILL — CYANOCOBALAMIN (VIT B-12) 1,000 MCG/ML INJECTION SOLUTION: SUBCUTANEOUS | 84 days supply | Qty: 6 | Fill #1

## 2021-05-20 ENCOUNTER — Other Ambulatory Visit: Payer: Self-pay | Admitting: Neurology

## 2021-05-25 ENCOUNTER — Telehealth: Admit: 2021-05-25 | Discharge: 2021-05-26 | Payer: MEDICAID | Attending: Clinical | Primary: Clinical

## 2021-05-25 DIAGNOSIS — F411 Generalized anxiety disorder: Principal | ICD-10-CM

## 2021-05-25 DIAGNOSIS — F329 Major depressive disorder, single episode, unspecified: Principal | ICD-10-CM

## 2021-06-01 ENCOUNTER — Ambulatory Visit: Admit: 2021-06-01 | Discharge: 2021-06-02 | Payer: MEDICAID

## 2021-06-01 DIAGNOSIS — K5909 Other constipation: Principal | ICD-10-CM

## 2021-06-01 MED ORDER — POLYETHYLENE GLYCOL 3350 17 GRAM/DOSE ORAL POWDER
Freq: Every day | ORAL | 3 refills | 50.00000 days | Status: CP
Start: 2021-06-01 — End: ?

## 2021-06-01 MED ORDER — CYCLOSPORINE 0.05 % EYE DROPS IN A DROPPERETTE
Freq: Two times a day (BID) | OPHTHALMIC | 3 refills | 90.00000 days | Status: CP
Start: 2021-06-01 — End: 2021-06-01

## 2021-06-01 MED FILL — AZATHIOPRINE 50 MG TABLET: ORAL | 30 days supply | Qty: 60 | Fill #2

## 2021-06-07 ENCOUNTER — Ambulatory Visit: Admit: 2021-06-07 | Payer: MEDICAID | Attending: Professional | Primary: Professional

## 2021-06-08 ENCOUNTER — Telehealth
Admit: 2021-06-08 | Discharge: 2021-06-09 | Payer: MEDICAID | Attending: Student in an Organized Health Care Education/Training Program | Primary: Student in an Organized Health Care Education/Training Program

## 2021-06-08 DIAGNOSIS — F17200 Nicotine dependence, unspecified, uncomplicated: Principal | ICD-10-CM

## 2021-06-08 DIAGNOSIS — Z79899 Other long term (current) drug therapy: Principal | ICD-10-CM

## 2021-06-08 DIAGNOSIS — F329 Major depressive disorder, single episode, unspecified: Principal | ICD-10-CM

## 2021-06-08 DIAGNOSIS — F411 Generalized anxiety disorder: Principal | ICD-10-CM

## 2021-06-08 DIAGNOSIS — F900 Attention-deficit hyperactivity disorder, predominantly inattentive type: Principal | ICD-10-CM

## 2021-06-08 DIAGNOSIS — F4310 Post-traumatic stress disorder, unspecified: Principal | ICD-10-CM

## 2021-06-08 MED ORDER — NICOTINE (POLACRILEX) 2 MG BUCCAL LOZENGE
BUCCAL | 3 refills | 1 days | Status: CP | PRN
Start: 2021-06-08 — End: 2021-07-08

## 2021-06-08 MED ORDER — DULOXETINE 30 MG CAPSULE,DELAYED RELEASE
ORAL_CAPSULE | Freq: Every day | ORAL | 3 refills | 30 days | Status: CP
Start: 2021-06-08 — End: 2021-10-06

## 2021-06-09 ENCOUNTER — Ambulatory Visit: Admit: 2021-06-09 | Payer: MEDICAID | Attending: Clinical | Primary: Clinical

## 2021-06-13 MED ORDER — DEXTROAMPHETAMINE-AMPHETAMINE 20 MG TABLET
ORAL_TABLET | Freq: Two times a day (BID) | ORAL | 0 refills | 30.00000 days | Status: CP
Start: 2021-06-13 — End: 2021-07-13

## 2021-06-13 MED ORDER — ALPRAZOLAM 1 MG TABLET
ORAL_TABLET | Freq: Two times a day (BID) | ORAL | 3 refills | 30 days | PRN
Start: 2021-06-13 — End: ?

## 2021-06-14 ENCOUNTER — Telehealth: Admit: 2021-06-14 | Discharge: 2021-06-14 | Payer: MEDICAID | Attending: Clinical | Primary: Clinical

## 2021-06-14 ENCOUNTER — Ambulatory Visit: Admit: 2021-06-14 | Discharge: 2021-06-14 | Payer: MEDICAID | Attending: Clinical | Primary: Clinical

## 2021-06-14 DIAGNOSIS — F411 Generalized anxiety disorder: Principal | ICD-10-CM

## 2021-06-14 DIAGNOSIS — F329 Major depressive disorder, single episode, unspecified: Principal | ICD-10-CM

## 2021-06-14 MED ORDER — ALPRAZOLAM 1 MG TABLET
ORAL_TABLET | Freq: Two times a day (BID) | ORAL | 3 refills | 30 days | PRN
Start: 2021-06-14 — End: ?

## 2021-06-21 ENCOUNTER — Telehealth: Admit: 2021-06-21 | Discharge: 2021-06-22 | Payer: MEDICAID | Attending: Professional | Primary: Professional

## 2021-06-28 ENCOUNTER — Ambulatory Visit: Admit: 2021-06-28 | Payer: MEDICAID | Attending: Professional | Primary: Professional

## 2021-07-04 DIAGNOSIS — K50018 Crohn's disease of small intestine with other complication: Principal | ICD-10-CM

## 2021-07-04 DIAGNOSIS — K610 Anal abscess: Principal | ICD-10-CM

## 2021-07-04 DIAGNOSIS — K50818 Crohn's disease of both small and large intestine with other complication: Principal | ICD-10-CM

## 2021-07-04 MED ORDER — MAGNESIUM OXIDE 400 MG (241.3 MG MAGNESIUM) TABLET
ORAL_TABLET | Freq: Two times a day (BID) | ORAL | 11 refills | 30 days
Start: 2021-07-04 — End: ?

## 2021-07-04 MED ORDER — METRONIDAZOLE 250 MG TABLET
ORAL_TABLET | Freq: Two times a day (BID) | ORAL | 0 refills | 30 days
Start: 2021-07-04 — End: ?

## 2021-07-04 MED ORDER — POTASSIUM CHLORIDE ER 20 MEQ TABLET,EXTENDED RELEASE(PART/CRYST)
ORAL_TABLET | Freq: Every day | ORAL | 2 refills | 30.00000 days
Start: 2021-07-04 — End: ?

## 2021-07-04 MED ORDER — CIPROFLOXACIN 250 MG TABLET
ORAL_TABLET | Freq: Two times a day (BID) | ORAL | 0 refills | 30 days
Start: 2021-07-04 — End: ?

## 2021-07-04 MED ORDER — AZATHIOPRINE 50 MG TABLET
ORAL_TABLET | Freq: Every day | ORAL | 0 refills | 90.00000 days
Start: 2021-07-04 — End: ?

## 2021-07-05 MED ORDER — MAGNESIUM OXIDE 400 MG (241.3 MG MAGNESIUM) TABLET
ORAL_TABLET | Freq: Two times a day (BID) | ORAL | 11 refills | 30 days | Status: CP
Start: 2021-07-05 — End: ?
  Filled 2021-07-05: qty 60, 30d supply, fill #0

## 2021-07-05 MED ORDER — METRONIDAZOLE 250 MG TABLET
ORAL_TABLET | Freq: Two times a day (BID) | ORAL | 0 refills | 30.00000 days | Status: CP
Start: 2021-07-05 — End: ?
  Filled 2021-07-05: qty 60, 30d supply, fill #0

## 2021-07-05 MED ORDER — AZATHIOPRINE 50 MG TABLET
ORAL_TABLET | Freq: Every day | ORAL | 0 refills | 90.00000 days | Status: CP
Start: 2021-07-05 — End: ?
  Filled 2021-07-05: qty 60, 30d supply, fill #0

## 2021-07-05 MED ORDER — CIPROFLOXACIN 250 MG TABLET
ORAL_TABLET | Freq: Two times a day (BID) | ORAL | 0 refills | 30.00000 days | Status: CP
Start: 2021-07-05 — End: ?
  Filled 2021-07-05: qty 60, 30d supply, fill #0

## 2021-07-05 MED ORDER — POTASSIUM CHLORIDE ER 20 MEQ TABLET,EXTENDED RELEASE(PART/CRYST)
ORAL_TABLET | Freq: Every day | ORAL | 2 refills | 30.00000 days | Status: CP
Start: 2021-07-05 — End: ?
  Filled 2021-07-05: qty 30, 30d supply, fill #0

## 2021-07-05 MED FILL — ESOMEPRAZOLE MAGNESIUM 40 MG CAPSULE,DELAYED RELEASE: ORAL | 30 days supply | Qty: 60 | Fill #2

## 2021-07-05 MED FILL — FAMOTIDINE 40 MG TABLET: ORAL | 90 days supply | Qty: 90 | Fill #1

## 2021-07-05 MED FILL — ONDANSETRON 8 MG DISINTEGRATING TABLET: ORAL | 20 days supply | Qty: 60 | Fill #2

## 2021-07-10 MED ORDER — ALPRAZOLAM 1 MG TABLET
ORAL_TABLET | Freq: Two times a day (BID) | ORAL | 3 refills | 30.00000 days | PRN
Start: 2021-07-10 — End: ?

## 2021-07-10 MED ORDER — DEXTROAMPHETAMINE-AMPHETAMINE 20 MG TABLET
ORAL_TABLET | Freq: Two times a day (BID) | ORAL | 0 refills | 30 days
Start: 2021-07-10 — End: 2021-08-09

## 2021-07-12 MED ORDER — ALPRAZOLAM 1 MG TABLET
ORAL_TABLET | Freq: Two times a day (BID) | ORAL | 3 refills | 30.00000 days | Status: CP | PRN
Start: 2021-07-12 — End: ?

## 2021-07-12 MED ORDER — DEXTROAMPHETAMINE-AMPHETAMINE 20 MG TABLET
ORAL_TABLET | Freq: Two times a day (BID) | ORAL | 0 refills | 30 days | Status: CP
Start: 2021-07-12 — End: 2021-08-11

## 2021-07-13 ENCOUNTER — Telehealth: Payer: Self-pay | Admitting: Neurology

## 2021-07-13 NOTE — Telephone Encounter (Signed)
LVM for pt appt was rescheduled due to Judson Roch being out.

## 2021-07-19 ENCOUNTER — Ambulatory Visit: Admit: 2021-07-19 | Payer: BLUE CROSS/BLUE SHIELD | Attending: Professional | Primary: Professional

## 2021-07-20 ENCOUNTER — Ambulatory Visit: Admit: 2021-07-20 | Discharge: 2021-07-21 | Payer: BLUE CROSS/BLUE SHIELD

## 2021-07-20 ENCOUNTER — Telehealth
Admit: 2021-07-20 | Discharge: 2021-07-21 | Payer: BLUE CROSS/BLUE SHIELD | Attending: Student in an Organized Health Care Education/Training Program | Primary: Student in an Organized Health Care Education/Training Program

## 2021-07-20 MED ORDER — CLONIDINE HCL ER 0.1 MG TABLET,EXTENDED RELEASE,12 HR
ORAL_TABLET | Freq: Every evening | ORAL | 1 refills | 30 days | Status: CP
Start: 2021-07-20 — End: 2021-09-18

## 2021-07-21 ENCOUNTER — Telehealth: Admit: 2021-07-21 | Discharge: 2021-07-22 | Payer: BLUE CROSS/BLUE SHIELD | Attending: Clinical | Primary: Clinical

## 2021-07-21 DIAGNOSIS — F4310 Post-traumatic stress disorder, unspecified: Principal | ICD-10-CM

## 2021-07-21 DIAGNOSIS — F909 Attention-deficit hyperactivity disorder, unspecified type: Principal | ICD-10-CM

## 2021-07-21 DIAGNOSIS — F411 Generalized anxiety disorder: Principal | ICD-10-CM

## 2021-07-23 MED ORDER — ALPRAZOLAM 1 MG TABLET
ORAL_TABLET | Freq: Two times a day (BID) | ORAL | 3 refills | 30 days | PRN
Start: 2021-07-23 — End: ?

## 2021-07-23 MED ORDER — DEXTROAMPHETAMINE-AMPHETAMINE 20 MG TABLET
ORAL_TABLET | Freq: Two times a day (BID) | ORAL | 0 refills | 30 days
Start: 2021-07-23 — End: 2021-08-22

## 2021-07-26 ENCOUNTER — Ambulatory Visit: Payer: Medicaid Other | Admitting: Neurology

## 2021-07-26 ENCOUNTER — Ambulatory Visit: Admit: 2021-07-26 | Payer: BLUE CROSS/BLUE SHIELD | Attending: Professional | Primary: Professional

## 2021-07-28 ENCOUNTER — Telehealth: Admit: 2021-07-28 | Discharge: 2021-07-29 | Payer: BLUE CROSS/BLUE SHIELD | Attending: Clinical | Primary: Clinical

## 2021-07-28 DIAGNOSIS — F411 Generalized anxiety disorder: Principal | ICD-10-CM

## 2021-08-03 MED ORDER — ERGOCALCIFEROL (VITAMIN D2) 1,250 MCG (50,000 UNIT) CAPSULE
ORAL_CAPSULE | ORAL | 1 refills | 0.00000 days | Status: CP
Start: 2021-08-03 — End: ?

## 2021-08-08 MED ORDER — ALPRAZOLAM 1 MG TABLET
ORAL_TABLET | Freq: Two times a day (BID) | ORAL | 3 refills | 30 days | Status: CP | PRN
Start: 2021-08-08 — End: ?

## 2021-08-08 MED ORDER — DEXTROAMPHETAMINE-AMPHETAMINE 20 MG TABLET
ORAL_TABLET | Freq: Two times a day (BID) | ORAL | 0 refills | 30.00000 days | Status: CP
Start: 2021-08-08 — End: 2021-09-07

## 2021-08-10 ENCOUNTER — Encounter: Payer: Self-pay | Admitting: Neurology

## 2021-08-10 ENCOUNTER — Ambulatory Visit: Payer: Medicaid Other | Admitting: Neurology
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
## 2021-08-24 DIAGNOSIS — K50818 Crohn's disease of both small and large intestine with other complication: Principal | ICD-10-CM

## 2021-08-24 DIAGNOSIS — K5909 Other constipation: Principal | ICD-10-CM

## 2021-08-24 DIAGNOSIS — K50018 Crohn's disease of small intestine with other complication: Principal | ICD-10-CM

## 2021-08-24 DIAGNOSIS — K610 Anal abscess: Principal | ICD-10-CM

## 2021-08-24 MED ORDER — CIPROFLOXACIN 250 MG TABLET
ORAL_TABLET | Freq: Two times a day (BID) | ORAL | 0 refills | 30 days
Start: 2021-08-24 — End: ?

## 2021-08-24 MED ORDER — FAMOTIDINE 40 MG TABLET
ORAL_TABLET | Freq: Two times a day (BID) | ORAL | 1 refills | 90.00000 days
Start: 2021-08-24 — End: 2022-08-24

## 2021-08-24 MED ORDER — POLYETHYLENE GLYCOL 3350 17 GRAM/DOSE ORAL POWDER
Freq: Every day | ORAL | 3 refills | 50.00000 days
Start: 2021-08-24 — End: ?

## 2021-08-24 MED ORDER — METRONIDAZOLE 250 MG TABLET
ORAL_TABLET | Freq: Two times a day (BID) | ORAL | 0 refills | 30.00000 days
Start: 2021-08-24 — End: ?

## 2021-08-24 MED ORDER — ONDANSETRON 8 MG DISINTEGRATING TABLET
ORAL_TABLET | Freq: Three times a day (TID) | ORAL | 2 refills | 20 days | PRN
Start: 2021-08-24 — End: ?

## 2021-08-25 MED ORDER — POLYETHYLENE GLYCOL 3350 17 GRAM/DOSE ORAL POWDER
Freq: Every day | ORAL | 3 refills | 50.00000 days | Status: CP
Start: 2021-08-25 — End: ?
  Filled 2021-09-09: qty 476, 28d supply, fill #0

## 2021-08-26 MED FILL — AZATHIOPRINE 50 MG TABLET: ORAL | 30 days supply | Qty: 60 | Fill #1

## 2021-08-26 MED FILL — ESOMEPRAZOLE MAGNESIUM 40 MG CAPSULE,DELAYED RELEASE: ORAL | 30 days supply | Qty: 60 | Fill #3

## 2021-08-26 MED FILL — FERROUS SULFATE 325 MG (65 MG IRON) TABLET,DELAYED RELEASE: ORAL | 100 days supply | Qty: 100 | Fill #1

## 2021-08-27 MED ORDER — CIPROFLOXACIN 250 MG TABLET
ORAL_TABLET | Freq: Two times a day (BID) | ORAL | 0 refills | 30 days | Status: CP
Start: 2021-08-27 — End: ?
  Filled 2021-08-30: qty 60, 30d supply, fill #0

## 2021-08-27 MED ORDER — FAMOTIDINE 40 MG TABLET
ORAL_TABLET | Freq: Two times a day (BID) | ORAL | 1 refills | 90 days | Status: CP
Start: 2021-08-27 — End: 2022-08-27
  Filled 2021-08-30: qty 90, 90d supply, fill #0

## 2021-08-27 MED ORDER — METRONIDAZOLE 250 MG TABLET
ORAL_TABLET | Freq: Two times a day (BID) | ORAL | 0 refills | 30.00000 days | Status: CP
Start: 2021-08-27 — End: ?
  Filled 2021-08-30: qty 60, 30d supply, fill #0

## 2021-08-27 MED ORDER — ONDANSETRON 8 MG DISINTEGRATING TABLET
ORAL_TABLET | Freq: Three times a day (TID) | ORAL | 2 refills | 20.00000 days | Status: CP | PRN
Start: 2021-08-27 — End: ?
  Filled 2021-08-30: qty 60, 20d supply, fill #0

## 2021-09-08 MED ORDER — CYCLOSPORINE 0.05 % EYE DROPS IN A DROPPERETTE
Freq: Two times a day (BID) | OPHTHALMIC | 3 refills | 90 days
Start: 2021-09-08 — End: 2022-09-03

## 2021-09-09 MED ORDER — DEXTROAMPHETAMINE-AMPHETAMINE 20 MG TABLET
ORAL_TABLET | Freq: Two times a day (BID) | ORAL | 0 refills | 30 days
Start: 2021-09-09 — End: 2021-10-09

## 2021-09-09 MED ORDER — ALPRAZOLAM 1 MG TABLET
ORAL_TABLET | Freq: Two times a day (BID) | ORAL | 3 refills | 30.00000 days | PRN
Start: 2021-09-09 — End: ?

## 2021-09-09 MED FILL — CYANOCOBALAMIN (VIT B-12) 1,000 MCG/ML INJECTION SOLUTION: SUBCUTANEOUS | 28 days supply | Qty: 2 | Fill #2

## 2021-09-09 MED FILL — POTASSIUM CHLORIDE ER 20 MEQ TABLET,EXTENDED RELEASE(PART/CRYST): ORAL | 30 days supply | Qty: 30 | Fill #1

## 2021-09-09 MED FILL — AMMONIUM LACTATE 12 % TOPICAL CREAM: 30 days supply | Qty: 385 | Fill #1

## 2021-09-09 MED FILL — MAGNESIUM OXIDE 400 MG (241.3 MG MAGNESIUM) TABLET: ORAL | 30 days supply | Qty: 60 | Fill #1

## 2021-09-10 MED ORDER — ALPRAZOLAM 1 MG TABLET
ORAL_TABLET | Freq: Two times a day (BID) | ORAL | 3 refills | 30.00000 days | Status: CP | PRN
Start: 2021-09-10 — End: ?

## 2021-09-10 MED ORDER — CYCLOSPORINE 0.05 % EYE DROPS IN A DROPPERETTE
Freq: Two times a day (BID) | OPHTHALMIC | 3 refills | 90.00000 days | Status: CP
Start: 2021-09-10 — End: 2022-09-05
  Filled 2021-09-21: qty 180, 90d supply, fill #0

## 2021-09-10 MED ORDER — DEXTROAMPHETAMINE-AMPHETAMINE 20 MG TABLET
ORAL_TABLET | Freq: Two times a day (BID) | ORAL | 0 refills | 30 days | Status: CP
Start: 2021-09-10 — End: 2021-10-10

## 2021-09-13 MED ORDER — DEXTROAMPHETAMINE-AMPHETAMINE ER 20 MG 24HR CAPSULE,EXTEND RELEASE
ORAL_CAPSULE | Freq: Every morning | ORAL | 0 refills | 30.00000 days | Status: CP
Start: 2021-09-13 — End: 2021-09-13
  Filled 2021-09-16: qty 60, 30d supply, fill #0

## 2021-09-28 ENCOUNTER — Telehealth
Admit: 2021-09-28 | Discharge: 2021-09-29 | Payer: BLUE CROSS/BLUE SHIELD | Attending: Student in an Organized Health Care Education/Training Program | Primary: Student in an Organized Health Care Education/Training Program

## 2021-09-29 DIAGNOSIS — Z79899 Other long term (current) drug therapy: Principal | ICD-10-CM

## 2021-09-29 DIAGNOSIS — K5090 Crohn's disease, unspecified, without complications: Principal | ICD-10-CM

## 2021-09-29 DIAGNOSIS — R5383 Other fatigue: Principal | ICD-10-CM

## 2021-09-29 MED ORDER — DEXTROAMPHETAMINE-AMPHETAMINE 20 MG TABLET
ORAL_TABLET | Freq: Two times a day (BID) | ORAL | 0 refills | 30.00000 days | Status: CP
Start: 2021-09-29 — End: 2021-10-29

## 2021-09-29 MED FILL — AZATHIOPRINE 50 MG TABLET: ORAL | 30 days supply | Qty: 60 | Fill #2

## 2021-09-29 MED FILL — CYANOCOBALAMIN (VIT B-12) 1,000 MCG/ML INJECTION SOLUTION: SUBCUTANEOUS | 28 days supply | Qty: 2 | Fill #3

## 2021-09-29 MED FILL — ONDANSETRON 8 MG DISINTEGRATING TABLET: ORAL | 20 days supply | Qty: 60 | Fill #1

## 2021-09-29 MED FILL — ESOMEPRAZOLE MAGNESIUM 40 MG CAPSULE,DELAYED RELEASE: ORAL | 30 days supply | Qty: 60 | Fill #4

## 2021-10-04 MED ORDER — NICOTINE (POLACRILEX) 2 MG BUCCAL LOZENGE
BUCCAL | 3 refills | 1.00000 days | Status: CP | PRN
Start: 2021-10-04 — End: 2021-10-04

## 2021-10-04 MED ORDER — NICOTINE 14 MG/24 HR DAILY TRANSDERMAL PATCH
MEDICATED_PATCH | TRANSDERMAL | 0 refills | 30 days | Status: CP
Start: 2021-10-04 — End: 2021-11-03

## 2021-10-04 MED ORDER — ALPRAZOLAM 1 MG TABLET
ORAL_TABLET | Freq: Two times a day (BID) | ORAL | 3 refills | 30.00000 days | Status: CP | PRN
Start: 2021-10-04 — End: 2021-10-04

## 2021-10-26 ENCOUNTER — Ambulatory Visit
Admit: 2021-10-26 | Discharge: 2021-10-27 | Payer: BLUE CROSS/BLUE SHIELD | Attending: Internal Medicine | Primary: Internal Medicine

## 2021-10-26 DIAGNOSIS — K50018 Crohn's disease of small intestine with other complication: Principal | ICD-10-CM

## 2021-10-28 ENCOUNTER — Ambulatory Visit: Payer: Medicaid Other | Admitting: Neurology

## 2021-10-28 VITALS — BP 139/91 | HR 106 | Ht 63.0 in | Wt 150.0 lb

## 2021-10-28 DIAGNOSIS — M791 Myalgia, unspecified site: Secondary | ICD-10-CM | POA: Diagnosis not present

## 2021-10-28 DIAGNOSIS — R202 Paresthesia of skin: Secondary | ICD-10-CM | POA: Diagnosis not present

## 2021-10-28 DIAGNOSIS — F39 Unspecified mood [affective] disorder: Secondary | ICD-10-CM | POA: Diagnosis not present

## 2021-10-28 DIAGNOSIS — R569 Unspecified convulsions: Secondary | ICD-10-CM

## 2021-10-28 DIAGNOSIS — R208 Other disturbances of skin sensation: Secondary | ICD-10-CM

## 2021-10-28 MED ORDER — GABAPENTIN 800 MG PO TABS: 800.0000 mg | ORAL_TABLET | Freq: Four times a day (QID) | ORAL | 3 refills | Status: AC

## 2021-10-28 MED ORDER — LEVETIRACETAM 250 MG PO TABS
ORAL_TABLET | ORAL | 5 refills | Status: DC
Start: 2021-10-28 — End: 2024-05-05

## 2021-10-28 NOTE — Patient Instructions (Signed)
Meds ordered this encounter  ?Medications  ? gabapentin (NEURONTIN) 800 MG tablet  ?  Sig: Take 1 tablet (800 mg total) by mouth 4 (four) times daily.  ?  Dispense:  120 tablet  ?  Refill:  3  ? levETIRAcetam (KEPPRA) 250 MG tablet  ?  Sig: Take 1 tablet twice daily x 2 weeks, then increase to 2 tablets twice daily  ?  Dispense:  120 tablet  ?  Refill:  5  ?. ?Check EEG, Check Nerve conduction  ?Return back in 6 months with Dr. Roxy Horseman  ?
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
## 2021-10-29 MED ORDER — DEXTROAMPHETAMINE-AMPHETAMINE ER 20 MG 24HR CAPSULE,EXTEND RELEASE
ORAL_CAPSULE | Freq: Every morning | ORAL | 0 refills | 30 days | Status: CP
Start: 2021-10-29 — End: 2021-11-28

## 2021-10-30 DIAGNOSIS — K610 Anal abscess: Principal | ICD-10-CM

## 2021-10-30 DIAGNOSIS — K50018 Crohn's disease of small intestine with other complication: Principal | ICD-10-CM

## 2021-10-30 DIAGNOSIS — K50818 Crohn's disease of both small and large intestine with other complication: Principal | ICD-10-CM

## 2021-10-30 MED ORDER — METRONIDAZOLE 250 MG TABLET
ORAL_TABLET | Freq: Two times a day (BID) | ORAL | 0 refills | 30 days
Start: 2021-10-30 — End: ?

## 2021-10-30 MED ORDER — AZATHIOPRINE 50 MG TABLET
ORAL_TABLET | Freq: Every day | ORAL | 0 refills | 90 days
Start: 2021-10-30 — End: ?

## 2021-10-30 MED ORDER — CIPROFLOXACIN 250 MG TABLET
ORAL_TABLET | Freq: Two times a day (BID) | ORAL | 0 refills | 30 days
Start: 2021-10-30 — End: ?

## 2021-10-30 MED ORDER — ALPRAZOLAM 1 MG TABLET
ORAL_TABLET | Freq: Two times a day (BID) | ORAL | 3 refills | 30 days | PRN
Start: 2021-10-30 — End: ?

## 2021-10-30 MED ORDER — ERGOCALCIFEROL (VITAMIN D2) 1,250 MCG (50,000 UNIT) CAPSULE
ORAL_CAPSULE | 1 refills | 0 days
Start: 2021-10-30 — End: ?

## 2021-11-01 MED ORDER — ERGOCALCIFEROL (VITAMIN D2) 1,250 MCG (50,000 UNIT) CAPSULE
ORAL_CAPSULE | 1 refills | 0 days
Start: 2021-11-01 — End: ?

## 2021-11-01 MED ORDER — ALPRAZOLAM 1 MG TABLET
ORAL_TABLET | Freq: Two times a day (BID) | ORAL | 3 refills | 30 days | Status: CP | PRN
Start: 2021-11-01 — End: ?
  Filled 2021-11-05: qty 60, 30d supply, fill #0

## 2021-11-02 ENCOUNTER — Telehealth: Payer: Self-pay | Admitting: Neurology

## 2021-11-02 ENCOUNTER — Ambulatory Visit: Admit: 2021-11-02 | Discharge: 2021-11-03 | Payer: BLUE CROSS/BLUE SHIELD

## 2021-11-02 ENCOUNTER — Ambulatory Visit
Admit: 2021-11-02 | Discharge: 2021-11-03 | Payer: BLUE CROSS/BLUE SHIELD | Attending: Internal Medicine | Primary: Internal Medicine

## 2021-11-02 DIAGNOSIS — Z973 Presence of spectacles and contact lenses: Principal | ICD-10-CM

## 2021-11-02 DIAGNOSIS — E559 Vitamin D deficiency, unspecified: Principal | ICD-10-CM

## 2021-11-02 DIAGNOSIS — H52203 Unspecified astigmatism, bilateral: Principal | ICD-10-CM

## 2021-11-02 DIAGNOSIS — H04123 Dry eye syndrome of bilateral lacrimal glands: Principal | ICD-10-CM

## 2021-11-02 DIAGNOSIS — H5213 Myopia, bilateral: Principal | ICD-10-CM

## 2021-11-02 DIAGNOSIS — L719 Rosacea, unspecified: Principal | ICD-10-CM

## 2021-11-02 NOTE — Telephone Encounter (Signed)
Referral for NCV/EMG sent to [facility] [phone]. ?

## 2021-11-04 MED ORDER — AZATHIOPRINE 50 MG TABLET
ORAL_TABLET | Freq: Every day | ORAL | 0 refills | 90 days | Status: CP
Start: 2021-11-04 — End: ?
  Filled 2021-11-05: qty 180, 90d supply, fill #0

## 2021-11-04 MED ORDER — CIPROFLOXACIN 250 MG TABLET
ORAL_TABLET | Freq: Two times a day (BID) | ORAL | 0 refills | 30 days | Status: CP
Start: 2021-11-04 — End: ?
  Filled 2021-11-05: qty 60, 30d supply, fill #0

## 2021-11-04 MED ORDER — METRONIDAZOLE 250 MG TABLET
ORAL_TABLET | Freq: Two times a day (BID) | ORAL | 0 refills | 30 days | Status: CP
Start: 2021-11-04 — End: ?
  Filled 2021-11-05: qty 60, 30d supply, fill #0

## 2021-11-05 MED FILL — AMMONIUM LACTATE 12 % TOPICAL CREAM: 30 days supply | Qty: 385 | Fill #2

## 2021-11-05 MED FILL — CYANOCOBALAMIN (VIT B-12) 1,000 MCG/ML INJECTION SOLUTION: SUBCUTANEOUS | 28 days supply | Qty: 2 | Fill #4

## 2021-11-05 MED FILL — MAGNESIUM OXIDE 400 MG (241.3 MG MAGNESIUM) TABLET: ORAL | 30 days supply | Qty: 60 | Fill #2

## 2021-11-05 MED FILL — POTASSIUM CHLORIDE ER 20 MEQ TABLET,EXTENDED RELEASE(PART/CRYST): ORAL | 30 days supply | Qty: 30 | Fill #2

## 2021-11-05 MED FILL — ONDANSETRON 8 MG DISINTEGRATING TABLET: ORAL | 20 days supply | Qty: 60 | Fill #2

## 2021-11-05 MED FILL — ESOMEPRAZOLE MAGNESIUM 40 MG CAPSULE,DELAYED RELEASE: ORAL | 30 days supply | Qty: 60 | Fill #5

## 2021-11-10 ENCOUNTER — Ambulatory Visit: Payer: Medicaid Other | Admitting: Neurology

## 2021-11-10 DIAGNOSIS — R569 Unspecified convulsions: Secondary | ICD-10-CM | POA: Diagnosis not present

## 2021-11-15 NOTE — Procedures (Signed)
? ? ?  History: ? ?41 year old woman with history of seizure ? ?EEG classification:  Awake and asleep ? ?Description of the recording: The background rhythms of this recording consists of a fairly well modulated medium amplitude background activity of 10 Hz. As the record progresses, the patient initially is in the waking state, but appears to enter the early stage II sleep during the recording, with rudimentary sleep spindles and vertex sharp wave activity seen. During the wakeful state, photic stimulation is performed, and no abnormal responses were seen. Hyperventilation was also performed, no abnormal response seen. No epileptiform discharges seen during this recording. There was no focal slowing. EKG monitor shows no evidence of cardiac rhythm abnormalities with a heart rate of 72. ? ?Abnormality: None  ? ?Impression: This is a normal EEG recording in the waking and sleeping state. No evidence interictal epileptiform discharges were seen at any time during the recording.  A normal EEG does not exclude a diagnosis of epilepsy.  ? ? ?Alric Ran, MD ?[facility]  ?

## 2021-11-16 ENCOUNTER — Telehealth: Payer: Self-pay | Admitting: *Deleted

## 2021-11-16 ENCOUNTER — Ambulatory Visit: Admit: 2021-11-16 | Discharge: 2021-11-17 | Payer: BLUE CROSS/BLUE SHIELD

## 2021-11-16 DIAGNOSIS — H52203 Unspecified astigmatism, bilateral: Principal | ICD-10-CM

## 2021-11-16 DIAGNOSIS — H5213 Myopia, bilateral: Principal | ICD-10-CM

## 2021-11-16 NOTE — Telephone Encounter (Signed)
-----   Message from Suzzanne Cloud, NP sent at 11/16/2021  5:49 AM EDT ----- ?Impression: This is a normal EEG recording in the waking and sleeping state. No evidence interictal epileptiform discharges were seen at any time during the recording.  A normal EEG does not exclude a diagnosis of epilepsy.  ?

## 2021-11-16 NOTE — Telephone Encounter (Addendum)
I spoke to the patient. She verbalized understanding of the EEG findings.  ?

## 2021-11-17 ENCOUNTER — Ambulatory Visit
Admit: 2021-11-17 | Discharge: 2021-11-18 | Payer: BLUE CROSS/BLUE SHIELD | Attending: Physician Assistant | Primary: Physician Assistant

## 2021-11-17 DIAGNOSIS — L853 Xerosis cutis: Principal | ICD-10-CM

## 2021-11-17 DIAGNOSIS — R231 Pallor: Principal | ICD-10-CM

## 2021-11-17 DIAGNOSIS — L719 Rosacea, unspecified: Principal | ICD-10-CM

## 2021-11-17 DIAGNOSIS — L709 Acne, unspecified: Principal | ICD-10-CM

## 2021-11-17 MED ORDER — TRETINOIN 0.025 % TOPICAL CREAM
3 refills | 0 days | Status: CP
Start: 2021-11-17 — End: ?

## 2021-11-29 MED ORDER — DEXTROAMPHETAMINE-AMPHETAMINE ER 20 MG 24HR CAPSULE,EXTEND RELEASE
ORAL_CAPSULE | Freq: Every morning | ORAL | 0 refills | 30.00000 days | Status: CP
Start: 2021-11-29 — End: 2021-11-29

## 2021-11-30 ENCOUNTER — Telehealth
Admit: 2021-11-30 | Discharge: 2021-12-01 | Payer: BLUE CROSS/BLUE SHIELD | Attending: Student in an Organized Health Care Education/Training Program | Primary: Student in an Organized Health Care Education/Training Program

## 2021-11-30 MED ORDER — BUPROPION HCL XL 150 MG 24 HR TABLET, EXTENDED RELEASE
ORAL_TABLET | Freq: Every day | ORAL | 0 refills | 30 days | Status: CP
Start: 2021-11-30 — End: 2021-12-30

## 2021-11-30 MED ORDER — DEXTROAMPHETAMINE-AMPHETAMINE ER 20 MG 24HR CAPSULE,EXTEND RELEASE
ORAL_CAPSULE | Freq: Every morning | ORAL | 0 refills | 30 days | Status: CP
Start: 2021-11-30 — End: 2021-12-30

## 2021-11-30 MED ORDER — DEXTROAMPHETAMINE-AMPHETAMINE 10 MG TABLET
ORAL_TABLET | Freq: Every evening | ORAL | 0 refills | 29 days | Status: CP
Start: 2021-11-30 — End: 2021-12-29

## 2021-12-01 MED ORDER — DEXTROAMPHETAMINE-AMPHETAMINE ER 20 MG 24HR CAPSULE,EXTEND RELEASE
ORAL_CAPSULE | Freq: Every morning | ORAL | 0 refills | 30 days | Status: CP
Start: 2021-12-01 — End: 2021-12-31

## 2021-12-01 MED ORDER — DEXTROAMPHETAMINE-AMPHETAMINE 10 MG TABLET
ORAL_TABLET | Freq: Every evening | ORAL | 0 refills | 29 days
Start: 2021-12-01 — End: 2021-12-30

## 2021-12-03 ENCOUNTER — Ambulatory Visit: Admit: 2021-12-03 | Discharge: 2021-12-04 | Payer: BLUE CROSS/BLUE SHIELD

## 2021-12-05 DIAGNOSIS — K50018 Crohn's disease of small intestine with other complication: Principal | ICD-10-CM

## 2021-12-05 DIAGNOSIS — K610 Anal abscess: Principal | ICD-10-CM

## 2021-12-05 DIAGNOSIS — K50818 Crohn's disease of both small and large intestine with other complication: Principal | ICD-10-CM

## 2021-12-05 MED ORDER — CIPROFLOXACIN 250 MG TABLET
ORAL_TABLET | Freq: Two times a day (BID) | ORAL | 0 refills | 30 days
Start: 2021-12-05 — End: ?

## 2021-12-05 MED ORDER — ESOMEPRAZOLE MAGNESIUM 40 MG CAPSULE,DELAYED RELEASE
ORAL_CAPSULE | Freq: Two times a day (BID) | ORAL | 5 refills | 30 days
Start: 2021-12-05 — End: 2022-12-05

## 2021-12-05 MED ORDER — ERGOCALCIFEROL (VITAMIN D2) 1,250 MCG (50,000 UNIT) CAPSULE
ORAL_CAPSULE | 1 refills | 0 days
Start: 2021-12-05 — End: ?

## 2021-12-05 MED ORDER — METRONIDAZOLE 250 MG TABLET
ORAL_TABLET | Freq: Two times a day (BID) | ORAL | 0 refills | 30 days
Start: 2021-12-05 — End: ?

## 2021-12-07 MED FILL — MAGNESIUM OXIDE 400 MG (241.3 MG MAGNESIUM) TABLET: ORAL | 30 days supply | Qty: 60 | Fill #3

## 2021-12-07 MED FILL — ALPRAZOLAM 1 MG TABLET: ORAL | 30 days supply | Qty: 60 | Fill #1

## 2021-12-07 MED FILL — AMMONIUM LACTATE 12 % TOPICAL CREAM: 30 days supply | Qty: 385 | Fill #3

## 2021-12-07 MED FILL — FAMOTIDINE 40 MG TABLET: ORAL | 90 days supply | Qty: 90 | Fill #1

## 2021-12-08 MED ORDER — ERGOCALCIFEROL (VITAMIN D2) 1,250 MCG (50,000 UNIT) CAPSULE
ORAL_CAPSULE | 1 refills | 0 days
Start: 2021-12-08 — End: ?

## 2021-12-09 MED ORDER — ESOMEPRAZOLE MAGNESIUM 40 MG CAPSULE,DELAYED RELEASE
ORAL_CAPSULE | Freq: Two times a day (BID) | ORAL | 5 refills | 30 days | Status: CP
Start: 2021-12-09 — End: 2022-12-09
  Filled 2021-12-09: qty 60, 30d supply, fill #0

## 2021-12-09 MED ORDER — METRONIDAZOLE 250 MG TABLET
ORAL_TABLET | Freq: Two times a day (BID) | ORAL | 0 refills | 30 days | Status: CP
Start: 2021-12-09 — End: ?
  Filled 2021-12-09: qty 60, 30d supply, fill #0

## 2021-12-09 MED ORDER — CIPROFLOXACIN 250 MG TABLET
ORAL_TABLET | Freq: Two times a day (BID) | ORAL | 0 refills | 30 days | Status: CP
Start: 2021-12-09 — End: ?
  Filled 2021-12-09: qty 60, 30d supply, fill #0

## 2021-12-20 MED FILL — CYANOCOBALAMIN (VIT B-12) 1,000 MCG/ML INJECTION SOLUTION: SUBCUTANEOUS | 28 days supply | Qty: 2 | Fill #5

## 2021-12-22 ENCOUNTER — Ambulatory Visit: Admit: 2021-12-22 | Discharge: 2021-12-23 | Payer: BLUE CROSS/BLUE SHIELD

## 2021-12-22 DIAGNOSIS — R569 Unspecified convulsions: Principal | ICD-10-CM

## 2021-12-22 DIAGNOSIS — Z13220 Encounter for screening for lipoid disorders: Principal | ICD-10-CM

## 2021-12-22 DIAGNOSIS — G43009 Migraine without aura, not intractable, without status migrainosus: Principal | ICD-10-CM

## 2021-12-22 DIAGNOSIS — G9332 Chronic fatigue syndrome with fibromyalgia: Principal | ICD-10-CM

## 2021-12-22 DIAGNOSIS — E559 Vitamin D deficiency, unspecified: Principal | ICD-10-CM

## 2021-12-22 DIAGNOSIS — M792 Neuralgia and neuritis, unspecified: Principal | ICD-10-CM

## 2021-12-22 DIAGNOSIS — F909 Attention-deficit hyperactivity disorder, unspecified type: Principal | ICD-10-CM

## 2021-12-22 DIAGNOSIS — H04129 Dry eye syndrome of unspecified lacrimal gland: Principal | ICD-10-CM

## 2021-12-22 DIAGNOSIS — L309 Dermatitis, unspecified: Principal | ICD-10-CM

## 2021-12-22 DIAGNOSIS — Z0000 Encounter for general adult medical examination without abnormal findings: Principal | ICD-10-CM

## 2021-12-22 DIAGNOSIS — J309 Allergic rhinitis, unspecified: Principal | ICD-10-CM

## 2021-12-22 DIAGNOSIS — M797 Fibromyalgia: Principal | ICD-10-CM

## 2021-12-22 DIAGNOSIS — L719 Rosacea, unspecified: Principal | ICD-10-CM

## 2021-12-22 DIAGNOSIS — D509 Iron deficiency anemia, unspecified: Principal | ICD-10-CM

## 2021-12-22 DIAGNOSIS — K50018 Crohn's disease of small intestine with other complication: Principal | ICD-10-CM

## 2021-12-22 DIAGNOSIS — G894 Chronic pain syndrome: Principal | ICD-10-CM

## 2021-12-22 DIAGNOSIS — F411 Generalized anxiety disorder: Principal | ICD-10-CM

## 2021-12-22 DIAGNOSIS — K5909 Other constipation: Principal | ICD-10-CM

## 2021-12-22 DIAGNOSIS — Z720 Tobacco use: Principal | ICD-10-CM

## 2021-12-24 MED ORDER — DEXTROAMPHETAMINE-AMPHETAMINE ER 20 MG 24HR CAPSULE,EXTEND RELEASE
ORAL_CAPSULE | Freq: Every morning | ORAL | 0 refills | 30 days | Status: CP
Start: 2021-12-24 — End: 2022-01-23

## 2021-12-29 MED ORDER — DEXTROAMPHETAMINE-AMPHETAMINE ER 20 MG 24HR CAPSULE,EXTEND RELEASE
ORAL_CAPSULE | Freq: Every morning | ORAL | 0 refills | 30 days | Status: CP
Start: 2021-12-29 — End: 2022-01-28

## 2022-01-04 MED ORDER — ERGOCALCIFEROL (VITAMIN D2) 1,250 MCG (50,000 UNIT) CAPSULE
ORAL_CAPSULE | 1 refills | 0 days
Start: 2022-01-04 — End: ?

## 2022-01-04 MED ORDER — POTASSIUM CHLORIDE ER 20 MEQ TABLET,EXTENDED RELEASE(PART/CRYST)
ORAL_TABLET | Freq: Every day | ORAL | 2 refills | 30 days
Start: 2022-01-04 — End: ?

## 2022-01-05 MED ORDER — ERGOCALCIFEROL (VITAMIN D2) 1,250 MCG (50,000 UNIT) CAPSULE
ORAL_CAPSULE | 1 refills | 0 days
Start: 2022-01-05 — End: ?

## 2022-01-10 MED ORDER — POTASSIUM CHLORIDE ER 20 MEQ TABLET,EXTENDED RELEASE(PART/CRYST)
ORAL_TABLET | Freq: Every day | ORAL | 2 refills | 30 days | Status: CP
Start: 2022-01-10 — End: ?
  Filled 2022-01-11: qty 30, 30d supply, fill #0

## 2022-01-11 MED ORDER — ALPRAZOLAM 1 MG TABLET
ORAL_TABLET | Freq: Two times a day (BID) | ORAL | 3 refills | 30 days | Status: CP | PRN
Start: 2022-01-11 — End: ?

## 2022-01-11 MED ORDER — MAGNESIUM OXIDE 400 MG (241.3 MG MAGNESIUM) TABLET
ORAL_TABLET | Freq: Two times a day (BID) | ORAL | 11 refills | 30 days | Status: CP
Start: 2022-01-11 — End: ?
  Filled 2022-01-11: qty 60, 30d supply, fill #4
  Filled 2022-03-11: qty 60, 30d supply, fill #0

## 2022-01-11 MED FILL — CYANOCOBALAMIN (VIT B-12) 1,000 MCG/ML INJECTION SOLUTION: SUBCUTANEOUS | 28 days supply | Qty: 2 | Fill #6

## 2022-01-11 MED FILL — AMMONIUM LACTATE 12 % TOPICAL CREAM: 30 days supply | Qty: 385 | Fill #4

## 2022-01-11 MED FILL — RESTASIS 0.05 % EYE DROPS IN A DROPPERETTE: OPHTHALMIC | 90 days supply | Qty: 180 | Fill #1

## 2022-01-24 MED ORDER — DEXTROAMPHETAMINE-AMPHETAMINE ER 20 MG 24HR CAPSULE,EXTEND RELEASE
ORAL_CAPSULE | Freq: Every morning | ORAL | 0 refills | 30 days | Status: CP
Start: 2022-01-24 — End: 2022-02-23

## 2022-01-25 ENCOUNTER — Telehealth: Payer: Self-pay | Admitting: Neurology

## 2022-01-25 ENCOUNTER — Telehealth
Admit: 2022-01-25 | Discharge: 2022-01-26 | Payer: BLUE CROSS/BLUE SHIELD | Attending: Student in an Organized Health Care Education/Training Program | Primary: Student in an Organized Health Care Education/Training Program

## 2022-01-25 DIAGNOSIS — K50018 Crohn's disease of small intestine with other complication: Principal | ICD-10-CM

## 2022-01-25 DIAGNOSIS — K610 Anal abscess: Principal | ICD-10-CM

## 2022-01-25 DIAGNOSIS — K50818 Crohn's disease of both small and large intestine with other complication: Principal | ICD-10-CM

## 2022-01-25 MED ORDER — CIPROFLOXACIN 250 MG TABLET
ORAL_TABLET | Freq: Two times a day (BID) | ORAL | 0 refills | 30 days
Start: 2022-01-25 — End: ?

## 2022-01-25 MED ORDER — METRONIDAZOLE 250 MG TABLET
ORAL_TABLET | Freq: Two times a day (BID) | ORAL | 0 refills | 30 days
Start: 2022-01-25 — End: ?

## 2022-01-25 NOTE — Telephone Encounter (Signed)
Young female Patient of Dr Jannifer Franklin' with chronic pain, ill defined symptoms, and normal NCV/ EMG, ;abs, rheumatological work up, MRI brain, EEGs.  Established with psychiatry and with pain management .  All other specialists are with [facility].   The diagnosis of myalgia is not a neurological one and I do not practice in the scope of pain management.  Please inform this former patient of [facility] Dr Kristine Garbe I agree with psychiatry ( Dr Fontaine No)  to start her on XR Wellbutrin , which has a minimal risk to increase seizures in comparison to IR forms.  At [facility], we  cannot provide pain care, fibromyalgia or non-epileptic seizure treatment.   I wish the patient very well, and encourage follow up with pain management and primary care.

## 2022-01-26 MED ORDER — BUPROPION HCL XL 150 MG 24 HR TABLET, EXTENDED RELEASE
ORAL_TABLET | Freq: Every morning | ORAL | 3 refills | 30 days | Status: CP
Start: 2022-01-26 — End: 2022-05-26

## 2022-01-26 NOTE — Telephone Encounter (Signed)
Called pt back. Relayed message. She will f/u with psychiatry/pain management moving forward. Appt cx on 02/01/22.

## 2022-01-27 MED ORDER — METRONIDAZOLE 250 MG TABLET
ORAL_TABLET | Freq: Two times a day (BID) | ORAL | 0 refills | 30 days | Status: CP
Start: 2022-01-27 — End: ?
  Filled 2022-01-28: qty 60, 30d supply, fill #0

## 2022-01-27 MED ORDER — CIPROFLOXACIN 250 MG TABLET
ORAL_TABLET | Freq: Two times a day (BID) | ORAL | 0 refills | 30 days | Status: CP
Start: 2022-01-27 — End: ?
  Filled 2022-01-28: qty 60, 30d supply, fill #0

## 2022-02-01 ENCOUNTER — Ambulatory Visit: Payer: Medicaid Other | Admitting: Neurology

## 2022-02-19 MED ORDER — BUPROPION HCL XL 150 MG 24 HR TABLET, EXTENDED RELEASE
ORAL_TABLET | 2 refills | 0 days
Start: 2022-02-19 — End: ?

## 2022-02-21 MED ORDER — DEXTROAMPHETAMINE-AMPHETAMINE ER 20 MG 24HR CAPSULE,EXTEND RELEASE
ORAL_CAPSULE | Freq: Every morning | ORAL | 0 refills | 30 days | Status: CP
Start: 2022-02-21 — End: 2022-03-23

## 2022-02-21 MED ORDER — ALPRAZOLAM 1 MG TABLET
ORAL_TABLET | Freq: Two times a day (BID) | ORAL | 3 refills | 30 days | Status: CP | PRN
Start: 2022-02-21 — End: ?

## 2022-02-21 MED ORDER — BUPROPION HCL XL 150 MG 24 HR TABLET, EXTENDED RELEASE
ORAL_TABLET | Freq: Every morning | ORAL | 2 refills | 0.00000 days | Status: CP
Start: 2022-02-21 — End: 2022-06-21

## 2022-02-23 MED ORDER — DEXTROAMPHETAMINE-AMPHETAMINE ER 20 MG 24HR CAPSULE,EXTEND RELEASE
ORAL_CAPSULE | Freq: Every morning | ORAL | 0 refills | 30 days | Status: CP
Start: 2022-02-23 — End: 2022-03-25

## 2022-03-01 MED ORDER — DEXTROAMPHETAMINE-AMPHETAMINE 10 MG TABLET
ORAL_TABLET | Freq: Every evening | ORAL | 0 refills | 29 days
Start: 2022-03-01 — End: 2022-03-30

## 2022-03-08 DIAGNOSIS — K610 Anal abscess: Principal | ICD-10-CM

## 2022-03-08 DIAGNOSIS — K50818 Crohn's disease of both small and large intestine with other complication: Principal | ICD-10-CM

## 2022-03-08 DIAGNOSIS — K50018 Crohn's disease of small intestine with other complication: Principal | ICD-10-CM

## 2022-03-08 MED ORDER — ONDANSETRON 8 MG DISINTEGRATING TABLET
ORAL_TABLET | Freq: Three times a day (TID) | ORAL | 2 refills | 20 days | PRN
Start: 2022-03-08 — End: ?

## 2022-03-08 MED ORDER — AZATHIOPRINE 50 MG TABLET
ORAL_TABLET | Freq: Every day | ORAL | 0 refills | 90 days
Start: 2022-03-08 — End: ?

## 2022-03-08 MED ORDER — METRONIDAZOLE 250 MG TABLET
ORAL_TABLET | Freq: Two times a day (BID) | ORAL | 0 refills | 30 days
Start: 2022-03-08 — End: ?

## 2022-03-08 MED ORDER — CIPROFLOXACIN 250 MG TABLET
ORAL_TABLET | Freq: Two times a day (BID) | ORAL | 0 refills | 30 days
Start: 2022-03-08 — End: ?

## 2022-03-08 MED ORDER — CYANOCOBALAMIN (VIT B-12) 1,000 MCG/ML INJECTION SOLUTION
SUBCUTANEOUS | 3 refills | 84 days
Start: 2022-03-08 — End: 2023-03-08

## 2022-03-10 MED ORDER — CIPROFLOXACIN 250 MG TABLET
ORAL_TABLET | Freq: Two times a day (BID) | ORAL | 0 refills | 30 days | Status: CP
Start: 2022-03-10 — End: ?
  Filled 2022-03-11: qty 60, 30d supply, fill #0

## 2022-03-10 MED ORDER — ONDANSETRON 8 MG DISINTEGRATING TABLET
ORAL_TABLET | Freq: Three times a day (TID) | ORAL | 2 refills | 20 days | Status: CP | PRN
Start: 2022-03-10 — End: ?
  Filled 2022-03-11: qty 60, 20d supply, fill #0

## 2022-03-10 MED ORDER — AZATHIOPRINE 50 MG TABLET
ORAL_TABLET | Freq: Every day | ORAL | 0 refills | 30 days | Status: CP
Start: 2022-03-10 — End: 2023-03-10
  Filled 2022-03-11: qty 60, 30d supply, fill #0

## 2022-03-10 MED ORDER — CYANOCOBALAMIN (VIT B-12) 1,000 MCG/ML INJECTION SOLUTION
SUBCUTANEOUS | 3 refills | 84 days | Status: CP
Start: 2022-03-10 — End: 2023-03-10
  Filled 2022-03-11: qty 2, 28d supply, fill #0

## 2022-03-10 MED ORDER — METRONIDAZOLE 250 MG TABLET
ORAL_TABLET | Freq: Two times a day (BID) | ORAL | 0 refills | 30 days | Status: CP
Start: 2022-03-10 — End: ?
  Filled 2022-03-11: qty 60, 30d supply, fill #0

## 2022-03-11 MED FILL — AMMONIUM LACTATE 12 % TOPICAL CREAM: 30 days supply | Qty: 385 | Fill #5

## 2022-03-11 MED FILL — ESOMEPRAZOLE MAGNESIUM 40 MG CAPSULE,DELAYED RELEASE: ORAL | 30 days supply | Qty: 60 | Fill #1

## 2022-03-11 MED FILL — KLOR-CON M20 MEQ TABLET,EXTENDED RELEASE: ORAL | 30 days supply | Qty: 30 | Fill #1

## 2022-04-09 DIAGNOSIS — K610 Anal abscess: Principal | ICD-10-CM

## 2022-04-09 DIAGNOSIS — K50018 Crohn's disease of small intestine with other complication: Principal | ICD-10-CM

## 2022-04-09 DIAGNOSIS — K50818 Crohn's disease of both small and large intestine with other complication: Principal | ICD-10-CM

## 2022-04-09 MED ORDER — AZATHIOPRINE 50 MG TABLET
ORAL_TABLET | Freq: Every day | ORAL | 0 refills | 30 days
Start: 2022-04-09 — End: 2023-04-09

## 2022-04-09 MED ORDER — METRONIDAZOLE 250 MG TABLET
ORAL_TABLET | Freq: Two times a day (BID) | ORAL | 0 refills | 30 days
Start: 2022-04-09 — End: ?

## 2022-04-09 MED ORDER — CIPROFLOXACIN 250 MG TABLET
ORAL_TABLET | Freq: Two times a day (BID) | ORAL | 0 refills | 30 days
Start: 2022-04-09 — End: ?

## 2022-04-09 MED ORDER — FAMOTIDINE 40 MG TABLET
ORAL_TABLET | Freq: Two times a day (BID) | ORAL | 1 refills | 90 days
Start: 2022-04-09 — End: 2023-04-09

## 2022-04-12 MED ORDER — CIPROFLOXACIN 250 MG TABLET
ORAL_TABLET | Freq: Two times a day (BID) | ORAL | 0 refills | 30 days | Status: CP
Start: 2022-04-12 — End: ?
  Filled 2022-04-13: qty 60, 30d supply, fill #0

## 2022-04-12 MED ORDER — ESOMEPRAZOLE MAGNESIUM 40 MG CAPSULE,DELAYED RELEASE
ORAL_CAPSULE | Freq: Two times a day (BID) | ORAL | 5 refills | 30 days | Status: CP
Start: 2022-04-12 — End: 2023-04-12
  Filled 2022-04-13: qty 60, 30d supply, fill #0

## 2022-04-12 MED ORDER — METRONIDAZOLE 250 MG TABLET
ORAL_TABLET | Freq: Two times a day (BID) | ORAL | 0 refills | 30 days | Status: CP
Start: 2022-04-12 — End: ?
  Filled 2022-04-13: qty 60, 30d supply, fill #0

## 2022-04-12 MED ORDER — AZATHIOPRINE 50 MG TABLET
ORAL_TABLET | Freq: Every day | ORAL | 1 refills | 90 days | Status: CP
Start: 2022-04-12 — End: 2023-04-12
  Filled 2022-04-13: qty 180, 90d supply, fill #0

## 2022-04-12 MED ORDER — POTASSIUM CHLORIDE ER 20 MEQ TABLET,EXTENDED RELEASE(PART/CRYST)
ORAL_TABLET | Freq: Every day | ORAL | 2 refills | 30 days | Status: CP
Start: 2022-04-12 — End: ?
  Filled 2022-04-13: qty 27, 27d supply, fill #0

## 2022-04-13 MED ORDER — FAMOTIDINE 40 MG TABLET
ORAL_TABLET | Freq: Two times a day (BID) | ORAL | 1 refills | 90 days | Status: CP
Start: 2022-04-13 — End: 2023-04-13
  Filled 2022-04-13: qty 90, 90d supply, fill #0

## 2022-04-13 MED FILL — ONDANSETRON 8 MG DISINTEGRATING TABLET: ORAL | 20 days supply | Qty: 60 | Fill #1

## 2022-04-13 MED FILL — RESTASIS 0.05 % EYE DROPS IN A DROPPERETTE: OPHTHALMIC | 90 days supply | Qty: 180 | Fill #2

## 2022-04-13 MED FILL — MAGNESIUM OXIDE 400 MG (241.3 MG MAGNESIUM) TABLET: ORAL | 30 days supply | Qty: 60 | Fill #1

## 2022-04-13 MED FILL — CYANOCOBALAMIN (VIT B-12) 1,000 MCG/ML INJECTION SOLUTION: SUBCUTANEOUS | 28 days supply | Qty: 2 | Fill #1

## 2022-04-18 MED ORDER — DEXTROAMPHETAMINE-AMPHETAMINE ER 20 MG 24HR CAPSULE,EXTEND RELEASE
ORAL_CAPSULE | Freq: Every morning | ORAL | 0 refills | 30.00000 days | Status: CN
Start: 2022-04-18 — End: 2022-05-18

## 2022-04-19 ENCOUNTER — Telehealth: Admit: 2022-04-19 | Discharge: 2022-04-20 | Payer: BLUE CROSS/BLUE SHIELD

## 2022-04-19 DIAGNOSIS — G894 Chronic pain syndrome: Principal | ICD-10-CM

## 2022-04-19 DIAGNOSIS — F909 Attention-deficit hyperactivity disorder, unspecified type: Principal | ICD-10-CM

## 2022-04-19 DIAGNOSIS — Z79899 Other long term (current) drug therapy: Principal | ICD-10-CM

## 2022-04-19 DIAGNOSIS — F419 Anxiety disorder, unspecified: Principal | ICD-10-CM

## 2022-04-19 DIAGNOSIS — G479 Sleep disorder, unspecified: Principal | ICD-10-CM

## 2022-04-19 DIAGNOSIS — F4310 Post-traumatic stress disorder, unspecified: Principal | ICD-10-CM

## 2022-04-19 DIAGNOSIS — F17200 Nicotine dependence, unspecified, uncomplicated: Principal | ICD-10-CM

## 2022-04-19 DIAGNOSIS — F900 Attention-deficit hyperactivity disorder, predominantly inattentive type: Principal | ICD-10-CM

## 2022-04-19 MED ORDER — NICOTINE 14 MG/24 HR DAILY TRANSDERMAL PATCH
MEDICATED_PATCH | TRANSDERMAL | 0 refills | 90 days | Status: CP
Start: 2022-04-19 — End: 2022-07-18

## 2022-04-19 MED ORDER — ALPRAZOLAM 1 MG TABLET
ORAL_TABLET | Freq: Two times a day (BID) | ORAL | 0 refills | 90 days | Status: CP | PRN
Start: 2022-04-19 — End: 2022-07-18

## 2022-04-19 MED ORDER — NICOTINE (POLACRILEX) 2 MG BUCCAL LOZENGE
BUCCAL | 3 refills | 9 days | Status: CP | PRN
Start: 2022-04-19 — End: 2022-07-18

## 2022-04-19 MED ORDER — BUPROPION HCL SR 150 MG TABLET,12 HR SUSTAINED-RELEASE
ORAL_TABLET | Freq: Every day | ORAL | 3 refills | 90 days | Status: CP
Start: 2022-04-19 — End: 2023-04-19

## 2022-04-19 MED ORDER — PRAZOSIN 2 MG CAPSULE
ORAL_CAPSULE | ORAL | 3 refills | 28 days | Status: CP
Start: 2022-04-19 — End: 2022-07-18

## 2022-04-19 MED ORDER — DEXTROAMPHETAMINE-AMPHETAMINE ER 20 MG 24HR CAPSULE,EXTEND RELEASE
ORAL_CAPSULE | Freq: Every morning | ORAL | 0 refills | 30 days | Status: CP
Start: 2022-04-19 — End: 2022-05-19

## 2022-04-26 ENCOUNTER — Ambulatory Visit: Admit: 2022-04-26 | Discharge: 2022-04-27 | Payer: BLUE CROSS/BLUE SHIELD

## 2022-04-28 MED ORDER — ALPRAZOLAM 1 MG TABLET
ORAL_TABLET | Freq: Two times a day (BID) | ORAL | 0 refills | 90 days | PRN
Start: 2022-04-28 — End: 2022-07-27

## 2022-04-29 MED ORDER — ALPRAZOLAM 1 MG TABLET
ORAL_TABLET | Freq: Two times a day (BID) | ORAL | 0 refills | 90 days | PRN
Start: 2022-04-29 — End: 2022-07-28

## 2022-05-12 DIAGNOSIS — K610 Anal abscess: Principal | ICD-10-CM

## 2022-05-12 DIAGNOSIS — K50818 Crohn's disease of both small and large intestine with other complication: Principal | ICD-10-CM

## 2022-05-12 DIAGNOSIS — L853 Xerosis cutis: Principal | ICD-10-CM

## 2022-05-12 MED ORDER — AMMONIUM LACTATE 12 % TOPICAL CREAM
5 refills | 0.00000 days | Status: CP
Start: 2022-05-12 — End: ?
  Filled 2022-05-13: qty 385, 30d supply, fill #0

## 2022-05-12 MED ORDER — METRONIDAZOLE 250 MG TABLET
ORAL_TABLET | Freq: Two times a day (BID) | ORAL | 0 refills | 30 days | Status: CP
Start: 2022-05-12 — End: ?
  Filled 2022-05-13: qty 60, 30d supply, fill #0

## 2022-05-12 MED ORDER — CIPROFLOXACIN 250 MG TABLET
ORAL_TABLET | Freq: Two times a day (BID) | ORAL | 0 refills | 30 days | Status: CP
Start: 2022-05-12 — End: ?
  Filled 2022-05-13: qty 60, 30d supply, fill #0

## 2022-05-12 MED ORDER — ALPRAZOLAM 1 MG TABLET
ORAL_TABLET | Freq: Two times a day (BID) | ORAL | 0 refills | 90 days | PRN
Start: 2022-05-12 — End: 2022-08-10

## 2022-05-12 MED ORDER — DEXTROAMPHETAMINE-AMPHETAMINE ER 20 MG 24HR CAPSULE,EXTEND RELEASE
ORAL_CAPSULE | Freq: Every morning | ORAL | 0 refills | 30 days
Start: 2022-05-12 — End: 2022-06-11

## 2022-05-12 MED ORDER — BD LUER-LOK SYRINGE 3 ML 25 X 5/8"
2 refills | 350 days | Status: CP
Start: 2022-05-12 — End: ?
  Filled 2022-05-13: qty 2, 28d supply, fill #0

## 2022-05-13 MED ORDER — ALPRAZOLAM 1 MG TABLET
ORAL_TABLET | Freq: Two times a day (BID) | ORAL | 0 refills | 90 days | PRN
Start: 2022-05-13 — End: 2022-08-11

## 2022-05-13 MED ORDER — DEXTROAMPHETAMINE-AMPHETAMINE ER 20 MG 24HR CAPSULE,EXTEND RELEASE
ORAL_CAPSULE | Freq: Every morning | ORAL | 0 refills | 30 days
Start: 2022-05-13 — End: 2022-06-12

## 2022-05-13 MED FILL — ESOMEPRAZOLE MAGNESIUM 40 MG CAPSULE,DELAYED RELEASE: ORAL | 30 days supply | Qty: 60 | Fill #1

## 2022-05-13 MED FILL — ONDANSETRON 8 MG DISINTEGRATING TABLET: ORAL | 20 days supply | Qty: 60 | Fill #2

## 2022-05-13 MED FILL — CYANOCOBALAMIN (VIT B-12) 1,000 MCG/ML INJECTION SOLUTION: SUBCUTANEOUS | 28 days supply | Qty: 2 | Fill #2

## 2022-05-13 MED FILL — MAGNESIUM OXIDE 400 MG (241.3 MG MAGNESIUM) TABLET: ORAL | 30 days supply | Qty: 60 | Fill #2

## 2022-05-13 MED FILL — KLOR-CON M20 MEQ TABLET,EXTENDED RELEASE: ORAL | 30 days supply | Qty: 30 | Fill #1

## 2022-05-17 ENCOUNTER — Telehealth: Admit: 2022-05-17 | Discharge: 2022-05-18 | Payer: BLUE CROSS/BLUE SHIELD

## 2022-05-17 DIAGNOSIS — G479 Sleep disorder, unspecified: Principal | ICD-10-CM

## 2022-05-17 DIAGNOSIS — G894 Chronic pain syndrome: Principal | ICD-10-CM

## 2022-05-17 DIAGNOSIS — F17200 Nicotine dependence, unspecified, uncomplicated: Principal | ICD-10-CM

## 2022-05-17 DIAGNOSIS — F4310 Post-traumatic stress disorder, unspecified: Principal | ICD-10-CM

## 2022-05-17 MED ORDER — NICOTINE (POLACRILEX) 2 MG BUCCAL LOZENGE
BUCCAL | 3 refills | 9.00000 days | Status: CP | PRN
Start: 2022-05-17 — End: 2022-05-17

## 2022-05-17 MED ORDER — ALPRAZOLAM 1 MG TABLET
ORAL_TABLET | Freq: Every day | ORAL | 0 refills | 30.00000 days | Status: CP | PRN
Start: 2022-05-17 — End: 2022-05-17

## 2022-05-17 MED ORDER — DEXTROAMPHETAMINE-AMPHETAMINE ER 20 MG 24HR CAPSULE,EXTEND RELEASE
ORAL_CAPSULE | Freq: Every morning | ORAL | 0 refills | 30 days | Status: CP
Start: 2022-05-17 — End: 2022-06-16

## 2022-05-17 MED ORDER — NICOTINE 21 MG/24 HR DAILY TRANSDERMAL PATCH
MEDICATED_PATCH | TRANSDERMAL | 3 refills | 28.00000 days | Status: CP
Start: 2022-05-17 — End: 2022-05-17

## 2022-06-14 DIAGNOSIS — K610 Anal abscess: Principal | ICD-10-CM

## 2022-06-14 DIAGNOSIS — K50818 Crohn's disease of both small and large intestine with other complication: Principal | ICD-10-CM

## 2022-06-14 MED ORDER — METRONIDAZOLE 250 MG TABLET
ORAL_TABLET | Freq: Two times a day (BID) | ORAL | 0 refills | 30 days | Status: CP
Start: 2022-06-14 — End: ?
  Filled 2022-06-14: qty 60, 30d supply, fill #0

## 2022-06-14 MED ORDER — CIPROFLOXACIN 250 MG TABLET
ORAL_TABLET | Freq: Two times a day (BID) | ORAL | 0 refills | 30 days | Status: CP
Start: 2022-06-14 — End: ?
  Filled 2022-06-14: qty 60, 30d supply, fill #0

## 2022-06-14 MED ORDER — ONDANSETRON 8 MG DISINTEGRATING TABLET
ORAL_TABLET | Freq: Three times a day (TID) | ORAL | 0 refills | 20 days | Status: CP | PRN
Start: 2022-06-14 — End: ?
  Filled 2022-06-14: qty 60, 20d supply, fill #0

## 2022-06-14 MED ORDER — DEXTROAMPHETAMINE-AMPHETAMINE ER 20 MG 24HR CAPSULE,EXTEND RELEASE
ORAL_CAPSULE | Freq: Every morning | ORAL | 0 refills | 30 days | Status: CP
Start: 2022-06-14 — End: 2022-07-14

## 2022-06-14 MED FILL — KLOR-CON M20 MEQ TABLET,EXTENDED RELEASE: ORAL | 30 days supply | Qty: 30 | Fill #2

## 2022-06-14 MED FILL — CYANOCOBALAMIN (VIT B-12) 1,000 MCG/ML INJECTION SOLUTION: SUBCUTANEOUS | 28 days supply | Qty: 2 | Fill #3

## 2022-06-14 MED FILL — MAGNESIUM OXIDE 400 MG (241.3 MG MAGNESIUM) TABLET: ORAL | 30 days supply | Qty: 60 | Fill #3

## 2022-06-14 MED FILL — ESOMEPRAZOLE MAGNESIUM 40 MG CAPSULE,DELAYED RELEASE: ORAL | 30 days supply | Qty: 60 | Fill #2

## 2022-06-15 MED ORDER — DEXTROAMPHETAMINE-AMPHETAMINE ER 20 MG 24HR CAPSULE,EXTEND RELEASE
ORAL_CAPSULE | Freq: Every morning | ORAL | 0 refills | 30 days | Status: CP
Start: 2022-06-15 — End: 2022-07-15

## 2022-06-20 ENCOUNTER — Ambulatory Visit: Admit: 2022-06-20 | Payer: BLUE CROSS/BLUE SHIELD | Attending: Psychologist | Primary: Psychologist

## 2022-06-24 ENCOUNTER — Ambulatory Visit: Admit: 2022-06-24 | Discharge: 2022-06-25 | Payer: BLUE CROSS/BLUE SHIELD

## 2022-06-24 DIAGNOSIS — K50018 Crohn's disease of small intestine with other complication: Principal | ICD-10-CM

## 2022-06-28 MED ORDER — NICOTINE (POLACRILEX) 2 MG BUCCAL LOZENGE
BUCCAL | 3 refills | 9 days | PRN
Start: 2022-06-28 — End: 2022-09-26

## 2022-06-28 MED ORDER — NICOTINE 21 MG/24 HR DAILY TRANSDERMAL PATCH
MEDICATED_PATCH | TRANSDERMAL | 2 refills | 28 days
Start: 2022-06-28 — End: ?

## 2022-06-29 MED ORDER — NICOTINE 21 MG/24 HR DAILY TRANSDERMAL PATCH
MEDICATED_PATCH | TRANSDERMAL | 2 refills | 28 days | Status: CP
Start: 2022-06-29 — End: ?

## 2022-06-29 MED ORDER — NICOTINE (POLACRILEX) 2 MG BUCCAL LOZENGE
BUCCAL | 3 refills | 9 days | Status: CP | PRN
Start: 2022-06-29 — End: 2022-09-27

## 2022-06-29 MED FILL — FAMOTIDINE 40 MG TABLET: ORAL | 90 days supply | Qty: 90 | Fill #1

## 2022-07-05 MED ORDER — ALPRAZOLAM 1 MG TABLET
ORAL_TABLET | Freq: Every day | ORAL | 0 refills | 30 days | Status: CP | PRN
Start: 2022-07-05 — End: 2022-08-04

## 2022-07-11 ENCOUNTER — Telehealth: Admit: 2022-07-11 | Discharge: 2022-07-12 | Payer: BLUE CROSS/BLUE SHIELD

## 2022-07-11 DIAGNOSIS — F4310 Post-traumatic stress disorder, unspecified: Principal | ICD-10-CM

## 2022-07-11 DIAGNOSIS — F419 Anxiety disorder, unspecified: Principal | ICD-10-CM

## 2022-07-11 DIAGNOSIS — G894 Chronic pain syndrome: Principal | ICD-10-CM

## 2022-07-11 DIAGNOSIS — F909 Attention-deficit hyperactivity disorder, unspecified type: Principal | ICD-10-CM

## 2022-07-11 DIAGNOSIS — F17200 Nicotine dependence, unspecified, uncomplicated: Principal | ICD-10-CM

## 2022-07-11 MED ORDER — PEG 3350-ELECTROLYTES 236 GRAM-22.74 GRAM-6.74 GRAM-5.86 GRAM SOLUTION
Freq: Once | ORAL | 0 refills | 1 days | Status: CP
Start: 2022-07-11 — End: 2022-07-11

## 2022-07-11 MED ORDER — NICOTINE (POLACRILEX) 2 MG BUCCAL LOZENGE
BUCCAL | 3 refills | 9 days | Status: CP | PRN
Start: 2022-07-11 — End: 2022-10-09

## 2022-07-11 MED ORDER — ALPRAZOLAM 1 MG TABLET
ORAL_TABLET | Freq: Every day | ORAL | 0 refills | 30 days | Status: CP | PRN
Start: 2022-07-11 — End: 2022-08-10

## 2022-07-11 MED ORDER — NICOTINE 14 MG/24 HR DAILY TRANSDERMAL PATCH
MEDICATED_PATCH | TRANSDERMAL | 2 refills | 28 days | Status: CP
Start: 2022-07-11 — End: ?

## 2022-07-11 MED ORDER — BUPROPION HCL SR 150 MG TABLET,12 HR SUSTAINED-RELEASE
ORAL_TABLET | Freq: Two times a day (BID) | ORAL | 2 refills | 30 days | Status: CP
Start: 2022-07-11 — End: 2022-10-09

## 2022-07-11 MED ORDER — DEXTROAMPHETAMINE-AMPHETAMINE ER 20 MG 24HR CAPSULE,EXTEND RELEASE
ORAL_CAPSULE | Freq: Every morning | ORAL | 0 refills | 29 days | Status: CP
Start: 2022-07-11 — End: 2022-08-09

## 2022-07-13 ENCOUNTER — Telehealth: Admit: 2022-07-13 | Discharge: 2022-07-14 | Payer: BLUE CROSS/BLUE SHIELD | Attending: School | Primary: School

## 2022-07-13 MED ORDER — POTASSIUM CHLORIDE ER 20 MEQ TABLET,EXTENDED RELEASE(PART/CRYST)
ORAL_TABLET | Freq: Every day | ORAL | 2 refills | 30 days
Start: 2022-07-13 — End: ?

## 2022-07-13 MED ORDER — ONDANSETRON 8 MG DISINTEGRATING TABLET
ORAL_TABLET | Freq: Three times a day (TID) | ORAL | 0 refills | 20 days | PRN
Start: 2022-07-13 — End: ?

## 2022-07-14 MED ORDER — POTASSIUM CHLORIDE ER 20 MEQ TABLET,EXTENDED RELEASE(PART/CRYST)
ORAL_TABLET | Freq: Every day | ORAL | 2 refills | 30 days | Status: CP
Start: 2022-07-14 — End: ?
  Filled 2022-07-18: qty 4000, 1d supply, fill #0

## 2022-07-14 MED ORDER — ONDANSETRON 8 MG DISINTEGRATING TABLET
ORAL_TABLET | Freq: Three times a day (TID) | ORAL | 0 refills | 20 days | Status: CP | PRN
Start: 2022-07-14 — End: ?
  Filled 2022-07-18: qty 60, 20d supply, fill #0

## 2022-07-14 MED ORDER — DEXTROAMPHETAMINE-AMPHETAMINE ER 20 MG 24HR CAPSULE,EXTEND RELEASE
ORAL_CAPSULE | Freq: Every morning | ORAL | 0 refills | 30.00000 days | Status: CP
Start: 2022-07-14 — End: 2022-08-13

## 2022-07-18 MED FILL — AZATHIOPRINE 50 MG TABLET: ORAL | 90 days supply | Qty: 180 | Fill #1

## 2022-07-18 MED FILL — RESTASIS 0.05 % EYE DROPS IN A DROPPERETTE: OPHTHALMIC | 90 days supply | Qty: 180 | Fill #3

## 2022-07-18 MED FILL — BD LUER-LOK SYRINGE 3 ML 25 X 5/8": 28 days supply | Qty: 2 | Fill #1

## 2022-07-18 MED FILL — AMMONIUM LACTATE 12 % TOPICAL CREAM: 30 days supply | Qty: 385 | Fill #1

## 2022-07-18 MED FILL — MAGNESIUM OXIDE 400 MG (241.3 MG MAGNESIUM) TABLET: ORAL | 30 days supply | Qty: 60 | Fill #4

## 2022-07-18 MED FILL — KLOR-CON M20 MEQ TABLET,EXTENDED RELEASE: ORAL | 30 days supply | Qty: 30 | Fill #0

## 2022-07-18 MED FILL — ESOMEPRAZOLE MAGNESIUM 40 MG CAPSULE,DELAYED RELEASE: ORAL | 30 days supply | Qty: 60 | Fill #3

## 2022-07-18 MED FILL — CYANOCOBALAMIN (VIT B-12) 1,000 MCG/ML INJECTION SOLUTION: SUBCUTANEOUS | 28 days supply | Qty: 2 | Fill #4

## 2022-07-26 DIAGNOSIS — K50018 Crohn's disease of small intestine with other complication: Principal | ICD-10-CM

## 2022-07-26 MED ORDER — ALPRAZOLAM 1 MG TABLET
ORAL_TABLET | Freq: Every day | ORAL | 0 refills | 30 days | PRN
Start: 2022-07-26 — End: 2022-08-25

## 2022-07-26 MED ORDER — CHOLECALCIFEROL (VITAMIN D3) 25 MCG (1,000 UNIT) TABLET
ORAL_TABLET | Freq: Every day | ORAL | 11 refills | 30 days | Status: CP
Start: 2022-07-26 — End: 2023-07-26

## 2022-07-27 MED ORDER — ALPRAZOLAM 1 MG TABLET
ORAL_TABLET | Freq: Every day | ORAL | 0 refills | 30 days | Status: CP | PRN
Start: 2022-07-27 — End: 2022-08-26

## 2022-08-03 MED ORDER — ALPRAZOLAM 1 MG TABLET
ORAL_TABLET | Freq: Every day | ORAL | 0 refills | 30 days | PRN
Start: 2022-08-03 — End: 2022-09-02

## 2022-08-04 MED ORDER — ALPRAZOLAM 1 MG TABLET
ORAL_TABLET | Freq: Every day | ORAL | 0 refills | 30 days | PRN
Start: 2022-08-04 — End: 2022-09-03

## 2022-08-10 MED ORDER — DEXTROAMPHETAMINE-AMPHETAMINE ER 20 MG 24HR CAPSULE,EXTEND RELEASE
ORAL_CAPSULE | Freq: Every morning | ORAL | 0 refills | 29.00000 days | Status: CP
Start: 2022-08-10 — End: 2022-09-08

## 2022-08-17 ENCOUNTER — Telehealth: Admit: 2022-08-17 | Discharge: 2022-08-18 | Payer: BLUE CROSS/BLUE SHIELD | Attending: School | Primary: School

## 2022-08-17 DIAGNOSIS — F909 Attention-deficit hyperactivity disorder, unspecified type: Principal | ICD-10-CM

## 2022-08-17 MED ORDER — ONDANSETRON 8 MG DISINTEGRATING TABLET
ORAL_TABLET | Freq: Three times a day (TID) | ORAL | 0 refills | 20 days | PRN
Start: 2022-08-17 — End: ?

## 2022-08-19 MED ORDER — ONDANSETRON 8 MG DISINTEGRATING TABLET
ORAL_TABLET | Freq: Three times a day (TID) | ORAL | 0 refills | 20 days | Status: CP | PRN
Start: 2022-08-19 — End: ?
  Filled 2022-08-23: qty 60, 20d supply, fill #0

## 2022-08-23 MED FILL — BD LUER-LOK SYRINGE 3 ML 25 X 5/8": 28 days supply | Qty: 2 | Fill #2

## 2022-08-23 MED FILL — CYANOCOBALAMIN (VIT B-12) 1,000 MCG/ML INJECTION SOLUTION: SUBCUTANEOUS | 28 days supply | Qty: 2 | Fill #5

## 2022-08-23 MED FILL — ESOMEPRAZOLE MAGNESIUM 40 MG CAPSULE,DELAYED RELEASE: ORAL | 30 days supply | Qty: 60 | Fill #4

## 2022-08-23 MED FILL — MAGNESIUM OXIDE 400 MG (241.3 MG MAGNESIUM) TABLET: ORAL | 30 days supply | Qty: 60 | Fill #5

## 2022-08-23 MED FILL — AMMONIUM LACTATE 12 % TOPICAL CREAM: 30 days supply | Qty: 385 | Fill #2

## 2022-08-23 MED FILL — KLOR-CON M20 MEQ TABLET,EXTENDED RELEASE: ORAL | 30 days supply | Qty: 30 | Fill #1

## 2022-08-26 MED ORDER — ALPRAZOLAM 1 MG TABLET
ORAL_TABLET | Freq: Every day | ORAL | 0 refills | 30 days | Status: CP | PRN
Start: 2022-08-26 — End: 2022-09-25

## 2022-09-09 MED ORDER — DEXTROAMPHETAMINE-AMPHETAMINE ER 20 MG 24HR CAPSULE,EXTEND RELEASE
ORAL_CAPSULE | Freq: Every morning | ORAL | 0 refills | 29.00000 days | Status: CP
Start: 2022-09-09 — End: 2022-10-08

## 2022-09-16 MED ORDER — CHOLECALCIFEROL (VITAMIN D3) 25 MCG (1,000 UNIT) TABLET
ORAL_TABLET | Freq: Every day | ORAL | 11 refills | 30 days
Start: 2022-09-16 — End: 2023-09-16

## 2022-09-20 ENCOUNTER — Telehealth: Admit: 2022-09-20 | Discharge: 2022-09-21 | Payer: BLUE CROSS/BLUE SHIELD

## 2022-09-20 DIAGNOSIS — F419 Anxiety disorder, unspecified: Principal | ICD-10-CM

## 2022-09-20 DIAGNOSIS — F17200 Nicotine dependence, unspecified, uncomplicated: Principal | ICD-10-CM

## 2022-09-20 DIAGNOSIS — G894 Chronic pain syndrome: Principal | ICD-10-CM

## 2022-09-20 DIAGNOSIS — F909 Attention-deficit hyperactivity disorder, unspecified type: Principal | ICD-10-CM

## 2022-09-20 MED ORDER — ALPRAZOLAM 1 MG TABLET
ORAL_TABLET | Freq: Every day | ORAL | 0 refills | 30 days | Status: CP | PRN
Start: 2022-09-20 — End: 2022-10-20

## 2022-09-20 MED ORDER — DEXTROAMPHETAMINE-AMPHETAMINE ER 20 MG 24HR CAPSULE,EXTEND RELEASE
ORAL_CAPSULE | Freq: Every morning | ORAL | 0 refills | 29 days | Status: CP
Start: 2022-09-20 — End: 2022-10-19

## 2022-09-21 ENCOUNTER — Telehealth: Admit: 2022-09-21 | Discharge: 2022-09-22 | Payer: BLUE CROSS/BLUE SHIELD | Attending: School | Primary: School

## 2022-09-21 DIAGNOSIS — F909 Attention-deficit hyperactivity disorder, unspecified type: Principal | ICD-10-CM

## 2022-09-21 MED FILL — MAGNESIUM OXIDE 400 MG (241.3 MG MAGNESIUM) TABLET: ORAL | 30 days supply | Qty: 60 | Fill #6

## 2022-09-21 MED FILL — AMMONIUM LACTATE 12 % TOPICAL CREAM: 30 days supply | Qty: 385 | Fill #3

## 2022-09-21 MED FILL — KLOR-CON M20 MEQ TABLET,EXTENDED RELEASE: ORAL | 30 days supply | Qty: 30 | Fill #2

## 2022-09-21 MED FILL — BD LUER-LOK SYRINGE 3 ML 25 X 5/8": 28 days supply | Qty: 2 | Fill #3

## 2022-09-21 MED FILL — CYANOCOBALAMIN (VIT B-12) 1,000 MCG/ML INJECTION SOLUTION: SUBCUTANEOUS | 28 days supply | Qty: 2 | Fill #6

## 2022-09-21 MED FILL — ESOMEPRAZOLE MAGNESIUM 40 MG CAPSULE,DELAYED RELEASE: ORAL | 30 days supply | Qty: 60 | Fill #5

## 2022-09-29 ENCOUNTER — Telehealth
Admit: 2022-09-29 | Discharge: 2022-09-30 | Payer: BLUE CROSS/BLUE SHIELD | Attending: Psychologist | Primary: Psychologist

## 2022-09-29 DIAGNOSIS — F411 Generalized anxiety disorder: Principal | ICD-10-CM

## 2022-09-29 DIAGNOSIS — Z87828 Personal history of other (healed) physical injury and trauma: Principal | ICD-10-CM

## 2022-09-29 DIAGNOSIS — G894 Chronic pain syndrome: Principal | ICD-10-CM

## 2022-09-29 DIAGNOSIS — M7918 Myalgia, other site: Principal | ICD-10-CM

## 2022-09-29 DIAGNOSIS — F331 Major depressive disorder, recurrent, moderate: Principal | ICD-10-CM

## 2022-09-29 DIAGNOSIS — G8929 Other chronic pain: Principal | ICD-10-CM

## 2022-09-30 MED ORDER — NICOTINE (POLACRILEX) 2 MG BUCCAL LOZENGE
BUCCAL | 3 refills | 9 days | PRN
Start: 2022-09-30 — End: 2022-12-29

## 2022-09-30 MED ORDER — FAMOTIDINE 40 MG TABLET
ORAL_TABLET | Freq: Two times a day (BID) | ORAL | 1 refills | 90 days
Start: 2022-09-30 — End: 2023-09-30

## 2022-09-30 MED ORDER — CYCLOSPORINE 0.05 % EYE DROPS IN A DROPPERETTE
Freq: Two times a day (BID) | OPHTHALMIC | 3 refills | 90 days | Status: CP
Start: 2022-09-30 — End: 2023-09-25
  Filled 2022-10-07: qty 180, 90d supply, fill #0

## 2022-09-30 MED ORDER — CHOLECALCIFEROL (VITAMIN D3) 25 MCG (1,000 UNIT) TABLET
ORAL_TABLET | Freq: Every day | ORAL | 11 refills | 30 days
Start: 2022-09-30 — End: 2023-09-30

## 2022-10-01 MED ORDER — NICOTINE (POLACRILEX) 2 MG BUCCAL LOZENGE
BUCCAL | 3 refills | 9 days | Status: CP | PRN
Start: 2022-10-01 — End: 2022-12-30
  Filled 2022-10-07: qty 144, 12d supply, fill #0

## 2022-10-05 ENCOUNTER — Ambulatory Visit: Admit: 2022-10-05 | Discharge: 2022-10-06 | Payer: BLUE CROSS/BLUE SHIELD | Attending: School | Primary: School

## 2022-10-15 DIAGNOSIS — K50818 Crohn's disease of both small and large intestine with other complication: Principal | ICD-10-CM

## 2022-10-15 MED ORDER — CHOLECALCIFEROL (VITAMIN D3) 25 MCG (1,000 UNIT) TABLET
ORAL_TABLET | Freq: Every day | ORAL | 11 refills | 30 days
Start: 2022-10-15 — End: 2023-10-15

## 2022-10-15 MED ORDER — FAMOTIDINE 40 MG TABLET
ORAL_TABLET | Freq: Two times a day (BID) | ORAL | 1 refills | 90 days
Start: 2022-10-15 — End: 2023-10-15

## 2022-10-15 MED ORDER — ESOMEPRAZOLE MAGNESIUM 40 MG CAPSULE,DELAYED RELEASE
ORAL_CAPSULE | Freq: Two times a day (BID) | ORAL | 5 refills | 30 days
Start: 2022-10-15 — End: 2023-10-15

## 2022-10-15 MED ORDER — AZATHIOPRINE 50 MG TABLET
ORAL_TABLET | Freq: Every day | ORAL | 1 refills | 90 days
Start: 2022-10-15 — End: 2023-10-15

## 2022-10-17 MED ORDER — CHOLECALCIFEROL (VITAMIN D3) 25 MCG (1,000 UNIT) TABLET
ORAL_TABLET | Freq: Every day | ORAL | 11 refills | 30 days
Start: 2022-10-17 — End: 2023-10-17

## 2022-10-18 MED ORDER — AZATHIOPRINE 50 MG TABLET
ORAL_TABLET | Freq: Every day | ORAL | 0 refills | 90 days | Status: CP
Start: 2022-10-18 — End: 2023-10-18
  Filled 2022-10-21: qty 180, 90d supply, fill #0

## 2022-10-18 MED ORDER — FAMOTIDINE 40 MG TABLET
ORAL_TABLET | Freq: Two times a day (BID) | ORAL | 0 refills | 90 days | Status: CP
Start: 2022-10-18 — End: 2023-10-18
  Filled 2022-10-21: qty 90, 90d supply, fill #0

## 2022-10-18 MED ORDER — ESOMEPRAZOLE MAGNESIUM 40 MG CAPSULE,DELAYED RELEASE
ORAL_CAPSULE | Freq: Two times a day (BID) | ORAL | 5 refills | 30 days | Status: CP
Start: 2022-10-18 — End: 2023-10-18
  Filled 2022-10-21: qty 60, 30d supply, fill #0

## 2022-10-20 MED ORDER — DEXTROAMPHETAMINE-AMPHETAMINE ER 20 MG 24HR CAPSULE,EXTEND RELEASE
ORAL_CAPSULE | Freq: Every morning | ORAL | 0 refills | 29 days | Status: CP
Start: 2022-10-20 — End: 2022-11-18

## 2022-10-21 MED FILL — CYANOCOBALAMIN (VIT B-12) 1,000 MCG/ML INJECTION SOLUTION: SUBCUTANEOUS | 28 days supply | Qty: 2 | Fill #7

## 2022-10-21 MED FILL — AMMONIUM LACTATE 12 % TOPICAL CREAM: 30 days supply | Qty: 385 | Fill #4

## 2022-10-27 ENCOUNTER — Telehealth: Admit: 2022-10-27 | Discharge: 2022-10-28 | Payer: BLUE CROSS/BLUE SHIELD | Attending: School | Primary: School

## 2022-10-28 MED FILL — MAGNESIUM OXIDE 400 MG (241.3 MG MAGNESIUM) TABLET: ORAL | 30 days supply | Qty: 60 | Fill #7

## 2022-11-09 ENCOUNTER — Ambulatory Visit: Admit: 2022-11-09 | Payer: BLUE CROSS/BLUE SHIELD | Attending: School | Primary: School

## 2022-11-10 DIAGNOSIS — K50818 Crohn's disease of both small and large intestine with other complication: Principal | ICD-10-CM

## 2022-11-10 MED ORDER — CHOLECALCIFEROL (VITAMIN D3) 25 MCG (1,000 UNIT) TABLET
ORAL_TABLET | Freq: Every day | ORAL | 11 refills | 30 days | Status: CP
Start: 2022-11-10 — End: 2023-11-10
  Filled 2022-11-16: qty 30, 30d supply, fill #0

## 2022-11-10 MED ORDER — NICOTINE 14 MG/24 HR DAILY TRANSDERMAL PATCH
MEDICATED_PATCH | TRANSDERMAL | 2 refills | 28 days
Start: 2022-11-10 — End: ?

## 2022-11-10 MED ORDER — AZATHIOPRINE 50 MG TABLET
ORAL_TABLET | Freq: Every day | ORAL | 0 refills | 90 days | Status: CP
Start: 2022-11-10 — End: 2023-11-10
  Filled 2023-01-19: qty 180, 90d supply, fill #0

## 2022-11-10 MED ORDER — FAMOTIDINE 40 MG TABLET
ORAL_TABLET | Freq: Two times a day (BID) | ORAL | 0 refills | 90 days
Start: 2022-11-10 — End: 2023-11-10

## 2022-11-11 MED ORDER — FAMOTIDINE 40 MG TABLET
ORAL_TABLET | Freq: Two times a day (BID) | ORAL | 0 refills | 90 days | Status: CP
Start: 2022-11-11 — End: 2023-11-11
  Filled 2023-01-19: qty 90, 90d supply, fill #0

## 2022-11-11 MED ORDER — NICOTINE 14 MG/24 HR DAILY TRANSDERMAL PATCH
MEDICATED_PATCH | TRANSDERMAL | 2 refills | 28 days | Status: CP
Start: 2022-11-11 — End: ?
  Filled 2022-11-16: qty 28, 28d supply, fill #0

## 2022-11-16 MED ORDER — POTASSIUM CHLORIDE ER 20 MEQ TABLET,EXTENDED RELEASE(PART/CRYST)
ORAL_TABLET | Freq: Every day | ORAL | 2 refills | 30 days
Start: 2022-11-16 — End: ?

## 2022-11-16 MED FILL — NICOTINE (POLACRILEX) 2 MG BUCCAL LOZENGE: BUCCAL | 12 days supply | Qty: 144 | Fill #1

## 2022-11-16 MED FILL — CYANOCOBALAMIN (VIT B-12) 1,000 MCG/ML INJECTION SOLUTION: SUBCUTANEOUS | 28 days supply | Qty: 2 | Fill #8

## 2022-11-16 MED FILL — ESOMEPRAZOLE MAGNESIUM 40 MG CAPSULE,DELAYED RELEASE: ORAL | 30 days supply | Qty: 60 | Fill #1

## 2022-11-16 MED FILL — BD LUER-LOK SYRINGE 3 ML 25 X 5/8": 28 days supply | Qty: 2 | Fill #4

## 2022-11-17 DIAGNOSIS — R1310 Dysphagia, unspecified: Principal | ICD-10-CM

## 2022-11-18 ENCOUNTER — Ambulatory Visit: Admit: 2022-11-18 | Discharge: 2022-11-19 | Payer: BLUE CROSS/BLUE SHIELD

## 2022-11-18 MED FILL — AMMONIUM LACTATE 12 % TOPICAL CREAM: 30 days supply | Qty: 385 | Fill #5

## 2022-11-19 MED ORDER — DEXTROAMPHETAMINE-AMPHETAMINE ER 20 MG 24HR CAPSULE,EXTEND RELEASE
ORAL_CAPSULE | Freq: Every morning | ORAL | 0 refills | 29 days | Status: CP
Start: 2022-11-19 — End: 2022-12-18

## 2022-11-29 ENCOUNTER — Telehealth: Admit: 2022-11-29 | Discharge: 2022-11-30 | Payer: BLUE CROSS/BLUE SHIELD | Attending: School | Primary: School

## 2022-12-02 MED ORDER — ALPRAZOLAM 1 MG TABLET
ORAL_TABLET | Freq: Every day | ORAL | 0 refills | 30 days | Status: CP | PRN
Start: 2022-12-02 — End: 2023-01-01

## 2022-12-15 MED FILL — ESOMEPRAZOLE MAGNESIUM 40 MG CAPSULE,DELAYED RELEASE: ORAL | 30 days supply | Qty: 60 | Fill #2

## 2022-12-20 ENCOUNTER — Ambulatory Visit: Admit: 2022-12-20 | Discharge: 2022-12-21 | Payer: BLUE CROSS/BLUE SHIELD

## 2022-12-20 MED ORDER — PROPRANOLOL 10 MG TABLET
ORAL_TABLET | ORAL | 0 refills | 90 days | Status: CP
Start: 2022-12-20 — End: 2023-03-20

## 2022-12-20 MED ORDER — DEXTROAMPHETAMINE-AMPHETAMINE ER 20 MG 24HR CAPSULE,EXTEND RELEASE
ORAL_CAPSULE | Freq: Every morning | ORAL | 0 refills | 30 days | Status: CP
Start: 2022-12-20 — End: 2023-01-18

## 2022-12-20 MED ORDER — ALPRAZOLAM 1 MG TABLET
ORAL_TABLET | Freq: Every day | ORAL | 0 refills | 20 days | Status: CP | PRN
Start: 2022-12-20 — End: ?

## 2022-12-20 MED FILL — CHOLECALCIFEROL (VITAMIN D3) 25 MCG (1,000 UNIT) TABLET: ORAL | 30 days supply | Qty: 30 | Fill #1

## 2022-12-20 MED FILL — MAGNESIUM OXIDE 400 MG (241.3 MG MAGNESIUM) TABLET: ORAL | 30 days supply | Qty: 60 | Fill #8

## 2022-12-23 ENCOUNTER — Telehealth: Admit: 2022-12-23 | Discharge: 2022-12-24 | Payer: BLUE CROSS/BLUE SHIELD | Attending: School | Primary: School

## 2022-12-23 DIAGNOSIS — F909 Attention-deficit hyperactivity disorder, unspecified type: Principal | ICD-10-CM

## 2022-12-26 MED ORDER — DEXTROAMPHETAMINE-AMPHETAMINE ER 20 MG 24HR CAPSULE,EXTEND RELEASE
ORAL_CAPSULE | Freq: Two times a day (BID) | ORAL | 0 refills | 29 days | Status: CP
Start: 2022-12-26 — End: 2023-01-24

## 2022-12-27 MED ORDER — DEXTROAMPHETAMINE-AMPHETAMINE ER 20 MG 24HR CAPSULE,EXTEND RELEASE
ORAL_CAPSULE | Freq: Two times a day (BID) | ORAL | 0 refills | 29 days | Status: CP
Start: 2022-12-27 — End: 2023-01-25

## 2023-01-10 ENCOUNTER — Ambulatory Visit: Admit: 2023-01-10 | Discharge: 2023-01-11 | Payer: BLUE CROSS/BLUE SHIELD

## 2023-01-16 MED ORDER — ONDANSETRON 8 MG DISINTEGRATING TABLET
ORAL_TABLET | Freq: Three times a day (TID) | ORAL | 0 refills | 20 days | PRN
Start: 2023-01-16 — End: ?

## 2023-01-19 MED ORDER — ONDANSETRON 8 MG DISINTEGRATING TABLET
ORAL_TABLET | Freq: Three times a day (TID) | ORAL | 0 refills | 20 days | Status: CP | PRN
Start: 2023-01-19 — End: ?
  Filled 2023-01-19: qty 60, 20d supply, fill #0

## 2023-01-19 MED ORDER — DEXTROAMPHETAMINE-AMPHETAMINE ER 20 MG 24HR CAPSULE,EXTEND RELEASE
ORAL_CAPSULE | Freq: Every morning | ORAL | 0 refills | 30 days | Status: CP
Start: 2023-01-19 — End: 2023-02-17

## 2023-01-19 MED FILL — ESOMEPRAZOLE MAGNESIUM 40 MG CAPSULE,DELAYED RELEASE: ORAL | 30 days supply | Qty: 60 | Fill #3

## 2023-01-19 MED FILL — RESTASIS 0.05 % EYE DROPS IN A DROPPERETTE: OPHTHALMIC | 90 days supply | Qty: 180 | Fill #1

## 2023-01-25 MED ORDER — DEXTROAMPHETAMINE-AMPHETAMINE ER 20 MG 24HR CAPSULE,EXTEND RELEASE
ORAL_CAPSULE | Freq: Two times a day (BID) | ORAL | 0 refills | 29 days | Status: CP
Start: 2023-01-25 — End: 2023-02-23

## 2023-01-26 DIAGNOSIS — E611 Iron deficiency: Principal | ICD-10-CM

## 2023-01-26 MED ORDER — DEXTROAMPHETAMINE-AMPHETAMINE ER 20 MG 24HR CAPSULE,EXTEND RELEASE
ORAL_CAPSULE | Freq: Two times a day (BID) | ORAL | 0 refills | 29 days | Status: CP
Start: 2023-01-26 — End: 2023-02-24

## 2023-01-26 MED ORDER — FERROUS SULFATE 325 MG (65 MG IRON) TABLET,DELAYED RELEASE
ORAL_TABLET | Freq: Every day | ORAL | 11 refills | 30 days | Status: CP
Start: 2023-01-26 — End: 2024-01-26

## 2023-01-31 MED ORDER — ALPRAZOLAM 1 MG TABLET
ORAL_TABLET | Freq: Every day | ORAL | 0 refills | 20 days | Status: CP | PRN
Start: 2023-01-31 — End: ?

## 2023-02-07 ENCOUNTER — Ambulatory Visit: Admit: 2023-02-07 | Discharge: 2023-02-07 | Payer: BLUE CROSS/BLUE SHIELD

## 2023-02-07 DIAGNOSIS — H524 Presbyopia: Principal | ICD-10-CM

## 2023-02-07 DIAGNOSIS — H5213 Myopia, bilateral: Principal | ICD-10-CM

## 2023-02-07 DIAGNOSIS — H52203 Unspecified astigmatism, bilateral: Principal | ICD-10-CM

## 2023-02-07 DIAGNOSIS — K50018 Crohn's disease of small intestine with other complication: Principal | ICD-10-CM

## 2023-02-07 MED ORDER — PROTEIN ORAL POWDER
Freq: Every day | ORAL | 0 refills | 0 days | Status: CP | PRN
Start: 2023-02-07 — End: ?

## 2023-02-08 DIAGNOSIS — E611 Iron deficiency: Principal | ICD-10-CM

## 2023-02-08 MED ORDER — POTASSIUM CHLORIDE ER 20 MEQ TABLET,EXTENDED RELEASE(PART/CRYST)
ORAL_TABLET | Freq: Every day | ORAL | 2 refills | 30 days
Start: 2023-02-08 — End: ?

## 2023-02-08 MED ORDER — LEVETIRACETAM 250 MG TABLET
ORAL_TABLET | Freq: Two times a day (BID) | ORAL | 0 refills | 45 days
Start: 2023-02-08 — End: ?

## 2023-02-08 MED ORDER — MAGNESIUM OXIDE 400 MG (241.3 MG MAGNESIUM) TABLET
ORAL_TABLET | Freq: Two times a day (BID) | ORAL | 11 refills | 30.00000 days
Start: 2023-02-08 — End: ?

## 2023-02-08 MED ORDER — FERROUS SULFATE 325 MG (65 MG IRON) TABLET,DELAYED RELEASE
ORAL_TABLET | Freq: Every day | ORAL | 11 refills | 30 days
Start: 2023-02-08 — End: 2024-02-08

## 2023-02-10 MED ORDER — LEVETIRACETAM 250 MG TABLET
ORAL_TABLET | Freq: Two times a day (BID) | ORAL | 0 refills | 45 days
Start: 2023-02-10 — End: ?

## 2023-02-12 MED ORDER — POTASSIUM CHLORIDE ER 20 MEQ TABLET,EXTENDED RELEASE(PART/CRYST)
ORAL_TABLET | Freq: Every day | ORAL | 2 refills | 30 days | Status: CP
Start: 2023-02-12 — End: ?
  Filled 2023-02-17: qty 30, 30d supply, fill #0

## 2023-02-12 MED ORDER — FERROUS SULFATE 325 MG (65 MG IRON) TABLET,DELAYED RELEASE
ORAL_TABLET | Freq: Every day | ORAL | 11 refills | 30 days | Status: CP
Start: 2023-02-12 — End: 2024-02-12
  Filled 2023-04-29: qty 100, 100d supply, fill #0

## 2023-02-13 ENCOUNTER — Ambulatory Visit: Admit: 2023-02-13 | Discharge: 2023-02-14

## 2023-02-13 DIAGNOSIS — E559 Vitamin D deficiency, unspecified: Principal | ICD-10-CM

## 2023-02-13 MED ORDER — CHOLECALCIFEROL (VITAMIN D3) 25 MCG (1,000 UNIT) TABLET
ORAL_TABLET | Freq: Every day | ORAL | 11 refills | 30 days | Status: CP
Start: 2023-02-13 — End: 2024-02-13

## 2023-02-16 MED FILL — ESOMEPRAZOLE MAGNESIUM 40 MG CAPSULE,DELAYED RELEASE: ORAL | 30 days supply | Qty: 60 | Fill #4

## 2023-02-18 MED ORDER — DEXTROAMPHETAMINE-AMPHETAMINE ER 20 MG 24HR CAPSULE,EXTEND RELEASE
ORAL_CAPSULE | Freq: Every morning | ORAL | 0 refills | 30 days | Status: CP
Start: 2023-02-18 — End: 2023-03-19

## 2023-02-23 DIAGNOSIS — K50818 Crohn's disease of both small and large intestine with other complication: Principal | ICD-10-CM

## 2023-02-23 DIAGNOSIS — K50018 Crohn's disease of small intestine with other complication: Principal | ICD-10-CM

## 2023-02-23 MED ORDER — ALPRAZOLAM 1 MG TABLET
ORAL_TABLET | Freq: Every day | ORAL | 0 refills | 20 days | Status: CP | PRN
Start: 2023-02-23 — End: ?

## 2023-02-23 MED ORDER — AZATHIOPRINE 50 MG TABLET
ORAL_TABLET | Freq: Every day | ORAL | 0 refills | 90 days
Start: 2023-02-23 — End: 2024-02-23

## 2023-02-23 MED ORDER — ONDANSETRON 8 MG DISINTEGRATING TABLET
ORAL_TABLET | Freq: Three times a day (TID) | ORAL | 0 refills | 20 days | PRN
Start: 2023-02-23 — End: ?

## 2023-02-23 MED ORDER — ERGOCALCIFEROL (VITAMIN D2) 1,250 MCG (50,000 UNIT) CAPSULE
ORAL_CAPSULE | ORAL | 0 refills | 56 days
Start: 2023-02-23 — End: 2024-02-23

## 2023-02-23 MED ORDER — FAMOTIDINE 40 MG TABLET
ORAL_TABLET | Freq: Two times a day (BID) | ORAL | 0 refills | 90 days
Start: 2023-02-23 — End: 2024-02-23

## 2023-02-23 MED ORDER — DEXTROAMPHETAMINE-AMPHETAMINE ER 20 MG 24HR CAPSULE,EXTEND RELEASE
ORAL_CAPSULE | Freq: Two times a day (BID) | ORAL | 0 refills | 29 days | Status: CP
Start: 2023-02-23 — End: 2023-03-24

## 2023-02-23 MED ORDER — PROTEIN ORAL POWDER
Freq: Every day | ORAL | 0 refills | 0 days | Status: CP | PRN
Start: 2023-02-23 — End: ?

## 2023-02-24 ENCOUNTER — Telehealth: Admit: 2023-02-24 | Discharge: 2023-02-25 | Payer: BLUE CROSS/BLUE SHIELD | Attending: School | Primary: School

## 2023-02-24 DIAGNOSIS — F909 Attention-deficit hyperactivity disorder, unspecified type: Principal | ICD-10-CM

## 2023-02-24 MED ORDER — FAMOTIDINE 40 MG TABLET
ORAL_TABLET | Freq: Two times a day (BID) | ORAL | 0 refills | 90 days | Status: CP
Start: 2023-02-24 — End: 2024-02-24

## 2023-02-24 MED ORDER — AZATHIOPRINE 50 MG TABLET
ORAL_TABLET | Freq: Every day | ORAL | 0 refills | 90 days | Status: CP
Start: 2023-02-24 — End: 2024-02-24
  Filled 2023-04-25: qty 180, 90d supply, fill #0

## 2023-02-24 MED ORDER — ONDANSETRON 8 MG DISINTEGRATING TABLET
ORAL_TABLET | Freq: Three times a day (TID) | ORAL | 0 refills | 20 days | Status: CP | PRN
Start: 2023-02-24 — End: ?
  Filled 2023-03-03: qty 60, 20d supply, fill #0

## 2023-02-24 MED ORDER — DEXTROAMPHETAMINE-AMPHETAMINE ER 20 MG 24HR CAPSULE,EXTEND RELEASE
ORAL_CAPSULE | Freq: Two times a day (BID) | ORAL | 0 refills | 29 days | Status: CP
Start: 2023-02-24 — End: 2023-03-25

## 2023-02-25 MED ORDER — DEXTROAMPHETAMINE-AMPHETAMINE ER 20 MG 24HR CAPSULE,EXTEND RELEASE
ORAL_CAPSULE | Freq: Two times a day (BID) | ORAL | 0 refills | 29 days | Status: CP
Start: 2023-02-25 — End: 2023-03-26

## 2023-03-03 ENCOUNTER — Ambulatory Visit: Admit: 2023-03-03 | Discharge: 2023-03-04 | Payer: BLUE CROSS/BLUE SHIELD

## 2023-03-03 MED FILL — CYANOCOBALAMIN (VIT B-12) 1,000 MCG/ML INJECTION SOLUTION: SUBCUTANEOUS | 28 days supply | Qty: 2 | Fill #9

## 2023-03-07 DIAGNOSIS — K50018 Crohn's disease of small intestine with other complication: Principal | ICD-10-CM

## 2023-03-07 MED ORDER — MAGNESIUM OXIDE 400 MG (241.3 MG MAGNESIUM) TABLET
ORAL_TABLET | Freq: Two times a day (BID) | ORAL | 11 refills | 30 days | Status: CP
Start: 2023-03-07 — End: ?
  Filled 2023-03-09: qty 60, 30d supply, fill #0

## 2023-03-07 MED ORDER — PROTEIN ORAL POWDER
Freq: Every day | ORAL | 0 refills | 0 days | Status: CP | PRN
Start: 2023-03-07 — End: ?

## 2023-03-17 MED ORDER — PROPRANOLOL 10 MG TABLET
ORAL_TABLET | ORAL | 0 refills | 90 days
Start: 2023-03-17 — End: 2023-06-14

## 2023-03-20 DIAGNOSIS — E559 Vitamin D deficiency, unspecified: Principal | ICD-10-CM

## 2023-03-20 MED ORDER — CYANOCOBALAMIN (VIT B-12) 1,000 MCG/ML INJECTION SOLUTION
SUBCUTANEOUS | 3 refills | 84 days
Start: 2023-03-20 — End: 2024-03-19

## 2023-03-20 MED ORDER — CHOLECALCIFEROL (VITAMIN D3) 25 MCG (1,000 UNIT) TABLET
ORAL_TABLET | Freq: Every day | ORAL | 11 refills | 30 days
Start: 2023-03-20 — End: 2024-03-19

## 2023-03-20 MED ORDER — PROPRANOLOL 10 MG TABLET
ORAL_TABLET | ORAL | 0 refills | 90 days | Status: CP
Start: 2023-03-20 — End: 2023-06-17

## 2023-03-21 ENCOUNTER — Telehealth: Admit: 2023-03-21 | Discharge: 2023-03-22 | Payer: BLUE CROSS/BLUE SHIELD

## 2023-03-21 ENCOUNTER — Ambulatory Visit: Admit: 2023-03-21 | Discharge: 2023-03-22 | Payer: BLUE CROSS/BLUE SHIELD

## 2023-03-21 MED ORDER — CYANOCOBALAMIN (VIT B-12) 1,000 MCG/ML INJECTION SOLUTION
SUBCUTANEOUS | 3 refills | 84 days
Start: 2023-03-21 — End: 2024-03-20

## 2023-03-21 MED ORDER — DEXTROAMPHETAMINE-AMPHETAMINE ER 20 MG 24HR CAPSULE,EXTEND RELEASE
ORAL_CAPSULE | Freq: Two times a day (BID) | ORAL | 0 refills | 29.00000 days | Status: CP
Start: 2023-03-21 — End: 2023-03-21

## 2023-03-21 MED ORDER — DIAZEPAM 5 MG TABLET
ORAL_TABLET | ORAL | 3 refills | 30 days | Status: CP
Start: 2023-03-21 — End: 2023-07-19

## 2023-03-24 ENCOUNTER — Telehealth: Admit: 2023-03-24 | Payer: BLUE CROSS/BLUE SHIELD | Attending: School | Primary: School

## 2023-04-05 ENCOUNTER — Telehealth: Admit: 2023-04-05 | Discharge: 2023-04-06 | Payer: BLUE CROSS/BLUE SHIELD | Attending: School | Primary: School

## 2023-04-05 DIAGNOSIS — F909 Attention-deficit hyperactivity disorder, unspecified type: Principal | ICD-10-CM

## 2023-04-06 MED ORDER — CYANOCOBALAMIN (VIT B-12) 1,000 MCG/ML INJECTION SOLUTION
SUBCUTANEOUS | 3 refills | 84 days
Start: 2023-04-06 — End: 2024-04-05

## 2023-04-06 MED ORDER — ERGOCALCIFEROL (VITAMIN D2) 1,250 MCG (50,000 UNIT) CAPSULE
ORAL_CAPSULE | ORAL | 1 refills | 0 days
Start: 2023-04-06 — End: ?

## 2023-04-06 MED ORDER — ONDANSETRON 8 MG DISINTEGRATING TABLET
ORAL_TABLET | Freq: Three times a day (TID) | ORAL | 0 refills | 20 days | PRN
Start: 2023-04-06 — End: ?

## 2023-04-07 MED ORDER — ONDANSETRON 8 MG DISINTEGRATING TABLET
ORAL_TABLET | Freq: Three times a day (TID) | ORAL | 0 refills | 20 days | Status: CP | PRN
Start: 2023-04-07 — End: ?
  Filled 2023-04-07: qty 60, 20d supply, fill #0

## 2023-04-07 MED ORDER — CYANOCOBALAMIN (VIT B-12) 1,000 MCG/ML INJECTION SOLUTION
SUBCUTANEOUS | 3 refills | 84 days
Start: 2023-04-07 — End: 2024-04-06

## 2023-04-07 MED FILL — KLOR-CON M20 MEQ TABLET,EXTENDED RELEASE: ORAL | 30 days supply | Qty: 30 | Fill #1

## 2023-04-07 MED FILL — ESOMEPRAZOLE MAGNESIUM 40 MG CAPSULE,DELAYED RELEASE: ORAL | 30 days supply | Qty: 60 | Fill #5

## 2023-04-07 MED FILL — MAGNESIUM OXIDE 400 MG (241.3 MG MAGNESIUM) TABLET: ORAL | 30 days supply | Qty: 60 | Fill #1

## 2023-04-12 MED ORDER — ERGOCALCIFEROL (VITAMIN D2) 1,250 MCG (50,000 UNIT) CAPSULE
ORAL_CAPSULE | ORAL | 1 refills | 84 days
Start: 2023-04-12 — End: ?

## 2023-04-13 MED ORDER — PEG 3350-ELECTROLYTES 236 GRAM-22.74 GRAM-6.74 GRAM-5.86 GRAM SOLUTION
0 refills | 0 days | Status: CP
Start: 2023-04-13 — End: ?

## 2023-04-17 ENCOUNTER — Ambulatory Visit: Admit: 2023-04-17 | Discharge: 2023-04-17 | Payer: BLUE CROSS/BLUE SHIELD

## 2023-04-18 MED ORDER — DEXTROAMPHETAMINE-AMPHETAMINE 20 MG TABLET
ORAL_TABLET | Freq: Two times a day (BID) | ORAL | 0 refills | 30 days | Status: CP
Start: 2023-04-18 — End: 2023-05-18

## 2023-04-20 MED ORDER — DEXTROAMPHETAMINE-AMPHETAMINE ER 20 MG 24HR CAPSULE,EXTEND RELEASE
ORAL_CAPSULE | Freq: Two times a day (BID) | ORAL | 0 refills | 29.00000 days | Status: CP
Start: 2023-04-20 — End: 2023-03-21

## 2023-04-23 MED ORDER — DIAZEPAM 5 MG TABLET
ORAL_TABLET | ORAL | 3 refills | 30 days
Start: 2023-04-23 — End: 2023-08-21

## 2023-04-24 MED ORDER — DIAZEPAM 5 MG TABLET
ORAL_TABLET | ORAL | 0 refills | 30 days | Status: CP
Start: 2023-04-24 — End: 2023-05-24

## 2023-04-26 DIAGNOSIS — K50818 Crohn's disease of both small and large intestine with other complication: Principal | ICD-10-CM

## 2023-04-26 DIAGNOSIS — R1310 Dysphagia, unspecified: Principal | ICD-10-CM

## 2023-04-26 MED ORDER — ESOMEPRAZOLE MAGNESIUM 40 MG CAPSULE,DELAYED RELEASE
ORAL_CAPSULE | Freq: Two times a day (BID) | ORAL | 5 refills | 30 days | Status: CP
Start: 2023-04-26 — End: 2024-04-25

## 2023-04-26 MED ORDER — FAMOTIDINE 40 MG TABLET
ORAL_TABLET | Freq: Every evening | ORAL | 3 refills | 90 days | Status: CP
Start: 2023-04-26 — End: 2024-04-25
  Filled 2023-04-25: qty 90, 90d supply, fill #0

## 2023-04-28 DIAGNOSIS — R1310 Dysphagia, unspecified: Principal | ICD-10-CM

## 2023-04-28 MED ORDER — FAMOTIDINE 40 MG TABLET
ORAL_TABLET | Freq: Two times a day (BID) | ORAL | 3 refills | 45 days | Status: CP
Start: 2023-04-28 — End: 2024-04-27

## 2023-05-01 DIAGNOSIS — K50018 Crohn's disease of small intestine with other complication: Principal | ICD-10-CM

## 2023-05-02 ENCOUNTER — Telehealth: Admit: 2023-05-02 | Discharge: 2023-05-03 | Payer: BLUE CROSS/BLUE SHIELD | Attending: School | Primary: School

## 2023-05-02 MED ORDER — RIFAXIMIN 550 MG TABLET
ORAL_TABLET | Freq: Three times a day (TID) | ORAL | 0 refills | 14.00000 days | Status: CP
Start: 2023-05-02 — End: 2023-05-16

## 2023-05-13 MED ORDER — DEXTROAMPHETAMINE-AMPHETAMINE 20 MG TABLET
ORAL_TABLET | Freq: Two times a day (BID) | ORAL | 0 refills | 30 days
Start: 2023-05-13 — End: 2023-06-12

## 2023-05-16 DIAGNOSIS — K50018 Crohn's disease of small intestine with other complication: Principal | ICD-10-CM

## 2023-05-16 DIAGNOSIS — R5383 Other fatigue: Principal | ICD-10-CM

## 2023-05-17 ENCOUNTER — Telehealth: Admit: 2023-05-17 | Discharge: 2023-05-18 | Payer: BLUE CROSS/BLUE SHIELD | Attending: School | Primary: School

## 2023-05-17 MED ORDER — DEXTROAMPHETAMINE-AMPHETAMINE 20 MG TABLET
ORAL_TABLET | Freq: Two times a day (BID) | ORAL | 0 refills | 30.00000 days | Status: CP
Start: 2023-05-17 — End: 2023-06-16

## 2023-05-20 MED ORDER — DEXTROAMPHETAMINE-AMPHETAMINE ER 20 MG 24HR CAPSULE,EXTEND RELEASE
ORAL_CAPSULE | Freq: Two times a day (BID) | ORAL | 0 refills | 30.00000 days | Status: CP
Start: 2023-05-20 — End: 2023-06-19

## 2023-05-24 ENCOUNTER — Ambulatory Visit
Admit: 2023-05-24 | Discharge: 2023-05-25 | Payer: BLUE CROSS/BLUE SHIELD | Attending: Hematology & Oncology | Primary: Hematology & Oncology

## 2023-05-24 DIAGNOSIS — M5442 Lumbago with sciatica, left side: Principal | ICD-10-CM

## 2023-05-24 DIAGNOSIS — G8929 Other chronic pain: Principal | ICD-10-CM

## 2023-05-24 DIAGNOSIS — M5441 Lumbago with sciatica, right side: Principal | ICD-10-CM

## 2023-05-29 MED ORDER — DIAZEPAM 5 MG TABLET
ORAL_TABLET | ORAL | 0 refills | 30 days
Start: 2023-05-29 — End: 2023-06-28

## 2023-05-30 MED ORDER — DIAZEPAM 5 MG TABLET
ORAL_TABLET | ORAL | 0 refills | 30 days | Status: CP
Start: 2023-05-30 — End: 2023-06-29

## 2023-06-01 ENCOUNTER — Ambulatory Visit
Admit: 2023-06-01 | Discharge: 2023-06-02 | Payer: BLUE CROSS/BLUE SHIELD | Attending: Hematology & Oncology | Primary: Hematology & Oncology

## 2023-06-01 DIAGNOSIS — Z1231 Encounter for screening mammogram for malignant neoplasm of breast: Principal | ICD-10-CM

## 2023-06-01 DIAGNOSIS — G8929 Other chronic pain: Principal | ICD-10-CM

## 2023-06-01 DIAGNOSIS — M5441 Lumbago with sciatica, right side: Principal | ICD-10-CM

## 2023-06-01 DIAGNOSIS — M722 Plantar fascial fibromatosis: Principal | ICD-10-CM

## 2023-06-01 DIAGNOSIS — M797 Fibromyalgia: Principal | ICD-10-CM

## 2023-06-01 DIAGNOSIS — M5442 Lumbago with sciatica, left side: Principal | ICD-10-CM

## 2023-06-01 MED ORDER — DULOXETINE 30 MG CAPSULE,DELAYED RELEASE
ORAL_CAPSULE | Freq: Every day | ORAL | 1 refills | 90 days | Status: CP
Start: 2023-06-01 — End: 2023-06-01

## 2023-06-02 MED FILL — MAGNESIUM OXIDE 400 MG (241.3 MG MAGNESIUM) TABLET: ORAL | 60 days supply | Qty: 120 | Fill #2

## 2023-06-02 MED FILL — RESTASIS 0.05 % EYE DROPS IN A DROPPERETTE: OPHTHALMIC | 90 days supply | Qty: 180 | Fill #2

## 2023-06-05 ENCOUNTER — Other Ambulatory Visit: Payer: Self-pay | Admitting: Internal Medicine

## 2023-06-05 DIAGNOSIS — Z1231 Encounter for screening mammogram for malignant neoplasm of breast: Secondary | ICD-10-CM

## 2023-06-06 ENCOUNTER — Ambulatory Visit: Admit: 2023-06-06 | Discharge: 2023-06-07 | Payer: BLUE CROSS/BLUE SHIELD

## 2023-06-06 DIAGNOSIS — H5213 Myopia, bilateral: Principal | ICD-10-CM

## 2023-06-06 DIAGNOSIS — H524 Presbyopia: Principal | ICD-10-CM

## 2023-06-06 DIAGNOSIS — H52203 Unspecified astigmatism, bilateral: Principal | ICD-10-CM

## 2023-06-09 ENCOUNTER — Ambulatory Visit
Admit: 2023-06-09 | Discharge: 2023-06-10 | Payer: BLUE CROSS/BLUE SHIELD | Attending: Student in an Organized Health Care Education/Training Program | Primary: Student in an Organized Health Care Education/Training Program

## 2023-06-09 DIAGNOSIS — L639 Alopecia areata, unspecified: Principal | ICD-10-CM

## 2023-06-09 DIAGNOSIS — L209 Atopic dermatitis, unspecified: Principal | ICD-10-CM

## 2023-06-09 DIAGNOSIS — I7300 Raynaud's syndrome without gangrene: Principal | ICD-10-CM

## 2023-06-09 DIAGNOSIS — R231 Pallor: Principal | ICD-10-CM

## 2023-06-09 DIAGNOSIS — L719 Rosacea, unspecified: Principal | ICD-10-CM

## 2023-06-09 DIAGNOSIS — L578 Other skin changes due to chronic exposure to nonionizing radiation: Principal | ICD-10-CM

## 2023-06-09 DIAGNOSIS — K580 Irritable bowel syndrome with diarrhea: Principal | ICD-10-CM

## 2023-06-09 MED ORDER — TRIAMCINOLONE ACETONIDE 0.1 % TOPICAL OINTMENT
INTRAMUSCULAR | 1 refills | 0.00000 days | Status: CP
Start: 2023-06-09 — End: ?

## 2023-06-09 MED ORDER — TRETINOIN 0.025 % TOPICAL CREAM
5 refills | 0.00000 days | Status: CP
Start: 2023-06-09 — End: ?

## 2023-06-09 MED ORDER — TACROLIMUS 0.03 % TOPICAL OINTMENT
1 refills | 0.00000 days | Status: CP
Start: 2023-06-09 — End: ?

## 2023-06-09 MED ORDER — NIFEDIPINE ER 30 MG TABLET,EXTENDED RELEASE
ORAL_TABLET | Freq: Every day | ORAL | 3 refills | 90.00000 days | Status: CN
Start: 2023-06-09 — End: 2024-06-08

## 2023-06-09 MED ORDER — RIFAXIMIN 550 MG TABLET
ORAL_TABLET | Freq: Three times a day (TID) | ORAL | 0 refills | 14 days | Status: CP
Start: 2023-06-09 — End: 2023-06-23

## 2023-06-09 MED ORDER — AMLODIPINE 5 MG TABLET
ORAL_TABLET | Freq: Every day | ORAL | 0 refills | 90.00000 days | Status: CP
Start: 2023-06-09 — End: 2024-06-08

## 2023-06-11 DIAGNOSIS — E559 Vitamin D deficiency, unspecified: Principal | ICD-10-CM

## 2023-06-11 MED ORDER — CHOLECALCIFEROL (VITAMIN D3) 25 MCG (1,000 UNIT) TABLET
ORAL_TABLET | Freq: Every day | ORAL | 11 refills | 30 days | Status: CP
Start: 2023-06-11 — End: 2024-06-10

## 2023-06-12 DIAGNOSIS — L639 Alopecia areata, unspecified: Principal | ICD-10-CM

## 2023-06-12 MED ORDER — TACROLIMUS 0.03 % TOPICAL OINTMENT
1 refills | 0.00000 days | Status: CP
Start: 2023-06-12 — End: ?

## 2023-06-13 ENCOUNTER — Ambulatory Visit: Admit: 2023-06-13 | Discharge: 2023-06-14 | Payer: BLUE CROSS/BLUE SHIELD

## 2023-06-13 MED ORDER — CLONAZEPAM 1 MG TABLET
ORAL_TABLET | ORAL | 3 refills | 30 days | Status: CP
Start: 2023-06-13 — End: 2023-10-11

## 2023-06-13 MED ORDER — DEXTROAMPHETAMINE-AMPHETAMINE 20 MG TABLET
ORAL_TABLET | Freq: Two times a day (BID) | ORAL | 0 refills | 30 days | Status: CP
Start: 2023-06-13 — End: 2023-07-13

## 2023-06-14 ENCOUNTER — Telehealth: Admit: 2023-06-14 | Discharge: 2023-06-15 | Payer: BLUE CROSS/BLUE SHIELD | Attending: School | Primary: School

## 2023-06-14 DIAGNOSIS — E559 Vitamin D deficiency, unspecified: Principal | ICD-10-CM

## 2023-06-14 MED ORDER — CHOLECALCIFEROL (VITAMIN D3) 25 MCG (1,000 UNIT) TABLET
ORAL_TABLET | Freq: Every day | ORAL | 11 refills | 30 days | Status: CP
Start: 2023-06-14 — End: 2024-06-13

## 2023-07-05 MED ORDER — CLONAZEPAM 0.5 MG TABLET
ORAL_TABLET | ORAL | 0 refills | 30 days | Status: CP
Start: 2023-07-05 — End: 2023-08-04

## 2023-07-10 MED ORDER — DEXTROAMPHETAMINE-AMPHETAMINE 20 MG TABLET
ORAL_TABLET | Freq: Two times a day (BID) | ORAL | 0 refills | 30 days
Start: 2023-07-10 — End: 2023-08-09

## 2023-07-11 MED ORDER — DEXTROAMPHETAMINE-AMPHETAMINE ER 20 MG 24HR CAPSULE,EXTEND RELEASE
ORAL_CAPSULE | Freq: Two times a day (BID) | ORAL | 0 refills | 30 days | Status: CP
Start: 2023-07-11 — End: 2023-08-10

## 2023-07-11 MED ORDER — DEXTROAMPHETAMINE-AMPHETAMINE 20 MG TABLET
ORAL_TABLET | Freq: Two times a day (BID) | ORAL | 0 refills | 30 days
Start: 2023-07-11 — End: 2023-08-10

## 2023-07-18 ENCOUNTER — Institutional Professional Consult (permissible substitution)
Admit: 2023-07-18 | Discharge: 2023-07-19 | Payer: BLUE CROSS/BLUE SHIELD | Attending: Registered" | Primary: Registered"

## 2023-07-18 MED ORDER — AMLODIPINE 10 MG TABLET
ORAL_TABLET | Freq: Every day | ORAL | 0 refills | 90 days | Status: CP
Start: 2023-07-18 — End: 2023-10-16

## 2023-07-26 ENCOUNTER — Ambulatory Visit
Admit: 2023-07-26 | Discharge: 2023-07-27 | Payer: BLUE CROSS/BLUE SHIELD | Attending: Student in an Organized Health Care Education/Training Program | Primary: Student in an Organized Health Care Education/Training Program

## 2023-07-26 ENCOUNTER — Ambulatory Visit: Admit: 2023-07-26 | Discharge: 2023-07-27 | Payer: BLUE CROSS/BLUE SHIELD

## 2023-07-26 DIAGNOSIS — L578 Other skin changes due to chronic exposure to nonionizing radiation: Principal | ICD-10-CM

## 2023-07-26 DIAGNOSIS — L639 Alopecia areata, unspecified: Principal | ICD-10-CM

## 2023-07-26 DIAGNOSIS — I7300 Raynaud's syndrome without gangrene: Principal | ICD-10-CM

## 2023-07-26 MED ORDER — TACROLIMUS 0.03 % TOPICAL OINTMENT
1 refills | 0.00 days | Status: CP
Start: 2023-07-26 — End: ?

## 2023-07-26 MED ORDER — TRETINOIN 0.025 % TOPICAL CREAM
5 refills | 0.00 days | Status: CP
Start: 2023-07-26 — End: ?

## 2023-07-26 MED ORDER — NIFEDIPINE ER 30 MG TABLET,EXTENDED RELEASE
ORAL_TABLET | Freq: Every day | ORAL | 3 refills | 30.00 days | Status: CP
Start: 2023-07-26 — End: 2023-11-23

## 2023-08-03 MED ORDER — DEXTROAMPHETAMINE-AMPHETAMINE ER 20 MG 24HR CAPSULE,EXTEND RELEASE
ORAL_CAPSULE | Freq: Two times a day (BID) | ORAL | 0 refills | 30.00 days
Start: 2023-08-03 — End: 2023-09-02

## 2023-08-04 MED ORDER — DEXTROAMPHETAMINE-AMPHETAMINE ER 20 MG 24HR CAPSULE,EXTEND RELEASE
ORAL_CAPSULE | Freq: Two times a day (BID) | ORAL | 0 refills | 30.00 days
Start: 2023-08-04 — End: 2023-09-03

## 2023-08-05 MED ORDER — CLONAZEPAM 0.5 MG TABLET
ORAL_TABLET | ORAL | 0 refills | 30.00 days
Start: 2023-08-05 — End: 2023-09-04

## 2023-08-07 MED ORDER — DEXTROAMPHETAMINE-AMPHETAMINE ER 20 MG 24HR CAPSULE,EXTEND RELEASE
ORAL_CAPSULE | Freq: Every morning | ORAL | 0 refills | 30.00 days | Status: CP
Start: 2023-08-07 — End: ?

## 2023-08-07 MED ORDER — CLONAZEPAM 0.5 MG TABLET
ORAL_TABLET | ORAL | 0 refills | 30.00 days
Start: 2023-08-07 — End: 2023-09-06

## 2023-08-07 MED FILL — KLOR-CON M20 MEQ TABLET,EXTENDED RELEASE: ORAL | 30 days supply | Qty: 30 | Fill #2

## 2023-08-07 MED FILL — MAGNESIUM OXIDE 400 MG (241.3 MG MAGNESIUM) TABLET: ORAL | 60 days supply | Qty: 120 | Fill #3

## 2023-08-08 MED ORDER — DEXTROAMPHETAMINE-AMPHETAMINE ER 20 MG 24HR CAPSULE,EXTEND RELEASE
ORAL_CAPSULE | Freq: Two times a day (BID) | ORAL | 0 refills | 30.00 days | Status: CP
Start: 2023-08-08 — End: 2023-08-08

## 2023-08-08 MED ORDER — CLONAZEPAM 0.5 MG TABLET
ORAL_TABLET | ORAL | 0 refills | 30 days | Status: CP
Start: 2023-08-08 — End: 2023-09-07

## 2023-08-10 MED ORDER — DEXTROAMPHETAMINE-AMPHETAMINE ER 20 MG 24HR CAPSULE,EXTEND RELEASE
ORAL_CAPSULE | Freq: Two times a day (BID) | ORAL | 0 refills | 30 days | Status: CP
Start: 2023-08-10 — End: 2023-09-09

## 2023-08-14 MED ORDER — DEXTROAMPHETAMINE-AMPHETAMINE ER 20 MG 24HR CAPSULE,EXTEND RELEASE
ORAL_CAPSULE | Freq: Two times a day (BID) | ORAL | 0 refills | 30.00 days | Status: CP
Start: 2023-08-14 — End: ?

## 2023-08-15 ENCOUNTER — Other Ambulatory Visit: Payer: Self-pay | Admitting: Family Medicine

## 2023-08-15 DIAGNOSIS — G959 Disease of spinal cord, unspecified: Secondary | ICD-10-CM

## 2023-08-17 ENCOUNTER — Ambulatory Visit
Admission: RE | Admit: 2023-08-17 | Discharge: 2023-08-17 | Disposition: A | Discharge status: Home / Self Care | Payer: Medicaid Other | Source: Ambulatory Visit | Attending: Family Medicine | Admitting: Family Medicine

## 2023-08-17 DIAGNOSIS — G959 Disease of spinal cord, unspecified: Secondary | ICD-10-CM

## 2023-08-22 ENCOUNTER — Encounter
Admit: 2023-08-22 | Discharge: 2023-08-23 | Payer: BLUE CROSS/BLUE SHIELD | Attending: Registered" | Primary: Registered"

## 2023-08-25 DIAGNOSIS — I7300 Raynaud's syndrome without gangrene: Principal | ICD-10-CM

## 2023-08-25 MED ORDER — NIFEDIPINE ER 30 MG TABLET,EXTENDED RELEASE
ORAL_TABLET | Freq: Every day | ORAL | 3 refills | 30.00 days
Start: 2023-08-25 — End: 2023-12-23

## 2023-08-25 MED ORDER — ONDANSETRON 8 MG DISINTEGRATING TABLET
ORAL_TABLET | Freq: Three times a day (TID) | ORAL | 0 refills | 20.00 days | PRN
Start: 2023-08-25 — End: ?

## 2023-08-25 MED ORDER — CYANOCOBALAMIN (VIT B-12) 1,000 MCG/ML INJECTION SOLUTION
SUBCUTANEOUS | 3 refills | 84.00 days
Start: 2023-08-25 — End: 2024-08-24

## 2023-08-29 DIAGNOSIS — I7300 Raynaud's syndrome without gangrene: Principal | ICD-10-CM

## 2023-08-29 MED ORDER — ONDANSETRON 8 MG DISINTEGRATING TABLET
ORAL_TABLET | Freq: Three times a day (TID) | ORAL | 4 refills | 20.00 days | Status: CP | PRN
Start: 2023-08-29 — End: ?

## 2023-08-29 MED ORDER — NIFEDIPINE ER 30 MG TABLET,EXTENDED RELEASE
ORAL_TABLET | Freq: Every day | ORAL | 3 refills | 30.00 days
Start: 2023-08-29 — End: 2023-12-27

## 2023-08-30 ENCOUNTER — Encounter: Admit: 2023-08-30 | Discharge: 2023-08-31 | Payer: BLUE CROSS/BLUE SHIELD | Attending: School | Primary: School

## 2023-09-01 MED ORDER — CYANOCOBALAMIN (VIT B-12) 1,000 MCG/ML INJECTION SOLUTION
SUBCUTANEOUS | 3 refills | 84.00 days
Start: 2023-09-01 — End: 2024-08-31

## 2023-09-01 MED FILL — RESTASIS 0.05 % EYE DROPS IN A DROPPERETTE: OPHTHALMIC | 90 days supply | Qty: 180 | Fill #3

## 2023-09-01 MED FILL — ONDANSETRON 8 MG DISINTEGRATING TABLET: ORAL | 20 days supply | Qty: 60 | Fill #0

## 2023-09-05 ENCOUNTER — Encounter: Admit: 2023-09-05 | Discharge: 2023-09-06 | Payer: BLUE CROSS/BLUE SHIELD

## 2023-09-05 MED ORDER — TRAZODONE 50 MG TABLET
ORAL_TABLET | Freq: Every evening | ORAL | 2 refills | 30.00 days | Status: CP
Start: 2023-09-05 — End: 2023-12-04

## 2023-09-05 MED ORDER — DIAZEPAM 10 MG TABLET
ORAL_TABLET | Freq: Every evening | ORAL | 0 refills | 30.00 days | Status: CP
Start: 2023-09-05 — End: 2023-10-05

## 2023-09-07 MED ORDER — TRAZODONE 50 MG TABLET
ORAL_TABLET | Freq: Every evening | ORAL | 2 refills | 30.00 days | Status: CP
Start: 2023-09-07 — End: 2023-12-06

## 2023-09-08 ENCOUNTER — Encounter: Admit: 2023-09-08 | Discharge: 2023-09-09 | Payer: BLUE CROSS/BLUE SHIELD | Attending: School | Primary: School

## 2023-09-09 MED ORDER — DEXTROAMPHETAMINE-AMPHETAMINE ER 20 MG 24HR CAPSULE,EXTEND RELEASE
ORAL_CAPSULE | Freq: Two times a day (BID) | ORAL | 0 refills | 30 days | Status: CP
Start: 2023-09-09 — End: 2023-10-09

## 2023-09-11 MED ORDER — DEXTROAMPHETAMINE-AMPHETAMINE ER 20 MG 24HR CAPSULE,EXTEND RELEASE
ORAL_CAPSULE | Freq: Two times a day (BID) | ORAL | 0 refills | 15.00 days | Status: CP
Start: 2023-09-11 — End: 2023-09-26

## 2023-09-12 ENCOUNTER — Ambulatory Visit: Admit: 2023-09-12 | Discharge: 2023-09-12 | Payer: BLUE CROSS/BLUE SHIELD

## 2023-09-12 DIAGNOSIS — K50018 Crohn's disease of small intestine with other complication: Principal | ICD-10-CM

## 2023-09-12 MED ORDER — MAGNESIUM 100 MG (AS GLYCINATE) TABLET
ORAL_TABLET | Freq: Every day | ORAL | 3 refills | 0.00 days | Status: CP
Start: 2023-09-12 — End: 2023-10-12
  Filled 2023-09-18: qty 120, 40d supply, fill #0

## 2023-09-13 MED ORDER — FOLIC ACID 1 MG TABLET
ORAL_TABLET | Freq: Every day | ORAL | 3 refills | 90.00 days | Status: CP
Start: 2023-09-13 — End: 2024-09-12
  Filled 2023-09-18: qty 450, 90d supply, fill #0

## 2023-09-13 MED ORDER — CYANOCOBALAMIN (VIT B-12) 1,000 MCG/ML INJECTION SOLUTION
SUBCUTANEOUS | 3 refills | 84.00 days | Status: CP
Start: 2023-09-13 — End: 2024-09-12

## 2023-09-13 MED ORDER — POTASSIUM CHLORIDE ER 20 MEQ TABLET,EXTENDED RELEASE(PART/CRYST)
ORAL_TABLET | Freq: Every day | ORAL | 2 refills | 30.00 days
Start: 2023-09-13 — End: ?

## 2023-09-14 ENCOUNTER — Encounter: Admit: 2023-09-14 | Discharge: 2023-09-15 | Payer: BLUE CROSS/BLUE SHIELD | Attending: School | Primary: School

## 2023-09-15 MED ORDER — POTASSIUM CHLORIDE ER 20 MEQ TABLET,EXTENDED RELEASE(PART/CRYST)
ORAL_TABLET | Freq: Every day | ORAL | 2 refills | 30.00 days | Status: CP
Start: 2023-09-15 — End: ?
  Filled 2023-09-18: qty 30, 30d supply, fill #0

## 2023-09-18 MED FILL — CYANOCOBALAMIN (VIT B-12) 1,000 MCG/ML INJECTION SOLUTION: SUBCUTANEOUS | 28 days supply | Qty: 2 | Fill #0

## 2023-09-20 ENCOUNTER — Encounter: Admit: 2023-09-20 | Discharge: 2023-09-21 | Payer: BLUE CROSS/BLUE SHIELD | Attending: School | Primary: School

## 2023-09-21 ENCOUNTER — Ambulatory Visit
Admit: 2023-09-21 | Discharge: 2023-09-22 | Payer: BLUE CROSS/BLUE SHIELD | Attending: Student in an Organized Health Care Education/Training Program | Primary: Student in an Organized Health Care Education/Training Program

## 2023-09-21 DIAGNOSIS — L719 Rosacea, unspecified: Principal | ICD-10-CM

## 2023-09-21 DIAGNOSIS — L720 Epidermal cyst: Principal | ICD-10-CM

## 2023-09-21 DIAGNOSIS — L578 Other skin changes due to chronic exposure to nonionizing radiation: Principal | ICD-10-CM

## 2023-09-21 DIAGNOSIS — L943 Sclerodactyly: Principal | ICD-10-CM

## 2023-09-21 DIAGNOSIS — R231 Pallor: Principal | ICD-10-CM

## 2023-09-21 MED ORDER — NIFEDIPINE ER 60 MG TABLET,EXTENDED RELEASE 24 HR
ORAL_TABLET | Freq: Every day | ORAL | 3 refills | 90.00 days | Status: CP
Start: 2023-09-21 — End: 2024-09-20

## 2023-09-26 ENCOUNTER — Encounter
Admit: 2023-09-26 | Discharge: 2023-09-27 | Payer: BLUE CROSS/BLUE SHIELD | Attending: Registered" | Primary: Registered"

## 2023-10-03 MED ORDER — DEXTROAMPHETAMINE-AMPHETAMINE ER 20 MG 24HR CAPSULE,EXTEND RELEASE
ORAL_CAPSULE | Freq: Two times a day (BID) | ORAL | 0 refills | 30.00 days | Status: CP
Start: 2023-10-03 — End: 2023-11-02

## 2023-10-15 DIAGNOSIS — L578 Other skin changes due to chronic exposure to nonionizing radiation: Principal | ICD-10-CM

## 2023-10-15 MED ORDER — BD LUER-LOK SYRINGE 3 ML 25 X 5/8"
2 refills | 700.00 days
Start: 2023-10-15 — End: ?

## 2023-10-15 MED ORDER — NIFEDIPINE ER 60 MG TABLET,EXTENDED RELEASE 24 HR
ORAL_TABLET | Freq: Every day | ORAL | 3 refills | 90.00 days
Start: 2023-10-15 — End: 2024-10-14

## 2023-10-15 MED ORDER — TRETINOIN 0.025 % TOPICAL CREAM
5 refills | 0.00 days
Start: 2023-10-15 — End: ?

## 2023-10-16 MED ORDER — BD LUER-LOK SYRINGE 3 ML 25 X 5/8"
2 refills | 700.00 days | Status: CP
Start: 2023-10-16 — End: ?
  Filled 2023-10-19: qty 2, 28d supply, fill #0

## 2023-10-16 MED ORDER — NIFEDIPINE ER 60 MG TABLET,EXTENDED RELEASE 24 HR
ORAL_TABLET | Freq: Every day | ORAL | 3 refills | 90.00 days | Status: CP
Start: 2023-10-16 — End: 2024-10-15

## 2023-10-16 MED ORDER — TRETINOIN 0.025 % TOPICAL CREAM
5 refills | 0.00 days | Status: CP
Start: 2023-10-16 — End: ?

## 2023-10-17 MED ORDER — NITROGLYCERIN 2 % TRANSDERMAL OINTMENT
1 refills | 0.00 days | Status: CP
Start: 2023-10-17 — End: ?

## 2023-10-20 MED ORDER — DIAZEPAM 10 MG TABLET
ORAL_TABLET | Freq: Every evening | ORAL | 0 refills | 30.00 days | Status: CP
Start: 2023-10-20 — End: 2023-11-19

## 2023-10-25 MED ORDER — NIFEDIPINE ER 30 MG TABLET,EXTENDED RELEASE 24 HR
ORAL_TABLET | Freq: Every day | ORAL | 3 refills | 90.00 days | Status: CP
Start: 2023-10-25 — End: 2024-10-24

## 2023-10-25 MED ORDER — NIFEDIPINE ER 60 MG TABLET,EXTENDED RELEASE 24 HR
ORAL_TABLET | Freq: Every day | ORAL | 3 refills | 90.00 days | Status: CN
Start: 2023-10-25 — End: 2024-10-24

## 2023-10-31 ENCOUNTER — Encounter

## 2023-10-31 ENCOUNTER — Encounter: Admit: 2023-10-31 | Discharge: 2023-11-01 | Payer: BLUE CROSS/BLUE SHIELD

## 2023-10-31 DIAGNOSIS — F4310 Post-traumatic stress disorder, unspecified: Principal | ICD-10-CM

## 2023-10-31 DIAGNOSIS — G894 Chronic pain syndrome: Principal | ICD-10-CM

## 2023-10-31 DIAGNOSIS — F419 Anxiety disorder, unspecified: Principal | ICD-10-CM

## 2023-10-31 MED ORDER — DEXTROAMPHETAMINE-AMPHETAMINE 20 MG TABLET
ORAL_TABLET | Freq: Two times a day (BID) | ORAL | 0 refills | 30 days | Status: CP
Start: 2023-10-31 — End: 2023-11-30

## 2023-10-31 MED ORDER — NICOTINE 21 MG/24 HR DAILY TRANSDERMAL PATCH
MEDICATED_PATCH | TRANSDERMAL | 3 refills | 30 days | Status: CP
Start: 2023-10-31 — End: 2024-02-28

## 2023-10-31 MED ORDER — QUETIAPINE 25 MG TABLET
ORAL_TABLET | Freq: Every evening | ORAL | 3 refills | 30 days | Status: CP
Start: 2023-10-31 — End: 2024-02-28

## 2023-10-31 MED ORDER — NICOTINE (POLACRILEX) 4 MG BUCCAL LOZENGE
BUCCAL | 0 refills | 9 days | Status: CP | PRN
Start: 2023-10-31 — End: 2024-02-28

## 2023-11-01 MED ORDER — CYCLOSPORINE 0.05 % EYE DROPS IN A DROPPERETTE
Freq: Two times a day (BID) | OPHTHALMIC | 3 refills | 90 days | Status: CP
Start: 2023-11-01 — End: 2024-10-26

## 2023-11-03 MED ORDER — MIRTAZAPINE 7.5 MG TABLET
ORAL_TABLET | Freq: Every evening | ORAL | 2 refills | 30 days | Status: CP
Start: 2023-11-03 — End: 2024-02-01

## 2023-11-04 DIAGNOSIS — K50818 Crohn's disease of both small and large intestine with other complication: Principal | ICD-10-CM

## 2023-11-04 MED ORDER — AZATHIOPRINE 50 MG TABLET
ORAL_TABLET | Freq: Every day | ORAL | 0 refills | 90 days
Start: 2023-11-04 — End: 2024-11-03

## 2023-11-06 DIAGNOSIS — Z79899 Other long term (current) drug therapy: Principal | ICD-10-CM

## 2023-11-06 DIAGNOSIS — K50018 Crohn's disease of small intestine with other complication: Principal | ICD-10-CM

## 2023-11-06 MED ORDER — MERCAPTOPURINE 50 MG TABLET
ORAL_TABLET | Freq: Every day | ORAL | 0 refills | 30 days | Status: CP
Start: 2023-11-06 — End: 2023-12-06

## 2023-11-08 MED ORDER — AZATHIOPRINE 50 MG TABLET
ORAL_TABLET | Freq: Every day | ORAL | 0 refills | 90 days
Start: 2023-11-08 — End: 2024-11-07

## 2023-11-08 MED FILL — KLOR-CON M20 MEQ TABLET,EXTENDED RELEASE: ORAL | 30 days supply | Qty: 30 | Fill #1

## 2023-11-08 MED FILL — CYANOCOBALAMIN (VIT B-12) 1,000 MCG/ML INJECTION SOLUTION: SUBCUTANEOUS | 28 days supply | Qty: 2 | Fill #1

## 2023-11-08 MED FILL — BD LUER-LOK SYRINGE 3 ML 25 X 5/8": 28 days supply | Qty: 2 | Fill #1

## 2023-11-08 MED FILL — ONDANSETRON 8 MG DISINTEGRATING TABLET: ORAL | 20 days supply | Qty: 60 | Fill #1

## 2023-11-10 ENCOUNTER — Encounter: Admit: 2023-11-10 | Discharge: 2023-11-11 | Attending: School | Primary: School

## 2023-11-16 DIAGNOSIS — L578 Other skin changes due to chronic exposure to nonionizing radiation: Principal | ICD-10-CM

## 2023-11-16 MED ORDER — RETIN-A 0.025 % TOPICAL GEL
0 refills | 0.00 days
Start: 2023-11-16 — End: ?

## 2023-11-17 MED ORDER — TRETINOIN 0.025 % TOPICAL GEL
0 refills | 0.00 days
Start: 2023-11-17 — End: ?

## 2023-11-26 MED ORDER — NICOTINE (POLACRILEX) 4 MG BUCCAL LOZENGE
BUCCAL | 0 refills | 9.00 days | PRN
Start: 2023-11-26 — End: 2024-03-25

## 2023-11-26 MED ORDER — NICOTINE 21 MG/24 HR DAILY TRANSDERMAL PATCH
MEDICATED_PATCH | TRANSDERMAL | 3 refills | 30.00 days
Start: 2023-11-26 — End: 2024-03-25

## 2023-11-26 MED ORDER — DEXTROAMPHETAMINE-AMPHETAMINE 20 MG TABLET
ORAL_TABLET | Freq: Two times a day (BID) | ORAL | 0 refills | 30.00 days
Start: 2023-11-26 — End: 2023-12-26

## 2023-11-27 DIAGNOSIS — R231 Pallor: Principal | ICD-10-CM

## 2023-11-27 DIAGNOSIS — I7300 Raynaud's syndrome without gangrene: Principal | ICD-10-CM

## 2023-11-27 MED ORDER — NITROGLYCERIN 2 % TRANSDERMAL OINTMENT
3 refills | 0.00 days | Status: CP
Start: 2023-11-27 — End: ?

## 2023-11-28 ENCOUNTER — Encounter: Admit: 2023-11-28 | Discharge: 2023-11-29 | Payer: Medicaid (Managed Care)

## 2023-11-28 DIAGNOSIS — G894 Chronic pain syndrome: Principal | ICD-10-CM

## 2023-11-28 DIAGNOSIS — F4310 Post-traumatic stress disorder, unspecified: Principal | ICD-10-CM

## 2023-11-28 DIAGNOSIS — F17200 Nicotine dependence, unspecified, uncomplicated: Principal | ICD-10-CM

## 2023-11-28 MED ORDER — NICOTINE (POLACRILEX) 4 MG BUCCAL LOZENGE
BUCCAL | 0 refills | 9.00 days | PRN
Start: 2023-11-28 — End: 2024-03-27

## 2023-11-28 MED ORDER — NICOTINE 21 MG/24 HR DAILY TRANSDERMAL PATCH
MEDICATED_PATCH | TRANSDERMAL | 3 refills | 30.00 days
Start: 2023-11-28 — End: 2024-03-27

## 2023-11-28 MED ORDER — DEXTROAMPHETAMINE-AMPHETAMINE 20 MG TABLET
ORAL_TABLET | Freq: Two times a day (BID) | ORAL | 0 refills | 30.00 days | Status: CP
Start: 2023-11-28 — End: 2023-12-28

## 2023-11-29 ENCOUNTER — Encounter

## 2023-11-30 ENCOUNTER — Ambulatory Visit
Admit: 2023-11-30 | Discharge: 2023-12-01 | Payer: Medicaid (Managed Care) | Attending: Hematology & Oncology | Primary: Hematology & Oncology

## 2023-11-30 DIAGNOSIS — M542 Cervicalgia: Principal | ICD-10-CM

## 2023-11-30 DIAGNOSIS — M25512 Pain in left shoulder: Principal | ICD-10-CM

## 2023-11-30 DIAGNOSIS — J209 Acute bronchitis, unspecified: Principal | ICD-10-CM

## 2023-11-30 DIAGNOSIS — R1310 Dysphagia, unspecified: Principal | ICD-10-CM

## 2023-11-30 DIAGNOSIS — I7300 Raynaud's syndrome without gangrene: Principal | ICD-10-CM

## 2023-11-30 DIAGNOSIS — J441 Chronic obstructive pulmonary disease with (acute) exacerbation: Principal | ICD-10-CM

## 2023-11-30 DIAGNOSIS — R231 Pallor: Principal | ICD-10-CM

## 2023-11-30 DIAGNOSIS — K50818 Crohn's disease of both small and large intestine with other complication: Principal | ICD-10-CM

## 2023-11-30 MED ORDER — ESOMEPRAZOLE MAGNESIUM 40 MG CAPSULE,DELAYED RELEASE
ORAL_CAPSULE | Freq: Two times a day (BID) | ORAL | 5 refills | 30.00 days
Start: 2023-11-30 — End: 2024-11-29

## 2023-11-30 MED ORDER — AZITHROMYCIN 250 MG TABLET
ORAL_TABLET | 0 refills | 5.00 days | Status: CP
Start: 2023-11-30 — End: 2023-12-05

## 2023-11-30 MED ORDER — ALBUTEROL SULFATE HFA 90 MCG/ACTUATION AEROSOL INHALER
Freq: Four times a day (QID) | RESPIRATORY_TRACT | 0 refills | 0.00 days | Status: CP | PRN
Start: 2023-11-30 — End: 2024-11-29

## 2023-11-30 MED ORDER — FAMOTIDINE 40 MG TABLET
ORAL_TABLET | Freq: Two times a day (BID) | ORAL | 3 refills | 45.00 days
Start: 2023-11-30 — End: 2024-11-29

## 2023-11-30 MED ORDER — METHYLPREDNISOLONE 4 MG TABLETS IN A DOSE PACK
Freq: Every day | ORAL | 0 refills | 21.00 days | Status: CP
Start: 2023-11-30 — End: 2024-11-29

## 2023-12-01 ENCOUNTER — Inpatient Hospital Stay: Admit: 2023-12-01 | Discharge: 2023-12-02 | Payer: Medicaid (Managed Care)

## 2023-12-01 ENCOUNTER — Encounter: Admit: 2023-12-01 | Discharge: 2023-12-02 | Payer: Medicaid (Managed Care) | Attending: School | Primary: School

## 2023-12-01 MED ORDER — VENTOLIN HFA 90 MCG/ACTUATION AEROSOL INHALER
Freq: Four times a day (QID) | RESPIRATORY_TRACT | 0 refills | 0.00000 days | Status: CP | PRN
Start: 2023-12-01 — End: 2024-11-30

## 2023-12-02 MED ORDER — ESOMEPRAZOLE MAGNESIUM 40 MG CAPSULE,DELAYED RELEASE
ORAL_CAPSULE | Freq: Two times a day (BID) | ORAL | 5 refills | 30.00000 days | Status: CP
Start: 2023-12-02 — End: 2024-12-01
  Filled 2023-12-07: qty 60, 30d supply, fill #0

## 2023-12-02 MED ORDER — FAMOTIDINE 40 MG TABLET
ORAL_TABLET | Freq: Two times a day (BID) | ORAL | 3 refills | 45.00000 days | Status: CP
Start: 2023-12-02 — End: 2024-12-01
  Filled 2024-02-21: qty 180, 90d supply, fill #0

## 2023-12-04 DIAGNOSIS — B370 Candidal stomatitis: Principal | ICD-10-CM

## 2023-12-04 DIAGNOSIS — R231 Pallor: Principal | ICD-10-CM

## 2023-12-04 DIAGNOSIS — I7300 Raynaud's syndrome without gangrene: Principal | ICD-10-CM

## 2023-12-04 MED ORDER — NITROGLYCERIN 2 % TRANSDERMAL OINTMENT
TRANSDERMAL | 3 refills | 0.00000 days | Status: CP
Start: 2023-12-04 — End: ?

## 2023-12-04 MED ORDER — FLUCONAZOLE 150 MG TABLET
ORAL_TABLET | Freq: Once | ORAL | 0 refills | 1.00000 days | Status: CP
Start: 2023-12-04 — End: 2023-12-04

## 2023-12-05 ENCOUNTER — Ambulatory Visit: Admit: 2023-12-05 | Discharge: 2023-12-06 | Payer: Medicaid (Managed Care)

## 2023-12-05 DIAGNOSIS — R140 Abdominal distension (gaseous): Principal | ICD-10-CM

## 2023-12-05 DIAGNOSIS — K50018 Crohn's disease of small intestine with other complication: Principal | ICD-10-CM

## 2023-12-05 MED ORDER — RIFAXIMIN 550 MG TABLET
ORAL_TABLET | Freq: Three times a day (TID) | ORAL | 0 refills | 14.00000 days | Status: CP
Start: 2023-12-05 — End: 2023-12-05

## 2023-12-06 DIAGNOSIS — K50018 Crohn's disease of small intestine with other complication: Principal | ICD-10-CM

## 2023-12-06 DIAGNOSIS — Z79899 Other long term (current) drug therapy: Principal | ICD-10-CM

## 2023-12-06 MED ORDER — MERCAPTOPURINE 50 MG TABLET
ORAL_TABLET | Freq: Every day | ORAL | 0 refills | 30.00000 days | Status: CP
Start: 2023-12-06 — End: 2024-01-05

## 2023-12-07 MED FILL — CYANOCOBALAMIN (VIT B-12) 1,000 MCG/ML INJECTION SOLUTION: SUBCUTANEOUS | 28 days supply | Qty: 2 | Fill #2

## 2023-12-07 MED FILL — ONDANSETRON 8 MG DISINTEGRATING TABLET: ORAL | 20 days supply | Qty: 60 | Fill #2

## 2023-12-07 MED FILL — KLOR-CON M20 MEQ TABLET,EXTENDED RELEASE: ORAL | 30 days supply | Qty: 30 | Fill #2

## 2023-12-09 MED ORDER — SIMETHICONE 80 MG CHEWABLE TABLET
ORAL_TABLET | Freq: Four times a day (QID) | ORAL | 0 refills | 8.00000 days | Status: CP | PRN
Start: 2023-12-09 — End: 2023-12-09

## 2023-12-14 ENCOUNTER — Ambulatory Visit
Admission: RE | Admit: 2023-12-14 | Discharge: 2023-12-14 | Disposition: A | Discharge status: Home / Self Care | Source: Ambulatory Visit | Attending: Internal Medicine | Admitting: Internal Medicine

## 2023-12-14 ENCOUNTER — Encounter

## 2023-12-14 DIAGNOSIS — Z1231 Encounter for screening mammogram for malignant neoplasm of breast: Secondary | ICD-10-CM | POA: Insufficient documentation

## 2023-12-17 DIAGNOSIS — J209 Acute bronchitis, unspecified: Principal | ICD-10-CM

## 2023-12-17 MED ORDER — AZITHROMYCIN 250 MG TABLET
ORAL_TABLET | ORAL | 0 refills | 5.00000 days | Status: CP
Start: 2023-12-17 — End: 2023-12-22

## 2023-12-19 ENCOUNTER — Encounter: Payer: Self-pay | Admitting: Internal Medicine

## 2023-12-20 ENCOUNTER — Other Ambulatory Visit: Payer: Self-pay | Admitting: Internal Medicine

## 2023-12-20 DIAGNOSIS — R928 Other abnormal and inconclusive findings on diagnostic imaging of breast: Secondary | ICD-10-CM

## 2023-12-20 MED ORDER — NICOTINE 14 MG/24 HR DAILY TRANSDERMAL PATCH
MEDICATED_PATCH | TRANSDERMAL | 2 refills | 30.00000 days | Status: CP
Start: 2023-12-20 — End: 2024-03-19

## 2023-12-20 MED ORDER — NICOTINE (POLACRILEX) 4 MG BUCCAL LOZENGE
BUCCAL | 0 refills | 9.00000 days | Status: CP | PRN
Start: 2023-12-20 — End: 2024-04-18

## 2023-12-22 ENCOUNTER — Ambulatory Visit
Admission: RE | Admit: 2023-12-22 | Discharge: 2023-12-22 | Disposition: A | Discharge status: Home / Self Care | Source: Ambulatory Visit | Attending: Internal Medicine | Admitting: Internal Medicine

## 2023-12-22 DIAGNOSIS — R928 Other abnormal and inconclusive findings on diagnostic imaging of breast: Secondary | ICD-10-CM | POA: Diagnosis present

## 2023-12-25 MED ORDER — DEXTROAMPHETAMINE-AMPHETAMINE 20 MG TABLET
ORAL_TABLET | Freq: Two times a day (BID) | ORAL | 0 refills | 30.00000 days
Start: 2023-12-25 — End: 2024-01-24

## 2023-12-25 MED ORDER — CLONAZEPAM 0.5 MG TABLET
ORAL_TABLET | Freq: Every day | ORAL | 0 refills | 14.00000 days | Status: CP | PRN
Start: 2023-12-25 — End: ?

## 2023-12-26 MED ORDER — DEXTROAMPHETAMINE-AMPHETAMINE 20 MG TABLET
ORAL_TABLET | Freq: Two times a day (BID) | ORAL | 0 refills | 30.00000 days | Status: CP
Start: 2023-12-26 — End: 2024-01-25

## 2023-12-29 MED ORDER — NIFEDIPINE ER 30 MG TABLET,EXTENDED RELEASE 24 HR
ORAL_TABLET | Freq: Every day | ORAL | 3 refills | 90.00000 days
Start: 2023-12-29 — End: 2024-12-28

## 2024-01-01 MED ORDER — NIFEDIPINE ER 30 MG TABLET,EXTENDED RELEASE 24 HR
ORAL_TABLET | Freq: Every day | ORAL | 0 refills | 90.00000 days | Status: CP
Start: 2024-01-01 — End: 2024-03-31
  Filled 2024-01-08: qty 90, 90d supply, fill #0

## 2024-01-02 DIAGNOSIS — K50018 Crohn's disease of small intestine with other complication: Principal | ICD-10-CM

## 2024-01-02 DIAGNOSIS — Z79899 Other long term (current) drug therapy: Principal | ICD-10-CM

## 2024-01-02 MED ORDER — MERCAPTOPURINE 50 MG TABLET
ORAL_TABLET | Freq: Every day | ORAL | 0 refills | 30.00000 days | Status: CP
Start: 2024-01-02 — End: 2024-02-01
  Filled 2024-01-08: qty 30, 30d supply, fill #0

## 2024-01-02 MED ORDER — POTASSIUM CHLORIDE ER 20 MEQ TABLET,EXTENDED RELEASE(PART/CRYST)
ORAL_TABLET | Freq: Every day | ORAL | 5 refills | 30.00000 days
Start: 2024-01-02 — End: ?

## 2024-01-05 MED ORDER — POTASSIUM CHLORIDE ER 20 MEQ TABLET,EXTENDED RELEASE(PART/CRYST)
ORAL_TABLET | Freq: Every day | ORAL | 5 refills | 30.00000 days | Status: CP
Start: 2024-01-05 — End: ?
  Filled 2024-01-08: qty 30, 30d supply, fill #0

## 2024-01-08 MED FILL — CYANOCOBALAMIN (VIT B-12) 1,000 MCG/ML INJECTION SOLUTION: SUBCUTANEOUS | 28 days supply | Qty: 2 | Fill #3

## 2024-01-08 MED FILL — ESOMEPRAZOLE MAGNESIUM 40 MG CAPSULE,DELAYED RELEASE: ORAL | 30 days supply | Qty: 60 | Fill #1

## 2024-01-12 ENCOUNTER — Encounter: Admit: 2024-01-12 | Discharge: 2024-01-13 | Payer: Medicaid (Managed Care) | Attending: School | Primary: School

## 2024-01-15 ENCOUNTER — Ambulatory Visit: Admit: 2024-01-15 | Discharge: 2024-01-16 | Payer: Medicaid (Managed Care)

## 2024-01-15 DIAGNOSIS — H524 Presbyopia: Principal | ICD-10-CM

## 2024-01-15 DIAGNOSIS — H5213 Myopia, bilateral: Principal | ICD-10-CM

## 2024-01-15 DIAGNOSIS — H52203 Unspecified astigmatism, bilateral: Principal | ICD-10-CM

## 2024-01-16 DIAGNOSIS — K50018 Crohn's disease of small intestine with other complication: Principal | ICD-10-CM

## 2024-01-16 DIAGNOSIS — Z79899 Other long term (current) drug therapy: Principal | ICD-10-CM

## 2024-01-25 DIAGNOSIS — F17200 Nicotine dependence, unspecified, uncomplicated: Principal | ICD-10-CM

## 2024-01-25 DIAGNOSIS — F4310 Post-traumatic stress disorder, unspecified: Principal | ICD-10-CM

## 2024-01-25 DIAGNOSIS — F909 Attention-deficit hyperactivity disorder, unspecified type: Principal | ICD-10-CM

## 2024-01-25 DIAGNOSIS — G894 Chronic pain syndrome: Principal | ICD-10-CM

## 2024-01-25 DIAGNOSIS — F419 Anxiety disorder, unspecified: Principal | ICD-10-CM

## 2024-01-25 MED ORDER — NICOTINE (POLACRILEX) 4 MG BUCCAL LOZENGE
BUCCAL | 0 refills | 9.00000 days | Status: CP | PRN
Start: 2024-01-25 — End: 2024-05-24

## 2024-01-25 MED ORDER — DEXTROAMPHETAMINE-AMPHETAMINE 20 MG TABLET
ORAL_TABLET | Freq: Two times a day (BID) | ORAL | 0 refills | 30.00000 days | Status: CP
Start: 2024-01-25 — End: 2024-02-24

## 2024-01-25 MED ORDER — MIRTAZAPINE 15 MG TABLET
ORAL_TABLET | Freq: Every evening | ORAL | 2 refills | 30.00000 days | Status: CP
Start: 2024-01-25 — End: 2024-04-24

## 2024-01-25 MED ORDER — NICOTINE 14 MG/24 HR DAILY TRANSDERMAL PATCH
MEDICATED_PATCH | TRANSDERMAL | 2 refills | 30.00000 days | Status: CP
Start: 2024-01-25 — End: 2024-04-24

## 2024-01-31 ENCOUNTER — Encounter: Payer: Self-pay | Admitting: Emergency Medicine

## 2024-01-31 ENCOUNTER — Ambulatory Visit: Admission: EM | Admit: 2024-01-31 | Discharge: 2024-01-31 | Disposition: A | Discharge status: Home / Self Care

## 2024-01-31 DIAGNOSIS — J069 Acute upper respiratory infection, unspecified: Secondary | ICD-10-CM | POA: Diagnosis not present

## 2024-01-31 DIAGNOSIS — S81831A Puncture wound without foreign body, right lower leg, initial encounter: Secondary | ICD-10-CM

## 2024-01-31 LAB — POCT RAPID STREP A (OFFICE): Rapid Strep A Screen: NEGATIVE

## 2024-01-31 MED ORDER — BENZONATATE 100 MG PO CAPS: 100.0000 mg | ORAL_CAPSULE | Freq: Three times a day (TID) | ORAL | 0 refills | Status: DC

## 2024-01-31 MED ORDER — PROMETHAZINE-DM 6.25-15 MG/5ML PO SYRP: 5.0000 mL | ORAL_SOLUTION | Freq: Every evening | ORAL | 0 refills | Status: DC | PRN

## 2024-01-31 MED ORDER — CEPHALEXIN 500 MG PO CAPS: 500.0000 mg | ORAL_CAPSULE | Freq: Four times a day (QID) | ORAL | 0 refills | Status: DC

## 2024-01-31 NOTE — Discharge Instructions (Signed)
 Today you are being treated for upper respiratory infection, on exam your lungs are clear and you are getting enough air at this time  Rapid strep test is negative for bacteria to the throat   wound to the right lower leg is not consistent with infection today on exam, redness of concern is most similar to bruising, if symptoms continue to persist please follow-up with your primary doctor for reevaluation as you may need further evaluation of your circulation and the leg tissues if no improvement  Begin cephalexin every 6 hours for 5 days, this provides coverage to the upper respiratory system as well as the skin  May use Tessalon Perles every 8 hours as needed for cough, may use cough syrup at bedtime if needed to help you rest    You can take Tylenol  and/or Ibuprofen  as needed for fever reduction and pain relief.   For cough: honey 1/2 to 1 teaspoon (you can dilute the honey in water  or another fluid).  You can also use guaifenesin  and dextromethorphan  for cough. You can use a humidifier for chest congestion and cough.  If you don't have a humidifier, you can sit in the bathroom with the hot shower running.      For sore throat: try warm salt water  gargles, cepacol lozenges, throat spray, warm tea or water  with lemon/honey, popsicles or ice, or OTC cold relief medicine for throat discomfort.   For congestion: take a daily anti-histamine like Zyrtec, Claritin, and a oral decongestant, such as pseudoephedrine.  You can also use Flonase 1-2 sprays in each nostril daily.   It is important to stay hydrated: drink plenty of fluids (water , gatorade/powerade/pedialyte, juices, or teas) to keep your throat moisturized and help further relieve irritation/discomfort.'

## 2024-01-31 NOTE — ED Triage Notes (Signed)
 Cough with yellow sputum and sore throat x 1 week. Had a biopsy in May 14 th now feels like her right leg is getting infected. Had check up with the doctor that did biopsy on June 12 th and he told her everything was fine. Took Mucinex  at 7:30 am
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
## 2024-02-05 DIAGNOSIS — F909 Attention-deficit hyperactivity disorder, unspecified type: Principal | ICD-10-CM

## 2024-02-08 DIAGNOSIS — L639 Alopecia areata, unspecified: Principal | ICD-10-CM

## 2024-02-08 DIAGNOSIS — R231 Pallor: Principal | ICD-10-CM

## 2024-02-08 DIAGNOSIS — L818 Other specified disorders of pigmentation: Principal | ICD-10-CM

## 2024-02-08 DIAGNOSIS — I7300 Raynaud's syndrome without gangrene: Principal | ICD-10-CM

## 2024-02-08 DIAGNOSIS — G629 Polyneuropathy, unspecified: Principal | ICD-10-CM

## 2024-02-08 DIAGNOSIS — L719 Rosacea, unspecified: Principal | ICD-10-CM

## 2024-02-08 DIAGNOSIS — L943 Sclerodactyly: Principal | ICD-10-CM

## 2024-02-08 MED ORDER — AZELAIC ACID 15 % TOPICAL GEL
TOPICAL | 5 refills | 0.00000 days | Status: CP
Start: 2024-02-08 — End: ?

## 2024-02-08 MED ORDER — LIDOCAINE 5 % TOPICAL CREAM
TOPICAL | 5 refills | 0.00000 days | Status: CP
Start: 2024-02-08 — End: ?

## 2024-02-18 MED ORDER — NIFEDIPINE ER 30 MG TABLET,EXTENDED RELEASE 24 HR
ORAL_TABLET | Freq: Every day | ORAL | 0 refills | 90.00000 days
Start: 2024-02-18 — End: 2024-05-18

## 2024-02-18 MED ORDER — DEXTROAMPHETAMINE-AMPHETAMINE 20 MG TABLET
ORAL_TABLET | Freq: Two times a day (BID) | ORAL | 0 refills | 30.00000 days
Start: 2024-02-18 — End: 2024-03-19

## 2024-02-19 MED ORDER — DEXTROAMPHETAMINE-AMPHETAMINE 20 MG TABLET
ORAL_TABLET | Freq: Two times a day (BID) | ORAL | 0 refills | 30.00000 days | Status: CP
Start: 2024-02-19 — End: 2024-03-20

## 2024-02-20 DIAGNOSIS — R231 Pallor: Principal | ICD-10-CM

## 2024-02-20 DIAGNOSIS — I7300 Raynaud's syndrome without gangrene: Principal | ICD-10-CM

## 2024-02-20 MED ORDER — NIFEDIPINE ER 30 MG TABLET,EXTENDED RELEASE 24 HR
ORAL_TABLET | Freq: Every day | ORAL | 0 refills | 90.00000 days | Status: CP
Start: 2024-02-20 — End: 2024-05-20
  Filled 2024-04-18: qty 90, 90d supply, fill #0

## 2024-02-20 MED ORDER — MIRTAZAPINE 30 MG TABLET
ORAL_TABLET | Freq: Every evening | ORAL | 2 refills | 30.00000 days | Status: CP
Start: 2024-02-20 — End: 2024-05-20

## 2024-02-20 MED ORDER — NITROGLYCERIN 2 % TRANSDERMAL OINTMENT
TRANSDERMAL | 3 refills | 0.00000 days | Status: CP
Start: 2024-02-20 — End: ?

## 2024-02-21 MED ORDER — TRETINOIN 0.05 % TOPICAL CREAM
TOPICAL | 5 refills | 0.00000 days | Status: CP
Start: 2024-02-21 — End: ?

## 2024-02-21 MED FILL — POTASSIUM CHLORIDE ER 20 MEQ TABLET,EXTENDED RELEASE(PART/CRYST): ORAL | 30 days supply | Qty: 30 | Fill #1

## 2024-02-21 MED FILL — ESOMEPRAZOLE MAGNESIUM 40 MG CAPSULE,DELAYED RELEASE: ORAL | 30 days supply | Qty: 60 | Fill #2

## 2024-02-21 MED FILL — ONDANSETRON 8 MG DISINTEGRATING TABLET: ORAL | 20 days supply | Qty: 60 | Fill #3

## 2024-02-21 MED FILL — FOLIC ACID 1 MG TABLET: ORAL | 90 days supply | Qty: 450 | Fill #1

## 2024-02-22 MED ORDER — DEXTROAMPHETAMINE-AMPHETAMINE 20 MG TABLET
ORAL_TABLET | Freq: Two times a day (BID) | ORAL | 0 refills | 30.00000 days | Status: CP
Start: 2024-02-22 — End: 2024-03-23

## 2024-03-04 DIAGNOSIS — K50018 Crohn's disease of small intestine with other complication: Principal | ICD-10-CM

## 2024-03-04 MED ORDER — SKYRIZI 360 MG/2.4 ML (150 MG/ML) SUBCUTANEOUS WEARABLE INJECTOR
SUBCUTANEOUS | 0 refills | 0.00000 days | Status: CP
Start: 2024-03-04 — End: ?
  Filled 2024-04-22: qty 2.4, 56d supply, fill #0

## 2024-03-06 DIAGNOSIS — K50018 Crohn's disease of small intestine with other complication: Principal | ICD-10-CM

## 2024-03-06 MED FILL — CYANOCOBALAMIN (VIT B-12) 1,000 MCG/ML INJECTION SOLUTION: SUBCUTANEOUS | 28 days supply | Qty: 2 | Fill #4

## 2024-03-06 MED FILL — BD LUER-LOK SYRINGE 3 ML 25 X 5/8": 28 days supply | Qty: 2 | Fill #2
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
## 2024-03-07 DIAGNOSIS — R21 Rash and other nonspecific skin eruption: Principal | ICD-10-CM

## 2024-03-07 DIAGNOSIS — M793 Panniculitis, unspecified: Principal | ICD-10-CM

## 2024-03-07 DIAGNOSIS — L943 Sclerodactyly: Principal | ICD-10-CM

## 2024-03-07 DIAGNOSIS — I7300 Raynaud's syndrome without gangrene: Principal | ICD-10-CM

## 2024-03-08 NOTE — Unmapped (Signed)
 [facility] Specialty Medication Onboarding    Specialty Medication: SKYRIZI  360 mg/2.4 mL (150 mg/mL) Injt (risankizumab -rzaa)  Prior Authorization: Approved   Financial Assistance: No - copay  <$25  Final Copay/Day Supply: $4 / 56    Insurance Restrictions: None     Notes to Pharmacist:   Credit Card on File: yes  Start Date on Rx:  03/04/24    The triage team has completed the benefits investigation and has determined that the patient is able to fill this medication at [facility]. Please contact the patient to complete the onboarding or follow up with the prescribing physician as needed.

## 2024-03-08 NOTE — Unmapped (Addendum)
 Infusions scheduled:    Skyrizi  Q8W approved for $4 copay.   Skyrizi  requires three IV inductions doses completed at weeks 0, 4, and 8 with the maintenance subcutaneous dose starting at week 12.   Delon Larve completed 1st and 2nd IV induction doses on 7/1 & 8/1. The 3rd IV induction ~8/29.      Patient outreach is scheduled the week of September 8th which is 2 weeks before first subcutaneous dose is due on 9/26.  Sent patient Mychart message with link to video and communicated outreach.       Harlene DELENA Elder, PharmD   [facility]  [address], [place], [place] [address]  Phone: [phone] - Fax. [phone]

## 2024-03-12 NOTE — Unmapped (Signed)
 Patient notified via [facility]. No further treatment needed.     Tracking: No     Diagnosis       Date                     Value               Ref Range           Status                03/07/2024                                                       Final             Right leg, punch   - Dermal pseudocystic change with fibrin, sparse inflammation, and focal    lipomembranous change.  See comment. [FORMATTING REMOVED]  ----------

## 2024-03-14 DIAGNOSIS — I7300 Raynaud's syndrome without gangrene: Principal | ICD-10-CM

## 2024-03-14 MED ORDER — NITROGLYCERIN 2 % TRANSDERMAL OINTMENT
TRANSDERMAL | 5 refills | 0.00000 days | Status: CP
Start: 2024-03-14 — End: ?

## 2024-03-15 MED FILL — POTASSIUM CHLORIDE ER 20 MEQ TABLET,EXTENDED RELEASE(PART/CRYST): ORAL | 30 days supply | Qty: 30 | Fill #2
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
## 2024-04-02 NOTE — Unmapped (Signed)
 Copied from CRM #[id number]. Topic: Scheduling - New Appointment  >> Apr 02, 2024  1:59 PM Lacinda SAILOR wrote:  Caller asked to schedule a new appointment      Additional Requests/Needs: Yes Appointment Related: Reason of the Call:   Patient is requesting to schedule a work-in appt for Possible Upper Respiratory Issue, nothing available at home office, checked [facility] and nothing available there, declined scheduling at [facility], please advise.      The patient preferred contact: Cell Phone Telephone Information:  Mobile          [phone]    Urgent callback turnaround time: within 24 business hours. Programmer, systems Notified)

## 2024-04-03 NOTE — Unmapped (Signed)
 Scheduled

## 2024-04-03 NOTE — Unmapped (Signed)
 Please see if Mary Hawkins has any availability otherwise may need to consider going to urgent care.

## 2024-04-04 DIAGNOSIS — J441 Chronic obstructive pulmonary disease with (acute) exacerbation: Principal | ICD-10-CM

## 2024-04-04 MED ORDER — AZITHROMYCIN 250 MG TABLET
ORAL_TABLET | ORAL | 0 refills | 5.00000 days | Status: CP
Start: 2024-04-04 — End: 2024-04-09

## 2024-04-04 MED ORDER — ALBUTEROL SULFATE HFA 90 MCG/ACTUATION AEROSOL INHALER
RESPIRATORY_TRACT | 0 refills | 0.00000 days | Status: CP
Start: 2024-04-04 — End: ?

## 2024-04-04 MED ORDER — AEROCHAMBER MV SPACER
0 refills | 0.00000 days | Status: CP
Start: 2024-04-04 — End: ?

## 2024-04-04 NOTE — Unmapped (Signed)
 Use your Ventolin  inhaler 2 puffs every 4-6 hrs with your spacer for wheezing.
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
## 2024-04-05 NOTE — Unmapped (Signed)
 Hi Dr. Alyse, she was seen in acute care for ongoing respiratory symptoms and R LE problems since skin biopsy last month. Can she be contacted to see you for follow up in the near future. Her complicated medial problems were outside the scope of my visit with her.    Thanks, Stephane Cassis FNP

## 2024-04-05 NOTE — Unmapped (Signed)
 Patient scheduled for next week 9/5 at 10:40.
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
## 2024-04-09 ENCOUNTER — Ambulatory Visit: Admit: 2024-04-09 | Discharge: 2024-04-09 | Payer: Medicaid (Managed Care)

## 2024-04-09 ENCOUNTER — Inpatient Hospital Stay: Admit: 2024-04-09 | Discharge: 2024-04-09 | Payer: Medicaid (Managed Care)

## 2024-04-09 DIAGNOSIS — R140 Abdominal distension (gaseous): Principal | ICD-10-CM

## 2024-04-09 DIAGNOSIS — R159 Full incontinence of feces: Principal | ICD-10-CM

## 2024-04-09 DIAGNOSIS — K50018 Crohn's disease of small intestine with other complication: Principal | ICD-10-CM

## 2024-04-09 DIAGNOSIS — F17200 Nicotine dependence, unspecified, uncomplicated: Principal | ICD-10-CM

## 2024-04-09 DIAGNOSIS — J441 Chronic obstructive pulmonary disease with (acute) exacerbation: Principal | ICD-10-CM

## 2024-04-09 DIAGNOSIS — S81801D Unspecified open wound, right lower leg, subsequent encounter: Principal | ICD-10-CM

## 2024-04-09 LAB — CBC W/ AUTO DIFF
BASOPHILS ABSOLUTE COUNT: 0.1 10*9/L (ref 0.0–0.1)
BASOPHILS RELATIVE PERCENT: 1.1 %
EOSINOPHILS ABSOLUTE COUNT: 0.2 10*9/L (ref 0.0–0.5)
EOSINOPHILS RELATIVE PERCENT: 2.7 %
HEMATOCRIT: 40.1 % (ref 34.0–44.0)
HEMOGLOBIN: 13.7 g/dL (ref 11.3–14.9)
LYMPHOCYTES ABSOLUTE COUNT: 2.6 10*9/L (ref 1.1–3.6)
LYMPHOCYTES RELATIVE PERCENT: 37 %
MEAN CORPUSCULAR HEMOGLOBIN CONC: 34.2 g/dL (ref 32.0–36.0)
MEAN CORPUSCULAR HEMOGLOBIN: 36.4 pg — ABNORMAL HIGH (ref 25.9–32.4)
MEAN CORPUSCULAR VOLUME: 106.6 fL — ABNORMAL HIGH (ref 77.6–95.7)
MEAN PLATELET VOLUME: 6.7 fL — ABNORMAL LOW (ref 6.8–10.7)
MONOCYTES ABSOLUTE COUNT: 0.5 10*9/L (ref 0.3–0.8)
MONOCYTES RELATIVE PERCENT: 7.3 %
NEUTROPHILS ABSOLUTE COUNT: 3.7 10*9/L (ref 1.8–7.8)
NEUTROPHILS RELATIVE PERCENT: 51.9 %
PLATELET COUNT: 401 10*9/L (ref 150–450)
RED BLOOD CELL COUNT: 3.76 10*12/L — ABNORMAL LOW (ref 3.95–5.13)
RED CELL DISTRIBUTION WIDTH: 14.8 % (ref 12.2–15.2)
WBC ADJUSTED: 7.1 10*9/L (ref 3.6–11.2)

## 2024-04-09 LAB — COMPREHENSIVE METABOLIC PANEL
ALBUMIN: 3.5 g/dL (ref 3.4–5.0)
ALKALINE PHOSPHATASE: 107 U/L (ref 46–116)
ALT (SGPT): 18 U/L (ref 10–49)
ANION GAP: 10 mmol/L (ref 5–14)
AST (SGOT): 17 U/L (ref <=34)
BILIRUBIN TOTAL: <0.2 mg/dL — ABNORMAL LOW (ref 0.3–1.2)
BLOOD UREA NITROGEN: 12 mg/dL (ref 9–23)
BUN / CREAT RATIO: 13
CALCIUM: 9.4 mg/dL (ref 8.7–10.4)
CHLORIDE: 109 mmol/L — ABNORMAL HIGH (ref 98–107)
CO2: 23.4 mmol/L (ref 20.0–31.0)
CREATININE: 0.9 mg/dL (ref 0.55–1.02)
EGFR CKD-EPI (2021) FEMALE: 82 mL/min/1.73m2 (ref >=60)
GLUCOSE RANDOM: 91 mg/dL (ref 70–179)
POTASSIUM: 4 mmol/L (ref 3.4–4.8)
PROTEIN TOTAL: 7 g/dL (ref 5.7–8.2)
SODIUM: 142 mmol/L (ref 135–145)

## 2024-04-09 LAB — IRON PANEL
IRON SATURATION: 14 % — ABNORMAL LOW (ref 20–55)
IRON: 51 ug/dL (ref 50–170)
TOTAL IRON BINDING CAPACITY: 373 ug/dL (ref 250–425)

## 2024-04-09 LAB — FERRITIN: FERRITIN: 21.7 ng/mL (ref 7.3–270.7)

## 2024-04-09 LAB — FOLATE: FOLATE: >24 ng/mL (ref >=5.4)

## 2024-04-09 LAB — C-REACTIVE PROTEIN: C-REACTIVE PROTEIN: 17.8 mg/L — ABNORMAL HIGH (ref <=10.0)

## 2024-04-09 LAB — VITAMIN B12: VITAMIN B-12: 570 pg/mL (ref 211–911)

## 2024-04-09 NOTE — Unmapped (Signed)
 [facility]    Appointments: [phone]  Nurse Contact: Larraine Patrick, RN [phone]   Fax: [phone]        Here are my recommendations:    Labs today    2. CT enterography, they will call to schedule    3. Continue skyrizi     4. Talk to derm about your skin biopsy site      Nurse Contact:   Larraine Patrick, RN: [phone]     Important phone numbers:  GI Clinic Appointments:  [phone] option 1 then option 1  GI Procedure Appointments:  [phone] option 1 then option 2  Radiology: [phone]

## 2024-04-09 NOTE — Unmapped (Signed)
 Addended by: KATRINE SHARPER B on: 04/09/2024 03:44 PM     Modules accepted: Orders

## 2024-04-10 ENCOUNTER — Inpatient Hospital Stay: Admit: 2024-04-10 | Discharge: 2024-04-10 | Payer: Medicaid (Managed Care)

## 2024-04-11 ENCOUNTER — Encounter
Admit: 2024-04-11 | Discharge: 2024-04-11 | Payer: Medicaid (Managed Care) | Attending: Student in an Organized Health Care Education/Training Program | Primary: Student in an Organized Health Care Education/Training Program

## 2024-04-11 DIAGNOSIS — L821 Other seborrheic keratosis: Principal | ICD-10-CM

## 2024-04-11 DIAGNOSIS — E538 Deficiency of other specified B group vitamins: Principal | ICD-10-CM

## 2024-04-11 DIAGNOSIS — L089 Local infection of the skin and subcutaneous tissue, unspecified: Principal | ICD-10-CM

## 2024-04-11 DIAGNOSIS — Z720 Tobacco use: Principal | ICD-10-CM

## 2024-04-11 LAB — VITAMIN D 25 HYDROXY: VITAMIN D, TOTAL (25OH): 42.3 ng/mL (ref 20.0–80.0)

## 2024-04-11 MED ORDER — DOXYCYCLINE HYCLATE 100 MG TABLET
ORAL_TABLET | Freq: Two times a day (BID) | ORAL | 0 refills | 10.00000 days | Status: CP
Start: 2024-04-11 — End: 2024-04-21

## 2024-04-11 MED ORDER — CYANOCOBALAMIN (VIT B-12) 1,000 MCG/ML INJECTION SOLUTION
Freq: Once | SUBCUTANEOUS | 0 refills | 1.00000 days | Status: CP
Start: 2024-04-11 — End: 2024-04-11

## 2024-04-11 MED ORDER — MUPIROCIN 2 % TOPICAL OINTMENT
TOPICAL | 5 refills | 0.00000 days | Status: CP
Start: 2024-04-11 — End: ?

## 2024-04-11 NOTE — Unmapped (Signed)
 Dermatology Note    Assessment and Plan     Assessment & Plan  Local skin infection of lower leg  Local skin infection on the lower leg, likely secondary to a biopsy site, contributing to elevated CRP levels. Previously more extensive, now significantly improved. Suspected to cause systemic symptoms, including elevated CRP and possible respiratory symptoms.  - Order aerobic swab for culture.  - Prescribe topical mupirocin  ointment.  - Prescribe oral doxycycline  100 mg bid x 10 days to be taken with food to prevent nausea.  - Advise cleaning the area with Dial antibacterial soap.  - Advise applying antibiotic ointment and covering with a bandage.  - Advise avoiding dirty water and continue swimming in chlorinated pools.    Seborrheic keratosis  Seborrheic keratosis, a benign skin growth, common with aging and not a skin cancer.    Pulmonary symptoms  Experiencing respiratory symptoms, including congestion and mucus production, with a normal chest x-ray. Concern for underlying issues not detected by x-ray. Current smoker, which may contribute to respiratory symptoms.  - Order CT scan of the chest without contrast to evaluate for underlying pathology.  - Provide contact information for scheduling the CT scan at a convenient location.    RTC: Return in about 4 months (around 08/11/2024).     Subjective     History of Present Illness  Mary Hawkins is a 43 year old female with Crohn's disease who presents with a leg wound and elevated CRP levels.    She has a leg wound that has not healed as expected. Initially, the wound extended almost around her leg but has since reduced significantly in size. She applies Aquaphor ointment but forgot to cover it with a Band-Aid. She uses a swimming pool with chlorine, which she was told is safe for her leg wound as long as it is not dirty water. A past biopsy of the leg wound showed a resolving hematoma. She is concerned about her elevated CRP levels, which she believes might be related to her leg wound. She does not feel well overall.    ROS: Negative unless otherwise noted    Objective     Physical Exam:  - Overall well appearing with some raspiness to voice  - Right lower leg with erythematous edematous plaque with central biopsy site with some purulent exudate  - Stuck-on brown, tan and grey papillated papules on right lower leg

## 2024-04-11 NOTE — Unmapped (Signed)
 Radiology scheduling: [id number]

## 2024-04-11 NOTE — Unmapped (Signed)
 IRON  THERAPY PLAN ORDER ENTRY DOCUMENTATION    The following order(s) have been placed for insurance authorization:    Therapy Plan Order  Medication: ferumoxytol (Feraheme)  Dose & Frequency: 510 mg IV weekly x 2 doses  Diagnosis: Iron  Deficiency & Crohn's Disease  Treatment Center: [facility]      Relevant Iron  Labs:  Lab Results   Component Value Date    HGB 13.7 04/09/2024    IRON  51 04/09/2024    FERRITIN 21.7 04/09/2024    LABIRON 14 (L) 04/09/2024         Requesting Physician: Dr. Marry Ritaccio

## 2024-04-11 NOTE — Unmapped (Signed)
 Called patient to discuss the results of her labs with elevated CRP and borderline iron  deficiency. Normal folate and B12 but does have macrocytosis. We discussed concern for possible inflammation secondary to Crohn's given her GI symptoms and she is planned for CT enterography. Will order fixed dose IV iron  to replete iron  stores and also prescribe additional B12 shot at patient request for fatigue.       [name], MD  Assistant Professor of Medicine  [facility]  Division of Gastroenterology & Hepatology  [employer]  [employer]

## 2024-04-12 DIAGNOSIS — R053 Chronic cough: Principal | ICD-10-CM

## 2024-04-12 DIAGNOSIS — J208 Acute bronchitis due to other specified organisms: Principal | ICD-10-CM

## 2024-04-12 DIAGNOSIS — R062 Wheezing: Principal | ICD-10-CM

## 2024-04-12 MED ORDER — METHYLPREDNISOLONE 4 MG TABLETS IN A DOSE PACK
ORAL | 0 refills | 0.00000 days | Status: CP
Start: 2024-04-12 — End: 2024-04-12

## 2024-04-12 MED ORDER — IPRATROPIUM 0.5 MG-ALBUTEROL 3 MG (2.5 MG BASE)/3 ML NEBULIZATION SOLN
Freq: Three times a day (TID) | RESPIRATORY_TRACT | 1 refills | 9.00000 days | Status: CP | PRN
Start: 2024-04-12 — End: 2025-04-12

## 2024-04-12 MED ADMIN — triamcinolone acetonide (KENALOG-40) injection 80 mg: 80 mg | INTRAMUSCULAR | @ 16:00:00 | Stop: 2024-04-12

## 2024-04-12 MED ADMIN — ipratropium-albuterol (DUO-NEB) 0.5-2.5 mg/3 mL nebulizer solution 3 mL: 3 mL | RESPIRATORY_TRACT | @ 16:00:00 | Stop: 2024-04-12
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
## 2024-04-16 LAB — HEPATIC FUNCTION PANEL
ALKALINE PHOSPHATASE: 108 U/L
ALT (SGPT): 14 U/L
AST (SGOT): 15 U/L

## 2024-04-16 LAB — CBC
HEMATOCRIT: 40.9 %
HEMOGLOBIN: 13.5 g/dL
MEAN CORPUSCULAR VOLUME: 109.1 fL — ABNORMAL HIGH
PLATELET COUNT: 357 10*9/L
RED BLOOD CELL COUNT: 3.75 10*12/L — ABNORMAL LOW
WBC ADJUSTED: 6 10*9/L

## 2024-04-16 LAB — C-REACTIVE PROTEIN: C-REACTIVE PROTEIN: 87.5 mg/L — ABNORMAL HIGH

## 2024-04-16 NOTE — Unmapped (Signed)
 Labs DOS 04/05/24, full report available in media tab date 04/15/24. Will enter in Epic results.

## 2024-04-17 NOTE — Unmapped (Signed)
 Iron  infusion scheduled for 04/23/24 and 04/26/24

## 2024-04-18 MED ORDER — EMPTY CONTAINER
2 refills | 0.00000 days
Start: 2024-04-18 — End: ?

## 2024-04-18 MED FILL — CYANOCOBALAMIN (VIT B-12) 1,000 MCG/ML INJECTION SOLUTION: SUBCUTANEOUS | 30 days supply | Qty: 1 | Fill #0

## 2024-04-18 MED FILL — ESOMEPRAZOLE MAGNESIUM 40 MG CAPSULE,DELAYED RELEASE: ORAL | 90 days supply | Qty: 180 | Fill #3

## 2024-04-18 MED FILL — POTASSIUM CHLORIDE ER 20 MEQ TABLET,EXTENDED RELEASE(PART/CRYST): ORAL | 90 days supply | Qty: 90 | Fill #3

## 2024-04-18 NOTE — Unmapped (Signed)
 [facility]    Patient Onboarding/Medication Counseling    Mary Hawkins is a 43 y.o. female with Crohn's disease who I am counseling today on initiation of therapy.  I am speaking to the patient.    Was a Nurse, learning disability used for this call? No    Verified patient's date of birth / HIPAA.    Specialty medication(s) to be sent: Inflammatory Disorders: Skyrizi       Non-specialty medications/supplies to be sent: Sharps container      Medications not needed at this time: n/a         Skyrizi  (risankizumab )    Medication & Administration     Dosage: Crohn's disease: Inject 360mg  under the skin every 8 weeks starting 4 weeks after 3rd IV induction dose (Completed at weeks 0, 4, and 8)    Lab tests required prior to treatment initiation:  Tuberculosis: Tuberculosis screening resulted in a non-reactive Quantiferon TB Gold assay.    Administration:     Air traffic controller all supplies needed for injection on a clean, flat working surface: Plastic tray containing the On-body injector and prefilled cartridge, alcohol swab, sharps container, etc.  Remove unopened carton from the refrigerator and allow it to warm up to room temperature for at least 45 but no more than 90 minutes.  Look at the medication label - look for correct medication, correct dose, and check the expiration date  Remove the On-body injector and prefilled cartridge from plastic tray  Look at the On-body injector - check that it is intact and undamaged. Do NOT close the gray door before the prefilled cartridge is loaded  Look at the prefilled cartridge - the liquid should appear clear and colorless to slightly yellow, you may see tiny white or clear particles. Do NOT twist or remove cartridge top.  Use an alcohol swab to clean the smaller bottom tip of the prefilled cartridge and let it air dry. Do not touch the smaller bottom tip after cleaning.  Insert the smaller bottom tip into the On-Body injector first. Firmly push down until you hear a click. There may be a few drops of medicine on the back of the On-body Injector. That is normal. Close the gray door and squeeze firmly until it snaps closed.   You MUST start the injection within 5 minutes of loading the prefilled cartridge.   Select injection site - you can use the front of your thigh or your belly (but not the area 2 inches around your belly button)  Prepare injection site - wash your hands and clean the skin at the injection site with an alcohol swab and let it air dry, do not touch the injection site again before the injection  Peel the green tabs on the back of the On-body injector to expose the adhesive. This will activate the On-body injector and cause the status light to turn BLUE.   For the belly, move and hold the skin to create a firm, flat surface. You do not need to pull the skin if injecting on the front of the thighs.  When the blue light flashes, it is ready to start the injection.  Place the On-body injector onto the cleaned skin and then firmly press the gray button until you hear a click. This will start the injection and the light will continuously flash GREEN. This may take up to 5 minutes to complete the full dose.  The On-body Injector will automatically stop when the injection is finished.  You will hear beeps and the status light will change to SOLID GREEN.   Remove the On-Body Injector by carefully peeling the adhesive from your skin. Avoid touching the needle or needle cover on the back of the On-Body Injector. You will hear several beeps and the status light will turn off.   Dispose of the used On-Body Injector immediately in Engineer, water.      Adherence/Missed dose instructions:  If your injection is given more than 7 days after your scheduled injection date - consult your pharmacist for additional instructions on how to adjust your dosing schedule.    Goals of Therapy     Crohn's Disease  Achieve remission of symptoms  Maintain remission of symptoms  Minimize long-term systemic glucocorticoid use  Prevent need for surgical procedures  Maintenance of effective psychosocial functioning      Side Effects & Monitoring Parameters     Injection site reaction (redness, irritation, inflammation localized to the site of administration)  Signs of a common cold - minor sore throat, runny or stuffy nose, etc.  Felling tired/weak  Headache  Stomach, joint or back pain    The following side effects should be reported to the provider:  Signs of a hypersensitivity reaction - rash; hives; itching; red, swollen, blistered, or peeling skin; wheezing; tightness in the chest or throat; difficulty breathing, swallowing, or talking; swelling of the mouth, face, lips, tongue, or throat; etc.  Reduced immune function - report signs of infection such as fever; chills; body aches; very bad sore throat; ear or sinus pain; cough; more sputum or change in color of sputum; pain with passing urine; wound that will not heal, etc.  Also at a slightly higher risk of some malignancies (mainly skin and blood cancers) due to this reduced immune function.  In the case of signs of infection - the patient should hold the next dose of Skyrizi ?? and call your primary care provider to ensure adequate medical care.  Treatment may be resumed when infection is treated and patient is asymptomatic.  Flu-like symptoms  Warm, red, or painful skin or sores on the body  Severe diarrhea or stomach pain      Contraindications, Warnings, & Precautions     Have your bloodwork checked as you have been told by your prescriber  Talk with your doctor if you are pregnant, planning to become pregnant, or breastfeeding  Discuss the possible need for holding your dose(s) of Skyrizi ?? when a planned procedure is scheduled with the prescriber as it may delay healing/recovery timeline       Drug/Food Interactions     Medication list reviewed in Epic. The patient was instructed to inform the care team before taking any new medications or supplements. No drug interactions identified.   Protopic  (topical tacrolimus ) interaction noted but patient is not using due to insurance restrictions. She never received medication.   Talk with you prescriber or pharmacist before receiving any live vaccinations while taking this medication and after you stop taking it    Storage, Handling Precautions, & Disposal     Store this medication in the refrigerator.  Do not freeze   If needed, you may store at room temperature for up to 24 hours  Store in original packaging, protected from light  Do not shake  Dispose of used syringes/pens in a sharps disposal container          Current Medications (including OTC/herbals), Comorbidities and Allergies     Current Medications[1]  Allergies[2]    Problem List[3]    Medication list has been reviewed and updated in Epic: Yes    Allergies have been reviewed and updated in Epic: Yes    Appropriateness of Therapy     Acute infections noted within Epic:  No active infections  Patient reported infection: None    Is the medication and dose appropriate based on diagnosis, medication list, comorbidities, allergies, medical history, patient???s ability to self-administer the medication, and therapeutic goals? Yes    Prescription has been clinically reviewed: Yes      Baseline Quality of Life Assessment      How many days over the past month did your Crohn's disease  keep you from your normal activities? For example, brushing your teeth or getting up in the morning. 0    Financial Information     Medication Assistance provided: Prior Authorization    Anticipated copay of $4 / 56 days reviewed with patient. Verified delivery address.    Delivery Information     Scheduled delivery date: 04/23/2024    Expected start date: 04/29/2024 (per clinic RN)      Medication will be delivered via UPS to the prescription address in [facility].  This shipment will not require a signature.      Explained the services we provide at [facility] and that each month we would call to set up refills.  Stressed importance of returning phone calls so that we could ensure they receive their medications in time each month.  Informed patient that we should be setting up refills 7-10 days prior to when they will run out of medication.  A pharmacist will reach out to perform a clinical assessment periodically.  Informed patient that a welcome packet, containing information about our pharmacy and other support services, a Notice of Privacy Practices, and a drug information handout will be sent.      The patient or caregiver noted above participated in the development of this care plan and knows that they can request review of or adjustments to the care plan at any time.      Patient or caregiver verbalized understanding of the above information as well as how to contact the pharmacy at [phone] option 4 with any questions/concerns.  The pharmacy is open Monday through Friday 8:30am-4:30pm.  A pharmacist is available 24/7 via pager to answer any clinical questions they may have.    Patient Specific Needs     Does the patient have any physical, cognitive, or cultural barriers? No    Does the patient have adequate living arrangements? (i.e. the ability to store and take their medication appropriately) Yes    Did you identify any home environmental safety or security hazards? No    Patient prefers to have medications discussed with  Patient     Is the patient or caregiver able to read and understand education materials at a high school level or above? Yes    Patient's primary language is  English     Is the patient high risk? No    Does the patient have an additional or emergency contact listed in their chart? Yes    SOCIAL DETERMINANTS OF HEALTH     At the [facility], we have learned that life circumstances - like trouble affording food, housing, utilities, or transportation can affect the health of many of our patients. That is why we wanted to ask: are you currently experiencing any life circumstances that are  negatively impacting your health and/or quality of life? Patient declined to answer    Social Drivers of Health     Food Insecurity: Patient Declined (01/10/2024)    Received from [facility]    Hunger Vital Sign     Within the past 12 months, you worried that your food would run out before you got the money to buy more.: Patient declined     Within the past 12 months, the food you bought just didn't last and you didn't have money to get more.: Patient declined   Tobacco Use: High Risk (04/14/2024)    Patient History     Smoking Tobacco Use: Every Day     Smokeless Tobacco Use: Never     Passive Exposure: Not on file   Transportation Needs: Patient Declined (01/10/2024)    Received from [facility] - Transportation     Lack of Transportation (Medical): Patient declined     Lack of Transportation (Non-Medical): Patient declined   Alcohol Use: Not At Risk (02/19/2024)    Received from [facility]    Alcohol     Audit-C Score: 0   Housing: Patient Declined (01/10/2024)    Received from [facility] Stability Vital Sign     In the last 12 months, was there a time when you were not able to pay the mortgage or rent on time?: Patient declined     Number of Times Moved in the Last Year: Not on file     At any time in the past 12 months, were you homeless or living in a shelter (including now)?: Patient declined   Physical Activity: Unknown (01/10/2024)    Received from [facility]    Exercise Vital Sign     On average, how many days per week do you engage in moderate to strenuous exercise (like a brisk walk)?: Patient declined     Minutes of Exercise per Session: Not on file   Utilities: Patient Declined (01/10/2024)    Received from [facility] Utilities     Threatened with loss of utilities: Patient declined   Stress: Patient Declined (01/10/2024)    Received from [facility] - Occupational Stress Questionnaire     Feeling of Stress : Patient declined   Interpersonal Safety: Not At Risk (04/09/2024)    Interpersonal Safety     Unsafe Where You Currently Live: No     Physically Hurt by Anyone: No     Abused by Anyone: No   Substance Use: Not on file (06/12/2023)   Intimate Partner Violence: Unknown (01/10/2024)    Received from [facility]    HITS     Over the last 12 months how often did your partner physically hurt you?: Never     Over the last 12 months how often did your partner insult you or talk down to you?: Patient declined     Over the last 12 months how often did your partner threaten you with physical harm?: Never     Scream or Curse: Not on file   Social Connections: Not on file   Financial Resource Strain: Patient Declined (01/10/2024)    Received from [facility]    Overall Financial Resource Strain (CARDIA)     Difficulty of Paying Living Expenses: Patient declined   Health Literacy: Not on file   Internet Connectivity: Internet connectivity concern unknown (11/30/2023)  Internet Connectivity     Do you have access to internet services: Yes     How do you connect to the internet: Not on file     Is your internet connection strong enough for you to watch video on your device without major problems?: Not on file     Do you have enough data to get through the month?: Not on file     Does at least one of the devices have a camera that you can use for video chat?: Not on file       Would you be willing to receive help with any of the needs that you have identified today? Not applicable       Velma DELENA Eon, PharmD  [facility] Specialty Pharmacist       [1]   Current Outpatient Medications   Medication Sig Dispense Refill    acetaminophen  (TYLENOL ) 500 MG tablet Take 2 tablets (1,000 mg total) by mouth every six (6) hours as needed for pain.      albuterol  HFA 90 mcg/actuation inhaler Inhale 2 puffs every 4-6 hrs with spacer for wheezing 6.7 g 0    amitriptyline (ELAVIL) 75 MG tablet Take 1 tablet (75 mg total) by mouth.      aripiprazole (ABILIFY) 2 MG tablet Take 1 tablet (2 mg total) by mouth every morning.      aspirin -acetaminophen -caffeine  (EXCEDRIN  MIGRAINE) 250-250-65 mg per tablet Take 1 tablet by mouth.      azelaic acid  15 % gel After skin is thoroughly washed and patted dry, gently but thoroughly massage a thin film of azelaic acid  gel into the face daily 50 g 5    baclofen  (LIORESAL ) 20 MG tablet Take 1 tablet (20 mg total) by mouth four (4) times a day.      cetirizine (ZYRTEC) 10 MG tablet       chlorhexidine (PERIDEX) 0.12 % solution       cholecalciferol , vitD3,/vit K2 (VITAMIN D3-VITAMIN K2) 250 mcg (10,000 unit)-45 mcg cap Take by mouth. 100 mcg K2      clonazePAM  (KLONOPIN ) 1 MG disintegrating tablet PLEASE SEE ATTACHED FOR DETAILED DIRECTIONS      cyanocobalamin , vitamin B-12, 1,000 mcg/mL injection Inject 1 mL (1,000 mcg total) under the skin once for 1 dose. 1 mL 0    cycloSPORINE  (RESTASIS ) 0.05 % ophthalmic emulsion Administer 1 drop to both eyes Two (2) times a day. 180 each 3    dextroamphetamine -amphetamine  (ADDERALL ) 20 mg tablet Take 1 tablet (20 mg total) by mouth two (2) times a day.      doxycycline  (ADOXA) 100 MG tablet Take 1 tablet (100 mg total) by mouth two (2) times a day.      doxycycline  (VIBRA -TABS) 100 MG tablet Take 1 tablet (100 mg total) by mouth two (2) times a day for 10 days. 20 tablet 0    esomeprazole  (NEXIUM ) 40 MG capsule Take 1 capsule (40 mg total) by mouth two (2) times a day. 60 capsule 5    famotidine  (PEPCID ) 40 MG tablet Take 1 tablet (40 mg total) by mouth two (2) times a day. 90 tablet 3    folic acid  (FOLVITE ) 1 MG tablet Take 5 tablets (5 mg total) by mouth daily. 450 tablet 3    gabapentin (NEURONTIN) 800 MG tablet Take 1 tablet (800 mg total) by mouth Four (4) times a day with a meal and nightly.      ibuprofen (MOTRIN) 800  MG tablet       inhalational spacing device (AEROCHAMBER MV) Spcr Use with inhaler 1 each 0    ipratropium-albuterol  (DUO-NEB) 0.5-2.5 mg/3 mL nebulizer Inhale 3 mL by nebulization every eight (8) hours as needed. 75 mL 1    lidocaine  (ELA-MAX) 5 % cream Apply to affected area as needed for itch or pain twice daily to affected area on legs 30 g 5    magnesium  glycinate 100 mg magnesium  cap       mupirocin  (BACTROBAN ) 2 % ointment Apply to affected area of the skin up to three times per day as needed. 30 g 5    naloxone  (NARCAN ) 4 mg nasal spray       NIFEdipine  (PROCARDIA  XL) 30 MG 24 hr tablet Take 1 tablet (30 mg total) by mouth daily. 90 tablet 0    ondansetron  (ZOFRAN -ODT) 8 MG disintegrating tablet Dissolve 1 tablet (8 mg total) on the tongue every eight (8) hours as needed for nausea. 60 tablet 4    oxyCODONE  (ROXICODONE ) 15 MG immediate release tablet       potassium chloride  (KLOR-CON  M20) 20 MEQ ER tablet Take 1 tablet (20 mEq total) by mouth daily. 30 tablet 5    promethazine-dextromethorphan (PROMETHAZINE-DM) 6.25-15 mg/5 mL syrup Take 5 mL by mouth.      risankizumab -rzaa (SKYRIZI ) 360 mg/2.4 mL (150 mg/mL) Injt Inject 360mg  with OBI at week 12 post completion of IV loading doses, then every 8weeks maintenance 2.4 mL 0    simethicone  (MYLICON) 80 MG chewable tablet Chew 1 tablet (80 mg total) every six (6) hours as needed for flatulence. 30 tablet 0    simethicone  (PHAZYME) 500 mg cap       sulfaSALAzine 500 MG EC tablet Take 2 tablets (1,000 mg total) by mouth.      syringe with needle (BD LUER-LOK SYRINGE) 3 mL 25 x 5/8 Syrg Use one every fourteen (14) days. 50 each 2    tacrolimus  (PROTOPIC ) 0.03 % ointment Apply two times a day on weekdays to areas of hair thinning/loss on the eyebrows 100 g 1    tretinoin  (RETIN-A ) 0.05 % cream Apply 2-3 times weekly at night to dry skin after cleaning. Increase frequency up to nightly as tolerated. 20 g 5    triamcinolone  (KENALOG ) 0.1 % ointment Apply two times a day on weekends to areas of hair thinning/loss on the eyebrows and two times a day as needed to active areas of eczema until skin is smooth. 80 g 1    varenicline tartrate (CHANTIX PAK) 0.5 mg (11)- 1 mg (42) tablet FOLLOW STARTER PACK INSTRUCTIONS FOR SMOKING CESSATION      VENTOLIN  HFA 90 mcg/actuation inhaler Inhale 2 puffs every six (6) hours as needed for wheezing. 18 g 0     No current facility-administered medications for this visit.   [2]   Allergies  Allergen Reactions    Doxycycline  Other (See Comments)     Reaction not recalled      Other Reaction(s): other      doxycycline  hydrochloride      doxycycline     Penicillins Hives     Has patient had a PCN reaction causing immediate rash, facial/tongue/throat swelling, SOB or lightheadedness with hypotension: Yes    Has patient had a PCN reaction causing severe rash involving mucus membranes or skin necrosis: Unk    Has patient had a PCN reaction that required hospitalization: Yes    Has patient had a PCN reaction occurring  within the last 10 years: No    If all of the above answers are NO, then may proceed with Cephalosporin use.    Other Reaction(s): HIVES, SOB    Doxycycline  Hcl Other (See Comments)     Reaction not recalled   [3]   Patient Active Problem List  Diagnosis    Generalized abdominal pain    Attention deficit hyperactivity disorder (ADHD)    Allergic rhinitis    Anxiety state    Cobalamin deficiency    Brachial neuritis    Drug dependence, continuous abuse    (CMS-HCC)    Headache    Migraine without aura    Regional enteritis of small intestine    (CMS-HCC)    Endometriosis    Esophageal reflux    Essential hypertension    Thoracic or lumbosacral neuritis or radiculitis    Pain, joint, multiple sites    Neuralgia and neuritis    Tobacco abuse    Erosive esophagitis    Microcytic anemia    Crohn's disease of small intestine with other complication    (CMS-HCC)    Infliximab (Remicade) long-term use    Myopia of both eyes with astigmatism    Uses contact lenses    Eczema Chronic fatigue syndrome with fibromyalgia    Fibromyalgia    Chronic pain syndrome    Dry eye    Vitamin D  deficiency    Arthritis of facet joint of cervical spine    Acute bilateral low back pain with bilateral sciatica    Bilateral bunions    Chronic pain of lower extremity, bilateral    Chronic sacroiliac joint pain    Closed fracture of right hip requiring operative repair with delayed healing    Crohn's disease of small and large intestines    (CMS-HCC)    DDD (degenerative disc disease), lumbar    Hypokalemia    Insomnia disorder    Intra-abdominal fluid collection    Leukocytosis    Lumbar radiculitis    Mild renal insufficiency    Neuritis or radiculitis due to rupture of lumbar intervertebral disc    Osteoarthritis    Plantar fasciitis, bilateral    Polysubstance abuse (CMS-HCC)    PTSD (post-traumatic stress disorder)    Radiculopathy    Raynaud's disease    Restless legs syndrome (RLS)    Thrombocytosis    Uncomplicated asthma (HHS-HCC)    Visual disturbances

## 2024-04-18 NOTE — Unmapped (Signed)
 Entered in error This encounter was created in error - please disregard.

## 2024-04-22 MED FILL — EMPTY CONTAINER: 120 days supply | Qty: 1 | Fill #0

## 2024-04-24 NOTE — Unmapped (Signed)
 Hi Dr. Canda,   Received message that CT requires Peer to Peer.  Returned TC to [employer] and spoke with [employer].CT is scheduled for tomorrow and is pending authorization. Peer to Peer is needed.  Number to call for Peer to Peer is [phone].  Thanks!  Doyal

## 2024-04-25 DIAGNOSIS — R197 Diarrhea, unspecified: Principal | ICD-10-CM

## 2024-04-25 MED ORDER — RIFAXIMIN 550 MG TABLET
ORAL_TABLET | Freq: Three times a day (TID) | ORAL | 0 refills | 14.00000 days | Status: CP
Start: 2024-04-25 — End: 2024-05-09

## 2024-04-25 NOTE — Unmapped (Signed)
 Called back [employer], is now authorized with authorization number [id number], which is valid for dates 9/16-12/14/2025

## 2024-04-30 NOTE — Progress Notes (Signed)
 Chief Complaint:   Chief Complaint  Patient presents with  . Right Foot - Follow-up    Discuss bunion surgery, also bump on right toe.     Subjective  HPI  [name] is a 43 y.o. female who presents for Follow-up of the Right Foot (Discuss bunion surgery, also bump on right toe. ) History of Present Illness [name] is a 43 year old female with inflammatory arthritis who presents with bilateral foot pain.  She experiences recurring pain in both feet despite previous treatments. A 'little knot' has appeared, which is not painful. She has received three cortisone injections, with the last one in May and the previous one in January, providing temporary relief before the pain returned.  She has not been consistently stretching, although stretching has been beneficial in the past. She reports a subtle area on her foot that she describes as a 'little knot' which does not hurt.  She discusses her bunions, noting that the second toe is starting to overlap significantly, and she is losing toenails. She is considering surgery for the bunions, which would require coordination with her rheumatology care due to her inflammatory arthritis.  She is currently on multiple medications for her inflammatory arthritis, which limits the addition of new medications for her foot pain.  Review of Systems  Patient Active Problem List  Diagnosis  . Alcohol abuse  . Allergic rhinitis  . Anxiety state  . Arthritis of facet joint of cervical spine  . Attention deficit hyperactivity disorder (ADHD)  . Bilateral bunions  . Bilateral shoulder pain  . Brachial neuritis  . Cervical radiculitis  . Chronic pain of lower extremity, bilateral  . Chronic sacroiliac joint pain  . Closed fracture of right hip requiring operative repair with delayed healing  . Cobalamin deficiency  . Crohn's disease of both small and large intestine (CMS/HHS-HCC)  . Crohn's disease of small intestine (CMS/HHS-HCC)  . DDD  (degenerative disc disease), lumbar  . Chronic narcotic dependence (CMS/HHS-HCC)  . Dry eye  . Dysesthesia  . Eczema, unspecified  . Endometriosis  . Erosive esophagitis  . Esophageal reflux  . Essential hypertension  . Chronic fatigue syndrome with fibromyalgia  . Fracture of femoral neck, right, closed (CMS/HHS-HCC)  . Acute bilateral low back pain with bilateral sciatica  . Hypocalcemia  . Hypokalemia  . Hypomagnesemia  . Infliximab (Remicade) long-term use  . Insomnia disorder  . Leukocytosis  . Intra-abdominal fluid collection  . Major depressive disorder  . Iron deficiency anemia  . Migraine without aura  . Mild renal insufficiency  . Mood disorder ()  . Myopia of both eyes with astigmatism  . Neuralgia and neuritis  . Neuritis or radiculitis due to rupture of lumbar intervertebral disc  . Osteoarthritis  . Pain, joint, multiple sites  . Painful orthopaedic hardware ()  . Plantar fasciitis, bilateral  . Polysubstance abuse (CMS/HHS-HCC)  . PTSD (post-traumatic stress disorder)  . Radiculopathy  . Raynaud's disease  . Rectal prolapse  . Restless legs syndrome (RLS)  . Rosacea  . Seizures (CMS/HHS-HCC)  . Subarachnoid hemorrhage (CMS/HHS-HCC)  . Thrombocytosis  . Tobacco abuse  . Crohn's disease (CMS/HHS-HCC)  . Uncomplicated asthma (HHS-HCC)  . Uses contact lenses  . Visual disturbances  . Vitamin D  deficiency    Outpatient Medications Prior to Visit  Medication Sig Dispense Refill  . aspirin-acetaminophen -caffeine (EXCEDRIN MIGRAINE) 250-250-65 mg per tablet Take by mouth every 6 (six) hours as needed    . baclofen  (LIORESAL ) 20 MG tablet  Take by mouth    . BD LUER-LOK SYRINGE 3 mL 25 x 5/8 Syrg     . chlorhexidine  (PERIDEX ) 0.12 % solution RINSE MOUTH WITH (1 CAPFUL) FOR 30 SECONDS IN MORNING AND EVENING AFTER BRUSHING, THEN SPIT    . cholecalciferol (VITAMIN D3) 1000 unit tablet Take 25 mcg by mouth every morning before breakfast (0630)    .  cyanocobalamin  (VITAMIN B12) 1,000 mcg/mL injection Inject subcutaneously    . dextroamphetamine -amphetamine  (ADDERALL XR) 20 MG XR capsule Take 20 mg by mouth 2 (two) times daily    . ergocalciferol , vitamin D2, 1,250 mcg (50,000 unit) capsule Take by mouth    . ferrous sulfate  325 (65 FE) MG tablet Take 325 mg by mouth once daily    . gabapentin  (NEURONTIN ) 800 MG tablet Take by mouth    . magnesium  oxide (MAG-OX) 400 mg (241.3 mg magnesium ) tablet Take 400 mg by mouth 2 (two) times daily    . ondansetron  (ZOFRAN -ODT) 8 MG disintegrating tablet Take by mouth every 8 (eight) hours as needed    . oxyCODONE  (OXYIR) 10 mg immediate release tablet TAKE 1 TABLET EVERY 4 HOURS (NO EARLY REFILL 10/06)    . potassium chloride  (KLOR-CON  M20) 20 MEQ ER tablet Take 1 tablet by mouth once daily    . SKYRIZI 360 mg/2.4 mL (150 mg/mL) injector Inject 360 mg subcutaneously once    . sulfaSALAzine (AZULFIDINE EN-TABS) 500 mg EC tablet Take 2 tablets (1,000 mg total) by mouth 2 (two) times daily 360 tablet 0  . tetrahydrozoline-zinc (VISINE-AC) 0.05-0.25 % ophthalmic solution at bedtime as needed    . tretinoin (RETIN-A) 0.025 % cream Apply a pea sized amount to your entire face every other night then increase to nightly as tolerated. OK to mix with moisturizer if experiencing dryness.    . inFLIXimab (REMICADE) 100 mg injection Inject into the vein (Patient not taking: Reported on 04/30/2024)    . tacrolimus (PROTOPIC) 0.03 % ointment Apply two times a day on weekdays to areas of hair thinning/loss on the eyebrows    . triamcinolone  0.1 % ointment Apply two times a day on weekends to areas of hair thinning/loss on the eyebrows and two times a day as needed to active areas of eczema until skin is smooth.     No facility-administered medications prior to visit.      Objective  Vitals:   04/30/24 1149  BP: 120/78  Pulse: 87  PainSc:   5  PainLoc: Foot   There is no height or weight on file to calculate  BMI.  Home Vitals:     Physical Exam MUSCULOSKELETAL: Subtle palpable mass on foot, possibly fibrotic tissue or vein.  This is directly overlying the second MTPJ.  Severe pain with palpation of the plantar fascia insertion bilaterally.  No lateral calcaneal wall pain.  She has a rectus foot type.  Hallux valgus deformity noted bilaterally.   The following labs were personally reviewed: - Lab Results  Component Value Date   WBC 10.5 (H) 04/10/2024   HGB 14.0 04/10/2024   HCT 41.3 04/10/2024   PLT 353 04/10/2024   - Lab Results  Component Value Date   NA 142 08/18/2023   K 3.4 (L) 08/18/2023   CL 108 08/18/2023   CO2 25.9 08/18/2023   BUN 10 08/18/2023   CREATININE 1.0 04/10/2024   GLUCOSE 90 08/18/2023   - Lab Results  Component Value Date   NA 142 08/18/2023   K 3.4 (  L) 08/18/2023   CL 108 08/18/2023   CO2 25.9 08/18/2023   BUN 10 08/18/2023   CREATININE 1.0 04/10/2024   CALCIUM  9.4 08/18/2023   ALB 3.9 04/10/2024   TBILI 0.3 04/10/2024   ALKPHOS 101 04/10/2024   AST 14 04/10/2024   ALT 14 04/10/2024   GLUCOSE 90 08/18/2023   GFR 72 04/10/2024   -No results found for: HGBA1C    Results      Assessment/Plan:   Assessment & Plan Bilateral plantar fasciitis Chronic bilateral plantar fasciitis with recurrent pain. Previous treatment included three cortisone injections, the last in May. Pain has recurred, and stretching exercises have not been consistently performed. A small, non-painful knot is present, likely benign fibrotic tissue or a small spur, not concerning at this time. Cortisone injections decrease inflammation but require supportive measures to maintain improvement. Avoidance of additional oral steroids due to current medication regimen for inflammatory arthritis. - Administer cortisone injection to decrease inflammation. - Advise regular stretching exercises, specifically runner's stretch, several times a day. - Recommend supportive  footwear, avoiding flip flops and sandals. - Suggest massaging the heel with a tennis ball and applying ice. - Monitor the small knot for changes in size or symptoms.  Plantar fascial injection performed today. Injected the both feet at plantar fascial insertion medially with 4mg  dexamethasone , 20mg  kenalog  40, and 1 cc of lidocaine  plain. Pt tolerated procedure well.   Hallux valgus deformity - [employer] forms are stable at this point.  She is starting to notice subtle overlap of the second toe to the great toe and is strongly considering surgical intervention in the future.  We discussed boot immobilization for about 6 weeks and then progressive ambulation there but after . we discussed he can only perform 1 foot at a time.  She have to wait a little while before performing the contralateral foot.  Inflammatory arthritis - She is seen routinely by rheumatology.  She is on injectable medication.  Would have to coordinate with rheumatology if we were to consider surgical invention.  Diagnoses and all orders for this visit:  Plantar fasciitis  Bilateral foot pain  Valgus deformity of both great toes          Future Appointments     Date/Time Provider Department Center Visit Type   05/10/2024 8:40 AM Defoor, Elsie Conch, PA [place] [facility] RHEUM RETURN VISIT       There are no Patient Instructions on file for this visit.   This note has been created using automated tools and reviewed for accuracy by JUSTIN ALLEN FOWLER.

## 2024-05-02 DIAGNOSIS — J069 Acute upper respiratory infection, unspecified: Principal | ICD-10-CM

## 2024-05-02 MED ORDER — LEVOFLOXACIN 500 MG TABLET
ORAL_TABLET | Freq: Every day | ORAL | 0 refills | 10.00000 days | Status: CP
Start: 2024-05-02 — End: 2024-05-12

## 2024-05-05 ENCOUNTER — Inpatient Hospital Stay (HOSPITAL_COMMUNITY)
Admission: EM | Admit: 2024-05-05 | Discharge: 2024-05-09 | DRG: 871 | Discharge status: Against Medical Advice | Attending: Internal Medicine | Admitting: Internal Medicine

## 2024-05-05 ENCOUNTER — Emergency Department (HOSPITAL_COMMUNITY)

## 2024-05-05 ENCOUNTER — Other Ambulatory Visit: Payer: Self-pay

## 2024-05-05 DIAGNOSIS — K219 Gastro-esophageal reflux disease without esophagitis: Secondary | ICD-10-CM | POA: Diagnosis present

## 2024-05-05 DIAGNOSIS — E876 Hypokalemia: Secondary | ICD-10-CM | POA: Diagnosis present

## 2024-05-05 DIAGNOSIS — R6520 Severe sepsis without septic shock: Secondary | ICD-10-CM | POA: Diagnosis present

## 2024-05-05 DIAGNOSIS — Z5329 Procedure and treatment not carried out because of patient's decision for other reasons: Secondary | ICD-10-CM | POA: Diagnosis not present

## 2024-05-05 DIAGNOSIS — D84821 Immunodeficiency due to drugs: Secondary | ICD-10-CM | POA: Diagnosis present

## 2024-05-05 DIAGNOSIS — J188 Other pneumonia, unspecified organism: Secondary | ICD-10-CM | POA: Diagnosis present

## 2024-05-05 DIAGNOSIS — J440 Chronic obstructive pulmonary disease with acute lower respiratory infection: Secondary | ICD-10-CM | POA: Diagnosis present

## 2024-05-05 DIAGNOSIS — Z1152 Encounter for screening for COVID-19: Secondary | ICD-10-CM | POA: Diagnosis not present

## 2024-05-05 DIAGNOSIS — Z7960 Long term (current) use of unspecified immunomodulators and immunosuppressants: Secondary | ICD-10-CM

## 2024-05-05 DIAGNOSIS — R079 Chest pain, unspecified: Secondary | ICD-10-CM

## 2024-05-05 DIAGNOSIS — G894 Chronic pain syndrome: Secondary | ICD-10-CM | POA: Diagnosis present

## 2024-05-05 DIAGNOSIS — J189 Pneumonia, unspecified organism: Secondary | ICD-10-CM | POA: Diagnosis present

## 2024-05-05 DIAGNOSIS — F39 Unspecified mood [affective] disorder: Secondary | ICD-10-CM | POA: Diagnosis present

## 2024-05-05 DIAGNOSIS — J9601 Acute respiratory failure with hypoxia: Principal | ICD-10-CM | POA: Diagnosis present

## 2024-05-05 DIAGNOSIS — Z803 Family history of malignant neoplasm of breast: Secondary | ICD-10-CM

## 2024-05-05 DIAGNOSIS — A419 Sepsis, unspecified organism: Secondary | ICD-10-CM | POA: Diagnosis present

## 2024-05-05 DIAGNOSIS — J704 Drug-induced interstitial lung disorders, unspecified: Secondary | ICD-10-CM | POA: Diagnosis present

## 2024-05-05 DIAGNOSIS — M545 Low back pain, unspecified: Secondary | ICD-10-CM | POA: Diagnosis present

## 2024-05-05 DIAGNOSIS — Z800 Family history of malignant neoplasm of digestive organs: Secondary | ICD-10-CM

## 2024-05-05 DIAGNOSIS — Z880 Allergy status to penicillin: Secondary | ICD-10-CM

## 2024-05-05 DIAGNOSIS — E538 Deficiency of other specified B group vitamins: Secondary | ICD-10-CM | POA: Diagnosis present

## 2024-05-05 DIAGNOSIS — F1911 Other psychoactive substance abuse, in remission: Secondary | ICD-10-CM | POA: Diagnosis present

## 2024-05-05 DIAGNOSIS — I10 Essential (primary) hypertension: Secondary | ICD-10-CM | POA: Diagnosis present

## 2024-05-05 DIAGNOSIS — Z8711 Personal history of peptic ulcer disease: Secondary | ICD-10-CM

## 2024-05-05 DIAGNOSIS — R071 Chest pain on breathing: Secondary | ICD-10-CM | POA: Diagnosis not present

## 2024-05-05 DIAGNOSIS — L97919 Non-pressure chronic ulcer of unspecified part of right lower leg with unspecified severity: Secondary | ICD-10-CM | POA: Diagnosis present

## 2024-05-05 DIAGNOSIS — G40909 Epilepsy, unspecified, not intractable, without status epilepticus: Secondary | ICD-10-CM | POA: Diagnosis present

## 2024-05-05 DIAGNOSIS — Z79899 Other long term (current) drug therapy: Secondary | ICD-10-CM | POA: Diagnosis not present

## 2024-05-05 DIAGNOSIS — G629 Polyneuropathy, unspecified: Secondary | ICD-10-CM | POA: Diagnosis present

## 2024-05-05 DIAGNOSIS — K5090 Crohn's disease, unspecified, without complications: Secondary | ICD-10-CM | POA: Diagnosis present

## 2024-05-05 DIAGNOSIS — S72001D Fracture of unspecified part of neck of right femur, subsequent encounter for closed fracture with routine healing: Secondary | ICD-10-CM | POA: Diagnosis not present

## 2024-05-05 DIAGNOSIS — M797 Fibromyalgia: Secondary | ICD-10-CM | POA: Diagnosis present

## 2024-05-05 DIAGNOSIS — Z832 Family history of diseases of the blood and blood-forming organs and certain disorders involving the immune mechanism: Secondary | ICD-10-CM

## 2024-05-05 DIAGNOSIS — F17210 Nicotine dependence, cigarettes, uncomplicated: Secondary | ICD-10-CM | POA: Diagnosis present

## 2024-05-05 DIAGNOSIS — J441 Chronic obstructive pulmonary disease with (acute) exacerbation: Secondary | ICD-10-CM | POA: Diagnosis present

## 2024-05-05 DIAGNOSIS — M542 Cervicalgia: Secondary | ICD-10-CM | POA: Diagnosis present

## 2024-05-05 DIAGNOSIS — Z9049 Acquired absence of other specified parts of digestive tract: Secondary | ICD-10-CM

## 2024-05-05 DIAGNOSIS — F419 Anxiety disorder, unspecified: Secondary | ICD-10-CM | POA: Diagnosis present

## 2024-05-05 DIAGNOSIS — F909 Attention-deficit hyperactivity disorder, unspecified type: Secondary | ICD-10-CM | POA: Diagnosis present

## 2024-05-05 LAB — CBC WITH DIFFERENTIAL/PLATELET
Abs Immature Granulocytes: 0.46 K/uL — ABNORMAL HIGH (ref 0.00–0.07)
Basophils Absolute: 0.1 K/uL (ref 0.0–0.1)
Basophils Relative: 1 %
Eosinophils Absolute: 0.2 K/uL (ref 0.0–0.5)
Eosinophils Relative: 1 %
HCT: 42.6 % (ref 36.0–46.0)
Hemoglobin: 13.5 g/dL (ref 12.0–15.0)
Immature Granulocytes: 3 %
Lymphocytes Relative: 7 %
Lymphs Abs: 1.3 K/uL (ref 0.7–4.0)
MCH: 35.5 pg — ABNORMAL HIGH (ref 26.0–34.0)
MCHC: 31.7 g/dL (ref 30.0–36.0)
MCV: 112.1 fL — ABNORMAL HIGH (ref 80.0–100.0)
Monocytes Absolute: 0.3 K/uL (ref 0.1–1.0)
Monocytes Relative: 2 %
Neutro Abs: 15.1 K/uL — ABNORMAL HIGH (ref 1.7–7.7)
Neutrophils Relative %: 86 %
Platelets: 235 K/uL (ref 150–400)
RBC: 3.8 MIL/uL — ABNORMAL LOW (ref 3.87–5.11)
RDW: 14.2 % (ref 11.5–15.5)
Smear Review: NORMAL
WBC: 17.3 K/uL — ABNORMAL HIGH (ref 4.0–10.5)
nRBC: 0.1 % (ref 0.0–0.2)

## 2024-05-05 LAB — PROCALCITONIN: Procalcitonin: 0.6 ng/mL

## 2024-05-05 LAB — I-STAT CG4 LACTIC ACID, ED: Lactic Acid, Venous: 1.1 mmol/L (ref 0.5–1.9)

## 2024-05-05 LAB — COMPREHENSIVE METABOLIC PANEL WITH GFR
ALT: 22 U/L (ref 0–44)
AST: 39 U/L (ref 15–41)
Albumin: 2.9 g/dL — ABNORMAL LOW (ref 3.5–5.0)
Alkaline Phosphatase: 175 U/L — ABNORMAL HIGH (ref 38–126)
Anion gap: 14 (ref 5–15)
BUN: 15 mg/dL (ref 6–20)
CO2: 17 mmol/L — ABNORMAL LOW (ref 22–32)
Calcium: 9.1 mg/dL (ref 8.9–10.3)
Chloride: 104 mmol/L (ref 98–111)
Creatinine, Ser: 1.15 mg/dL — ABNORMAL HIGH (ref 0.44–1.00)
GFR, Estimated: >60 mL/min (ref >60)
Glucose, Bld: 110 mg/dL — ABNORMAL HIGH (ref 70–99)
Potassium: 3.3 mmol/L — ABNORMAL LOW (ref 3.5–5.1)
Sodium: 135 mmol/L (ref 135–145)
Total Bilirubin: 0.3 mg/dL (ref 0.0–1.2)
Total Protein: 7.1 g/dL (ref 6.5–8.1)

## 2024-05-05 LAB — HCG, SERUM, QUALITATIVE: Preg, Serum: NEGATIVE

## 2024-05-05 LAB — PROTIME-INR
INR: 1 (ref 0.8–1.2)
Prothrombin Time: 13.5 s (ref 11.4–15.2)

## 2024-05-05 LAB — RESP PANEL BY RT-PCR (RSV, FLU A&B, COVID)  RVPGX2
Influenza A by PCR: NEGATIVE
Influenza B by PCR: NEGATIVE
Resp Syncytial Virus by PCR: NEGATIVE
SARS Coronavirus 2 by RT PCR: NEGATIVE

## 2024-05-05 MED ORDER — LACTATED RINGERS IV SOLN
INTRAVENOUS | Status: DC
Start: 2024-05-05 — End: 2024-05-06

## 2024-05-05 MED ORDER — AZITHROMYCIN 500 MG IV SOLR
500.0000 mg | Freq: Once | INTRAVENOUS | Status: AC
  Administered 2024-05-06: 500 mg via INTRAVENOUS
  Filled 2024-05-05: qty 5

## 2024-05-05 MED ORDER — IOHEXOL 350 MG/ML SOLN
75.0000 mL | Freq: Once | INTRAVENOUS | Status: AC | PRN
Start: 2024-05-05 — End: 2024-05-05
  Administered 2024-05-05: 75 mL via INTRAVENOUS

## 2024-05-05 MED ORDER — SODIUM CHLORIDE 0.9 % IV SOLN
2.0000 g | Freq: Once | INTRAVENOUS | Status: AC
  Administered 2024-05-05: 2 g via INTRAVENOUS
  Filled 2024-05-05: qty 20

## 2024-05-05 MED ORDER — OXYCODONE HCL 5 MG PO TABS
15.0000 mg | ORAL_TABLET | Freq: Once | ORAL | Status: AC
  Administered 2024-05-05: 15 mg via ORAL
  Filled 2024-05-05: qty 3

## 2024-05-05 MED ORDER — LORAZEPAM 1 MG PO TABS
1.0000 mg | ORAL_TABLET | Freq: Once | ORAL | Status: AC
  Administered 2024-05-05: 1 mg via ORAL
  Filled 2024-05-05: qty 1

## 2024-05-05 MED ORDER — ACETAMINOPHEN 500 MG PO TABS
1000.0000 mg | ORAL_TABLET | ORAL | Status: AC
  Administered 2024-05-05: 1000 mg via ORAL
  Filled 2024-05-05: qty 2

## 2024-05-05 MED ORDER — IPRATROPIUM-ALBUTEROL 0.5-2.5 (3) MG/3ML IN SOLN
3.0000 mL | Freq: Once | RESPIRATORY_TRACT | Status: AC
  Administered 2024-05-05: 3 mL via RESPIRATORY_TRACT
  Filled 2024-05-05: qty 3

## 2024-05-05 NOTE — ED Notes (Signed)
 Nasal 02 at 4

## 2024-05-05 NOTE — ED Notes (Signed)
 Unable to get 2nd set of cultures in triage

## 2024-05-05 NOTE — ED Provider Notes (Signed)
 [name] EMERGENCY DEPARTMENT AT [facility] Provider Note   CSN: [id number] Arrival date & time: 05/05/24  8095     Patient presents with: Shortness of Breath   [name] is a 43 y.o. female.   Shortness of Breath  Patient is a 43 year old female with PMH of asthma and UC/Crohn on IL 23 antagonist immunotherapy presenting to the ED with chief complaint of worsening shortness of breath and dry cough since April, acutely worsened over the last 4 days.  Patient denies any recent headaches, vision changes, fevers, chills, vomiting, acute abdominal pain, changes in urination, or changes in bowel movements.  No lower extremity swelling.  No recent travel or prolonged immobilization.   Endorses chronic nausea and chest tightness.  Note, patient reports that she has been following with her PCP who had plans to initiate an outpatient CT chest in the setting of ongoing symptoms.  Patient denies any recent travel.  No new skin findings.  No rashes.  Patient reported chronic wound on RLE lateral ankle that has been previously biopsied.  Prior to Admission medications   Medication Sig Start Date End Date Taking? Authorizing Provider  acetaminophen  (TYLENOL ) 650 MG CR tablet Take 1,300 mg by mouth in the morning and at bedtime.   Yes [provider]  ALPRAZolam  (XANAX ) 1 MG tablet Take 1 mg by mouth at bedtime as needed for anxiety or sleep.   Yes [provider]  amitriptyline  (ELAVIL ) 75 MG tablet Take 75 mg by mouth at bedtime. 01/08/24  Yes [provider]  amphetamine -dextroamphetamine  (ADDERALL) 20 MG tablet Take 20 mg by mouth 2 (two) times daily. 10/29/19  Yes [provider]  baclofen  (LIORESAL ) 20 MG tablet Take 20 mg by mouth 2 (two) times daily as needed for muscle spasms.   Yes [provider]  cetirizine (ZYRTEC) 10 MG chewable tablet Chew 10 mg by mouth daily in the afternoon.   Yes [provider]  clonazePAM  (KLONOPIN) 1 MG disintegrating tablet Take 1 mg by mouth daily as needed (for panic attacks).   Yes [provider]  Cyanocobalamin  1000 MCG/ML KIT Inject as directed every 14 (fourteen) days.   Yes [provider]  esomeprazole  (NEXIUM ) 40 MG capsule Take 40 mg by mouth 2 (two) times daily before a meal.   Yes [provider]  famotidine  (PEPCID ) 40 MG tablet Take 40 mg by mouth 2 (two) times daily.   Yes [provider]  folic acid  (FOLVITE ) 1 MG tablet Take 5 mg by mouth daily. 09/13/23 09/12/24 Yes [provider]  gabapentin  (NEURONTIN ) 800 MG tablet Take 1 tablet (800 mg total) by mouth 4 (four) times daily. 10/28/21  Yes Gayland Lauraine PARAS, NP  ibuprofen  (ADVIL ) 800 MG tablet Take 800 mg by mouth in the morning and at bedtime.   Yes [provider]  levofloxacin (LEVAQUIN) 500 MG tablet Take 500 mg by mouth daily.   Yes [provider]  Magnesium  Glycinate 100 MG CAPS Take 300 mg by mouth daily in the afternoon.   Yes [provider]  NIFEdipine (PROCARDIA-XL/NIFEDICAL-XL) 30 MG 24 hr tablet Take 30 mg by mouth daily. 01/01/24 05/05/25 Yes [provider]  ondansetron  (ZOFRAN -ODT) 8 MG disintegrating tablet Take 8 mg by mouth every 8 (eight) hours as needed for nausea or vomiting.   Yes [provider]  oxyCODONE  (ROXICODONE ) 15 MG immediate release tablet Take 15 mg by mouth every 4 (four) hours as needed for moderate  pain (pain score 4-6).   Yes [provider]  potassium chloride  SA (KLOR-CON ) 20 MEQ tablet Take 20 mEq by mouth daily.   Yes [provider]  rifaximin (XIFAXAN) 550 MG TABS tablet Take 550 mg by mouth 3 (three) times daily. 05/13/23 05/09/24 Yes [provider]  Simethicone  (PHAZYME MAXIMUM STRENGTH) 250 MG CAPS Take 250 mg by mouth daily as needed (for bloating).   Yes [provider]  sulfaSALAzine (AZULFIDINE) 500 MG EC tablet Take 500 mg by mouth 2 (two) times  daily. 04/01/24  Yes [provider]  UNABLE TO FIND FERRITIN - infusion given every 14 days   Yes [provider]  Vitamin D -Vitamin K (VITAMIN K2-VITAMIN D3 PO) Take 1 tablet by mouth daily in the afternoon.   Yes [provider]  SKYRIZI 360 MG/2.4ML SOCT Inject 360 mg into the skin every 28 (twenty-eight) days. Patient not taking: Reported on 05/05/2024 03/04/24   [provider]  amLODipine  (NORVASC ) 10 MG tablet Take 1 tablet (10 mg total) by mouth daily. 09/30/11 09/12/19  Norleen Lynwood ORN, MD    Allergies: Penicillins    Review of Systems  Respiratory:  Positive for shortness of breath.     Updated Vital Signs BP 115/67   Pulse (!) 108   Temp 98.1 F (36.7 C) (Oral)   Resp (!) 24   Ht 5' 3 (1.6 m)   Wt 68 kg   LMP 04/12/2024   SpO2 99%   BMI 26.56 kg/m   Physical Exam Constitutional:      Appearance: She is well-developed.  HENT:     Head: Normocephalic and atraumatic.     Mouth/Throat:     Mouth: Mucous membranes are moist.     Pharynx: Oropharynx is clear.  Eyes:     Extraocular Movements: Extraocular movements intact.     Pupils: Pupils are equal, round, and reactive to light.  Cardiovascular:     Rate and Rhythm: Regular rhythm. Tachycardia present.  Pulmonary:     Comments: Coarse, rhonchorous breath sounds diffusely with mild end expiratory wheezing Neurological:     Mental Status: She is alert.     (all labs ordered are listed, but only abnormal results are displayed) Labs Reviewed  COMPREHENSIVE METABOLIC PANEL WITH GFR - Abnormal; Notable for the following components:      Result Value   Potassium 3.3 (*)    CO2 17 (*)    Glucose, Bld 110 (*)    Creatinine, Ser 1.15 (*)    Albumin 2.9 (*)    Alkaline Phosphatase 175 (*)    All other components within normal limits  CBC WITH DIFFERENTIAL/PLATELET - Abnormal; Notable for the following components:   WBC 17.3 (*)    RBC 3.80 (*)    MCV 112.1 (*)    MCH 35.5 (*)     Neutro Abs 15.1 (*)    Abs Immature Granulocytes 0.46 (*)    All other components within normal limits  RESP PANEL BY RT-PCR (RSV, FLU A&B, COVID)  RVPGX2  CULTURE, BLOOD (ROUTINE X 2)  CULTURE, BLOOD (ROUTINE X 2)  PROTIME-INR  HCG, SERUM, QUALITATIVE  PROCALCITONIN  URINALYSIS, W/ REFLEX TO CULTURE (INFECTION SUSPECTED)  I-STAT CG4 LACTIC ACID, ED  I-STAT VENOUS BLOOD GAS, ED  I-STAT CG4 LACTIC ACID, ED    EKG: EKG Interpretation Date/Time:  Sunday May 05 2024 20:46:06 EDT Ventricular Rate:  99 PR Interval:  163 QRS Duration:  89 QT Interval:  348 QTC  Calculation: 447 R Axis:   108  Text Interpretation: Sinus rhythm Lateral infarct, age indeterminate Probable anteroseptal infarct, old Confirmed by Yolande Charleston [phone]) on 05/05/2024 11:30:32 PM  Radiology: CT Angio Chest PE W and/or Wo Contrast Result Date: 05/05/2024 CLINICAL DATA:  Clinical concern for pulmonary embolism. EXAM: CT ANGIOGRAPHY CHEST WITH CONTRAST TECHNIQUE: Multidetector CT imaging of the chest was performed using the standard protocol during bolus administration of intravenous contrast. Multiplanar CT image reconstructions and MIPs were obtained to evaluate the vascular anatomy. RADIATION DOSE REDUCTION: This exam was performed according to the departmental dose-optimization program which includes automated exposure control, adjustment of the mA and/or kV according to patient size and/or use of iterative reconstruction technique. CONTRAST:  75mL OMNIPAQUE  IOHEXOL  350 MG/ML SOLN COMPARISON:  None Available. FINDINGS: Cardiovascular: Satisfactory opacification of the pulmonary arteries to the segmental level. No evidence of pulmonary embolism. Normal heart size. No pericardial effusion. Mediastinum/Nodes: No enlarged mediastinal, hilar, or axillary lymph nodes. Thyroid gland, trachea, and esophagus demonstrate no significant findings. Lungs/Pleura: Marked severity patchy infiltrates are seen throughout both  lungs. No pleural effusion or pneumothorax is identified. Upper Abdomen: No acute abnormality. Musculoskeletal: No chest wall abnormality. No acute or significant osseous findings. Review of the MIP images confirms the above findings. IMPRESSION: 1. No evidence of pulmonary embolism. 2. Marked severity bilateral infiltrates, consistent with multifocal pneumonia. Electronically Signed   By: Suzen Dials M.D.   On: 05/05/2024 22:06   DG Chest Port 1 View if patient is in a treatment room. Result Date: 05/05/2024 CLINICAL DATA:  Short of breath, hypoxia EXAM: PORTABLE CHEST 1 VIEW COMPARISON:  01/09/2020 FINDINGS: Single frontal view of the chest demonstrates an enlarged cardiac silhouette. Diffuse bilateral interstitial and ground-glass opacities consistent with edema or widespread pneumonia. No effusion or pneumothorax. No acute bony abnormalities. IMPRESSION: 1. Diffuse interstitial and ground-glass opacities, which may reflect edema or widespread pneumonia, including atypical pneumonia such as viral etiologies. 2. Enlarged cardiac silhouette. Electronically Signed   By: Ozell Daring M.D.   On: 05/05/2024 19:59     Procedures   Medications Ordered in the ED  lactated ringers  infusion (has no administration in time range)  cefTRIAXone  (ROCEPHIN ) 2 g in sodium chloride  0.9 % 100 mL IVPB (2 g Intravenous New Bag/Given 05/05/24 2312)  azithromycin  (ZITHROMAX ) 500 mg in sodium chloride  0.9 % 250 mL IVPB (has no administration in time range)  ipratropium-albuterol  (DUONEB) 0.5-2.5 (3) MG/3ML nebulizer solution 3 mL (3 mLs Nebulization Given 05/05/24 2101)  oxyCODONE  (Oxy IR/ROXICODONE ) immediate release tablet 15 mg (15 mg Oral Given 05/05/24 2133)  iohexol  (OMNIPAQUE ) 350 MG/ML injection 75 mL (75 mLs Intravenous Contrast Given 05/05/24 2159)  acetaminophen  (TYLENOL ) tablet 1,000 mg (1,000 mg Oral Given 05/05/24 2310)  LORazepam  (ATIVAN ) tablet 1 mg (1 mg Oral Given 05/05/24 2311)    Clinical Course  as of 05/05/24 2336  Sun May 05, 2024  [address] [place] for admission [RP]    Clinical Course User Index [RP] Yolande Charleston BROCKS, MD                                 Medical Decision Making Amount and/or Complexity of Data Reviewed Radiology: ordered.  Risk OTC drugs. Prescription drug management. Decision regarding hospitalization.   Patient presenting to the ED with new hypoxic respiratory failure, saturating 82% on RA with sinus tachycardia 105 bpm and tachypnea 24 breaths/min in the setting of worsening  dyspnea since April.  Of note, patient on IL 23 antagonist in the setting of UC/Crohn's.  No lower extremity edema or JVD.  No travel to high risk areas.  No history of substance use.  Notably, alkaline phosphatase elevated 175.  Creatinine elevated from baseline, however subdiagnostic for AKI.  Neutrophil predominant leukocytosis with WBC 17.3.  On auscultation, coarse rhonchorous breath sounds diffusely with mild end expiratory wheezing.  CT PE study without evidence of acute pulmonary embolus, however evidence of multifocal pneumonia, consistent with CXR noted.  Patient given IV ceftriaxone , azithromycin , and DuoNebs.  Cussed with hospitalist service, patient to be admitted for acute hypoxic respiratory failure in the setting of multifocal pneumonia.  Final diagnoses:  Acute hypoxic respiratory failure [facility])  Multifocal pneumonia    ED Discharge Orders     None          Dorcus Fallow, MD 05/05/24 2336    Yolande Lamar BROCKS, MD 05/11/24 2123

## 2024-05-05 NOTE — ED Triage Notes (Signed)
 The pt has been sob since April  but for the past 4 days she has been extremely sob.  Very pale  sats low both hands are cold lmp 0905

## 2024-05-06 ENCOUNTER — Encounter (HOSPITAL_COMMUNITY): Payer: Self-pay | Admitting: Internal Medicine

## 2024-05-06 DIAGNOSIS — J441 Chronic obstructive pulmonary disease with (acute) exacerbation: Secondary | ICD-10-CM

## 2024-05-06 DIAGNOSIS — J189 Pneumonia, unspecified organism: Secondary | ICD-10-CM

## 2024-05-06 DIAGNOSIS — R079 Chest pain, unspecified: Secondary | ICD-10-CM

## 2024-05-06 LAB — I-STAT VENOUS BLOOD GAS, ED
Acid-base deficit: 7 mmol/L — ABNORMAL HIGH (ref 0.0–2.0)
Bicarbonate: 18.6 mmol/L — ABNORMAL LOW (ref 20.0–28.0)
Calcium, Ion: 1.14 mmol/L — ABNORMAL LOW (ref 1.15–1.40)
HCT: 34 % — ABNORMAL LOW (ref 36.0–46.0)
Hemoglobin: 11.6 g/dL — ABNORMAL LOW (ref 12.0–15.0)
O2 Saturation: 99 %
Potassium: 3.3 mmol/L — ABNORMAL LOW (ref 3.5–5.1)
Sodium: 135 mmol/L (ref 135–145)
TCO2: 20 mmol/L — ABNORMAL LOW (ref 22–32)
pCO2, Ven: 35 mmHg — ABNORMAL LOW (ref 44–60)
pH, Ven: 7.333 (ref 7.25–7.43)
pO2, Ven: 137 mmHg — ABNORMAL HIGH (ref 32–45)

## 2024-05-06 LAB — TROPONIN I (HIGH SENSITIVITY): Troponin I (High Sensitivity): 6 ng/L (ref <18)

## 2024-05-06 LAB — STREP PNEUMONIAE URINARY ANTIGEN: Strep Pneumo Urinary Antigen: NEGATIVE

## 2024-05-06 MED ORDER — GUAIFENESIN ER 600 MG PO TB12
600.0000 mg | ORAL_TABLET | Freq: Two times a day (BID) | ORAL | Status: DC
  Administered 2024-05-06 – 2024-05-09 (×7): 600 mg via ORAL
  Filled 2024-05-06 (×7): qty 1

## 2024-05-06 MED ORDER — SODIUM CHLORIDE 0.9 % IV SOLN
500.0000 mg | INTRAVENOUS | Status: DC
  Administered 2024-05-07: 500 mg via INTRAVENOUS
  Filled 2024-05-06: qty 5

## 2024-05-06 MED ORDER — MORPHINE SULFATE (PF) 2 MG/ML IV SOLN
1.0000 mg | INTRAVENOUS | Status: DC | PRN
Start: 2024-05-06 — End: 2024-05-08
  Administered 2024-05-06 – 2024-05-08 (×6): 1 mg via INTRAVENOUS
  Filled 2024-05-06 (×6): qty 1

## 2024-05-06 MED ORDER — SODIUM CHLORIDE 0.9 % IV SOLN
2.0000 g | INTRAVENOUS | Status: DC
  Administered 2024-05-06 – 2024-05-09 (×3): 2 g via INTRAVENOUS
  Filled 2024-05-06 (×3): qty 20

## 2024-05-06 MED ORDER — FAMOTIDINE 20 MG PO TABS
40.0000 mg | ORAL_TABLET | Freq: Two times a day (BID) | ORAL | Status: DC
  Administered 2024-05-06 – 2024-05-09 (×6): 40 mg via ORAL
  Filled 2024-05-06 (×6): qty 2

## 2024-05-06 MED ORDER — IPRATROPIUM-ALBUTEROL 0.5-2.5 (3) MG/3ML IN SOLN
3.0000 mL | Freq: Four times a day (QID) | RESPIRATORY_TRACT | Status: DC
  Administered 2024-05-06 – 2024-05-08 (×7): 3 mL via RESPIRATORY_TRACT
  Filled 2024-05-06 (×7): qty 3
  Filled 2024-05-06: qty 42

## 2024-05-06 MED ORDER — NIFEDIPINE ER OSMOTIC RELEASE 30 MG PO TB24
30.0000 mg | ORAL_TABLET | Freq: Every day | ORAL | Status: DC
  Administered 2024-05-06 – 2024-05-09 (×4): 30 mg via ORAL
  Filled 2024-05-06 (×5): qty 1

## 2024-05-06 MED ORDER — METHYLPREDNISOLONE SODIUM SUCC 125 MG IJ SOLR
120.0000 mg | INTRAMUSCULAR | Status: DC
  Administered 2024-05-06 – 2024-05-09 (×4): 120 mg via INTRAVENOUS
  Filled 2024-05-06 (×4): qty 2

## 2024-05-06 MED ORDER — GABAPENTIN 400 MG PO CAPS
800.0000 mg | ORAL_CAPSULE | Freq: Four times a day (QID) | ORAL | Status: DC
  Administered 2024-05-06 – 2024-05-09 (×13): 800 mg via ORAL
  Filled 2024-05-06 (×7): qty 2
  Filled 2024-05-06: qty 8
  Filled 2024-05-06 (×5): qty 2

## 2024-05-06 MED ORDER — AMITRIPTYLINE HCL 25 MG PO TABS
75.0000 mg | ORAL_TABLET | Freq: Every day | ORAL | Status: DC
Start: 2024-05-06 — End: 2024-05-09
  Administered 2024-05-06 – 2024-05-08 (×3): 75 mg via ORAL
  Filled 2024-05-06 (×5): qty 3
  Filled 2024-05-06: qty 1
  Filled 2024-05-06: qty 3

## 2024-05-06 MED ORDER — LACTATED RINGERS IV SOLN
INTRAVENOUS | Status: AC
Start: 2024-05-06 — End: 2024-05-06
  Administered 2024-05-06: 999 mL via INTRAVENOUS

## 2024-05-06 MED ORDER — CLONAZEPAM 0.5 MG PO TBDP
1.0000 mg | ORAL_TABLET | Freq: Every day | ORAL | Status: DC | PRN
  Administered 2024-05-06 – 2024-05-08 (×3): 1 mg via ORAL
  Filled 2024-05-06 (×4): qty 2

## 2024-05-06 MED ORDER — HYDROMORPHONE HCL 1 MG/ML IJ SOLN
0.5000 mg | Freq: Once | INTRAMUSCULAR | Status: AC
  Administered 2024-05-06: 0.5 mg via INTRAVENOUS
  Filled 2024-05-06: qty 0.5

## 2024-05-06 MED ORDER — ALPRAZOLAM 0.5 MG PO TABS
1.0000 mg | ORAL_TABLET | Freq: Every evening | ORAL | Status: DC | PRN
  Administered 2024-05-06 – 2024-05-09 (×3): 1 mg via ORAL
  Filled 2024-05-06 (×3): qty 2

## 2024-05-06 MED ORDER — SULFASALAZINE 500 MG PO TBEC
500.0000 mg | DELAYED_RELEASE_TABLET | Freq: Two times a day (BID) | ORAL | Status: DC
Start: 2024-05-06 — End: 2024-05-07
  Administered 2024-05-06 – 2024-05-07 (×2): 500 mg via ORAL
  Filled 2024-05-06 (×5): qty 1

## 2024-05-06 MED ORDER — ACETAMINOPHEN 650 MG RE SUPP: 650.0000 mg | Freq: Four times a day (QID) | RECTAL | Status: DC | PRN

## 2024-05-06 MED ORDER — ENOXAPARIN SODIUM 40 MG/0.4ML IJ SOSY
40.0000 mg | PREFILLED_SYRINGE | INTRAMUSCULAR | Status: DC
Start: 2024-05-06 — End: 2024-05-09
  Administered 2024-05-06 – 2024-05-08 (×3): 40 mg via SUBCUTANEOUS
  Filled 2024-05-06 (×3): qty 0.4

## 2024-05-06 MED ORDER — ACETAMINOPHEN 325 MG PO TABS
650.0000 mg | ORAL_TABLET | Freq: Four times a day (QID) | ORAL | Status: DC | PRN
  Administered 2024-05-06 (×2): 650 mg via ORAL
  Filled 2024-05-06 (×2): qty 2

## 2024-05-06 MED ORDER — BACLOFEN 10 MG PO TABS
20.0000 mg | ORAL_TABLET | Freq: Two times a day (BID) | ORAL | Status: DC | PRN
  Administered 2024-05-06 – 2024-05-09 (×3): 20 mg via ORAL
  Filled 2024-05-06 (×3): qty 2

## 2024-05-06 MED ORDER — AMPHETAMINE-DEXTROAMPHETAMINE 10 MG PO TABS
20.0000 mg | ORAL_TABLET | Freq: Two times a day (BID) | ORAL | Status: DC
Start: 2024-05-06 — End: 2024-05-07
  Administered 2024-05-06 – 2024-05-07 (×2): 20 mg via ORAL
  Filled 2024-05-06 (×3): qty 2

## 2024-05-06 MED ORDER — ONDANSETRON HCL 4 MG/2ML IJ SOLN: 4.0000 mg | Freq: Once | INTRAMUSCULAR | Status: DC | PRN

## 2024-05-06 MED ORDER — ALBUTEROL SULFATE (2.5 MG/3ML) 0.083% IN NEBU: 2.5000 mg | INHALATION_SOLUTION | RESPIRATORY_TRACT | Status: DC | PRN

## 2024-05-06 MED ORDER — BUDESONIDE 0.25 MG/2ML IN SUSP
0.2500 mg | Freq: Two times a day (BID) | RESPIRATORY_TRACT | Status: DC
  Administered 2024-05-06 – 2024-05-07 (×3): 0.25 mg via RESPIRATORY_TRACT
  Filled 2024-05-06 (×6): qty 2

## 2024-05-06 MED ORDER — PANTOPRAZOLE SODIUM 40 MG PO TBEC
40.0000 mg | DELAYED_RELEASE_TABLET | Freq: Two times a day (BID) | ORAL | Status: DC
Start: 2024-05-06 — End: 2024-05-09
  Administered 2024-05-06 – 2024-05-09 (×6): 40 mg via ORAL
  Filled 2024-05-06 (×6): qty 1

## 2024-05-06 MED ORDER — RIFAXIMIN 550 MG PO TABS
550.0000 mg | ORAL_TABLET | Freq: Three times a day (TID) | ORAL | Status: DC
  Administered 2024-05-06 – 2024-05-09 (×9): 550 mg via ORAL
  Filled 2024-05-06 (×9): qty 1

## 2024-05-06 MED ORDER — OXYCODONE HCL 5 MG PO TABS
15.0000 mg | ORAL_TABLET | ORAL | Status: DC | PRN
  Administered 2024-05-06 – 2024-05-08 (×11): 15 mg via ORAL
  Filled 2024-05-06 (×11): qty 3

## 2024-05-06 MED ORDER — POTASSIUM CHLORIDE CRYS ER 20 MEQ PO TBCR
40.0000 meq | EXTENDED_RELEASE_TABLET | Freq: Once | ORAL | Status: AC
  Administered 2024-05-06: 40 meq via ORAL
  Filled 2024-05-06: qty 2

## 2024-05-06 NOTE — Progress Notes (Signed)
 Patient refused BiPAP at this time, stated they needed more anxiety medication. RN notified. Patient switched from NRB to 12L HFNC at this time.

## 2024-05-06 NOTE — ED Notes (Signed)
 Phleb unable to obtain labs X2

## 2024-05-06 NOTE — ED Notes (Signed)
Pt and all belongings transported upstairs.  

## 2024-05-06 NOTE — ED Notes (Signed)
 Pt o2 sats 75%, Emergency MD contacted to see pt. Pt placed on non-rebreather, o2 returning to 100%.
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
## 2024-05-06 NOTE — ED Notes (Signed)
 RN attempted to get blood work, Phlebotomy called at this time. Unsuccessful attempts x2

## 2024-05-06 NOTE — ED Notes (Signed)
Report received, assumed care of patient at this time.  

## 2024-05-06 NOTE — ED Provider Notes (Signed)
 Patient waiting for hospital admission, developed significant oxygen desaturation despite supplemental oxygen.  On exam, she still has moderate wheezing and has some increased work of breathing.  She does not appear to be tiring, I feel that she would be appropriate for BiPAP.  BiPAP is ordered.  CRITICAL CARE Performed by: Alm Lias Total critical care time: 35 minutes Critical care time was exclusive of separately billable procedures and treating other patients. Critical care was necessary to treat or prevent imminent or life-threatening deterioration. Critical care was time spent personally by me on the following activities: development of treatment plan with patient and/or surrogate as well as nursing, discussions with consultants, evaluation of patient's response to treatment, examination of patient, obtaining history from patient or surrogate, ordering and performing treatments and interventions, ordering and review of laboratory studies, ordering and review of radiographic studies, pulse oximetry and re-evaluation of patient's condition.    Lias Alm, MD 05/06/24 [phone]

## 2024-05-06 NOTE — Progress Notes (Signed)
 [facility]   ROUNDING   NOTE NJERI VICENTE FMW:[id number]  DOB: Dec 17, 1980  DOA: 05/05/2024  PCP: System, Provider Not In  05/06/2024,7:03 AM  LOS: 1 day    Code Status: Full code     from: Home   43 year old white female known severe Crohn's disease on Skyrizi- Ileocecal resection 2015 ileocolonic anastomosis stricture operated on by Dr. 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 with seizure x 1  she is scheduled to follow-up with rheumatology office early next month but gets the majority of her care at [facility] for [facility]istry as well and gets managed of cervical facet syndrome   9/28 presented with 4 days extremely SOB dry cough chronic nausea chest tightness In the ED decompensated with O2 sats instead of menses initially recommended BiPAP but patient was too anxious and placed on high flow nasal cannula  Labs on admit Sodium 135 potassium 3.3 BUN/creatinine 15/1.1 alk phos 175 LFTs normal Lactic acid 1.1 procalcitonin 0.6 WBC 17 platelet 235 hemoglobin 13 Respiratory viral panel negative strep pneumo negative blood cultures obtained  Imaging CXR 1 view diffuse interstitial ground glass opacities?  Edema widespread pneumonia CTA chest no pulmonary embolism markedly severe bilateral infiltrates with multifocal pneumonia   Assessment  & Plan :    Multifocal pneumonia in immunocompromised host Probable failed outpatient treatment was given Levaquin 10-day course on 925 at [facility]  Early sepsis on admission Continue azithromycin  ceftriaxone  IV, saline 125 cc/H Sepsis physiology resolving slowly with IV fluid repletion- Follow-up blood culture/sputum cultures to completion and narrow antibiotics when able Acute hypoxic respiratory failure on arrival  secondary to above Still smoker possibly be COPD related Antibiotics as above Continue Solu-Medrol  120 every 24 today but switch to prednisone  as no real evidence IV superior to oral Repeat imaging as above Try to de-escalate off oxygen if needed get blood gas Right now she is able to mentate and saturate fairly well on 5 L high flow so I will wean her to 2 L nasal oxygen and treat her comorbid anxiety Given her prior history of ARDS in 2019 would watch her closely however Severe Crohn's disease with anastomoses and leaks as above Maintained at home on Xifaxan 550?? Holding Antimony as unclear when was last able to take-MAR documents due to start 9/22 but unclear Continue for now sulfasalazine 500 twice daily Chronic pain habituation/dependence History of cervicalgia Continue baclofen  20 twice daily when able Watch mentation and consider resumption Klonopin 1 daily/Ativan  She follows Dr. Pleasant since 2019 Previous fibromyalgia polysubstance abuse She is also on comorbid Xanax  1 at bedtime sleep Elavil  75 at bedtime and?  Adderall 20 twice daily in addition to oxycodone  15 every 4 as needed Holding Adderall for right now Previous right femoral fracture Prior subarachnoid hemorrhage not on antiepileptics    Data Reviewed:   Labs pending  DVT prophylaxis: Lovenox   Status is: Inpatient Remains inpatient appropriate because:   Needs several days to get better     Current Dispo: Likely home     Subjective:    Feels about the same Tells me her GI doctor prescribed Levaquin and she has been taking it She has had URI symptoms since April which have not improved no real ill contacts She gets her pain meds as above No chest pain currently   Objective +  exam Vitals:   05/06/24 0530 05/06/24 0600 05/06/24 0630 05/06/24 0700  BP: 119/82 114/80 104/88 118/83  Pulse: 94 90 91 88  Resp: (!) 29 (!) 27 (!) 29 (!) 27  Temp:      TempSrc:      SpO2: 91% 90% 90% 92%  Weight:       Height:       Filed Weights   05/05/24 1917  Weight: 68 kg     Examination: Looks about stated age no distress EOMI NCAT no focal deficit CTAB no added sound no wheeze although lower lung fields posteriorly are diminished S1-S2 no murmur ROM is intact moving 4 limbs equally Power is 5/5 Abdomen soft no rebound postop changes no edema Tattoos on legs No icterus no pallor     Scheduled Meds:  amitriptyline   75 mg Oral QHS   amphetamine -dextroamphetamine   20 mg Oral BID   budesonide  (PULMICORT ) nebulizer solution  0.25 mg Nebulization BID   enoxaparin  (LOVENOX ) injection  40 mg Subcutaneous Q24H   famotidine   40 mg Oral BID   gabapentin   800 mg Oral QID   guaiFENesin   600 mg Oral BID   ipratropium-albuterol   3 mL Nebulization Q6H   methylPREDNISolone  (SOLU-MEDROL ) injection  120 mg Intravenous Q24H   NIFEdipine  30 mg Oral Daily   pantoprazole   40 mg Oral BID   rifaximin  550 mg Oral TID   Continuous Infusions:  azithromycin      cefTRIAXone  (ROCEPHIN )  IV     lactated ringers  125 mL/hr at 05/06/24 0435    Time 60  Colen Grimes, MD  [facility]

## 2024-05-06 NOTE — ED Notes (Signed)
 [place] sandwich and cola provided per pt request

## 2024-05-06 NOTE — ED Notes (Signed)
 RT at bedside.

## 2024-05-07 DIAGNOSIS — A419 Sepsis, unspecified organism: Secondary | ICD-10-CM | POA: Diagnosis not present

## 2024-05-07 DIAGNOSIS — J441 Chronic obstructive pulmonary disease with (acute) exacerbation: Secondary | ICD-10-CM

## 2024-05-07 DIAGNOSIS — R6520 Severe sepsis without septic shock: Secondary | ICD-10-CM

## 2024-05-07 DIAGNOSIS — J9601 Acute respiratory failure with hypoxia: Secondary | ICD-10-CM | POA: Diagnosis not present

## 2024-05-07 DIAGNOSIS — I10 Essential (primary) hypertension: Secondary | ICD-10-CM

## 2024-05-07 DIAGNOSIS — J189 Pneumonia, unspecified organism: Secondary | ICD-10-CM | POA: Diagnosis not present

## 2024-05-07 LAB — CBC WITH DIFFERENTIAL/PLATELET
Abs Immature Granulocytes: 0.36 K/uL — ABNORMAL HIGH (ref 0.00–0.07)
Basophils Absolute: 0.1 K/uL (ref 0.0–0.1)
Basophils Relative: 1 %
Eosinophils Absolute: 0 K/uL (ref 0.0–0.5)
Eosinophils Relative: 0 %
HCT: 37.5 % (ref 36.0–46.0)
Hemoglobin: 12.4 g/dL (ref 12.0–15.0)
Immature Granulocytes: 3 %
Lymphocytes Relative: 7 %
Lymphs Abs: 0.7 K/uL (ref 0.7–4.0)
MCH: 35.1 pg — ABNORMAL HIGH (ref 26.0–34.0)
MCHC: 33.1 g/dL (ref 30.0–36.0)
MCV: 106.2 fL — ABNORMAL HIGH (ref 80.0–100.0)
Monocytes Absolute: 0.2 K/uL (ref 0.1–1.0)
Monocytes Relative: 1 %
Neutro Abs: 9.3 K/uL — ABNORMAL HIGH (ref 1.7–7.7)
Neutrophils Relative %: 88 %
Platelets: 303 K/uL (ref 150–400)
RBC: 3.53 MIL/uL — ABNORMAL LOW (ref 3.87–5.11)
RDW: 14.5 % (ref 11.5–15.5)
WBC: 10.5 K/uL (ref 4.0–10.5)
nRBC: 0 % (ref 0.0–0.2)

## 2024-05-07 LAB — PROCALCITONIN: Procalcitonin: 0.38 ng/mL

## 2024-05-07 LAB — RESPIRATORY PANEL BY PCR

## 2024-05-07 LAB — COMPREHENSIVE METABOLIC PANEL WITH GFR
ALT: 18 U/L (ref 0–44)
AST: 14 U/L — ABNORMAL LOW (ref 15–41)
Albumin: 2.6 g/dL — ABNORMAL LOW (ref 3.5–5.0)
Alkaline Phosphatase: 134 U/L — ABNORMAL HIGH (ref 38–126)
Anion gap: 13 (ref 5–15)
BUN: 17 mg/dL (ref 6–20)
CO2: 19 mmol/L — ABNORMAL LOW (ref 22–32)
Calcium: 8.8 mg/dL — ABNORMAL LOW (ref 8.9–10.3)
Chloride: 106 mmol/L (ref 98–111)
Creatinine, Ser: 0.86 mg/dL (ref 0.44–1.00)
GFR, Estimated: >60 mL/min (ref >60)
Glucose, Bld: 122 mg/dL — ABNORMAL HIGH (ref 70–99)
Potassium: 4.1 mmol/L (ref 3.5–5.1)
Sodium: 138 mmol/L (ref 135–145)
Total Bilirubin: 0.4 mg/dL (ref 0.0–1.2)
Total Protein: 6.3 g/dL — ABNORMAL LOW (ref 6.5–8.1)

## 2024-05-07 LAB — C-REACTIVE PROTEIN: CRP: 25.4 mg/dL — ABNORMAL HIGH (ref <1.0)

## 2024-05-07 LAB — LACTATE DEHYDROGENASE: LDH: 396 U/L — ABNORMAL HIGH (ref 98–192)

## 2024-05-07 LAB — BRAIN NATRIURETIC PEPTIDE: B Natriuretic Peptide: 115.3 pg/mL — ABNORMAL HIGH (ref 0.0–100.0)

## 2024-05-07 LAB — SEDIMENTATION RATE: Sed Rate: 62 mm/h — ABNORMAL HIGH (ref 0–22)

## 2024-05-07 MED ORDER — HYDROMORPHONE HCL 1 MG/ML IJ SOLN: 0.5000 mg | Freq: Once | INTRAMUSCULAR | Status: DC

## 2024-05-07 MED ORDER — FOLIC ACID 1 MG PO TABS
5.0000 mg | ORAL_TABLET | Freq: Every day | ORAL | Status: DC
  Administered 2024-05-07 – 2024-05-09 (×3): 5 mg via ORAL
  Filled 2024-05-07 (×3): qty 5

## 2024-05-07 MED ORDER — MAGNESIUM GLYCINATE 100 MG PO CAPS: 300.0000 mg | ORAL_CAPSULE | Freq: Every day | ORAL | Status: DC

## 2024-05-07 MED ORDER — AZITHROMYCIN 500 MG PO TABS
500.0000 mg | ORAL_TABLET | Freq: Every day | ORAL | Status: DC
  Administered 2024-05-07 – 2024-05-09 (×3): 500 mg via ORAL
  Filled 2024-05-07 (×3): qty 1

## 2024-05-07 MED ORDER — ADULT MULTIVITAMIN W/MINERALS CH
1.0000 | ORAL_TABLET | Freq: Every day | ORAL | Status: DC
  Administered 2024-05-07 – 2024-05-09 (×3): 1 via ORAL
  Filled 2024-05-07 (×3): qty 1

## 2024-05-07 MED ORDER — LORATADINE 10 MG PO TABS
10.0000 mg | ORAL_TABLET | Freq: Every day | ORAL | Status: DC
Start: 2024-05-07 — End: 2024-05-09
  Administered 2024-05-07 – 2024-05-09 (×3): 10 mg via ORAL
  Filled 2024-05-07 (×3): qty 1

## 2024-05-07 MED ORDER — SULFASALAZINE 500 MG PO TBEC
1000.0000 mg | DELAYED_RELEASE_TABLET | Freq: Every day | ORAL | Status: DC
  Administered 2024-05-08: 1000 mg via ORAL
  Filled 2024-05-07 (×3): qty 2

## 2024-05-07 MED ORDER — HYDROMORPHONE HCL 1 MG/ML IJ SOLN
1.0000 mg | Freq: Once | INTRAMUSCULAR | Status: AC
  Administered 2024-05-07: 1 mg via INTRAVENOUS
  Filled 2024-05-07: qty 1

## 2024-05-07 MED ORDER — AMPHETAMINE-DEXTROAMPHETAMINE 10 MG PO TABS
20.0000 mg | ORAL_TABLET | Freq: Two times a day (BID) | ORAL | Status: DC
Start: 2024-05-07 — End: 2024-05-09
  Administered 2024-05-07 – 2024-05-09 (×5): 20 mg via ORAL
  Filled 2024-05-07 (×6): qty 2

## 2024-05-07 MED ORDER — SULFAMETHOXAZOLE-TRIMETHOPRIM 400-80 MG/5ML IV SOLN: 15.0000 mg/kg/d | Freq: Three times a day (TID) | INTRAVENOUS | Status: DC

## 2024-05-07 MED ORDER — SULFAMETHOXAZOLE-TRIMETHOPRIM 400-80 MG/5ML IV SOLN
15.0000 mg/kg/d | Freq: Three times a day (TID) | INTRAVENOUS | Status: DC
  Administered 2024-05-07 – 2024-05-08 (×2): 340 mg via INTRAVENOUS
  Filled 2024-05-07 (×4): qty 21.25

## 2024-05-07 MED ORDER — HYDROMORPHONE HCL 1 MG/ML IJ SOLN
0.5000 mg | INTRAMUSCULAR | Status: DC | PRN
  Administered 2024-05-07 – 2024-05-08 (×4): 0.5 mg via INTRAVENOUS
  Filled 2024-05-07 (×4): qty 0.5

## 2024-05-07 MED ORDER — POTASSIUM CHLORIDE CRYS ER 20 MEQ PO TBCR
20.0000 meq | EXTENDED_RELEASE_TABLET | Freq: Every day | ORAL | Status: DC
  Administered 2024-05-07 – 2024-05-09 (×3): 20 meq via ORAL
  Filled 2024-05-07 (×3): qty 1

## 2024-05-07 MED ORDER — VITAMIN K2-VITAMIN D3 90-125 MCG PO CAPS: ORAL_CAPSULE | Freq: Every day | ORAL | Status: DC

## 2024-05-07 NOTE — Progress Notes (Addendum)
 PROGRESS NOTE        PATIENT DETAILS Name: [name] Age: 43 y.o. Sex: female Date of Birth: October 09, 1980 Admit Date: 05/05/2024 Admitting Physician Editha Ram, MD ERE:[email or web address], Provider Not In  Brief Summary: Patient is a 43 y.o.  female with history of Crohn's disease-recently completed induction of Skyrizi (previously on infliximab for years), ongoing tobacco use-who has had a URI (productive cough) since April of this year-completed several rounds of antibiotics (Zithromax /doxycycline/most recently Levaquin) presented with approximately 5-7-day history of worsening shortness of breath/chest pressure-patient was found to have acute hypoxic respiratory failure-in extensive bilateral pulmonary infiltrates.  Significant events: 9/29>> admit to [facility]-hypoxia-bilateral lung infiltrates.  Significant studies: 9/28>> CTA chest: No PE, patchy bilateral infiltrates.  Significant microbiology data: 9/28>> COVID PCR/influenza PCR/RSV PCR: Negative 9/28>> blood cultures: No growth 9/30>> respiratory virus panel: Pending  Procedures: None  Consults: PCCM  Subjective: Continues to cough-yellow sputum-on around 4-5 L of HFNC.  Objective: Vitals: Blood pressure 123/83, pulse 68, temperature 97.8 F (36.6 C), temperature source Oral, resp. rate 18, height 5' 3 (1.6 m), weight 68 kg, last menstrual period 04/12/2024, SpO2 95%.   Exam: Gen Exam:Alert awake-not in any distress HEENT:atraumatic, normocephalic Chest: Few bibasilar rales but otherwise clear to auscultation. CVS:S1S2 regular Abdomen:soft non tender, non distended Extremities:no edema Neurology: Non focal Skin: no rash  Pertinent Labs/Radiology:    Latest Ref Rng & Units 05/06/2024   12:41 AM 05/05/2024    7:30 PM 07/25/2019    6:30 AM  CBC  WBC 4.0 - 10.5 K/uL  17.3  10.6   Hemoglobin 12.0 - 15.0 g/dL 88.3  86.4  84.7   Hematocrit 36.0 - 46.0 % 34.0  42.6  49.3   Platelets 150 -  400 K/uL  235  437     Lab Results  Component Value Date   NA 135 05/06/2024   K 3.3 (L) 05/06/2024   CL 104 05/05/2024   CO2 17 (L) 05/05/2024      Assessment/Plan: Acute hypoxic respiratory failure  Somewhat improved-around 4-6 L of HFNC Has extensive bilateral infiltrates on CT imaging-unclear etiology-see below In interim-continue steroid/empiric antibiotics-volume status is stable-do not think she requires diuretics at this point.  Extensive bilateral lung infiltrates Has URI symptoms since April 2025-has completed several rounds of outpatient antibiotics (Zithromax  x 2, Doxy, most recently Levaquin).  Outpatient chest x-ray in Care everywhere x 2 were negative-these were done several weeks ago. She is on biologic agents for Crohn's disease for the past few years (recently switched from infliximab to [facility] in July 2025). Differentials are broad-atypical infections, hypersensitive pneumonitis, ILD/inflammation are all possible.  (Per review of care everywhere-ANA neg February 2025, anti-MPO/anti-PR3 antibody - March 2025) Check CRP-respiratory virus panel-BNP For now continue with Solu-Medrol /empiric antibiotics Will get PCCM opinion.  Addendum: D/w PCCM MD-Dr Pawar recommendations are to start Bactrim , get ID consult-re concern for PJP  History of stricturing Crohn's disease-s/p 2 small bowel resections Longstanding history of Crohn's disease-follows with GI at [facility] Previously on infliximab-switch to [employer] this past July-completed induction and was scheduled to start maintenance Skyrizi last week. Also on sulfasalazine (Per patient-she has previously been on adalimumab 2012- then ustekinumab 2019, and then infliximab plus azathioprine from 20 22-20 25-and then recently switched to [employer])  RLE ulcer S/p biopsy at [facility]-apparently consistent with resolving hematoma Supportive care at this point.  ?  COPD No formal diagnosis-given her longstanding history of tobacco  use-she was recently started on a rescue inhaler. For now continue with bronchodilators.  HTN Nifedipine  Fibromyalgia/cervicalgia Neurontin /as needed oxycodone /as needed baclofen   Mood disorder Seems stable Continue as needed Klonopin/Elavil /Adderall  GERD PPI  Code status:   Code Status: Full Code   DVT Prophylaxis:enoxaparin  (LOVENOX ) injection 40 mg Start: 05/06/24 1000    Family Communication: None at bedside   Disposition Plan: Status is: Inpatient Remains inpatient appropriate because: Severity of illness   Planned Discharge Destination:Home   Diet: Diet Order             Diet Heart Room service appropriate? Yes; Fluid consistency: Thin  Diet effective now                     Antimicrobial agents: Anti-infectives (From admission, onward)    Start     Dose/Rate Route Frequency Ordered Stop   05/06/24 2300  azithromycin  (ZITHROMAX ) 500 mg in sodium chloride  0.9 % 250 mL IVPB        500 mg 250 mL/hr over 60 Minutes Intravenous Every 24 hours 05/06/24 0212     05/06/24 2300  cefTRIAXone  (ROCEPHIN ) 2 g in sodium chloride  0.9 % 100 mL IVPB        2 g 200 mL/hr over 30 Minutes Intravenous Every 24 hours 05/06/24 0212     05/06/24 1000  rifaximin (XIFAXAN) tablet 550 mg        550 mg Oral 3 times daily 05/06/24 0212     05/05/24 2315  cefTRIAXone  (ROCEPHIN ) 2 g in sodium chloride  0.9 % 100 mL IVPB        2 g 200 mL/hr over 30 Minutes Intravenous Once 05/05/24 2302 05/06/24 0004   05/05/24 2315  azithromycin  (ZITHROMAX ) 500 mg in sodium chloride  0.9 % 250 mL IVPB        500 mg 250 mL/hr over 60 Minutes Intravenous  Once 05/05/24 2302 05/06/24 0118        MEDICATIONS: Scheduled Meds:  amitriptyline   75 mg Oral QHS   amphetamine -dextroamphetamine   20 mg Oral BID   budesonide  (PULMICORT ) nebulizer solution  0.25 mg Nebulization BID   enoxaparin  (LOVENOX ) injection  40 mg Subcutaneous Q24H   famotidine   40 mg Oral BID   gabapentin   800 mg Oral QID    guaiFENesin   600 mg Oral BID   ipratropium-albuterol   3 mL Nebulization Q6H   methylPREDNISolone  (SOLU-MEDROL ) injection  120 mg Intravenous Q24H   NIFEdipine  30 mg Oral Daily   pantoprazole   40 mg Oral BID   rifaximin  550 mg Oral TID   sulfaSALAzine  500 mg Oral BID   Continuous Infusions:  azithromycin  500 mg (05/07/24 0027)   cefTRIAXone  (ROCEPHIN )  IV 2 g (05/06/24 2331)   PRN Meds:.acetaminophen  **OR** acetaminophen , albuterol , ALPRAZolam , baclofen , clonazePAM, morphine  injection, ondansetron  (ZOFRAN ) IV, oxyCODONE    I have personally reviewed following labs and imaging studies  LABORATORY DATA: CBC: Recent Labs  Lab 05/05/24 1930 05/06/24 0041  WBC 17.3*  --   NEUTROABS 15.1*  --   HGB 13.5 11.6*  HCT 42.6 34.0*  MCV 112.1*  --   PLT 235  --     Basic Metabolic Panel: Recent Labs  Lab 05/05/24 1930 05/06/24 0041  NA 135 135  K 3.3* 3.3*  CL 104  --   CO2 17*  --   GLUCOSE 110*  --   BUN 15  --  CREATININE 1.15*  --   CALCIUM  9.1  --     GFR: Estimated Creatinine Clearance: 59 mL/min (A) (by C-G formula based on SCr of 1.15 mg/dL (H)).  Liver Function Tests: Recent Labs  Lab 05/05/24 1930  AST 39  ALT 22  ALKPHOS 175*  BILITOT 0.3  PROT 7.1  ALBUMIN 2.9*   No results for input(s): LIPASE, AMYLASE in the last 168 hours. No results for input(s): AMMONIA in the last 168 hours.  Coagulation Profile: Recent Labs  Lab 05/05/24 1930  INR 1.0    Cardiac Enzymes: No results for input(s): CKTOTAL, CKMB, CKMBINDEX, TROPONINI in the last 168 hours.  BNP (last 3 results) No results for input(s): PROBNP in the last 8760 hours.  Lipid Profile: No results for input(s): CHOL, HDL, LDLCALC, TRIG, CHOLHDL, LDLDIRECT in the last 72 hours.  Thyroid Function Tests: No results for input(s): TSH, T4TOTAL, FREET4, T3FREE, THYROIDAB in the last 72 hours.  Anemia Panel: No results for input(s): VITAMINB12,  FOLATE, FERRITIN, TIBC, IRON, RETICCTPCT in the last 72 hours.  Urine analysis:    Component Value Date/Time   COLORURINE YELLOW 04/30/2019 0950   APPEARANCEUR CLEAR 04/30/2019 0950   LABSPEC 1.012 04/30/2019 0950   PHURINE 7.0 04/30/2019 0950   GLUCOSEU NEGATIVE 04/30/2019 0950   HGBUR NEGATIVE 04/30/2019 0950   BILIRUBINUR NEGATIVE 04/30/2019 0950   KETONESUR NEGATIVE 04/30/2019 0950   PROTEINUR 30 (A) 04/30/2019 0950   UROBILINOGEN 0.2 10/22/2010 0038   NITRITE NEGATIVE 04/30/2019 0950   LEUKOCYTESUR NEGATIVE 04/30/2019 0950    Sepsis Labs: Lactic Acid, Venous    Component Value Date/Time   LATICACIDVEN 1.1 05/05/2024 1949    MICROBIOLOGY: Recent Results (from the past 240 hours)  Culture, blood (Routine x 2)     Status: None (Preliminary result)   Collection Time: 05/05/24  7:30 PM   Specimen: BLOOD LEFT ARM  Result Value Ref Range Status   Specimen Description BLOOD LEFT ARM  Final   Special Requests   Final    BOTTLES DRAWN AEROBIC AND ANAEROBIC Blood Culture results may not be optimal due to an inadequate volume of blood received in culture bottles   Culture   Final    NO GROWTH < 12 HOURS Performed at [facility], [address] [place], [place] [address]    Report Status PENDING  Incomplete  Culture, blood (Routine x 2)     Status: None (Preliminary result)   Collection Time: 05/05/24  7:35 PM   Specimen: BLOOD LEFT FOREARM  Result Value Ref Range Status   Specimen Description BLOOD LEFT FOREARM  Final   Special Requests   Final    BOTTLES DRAWN AEROBIC AND ANAEROBIC Blood Culture results may not be optimal due to an inadequate volume of blood received in culture bottles   Culture   Final    NO GROWTH < 12 HOURS Performed at [facility], [address], [place], [place] [address]    Report Status PENDING  Incomplete  Resp panel by RT-PCR (RSV, Flu A&B, Covid) Anterior Nasal Swab     Status: None   Collection Time: 05/05/24  8:26  PM   Specimen: Anterior Nasal Swab  Result Value Ref Range Status   SARS Coronavirus 2 by RT PCR NEGATIVE NEGATIVE Final   Influenza A by PCR NEGATIVE NEGATIVE Final   Influenza B by PCR NEGATIVE NEGATIVE Final    Comment: (NOTE) The Xpert Xpress SARS-CoV-2/FLU/RSV plus assay is intended as an  aid in the diagnosis of influenza from Nasopharyngeal swab specimens and should not be used as a sole basis for treatment. Nasal washings and aspirates are unacceptable for Xpert Xpress SARS-CoV-2/FLU/RSV testing.  Fact Sheet for Patients: BloggerCourse.com  Fact Sheet for Healthcare Providers: SeriousBroker.it  This test is not yet approved or cleared by the [place]  FDA and has been authorized for detection and/or diagnosis of SARS-CoV-2 by FDA under an Emergency Use Authorization (EUA). This EUA will remain in effect (meaning this test can be used) for the duration of the COVID-19 declaration under Section 564(b)(1) of the Act, 21 U.S.C. section 360bbb-3(b)(1), unless the authorization is terminated or revoked.     Resp Syncytial Virus by PCR NEGATIVE NEGATIVE Final    Comment: (NOTE) Fact Sheet for Patients: BloggerCourse.com  Fact Sheet for Healthcare Providers: SeriousBroker.it  This test is not yet approved or cleared by the [place]  FDA and has been authorized for detection and/or diagnosis of SARS-CoV-2 by FDA under an Emergency Use Authorization (EUA). This EUA will remain in effect (meaning this test can be used) for the duration of the COVID-19 declaration under Section 564(b)(1) of the Act, 21 U.S.C. section 360bbb-3(b)(1), unless the authorization is terminated or revoked.  Performed at [facility], [address] [place], [place] [address]     RADIOLOGY STUDIES/RESULTS: CT Angio Chest PE W and/or Wo Contrast Result Date: 05/05/2024 CLINICAL DATA:   Clinical concern for pulmonary embolism. EXAM: CT ANGIOGRAPHY CHEST WITH CONTRAST TECHNIQUE: Multidetector CT imaging of the chest was performed using the standard protocol during bolus administration of intravenous contrast. Multiplanar CT image reconstructions and MIPs were obtained to evaluate the vascular anatomy. RADIATION DOSE REDUCTION: This exam was performed according to the departmental dose-optimization program which includes automated exposure control, adjustment of the mA and/or kV according to patient size and/or use of iterative reconstruction technique. CONTRAST:  75mL OMNIPAQUE  IOHEXOL  350 MG/ML SOLN COMPARISON:  None Available. FINDINGS: Cardiovascular: Satisfactory opacification of the pulmonary arteries to the segmental level. No evidence of pulmonary embolism. Normal heart size. No pericardial effusion. Mediastinum/Nodes: No enlarged mediastinal, hilar, or axillary lymph nodes. Thyroid gland, trachea, and esophagus demonstrate no significant findings. Lungs/Pleura: Marked severity patchy infiltrates are seen throughout both lungs. No pleural effusion or pneumothorax is identified. Upper Abdomen: No acute abnormality. Musculoskeletal: No chest wall abnormality. No acute or significant osseous findings. Review of the MIP images confirms the above findings. IMPRESSION: 1. No evidence of pulmonary embolism. 2. Marked severity bilateral infiltrates, consistent with multifocal pneumonia. Electronically Signed   By: Suzen Dials M.D.   On: 05/05/2024 22:06   DG Chest Port 1 View if patient is in a treatment room. Result Date: 05/05/2024 CLINICAL DATA:  Short of breath, hypoxia EXAM: PORTABLE CHEST 1 VIEW COMPARISON:  01/09/2020 FINDINGS: Single frontal view of the chest demonstrates an enlarged cardiac silhouette. Diffuse bilateral interstitial and ground-glass opacities consistent with edema or widespread pneumonia. No effusion or pneumothorax. No acute bony abnormalities. IMPRESSION: 1.  Diffuse interstitial and ground-glass opacities, which may reflect edema or widespread pneumonia, including atypical pneumonia such as viral etiologies. 2. Enlarged cardiac silhouette. Electronically Signed   By: Ozell Daring M.D.   On: 05/05/2024 19:59     LOS: 2 days   Donalda Applebaum, MD  [facility]    To contact the attending provider between 7A-7P or the covering provider during after hours 7P-7A, please log into the web site [email or web address] and access using universal [place] password for that  web site. If you do not have the password, please call the hospital operator.  05/07/2024, 8:44 AM

## 2024-05-07 NOTE — Consult Note (Signed)
 NAME:  [name], MRN:  [id number], DOB:  [birth year], LOS: 2 ADMISSION DATE:  05/05/2024, CONSULTATION DATE: 9/30 REFERRING MD:  Kellie, CHIEF COMPLAINT: Acute hypoxic respiratory failure  History of Present Illness:  43 year old female w/ hx of Chron's disease currently on Skyrizi and sulfasalazine (switched from infliximab July 2025 d/t fatigue  and subtherapeutic levels in spite of escalated dosing).  Still active smoker. Has had chronic cough since April 2025. Review of records demonstrate essentially monthly courses of antibiotics for recurrent URI type symptoms since then. Most recently 8/28 URI symptoms. Treated w/ azithro. Of note had clear CXR as of 9/3.  9/5 seen by PCP; Treated w/ steroids, nebs, was already on doxy for leg wound and instructed to continue. Message sent to rheum re: possible dose reduction or holiday from Skyrizi, 9/25 called rhuem. Still having cough, and developed chest pressure and worsening shortness of breath so presented to ER at [facility] 9/29 w/ cc: 4 d h/o progressive increase in SOB, productive cough w/yellow sputum, substernal Chest pain. No improvement w/ rescue SA bronchodilators. On arrival was tachypneic, room air sats 80s. Afebrile, WBC ct 17.3. covid/flu/rsv neg, CT angio negative for PE, did show bilateral diffuse GG infiltrates. PCT neg.   Was placed on oxygen (high flow [place]), CAP coverage w/ azith and ctx, also treated w/ IV steroids.  On 9/30 still coughing. O2 down to 4-5 liters. Prior ana anti-MPO and anti-PR3 ab neg  Given recurrence of symptoms, Biologic therapy and risk of immunosuppression, and also concern for possibility of pneumonitis Pulm asked to see    Pertinent  Medical History  Crohn's disease, she is s/p 2 bowel resections, was on infliximab for years (had been in remission). Received 1st reduction dose  w/ Skyrizi July 1 (changed d/t escalating dose requirements of infliximab d/t subtherapeutic drug levels and worsening fatigue). Sept  2024 EGD and colonoscopy nml Chronic cough (since April 2025) Smoker Small vessel neuropathy, feso4 def anemia, prior pulmonary infections and ARDS ( May 2019 rhinovirus->required mechanical ventilation)  Prior renal failure ADHD Anxiety Chronic pain Epilepsy  Significant Hospital Events: Including procedures, antibiotic start and stop dates in addition to other pertinent events   9/30 admitted with acute hypoxic respiratory failure, CT negative for pulmonary emboli,, did have diffuse pulmonary infiltrates bilateral.  Respiratory influenza, RSV and COVID-negative started on broad-spectrum antibiotics and IV steroids  Interim History / Subjective:  Feels a little better  Objective    Blood pressure 123/83, pulse 68, temperature 97.8 F (36.6 C), temperature source Oral, resp. rate 18, height 5' 3 (1.6 m), weight 68 kg, last menstrual period 04/12/2024, SpO2 95%.       No intake or output data in the 24 hours ending 05/07/24 1056 Filed Weights   05/05/24 1917  Weight: 68 kg    Examination: General: 43 year old female patient sitting in bed no acute distress on 4 L HENT: Normocephalic atraumatic no JVD, does have hoarse phonation Lungs: Fine basilar rales no accessory use Cardiovascular: Regular rate and rhythm Abdomen: Soft nontender Extremities: Warm dry Neuro: Oriented GU: Voids  Resolved problem list   Assessment and Plan   Chron's disease on skyrizi (has been on chronic immunosuppression for  via biologic therapy for years. Skyrizi started July 2025) Colon resxn x2 (h/o) Chronic cough (since April 2025) Acute hypoxic respiratory failure w/ diffuse pulmonary infiltrates.  Tobacco abuse  Possible COPD  RLE ulcer s/p bx c/w resolving hematoma  HTN Fibromyalgia Mood disorder GERD  Pulm prob list Acute hypoxic respiratory failure in setting of diffuse pulmonary infiltrates.  Ddx: infectious given chronic immunosuppression, drug induced pneumonitis (Infliximab  w/ extensive case reports of pneumonitis, ILD, eosinophilc PNA, organizing PNA, information on skyrizi less data but isolated cases) auto-immune in nature, related to extraintestinal manifestation of Chron's vs other autoimmune process?  Also consider poorly controlled reflux versus esophageal dysfunction with aspiration Plan/rec Continuing current antibiotics systemic steroids Follow-up respiratory viral culture panel Follow-up urine Legionella Given she is on immunosuppression need to consider bronchioloalveolar lavage to rule out additional potential infectious etiology Will send serologies for ILD She should have an esophagram prior to discharge, ensuring no esophageal dysmotility or stricture Labs   CBC: Recent Labs  Lab 05/05/24 1930 05/06/24 0041  WBC 17.3*  --   NEUTROABS 15.1*  --   HGB 13.5 11.6*  HCT 42.6 34.0*  MCV 112.1*  --   PLT 235  --     Basic Metabolic Panel: Recent Labs  Lab 05/05/24 1930 05/06/24 0041  NA 135 135  K 3.3* 3.3*  CL 104  --   CO2 17*  --   GLUCOSE 110*  --   BUN 15  --   CREATININE 1.15*  --   CALCIUM  9.1  --    GFR: Estimated Creatinine Clearance: 59 mL/min (A) (by C-G formula based on SCr of 1.15 mg/dL (H)). Recent Labs  Lab 05/05/24 1930 05/05/24 1949  PROCALCITON 0.60  --   WBC 17.3*  --   LATICACIDVEN  --  1.1    Liver Function Tests: Recent Labs  Lab 05/05/24 1930  AST 39  ALT 22  ALKPHOS 175*  BILITOT 0.3  PROT 7.1  ALBUMIN 2.9*   No results for input(s): LIPASE, AMYLASE in the last 168 hours. No results for input(s): AMMONIA in the last 168 hours.  ABG    Component Value Date/Time   PHART 7.347 (L) 12/14/2017 0352   PCO2ART 45.9 12/14/2017 0352   PO2ART 129.0 (H) 12/14/2017 0352   HCO3 18.6 (L) 05/06/2024 0041   TCO2 20 (L) 05/06/2024 0041   ACIDBASEDEF 7.0 (H) 05/06/2024 0041   O2SAT 99 05/06/2024 0041     Coagulation Profile: Recent Labs  Lab 05/05/24 1930  INR 1.0    Cardiac  Enzymes: No results for input(s): CKTOTAL, CKMB, CKMBINDEX, TROPONINI in the last 168 hours.  HbA1C: Hgb A1c MFr Bld  Date/Time Value Ref Range Status  10/10/2013 05:00 AM 5.4 <5.7 % Final    Comment:    (NOTE)                                                                       According to the ADA Clinical Practice Recommendations for 2011, when HbA1c is used as a screening test:  >=6.5%   Diagnostic of Diabetes Mellitus           (if abnormal result is confirmed) 5.7-6.4%   Increased risk of developing Diabetes Mellitus References:Diagnosis and Classification of Diabetes Mellitus,Diabetes Care,2011,34(Suppl 1):S62-S69 and Standards of Medical Care in         Diabetes - 2011,Diabetes Care,2011,34 (Suppl 1):S11-S61.  10/25/2010 04:59 AM (H) <5.7 % Final   5.7 (NOTE)  According to the ADA Clinical Practice Recommendations for 2011, when HbA1c is used as a screening test:   >=6.5%   Diagnostic of Diabetes Mellitus           (if abnormal result  is confirmed)  5.7-6.4%   Increased risk of developing Diabetes Mellitus  References:Diagnosis and Classification of Diabetes Mellitus,Diabetes Care,2011,34(Suppl 1):S62-S69 and Standards of Medical Care in         Diabetes - 2011,Diabetes Care,2011,34  (Suppl 1):S11-S61.    CBG: No results for input(s): GLUCAP in the last 168 hours.  Review of Systems:   Review of Systems  Constitutional:  Positive for malaise/fatigue. Negative for fever.  HENT:  Positive for sore throat.   Eyes: Negative.   Respiratory:  Positive for cough, sputum production, shortness of breath and wheezing.        Cough at baseline more nocturnal in nature.  Worse when lays down.  Does have breakthrough acid reflux symptoms in spite of daily PPI and H2 blocker  Cardiovascular:  Positive for chest pain.  Gastrointestinal:  Positive for heartburn.  Genitourinary: Negative.   Musculoskeletal:  Negative.   Skin: Negative.   Neurological: Negative.   Endo/Heme/Allergies: Negative.      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), ADD (attention deficit disorder), ADHD (12/02/2009), Allergic rhinitis, cause unspecified (01/21/2011), Anemia, ANXIETY (03/24/2009), B12 DEFICIENCY (04/28/2009), Bronchitis (12/2017), BURSITIS, RIGHT KNEE (02/05/2010), Cellulitis and abscess of leg, except foot (02/05/2010), Cervicalgia (12/02/2009), Chronic back pain, COMMON MIGRAINE (02/05/2010), CROHN'S DISEASE-SMALL INTESTINE (05/19/2009), ECZEMA (05/20/2010), Endometriosis (08/04/2011), Fatigue, Fibromyalgia, GERD (03/24/2009), HEADACHE, CHRONIC (03/24/2009), History of hiatal hernia, History of stomach ulcers (12/2016), HYPERTENSION (03/24/2009), Osteoarthritis, OTITIS MEDIA, LEFT (08/12/2010), Pneumonia (12/06/2017-12/20/2017), SMOKER (12/02/2009), Spine pain (01/21/2011), UC (ulcerative colitis) (HCC), VITAMIN B1 DEFICIENCY (09/21/2009), and Wheezing (08/12/2010).   Surgical History:   Past Surgical History:  Procedure Laterality Date   AUGMENTATION MAMMAPLASTY Bilateral 2004   COLON SURGERY  2015   13 ft intestines; Crohn's   ENDOMETRIAL ABLATION  01/2009   Thinks laproscopic with possible transvaginal   HARDWARE REMOVAL Right 07/25/2019   Procedure: HARDWARE REMOVAL RIGHT HIP;  Surgeon: Josefina Chew, MD;  Location: [facility];  Service: Orthopedics;  Laterality: Right;   HIP PINNING,CANNULATED Right 01/13/2019   Procedure: CANNULATED HIP PINNING;  Surgeon: Josefina Chew, MD;  Location: [facility];  Service: Orthopedics;  Laterality: Right;   PARTIAL COLECTOMY  04/25/2019   at [facility]   PERCUTANEOUS PINNING Right 07/25/2019   Procedure: PERCUTANEOUS PINNING OF RIGHT FEMORAL PROXIMAL NECK;  Surgeon: Josefina Chew, MD;  Location: [facility];  Service: Orthopedics;  Laterality: Right;   RECTAL PROLAPSE REPAIR  2015   STOMACH SURGERY   2015   x4      Social History:   reports that she has been smoking cigarettes. She started smoking about 25 years ago. She has a 10 pack-year smoking history. She has never used smokeless tobacco. She reports that she does not currently use alcohol. She reports that she does not currently use drugs after having used the following drugs: Other-see comments.   Family History:  Her family history includes Breast cancer (age of onset: 93) in her mother; Cancer in her maternal grandfather, maternal grandmother, and mother; Clotting disorder in her mother; Heart Problems in her mother.   Allergies Allergies  Allergen Reactions   Penicillins Hives    Has patient had a PCN reaction causing immediate  rash, facial/tongue/throat swelling, SOB or lightheadedness with hypotension: Yes Has patient had a PCN reaction causing severe rash involving mucus membranes or skin necrosis: Unk Has patient had a PCN reaction that required hospitalization: Yes Has patient had a PCN reaction occurring within the last 10 years: No If all of the above answers are NO, then may proceed with Cephalosporin use.        Home Medications  Prior to Admission medications   Medication Sig Start Date End Date Taking? Authorizing Provider  acetaminophen  (TYLENOL ) 650 MG CR tablet Take 1,300 mg by mouth in the morning and at bedtime.   Yes [provider]  ALPRAZolam  (XANAX ) 1 MG tablet Take 1 mg by mouth at bedtime as needed for anxiety or sleep.   Yes [provider]  amitriptyline  (ELAVIL ) 75 MG tablet Take 75 mg by mouth at bedtime. 01/08/24  Yes [provider]  amphetamine -dextroamphetamine  (ADDERALL) 20 MG tablet Take 20 mg by mouth 2 (two) times daily. 10/29/19  Yes [provider]  baclofen  (LIORESAL ) 20 MG tablet Take 20 mg by mouth 2 (two) times daily as needed for muscle spasms.   Yes [provider]  cetirizine (ZYRTEC) 10 MG chewable tablet Chew 10 mg by mouth daily in  the afternoon.   Yes [provider]  clonazePAM (KLONOPIN) 1 MG disintegrating tablet Take 1 mg by mouth daily as needed (for panic attacks).   Yes [provider]  Cyanocobalamin  1000 MCG/ML KIT Inject as directed every 14 (fourteen) days.   Yes [provider]  esomeprazole  (NEXIUM ) 40 MG capsule Take 40 mg by mouth 2 (two) times daily before a meal.   Yes [provider]  famotidine  (PEPCID ) 40 MG tablet Take 40 mg by mouth 2 (two) times daily.   Yes [provider]  folic acid  (FOLVITE ) 1 MG tablet Take 5 mg by mouth daily. 09/13/23 09/12/24 Yes [provider]  gabapentin  (NEURONTIN ) 800 MG tablet Take 1 tablet (800 mg total) by mouth 4 (four) times daily. 10/28/21  Yes Gayland Lauraine PARAS, NP  ibuprofen  (ADVIL ) 800 MG tablet Take 800 mg by mouth in the morning and at bedtime.   Yes [provider]  levofloxacin (LEVAQUIN) 500 MG tablet Take 500 mg by mouth daily.   Yes [provider]  Magnesium  Glycinate 100 MG CAPS Take 300 mg by mouth daily in the afternoon.   Yes [provider]  NIFEdipine (PROCARDIA-XL/NIFEDICAL-XL) 30 MG 24 hr tablet Take 30 mg by mouth daily. 01/01/24 05/05/25 Yes [provider]  ondansetron  (ZOFRAN -ODT) 8 MG disintegrating tablet Take 8 mg by mouth every 8 (eight) hours as needed for nausea or vomiting.   Yes [provider]  oxyCODONE  (ROXICODONE ) 15 MG immediate release tablet Take 15 mg by mouth every 4 (four) hours as needed for moderate pain (pain score 4-6).   Yes [provider]  potassium chloride  SA (KLOR-CON ) 20 MEQ tablet Take 20 mEq by mouth daily.   Yes [provider]  rifaximin (XIFAXAN) 550 MG TABS tablet Take 550 mg by mouth 3 (three) times daily. 05/13/23 05/09/24 Yes [provider]  Simethicone  (PHAZYME MAXIMUM STRENGTH) 250 MG CAPS Take 250 mg by mouth daily as needed (for bloating).   Yes [provider]  sulfaSALAzine  (AZULFIDINE) 500 MG EC tablet Take 500 mg by mouth 2 (two) times daily. 04/01/24  Yes [provider]  UNABLE TO FIND FERRITIN - infusion given every 14 days   Yes  [provider]  Vitamin D -Vitamin K (VITAMIN K2-VITAMIN D3 PO) Take 1 tablet by mouth daily in the afternoon.   Yes [provider]  SKYRIZI 360 MG/2.4ML SOCT Inject 360 mg into the skin every 28 (twenty-eight) days. Patient not taking: Reported on 05/05/2024 03/04/24   [provider]  amLODipine  (NORVASC ) 10 MG tablet Take 1 tablet (10 mg total) by mouth daily. 09/30/11 09/12/19  Norleen Lynwood ORN, MD     Critical care time: NA

## 2024-05-07 NOTE — Plan of Care (Signed)
 ?  Problem: Clinical Measurements: ?Goal: Will remain free from infection ?Outcome: Progressing ?  ?

## 2024-05-07 NOTE — Plan of Care (Signed)

## 2024-05-07 NOTE — TOC Initial Note (Signed)
 Transition of Care [facility]) - Initial/Assessment Note    Patient Details  Name: [name] MRN: [id number] Date of Birth: March 25, 1981  Transition of Care [facility]) CM/SW Contact:    Marval Gell, RN Phone Number: 05/07/2024, 1:38 PM  Clinical Narrative:                  Beatris w patient at bedside. She states that she lives at home with her mom and her child. Acutely on oxygen- will follow for weaning. She states she has not smoked in a few weeks and plans to not restart after DC.  PCP - Alyse at [facility] in [place].  She denies problems obtaining medications, drives, and describes self as independent.  Expected Discharge Plan: Home/Self Care Barriers to Discharge: Continued Medical Work up   Patient Goals and CMS Choice Patient states their goals for this hospitalization and ongoing recovery are:: to go home          Expected Discharge Plan and Services   Discharge Planning Services: CM Consult   Living arrangements for the past 2 months: Single Family Home                                      Prior Living Arrangements/Services Living arrangements for the past 2 months: Single Family Home Lives with:: Minor Children, Parents                   Activities of Daily Living      Permission Sought/Granted                  Emotional Assessment              Admission diagnosis:  Multifocal pneumonia [J18.9] Acute hypoxic respiratory failure (HCC) [J96.01] Patient Active Problem List   Diagnosis Date Noted   COPD with acute exacerbation (HCC) 05/06/2024   Chest pain 05/06/2024   Multifocal pneumonia 05/05/2024   Mood disorder 10/28/2021   Dysesthesia 10/28/2021   Painful orthopaedic hardware 07/25/2019   Right hip pain 07/25/2019   Intra-abdominal fluid collection 05/06/2019   Crohn's disease (HCC) 05/02/2019   Fracture of femoral neck, right, closed (HCC) 01/13/2019   Subarachnoid hemorrhage (HCC) 10/20/2018   Seizures (HCC)  10/20/2018   Microcytic anemia 01/23/2018   Iron deficiency anemia 01/22/2018   Erosive esophagitis 12/27/2017   Major depressive disorder 12/26/2017   Leukocytosis    Tobacco abuse    Alcohol abuse    Polysubstance abuse (HCC)    Attention deficit hyperactivity disorder (ADHD)    Vitamin B12 deficiency    Fibromyalgia    Crohn's disease with complication (HCC)    Uncomplicated asthma    Thrombocytosis    Acute hypoxemic respiratory failure (HCC)    Mild renal insufficiency    Hypocalcemia 12/06/2017   Hypomagnesemia 12/06/2017   Severe sepsis (HCC) 12/06/2017   Regional enteritis of small intestine with large intestine (HCC) 10/27/2013   Abdominal pain 10/09/2013   Rectal prolapse 10/09/2013   Visual disturbances 03/28/2012   Chronic narcotic dependence (HCC) 11/02/2011   Abdominal pain, generalized 11/02/2011   Hypokalemia 11/02/2011   Radiculopathy 11/02/2011   Neuralgia and neuritis 11/02/2011   Bilateral shoulder pain 10/01/2011   Cervical radiculopathy 09/30/2011   Pain, joint, multiple sites 09/30/2011   Brachial neuritis 09/30/2011   Lumbar radicular pain 08/04/2011   Chronic neck pain 08/04/2011   Endometriosis 08/04/2011  Allergic rhinitis 01/21/2011   Chronic back pain 01/21/2011   B12 deficiency 12/13/2010   ECZEMA 05/20/2010   Migraine without aura 02/05/2010   Attention deficit hyperactivity disorder (ADHD) 12/02/2009   ACNE ROSACEA 12/02/2009   CROHN'S DISEASE-SMALL INTESTINE 05/19/2009   Anxiety state 03/24/2009   Essential hypertension 03/24/2009   GERD 03/24/2009   Headache 03/24/2009   PCP:  System, Provider Not In Pharmacy:   [id number] [place], [place] - [address] [place] [address] Phone: [phone] Fax: [phone]     Social Drivers of Health (SDOH) Social History: SDOH Screenings   Food Insecurity: No Food Insecurity (05/06/2024)  Housing: Low Risk   (05/06/2024)  Transportation Needs: No Transportation Needs (05/06/2024)  Utilities: Not At Risk (05/06/2024)  Depression (PHQ2-9): Medium Risk (07/09/2019)  Financial Resource Strain: Patient Declined (01/10/2024)   Received from [facility]  Physical Activity: Unknown (01/10/2024)   Received from [facility]  Social Connections: Unknown (12/21/2021)   Received from [facility]  Stress: Patient Declined (01/10/2024)   Received from [facility]  Tobacco Use: High Risk (05/06/2024)   SDOH Interventions:     Readmission Risk Interventions     No data to display

## 2024-05-08 DIAGNOSIS — J188 Other pneumonia, unspecified organism: Secondary | ICD-10-CM

## 2024-05-08 DIAGNOSIS — A419 Sepsis, unspecified organism: Secondary | ICD-10-CM | POA: Diagnosis not present

## 2024-05-08 DIAGNOSIS — R071 Chest pain on breathing: Secondary | ICD-10-CM

## 2024-05-08 DIAGNOSIS — J441 Chronic obstructive pulmonary disease with (acute) exacerbation: Secondary | ICD-10-CM | POA: Diagnosis not present

## 2024-05-08 DIAGNOSIS — F17210 Nicotine dependence, cigarettes, uncomplicated: Secondary | ICD-10-CM

## 2024-05-08 DIAGNOSIS — J9601 Acute respiratory failure with hypoxia: Secondary | ICD-10-CM | POA: Diagnosis not present

## 2024-05-08 LAB — CBC
HCT: 42.6 % (ref 36.0–46.0)
Hemoglobin: 14.2 g/dL (ref 12.0–15.0)
MCH: 35.2 pg — ABNORMAL HIGH (ref 26.0–34.0)
MCHC: 33.3 g/dL (ref 30.0–36.0)
MCV: 105.7 fL — ABNORMAL HIGH (ref 80.0–100.0)
Platelets: 349 K/uL (ref 150–400)
RBC: 4.03 MIL/uL (ref 3.87–5.11)
RDW: 14.6 % (ref 11.5–15.5)
WBC: 10.2 K/uL (ref 4.0–10.5)
nRBC: 0 % (ref 0.0–0.2)

## 2024-05-08 LAB — COMPREHENSIVE METABOLIC PANEL WITH GFR
ALT: 29 U/L (ref 0–44)
AST: 23 U/L (ref 15–41)
Albumin: 2.8 g/dL — ABNORMAL LOW (ref 3.5–5.0)
Alkaline Phosphatase: 153 U/L — ABNORMAL HIGH (ref 38–126)
Anion gap: 14 (ref 5–15)
BUN: 22 mg/dL — ABNORMAL HIGH (ref 6–20)
CO2: 18 mmol/L — ABNORMAL LOW (ref 22–32)
Calcium: 9.4 mg/dL (ref 8.9–10.3)
Chloride: 102 mmol/L (ref 98–111)
Creatinine, Ser: 0.93 mg/dL (ref 0.44–1.00)
GFR, Estimated: >60 mL/min (ref >60)
Glucose, Bld: 117 mg/dL — ABNORMAL HIGH (ref 70–99)
Potassium: 4.1 mmol/L (ref 3.5–5.1)
Sodium: 134 mmol/L — ABNORMAL LOW (ref 135–145)
Total Bilirubin: 0.3 mg/dL (ref 0.0–1.2)
Total Protein: 6.6 g/dL (ref 6.5–8.1)

## 2024-05-08 LAB — EXPECTORATED SPUTUM ASSESSMENT W GRAM STAIN, RFLX TO RESP C

## 2024-05-08 LAB — ANCA PROFILE
Anti-MPO Antibodies: <0.2 U (ref 0.0–0.9)
Anti-PR3 Antibodies: <0.2 U (ref 0.0–0.9)
Atypical P-ANCA titer: <1:20 {titer}
C-ANCA: <1:20 {titer}
P-ANCA: <1:20 {titer}

## 2024-05-08 LAB — ANA W/REFLEX IF POSITIVE: Anti Nuclear Antibody (ANA): NEGATIVE

## 2024-05-08 LAB — RHEUMATOID FACTOR: Rheumatoid fact SerPl-aCnc: 19.5 [IU]/mL — ABNORMAL HIGH (ref <14.0)

## 2024-05-08 LAB — LEGIONELLA PNEUMOPHILA SEROGP 1 UR AG: L. pneumophila Serogp 1 Ur Ag: NEGATIVE

## 2024-05-08 LAB — SJOGRENS SYNDROME-A EXTRACTABLE NUCLEAR ANTIBODY: SSA (Ro) (ENA) Antibody, IgG: <0.2 AI (ref 0.0–0.9)

## 2024-05-08 LAB — SJOGRENS SYNDROME-B EXTRACTABLE NUCLEAR ANTIBODY: SSB (La) (ENA) Antibody, IgG: <0.2 AI (ref 0.0–0.9)

## 2024-05-08 LAB — CYCLIC CITRUL PEPTIDE ANTIBODY, IGG/IGA: CCP Antibodies IgG/IgA: <1 U (ref 0–19)

## 2024-05-08 MED ORDER — HYDROMORPHONE HCL 1 MG/ML IJ SOLN
1.0000 mg | INTRAMUSCULAR | Status: DC | PRN
  Administered 2024-05-08 – 2024-05-09 (×6): 1 mg via INTRAVENOUS
  Filled 2024-05-08 (×8): qty 1

## 2024-05-08 MED ORDER — OXYCODONE HCL 5 MG PO TABS
5.0000 mg | ORAL_TABLET | Freq: Once | ORAL | Status: AC
  Administered 2024-05-08: 5 mg via ORAL
  Filled 2024-05-08: qty 1

## 2024-05-08 MED ORDER — POLYETHYLENE GLYCOL 3350 17 G PO PACK
17.0000 g | PACK | Freq: Every day | ORAL | Status: DC
  Filled 2024-05-08 (×2): qty 1

## 2024-05-08 MED ORDER — OXYCODONE HCL 5 MG PO TABS
20.0000 mg | ORAL_TABLET | ORAL | Status: DC | PRN
  Administered 2024-05-08 – 2024-05-09 (×6): 20 mg via ORAL
  Filled 2024-05-08 (×6): qty 4

## 2024-05-08 MED ORDER — DEXTROSE 5 % IV SOLN
320.0000 mg | Freq: Three times a day (TID) | INTRAVENOUS | Status: DC
  Administered 2024-05-08 – 2024-05-09 (×3): 320 mg via INTRAVENOUS
  Filled 2024-05-08 (×5): qty 20

## 2024-05-08 MED ORDER — NALOXONE HCL 0.4 MG/ML IJ SOLN: 0.4000 mg | INTRAMUSCULAR | Status: DC | PRN

## 2024-05-08 NOTE — Progress Notes (Addendum)
 1) Myopia with astigmatism and presbyopia OD/OS  -- new spec Rx given  -- finalized CL Rx - trials dispensed  -- recommended +1.00 OTC readers with CLs prn  -- patient re-educated on proper lens care and hygiene  -- keep annual visits    ADDENDUM (09/03/2024): Patient reports that she prefers the previous Rx - finalized in chart.

## 2024-05-08 NOTE — Progress Notes (Signed)
 PROGRESS NOTE        PATIENT DETAILS Name: [name] Age: 43 y.o. Sex: female Date of Birth: 1981-01-20 Admit Date: 05/05/2024 Admitting Physician Editha Ram, MD ERE:[id number], Provider Not In  Brief Summary: Patient is a 43 y.o.  female with history of Crohn's disease-recently completed induction of Skyrizi (previously on infliximab for years), ongoing tobacco use-who has had a URI (productive cough) since April of this year-completed several rounds of antibiotics (Zithromax /doxycycline/most recently Levaquin) presented with approximately 5-7-day history of worsening shortness of breath/chest pressure-patient was found to have acute hypoxic respiratory failure-in extensive bilateral pulmonary infiltrates.  Significant events: 9/29>> admit to [facility]-hypoxia-bilateral lung infiltrates.  Significant studies: 9/28>> CTA chest: No PE, patchy bilateral infiltrates.  Significant microbiology data: 9/28>> COVID PCR/influenza PCR/RSV PCR: Negative 9/28>> blood cultures: No growth 9/30>> respiratory virus panel:negative  Procedures: None  Consults: PCCM  Subjective: Lying comfortably in bed-remains on 4-5 L of HFNC today.  Remains essentially unchanged.  Objective: Vitals: Blood pressure 116/81, pulse 72, temperature 97.6 F (36.4 C), temperature source Oral, resp. rate 18, height 5' 3 (1.6 m), weight 68 kg, last menstrual period 04/12/2024, SpO2 94%.   Exam: Gen Exam:Alert awake-not in any distress HEENT:atraumatic, normocephalic Chest: B/L clear to auscultation anteriorly CVS:S1S2 regular Abdomen:soft non tender, non distended Extremities:+ edema Neurology: Non focal Skin: no rash  Pertinent Labs/Radiology:    Latest Ref Rng & Units 05/08/2024    9:21 AM 05/07/2024    1:34 PM 05/06/2024   12:41 AM  CBC  WBC 4.0 - 10.5 K/uL 10.2  10.5    Hemoglobin 12.0 - 15.0 g/dL 85.7  87.5  88.3   Hematocrit 36.0 - 46.0 % 42.6  37.5  34.0    Platelets 150 - 400 K/uL 349  303      Lab Results  Component Value Date   NA 134 (L) 05/08/2024   K 4.1 05/08/2024   CL 102 05/08/2024   CO2 18 (L) 05/08/2024      Assessment/Plan: Acute hypoxic respiratory failure  Remains stable on 4-5 L of HFNC. Has extensive bilateral infiltrates on CT imaging-unclear etiology-see below  Extensive bilateral lung infiltrates Has URI symptoms since April 2025-has completed several rounds of outpatient antibiotics (Zithromax  x 2, Doxy, most recently Levaquin).  Outpatient chest x-ray in Care everywhere x 2 were negative-these were done several weeks ago. She is on biologic agents for Crohn's disease for the past few years (recently switched from infliximab to [facility] in July 2025). Differentials are broad-atypical infections (LDH elevated-?  PJP), hypersensitive pneumonitis, ILD/inflammation are all possible.  (Per review of care everywhere-ANA neg February 2025, anti-MPO/anti-PR3 antibody - March 2025) Inflammatory markers (ESR/CRP) significantly elevated Remains on steroids/Rocephin /Zithromax -Bactrim  added yesterday after discussion with PCCM given concerns for possible PJP. Discussed with RN staff-I have asked that we collect sputum and send for PJP smear. Await hypersensitivity panel/autoimmune workup. Await ID/PCCM input.  History of stricturing Crohn's disease-s/p 2 small bowel resections Longstanding history of Crohn's disease-follows with GI at [facility] Previously on infliximab-switch to [employer] this past July-completed induction and was scheduled to start maintenance Skyrizi last week. Also on sulfasalazine (Per patient-she has previously been on adalimumab 2012- then ustekinumab 2019, and then infliximab plus azathioprine from 20 22-20 25-and then recently switched to [employer])  RLE ulcer S/p biopsy at [facility]-apparently consistent with resolving hematoma Supportive care at this point.  ?  COPD No formal diagnosis-given her longstanding  history of tobacco use-she was recently started on a rescue inhaler. For now continue with bronchodilators.  HTN Nifedipine  Fibromyalgia/cervicalgia/chronic pain Neurontin /as needed oxycodone /as needed baclofen   Mood disorder Seems stable Continue as needed Klonopin/Elavil /Adderall  GERD PPI  Code status:   Code Status: Full Code   DVT Prophylaxis:enoxaparin  (LOVENOX ) injection 40 mg Start: 05/06/24 1000    Family Communication: None at bedside   Disposition Plan: Status is: Inpatient Remains inpatient appropriate because: Severity of illness   Planned Discharge Destination:Home   Diet: Diet Order             Diet regular Room service appropriate? Yes; Fluid consistency: Thin  Diet effective now                     Antimicrobial agents: Anti-infectives (From admission, onward)    Start     Dose/Rate Route Frequency Ordered Stop   05/08/24 1400  sulfamethoxazole -trimethoprim  (BACTRIM ) 320 mg of trimethoprim  in dextrose  5 % 500 mL IVPB        320 mg of trimethoprim  346.7 mL/hr over 90 Minutes Intravenous Every 8 hours 05/08/24 0934     05/07/24 2200  azithromycin  (ZITHROMAX ) tablet 500 mg        500 mg Oral Daily 05/07/24 1328     05/07/24 2200  sulfamethoxazole -trimethoprim  (BACTRIM ) 340 mg of trimethoprim  in dextrose  5 % 500 mL IVPB  Status:  Discontinued        15 mg/kg/day of trimethoprim   68 kg 347.5 mL/hr over 90 Minutes Intravenous Every 8 hours 05/07/24 1700 05/07/24 1702   05/07/24 1815  sulfamethoxazole -trimethoprim  (BACTRIM ) 340 mg of trimethoprim  in dextrose  5 % 500 mL IVPB  Status:  Discontinued        15 mg/kg/day of trimethoprim   68 kg 347.5 mL/hr over 90 Minutes Intravenous Every 8 hours 05/07/24 1715 05/08/24 0934   05/06/24 2300  azithromycin  (ZITHROMAX ) 500 mg in sodium chloride  0.9 % 250 mL IVPB  Status:  Discontinued        500 mg 250 mL/hr over 60 Minutes Intravenous Every 24 hours 05/06/24 0212 05/07/24 1328   05/06/24 2300   cefTRIAXone  (ROCEPHIN ) 2 g in sodium chloride  0.9 % 100 mL IVPB        2 g 200 mL/hr over 30 Minutes Intravenous Every 24 hours 05/06/24 0212     05/06/24 1000  rifaximin (XIFAXAN) tablet 550 mg        550 mg Oral 3 times daily 05/06/24 0212     05/05/24 2315  cefTRIAXone  (ROCEPHIN ) 2 g in sodium chloride  0.9 % 100 mL IVPB        2 g 200 mL/hr over 30 Minutes Intravenous Once 05/05/24 2302 05/06/24 0004   05/05/24 2315  azithromycin  (ZITHROMAX ) 500 mg in sodium chloride  0.9 % 250 mL IVPB        500 mg 250 mL/hr over 60 Minutes Intravenous  Once 05/05/24 2302 05/06/24 0118        MEDICATIONS: Scheduled Meds:  amitriptyline   75 mg Oral QHS   amphetamine -dextroamphetamine   20 mg Oral BID   azithromycin   500 mg Oral Daily   budesonide  (PULMICORT ) nebulizer solution  0.25 mg Nebulization BID   enoxaparin  (LOVENOX ) injection  40 mg Subcutaneous Q24H   famotidine   40 mg Oral BID   folic acid   5 mg Oral Daily   gabapentin   800 mg Oral QID   guaiFENesin   600 mg Oral BID  ipratropium-albuterol   3 mL Nebulization Q6H   loratadine  10 mg Oral Daily   methylPREDNISolone  (SOLU-MEDROL ) injection  120 mg Intravenous Q24H   multivitamin with minerals  1 tablet Oral Daily   NIFEdipine  30 mg Oral Daily   pantoprazole   40 mg Oral BID   potassium chloride  SA  20 mEq Oral Daily   rifaximin  550 mg Oral TID   sulfaSALAzine  1,000 mg Oral Daily   Continuous Infusions:  cefTRIAXone  (ROCEPHIN )  IV 2 g (05/07/24 2320)   sulfamethoxazole -trimethoprim      PRN Meds:.acetaminophen  **OR** acetaminophen , albuterol , ALPRAZolam , baclofen , clonazePAM, HYDROmorphone  (DILAUDID ) injection, morphine  injection, ondansetron  (ZOFRAN ) IV, oxyCODONE    I have personally reviewed following labs and imaging studies  LABORATORY DATA: CBC: Recent Labs  Lab 05/05/24 1930 05/06/24 0041 05/07/24 1334 05/08/24 0921  WBC 17.3*  --  10.5 10.2  NEUTROABS 15.1*  --  9.3*  --   HGB 13.5 11.6* 12.4 14.2  HCT 42.6  34.0* 37.5 42.6  MCV 112.1*  --  106.2* 105.7*  PLT 235  --  303 349    Basic Metabolic Panel: Recent Labs  Lab 05/05/24 1930 05/06/24 0041 05/07/24 1334 05/08/24 0921  NA 135 135 138 134*  K 3.3* 3.3* 4.1 4.1  CL 104  --  106 102  CO2 17*  --  19* 18*  GLUCOSE 110*  --  122* 117*  BUN 15  --  17 22*  CREATININE 1.15*  --  0.86 0.93  CALCIUM  9.1  --  8.8* 9.4    GFR: Estimated Creatinine Clearance: 72.9 mL/min (by C-G formula based on SCr of 0.93 mg/dL).  Liver Function Tests: Recent Labs  Lab 05/05/24 1930 05/07/24 1334 05/08/24 0921  AST 39 14* 23  ALT 22 18 29   ALKPHOS 175* 134* 153*  BILITOT 0.3 0.4 0.3  PROT 7.1 6.3* 6.6  ALBUMIN 2.9* 2.6* 2.8*   No results for input(s): LIPASE, AMYLASE in the last 168 hours. No results for input(s): AMMONIA in the last 168 hours.  Coagulation Profile: Recent Labs  Lab 05/05/24 1930  INR 1.0    Cardiac Enzymes: No results for input(s): CKTOTAL, CKMB, CKMBINDEX, TROPONINI in the last 168 hours.  BNP (last 3 results) No results for input(s): PROBNP in the last 8760 hours.  Lipid Profile: No results for input(s): CHOL, HDL, LDLCALC, TRIG, CHOLHDL, LDLDIRECT in the last 72 hours.  Thyroid Function Tests: No results for input(s): TSH, T4TOTAL, FREET4, T3FREE, THYROIDAB in the last 72 hours.  Anemia Panel: No results for input(s): VITAMINB12, FOLATE, FERRITIN, TIBC, IRON, RETICCTPCT in the last 72 hours.  Urine analysis:    Component Value Date/Time   COLORURINE YELLOW 04/30/2019 0950   APPEARANCEUR CLEAR 04/30/2019 0950   LABSPEC 1.012 04/30/2019 0950   PHURINE 7.0 04/30/2019 0950   GLUCOSEU NEGATIVE 04/30/2019 0950   HGBUR NEGATIVE 04/30/2019 0950   BILIRUBINUR NEGATIVE 04/30/2019 0950   KETONESUR NEGATIVE 04/30/2019 0950   PROTEINUR 30 (A) 04/30/2019 0950   UROBILINOGEN 0.2 10/22/2010 0038   NITRITE NEGATIVE 04/30/2019 0950   LEUKOCYTESUR NEGATIVE  04/30/2019 0950    Sepsis Labs: Lactic Acid, Venous    Component Value Date/Time   LATICACIDVEN 1.1 05/05/2024 1949    MICROBIOLOGY: Recent Results (from the past 240 hours)  Culture, blood (Routine x 2)     Status: None (Preliminary result)   Collection Time: 05/05/24  7:30 PM   Specimen: BLOOD LEFT ARM  Result Value Ref Range Status   Specimen  Description BLOOD LEFT ARM  Final   Special Requests   Final    BOTTLES DRAWN AEROBIC AND ANAEROBIC Blood Culture results may not be optimal due to an inadequate volume of blood received in culture bottles   Culture   Final    NO GROWTH 3 DAYS Performed at [facility], [address], [place], [place] [address]    Report Status PENDING  Incomplete  Culture, blood (Routine x 2)     Status: None (Preliminary result)   Collection Time: 05/05/24  7:35 PM   Specimen: BLOOD LEFT FOREARM  Result Value Ref Range Status   Specimen Description BLOOD LEFT FOREARM  Final   Special Requests   Final    BOTTLES DRAWN AEROBIC AND ANAEROBIC Blood Culture results may not be optimal due to an inadequate volume of blood received in culture bottles   Culture   Final    NO GROWTH 3 DAYS Performed at [facility], [address], [place], [place] [address]    Report Status PENDING  Incomplete  Resp panel by RT-PCR (RSV, Flu A&B, Covid) Anterior Nasal Swab     Status: None   Collection Time: 05/05/24  8:26 PM   Specimen: Anterior Nasal Swab  Result Value Ref Range Status   SARS Coronavirus 2 by RT PCR NEGATIVE NEGATIVE Final   Influenza A by PCR NEGATIVE NEGATIVE Final   Influenza B by PCR NEGATIVE NEGATIVE Final    Comment: (NOTE) The Xpert Xpress SARS-CoV-2/FLU/RSV plus assay is intended as an aid in the diagnosis of influenza from Nasopharyngeal swab specimens and should not be used as a sole basis for treatment. Nasal washings and aspirates are unacceptable for Xpert Xpress SARS-CoV-2/FLU/RSV testing.  Fact Sheet for  Patients: BloggerCourse.com  Fact Sheet for Healthcare Providers: SeriousBroker.it  This test is not yet approved or cleared by the [place]  FDA and has been authorized for detection and/or diagnosis of SARS-CoV-2 by FDA under an Emergency Use Authorization (EUA). This EUA will remain in effect (meaning this test can be used) for the duration of the COVID-19 declaration under Section 564(b)(1) of the Act, 21 U.S.C. section 360bbb-3(b)(1), unless the authorization is terminated or revoked.     Resp Syncytial Virus by PCR NEGATIVE NEGATIVE Final    Comment: (NOTE) Fact Sheet for Patients: BloggerCourse.com  Fact Sheet for Healthcare Providers: SeriousBroker.it  This test is not yet approved or cleared by the [place]  FDA and has been authorized for detection and/or diagnosis of SARS-CoV-2 by FDA under an Emergency Use Authorization (EUA). This EUA will remain in effect (meaning this test can be used) for the duration of the COVID-19 declaration under Section 564(b)(1) of the Act, 21 U.S.C. section 360bbb-3(b)(1), unless the authorization is terminated or revoked.  Performed at [facility], [address], [place], [place] [address]   Respiratory (~20 pathogens) panel by PCR     Status: None   Collection Time: 05/07/24  6:37 AM   Specimen: Nasopharyngeal Swab; Respiratory  Result Value Ref Range Status   Adenovirus NOT DETECTED NOT DETECTED Final   Coronavirus 229E NOT DETECTED NOT DETECTED Final    Comment: (NOTE) The Coronavirus on the Respiratory Panel, DOES NOT test for the novel  Coronavirus (2019 nCoV)    Coronavirus HKU1 NOT DETECTED NOT DETECTED Final   Coronavirus NL63 NOT DETECTED NOT DETECTED Final   Coronavirus OC43 NOT DETECTED NOT DETECTED Final   Metapneumovirus NOT DETECTED NOT DETECTED Final  Rhinovirus / Enterovirus NOT DETECTED NOT  DETECTED Final   Influenza A NOT DETECTED NOT DETECTED Final   Influenza B NOT DETECTED NOT DETECTED Final   Parainfluenza Virus 1 NOT DETECTED NOT DETECTED Final   Parainfluenza Virus 2 NOT DETECTED NOT DETECTED Final   Parainfluenza Virus 3 NOT DETECTED NOT DETECTED Final   Parainfluenza Virus 4 NOT DETECTED NOT DETECTED Final   Respiratory Syncytial Virus NOT DETECTED NOT DETECTED Final   Bordetella pertussis NOT DETECTED NOT DETECTED Final   Bordetella Parapertussis NOT DETECTED NOT DETECTED Final   Chlamydophila pneumoniae NOT DETECTED NOT DETECTED Final   Mycoplasma pneumoniae NOT DETECTED NOT DETECTED Final    Comment: Performed at [facility], [address] [place], [place] [address]    RADIOLOGY STUDIES/RESULTS: No results found.    LOS: 3 days   Donalda Applebaum, MD  [facility]    To contact the attending provider between 7A-7P or the covering provider during after hours 7P-7A, please log into the web site [email or web address] and access using universal [place] password for that web site. If you do not have the password, please call the hospital operator.  05/08/2024, 10:49 AM
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
## 2024-05-08 NOTE — Plan of Care (Signed)
   Problem: Clinical Measurements: Goal: Ability to maintain clinical measurements within normal limits will improve Outcome: Progressing

## 2024-05-08 NOTE — Consult Note (Signed)
 NAME:  [name], MRN:  [id number], DOB:  [birth year], LOS: 3 ADMISSION DATE:  05/05/2024 CHIEF COMPLAINT:  SOB.    REFERRING MD :  Dr Raenelle.   History of Present Illness:  43 year old female w/ hx of Chron's disease currently on Skyrizi and sulfasalazine (switched from infliximab July 2025 d/t fatigue  and subtherapeutic levels in spite of escalated dosing).  Still active smoker. Has had chronic cough since April 2025. Review of records demonstrate essentially monthly courses of antibiotics for recurrent URI type symptoms since then. Most recently 8/28 URI symptoms. Treated w/ azithro. Of note had clear CXR as of 9/3.  9/5 seen by PCP; Treated w/ steroids, nebs, was already on doxy for leg wound and instructed to continue. Message sent to rheum re: possible dose reduction or holiday from Skyrizi, 9/25 called rhuem. Still having cough, and developed chest pressure and worsening shortness of breath so presented to ER at [facility] 9/29 w/ cc: 4 d h/o progressive increase in SOB, productive cough w/yellow sputum, substernal Chest pain. No improvement w/ rescue SA bronchodilators. On arrival was tachypneic, room air sats 80s. Afebrile, WBC ct 17.3. covid/flu/rsv neg, CT angio negative for PE, did show bilateral diffuse GG infiltrates. PCT neg.    Was placed on oxygen (high flow [place]), CAP coverage w/ azith and ctx, also treated w/ IV steroids.  On 9/30 still coughing. O2 down to 4-5 liters. Prior ana anti-MPO and anti-PR3 ab neg  Given recurrence of symptoms, Biologic therapy and risk of immunosuppression, and also concern for possibility of pneumonitis Pulm asked to see   Interim History / Subjective:  Off O2. Doing well.   PMH: Crohn's disease, she is s/p 2 bowel resections, was on infliximab for years (had been in remission). Received 1st reduction dose  w/ Skyrizi July 1 (changed d/t escalating dose requirements of infliximab d/t subtherapeutic drug levels and worsening fatigue). Sept 2024 EGD  and colonoscopy nml Chronic cough (since April 2025) Smoker Small vessel neuropathy, feso4 def anemia, prior pulmonary infections and ARDS ( May 2019 rhinovirus->required mechanical ventilation)  Prior renal failure ADHD Anxiety Chronic pain Epilepsy   PAST MEDICAL HISTORY :   has a past medical history of Abdominal pain, unspecified site (03/24/2009), ACNE ROSACEA (12/02/2009), ADD (attention deficit disorder), ADHD (12/02/2009), Allergic rhinitis, cause unspecified (01/21/2011), Anemia, ANXIETY (03/24/2009), B12 DEFICIENCY (04/28/2009), Bronchitis (12/2017), BURSITIS, RIGHT KNEE (02/05/2010), Cellulitis and abscess of leg, except foot (02/05/2010), Cervicalgia (12/02/2009), Chronic back pain, COMMON MIGRAINE (02/05/2010), CROHN'S DISEASE-SMALL INTESTINE (05/19/2009), ECZEMA (05/20/2010), Endometriosis (08/04/2011), Fatigue, Fibromyalgia, GERD (03/24/2009), HEADACHE, CHRONIC (03/24/2009), History of hiatal hernia, History of stomach ulcers (12/2016), HYPERTENSION (03/24/2009), Osteoarthritis, OTITIS MEDIA, LEFT (08/12/2010), Pneumonia (12/06/2017-12/20/2017), SMOKER (12/02/2009), Spine pain (01/21/2011), UC (ulcerative colitis) (HCC), VITAMIN B1 DEFICIENCY (09/21/2009), and Wheezing (08/12/2010).  has a past surgical history that includes Endometrial ablation (01/2009); Rectal prolapse repair (2015); Augmentation mammaplasty (Bilateral, 2004); Colon surgery (2015); Stomach surgery (2015); Hip pinning, cannulated (Right, 01/13/2019); Partial colectomy (04/25/2019); Percutaneous pinning (Right, 07/25/2019); and Hardware Removal (Right, 07/25/2019). Prior to Admission medications   Medication Sig Start Date End Date Taking? Authorizing Provider  acetaminophen  (TYLENOL ) 650 MG CR tablet Take 1,300 mg by mouth in the morning and at bedtime.   Yes [provider]  ALPRAZolam  (XANAX ) 1 MG tablet Take 1 mg by mouth at bedtime as needed for anxiety or sleep.   Yes [provider]  amitriptyline  (ELAVIL ) 75 MG tablet  Take 75 mg by mouth at bedtime. 01/08/24  Yes  [provider]  amphetamine -dextroamphetamine  (ADDERALL) 20 MG tablet Take 20 mg by mouth 2 (two) times daily. 10/29/19  Yes [provider]  baclofen  (LIORESAL ) 20 MG tablet Take 20 mg by mouth 2 (two) times daily as needed for muscle spasms.   Yes [provider]  cetirizine (ZYRTEC) 10 MG chewable tablet Chew 10 mg by mouth daily in the afternoon.   Yes [provider]  clonazePAM (KLONOPIN) 1 MG disintegrating tablet Take 1 mg by mouth daily as needed (for panic attacks).   Yes [provider]  Cyanocobalamin  1000 MCG/ML KIT Inject as directed every 14 (fourteen) days.   Yes [provider]  esomeprazole  (NEXIUM ) 40 MG capsule Take 40 mg by mouth 2 (two) times daily before a meal.   Yes [provider]  famotidine  (PEPCID ) 40 MG tablet Take 40 mg by mouth 2 (two) times daily.   Yes [provider]  folic acid  (FOLVITE ) 1 MG tablet Take 5 mg by mouth daily. 09/13/23 09/12/24 Yes [provider]  gabapentin  (NEURONTIN ) 800 MG tablet Take 1 tablet (800 mg total) by mouth 4 (four) times daily. 10/28/21  Yes Gayland Lauraine PARAS, NP  ibuprofen  (ADVIL ) 800 MG tablet Take 800 mg by mouth in the morning and at bedtime.   Yes [provider]  levofloxacin (LEVAQUIN) 500 MG tablet Take 500 mg by mouth daily.   Yes [provider]  Magnesium  Glycinate 100 MG CAPS Take 300 mg by mouth daily in the afternoon.   Yes [provider]  NIFEdipine (PROCARDIA-XL/NIFEDICAL-XL) 30 MG 24 hr tablet Take 30 mg by mouth daily. 01/01/24 05/05/25 Yes [provider]  ondansetron  (ZOFRAN -ODT) 8 MG disintegrating tablet Take 8 mg by mouth every 8 (eight) hours as needed for nausea or vomiting.   Yes [provider]  oxyCODONE  (ROXICODONE ) 15 MG immediate release tablet Take 15 mg by mouth every 4 (four) hours as needed for moderate pain (pain score 4-6).   Yes [provider]  potassium chloride  SA (KLOR-CON ) 20 MEQ tablet Take 20 mEq by mouth daily.   Yes [provider]  rifaximin (XIFAXAN) 550 MG TABS tablet Take 550 mg by mouth 3 (three) times daily. 05/13/23 05/09/24 Yes [provider]  Simethicone  (PHAZYME MAXIMUM STRENGTH) 250 MG CAPS Take 250 mg by mouth daily as needed (for bloating).   Yes [provider]  sulfaSALAzine (AZULFIDINE) 500 MG EC tablet Take 500 mg by mouth 2 (two) times daily. 04/01/24  Yes [provider]  UNABLE TO FIND FERRITIN - infusion given every 14 days   Yes [provider]  Vitamin D -Vitamin K (VITAMIN K2-VITAMIN D3 PO) Take 1 tablet by mouth daily in the afternoon.   Yes [provider]  SKYRIZI 360 MG/2.4ML SOCT Inject 360 mg into the skin every 28 (twenty-eight) days. Patient not taking: Reported on 05/05/2024 03/04/24   [provider]  amLODipine  (NORVASC ) 10 MG tablet Take 1 tablet (10 mg total) by mouth daily. 09/30/11 09/12/19  Norleen Lynwood ORN, MD    FAMILY HISTORY:  family history includes Breast cancer (age of onset: 39) in her mother; Cancer in her maternal grandfather, maternal grandmother, and mother; Clotting disorder in her mother; Heart Problems in her mother.  SOCIAL HISTORY:  reports that she has been smoking cigarettes. She started smoking about 25 years ago. She has a 10 pack-year smoking history. She has never used smokeless tobacco. She reports that she does not currently use alcohol. She  reports that she does not currently use drugs after having used the following drugs: Other-see comments.  REVIEW OF SYSTEMS:   Pertinent ROS as per HPI  VITAL SIGNS: Temp:  [97.6 F (36.4 C)-98.5 F (36.9 C)] 98.3 F (36.8 C) (10/01 1555) Pulse Rate:  [81-89] 81 (10/01 1555) Resp:  [18] 18 (10/01 1555) BP: (114-123)/(81-95) 114/89 (10/01 1555) SpO2:  [92 %-94 %] 93 % (10/01 1555) FiO2 (%):  [36 %] 36 % (10/01 0836)  PHYSICAL EXAMINATION: General:  Young lady in no apparent distress. Lungs: Clear to auscultation bilaterally. Heart: regular rate rhythm, no murmur appreciated.  Abdomen: non tender, non distended. Normal BS.  Neuro: axox3.  Moving all 4 extremities   Recent Labs  Lab 05/05/24 1930 05/06/24 0041 05/07/24 1334 05/08/24 0921  NA 135 135 138 134*  K 3.3* 3.3* 4.1 4.1  CL 104  --  106 102  CO2 17*  --  19* 18*  BUN 15  --  17 22*  CREATININE 1.15*  --  0.86 0.93  GLUCOSE 110*  --  122* 117*   Recent Labs  Lab 05/05/24 1930 05/06/24 0041 05/07/24 1334 05/08/24 0921  HGB 13.5 11.6* 12.4 14.2  HCT 42.6 34.0* 37.5 42.6  WBC 17.3*  --  10.5 10.2  PLT 235  --  303 349   No results found.  STUDIES:  CT scan reviewed by me showed diffuse bilateral ground glass opacities more predominant in the upper and middle lobes with mosaic attenuation.  LDH 400, CRP 25   ASSESSMENT / PLAN:  43 year old female with Crohn's disease who has been on sulfasalazine and infliximab.  Infliximab was changed to [facility] on July 1, taken 3 doses and was supposed to take 4 doses before coming to the hospital. Has been having cough since April along with yellow phlegm.  For last 6 days she noticed she was more short of breath and hence presented to the hospital.  She denied any fevers.   She has a dog and a cat and no birds at home.  She is a current smoker and has smoked half to 1 pack/day for last 20-22 years.  No other significant exposures.  Differential diagnosis: Drug-induced pneumonitis versus PJP versus other infectious causes.  Acute HP is a potential cause though no inciting cause identified. Skyrizi, sulfasalazine and Infliximab can cause pneumonitis though it is rare.    -Continue with the current dose of Solu-Medrol . -Sputum PJP PCR pending - ID consulted.  Treatment for PJP was initiated yesterday.  Okay with discontinuing Bactrim  from my perspective.  My highest differential is drug-induced pneumonitis though not sure  which drug is causing it.  Likely infliximab versus sulfasalazine. -Will try to schedule bronchoscopy with BAL with biopsy as an inpatient in near future as I am concerned that with steroids on - Echo. - fungitell pending. Galactomannan ordered to r/o fungal etiology.  - ESR 62 and other rheumatologic test ordered by NP.  RF 19.5.  Rest negative still pending. - HP panel pending - sputum cultures.  - cont w abx per ID  Sammi JONETTA Fredericks, MD Pulmonary, Critical Care & Sleep Medicine [facility] Pager: [phone]  05/08/2024, 5:43 PM

## 2024-05-08 NOTE — Progress Notes (Signed)
 Mobility Specialist Progress Note:   05/08/24 1215  Mobility  Activity Ambulated independently  Level of Assistance Independent  Assistive Device None  Distance Ambulated (ft) 200 ft  Activity Response Tolerated well  Mobility Referral Yes  Mobility visit 1 Mobility  Mobility Specialist Start Time (ACUTE ONLY) 1215  Mobility Specialist Stop Time (ACUTE ONLY) 1226  Mobility Specialist Time Calculation (min) (ACUTE ONLY) 11 min   Pt agreeable to mobility session. Difficult to get reliable SpO2 reading, however >90% on RA when able. No physical assistance needed. Pt back in bed with all needs met.   Therisa Rana Mobility Specialist Please contact via SecureChat or  Rehab office at [phone]

## 2024-05-08 NOTE — Plan of Care (Signed)

## 2024-05-09 ENCOUNTER — Telehealth: Payer: Self-pay

## 2024-05-09 ENCOUNTER — Encounter (HOSPITAL_COMMUNITY): Payer: Self-pay | Admitting: Pharmacy Technician

## 2024-05-09 ENCOUNTER — Other Ambulatory Visit (HOSPITAL_COMMUNITY): Payer: Self-pay

## 2024-05-09 DIAGNOSIS — J188 Other pneumonia, unspecified organism: Secondary | ICD-10-CM | POA: Diagnosis not present

## 2024-05-09 DIAGNOSIS — J441 Chronic obstructive pulmonary disease with (acute) exacerbation: Secondary | ICD-10-CM | POA: Diagnosis not present

## 2024-05-09 DIAGNOSIS — J9601 Acute respiratory failure with hypoxia: Secondary | ICD-10-CM | POA: Diagnosis not present

## 2024-05-09 DIAGNOSIS — A419 Sepsis, unspecified organism: Secondary | ICD-10-CM | POA: Diagnosis not present

## 2024-05-09 LAB — BASIC METABOLIC PANEL WITH GFR
Anion gap: 10 (ref 5–15)
BUN: 24 mg/dL — ABNORMAL HIGH (ref 6–20)
CO2: 22 mmol/L (ref 22–32)
Calcium: 9.4 mg/dL (ref 8.9–10.3)
Chloride: 104 mmol/L (ref 98–111)
Creatinine, Ser: 1.12 mg/dL — ABNORMAL HIGH (ref 0.44–1.00)
GFR, Estimated: >60 mL/min (ref >60)
Glucose, Bld: 98 mg/dL (ref 70–99)
Potassium: 3.9 mmol/L (ref 3.5–5.1)
Sodium: 136 mmol/L (ref 135–145)

## 2024-05-09 LAB — CBC
HCT: 36.2 % (ref 36.0–46.0)
Hemoglobin: 12.3 g/dL (ref 12.0–15.0)
MCH: 35.5 pg — ABNORMAL HIGH (ref 26.0–34.0)
MCHC: 34 g/dL (ref 30.0–36.0)
MCV: 104.6 fL — ABNORMAL HIGH (ref 80.0–100.0)
Platelets: 346 K/uL (ref 150–400)
RBC: 3.46 MIL/uL — ABNORMAL LOW (ref 3.87–5.11)
RDW: 14.2 % (ref 11.5–15.5)
WBC: 13.4 K/uL — ABNORMAL HIGH (ref 4.0–10.5)
nRBC: 0 % (ref 0.0–0.2)

## 2024-05-09 MED ORDER — CEFUROXIME AXETIL 500 MG PO TABS
500.0000 mg | ORAL_TABLET | Freq: Two times a day (BID) | ORAL | 0 refills | Status: AC
  Filled 2024-05-09: qty 6, 3d supply, fill #0

## 2024-05-09 MED ORDER — PREDNISONE 10 MG PO TABS
ORAL_TABLET | ORAL | 0 refills | Status: DC
  Filled 2024-05-09: qty 136, 49d supply, fill #0

## 2024-05-09 NOTE — Telephone Encounter (Signed)
 Schedule request for outpatient follow up sent.

## 2024-05-09 NOTE — Plan of Care (Signed)

## 2024-05-09 NOTE — Plan of Care (Signed)
  Problem: Education: Goal: Knowledge of General Education information will improve Description: Including pain rating scale, medication(s)/side effects and non-pharmacologic comfort measures Outcome: Progressing   Problem: Nutrition: Goal: Adequate nutrition will be maintained Outcome: Progressing   Problem: Safety: Goal: Ability to remain free from injury will improve Outcome: Progressing   Problem: Skin Integrity: Goal: Risk for impaired skin integrity will decrease Outcome: Progressing   Problem: Activity: Goal: Ability to tolerate increased activity will improve Outcome: Progressing   Problem: Respiratory: Goal: Ability to maintain adequate ventilation will improve Outcome: Progressing

## 2024-05-09 NOTE — Progress Notes (Signed)
                                                         AMA  Patient at this time expresses desire to leave the Hospital immidiately, patient has been warned that this is not Medically advisable at this time, and can result in Medical complications like Death and Disability, patient understands and accepts the risks involved and assumes full responsibilty of this decision.   Donalda Applebaum M.D on 05/09/2024 at 1:55 PM  [facility]

## 2024-05-09 NOTE — Progress Notes (Signed)
 PROGRESS NOTE        PATIENT DETAILS Name: [name] Age: 43 y.o. Sex: female Date of Birth: Jun 27, 1981 Admit Date: 05/05/2024 Admitting Physician Editha Ram, MD ERE:[email or web address], Provider Not In  Brief Summary: Patient is a 43 y.o.  female with history of Crohn's disease-recently completed induction of Skyrizi (previously on infliximab for years), ongoing tobacco use-who has had a URI (productive cough) since April of this year-completed several rounds of antibiotics (Zithromax /doxycycline/most recently Levaquin) presented with approximately 5-7-day history of worsening shortness of breath/chest pressure-patient was found to have acute hypoxic respiratory failure-in extensive bilateral pulmonary infiltrates.  Significant events: 9/29>> admit to [facility]-hypoxia-bilateral lung infiltrates.  Significant studies: 9/28>> CTA chest: No PE, patchy bilateral infiltrates.  Significant microbiology data: 9/28>> COVID PCR/influenza PCR/RSV PCR: Negative 9/28>> blood cultures: No growth 9/30>> respiratory virus panel:negative 10/01>> sputum PJP PCR: Pending  Procedures: None  Consults: PCCM ID  Subjective: Overall better-on room air.  Has chronic pain-per nursing staff-asking for IV Dilaudid .  Objective: Vitals: Blood pressure 105/72, pulse 67, temperature 97.8 F (36.6 C), temperature source Oral, resp. rate 18, height 5' 3 (1.6 m), weight 68 kg, last menstrual period 04/12/2024, SpO2 94%.   Exam: Gen Exam:Alert awake-not in any distress HEENT:atraumatic, normocephalic Chest: B/L clear to auscultation anteriorly CVS:S1S2 regular Abdomen:soft non tender, non distended Extremities:no edema Neurology: Non focal Skin: no rash  Pertinent Labs/Radiology:    Latest Ref Rng & Units 05/09/2024    2:52 AM 05/08/2024    9:21 AM 05/07/2024    1:34 PM  CBC  WBC 4.0 - 10.5 K/uL 13.4  10.2  10.5   Hemoglobin 12.0 - 15.0 g/dL 87.6  85.7  87.5   Hematocrit  36.0 - 46.0 % 36.2  42.6  37.5   Platelets 150 - 400 K/uL 346  349  303     Lab Results  Component Value Date   NA 136 05/09/2024   K 3.9 05/09/2024   CL 104 05/09/2024   CO2 22 05/09/2024      Assessment/Plan: Acute hypoxic respiratory failure  Improved-initially on 4-5 L of HFNC Now on room air  Has extensive bilateral infiltrates on CT imaging-unclear etiology-see below  Extensive bilateral lung infiltrates Has URI symptoms since April 2025-has completed several rounds of outpatient antibiotics (Zithromax  x 2, Doxy, most recently Levaquin).  Outpatient chest x-ray in Care everywhere x 2 were negative-these were done several weeks ago. She is on biologic agents for Crohn's disease for the past few years (recently switched from infliximab to [facility] in July 2025). Differentials are broad-atypical infections (LDH elevated-?  PJP), hypersensitive pneumonitis (workup pending)-drug-induced pneumonitis (numerous biologic agents)-ILD.  Autoimmune workup negative so far.  Since low suspicion for PJP at this point (PJP PCR sputum pending)-and due to mild bump in her creatinine-Will stop Bactrim  Remains on Rocephin /Zithromax /steroids PCCM pending bronchoscopy later today ID following.  History of stricturing Crohn's disease-s/p 2 small bowel resections Longstanding history of Crohn's disease-follows with GI at [facility] Previously on infliximab-switch to [employer] this past July-completed induction and was scheduled to start maintenance Skyrizi last week. Also on sulfasalazine (Per patient-she has previously been on adalimumab 2012- then ustekinumab 2019, and then infliximab plus azathioprine from 20 22-20 25-and then recently switched to [employer])  RLE ulcer S/p biopsy at [facility]-apparently consistent with resolving hematoma Supportive care at this point.  ?  COPD No  formal diagnosis-given her longstanding history of tobacco use-she was recently started on a rescue inhaler. For now continue with  bronchodilators.  HTN Nifedipine  Fibromyalgia/cervicalgia/chronic pain syndrome Neurontin /as needed oxycodone /as needed baclofen  Unfortunately per nursing staff-asking for IV Dilaudid -she is aware that on discharge she will be placed back on her usual dosing of oxycodone .  Mood disorder Seems stable Continue as needed Klonopin/Elavil /Adderall  GERD PPI  Code status:   Code Status: Full Code   DVT Prophylaxis:enoxaparin  (LOVENOX ) injection 40 mg Start: 05/06/24 1000    Family Communication: None at bedside   Disposition Plan: Status is: Inpatient Remains inpatient appropriate because: Severity of illness   Planned Discharge Destination:Home   Diet: Diet Order             Diet NPO time specified  Diet effective now                     Antimicrobial agents: Anti-infectives (From admission, onward)    Start     Dose/Rate Route Frequency Ordered Stop   05/08/24 1400  sulfamethoxazole -trimethoprim  (BACTRIM ) 320 mg of trimethoprim  in dextrose  5 % 500 mL IVPB  Status:  Discontinued        320 mg of trimethoprim  346.7 mL/hr over 90 Minutes Intravenous Every 8 hours 05/08/24 0934 05/09/24 0705   05/07/24 2200  azithromycin  (ZITHROMAX ) tablet 500 mg        500 mg Oral Daily 05/07/24 1328     05/07/24 2200  sulfamethoxazole -trimethoprim  (BACTRIM ) 340 mg of trimethoprim  in dextrose  5 % 500 mL IVPB  Status:  Discontinued        15 mg/kg/day of trimethoprim   68 kg 347.5 mL/hr over 90 Minutes Intravenous Every 8 hours 05/07/24 1700 05/07/24 1702   05/07/24 1815  sulfamethoxazole -trimethoprim  (BACTRIM ) 340 mg of trimethoprim  in dextrose  5 % 500 mL IVPB  Status:  Discontinued        15 mg/kg/day of trimethoprim   68 kg 347.5 mL/hr over 90 Minutes Intravenous Every 8 hours 05/07/24 1715 05/08/24 0934   05/06/24 2300  azithromycin  (ZITHROMAX ) 500 mg in sodium chloride  0.9 % 250 mL IVPB  Status:  Discontinued        500 mg 250 mL/hr over 60 Minutes Intravenous Every 24  hours 05/06/24 0212 05/07/24 1328   05/06/24 2300  cefTRIAXone  (ROCEPHIN ) 2 g in sodium chloride  0.9 % 100 mL IVPB        2 g 200 mL/hr over 30 Minutes Intravenous Every 24 hours 05/06/24 0212     05/06/24 1000  rifaximin (XIFAXAN) tablet 550 mg        550 mg Oral 3 times daily 05/06/24 0212     05/05/24 2315  cefTRIAXone  (ROCEPHIN ) 2 g in sodium chloride  0.9 % 100 mL IVPB        2 g 200 mL/hr over 30 Minutes Intravenous Once 05/05/24 2302 05/06/24 0004   05/05/24 2315  azithromycin  (ZITHROMAX ) 500 mg in sodium chloride  0.9 % 250 mL IVPB        500 mg 250 mL/hr over 60 Minutes Intravenous  Once 05/05/24 2302 05/06/24 0118        MEDICATIONS: Scheduled Meds:  amitriptyline   75 mg Oral QHS   amphetamine -dextroamphetamine   20 mg Oral BID   azithromycin   500 mg Oral Daily   budesonide  (PULMICORT ) nebulizer solution  0.25 mg Nebulization BID   enoxaparin  (LOVENOX ) injection  40 mg Subcutaneous Q24H   famotidine   40 mg Oral BID   folic acid   5 mg Oral Daily   gabapentin   800 mg Oral QID   guaiFENesin   600 mg Oral BID   loratadine  10 mg Oral Daily   methylPREDNISolone  (SOLU-MEDROL ) injection  120 mg Intravenous Q24H   multivitamin with minerals  1 tablet Oral Daily   NIFEdipine  30 mg Oral Daily   pantoprazole   40 mg Oral BID   polyethylene glycol  17 g Oral Daily   potassium chloride  SA  20 mEq Oral Daily   rifaximin  550 mg Oral TID   sulfaSALAzine  1,000 mg Oral Daily   Continuous Infusions:  cefTRIAXone  (ROCEPHIN )  IV 2 g (05/09/24 0041)   PRN Meds:.acetaminophen  **OR** acetaminophen , albuterol , ALPRAZolam , baclofen , clonazePAM, HYDROmorphone  (DILAUDID ) injection, naLOXone  (NARCAN )  injection, ondansetron  (ZOFRAN ) IV, oxyCODONE    I have personally reviewed following labs and imaging studies  LABORATORY DATA: CBC: Recent Labs  Lab 05/05/24 1930 05/06/24 0041 05/07/24 1334 05/08/24 0921 05/09/24 0252  WBC 17.3*  --  10.5 10.2 13.4*  NEUTROABS 15.1*  --  9.3*  --    --   HGB 13.5 11.6* 12.4 14.2 12.3  HCT 42.6 34.0* 37.5 42.6 36.2  MCV 112.1*  --  106.2* 105.7* 104.6*  PLT 235  --  303 349 346    Basic Metabolic Panel: Recent Labs  Lab 05/05/24 1930 05/06/24 0041 05/07/24 1334 05/08/24 0921 05/09/24 0252  NA 135 135 138 134* 136  K 3.3* 3.3* 4.1 4.1 3.9  CL 104  --  106 102 104  CO2 17*  --  19* 18* 22  GLUCOSE 110*  --  122* 117* 98  BUN 15  --  17 22* 24*  CREATININE 1.15*  --  0.86 0.93 1.12*  CALCIUM  9.1  --  8.8* 9.4 9.4    GFR: Estimated Creatinine Clearance: 60.5 mL/min (A) (by C-G formula based on SCr of 1.12 mg/dL (H)).  Liver Function Tests: Recent Labs  Lab 05/05/24 1930 05/07/24 1334 05/08/24 0921  AST 39 14* 23  ALT 22 18 29   ALKPHOS 175* 134* 153*  BILITOT 0.3 0.4 0.3  PROT 7.1 6.3* 6.6  ALBUMIN 2.9* 2.6* 2.8*   No results for input(s): LIPASE, AMYLASE in the last 168 hours. No results for input(s): AMMONIA in the last 168 hours.  Coagulation Profile: Recent Labs  Lab 05/05/24 1930  INR 1.0    Cardiac Enzymes: No results for input(s): CKTOTAL, CKMB, CKMBINDEX, TROPONINI in the last 168 hours.  BNP (last 3 results) No results for input(s): PROBNP in the last 8760 hours.  Lipid Profile: No results for input(s): CHOL, HDL, LDLCALC, TRIG, CHOLHDL, LDLDIRECT in the last 72 hours.  Thyroid Function Tests: No results for input(s): TSH, T4TOTAL, FREET4, T3FREE, THYROIDAB in the last 72 hours.  Anemia Panel: No results for input(s): VITAMINB12, FOLATE, FERRITIN, TIBC, IRON, RETICCTPCT in the last 72 hours.  Urine analysis:    Component Value Date/Time   COLORURINE YELLOW 04/30/2019 0950   APPEARANCEUR CLEAR 04/30/2019 0950   LABSPEC 1.012 04/30/2019 0950   PHURINE 7.0 04/30/2019 0950   GLUCOSEU NEGATIVE 04/30/2019 0950   HGBUR NEGATIVE 04/30/2019 0950   BILIRUBINUR NEGATIVE 04/30/2019 0950   KETONESUR NEGATIVE 04/30/2019 0950   PROTEINUR 30 (A)  04/30/2019 0950   UROBILINOGEN 0.2 10/22/2010 0038   NITRITE NEGATIVE 04/30/2019 0950   LEUKOCYTESUR NEGATIVE 04/30/2019 0950    Sepsis Labs: Lactic Acid, Venous    Component Value Date/Time   LATICACIDVEN 1.1 05/05/2024 1949    MICROBIOLOGY: Recent Results (  from the past 240 hours)  Culture, blood (Routine x 2)     Status: None (Preliminary result)   Collection Time: 05/05/24  7:30 PM   Specimen: BLOOD LEFT ARM  Result Value Ref Range Status   Specimen Description BLOOD LEFT ARM  Final   Special Requests   Final    BOTTLES DRAWN AEROBIC AND ANAEROBIC Blood Culture results may not be optimal due to an inadequate volume of blood received in culture bottles   Culture   Final    NO GROWTH 4 DAYS Performed at [facility], [address] [place], [place] [address]    Report Status PENDING  Incomplete  Culture, blood (Routine x 2)     Status: None (Preliminary result)   Collection Time: 05/05/24  7:35 PM   Specimen: BLOOD LEFT FOREARM  Result Value Ref Range Status   Specimen Description BLOOD LEFT FOREARM  Final   Special Requests   Final    BOTTLES DRAWN AEROBIC AND ANAEROBIC Blood Culture results may not be optimal due to an inadequate volume of blood received in culture bottles   Culture   Final    NO GROWTH 4 DAYS Performed at [facility], [address] [place], [place] [address]    Report Status PENDING  Incomplete  Resp panel by RT-PCR (RSV, Flu A&B, Covid) Anterior Nasal Swab     Status: None   Collection Time: 05/05/24  8:26 PM   Specimen: Anterior Nasal Swab  Result Value Ref Range Status   SARS Coronavirus 2 by RT PCR NEGATIVE NEGATIVE Final   Influenza A by PCR NEGATIVE NEGATIVE Final   Influenza B by PCR NEGATIVE NEGATIVE Final    Comment: (NOTE) The Xpert Xpress SARS-CoV-2/FLU/RSV plus assay is intended as an aid in the diagnosis of influenza from Nasopharyngeal swab specimens and should not be used as a sole basis for treatment. Nasal  washings and aspirates are unacceptable for Xpert Xpress SARS-CoV-2/FLU/RSV testing.  Fact Sheet for Patients: BloggerCourse.com  Fact Sheet for Healthcare Providers: SeriousBroker.it  This test is not yet approved or cleared by the [place]  FDA and has been authorized for detection and/or diagnosis of SARS-CoV-2 by FDA under an Emergency Use Authorization (EUA). This EUA will remain in effect (meaning this test can be used) for the duration of the COVID-19 declaration under Section 564(b)(1) of the Act, 21 U.S.C. section 360bbb-3(b)(1), unless the authorization is terminated or revoked.     Resp Syncytial Virus by PCR NEGATIVE NEGATIVE Final    Comment: (NOTE) Fact Sheet for Patients: BloggerCourse.com  Fact Sheet for Healthcare Providers: SeriousBroker.it  This test is not yet approved or cleared by the [place]  FDA and has been authorized for detection and/or diagnosis of SARS-CoV-2 by FDA under an Emergency Use Authorization (EUA). This EUA will remain in effect (meaning this test can be used) for the duration of the COVID-19 declaration under Section 564(b)(1) of the Act, 21 U.S.C. section 360bbb-3(b)(1), unless the authorization is terminated or revoked.  Performed at [facility], [address], [place], [place] [address]   Respiratory (~20 pathogens) panel by PCR     Status: None   Collection Time: 05/07/24  6:37 AM   Specimen: Nasopharyngeal Swab; Respiratory  Result Value Ref Range Status   Adenovirus NOT DETECTED NOT DETECTED Final   Coronavirus 229E NOT DETECTED NOT DETECTED Final    Comment: (NOTE) The Coronavirus on the Respiratory Panel, DOES NOT test for the novel  Coronavirus (2019 nCoV)    Coronavirus HKU1 NOT DETECTED NOT DETECTED Final   Coronavirus NL63 NOT DETECTED NOT DETECTED Final   Coronavirus OC43 NOT DETECTED NOT DETECTED  Final   Metapneumovirus NOT DETECTED NOT DETECTED Final   Rhinovirus / Enterovirus NOT DETECTED NOT DETECTED Final   Influenza A NOT DETECTED NOT DETECTED Final   Influenza B NOT DETECTED NOT DETECTED Final   Parainfluenza Virus 1 NOT DETECTED NOT DETECTED Final   Parainfluenza Virus 2 NOT DETECTED NOT DETECTED Final   Parainfluenza Virus 3 NOT DETECTED NOT DETECTED Final   Parainfluenza Virus 4 NOT DETECTED NOT DETECTED Final   Respiratory Syncytial Virus NOT DETECTED NOT DETECTED Final   Bordetella pertussis NOT DETECTED NOT DETECTED Final   Bordetella Parapertussis NOT DETECTED NOT DETECTED Final   Chlamydophila pneumoniae NOT DETECTED NOT DETECTED Final   Mycoplasma pneumoniae NOT DETECTED NOT DETECTED Final    Comment: Performed at [facility], [address], [place], [place] [address]  Expectorated Sputum Assessment w Gram Stain, Rflx to Resp Cult     Status: None   Collection Time: 05/08/24  6:48 AM   Specimen: Sputum  Result Value Ref Range Status   Specimen Description SPUTUM  Final   Special Requests NONE  Final   Sputum evaluation   Final    THIS SPECIMEN IS ACCEPTABLE FOR SPUTUM CULTURE Performed at [facility], [address] [place], [place] [address]    Report Status 05/08/2024 FINAL  Final  Culture, Respiratory w Gram Stain     Status: None (Preliminary result)   Collection Time: 05/08/24  6:48 AM   Specimen: SPU  Result Value Ref Range Status   Specimen Description SPUTUM  Final   Special Requests NONE Reflexed from [id number]  Final   Gram Stain   Final    FEW WBC PRESENT, PREDOMINANTLY PMN RARE YEAST Performed at [facility], [address] [place], [place] [address]    Culture PENDING  Incomplete   Report Status PENDING  Incomplete    RADIOLOGY STUDIES/RESULTS: No results found.    LOS: 4 days   Donalda Applebaum, MD  [facility]    To contact the attending provider between 7A-7P or the covering provider during after  hours 7P-7A, please log into the web site [email or web address] and access using universal [place] password for that web site. If you do not have the password, please call the hospital operator.  05/09/2024, 11:22 AM

## 2024-05-09 NOTE — Telephone Encounter (Signed)
 ERROR

## 2024-05-09 NOTE — Discharge Summary (Signed)
 PATIENT DETAILS Name: [name] Age: 43 y.o. Sex: female Date of Birth: Sep 07, 1980 MRN: [id number]. Admitting Physician: Editha Ram, MD ERE:[id number], Provider Not In  Admit Date: 05/05/2024 Discharge date: 05/09/2024  Note-patient left AGAINST MEDICAL ADVICE  Recommendations for Outpatient Follow-up:  Follow up with PCP in 1-2 weeks Please obtain CMP/CBC in one week Please follow-up sputum pneumocystis PCR, hypersensitivity panel, sputum cultures Please ensure follow-up with pulmonology  Admitted From:  Home  Disposition: Home   Discharge Condition: good  CODE STATUS:   Code Status: Full Code   Diet recommendation:  Diet Order             Diet regular Fluid consistency: Thin  Diet effective now           Diet - low sodium heart healthy                    Brief Summary: Patient is a 42 y.o.  female with history of Crohn's disease-recently completed induction of Skyrizi (previously on infliximab for years), ongoing tobacco use-who has had a URI (productive cough) since April of this year-completed several rounds of antibiotics (Zithromax /doxycycline/most recently Levaquin) presented with approximately 5-7-day history of worsening shortness of breath/chest pressure-patient was found to have acute hypoxic respiratory failure-in extensive bilateral pulmonary infiltrates.   Significant events: 9/29>> admit to [facility]-hypoxia-bilateral lung infiltrates.   Significant studies: 9/28>> CTA chest: No PE, patchy bilateral infiltrates.   Significant microbiology data: 9/28>> COVID PCR/influenza PCR/RSV PCR: Negative 9/28>> blood cultures: No growth 9/30>> respiratory virus panel:negative 10/01>> sputum PJP PCR: Pending   Procedures: None   Consults: PCCM ID  Brief Hospital Course: cute hypoxic respiratory failure  Improved-initially on 4-5 L of HFNC-Now on room air-empirically treated with Rocephin /Zithromax -briefly Bactrim  and then high-dose IV  steroids. Has extensive bilateral infiltrates on CT imaging-unclear etiology-see below   Extensive bilateral lung infiltrates Has URI symptoms since April 2025-has completed several rounds of outpatient antibiotics (Zithromax  x 2, Doxy, most recently Levaquin).  Outpatient chest x-ray in Care everywhere x 2 were negative-these were done several weeks ago. She is on biologic agents for Crohn's disease for the past few years (recently switched from infliximab to [facility] in July 2025). Differentials are broad-atypical infections (LDH elevated-?  PJP), hypersensitive pneumonitis (workup pending)-drug-induced pneumonitis (numerous biologic agents)-ILD.  Autoimmune workup negative so far.  She was followed closely by infectious disease and ID-although empirically started on Bactrim -felt to have low suspicion for PJP-Bactrim  was discontinued.  Plans were to proceed with bronchoscopy-patient was briefly kept n.p.o.-but due to scheduling issues-endoscopy was not able to be completed-unfortunately-patient has grown increasingly frustrated and has decided to leave AGAINST MEDICAL ADVICE.  She is aware of the life-threatening and life disabling risk of leaving against AMA. This MD  has spoken with PCCM MD-Dr Pawar-although not ideal-we will provider prednisone -see taper below on discharge along with antibiotics for a few more days.  Hopefully she will get to be seen by PCCM in the next few weeks before she completes PCCM taper.  She is aware that the PCCM office will call her with a follow-up appointment.  History of stricturing Crohn's disease-s/p 2 small bowel resections Longstanding history of Crohn's disease-follows with GI at [facility] Previously on infliximab-switch to [employer] this past July-completed induction and was scheduled to start maintenance Skyrizi last week. Also on sulfasalazine (Per patient-she has previously been on adalimumab 2012- then ustekinumab 2019, and then infliximab plus azathioprine from 20  22-20 25-and then recently switched to Skyrizi)  RLE ulcer S/p biopsy at [facility]-apparently consistent with resolving hematoma Supportive care at this point.   ?  COPD No formal diagnosis-given her longstanding history of tobacco use-she was recently started on a rescue inhaler. For now continue with bronchodilators.   HTN Nifedipine   Fibromyalgia/cervicalgia/chronic pain syndrome Neurontin /as needed oxycodone /as needed baclofen  Unfortunately per nursing staff-asking for IV Dilaudid -she is aware that on discharge she will be placed back on her usual dosing of oxycodone .   Mood disorder Seems stable Continue as needed Klonopin/Elavil /Adderall   GERD PPI   Discharge Diagnoses:  Principal Problem:   Multifocal pneumonia Active Problems:   Severe sepsis (HCC)   Acute hypoxemic respiratory failure (HCC)   COPD with acute exacerbation (HCC)   Chest pain   Discharge Instructions:  Activity:  As tolerated    Discharge Instructions     Call MD for:  difficulty breathing, headache or visual disturbances   Complete by: As directed    Diet - low sodium heart healthy   Complete by: As directed    Discharge instructions   Complete by: As directed    Follow with Primary MD  in 1-2 weeks  Follow-up with Dr. JONELLE Pawar-pulmonologist, his office will call you with a follow-up appointment-please see him before you complete your steroid taper-as the steroid taper may need to be adjusted.  Multiple lab work is still pending-please ask your primary care practitioner to review discharge summary and follow these labs.  Please get a complete blood count and chemistry panel checked by your Primary MD at your next visit, and again as instructed by your Primary MD.  Get Medicines reviewed and adjusted: Please take all your medications with you for your next visit with your Primary MD  Laboratory/radiological data: Please request your Primary MD to go over all hospital tests and  procedure/radiological results at the follow up, please ask your Primary MD to get all Hospital records sent to his/her office.  In some cases, they will be blood work, cultures and biopsy results pending at the time of your discharge. Please request that your primary care M.D. follows up on these results.  Also Note the following: If you experience worsening of your admission symptoms, develop shortness of breath, life threatening emergency, suicidal or homicidal thoughts you must seek medical attention immediately by calling 911 or calling your MD immediately  if symptoms less severe.  You must read complete instructions/literature along with all the possible adverse reactions/side effects for all the Medicines you take and that have been prescribed to you. Take any new Medicines after you have completely understood and accpet all the possible adverse reactions/side effects.   Do not drive when taking Pain medications or sleeping medications (Benzodaizepines)  Do not take more than prescribed Pain, Sleep and Anxiety Medications. It is not advisable to combine anxiety,sleep and pain medications without talking with your primary care practitioner  Special Instructions: If you have smoked or chewed Tobacco  in the last 2 yrs please stop smoking, stop any regular Alcohol  and or any Recreational drug use.  Wear Seat belts while driving.  Please note: You were cared for by a hospitalist during your hospital stay. Once you are discharged, your primary care physician will handle any further medical issues. Please note that NO REFILLS for any discharge medications will be authorized once you are discharged, as it is imperative that you return to your primary care physician (or establish a relationship with a primary care physician if you do not  have one) for your post hospital discharge needs so that they can reassess your need for medications and monitor your lab values.   Increase activity slowly    Complete by: As directed       Allergies as of 05/09/2024       Reactions   Penicillins Hives   Has patient had a PCN reaction causing immediate rash, facial/tongue/throat swelling, SOB or lightheadedness with hypotension: Yes Has patient had a PCN reaction causing severe rash involving mucus membranes or skin necrosis: Unk Has patient had a PCN reaction that required hospitalization: Yes Has patient had a PCN reaction occurring within the last 10 years: No If all of the above answers are NO, then may proceed with Cephalosporin use.        Medication List     STOP taking these medications    levofloxacin 500 MG tablet Commonly known as: LEVAQUIN       TAKE these medications    acetaminophen  650 MG CR tablet Commonly known as: TYLENOL  Take 1,300 mg by mouth in the morning and at bedtime.   ALPRAZolam  1 MG tablet Commonly known as: XANAX  Take 1 mg by mouth at bedtime as needed for anxiety or sleep.   amitriptyline  75 MG tablet Commonly known as: ELAVIL  Take 75 mg by mouth at bedtime.   amphetamine -dextroamphetamine  20 MG tablet Commonly known as: ADDERALL Take 20 mg by mouth 2 (two) times daily.   baclofen  20 MG tablet Commonly known as: LIORESAL  Take 20 mg by mouth 2 (two) times daily as needed for muscle spasms.   cefpodoxime 200 MG tablet Commonly known as: VANTIN Take 1 tablet (200 mg total) by mouth 2 (two) times daily for 3 days.   cetirizine 10 MG chewable tablet Commonly known as: ZYRTEC Chew 10 mg by mouth daily in the afternoon.   clonazePAM 1 MG disintegrating tablet Commonly known as: KLONOPIN Take 1 mg by mouth daily as needed (for panic attacks).   Cyanocobalamin  1000 MCG/ML Kit Inject as directed every 14 (fourteen) days.   esomeprazole  40 MG capsule Commonly known as: NEXIUM  Take 40 mg by mouth 2 (two) times daily before a meal.   famotidine  40 MG tablet Commonly known as: PEPCID  Take 40 mg by mouth 2 (two) times daily.   folic  acid 1 MG tablet Commonly known as: FOLVITE  Take 5 mg by mouth daily.   gabapentin  800 MG tablet Commonly known as: NEURONTIN  Take 1 tablet (800 mg total) by mouth 4 (four) times daily.   ibuprofen  800 MG tablet Commonly known as: ADVIL  Take 800 mg by mouth in the morning and at bedtime.   Magnesium  Glycinate 100 MG Caps Take 300 mg by mouth daily in the afternoon.   NIFEdipine 30 MG 24 hr tablet Commonly known as: PROCARDIA-XL/NIFEDICAL-XL Take 30 mg by mouth daily.   ondansetron  8 MG disintegrating tablet Commonly known as: ZOFRAN -ODT Take 8 mg by mouth every 8 (eight) hours as needed for nausea or vomiting.   oxyCODONE  15 MG immediate release tablet Commonly known as: ROXICODONE  Take 15 mg by mouth every 4 (four) hours as needed for moderate pain (pain score 4-6).   Phazyme Maximum Strength 250 MG Caps Generic drug: Simethicone  Take 250 mg by mouth daily as needed (for bloating).   potassium chloride  SA 20 MEQ tablet Commonly known as: KLOR-CON  M Take 20 mEq by mouth daily.   predniSONE  10 MG tablet Commonly known as: DELTASONE  Take 40 mg daily for 14 days, 30 mg  daily for 14 days, 20 mg daily for 14 days,10 mg daily for 7 days, then stop   rifaximin 550 MG Tabs tablet Commonly known as: XIFAXAN Take 550 mg by mouth 3 (three) times daily.   Skyrizi 360 MG/2.4ML Soct Generic drug: Risankizumab-rzaa Inject 360 mg into the skin every 28 (twenty-eight) days.   sulfaSALAzine 500 MG EC tablet Commonly known as: AZULFIDINE Take 500 mg by mouth 2 (two) times daily.   UNABLE TO FIND FERRITIN - infusion given every 14 days   VITAMIN K2-VITAMIN D3 PO Take 1 tablet by mouth daily in the afternoon.        Follow-up Information     Primary care practitioner. Schedule an appointment as soon as possible for a visit in 1 week(s).          Theodoro Lakes, MD Follow up.   Specialties: Pulmonary Disease, Internal Medicine, Sleep Medicine Why: Office will call with  date/time, If you dont hear from them,please give them a call, Hospital follow up Contact information: [address] [place] [address] [phone]                Allergies  Allergen Reactions   Penicillins Hives    Has patient had a PCN reaction causing immediate rash, facial/tongue/throat swelling, SOB or lightheadedness with hypotension: Yes Has patient had a PCN reaction causing severe rash involving mucus membranes or skin necrosis: Unk Has patient had a PCN reaction that required hospitalization: Yes Has patient had a PCN reaction occurring within the last 10 years: No If all of the above answers are NO, then may proceed with Cephalosporin use.        Other Procedures/Studies: CT Angio Chest PE W and/or Wo Contrast Result Date: 05/05/2024 CLINICAL DATA:  Clinical concern for pulmonary embolism. EXAM: CT ANGIOGRAPHY CHEST WITH CONTRAST TECHNIQUE: Multidetector CT imaging of the chest was performed using the standard protocol during bolus administration of intravenous contrast. Multiplanar CT image reconstructions and MIPs were obtained to evaluate the vascular anatomy. RADIATION DOSE REDUCTION: This exam was performed according to the departmental dose-optimization program which includes automated exposure control, adjustment of the mA and/or kV according to patient size and/or use of iterative reconstruction technique. CONTRAST:  75mL OMNIPAQUE  IOHEXOL  350 MG/ML SOLN COMPARISON:  None Available. FINDINGS: Cardiovascular: Satisfactory opacification of the pulmonary arteries to the segmental level. No evidence of pulmonary embolism. Normal heart size. No pericardial effusion. Mediastinum/Nodes: No enlarged mediastinal, hilar, or axillary lymph nodes. Thyroid gland, trachea, and esophagus demonstrate no significant findings. Lungs/Pleura: Marked severity patchy infiltrates are seen throughout both lungs. No pleural effusion or pneumothorax is identified. Upper  Abdomen: No acute abnormality. Musculoskeletal: No chest wall abnormality. No acute or significant osseous findings. Review of the MIP images confirms the above findings. IMPRESSION: 1. No evidence of pulmonary embolism. 2. Marked severity bilateral infiltrates, consistent with multifocal pneumonia. Electronically Signed   By: Suzen Dials M.D.   On: 05/05/2024 22:06   DG Chest Port 1 View if patient is in a treatment room. Result Date: 05/05/2024 CLINICAL DATA:  Short of breath, hypoxia EXAM: PORTABLE CHEST 1 VIEW COMPARISON:  01/09/2020 FINDINGS: Single frontal view of the chest demonstrates an enlarged cardiac silhouette. Diffuse bilateral interstitial and ground-glass opacities consistent with edema or widespread pneumonia. No effusion or pneumothorax. No acute bony abnormalities. IMPRESSION: 1. Diffuse interstitial and ground-glass opacities, which may reflect edema or widespread pneumonia, including atypical pneumonia such as viral etiologies. 2. Enlarged cardiac silhouette. Electronically Signed  By: Ozell Daring M.D.   On: 05/05/2024 19:59     TODAY-DAY OF DISCHARGE:  Subjective:   Valeri Sula today has no headache,no chest abdominal pain,no new weakness tingling or numbness, feels much better wants to go home today.   Objective:   Blood pressure 112/87, pulse 76, temperature 99 F (37.2 C), temperature source Oral, resp. rate 18, height 5' 3 (1.6 m), weight 68 kg, last menstrual period 04/12/2024, SpO2 94%.  Intake/Output Summary (Last 24 hours) at 05/09/2024 1352 Last data filed at 05/09/2024 0900 Gross per 24 hour  Intake 100 ml  Output --  Net 100 ml   Filed Weights   05/05/24 1917  Weight: 68 kg    Exam: Awake Alert, Oriented *3, No new F.N deficits, Normal affect Tiro.AT,PERRAL Supple Neck,No JVD, No cervical lymphadenopathy appriciated.  Symmetrical Chest wall movement, Good air movement bilaterally, CTAB RRR,No Gallops,Rubs or new Murmurs, No Parasternal  Heave +ve B.Sounds, Abd Soft, Non tender, No organomegaly appriciated, No rebound -guarding or rigidity. No Cyanosis, Clubbing or edema, No new Rash or bruise   PERTINENT RADIOLOGIC STUDIES: No results found.   PERTINENT LAB RESULTS: CBC: Recent Labs    05/08/24 0921 05/09/24 0252  WBC 10.2 13.4*  HGB 14.2 12.3  HCT 42.6 36.2  PLT 349 346   CMET CMP     Component Value Date/Time   NA 136 05/09/2024 0252   K 3.9 05/09/2024 0252   CL 104 05/09/2024 0252   CO2 22 05/09/2024 0252   GLUCOSE 98 05/09/2024 0252   BUN 24 (H) 05/09/2024 0252   CREATININE 1.12 (H) 05/09/2024 0252   CREATININE 0.87 11/05/2013 1244   CALCIUM  9.4 05/09/2024 0252   CALCIUM  (LL) 10/22/2010 0448    5.9 QNS FOR REPEAT CRITICAL RESULT CALLED TO, READ BACK BY AND VERIFIED WITH: MARTIN,M. RN AT 1235 ON 10/25/10 BY GILLESPIE,B.   PROT 6.6 05/08/2024 0921   ALBUMIN 2.8 (L) 05/08/2024 0921   AST 23 05/08/2024 0921   ALT 29 05/08/2024 0921   ALKPHOS 153 (H) 05/08/2024 0921   BILITOT 0.3 05/08/2024 0921   GFRNONAA >60 05/09/2024 0252   GFRNONAA 88 11/05/2013 1244    GFR Estimated Creatinine Clearance: 60.5 mL/min (A) (by C-G formula based on SCr of 1.12 mg/dL (H)). No results for input(s): LIPASE, AMYLASE in the last 72 hours. No results for input(s): CKTOTAL, CKMB, CKMBINDEX, TROPONINI in the last 72 hours. Invalid input(s): POCBNP No results for input(s): DDIMER in the last 72 hours. No results for input(s): HGBA1C in the last 72 hours. No results for input(s): CHOL, HDL, LDLCALC, TRIG, CHOLHDL, LDLDIRECT in the last 72 hours. No results for input(s): TSH, T4TOTAL, T3FREE, THYROIDAB in the last 72 hours.  Invalid input(s): FREET3 No results for input(s): VITAMINB12, FOLATE, FERRITIN, TIBC, IRON, RETICCTPCT in the last 72 hours. Coags: No results for input(s): INR in the last 72 hours.  Invalid input(s): PT Microbiology: Recent Results  (from the past 240 hours)  Culture, blood (Routine x 2)     Status: None (Preliminary result)   Collection Time: 05/05/24  7:30 PM   Specimen: BLOOD LEFT ARM  Result Value Ref Range Status   Specimen Description BLOOD LEFT ARM  Final   Special Requests   Final    BOTTLES DRAWN AEROBIC AND ANAEROBIC Blood Culture results may not be optimal due to an inadequate volume of blood received in culture bottles   Culture   Final    NO GROWTH 4 DAYS Performed  at [facility], [address], [place], [place] [address]    Report Status PENDING  Incomplete  Culture, blood (Routine x 2)     Status: None (Preliminary result)   Collection Time: 05/05/24  7:35 PM   Specimen: BLOOD LEFT FOREARM  Result Value Ref Range Status   Specimen Description BLOOD LEFT FOREARM  Final   Special Requests   Final    BOTTLES DRAWN AEROBIC AND ANAEROBIC Blood Culture results may not be optimal due to an inadequate volume of blood received in culture bottles   Culture   Final    NO GROWTH 4 DAYS Performed at [facility], [address], [place], [place] [address]    Report Status PENDING  Incomplete  Resp panel by RT-PCR (RSV, Flu A&B, Covid) Anterior Nasal Swab     Status: None   Collection Time: 05/05/24  8:26 PM   Specimen: Anterior Nasal Swab  Result Value Ref Range Status   SARS Coronavirus 2 by RT PCR NEGATIVE NEGATIVE Final   Influenza A by PCR NEGATIVE NEGATIVE Final   Influenza B by PCR NEGATIVE NEGATIVE Final    Comment: (NOTE) The Xpert Xpress SARS-CoV-2/FLU/RSV plus assay is intended as an aid in the diagnosis of influenza from Nasopharyngeal swab specimens and should not be used as a sole basis for treatment. Nasal washings and aspirates are unacceptable for Xpert Xpress SARS-CoV-2/FLU/RSV testing.  Fact Sheet for Patients: BloggerCourse.com  Fact Sheet for Healthcare Providers: SeriousBroker.it  This test is not yet approved or  cleared by the [place]  FDA and has been authorized for detection and/or diagnosis of SARS-CoV-2 by FDA under an Emergency Use Authorization (EUA). This EUA will remain in effect (meaning this test can be used) for the duration of the COVID-19 declaration under Section 564(b)(1) of the Act, 21 U.S.C. section 360bbb-3(b)(1), unless the authorization is terminated or revoked.     Resp Syncytial Virus by PCR NEGATIVE NEGATIVE Final    Comment: (NOTE) Fact Sheet for Patients: BloggerCourse.com  Fact Sheet for Healthcare Providers: SeriousBroker.it  This test is not yet approved or cleared by the [place]  FDA and has been authorized for detection and/or diagnosis of SARS-CoV-2 by FDA under an Emergency Use Authorization (EUA). This EUA will remain in effect (meaning this test can be used) for the duration of the COVID-19 declaration under Section 564(b)(1) of the Act, 21 U.S.C. section 360bbb-3(b)(1), unless the authorization is terminated or revoked.  Performed at [facility], [address] [place], [place] [address]   Respiratory (~20 pathogens) panel by PCR     Status: None   Collection Time: 05/07/24  6:37 AM   Specimen: Nasopharyngeal Swab; Respiratory  Result Value Ref Range Status   Adenovirus NOT DETECTED NOT DETECTED Final   Coronavirus 229E NOT DETECTED NOT DETECTED Final    Comment: (NOTE) The Coronavirus on the Respiratory Panel, DOES NOT test for the novel  Coronavirus (2019 nCoV)    Coronavirus HKU1 NOT DETECTED NOT DETECTED Final   Coronavirus NL63 NOT DETECTED NOT DETECTED Final   Coronavirus OC43 NOT DETECTED NOT DETECTED Final   Metapneumovirus NOT DETECTED NOT DETECTED Final   Rhinovirus / Enterovirus NOT DETECTED NOT DETECTED Final   Influenza A NOT DETECTED NOT DETECTED Final   Influenza B NOT DETECTED NOT DETECTED Final   Parainfluenza Virus 1 NOT DETECTED NOT DETECTED Final    Parainfluenza Virus 2 NOT DETECTED NOT DETECTED Final   Parainfluenza Virus 3 NOT  DETECTED NOT DETECTED Final   Parainfluenza Virus 4 NOT DETECTED NOT DETECTED Final   Respiratory Syncytial Virus NOT DETECTED NOT DETECTED Final   Bordetella pertussis NOT DETECTED NOT DETECTED Final   Bordetella Parapertussis NOT DETECTED NOT DETECTED Final   Chlamydophila pneumoniae NOT DETECTED NOT DETECTED Final   Mycoplasma pneumoniae NOT DETECTED NOT DETECTED Final    Comment: Performed at [facility], [address], [place], [place] [address]  Expectorated Sputum Assessment w Gram Stain, Rflx to Resp Cult     Status: None   Collection Time: 05/08/24  6:48 AM   Specimen: Sputum  Result Value Ref Range Status   Specimen Description SPUTUM  Final   Special Requests NONE  Final   Sputum evaluation   Final    THIS SPECIMEN IS ACCEPTABLE FOR SPUTUM CULTURE Performed at [facility], [address], [place], [place] [address]    Report Status 05/08/2024 FINAL  Final  Culture, Respiratory w Gram Stain     Status: None (Preliminary result)   Collection Time: 05/08/24  6:48 AM   Specimen: SPU  Result Value Ref Range Status   Specimen Description SPUTUM  Final   Special Requests NONE Reflexed from [id number]  Final   Gram Stain FEW WBC PRESENT, PREDOMINANTLY PMN RARE YEAST   Final   Culture   Final    CULTURE REINCUBATED FOR BETTER GROWTH Performed at [facility], [address] [place], [place] [address]    Report Status PENDING  Incomplete    FURTHER DISCHARGE INSTRUCTIONS:  Get Medicines reviewed and adjusted: Please take all your medications with you for your next visit with your Primary MD  Laboratory/radiological data: Please request your Primary MD to go over all hospital tests and procedure/radiological results at the follow up, please ask your Primary MD to get all Hospital records sent to his/her office.  In some cases, they will be blood work, cultures and biopsy  results pending at the time of your discharge. Please request that your primary care M.D. goes through all the records of your hospital data and follows up on these results.  Also Note the following: If you experience worsening of your admission symptoms, develop shortness of breath, life threatening emergency, suicidal or homicidal thoughts you must seek medical attention immediately by calling 911 or calling your MD immediately  if symptoms less severe.  You must read complete instructions/literature along with all the possible adverse reactions/side effects for all the Medicines you take and that have been prescribed to you. Take any new Medicines after you have completely understood and accpet all the possible adverse reactions/side effects.   Do not drive when taking Pain medications or sleeping medications (Benzodaizepines)  Do not take more than prescribed Pain, Sleep and Anxiety Medications. It is not advisable to combine anxiety,sleep and pain medications without talking with your primary care practitioner  Special Instructions: If you have smoked or chewed Tobacco  in the last 2 yrs please stop smoking, stop any regular Alcohol  and or any Recreational drug use.  Wear Seat belts while driving.  Please note: You were cared for by a hospitalist during your hospital stay. Once you are discharged, your primary care physician will handle any further medical issues. Please note that NO REFILLS for any discharge medications will be authorized once you are discharged, as it is imperative that you return to your primary care physician (or establish a relationship with a primary care physician if you  do not have one) for your post hospital discharge needs so that they can reassess your need for medications and monitor your lab values.  Total Time spent coordinating discharge including counseling, education and face to face time equals greater than 30 minutes.  Signed: Logyn Dedominicis 05/09/2024 1:52 PM

## 2024-05-10 LAB — CULTURE, BLOOD (ROUTINE X 2)
Culture: NO GROWTH
Culture: NO GROWTH

## 2024-05-10 LAB — FUNGITELL BETA-D-GLUCAN: Fungitell Value:: 88.499 pg/mL

## 2024-05-10 LAB — CULTURE, RESPIRATORY W GRAM STAIN

## 2024-05-10 NOTE — Unmapped (Signed)
 Copied from CRM #[id number]. Topic: Scheduling - New Appointment  >> May 10, 2024 11:37 AM Jasmine R wrote:  Caller asked to schedule a new appointment. Needing hospital follow up appt asap. Declined to see anyone other than pcp.      Additional Requests/Needs: Yes Appointment Related: Hospital Admission appointment the first available is > 14 days out. Patient was discharged on 10/2. They have been scheduled for the first available appointment.  The patient preferred contact: Cell Phone Telephone Information:  Mobile          [phone]   Urgent callback turnaround time: within 24 business hours. Programmer, systems Notified)

## 2024-05-13 LAB — HYPERSENSITIVITY PNEUMONITIS
A. Pullulans Abs: NEGATIVE
A.Fumigatus #1 Abs: NEGATIVE
Micropolyspora faeni, IgG: NEGATIVE
Pigeon Serum Abs: NEGATIVE
Thermoact. Saccharii: NEGATIVE
Thermoactinomyces vulgaris, IgG: NEGATIVE

## 2024-05-13 NOTE — Unmapped (Unsigned)
 Assessment/Plan:    There are no diagnoses linked to this encounter.    Hospital follow up visit  Acute hypoxic respiratory failure in the setting of pneumonia-  Leukocytosis with macrocytosis- repeat CBC with diff today  Possible COPD in the setting of Tobacco use disorder- pt has scheduled pulmonary appt next month  Elevated alk phos level- repeat CMP today  History of Crohn's disease    No follow-ups on file.    Subjective:     HPI  The pt is seen today for hospital follow up visit. She was admitted to [facility] on 05/05/2024 and discharged on 05/09/2024 for acute hypoxic respiratory failure in the setting of pneumonia. Hospitalization details as forwarded from discharge summary are as follows:    Brief Summary:  Patient is a 43 y.o. female with history of Crohn's disease-recently completed induction of Skyrizi  (previously on infliximab for years), ongoing tobacco use-who has had a URI (productive cough) since April of this year-completed several rounds of antibiotics (Zithromax /doxycycline /most recently Levaquin ) presented with approximately 5-7-day history of worsening shortness of breath/chest pressure-patient was found to have acute hypoxic respiratory failure-in extensive bilateral pulmonary infiltrates.  Significant events:  9/29>> admit to [facility]-hypoxia-bilateral lung infiltrates.  Significant studies:  9/28>> CTA chest: No PE, patchy bilateral infiltrates.  Significant microbiology data:  9/28>> COVID PCR/influenza PCR/RSV PCR: Negative  9/28>> blood cultures: No growth  9/30>> respiratory virus panel: Pending  Assessment/Plan:  Acute hypoxic respiratory failure   Somewhat improved-around 4-6 L of HFNC  Has extensive bilateral infiltrates on CT imaging-unclear etiology-see below  In interim-continue steroid/empiric antibiotics-volume status is stable-do not think she requires diuretics at this point.    Extensive bilateral lung infiltrates  Has URI symptoms since April 2025-has completed several rounds of outpatient antibiotics (Zithromax  x 2, Doxy, most recently Levaquin ). Outpatient chest x-ray in Care everywhere x 2 were negative-these were done several weeks ago.  She is on biologic agents for Crohn's disease for the past few years (recently switched from infliximab to Skyrizi  in July 2025).  Differentials are broad-atypical infections, hypersensitive pneumonitis, ILD/inflammation are all possible. (Per review of care everywhere-ANA neg February 2025, anti-MPO/anti-PR3 antibody - March 2025)  Check CRP-respiratory virus panel-BNP  For now continue with Solu-Medrol /empiric antibiotics  Will get PCCM opinion.  History of stricturing Crohn's disease-s/p 2 small bowel resections  Longstanding history of Crohn's disease-follows with GI at [facility]  Previously on infliximab-switch to Skyrizi  this past July-completed induction and was scheduled to start maintenance Skyrizi  last week.  Also on sulfasalazine  (Per patient-she has previously been on adalimumab 2012- then ustekinumab  2019, and then infliximab plus azathioprine  from 20 22-20 25-and then recently switched to Skyrizi )  RLE ulcer  S/p biopsy at [facility]-apparently consistent with resolving hematoma  Supportive care at this point.  ? COPD  No formal diagnosis-given her longstanding history of tobacco use-she was recently started on a rescue inhaler.  For now continue with bronchodilators.  HTN  Nifedipine   Fibromyalgia/cervicalgia  Neurontin/as needed oxycodone /as needed baclofen   Mood disorder  Seems stable  Continue as needed Klonopin /Elavil/Adderall   GERD  PPI   Recommendations for Outpatient Follow-up:   Follow up with PCP in 1-2 weeks  Please obtain CMP/CBC in one week  Please follow-up sputum pneumocystis PCR, hypersensitivity panel, sputum cultures  Please ensure follow-up with pulmonology       The pt has a scheduled pulmonary follow up visit on 06/19/2024.CBC on day of discharge revealed WBC count of 13 K with MCV of 104 and  nl H/H and plt count. CMP revealed CO2 of 18, alk phos of 153 and sodium of 134/otherwise normal. Expectorated sputum with gram stain was obtained on 05/08/2024. Comprehensive respiratory panel is negative.            ROS  Constitutional:  Denies  unexpected weight loss or gain, or weakness   Eyes:  Denies visual changes  Respiratory: history as noted above  Cardiovascular:  Denies chest pain, palpitations or lower extremity swelling   GI:  Denies abdominal pain, diarrhea, constipation   Musculoskeletal:  Denies myalgias  Skin:  Denies nonhealing lesions  Neurologic:  Denies headache, focal weakness or numbness, tingling  Endocrine:  Denies polyuria or polydypsia   Psychiatric:  Denies depression, anxiety      Medications Prior to Visit[1]      Objective:       Vital Signs  There were no vitals taken for this visit.     Exam  General: normal appearance  EYES: Anicteric sclerae.  ENT: Oropharynx moist.  RESP: Relaxed respiratory effort. Clear to auscultation without wheezes or crackles.   CV: Regular rate and rhythm. Normal S1 and S2. No murmurs or gallops.  No lower extremity edema. Posterior tibial pulses are 2+ and symmetric.  abd exam: non tender, no masses, no HSM   MSK: No focal muscle tenderness.  SKIN: Appropriately warm and moist.  NEURO: Stable gait and coordination.    Allergies:     Doxycycline , Penicillins, and Doxycycline  hcl    Current Medications:     Current Medications[2]        Note - This record has been created using [employer]. Chart creation errors have been sought, but may not always have been located. Such creation errors do not reflect on the standard of medical care.    Dajon Lazar, MD       [1]   Outpatient Medications Prior to Visit   Medication Sig Dispense Refill    acetaminophen  (TYLENOL ) 500 MG tablet Take 2 tablets (1,000 mg total) by mouth every six (6) hours as needed for pain.      albuterol  HFA 90 mcg/actuation inhaler Inhale 2 puffs every 4-6 hrs with spacer for wheezing 6.7 g 0    amitriptyline (ELAVIL) 75 MG tablet Take 1 tablet (75 mg total) by mouth.      aripiprazole (ABILIFY) 2 MG tablet Take 1 tablet (2 mg total) by mouth every morning.      aspirin -acetaminophen -caffeine  (EXCEDRIN  MIGRAINE) 250-250-65 mg per tablet Take 1 tablet by mouth.      azelaic acid  15 % gel After skin is thoroughly washed and patted dry, gently but thoroughly massage a thin film of azelaic acid  gel into the face daily 50 g 5    baclofen  (LIORESAL ) 20 MG tablet Take 1 tablet (20 mg total) by mouth four (4) times a day.      cetirizine (ZYRTEC) 10 MG tablet       chlorhexidine (PERIDEX) 0.12 % solution       cholecalciferol , vitD3,/vit K2 (VITAMIN D3-VITAMIN K2) 250 mcg (10,000 unit)-45 mcg cap Take by mouth. 100 mcg K2      clonazePAM  (KLONOPIN ) 1 MG disintegrating tablet PLEASE SEE ATTACHED FOR DETAILED DIRECTIONS      cyanocobalamin , vitamin B-12, 1,000 mcg/mL injection Inject 1 mL (1,000 mcg total) under the skin once for 1 dose. 1 mL 0    cycloSPORINE  (RESTASIS ) 0.05 % ophthalmic emulsion Administer 1 drop to both eyes Two (2) times a day.  180 each 3    dextroamphetamine -amphetamine  (ADDERALL ) 20 mg tablet Take 1 tablet (20 mg total) by mouth two (2) times a day.      doxycycline  (ADOXA) 100 MG tablet Take 1 tablet (100 mg total) by mouth two (2) times a day.      empty container Misc Use as directed. 1 each 2    esomeprazole  (NEXIUM ) 40 MG capsule Take 1 capsule (40 mg total) by mouth two (2) times a day. 60 capsule 5    famotidine  (PEPCID ) 40 MG tablet Take 1 tablet (40 mg total) by mouth two (2) times a day. 90 tablet 3    folic acid  (FOLVITE ) 1 MG tablet Take 5 tablets (5 mg total) by mouth daily. 450 tablet 3    gabapentin (NEURONTIN) 800 MG tablet Take 1 tablet (800 mg total) by mouth Four (4) times a day with a meal and nightly.      ibuprofen (MOTRIN) 800 MG tablet       inhalational spacing device (AEROCHAMBER MV) Spcr Use with inhaler 1 each 0    ipratropium-albuterol  (DUO-NEB) 0.5-2.5 mg/3 mL nebulizer Inhale 3 mL by nebulization every eight (8) hours as needed. 75 mL 1    lidocaine  (ELA-MAX) 5 % cream Apply to affected area as needed for itch or pain twice daily to affected area on legs 30 g 5    magnesium  glycinate 100 mg magnesium  cap       mupirocin  (BACTROBAN ) 2 % ointment Apply to affected area of the skin up to three times per day as needed. 30 g 5    naloxone  (NARCAN ) 4 mg nasal spray       NIFEdipine  (PROCARDIA  XL) 30 MG 24 hr tablet Take 1 tablet (30 mg total) by mouth daily. 90 tablet 0    ondansetron  (ZOFRAN -ODT) 8 MG disintegrating tablet Dissolve 1 tablet (8 mg total) on the tongue every eight (8) hours as needed for nausea. 60 tablet 4    oxyCODONE  (ROXICODONE ) 15 MG immediate release tablet       potassium chloride  (KLOR-CON  M20) 20 MEQ ER tablet Take 1 tablet (20 mEq total) by mouth daily. 30 tablet 5    promethazine-dextromethorphan (PROMETHAZINE-DM) 6.25-15 mg/5 mL syrup Take 5 mL by mouth.      risankizumab -rzaa (SKYRIZI ) 360 mg/2.4 mL (150 mg/mL) Injt Inject 360 mg with OBI at week 12 post completion of IV loading doses, then every 8 weeks for maintenance. 2.4 mL 0    simethicone  (MYLICON) 80 MG chewable tablet Chew 1 tablet (80 mg total) every six (6) hours as needed for flatulence. 30 tablet 0    simethicone  (PHAZYME) 500 mg cap       sulfaSALAzine 500 MG EC tablet Take 2 tablets (1,000 mg total) by mouth.      syringe with needle (BD LUER-LOK SYRINGE) 3 mL 25 x 5/8 Syrg Use one every fourteen (14) days. 50 each 2    tacrolimus  (PROTOPIC ) 0.03 % ointment Apply two times a day on weekdays to areas of hair thinning/loss on the eyebrows (Patient not taking: Reported on 04/18/2024) 100 g 1    tretinoin  (RETIN-A ) 0.05 % cream Apply 2-3 times weekly at night to dry skin after cleaning. Increase frequency up to nightly as tolerated. 20 g 5    triamcinolone  (KENALOG ) 0.1 % ointment Apply two times a day on weekends to areas of hair thinning/loss on the eyebrows and two times a day as needed to active areas of eczema until skin  is smooth. 80 g 1    varenicline tartrate (CHANTIX PAK) 0.5 mg (11)- 1 mg (42) tablet FOLLOW STARTER PACK INSTRUCTIONS FOR SMOKING CESSATION      VENTOLIN  HFA 90 mcg/actuation inhaler Inhale 2 puffs every six (6) hours as needed for wheezing. 18 g 0     No facility-administered medications prior to visit.   [2]   Current Outpatient Medications   Medication Sig Dispense Refill    acetaminophen  (TYLENOL ) 500 MG tablet Take 2 tablets (1,000 mg total) by mouth every six (6) hours as needed for pain.      albuterol  HFA 90 mcg/actuation inhaler Inhale 2 puffs every 4-6 hrs with spacer for wheezing 6.7 g 0    amitriptyline (ELAVIL) 75 MG tablet Take 1 tablet (75 mg total) by mouth.      aripiprazole (ABILIFY) 2 MG tablet Take 1 tablet (2 mg total) by mouth every morning.      aspirin -acetaminophen -caffeine  (EXCEDRIN  MIGRAINE) 250-250-65 mg per tablet Take 1 tablet by mouth.      azelaic acid  15 % gel After skin is thoroughly washed and patted dry, gently but thoroughly massage a thin film of azelaic acid  gel into the face daily 50 g 5    baclofen  (LIORESAL ) 20 MG tablet Take 1 tablet (20 mg total) by mouth four (4) times a day.      cetirizine (ZYRTEC) 10 MG tablet       chlorhexidine (PERIDEX) 0.12 % solution       cholecalciferol , vitD3,/vit K2 (VITAMIN D3-VITAMIN K2) 250 mcg (10,000 unit)-45 mcg cap Take by mouth. 100 mcg K2      clonazePAM  (KLONOPIN ) 1 MG disintegrating tablet PLEASE SEE ATTACHED FOR DETAILED DIRECTIONS      cyanocobalamin , vitamin B-12, 1,000 mcg/mL injection Inject 1 mL (1,000 mcg total) under the skin once for 1 dose. 1 mL 0    cycloSPORINE  (RESTASIS ) 0.05 % ophthalmic emulsion Administer 1 drop to both eyes Two (2) times a day. 180 each 3    dextroamphetamine -amphetamine  (ADDERALL ) 20 mg tablet Take 1 tablet (20 mg total) by mouth two (2) times a day.      doxycycline  (ADOXA) 100 MG tablet Take 1 tablet (100 mg total) by mouth two (2) times a day.      empty container Misc Use as directed. 1 each 2    esomeprazole  (NEXIUM ) 40 MG capsule Take 1 capsule (40 mg total) by mouth two (2) times a day. 60 capsule 5    famotidine  (PEPCID ) 40 MG tablet Take 1 tablet (40 mg total) by mouth two (2) times a day. 90 tablet 3    folic acid  (FOLVITE ) 1 MG tablet Take 5 tablets (5 mg total) by mouth daily. 450 tablet 3    gabapentin (NEURONTIN) 800 MG tablet Take 1 tablet (800 mg total) by mouth Four (4) times a day with a meal and nightly.      ibuprofen (MOTRIN) 800 MG tablet       inhalational spacing device (AEROCHAMBER MV) Spcr Use with inhaler 1 each 0    ipratropium-albuterol  (DUO-NEB) 0.5-2.5 mg/3 mL nebulizer Inhale 3 mL by nebulization every eight (8) hours as needed. 75 mL 1    lidocaine  (ELA-MAX) 5 % cream Apply to affected area as needed for itch or pain twice daily to affected area on legs 30 g 5    magnesium  glycinate 100 mg magnesium  cap       mupirocin  (BACTROBAN ) 2 % ointment Apply to affected area of  the skin up to three times per day as needed. 30 g 5    naloxone  (NARCAN ) 4 mg nasal spray       NIFEdipine  (PROCARDIA  XL) 30 MG 24 hr tablet Take 1 tablet (30 mg total) by mouth daily. 90 tablet 0    ondansetron  (ZOFRAN -ODT) 8 MG disintegrating tablet Dissolve 1 tablet (8 mg total) on the tongue every eight (8) hours as needed for nausea. 60 tablet 4    oxyCODONE  (ROXICODONE ) 15 MG immediate release tablet       potassium chloride  (KLOR-CON  M20) 20 MEQ ER tablet Take 1 tablet (20 mEq total) by mouth daily. 30 tablet 5    promethazine-dextromethorphan (PROMETHAZINE-DM) 6.25-15 mg/5 mL syrup Take 5 mL by mouth.      risankizumab -rzaa (SKYRIZI ) 360 mg/2.4 mL (150 mg/mL) Injt Inject 360 mg with OBI at week 12 post completion of IV loading doses, then every 8 weeks for maintenance. 2.4 mL 0    simethicone  (MYLICON) 80 MG chewable tablet Chew 1 tablet (80 mg total) every six (6) hours as needed for flatulence. 30 tablet 0    simethicone  (PHAZYME) 500 mg cap       sulfaSALAzine 500 MG EC tablet Take 2 tablets (1,000 mg total) by mouth.      syringe with needle (BD LUER-LOK SYRINGE) 3 mL 25 x 5/8 Syrg Use one every fourteen (14) days. 50 each 2    tacrolimus  (PROTOPIC ) 0.03 % ointment Apply two times a day on weekdays to areas of hair thinning/loss on the eyebrows (Patient not taking: Reported on 04/18/2024) 100 g 1    tretinoin  (RETIN-A ) 0.05 % cream Apply 2-3 times weekly at night to dry skin after cleaning. Increase frequency up to nightly as tolerated. 20 g 5    triamcinolone  (KENALOG ) 0.1 % ointment Apply two times a day on weekends to areas of hair thinning/loss on the eyebrows and two times a day as needed to active areas of eczema until skin is smooth. 80 g 1    varenicline tartrate (CHANTIX PAK) 0.5 mg (11)- 1 mg (42) tablet FOLLOW STARTER PACK INSTRUCTIONS FOR SMOKING CESSATION      VENTOLIN  HFA 90 mcg/actuation inhaler Inhale 2 puffs every six (6) hours as needed for wheezing. 18 g 0     No current facility-administered medications for this visit.

## 2024-05-13 NOTE — Telephone Encounter (Signed)
 Dr. Theodoro your next opening is what the pt is scheduled for 11/12.  Would you like to double book yourself for sooner 30 min appt date?  Please advise.

## 2024-05-14 ENCOUNTER — Telehealth: Payer: Self-pay

## 2024-05-14 NOTE — Telephone Encounter (Signed)
 Copied from CRM #[id number]. Topic: General - Other >> May 13, 2024  4:47 PM Rilla B wrote: Reason for CRM: Patient calling to speak with [facility].  She is not available and said to tell patient she is still waiting for an answer from Dr Theodoro and as soon as she has the answer she will call her. >> May 14, 2024  4:01 PM Benton O wrote: Patient is calling back because she is wanting to speak with nurse noelle concerning getting her a sooner appointment . Relayed to patient the message from yesterday and she said this is important and she doesn't know if she has received a call or not . Patient is requesting a call back . I called cal and mr [facility] picked up to put me hold . I just told the patient that the office is busy right now and they will give her call back  [phone]

## 2024-05-14 NOTE — Telephone Encounter (Signed)
  Dr. Theodoro your next opening is what the pt is scheduled for 11/12.  Would you like to double book yourself for sooner 30 min appt date?  Please advise.     please advise and call back [phone].

## 2024-05-15 NOTE — Unmapped (Signed)
 Staff spoke with patient about scheduling a therapy visit with Connell Jobs, LCSW at [facility].  Patient reports that she has transitioned to a new psychiatric provider in [place] and no longer able to do therapy at [facility].

## 2024-05-15 NOTE — Telephone Encounter (Signed)
 Has been scheduled for 05/20/2024 at 4:15

## 2024-05-15 NOTE — Telephone Encounter (Signed)
 Duplicate. See 10/2 encounter.

## 2024-05-15 NOTE — Telephone Encounter (Signed)
 Called and spoke with the pt. Pt states she would like to schedule appt 10/13 @4 :15   [facility] or [facility] can you schedule this please? Pt is aware of appointment.

## 2024-05-16 LAB — ALPHA GALACTOSIDASE: Alpha galactosidase, serum: 166.3 nmol/h/mg{prot} (ref >35.5)

## 2024-05-16 NOTE — Telephone Encounter (Signed)
 Merged message patient appt scheduled 10/13

## 2024-05-20 ENCOUNTER — Inpatient Hospital Stay

## 2024-05-21 ENCOUNTER — Ambulatory Visit

## 2024-05-21 ENCOUNTER — Ambulatory Visit: Payer: Self-pay

## 2024-05-21 ENCOUNTER — Ambulatory Visit (INDEPENDENT_AMBULATORY_CARE_PROVIDER_SITE_OTHER)

## 2024-05-21 VITALS — BP 121/83 | HR 71 | Temp 98.7°F | Ht 63.0 in | Wt 145.6 lb

## 2024-05-21 DIAGNOSIS — K50018 Crohn's disease of small intestine with other complication: Principal | ICD-10-CM

## 2024-05-21 DIAGNOSIS — E538 Deficiency of other specified B group vitamins: Principal | ICD-10-CM

## 2024-05-21 DIAGNOSIS — R7689 Other specified abnormal immunological findings in serum: Secondary | ICD-10-CM | POA: Diagnosis not present

## 2024-05-21 DIAGNOSIS — R918 Other nonspecific abnormal finding of lung field: Secondary | ICD-10-CM | POA: Diagnosis not present

## 2024-05-21 DIAGNOSIS — F17210 Nicotine dependence, cigarettes, uncomplicated: Secondary | ICD-10-CM

## 2024-05-21 DIAGNOSIS — Z716 Tobacco abuse counseling: Secondary | ICD-10-CM

## 2024-05-21 DIAGNOSIS — K50919 Crohn's disease, unspecified, with unspecified complications: Secondary | ICD-10-CM

## 2024-05-21 LAB — CBC WITH DIFFERENTIAL/PLATELET
Basophils Absolute: 0 K/uL (ref 0.0–0.1)
Basophils Relative: 0.1 % (ref 0.0–3.0)
Eosinophils Absolute: 0 K/uL (ref 0.0–0.7)
Eosinophils Relative: 0.1 % (ref 0.0–5.0)
HCT: 39.9 % (ref 36.0–46.0)
Hemoglobin: 12.9 g/dL (ref 12.0–15.0)
Lymphocytes Relative: 5.2 % — ABNORMAL LOW (ref 12.0–46.0)
Lymphs Abs: 0.8 K/uL (ref 0.7–4.0)
MCHC: 32.4 g/dL (ref 30.0–36.0)
MCV: 106.5 fl — ABNORMAL HIGH (ref 78.0–100.0)
Monocytes Absolute: 0.8 K/uL (ref 0.1–1.0)
Monocytes Relative: 5.2 % (ref 3.0–12.0)
Neutro Abs: 13.8 K/uL — ABNORMAL HIGH (ref 1.4–7.7)
Neutrophils Relative %: 89.4 % — ABNORMAL HIGH (ref 43.0–77.0)
Platelets: 410 K/uL — ABNORMAL HIGH (ref 150.0–400.0)
RBC: 3.75 Mil/uL — ABNORMAL LOW (ref 3.87–5.11)
RDW: 15.8 % — ABNORMAL HIGH (ref 11.5–15.5)
WBC: 15.4 K/uL — ABNORMAL HIGH (ref 4.0–10.5)

## 2024-05-21 MED ORDER — NIFEDIPINE ER 30 MG TABLET,EXTENDED RELEASE 24 HR
ORAL_TABLET | Freq: Every day | ORAL | 0 refills | 90.00000 days | Status: CP
Start: 2024-05-21 — End: 2024-08-19
  Filled 2024-07-30: qty 90, 90d supply, fill #0

## 2024-05-21 MED ORDER — CYANOCOBALAMIN (VIT B-12) 1,000 MCG/ML INJECTION SOLUTION
Freq: Once | SUBCUTANEOUS | 0 refills | 1.00000 days | Status: CP
Start: 2024-05-21 — End: 2024-05-22
  Filled 2024-05-21: qty 1, 30d supply, fill #0

## 2024-05-21 MED ORDER — SULFAMETHOXAZOLE-TRIMETHOPRIM 800-160 MG PO TABS: 1.0000 | ORAL_TABLET | ORAL | 0 refills | Status: DC

## 2024-05-21 MED FILL — FAMOTIDINE 40 MG TABLET: ORAL | 90 days supply | Qty: 180 | Fill #1

## 2024-05-21 MED FILL — FOLIC ACID 1 MG TABLET: ORAL | 90 days supply | Qty: 450 | Fill #2

## 2024-05-21 NOTE — Unmapped (Signed)
 Addended by: Tariyah Pendry K on: 05/21/2024 03:06 PM     Modules accepted: Orders

## 2024-05-21 NOTE — Unmapped (Signed)
 Patient is requesting the following refill  Requested Prescriptions     Pending Prescriptions Disp Refills    cyanocobalamin , vitamin B-12, 1,000 mcg/mL injection 1 mL 0     Sig: Inject 1 mL (1,000 mcg total) under the skin once for 1 dose.       Recent Visits  Date Type Provider Dept   04/12/24 Office Visit Alyse Slater Pao, MD [place] [facility] [address] [facility]   04/09/24 Office Visit Ritaccio, Marry BRAVO, MD [facility] [place]   04/04/24 Office Visit Quintin Stephane BRAVO, NP [place] [facility] [address] At [facility]   12/05/23 Office Visit Bobby Suzen Lambing, MD [facility] [place]   11/30/23 Office Visit Alyse Slater Pao, MD [place] [facility] [address] At [facility]   09/12/23 Office Visit Bobby Suzen Lambing, MD [facility] [place]   06/01/23 Office Visit Alyse Slater Pao, MD [place] [facility] [address] At [facility]   05/24/23 Office Visit Alyse Slater Pao, MD [place] [facility] [address] At [facility]   Showing recent visits within past 365 days and meeting all other requirements  Future Appointments  Date Type Provider Dept   06/04/24 Appointment Alyse Slater Pao, MD [place] [facility] [address] At [facility]   06/11/24 Appointment Ritaccio, Marry BRAVO, MD [facility]   Showing future appointments within next 365 days and meeting all other requirements           Encounter for refill request:    Colonoscopy:   Appointments which have been scheduled for you      Jun 04, 2024 2:20 PM  (Arrive by 2:05 PM)  PHYSICAL EXAM with Slater Pao Alyse, MD  [facility] PRIMARY CARE [address] AT [facility] REGION) [address] [place]  [address]  [phone]   Arrive 15 minutes prior to appointment.         Jun 11, 2024 12:30 PM  (Arrive by 12:15 PM)  RETURN IBD with Marry BRAVO [place], MD  [facility]IC[facility] [place] [facility]) [address]  [facility] 1 through 4  [place] [address]  [phone]        Aug 05, 2024 9:20 AM  (Arrive by 9:05 AM)  SHERIDA TEST with MEDDDN MOTILITY NURSE  [facility] GI MEDICINE [place] [facility] REGION) [address]  [facility] 1 through 4  [place] [address]  [phone]             Lab Results   Component Value Date    WBC 7.1 04/09/2024    RBC 3.76 (L) 04/09/2024    HGB 13.7 04/09/2024    HCT 40.1 04/09/2024    PLT 401 04/09/2024    ALT 18 04/09/2024    AST 17 04/09/2024    ALKPHOS 107 04/09/2024    CRP 17.8 (H) 04/09/2024    CREATININE 0.90 04/09/2024       refills authorized x

## 2024-05-21 NOTE — Assessment & Plan Note (Addendum)
 Differential diagnosis: Drug-induced pneumonitis versus other infectious causes w culture growing enterococcus faecalis vs AEP.  Acute HP is a potential cause though no inciting cause identified. Skyrizi, sulfasalazine and Infliximab can cause pneumonitis but not very common.  Diagnosis is uncertain.  Patient is requesting to go off steroids quickly.  Did discuss with her the possibility of flareup if we taper the steroids quickly. Get chest x-ray.  If showing infiltrates order CT scan and bronchoscopy with BAL and biopsy.  If chest x-ray is clear we will just complete the taper of steroids. Currently on 40 mg of prednisone  which will reduce to 30 mg on Friday. Added PJP prophylaxis with Bactrim . Orders:   Aspergillus galactomannan antigen   CBC w/Diff; Future   DG Chest 2 View; Future

## 2024-05-21 NOTE — Patient Instructions (Addendum)
 Notification of test results are managed in the following manner: If there are any recommendations or changes to the plan of care discussed in office today, we will contact you and let you know what they are. If you do not hear from us , then your results are normal/expected and you can view them through your MyChart account, or a letter will be sent to you. Thank you again for trusting us  with your care [facility].  We will get CXR and if positive we will get CT chest and bronch.

## 2024-05-21 NOTE — Assessment & Plan Note (Signed)
 For now is off sulfasalazine and Skyrizi. Advised to follow-up with GI. Currently is on steroid taper which will help with Crohn's disease.

## 2024-05-21 NOTE — Addendum Note (Signed)
 Addended by: ROLANDA POWELL SAILOR on: 05/21/2024 10:30 AM   Modules accepted: Orders
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
## 2024-05-21 NOTE — Addendum Note (Signed)
 Addended by: Lateefa Crosby M on: 05/21/2024 09:54 AM   Modules accepted: Orders

## 2024-05-23 ENCOUNTER — Telehealth: Payer: Self-pay

## 2024-05-23 DIAGNOSIS — R490 Dysphonia: Secondary | ICD-10-CM
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
## 2024-05-23 NOTE — Telephone Encounter (Signed)
 Copied from CRM [id number]. Topic: Clinical - Lab/Test Results >> May 22, 2024  3:15 PM Rozanna MATSU wrote: Reason for CRM: Pt called about xray from yesterday advised her it was not seen yet by provider. Pt would like a call about the next step.  Dr. Theodoro can you please advise of x-ray results

## 2024-05-24 ENCOUNTER — Encounter: Admit: 2024-05-24 | Discharge: 2024-05-25 | Payer: Medicaid (Managed Care)

## 2024-05-24 DIAGNOSIS — R197 Diarrhea, unspecified: Principal | ICD-10-CM

## 2024-05-24 DIAGNOSIS — E538 Deficiency of other specified B group vitamins: Principal | ICD-10-CM

## 2024-05-24 DIAGNOSIS — K50018 Crohn's disease of small intestine with other complication: Principal | ICD-10-CM

## 2024-05-24 DIAGNOSIS — Z720 Tobacco use: Principal | ICD-10-CM

## 2024-05-24 LAB — ASPERGILLUS ANTIGEN, BAL/SERUM: Aspergillus Ag, BAL/Serum: 0.03 {index} (ref 0.00–0.49)

## 2024-05-24 MED ORDER — CYANOCOBALAMIN (VIT B-12) 1,000 MCG/ML INJECTION SOLUTION
SUBCUTANEOUS | 2 refills | 84.00000 days | Status: CP
Start: 2024-05-24 — End: ?

## 2024-05-24 MED ORDER — RIFAXIMIN 550 MG TABLET
ORAL_TABLET | Freq: Three times a day (TID) | ORAL | 0 refills | 14.00000 days | Status: CP
Start: 2024-05-24 — End: 2024-06-07

## 2024-05-24 MED ORDER — NICOTINE 21 MG/24 HR DAILY TRANSDERMAL PATCH
MEDICATED_PATCH | TRANSDERMAL | 2 refills | 30.00000 days | Status: CP
Start: 2024-05-24 — End: ?

## 2024-05-24 NOTE — Unmapped (Signed)
 [facility]    Appointments: [phone]  Nurse Contact: Larraine Patrick, RN [phone]   Fax: [phone]        Here are my recommendations:     Start Skyrizi    Will will follow up results of CT enterography  Labs around 12/1, ordered to [facility]  Follow up around mid December, virtual or in person. If our schedulers do not call you to schedule this appointment, please call and make the appointment using the GI clinic Appointments number below.      Nurse Contact:   Larraine Patrick, RN: [phone]     Important phone numbers:  GI Clinic Appointments:  [phone] option 1 then option 1  GI Procedure Appointments:  [phone] option 1 then option 2  Radiology: [phone]   Financial Counselor: [phone]

## 2024-05-27 NOTE — Progress Notes (Signed)
 Called and relayed results to the pt. Pt verbalized understanding. Pt is currently taking 10mg  that was directed by GI to lower dose. Pt complains of coughing w/ yellow phlegm x2 days. Pt has hoarse voice as well.  Please advise Dr. Pawar

## 2024-05-29 NOTE — Telephone Encounter (Signed)
 Called and spoke to the [name]. Advised of Dr. Theodoro note. [name] is concerned how long she is to prednisone  30mg  since it makes her sick. [name] complains of diarrhea, nausea, and swollen legs and ankles.  [name] does seem very stressed and would like to speak with Dr. Theodoro directly about her concerns.

## 2024-05-31 NOTE — Telephone Encounter (Signed)
 Patient requested for callback as she does not want to be on higher doses of prednisone .  She herself went from 30 mg of prednisone  to 10 mg prednisone  around a week back.  For last few days she has been feeling increased shortness of breath which she ascribes is because of her allergies.  I did tell her that the steroid should be controlling her allergies.  However her pulse ox has been stable and she is not having any hypoxia.  She would not want to go back on 30 mg of prednisone .  I did discuss with her that my recommendation stillstands of doing 30 mg of prednisone  for 2 weeks and then transitioning to 20 mg after 2 weeks and then 10 mg after 2 weeks.  I did discuss with the patient that if she gets worsening shortness of breath to call our office or if the symptoms are severe enough go to the emergency room as there is a risk that her lung infiltrates can come back again and it lower doses of steroids. She had an appointment with her GI and she is continued on her Skyrizi.  Her sulfasalazine has been discontinued.  Patient understands.  Patient has a follow-up appointment with me on 06/21/2024.  I will see her then.

## 2024-06-04 DIAGNOSIS — Z5320 Procedure and treatment not carried out because of patient's decision for unspecified reasons: Principal | ICD-10-CM

## 2024-06-04 DIAGNOSIS — R918 Other nonspecific abnormal finding of lung field: Principal | ICD-10-CM

## 2024-06-04 DIAGNOSIS — R053 Chronic cough: Principal | ICD-10-CM
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
## 2024-06-05 NOTE — Telephone Encounter (Signed)
 Patient is stated her symptoms are getting worse and not better the only thig that she does have is the shortness of breath and she is still on the predinsone 10mg     Copied from CRM #[id number]. Topic: Clinical - Medical Advice >> Jun 05, 2024 10:48 AM Corean SAUNDERS wrote: Reason for CRM: Patient is requesting Marcus to call her back as she states the nurse was supposed to call her back a few days ago but never did and is worried because her sore throat and hoarseness is becoming worse.   Please call patient back to advise.

## 2024-06-06 NOTE — Telephone Encounter (Signed)
 Spoke with patient she does not want to do the 30mg  prednisone  she would rather just do the 10mg  that she is already on and is wondering why she can't just have a Bronchoscopy to figure out why she is still hoarse and keeps losing her voice and her throat is soar but will keep watch and if needs a acute visit will set one up   NFN

## 2024-06-06 NOTE — Addendum Note (Signed)
 Addended by: Jettie Lazare D on: 06/06/2024 12:59 PM   Modules accepted: Orders
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
## 2024-06-11 ENCOUNTER — Institutional Professional Consult (permissible substitution) (INDEPENDENT_AMBULATORY_CARE_PROVIDER_SITE_OTHER): Admitting: Otolaryngology

## 2024-06-11 ENCOUNTER — Encounter: Admit: 2024-06-11 | Discharge: 2024-06-12 | Payer: Worker's Compensation

## 2024-06-11 DIAGNOSIS — R053 Chronic cough: Principal | ICD-10-CM

## 2024-06-11 DIAGNOSIS — K50018 Crohn's disease of small intestine with other complication: Principal | ICD-10-CM

## 2024-06-11 NOTE — Progress Notes (Unsigned)
 [facility]  Pulmonary Clinic - Initial Visit    Referring Physician :  Slater Darice Sole  PCP:     Sole Slater Darice, MD  Reason for Consult:   Chronic Cough    ASSESSMENT and PLAN     Problem List  #  #  #  #    History of Present Illness: Ms. Fuquay is a 43 y.o. female who is seen in consultation at the request of Sole Slater Darice, MD for comprehensive evaluation of chronic cough.    # ***  -   - RTC in     Plan of care was discussed with the patient who acknowledged understanding and is in agreement.    Patient will No follow-ups on file. or sooner if needed.    Ms. Baranski was seen, examined and discussed with {Pulmonary Attendings:81951} who agrees with the assessment and plan above.     Donia Gentle, MD DrPH   Pulmonary and Critical Care Fellow    HISTORY:      History of Present Illness: Ms. Dorsainvil is a 43 y.o. female who is seen for chief complaint of chronic cough.    CT angiography indicated severe bilateral infiltrates consistent with multifocal pneumonia   Sputum culture revealed abundant Enterococcus faecalis, and Aspergillus antigen was slightly positive.   Chronic cough and recurrent infections likely due to immunosuppression from Skyrizi  from Crohn's disease. Previous antibiotics ( Azithromymycin, Doxycycline , levaquin ) and steroids ineffective. Skyrizi  associated with >10% risk of upper respiratory infections.     Patient accompanied by *** today.     Asthma:  Seasonal allergies: ***  Eczema: ***  Childhood asthma: ***  Aspirin /NSAID sensitivity: ***  Triggers: ***    Exacerbations: ***  Nocturnal Symptoms: ***  Trigger avoidance: ***  Peak flows: ***  Rescue Inhaler use: ***  Controller inhaler adherance: ***    COPD  Exacerbations: ***  Rescue inhaler use: ***  Controller inhaler adherence: ***  Tobacco use: ***  mMRC:     0 I only get breathless with strenuous exercise  1 I get short of breath when hurrying on level ground or walking up a slight hill  2 On level ground, I walk slower than people of the same age because of breathlessness, or have to stop for a breath when walking at my own pace  3 I stop for a breath after walking about 100 yards or after a few minutes on level ground  4 I am too breathless to leave the house or I am breathless when dressing    Autoimmune ROS  Rash: ***  Morning stiffness: ***  Joint swelling: ***  Fevers: ***  Night sweats: ***  Digital ulceration: ***  Fingertip skin stiffening: ***  Raynaud's: ***  Dysphagia: ***  Sicca: ***    Exposure/Occupational:   *** Admits/Denies Tobacco use: *** ppd/years  *** Admits/Denies Vaping:   *** Admits/Denies Illicit drug use:   - Admits to: ***  - *** Pets/Animals (Cat, Dog, Birds)    ILD-Associated Risk Factors  [{YES/NO:103896::No  }] Medications - amiodarone, bleomycin, cyclophosphamide, methotrexate, nitrofurantoin, penicillamine, other chemotherapy  [{YES/NO:103896::No  }] Asbestos  [{YES/NO:103896::No  }] Beryllium, glass cutting, mining activities, silica (sandblasting), woodworking  [{YES/NO:103896::No  }] Farm work or [employer] farming  [{YES/NO:103896::No  }] Pet birds/raise birds or down pillow/bedding  [{YES/NO:103896::No  }] Mold - mold damage at home or home ever flooded  [{YES/NO:103896::No  }] Hot tub with standing water or sauna  exposure/use  [{YES/NO:103896::No  }] Swamp cooler    Family history:  - No family history of lung diseases, lung cancers, autoimmune conditions ***     Social History:  - Occupation: ***  - Lives with: ***    OSH records and referral documentation reviewed.     Review of Systems:  A comprehensive review of systems was completed and negative except as noted in HPI.    Relevant Medications: Reviewed    Allergies: Reviewed.    Other History:  The past medical history, surgical history, social history, family history, medications and allergies were personally reviewed and updated in the patient's electronic medical record. Pertinent items are noted above.        PHYSICAL EXAM:      Physical Exam:There were no vitals filed for this visit.  There is no height or weight on file to calculate BMI.    General: Well developed, well nourished, BMI  HEENT: EOMI, anicteric sclera, trachea midline  CV: Regular rate and rhythm, no murmurs  Resp: Clear to auscultation bilaterally, no wheezing or rhonchi  GI: Soft, non tender  Extremities: No bilateral lower extremity swelling  Neuro: A&Ox3, Moving all extremities spontaneously  ***         LABORATORY and RADIOLOGY DATA:     Pulmonary Function Test Results:    Date FEV1  (% Pred) FVC   (% Pred) FEV1/FVC DLCO  (% Pred) TLC Desat  Distance                         : ***    Pertinent Laboratory Data:  Reviewed    Pertinent Imaging Data:  Images were personally reviewed with attending.       Donia Gentle, MD DrPH   Pulmonary and Critical Care Fellow

## 2024-06-12 LAB — CBC W/ DIFFERENTIAL
BANDED NEUTROPHILS ABSOLUTE COUNT: 0.5 x10E3/uL — ABNORMAL HIGH (ref 0.0–0.1)
BASOPHILS ABSOLUTE COUNT: 0.1 x10E3/uL (ref 0.0–0.2)
BASOPHILS RELATIVE PERCENT: 1 %
EOSINOPHILS ABSOLUTE COUNT: 0.1 x10E3/uL (ref 0.0–0.4)
EOSINOPHILS RELATIVE PERCENT: 1 %
HEMATOCRIT: 42.1 % (ref 34.0–46.6)
HEMOGLOBIN: 13.9 g/dL (ref 11.1–15.9)
IMMATURE GRANULOCYTES: 4 %
LYMPHOCYTES ABSOLUTE COUNT: 2.3 x10E3/uL (ref 0.7–3.1)
LYMPHOCYTES RELATIVE PERCENT: 22 %
MEAN CORPUSCULAR HEMOGLOBIN CONC: 33 g/dL (ref 31.5–35.7)
MEAN CORPUSCULAR HEMOGLOBIN: 35.1 pg — ABNORMAL HIGH (ref 26.6–33.0)
MEAN CORPUSCULAR VOLUME: 106 fL — ABNORMAL HIGH (ref 79–97)
MONOCYTES ABSOLUTE COUNT: 0.6 x10E3/uL (ref 0.1–0.9)
MONOCYTES RELATIVE PERCENT: 6 %
NEUTROPHILS ABSOLUTE COUNT: 6.9 x10E3/uL (ref 1.4–7.0)
NEUTROPHILS RELATIVE PERCENT: 66 %
PLATELET COUNT: 342 x10E3/uL (ref 150–450)
RED BLOOD CELL COUNT: 3.96 x10E6/uL (ref 3.77–5.28)
RED CELL DISTRIBUTION WIDTH: 14 % (ref 11.7–15.4)
WHITE BLOOD CELL COUNT: 10.5 x10E3/uL (ref 3.4–10.8)

## 2024-06-12 LAB — COMPREHENSIVE METABOLIC PANEL
ALBUMIN: 4.5 g/dL (ref 3.9–4.9)
ALKALINE PHOSPHATASE: 96 IU/L (ref 41–116)
ALT (SGPT): 28 IU/L (ref 0–32)
AST (SGOT): 22 IU/L (ref 0–40)
BILIRUBIN TOTAL (MG/DL) IN SER/PLAS: 0.2 mg/dL (ref 0.0–1.2)
BLOOD UREA NITROGEN: 19 mg/dL (ref 6–24)
BUN / CREAT RATIO: 19 (ref 9–23)
CALCIUM: 9.2 mg/dL (ref 8.7–10.2)
CHLORIDE: 103 mmol/L (ref 96–106)
CO2: 20 mmol/L (ref 20–29)
CREATININE: 1.02 mg/dL — ABNORMAL HIGH (ref 0.57–1.00)
EGFR: 70 mL/min/1.73
GLOBULIN, TOTAL: 1.9 g/dL (ref 1.5–4.5)
GLUCOSE: 143 mg/dL — ABNORMAL HIGH (ref 70–99)
POTASSIUM: 4.2 mmol/L (ref 3.5–5.2)
SODIUM: 138 mmol/L (ref 134–144)
TOTAL PROTEIN: 6.4 g/dL (ref 6.0–8.5)

## 2024-06-12 LAB — C-REACTIVE PROTEIN: C-REACTIVE PROTEIN: 4 mg/L (ref 0–10)

## 2024-06-12 LAB — IRON & TIBC
IRON SATURATION: 29 % (ref 15–55)
IRON: 121 ug/dL (ref 27–159)
TOTAL IRON BINDING CAPACITY: 415 ug/dL (ref 250–450)
UNSATURATED IRON BINDING CAPACITY: 294 ug/dL (ref 131–425)

## 2024-06-12 LAB — FERRITIN: FERRITIN: 150 ng/mL (ref 15–150)

## 2024-06-13 DIAGNOSIS — K50018 Crohn's disease of small intestine with other complication: Principal | ICD-10-CM

## 2024-06-13 MED ORDER — SKYRIZI 360 MG/2.4 ML (150 MG/ML) SUBCUTANEOUS WEARABLE INJECTOR
0 refills | 0.00000 days
Start: 2024-06-13 — End: ?

## 2024-06-13 NOTE — Progress Notes (Signed)
 [facility] Clinical Assessment & Refill Coordination Note    Mary Hawkins, DOB: 25-Dec-1980  Phone: [phone] (home)     All above HIPAA information was verified with patient.     Was a nurse, learning disability used for this call? No    Specialty Medication(s):   Inflammatory Disorders: Skyrizi      Current Medications[1]     Changes to medications: Jeiry reports no changes at this time.    Medication list has been reviewed and updated in Epic: Yes    Allergies[2]    Changes to allergies: No    Allergies have been reviewed and updated in Epic: Yes    SPECIALTY MEDICATION ADHERENCE     Skyrizi  360 mg/2.52mL: 0 doses of medicine on hand   Medication Adherence    Patient reported X missed doses in the last month: 0  Specialty Medication: Skyrizi   Patient is on additional specialty medications: No  Informant: patient          Specialty medication(s) dose(s) confirmed: Regimen is correct and unchanged.     Are there any concerns with adherence? Patient had an initial delay in starting OBI due to recent hospitalization     Adherence counseling provided? Not needed    CLINICAL MANAGEMENT AND INTERVENTION      Clinical Benefit Assessment:    Do you feel the medicine is effective or helping your condition? Yes    Clinical Benefit counseling provided? Not needed    Adverse Effects Assessment:    Are you experiencing any side effects? No    Are you experiencing difficulty administering your medicine? No    Quality of Life Assessment:    Quality of Life    Rheumatology  Oncology  Dermatology  Cystic Fibrosis          How many days over the past month did your Crohn's  keep you from your normal activities? For example, brushing your teeth or getting up in the morning. 0    Have you discussed this with your provider? Not needed    Acute Infection Status:    Acute infections noted within Epic:  No active infections    Patient reported infection: None    Therapy Appropriateness:    Is the medication and dose appropriate considering the patient???s diagnosis, treatment, and disease journey, comorbidities, medical history, current medications, allergies, therapeutic goals, self-administration ability, and access barriers? Yes, therapy is appropriate and should be continued     Clinical Intervention:    Was an intervention completed as part of this clinical assessment? No    DISEASE/MEDICATION-SPECIFIC INFORMATION      For patients on injectable medications: Next injection is scheduled for beginning of December.    Chronic Inflammatory Diseases: Have you experienced any flares in the last month? No  Has this been reported to your provider? Not applicable    PATIENT SPECIFIC NEEDS     Does the patient have any physical, cognitive, or cultural barriers? No    Is the patient high risk? No    Does the patient require physician intervention or other additional services (i.e., nutrition, smoking cessation, social work)? No    Does the patient have an additional or emergency contact listed in their chart? Yes    SOCIAL DETERMINANTS OF HEALTH     At the [facility], we have learned that life circumstances - like trouble affording food, housing, utilities, or transportation can affect the health of many of our patients.   That  is why we wanted to ask: are you currently experiencing any life circumstances that are negatively impacting your health and/or quality of life? No    Social Drivers of Health     Food Insecurity: No Food Insecurity (05/06/2024)    Received from [facility]    Hunger Vital Sign     Within the past 12 months, you worried that your food would run out before you got the money to buy more.: Never true     Within the past 12 months, the food you bought just didn't last and you didn't have money to get more.: Never true   Tobacco Use: High Risk (06/04/2024)    Patient History     Smoking Tobacco Use: Every Day     Smokeless Tobacco Use: Never     Passive Exposure: Current   Transportation Needs: No Transportation Needs (05/06/2024)    Received from [facility] - Transportation     In the past 12 months, has lack of transportation kept you from medical appointments or from getting medications?: No     In the past 12 months, has lack of transportation kept you from meetings, work, or from getting things needed for daily living?: No   Alcohol Use: Not At Risk (02/19/2024)    Received from [facility]    Alcohol     Audit-C Score: 0   Housing: Patient Declined (01/10/2024)    Received from [facility] Stability Vital Sign     In the last 12 months, was there a time when you were not able to pay the mortgage or rent on time?: Patient declined     Number of Times Moved in the Last Year: Not on file     At any time in the past 12 months, were you homeless or living in a shelter (including now)?: Patient declined   Physical Activity: Unknown (01/10/2024)    Received from [facility]    Exercise Vital Sign     On average, how many days per week do you engage in moderate to strenuous exercise (like a brisk walk)?: Patient declined     Minutes of Exercise per Session: Not on file   Utilities: Not At Risk (05/06/2024)    Received from [facility] Utilities     In the past 12 months has the electric, gas, oil, or water company threatened to shut off services in your home?: No   Stress: Patient Declined (01/10/2024)    Received from [facility] - Occupational Stress Questionnaire     Feeling of Stress : Patient declined   Interpersonal Safety: Not At Risk (04/09/2024)    Interpersonal Safety     Unsafe Where You Currently Live: No     Physically Hurt by Anyone: No     Abused by Anyone: No   Substance Use: Not on file (06/12/2023)   Intimate Partner Violence: Not At Risk (05/06/2024)    Received from [facility]    Humiliation, Afraid, Rape, and Kick questionnaire     Within the last year, have you been afraid of your partner or ex-partner?: No     Within the last year, have you been humiliated or emotionally abused in other ways by your partner or ex-partner?: No     Within the last year, have you been kicked, hit, slapped, or otherwise physically hurt by your partner or  ex-partner?: No     Within the last year, have you been raped or forced to have any kind of sexual activity by your partner or ex-partner?: No   Social Connections: Not on file   Financial Resource Strain: Patient Declined (01/10/2024)    Received from [facility]    Overall Financial Resource Strain (CARDIA)     Difficulty of Paying Living Expenses: Patient declined   Health Literacy: Not on file   Internet Connectivity: Internet connectivity concern unknown (11/30/2023)    Internet Connectivity     Do you have access to internet services: Yes     How do you connect to the internet: Not on file     Is your internet connection strong enough for you to watch video on your device without major problems?: Not on file     Do you have enough data to get through the month?: Not on file     Does at least one of the devices have a camera that you can use for video chat?: Not on file       Would you be willing to receive help with any of the needs that you have identified today? Not applicable       SHIPPING     Specialty Medication(s) to be Shipped:   Inflammatory Disorders: Skyrizi     Other medication(s) to be shipped: No additional medications requested for fill at this time    Specialty Medications not needed at this time: N/A     Changes to insurance: No    Cost and Payment: Patient has a copay of $4. They are aware and have authorized the pharmacy to charge the credit card on file.    Delivery Scheduled: Yes, Expected medication delivery date: 11/12.  However, Rx request for refills was sent to the provider as there are none remaining.     Medication will be delivered via UPS to the confirmed prescription address in [facility].    The patient will receive a drug information handout for each medication shipped and additional FDA Medication Guides as required.  Verified that patient has previously received a Conservation Officer, Historic Buildings and a Surveyor, Mining.    The patient or caregiver noted above participated in the development of this care plan and knows that they can request review of or adjustments to the care plan at any time.      All of the patient's questions and concerns have been addressed.    Harlene DELENA Elder, PharmD   [facility] Specialty Pharmacist       [1]   Current Outpatient Medications   Medication Sig Dispense Refill    acetaminophen  (TYLENOL ) 500 MG tablet Take 2 tablets (1,000 mg total) by mouth every six (6) hours as needed for pain.      albuterol  HFA 90 mcg/actuation inhaler Inhale 2 puffs every 4-6 hrs with spacer for wheezing 6.7 g 0    amitriptyline (ELAVIL) 75 MG tablet Take 1 tablet (75 mg total) by mouth.      aripiprazole (ABILIFY) 2 MG tablet Take 1 tablet (2 mg total) by mouth every morning.      aspirin -acetaminophen -caffeine  (EXCEDRIN  MIGRAINE) 250-250-65 mg per tablet Take 1 tablet by mouth. (Patient taking differently: Take 1 tablet by mouth. PRN)      baclofen  (LIORESAL ) 20 MG tablet Take 1 tablet (20 mg total) by mouth four (4) times a day. (Patient taking differently: Take 1 tablet (20 mg total) by  mouth four (4) times a day. PRN)      cetirizine (ZYRTEC) 10 MG tablet       chlorhexidine (PERIDEX) 0.12 % solution       cholecalciferol , vitD3,/vit K2 (VITAMIN D3-VITAMIN K2) 250 mcg (10,000 unit)-45 mcg cap Take by mouth. 100 mcg K2      clonazePAM  (KLONOPIN ) 1 MG disintegrating tablet PLEASE SEE ATTACHED FOR DETAILED DIRECTIONS      cyanocobalamin , vitamin B-12, 1,000 mcg/mL injection Inject 1 mL (1,000 mcg total) under the skin every fourteen (14) days. 6 mL 2    cycloSPORINE  (RESTASIS ) 0.05 % ophthalmic emulsion Administer 1 drop to both eyes Two (2) times a day. 180 each 3    dextroamphetamine -amphetamine  (ADDERALL ) 20 mg tablet Take 1 tablet (20 mg total) by mouth two (2) times a day.      esomeprazole  (NEXIUM ) 40 MG capsule Take 1 capsule (40 mg total) by mouth two (2) times a day. 60 capsule 5    famotidine  (PEPCID ) 40 MG tablet Take 1 tablet (40 mg total) by mouth two (2) times a day. 90 tablet 3    gabapentin (NEURONTIN) 800 MG tablet Take 1 tablet (800 mg total) by mouth Four (4) times a day with a meal and nightly.      ibuprofen (MOTRIN) 800 MG tablet  (Patient taking differently: PRN)      ipratropium-albuterol  (DUO-NEB) 0.5-2.5 mg/3 mL nebulizer Inhale 3 mL by nebulization every eight (8) hours as needed. 75 mL 1    magnesium  glycinate 100 mg magnesium  cap       mupirocin  (BACTROBAN ) 2 % ointment Apply to affected area of the skin up to three times per day as needed. 30 g 5    naloxone  (NARCAN ) 4 mg nasal spray       nicotine  (NICODERM CQ ) 21 mg/24 hr patch Place 1 patch on the skin daily. 30 patch 2    NIFEdipine  (PROCARDIA  XL) 30 MG 24 hr tablet Take 1 tablet (30 mg total) by mouth daily. 90 tablet 0    ondansetron  (ZOFRAN -ODT) 8 MG disintegrating tablet Dissolve 1 tablet (8 mg total) on the tongue every eight (8) hours as needed for nausea. 60 tablet 4    oxyCODONE  (ROXICODONE ) 15 MG immediate release tablet  (Patient taking differently: PRN)      potassium chloride  (KLOR-CON  M20) 20 MEQ ER tablet Take 1 tablet (20 mEq total) by mouth daily. 30 tablet 5    risankizumab -rzaa (SKYRIZI ) 360 mg/2.4 mL (150 mg/mL) Injt Inject 360 mg with OBI at week 12 post completion of IV loading doses, then every 8 weeks for maintenance. 2.4 mL 0    simethicone  (MYLICON) 80 MG chewable tablet Chew 1 tablet (80 mg total) every six (6) hours as needed for flatulence. 30 tablet 0    simethicone  (PHAZYME) 500 mg cap       varenicline tartrate (CHANTIX PAK) 0.5 mg (11)- 1 mg (42) tablet FOLLOW STARTER PACK INSTRUCTIONS FOR SMOKING CESSATION (Patient taking differently: 1 mg BID)       No current facility-administered medications for this visit.   [2]   Allergies  Allergen Reactions    Doxycycline  Other (See Comments)     Reaction not recalled      Other Reaction(s): other      doxycycline  hydrochloride      doxycycline     Penicillins Hives     Has patient had a PCN reaction causing immediate rash, facial/tongue/throat swelling, SOB or lightheadedness with hypotension: Yes  Has patient had a PCN reaction causing severe rash involving mucus membranes or skin necrosis: Unk    Has patient had a PCN reaction that required hospitalization: Yes    Has patient had a PCN reaction occurring within the last 10 years: No    If all of the above answers are NO, then may proceed with Cephalosporin use.    Other Reaction(s): HIVES, SOB    Doxycycline  Hcl Other (See Comments)     Reaction not recalled

## 2024-06-14 MED ORDER — SKYRIZI 360 MG/2.4 ML (150 MG/ML) SUBCUTANEOUS WEARABLE INJECTOR
SUBCUTANEOUS | 2 refills | 0.00000 days | Status: CP
Start: 2024-06-14 — End: ?
  Filled 2024-06-18: qty 2.4, 56d supply, fill #0

## 2024-06-14 NOTE — Telephone Encounter (Signed)
 Patient is requesting the following refill  Requested Prescriptions     Pending Prescriptions Disp Refills    risankizumab -rzaa (SKYRIZI ) 360 mg/2.4 mL (150 mg/mL) Injt 2.4 mL 0     Sig: Inject 360 mg with OBI at week 12 post completion of IV loading doses, then every 8 weeks for maintenance.       Recent Visits  Date Type Provider Dept   06/11/24 Telemedicine Ritaccio, Marry BRAVO, MD [facility]   06/04/24 Office Visit Alyse Slater Pao, MD [facility] [facility] [address] [facility]   05/24/24 Telemedicine Ritaccio, Marry BRAVO, MD [facility]   04/12/24 Office Visit Alyse Slater Pao, MD [place] [facility] [address] [facility]   04/09/24 Office Visit Ritaccio, Marry BRAVO, MD [facility]   04/04/24 Office Visit Quintin Stephane BRAVO, NP [place] [facility] [address] [facility]   12/05/23 Office Visit Bobby Suzen Lambing, MD [facility] [place]   11/30/23 Office Visit Alyse Slater Pao, MD [place] [facility] [address] [facility]   09/12/23 Office Visit Bobby Suzen Lambing, MD [facility]   Showing recent visits within past 365 days and meeting all other requirements  Future Appointments  No visits were found meeting these conditions.  Showing future appointments within next 365 days and meeting all other requirements           Encounter for refill request:    Colonoscopy:   Appointments which have been scheduled for you      Jun 19, 2024 1:30 PM  (Arrive by 1:15 PM)  DEXA Bone Density with IC DEXA RM 1  [facility]) [address]  [address] Floor  [place] [address]  [phone]   No calcium supplements 24 hrs prior.         Jun 19, 2024 2:20 PM  (Arrive by 2:05 PM)  CT ENTEROGRAPHY ABDOMEN/PELVIS with IC CT RM 1  [facility]) [address] [facility] [address] [facility] [address] [address]  1st Floor  [place] [address]  [phone]   On appt date:  Drink lots of water 24 hrs  Bring recent lab work  Take meds as usual  Civil service fast streamer of current meds  Bring snack if diabetic    On appt date do not:  Consume anything 2 hrs prior to your appointment    Let us  know if pt:  Allergic to contrast dyes  Diabetic  Pregnant or nursing  Claustrophobic    (Title:[id number])         Jul 15, 2024 11:20 AM  (Arrive by 11:05 AM)  PFT with PFT 1  [facility]ULMON[facility] SPECIAL[facility] [place]) [address]  [place] 1 through 4  [place] [address]  [phone]   If you have inhaled breathing medications, please do not use it 4 hours prior to this breathing test.  Please wear comfortable clothing and well-fitting , closed toe shoes in the even that a 6-minute walk is part of your test.  If you have fallen in the past month please inform your respiratory therapist at the start of your appointment.    While you may bring a guest to your appointment, they will not be able to be present in the testing area.         Jul 15, 2024 1:00 PM  (Arrive by 12:45 PM)  NEW PULMONARY with Guido LOISE Bullock, MD  [facility] PULMONARY SPECIALTY CL [place] [facility]) [address]  [facility] 1 through 4  [place] [address]  [phone]        Aug 05, 2024 9:20 AM  (Arrive by 9:05 AM)  SHERIDA TEST with MEDDDN MOTILITY NURSE  [facility] GI MEDICINE [place] [facility]) [address]  [facility] 1 through 4  [place] [address]  [phone]             Lab Results   Component Value Date    WBC 10.5 06/11/2024    RBC 3.96 06/11/2024    HGB 13.9 06/11/2024    HCT 42.1 06/11/2024    PLT 342 06/11/2024    ALT 28 06/11/2024    AST 22 06/11/2024    ALKPHOS 96 06/11/2024    CRP 4 06/11/2024    CREATININE 1.02 (H) 06/11/2024       refills authorized x  

## 2024-06-19 ENCOUNTER — Encounter (INDEPENDENT_AMBULATORY_CARE_PROVIDER_SITE_OTHER): Payer: Self-pay | Admitting: Otolaryngology

## 2024-06-19 ENCOUNTER — Ambulatory Visit (INDEPENDENT_AMBULATORY_CARE_PROVIDER_SITE_OTHER): Admitting: Otolaryngology

## 2024-06-19 ENCOUNTER — Inpatient Hospital Stay

## 2024-06-19 ENCOUNTER — Inpatient Hospital Stay: Admit: 2024-06-19 | Discharge: 2024-06-19 | Payer: Medicaid (Managed Care)

## 2024-06-19 VITALS — BP 123/81 | HR 99 | Temp 98.1°F | Ht 62.0 in | Wt 124.0 lb

## 2024-06-19 DIAGNOSIS — J329 Chronic sinusitis, unspecified: Secondary | ICD-10-CM

## 2024-06-19 DIAGNOSIS — K219 Gastro-esophageal reflux disease without esophagitis: Secondary | ICD-10-CM

## 2024-06-19 DIAGNOSIS — R490 Dysphonia: Secondary | ICD-10-CM

## 2024-06-19 LAB — HEPATIC FUNCTION PANEL
ALKALINE PHOSPHATASE: 96 U/L
ALT (SGPT): 28 U/L
AST (SGOT): 22 U/L

## 2024-06-19 LAB — CBC
HEMATOCRIT: 42.1 %
HEMOGLOBIN: 13.9 g/dL
MEAN CORPUSCULAR VOLUME: 106 fL — ABNORMAL HIGH
PLATELET COUNT: 342 10*9/L
RED BLOOD CELL COUNT: 3.96 10*12/L
WBC ADJUSTED: 10.5 10*9/L

## 2024-06-19 LAB — C-REACTIVE PROTEIN: C-REACTIVE PROTEIN: 4 mg/L

## 2024-06-19 MED ADMIN — iohexol (OMNIPAQUE) 350 mg iodine/mL solution 100 mL: 100 mL | INTRAVENOUS | @ 19:00:00 | Stop: 2024-06-19

## 2024-06-19 NOTE — Progress Notes (Signed)
 Labs collected 06/11/2024  Labs documented 06/19/2024
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
## 2024-06-21 ENCOUNTER — Ambulatory Visit (INDEPENDENT_AMBULATORY_CARE_PROVIDER_SITE_OTHER)

## 2024-06-21 VITALS — BP 134/80 | HR 100 | Temp 98.8°F | Wt 162.0 lb

## 2024-06-21 DIAGNOSIS — R490 Dysphonia: Secondary | ICD-10-CM | POA: Diagnosis not present

## 2024-06-21 DIAGNOSIS — F17210 Nicotine dependence, cigarettes, uncomplicated: Secondary | ICD-10-CM

## 2024-06-21 DIAGNOSIS — Z716 Tobacco abuse counseling: Secondary | ICD-10-CM | POA: Diagnosis not present

## 2024-06-21 DIAGNOSIS — R918 Other nonspecific abnormal finding of lung field: Secondary | ICD-10-CM

## 2024-06-21 DIAGNOSIS — K50919 Crohn's disease, unspecified, with unspecified complications: Secondary | ICD-10-CM

## 2024-06-21 MED ORDER — CEFPODOXIME PROXETIL 100 MG PO TABS: 100.0000 mg | ORAL_TABLET | Freq: Two times a day (BID) | ORAL | 0 refills | Status: DC

## 2024-06-21 NOTE — Patient Instructions (Signed)
  VISIT SUMMARY: During your follow-up visit, we discussed your ongoing issues with pneumonitis, hoarseness, and rheumatoid factor positivity. We also addressed your efforts to quit smoking and reviewed your recent weight gain after discontinuing steroids.  YOUR PLAN: -PNEUMONITIS WITH PERSISTENT YELLOW SPUTUM PRODUCTION: Pneumonitis is inflammation of the lung tissue. You have been experiencing persistent yellow phlegm since April, which has not responded to previous treatments. We have prescribed cefpodoxime for 10 days and ordered a chest CT scan to rule out any infection. Please follow up with your pulmonologist at [facility].  -DYSPHONIA [facility]): Dysphonia refers to hoarseness or difficulty speaking. Your hoarseness is likely due to thickening and inflammation of your vocal cords, possibly related to sinus issues.   -RHEUMATOID FACTOR POSITIVITY: Rheumatoid factor positivity can be an indicator of rheumatoid arthritis, an autoimmune condition that causes joint inflammation. It is important to follow up with your rheumatologist to assess the need for further intervention.  -TOBACCO USE DISORDER: Tobacco use disorder refers to the dependence on tobacco products. You are currently smoking less than half a pack per day and are making efforts to quit. We discussed staying busy and considering hypnotherapy as part of your smoking cessation plan. We also talked about using wooden cigarettes as a habit replacement.  INSTRUCTIONS: Please take cefpodoxime as prescribed for 10 days. Schedule a chest CT scan and follow up with your pulmonologist at [facility]. Follow up with your rheumatologist to assess your rheumatoid factor positivity and potential rheumatoid arthritis. Continue your efforts to quit smoking and consider the strategies we discussed.                      Contains text generated by [employer].                                 Contains text generated by  [employer].
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
## 2024-06-21 NOTE — Assessment & Plan Note (Addendum)
 [name]

## 2024-06-21 NOTE — Assessment & Plan Note (Addendum)
 Differential diagnosis: Drug-induced pneumonitis versus other infectious causes w culture growing enterococcus faecalis vs AEP.  Acute HP is a potential cause though no inciting cause identified. Skyrizi, sulfasalazine and Infliximab can cause pneumonitis but not very common.  She rapidly tapered off steroids [was recommended to taper slowly but with weight gain she went off steroids very rapidly within few weeks].  Follow-up chest x-ray had shown resolution of infiltrates. Still having cough which will treat with antibiotic Vantin.  Has grown Enterococcus faecalis from sputum Getting repeat CT chest  Orders:   CT CHEST HIGH RESOLUTION; Future

## 2024-06-24 ENCOUNTER — Ambulatory Visit: Admitting: Internal Medicine

## 2024-06-24 ENCOUNTER — Telehealth: Payer: Self-pay

## 2024-06-24 ENCOUNTER — Encounter: Payer: Self-pay | Admitting: Internal Medicine

## 2024-06-24 ENCOUNTER — Other Ambulatory Visit (HOSPITAL_COMMUNITY): Payer: Self-pay

## 2024-06-24 ENCOUNTER — Ambulatory Visit: Payer: Self-pay

## 2024-06-24 VITALS — BP 130/78 | HR 105 | Ht 62.0 in | Wt 163.0 lb

## 2024-06-24 DIAGNOSIS — F17210 Nicotine dependence, cigarettes, uncomplicated: Secondary | ICD-10-CM

## 2024-06-24 DIAGNOSIS — M7989 Other specified soft tissue disorders: Secondary | ICD-10-CM

## 2024-06-24 DIAGNOSIS — R0609 Other forms of dyspnea: Secondary | ICD-10-CM | POA: Insufficient documentation

## 2024-06-24 MED ORDER — FUROSEMIDE 20 MG PO TABS: 20.0000 mg | ORAL_TABLET | Freq: Every day | ORAL | 0 refills | Status: DC | PRN

## 2024-06-24 MED ORDER — CEFDINIR 300 MG PO CAPS
300.0000 mg | ORAL_CAPSULE | Freq: Two times a day (BID) | ORAL | 0 refills | Status: AC
Start: 2024-06-24 — End: ?

## 2024-06-24 MED ORDER — CEFPODOXIME PROXETIL 200 MG PO TABS: 200.0000 mg | ORAL_TABLET | Freq: Two times a day (BID) | ORAL | 0 refills | Status: DC

## 2024-06-24 NOTE — Telephone Encounter (Signed)
*  Pulm  Pharmacy Patient Advocate Encounter   Received notification from Fax that prior authorization for Cefpodoxime Proxetil 100MG  tablets is required/requested.   Insurance verification completed.   The patient is insured through [employer].   Per test claim: PA required; PA submitted to above mentioned insurance via Latent Key/confirmation #/EOC [id number] Status is pending

## 2024-06-24 NOTE — Telephone Encounter (Signed)
 Patient is aware to reach out to pcp will see dr wert in [place] will advise at visit

## 2024-06-24 NOTE — Telephone Encounter (Signed)
 FYI Only or Action Required?: Action required by provider: Pt is asking for advice. She has just finished prednisone  and is wondering if leg swelling is from stopping prednisone . Appt made for this afternoon  in [place] office.  Patient is followed in Pulmonology for , last seen on 06/21/2024 by Theodoro Lakes, MD.  Called Nurse Triage reporting Leg swelling  Symptoms began several days ago.  Interventions attempted: Nothing.  Symptoms are: unchanged.  Triage Disposition: See HCP Within 4 Hours (Or PCP Triage)  Patient/caregiver understands and will follow disposition?: Yes              E2C2 Pulmonary Triage - Initial Assessment Questions "Chief Complaint (e.g., cough, sob, wheezing, fever, chills, sweat or additional symptoms) *Go to specific symptom protocol after initial questions. Feet swelling knees swelling - red   "How long have symptoms been present?" Friday afternoon  Have you tested for COVID or Flu? Note: If not, ask patient if a home test can be taken. If so, instruct patient to call back for positive results. No  MEDICINES:   "Have you used any OTC meds to help with symptoms?" Yes If yes, ask "What medications?" IBU 800  "Have you used your inhalers/maintenance medication?" No If yes, "What medications?" no  If inhaler, ask "How many puffs and how often?" Note: Review instructions on medication in the chart. inhaler  OXYGEN: "Do you wear supplemental oxygen?" No If yes, "How many liters are you supposed to use?" NA  "Do you monitor your oxygen levels?" Yes If yes, What is your reading (oxygen level) today? ok  What is your usual oxygen saturation reading?  (Note: Pulmonary O2 sats should be 90% or greater) 99                   Copied from CRM #[id number]. Topic: Clinical - Red Word Triage >> Jun 24, 2024 11:39 AM Benton KIDD wrote: [facility] that prompted transfer to Nurse Triage: wondering if i can speak with dr pawar  nurse significant swelling in my legs and feet and it started friday afternoon my knees are red and so are my ankle and feet  cant even stand   Dr Theodoro on [address] in [place]  Prednisone  was November 2nd . Just seen dr pawar on the 14th of this month but it wasn't as bad while in there Reason for Disposition  [1] Thigh, calf, or ankle swelling AND [2] bilateral AND [3] 1 side is more swollen  Answer Assessment - Initial Assessment Questions 1. ONSET: When did the swelling start? (e.g., minutes, hours, days)     Friday 2. LOCATION: What part of the leg is swollen?  Are both legs swollen or just one leg?     Both right is much worse 3. SEVERITY: How bad is the swelling? (e.g., localized; mild, moderate, severe)     pitting 4. REDNESS: Is there redness or signs of infection?     redness 5. PAIN: Is the swelling painful to touch? If Yes, ask: How painful is it?   (Scale 1-10; mild, moderate or severe)     10/10 6. FEVER: Do you have a fever? If Yes, ask: What is it, how was it measured, and when did it start?      no 7. CAUSE: What do you think is causing the leg swelling?     Unsure 8. MEDICAL HISTORY: Do you have a history of blood clots (e.g., DVT), cancer, heart failure, kidney disease, or liver failure?  Several medical issues 9. RECURRENT SYMPTOM: Have you had leg swelling before? If Yes, ask: When was the last time? What happened that time?     yes 10. OTHER SYMPTOMS: Do you have any other symptoms? (e.g., chest pain, difficulty breathing)       no  Protocols used: Leg Swelling and Edema-A-AH

## 2024-06-24 NOTE — Telephone Encounter (Signed)
 Brand name vantin needing PA. Ordering generic cefpodoxime.

## 2024-06-24 NOTE — Telephone Encounter (Signed)
 Dr. Pawar, The patient wanted us  to let you know that the antibiotic you have prescribed is requiring a PA.  She wanted to know if you wanted to prescribed something else that does not require a PA.  Please advise.  Thank you.

## 2024-06-24 NOTE — Telephone Encounter (Signed)
 Your request has been approved PA Case: [id number], Status: Approved, Coverage Starts on: 06/24/2024 12:00:00 AM, Coverage Ends on: 06/24/2025 12:00:00 AM. Authorization Expiration11/17/2026

## 2024-06-24 NOTE — Patient Instructions (Addendum)
 Lasix  20 mg one daily as needed for swelling   For cough/ congestion > mucinex  or mucinex  dm  up to maximum of  1200 mg every 12 hours  as needed   Omnicef 300 mg twice daily x 10 days should turn the mucus clear.   The key is to stop smoking completely before smoking completely stops you!    Please remember to go to the lab department   for your tests - we will call you with the results when they are available.
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
## 2024-06-24 NOTE — Telephone Encounter (Signed)
 Patient is aware of pawar message will reach out to pcp,will keep appointment with dr.wert today.NFN

## 2024-06-24 NOTE — Telephone Encounter (Signed)
 Dr. Theodoro, Should the patient call her PCP regarding Leg swelling?  Please advise.  Thank you.

## 2024-06-25 ENCOUNTER — Ambulatory Visit: Payer: Self-pay | Admitting: Internal Medicine

## 2024-06-25 DIAGNOSIS — J441 Chronic obstructive pulmonary disease with (acute) exacerbation: Principal | ICD-10-CM

## 2024-06-25 DIAGNOSIS — M7989 Other specified soft tissue disorders: Secondary | ICD-10-CM | POA: Insufficient documentation

## 2024-06-25 DIAGNOSIS — F17210 Nicotine dependence, cigarettes, uncomplicated: Secondary | ICD-10-CM | POA: Insufficient documentation

## 2024-06-25 LAB — CBC WITH DIFFERENTIAL/PLATELET
Basophils Absolute: 0.1 x10E3/uL (ref 0.0–0.2)
Basos: 1 %
EOS (ABSOLUTE): 0.1 x10E3/uL (ref 0.0–0.4)
Eos: 1 %
Hematocrit: 41.7 % (ref 34.0–46.6)
Hemoglobin: 14.2 g/dL (ref 11.1–15.9)
Immature Grans (Abs): 0.3 x10E3/uL — ABNORMAL HIGH (ref 0.0–0.1)
Immature Granulocytes: 3 %
Lymphocytes Absolute: 2.2 x10E3/uL (ref 0.7–3.1)
Lymphs: 20 %
MCH: 35.7 pg — ABNORMAL HIGH (ref 26.6–33.0)
MCHC: 34.1 g/dL (ref 31.5–35.7)
MCV: 105 fL — ABNORMAL HIGH (ref 79–97)
Monocytes Absolute: 0.6 x10E3/uL (ref 0.1–0.9)
Monocytes: 5 %
Neutrophils Absolute: 7.8 x10E3/uL — ABNORMAL HIGH (ref 1.4–7.0)
Neutrophils: 70 %
Platelets: 247 x10E3/uL (ref 150–450)
RBC: 3.98 x10E6/uL (ref 3.77–5.28)
RDW: 14.5 % (ref 11.7–15.4)
WBC: 11 x10E3/uL — ABNORMAL HIGH (ref 3.4–10.8)

## 2024-06-25 LAB — HEPATIC FUNCTION PANEL
ALT: 26 IU/L (ref 0–32)
AST: 30 IU/L (ref 0–40)
Albumin: 4.4 g/dL (ref 3.9–4.9)
Alkaline Phosphatase: 104 IU/L (ref 41–116)
Bilirubin Total: <0.2 mg/dL (ref 0.0–1.2)
Bilirubin, Direct: <0.08 mg/dL (ref 0.00–0.40)
Total Protein: 6.4 g/dL (ref 6.0–8.5)

## 2024-06-25 LAB — BASIC METABOLIC PANEL WITH GFR
BUN/Creatinine Ratio: 13 (ref 9–23)
BUN: 14 mg/dL (ref 6–24)
CO2: 21 mmol/L (ref 20–29)
Calcium: 9.1 mg/dL (ref 8.7–10.2)
Chloride: 102 mmol/L (ref 96–106)
Creatinine, Ser: 1.07 mg/dL — ABNORMAL HIGH (ref 0.57–1.00)
Glucose: 84 mg/dL (ref 70–99)
Potassium: 4.3 mmol/L (ref 3.5–5.2)
Sodium: 139 mmol/L (ref 134–144)
eGFR: 67 mL/min/1.73 (ref >59)

## 2024-06-25 LAB — SEDIMENTATION RATE: Sed Rate: 10 mm/h (ref 0–32)

## 2024-06-25 LAB — TSH: TSH: 4.15 u[IU]/mL (ref 0.450–4.500)

## 2024-06-25 MED ORDER — ALBUTEROL SULFATE HFA 90 MCG/ACTUATION AEROSOL INHALER
0 refills | 0.00000 days
Start: 2024-06-25 — End: ?

## 2024-06-25 NOTE — Assessment & Plan Note (Addendum)
 3  min discussion re active cigarette smoking in addition to office E&M  Ask about tobacco use:   ongoing  Advise quitting   I took an extended  opportunity with this patient to outline the consequences of continued cigarette use  in airway disorders based on all the data we have from the multiple national lung health studies (perfomed over decades at millions of dollars in cost)  indicating that smoking cessation, not choice of inhalers or pulmonary physicians, is the most important aspect of her care and may result in rapid improvement in cough wants off completely   Assess willingness:  Not committed at this point Assist in quit attempt:  Per PCP when ready Arrange follow up:   Follow up per Primary Care planned           Each maintenance medication was reviewed in detail including emphasizing most importantly the difference between maintenance and prns and under what circumstances the prns are to be triggered using an action plan format where appropriate.  Total time for H and P, chart review, counseling, and generating customized AVS unique to this office visit / same day charting = 30 min with complex pt new to me

## 2024-06-25 NOTE — Assessment & Plan Note (Addendum)
 Worse since d/c prednisone  abruptly Jun 09 2024  and the focus of her concerns today but likely just a residual from taking prednisone . >>> rec lasx 20 mg per day prn  >>>  doe labs pending to include tsh/albumin

## 2024-06-25 NOTE — Assessment & Plan Note (Addendum)
 See admit 05/05/24 wit GG changes on CT   Agree with Dr Lavena ddx and treatment plan though I would add IBD and RA lung dz to the list  and if not improving consider rx with  depomedrol just to see which if any of her myriad symptoms don't improve (she refused to go back on pred anyway due to leg swelling) - since ESR so low ill hold the any further systemic steroids for now   Offered to see her back in 2 weeks if no avaialability in [place] office - call sooner if needed

## 2024-06-25 NOTE — Progress Notes (Signed)
 Called and relayed results to pt and she confirmed understanding. Pt had question to when she should be seeing results from starting lasix  I said to called back Thursday if she hasn't seen results and I will ask Dr wert

## 2024-06-26 DIAGNOSIS — K219 Gastro-esophageal reflux disease without esophagitis: Principal | ICD-10-CM

## 2024-06-26 MED ORDER — OMEPRAZOLE 40 MG CAPSULE,DELAYED RELEASE
ORAL_CAPSULE | Freq: Two times a day (BID) | ORAL | 2 refills | 30.00000 days
Start: 2024-06-26 — End: ?

## 2024-06-26 MED ORDER — ALBUTEROL SULFATE HFA 90 MCG/ACTUATION AEROSOL INHALER
0 refills | 0.00000 days
Start: 2024-06-26 — End: ?

## 2024-06-27 ENCOUNTER — Ambulatory Visit: Payer: Self-pay

## 2024-06-27 ENCOUNTER — Encounter: Payer: Self-pay | Admitting: Internal Medicine

## 2024-06-27 MED ORDER — OMEPRAZOLE 40 MG CAPSULE,DELAYED RELEASE
ORAL_CAPSULE | Freq: Two times a day (BID) | ORAL | 2 refills | 30.00000 days | Status: CP
Start: 2024-06-27 — End: ?

## 2024-06-27 MED FILL — ONDANSETRON 8 MG DISINTEGRATING TABLET: ORAL | 20 days supply | Qty: 60 | Fill #4

## 2024-06-27 MED FILL — CYANOCOBALAMIN (VIT B-12) 1,000 MCG/ML INJECTION SOLUTION: SUBCUTANEOUS | 28 days supply | Qty: 2 | Fill #0

## 2024-06-27 NOTE — Telephone Encounter (Signed)
 FYI Only or Action Required?: Action required by provider: clinical question for provider.  Patient is followed in Pulmonology, last seen on 06/24/2024 by Darlean Ozell NOVAK, MD.  Called Nurse Triage reporting Advice Only.  Symptoms are: unchanged.  Triage Disposition: Call PCP When Office is Open  Patient/caregiver understands and will follow disposition?: Yes            Copied from CRM #[id number]. Topic: Clinical - Red Word Triage >> Jun 27, 2024  4:33 PM Lavanda BIRCH wrote: Red Word that prompted transfer to Nurse Triage: Pain/Sweilling: Patient was seen on Monday and is still experiencing a lot of swelling and pain, she was told to advise if she doesn't see any improvement when taking her furosemide . She was told to take this medication as needed but was also a little uncertain how often she can take it and would like clarification. She also would like to see if Dr. Darlean would up the dose if necessary. Reason for Disposition  [1] Caller has NON-URGENT medicine question about med that PCP prescribed AND [2] triager unable to answer question  Answer Assessment - Initial Assessment Questions Pt was seen in office on Mon, 10/17, by Dr. Darlean for bilateral swelling in her legs, ankles, knees, and feet  Pt was started on furosemide  (Take 1 tablet (20 mg total) by mouth daily as needed), but does not see any improvement. Pt is wondering if she can have the dosage increased. This RN let pt know someone will call her back tmrw at [phone]  Pt told to call back with any new/worsening symptoms. Pt agreeable  Protocols used: Medication Question Call-A-AH

## 2024-06-27 NOTE — Telephone Encounter (Signed)
 Patient was seen on Monday and is still experiencing a lot of swelling and pain, she was told to advise if she doesn't see any improvement when taking her furosemide . She was told to take this medication as needed but was also a little uncertain how often she can take it and would like clarification. She also would like to see if Dr. Darlean would up the dose if necessary.   Please advise

## 2024-06-28 NOTE — Telephone Encounter (Signed)
 Copied from CRM #[id number]. Topic: Scheduling - New Appointment  >> Jun 28, 2024  4:17 PM Lacinda SAILOR wrote:  Caller asked to schedule a new appointment      Additional Requests/Needs: Yes Appointment Related: Reason of the Call:   Patient is requesting to schedule an appointment for knee-leg-ankle-feet swelling for Monday 11/24, nothing available, declined regional until she hears from [place] at [facility].      Does the caller want to be contacted regarding this request? Yes. Please contact The patient by Cell Phone Telephone Information:  Mobile          [phone]      Urgent callback turnaround time: within 24 business hours. Programmer, Systems Notified)

## 2024-06-28 NOTE — Telephone Encounter (Signed)
 Copied from CRM #[id number]. Topic: Clinical - Medication Question >> Jun 27, 2024  2:30 PM Dustin F wrote: Reason for CRM: Pt is calling to reach CMA Ladale Sherburn regarding her furosemide  (LASIX ) 20 MG tablet [[id number]]. She was told to report if the med is helping or not, but the pt stated she has not experienced a positive change when using furosemide  (LASIX ) 20 MG tablet [[id number]], and she would like to see about going ahead and upping the dosage.  Pleas call and confirm with the pt at [phone] ok to leave a vm.

## 2024-06-28 NOTE — Telephone Encounter (Signed)
 Relayed info to pt and she confirmed understanding

## 2024-06-28 NOTE — Telephone Encounter (Signed)
 See phone note

## 2024-07-01 ENCOUNTER — Other Ambulatory Visit

## 2024-07-02 ENCOUNTER — Other Ambulatory Visit

## 2024-07-10 ENCOUNTER — Ambulatory Visit: Admission: RE | Admit: 2024-07-10 | Discharge: 2024-07-10 | Disposition: A | Source: Ambulatory Visit

## 2024-07-10 DIAGNOSIS — R918 Other nonspecific abnormal finding of lung field: Secondary | ICD-10-CM

## 2024-07-15 ENCOUNTER — Ambulatory Visit: Payer: Self-pay

## 2024-07-16 ENCOUNTER — Other Ambulatory Visit: Payer: Self-pay | Admitting: Internal Medicine

## 2024-07-22 ENCOUNTER — Ambulatory Visit: Payer: Self-pay

## 2024-07-22 NOTE — Telephone Encounter (Signed)
 FYI Only or Action Required?: Action required by provider: med refill already sent in. .  Patient is followed in Pulmonology for DOE, last seen on 06/24/2024 by Darlean Ozell NOVAK, MD.  Called Nurse Triage reporting Leg Swelling.  Symptoms began ongoing.  Interventions attempted: Nothing.  Symptoms are: unchanged.  Triage Disposition: See Physician Within 24 Hours  Patient/caregiver understands and will follow disposition?: No, refuses disposition   Copied from CRM #[id number]. Topic: Clinical - Red Word Triage >> Jul 22, 2024 12:32 PM Corean SAUNDERS wrote: Red Word that prompted transfer to Nurse Triage: Swelling in legs, ankles, and feet, Seeking LASIX  refill. Reason for Disposition  [1] MODERATE leg swelling (e.g., swelling extends up to knees) AND [2] new-onset or getting worse  Answer Assessment - Initial Assessment Questions Pt states she was just needing us  to refill her lasix . She states her PCP won't refill it until she is seen in January. Rn advised it was filled last week.She states she was unaware. She is having increased leg swelling up to her knees. States she gained 40 pounds from prednisone  (when asked if she's gained weight). She has compression stockings on which help. Denies any chest pain, worse than normal shortness of breath. Did state she didn't need an appt at this time, has follow up and just needed refill. Rn did give advise on when to call back or go to the Er. Pt stated understanding.   1. ONSET: When did the swelling start? (e.g., minutes, hours, days)     ongoing 2. LOCATION: What part of the leg is swollen?  Are both legs swollen or just one leg?     Both feet/legs to the knees.  3. SEVERITY: How bad is the swelling? (e.g., localized; mild, moderate, severe)     moderate 4. CAUSE: What do you think is causing the leg swelling?     Unknown states she is a walking mystery 5. MEDICAL HISTORY: Do you have a history of blood clots (e.g., DVT), cancer,  heart failure, kidney disease, or liver failure?     denies 6. OTHER SYMPTOMS: Do you have any other symptoms? (e.g., chest pain, difficulty breathing)       denies  Protocols used: Leg Swelling and Edema-A-AH

## 2024-07-23 NOTE — Telephone Encounter (Signed)
 Patient has received lasix  refill.NFN as of now

## 2024-07-27 DIAGNOSIS — R1310 Dysphagia, unspecified: Principal | ICD-10-CM

## 2024-07-27 DIAGNOSIS — K219 Gastro-esophageal reflux disease without esophagitis: Principal | ICD-10-CM

## 2024-07-27 MED ORDER — OMEPRAZOLE 40 MG CAPSULE,DELAYED RELEASE
ORAL_CAPSULE | Freq: Two times a day (BID) | ORAL | 2 refills | 30.00000 days
Start: 2024-07-27 — End: ?

## 2024-07-27 MED ORDER — FAMOTIDINE 40 MG TABLET
ORAL_TABLET | Freq: Two times a day (BID) | ORAL | 3 refills | 45.00000 days
Start: 2024-07-27 — End: 2025-07-27

## 2024-07-27 MED ORDER — POTASSIUM CHLORIDE ER 20 MEQ TABLET,EXTENDED RELEASE(PART/CRYST)
ORAL_TABLET | Freq: Every day | ORAL | 5 refills | 30.00000 days
Start: 2024-07-27 — End: ?

## 2024-07-29 MED ORDER — OMEPRAZOLE 40 MG CAPSULE,DELAYED RELEASE
ORAL_CAPSULE | Freq: Two times a day (BID) | ORAL | 2 refills | 30.00000 days | Status: CP
Start: 2024-07-29 — End: ?

## 2024-07-30 MED FILL — CYANOCOBALAMIN (VIT B-12) 1,000 MCG/ML INJECTION SOLUTION: SUBCUTANEOUS | 28 days supply | Qty: 2 | Fill #1

## 2024-07-31 MED ORDER — FAMOTIDINE 40 MG TABLET
ORAL_TABLET | Freq: Two times a day (BID) | ORAL | 3 refills | 45.00000 days | Status: CP
Start: 2024-07-31 — End: 2025-07-31
  Filled 2024-08-02: qty 180, 90d supply, fill #0

## 2024-07-31 MED ORDER — POTASSIUM CHLORIDE ER 20 MEQ TABLET,EXTENDED RELEASE(PART/CRYST)
ORAL_TABLET | Freq: Every day | ORAL | 5 refills | 30.00000 days | Status: CP
Start: 2024-07-31 — End: ?
  Filled 2024-08-02: qty 90, 90d supply, fill #0

## 2024-08-14 ENCOUNTER — Telehealth (INDEPENDENT_AMBULATORY_CARE_PROVIDER_SITE_OTHER): Payer: Self-pay

## 2024-08-14 NOTE — Telephone Encounter (Signed)
 Copied from CRM #[id number]. Topic: Scheduling - New Appointment  >> Aug 14, 2024 11:47 AM Jon Sharps wrote:  Caller asked to schedule a new appointment      Additional Requests/Needs: Yes Appointment Related: Reason of the Call: pt has two appts request to come in earlier because of her health conditions    Requesting: call back    Supporting details:? Bronchitis with infection, migraines and GI issues    Appointment: The patient is scheduled for 1/16 & 2/3 .  I have added them to the wait list.      Does the caller want to be contacted regarding this request? Yes. Please contact The patient by Cell Phone Telephone Information:  Mobile          [phone]      Urgent callback turnaround time: within 24 business hours. Programmer, Systems Notified)

## 2024-08-14 NOTE — Telephone Encounter (Signed)
 I spoke with [facility] when they called office today at 1:33PM. They stated that patient had called them and got scheduled to get imaging done there, however the Prior Authorization from her insurance and referral was for [facility]. Could we change all that. I spoke to our referral team and they stated it would be best to cancel the [facility] appointment and for patient to schedule with [facility] since the  hasPrior Authorization been done for [facility]. I informed [facility] of this, Shemita was who called me from there. I called patient and informed her and gave her the phone number to call scheduling for [facility]. Patient stated that no one called her from [facility], that is why she called [facility]. I informed patient that if there is every a problem such as that, it is always better to call the providers office to let us  know so we can give her contact number and or check on the referral. Also since we have already gotten the Prior Authorization for [facility], it would have to be redone or the patient may have to pay for imaging out of pocket. Patient understood.

## 2024-08-16 DIAGNOSIS — K580 Irritable bowel syndrome with diarrhea: Principal | ICD-10-CM

## 2024-08-16 LAB — CBC W/ DIFFERENTIAL
BANDED NEUTROPHILS ABSOLUTE COUNT: 0.1 x10E3/uL (ref 0.0–0.1)
BASOPHILS ABSOLUTE COUNT: 0.1 x10E3/uL (ref 0.0–0.2)
BASOPHILS RELATIVE PERCENT: 1 %
EOSINOPHILS ABSOLUTE COUNT: 0.5 x10E3/uL — ABNORMAL HIGH (ref 0.0–0.4)
EOSINOPHILS RELATIVE PERCENT: 4 %
HEMATOCRIT: 42.5 % (ref 34.0–46.6)
HEMOGLOBIN: 14.3 g/dL (ref 11.1–15.9)
IMMATURE GRANULOCYTES: 1 %
LYMPHOCYTES ABSOLUTE COUNT: 2.4 x10E3/uL (ref 0.7–3.1)
LYMPHOCYTES RELATIVE PERCENT: 20 %
MEAN CORPUSCULAR HEMOGLOBIN CONC: 33.6 g/dL (ref 31.5–35.7)
MEAN CORPUSCULAR HEMOGLOBIN: 33.6 pg — ABNORMAL HIGH (ref 26.6–33.0)
MEAN CORPUSCULAR VOLUME: 100 fL — ABNORMAL HIGH (ref 79–97)
MONOCYTES ABSOLUTE COUNT: 0.7 x10E3/uL (ref 0.1–0.9)
MONOCYTES RELATIVE PERCENT: 6 %
NEUTROPHILS ABSOLUTE COUNT: 8.1 x10E3/uL — ABNORMAL HIGH (ref 1.4–7.0)
NEUTROPHILS RELATIVE PERCENT: 68 %
PLATELET COUNT (LABCORP): 365 x10E3/uL (ref 150–450)
RED BLOOD CELL COUNT: 4.25 x10E6/uL (ref 3.77–5.28)
RED CELL DISTRIBUTION WIDTH: 12.5 % (ref 11.7–15.4)
WHITE BLOOD CELL COUNT: 11.9 x10E3/uL — ABNORMAL HIGH (ref 3.4–10.8)

## 2024-08-16 LAB — COMPREHENSIVE METABOLIC PANEL
ALBUMIN: 4.2 g/dL (ref 3.9–4.9)
ALKALINE PHOSPHATASE: 138 IU/L — ABNORMAL HIGH (ref 41–116)
ALT (SGPT): 13 IU/L (ref 0–32)
AST (SGOT): 19 IU/L (ref 0–40)
BILIRUBIN TOTAL (MG/DL) IN SER/PLAS: <0.2 mg/dL (ref 0.0–1.2)
BLOOD UREA NITROGEN: 11 mg/dL (ref 6–24)
BUN / CREAT RATIO: 11 (ref 9–23)
CALCIUM: 9.3 mg/dL (ref 8.7–10.2)
CHLORIDE: 106 mmol/L (ref 96–106)
CO2: 17 mmol/L — ABNORMAL LOW (ref 20–29)
CREATININE: 0.96 mg/dL (ref 0.57–1.00)
EGFR: 75 mL/min/1.73
GLOBULIN, TOTAL: 2.1 g/dL (ref 1.5–4.5)
GLUCOSE: 86 mg/dL (ref 70–99)
POTASSIUM: 4 mmol/L (ref 3.5–5.2)
SODIUM: 137 mmol/L (ref 134–144)
TOTAL PROTEIN: 6.3 g/dL (ref 6.0–8.5)

## 2024-08-16 LAB — VITAMIN B12: VITAMIN B-12: 1565 pg/mL — ABNORMAL HIGH (ref 232–1245)

## 2024-08-16 LAB — IRON & TIBC
IRON SATURATION: 7 % — CL (ref 15–55)
IRON: 21 ug/dL — ABNORMAL LOW (ref 27–159)
TOTAL IRON BINDING CAPACITY: 293 ug/dL (ref 250–450)
UNSATURATED IRON BINDING CAPACITY: 272 ug/dL (ref 131–425)

## 2024-08-16 LAB — FOLATE: FOLATE: 7.8 ng/mL

## 2024-08-16 LAB — C-REACTIVE PROTEIN: C-REACTIVE PROTEIN: 179 mg/L — ABNORMAL HIGH (ref 0–10)

## 2024-08-16 LAB — FERRITIN: FERRITIN: 114 ng/mL (ref 15–150)

## 2024-08-16 MED ORDER — RIFAXIMIN 550 MG TABLET
ORAL_TABLET | Freq: Three times a day (TID) | ORAL | 0 refills | 14.00000 days | Status: CP
Start: 2024-08-16 — End: 2024-08-30
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
## 2024-08-20 ENCOUNTER — Encounter: Admit: 2024-08-20 | Discharge: 2024-08-21 | Payer: Medicaid (Managed Care)

## 2024-08-20 DIAGNOSIS — R062 Wheezing: Principal | ICD-10-CM

## 2024-08-20 DIAGNOSIS — E66811 Class 1 obesity: Principal | ICD-10-CM

## 2024-08-20 DIAGNOSIS — K219 Gastro-esophageal reflux disease without esophagitis: Principal | ICD-10-CM

## 2024-08-20 DIAGNOSIS — R278 Other lack of coordination: Principal | ICD-10-CM

## 2024-08-20 DIAGNOSIS — Z7689 Persons encountering health services in other specified circumstances: Principal | ICD-10-CM

## 2024-08-20 DIAGNOSIS — K50818 Crohn's disease of both small and large intestine with other complication: Principal | ICD-10-CM

## 2024-08-20 DIAGNOSIS — K909 Intestinal malabsorption, unspecified: Principal | ICD-10-CM

## 2024-08-20 DIAGNOSIS — R197 Diarrhea, unspecified: Principal | ICD-10-CM

## 2024-08-20 DIAGNOSIS — J0141 Acute recurrent pansinusitis: Principal | ICD-10-CM

## 2024-08-20 DIAGNOSIS — G43719 Chronic migraine without aura, intractable, without status migrainosus: Principal | ICD-10-CM

## 2024-08-20 MED ORDER — ALBUTEROL SULFATE HFA 90 MCG/ACTUATION AEROSOL INHALER
RESPIRATORY_TRACT | 0 refills | 0.00000 days | Status: CP
Start: 2024-08-20 — End: ?

## 2024-08-20 MED ORDER — COLESTIPOL 1 GRAM TABLET
ORAL_TABLET | Freq: Two times a day (BID) | ORAL | 3 refills | 30.00000 days | Status: CN
Start: 2024-08-20 — End: ?

## 2024-08-20 MED ORDER — DOXYCYCLINE HYCLATE 100 MG CAPSULE
ORAL_CAPSULE | Freq: Two times a day (BID) | ORAL | 0 refills | 7.00000 days | Status: CP
Start: 2024-08-20 — End: 2024-08-27

## 2024-08-20 MED ORDER — ESOMEPRAZOLE MAGNESIUM 40 MG CAPSULE,DELAYED RELEASE
ORAL_CAPSULE | Freq: Two times a day (BID) | ORAL | 3 refills | 90.00000 days | Status: CN
Start: 2024-08-20 — End: 2025-08-20

## 2024-08-20 MED ORDER — SUMATRIPTAN 25 MG TABLET
ORAL_TABLET | ORAL | 0 refills | 0.00000 days | Status: CP
Start: 2024-08-20 — End: ?
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
## 2024-08-20 NOTE — Patient Instructions (Signed)
 [facility]    Appointments: [phone]  Nurse Contact: Larraine Patrick, RN [phone]   Fax: [phone]        Here are my recommendations:    Upper endoscopy and colonoscopy ordered, please call to schedule [phone] option 1 then option 2    2.   Try colestipol  1 tablet twice a day to see if it helps with for diarrhea.    3.    Recommend pelvic PT with biofeedback for pelvic floor weakness      Nurse Contact:   Larraine Patrick, RN: [phone]     Important phone numbers:  GI Clinic Appointments:  [phone] option 1 then option 1  GI Procedure Appointments:  [phone] option 1 then option 2  Radiology: [phone]   Financial Counselor: [phone]
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
## 2024-08-20 NOTE — Progress Notes (Signed)
 Colonoscopy  Procedure #1   EGD  Procedure #2   [id number]  MRN   Ritaccio  Endoscopist     Is the patient's health insurance [employer], [employer], United Healthcare (UHC), UHC [place], [employer], or Cigna?     Urgent procedure     Are you pregnant? (Ignore if [facility] GI provider has OK'd procedure in order comments despite pregnancy)     Do you have chest pain with physical activity or are you in the process of scheduling or awaiting results of a heart ultrasound, stress test, or catheterization to evaluate chest pain, dizziness, or shortness of breath?     Do you have achalasia or gastroparesis or take Mounjaro, Zepbound, [place], Trulicity, Ozempic, Victoza, Saxenda, Byetta, Bydureon, Rybelsus, or Adlyxin?      Do you take: Plavix (clopidogrel), Coumadin (warfarin), Lovenox (enoxaparin), Pradaxa (dabigatran), Effient (prasugrel), Xarelto (rivaroxaban), Eliquis (apixaban), Pletal (cilostazol), or Brilinta (ticagrelor)?          Did ordering provider indicate how long to hold this medication in the order comments?          Which of the above medications are you taking?          What is the name of the medical practice that manages this medication?          What is the name of the medical provider who manages this medication?     Do you have hemophilia, von Willebrand disease, or low platelets?     Do you have a pacemaker or implanted cardiac defibrillator?     Has a [place] GI provider specified the location(s)?     Which location(s) did the [facility] provider specify?          [facility]          [place]          [facility]     Do you see a liver specialist for chronic liver disease?     Is the procedure indication for variceal screening?     Is procedure indication for variceal banding (this does NOT include variceal screening)?     Have you had a heart attack, stroke or heart stent placement within the past 6 months?     Month of event     Year of event (ONLY ENTER LAST 2 DIGITS)        5  Height (feet) 2.5  Height (inches)   165  Weight (pounds)   29.7  BMI          Did the ordering provider specify a bowel prep?          What bowel prep was specified?     Do you have an ostomy (bag on your stomach that collects your stool)?          Is it an ileostomy?          Is it a colostomy?          Patient doesn't know.     Do you have chronic kidney disease?     Do you have chronic constipation or have you had poor quality bowel preps for past colonoscopies?   TRUE  Do you have Crohn's disease or ulcerative colitis?     Have you had weight loss surgery?          Do you ever use supplemental oxygen ?     Have you been hospitalized for cirrhosis of the liver or heart failure in the last 12  months?     Have you been treated for mouth or throat cancer with radiation or surgery?     Have you been told that it is difficult for doctors to insert a breathing tube in you during anesthesia?     Have you had, or are you being considered for, a heart or lung transplant?          Are you on dialysis?     Have you started dialysis in the last 6 months?     Do you have cirrhosis of the liver?     Do you have myasthenia gravis?     Is the patient a prisoner?          Do you have obstructive sleep apnea?   TRUE  Have you previously received propofol sedation by an anesthesiologist for a GI procedure?     Do you have more than 3 drinks of alcohol per day on average?     Do you regularly take prescription medications for chronic pain?     Do you regularly take Ativan , Klonopin , Xanax , Valium , lorazepam , clonazepam , alprazolam , or diazepam ?     Have you previously had difficulty with sedation during a GI procedure?     Are you allergic to fentanyl, midazolam, or Versed?     Do you take medications for HIV?   ################# ## ###################################################################################################################   MRN:  [id number]   Anticoag Review  No   Nurse Triage  No   Procedure(s):  Colonoscopy     EGD Endoscopist:  Ritaccio   Urgent:  No   Prep:  Miralax  Prep                            --------------------------- --- ----------------------------------------------------------------------------------------------------------------------------------------------------------------------------   [facility] Locations:  [facility]     [facility]     [place]             Requested Locations:               ################# ## ###################################################################################################################

## 2024-08-21 ENCOUNTER — Other Ambulatory Visit

## 2024-08-21 MED ORDER — PEG 3350-ELECTROLYTES 236 GRAM-22.74 GRAM-6.74 GRAM-5.86 GRAM SOLUTION
0 refills | 0.00000 days | Status: CP
Start: 2024-08-21 — End: ?

## 2024-08-22 ENCOUNTER — Encounter
Admit: 2024-08-22 | Discharge: 2024-08-22 | Payer: Medicaid (Managed Care) | Attending: Student in an Organized Health Care Education/Training Program | Primary: Student in an Organized Health Care Education/Training Program

## 2024-08-22 DIAGNOSIS — L089 Local infection of the skin and subcutaneous tissue, unspecified: Principal | ICD-10-CM

## 2024-08-22 NOTE — Progress Notes (Signed)
 Dermatology Note     Assessment and Plan:      Assessment & Plan  Chronic painful skin lesion of the R lower extremity   Lesion biopsied at last visit and was inconclusive, showed only stasis - Dermal pseudocystic change with fibrin, sparse inflammation, and focal lipomembranous change.  Treated with doxycycline  with no improvement. Aerobic culture surface swab <1 candida. Discussed diagnosis unclear. Joint decision to repeat h&e and obtain tissue cultures today. Defer management until we have results.   - Dermatopathology Order  - AFB culture  - Aerobic Culture  - Fungal ([place]) Pathogen Culture  Biopsy (Punch) Procedure Note:   After discussion of risks, benefits, and alternatives including but not limited to risk of scarring, recurrence of lesion, and potential need for other therapy depending on diagnosis, consent was obtained. Time Out verification of patient, procedure, and site was performed. When applicable, safety precautions based on patient's medical history or medication use and review of relevant images and results was also performed as part of the Time Out. The area was prepped with alcohol and anesthetized with lidocaine  2% with epinephrine. Biopsy was performed using a punch technique with a 4 and 8 mm punch. Hemostasis and closure were achieved in the usual fashion with pressure and 4-0 nylon suture. Bandage was applied and instructions were provided. We will contact the patient with results when available. Patient agrees to be notified of results by MyChart - even if needs further management.   A) Location: R lower leg x 2 (one for h&e and one for tissue cultures) ; Ddx: rule out infection vs panniculitis vs other  Suture removal plan: 2 weeks at home, suture removal kit provided      The patient was advised to call for an appointment should any new, changing, or symptomatic lesions develop.     RTC: Return in about 4 weeks (around 09/19/2024). or sooner as needed _________________________________________________________________      Chief Complaint     Chief Complaint   Patient presents with    OTHER     Per pt biopsy site still not healed on right leg and swelling. Per pt spot on right foot. Redness of elbows & knees.        HPI     Mary Hawkins is a 44 y.o. female who presents as a returning patient (last seen No previous visit found) to Dermatology for follow up of leg lesion.     History of Present Illness  Mary Hawkins is a 44 year old female who presents with a painful spot on her leg for follow-up.    She has persistent severe pain at a spot on her leg that prevents her from lying down comfortably. The area was treated with antibiotics and biopsied on July 31, with a culture in September, but there has been little improvement. The lesion began as a raised, red, swollen area with fluid retention and has spread to nearby skin. She applies Aquaphor and keeps it covered.    She underwent skin biopsies for peripheral neuropathy on May 14 at [facility] in [place]. Within two days she developed a large, very painful rash or bump at the site. Antibiotics did not help and healing has been very slow.    She has chronic swelling and fluid retention in her legs and feet, present for a couple of years and worse in the past week, which limits standing. She takes nifedipine  and has recently been off prednisone.    She has  Crohn's disease and stopped Skyrizi  on October 28 to allow the leg lesion to heal.      The patient denies any other new or changing lesions or areas of concern.     Pertinent Past Medical History     No history of skin cancer  Crohns disease, on skyrizi  (but has been holding injections)    Past Medical History, Family History, Social History, Medication List, Allergies, and Problem List were reviewed in the rooming section of Epic.     ROS: Other than symptoms mentioned in the HPI, no fevers, chills, or other skin complaints    Physical Examination     GENERAL: Well-appearing female in no acute distress, resting comfortably.  NEURO: Alert and oriented, answers questions appropriately  PSYCH: Normal mood and affect  RESP: No increased work of breathing  SKIN (Focal Skin Exam): Per patient request, examination of lower legs was performed  - erythematous very tender nodule on the R lateral leg   - mild non-pitting edema of the lower legs    All areas not commented on are within normal limits or unremarkable    (Approved Template 09/09/2023)

## 2024-08-23 ENCOUNTER — Ambulatory Visit (HOSPITAL_COMMUNITY)

## 2024-08-24 DIAGNOSIS — Z720 Tobacco use: Principal | ICD-10-CM

## 2024-08-24 MED ORDER — NICOTINE 21 MG/24 HR DAILY TRANSDERMAL PATCH
MEDICATED_PATCH | 2 refills | 0.00000 days
Start: 2024-08-24 — End: ?

## 2024-08-25 ENCOUNTER — Ambulatory Visit (HOSPITAL_BASED_OUTPATIENT_CLINIC_OR_DEPARTMENT_OTHER): Admission: RE | Admit: 2024-08-25 | Source: Ambulatory Visit

## 2024-08-27 MED ORDER — NICOTINE 21 MG/24 HR DAILY TRANSDERMAL PATCH
MEDICATED_PATCH | 2 refills | 0.00000 days | Status: CP
Start: 2024-08-27 — End: ?

## 2024-08-27 NOTE — Telephone Encounter (Signed)
 Patient is requesting the following refill  Requested Prescriptions     Pending Prescriptions Disp Refills    nicotine  (NICODERM CQ ) 21 mg/24 hr patch [Pharmacy Med Name: NICOTINE  21 MG/24HR PATCH] 28 patch 2     Sig: APPLY 1 PATCH EVERY DAY       Recent Visits  Date Type Provider Dept   08/20/24 Office Visit Alyse Slater Pao, MD [place] [facility] [address] At [facility]   08/20/24 Telemedicine Ritaccio, Marry BRAVO, MD [facility]   06/11/24 Telemedicine Ritaccio, Marry BRAVO, MD [facility]   06/04/24 Office Visit Alyse Slater Pao, MD [place] [facility] [address] At [facility]   05/24/24 Telemedicine Ritaccio, Marry BRAVO, MD [facility]   04/12/24 Office Visit Alyse Slater Pao, MD [place] [facility] [address] At [facility]   04/09/24 Office Visit Ritaccio, Marry BRAVO, MD [facility] [place]   04/04/24 Office Visit Quintin Stephane BRAVO, NP [place] [facility] [address] At [facility]   12/05/23 Office Visit Bobby Suzen Lambing, MD [facility] [place]   11/30/23 Office Visit Alyse Slater Pao, MD [place] [facility] [address] At [facility]   Showing recent visits within past 365 days and meeting all other requirements  Future Appointments  Date Type Provider Dept   01/29/25 Appointment Alyse Slater Pao, MD [place] [facility] [address] At [facility]   Showing future appointments within next 365 days and meeting all other requirements           Encounter for refill request:    Colonoscopy:   Appointments which have been scheduled for you      Sep 05, 2024 1:00 PM  (Arrive by 12:45 PM)  PELVIC FLOOR EVAL with Sari GORMAN Minder, PT  [facility] PHYSICAL THERAPY [place] [facility] [place]) [address] [place] [address]  [phone]        Sep 19, 2024 10:45 AM  (Arrive by 10:30 AM)  RETURN GENERAL with Rollene CHRISTELLA Eck, MD  [facility] [place] [facility] [place]) [address]  [place] [address]  [phone]        Oct 14, 2024 11:30 AM  (Arrive by 11:15 AM)  PFT with PFT 5  [facility] PULMONARY SPECIALTY FUNCT [place] [address]  [place] 1 through 4  [place] [address]  [phone]   If you have inhaled breathing medications, please do not use it 4 hours prior to this breathing test.  Please wear comfortable clothing and well-fitting , closed toe shoes in the even that a 6-minute walk is part of your test.  If you have fallen in the past month please inform your respiratory therapist at the start of your appointment.    While you may bring a guest to your appointment, they will not be able to be present in the testing area.       Oct 14, 2024 1:30 PM  (Arrive by 1:15 PM)  NEW PULMONARY with Mylene Antonio, MD  [facility] PULMONARY SPECIALTY CL [place] [facility] REGION) [address]  [facility] 1 through 4  [place] [address]  [phone]        Dec 03, 2024 10:40 AM  (Arrive by 10:25 AM)  SHERIDA TEST with MEDDDN MOTILITY NURSE  [facility] GI MEDICINE  [place] [facility] REGION) [address]  [facility] 1 through 4  [place] [address]  [phone]        Jan 29, 2025 10:40 AM  (Arrive by 10:25 AM)  PHYSICAL EXAM with Slater Darice Sole, MD  [facility] PRIMARY CARE [address] AT [facility] REGION) [address] [place]  [address]  [phone]   Arrive 15 minutes prior to appointment.            Lab Results   Component Value Date    WBC 11.9 (H) 08/15/2024    RBC 4.25 08/15/2024    HGB 14.3 08/15/2024    HCT 42.5 08/15/2024    PLT 365 08/15/2024    ALT 13 08/15/2024    AST 19 08/15/2024    ALKPHOS 138 (H) 08/15/2024    CRP 179 (H) 08/15/2024    CREATININE 0.96 08/15/2024       refills authorized x

## 2024-09-02 DIAGNOSIS — A311 Cutaneous mycobacterial infection: Secondary | ICD-10-CM

## 2024-09-02 DIAGNOSIS — L089 Local infection of the skin and subcutaneous tissue, unspecified: Principal | ICD-10-CM

## 2024-09-02 DIAGNOSIS — R197 Diarrhea, unspecified: Principal | ICD-10-CM

## 2024-09-02 DIAGNOSIS — G43719 Chronic migraine without aura, intractable, without status migrainosus: Secondary | ICD-10-CM

## 2024-09-02 DIAGNOSIS — J0141 Acute recurrent pansinusitis: Principal | ICD-10-CM

## 2024-09-02 MED ORDER — XIFAXAN 550 MG TABLET
ORAL_TABLET | 0 refills | 0.00000 days
Start: 2024-09-02 — End: ?

## 2024-09-02 MED ORDER — SUMATRIPTAN 25 MG TABLET
ORAL_TABLET | ORAL | 0 refills | 0.00000 days | Status: CP
Start: 2024-09-02 — End: ?

## 2024-09-02 MED ORDER — DOXYCYCLINE HYCLATE 100 MG CAPSULE
ORAL_CAPSULE | Freq: Two times a day (BID) | ORAL | 0 refills | 7.00000 days
Start: 2024-09-02 — End: 2024-09-09

## 2024-09-02 NOTE — Progress Notes (Signed)
 Patient is requesting the following refill  Requested Prescriptions     Pending Prescriptions Disp Refills    sumatriptan  (IMITREX ) 25 MG tablet 10 tablet 0     Sig: Take one tablet at the onset of headache and may repeat in 2 hrs if headache still present. Max dose 50 mg /day.    doxycycline  (VIBRAMYCIN ) 100 MG capsule 14 capsule 0     Sig: Take 1 capsule (100 mg total) by mouth two (2) times a day for 7 days.       Recent Visits  Date Type Provider Dept   08/20/24 Office Visit Alyse Slater Pao, MD [place] [facility] [address] At [facility]   06/04/24 Office Visit Alyse Slater Pao, MD [place] [facility] [address] At [facility]   04/12/24 Office Visit Alyse Slater Pao, MD [place] [facility] [address] At [facility]   04/04/24 Office Visit Quintin Stephane BRAVO, NP [place] [facility] [address] At [facility]   11/30/23 Office Visit Alyse Slater Pao, MD [place] [facility] [address] At [facility]   Showing recent visits within past 365 days and meeting all other requirements  Future Appointments  Date Type Provider Dept   01/29/25 Appointment Alyse Slater Pao, MD [place] [facility] [address] At [place] [facility]   Showing future appointments within next 365 days and meeting all other requirements       Labs: Not applicable this refill

## 2024-09-03 DIAGNOSIS — Z7689 Persons encountering health services in other specified circumstances: Principal | ICD-10-CM

## 2024-09-03 DIAGNOSIS — E66811 Class 1 obesity: Secondary | ICD-10-CM

## 2024-09-04 ENCOUNTER — Ambulatory Visit (HOSPITAL_COMMUNITY)

## 2024-09-04 DIAGNOSIS — K50819 Crohn's disease of both small and large intestine with unspecified complications: Principal | ICD-10-CM

## 2024-09-04 DIAGNOSIS — K50018 Crohn's disease of small intestine with other complication: Secondary | ICD-10-CM

## 2024-09-04 MED ORDER — GUSELKUMAB 200 MG/2 ML SUBCUTANEOUS PEN INJECTOR
SUBCUTANEOUS | 5 refills | 0.00000 days | Status: CP
Start: 2024-09-04 — End: ?

## 2024-09-04 MED ORDER — XIFAXAN 550 MG TABLET
ORAL_TABLET | ORAL | 0 refills | 0.00000 days | Status: CP
Start: 2024-09-04 — End: ?

## 2024-09-04 NOTE — Telephone Encounter (Signed)
 MEDICATION ORDER ENTRY DOCUMENTATION    The following medication(s) have been sent to pharmacy for insurance authorization:    Medication: guselkumab   Dose & Frequency: 400 mg SQ at weeks 0, 4, and 8; followed by 200 mg SQ every 4 weeks  Pharmacy: [facility]       Requesting Physician: Dr. Marry Ritaccio    Notes:    Discontinue skyrizi . Start tremfya 

## 2024-09-05 DIAGNOSIS — R197 Diarrhea, unspecified: Principal | ICD-10-CM

## 2024-09-05 DIAGNOSIS — A311 Cutaneous mycobacterial infection: Principal | ICD-10-CM

## 2024-09-05 MED ORDER — AZITHROMYCIN 500 MG TABLET
ORAL_TABLET | Freq: Two times a day (BID) | ORAL | 0 refills | 30.00000 days | Status: CP
Start: 2024-09-05 — End: 2024-10-05

## 2024-09-05 NOTE — Addendum Note (Signed)
 Addended by: CANDA ROLLENE HERO on: 09/05/2024 01:59 PM     Modules accepted: Orders

## 2024-09-05 NOTE — Telephone Encounter (Signed)
 Patient request rx to be sent to this [facility] location.

## 2024-09-06 NOTE — Telephone Encounter (Signed)
 Tremfya  PA denied due to Crohn's disease not a covered indication but UC  per patient's Medicaid plan. Requested peer to peer to inform payor that Tremfya  is now approved for Crohn's as well. Awaiting call back.    Dewayne Motley, PharmD, BCPS, CPP  Clinical Pharmacist Practitioner, GI/IBD  [phone]

## 2024-09-06 NOTE — Progress Notes (Unsigned)
 2/3: Mailbox full - tried calling patient back to let her know that GI clinic gives okay for pt to start as low infection risk but also okay waiting to start until after upcoming appointments - JS  ***Incomplete - 2/2: pt wanted me to check with GI regarding if pt should start or if she should have a break in therapies due to other health issues      [facility]    Patient Onboarding/Medication Counseling    Mary Hawkins is a 44 y.o. female with Crohn's who I am counseling today on initiation of therapy.  I am speaking to {Blank:19197::the patient,the patient's caregiver, ***,the patient's family member, ***,***}.    Was a nurse, learning disability used for this call? No    Verified patient's date of birth / HIPAA.    Specialty medication(s) to be sent: Inflammatory Disorders: Tremfya       Non-specialty medications/supplies to be sent: ***sharps      Medications not needed at this time: n/a         Tremfya  (guselkumab )    Medication & Administration     Dosage: Crohn's: Inject 2 pens (400 mg in total) at weeks 0, 4, & 8 and then start maintenance dose of 200 mg every 4 weeks beginning at week 12.    Lab tests required prior to treatment initiation:  Tuberculosis: {Blank single:19197::Tuberculosis screening resulted in a non-reactive Quantiferon TB Gold assay,Tuberculosis screening resulted in a non-reactive TB skin test,Tuberculosis screening not documented in the patient's chart but medication prescriber has indicated they are aware and wishing to initiate treatment at this time,***}.    Administration:   Systems Developer all supplies needed for injection on a clean, flat working surface: medication pen removed from packaging, alcohol swab, sharps container, etc.  Look at the medication label - look for correct medication, correct dose, and check the expiration date  Look at the medication - the liquid visible in the window on the side of the pen device should appear clear and colorless to slightly yellow and may contain tiny white or clear particles  Lay the auto-injector pen on a flat surface and allow it to warm up to room temperature for at least 30 minutes  Select injection site - you can use the front of your thigh or your belly (but not the area 2 inches around your belly button); if someone else is giving you the injection you can also use your upper arm in the skin covering your triceps muscle  Prepare injection site - wash your hands and clean the skin at the injection site with an alcohol swab and let it air dry, do not touch the injection site again before the injection  Pull off the bottom safety cap, do not remove until immediately prior to injection and do not touch the yellow needle cover  Position the injector against your skin at the injection site at a 90 degree angle, hold the pen such that you can see the clear medication window  Keep holding the prefilled pen against the skin for about 10 seconds to hear a second click and confirm injection is complete when the plunger rod stops moving and fills the viewing window, lift straight up  If prescribed dose requires two injections repeat above steps with second prefilled pen  Dispose of the used auto-injector pen immediately in your sharps disposal container the needle will be covered automatically  If you see any blood at the injection site, press  a cotton ball or gauze on the site and maintain pressure until the bleeding stops, do not rub the injection site      Adherence/Missed dose instructions:  If your injection is given more than 7 days after your scheduled injection date - consult your pharmacist for additional instructions on how to adjust your dosing schedule.    Goals of Therapy     Crohn's disease  Achieve remission of symptoms  Maintain remission of symptoms  Minimize long-term systemic glucocorticoid use  Prevent need for surgical procedures  Maintenance of effective psychosocial functioning      Side Effects & Monitoring Parameters     Injection site reaction (redness, irritation, inflammation localized to the site of administration)  Signs of a common cold - minor sore throat, runny or stuffy nose, etc.  Joint pain  Headache    The following side effects should be reported to the provider:  Signs of a hypersensitivity reaction - rash; hives; itching; red, swollen, blistered, or peeling skin; wheezing; tightness in the chest or throat; difficulty breathing, swallowing, or talking; swelling of the mouth, face, lips, tongue, or throat; etc.  Reduced immune function - report signs of infection such as fever; chills; body aches; very bad sore throat; ear or sinus pain; cough; more sputum or change in color of sputum; pain with passing urine; wound that will not heal, etc.  Also at a slightly higher risk of some malignancies (mainly skin and blood cancers) due to this reduced immune function.  In the case of signs of infection - the patient should hold the next dose of Tremfya ?? and call your primary care provider to ensure adequate medical care.  Treatment may be resumed when infection is treated and patient is asymptomatic.  Cough with or without phlegm or blood  Shortness of breath  Stomach pain or diarrhea      Contraindications, Warnings, & Precautions     Have your bloodwork checked as you have been told by your prescriber  Talk with your doctor if you are pregnant, planning to become pregnant, or breastfeeding  Discuss the possible need for holding your dose(s) of Tremfya ?? when a planned procedure is scheduled with the prescriber as it may delay healing/recovery timeline       Drug/Food Interactions     Medication list reviewed in Epic. The patient was instructed to inform the care team before taking any new medications or supplements. {Blank single:19197::No drug interactions identified,***}.   Talk with you prescriber or pharmacist before receiving any live vaccinations while taking this medication and after you stop taking it    Storage, Handling Precautions, & Disposal     Store this medication in the refrigerator.  Do not freeze  If needed, you may store at room temperature for up to 4 hours  Store in original packaging, protected from light  Do not shake  Dispose of used syringes/pens in a sharps disposal container      Current Medications (including OTC/herbals), Comorbidities and Allergies     Current Medications[1]    Allergies[2]    Problem List[3]    Medication list has been reviewed and updated in Epic: {Blank single:19197::Yes,***}    Allergies have been reviewed and updated in Epic: {Blank single:19197::Yes,***}    Appropriateness of Therapy     Acute infections noted within Epic:  No active infections  Patient reported infection: {Blank single:19197::None,***- patient reported to provider,***- pharmacy reported to provider}    Is the medication and dose appropriate considering the  patient???s diagnosis, treatment, and disease journey, comorbidities, medical history, current medications, allergies, therapeutic goals, self-administration ability, and access barriers? {Blank single:19197::Yes,No - evidence provided by prescriber in *** note}    Prescription has been clinically reviewed: {Blank single:19197::Yes,***}      Baseline Quality of Life Assessment      {DiseaseSpecificQOL:73897}    Financial Information     Medication Assistance provided: {sscmedassist:65303}    Anticipated copay of $*** reviewed with patient. Verified delivery address.    Delivery Information     Scheduled delivery date: ***    Expected start date: ***      Medication will be delivered via {Blank:19197::UPS,Next Day Courier,Clinic Courier - *** clinic,***} to the {Blank:19197::prescription,temporary} address in Epic WAM.  This shipment {Blank single:19197::will,will not} require a signature.      Explained the services we provide at [facility] and that each month we would call to set up refills.  Stressed importance of returning phone calls so that we could ensure they receive their medications in time each month.  Informed patient that we should be setting up refills 7-10 days prior to when they will run out of medication.  A pharmacist will reach out to perform a clinical assessment periodically.  Informed patient that a welcome packet, containing information about our pharmacy and other support services, a Notice of Privacy Practices, and a drug information handout will be sent.      The patient or caregiver noted above participated in the development of this care plan and knows that they can request review of or adjustments to the care plan at any time.      Patient or caregiver verbalized understanding of the above information as well as how to contact the pharmacy at [phone] option 4 with any questions/concerns.  The pharmacy is open Monday through Friday 8:30am-4:30pm.  A pharmacist is available 24/7 via pager to answer any clinical questions they may have.    Patient Specific Needs     Does the patient have any physical, cognitive, or cultural barriers? {Blank single:19197::No,Yes - ***}    Does the patient have adequate living arrangements? (i.e. the ability to store and take their medication appropriately) {Blank single:19197::Yes,No - ***}    Did you identify any home environmental safety or security hazards? {Blank single:19197::No,Yes - ***}    Patient prefers to have medications discussed with  {Blank single:19197::Patient,Family Member,Caregiver,Other}     Is the patient or caregiver able to read and understand education materials at a high school level or above? {Blank single:19197::No,Yes}    Patient's primary language is  {Blank single:19197::English,Spanish,***}     Is the patient high risk? {sschighriskpts:78327}    Does the patient have an additional or emergency contact listed in their chart? {Blank single:19197::Yes,No, patient refused.,***}    SOCIAL DETERMINANTS OF HEALTH     At the [facility], we have learned that life circumstances - like trouble affording food, housing, utilities, or transportation can affect the health of many of our patients.   That is why we wanted to ask: are you currently experiencing any life circumstances that are negatively impacting your health and/or quality of life? {YES/NO/PATIENTDECLINED:93004}    Social Drivers of Health     Food Insecurity: No Food Insecurity (05/06/2024)    Received from [facility]    Hunger Vital Sign     Within the past 12 months, you worried that your food would run out before you got the money to buy more.:  Never true     Within the past 12 months, the food you bought just didn't last and you didn't have money to get more.: Never true   Tobacco Use: High Risk (08/21/2024)    Received from [facility]    Patient History     Smoking Tobacco Use: Every Day     Smokeless Tobacco Use: Never     Passive Exposure: Past   Transportation Needs: No Transportation Needs (05/06/2024)    Received from [facility] - Transportation     In the past 12 months, has lack of transportation kept you from medical appointments or from getting medications?: No     In the past 12 months, has lack of transportation kept you from meetings, work, or from getting things needed for daily living?: No   Alcohol Use: Not At Risk (02/19/2024)    Received from [facility]    Alcohol     Audit-C Score: 0   Housing: Patient Declined (01/10/2024)    Received from [facility] Stability Vital Sign     In the last 12 months, was there a time when you were not able to pay the mortgage or rent on time?: Patient declined     Number of Times Moved in the Last Year: Not on file     At any time in the past 12 months, were you homeless or living in a shelter (including now)?: Patient declined   Physical Activity: Unknown (01/10/2024)    Received from [facility]    Exercise Vital Sign     On average, how many days per week do you engage in moderate to strenuous exercise (like a brisk walk)?: Patient declined     Minutes of Exercise per Session: Not on file   Utilities: Not At Risk (05/06/2024)    Received from [facility] Utilities     In the past 12 months has the electric, gas, oil, or water company threatened to shut off services in your home?: No   Stress: Patient Declined (01/10/2024)    Received from [facility] - Occupational Stress Questionnaire     Feeling of Stress : Patient declined   Interpersonal Safety: Not At Risk (04/09/2024)    Interpersonal Safety     Unsafe Where You Currently Live: No     Physically Hurt by Anyone: No     Abused by Anyone: No   Substance Use: Not on file (06/12/2023)   Intimate Partner Violence: Not At Risk (05/06/2024)    Received from [facility]    Humiliation, Afraid, Rape, and Kick questionnaire     Within the last year, have you been afraid of your partner or ex-partner?: No     Within the last year, have you been humiliated or emotionally abused in other ways by your partner or ex-partner?: No     Within the last year, have you been kicked, hit, slapped, or otherwise physically hurt by your partner or ex-partner?: No     Within the last year, have you been raped or forced to have any kind of sexual activity by your partner or ex-partner?: No   Social Connections: Not on file   Financial Resource Strain: Patient Declined (01/10/2024)    Received from [facility]    Overall Financial Resource Strain (CARDIA)     Difficulty of Paying Living Expenses: Patient declined  Health Literacy: Not on file   Internet Connectivity: Internet connectivity concern unknown (11/30/2023)    Internet Connectivity     Do you have access to internet services: Yes     How do you connect to the internet: Not on file     Is your internet connection strong enough for you to watch video on your device without major problems?: Not on file     Do you have enough data to get through the month?: Not on file     Does at least one of the devices have a camera that you can use for video chat?: Not on file       Would you be willing to receive help with any of the needs that you have identified today? {Yes/No/Not applicable:93005}       Mary Hawkins, PharmD  [facility] Specialty Pharmacist         [1]   Current Outpatient Medications   Medication Sig Dispense Refill    XIFAXAN  550 mg Tab TAKE 1 TABLET (550 MG TOTAL) BY MOUTH 3 TIMES A DAY FOR 14 DAYS 42 tablet 0    acetaminophen  (TYLENOL ) 500 MG tablet Take 2 tablets (1,000 mg total) by mouth every six (6) hours as needed for pain.      albuterol  HFA 90 mcg/actuation inhaler Inhale 2 puffs every 4-6 hrs with spacer for wheezing 6.7 g 0    amitriptyline (ELAVIL) 75 MG tablet Take 1 tablet (75 mg total) by mouth. (Patient taking differently: Take 100 mg by mouth. 75=-100 mg)      aspirin -acetaminophen -caffeine  (EXCEDRIN  MIGRAINE) 250-250-65 mg per tablet Take 1 tablet by mouth.      azithromycin  (ZITHROMAX ) 500 MG tablet Take 1 tablet (500 mg total) by mouth two (2) times a day. 60 tablet 0    baclofen  (LIORESAL ) 20 MG tablet Take 1 tablet (20 mg total) by mouth four (4) times a day. (Patient taking differently: Take 1 tablet (20 mg total) by mouth four (4) times a day. PRN)      cetirizine (ZYRTEC) 10 MG tablet       chlorhexidine (PERIDEX) 0.12 % solution  (Patient taking differently: PRN)      cholecalciferol , vitD3,/vit K2 (VITAMIN D3-VITAMIN K2) 250 mcg (10,000 unit)-45 mcg cap Take by mouth. 100 mcg K2 (Patient taking differently: Take by mouth. To discuss)      colestipol  (COLESTID ) 1 gram tablet Take 1 tablet (1 g total) by mouth two (2) times a day. 60 tablet 3    cyanocobalamin , vitamin B-12, 1,000 mcg/mL injection Inject 1 mL (1,000 mcg total) under the skin every fourteen (14) days. 6 mL 2    cycloSPORINE  (RESTASIS ) 0.05 % ophthalmic emulsion Administer 1 drop to both eyes Two (2) times a day. 180 each 3    dextroamphetamine -amphetamine  (ADDERALL ) 20 mg tablet Take 1 tablet (20 mg total) by mouth two (2) times a day.      esomeprazole  (NEXIUM ) 40 MG capsule Take 1 capsule (40 mg total) by mouth two (2) times a day. 180 capsule 3    famotidine  (PEPCID ) 40 MG tablet Take 1 tablet (40 mg total) by mouth two (2) times a day. 90 tablet 3    gabapentin (NEURONTIN) 800 MG tablet Take 1 tablet (800 mg total) by mouth Four (4) times a day with a meal and nightly.      guselkumab  200 mg/2 mL PnIj Inject 200 mg under the skin every twenty-eight (28)  days. 2 mL 5    guselkumab  200 mg/2 mL PnIj Inject 400 mg under the skin every twenty-eight (28) days. INDUCTION DOSING 12 mL 0    naloxone  (NARCAN ) 4 mg nasal spray       nicotine  (NICODERM CQ ) 21 mg/24 hr patch APPLY 1 PATCH EVERY DAY 28 patch 2    NIFEdipine  (PROCARDIA  XL) 30 MG 24 hr tablet Take 1 tablet (30 mg total) by mouth daily. 90 tablet 0    ondansetron  (ZOFRAN -ODT) 8 MG disintegrating tablet Dissolve 1 tablet (8 mg total) on the tongue every eight (8) hours as needed for nausea. 60 tablet 4    oxyCODONE  (ROXICODONE ) 15 MG immediate release tablet  (Patient taking differently: PRN)      polyethylene glycol (GOLYTELY ) 236-22.74-6.74 gram solution Take by mouth as directed per [facility] GI prep instructions, for split bowel prep. 4000 mL 0    potassium chloride  (KLOR-CON  M20) 20 MEQ ER tablet Take 1 tablet (20 mEq total) by mouth daily. 30 tablet 5    risankizumab -rzaa (SKYRIZI ) 360 mg/2.4 mL (150 mg/mL) Injt Inject 360 mg with OBI every 8 weeks for maintenance. 2.4 mL 2    simethicone  (MYLICON) 80 MG chewable tablet Chew 1 tablet (80 mg total) every six (6) hours as needed for flatulence. 30 tablet 0    simethicone  (PHAZYME) 500 mg cap       sumatriptan  (IMITREX ) 25 MG tablet Take one tablet at the onset of headache and may repeat in 2 hrs if headache still present. Max dose 50 mg /day. 10 tablet 0    varenicline tartrate (CHANTIX PAK) 0.5 mg (11)- 1 mg (42) tablet FOLLOW STARTER PACK INSTRUCTIONS FOR SMOKING CESSATION       No current facility-administered medications for this visit.   [2]   Allergies  Allergen Reactions    Penicillins Hives     Has patient had a PCN reaction causing immediate rash, facial/tongue/throat swelling, SOB or lightheadedness with hypotension: Yes    Has patient had a PCN reaction causing severe rash involving mucus membranes or skin necrosis: Unk    Has patient had a PCN reaction that required hospitalization: Yes    Has patient had a PCN reaction occurring within the last 10 years: No    If all of the above answers are NO, then may proceed with Cephalosporin use.    Other Reaction(s): HIVES, SOB   [3]   Patient Active Problem List  Diagnosis    Generalized abdominal pain    Attention deficit hyperactivity disorder (ADHD)    Allergic rhinitis    Anxiety state    Cobalamin deficiency    Brachial neuritis    Drug dependence, continuous abuse    (CMS-HCC)    Headache    Migraine without aura    Regional enteritis of small intestine (CMS-HCC)    Endometriosis    Esophageal reflux    Essential hypertension    Thoracic or lumbosacral neuritis or radiculitis    Pain, joint, multiple sites    Neuralgia and neuritis    Tobacco abuse    Erosive esophagitis    Microcytic anemia    Crohn's disease of small intestine with other complication (CMS-HCC)    Infliximab (Remicade) long-term use    Myopia of both eyes with astigmatism    Uses contact lenses    Eczema    Chronic fatigue syndrome with fibromyalgia    Fibromyalgia    Chronic pain syndrome    Dry eye  Vitamin D  deficiency    Arthritis of facet joint of cervical spine    Acute bilateral low back pain with bilateral sciatica    Bilateral bunions    Chronic pain of lower extremity, bilateral    Chronic sacroiliac joint pain    Closed fracture of right hip requiring operative repair with delayed healing    Crohn's disease of small and large intestines (CMS-HCC)    DDD (degenerative disc disease), lumbar    Hypokalemia    Insomnia disorder    Intra-abdominal fluid collection    Leukocytosis    Lumbar radiculitis    Mild renal insufficiency    Neuritis or radiculitis due to rupture of lumbar intervertebral disc    Osteoarthritis    Plantar fasciitis, bilateral    Polysubstance abuse (CMS-HCC)    PTSD (post-traumatic stress disorder)    Radiculopathy    Raynaud's disease    Restless legs syndrome (RLS)    Thrombocytosis    Uncomplicated asthma (HHS-HCC)    Visual disturbances

## 2024-09-06 NOTE — Progress Notes (Signed)
 [facility] Specialty Medication Onboarding    Specialty Medication: Tremfya   Prior Authorization: Approved   Financial Assistance: No - copay  <$25  Copay/Day Supply: $4 / 84 days (LD & MD)    Insurance Restrictions: None     Notes to Pharmacist:   Credit Card on File: yes  Start Date on Rx:    Delivery Method (based on home address currently on file): UPS no restrictions      The triage team has completed the benefits investigation and has determined that the patient is able to fill this medication at [facility]. Please contact the patient to complete the onboarding or follow up with the prescribing physician as needed.

## 2024-09-13 ENCOUNTER — Ambulatory Visit (HOSPITAL_COMMUNITY): Admission: RE | Admit: 2024-09-13 | Source: Ambulatory Visit

## 2024-09-13 DIAGNOSIS — J329 Chronic sinusitis, unspecified: Secondary | ICD-10-CM

## 2024-10-01 ENCOUNTER — Ambulatory Visit
